# Patient Record
Sex: Male | Born: 1935 | Race: White | Hispanic: No | Marital: Married | Smoking: Former smoker
Health system: Southern US, Community
[De-identification: ages and names within clinical notes are randomized; demographics above are authoritative.]

## PROBLEM LIST (undated history)

## (undated) DIAGNOSIS — I2510 Atherosclerotic heart disease of native coronary artery without angina pectoris: Secondary | ICD-10-CM

## (undated) DIAGNOSIS — M5136 Other intervertebral disc degeneration, lumbar region: Secondary | ICD-10-CM

## (undated) DIAGNOSIS — J189 Pneumonia, unspecified organism: Secondary | ICD-10-CM

## (undated) DIAGNOSIS — D751 Secondary polycythemia: Secondary | ICD-10-CM

## (undated) DIAGNOSIS — J90 Pleural effusion, not elsewhere classified: Secondary | ICD-10-CM

## (undated) DIAGNOSIS — E039 Hypothyroidism, unspecified: Secondary | ICD-10-CM

## (undated) DIAGNOSIS — G4730 Sleep apnea, unspecified: Secondary | ICD-10-CM

## (undated) DIAGNOSIS — K219 Gastro-esophageal reflux disease without esophagitis: Secondary | ICD-10-CM

## (undated) DIAGNOSIS — G20 Parkinson's disease: Secondary | ICD-10-CM

## (undated) DIAGNOSIS — J8410 Pulmonary fibrosis, unspecified: Secondary | ICD-10-CM

## (undated) DIAGNOSIS — I480 Paroxysmal atrial fibrillation: Secondary | ICD-10-CM

## (undated) DIAGNOSIS — I5032 Chronic diastolic (congestive) heart failure: Secondary | ICD-10-CM

## (undated) DIAGNOSIS — M51369 Other intervertebral disc degeneration, lumbar region without mention of lumbar back pain or lower extremity pain: Secondary | ICD-10-CM

## (undated) DIAGNOSIS — M47812 Spondylosis without myelopathy or radiculopathy, cervical region: Secondary | ICD-10-CM

## (undated) DIAGNOSIS — M5030 Other cervical disc degeneration, unspecified cervical region: Secondary | ICD-10-CM

## (undated) DIAGNOSIS — E785 Hyperlipidemia, unspecified: Secondary | ICD-10-CM

## (undated) DIAGNOSIS — G20A1 Parkinson's disease without dyskinesia, without mention of fluctuations: Secondary | ICD-10-CM

## (undated) DIAGNOSIS — I10 Essential (primary) hypertension: Secondary | ICD-10-CM

## (undated) DIAGNOSIS — I700 Atherosclerosis of aorta: Secondary | ICD-10-CM

## (undated) DIAGNOSIS — F329 Major depressive disorder, single episode, unspecified: Secondary | ICD-10-CM

## (undated) DIAGNOSIS — D696 Thrombocytopenia, unspecified: Secondary | ICD-10-CM

## (undated) DIAGNOSIS — F32A Depression, unspecified: Secondary | ICD-10-CM

## (undated) DIAGNOSIS — I2699 Other pulmonary embolism without acute cor pulmonale: Secondary | ICD-10-CM

## (undated) HISTORY — DX: Sleep apnea, unspecified: G47.30

## (undated) HISTORY — DX: Spondylosis without myelopathy or radiculopathy, cervical region: M47.812

## (undated) HISTORY — DX: Parkinson's disease: G20

## (undated) HISTORY — DX: Paroxysmal atrial fibrillation: I48.0

## (undated) HISTORY — DX: Pulmonary fibrosis, unspecified: J84.10

## (undated) HISTORY — PX: CARDIAC CATHETERIZATION: SHX172

## (undated) HISTORY — DX: Essential (primary) hypertension: I10

## (undated) HISTORY — DX: Chronic diastolic (congestive) heart failure: I50.32

## (undated) HISTORY — DX: Other pulmonary embolism without acute cor pulmonale: I26.99

## (undated) HISTORY — DX: Secondary polycythemia: D75.1

## (undated) HISTORY — DX: Hyperlipidemia, unspecified: E78.5

## (undated) HISTORY — PX: CATARACT EXTRACTION: SUR2

## (undated) HISTORY — DX: Pleural effusion, not elsewhere classified: J90

## (undated) HISTORY — DX: Parkinson's disease without dyskinesia, without mention of fluctuations: G20.A1

## (undated) HISTORY — DX: Pneumonia, unspecified organism: J18.9

---

## 1958-04-18 HISTORY — PX: BACK SURGERY: SHX140

## 1996-04-18 HISTORY — PX: OTHER SURGICAL HISTORY: SHX169

## 1997-01-07 HISTORY — PX: CORONARY ARTERY BYPASS GRAFT: SHX141

## 2000-09-04 ENCOUNTER — Encounter: Admission: RE | Admit: 2000-09-04 | Discharge: 2000-09-04 | Payer: Self-pay | Admitting: Family Medicine

## 2000-09-05 ENCOUNTER — Encounter: Payer: Self-pay | Admitting: Sports Medicine

## 2000-09-05 ENCOUNTER — Encounter: Admission: RE | Admit: 2000-09-05 | Discharge: 2000-09-05 | Payer: Self-pay | Admitting: Sports Medicine

## 2000-09-15 ENCOUNTER — Encounter: Admission: RE | Admit: 2000-09-15 | Discharge: 2000-09-15 | Payer: Self-pay | Admitting: Sports Medicine

## 2000-10-03 ENCOUNTER — Encounter: Admission: RE | Admit: 2000-10-03 | Discharge: 2000-10-03 | Payer: Self-pay | Admitting: Sports Medicine

## 2000-10-06 ENCOUNTER — Encounter: Admission: RE | Admit: 2000-10-06 | Discharge: 2000-10-06 | Payer: Self-pay | Admitting: Family Medicine

## 2003-08-18 ENCOUNTER — Other Ambulatory Visit: Payer: Self-pay

## 2005-10-03 ENCOUNTER — Ambulatory Visit: Payer: Self-pay | Admitting: Gastroenterology

## 2006-03-07 ENCOUNTER — Ambulatory Visit: Payer: Self-pay | Admitting: Gastroenterology

## 2006-04-24 ENCOUNTER — Ambulatory Visit: Payer: Self-pay | Admitting: Gastroenterology

## 2006-10-09 ENCOUNTER — Ambulatory Visit: Payer: Self-pay | Admitting: Gastroenterology

## 2007-08-13 ENCOUNTER — Ambulatory Visit: Payer: Self-pay | Admitting: Otolaryngology

## 2008-03-04 DIAGNOSIS — C4491 Basal cell carcinoma of skin, unspecified: Secondary | ICD-10-CM

## 2008-03-04 HISTORY — DX: Basal cell carcinoma of skin, unspecified: C44.91

## 2008-04-18 HISTORY — PX: CHOLECYSTECTOMY: SHX55

## 2008-05-29 ENCOUNTER — Ambulatory Visit: Payer: Self-pay | Admitting: Gastroenterology

## 2008-06-11 ENCOUNTER — Inpatient Hospital Stay: Payer: Self-pay | Admitting: Endocrinology

## 2008-06-26 ENCOUNTER — Ambulatory Visit: Payer: Self-pay | Admitting: Endocrinology

## 2009-04-18 ENCOUNTER — Ambulatory Visit: Payer: Self-pay | Admitting: Internal Medicine

## 2009-04-18 DIAGNOSIS — I2699 Other pulmonary embolism without acute cor pulmonale: Secondary | ICD-10-CM

## 2009-04-18 HISTORY — DX: Other pulmonary embolism without acute cor pulmonale: I26.99

## 2009-05-11 ENCOUNTER — Ambulatory Visit: Payer: Self-pay | Admitting: Internal Medicine

## 2009-05-19 ENCOUNTER — Ambulatory Visit: Payer: Self-pay | Admitting: Internal Medicine

## 2009-06-16 ENCOUNTER — Ambulatory Visit: Payer: Self-pay | Admitting: Internal Medicine

## 2009-06-19 ENCOUNTER — Ambulatory Visit: Payer: Self-pay | Admitting: Internal Medicine

## 2009-07-17 ENCOUNTER — Ambulatory Visit: Payer: Self-pay | Admitting: Internal Medicine

## 2009-08-16 ENCOUNTER — Ambulatory Visit: Payer: Self-pay | Admitting: Internal Medicine

## 2009-09-16 ENCOUNTER — Ambulatory Visit: Payer: Self-pay | Admitting: Internal Medicine

## 2009-10-27 ENCOUNTER — Inpatient Hospital Stay: Payer: Self-pay | Admitting: Internal Medicine

## 2009-10-29 ENCOUNTER — Ambulatory Visit: Payer: Self-pay | Admitting: Internal Medicine

## 2009-11-16 ENCOUNTER — Ambulatory Visit: Payer: Self-pay | Admitting: Internal Medicine

## 2009-12-17 ENCOUNTER — Ambulatory Visit: Payer: Self-pay | Admitting: Internal Medicine

## 2010-01-12 ENCOUNTER — Ambulatory Visit: Payer: Self-pay | Admitting: Gastroenterology

## 2010-01-13 ENCOUNTER — Ambulatory Visit: Payer: Self-pay | Admitting: Internal Medicine

## 2010-01-13 LAB — PATHOLOGY REPORT

## 2010-01-16 ENCOUNTER — Ambulatory Visit: Payer: Self-pay | Admitting: Internal Medicine

## 2010-02-16 ENCOUNTER — Ambulatory Visit: Payer: Self-pay | Admitting: Internal Medicine

## 2010-03-18 ENCOUNTER — Ambulatory Visit: Payer: Self-pay | Admitting: Internal Medicine

## 2010-04-18 ENCOUNTER — Ambulatory Visit: Payer: Self-pay | Admitting: Internal Medicine

## 2010-05-19 ENCOUNTER — Ambulatory Visit: Payer: Self-pay | Admitting: Internal Medicine

## 2010-06-17 ENCOUNTER — Ambulatory Visit: Payer: Self-pay | Admitting: Internal Medicine

## 2010-07-28 ENCOUNTER — Ambulatory Visit: Payer: Self-pay | Admitting: Internal Medicine

## 2010-08-17 ENCOUNTER — Ambulatory Visit: Payer: Self-pay | Admitting: Internal Medicine

## 2010-09-17 ENCOUNTER — Ambulatory Visit: Payer: Self-pay | Admitting: Internal Medicine

## 2010-10-27 ENCOUNTER — Ambulatory Visit: Payer: Self-pay | Admitting: Internal Medicine

## 2010-11-17 ENCOUNTER — Ambulatory Visit: Payer: Self-pay | Admitting: Internal Medicine

## 2011-01-19 ENCOUNTER — Ambulatory Visit: Payer: Self-pay | Admitting: Internal Medicine

## 2011-02-17 ENCOUNTER — Ambulatory Visit: Payer: Self-pay | Admitting: Internal Medicine

## 2011-04-06 ENCOUNTER — Ambulatory Visit: Payer: Self-pay | Admitting: Internal Medicine

## 2011-04-19 ENCOUNTER — Ambulatory Visit: Payer: Self-pay | Admitting: Internal Medicine

## 2011-06-29 ENCOUNTER — Ambulatory Visit: Payer: Self-pay | Admitting: Internal Medicine

## 2011-06-29 LAB — CANCER CENTER HEMATOCRIT: HCT: 45.8 % (ref 40.0–52.0)

## 2011-07-18 ENCOUNTER — Ambulatory Visit: Payer: Self-pay | Admitting: Internal Medicine

## 2011-09-20 ENCOUNTER — Ambulatory Visit: Payer: Self-pay | Admitting: Internal Medicine

## 2011-09-20 LAB — CBC CANCER CENTER
Basophil #: 0 x10 3/mm (ref 0.0–0.1)
Basophil %: 0.4 %
Eosinophil #: 0.1 x10 3/mm (ref 0.0–0.7)
Eosinophil %: 0.7 %
HCT: 47.1 % (ref 40.0–52.0)
HGB: 14.7 g/dL (ref 13.0–18.0)
Lymphocyte #: 2.3 x10 3/mm (ref 1.0–3.6)
Lymphocyte %: 22.5 %
MCH: 27.3 pg (ref 26.0–34.0)
MCHC: 31.2 g/dL — ABNORMAL LOW (ref 32.0–36.0)
MCV: 88 fL (ref 80–100)
Monocyte #: 0.9 x10 3/mm (ref 0.2–1.0)
Monocyte %: 8.9 %
Neutrophil #: 6.9 x10 3/mm — ABNORMAL HIGH (ref 1.4–6.5)
Neutrophil %: 67.5 %
Platelet: 175 x10 3/mm (ref 150–440)
RBC: 5.38 10*6/uL (ref 4.40–5.90)
RDW: 16.1 % — ABNORMAL HIGH (ref 11.5–14.5)
WBC: 10.2 x10 3/mm (ref 3.8–10.6)

## 2011-09-20 LAB — HEPATIC FUNCTION PANEL A (ARMC)
Albumin: 3.7 g/dL (ref 3.4–5.0)
Alkaline Phosphatase: 133 U/L (ref 50–136)
Bilirubin, Direct: 0.1 mg/dL (ref 0.00–0.20)
Bilirubin,Total: 0.5 mg/dL (ref 0.2–1.0)
SGOT(AST): 18 U/L (ref 15–37)
SGPT (ALT): 23 U/L
Total Protein: 7.6 g/dL (ref 6.4–8.2)

## 2011-09-20 LAB — CREATININE, SERUM
Creatinine: 1.02 mg/dL (ref 0.60–1.30)
EGFR (African American): >60
EGFR (Non-African Amer.): >60

## 2011-10-17 ENCOUNTER — Ambulatory Visit: Payer: Self-pay | Admitting: Internal Medicine

## 2011-12-06 ENCOUNTER — Ambulatory Visit: Payer: Self-pay | Admitting: Family Medicine

## 2011-12-06 LAB — CREATININE, SERUM
Creatinine: 0.95 mg/dL (ref 0.60–1.30)
EGFR (African American): >60
EGFR (Non-African Amer.): >60

## 2012-01-25 ENCOUNTER — Ambulatory Visit: Payer: Self-pay | Admitting: Internal Medicine

## 2012-01-25 LAB — CANCER CENTER HEMATOCRIT: HCT: 46.9 % (ref 40.0–52.0)

## 2012-02-17 ENCOUNTER — Ambulatory Visit: Payer: Self-pay | Admitting: Internal Medicine

## 2012-04-18 ENCOUNTER — Ambulatory Visit: Payer: Self-pay | Admitting: Internal Medicine

## 2012-05-01 LAB — CANCER CENTER HEMATOCRIT: HCT: 46.2 % (ref 40.0–52.0)

## 2012-05-19 ENCOUNTER — Ambulatory Visit: Payer: Self-pay | Admitting: Internal Medicine

## 2012-08-20 ENCOUNTER — Ambulatory Visit: Payer: Self-pay | Admitting: Internal Medicine

## 2012-08-21 LAB — CBC CANCER CENTER
Basophil #: 0.1 x10 3/mm (ref 0.0–0.1)
Basophil %: 0.9 %
Eosinophil #: 0.1 x10 3/mm (ref 0.0–0.7)
Eosinophil %: 0.7 %
HCT: 42.7 % (ref 40.0–52.0)
HGB: 13.8 g/dL (ref 13.0–18.0)
Lymphocyte #: 2.1 x10 3/mm (ref 1.0–3.6)
Lymphocyte %: 17.5 %
MCH: 27 pg (ref 26.0–34.0)
MCHC: 32.2 g/dL (ref 32.0–36.0)
MCV: 84 fL (ref 80–100)
Monocyte #: 1.1 x10 3/mm — ABNORMAL HIGH (ref 0.2–1.0)
Monocyte %: 9.5 %
Neutrophil #: 8.5 x10 3/mm — ABNORMAL HIGH (ref 1.4–6.5)
Neutrophil %: 71.4 %
Platelet: 154 x10 3/mm (ref 150–440)
RBC: 5.1 10*6/uL (ref 4.40–5.90)
RDW: 16.4 % — ABNORMAL HIGH (ref 11.5–14.5)
WBC: 11.9 x10 3/mm — ABNORMAL HIGH (ref 3.8–10.6)

## 2012-09-04 ENCOUNTER — Ambulatory Visit (INDEPENDENT_AMBULATORY_CARE_PROVIDER_SITE_OTHER): Payer: Medicare Other | Admitting: Emergency Medicine

## 2012-09-04 ENCOUNTER — Encounter: Payer: Self-pay | Admitting: Emergency Medicine

## 2012-09-04 ENCOUNTER — Telehealth: Payer: Self-pay | Admitting: Emergency Medicine

## 2012-09-04 VITALS — BP 122/60 | HR 68 | Temp 97.4°F | Ht 67.0 in | Wt 265.8 lb

## 2012-09-04 DIAGNOSIS — J309 Allergic rhinitis, unspecified: Secondary | ICD-10-CM

## 2012-09-04 DIAGNOSIS — R0609 Other forms of dyspnea: Secondary | ICD-10-CM

## 2012-09-04 DIAGNOSIS — R0989 Other specified symptoms and signs involving the circulatory and respiratory systems: Secondary | ICD-10-CM

## 2012-09-04 DIAGNOSIS — R059 Cough, unspecified: Secondary | ICD-10-CM

## 2012-09-04 DIAGNOSIS — G4733 Obstructive sleep apnea (adult) (pediatric): Secondary | ICD-10-CM

## 2012-09-04 DIAGNOSIS — R0600 Dyspnea, unspecified: Secondary | ICD-10-CM

## 2012-09-04 DIAGNOSIS — R05 Cough: Secondary | ICD-10-CM

## 2012-09-04 NOTE — Telephone Encounter (Signed)
Duplicate message see earlier phone note 

## 2012-09-04 NOTE — Patient Instructions (Addendum)
Please stop pulmicort Continue ventolin 2 puffs as needed Increase fluticasone (Flonase) to 2 sprays each nostril twice a day Increase your azelastine to 2 sprays 2 -3 times a day.  Continue zyrtec.  Walking oximetry today We will perform full pulmonary function testing at your next office visit Continue your CPAP  - your settings should be 16-17. We will [facility] an order to [facility] in [place].  Follow with Dr Delton Coombes next available with full PFT.

## 2012-09-04 NOTE — Telephone Encounter (Signed)
Pt currently has an auto BiPap s8 which will not show the Insp and Esp rate on the report. Just shows the maximum pressure used.  Andre [name] is currently set on an Auto BiPap with a range of 10-18. Lisa at [facility] needs to know what pressure Dr. Delton Coombes wants her to change the BiPap rate to? Patient came in and was upset at [facility] at [facility] that she couldn't change pressure based off of our order. Lisa at [facility] wanted to know if you wanted to leave his BiPap on Auto of 10-18. Need a new order to reflect BiPap pressure setting.  Also pt currently see Dr. Andee Poles (ENT physician in [place]) who had ordered an ONO on BiPap to make sure that the patient didn't require supplemental o2 with BiPap. Misty Stanley would like to know if you wanted this arranged as well? Please advise. Andre [name]

## 2012-09-04 NOTE — Telephone Encounter (Signed)
I spoke with spouse as they called back. They state Misty Stanley won;t change the order bc it states CPAP and pt has VPAP. I advised him we are working on this and will let him know when we get everything straightened out. Spouse and pt voiced there understanding.   I spoke with Bjorn Loser. She states the pt took her machine over to [facility] and since they were not open he just left it in the tree and he left. Pt then called [facility] once they opened to let them know. Since pt is already on auto setting 10-18 does Dr. Delton Coombes still want to put pt on another auto setting 16-17 since he is already getting this pressure with his current auto setting. Please advise RB thanks

## 2012-09-04 NOTE — Telephone Encounter (Signed)
Please advise Dr. Byrum thanks 

## 2012-09-04 NOTE — Progress Notes (Signed)
Subjective:    Patient ID: Andre [name], male    DOB: 1935-08-29, 77 y.o.   MRN: [id number]  HPI 77 yo former smoker (10-15 pk-yrs), hx CAD/CABG, HTN, allergies on immunotherapy, OSA on CPAP, prior PE. He is referred by Dr Jenne Campus w ENT for dyspnea. He reports a tickle in throat and cough about 6 mo ago. He ascribed it to his allergies, started on veramyst, astelin, zyrtec. He has also had progressive SOB, especially with exertion, talking, when he is nervous. On at least one occasion he was treated for an AE with prednisone, BD's. He felt that this helped him, but then right back to same place after the pred was finished. He was also started on pulmicort + albuterol. Was seen by Dr Mayo Ao and apparently had PFT. He has also been treated for edema with lasix. He also had a cardiac eval at [facility] that he reports was reassuring. He has been gaining wt, at least 35 lbs over the last year.    Review of Systems  Constitutional: Positive for activity change and appetite change. Negative for fever and unexpected weight change.  HENT: Positive for congestion and trouble swallowing. Negative for ear pain, nosebleeds, sore throat, rhinorrhea, sneezing, dental problem, postnasal drip and sinus pressure.   Eyes: Negative for redness and itching.  Respiratory: Positive for cough, chest tightness and shortness of breath. Negative for wheezing.   Cardiovascular: Positive for leg swelling. Negative for palpitations.  Gastrointestinal: Negative for nausea and vomiting.  Genitourinary: Negative for dysuria.  Musculoskeletal: Negative for joint swelling.  Skin: Negative for rash.  Neurological: Positive for dizziness and light-headedness. Negative for headaches.  Hematological: Does not bruise/bleed easily.  Psychiatric/Behavioral: Positive for dysphoric mood. The patient is nervous/anxious.    Past Medical History  Diagnosis Date  . Sleep apnea     wears CPAP  . High cholesterol   . Sinus trouble   .  Hypertension   . Parkinson's disease     tremors  . Pulmonary embolism 2011     No family history on file.   History   Social History  . Marital Status: Married    Spouse Name: N/A    Number of Children: 2  . Years of Education: N/A   Occupational History  . Retired     Economist   Social History Main Topics  . Smoking status: Former Smoker -- 1.00 packs/day for 10 years    Types: Cigarettes, Pipe, Cigars    Quit date: 04/18/1972  . Smokeless tobacco: Former Neurosurgeon    Types: Chew    Quit date: 04/18/1972  . Alcohol Use: 0.0 oz/week     Comment: 2 drinks per day  . Drug Use: No  . Sexually Active: Not on file   Other Topics Concern  . Not on file   Social History Narrative  . No narrative on file     No Known Allergies   No outpatient prescriptions prior to visit.   No facility-administered medications prior to visit.         Objective:   Physical Exam Filed Vitals:   09/04/12 1131  BP: 122/60  Pulse: 68  Temp: 97.4 F (36.3 C)  TempSrc: Oral  Height: 5\' 7"  (1.702 m)  Weight: 265 lb 12.8 oz (120.566 kg)  SpO2: 95%   Gen: Pleasant, well-nourished, in no distress,  normal affect  ENT: No lesions,  mouth clear,  oropharynx clear, no postnasal drip  Neck: No JVD, no TMG, no  carotid bruits  Lungs: No use of accessory muscles, no dullness to percussion, clear without rales or rhonchi  Cardiovascular: RRR, heart sounds normal, no murmur or gallops, no peripheral edema  Abdomen: obese, protuberant, non-tender.   Musculoskeletal: No deformities, no cyanosis or clubbing  Neuro: alert, non focal  Skin: Warm, no lesions or rashes      Assessment & Plan:  Dyspnea Etiology unclear - has had a reassuring cardiac eval per his report. Suspect multifactorial, including UA disease w his cough, wt gain. Consider also true AFL - full PFt - walking oximetry - Attempt to treat his allergies and factor influencing cough - stop pulmicort -  Continue ventolin 2 puffs as needed - Continue your CPAP  - your settings should be 16-17. We will send an order to [facility] in [place].     Cough Increase fluticasone (Flonase) to 2 sprays each nostril twice a day Increase your azelastine to 2 sprays 2 -3 times a day.  Continue zyrtec.

## 2012-09-05 NOTE — Telephone Encounter (Signed)
The patient didn't know what he was set on. Based on the down;load that I received I needed to insure that he was set correctly. It looks like we can just leave it as is.

## 2012-09-05 NOTE — Telephone Encounter (Signed)
I called and spoke Andre [name] and made her aware.  I called pt and LMTCB X1

## 2012-09-06 ENCOUNTER — Institutional Professional Consult (permissible substitution): Payer: Self-pay | Admitting: Critical Care Medicine

## 2012-09-06 NOTE — Telephone Encounter (Signed)
LMTCBx2 to advise the pt that we will keep settings on his VPAP the same. Carron Curie, CMA

## 2012-09-07 ENCOUNTER — Telehealth: Payer: Self-pay | Admitting: Emergency Medicine

## 2012-09-07 NOTE — Telephone Encounter (Signed)
Received 1 page from [facility], sent to Dr. Delton Coombes. 09/07/12/ss

## 2012-09-07 NOTE — Telephone Encounter (Signed)
LMTCBx3. To advise the pt that we will keep settings on his vpap the same. Carron Curie, CMA

## 2012-09-10 DIAGNOSIS — G4733 Obstructive sleep apnea (adult) (pediatric): Secondary | ICD-10-CM | POA: Insufficient documentation

## 2012-09-10 DIAGNOSIS — R059 Cough, unspecified: Secondary | ICD-10-CM | POA: Insufficient documentation

## 2012-09-10 DIAGNOSIS — J309 Allergic rhinitis, unspecified: Secondary | ICD-10-CM | POA: Insufficient documentation

## 2012-09-10 DIAGNOSIS — R0600 Dyspnea, unspecified: Secondary | ICD-10-CM | POA: Insufficient documentation

## 2012-09-10 DIAGNOSIS — R05 Cough: Secondary | ICD-10-CM | POA: Insufficient documentation

## 2012-09-10 NOTE — Assessment & Plan Note (Signed)
Increase fluticasone (Flonase) to 2 sprays each nostril twice a day Increase your azelastine to 2 sprays 2 -3 times a day.  Continue zyrtec.

## 2012-09-10 NOTE — Assessment & Plan Note (Addendum)
Etiology unclear - has had a reassuring cardiac eval per his report. Suspect multifactorial, including UA disease w his cough, wt gain. Consider also true AFL - full PFt - walking oximetry - Attempt to treat his allergies and factor influencing cough - stop pulmicort - Continue ventolin 2 puffs as needed - Continue your CPAP  - your settings should be 16-17. We will send an order to [facility] in [place].

## 2012-09-11 NOTE — Telephone Encounter (Signed)
LMTCBx4. Per protocol if pt does not return call will close the message. Carron Curie, CMA

## 2012-09-11 NOTE — Telephone Encounter (Signed)
Pt returned triage's call & asked to be reached at [phone].  Andre [name]

## 2012-09-11 NOTE — Telephone Encounter (Signed)
Pt advised. Jennifer Castillo, CMA  

## 2012-09-16 ENCOUNTER — Ambulatory Visit: Payer: Self-pay | Admitting: Internal Medicine

## 2012-09-25 ENCOUNTER — Ambulatory Visit (INDEPENDENT_AMBULATORY_CARE_PROVIDER_SITE_OTHER): Payer: Medicare Other | Admitting: Emergency Medicine

## 2012-09-25 DIAGNOSIS — R0989 Other specified symptoms and signs involving the circulatory and respiratory systems: Secondary | ICD-10-CM

## 2012-09-25 DIAGNOSIS — R0609 Other forms of dyspnea: Secondary | ICD-10-CM

## 2012-09-25 DIAGNOSIS — R0600 Dyspnea, unspecified: Secondary | ICD-10-CM

## 2012-09-25 LAB — PULMONARY FUNCTION TEST

## 2012-09-25 NOTE — Progress Notes (Signed)
PFT done today. 

## 2012-09-26 ENCOUNTER — Encounter: Payer: Self-pay | Admitting: Emergency Medicine

## 2012-09-27 ENCOUNTER — Ambulatory Visit: Payer: Self-pay | Admitting: Unknown Physician Specialty

## 2012-09-28 ENCOUNTER — Encounter: Payer: Self-pay | Admitting: Emergency Medicine

## 2012-09-28 ENCOUNTER — Ambulatory Visit (INDEPENDENT_AMBULATORY_CARE_PROVIDER_SITE_OTHER): Payer: Medicare Other | Admitting: Emergency Medicine

## 2012-09-28 VITALS — BP 116/70 | HR 65 | Temp 98.1°F | Ht 66.0 in | Wt 267.4 lb

## 2012-09-28 DIAGNOSIS — R0609 Other forms of dyspnea: Secondary | ICD-10-CM

## 2012-09-28 DIAGNOSIS — G4733 Obstructive sleep apnea (adult) (pediatric): Secondary | ICD-10-CM

## 2012-09-28 DIAGNOSIS — R0989 Other specified symptoms and signs involving the circulatory and respiratory systems: Secondary | ICD-10-CM

## 2012-09-28 DIAGNOSIS — R0600 Dyspnea, unspecified: Secondary | ICD-10-CM

## 2012-09-28 NOTE — Patient Instructions (Addendum)
We will continue your veramyst and zyrtec We will not start a new inhaler at this time Continue to use albuterol as needed.  We will review your cardiac stress testing We need to confirm that your BiPAP machine has the right settings based on your recent titration study.  Follow with Dr Delton Coombes in 3 months or sooner if you have any problems.

## 2012-09-28 NOTE — Progress Notes (Signed)
Subjective:    Patient ID: Andre [name], male    DOB: April 19, 1935, 77 y.o.   MRN: [id number]  HPI 77 yo former smoker (10-15 pk-yrs), hx CAD/CABG, HTN, allergies on immunotherapy, OSA on CPAP, prior PE. He is referred by Dr Jenne Campus w ENT for dyspnea. He reports a tickle in throat and cough about 6 mo ago. He ascribed it to his allergies, started on veramyst, astelin, zyrtec. He has also had progressive SOB, especially with exertion, talking, when he is nervous. On at least one occasion he was treated for an AE with prednisone, BD's. He felt that this helped him, but then right back to same place after the pred was finished. He was also started on pulmicort + albuterol. Was seen by Dr Mayo Ao and apparently had PFT. He has also been treated for edema with lasix. He also had a cardiac eval at [facility] that he reports was reassuring. He has been gaining wt, at least 35 lbs over the last year.   ROV 09/28/12 -- f/u for SOB, cough. He has undergone nuclear stress testing and full PFT >> show mixed restriction and obstruction, volumes confirm restriction, DLCO corrects for Va. He also had a repeat BiPAP titration last night, results pending. He remains fatigued, remains SOB. His nose is less congested than last time. He is unsure whether albuterol helps him in any way.     PULMONARY FUNCTON TEST 09/28/2012  FVC 2.37  FEV1 1.82  FEV1/FVC 76.8  FVC  % Predicted 63  FEV % Predicted 67  FeF 25-75 2.37  FeF 25-75 % Predicted 3.5    Review of Systems  Constitutional: Positive for activity change and appetite change. Negative for fever and unexpected weight change.  HENT: Positive for congestion and trouble swallowing. Negative for ear pain, nosebleeds, sore throat, rhinorrhea, sneezing, dental problem, postnasal drip and sinus pressure.   Eyes: Negative for redness and itching.  Respiratory: Positive for cough, chest tightness and shortness of breath. Negative for wheezing.   Cardiovascular: Positive for  leg swelling. Negative for palpitations.  Gastrointestinal: Negative for nausea and vomiting.  Genitourinary: Negative for dysuria.  Musculoskeletal: Negative for joint swelling.  Skin: Negative for rash.  Neurological: Positive for dizziness and light-headedness. Negative for headaches.  Hematological: Does not bruise/bleed easily.  Psychiatric/Behavioral: Positive for dysphoric mood. The patient is nervous/anxious.    Past Medical History  Diagnosis Date  . Sleep apnea     wears CPAP  . High cholesterol   . Sinus trouble   . Hypertension   . Parkinson's disease     tremors  . Pulmonary embolism 2011     No family history on file.   History   Social History  . Marital Status: Married    Spouse Name: N/A    Number of Children: 2  . Years of Education: N/A   Occupational History  . Retired     Economist   Social History Main Topics  . Smoking status: Former Smoker -- 1.00 packs/day for 10 years    Types: Cigarettes, Pipe, Cigars    Quit date: 04/18/1972  . Smokeless tobacco: Former Neurosurgeon    Types: Chew    Quit date: 04/18/1972  . Alcohol Use: 0.0 oz/week     Comment: 2 drinks per day  . Drug Use: No  . Sexually Active: Not on file   Other Topics Concern  . Not on file   Social History Narrative  . No narrative on file  No Known Allergies   Outpatient Prescriptions Prior to Visit  Medication Sig Dispense Refill  . albuterol (VENTOLIN HFA) 108 (90 BASE) MCG/ACT inhaler Inhale 2 puffs into the lungs every 4 (four) hours as needed for wheezing.      [name] amLODipine (NORVASC) 10 MG tablet Take 10 mg by mouth daily.      [name] aspirin 325 MG tablet Take 325 mg by mouth daily.      [name] azelastine (ASTELIN) 137 MCG/SPRAY nasal spray Place 2 sprays into the nose daily. Use in each nostril as directed      . b complex vitamins tablet Take 1 tablet by mouth daily.      . carbidopa-levodopa (PARCOPA) 25-250 MG per disintegrating tablet Take 2 tablets by mouth 3  (three) times daily.      . cetirizine (ZYRTEC) 10 MG tablet Take 10 mg by mouth daily.      [name] esomeprazole (NEXIUM) 40 MG capsule Take 40 mg by mouth daily before breakfast.      . fluticasone (VERAMYST) 27.5 MCG/SPRAY nasal spray Place 2 sprays into the nose daily.      . hydrochlorothiazide (HYDRODIURIL) 25 MG tablet Take 25 mg by mouth daily.      [name] lovastatin (MEVACOR) 40 MG tablet Take 40 mg by mouth daily.      . metFORMIN (GLUCOPHAGE) 500 MG tablet Take 500 mg by mouth daily.      . tadalafil (CIALIS) 5 MG tablet Take 5 mg by mouth daily.      . tamsulosin (FLOMAX) 0.4 MG CAPS Take 0.4 mg by mouth daily.      . budesonide (PULMICORT) 180 MCG/ACT inhaler Inhale 2 puffs into the lungs 2 (two) times daily.      [name] FLUoxetine (PROZAC) 40 MG capsule Take 40 mg by mouth daily.       No facility-administered medications prior to visit.       Objective:   Physical Exam Filed Vitals:   09/28/12 1605  BP: 116/70  Pulse: 65  Temp: 98.1 F (36.7 C)  TempSrc: Oral  Height: 5\' 6"  (1.676 m)  Weight: 267 lb 6.4 oz (121.292 kg)  SpO2: 92%   Gen: Pleasant, well-nourished, in no distress,  normal affect  ENT: No lesions,  mouth clear,  oropharynx clear, no postnasal drip  Neck: No JVD, no TMG, no carotid bruits  Lungs: No use of accessory muscles, no dullness to percussion, clear without rales or rhonchi  Cardiovascular: RRR, heart sounds normal, no murmur or gallops, no peripheral edema  Abdomen: obese, protuberant, non-tender.   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 He does not want to start a new BD right now, will stick with albuterol prn. Suspect that nasal obstruction was part of his dyspnea as well - will  continue the allergy regimen. He has a cardiac stress test pending at [facility]. We will get the results. ROV 3 months.

## 2012-09-28 NOTE — Assessment & Plan Note (Signed)
Need to obtain BiPAP titration study to reset his machine

## 2012-09-28 NOTE — Assessment & Plan Note (Signed)
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 He does not want to start a new BD right now, will stick with albuterol prn. Suspect that nasal obstruction was part of his dyspnea as well - will continue the allergy regimen. He has a cardiac stress test pending at [facility]. We will get the results. ROV 3 months.

## 2012-10-03 ENCOUNTER — Telehealth: Payer: Self-pay | Admitting: Emergency Medicine

## 2012-10-03 NOTE — Telephone Encounter (Signed)
rec'd from [facility] forward 5 pages to [facility]

## 2012-10-30 DIAGNOSIS — E669 Obesity, unspecified: Secondary | ICD-10-CM | POA: Insufficient documentation

## 2012-10-30 DIAGNOSIS — I10 Essential (primary) hypertension: Secondary | ICD-10-CM | POA: Insufficient documentation

## 2012-10-30 DIAGNOSIS — K219 Gastro-esophageal reflux disease without esophagitis: Secondary | ICD-10-CM | POA: Insufficient documentation

## 2012-10-30 DIAGNOSIS — R002 Palpitations: Secondary | ICD-10-CM | POA: Insufficient documentation

## 2012-10-30 DIAGNOSIS — I25119 Atherosclerotic heart disease of native coronary artery with unspecified angina pectoris: Secondary | ICD-10-CM | POA: Insufficient documentation

## 2012-10-30 DIAGNOSIS — E785 Hyperlipidemia, unspecified: Secondary | ICD-10-CM | POA: Insufficient documentation

## 2012-10-30 DIAGNOSIS — I2510 Atherosclerotic heart disease of native coronary artery without angina pectoris: Secondary | ICD-10-CM | POA: Insufficient documentation

## 2012-10-30 DIAGNOSIS — Z6835 Body mass index (BMI) 35.0-35.9, adult: Secondary | ICD-10-CM | POA: Insufficient documentation

## 2012-12-05 ENCOUNTER — Telehealth: Payer: Self-pay | Admitting: Emergency Medicine

## 2012-12-05 NOTE — Telephone Encounter (Signed)
pt states he does not need to see RB at this time, and will call when ready.

## 2012-12-10 ENCOUNTER — Ambulatory Visit: Payer: Self-pay | Admitting: Internal Medicine

## 2012-12-11 LAB — CANCER CENTER HEMATOCRIT: HCT: 44.7 % (ref 40.0–52.0)

## 2012-12-17 ENCOUNTER — Ambulatory Visit: Payer: Self-pay | Admitting: Internal Medicine

## 2013-03-07 ENCOUNTER — Ambulatory Visit: Payer: Self-pay

## 2013-03-18 ENCOUNTER — Ambulatory Visit: Payer: Self-pay

## 2013-04-02 ENCOUNTER — Ambulatory Visit: Payer: Self-pay | Admitting: Internal Medicine

## 2013-04-02 LAB — CANCER CENTER HEMATOCRIT: HCT: 49.8 % (ref 40.0–52.0)

## 2013-04-18 ENCOUNTER — Ambulatory Visit: Payer: Self-pay | Admitting: Internal Medicine

## 2013-07-26 ENCOUNTER — Ambulatory Visit: Payer: Self-pay | Admitting: Internal Medicine

## 2013-07-29 LAB — CBC CANCER CENTER
Basophil #: 0.1 x10 3/mm (ref 0.0–0.1)
Basophil %: 1 %
Eosinophil #: 0.1 x10 3/mm (ref 0.0–0.7)
Eosinophil %: 1.3 %
HCT: 49.8 % (ref 40.0–52.0)
HGB: 15.7 g/dL (ref 13.0–18.0)
Lymphocyte #: 2.2 x10 3/mm (ref 1.0–3.6)
Lymphocyte %: 26.5 %
MCH: 27.5 pg (ref 26.0–34.0)
MCHC: 31.6 g/dL — ABNORMAL LOW (ref 32.0–36.0)
MCV: 87 fL (ref 80–100)
Monocyte #: 0.7 x10 3/mm (ref 0.2–1.0)
Monocyte %: 8.7 %
Neutrophil #: 5.2 x10 3/mm (ref 1.4–6.5)
Neutrophil %: 62.5 %
Platelet: 117 x10 3/mm — ABNORMAL LOW (ref 150–440)
RBC: 5.73 10*6/uL (ref 4.40–5.90)
RDW: 17 % — ABNORMAL HIGH (ref 11.5–14.5)
WBC: 8.4 x10 3/mm (ref 3.8–10.6)

## 2013-08-16 ENCOUNTER — Ambulatory Visit: Payer: Self-pay | Admitting: Internal Medicine

## 2013-08-22 ENCOUNTER — Ambulatory Visit: Payer: Self-pay | Admitting: General Practice

## 2013-09-18 ENCOUNTER — Ambulatory Visit: Payer: Self-pay | Admitting: General Practice

## 2013-10-01 DIAGNOSIS — M5030 Other cervical disc degeneration, unspecified cervical region: Secondary | ICD-10-CM | POA: Insufficient documentation

## 2013-10-01 DIAGNOSIS — M5412 Radiculopathy, cervical region: Secondary | ICD-10-CM | POA: Insufficient documentation

## 2013-10-01 DIAGNOSIS — M62838 Other muscle spasm: Secondary | ICD-10-CM | POA: Insufficient documentation

## 2013-10-01 DIAGNOSIS — M47812 Spondylosis without myelopathy or radiculopathy, cervical region: Secondary | ICD-10-CM

## 2013-10-01 DIAGNOSIS — M5136 Other intervertebral disc degeneration, lumbar region: Secondary | ICD-10-CM | POA: Insufficient documentation

## 2013-10-01 HISTORY — DX: Spondylosis without myelopathy or radiculopathy, cervical region: M47.812

## 2013-11-18 ENCOUNTER — Ambulatory Visit: Payer: Self-pay | Admitting: Internal Medicine

## 2013-11-18 LAB — CANCER CENTER HEMATOCRIT: HCT: 53.8 % — ABNORMAL HIGH (ref 40.0–52.0)

## 2013-11-18 LAB — CANCER CTR PLATELET CT: Platelet: 127 x10 3/mm — ABNORMAL LOW (ref 150–440)

## 2013-12-17 ENCOUNTER — Ambulatory Visit: Payer: Self-pay | Admitting: Internal Medicine

## 2013-12-31 DIAGNOSIS — K2270 Barrett's esophagus without dysplasia: Secondary | ICD-10-CM | POA: Insufficient documentation

## 2014-01-28 DIAGNOSIS — M62830 Muscle spasm of back: Secondary | ICD-10-CM | POA: Insufficient documentation

## 2014-03-10 ENCOUNTER — Ambulatory Visit: Payer: Self-pay | Admitting: Internal Medicine

## 2014-03-10 LAB — CANCER CTR PLATELET CT: Platelet: 133 x10 3/mm — ABNORMAL LOW (ref 150–440)

## 2014-03-10 LAB — CANCER CENTER HEMATOCRIT: HCT: 52.9 % — ABNORMAL HIGH (ref 40.0–52.0)

## 2014-03-17 LAB — CANCER CENTER HEMATOCRIT: HCT: 51 % (ref 40.0–52.0)

## 2014-03-18 ENCOUNTER — Ambulatory Visit: Payer: Self-pay | Admitting: Internal Medicine

## 2014-03-31 LAB — CANCER CENTER HEMATOCRIT: HCT: 45.4 % (ref 40.0–52.0)

## 2014-04-18 ENCOUNTER — Ambulatory Visit: Payer: Self-pay | Admitting: Internal Medicine

## 2014-04-28 LAB — CANCER CENTER HEMATOCRIT: HCT: 50 % (ref 40.0–52.0)

## 2014-05-19 ENCOUNTER — Ambulatory Visit: Payer: Self-pay | Admitting: Internal Medicine

## 2014-05-26 DIAGNOSIS — R351 Nocturia: Secondary | ICD-10-CM | POA: Insufficient documentation

## 2014-05-26 DIAGNOSIS — R350 Frequency of micturition: Secondary | ICD-10-CM | POA: Insufficient documentation

## 2014-05-26 LAB — CANCER CENTER HEMATOCRIT: HCT: 47 % (ref 40.0–52.0)

## 2014-06-17 ENCOUNTER — Ambulatory Visit
Admit: 2014-06-17 | Disposition: A | Discharge status: Home / Self Care | Payer: Self-pay | Attending: Internal Medicine | Admitting: Internal Medicine

## 2014-07-18 ENCOUNTER — Ambulatory Visit
Admit: 2014-07-18 | Disposition: A | Discharge status: Home / Self Care | Payer: Self-pay | Attending: Internal Medicine | Admitting: Internal Medicine

## 2014-07-30 ENCOUNTER — Ambulatory Visit
Admit: 2014-07-30 | Disposition: A | Discharge status: Home / Self Care | Payer: Self-pay | Attending: Gastroenterology | Admitting: Gastroenterology

## 2014-08-08 LAB — CANCER CENTER HEMATOCRIT: HCT: 45 % (ref 40.0–52.0)

## 2014-08-12 ENCOUNTER — Ambulatory Visit
Admit: 2014-08-12 | Disposition: A | Discharge status: Home / Self Care | Payer: Self-pay | Attending: Gastroenterology | Admitting: Gastroenterology

## 2014-08-12 LAB — CBC WITH DIFFERENTIAL/PLATELET
Basophil #: 0.1 10*3/uL (ref 0.0–0.1)
Basophil %: 0.8 %
Eosinophil #: 0.1 10*3/uL (ref 0.0–0.7)
Eosinophil %: 1 %
HCT: 44.5 % (ref 40.0–52.0)
HGB: 14.3 g/dL (ref 13.0–18.0)
Lymphocyte #: 1.6 10*3/uL (ref 1.0–3.6)
Lymphocyte %: 21.1 %
MCH: 27.5 pg (ref 26.0–34.0)
MCHC: 32.2 g/dL (ref 32.0–36.0)
MCV: 86 fL (ref 80–100)
Monocyte #: 0.8 x10 3/mm (ref 0.2–1.0)
Monocyte %: 10.7 %
Neutrophil #: 5 10*3/uL (ref 1.4–6.5)
Neutrophil %: 66.4 %
Platelet: 155 10*3/uL (ref 150–440)
RBC: 5.2 10*6/uL (ref 4.40–5.90)
RDW: 14.8 % — ABNORMAL HIGH (ref 11.5–14.5)
WBC: 7.6 10*3/uL (ref 3.8–10.6)

## 2014-08-12 LAB — PROTIME-INR
INR: 1
Prothrombin Time: 13.1 secs

## 2014-08-13 LAB — SURGICAL PATHOLOGY

## 2014-09-22 ENCOUNTER — Other Ambulatory Visit: Payer: Self-pay

## 2014-09-22 ENCOUNTER — Other Ambulatory Visit: Payer: Medicare Other

## 2014-09-22 DIAGNOSIS — D751 Secondary polycythemia: Secondary | ICD-10-CM

## 2014-09-23 ENCOUNTER — Inpatient Hospital Stay: Payer: PPO

## 2014-09-23 ENCOUNTER — Encounter (INDEPENDENT_AMBULATORY_CARE_PROVIDER_SITE_OTHER): Payer: Self-pay

## 2014-09-23 ENCOUNTER — Inpatient Hospital Stay: Payer: PPO | Attending: Internal Medicine

## 2014-09-23 DIAGNOSIS — Z86711 Personal history of pulmonary embolism: Secondary | ICD-10-CM | POA: Diagnosis not present

## 2014-09-23 DIAGNOSIS — Z79899 Other long term (current) drug therapy: Secondary | ICD-10-CM | POA: Diagnosis not present

## 2014-09-23 DIAGNOSIS — D696 Thrombocytopenia, unspecified: Secondary | ICD-10-CM | POA: Diagnosis not present

## 2014-09-23 DIAGNOSIS — D751 Secondary polycythemia: Secondary | ICD-10-CM | POA: Diagnosis not present

## 2014-09-23 LAB — HEMATOCRIT: HCT: 46.2 % (ref 40.0–52.0)

## 2014-10-30 ENCOUNTER — Inpatient Hospital Stay: Payer: PPO

## 2014-10-30 ENCOUNTER — Inpatient Hospital Stay: Payer: PPO | Attending: Internal Medicine

## 2014-10-30 ENCOUNTER — Other Ambulatory Visit: Payer: Self-pay

## 2014-10-30 DIAGNOSIS — D696 Thrombocytopenia, unspecified: Secondary | ICD-10-CM | POA: Insufficient documentation

## 2014-10-30 DIAGNOSIS — Z86711 Personal history of pulmonary embolism: Secondary | ICD-10-CM | POA: Insufficient documentation

## 2014-10-30 DIAGNOSIS — Z79899 Other long term (current) drug therapy: Secondary | ICD-10-CM | POA: Diagnosis not present

## 2014-10-30 DIAGNOSIS — D751 Secondary polycythemia: Secondary | ICD-10-CM | POA: Insufficient documentation

## 2014-10-30 LAB — HEMATOCRIT: HCT: 43.9 % (ref 40.0–52.0)

## 2014-11-03 ENCOUNTER — Other Ambulatory Visit: Payer: PPO

## 2014-11-14 ENCOUNTER — Other Ambulatory Visit: Payer: Self-pay | Admitting: Internal Medicine

## 2014-11-14 ENCOUNTER — Ambulatory Visit
Admission: RE | Admit: 2014-11-14 | Discharge: 2014-11-14 | Disposition: A | Discharge status: Home / Self Care | Payer: PPO | Source: Ambulatory Visit | Attending: Internal Medicine | Admitting: Internal Medicine

## 2014-11-14 DIAGNOSIS — N281 Cyst of kidney, acquired: Secondary | ICD-10-CM | POA: Diagnosis not present

## 2014-11-14 DIAGNOSIS — K5790 Diverticulosis of intestine, part unspecified, without perforation or abscess without bleeding: Secondary | ICD-10-CM | POA: Insufficient documentation

## 2014-11-14 DIAGNOSIS — I2510 Atherosclerotic heart disease of native coronary artery without angina pectoris: Secondary | ICD-10-CM | POA: Insufficient documentation

## 2014-11-14 DIAGNOSIS — R1012 Left upper quadrant pain: Secondary | ICD-10-CM | POA: Diagnosis not present

## 2014-11-14 MED ORDER — IOHEXOL 300 MG/ML  SOLN
100.0000 mL | Freq: Once | INTRAMUSCULAR | Status: AC | PRN
  Administered 2014-11-14: 100 mL via INTRAVENOUS

## 2014-12-08 DIAGNOSIS — E119 Type 2 diabetes mellitus without complications: Secondary | ICD-10-CM | POA: Insufficient documentation

## 2014-12-08 DIAGNOSIS — I700 Atherosclerosis of aorta: Secondary | ICD-10-CM | POA: Insufficient documentation

## 2014-12-15 ENCOUNTER — Inpatient Hospital Stay: Payer: PPO | Attending: Internal Medicine

## 2014-12-15 ENCOUNTER — Inpatient Hospital Stay: Payer: PPO

## 2014-12-15 ENCOUNTER — Other Ambulatory Visit: Payer: Self-pay | Admitting: *Deleted

## 2014-12-15 DIAGNOSIS — Z79899 Other long term (current) drug therapy: Secondary | ICD-10-CM | POA: Insufficient documentation

## 2014-12-15 DIAGNOSIS — Z86711 Personal history of pulmonary embolism: Secondary | ICD-10-CM | POA: Insufficient documentation

## 2014-12-15 DIAGNOSIS — D696 Thrombocytopenia, unspecified: Secondary | ICD-10-CM | POA: Diagnosis not present

## 2014-12-15 DIAGNOSIS — D751 Secondary polycythemia: Secondary | ICD-10-CM | POA: Diagnosis not present

## 2014-12-15 LAB — HEMATOCRIT: HCT: 44.8 % (ref 40.0–52.0)

## 2014-12-30 ENCOUNTER — Other Ambulatory Visit: Payer: Self-pay | Admitting: Physical Medicine and Rehabilitation

## 2014-12-30 DIAGNOSIS — M5412 Radiculopathy, cervical region: Secondary | ICD-10-CM

## 2015-01-08 ENCOUNTER — Ambulatory Visit
Admission: RE | Admit: 2015-01-08 | Discharge: 2015-01-08 | Disposition: A | Discharge status: Home / Self Care | Payer: PPO | Source: Ambulatory Visit | Attending: Physical Medicine and Rehabilitation | Admitting: Physical Medicine and Rehabilitation

## 2015-01-08 DIAGNOSIS — M5412 Radiculopathy, cervical region: Secondary | ICD-10-CM | POA: Diagnosis not present

## 2015-01-08 DIAGNOSIS — M4802 Spinal stenosis, cervical region: Secondary | ICD-10-CM | POA: Diagnosis not present

## 2015-01-26 ENCOUNTER — Inpatient Hospital Stay: Payer: PPO | Attending: Family Medicine

## 2015-01-26 ENCOUNTER — Inpatient Hospital Stay (HOSPITAL_BASED_OUTPATIENT_CLINIC_OR_DEPARTMENT_OTHER): Payer: PPO | Admitting: Family Medicine

## 2015-01-26 ENCOUNTER — Inpatient Hospital Stay: Payer: PPO

## 2015-01-26 VITALS — BP 160/83 | HR 64 | Temp 95.2°F | Wt 258.1 lb

## 2015-01-26 DIAGNOSIS — D751 Secondary polycythemia: Secondary | ICD-10-CM

## 2015-01-26 DIAGNOSIS — R162 Hepatomegaly with splenomegaly, not elsewhere classified: Secondary | ICD-10-CM | POA: Diagnosis not present

## 2015-01-26 DIAGNOSIS — M50222 Other cervical disc displacement at C5-C6 level: Secondary | ICD-10-CM | POA: Diagnosis not present

## 2015-01-26 DIAGNOSIS — Z7984 Long term (current) use of oral hypoglycemic drugs: Secondary | ICD-10-CM | POA: Diagnosis not present

## 2015-01-26 DIAGNOSIS — E7800 Pure hypercholesterolemia, unspecified: Secondary | ICD-10-CM | POA: Insufficient documentation

## 2015-01-26 DIAGNOSIS — E119 Type 2 diabetes mellitus without complications: Secondary | ICD-10-CM | POA: Diagnosis not present

## 2015-01-26 DIAGNOSIS — Z79899 Other long term (current) drug therapy: Secondary | ICD-10-CM | POA: Diagnosis not present

## 2015-01-26 DIAGNOSIS — G4730 Sleep apnea, unspecified: Secondary | ICD-10-CM | POA: Insufficient documentation

## 2015-01-26 DIAGNOSIS — I10 Essential (primary) hypertension: Secondary | ICD-10-CM | POA: Diagnosis not present

## 2015-01-26 DIAGNOSIS — G20 Parkinson's disease: Secondary | ICD-10-CM | POA: Insufficient documentation

## 2015-01-26 DIAGNOSIS — Z7982 Long term (current) use of aspirin: Secondary | ICD-10-CM | POA: Diagnosis not present

## 2015-01-26 DIAGNOSIS — Z87891 Personal history of nicotine dependence: Secondary | ICD-10-CM | POA: Insufficient documentation

## 2015-01-26 DIAGNOSIS — D696 Thrombocytopenia, unspecified: Secondary | ICD-10-CM

## 2015-01-26 DIAGNOSIS — Z86711 Personal history of pulmonary embolism: Secondary | ICD-10-CM | POA: Diagnosis not present

## 2015-01-26 LAB — CBC WITH DIFFERENTIAL/PLATELET
Basophils Absolute: 0.1 10*3/uL (ref 0–0.1)
Basophils Relative: 1 %
Eosinophils Absolute: 0.1 10*3/uL (ref 0–0.7)
Eosinophils Relative: 1 %
HCT: 43.9 % (ref 40.0–52.0)
Hemoglobin: 14 g/dL (ref 13.0–18.0)
Lymphocytes Relative: 21 %
Lymphs Abs: 2.1 10*3/uL (ref 1.0–3.6)
MCH: 25 pg — ABNORMAL LOW (ref 26.0–34.0)
MCHC: 31.9 g/dL — ABNORMAL LOW (ref 32.0–36.0)
MCV: 78.1 fL — ABNORMAL LOW (ref 80.0–100.0)
Monocytes Absolute: 0.8 10*3/uL (ref 0.2–1.0)
Monocytes Relative: 9 %
Neutro Abs: 6.7 10*3/uL — ABNORMAL HIGH (ref 1.4–6.5)
Neutrophils Relative %: 68 %
Platelets: 136 10*3/uL — ABNORMAL LOW (ref 150–440)
RBC: 5.62 MIL/uL (ref 4.40–5.90)
RDW: 16.6 % — ABNORMAL HIGH (ref 11.5–14.5)
WBC: 9.8 10*3/uL (ref 3.8–10.6)

## 2015-01-26 NOTE — Progress Notes (Signed)
Patient here for follow up appointment and possible phlebotomy.  Patient states he is needing surgery on his neck but is concerned about his blood disorder.

## 2015-01-26 NOTE — Progress Notes (Signed)
[place]  [phone]  [phone]) [phone]     EASTYN DATTILO DOB: [birth year]  MR#: [id number]  KGM#:[id number]  Patient Care Team: Kirk Ruths, MD as PCP - General (Internal Medicine)  CHIEF COMPLAINT:  Chief Complaint  Patient presents with  . OTHER  1. 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 May 11, 2009 - Hematocrit 52.2, Hb 17.8, WBC 9700, platelets 165 take, EPO 5.5, COHb 1.6%, [id number] mutation negative. 2. Episode of pulmonary embolism February 2011 post surgery after getting cholecystectomy at [facility] in [place], [place]. Status post Coumadin therapy.   INTERVAL HISTORY:  Patient is here for continued follow-up regarding secondary erythrocytosis. Patient was last seen in March 2016 by Dr. Ma Hillock. Patient has a significant past medical history to include pulmonary embolisms in 2011 as well as some mild thrombocytopenia. He reports today feeling very well overall except for pain in his neck which has been recently evaluated. Patient has been advised that surgery may ease pain and discomfort. States that he has spinal stenosis at C5-C6 secondary to spondylosis with severe foraminal narrowing bilaterally, patient also reports having bulging disc. He overall feels very well other than neck pain.  REVIEW OF SYSTEMS:   Review of Systems  Constitutional: Negative for fever, chills, weight loss, malaise/fatigue and diaphoresis.  HENT: Negative for congestion, ear discharge, ear pain, hearing loss, nosebleeds, sore throat and tinnitus.   Eyes: Negative for blurred vision, double vision, photophobia, pain, discharge and redness.  Respiratory: Negative for cough, hemoptysis, sputum production, shortness of breath, wheezing and stridor.   Cardiovascular: Negative for chest pain, palpitations, orthopnea, claudication, leg swelling and PND.  Gastrointestinal: Negative for heartburn,  nausea, vomiting, abdominal pain, diarrhea, constipation, blood in stool and melena.  Genitourinary: Negative.   Musculoskeletal: Positive for neck pain.  Skin: Negative.   Neurological: Negative for dizziness, tingling, focal weakness, seizures, weakness and headaches.  Endo/Heme/Allergies: Does not bruise/bleed easily.  Psychiatric/Behavioral: Negative for depression. The patient is not nervous/anxious and does not have insomnia.     As per HPI. Otherwise, a complete review of systems is negatve.   PAST MEDICAL HISTORY: Past Medical History  Diagnosis Date  . Sleep apnea     wears CPAP  . High cholesterol   . Sinus trouble   . Hypertension   . Parkinson's disease     tremors  . Pulmonary embolism 2011  . Diabetes mellitus without complication     PAST SURGICAL HISTORY: Past Surgical History  Procedure Laterality Date  . Other surgical history  1998    Bypass    FAMILY HISTORY No family history on file.  GYNECOLOGIC HISTORY:  No LMP for male patient.     ADVANCED DIRECTIVES:    HEALTH MAINTENANCE: Social History  Substance Use Topics  . Smoking status: Former Smoker -- 1.00 packs/day for 10 years    Types: Cigarettes, Pipe, Cigars    Quit date: 04/18/1972  . Smokeless tobacco: Former Systems developer    Types: [place] date: 04/18/1972  . Alcohol Use: 0.0 oz/week     Comment: 2 drinks per day     Colonoscopy:  PAP:  Bone density:  Lipid panel:  Allergies  Allergen Reactions  . Pravastatin Other (See Comments)  . Prednisone Other (See Comments)    Pt states that med makes him hyper Pt states that med makes him hyper    Current Outpatient Prescriptions  Medication Sig Dispense  Refill  . albuterol (VENTOLIN HFA) 108 (90 BASE) MCG/ACT inhaler Inhale 2 puffs into the lungs every 4 (four) hours as needed for wheezing.    [name] amLODipine (NORVASC) 10 MG tablet Take 10 mg by mouth daily.    [name] aspirin 325 MG tablet Take 325 mg by mouth daily.    [name] azelastine  (ASTELIN) 137 MCG/SPRAY nasal spray Place 2 sprays into the nose daily. Use in each nostril as directed    . b complex vitamins tablet Take 1 tablet by mouth daily.    . budesonide (PULMICORT) 180 MCG/ACT inhaler Inhale 2 puffs into the lungs 2 (two) times daily.    [name] buPROPion (WELLBUTRIN XL) 150 MG 24 hr tablet TAKE ONE TABLET EVERY DAY    . carbidopa-levodopa (PARCOPA) 25-250 MG per disintegrating tablet Take 2 tablets by mouth 3 (three) times daily.    . carbidopa-levodopa (SINEMET IR) 25-250 MG tablet Take 1 tablet q3hrs, 6 times daily as directed    . cetirizine (ZYRTEC) 10 MG tablet Take 10 mg by mouth daily.    . cyclobenzaprine (FLEXERIL) 5 MG tablet 1 po tid prn    . DULoxetine (CYMBALTA) 30 MG capsule Take 30 mg by mouth daily.    [name] EPINEPHrine 0.3 mg/0.3 mL IJ SOAJ injection     . esomeprazole (NEXIUM) 40 MG capsule Take 40 mg by mouth daily before breakfast.    . esomeprazole (NEXIUM) 40 MG capsule Take 40 mg by mouth.    . fluticasone (VERAMYST) 27.5 MCG/SPRAY nasal spray Place 2 sprays into the nose daily.    . hydrochlorothiazide (HYDRODIURIL) 25 MG tablet Take 25 mg by mouth daily.    [name] lovastatin (MEVACOR) 40 MG tablet Take 40 mg by mouth daily.    . metFORMIN (GLUCOPHAGE) 500 MG tablet Take 500 mg by mouth daily.    . metoprolol succinate (TOPROL-XL) 25 MG 24 hr tablet Take 12.5 mg by mouth.    . mirabegron ER (MYRBETRIQ) 50 MG TB24 tablet Take 50 mg by mouth.    . oxyCODONE-acetaminophen (PERCOCET/ROXICET) 5-325 MG tablet 1 po bid prn Earliest Fill Date: 11/29/14    . pravastatin (PRAVACHOL) 20 MG tablet Take 20 mg by mouth.    . tadalafil (CIALIS) 5 MG tablet Take 5 mg by mouth daily.    . tadalafil (CIALIS) 5 MG tablet Take 5 mg by mouth.    . tamsulosin (FLOMAX) 0.4 MG CAPS Take 0.4 mg by mouth daily.    . Testosterone 10 MG/ACT (2%) GEL Apply topically.    . verapamil (CALAN-SR) 240 MG CR tablet Take 240 mg by mouth.     No current facility-administered medications  for this visit.    OBJECTIVE: BP 160/83 mmHg  Pulse 64  Temp(Src) 95.2 F (35.1 C) (Tympanic)  Wt 258 lb 2 oz (117.085 kg)   Body mass index is 40.42 kg/(m^2).    ECOG FS:1 - Symptomatic but completely ambulatory  General: Well-developed, well-nourished, no acute distress. Lungs: Clear to auscultation bilaterally. Heart: Regular rate and rhythm. No rubs, murmurs, or gallops. Abdomen: Soft, nontender, nondistended. No organomegaly noted, normoactive bowel sounds. Musculoskeletal: Moderate to severe neck pain. No edema, cyanosis, or clubbing. Neuro: Alert, answering all questions appropriately. Cranial nerves grossly intact. Skin: No rashes or petechiae noted. Psych: Normal affect.   LAB RESULTS:  Appointment on 01/26/2015  Component Date Value Ref Range Status  . WBC 01/26/2015 9.8  3.8 - 10.6 K/uL Final  . RBC 01/26/2015 5.62  4.40 -  5.90 MIL/uL Final  . Hemoglobin 01/26/2015 14.0  13.0 - 18.0 g/dL Final  . HCT 01/26/2015 43.9  40.0 - 52.0 % Final  . MCV 01/26/2015 78.1* 80.0 - 100.0 fL Final  . MCH 01/26/2015 25.0* 26.0 - 34.0 pg Final  . MCHC 01/26/2015 31.9* 32.0 - 36.0 g/dL Final  . RDW 01/26/2015 16.6* 11.5 - 14.5 % Final  . Platelets 01/26/2015 136* 150 - 440 K/uL Final  . Neutrophils Relative % 01/26/2015 68   Final  . Neutro Abs 01/26/2015 6.7* 1.4 - 6.5 K/uL Final  . Lymphocytes Relative 01/26/2015 21   Final  . Lymphs Abs 01/26/2015 2.1  1.0 - 3.6 K/uL Final  . Monocytes Relative 01/26/2015 9   Final  . Monocytes Absolute 01/26/2015 0.8  0.2 - 1.0 K/uL Final  . Eosinophils Relative 01/26/2015 1   Final  . Eosinophils Absolute 01/26/2015 0.1  0 - 0.7 K/uL Final  . Basophils Relative 01/26/2015 1   Final  . Basophils Absolute 01/26/2015 0.1  0 - 0.1 K/uL Final    STUDIES: No results found.  ASSESSMENT:  Secondary erythrocytosis. Mild thrombocytopenia. History of PE.  PLAN:   1. Secondary erythrocytosis. Patient's hematocrit today is 43.9. Previously,  goals sets for less than 46%. If hematocrit is higher than 46 and phlebotomy of 350 Mls to be performed. He does not require phlebotomy today. 2. Mild thrombocytopenia. Platelet count today 136K. Patient with history of routine alcohol intake. Ultrasound of the abdomen from 2015 reported hepatosplenomegaly. Patient states that functions are being monitored by primary physician. Advised patient to quit drinking alcohol. 3. History of PE. Patient with a postoperative PET in February 2011 following cholecystectomy. Status post Coumadin therapy. No evidence of any recurrent DVT or PE at this time.  We'll continue with routine follow-up every 6 weeks for evaluation of hematocrit. We'll continue with routine medical follow-up in 6 months  Patient expressed understanding and was in agreement with this plan. He also understands that He can call clinic at any time with any questions, concerns, or complaints.   Dr. Oliva Bustard was available for consultation and review of plan of care for this patient.  Evlyn Kanner, NP   01/26/2015 2:42 PM

## 2015-01-28 ENCOUNTER — Other Ambulatory Visit: Payer: Self-pay | Admitting: Family Medicine

## 2015-01-28 ENCOUNTER — Encounter: Payer: Self-pay | Admitting: Family Medicine

## 2015-01-28 DIAGNOSIS — D751 Secondary polycythemia: Secondary | ICD-10-CM | POA: Insufficient documentation

## 2015-01-28 HISTORY — DX: Secondary polycythemia: D75.1

## 2015-03-09 ENCOUNTER — Inpatient Hospital Stay: Payer: PPO

## 2015-03-09 ENCOUNTER — Inpatient Hospital Stay: Payer: PPO | Attending: Family Medicine

## 2015-03-09 DIAGNOSIS — D696 Thrombocytopenia, unspecified: Secondary | ICD-10-CM | POA: Diagnosis not present

## 2015-03-09 DIAGNOSIS — D751 Secondary polycythemia: Secondary | ICD-10-CM | POA: Diagnosis present

## 2015-03-09 LAB — HEMATOCRIT: HCT: 45 % (ref 40.0–52.0)

## 2015-03-19 DIAGNOSIS — C4492 Squamous cell carcinoma of skin, unspecified: Secondary | ICD-10-CM

## 2015-03-19 HISTORY — DX: Squamous cell carcinoma of skin, unspecified: C44.92

## 2015-04-20 ENCOUNTER — Inpatient Hospital Stay: Payer: PPO

## 2015-04-23 ENCOUNTER — Other Ambulatory Visit: Payer: Self-pay | Admitting: Family Medicine

## 2015-04-23 ENCOUNTER — Inpatient Hospital Stay: Payer: PPO | Attending: Internal Medicine

## 2015-04-23 ENCOUNTER — Inpatient Hospital Stay: Payer: PPO

## 2015-04-23 DIAGNOSIS — D751 Secondary polycythemia: Secondary | ICD-10-CM | POA: Diagnosis not present

## 2015-04-23 DIAGNOSIS — D696 Thrombocytopenia, unspecified: Secondary | ICD-10-CM | POA: Insufficient documentation

## 2015-04-23 LAB — HEMATOCRIT: HCT: 45.8 % (ref 40.0–52.0)

## 2015-04-29 DIAGNOSIS — E119 Type 2 diabetes mellitus without complications: Secondary | ICD-10-CM | POA: Diagnosis not present

## 2015-04-29 DIAGNOSIS — I2510 Atherosclerotic heart disease of native coronary artery without angina pectoris: Secondary | ICD-10-CM | POA: Diagnosis not present

## 2015-04-30 DIAGNOSIS — J301 Allergic rhinitis due to pollen: Secondary | ICD-10-CM | POA: Diagnosis not present

## 2015-05-06 DIAGNOSIS — E119 Type 2 diabetes mellitus without complications: Secondary | ICD-10-CM | POA: Diagnosis not present

## 2015-05-06 DIAGNOSIS — G20 Parkinson's disease: Secondary | ICD-10-CM | POA: Diagnosis not present

## 2015-05-06 DIAGNOSIS — Z6841 Body Mass Index (BMI) 40.0 and over, adult: Secondary | ICD-10-CM | POA: Diagnosis not present

## 2015-05-06 DIAGNOSIS — Z0000 Encounter for general adult medical examination without abnormal findings: Secondary | ICD-10-CM | POA: Diagnosis not present

## 2015-05-06 DIAGNOSIS — I2510 Atherosclerotic heart disease of native coronary artery without angina pectoris: Secondary | ICD-10-CM | POA: Diagnosis not present

## 2015-05-06 DIAGNOSIS — I10 Essential (primary) hypertension: Secondary | ICD-10-CM | POA: Diagnosis not present

## 2015-05-06 DIAGNOSIS — Z7189 Other specified counseling: Secondary | ICD-10-CM | POA: Insufficient documentation

## 2015-05-07 DIAGNOSIS — J301 Allergic rhinitis due to pollen: Secondary | ICD-10-CM | POA: Diagnosis not present

## 2015-05-21 DIAGNOSIS — H6123 Impacted cerumen, bilateral: Secondary | ICD-10-CM | POA: Diagnosis not present

## 2015-05-29 DIAGNOSIS — J301 Allergic rhinitis due to pollen: Secondary | ICD-10-CM | POA: Diagnosis not present

## 2015-06-01 ENCOUNTER — Inpatient Hospital Stay: Payer: PPO | Attending: Family Medicine

## 2015-06-01 ENCOUNTER — Inpatient Hospital Stay: Payer: PPO

## 2015-06-01 DIAGNOSIS — D751 Secondary polycythemia: Secondary | ICD-10-CM | POA: Diagnosis not present

## 2015-06-01 DIAGNOSIS — J301 Allergic rhinitis due to pollen: Secondary | ICD-10-CM | POA: Diagnosis not present

## 2015-06-01 LAB — HEMATOCRIT: HCT: 44.9 % (ref 40.0–52.0)

## 2015-06-08 DIAGNOSIS — J301 Allergic rhinitis due to pollen: Secondary | ICD-10-CM | POA: Diagnosis not present

## 2015-06-09 ENCOUNTER — Other Ambulatory Visit: Payer: Self-pay | Admitting: Physician Assistant

## 2015-06-09 ENCOUNTER — Ambulatory Visit
Admission: RE | Admit: 2015-06-09 | Discharge: 2015-06-09 | Disposition: A | Discharge status: Home / Self Care | Payer: PPO | Source: Ambulatory Visit | Attending: Physician Assistant | Admitting: Physician Assistant

## 2015-06-09 DIAGNOSIS — R918 Other nonspecific abnormal finding of lung field: Secondary | ICD-10-CM | POA: Insufficient documentation

## 2015-06-09 DIAGNOSIS — I2510 Atherosclerotic heart disease of native coronary artery without angina pectoris: Secondary | ICD-10-CM | POA: Diagnosis not present

## 2015-06-09 DIAGNOSIS — R0602 Shortness of breath: Secondary | ICD-10-CM | POA: Insufficient documentation

## 2015-06-09 DIAGNOSIS — G4733 Obstructive sleep apnea (adult) (pediatric): Secondary | ICD-10-CM | POA: Diagnosis not present

## 2015-06-09 DIAGNOSIS — Z9989 Dependence on other enabling machines and devices: Secondary | ICD-10-CM | POA: Diagnosis not present

## 2015-06-09 LAB — POCT I-STAT CREATININE: Creatinine, Ser: 1.1 mg/dL (ref 0.61–1.24)

## 2015-06-09 MED ORDER — IOHEXOL 350 MG/ML SOLN
75.0000 mL | Freq: Once | INTRAVENOUS | Status: AC | PRN
Start: 2015-06-09 — End: 2015-06-09
  Administered 2015-06-09: 75 mL via INTRAVENOUS

## 2015-06-10 DIAGNOSIS — E119 Type 2 diabetes mellitus without complications: Secondary | ICD-10-CM | POA: Diagnosis not present

## 2015-06-10 DIAGNOSIS — K219 Gastro-esophageal reflux disease without esophagitis: Secondary | ICD-10-CM | POA: Diagnosis not present

## 2015-06-10 DIAGNOSIS — I480 Paroxysmal atrial fibrillation: Secondary | ICD-10-CM | POA: Diagnosis not present

## 2015-06-10 DIAGNOSIS — R5383 Other fatigue: Secondary | ICD-10-CM | POA: Diagnosis not present

## 2015-06-10 DIAGNOSIS — E784 Other hyperlipidemia: Secondary | ICD-10-CM | POA: Diagnosis not present

## 2015-06-10 DIAGNOSIS — I10 Essential (primary) hypertension: Secondary | ICD-10-CM | POA: Diagnosis not present

## 2015-06-10 DIAGNOSIS — R011 Cardiac murmur, unspecified: Secondary | ICD-10-CM | POA: Diagnosis not present

## 2015-06-10 DIAGNOSIS — R0602 Shortness of breath: Secondary | ICD-10-CM | POA: Diagnosis not present

## 2015-06-10 DIAGNOSIS — I6529 Occlusion and stenosis of unspecified carotid artery: Secondary | ICD-10-CM | POA: Diagnosis not present

## 2015-06-10 DIAGNOSIS — G4733 Obstructive sleep apnea (adult) (pediatric): Secondary | ICD-10-CM | POA: Diagnosis not present

## 2015-06-10 DIAGNOSIS — G20 Parkinson's disease: Secondary | ICD-10-CM | POA: Diagnosis not present

## 2015-06-15 DIAGNOSIS — J301 Allergic rhinitis due to pollen: Secondary | ICD-10-CM | POA: Diagnosis not present

## 2015-06-16 DIAGNOSIS — M5030 Other cervical disc degeneration, unspecified cervical region: Secondary | ICD-10-CM | POA: Diagnosis not present

## 2015-06-16 DIAGNOSIS — R011 Cardiac murmur, unspecified: Secondary | ICD-10-CM | POA: Diagnosis not present

## 2015-06-16 DIAGNOSIS — M4802 Spinal stenosis, cervical region: Secondary | ICD-10-CM | POA: Insufficient documentation

## 2015-06-16 DIAGNOSIS — I480 Paroxysmal atrial fibrillation: Secondary | ICD-10-CM | POA: Diagnosis not present

## 2015-06-16 DIAGNOSIS — M5412 Radiculopathy, cervical region: Secondary | ICD-10-CM | POA: Diagnosis not present

## 2015-06-16 DIAGNOSIS — R0602 Shortness of breath: Secondary | ICD-10-CM | POA: Diagnosis not present

## 2015-07-01 DIAGNOSIS — I482 Chronic atrial fibrillation, unspecified: Secondary | ICD-10-CM | POA: Insufficient documentation

## 2015-07-02 DIAGNOSIS — J301 Allergic rhinitis due to pollen: Secondary | ICD-10-CM | POA: Diagnosis not present

## 2015-07-08 DIAGNOSIS — E669 Obesity, unspecified: Secondary | ICD-10-CM | POA: Diagnosis not present

## 2015-07-08 DIAGNOSIS — R5383 Other fatigue: Secondary | ICD-10-CM | POA: Diagnosis not present

## 2015-07-08 DIAGNOSIS — G4733 Obstructive sleep apnea (adult) (pediatric): Secondary | ICD-10-CM | POA: Diagnosis not present

## 2015-07-08 DIAGNOSIS — E784 Other hyperlipidemia: Secondary | ICD-10-CM | POA: Diagnosis not present

## 2015-07-08 DIAGNOSIS — G20 Parkinson's disease: Secondary | ICD-10-CM | POA: Diagnosis not present

## 2015-07-08 DIAGNOSIS — I10 Essential (primary) hypertension: Secondary | ICD-10-CM | POA: Diagnosis not present

## 2015-07-08 DIAGNOSIS — I480 Paroxysmal atrial fibrillation: Secondary | ICD-10-CM | POA: Diagnosis not present

## 2015-07-08 DIAGNOSIS — K219 Gastro-esophageal reflux disease without esophagitis: Secondary | ICD-10-CM | POA: Diagnosis not present

## 2015-07-08 DIAGNOSIS — E119 Type 2 diabetes mellitus without complications: Secondary | ICD-10-CM | POA: Diagnosis not present

## 2015-07-08 DIAGNOSIS — R0602 Shortness of breath: Secondary | ICD-10-CM | POA: Diagnosis not present

## 2015-07-08 DIAGNOSIS — I6529 Occlusion and stenosis of unspecified carotid artery: Secondary | ICD-10-CM | POA: Diagnosis not present

## 2015-07-13 ENCOUNTER — Inpatient Hospital Stay: Payer: PPO

## 2015-07-13 ENCOUNTER — Inpatient Hospital Stay: Payer: PPO | Attending: Family Medicine

## 2015-07-13 DIAGNOSIS — Z79899 Other long term (current) drug therapy: Secondary | ICD-10-CM | POA: Insufficient documentation

## 2015-07-13 DIAGNOSIS — D751 Secondary polycythemia: Secondary | ICD-10-CM

## 2015-07-13 LAB — HEMATOCRIT: HCT: 46.6 % (ref 40.0–52.0)

## 2015-07-13 NOTE — Progress Notes (Signed)
Patient in clinic today for phlebotomy  HCT is 46.6.  Patient stated that he is going in hospital tomorrow for cardioversion.  Informed Georgeanne Nim, NP of this.  She asked that we hold off on doing phlebotomy today and that patient reschedule 2 weeks after his cardioversion tx for phlebotomy. Patient informed and agreed to make appointment in 2 weeks.

## 2015-07-14 ENCOUNTER — Encounter: Payer: Self-pay | Admitting: Anesthesiology

## 2015-07-14 ENCOUNTER — Ambulatory Visit
Admission: RE | Admit: 2015-07-14 | Discharge: 2015-07-14 | Disposition: A | Discharge status: Home / Self Care | Payer: PPO | Source: Ambulatory Visit | Attending: Internal Medicine | Admitting: Internal Medicine

## 2015-07-14 ENCOUNTER — Other Ambulatory Visit: Payer: Self-pay

## 2015-07-14 ENCOUNTER — Encounter
Admission: RE | Disposition: A | Discharge status: Home / Self Care | Payer: Self-pay | Source: Ambulatory Visit | Attending: Internal Medicine

## 2015-07-14 ENCOUNTER — Ambulatory Visit: Payer: PPO | Admitting: Anesthesiology

## 2015-07-14 DIAGNOSIS — Z86010 Personal history of colonic polyps: Secondary | ICD-10-CM | POA: Insufficient documentation

## 2015-07-14 DIAGNOSIS — I6529 Occlusion and stenosis of unspecified carotid artery: Secondary | ICD-10-CM | POA: Diagnosis not present

## 2015-07-14 DIAGNOSIS — Z9049 Acquired absence of other specified parts of digestive tract: Secondary | ICD-10-CM | POA: Diagnosis not present

## 2015-07-14 DIAGNOSIS — Z79899 Other long term (current) drug therapy: Secondary | ICD-10-CM | POA: Insufficient documentation

## 2015-07-14 DIAGNOSIS — F419 Anxiety disorder, unspecified: Secondary | ICD-10-CM | POA: Insufficient documentation

## 2015-07-14 DIAGNOSIS — Z6841 Body Mass Index (BMI) 40.0 and over, adult: Secondary | ICD-10-CM | POA: Diagnosis not present

## 2015-07-14 DIAGNOSIS — Z7951 Long term (current) use of inhaled steroids: Secondary | ICD-10-CM | POA: Diagnosis not present

## 2015-07-14 DIAGNOSIS — G20 Parkinson's disease: Secondary | ICD-10-CM | POA: Insufficient documentation

## 2015-07-14 DIAGNOSIS — Z9889 Other specified postprocedural states: Secondary | ICD-10-CM | POA: Insufficient documentation

## 2015-07-14 DIAGNOSIS — K5790 Diverticulosis of intestine, part unspecified, without perforation or abscess without bleeding: Secondary | ICD-10-CM | POA: Diagnosis not present

## 2015-07-14 DIAGNOSIS — G4733 Obstructive sleep apnea (adult) (pediatric): Secondary | ICD-10-CM | POA: Diagnosis not present

## 2015-07-14 DIAGNOSIS — E7800 Pure hypercholesterolemia, unspecified: Secondary | ICD-10-CM | POA: Diagnosis not present

## 2015-07-14 DIAGNOSIS — Z888 Allergy status to other drugs, medicaments and biological substances status: Secondary | ICD-10-CM | POA: Diagnosis not present

## 2015-07-14 DIAGNOSIS — Z79891 Long term (current) use of opiate analgesic: Secondary | ICD-10-CM | POA: Diagnosis not present

## 2015-07-14 DIAGNOSIS — Z87891 Personal history of nicotine dependence: Secondary | ICD-10-CM | POA: Insufficient documentation

## 2015-07-14 DIAGNOSIS — Z811 Family history of alcohol abuse and dependence: Secondary | ICD-10-CM | POA: Diagnosis not present

## 2015-07-14 DIAGNOSIS — R9431 Abnormal electrocardiogram [ECG] [EKG]: Secondary | ICD-10-CM | POA: Diagnosis not present

## 2015-07-14 DIAGNOSIS — E119 Type 2 diabetes mellitus without complications: Secondary | ICD-10-CM | POA: Insufficient documentation

## 2015-07-14 DIAGNOSIS — F329 Major depressive disorder, single episode, unspecified: Secondary | ICD-10-CM | POA: Diagnosis not present

## 2015-07-14 DIAGNOSIS — I10 Essential (primary) hypertension: Secondary | ICD-10-CM | POA: Insufficient documentation

## 2015-07-14 DIAGNOSIS — Z7901 Long term (current) use of anticoagulants: Secondary | ICD-10-CM | POA: Insufficient documentation

## 2015-07-14 DIAGNOSIS — Z951 Presence of aortocoronary bypass graft: Secondary | ICD-10-CM | POA: Insufficient documentation

## 2015-07-14 DIAGNOSIS — Z86711 Personal history of pulmonary embolism: Secondary | ICD-10-CM | POA: Diagnosis not present

## 2015-07-14 DIAGNOSIS — I480 Paroxysmal atrial fibrillation: Secondary | ICD-10-CM | POA: Insufficient documentation

## 2015-07-14 DIAGNOSIS — I4891 Unspecified atrial fibrillation: Secondary | ICD-10-CM | POA: Diagnosis not present

## 2015-07-14 DIAGNOSIS — I25810 Atherosclerosis of coronary artery bypass graft(s) without angina pectoris: Secondary | ICD-10-CM | POA: Diagnosis not present

## 2015-07-14 HISTORY — DX: Depression, unspecified: F32.A

## 2015-07-14 HISTORY — DX: Major depressive disorder, single episode, unspecified: F32.9

## 2015-07-14 HISTORY — PX: ELECTROPHYSIOLOGIC STUDY: SHX172A

## 2015-07-14 HISTORY — DX: Gastro-esophageal reflux disease without esophagitis: K21.9

## 2015-07-14 HISTORY — DX: Atherosclerotic heart disease of native coronary artery without angina pectoris: I25.10

## 2015-07-14 SURGERY — CARDIOVERSION (CATH LAB)
Anesthesia: General

## 2015-07-14 MED ORDER — PROPOFOL 10 MG/ML IV BOLUS
INTRAVENOUS | Status: DC | PRN
  Administered 2015-07-14: 20 mg via INTRAVENOUS
  Administered 2015-07-14: 50 mg via INTRAVENOUS

## 2015-07-14 MED ORDER — SODIUM CHLORIDE 0.9 % IV SOLN
INTRAVENOUS | Status: DC
  Administered 2015-07-14: 07:00:00 via INTRAVENOUS

## 2015-07-14 NOTE — Discharge Instructions (Signed)
Electrical Cardioversion, Care After °Refer to this sheet in the next few weeks. These instructions provide you with information on caring for yourself after your procedure. Your health care provider may also give you more specific instructions. Your treatment has been planned according to current medical practices, but problems sometimes occur. Call your health care provider if you have any problems or questions after your procedure. °WHAT TO EXPECT AFTER THE PROCEDURE °After your procedure, it is typical to have the following sensations: °· Some redness on the skin where the shocks were delivered. If this is tender, a sunburn lotion or hydrocortisone cream may help. °· Possible return of an abnormal heart rhythm within hours or days after the procedure. °HOME CARE INSTRUCTIONS °· Take medicines only as directed by your health care provider. Be sure you understand how and when to take your medicine. °· Learn how to feel your pulse and check it often. °· Limit your activity for 48 hours after the procedure or as directed by your health care provider. °· Avoid or minimize caffeine and other stimulants as directed by your health care provider. °SEEK MEDICAL CARE IF: °· You feel like your heart is beating too fast or your pulse is not regular. °· You have any questions about your medicines. °· You have bleeding that will not stop. °SEEK IMMEDIATE MEDICAL CARE IF: °· You are dizzy or feel faint. °· It is hard to breathe or you feel short of breath. °· There is a change in discomfort in your chest. °· Your speech is slurred or you have trouble moving an arm or leg on one side of your body. °· You get a serious muscle cramp that does not go away. °· Your fingers or toes turn cold or blue. °  °This information is not intended to replace advice given to you by your health care provider. Make sure you discuss any questions you have with your health care provider. °  °Document Released: 01/23/2013 Document Revised: 04/25/2014  Document Reviewed: 01/23/2013 °Elsevier Interactive Patient Education ©2016 [employer] ° °

## 2015-07-14 NOTE — Transfer of Care (Signed)
Immediate Anesthesia Transfer of Care Note  Patient: Andre [name]  Procedure(s) Performed: Procedure(s): CARDIOVERSION (N/A)  Patient Location: Specials  Anesthesia Type:General  Level of Consciousness: awake, alert  Airway & Oxygen Therapy: Patient Spontanous Breathing and Patient connected to nasal cannula oxygen  Post-op Assessment: Report given to RN and Post -op Vital signs reviewed and stable  Post vital signs: Reviewed and stable  Last Vitals:  Filed Vitals:   07/14/15 0749 07/14/15 0750  BP: 119/81 119/81  Pulse: 69 69  Temp:    Resp: 20 22    Complications: No apparent anesthesia complications

## 2015-07-14 NOTE — Anesthesia Postprocedure Evaluation (Signed)
Anesthesia Post Note  Patient: Andre [name]  Procedure(s) Performed: Procedure(s) (LRB): CARDIOVERSION (N/A)  Patient location during evaluation: Cath Lab Anesthesia Type: General Level of consciousness: awake and alert Pain management: pain level controlled Vital Signs Assessment: post-procedure vital signs reviewed and stable Respiratory status: spontaneous breathing, nonlabored ventilation, respiratory function stable and patient connected to nasal cannula oxygen Cardiovascular status: blood pressure returned to baseline and stable Postop Assessment: no signs of nausea or vomiting Anesthetic complications: no    Last Vitals:  Filed Vitals:   07/14/15 0815 07/14/15 0822  BP: 121/74 121/67  Pulse: 65 66  Temp:    Resp: 18 22    Last Pain: There were no vitals filed for this visit.               Precious Haws Piscitello

## 2015-07-14 NOTE — CV Procedure (Signed)
  Cardioversion Note A standard informed consent was obtained. Timeout was performed. The pads were placed in the anterior posterior fashion. The patient was given propofol by the anesthesia team.  Successful cardioversion was performed with a 120 J. Then 150 j sync The patient converted to sinus rhythm. Pre-and post EKGs were reviewed. The patient tolerated the procedure with no immediate complications.  Recommendations: Continue same medications and follow-up in 2-3 weeks.

## 2015-07-14 NOTE — Anesthesia Preprocedure Evaluation (Signed)
Anesthesia Evaluation  Patient identified by MRN, date of birth, ID band Patient awake    Reviewed: Allergy & Precautions, H&P , NPO status , Patient's Chart, lab work & pertinent test results  History of Anesthesia Complications Negative for: history of anesthetic complications  Airway Mallampati: III  TM Distance: >3 FB Neck ROM: limited    Dental  (+) Poor Dentition, Chipped, Missing, Upper Dentures   Pulmonary shortness of breath, sleep apnea , former smoker,    Pulmonary exam normal breath sounds clear to auscultation       Cardiovascular Exercise Tolerance: Poor hypertension, (-) angina+ CAD, + CABG and + DOE  (-) Past MI + dysrhythmias Atrial Fibrillation  Rhythm:irregular Rate:Normal     Neuro/Psych negative neurological ROS  negative psych ROS   GI/Hepatic negative GI ROS, Neg liver ROS,   Endo/Other  diabetes  Renal/GU negative Renal ROS  negative genitourinary   Musculoskeletal   Abdominal   Peds  Hematology negative hematology ROS (+)   Anesthesia Other Findings Past Medical History:   Sleep apnea                                                    Comment:wears CPAP   High cholesterol                                             Sinus trouble                                                Hypertension                                                 Parkinson's disease ([place])                                      Comment:tremors   Pulmonary embolism ([place])                        2011         Diabetes mellitus without complication ([place])                 Secondary erythrocytosis                        01/28/2015  Past Surgical History:   OTHER SURGICAL HISTORY                           1998           Comment:Bypass  BMI    Body Mass Index   40.09 kg/m 2      Reproductive/Obstetrics negative OB ROS  Anesthesia Physical Anesthesia Plan  ASA:  IV  Anesthesia Plan: General   Post-op Pain Management:    Induction:   Airway Management Planned:   Additional Equipment:   Intra-op Plan:   Post-operative Plan:   Informed Consent: I have reviewed the patients History and Physical, chart, labs and discussed the procedure including the risks, benefits and alternatives for the proposed anesthesia with the patient or authorized representative who has indicated his/her understanding and acceptance.   Dental Advisory Given  Plan Discussed with: Anesthesiologist, CRNA and Surgeon  Anesthesia Plan Comments:         Anesthesia Quick Evaluation

## 2015-07-15 DIAGNOSIS — Z85828 Personal history of other malignant neoplasm of skin: Secondary | ICD-10-CM | POA: Diagnosis not present

## 2015-07-15 DIAGNOSIS — Z1283 Encounter for screening for malignant neoplasm of skin: Secondary | ICD-10-CM | POA: Diagnosis not present

## 2015-07-15 DIAGNOSIS — L821 Other seborrheic keratosis: Secondary | ICD-10-CM | POA: Diagnosis not present

## 2015-07-15 DIAGNOSIS — G4733 Obstructive sleep apnea (adult) (pediatric): Secondary | ICD-10-CM | POA: Diagnosis not present

## 2015-07-15 DIAGNOSIS — L905 Scar conditions and fibrosis of skin: Secondary | ICD-10-CM | POA: Diagnosis not present

## 2015-07-15 DIAGNOSIS — L578 Other skin changes due to chronic exposure to nonionizing radiation: Secondary | ICD-10-CM | POA: Diagnosis not present

## 2015-07-15 DIAGNOSIS — L570 Actinic keratosis: Secondary | ICD-10-CM | POA: Diagnosis not present

## 2015-07-15 DIAGNOSIS — L812 Freckles: Secondary | ICD-10-CM | POA: Diagnosis not present

## 2015-07-16 DIAGNOSIS — J301 Allergic rhinitis due to pollen: Secondary | ICD-10-CM | POA: Diagnosis not present

## 2015-07-23 DIAGNOSIS — J301 Allergic rhinitis due to pollen: Secondary | ICD-10-CM | POA: Diagnosis not present

## 2015-07-27 ENCOUNTER — Ambulatory Visit: Payer: PPO | Admitting: Internal Medicine

## 2015-07-27 ENCOUNTER — Other Ambulatory Visit: Payer: PPO

## 2015-07-30 DIAGNOSIS — I10 Essential (primary) hypertension: Secondary | ICD-10-CM | POA: Diagnosis not present

## 2015-07-30 DIAGNOSIS — G4733 Obstructive sleep apnea (adult) (pediatric): Secondary | ICD-10-CM | POA: Diagnosis not present

## 2015-07-30 DIAGNOSIS — R0602 Shortness of breath: Secondary | ICD-10-CM | POA: Diagnosis not present

## 2015-07-30 DIAGNOSIS — R5383 Other fatigue: Secondary | ICD-10-CM | POA: Diagnosis not present

## 2015-07-30 DIAGNOSIS — G20 Parkinson's disease: Secondary | ICD-10-CM | POA: Diagnosis not present

## 2015-07-30 DIAGNOSIS — E119 Type 2 diabetes mellitus without complications: Secondary | ICD-10-CM | POA: Diagnosis not present

## 2015-07-30 DIAGNOSIS — I6529 Occlusion and stenosis of unspecified carotid artery: Secondary | ICD-10-CM | POA: Diagnosis not present

## 2015-07-30 DIAGNOSIS — I4891 Unspecified atrial fibrillation: Secondary | ICD-10-CM | POA: Diagnosis not present

## 2015-07-30 DIAGNOSIS — K219 Gastro-esophageal reflux disease without esophagitis: Secondary | ICD-10-CM | POA: Diagnosis not present

## 2015-07-30 DIAGNOSIS — E669 Obesity, unspecified: Secondary | ICD-10-CM | POA: Diagnosis not present

## 2015-07-30 DIAGNOSIS — E784 Other hyperlipidemia: Secondary | ICD-10-CM | POA: Diagnosis not present

## 2015-07-31 ENCOUNTER — Inpatient Hospital Stay: Payer: PPO

## 2015-07-31 ENCOUNTER — Inpatient Hospital Stay (HOSPITAL_BASED_OUTPATIENT_CLINIC_OR_DEPARTMENT_OTHER): Payer: PPO | Admitting: Internal Medicine

## 2015-07-31 ENCOUNTER — Encounter: Payer: Self-pay | Admitting: Internal Medicine

## 2015-07-31 ENCOUNTER — Inpatient Hospital Stay: Payer: PPO | Attending: Internal Medicine

## 2015-07-31 ENCOUNTER — Other Ambulatory Visit: Payer: Self-pay | Admitting: *Deleted

## 2015-07-31 VITALS — BP 146/76 | HR 73 | Temp 97.6°F | Resp 20 | Wt 262.8 lb

## 2015-07-31 DIAGNOSIS — Z86711 Personal history of pulmonary embolism: Secondary | ICD-10-CM | POA: Diagnosis not present

## 2015-07-31 DIAGNOSIS — Z7984 Long term (current) use of oral hypoglycemic drugs: Secondary | ICD-10-CM | POA: Diagnosis not present

## 2015-07-31 DIAGNOSIS — D696 Thrombocytopenia, unspecified: Secondary | ICD-10-CM | POA: Insufficient documentation

## 2015-07-31 DIAGNOSIS — Z951 Presence of aortocoronary bypass graft: Secondary | ICD-10-CM | POA: Diagnosis not present

## 2015-07-31 DIAGNOSIS — D751 Secondary polycythemia: Secondary | ICD-10-CM | POA: Insufficient documentation

## 2015-07-31 DIAGNOSIS — Z79899 Other long term (current) drug therapy: Secondary | ICD-10-CM | POA: Diagnosis not present

## 2015-07-31 DIAGNOSIS — F329 Major depressive disorder, single episode, unspecified: Secondary | ICD-10-CM | POA: Diagnosis not present

## 2015-07-31 DIAGNOSIS — I10 Essential (primary) hypertension: Secondary | ICD-10-CM | POA: Insufficient documentation

## 2015-07-31 DIAGNOSIS — G20 Parkinson's disease: Secondary | ICD-10-CM | POA: Insufficient documentation

## 2015-07-31 DIAGNOSIS — Z7982 Long term (current) use of aspirin: Secondary | ICD-10-CM | POA: Insufficient documentation

## 2015-07-31 DIAGNOSIS — M1990 Unspecified osteoarthritis, unspecified site: Secondary | ICD-10-CM | POA: Insufficient documentation

## 2015-07-31 DIAGNOSIS — G4730 Sleep apnea, unspecified: Secondary | ICD-10-CM | POA: Insufficient documentation

## 2015-07-31 DIAGNOSIS — K219 Gastro-esophageal reflux disease without esophagitis: Secondary | ICD-10-CM | POA: Diagnosis not present

## 2015-07-31 DIAGNOSIS — Z87891 Personal history of nicotine dependence: Secondary | ICD-10-CM | POA: Insufficient documentation

## 2015-07-31 DIAGNOSIS — E7800 Pure hypercholesterolemia, unspecified: Secondary | ICD-10-CM | POA: Insufficient documentation

## 2015-07-31 DIAGNOSIS — I2510 Atherosclerotic heart disease of native coronary artery without angina pectoris: Secondary | ICD-10-CM | POA: Diagnosis not present

## 2015-07-31 DIAGNOSIS — Z7901 Long term (current) use of anticoagulants: Secondary | ICD-10-CM | POA: Insufficient documentation

## 2015-07-31 LAB — CBC WITH DIFFERENTIAL/PLATELET
Basophils Absolute: 0.1 10*3/uL (ref 0–0.1)
Basophils Relative: 1 %
Eosinophils Absolute: 0.1 10*3/uL (ref 0–0.7)
Eosinophils Relative: 1 %
HCT: 47.5 % (ref 40.0–52.0)
Hemoglobin: 15.5 g/dL (ref 13.0–18.0)
Lymphocytes Relative: 23 %
Lymphs Abs: 2 10*3/uL (ref 1.0–3.6)
MCH: 27.3 pg (ref 26.0–34.0)
MCHC: 32.7 g/dL (ref 32.0–36.0)
MCV: 83.4 fL (ref 80.0–100.0)
Monocytes Absolute: 0.8 10*3/uL (ref 0.2–1.0)
Monocytes Relative: 9 %
Neutro Abs: 5.7 10*3/uL (ref 1.4–6.5)
Neutrophils Relative %: 66 %
Platelets: 123 10*3/uL — ABNORMAL LOW (ref 150–440)
RBC: 5.69 MIL/uL (ref 4.40–5.90)
RDW: 17.4 % — ABNORMAL HIGH (ref 11.5–14.5)
WBC: 8.6 10*3/uL (ref 3.8–10.6)

## 2015-07-31 NOTE — Progress Notes (Signed)
Patient ambulates without assistance, accompanied by wife.  Patient denies pain or discomfort, vitals documented, medication record updated, information provided by patient and wife.

## 2015-07-31 NOTE — Progress Notes (Signed)
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 ? Alcohol/fatty liver/? [April 2016]  # PE [Feb 2011 s/p chole; [facility] in [place], [place].] currently off Coumadin; April 2017- Afib- Start eliquis [Dr.Calwood]  INTERVAL HISTORY:  This is my first interaction with the patient since I joined the practice September 2016. I reviewed the patient's prior charts/pertinent labs/imaging in detail; findings are summarized above.   80 year old male patient with above history of secondary erythrocytosis from testosterone therapy/CPAP and history of A. fib on Elquis here for follow-up.  He has chronic mild fatigue which is not any worse. He feels no different after his phlebotomy. No new headaches no new thrombotic events.  Patient denies any blood in stools black stools. Appetite is fair. He admits to drinking alcohol occasionally weekly basis. He denies any chest pain or shortness of breath or cough.  Given his A. fib was started on Eliquis as per cardiology recently.  REVIEW OF SYSTEMS:  A complete 10 point review of system is done which is negative except mentioned above/history of present illness.   PAST MEDICAL HISTORY :  Past Medical History  Diagnosis Date  . Sleep apnea     wears CPAP  . High cholesterol   . Sinus trouble   . Hypertension   . Parkinson's disease ([place])     tremors  . Pulmonary embolism ([place]) 2011  . 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 Depression   . Coronary artery disease   . Dysrhythmia   . Shortness of breath dyspnea   . GERD (gastroesophageal reflux disease)   . Arthritis     PAST SURGICAL HISTORY :   Past Surgical History  Procedure Laterality Date  . Other surgical  history  1998    Bypass  . Coronary artery bypass graft    . Cholecystectomy    . Electrophysiologic study N/A 07/14/2015    Procedure: CARDIOVERSION;  Surgeon: Yolonda Kida, MD;  Location: [facility] ORS;  Service: Cardiovascular;  Laterality: N/A;    FAMILY HISTORY :  No family history on file.  SOCIAL HISTORY:   Social History  Substance Use Topics  . Smoking status: Former Smoker -- 1.00 packs/day for 10 years    Types: Cigarettes, Pipe, Cigars    Quit date: 04/18/1972  . Smokeless tobacco: Former Systems developer    Types: [place] date: 04/18/1972  . Alcohol Use: 0.0 oz/week     Comment: 2 drinks per day    ALLERGIES:  is allergic to pravastatin and prednisone.  MEDICATIONS:  Current Outpatient Prescriptions  Medication Sig Dispense Refill  . albuterol (VENTOLIN HFA) 108 (90 BASE) MCG/ACT inhaler Inhale 2 puffs into the lungs every 4 (four) hours as needed for wheezing.    [name] apixaban (ELIQUIS) 5 MG TABS tablet Take 5 mg by mouth 2 (two) times daily. Reported on 07/31/2015    . carbidopa-levodopa (PARCOPA) 25-250 MG per disintegrating tablet Take 2 tablets by mouth 3 (three) times daily. Reported on 07/14/2015    . carbidopa-levodopa (SINEMET IR) 25-250 MG tablet Take 1 tablet q3hrs, 6 times daily as directed    . cetirizine (ZYRTEC) 10 MG tablet Take 10 mg by mouth daily.    . cyclobenzaprine (FLEXERIL) 5 MG tablet 1 po tid prn    .  EPINEPHrine 0.3 mg/0.3 mL IJ SOAJ injection     . esomeprazole (NEXIUM) 40 MG capsule Take 40 mg by mouth daily before breakfast.    . esomeprazole (NEXIUM) 40 MG capsule Take 40 mg by mouth. Reported on 07/14/2015    . fluticasone (VERAMYST) 27.5 MCG/SPRAY nasal spray Place 2 sprays into the nose daily.    . metoprolol succinate (TOPROL-XL) 25 MG 24 hr tablet Take 12.5 mg by mouth.    . oxyCODONE-acetaminophen (PERCOCET/ROXICET) 5-325 MG tablet 1 po bid prn Earliest Fill Date: 11/29/14    . verapamil (CALAN-SR) 240 MG CR tablet Take 240 mg by mouth.    [name]  amiodarone (PACERONE) 200 MG tablet 200 mg 2 (two) times daily.    [name] amLODipine (NORVASC) 10 MG tablet Take 10 mg by mouth daily. Reported on 07/31/2015    . aspirin 325 MG tablet Take 325 mg by mouth daily. Reported on 07/31/2015    . aspirin 81 MG tablet Take 81 mg by mouth daily. Reported on 07/31/2015    . azelastine (ASTELIN) 137 MCG/SPRAY nasal spray Place 2 sprays into the nose daily. Reported on 07/31/2015    . b complex vitamins tablet Take 1 tablet by mouth daily. Reported on 07/31/2015    . budesonide (PULMICORT) 180 MCG/ACT inhaler Inhale 2 puffs into the lungs 2 (two) times daily. Reported on 07/31/2015    . buPROPion (WELLBUTRIN XL) 150 MG 24 hr tablet Reported on 07/31/2015    . DULoxetine (CYMBALTA) 30 MG capsule Take 30 mg by mouth daily. Reported on 07/31/2015    . hydrochlorothiazide (HYDRODIURIL) 25 MG tablet Take 25 mg by mouth daily. Reported on 07/31/2015    . lovastatin (MEVACOR) 40 MG tablet Take 40 mg by mouth daily. Reported on 07/31/2015    . metFORMIN (GLUCOPHAGE) 500 MG tablet Take 500 mg by mouth daily. Reported on 07/31/2015    . mirabegron ER (MYRBETRIQ) 50 MG TB24 tablet Take 50 mg by mouth. Reported on 07/31/2015    . pravastatin (PRAVACHOL) 20 MG tablet Take 20 mg by mouth. Reported on 07/31/2015    . tadalafil (CIALIS) 5 MG tablet Take 5 mg by mouth daily. Reported on 07/31/2015    . tadalafil (CIALIS) 5 MG tablet Take 5 mg by mouth. Reported on 07/31/2015    . tamsulosin (FLOMAX) 0.4 MG CAPS Take 0.4 mg by mouth daily. Reported on 07/31/2015    . Testosterone 10 MG/ACT (2%) GEL Apply topically. Reported on 07/31/2015     No current facility-administered medications for this visit.    PHYSICAL EXAMINATION:   BP 146/76 mmHg  Pulse 73  Temp(Src) 97.6 F (36.4 C) (Tympanic)  Wt 262 lb 12.6 oz (119.2 kg)  Filed Weights   07/31/15 1145  Weight: 262 lb 12.6 oz (119.2 kg)    GENERAL: Well-nourished well-developed; Alert, no distress and comfortable. Obese. Accompanied  by his wife. EYES: no pallor or icterus OROPHARYNX: no thrush or ulceration NECK: supple, no masses felt LYMPH:  no palpable lymphadenopathy in the cervical, axillary or inguinal regions LUNGS: clear to auscultation and  No wheeze or crackles HEART/CVS: regular rate & rhythm and no murmurs; No lower extremity edema ABDOMEN:abdomen soft, non-tender and normal bowel sounds Musculoskeletal:no cyanosis of digits and no clubbing  PSYCH: alert & oriented x 3 with fluent speech NEURO: no focal motor/sensory deficits SKIN:  no rashes or significant lesions  LABORATORY DATA:  I have reviewed the data as listed    Component Value Date/Time  CREATININE 1.10 06/09/2015 1731   CREATININE 0.95 12/06/2011 1554   PROT 7.6 09/20/2011 1529   ALBUMIN 3.7 09/20/2011 1529   AST 18 09/20/2011 1529   ALT 23 09/20/2011 1529   ALKPHOS 133 09/20/2011 1529   BILITOT 0.5 09/20/2011 1529   GFRNONAA >60 12/06/2011 1554   GFRAA >60 12/06/2011 1554    No results found for: SPEP, UPEP  Lab Results  Component Value Date   WBC 8.6 07/31/2015   NEUTROABS 5.7 07/31/2015   HGB 15.5 07/31/2015   HCT 47.5 07/31/2015   MCV 83.4 07/31/2015   PLT 123* 07/31/2015      Chemistry      Component Value Date/Time   CREATININE 1.10 06/09/2015 1731   CREATININE 0.95 12/06/2011 1554      Component Value Date/Time   ALKPHOS 133 09/20/2011 1529   AST 18 09/20/2011 1529   ALT 23 09/20/2011 1529   BILITOT 0.5 09/20/2011 1529        ASSESSMENT & PLAN:   # 2011- 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 Patient is currently off testosterone therapy. Today hemoglobin is 15.5 hematocrit 46. Patient in general did not feel any different after his phlebotomies. I would not recommend any further phlebotomies unless his hematocrit is greater than 52.  # Mild thrombocytopenia platelets 120 to 130- question secondary to alcohol versus liver disease. No cirrhosis or splenomegaly  is noted on the previous CAT scan or ultrasound. Monitor for now. It's okay from hematology standpoint to be on Eliquis- as his platelets are above 100,000.  # A. Fib- on Eliquis per cardiology/C plan above.   The above plan of care was discussed with the patient in detail. He agrees. He'll follow-up with me in 6 months.     Cammie Sickle, MD 07/31/2015 11:54 AM

## 2015-08-03 DIAGNOSIS — J301 Allergic rhinitis due to pollen: Secondary | ICD-10-CM | POA: Diagnosis not present

## 2015-08-05 ENCOUNTER — Ambulatory Visit: Payer: PPO | Admitting: Internal Medicine

## 2015-08-05 ENCOUNTER — Other Ambulatory Visit: Payer: PPO

## 2015-08-11 DIAGNOSIS — G4733 Obstructive sleep apnea (adult) (pediatric): Secondary | ICD-10-CM | POA: Diagnosis not present

## 2015-08-11 DIAGNOSIS — I10 Essential (primary) hypertension: Secondary | ICD-10-CM | POA: Diagnosis not present

## 2015-08-11 DIAGNOSIS — K219 Gastro-esophageal reflux disease without esophagitis: Secondary | ICD-10-CM | POA: Diagnosis not present

## 2015-08-11 DIAGNOSIS — R5383 Other fatigue: Secondary | ICD-10-CM | POA: Diagnosis not present

## 2015-08-11 DIAGNOSIS — E784 Other hyperlipidemia: Secondary | ICD-10-CM | POA: Diagnosis not present

## 2015-08-11 DIAGNOSIS — I6529 Occlusion and stenosis of unspecified carotid artery: Secondary | ICD-10-CM | POA: Diagnosis not present

## 2015-08-11 DIAGNOSIS — E119 Type 2 diabetes mellitus without complications: Secondary | ICD-10-CM | POA: Diagnosis not present

## 2015-08-11 DIAGNOSIS — I4891 Unspecified atrial fibrillation: Secondary | ICD-10-CM | POA: Diagnosis not present

## 2015-08-11 DIAGNOSIS — G20 Parkinson's disease: Secondary | ICD-10-CM | POA: Diagnosis not present

## 2015-08-11 DIAGNOSIS — I480 Paroxysmal atrial fibrillation: Secondary | ICD-10-CM | POA: Diagnosis not present

## 2015-08-11 DIAGNOSIS — R0602 Shortness of breath: Secondary | ICD-10-CM | POA: Diagnosis not present

## 2015-08-11 DIAGNOSIS — E669 Obesity, unspecified: Secondary | ICD-10-CM | POA: Diagnosis not present

## 2015-08-13 DIAGNOSIS — J301 Allergic rhinitis due to pollen: Secondary | ICD-10-CM | POA: Diagnosis not present

## 2015-08-14 ENCOUNTER — Other Ambulatory Visit (HOSPITAL_COMMUNITY): Payer: Self-pay | Admitting: Physician Assistant

## 2015-08-14 ENCOUNTER — Ambulatory Visit
Admission: RE | Admit: 2015-08-14 | Discharge: 2015-08-14 | Disposition: A | Discharge status: Home / Self Care | Payer: PPO | Source: Ambulatory Visit | Attending: Physician Assistant | Admitting: Physician Assistant

## 2015-08-14 DIAGNOSIS — I6782 Cerebral ischemia: Secondary | ICD-10-CM | POA: Insufficient documentation

## 2015-08-14 DIAGNOSIS — R519 Headache, unspecified: Secondary | ICD-10-CM

## 2015-08-14 DIAGNOSIS — R42 Dizziness and giddiness: Secondary | ICD-10-CM | POA: Diagnosis not present

## 2015-08-14 DIAGNOSIS — Z9989 Dependence on other enabling machines and devices: Secondary | ICD-10-CM | POA: Diagnosis not present

## 2015-08-14 DIAGNOSIS — E119 Type 2 diabetes mellitus without complications: Secondary | ICD-10-CM | POA: Diagnosis not present

## 2015-08-14 DIAGNOSIS — G4733 Obstructive sleep apnea (adult) (pediatric): Secondary | ICD-10-CM | POA: Diagnosis not present

## 2015-08-14 DIAGNOSIS — I10 Essential (primary) hypertension: Secondary | ICD-10-CM | POA: Diagnosis not present

## 2015-08-14 DIAGNOSIS — R51 Headache: Secondary | ICD-10-CM | POA: Diagnosis not present

## 2015-08-14 DIAGNOSIS — G319 Degenerative disease of nervous system, unspecified: Secondary | ICD-10-CM | POA: Insufficient documentation

## 2015-08-17 DIAGNOSIS — H6123 Impacted cerumen, bilateral: Secondary | ICD-10-CM | POA: Diagnosis not present

## 2015-08-17 DIAGNOSIS — J301 Allergic rhinitis due to pollen: Secondary | ICD-10-CM | POA: Diagnosis not present

## 2015-08-17 DIAGNOSIS — H903 Sensorineural hearing loss, bilateral: Secondary | ICD-10-CM | POA: Diagnosis not present

## 2015-08-17 DIAGNOSIS — R51 Headache: Secondary | ICD-10-CM | POA: Diagnosis not present

## 2015-08-17 DIAGNOSIS — E669 Obesity, unspecified: Secondary | ICD-10-CM | POA: Diagnosis not present

## 2015-08-17 DIAGNOSIS — G4733 Obstructive sleep apnea (adult) (pediatric): Secondary | ICD-10-CM | POA: Diagnosis not present

## 2015-08-21 DIAGNOSIS — J301 Allergic rhinitis due to pollen: Secondary | ICD-10-CM | POA: Diagnosis not present

## 2015-08-26 DIAGNOSIS — E119 Type 2 diabetes mellitus without complications: Secondary | ICD-10-CM | POA: Diagnosis not present

## 2015-08-26 DIAGNOSIS — I2510 Atherosclerotic heart disease of native coronary artery without angina pectoris: Secondary | ICD-10-CM | POA: Diagnosis not present

## 2015-08-31 DIAGNOSIS — J301 Allergic rhinitis due to pollen: Secondary | ICD-10-CM | POA: Diagnosis not present

## 2015-09-02 DIAGNOSIS — I482 Chronic atrial fibrillation: Secondary | ICD-10-CM | POA: Diagnosis not present

## 2015-09-02 DIAGNOSIS — I700 Atherosclerosis of aorta: Secondary | ICD-10-CM | POA: Diagnosis not present

## 2015-09-02 DIAGNOSIS — E784 Other hyperlipidemia: Secondary | ICD-10-CM | POA: Diagnosis not present

## 2015-09-02 DIAGNOSIS — I2510 Atherosclerotic heart disease of native coronary artery without angina pectoris: Secondary | ICD-10-CM | POA: Diagnosis not present

## 2015-09-02 DIAGNOSIS — E119 Type 2 diabetes mellitus without complications: Secondary | ICD-10-CM | POA: Diagnosis not present

## 2015-09-02 DIAGNOSIS — Z6841 Body Mass Index (BMI) 40.0 and over, adult: Secondary | ICD-10-CM | POA: Diagnosis not present

## 2015-09-02 DIAGNOSIS — D696 Thrombocytopenia, unspecified: Secondary | ICD-10-CM | POA: Diagnosis not present

## 2015-09-02 DIAGNOSIS — I10 Essential (primary) hypertension: Secondary | ICD-10-CM | POA: Diagnosis not present

## 2015-09-03 DIAGNOSIS — L570 Actinic keratosis: Secondary | ICD-10-CM | POA: Diagnosis not present

## 2015-09-03 DIAGNOSIS — L578 Other skin changes due to chronic exposure to nonionizing radiation: Secondary | ICD-10-CM | POA: Diagnosis not present

## 2015-09-16 ENCOUNTER — Encounter: Payer: Self-pay | Admitting: Internal Medicine

## 2015-09-21 ENCOUNTER — Ambulatory Visit (INDEPENDENT_AMBULATORY_CARE_PROVIDER_SITE_OTHER): Payer: PPO | Admitting: Internal Medicine

## 2015-09-21 ENCOUNTER — Encounter: Payer: Self-pay | Admitting: Internal Medicine

## 2015-09-21 VITALS — BP 130/82 | HR 79 | Ht 66.0 in | Wt 266.8 lb

## 2015-09-21 DIAGNOSIS — I482 Chronic atrial fibrillation, unspecified: Secondary | ICD-10-CM

## 2015-09-21 NOTE — Patient Instructions (Signed)
Medication Instructions: Your physician has recommended you make the following change in your medication: 1) Stop aspirin  Labwork: - none today  Procedures/Testing: - Your physician has recommended that you have a Cardioversion (DCCV). Electrical Cardioversion uses a jolt of electricity to your heart either through paddles or wired patches attached to your chest. This is a controlled, usually prescheduled, procedure. Defibrillation is done under light anesthesia in the hospital, and you usually go home the day of the procedure. This is done to get your heart back into a normal rhythm. You are not awake for the procedure. - Dr. Olin Pia nurse, Nira Conn, will call you to set this up at [facility] in 3 weeks (the week of 10/12/15)  Follow-Up: - Your physician recommends that you schedule a follow-up appointment in: 8 weeks in the [place] office with Dr. Caryl Comes.   Any Additional Special Instructions Will Be Listed Below (If Applicable).     If you need a refill on your cardiac medications before your next appointment, please call your pharmacy.

## 2015-09-21 NOTE — Progress Notes (Signed)
,sko      ELECTROPHYSIOLOGY CONSULT NOTE  Patient ID: WHITFIELD FOBBS, MRN: [id number], DOB/AGE: [birth year] 80 y.o. Admit date: (Not on file) Date of Consult: 09/21/2015  Primary Physician: Kirk Ruths., MD Primary Cardiologist: formerly D C  Consulting Physician  [facility])  Chief Complaint: atrial fibrillation   HPI Andre [name] is a 80 y.o. male  Seen for atrial fibrillation.this was identified a couple of months ago. He had no palpitations, but it had progressive fatigue and exercise intolerance.   He denies chest pain  He underwent cardioversion 3/17 and with rapid reversion was treated with amiodarone with anticipation of repeat cardioversion.  His a history of coronary artery disease with prior PCI and stenting.  He has a history of morbid obesity and is now 266 pounds having been about 140 pounds about 15 years ago. His weight has not changed appreciably in the last 3 years  He has diabetes and hypertension.  He has a history of sleep apnea but his machine has not been read in years    he is depressed. He lacks enthusiasm for life. He has Parkinson's and this gives rise to a tremor which impairs her quality of life   He has remote history of PVCs.  Notes from [facility] were reveiwed and verapamil (CCB) was started for this 2014  Echocardiogram 2/17 demonstrated normal left ventricular function mild left ventricular hypertrophy (1.3/1 cm) and left atrial enlargement  (5.0 cm)  Past Medical History  Diagnosis Date  . Sleep apnea     wears CPAP  . High cholesterol   . Sinus trouble   . Hypertension   . Parkinson's disease ([place])     tremors  . Pulmonary embolism ([place]) 2011  . 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 Depression   . Coronary artery disease   . Dysrhythmia   . Shortness of breath dyspnea   . GERD (gastroesophageal reflux disease)   . Arthritis   . A-fib [facility])       Surgical History:  Past Surgical History  Procedure Laterality Date  . Other  surgical history  1998    Bypass  . Coronary artery bypass graft    . Cholecystectomy    . Electrophysiologic study N/A 07/14/2015    Procedure: CARDIOVERSION;  Surgeon: Yolonda Kida, MD;  Location: [facility] ORS;  Service: Cardiovascular;  Laterality: N/A;     Home Meds: Prior to Admission medications   Medication Sig Start Date End Date Taking? Authorizing Provider  albuterol (VENTOLIN HFA) 108 (90 BASE) MCG/ACT inhaler Inhale 2 puffs into the lungs every 4 (four) hours as needed for wheezing.   Yes Historical Provider, MD  amiodarone (PACERONE) 200 MG tablet Take 200 mg by mouth daily.  07/30/15  Yes Historical Provider, MD  apixaban (ELIQUIS) 5 MG TABS tablet Take 5 mg by mouth 2 (two) times daily. Reported on 07/31/2015   Yes Historical Provider, MD  aspirin 81 MG tablet Take 81 mg by mouth daily. Reported on 07/31/2015   Yes Historical Provider, MD  azelastine (ASTELIN) 137 MCG/SPRAY nasal spray Place 2 sprays into the nose daily. Reported on 07/31/2015   Yes Historical Provider, MD  budesonide (PULMICORT) 180 MCG/ACT inhaler Inhale 2 puffs into the lungs 2 (two) times daily as needed (shortness of breath). Reported on 07/31/2015   Yes Historical Provider, MD  buPROPion (WELLBUTRIN XL) 150 MG 24 hr tablet 1 tablet by mouth daily 05/08/14  Yes Historical Provider, MD  carbidopa-levodopa (PARCOPA) 25-250 MG per disintegrating  tablet Take 2 tablets by mouth 3 (three) times daily. Reported on 07/14/2015   Yes Historical Provider, MD  cetirizine (ZYRTEC) 10 MG tablet Take 10 mg by mouth daily.   Yes Historical Provider, MD  cyclobenzaprine (FLEXERIL) 5 MG tablet 1 tablet by mouth 3 times a day as needed for muscle spasms 10/29/14  Yes Historical Provider, MD  EPINEPHrine 0.3 mg/0.3 mL IJ SOAJ injection Inject 0.3 mg as directed once as needed (allergic reaction).  07/01/13  Yes Historical Provider, MD  fluticasone (VERAMYST) 27.5 MCG/SPRAY nasal spray Place 2 sprays into the nose daily.   Yes Historical  Provider, MD  metoprolol succinate (TOPROL-XL) 25 MG 24 hr tablet Take 12.5 mg by mouth.   Yes Historical Provider, MD  oxyCODONE-acetaminophen (PERCOCET/ROXICET) 5-325 MG tablet 1 tablet by mouth evey eight hours as needed for pain 11/29/14  Yes Historical Provider, MD  verapamil (CALAN-SR) 240 MG CR tablet Take 240 mg by mouth daily.  10/30/13  Yes Historical Provider, MD    Allergies:  Allergies  Allergen Reactions  . Pravastatin Other (See Comments)  . Prednisone Other (See Comments)    Pt states that med makes him hyper Pt states that med makes him hyper    Social History   Social History  . Marital Status: Married    Spouse Name: N/A  . Number of Children: 2  . Years of Education: N/A   Occupational History  . Retired     Designer, television/film set   Social History Main Topics  . Smoking status: Former Smoker -- 1.00 packs/day for 10 years    Types: Cigarettes, Pipe, Cigars    Quit date: 04/18/1972  . Smokeless tobacco: Former Systems developer    Types: [place] date: 04/18/1972  . Alcohol Use: 0.0 oz/week     Comment: 2 drinks per day  . Drug Use: No  . Sexual Activity: Not on file   Other Topics Concern  . Not on file   Social History Narrative     No family history on file.   ROS:  Please see the history of present illness.     All other systems reviewed and negative.    Physical Exam   Blood pressure 130/82, pulse 79, height 5\' 6"  (1.676 m), weight 266 lb 12.8 oz (121.02 kg). General: Well developed, Morbidly obese   male in no acute distress. Head: Normocephalic, atraumatic, sclera non-icteric, no xanthomas, nares are without discharge. EENT: normal  Lymph Nodes:  none Neck: Negative for carotid bruits. JVD 8-10 Back:without scoliosis kyphosis  Lungs: Clear bilaterally to auscultation without wheezes, rales, or rhonchi. Breathing is unlabored. Heart: irr3egualr rate and rhythm without * murmur . No rubs, or gallops appreciated. Abdomen: Soft, non-tender,  non-distended with normoactive bowel sounds. No hepatomegaly. No rebound/guarding. No obvious abdominal masses. Msk:  Strength and tone appear normal for age. Extremities: No clubbing or cyanos 1+ edema.  Distal pedal pulses are 2+ and equal bilaterally. Skin: Warm and Dry Neuro: Alert and oriented X 3. CN III-XII intact Grossly normal sensory and motor function . Psych:  Responds to questions appropriately with a normal affect.      Labs: Cardiac Enzymes No results for input(s): CKTOTAL, CKMB, TROPONINI in the last 72 hours. CBC Lab Results  Component Value Date   WBC 8.6 07/31/2015   HGB 15.5 07/31/2015   HCT 47.5 07/31/2015   MCV 83.4 07/31/2015   PLT 123* 07/31/2015   PROTIME: No results for input(s): LABPROT, INR in  the last 72 hours. Chemistry No results for input(s): NA, K, CL, CO2, BUN, CREATININE, CALCIUM, PROT, BILITOT, ALKPHOS, ALT, AST, GLUCOSE in the last 168 hours.  Invalid input(s): LABALBU Lipids No results found for: CHOL, HDL, LDLCALC, TRIG BNP No results found for: PROBNP Thyroid Function Tests: No results for input(s): TSH, T4TOTAL, T3FREE, THYROIDAB in the last 72 hours.  Invalid input(s): FREET3 Miscellaneous No results found for: DDIMER  Radiology/Studies:  No results found.  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  We will plan to undertake cardioversion in about 3 weeks as his ELIQUIS has been on hold for a epidural injection. For now we will continue him on his amiodarone    He will follow-up with Dr. Ouida Sills to consider further evaluation of his sleep apnea as well as to consider therapy for depression  He will work on exercise       Virl Axe

## 2015-09-22 DIAGNOSIS — M5412 Radiculopathy, cervical region: Secondary | ICD-10-CM | POA: Diagnosis not present

## 2015-09-22 DIAGNOSIS — M5020 Other cervical disc displacement, unspecified cervical region: Secondary | ICD-10-CM | POA: Diagnosis not present

## 2015-09-28 ENCOUNTER — Telehealth: Payer: Self-pay | Admitting: Internal Medicine

## 2015-09-28 NOTE — Telephone Encounter (Signed)
New message  Pt wife called request a call back to determine if the request to a good primary care provider was received and completed. She reports that they haven't heard anything yes.  .   Pt c/o medication issue: 1. Name of Medication: ( pt wife doesn't remember the name)   4. What is your medication issue? Would like to get the anxiety medication changed today and called into the [facility] in [place]

## 2015-09-28 NOTE — Telephone Encounter (Signed)
Per Marsh Dolly, "I need to try to get this patient set up for a DCCV the week of 10/12/15. "  Scheduled 6/26, 7:30am [facility]. Messaged Heather and Dr. Rockey Situ who is aware. Order on whiteboard.

## 2015-09-28 NOTE — Telephone Encounter (Signed)
I called and spoke with the patient's wife. I advised her that Dr. Caryl Comes would not be the one to adjust his antianxiety medication, but that she would need to call Dr. Tonette Bihari office to follow up on this. I advised her that Dr. Olin Pia note should have gone over to Dr. Tonette Bihari office when he was seen. She will call Dr. Tonette Bihari office to follow up. I advised her I am still trying to get the patient's DCCV set up for [facility] the week of 10/12/15.  I will call her back when this is arranged. She would like me to call her on her cell # as they are going out of town tomorrow. Confirmed [phone].

## 2015-09-28 NOTE — Telephone Encounter (Signed)
Dr. Caryl Comes- What is the primary reason for the patient needing to change antidepressants?

## 2015-09-28 NOTE — Telephone Encounter (Signed)
Patient called pcp office to change meds and they would like to know why this was recommended by Dr. Caryl Comes.  Please call [facility] to discuss.  [phone].

## 2015-09-29 DIAGNOSIS — G20 Parkinson's disease: Secondary | ICD-10-CM | POA: Diagnosis not present

## 2015-09-29 NOTE — Telephone Encounter (Signed)
He flet like they are not working    Andre [name] should we change his antidepressants

## 2015-09-29 NOTE — Telephone Encounter (Signed)
I called and spoke with staff at Dr. Tonette Bihari office to relay reasoning to switch anti-depressant per Dr. Caryl Comes. Per Dr. Tonette Bihari staff, it looked as though this has been addressed for the patient already and that he has been switched over to a different med.

## 2015-09-30 ENCOUNTER — Telehealth: Payer: Self-pay | Admitting: Internal Medicine

## 2015-09-30 DIAGNOSIS — I480 Paroxysmal atrial fibrillation: Secondary | ICD-10-CM

## 2015-09-30 DIAGNOSIS — Z01812 Encounter for preprocedural laboratory examination: Secondary | ICD-10-CM

## 2015-09-30 NOTE — Telephone Encounter (Signed)
LMTCB

## 2015-09-30 NOTE — Telephone Encounter (Signed)
New Message:  Pt's wife  called in stating that the pt would like for his cardioversion to be done in [place] with Dr. Caryl Comes if possible. Please f/u with the pt.

## 2015-10-01 ENCOUNTER — Encounter: Payer: Self-pay | Admitting: *Deleted

## 2015-10-01 ENCOUNTER — Ambulatory Visit: Payer: PPO | Admitting: Internal Medicine

## 2015-10-01 NOTE — Telephone Encounter (Signed)
Message  Received: 3 days ago    Georgiana Shore, RN  Emily Filbert, RN           TG scheduled to do DCCV 6/26 at 7:30am. Pt arrival 6:30am.  Done!      I spoke with the patient's wife. She is aware the patient's DCCV scheduled for Monday 10/12/15 at 7:30 am. She is aware the patient should arrive at 6:30 am to the [place] to check in. NPO after midnight. The patient will come to the office on 6/20 in [place] for lab work to be done.  Letter of instructions placed at the front desk for pick up.

## 2015-10-06 ENCOUNTER — Other Ambulatory Visit (INDEPENDENT_AMBULATORY_CARE_PROVIDER_SITE_OTHER): Payer: PPO | Admitting: *Deleted

## 2015-10-06 DIAGNOSIS — I480 Paroxysmal atrial fibrillation: Secondary | ICD-10-CM | POA: Diagnosis not present

## 2015-10-06 DIAGNOSIS — Z01812 Encounter for preprocedural laboratory examination: Secondary | ICD-10-CM

## 2015-10-06 NOTE — Addendum Note (Signed)
Addended by: Othelia Pulling C on: 10/06/2015 09:00 AM   Modules accepted: Orders

## 2015-10-07 LAB — CBC WITH DIFFERENTIAL/PLATELET
Basophils Absolute: 0 10*3/uL (ref 0.0–0.2)
Basos: 0 %
EOS (ABSOLUTE): 0.1 10*3/uL (ref 0.0–0.4)
Eos: 1 %
Hematocrit: 46.4 % (ref 37.5–51.0)
Hemoglobin: 14.9 g/dL (ref 12.6–17.7)
Immature Grans (Abs): 0 10*3/uL (ref 0.0–0.1)
Immature Granulocytes: 0 %
Lymphocytes Absolute: 2.1 10*3/uL (ref 0.7–3.1)
Lymphs: 21 %
MCH: 28 pg (ref 26.6–33.0)
MCHC: 32.1 g/dL (ref 31.5–35.7)
MCV: 87 fL (ref 79–97)
Monocytes Absolute: 1.1 10*3/uL — ABNORMAL HIGH (ref 0.1–0.9)
Monocytes: 11 %
Neutrophils Absolute: 6.8 10*3/uL (ref 1.4–7.0)
Neutrophils: 67 %
Platelets: 142 10*3/uL — ABNORMAL LOW (ref 150–379)
RBC: 5.32 x10E6/uL (ref 4.14–5.80)
RDW: 16.9 % — ABNORMAL HIGH (ref 12.3–15.4)
WBC: 10.1 10*3/uL (ref 3.4–10.8)

## 2015-10-07 LAB — BASIC METABOLIC PANEL
BUN/Creatinine Ratio: 22 (ref 10–24)
BUN: 22 mg/dL (ref 8–27)
CO2: 18 mmol/L (ref 18–29)
Calcium: 9.1 mg/dL (ref 8.6–10.2)
Chloride: 103 mmol/L (ref 96–106)
Creatinine, Ser: 1.02 mg/dL (ref 0.76–1.27)
GFR calc Af Amer: 82 mL/min/{1.73_m2} (ref >59)
GFR calc non Af Amer: 71 mL/min/{1.73_m2} (ref >59)
Glucose: 171 mg/dL — ABNORMAL HIGH (ref 65–99)
Potassium: 4.9 mmol/L (ref 3.5–5.2)
Sodium: 144 mmol/L (ref 134–144)

## 2015-10-11 ENCOUNTER — Other Ambulatory Visit: Payer: Self-pay | Admitting: Internal Medicine

## 2015-10-12 ENCOUNTER — Other Ambulatory Visit: Payer: Self-pay

## 2015-10-12 ENCOUNTER — Encounter: Payer: Self-pay | Admitting: Internal Medicine

## 2015-10-12 ENCOUNTER — Telehealth: Payer: Self-pay

## 2015-10-12 ENCOUNTER — Ambulatory Visit
Admission: RE | Admit: 2015-10-12 | Discharge: 2015-10-12 | Disposition: A | Discharge status: Home / Self Care | Payer: PPO | Source: Ambulatory Visit | Attending: Cardiovascular Disease | Admitting: Cardiovascular Disease

## 2015-10-12 ENCOUNTER — Ambulatory Visit: Payer: PPO | Admitting: Certified Registered Nurse Anesthetist

## 2015-10-12 ENCOUNTER — Encounter
Admission: RE | Disposition: A | Discharge status: Home / Self Care | Payer: Self-pay | Source: Ambulatory Visit | Attending: Cardiovascular Disease

## 2015-10-12 ENCOUNTER — Encounter: Payer: Self-pay | Admitting: *Deleted

## 2015-10-12 DIAGNOSIS — Z951 Presence of aortocoronary bypass graft: Secondary | ICD-10-CM | POA: Insufficient documentation

## 2015-10-12 DIAGNOSIS — F329 Major depressive disorder, single episode, unspecified: Secondary | ICD-10-CM | POA: Insufficient documentation

## 2015-10-12 DIAGNOSIS — Z86711 Personal history of pulmonary embolism: Secondary | ICD-10-CM | POA: Diagnosis not present

## 2015-10-12 DIAGNOSIS — I2510 Atherosclerotic heart disease of native coronary artery without angina pectoris: Secondary | ICD-10-CM | POA: Insufficient documentation

## 2015-10-12 DIAGNOSIS — G20 Parkinson's disease: Secondary | ICD-10-CM | POA: Insufficient documentation

## 2015-10-12 DIAGNOSIS — I483 Typical atrial flutter: Secondary | ICD-10-CM

## 2015-10-12 DIAGNOSIS — G4730 Sleep apnea, unspecified: Secondary | ICD-10-CM | POA: Insufficient documentation

## 2015-10-12 DIAGNOSIS — R0602 Shortness of breath: Secondary | ICD-10-CM | POA: Diagnosis not present

## 2015-10-12 DIAGNOSIS — I480 Paroxysmal atrial fibrillation: Secondary | ICD-10-CM | POA: Insufficient documentation

## 2015-10-12 DIAGNOSIS — I481 Persistent atrial fibrillation: Secondary | ICD-10-CM | POA: Diagnosis not present

## 2015-10-12 DIAGNOSIS — M1990 Unspecified osteoarthritis, unspecified site: Secondary | ICD-10-CM | POA: Diagnosis not present

## 2015-10-12 DIAGNOSIS — I10 Essential (primary) hypertension: Secondary | ICD-10-CM | POA: Diagnosis not present

## 2015-10-12 DIAGNOSIS — I25810 Atherosclerosis of coronary artery bypass graft(s) without angina pectoris: Secondary | ICD-10-CM | POA: Diagnosis not present

## 2015-10-12 DIAGNOSIS — Z87891 Personal history of nicotine dependence: Secondary | ICD-10-CM | POA: Diagnosis not present

## 2015-10-12 DIAGNOSIS — K219 Gastro-esophageal reflux disease without esophagitis: Secondary | ICD-10-CM | POA: Diagnosis not present

## 2015-10-12 DIAGNOSIS — I4892 Unspecified atrial flutter: Secondary | ICD-10-CM | POA: Insufficient documentation

## 2015-10-12 DIAGNOSIS — E7800 Pure hypercholesterolemia, unspecified: Secondary | ICD-10-CM | POA: Insufficient documentation

## 2015-10-12 DIAGNOSIS — E119 Type 2 diabetes mellitus without complications: Secondary | ICD-10-CM | POA: Insufficient documentation

## 2015-10-12 DIAGNOSIS — I4891 Unspecified atrial fibrillation: Secondary | ICD-10-CM | POA: Diagnosis not present

## 2015-10-12 HISTORY — PX: ELECTROPHYSIOLOGIC STUDY: SHX172A

## 2015-10-12 SURGERY — CARDIOVERSION (CATH LAB)
Anesthesia: General

## 2015-10-12 MED ORDER — PROPOFOL 10 MG/ML IV BOLUS
INTRAVENOUS | Status: DC | PRN
  Administered 2015-10-12: 50 mg via INTRAVENOUS

## 2015-10-12 MED ORDER — LACTATED RINGERS IV SOLN
INTRAVENOUS | Status: DC | PRN
  Administered 2015-10-12: 08:00:00 via INTRAVENOUS

## 2015-10-12 NOTE — Telephone Encounter (Signed)
The earliest I can do is 10/27/15 @ 11:30am with Mungal other than that there is nothing sooner. thanks

## 2015-10-12 NOTE — Anesthesia Preprocedure Evaluation (Addendum)
Anesthesia Evaluation  Patient identified by MRN, date of birth, ID band Patient awake    Reviewed: Allergy & Precautions, H&P , NPO status , Patient's Chart, lab work & pertinent test results  History of Anesthesia Complications Negative for: history of anesthetic complications  Airway Mallampati: III  TM Distance: >3 FB Neck ROM: limited    Dental  (+) Poor Dentition, Chipped, Missing, Upper Dentures   Pulmonary shortness of breath and with exertion, sleep apnea and Continuous Positive Airway Pressure Ventilation , former smoker,    Pulmonary exam normal breath sounds clear to auscultation       Cardiovascular Exercise Tolerance: Poor hypertension, Pt. on medications (-) angina+ CAD, + CABG and + DOE  (-) Past MI + dysrhythmias Atrial Fibrillation  Rhythm:irregular Rate:Normal     Neuro/Psych Depression negative neurological ROS  negative psych ROS   GI/Hepatic negative GI ROS, Neg liver ROS, GERD  Medicated,  Endo/Other  diabetes, Well Controlled, Type 2, Oral Hypoglycemic Agents  Renal/GU negative Renal ROS  negative genitourinary   Musculoskeletal  (+) Arthritis , Osteoarthritis,    Abdominal   Peds negative pediatric ROS (+)  Hematology negative hematology ROS (+)   Anesthesia Other Findings Past Medical History:   Sleep apnea                                                    Comment:wears CPAP   High cholesterol                                             Sinus trouble                                                Hypertension                                                 Parkinson's disease ([place])                                      Comment:tremors   Pulmonary embolism ([place])                        2011         Diabetes mellitus without complication ([place])                 Secondary erythrocytosis                        01/28/2015  Past Surgical History:   OTHER SURGICAL HISTORY                            1998           Comment:Bypass  BMI    Body Mass Index   40.09 kg/m 2  Reproductive/Obstetrics negative OB ROS                            Anesthesia Physical  Anesthesia Plan  ASA: IV  Anesthesia Plan: General   Post-op Pain Management:    Induction:   Airway Management Planned: Nasal Cannula  Additional Equipment:   Intra-op Plan:   Post-operative Plan:   Informed Consent: I have reviewed the patients History and Physical, chart, labs and discussed the procedure including the risks, benefits and alternatives for the proposed anesthesia with the patient or authorized representative who has indicated his/her understanding and acceptance.   Dental Advisory Given  Plan Discussed with: Anesthesiologist, CRNA and Surgeon  Anesthesia Plan Comments:         Anesthesia Quick Evaluation

## 2015-10-12 NOTE — Transfer of Care (Signed)
Immediate Anesthesia Transfer of Care Note  Patient: Andre [name]  Procedure(s) Performed: Procedure(s): CARDIOVERSION (N/A)  Patient Location: PACU  Anesthesia Type:General  Level of Consciousness: awake and alert   Airway & Oxygen Therapy: Patient Spontanous Breathing and Patient connected to nasal cannula oxygen  Post-op Assessment: Report given to RN and Post -op Vital signs reviewed and stable  Post vital signs: Reviewed and stable  Last Vitals:  Filed Vitals:   10/12/15 0800 10/12/15 0811  BP: 205/179 132/62  Pulse: 65 65  Temp:    Resp: 23 18    Last Pain: There were no vitals filed for this visit.       Complications: No apparent anesthesia complications

## 2015-10-12 NOTE — Telephone Encounter (Signed)
Patient wants to be seen asap per wife.  Is there anywhere else to add them sooner?

## 2015-10-12 NOTE — CV Procedure (Signed)
Cardioversion procedure note For atrial flutter, typical  Procedure Details:  Consent: Risks of procedure as well as the alternatives and risks of each were explained to the (patient/caregiver). Consent for procedure obtained.  Time Out: Verified patient identification, verified procedure, site/side was marked, verified correct patient position, special equipment/implants available, medications/allergies/relevent history reviewed, required imaging and test results available. Performed  Patient placed on cardiac monitor, pulse oximetry, supplemental oxygen as necessary.  Sedation given: 50 mg propofol IV, Dr. Kayleen Memos Pacer pads placed anterior and posterior chest.   Cardioverted 2 time(s).  Cardioverted at  150J, repeat 200 J. Synchronized biphasic Converted to NSR   Evaluation: Findings: Post procedure EKG shows: NSR Complications: None Patient did tolerate procedure well.  Time Spent Directly with the Patient:  49 minutes   Esmond Plants, M.D., Ph.D.

## 2015-10-12 NOTE — Anesthesia Procedure Notes (Signed)
Date/Time: 10/12/2015 7:40 AM Performed by: Johnna Acosta Pre-anesthesia Checklist: Patient identified, Emergency Drugs available, Suction available, Patient being monitored and Timeout performed Patient Re-evaluated:Patient Re-evaluated prior to inductionOxygen Delivery Method: Nasal cannula Preoxygenation: Pre-oxygenation with 100% oxygen

## 2015-10-12 NOTE — Telephone Encounter (Signed)
Ok i will call and see if they are interested.  Thank you .

## 2015-10-13 NOTE — Anesthesia Postprocedure Evaluation (Signed)
Anesthesia Post Note  Patient: Andre [name]  Procedure(s) Performed: Procedure(s) (LRB): CARDIOVERSION (N/A)  Patient location during evaluation: PACU Anesthesia Type: General Level of consciousness: awake and alert and oriented Pain management: pain level controlled Vital Signs Assessment: post-procedure vital signs reviewed and stable Respiratory status: spontaneous breathing Cardiovascular status: blood pressure returned to baseline Anesthetic complications: no    Last Vitals:  Filed Vitals:   10/12/15 0811 10/12/15 0815  BP: 132/62 128/62  Pulse: 65 66  Temp:    Resp: 18 22    Last Pain: There were no vitals filed for this visit.               Macaiah Mangal

## 2015-10-14 NOTE — Telephone Encounter (Signed)
error 

## 2015-10-16 DIAGNOSIS — G4733 Obstructive sleep apnea (adult) (pediatric): Secondary | ICD-10-CM | POA: Diagnosis not present

## 2015-10-27 ENCOUNTER — Ambulatory Visit (INDEPENDENT_AMBULATORY_CARE_PROVIDER_SITE_OTHER): Payer: PPO | Admitting: Internal Medicine

## 2015-10-27 ENCOUNTER — Encounter: Payer: Self-pay | Admitting: Internal Medicine

## 2015-10-27 VITALS — BP 130/88 | HR 68 | Ht 67.0 in | Wt 268.0 lb

## 2015-10-27 DIAGNOSIS — R5383 Other fatigue: Secondary | ICD-10-CM

## 2015-10-27 DIAGNOSIS — G4733 Obstructive sleep apnea (adult) (pediatric): Secondary | ICD-10-CM

## 2015-10-27 DIAGNOSIS — R0600 Dyspnea, unspecified: Secondary | ICD-10-CM

## 2015-10-27 NOTE — Assessment & Plan Note (Signed)
Multifactorial: Obstructive sleep apnea, deconditioning, atrial fibrillation, medication usage  Plan: -Exercise as tolerated -Sleep titration study -Dietary modification restrictions

## 2015-10-27 NOTE — Assessment & Plan Note (Signed)
Multifactorial: Dr. sleep apnea, cardiac disease, deconditioning, overweight,medication  Review of his medications showed that he is on a beta blocker, Flexeril, Parcopa, Percocet- these medication in conjunction with his medical issues, cardiac disease/OSA/ could further be adding to his fatigue.  Plan: -Patient educated on dietary restrictions and exercise as tolerated -Weight loss -Sleep titration study

## 2015-10-27 NOTE — Patient Instructions (Signed)
Follow up with Dr. Stevenson Clinch in: 4weeks - we will setup a sleep titration study - cont with your current cpap machine - exercise as tolerated.

## 2015-10-27 NOTE — Progress Notes (Signed)
Subjective:    Patient ID: Andre [name], male    DOB: 1936-03-17, 80 y.o.   MRN: [id number]  HPI 80 yo former smoker (10-15 pk-yrs), hx CAD/CABG, HTN, allergies on immunotherapy, OSA on CPAP, prior PE. He is referred by Dr Tami Ribas w ENT for dyspnea. He reports a tickle in throat and cough about 6 mo ago. He ascribed it to his allergies, started on veramyst, astelin, zyrtec. He has also had progressive SOB, especially with exertion, talking, when he is nervous. On at least one occasion he was treated for an AE with prednisone, BD's. He felt that this helped him, but then right back to same place after the pred was finished. He was also started on pulmicort + albuterol. Was seen by Dr Vella Kohler and apparently had PFT. He has also been treated for edema with lasix. He also had a cardiac eval at [facility] that he reports was reassuring. He has been gaining wt, at least 35 lbs over the last year.   ROV 09/28/12 -- f/u for SOB, cough. He has undergone nuclear stress testing and full PFT >> show mixed restriction and obstruction, volumes confirm restriction, DLCO corrects for [place]. He also had a repeat BiPAP titration in 2014.   Clinic Visit 10/27/2015 Patient presents today for follow-up visit of his sleep apnea. History is somewhat difficult from the patient due to him not being able to recall much of what is going on with his CPAP machine. Review of chart shows that he's had a BiPAP titration study and has been on BiPAP, but patient is been telling me he is on CPAP, possibly AutoPap. He had a sleep titration study done in 2014, followed by Dr. Vella Kohler, however final results and I could not be located within the electronic medical records. [facility] Per the patient he was rereferred to pulmonary for OSA optimization and a possible sleep titration study evaluation, given that he is continuing to have recurrent episodes of atrial flutter even after 2 episodes of  electric cardioversion. Today he does endorse fatigue, mild shortness of breath, and malaise. He states that he will get sleepy just reading a short article or just being in the car for short period of time. He does not know his exact pressures that he is on for his Pap machine. Review of records showed has had cardioversion with temporary relief in his symptoms of shortness of breath, fatigue, malaise.    PULMONARY FUNCTON TEST 09/28/2012  FVC 2.37  FEV1 1.82  FEV1/FVC 76.8  FVC  % Predicted 63  FEV % Predicted 67  FeF 25-75 2.37  FeF 25-75 % Predicted 3.5    Review of Systems  Constitutional: Positive for fatigue. Negative for fever, chills and unexpected weight change.  HENT: Negative for congestion, dental problem, ear pain, nosebleeds, postnasal drip, rhinorrhea, sinus pressure, sneezing, sore throat and trouble swallowing.   Eyes: Negative for redness and itching.  Respiratory: Positive for shortness of breath. Negative for cough, chest tightness and wheezing.   Cardiovascular: Negative for palpitations and leg swelling.  Gastrointestinal: Negative for nausea and vomiting.  Genitourinary: Negative for dysuria.  Musculoskeletal: Negative for joint swelling.  Skin: Negative for rash.  Neurological: Negative for dizziness, light-headedness and headaches.  Hematological: Does not bruise/bleed easily.  Psychiatric/Behavioral: Negative for dysphoric mood. The patient is not nervous/anxious.    Past Medical History  Diagnosis Date  . Sleep apnea     wears CPAP  . High cholesterol   .  Sinus trouble   . Hypertension   . Parkinson's disease ([place])     tremors  . Pulmonary embolism ([place]) 2011  . 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 Depression   . Coronary artery disease   . Dysrhythmia   . Shortness of breath dyspnea   . GERD (gastroesophageal reflux disease)   . Arthritis   . A-fib ([place])      No family history on file.   Social History   Social History  . Marital  Status: Married    Spouse Name: N/A  . Number of Children: 2  . Years of Education: N/A   Occupational History  . Retired     Designer, television/film set   Social History Main Topics  . Smoking status: Former Smoker -- 1.00 packs/day for 10 years    Types: Cigarettes, Pipe, Cigars    Quit date: 04/18/1972  . Smokeless tobacco: Former Systems developer    Types: [place] date: 04/18/1972  . Alcohol Use: 0.0 oz/week     Comment: 2 drinks per day  . Drug Use: No  . Sexual Activity: Not on file   Other Topics Concern  . Not on file   Social History Narrative     Allergies  Allergen Reactions  . Pravastatin Other (See Comments)  . Prednisone Other (See Comments)    Pt states that med makes him hyper Pt states that med makes him hyper     Outpatient Prescriptions Prior to Visit  Medication Sig Dispense Refill  . albuterol (VENTOLIN HFA) 108 (90 BASE) MCG/ACT inhaler Inhale 2 puffs into the lungs every 4 (four) hours as needed for wheezing.    [name] amiodarone (PACERONE) 200 MG tablet Take 200 mg by mouth daily.     [name] apixaban (ELIQUIS) 5 MG TABS tablet Take 5 mg by mouth 2 (two) times daily. Reported on 07/31/2015    . azelastine (ASTELIN) 137 MCG/SPRAY nasal spray Place 2 sprays into the nose daily. Reported on 07/31/2015    . budesonide (PULMICORT) 180 MCG/ACT inhaler Inhale 2 puffs into the lungs 2 (two) times daily as needed (shortness of breath). Reported on 07/31/2015    . busPIRone (BUSPAR) 7.5 MG tablet Take 7.5 mg by mouth 2 (two) times daily.    . carbidopa-levodopa (PARCOPA) 25-250 MG per disintegrating tablet Take 1 tablet by mouth 6 (six) times daily. Reported on 07/14/2015    . cetirizine (ZYRTEC) 10 MG tablet Take 10 mg by mouth daily.    . cyclobenzaprine (FLEXERIL) 5 MG tablet 1 tablet by mouth 3 times a day as needed for muscle spasms    . EPINEPHrine 0.3 mg/0.3 mL IJ SOAJ injection Inject 0.3 mg as directed once as needed (allergic reaction). Reported on 10/27/2015    .  esomeprazole (NEXIUM) 20 MG capsule Take 40 mg by mouth daily at 12 noon.    . fluticasone (VERAMYST) 27.5 MCG/SPRAY nasal spray Place 2 sprays into the nose daily.    . metoprolol succinate (TOPROL-XL) 25 MG 24 hr tablet Take 12.5 mg by mouth.    . mirabegron ER (MYRBETRIQ) 50 MG TB24 tablet Take 50 mg by mouth daily.    [name] oxyCODONE-acetaminophen (PERCOCET/ROXICET) 5-325 MG tablet 1 tablet by mouth evey eight hours as needed for pain    . verapamil (CALAN-SR) 240 MG CR tablet Take 240 mg by mouth daily.     [name] buPROPion (WELLBUTRIN XL) 150 MG 24 hr tablet Reported on 10/27/2015     No facility-administered medications  prior to visit.       Objective:   Physical Exam  Constitutional: He is oriented to person, place, and time. He appears well-developed and well-nourished.  HENT:  Head: Normocephalic and atraumatic.  Eyes: Pupils are equal, round, and reactive to light.  Neck: Normal range of motion. Neck supple.  Cardiovascular: Normal rate.   Pulmonary/Chest: Effort normal. No respiratory distress. He has no wheezes. He has no rales. He exhibits no tenderness.  Abdominal: Soft. Bowel sounds are normal.  Musculoskeletal: Normal range of motion. He exhibits no edema.  Neurological: He is alert and oriented to person, place, and time.  Skin: Skin is dry.  Psychiatric: He has a normal mood and affect.   Filed Vitals:   10/27/15 1111  BP: 130/88  Pulse: 68  Height: 5\' 7"  (1.702 m)  Weight: 268 lb (121.564 kg)  SpO2: 94%       Assessment & Plan:  OSA (obstructive sleep apnea) Patient with prior sleep study with diagnosis of severe sleep apnea. At today's visit, there is difficulty in obtaining his exact sleep history. However I do believe that he is on AutoPap of some sort. Given his increased fatigue and recurrence of atrial fibrillation even after 2 electrical cardioversion, I believe that a sleep titration study is warranted at this time. Patient is advised to continue his  current CPAP/AutoPap, until further recommendations after his sleep titration study.   Plan: -Sleep titration study -Further recommendations on CPAP or noninvasive positive pressure use pending results of titration study.  Dyspnea Multifactorial: Obstructive sleep apnea, deconditioning, atrial fibrillation, medication usage  Plan: -Exercise as tolerated -Sleep titration study -Dietary modification restrictions  Fatigue Multifactorial: Dr. sleep apnea, cardiac disease, deconditioning, overweight,medication  Review of his medications showed that he is on a beta blocker, Flexeril, Parcopa, Percocet- these medication in conjunction with his medical issues, cardiac disease/OSA/ could further be adding to his fatigue.  Plan: -Patient educated on dietary restrictions and exercise as tolerated -Weight loss -Sleep titration study

## 2015-10-27 NOTE — Assessment & Plan Note (Signed)
Patient with prior sleep study with diagnosis of severe sleep apnea. At today's visit, there is difficulty in obtaining his exact sleep history. However I do believe that he is on AutoPap of some sort. Given his increased fatigue and recurrence of atrial fibrillation even after 2 electrical cardioversion, I believe that a sleep titration study is warranted at this time. Patient is advised to continue his current CPAP/AutoPap, until further recommendations after his sleep titration study.   Plan: -Sleep titration study -Further recommendations on CPAP or noninvasive positive pressure use pending results of titration study.

## 2015-11-04 ENCOUNTER — Institutional Professional Consult (permissible substitution): Payer: PPO | Admitting: Internal Medicine

## 2015-11-04 ENCOUNTER — Ambulatory Visit: Payer: PPO | Attending: Pulmonary Disease

## 2015-11-04 DIAGNOSIS — G4733 Obstructive sleep apnea (adult) (pediatric): Secondary | ICD-10-CM | POA: Insufficient documentation

## 2015-11-10 DIAGNOSIS — G4733 Obstructive sleep apnea (adult) (pediatric): Secondary | ICD-10-CM | POA: Diagnosis not present

## 2015-11-12 DIAGNOSIS — L821 Other seborrheic keratosis: Secondary | ICD-10-CM | POA: Diagnosis not present

## 2015-11-12 DIAGNOSIS — L820 Inflamed seborrheic keratosis: Secondary | ICD-10-CM | POA: Diagnosis not present

## 2015-11-12 DIAGNOSIS — L570 Actinic keratosis: Secondary | ICD-10-CM | POA: Diagnosis not present

## 2015-11-12 DIAGNOSIS — L578 Other skin changes due to chronic exposure to nonionizing radiation: Secondary | ICD-10-CM | POA: Diagnosis not present

## 2015-11-16 DIAGNOSIS — M5412 Radiculopathy, cervical region: Secondary | ICD-10-CM | POA: Diagnosis not present

## 2015-11-16 DIAGNOSIS — M5020 Other cervical disc displacement, unspecified cervical region: Secondary | ICD-10-CM | POA: Diagnosis not present

## 2015-11-19 ENCOUNTER — Ambulatory Visit (INDEPENDENT_AMBULATORY_CARE_PROVIDER_SITE_OTHER): Payer: PPO | Admitting: Internal Medicine

## 2015-11-19 ENCOUNTER — Encounter: Payer: Self-pay | Admitting: Internal Medicine

## 2015-11-19 VITALS — BP 142/74 | HR 69 | Wt 270.0 lb

## 2015-11-19 VITALS — BP 140/80 | HR 63 | Ht 66.0 in | Wt 270.5 lb

## 2015-11-19 DIAGNOSIS — G4733 Obstructive sleep apnea (adult) (pediatric): Secondary | ICD-10-CM | POA: Diagnosis not present

## 2015-11-19 DIAGNOSIS — I482 Chronic atrial fibrillation, unspecified: Secondary | ICD-10-CM

## 2015-11-19 DIAGNOSIS — R0600 Dyspnea, unspecified: Secondary | ICD-10-CM

## 2015-11-19 DIAGNOSIS — I5030 Unspecified diastolic (congestive) heart failure: Secondary | ICD-10-CM

## 2015-11-19 DIAGNOSIS — R5383 Other fatigue: Secondary | ICD-10-CM

## 2015-11-19 MED ORDER — FUROSEMIDE 40 MG PO TABS: ORAL_TABLET | ORAL | 4 refills | Status: DC

## 2015-11-19 NOTE — Assessment & Plan Note (Signed)
Patient with prior sleep study with diagnosis of severe sleep apnea. He had a sleep titration study done on July 2017, that showed that he requires a full facemask CPAP setting of 12 cm of water. Patient states his current machine is more in 78 years old and he is requesting a new machine. Orders for new Machine and supplies sent to his DME.   Plan: -CPAP 12cm H2O with full face mask

## 2015-11-19 NOTE — Patient Instructions (Signed)
Medication Instructions: - Your physician has recommended you make the following change in your medication:  1) Start lasix (furosemide) 40 mg one tablet by mouth once every other day  Labwork: - Your physician recommends that you return for lab work in: 10 days (CMET/ TSH)  Procedures/Testing: - none  Follow-Up: - Your physician wants you to follow-up in: 6 months with Dr. Caryl Comes. You will receive a reminder letter in the mail two months in advance. If you don't receive a letter, please call our office to schedule the follow-up appointment.  Any Additional Special Instructions Will Be Listed Below (If Applicable). 1) Pulse oximeter- to monitor heart rate 2) [place]     If you need a refill on your cardiac medications before your next appointment, please call your pharmacy.

## 2015-11-19 NOTE — Patient Instructions (Signed)
Follow up with Dr. Stevenson Clinch in:1 month - CPAP 12cm H2O with full face mask - CPAP compliance follow up in one month.  - cont with diet, exercise and weight loss.

## 2015-11-19 NOTE — Assessment & Plan Note (Signed)
Multifactorial: Obstructive sleep apnea, deconditioning, atrial fibrillation, medication usage  Plan: -Exercise as tolerated -CPAP 12cm H2O with full face mask -Dietary modification restrictions

## 2015-11-19 NOTE — Assessment & Plan Note (Signed)
Multifactorial: obstructive sleep apnea, cardiac disease, deconditioning, overweight,medication  Review of his medications showed that he is on a beta blocker, Flexeril, Percocet- these medication in conjunction with his medical issues, cardiac disease/OSA/ could further be adding to his fatigue.  Plan: -Patient educated on dietary restrictions and exercise as tolerated -Weight loss -CPAP 12cm H2O with full face mask - limit use of benzos, narcotics, and sedative type medications.

## 2015-11-19 NOTE — Progress Notes (Signed)
Patient Care Team: Kirk Ruths, MD as PCP - General (Internal Medicine)   HPI  Andre [name] is a 80 y.o. male Seen in follow-up for exercise intolerance in the context of atrial fibrillation. It had been unassociated with palpitations. He does undergone cardioversion 3/17 with rapid reversion and was subsequently treated with amiodarone. He has continued on this. Cardioversion was anticipated after he was seen in early June. This was done 6/17  Records and Results Reviewed  VEchocardiogram 2/17 demonstrated normal left ventricular function mild left ventricular hypertrophy (1.3/1 cm) and left atrial enlargement  (5.0 cm)  No TSH is available dating back many years  He is somewhat better following cardioversion tremendously. He thinks he is somewhat better since his CPAP adjustments have been started. Further adjustments are pending.  His diet is replete with sodium    Past Medical History:  Diagnosis Date  . A-fib ([place])   . Arthritis   . Coronary artery disease   . Depression   . Dysrhythmia   . GERD (gastroesophageal reflux disease)   . High cholesterol   . Hypertension   . Parkinson's disease ([place])    tremors  . Pulmonary embolism ([place]) 2011  . 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 Shortness of breath dyspnea   . Sinus trouble   . Sleep apnea    wears CPAP    Past Surgical History:  Procedure Laterality Date  . BACK SURGERY  1960  . CHOLECYSTECTOMY    . CORONARY ARTERY BYPASS GRAFT    . ELECTROPHYSIOLOGIC STUDY N/A 07/14/2015   Procedure: CARDIOVERSION;  Surgeon: Yolonda Kida, MD;  Location: [facility] ORS;  Service: Cardiovascular;  Laterality: N/A;  . ELECTROPHYSIOLOGIC STUDY N/A 10/12/2015   Procedure: CARDIOVERSION;  Surgeon: Minna Merritts, MD;  Location: [facility] ORS;  Service: Cardiovascular;  Laterality: N/A;  . OTHER SURGICAL HISTORY  1998   Bypass    Current Outpatient Prescriptions  Medication Sig Dispense Refill  . albuterol (VENTOLIN  HFA) 108 (90 BASE) MCG/ACT inhaler Inhale 2 puffs into the lungs every 4 (four) hours as needed for wheezing.    [name] amiodarone (PACERONE) 200 MG tablet Take 200 mg by mouth daily.     [name] apixaban (ELIQUIS) 5 MG TABS tablet Take 5 mg by mouth 2 (two) times daily. Reported on 07/31/2015    . azelastine (ASTELIN) 137 MCG/SPRAY nasal spray Place 2 sprays into the nose daily. Reported on 07/31/2015    . budesonide (PULMICORT) 180 MCG/ACT inhaler Inhale 2 puffs into the lungs 2 (two) times daily as needed (shortness of breath). Reported on 07/31/2015    . busPIRone (BUSPAR) 7.5 MG tablet Take 7.5 mg by mouth 2 (two) times daily.    . carbidopa-levodopa (PARCOPA) 25-250 MG per disintegrating tablet Take 1 tablet by mouth 6 (six) times daily. Reported on 07/14/2015    . cetirizine (ZYRTEC) 10 MG tablet Take 10 mg by mouth daily.    . cyclobenzaprine (FLEXERIL) 5 MG tablet 1 tablet by mouth 3 times a day as needed for muscle spasms    . EPINEPHrine 0.3 mg/0.3 mL IJ SOAJ injection Inject 0.3 mg as directed once as needed (allergic reaction). Reported on 10/27/2015    . esomeprazole (NEXIUM) 20 MG capsule Take 40 mg by mouth daily at 12 noon.    . metoprolol succinate (TOPROL-XL) 25 MG 24 hr tablet Take 12.5 mg by mouth.    . mirabegron ER (MYRBETRIQ) 50 MG TB24 tablet Take 50 mg by  mouth daily.    [name] oxyCODONE-acetaminophen (PERCOCET/ROXICET) 5-325 MG tablet 1 tablet by mouth evey eight hours as needed for pain    . verapamil (CALAN-SR) 240 MG CR tablet Take 240 mg by mouth daily.      No current facility-administered medications for this visit.     Allergies  Allergen Reactions  . Pravastatin Other (See Comments)  . Prednisone Other (See Comments)    Pt states that med makes him hyper Pt states that med makes him hyper      Review of Systems negative except from HPI and PMH  Physical Exam BP 140/80 (BP Location: Left Arm, Patient Position: Sitting, Cuff Size: Normal)   Pulse 63   Ht 5\' 6"  (1.676 m)    Wt 270 lb 8 oz (122.7 kg)   BMI 43.66 kg/m  Well developed and well nourished in no acute distress  Morbidly obese HENT normal E scleral and icterus clear Neck Supple JVP 8-10  carotids brisk and full Clear to ausculation  Regular rate and rhythm, no murmurs gallops or rub Soft with active bowel sounds No clubbing cyanosis  2+  Edema Alert and oriented, grossly normal motor and sensory function Skin Warm and Dry     Assessment and Plan:  Morbid obesity and sleep apnea  HFpEF  Atrial fibrillation-persistent  Depression  He is holding sinus rhythm .we will check amiodarone surveillance laboratories but we will defer them for about a week as we begin his diuretics  --see below .  Creatinine and potassium were normal 6/17  His CPAP resulted need to be adjusted; he is already feeling some better  He is holding sinus rhythm. He has bradycardia. I wonder whether his chronotropically incompetent. We will discontinue his metoprolol. Continue his verapamil for right now as needed for blood pressure although we might be able to switch her to amlodipine. We will use a pulse oximeter to try to identify heart rate excursion when he walks up the back of the heel as well as to help him track regularity as a surrogate of atrial fibrillation. In the event that he has reversion to atrial fibrillation I would recommend that we undertake cardioversion.    He has significant edema. We will add furosemide 40 mg every other day.   We have also discussed the importance of diet and weight loss. I have given him information as to the [employer]; we also discussed the importance of salt restriction    \

## 2015-11-19 NOTE — Progress Notes (Signed)
Andre [name] Consultation      MRN# [id number] Andre [name] 09-12-1935   CC: Chief Complaint  Patient presents with  . Follow-up    f/u sleep study; sleeping a little better; SOB no better but not any worse.      Brief History: 80 yo M former smoker, history of CAD/CABG/hypertension/allergies on immunotherapy/OSA on CPAP/prior PE, initially seen in consultation for dyspnea, PFT with mixed restriction and obstruction, currently following with pulmonary for OSA optimization.   Events since last clinic visit: Presents today for follow-up of his sleep study. He was diagnosed with OSA in 2010, AHI= 52. He had a sleep titration study done within the last 2 weeks, that showed patient required a CPAP of 12 cm of water with sleep. Patient states he has some fatigue, shortness of breath and dyspnea on exertion, especially with incline surfaces. Stated that his current CPAP machine is more in 78 years old and he will need a new one.     Review of Systems  Constitutional: Positive for malaise/fatigue.  Eyes: Negative for blurred vision.  Respiratory: Positive for shortness of breath. Negative for cough, hemoptysis, sputum production and wheezing.   Cardiovascular: Negative for chest pain.  Genitourinary: Negative for dysuria.  Musculoskeletal: Negative for myalgias.  Neurological: Positive for weakness. Negative for dizziness and headaches.  Endo/Heme/Allergies: Does not bruise/bleed easily.  Psychiatric/Behavioral: Negative for depression.      Allergies:  Pravastatin and Prednisone  Physical Examination:  VS: BP (!) 142/74 (BP Location: Left Arm, Cuff Size: Normal)   Pulse 69   Wt 270 lb (122.5 kg)   SpO2 94%   BMI 42.29 kg/m   General Appearance: No distress  HEENT: PERRLA, no ptosis, no other lesions noticed Pulmonary:normal breath sounds., diaphragmatic excursion normal.No wheezing, No rales   Cardiovascular:  Normal S1,S2.  No m/r/g.       Abdomen:Exam: Benign, Soft, non-tender, No masses  Skin:   warm, no rashes, no ecchymosis  Extremities: normal, no cyanosis, clubbing, warm with normal capillary refill.      Assessment and Plan: Dyspnea Multifactorial: Obstructive sleep apnea, deconditioning, atrial fibrillation, medication usage  Plan: -Exercise as tolerated -CPAP 12cm H2O with full face mask -Dietary modification restrictions  Fatigue Multifactorial: obstructive sleep apnea, cardiac disease, deconditioning, overweight,medication  Review of his medications showed that he is on a beta blocker, Flexeril, Percocet- these medication in conjunction with his medical issues, cardiac disease/OSA/ could further be adding to his fatigue.  Plan: -Patient educated on dietary restrictions and exercise as tolerated -Weight loss -CPAP 12cm H2O with full face mask - limit use of benzos, narcotics, and sedative type medications.   OSA (obstructive sleep apnea) Patient with prior sleep study with diagnosis of severe sleep apnea. He had a sleep titration study done on July 2017, that showed that he requires a full facemask CPAP setting of 12 cm of water. Patient states his current machine is more in 78 years old and he is requesting a new machine. Orders for new Machine and supplies sent to his DME.   Plan: -CPAP 12cm H2O with full face mask   Updated Medication List Outpatient Encounter Prescriptions as of 11/19/2015  Medication Sig  . albuterol (VENTOLIN HFA) 108 (90 BASE) MCG/ACT inhaler Inhale 2 puffs into the lungs every 4 (four) hours as needed for wheezing.  [name] amiodarone (PACERONE) 200 MG tablet Take 200 mg by mouth daily.   [name] apixaban (ELIQUIS) 5 MG TABS tablet Take 5 mg by mouth  2 (two) times daily. Reported on 07/31/2015  . azelastine (ASTELIN) 137 MCG/SPRAY nasal spray Place 2 sprays into the nose daily. Reported on 07/31/2015  . budesonide (PULMICORT) 180 MCG/ACT inhaler Inhale 2 puffs into the lungs 2 (two) times  daily as needed (shortness of breath). Reported on 07/31/2015  . busPIRone (BUSPAR) 7.5 MG tablet Take 7.5 mg by mouth 2 (two) times daily.  . carbidopa-levodopa (PARCOPA) 25-250 MG per disintegrating tablet Take 1 tablet by mouth 6 (six) times daily. Reported on 07/14/2015  . cetirizine (ZYRTEC) 10 MG tablet Take 10 mg by mouth daily.  . cyclobenzaprine (FLEXERIL) 5 MG tablet 1 tablet by mouth 3 times a day as needed for muscle spasms  . EPINEPHrine 0.3 mg/0.3 mL IJ SOAJ injection Inject 0.3 mg as directed once as needed (allergic reaction). Reported on 10/27/2015  . esomeprazole (NEXIUM) 20 MG capsule Take 40 mg by mouth daily at 12 noon.  . metoprolol succinate (TOPROL-XL) 25 MG 24 hr tablet Take 12.5 mg by mouth.  . mirabegron ER (MYRBETRIQ) 50 MG TB24 tablet Take 50 mg by mouth daily.  [name] oxyCODONE-acetaminophen (PERCOCET/ROXICET) 5-325 MG tablet 1 tablet by mouth evey eight hours as needed for pain  . verapamil (CALAN-SR) 240 MG CR tablet Take 240 mg by mouth daily.   . [DISCONTINUED] fluticasone (VERAMYST) 27.5 MCG/SPRAY nasal spray Place 2 sprays into the nose daily.   No facility-administered encounter medications on file as of 11/19/2015.     Orders for this visit: Orders Placed This Encounter  Procedures  . Ambulatory Referral for DME    Referral Priority:   Routine    Referral Type:   Durable Medical Equipment Purchase    Number of Visits Requested:   1    Thank  you for the visitation and for allowing  [facility] to assist in the care of your patient. Our recommendations are noted above.  Please contact us if we can be of further service.  Vilinda Boehringer, MD [facility] Office Number: [phone]  Note: This note was prepared with Dragon dictation along with smaller phrase technology. Any transcriptional errors that result from this process are unintentional.

## 2015-11-20 ENCOUNTER — Telehealth: Payer: Self-pay | Admitting: Internal Medicine

## 2015-11-20 NOTE — Telephone Encounter (Signed)
New Message  Pt c/o medication issue:  1. Name of Medication: Myrbetriq & Lasic  2. How are you currently taking this medication (dosage and times per day)? Lasic-Every other day 40 mg, Myrbetriq-Everyday 50 mg  3. Are you having a reaction (difficulty breathing--STAT)? No  4. What is your medication issue? Pts wife is calling with concerns if pt needs to continue to take Myrbetriq

## 2015-11-20 NOTE — Telephone Encounter (Signed)
PT'S WIFE  AWARE  NEED  TO  CONTACT  UROLOGISTS  TO DETERMINE  IF   PT  NEEDS  TO  CONTINUE  MIRABEGRON./CY

## 2015-12-02 ENCOUNTER — Other Ambulatory Visit: Payer: Self-pay | Admitting: Cardiovascular Disease

## 2015-12-02 ENCOUNTER — Telehealth: Payer: Self-pay | Admitting: Internal Medicine

## 2015-12-02 NOTE — Telephone Encounter (Signed)
Pt wife called, states insurance may pay for pulse ox, and is requesting a rx. Please call and advise if Dr. Caryl Comes is able to do this. Pt is coming tomorrow for labs and pick it up then.

## 2015-12-02 NOTE — Telephone Encounter (Signed)
Left voicemail message for her to call back.  

## 2015-12-03 ENCOUNTER — Other Ambulatory Visit (INDEPENDENT_AMBULATORY_CARE_PROVIDER_SITE_OTHER): Payer: PPO

## 2015-12-03 DIAGNOSIS — I482 Chronic atrial fibrillation, unspecified: Secondary | ICD-10-CM

## 2015-12-03 DIAGNOSIS — I5030 Unspecified diastolic (congestive) heart failure: Secondary | ICD-10-CM

## 2015-12-03 NOTE — Telephone Encounter (Signed)
RX mailed to patient for a pulse oximeter.

## 2015-12-03 NOTE — Telephone Encounter (Signed)
Patient came in today to have labs done and wants a prescription for pulse oximeter so that insurance will pay for it. Spoke with Nira Conn RN Dr. Olin Pia nurse and she stated that she would get a script for this and mail to them. Patient verbalized understanding and had no further questions at this time.

## 2015-12-04 DIAGNOSIS — G4733 Obstructive sleep apnea (adult) (pediatric): Secondary | ICD-10-CM | POA: Diagnosis not present

## 2015-12-04 LAB — COMPREHENSIVE METABOLIC PANEL
ALT: 16 IU/L (ref 0–44)
AST: 28 IU/L (ref 0–40)
Albumin/Globulin Ratio: 1.5 (ref 1.2–2.2)
Albumin: 4.2 g/dL (ref 3.5–4.8)
Alkaline Phosphatase: 108 IU/L (ref 39–117)
BUN/Creatinine Ratio: 18 (ref 10–24)
BUN: 22 mg/dL (ref 8–27)
Bilirubin Total: 0.8 mg/dL (ref 0.0–1.2)
CO2: 19 mmol/L (ref 18–29)
Calcium: 9.2 mg/dL (ref 8.6–10.2)
Chloride: 99 mmol/L (ref 96–106)
Creatinine, Ser: 1.21 mg/dL (ref 0.76–1.27)
GFR calc Af Amer: 66 mL/min/{1.73_m2} (ref >59)
GFR calc non Af Amer: 57 mL/min/{1.73_m2} — ABNORMAL LOW (ref >59)
Globulin, Total: 2.8 g/dL (ref 1.5–4.5)
Glucose: 122 mg/dL — ABNORMAL HIGH (ref 65–99)
Potassium: 4.4 mmol/L (ref 3.5–5.2)
Sodium: 140 mmol/L (ref 134–144)
Total Protein: 7 g/dL (ref 6.0–8.5)

## 2015-12-04 LAB — TSH: TSH: 6 u[IU]/mL — ABNORMAL HIGH (ref 0.450–4.500)

## 2015-12-07 DIAGNOSIS — H6123 Impacted cerumen, bilateral: Secondary | ICD-10-CM | POA: Diagnosis not present

## 2015-12-11 ENCOUNTER — Telehealth: Payer: Self-pay | Admitting: Internal Medicine

## 2015-12-11 NOTE — Telephone Encounter (Signed)
New message      Pt wife calling b/c pt has a cold and the pharmacist states that the med for the cold may interfere with his heart med (amiodarone). Please advise.

## 2015-12-11 NOTE — Telephone Encounter (Signed)
Dr. Caryl Comes spoke with the patient's wife regarding his antibiotic and that he should follow up with his PCP for consideration of an alternative that would not interfere with his amiodarone.  Mrs. Murra called back again and I spoke with her about the paperwork she dropped off regarding the [employer] monitor.  We discussed this monitor as an option for the patient if he so chooses.

## 2015-12-11 NOTE — Telephone Encounter (Signed)
I left a message for the patient's wife to call.  

## 2015-12-17 ENCOUNTER — Telehealth: Payer: Self-pay | Admitting: Internal Medicine

## 2015-12-17 NOTE — Telephone Encounter (Signed)
Pt is aware of stable lab results and expressed understanding. Pt's wife has concerns about the weight loss of the pt. Both pt and wife state that since 8/3 OV pt has lost 20 pounds. Pt also has questions as to the duration of the lasix therapy. Pt's wife states that pt has incontinence issues and the lasix often times make the pt urinate on himself. Is concerned as to how long Dr. Caryl Comes wants to keep pt on lasix therapy or if there is an alternative to prevent urination accidents. Pt states he weighed himself today (8/31) at 252 lbs. Per our last OV 8/3 pt was 270 lbs. Pt also states that Dr. Caryl Comes instructed him to start the [place] in which he has been sticking to very strictly and suspects that is the reason for the weight loss. Pt's wife states "there is still minimal swelling in pt's leg but she is not a doctor so she doesn't really know what she is looking for." I instructed them that I would forward all concerns to Dr. Caryl Comes today and that we would be in clinic this afternoon and I would try to get back to her later this afternoon but it may be around 4 or 5. She instructed me that they would be out and about today and to call him/them on her cell phone at [phone]. We were all agreeable to plan.

## 2015-12-17 NOTE — Telephone Encounter (Signed)
New message       Returning a call to get lab results.  Please call cell number

## 2015-12-18 ENCOUNTER — Telehealth: Payer: Self-pay | Admitting: Internal Medicine

## 2015-12-18 NOTE — Telephone Encounter (Signed)
New message        Pt is waiting to hear back from the nurse.  He talked to the nurse yesterday about some concerns----wt loss, etc. Please call

## 2015-12-18 NOTE — Telephone Encounter (Signed)
SPOKE  WITH   PT  . PER PT  IS  FEELING GOOD SWELLING IN LEGS   IS  GONE ,  IS  WANTING  TO KNOW  IF  CAN TRY AND  STOP  TAKING FUROSEMIDE  .PT  IS  UNABLE  TO LEAVE  HOUSE BECAUSE   OF  THE  FLUID  PILL  HAS HAD  SOME INCONTINENT  EPISODES WILL FORWARD TO  DR Caryl Comes FOR REVIEW .Adonis Housekeeper

## 2015-12-18 NOTE — Telephone Encounter (Signed)
Lets reduce to 20 qod Thanks

## 2015-12-23 NOTE — Telephone Encounter (Signed)
IS ALREADY  TAKING  1 QOD .Adonis Housekeeper

## 2016-01-04 DIAGNOSIS — G4733 Obstructive sleep apnea (adult) (pediatric): Secondary | ICD-10-CM | POA: Diagnosis not present

## 2016-01-06 DIAGNOSIS — Z9989 Dependence on other enabling machines and devices: Secondary | ICD-10-CM | POA: Diagnosis not present

## 2016-01-06 DIAGNOSIS — E119 Type 2 diabetes mellitus without complications: Secondary | ICD-10-CM | POA: Diagnosis not present

## 2016-01-06 DIAGNOSIS — I700 Atherosclerosis of aorta: Secondary | ICD-10-CM | POA: Diagnosis not present

## 2016-01-06 DIAGNOSIS — I10 Essential (primary) hypertension: Secondary | ICD-10-CM | POA: Diagnosis not present

## 2016-01-06 DIAGNOSIS — Z23 Encounter for immunization: Secondary | ICD-10-CM | POA: Diagnosis not present

## 2016-01-06 DIAGNOSIS — I2510 Atherosclerotic heart disease of native coronary artery without angina pectoris: Secondary | ICD-10-CM | POA: Diagnosis not present

## 2016-01-06 DIAGNOSIS — I482 Chronic atrial fibrillation: Secondary | ICD-10-CM | POA: Diagnosis not present

## 2016-01-06 DIAGNOSIS — G4733 Obstructive sleep apnea (adult) (pediatric): Secondary | ICD-10-CM | POA: Diagnosis not present

## 2016-01-06 DIAGNOSIS — G20 Parkinson's disease: Secondary | ICD-10-CM | POA: Diagnosis not present

## 2016-01-06 DIAGNOSIS — D696 Thrombocytopenia, unspecified: Secondary | ICD-10-CM | POA: Diagnosis not present

## 2016-01-06 DIAGNOSIS — Z6841 Body Mass Index (BMI) 40.0 and over, adult: Secondary | ICD-10-CM | POA: Diagnosis not present

## 2016-01-12 ENCOUNTER — Telehealth: Payer: Self-pay | Admitting: Internal Medicine

## 2016-01-12 MED ORDER — FUROSEMIDE 40 MG PO TABS: ORAL_TABLET | ORAL | Status: DC

## 2016-01-12 NOTE — Telephone Encounter (Signed)
I attempted to reach the patient/ his wife to follow up with his weights/ any issues with edema per comments on labs dated 12/03/15. I left a message at his home/ wife's cell # that I will call back later.

## 2016-01-12 NOTE — Telephone Encounter (Signed)
The patient called back today. He states that he is doing well. He has been dieting and his weight is down from 270 lbs on 8/3 to 252 lbs.  His breathing is good- he is on C-PAP and is due for follow up with pulmonary on 10/2. He denies any swelling currently. He is still taking lasix 40 mg one tablet every other day. He states "I wish I could get off of that." I advised him if he is doing well with his symptoms, then he could try to cut his lasix back to 20 mg once every other day. He is agreeable and will try this- he will continue to monitor symptoms.

## 2016-01-13 ENCOUNTER — Telehealth: Payer: Self-pay | Admitting: Internal Medicine

## 2016-01-13 DIAGNOSIS — I482 Chronic atrial fibrillation, unspecified: Secondary | ICD-10-CM

## 2016-01-13 DIAGNOSIS — H25813 Combined forms of age-related cataract, bilateral: Secondary | ICD-10-CM | POA: Diagnosis not present

## 2016-01-13 NOTE — Telephone Encounter (Signed)
New message      Calling to let the nurse know pt is not scheduled to have labs drawn at PCP office until jan 2018.  Since he is on fluid pills, will he need to have labs drawn at our office sooner?

## 2016-01-15 NOTE — Telephone Encounter (Signed)
I left a message on Mrs. Montanez' identified voice mail that we should go ahead and recheck his potassium/ kidney function again. Order has been placed. I have asked that she call back to schedule a lab appt in [place] for a day/ time that works best for the patient.

## 2016-01-18 ENCOUNTER — Ambulatory Visit (INDEPENDENT_AMBULATORY_CARE_PROVIDER_SITE_OTHER): Payer: PPO | Admitting: Internal Medicine

## 2016-01-18 ENCOUNTER — Encounter: Payer: Self-pay | Admitting: Internal Medicine

## 2016-01-18 VITALS — BP 138/72 | HR 65 | Ht 66.0 in | Wt 256.0 lb

## 2016-01-18 DIAGNOSIS — G4733 Obstructive sleep apnea (adult) (pediatric): Secondary | ICD-10-CM | POA: Diagnosis not present

## 2016-01-18 DIAGNOSIS — R05 Cough: Secondary | ICD-10-CM | POA: Diagnosis not present

## 2016-01-18 DIAGNOSIS — R059 Cough, unspecified: Secondary | ICD-10-CM

## 2016-01-18 NOTE — Progress Notes (Signed)
[facility] Consultation      MRN# [id number] AH DOOD 1935/07/22   CC: Chief Complaint  Patient presents with  . Follow-up    2 mo rov. pt states he wears cpap avg 8-9hr nightly, feels pressure & mask are okay. DME: [facility]. pt c/o non prod cough X1wk      Brief History: 80 yo M former smoker, history of CAD/CABG/hypertension/allergies on immunotherapy/OSA on CPAP/prior PE, initially seen in consultation for dyspnea, PFT with mixed restriction and obstruction, currently following with pulmonary for OSA optimization.   Events since last clinic visit: Presents today for follow-up of his sleep study. He was diagnosed with OSA in 2010, AHI= 52. He had a sleep titration study done within the last 2 weeks, that showed patient required a CPAP of 12 cm of water with sleep. At his last visit he got a new CPAP machine, currently wearing CPAP nightly with a 93% compliance rate. His average AHI per hour is about 1.5. Overall he states that he's lost about 14 pounds since his last visit, and his breathing is doing very well. He started back to walk as of yesterday. He is accompanied today by his wife. He also stated that he and his wife are going to be doing a modified [employer] to continued weight loss.     Review of Systems  Constitutional: Negative for malaise/fatigue.  Eyes: Negative for blurred vision.  Respiratory: Positive for cough. Negative for hemoptysis, sputum production, shortness of breath and wheezing.   Cardiovascular: Negative for chest pain.  Genitourinary: Negative for dysuria.  Musculoskeletal: Negative for myalgias.  Neurological: Negative for dizziness, weakness and headaches.  Endo/Heme/Allergies: Does not bruise/bleed easily.  Psychiatric/Behavioral: Negative for depression.      Allergies:  Pravastatin and Prednisone  Physical Examination:  VS: BP 138/72 (BP Location: Left Arm, Cuff Size: Normal)   Pulse 65   Ht 5\' 6"   (1.676 m)   Wt 256 lb (116.1 kg)   SpO2 95%   BMI 41.32 kg/m   General Appearance: No distress  HEENT: PERRLA, no ptosis, no other lesions noticed Pulmonary:normal breath sounds., diaphragmatic excursion normal.No wheezing, No rales   Cardiovascular:  Normal S1,S2.  No m/r/g.     Abdomen:Exam: Benign, Soft, non-tender, No masses  Skin:   warm, no rashes, no ecchymosis  Extremities: normal, no cyanosis, clubbing, warm with normal capillary refill.      Assessment and Plan: 80 year old male past medical history of obesity, obstructive sleep apnea, seen in follow-up visit for OSA optimization. Cough Mild cough  - non productive Most likely reactive after getting flu shot.   Plan - supportive care.   OSA (obstructive sleep apnea) Patient with prior sleep study with diagnosis of severe sleep apnea. He had a sleep titration study done on July 2017, that showed that he requires a full facemask CPAP setting of 12 cm of water. Patient states his current machine is more in 78 years old and he is requesting a new machine.  Doing well today with AHI=1.5 on 12cm H2O of CPAP, with 93% compliance.    Plan: -CPAP 12cm H2O with full face mask   Updated Medication List Outpatient Encounter Prescriptions as of 01/18/2016  Medication Sig  . albuterol (VENTOLIN HFA) 108 (90 BASE) MCG/ACT inhaler Inhale 2 puffs into the lungs every 4 (four) hours as needed for wheezing.  [name] amiodarone (PACERONE) 200 MG tablet Take 200 mg by mouth daily.   [name] apixaban (ELIQUIS) 5 MG  TABS tablet Take 5 mg by mouth 2 (two) times daily. Reported on 07/31/2015  . azelastine (ASTELIN) 137 MCG/SPRAY nasal spray Place 2 sprays into the nose daily. Reported on 07/31/2015  . budesonide (PULMICORT) 180 MCG/ACT inhaler Inhale 2 puffs into the lungs 2 (two) times daily as needed (shortness of breath). Reported on 07/31/2015  . busPIRone (BUSPAR) 7.5 MG tablet Take 7.5 mg by mouth 2 (two) times daily.  . carbidopa-levodopa  (PARCOPA) 25-250 MG per disintegrating tablet Take 1 tablet by mouth 6 (six) times daily. Reported on 07/14/2015  . cyclobenzaprine (FLEXERIL) 5 MG tablet 1 tablet by mouth 3 times a day as needed for muscle spasms  . EPINEPHrine 0.3 mg/0.3 mL IJ SOAJ injection Inject 0.3 mg as directed once as needed (allergic reaction). Reported on 10/27/2015  . esomeprazole (NEXIUM) 20 MG capsule Take 40 mg by mouth daily at 12 noon.  . furosemide (LASIX) 40 MG tablet Take 1/2 tablet (20 mg) by mouth once every other day  . mirabegron ER (MYRBETRIQ) 50 MG TB24 tablet Take 50 mg by mouth daily.  [name] oxyCODONE-acetaminophen (PERCOCET/ROXICET) 5-325 MG tablet 1 tablet by mouth evey eight hours as needed for pain  . verapamil (CALAN-SR) 240 MG CR tablet Take 240 mg by mouth daily.   . [DISCONTINUED] cetirizine (ZYRTEC) 10 MG tablet Take 10 mg by mouth daily.  . [DISCONTINUED] metoprolol succinate (TOPROL-XL) 25 MG 24 hr tablet Take 12.5 mg by mouth.   No facility-administered encounter medications on file as of 01/18/2016.     Orders for this visit: No orders of the defined types were placed in this encounter.   Thank  you for the visitation and for allowing  [facility] to assist in the care of your patient. Our recommendations are noted above.  Please contact us if we can be of further service.  Vilinda Boehringer, MD [facility] Office Number: [phone]  Note: This note was prepared with Dragon dictation along with smaller phrase technology. Any transcriptional errors that result from this process are unintentional.

## 2016-01-18 NOTE — Patient Instructions (Signed)
Follow with Dr. Mortimer Fries in 6 months - cont with CPAP nightly - cont with diet and exercise as tolerated

## 2016-01-18 NOTE — Assessment & Plan Note (Signed)
Patient with prior sleep study with diagnosis of severe sleep apnea. He had a sleep titration study done on July 2017, that showed that he requires a full facemask CPAP setting of 12 cm of water. Patient states his current machine is more in 78 years old and he is requesting a new machine.  Doing well today with AHI=1.5 on 12cm H2O of CPAP, with 93% compliance.    Plan: -CPAP 12cm H2O with full face mask

## 2016-01-18 NOTE — Assessment & Plan Note (Signed)
Mild cough  - non productive Most likely reactive after getting flu shot.   Plan - supportive care.

## 2016-01-19 ENCOUNTER — Other Ambulatory Visit (INDEPENDENT_AMBULATORY_CARE_PROVIDER_SITE_OTHER): Payer: PPO | Admitting: *Deleted

## 2016-01-19 DIAGNOSIS — I482 Chronic atrial fibrillation, unspecified: Secondary | ICD-10-CM

## 2016-01-20 LAB — BASIC METABOLIC PANEL
BUN/Creatinine Ratio: 21 (ref 10–24)
BUN: 20 mg/dL (ref 8–27)
CO2: 23 mmol/L (ref 18–29)
Calcium: 9.1 mg/dL (ref 8.6–10.2)
Chloride: 98 mmol/L (ref 96–106)
Creatinine, Ser: 0.94 mg/dL (ref 0.76–1.27)
GFR calc Af Amer: 89 mL/min/{1.73_m2} (ref >59)
GFR calc non Af Amer: 77 mL/min/{1.73_m2} (ref >59)
Glucose: 128 mg/dL — ABNORMAL HIGH (ref 65–99)
Potassium: 4.7 mmol/L (ref 3.5–5.2)
Sodium: 139 mmol/L (ref 134–144)

## 2016-01-29 ENCOUNTER — Inpatient Hospital Stay: Payer: PPO | Attending: Internal Medicine | Admitting: Internal Medicine

## 2016-01-29 ENCOUNTER — Inpatient Hospital Stay: Payer: PPO

## 2016-01-29 VITALS — BP 159/89 | HR 66 | Temp 97.0°F | Ht 67.0 in | Wt 257.6 lb

## 2016-01-29 DIAGNOSIS — D696 Thrombocytopenia, unspecified: Secondary | ICD-10-CM

## 2016-01-29 DIAGNOSIS — F329 Major depressive disorder, single episode, unspecified: Secondary | ICD-10-CM | POA: Diagnosis not present

## 2016-01-29 DIAGNOSIS — G20 Parkinson's disease: Secondary | ICD-10-CM | POA: Insufficient documentation

## 2016-01-29 DIAGNOSIS — I10 Essential (primary) hypertension: Secondary | ICD-10-CM | POA: Insufficient documentation

## 2016-01-29 DIAGNOSIS — K219 Gastro-esophageal reflux disease without esophagitis: Secondary | ICD-10-CM | POA: Diagnosis not present

## 2016-01-29 DIAGNOSIS — Z87891 Personal history of nicotine dependence: Secondary | ICD-10-CM | POA: Insufficient documentation

## 2016-01-29 DIAGNOSIS — G4733 Obstructive sleep apnea (adult) (pediatric): Secondary | ICD-10-CM

## 2016-01-29 DIAGNOSIS — E7800 Pure hypercholesterolemia, unspecified: Secondary | ICD-10-CM | POA: Diagnosis not present

## 2016-01-29 DIAGNOSIS — Z86711 Personal history of pulmonary embolism: Secondary | ICD-10-CM | POA: Insufficient documentation

## 2016-01-29 DIAGNOSIS — D751 Secondary polycythemia: Secondary | ICD-10-CM

## 2016-01-29 DIAGNOSIS — M1990 Unspecified osteoarthritis, unspecified site: Secondary | ICD-10-CM | POA: Insufficient documentation

## 2016-01-29 DIAGNOSIS — Z951 Presence of aortocoronary bypass graft: Secondary | ICD-10-CM | POA: Insufficient documentation

## 2016-01-29 DIAGNOSIS — Z7901 Long term (current) use of anticoagulants: Secondary | ICD-10-CM | POA: Insufficient documentation

## 2016-01-29 DIAGNOSIS — I4891 Unspecified atrial fibrillation: Secondary | ICD-10-CM | POA: Diagnosis not present

## 2016-01-29 DIAGNOSIS — Z79899 Other long term (current) drug therapy: Secondary | ICD-10-CM | POA: Insufficient documentation

## 2016-01-29 DIAGNOSIS — I2510 Atherosclerotic heart disease of native coronary artery without angina pectoris: Secondary | ICD-10-CM | POA: Insufficient documentation

## 2016-01-29 LAB — CBC WITH DIFFERENTIAL/PLATELET
Basophils Absolute: 0.1 10*3/uL (ref 0–0.1)
Basophils Relative: 1 %
Eosinophils Absolute: 0.1 10*3/uL (ref 0–0.7)
Eosinophils Relative: 1 %
HCT: 49.5 % (ref 40.0–52.0)
Hemoglobin: 16.6 g/dL (ref 13.0–18.0)
Lymphocytes Relative: 19 %
Lymphs Abs: 1.9 10*3/uL (ref 1.0–3.6)
MCH: 29.9 pg (ref 26.0–34.0)
MCHC: 33.6 g/dL (ref 32.0–36.0)
MCV: 88.9 fL (ref 80.0–100.0)
Monocytes Absolute: 0.9 10*3/uL (ref 0.2–1.0)
Monocytes Relative: 9 %
Neutro Abs: 7.2 10*3/uL — ABNORMAL HIGH (ref 1.4–6.5)
Neutrophils Relative %: 70 %
Platelets: 139 10*3/uL — ABNORMAL LOW (ref 150–440)
RBC: 5.56 MIL/uL (ref 4.40–5.90)
RDW: 14.7 % — ABNORMAL HIGH (ref 11.5–14.5)
WBC: 10.1 10*3/uL (ref 3.8–10.6)

## 2016-01-29 LAB — COMPREHENSIVE METABOLIC PANEL
ALT: 13 U/L — ABNORMAL LOW (ref 17–63)
AST: 23 U/L (ref 15–41)
Albumin: 3.9 g/dL (ref 3.5–5.0)
Alkaline Phosphatase: 102 U/L (ref 38–126)
Anion gap: 9 (ref 5–15)
BUN: 20 mg/dL (ref 6–20)
CO2: 25 mmol/L (ref 22–32)
Calcium: 9 mg/dL (ref 8.9–10.3)
Chloride: 102 mmol/L (ref 101–111)
Creatinine, Ser: 1.01 mg/dL (ref 0.61–1.24)
GFR calc Af Amer: >60 mL/min (ref >60)
GFR calc non Af Amer: >60 mL/min (ref >60)
Glucose, Bld: 136 mg/dL — ABNORMAL HIGH (ref 65–99)
Potassium: 3.8 mmol/L (ref 3.5–5.1)
Sodium: 136 mmol/L (ref 135–145)
Total Bilirubin: 1 mg/dL (ref 0.3–1.2)
Total Protein: 7.3 g/dL (ref 6.5–8.1)

## 2016-01-29 NOTE — Assessment & Plan Note (Signed)
Mild and chronic- stable, Observe, if less than 100, may increase risk for bleeding with Eliquis.

## 2016-01-29 NOTE — Assessment & Plan Note (Signed)
HCT today is 49.5. He is high risk for stroke given the history of afib, htn, Pe. 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  I was unable to access consultation and progress notes from prior visits dating back to 2016 and earlier to to review work up and to confirm JAK 2 negatviity. 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 Since he has been off testosterone for some time now without much improvement in Hct. I suspect sleep apnea is likely contributing to the rise in HCT, he reports having changed to a newer more comfortable CPAP mask 4 weeks ago.   We will perform 500 cc phlebotomy today and will have him return in 4 weeks to repeat cbc and possiible phelbotomy. I would attempt to keep his hct under 46.  I have not been able to find the JAK 2 report, testing for exon 12 mutattion could be considered and should be discussed at his subsequent visit with his primary oncologist.

## 2016-01-29 NOTE — Assessment & Plan Note (Signed)
Sent a message to PCP, requesting evaluation for oxygen supplemetation at night.

## 2016-01-29 NOTE — Progress Notes (Signed)
[place]  [phone] [phone]  ID: [name] OB: 1935/12/17  MR#: [id number]  [id number]  Patient Care Team: Kirk Ruths, MD as PCP - General (Internal Medicine)  CHIEF COMPLAINT: F/U for erythrocytosis felt to be secondary   INTERVAL HISTORY:  His last Phleboomty was about 6 months gao. Ago He is not currently on testosteorne No H/o stroke He has had Av nodal ablation for afib and is on Eliquis now, No reported bleeding No chest pain , Exercise tolerance is limited to walking slowly due to Sob He had been non compliant to old cpap machine but was  recenetly given a new one that fits more comfortably Not on oxygen   REVIEW OF SYSTEMS:   ROS  As per HPI. Otherwise, a complete review of systems is negative.  PAST MEDICAL HISTORY: Past Medical History:  Diagnosis Date  . A-fib ([place])   . Arthritis   . Coronary artery disease   . Depression   . Dysrhythmia   . GERD (gastroesophageal reflux disease)   . High cholesterol   . Hypertension   . Parkinson's disease ([place])    tremors  . Pulmonary embolism ([place]) 2011  . 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 Shortness of breath dyspnea   . Sinus trouble   . Sleep apnea    wears CPAP    PAST SURGICAL HISTORY: Past Surgical History:  Procedure Laterality Date  . BACK SURGERY  1960  . CHOLECYSTECTOMY    . CORONARY ARTERY BYPASS GRAFT    . ELECTROPHYSIOLOGIC STUDY N/A 07/14/2015   Procedure: CARDIOVERSION;  Surgeon: Yolonda Kida, MD;  Location: [facility] ORS;  Service: Cardiovascular;  Laterality: N/A;  . ELECTROPHYSIOLOGIC STUDY N/A 10/12/2015   Procedure: CARDIOVERSION;  Surgeon: Minna Merritts, MD;  Location: [facility] ORS;  Service: Cardiovascular;  Laterality: N/A;  . OTHER SURGICAL HISTORY  1998   Bypass    FAMILY HISTORY: No family history on file.  ADVANCED DIRECTIVES (Y/N):  N  HEALTH MAINTENANCE: Social History  Substance Use Topics  . Smoking  status: Former Smoker    Packs/day: 1.00    Years: 10.00    Types: Cigarettes, Pipe, Cigars    Quit date: 04/18/1972  . Smokeless tobacco: Former Systems developer    Types: Chew    Quit date: 04/18/1972  . Alcohol use 0.0 oz/week     Comment: 2 drinks per day     Colonoscopy:  PAP:  Bone density:  Lipid panel:  Allergies  Allergen Reactions  . Pravastatin Other (See Comments)  . Prednisone Other (See Comments)    Pt states that med makes him hyper Pt states that med makes him hyper    Current Outpatient Prescriptions  Medication Sig Dispense Refill  . albuterol (VENTOLIN HFA) 108 (90 BASE) MCG/ACT inhaler Inhale 2 puffs into the lungs every 4 (four) hours as needed for wheezing.    [name] amiodarone (PACERONE) 200 MG tablet Take 200 mg by mouth daily.     [name] apixaban (ELIQUIS) 5 MG TABS tablet Take 5 mg by mouth 2 (two) times daily. Reported on 07/31/2015    . azelastine (ASTELIN) 137 MCG/SPRAY nasal spray Place 2 sprays into the nose daily. Reported on 07/31/2015    . budesonide (PULMICORT) 180 MCG/ACT inhaler Inhale 2 puffs into the lungs 2 (two) times daily as needed (shortness of breath). Reported on 07/31/2015    . busPIRone (BUSPAR) 7.5 MG tablet Take 7.5 mg by mouth 2 (two) times daily.    [name]  carbidopa-levodopa (PARCOPA) 25-250 MG per disintegrating tablet Take 1 tablet by mouth 6 (six) times daily. Reported on 07/14/2015    . cyclobenzaprine (FLEXERIL) 5 MG tablet 1 tablet by mouth 3 times a day as needed for muscle spasms    . EPINEPHrine 0.3 mg/0.3 mL IJ SOAJ injection Inject 0.3 mg as directed once as needed (allergic reaction). Reported on 10/27/2015    . esomeprazole (NEXIUM) 20 MG capsule Take 40 mg by mouth daily at 12 noon.    . furosemide (LASIX) 40 MG tablet Take 1/2 tablet (20 mg) by mouth once every other day    . mirabegron ER (MYRBETRIQ) 50 MG TB24 tablet Take 50 mg by mouth daily.    [name] oxyCODONE-acetaminophen (PERCOCET/ROXICET) 5-325 MG tablet 1 tablet by mouth evey eight hours as  needed for pain    . verapamil (CALAN-SR) 240 MG CR tablet Take 240 mg by mouth daily.      No current facility-administered medications for this visit.     OBJECTIVE: Vitals:   01/29/16 1104 01/29/16 1227  BP: (!) 147/84 (!) 159/89  Pulse: 66 66  Temp: 97 F (36.1 C)      Body mass index is 40.35 kg/m.   2.35 meters squared  ECOG FS:2 - Symptomatic, <50% confined to bed  Physical Exam  Constitutional: He is oriented to person, place, and time. He appears well-developed and well-nourished.  obses  HENT:  Head: Normocephalic and atraumatic.  Mouth/Throat: Oropharynx is clear and moist.  facail plethora noted  Eyes: EOM are normal. Pupils are equal, round, and reactive to light.  Neck: Normal range of motion. Neck supple.  Cardiovascular: Normal rate and regular rhythm.   Bilateral leg edema 1+ up to the ankles  Pulmonary/Chest:  Prolonged expiration, hyperinfalted chest  Abdominal:  Obese non tender  Musculoskeletal: Normal range of motion.  Neurological: He is alert and oriented to person, place, and time.  Skin: Skin is warm.     LAB RESULTS:  Lab Results  Component Value Date   NA 136 01/29/2016   K 3.8 01/29/2016   CL 102 01/29/2016   CO2 25 01/29/2016   GLUCOSE 136 (H) 01/29/2016   BUN 20 01/29/2016   CREATININE 1.01 01/29/2016   CALCIUM 9.0 01/29/2016   PROT 7.3 01/29/2016   ALBUMIN 3.9 01/29/2016   AST 23 01/29/2016   ALT 13 (L) 01/29/2016   ALKPHOS 102 01/29/2016   BILITOT 1.0 01/29/2016   GFRNONAA >60 01/29/2016   GFRAA >60 01/29/2016    Lab Results  Component Value Date   WBC 10.1 01/29/2016   NEUTROABS 7.2 (H) 01/29/2016   HGB 16.6 01/29/2016   HCT 49.5 01/29/2016   MCV 88.9 01/29/2016   PLT 139 (L) 01/29/2016     STUDIES: No results found.  ASSESSMENT:    Erythrocytosis HCT today is 49.5. He is high risk for stroke given the history of afib, htn, Pe. He remains on Eliquis for Afibfb, but Aspirin was apparently discontinued by  cardiology.  I was unable to access consultation and progress notes from prior visits dating back to 2016 and earlier to to review work up and to confirm JAK 2 negatviity. 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 Since he has been off testosterone for some time now without much improvement in Hct. I suspect sleep apnea is likely contributing to the rise in HCT, he reports having changed to a newer more comfortable CPAP mask 4 weeks ago.   We  will perform 500 cc phlebotomy today and will have him return in 4 weeks to repeat cbc and possiible phelbotomy. I would attempt to keep his hct under 46.  I have not been able to find the JAK 2 report, testing for exon 12 mutattion could be considered and should be discussed at his subsequent visit with his primary oncologist.     Thrombocytopenia ([place]) Mild and chronic- stable, Observe, if less than 100, may increase risk for bleeding with Eliquis.  OSA (obstructive sleep apnea) Sent a message to PCP, requesting evaluation for oxygen supplemetation at night.    PLAN:    Patient expressed understanding and was in agreement with this plan. He also understands that He can call clinic at any time with any questions, concerns, or complaints.  Orders Placed This Encounter  Procedures  . Phlebotomy therapeutic    Standing Status:   Future    Number of Occurrences:   1    Standing Expiration Date:   01/28/2017    Order Specific Question:   Amount of Blood (ml)    Answer:   350    Comments:   if Hct >46  . CBC with Differential    Standing Status:   Future    Standing Expiration Date:   01/28/2017  . Schedule appointment    Return in about 4 weeks (around 02/26/2016). No matching staging information was found for the patient.  Creola Corn, MD   01/29/2016 1:22 PM

## 2016-01-29 NOTE — Progress Notes (Signed)
Patient here for follow up. Patient has no concerns today.

## 2016-02-03 DIAGNOSIS — G4733 Obstructive sleep apnea (adult) (pediatric): Secondary | ICD-10-CM | POA: Diagnosis not present

## 2016-02-25 ENCOUNTER — Inpatient Hospital Stay: Payer: PPO

## 2016-02-25 ENCOUNTER — Inpatient Hospital Stay: Payer: PPO | Attending: Hematology and Oncology | Admitting: Internal Medicine

## 2016-02-25 DIAGNOSIS — Z86711 Personal history of pulmonary embolism: Secondary | ICD-10-CM | POA: Insufficient documentation

## 2016-02-25 DIAGNOSIS — D696 Thrombocytopenia, unspecified: Secondary | ICD-10-CM | POA: Diagnosis not present

## 2016-02-25 DIAGNOSIS — Z79899 Other long term (current) drug therapy: Secondary | ICD-10-CM | POA: Insufficient documentation

## 2016-02-25 DIAGNOSIS — I2510 Atherosclerotic heart disease of native coronary artery without angina pectoris: Secondary | ICD-10-CM | POA: Diagnosis not present

## 2016-02-25 DIAGNOSIS — D751 Secondary polycythemia: Secondary | ICD-10-CM | POA: Diagnosis not present

## 2016-02-25 DIAGNOSIS — I10 Essential (primary) hypertension: Secondary | ICD-10-CM | POA: Diagnosis not present

## 2016-02-25 DIAGNOSIS — G20 Parkinson's disease: Secondary | ICD-10-CM | POA: Diagnosis not present

## 2016-02-25 DIAGNOSIS — E7800 Pure hypercholesterolemia, unspecified: Secondary | ICD-10-CM | POA: Diagnosis not present

## 2016-02-25 DIAGNOSIS — K219 Gastro-esophageal reflux disease without esophagitis: Secondary | ICD-10-CM | POA: Diagnosis not present

## 2016-02-25 DIAGNOSIS — Z7901 Long term (current) use of anticoagulants: Secondary | ICD-10-CM | POA: Insufficient documentation

## 2016-02-25 DIAGNOSIS — M1990 Unspecified osteoarthritis, unspecified site: Secondary | ICD-10-CM | POA: Diagnosis not present

## 2016-02-25 DIAGNOSIS — Z951 Presence of aortocoronary bypass graft: Secondary | ICD-10-CM | POA: Diagnosis not present

## 2016-02-25 DIAGNOSIS — G4730 Sleep apnea, unspecified: Secondary | ICD-10-CM | POA: Insufficient documentation

## 2016-02-25 DIAGNOSIS — Z87891 Personal history of nicotine dependence: Secondary | ICD-10-CM | POA: Insufficient documentation

## 2016-02-25 DIAGNOSIS — I4891 Unspecified atrial fibrillation: Secondary | ICD-10-CM | POA: Insufficient documentation

## 2016-02-25 DIAGNOSIS — F329 Major depressive disorder, single episode, unspecified: Secondary | ICD-10-CM | POA: Insufficient documentation

## 2016-02-25 LAB — CBC WITH DIFFERENTIAL/PLATELET
Basophils Absolute: 0.1 10*3/uL (ref 0–0.1)
Basophils Relative: 1 %
Eosinophils Absolute: 0.1 10*3/uL (ref 0–0.7)
Eosinophils Relative: 1 %
HCT: 48.4 % (ref 40.0–52.0)
Hemoglobin: 16.1 g/dL (ref 13.0–18.0)
Lymphocytes Relative: 19 %
Lymphs Abs: 2 10*3/uL (ref 1.0–3.6)
MCH: 29.5 pg (ref 26.0–34.0)
MCHC: 33.3 g/dL (ref 32.0–36.0)
MCV: 88.8 fL (ref 80.0–100.0)
Monocytes Absolute: 1 10*3/uL (ref 0.2–1.0)
Monocytes Relative: 9 %
Neutro Abs: 7.6 10*3/uL — ABNORMAL HIGH (ref 1.4–6.5)
Neutrophils Relative %: 70 %
Platelets: 143 10*3/uL — ABNORMAL LOW (ref 150–440)
RBC: 5.45 MIL/uL (ref 4.40–5.90)
RDW: 14.3 % (ref 11.5–14.5)
WBC: 10.8 10*3/uL — ABNORMAL HIGH (ref 3.8–10.6)

## 2016-02-25 NOTE — Progress Notes (Signed)
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 ? Alcohol/fatty liver/? [April 2016]  # PE [Feb 2011 s/p chole; [facility] in [place], [place] currently off Coumadin; April 2017- Afib- Start eliquis [Dr.Calwood]  INTERVAL HISTORY:  80 year old male patient with above history of secondary erythrocytosis from testosterone therapy/CPAP and history of A. fib on Elquis here for follow-up.  Patient had phlebotomy done when his hematocrit was at 49 few weeks ago; he does not notice any significant improvement in symptoms.  Patient denies any blood in stools black stools. Appetite is fair. He admits to drinking alcohol occasionally weekly basis. He denies any chest pain or shortness of breath or cough.   REVIEW OF SYSTEMS:  A complete 10 point review of system is done which is negative except mentioned above/history of present illness.   PAST MEDICAL HISTORY :  Past Medical History:  Diagnosis Date  . A-fib ([place])   . Arthritis   . Coronary artery disease   . Depression   . Dysrhythmia   . GERD (gastroesophageal reflux disease)   . High cholesterol   . Hypertension   . Parkinson's disease ([place])    tremors  . Pulmonary embolism ([place]) 2011  . 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 Shortness of breath dyspnea   . Sinus trouble   . Sleep apnea    wears CPAP    PAST SURGICAL HISTORY :   Past Surgical History:  Procedure Laterality Date  . BACK SURGERY  1960  . CHOLECYSTECTOMY    . CORONARY ARTERY BYPASS GRAFT    . ELECTROPHYSIOLOGIC STUDY N/A 07/14/2015   Procedure: CARDIOVERSION;  Surgeon: Yolonda Kida, MD;  Location: [facility] ORS;  Service: Cardiovascular;  Laterality: N/A;  . ELECTROPHYSIOLOGIC  STUDY N/A 10/12/2015   Procedure: CARDIOVERSION;  Surgeon: Minna Merritts, MD;  Location: [facility] ORS;  Service: Cardiovascular;  Laterality: N/A;  . OTHER SURGICAL HISTORY  1998   Bypass    FAMILY HISTORY :  No family history on file.  SOCIAL HISTORY:   Social History  Substance Use Topics  . Smoking status: Former Smoker    Packs/day: 1.00    Years: 10.00    Types: Cigarettes, Pipe, Cigars    Quit date: 04/18/1972  . Smokeless tobacco: Former Systems developer    Types: Chew    Quit date: 04/18/1972  . Alcohol use 0.0 oz/week     Comment: 2 drinks per day    ALLERGIES:  is allergic to pravastatin and prednisone.  MEDICATIONS:  Current Outpatient Prescriptions  Medication Sig Dispense Refill  . amiodarone (PACERONE) 200 MG tablet Take 200 mg by mouth daily.     [name] apixaban (ELIQUIS) 5 MG TABS tablet Take 5 mg by mouth 2 (two) times daily. Reported on 07/31/2015    . azelastine (ASTELIN) 137 MCG/SPRAY nasal spray Place 2 sprays into the nose daily. Reported on 07/31/2015    . busPIRone (BUSPAR) 7.5 MG tablet Take 7.5 mg by mouth 2 (two) times daily.    . carbidopa-levodopa (PARCOPA) 25-250 MG per disintegrating tablet Take 1 tablet by mouth 6 (six) times daily. Reported on 07/14/2015    . cyclobenzaprine (FLEXERIL) 5 MG tablet 2 tablet by mouth 3 times a day as needed for muscle spasms    . esomeprazole (  NEXIUM) 20 MG capsule Take 40 mg by mouth daily at 12 noon.    . furosemide (LASIX) 40 MG tablet Take 1/2 tablet (20 mg) by mouth once every other day    . mirabegron ER (MYRBETRIQ) 50 MG TB24 tablet Take 50 mg by mouth daily.    [name] oxyCODONE-acetaminophen (PERCOCET/ROXICET) 5-325 MG tablet 1 tablet by mouth evey eight hours as needed for pain    . verapamil (CALAN-SR) 240 MG CR tablet Take 240 mg by mouth daily.     [name] albuterol (VENTOLIN HFA) 108 (90 BASE) MCG/ACT inhaler Inhale 2 puffs into the lungs every 4 (four) hours as needed for wheezing.    . budesonide (PULMICORT) 180 MCG/ACT inhaler Inhale  2 puffs into the lungs 2 (two) times daily as needed (shortness of breath). Reported on 07/31/2015    . EPINEPHrine 0.3 mg/0.3 mL IJ SOAJ injection Inject 0.3 mg as directed once as needed (allergic reaction). Reported on 10/27/2015     No current facility-administered medications for this visit.     PHYSICAL EXAMINATION:   BP (!) 161/80 (BP Location: Left Arm, Patient Position: Sitting)   Pulse 60   Temp 97.6 F (36.4 C) (Tympanic)   Resp 20   Ht 5\' 7"  (1.702 m)   Wt 260 lb (117.9 kg)   BMI 40.72 kg/m   Filed Weights   02/25/16 1359  Weight: 260 lb (117.9 kg)    GENERAL: Well-nourished well-developed; Alert, no distress and comfortable. Obese. Accompanied by his wife. EYES: no pallor or icterus OROPHARYNX: no thrush or ulceration NECK: supple, no masses felt LYMPH:  no palpable lymphadenopathy in the cervical, axillary or inguinal regions LUNGS: clear to auscultation and  No wheeze or crackles HEART/CVS: regular rate & rhythm and no murmurs; No lower extremity edema ABDOMEN:abdomen soft, non-tender and normal bowel sounds Musculoskeletal:no cyanosis of digits and no clubbing  PSYCH: alert & oriented x 3 with fluent speech NEURO: no focal motor/sensory deficits SKIN:  no rashes or significant lesions  LABORATORY DATA:  I have reviewed the data as listed    Component Value Date/Time   NA 136 01/29/2016 1030   NA 139 01/19/2016 0928   K 3.8 01/29/2016 1030   CL 102 01/29/2016 1030   CO2 25 01/29/2016 1030   GLUCOSE 136 (H) 01/29/2016 1030   BUN 20 01/29/2016 1030   BUN 20 01/19/2016 0928   CREATININE 1.01 01/29/2016 1030   CREATININE 0.95 12/06/2011 1554   CALCIUM 9.0 01/29/2016 1030   PROT 7.3 01/29/2016 1030   PROT 7.0 12/03/2015 0842   PROT 7.6 09/20/2011 1529   ALBUMIN 3.9 01/29/2016 1030   ALBUMIN 4.2 12/03/2015 0842   ALBUMIN 3.7 09/20/2011 1529   AST 23 01/29/2016 1030   AST 18 09/20/2011 1529   ALT 13 (L) 01/29/2016 1030   ALT 23 09/20/2011 1529    ALKPHOS 102 01/29/2016 1030   ALKPHOS 133 09/20/2011 1529   BILITOT 1.0 01/29/2016 1030   BILITOT 0.8 12/03/2015 0842   BILITOT 0.5 09/20/2011 1529   GFRNONAA >60 01/29/2016 1030   GFRNONAA >60 12/06/2011 1554   GFRAA >60 01/29/2016 1030   GFRAA >60 12/06/2011 1554    No results found for: SPEP, UPEP  Lab Results  Component Value Date   WBC 10.8 (H) 02/25/2016   NEUTROABS 7.6 (H) 02/25/2016   HGB 16.1 02/25/2016   HCT 48.4 02/25/2016   MCV 88.8 02/25/2016   PLT 143 (L) 02/25/2016      Chemistry  Component Value Date/Time   NA 136 01/29/2016 1030   NA 139 01/19/2016 0928   K 3.8 01/29/2016 1030   CL 102 01/29/2016 1030   CO2 25 01/29/2016 1030   BUN 20 01/29/2016 1030   BUN 20 01/19/2016 0928   CREATININE 1.01 01/29/2016 1030   CREATININE 0.95 12/06/2011 1554      Component Value Date/Time   CALCIUM 9.0 01/29/2016 1030   ALKPHOS 102 01/29/2016 1030   ALKPHOS 133 09/20/2011 1529   AST 23 01/29/2016 1030   AST 18 09/20/2011 1529   ALT 13 (L) 01/29/2016 1030   ALT 23 09/20/2011 1529   BILITOT 1.0 01/29/2016 1030   BILITOT 0.8 12/03/2015 0842   BILITOT 0.5 09/20/2011 1529        ASSESSMENT & PLAN:   Erythrocytosis # 2011- 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 Patient is currently off testosterone therapy. Today hemoglobin is 15.5 hematocrit 46. Patient in general did not feel any different after his phlebotomies. I would not recommend any further phlebotomies unless his hematocrit is greater than 52.  # Mild thrombocytopenia platelets 120 to 140s-  question secondary to alcohol versus liver disease. No cirrhosis or splenomegaly is noted on the previous CAT scan or ultrasound. Monitor for now. It's okay from hematology standpoint to be on Eliquis- as his platelets are above 100,000.  # A. Fib- on Eliquis per cardiology/C plan above.   # cbcn 3 months/ phlebotomy; follow up with MD/labs in 12 months.         Cammie Sickle, MD 02/25/2016 5:00 PM

## 2016-02-25 NOTE — Assessment & Plan Note (Addendum)
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 Patient is currently off testosterone therapy. Today hemoglobin is 15.5 hematocrit 46. Patient in general did not feel any different after his phlebotomies. I would not recommend any further phlebotomies unless his hematocrit is greater than 52.  # Mild thrombocytopenia platelets 120 to 140s-  question secondary to alcohol versus liver disease. No cirrhosis or splenomegaly is noted on the previous CAT scan or ultrasound. Monitor for now. It's okay from hematology standpoint to be on Eliquis- as his platelets are above 100,000.  # A. Fib- on Eliquis per cardiology/C plan above.   # cbcn 3 months/ phlebotomy; follow up with MD/labs in 12 months.

## 2016-02-26 ENCOUNTER — Inpatient Hospital Stay: Payer: PPO | Admitting: Hematology and Oncology

## 2016-02-26 ENCOUNTER — Inpatient Hospital Stay: Payer: PPO

## 2016-02-26 ENCOUNTER — Ambulatory Visit: Payer: PPO | Admitting: Hematology and Oncology

## 2016-02-26 ENCOUNTER — Other Ambulatory Visit: Payer: PPO

## 2016-03-05 DIAGNOSIS — G4733 Obstructive sleep apnea (adult) (pediatric): Secondary | ICD-10-CM | POA: Diagnosis not present

## 2016-03-25 ENCOUNTER — Other Ambulatory Visit: Payer: Self-pay | Admitting: *Deleted

## 2016-03-25 DIAGNOSIS — D696 Thrombocytopenia, unspecified: Secondary | ICD-10-CM

## 2016-03-25 DIAGNOSIS — D751 Secondary polycythemia: Secondary | ICD-10-CM

## 2016-04-04 DIAGNOSIS — G4733 Obstructive sleep apnea (adult) (pediatric): Secondary | ICD-10-CM | POA: Diagnosis not present

## 2016-04-13 DIAGNOSIS — R350 Frequency of micturition: Secondary | ICD-10-CM | POA: Diagnosis not present

## 2016-04-13 DIAGNOSIS — R351 Nocturia: Secondary | ICD-10-CM | POA: Diagnosis not present

## 2016-04-29 DIAGNOSIS — G4733 Obstructive sleep apnea (adult) (pediatric): Secondary | ICD-10-CM | POA: Diagnosis not present

## 2016-05-03 DIAGNOSIS — I2510 Atherosclerotic heart disease of native coronary artery without angina pectoris: Secondary | ICD-10-CM | POA: Diagnosis not present

## 2016-05-03 DIAGNOSIS — E119 Type 2 diabetes mellitus without complications: Secondary | ICD-10-CM | POA: Diagnosis not present

## 2016-05-05 DIAGNOSIS — G4733 Obstructive sleep apnea (adult) (pediatric): Secondary | ICD-10-CM | POA: Diagnosis not present

## 2016-05-10 DIAGNOSIS — Z0000 Encounter for general adult medical examination without abnormal findings: Secondary | ICD-10-CM | POA: Diagnosis not present

## 2016-05-10 DIAGNOSIS — Z6841 Body Mass Index (BMI) 40.0 and over, adult: Secondary | ICD-10-CM | POA: Diagnosis not present

## 2016-05-10 DIAGNOSIS — D696 Thrombocytopenia, unspecified: Secondary | ICD-10-CM | POA: Diagnosis not present

## 2016-05-10 DIAGNOSIS — G20 Parkinson's disease: Secondary | ICD-10-CM | POA: Diagnosis not present

## 2016-05-10 DIAGNOSIS — E119 Type 2 diabetes mellitus without complications: Secondary | ICD-10-CM | POA: Diagnosis not present

## 2016-05-10 DIAGNOSIS — I482 Chronic atrial fibrillation: Secondary | ICD-10-CM | POA: Diagnosis not present

## 2016-05-10 DIAGNOSIS — I700 Atherosclerosis of aorta: Secondary | ICD-10-CM | POA: Diagnosis not present

## 2016-05-10 DIAGNOSIS — I2510 Atherosclerotic heart disease of native coronary artery without angina pectoris: Secondary | ICD-10-CM | POA: Diagnosis not present

## 2016-05-19 ENCOUNTER — Encounter: Payer: Self-pay | Admitting: Internal Medicine

## 2016-05-19 ENCOUNTER — Ambulatory Visit (INDEPENDENT_AMBULATORY_CARE_PROVIDER_SITE_OTHER): Payer: PPO | Admitting: Internal Medicine

## 2016-05-19 ENCOUNTER — Other Ambulatory Visit
Admission: RE | Admit: 2016-05-19 | Discharge: 2016-05-19 | Disposition: A | Discharge status: Home / Self Care | Payer: PPO | Source: Ambulatory Visit | Attending: Internal Medicine | Admitting: Internal Medicine

## 2016-05-19 VITALS — BP 134/64 | HR 64 | Ht 67.0 in | Wt 258.5 lb

## 2016-05-19 DIAGNOSIS — I482 Chronic atrial fibrillation, unspecified: Secondary | ICD-10-CM

## 2016-05-19 DIAGNOSIS — I5030 Unspecified diastolic (congestive) heart failure: Secondary | ICD-10-CM

## 2016-05-19 LAB — TSH: TSH: 5.368 u[IU]/mL — ABNORMAL HIGH (ref 0.350–4.500)

## 2016-05-19 NOTE — Progress Notes (Signed)
Patient Care Team: Kirk Ruths, MD as PCP - General (Internal Medicine)   HPI  Andre [name] is a 81 y.o. male Seen in follow-up for exercise intolerance in the context of atrial fibrillation. It had been unassociated with palpitations. He does undergone cardioversion 3/17 with rapid reversion and was subsequently treated with amiodarone. He has continued on this. Cardioversion was anticipated after he was seen in early June. This was done 6/17  Sinus bradycardia has prompted down titration of rate controlling medications    Records and Results Reviewed  VEchocardiogram 2/17 demonstrated normal left ventricular function mild left ventricular hypertrophy (1.3/1 cm) and left atrial enlargement  (5.0 cm)  Date TSH LFTs PFTs  8/17 6.0 16    /          His diet is replete with sodium  He is much better holding sinus;  Tolerating x for sun sensitivity   Past Medical History:  Diagnosis Date  . A-fib ([place])   . Arthritis   . Coronary artery disease   . Depression   . Dysrhythmia   . GERD (gastroesophageal reflux disease)   . High cholesterol   . Hypertension   . Parkinson's disease ([place])    tremors  . Pulmonary embolism ([place]) 2011  . 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 Shortness of breath dyspnea   . Sinus trouble   . Sleep apnea    wears CPAP    Past Surgical History:  Procedure Laterality Date  . BACK SURGERY  1960  . CHOLECYSTECTOMY    . CORONARY ARTERY BYPASS GRAFT    . ELECTROPHYSIOLOGIC STUDY N/A 07/14/2015   Procedure: CARDIOVERSION;  Surgeon: Yolonda Kida, MD;  Location: [facility] ORS;  Service: Cardiovascular;  Laterality: N/A;  . ELECTROPHYSIOLOGIC STUDY N/A 10/12/2015   Procedure: CARDIOVERSION;  Surgeon: Minna Merritts, MD;  Location: [facility] ORS;  Service: Cardiovascular;  Laterality: N/A;  . OTHER SURGICAL HISTORY  1998   Bypass    Current Outpatient Prescriptions  Medication Sig Dispense Refill  . albuterol (VENTOLIN HFA) 108 (90  BASE) MCG/ACT inhaler Inhale 2 puffs into the lungs every 4 (four) hours as needed for wheezing.    [name] amiodarone (PACERONE) 200 MG tablet Take 200 mg by mouth daily.     [name] apixaban (ELIQUIS) 5 MG TABS tablet Take 5 mg by mouth 2 (two) times daily. Reported on 07/31/2015    . azelastine (ASTELIN) 137 MCG/SPRAY nasal spray Place 2 sprays into the nose daily. Reported on 07/31/2015    . budesonide (PULMICORT) 180 MCG/ACT inhaler Inhale 2 puffs into the lungs 2 (two) times daily as needed (shortness of breath). Reported on 07/31/2015    . busPIRone (BUSPAR) 7.5 MG tablet Take 7.5 mg by mouth 2 (two) times daily.    . carbidopa-levodopa (PARCOPA) 25-250 MG per disintegrating tablet Take 1 tablet by mouth 6 (six) times daily. Reported on 07/14/2015    . EPINEPHrine 0.3 mg/0.3 mL IJ SOAJ injection Inject 0.3 mg as directed once as needed (allergic reaction). Reported on 10/27/2015    . esomeprazole (NEXIUM) 20 MG capsule Take 40 mg by mouth daily at 12 noon.    . furosemide (LASIX) 40 MG tablet Take 1/2 tablet (20 mg) by mouth once every other day    . mirabegron ER (MYRBETRIQ) 50 MG TB24 tablet Take 50 mg by mouth daily.    [name] oxyCODONE-acetaminophen (PERCOCET/ROXICET) 5-325 MG tablet 1 tablet by mouth evey eight hours as needed for pain    .  verapamil (CALAN-SR) 240 MG CR tablet Take 240 mg by mouth daily.      No current facility-administered medications for this visit.     Allergies  Allergen Reactions  . Pravastatin Other (See Comments)  . Prednisone Other (See Comments)    Pt states that med makes him hyper Pt states that med makes him hyper      Review of Systems negative except from HPI and PMH  Physical Exam BP 134/64 (BP Location: Left Arm, Patient Position: Sitting, Cuff Size: Large)   Pulse 64   Ht 5\' 7"  (1.702 m)   Wt 258 lb 8 oz (117.3 kg)   BMI 40.49 kg/m  Well developed and well nourished in no acute distress  Morbidly obese HENT normal E scleral and icterus clear Neck  Supple JVP 8--9  carotids brisk and full Clear to ausculation   Regular rate and rhythm, no murmurs gallops or rub Soft with active bowel sounds No clubbing cyanosis  1 Edema Alert and oriented, grossly normal motor and sensory function Skin Warm and Dry     Assessment and Plan:  Morbid obesity and sleep apnea  HFpEF  Atrial fibrillation-persistent  Depression   He is much better holding sinus rhythm  Will need to check amio labs]   Euvolemic continue current meds  He continues to work on weight loss, and despite our values, he says he has lost 15 lbs or so  \

## 2016-05-19 NOTE — Patient Instructions (Addendum)
Medication Instructions: - Your physician recommends that you continue on your current medications as directed. Please refer to the Current Medication list given to you today.  Labwork: - Your physician recommends that you have lab work today: TSH   Procedures/Testing: - none ordered  Follow-Up: - Your physician wants you to follow-up in: 6 months with Dr. Caryl Comes. You will receive a reminder letter in the mail two months in advance. If you don't receive a letter, please call our office to schedule the follow-up appointment.   Any Additional Special Instructions Will Be Listed Below (If Applicable).     If you need a refill on your cardiac medications before your next appointment, please call your pharmacy.

## 2016-05-23 ENCOUNTER — Telehealth: Payer: Self-pay | Admitting: Internal Medicine

## 2016-05-23 NOTE — Telephone Encounter (Signed)
Pt wife calling asking if patient still needs to take his eliquis  He was told he doesn't have AFIB anymore and so they would like to know if he needs to be on it anymore Please advise.

## 2016-05-23 NOTE — Telephone Encounter (Signed)
No answer. Left message to call back.   

## 2016-05-23 NOTE — Telephone Encounter (Signed)
Received incoming call from patient and wife. Advised that at last office visit with Dr Caryl Comes on 05/19/16, patient was advised to continue current medications and no changes were noted concerning his Eliquis. Patient stated he does not know when he goes in or out of A fib.  Patient/wife verbalized understanding.

## 2016-05-30 ENCOUNTER — Inpatient Hospital Stay: Payer: PPO | Attending: Hematology and Oncology

## 2016-05-30 ENCOUNTER — Inpatient Hospital Stay: Payer: PPO

## 2016-05-30 DIAGNOSIS — I2510 Atherosclerotic heart disease of native coronary artery without angina pectoris: Secondary | ICD-10-CM | POA: Insufficient documentation

## 2016-05-30 DIAGNOSIS — Z951 Presence of aortocoronary bypass graft: Secondary | ICD-10-CM | POA: Diagnosis not present

## 2016-05-30 DIAGNOSIS — F329 Major depressive disorder, single episode, unspecified: Secondary | ICD-10-CM | POA: Insufficient documentation

## 2016-05-30 DIAGNOSIS — E7800 Pure hypercholesterolemia, unspecified: Secondary | ICD-10-CM | POA: Insufficient documentation

## 2016-05-30 DIAGNOSIS — G20 Parkinson's disease: Secondary | ICD-10-CM | POA: Insufficient documentation

## 2016-05-30 DIAGNOSIS — Z87891 Personal history of nicotine dependence: Secondary | ICD-10-CM | POA: Diagnosis not present

## 2016-05-30 DIAGNOSIS — Z79899 Other long term (current) drug therapy: Secondary | ICD-10-CM | POA: Insufficient documentation

## 2016-05-30 DIAGNOSIS — G4730 Sleep apnea, unspecified: Secondary | ICD-10-CM | POA: Diagnosis not present

## 2016-05-30 DIAGNOSIS — Z86711 Personal history of pulmonary embolism: Secondary | ICD-10-CM | POA: Diagnosis not present

## 2016-05-30 DIAGNOSIS — Z7901 Long term (current) use of anticoagulants: Secondary | ICD-10-CM | POA: Diagnosis not present

## 2016-05-30 DIAGNOSIS — D696 Thrombocytopenia, unspecified: Secondary | ICD-10-CM | POA: Insufficient documentation

## 2016-05-30 DIAGNOSIS — D751 Secondary polycythemia: Secondary | ICD-10-CM | POA: Insufficient documentation

## 2016-05-30 DIAGNOSIS — I10 Essential (primary) hypertension: Secondary | ICD-10-CM | POA: Diagnosis not present

## 2016-05-30 DIAGNOSIS — I4891 Unspecified atrial fibrillation: Secondary | ICD-10-CM | POA: Insufficient documentation

## 2016-05-30 DIAGNOSIS — M1990 Unspecified osteoarthritis, unspecified site: Secondary | ICD-10-CM | POA: Diagnosis not present

## 2016-05-30 DIAGNOSIS — K219 Gastro-esophageal reflux disease without esophagitis: Secondary | ICD-10-CM | POA: Insufficient documentation

## 2016-05-30 LAB — CBC WITH DIFFERENTIAL/PLATELET
Basophils Absolute: 0.1 10*3/uL (ref 0–0.1)
Basophils Relative: 1 %
Eosinophils Absolute: 0.1 10*3/uL (ref 0–0.7)
Eosinophils Relative: 1 %
HCT: 48.2 % (ref 40.0–52.0)
Hemoglobin: 16.2 g/dL (ref 13.0–18.0)
Lymphocytes Relative: 21 %
Lymphs Abs: 1.8 10*3/uL (ref 1.0–3.6)
MCH: 29.4 pg (ref 26.0–34.0)
MCHC: 33.6 g/dL (ref 32.0–36.0)
MCV: 87.6 fL (ref 80.0–100.0)
Monocytes Absolute: 0.7 10*3/uL (ref 0.2–1.0)
Monocytes Relative: 8 %
Neutro Abs: 6 10*3/uL (ref 1.4–6.5)
Neutrophils Relative %: 69 %
Platelets: 145 10*3/uL — ABNORMAL LOW (ref 150–440)
RBC: 5.51 MIL/uL (ref 4.40–5.90)
RDW: 14.8 % — ABNORMAL HIGH (ref 11.5–14.5)
WBC: 8.5 10*3/uL (ref 3.8–10.6)

## 2016-06-05 DIAGNOSIS — G4733 Obstructive sleep apnea (adult) (pediatric): Secondary | ICD-10-CM | POA: Diagnosis not present

## 2016-06-06 ENCOUNTER — Telehealth: Payer: Self-pay | Admitting: Internal Medicine

## 2016-06-06 NOTE — Telephone Encounter (Signed)
Pt states he has been in touch with Dr Tonette Bihari office about follow up on TSH results done at time of last office visit with Dr Caryl Comes, was told by Dr Tonette Bihari office to repeat in 8 weeks.  Pt is concerned about mood swings and weight gain, pt advised to touch base with Dr Tonette Bihari office about concerns if he had not discussed with Dr Ouida Sills previously. Pt agreed.

## 2016-06-06 NOTE — Telephone Encounter (Signed)
New Message  Pts wife voiced wanting nurse to give her a call.  Please f/u

## 2016-06-15 DIAGNOSIS — L570 Actinic keratosis: Secondary | ICD-10-CM | POA: Diagnosis not present

## 2016-06-15 DIAGNOSIS — L578 Other skin changes due to chronic exposure to nonionizing radiation: Secondary | ICD-10-CM | POA: Diagnosis not present

## 2016-06-15 DIAGNOSIS — L219 Seborrheic dermatitis, unspecified: Secondary | ICD-10-CM | POA: Diagnosis not present

## 2016-06-24 ENCOUNTER — Telehealth: Payer: Self-pay | Admitting: Internal Medicine

## 2016-06-24 NOTE — Telephone Encounter (Signed)
I left a message on Mrs. Enis' identified voice mail that the patient may be prone to sun sensitivity with the amiodarone and blood thinner. I have advised that he wear sunscreen and a hat if outside for any length of time.   I have asked that she call back if he is having a more severe reaction.

## 2016-06-24 NOTE — Telephone Encounter (Signed)
New Message  Pt c/o medication issue:  1. Name of Medication: amiodarone pacerone 200 mg tablet once daily  2. How are you currently taking this medication (dosage and times per day)? See above  3. Are you having a reaction (difficulty breathing--STAT)? N/A  4. What is your medication issue? Pts wife voiced this medication causes the pt to be sensitive to the sun.  Please f/u

## 2016-06-30 ENCOUNTER — Encounter: Payer: Self-pay | Admitting: Neurology

## 2016-06-30 ENCOUNTER — Ambulatory Visit (INDEPENDENT_AMBULATORY_CARE_PROVIDER_SITE_OTHER): Payer: PPO | Admitting: Neurology

## 2016-06-30 DIAGNOSIS — G20 Parkinson's disease: Secondary | ICD-10-CM | POA: Diagnosis not present

## 2016-06-30 NOTE — Progress Notes (Signed)
Reason for visit: Parkinson's disease  Referring physician: Dr. Roxy Manns is a 81 y.o. male  History of present illness:  Andre [name] is a 81 year old right-handed white male with a history of Parkinson's disease that initially was diagnosed in 2010. The patient has tremors that involved the left arm only, he has had relatively good mobility, he denies any falls. He does note that he may drool at night with sleeping, but not during the day. He denies any problems with swallowing. He is right-handed, and he does not have problems with handwriting. He is relatively inactive, he has become overweight. The patient does not have a regular exercising regimen. He claims that taking Sinemet on an empty stomach results in severe nausea, he takes this medication with food. He has had some troubles at nighttime with sleeping, he can get to sleep fairly well, but he will wake up after 2-3 hours to use the bathroom and he cannot go back to sleep because his resting tremor. He has some mild forgetfulness, he denies significant memory issues. He denies any weakness or numbness of the extremities. He does not know much about his family history in regards to any other individuals with Parkinson's disease. The patient has sleep apnea, he is on CPAP at night. He is sent to this office for an evaluation. In the past, he has been on selegiline and Cogentin, but he is no longer on these medications.  Past Medical History:  Diagnosis Date  . A-fib ([place])   . Arthritis   . Coronary artery disease   . Depression   . Dysrhythmia   . GERD (gastroesophageal reflux disease)   . High cholesterol   . Hypertension   . Parkinson's disease ([place])    tremors  . Pulmonary embolism ([place]) 2011  . 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 Shortness of breath dyspnea   . Sinus trouble   . Sleep apnea    wears CPAP    Past Surgical History:  Procedure Laterality Date  . BACK SURGERY  1960  . CHOLECYSTECTOMY      . CORONARY ARTERY BYPASS GRAFT    . ELECTROPHYSIOLOGIC STUDY N/A 07/14/2015   Procedure: CARDIOVERSION;  Surgeon: Yolonda Kida, MD;  Location: [facility] ORS;  Service: Cardiovascular;  Laterality: N/A;  . ELECTROPHYSIOLOGIC STUDY N/A 10/12/2015   Procedure: CARDIOVERSION;  Surgeon: Minna Merritts, MD;  Location: [facility] ORS;  Service: Cardiovascular;  Laterality: N/A;  . OTHER SURGICAL HISTORY  1998   Bypass    History reviewed. No pertinent family history.  Social history:  reports that he quit smoking about 44 years ago. His smoking use included Cigarettes, Pipe, and Cigars. He has a 10.00 pack-year smoking history. He quit smokeless tobacco use about 44 years ago. His smokeless tobacco use included Chew. He reports that he drinks alcohol. He reports that he does not use drugs.  Medications:  Prior to Admission medications   Medication Sig Start Date End Date Taking? Authorizing Provider  albuterol (VENTOLIN HFA) 108 (90 BASE) MCG/ACT inhaler Inhale 2 puffs into the lungs every 4 (four) hours as needed for wheezing.   Yes Historical Provider, MD  amiodarone (PACERONE) 200 MG tablet Take 200 mg by mouth daily.  07/30/15  Yes Historical Provider, MD  apixaban (ELIQUIS) 5 MG TABS tablet Take 5 mg by mouth 2 (two) times daily. Reported on 07/31/2015   Yes Historical Provider, MD  budesonide (PULMICORT) 180 MCG/ACT inhaler Inhale 2 puffs into the lungs 2 (  two) times daily as needed (shortness of breath). Reported on 07/31/2015   Yes Historical Provider, MD  busPIRone (BUSPAR) 7.5 MG tablet Take 7.5 mg by mouth 2 (two) times daily.   Yes Historical Provider, MD  carbidopa-levodopa (PARCOPA) 25-250 MG per disintegrating tablet Take 1 tablet by mouth 6 (six) times daily. Reported on 07/14/2015   Yes Historical Provider, MD  cyclobenzaprine (FLEXERIL) 5 MG tablet Take 5 mg by mouth daily.   Yes Historical Provider, MD  EPINEPHrine 0.3 mg/0.3 mL IJ SOAJ injection Inject 0.3 mg as directed once as needed  (allergic reaction). Reported on 10/27/2015 07/01/13  Yes Historical Provider, MD  esomeprazole (NEXIUM) 20 MG capsule Take 40 mg by mouth daily at 12 noon.   Yes Historical Provider, MD  furosemide (LASIX) 40 MG tablet Take 1/2 tablet (20 mg) by mouth once every other day 01/12/16  Yes Deboraha Sprang, MD  mirabegron ER (MYRBETRIQ) 50 MG TB24 tablet Take 50 mg by mouth daily.   Yes Historical Provider, MD  oxyCODONE-acetaminophen (PERCOCET/ROXICET) 5-325 MG tablet 1 tablet by mouth evey eight hours as needed for pain 11/29/14  Yes Historical Provider, MD  verapamil (CALAN-SR) 240 MG CR tablet Take 240 mg by mouth daily.  10/30/13  Yes Historical Provider, MD      Allergies  Allergen Reactions  . Pravastatin Other (See Comments)  . Prednisone Other (See Comments)    Pt states that med makes him hyper Pt states that med makes him hyper    ROS:  Out of a complete 14 system review of symptoms, the patient complains only of the following symptoms, and all other reviewed systems are negative.  Weight gain Blurred vision Easy bruising  Blood pressure (!) 181/80, pulse 61, height 5\' 7"  (1.702 m), weight 258 lb 8 oz (117.3 kg).  Physical Exam  General: The patient is alert and cooperative at the time of the examination. The patient is markedly obese.  Eyes: Pupils are equal, round, and reactive to light. Discs are flat bilaterally.  Neck: The neck is supple, no carotid bruits are noted.  Respiratory: The respiratory examination is clear.  Cardiovascular: The cardiovascular examination reveals a regular rate and rhythm, no obvious murmurs or rubs are noted.  Skin: Extremities are without significant edema.  Neurologic Exam  Mental status: The patient is alert and oriented x 3 at the time of the examination. The patient has apparent normal recent and remote memory, with an apparently normal attention span and concentration ability.  Cranial nerves: Facial symmetry is present. There is  good sensation of the face to pinprick and soft touch bilaterally. The strength of the facial muscles and the muscles to head turning and shoulder shrug are normal bilaterally. Speech is well enunciated, no aphasia or dysarthria is noted. Extraocular movements are full. Visual fields are full. The tongue is midline, and the patient has symmetric elevation of the soft palate. No obvious hearing deficits are noted. Mild masking of the face is seen.  Motor: The motor testing reveals 5 over 5 strength of all 4 extremities. Good symmetric motor tone is noted throughout.  Sensory: Sensory testing is intact to pinprick, soft touch, vibration sensation, and position sense on all 4 extremities. No evidence of extinction is noted.  Coordination: Cerebellar testing reveals good finger-nose-finger and heel-to-shin bilaterally. Resting tremor is noted with the left upper extremity.  Gait and station: Gait is normal. The patient is able to arise from a seated position with arms crossed. The patient has  slightly depressed but symmetric arm swing. Tandem gait is unsteady. Romberg is negative. No drift is seen.  Reflexes: Deep tendon reflexes are symmetric and normal bilaterally. Toes are downgoing bilaterally.   Assessment/Plan:  1. Parkinson's disease  The patient has good mobility at this time, I have indicated that he should get into a regular exercise program, however. The patient has difficulty sleeping at night, I have recommended taking 50 mg of Benadryl in the evening to suppress the tremor and to allow him to sleep. He will follow up in 6 months, sooner if needed. He is on Sinemet currently taking it 25/250 mg tablet taking 2 tablets 3 times daily.  Andre Alexanders MD 06/30/2016 11:10 AM  [facility] [address] [place], [place] [address]  Phone [phone] Fax [phone]

## 2016-07-03 DIAGNOSIS — G4733 Obstructive sleep apnea (adult) (pediatric): Secondary | ICD-10-CM | POA: Diagnosis not present

## 2016-07-04 DIAGNOSIS — G4733 Obstructive sleep apnea (adult) (pediatric): Secondary | ICD-10-CM | POA: Diagnosis not present

## 2016-07-11 ENCOUNTER — Telehealth: Payer: Self-pay | Admitting: Internal Medicine

## 2016-07-11 NOTE — Telephone Encounter (Signed)
Pt wife calling stating yesterday pt had a mini episode of SOB  It was out of the blue  Pt may not have wanted her to mention it but she would just like to have [place] aware of that He is fine today Please call back

## 2016-07-11 NOTE — Telephone Encounter (Signed)
Spoke with pt and he states he has SOB w/exertion and that he is having some leg pain when he walks a lot. Ask pt if he felt like he needed to be seen sooner for the SOB and he denied a sooner visit. Informed pt to contact PCP for the leg pain he is having to make sure nothing too severe is going on. Informed pt to call back if he needs [place] any sooner. Pt verbalized understanding. Nothing further needed.

## 2016-07-12 DIAGNOSIS — E039 Hypothyroidism, unspecified: Secondary | ICD-10-CM | POA: Diagnosis not present

## 2016-07-12 DIAGNOSIS — R0602 Shortness of breath: Secondary | ICD-10-CM | POA: Diagnosis not present

## 2016-07-25 ENCOUNTER — Telehealth: Payer: Self-pay | Admitting: Neurology

## 2016-07-25 NOTE — Telephone Encounter (Signed)
[name] is calling. The patient takes carbidopa-levodopa (PARCOPA) 25-250 MG per disintegrating tablet for tremors. Tremors are getting worse and wants to know if there is an additional medication he can take. Please call and discuss.

## 2016-07-25 NOTE — Telephone Encounter (Signed)
I called and left a message. The patient has been placed on Benadryl 50 mg at night or the tremor and for sleep, he may try half of a 25 mg tablet once or twice during the day for the same reason. I do not wish to place him on Artane or Cogentin as this will significantly adversely affect memory and concentration. The use of Sinemet alone usually does not completely suppress the tremor.

## 2016-08-03 DIAGNOSIS — R0602 Shortness of breath: Secondary | ICD-10-CM | POA: Diagnosis not present

## 2016-08-03 DIAGNOSIS — G4733 Obstructive sleep apnea (adult) (pediatric): Secondary | ICD-10-CM | POA: Diagnosis not present

## 2016-08-08 ENCOUNTER — Telehealth: Payer: Self-pay | Admitting: Internal Medicine

## 2016-08-08 NOTE — Telephone Encounter (Signed)
New message    Pt wife is calling because pt had a stress test with his PCP last week and she is calling to find out if the results were faxed over to Dr. Caryl Comes.

## 2016-08-08 NOTE — Telephone Encounter (Signed)
I called and spoke with the patient and his wife. He states that he had a test last Wednesday in Dr. Tonette Bihari office and wanted to know if I had the results of this. I advised him I do not see anything in Care Everywhere to even document what he had done- per the patient, it sounds as though he had a stress echo. I advised him that they should contact him with the report prior to sending this to [place]. He states Dr. Tonette Bihari office told him they would have to send the report "upstairs to cardiology" to review and he may not hear anything until today. I advised him that they must send their reports to [facility] cardiology, but Dr. Tonette Bihari office will call him with the results.  He voices understanding.

## 2016-08-10 ENCOUNTER — Telehealth: Payer: Self-pay | Admitting: Internal Medicine

## 2016-08-10 NOTE — Telephone Encounter (Signed)
New message       Calling to see if we received the stress test from Dr Tonette Bihari office.  They want Dr Caryl Comes to look at it.  Please call and let them know

## 2016-08-10 NOTE — Telephone Encounter (Signed)
PT  AWARE   DO NOT  SEE GXT  RESULTS  IN CARE  EVERYWHERE.PT   MAY CALL DR ANDERSON'S OFFICE  AND  SEE IF  THEY WILL FAX  COPY  TO  DR Olin Pia ATTENTION  PT  VERBALIZED UNDERSTANDING   PER PT   WAS TOLD  GXT  WAS  FINE   PT  CONT TO  C/O  SOB  .Adonis Housekeeper

## 2016-08-11 ENCOUNTER — Telehealth: Payer: Self-pay | Admitting: Internal Medicine

## 2016-08-11 NOTE — Telephone Encounter (Signed)
Pt spouse called back stating pt is going to try and come and see in tomorrow

## 2016-08-11 NOTE — Telephone Encounter (Signed)
Received referral call from Dr Tonette Bihari office stating pt is needing to be seen  for he is having sob and weak legs after excising. Called patient wife spoke to her about trying to see patient in the morning with NP Ignacia Bayley She states they are out of town and states he is still having the SOB but can't make it for that appointment and they will be back Friday night. She states patient only wants to see Dr Caryl Comes but needs to be seen soon  Dr Caryl Comes schedule is full at the moment and they are aware  Please advise where we can see patient here in clinic

## 2016-08-11 NOTE — Telephone Encounter (Signed)
Please tell the patient that in order for him to be seen in a timely manner, he will have to see a PA/ NP per Dr. Caryl Comes. I have no work in spots available at this time.

## 2016-08-12 ENCOUNTER — Ambulatory Visit: Payer: PPO | Admitting: Nurse Practitioner

## 2016-08-12 ENCOUNTER — Encounter: Payer: Self-pay | Admitting: Nurse Practitioner

## 2016-08-12 ENCOUNTER — Ambulatory Visit (INDEPENDENT_AMBULATORY_CARE_PROVIDER_SITE_OTHER): Payer: PPO | Admitting: Nurse Practitioner

## 2016-08-12 VITALS — BP 148/72 | HR 65 | Ht 67.0 in | Wt 261.5 lb

## 2016-08-12 DIAGNOSIS — I25119 Atherosclerotic heart disease of native coronary artery with unspecified angina pectoris: Secondary | ICD-10-CM

## 2016-08-12 DIAGNOSIS — I5033 Acute on chronic diastolic (congestive) heart failure: Secondary | ICD-10-CM | POA: Diagnosis not present

## 2016-08-12 DIAGNOSIS — I739 Peripheral vascular disease, unspecified: Secondary | ICD-10-CM | POA: Diagnosis not present

## 2016-08-12 DIAGNOSIS — I110 Hypertensive heart disease with heart failure: Secondary | ICD-10-CM

## 2016-08-12 NOTE — Progress Notes (Signed)
Office Visit    Patient Name: Andre [name] Date of Encounter: 08/12/2016  Primary Care Provider:  Kirk Ruths., MD Primary Cardiologist:  Olin Pia, MD   Chief Complaint    81 year old male with prior history of coronary artery disease, hypertension, hyperlipidemia, HFpEF, paroxysmal atrial fibrillation, and morbid obesity who presents for follow-up related to progressive dyspnea.  Past Medical History    Past Medical History:  Diagnosis Date  . Arthritis   . Chronic diastolic CHF (congestive heart failure) ([place])    a. 07/2016 Echo: >55%.  . Coronary artery disease    a. 1998 s/p mini-cabg @ [facility] - pt reports one vessel bypass to LAD;  b. 07/2016 St Echo:  Inadequate HR (max 97) w/ hypertensive response (220/96). Ex time only 2:54 - stopped due to dyspnea and leg pain.  . Depression   . GERD (gastroesophageal reflux disease)   . High cholesterol   . Hypertension   . PAF (paroxysmal atrial fibrillation) ([facility])    a. s/p DCCV-->maintaining sinus on amiodarone;  b. CHA2DS2VASc = 5-->eliquis.  . Parkinson's disease ([place])    tremors  . Pulmonary embolism ([place]) 2011  . 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 Sinus trouble   . Sleep apnea    wears CPAP   Past Surgical History:  Procedure Laterality Date  . BACK SURGERY  1960  . CHOLECYSTECTOMY    . CORONARY ARTERY BYPASS GRAFT    . ELECTROPHYSIOLOGIC STUDY N/A 07/14/2015   Procedure: CARDIOVERSION;  Surgeon: Yolonda Kida, MD;  Location: [facility] ORS;  Service: Cardiovascular;  Laterality: N/A;  . ELECTROPHYSIOLOGIC STUDY N/A 10/12/2015   Procedure: CARDIOVERSION;  Surgeon: Minna Merritts, MD;  Location: [facility] ORS;  Service: Cardiovascular;  Laterality: N/A;  . OTHER SURGICAL HISTORY  1998   Bypass    Allergies  Allergies  Allergen Reactions  . Pravastatin Other (See Comments)  . Prednisone Other (See Comments)    Pt states that med makes him hyper Pt states that med makes him hyper    History of Present  Illness    81 year old male with the above complex past medical history including CAD status post CABG 1 in 1998 at [facility], hypertensive heart disease, hyperlipidemia, morbid obesity, paroxysmal atrial fibrillation status post cardioversion in maintaining sinus rhythm on amiodarone. He also has a history of pulmonary embolism and Parkinson's. He is chronically anticoagulated in the setting of paroxysmal atrial fibrillation. Over the past year, he has been seen on several occasions in the setting of poor exercise tolerance. There was some question chronotropic incompetence resulting in a downward titration of his rate controlling medications. He is currently on amiodarone 200 mg daily and also verapamil 240 mg daily. He says that at times he has felt better and with more exercise tolerance when his weight was lower. His weight is up 4 pounds since [facility]. He is very frustrated by his dyspnea on exertion and recently underwent stress echocardiography at [facility] clinic.  He only walked for 2 minutes and 54 seconds and achieved a maximum heart rate of 97 bpm. He did have a hypertensive response to exercise with a maximum blood pressure of 220/96 in recovery. The study was stopped secondary to dyspnea and leg pain. The study was ultimately inadequate in the setting of an inability to achieve a target heart rate. Notably, despite symptoms of dyspnea, he did not have any wall motion abnormalities, LV function was normal, and there were no significant valvular  abnormalities.   He has not had any chest pain or palpitations. He denies PND, orthopnea, dizziness, syncope, or early satiety. He does have mild lower extremity edema. He also notes that when he walks his lower anterior legs become tight and painful. This is going on for some time. This improves with rest. He does not have any pain in his calves, hips, thighs, or buttocks.  Home Medications    Prior to Admission medications     Medication Sig Start Date End Date Taking? Authorizing Provider  albuterol (VENTOLIN HFA) 108 (90 BASE) MCG/ACT inhaler Inhale 2 puffs into the lungs every 4 (four) hours as needed for wheezing.   Yes Historical Provider, MD  amiodarone (PACERONE) 200 MG tablet Take 200 mg by mouth daily.  07/30/15  Yes Historical Provider, MD  apixaban (ELIQUIS) 5 MG TABS tablet Take 5 mg by mouth 2 (two) times daily. Reported on 07/31/2015   Yes Historical Provider, MD  budesonide (PULMICORT) 180 MCG/ACT inhaler Inhale 2 puffs into the lungs 2 (two) times daily as needed (shortness of breath). Reported on 07/31/2015   Yes Historical Provider, MD  busPIRone (BUSPAR) 7.5 MG tablet Take 7.5 mg by mouth 2 (two) times daily.   Yes Historical Provider, MD  carbidopa-levodopa (PARCOPA) 25-250 MG per disintegrating tablet Take 1 tablet by mouth 6 (six) times daily. Reported on 07/14/2015   Yes Historical Provider, MD  cyclobenzaprine (FLEXERIL) 5 MG tablet Take 5 mg by mouth daily.   Yes Historical Provider, MD  EPINEPHrine 0.3 mg/0.3 mL IJ SOAJ injection Inject 0.3 mg as directed once as needed (allergic reaction). Reported on 10/27/2015 07/01/13  Yes Historical Provider, MD  esomeprazole (NEXIUM) 20 MG capsule Take 40 mg by mouth daily at 12 noon.   Yes Historical Provider, MD  furosemide (LASIX) 40 MG tablet Take 1/2 tablet (20 mg) by mouth once every other day 01/12/16  Yes Deboraha Sprang, MD  levothyroxine (SYNTHROID, LEVOTHROID) 25 MCG tablet Take 25 mcg by mouth daily. 08/09/16  Yes Historical Provider, MD  mirabegron ER (MYRBETRIQ) 50 MG TB24 tablet Take 50 mg by mouth daily.   Yes Historical Provider, MD  oxyCODONE-acetaminophen (PERCOCET/ROXICET) 5-325 MG tablet 1 tablet by mouth evey eight hours as needed for pain 11/29/14  Yes Historical Provider, MD  verapamil (CALAN-SR) 240 MG CR tablet Take 240 mg by mouth daily.  10/30/13  Yes Historical Provider, MD    Review of Systems    Notable for acute on chronic dyspnea  on exertion and poor exercise tolerance. He denies chest pain, palpitations, PND, orthopnea, dizziness, syncope, or early satiety. He does have intermittent lower extremity swelling. He notes that his weight does vary based on his salt intake and he is not careful with salt.  All other systems reviewed and are otherwise negative except as noted above.  Physical Exam    VS:  BP (!) 148/72 (BP Location: Left Arm, Patient Position: Sitting, Cuff Size: Large)   Pulse 65   Ht 5\' 7"  (1.702 m)   Wt 261 lb 8 oz (118.6 kg)   BMI 40.96 kg/m  , BMI Body mass index is 40.96 kg/m. GEN: Morbidly obese, in no acute distress.  HEENT: normal.  Neck: Supple, obese, difficult to gauge JVP. No carotid bruits, or masses. Cardiac: RRR, distant, no murmurs, rubs, or gallops. No clubbing, cyanosis, 1+ bilateral ankle edema.  Radials/DP/PT 2+ and equal bilaterally.  Respiratory:  Respirations regular and unlabored, bibasilar crackles GI: Protuberant, semi-firm, nontender, BS + x  4. MS: no deformity or atrophy. Skin: warm and dry, no rash. Neuro:  Strength and sensation are intact. Psych: Normal affect.  Accessory Clinical Findings    ECG - Sinus rhythm, 65, first-degree AV block, baseline artifact related to tremor/Parkinson's. Left axis deviation. Lateral T-wave flattening. No acute changes.  Assessment & Plan    1.  Acute on chronic HFpEF: Patient has had recent worsening of dyspnea on exertion in the setting of weight gain. He does have volume overload on exam today. He is prescribed Lasix 20 g every other day however he has not taken this for at least a month. His symptoms have worsened over that month. I suspect that chronic deconditioning is likely playing a role in his baseline symptoms, however acute volume overload is likely responsible for acute worsening in the past month. I have recommended that he take Lasix 20 mg daily since he has previously responded to this well. His hesitation to taking the past  was that it worked so well and he urinated frequently throughout the day. I will plan to follow up with a basic metabolic panel and see him next Friday.  We discussed the importance of daily weights, sodium restriction, medication compliance, and symptom reporting and he verbalizes understanding. He does admit to eating out frequently and also salting his food regularly.  2. Coronary artery disease: Status post CABG 1 in 1998 at [facility]. He has not been having any chest pain. He recently underwent stress echocardiography however this was an inadequate study due to poor exercise tolerance. With progression of dyspnea on exertion over the past year, I think we need to rule out ischemia as a possible contributor. I will arrange for a Lexiscan Myoview to better evaluate in the setting of poor exercise tolerance.  3. Hypertensive heart disease with CHF: Blood pressure is elevated today. Resuming Lasix at 20 mg daily.  4. Paroxysmal atrial fibrillation: He is in sinus rhythm today and remains on amiodarone and his anticoagulant with eliquis. Heart rate stable at 65. Heart rate rose to 97 during his stress test. He did take his verapamil the evening prior to his stress test and amiodarone the morning prior to his stress test.  5. Morbid obesity: Certainly playing a role in his chronic exercise intolerance. He is trying to lose weight but inactivity next is difficult. I encouraged him to continue to watch his caloric intake and increase activity as tolerated.  6. Bilateral lower extremity pain with activity: Not clear if this may represent claudication in this gentleman with prior history of CAD and multiple other risk factors for peripheral arterial disease. Symptoms certainly limit his walking and he describes this as pain and tightness in his anterior lower legs that improves with rest. He has not had any pain in his buttocks, hips, thighs, or calves. I will arrange for arterial brachial indices to rule out  peripheral arterial disease.  7. Disposition: Follow-up stress testing and ABIs. Follow-up basic metabolic panel in 1 week and I will see him in the office in one week as well.   Murray Hodgkins, NP 08/12/2016, 3:53 PM

## 2016-08-12 NOTE — Patient Instructions (Addendum)
Medication Instructions:  Your physician has recommended you make the following change in your medication:  1. RESUME Furosemide 1/2 tablet once daily   Labwork: Next Friday before appointments go to [facility] of the hospital and check in at the front desk to have labs done. No appointment is needed and take lab slip with you.   Testing/Procedures: Your physician has requested that you have an ankle brachial index (ABI). During this test an ultrasound and blood pressure cuff are used to evaluate the arteries that supply the arms and legs with blood. Allow thirty minutes for this exam. There are no restrictions or special instructions.  [place]  Your caregiver has ordered a Stress Test with nuclear imaging. The purpose of this test is to evaluate the blood supply to your heart muscle. This procedure is referred to as a "Non-Invasive Stress Test." This is because other than having an IV started in your vein, nothing is inserted or "invades" your body. Cardiac stress tests are done to find areas of poor blood flow to the heart by determining the extent of coronary artery disease (CAD). Some patients exercise on a treadmill, which naturally increases the blood flow to your heart, while others who are  unable to walk on a treadmill due to physical limitations have a pharmacologic/chemical stress agent called Lexiscan . This medicine will mimic walking on a treadmill by temporarily increasing your coronary blood flow.   Please note: these test may take anywhere between 2-4 hours to complete  PLEASE REPORT TO [place] AT THE FIRST DESK WILL DIRECT YOU WHERE TO GO  Date of Procedure:_Thursday May 3, 2018___  Arrival Time for Procedure:__Arrive at 09:15 AM_______    PLEASE NOTIFY THE OFFICE AT LEAST 24 HOURS IN ADVANCE IF YOU ARE UNABLE TO [place].  [phone] AND  PLEASE NOTIFY NUCLEAR MEDICINE AT [facility] AT LEAST 24 HOURS IN ADVANCE  IF YOU ARE UNABLE TO KEEP YOUR APPOINTMENT. [phone]  How to prepare for your Myoview test:  1. Do not eat or drink after midnight 2. No caffeine for 24 hours prior to test 3. No smoking 24 hours prior to test. 4. Your medication may be taken with water.  If your doctor stopped a medication because of this test, do not take that medication. 5. Ladies, please do not wear dresses.  Skirts or pants are appropriate. Please wear a short sleeve shirt. 6. No perfume, cologne or lotion. 7. Wear comfortable walking shoes. No heels!    Follow-Up: Your physician recommends that you schedule a follow-up appointment next Friday Aug 19, 2016 with Ignacia Bayley NP  It was a pleasure seeing you today here in the office. Please do not hesitate to give [place] a call back if you have any further questions. [place], BSN    Ankle-Brachial Index Test The ankle-brachial index (ABI) test is used to find peripheral vascular disease (PVD). PVD is also known as peripheral arterial disease (PAD). PVD is the blocking or hardening of the arteries anywhere within the circulatory system beyond the heart. PVD is caused by cholesterol deposits in your blood vessels (atherosclerosis). These deposits cause arteries to narrow. The delivery of oxygen to your tissues is impaired as a result. This can cause muscle pain and fatigue. This is called claudication. PVD means there may also be buildup of cholesterol in your:  Heart. This increases the risk of heart attacks.  Brain. This increases the risk of  strokes. The ankle-brachial index test measures the blood flow in your arms and legs. This test also determines if blood vessels in your leg are narrowed by cholesterol deposits. There are additional causes of a reduced ankle-brachial index, such as inflammation of vessels or a clot in the vessels. However, these are much less common than narrowing due to cholesterol deposits. What is being tested? The test is  done while you are lying down and resting. Measurements are taken of the systolic pressure:  In your arm (brachial).  In your ankle at several points along your leg. Systolic pressure is the pressure inside your arteries when your heart pumps. The measurements are taken several times on both sides. Then, the highest systolic pressure of the ankle is divided by the highest brachial systolic pressure. The result is the ankle-brachial pressure ratio, or ABI. Sometimes this test is repeated after you have exercised on a treadmill for five minutes. You may have leg pain during the exercise portion of the test if you suffer from PAD. If the index number drops after exercise, this may show that PAD is present. A normal ABI ratio is between 0.9 and 1.4. A value below 0.9 is considered abnormal. This information is not intended to replace advice given to you by your health care provider. Make sure you discuss any questions you have with your health care provider. Document Released: 04/08/2004 Document Revised: 09/10/2015 Document Reviewed: 11/08/2013 Elsevier Interactive Patient Education  2017 [place]. Pharmacologic Stress Electrocardiogram Introduction A pharmacologic stress electrocardiogram is a heart (cardiac) test that uses nuclear imaging to evaluate the blood supply to your heart. This test may also be called a pharmacologic stress electrocardiography. Pharmacologic means that a medicine is used to increase your heart rate and blood pressure. This stress test is done to find areas of poor blood flow to the heart by determining the extent of coronary artery disease (CAD). Some people exercise on a treadmill, which naturally increases the blood flow to the heart. For those people unable to exercise on a treadmill, a medicine is used. This medicine stimulates your heart and will cause your heart to beat harder and more quickly, as if you were exercising. Pharmacologic stress tests can help  determine:  The adequacy of blood flow to your heart during increased levels of activity in order to clear you for discharge home.  The extent of coronary artery blockage caused by CAD.  Your prognosis if you have suffered a heart attack.  The effectiveness of cardiac procedures done, such as an angioplasty, which can increase the circulation in your coronary arteries.  Causes of chest pain or pressure. LET [facility] CARE PROVIDER KNOW ABOUT:  Any allergies you have.  All medicines you are taking, including vitamins, herbs, eye drops, creams, and over-the-counter medicines.  Previous problems you or members of your family have had with the use of anesthetics.  Any blood disorders you have.  Previous surgeries you have had.  Medical conditions you have.  Possibility of pregnancy, if this applies.  If you are currently breastfeeding. RISKS AND COMPLICATIONS Generally, this is a safe procedure. However, as with any procedure, complications can occur. Possible complications include:  You develop pain or pressure in the following areas:  Chest.  Jaw or neck.  Between your shoulder blades.  Radiating down your left arm.  Headache.  Dizziness or light-headedness.  Shortness of breath.  Increased or irregular heartbeat.  Low blood pressure.  Nausea or vomiting.  Flushing.  Redness going  up the arm and slight pain during injection of medicine.  Heart attack (rare). BEFORE THE PROCEDURE  Avoid all forms of caffeine for 24 hours before your test or as directed by your health care provider. This includes coffee, tea (even decaffeinated tea), caffeinated sodas, chocolate, cocoa, and certain pain medicines.  Follow your health care provider's instructions regarding eating and drinking before the test.  Take your medicines as directed at regular times with water unless instructed otherwise. Exceptions may include:  If you have diabetes, ask how you are to take  your insulin or pills. It is common to adjust insulin dosing the morning of the test.  If you are taking beta-blocker medicines, it is important to talk to your health care provider about these medicines well before the date of your test. Taking beta-blocker medicines may interfere with the test. In some cases, these medicines need to be changed or stopped 24 hours or more before the test.  If you wear a nitroglycerin patch, it may need to be removed prior to the test. Ask your health care provider if the patch should be removed before the test.  If you use an inhaler for any breathing condition, bring it with you to the test.  If you are an outpatient, bring a snack so you can eat right after the stress phase of the test.  Do not smoke for 4 hours prior to the test or as directed by your health care provider.  Do not apply lotions, powders, creams, or oils on your chest prior to the test.  Wear comfortable shoes and clothing. Let your health care provider know if you were unable to complete or follow the preparations for your test. PROCEDURE  Multiple patches (electrodes) will be put on your chest. If needed, small areas of your chest may be shaved to get better contact with the electrodes. Once the electrodes are attached to your body, multiple wires will be attached to the electrodes, and your heart rate will be monitored.  An IV access will be started. A nuclear trace (isotope) is given. The isotope may be given intravenously, or it may be swallowed. Nuclear refers to several types of radioactive isotopes, and the nuclear isotope lights up the arteries so that the nuclear images are clear. The isotope is absorbed by your body. This results in low radiation exposure.  A resting nuclear image is taken to show how your heart functions at rest.  A medicine is given through the IV access.  A second scan is done about 1 hour after the medicine injection and determines how your heart functions  under stress.  During this stress phase, you will be connected to an electrocardiogram machine. Your blood pressure and oxygen levels will be monitored. What to expect after the procedure  Your heart rate and blood pressure will be monitored after the test.  You may return to your normal schedule, including diet,activities, and medicines, unless your health care provider tells you otherwise. This information is not intended to replace advice given to you by your health care provider. Make sure you discuss any questions you have with your health care provider. Document Released: 08/21/2008 Document Revised: 09/10/2015 Document Reviewed: 10/12/2015 Elsevier Interactive Patient Education  2017 [employer].

## 2016-08-18 ENCOUNTER — Encounter
Admission: RE | Admit: 2016-08-18 | Discharge: 2016-08-18 | Disposition: A | Discharge status: Home / Self Care | Payer: PPO | Source: Ambulatory Visit | Attending: Nurse Practitioner | Admitting: Nurse Practitioner

## 2016-08-18 DIAGNOSIS — I5033 Acute on chronic diastolic (congestive) heart failure: Secondary | ICD-10-CM

## 2016-08-18 DIAGNOSIS — G4733 Obstructive sleep apnea (adult) (pediatric): Secondary | ICD-10-CM | POA: Diagnosis not present

## 2016-08-18 LAB — NM MYOCAR MULTI W/SPECT W/WALL MOTION / EF
LV dias vol: 105 mL (ref 62–150)
LV sys vol: 41 mL
Peak HR: 70 {beats}/min
Percent HR: 49 %
Rest HR: 62 {beats}/min
SDS: 4
SRS: 1
SSS: 4
TID: 0.87

## 2016-08-18 MED ORDER — TECHNETIUM TC 99M TETROFOSMIN IV KIT
13.0000 | PACK | Freq: Once | INTRAVENOUS | Status: AC | PRN
  Administered 2016-08-18: 12.97 via INTRAVENOUS

## 2016-08-18 MED ORDER — REGADENOSON 0.4 MG/5ML IV SOLN
0.4000 mg | Freq: Once | INTRAVENOUS | Status: AC
  Administered 2016-08-18: 0.4 mg via INTRAVENOUS

## 2016-08-18 MED ORDER — TECHNETIUM TC 99M TETROFOSMIN IV KIT
30.0000 | PACK | Freq: Once | INTRAVENOUS | Status: AC | PRN
  Administered 2016-08-18: 29.088 via INTRAVENOUS

## 2016-08-19 ENCOUNTER — Ambulatory Visit (INDEPENDENT_AMBULATORY_CARE_PROVIDER_SITE_OTHER): Payer: PPO | Admitting: Nurse Practitioner

## 2016-08-19 ENCOUNTER — Other Ambulatory Visit
Admission: RE | Admit: 2016-08-19 | Discharge: 2016-08-19 | Disposition: A | Discharge status: Home / Self Care | Payer: PPO | Source: Ambulatory Visit | Attending: Nurse Practitioner | Admitting: Nurse Practitioner

## 2016-08-19 ENCOUNTER — Encounter: Payer: Self-pay | Admitting: Internal Medicine

## 2016-08-19 ENCOUNTER — Encounter: Payer: Self-pay | Admitting: Nurse Practitioner

## 2016-08-19 ENCOUNTER — Ambulatory Visit (INDEPENDENT_AMBULATORY_CARE_PROVIDER_SITE_OTHER): Payer: PPO | Admitting: Internal Medicine

## 2016-08-19 VITALS — BP 128/80 | HR 67 | Resp 16 | Ht 68.0 in | Wt 257.0 lb

## 2016-08-19 VITALS — BP 160/70 | HR 65 | Ht 67.0 in | Wt 258.0 lb

## 2016-08-19 DIAGNOSIS — I480 Paroxysmal atrial fibrillation: Secondary | ICD-10-CM

## 2016-08-19 DIAGNOSIS — I5030 Unspecified diastolic (congestive) heart failure: Secondary | ICD-10-CM

## 2016-08-19 DIAGNOSIS — I5033 Acute on chronic diastolic (congestive) heart failure: Secondary | ICD-10-CM | POA: Diagnosis not present

## 2016-08-19 DIAGNOSIS — I2510 Atherosclerotic heart disease of native coronary artery without angina pectoris: Secondary | ICD-10-CM

## 2016-08-19 DIAGNOSIS — I110 Hypertensive heart disease with heart failure: Secondary | ICD-10-CM | POA: Diagnosis not present

## 2016-08-19 DIAGNOSIS — R0602 Shortness of breath: Secondary | ICD-10-CM

## 2016-08-19 LAB — BASIC METABOLIC PANEL
Anion gap: 10 (ref 5–15)
BUN: 20 mg/dL (ref 6–20)
CO2: 25 mmol/L (ref 22–32)
Calcium: 9.3 mg/dL (ref 8.9–10.3)
Chloride: 101 mmol/L (ref 101–111)
Creatinine, Ser: 1.1 mg/dL (ref 0.61–1.24)
GFR calc Af Amer: >60 mL/min (ref >60)
GFR calc non Af Amer: >60 mL/min (ref >60)
Glucose, Bld: 123 mg/dL — ABNORMAL HIGH (ref 65–99)
Potassium: 4.1 mmol/L (ref 3.5–5.1)
Sodium: 136 mmol/L (ref 135–145)

## 2016-08-19 NOTE — Patient Instructions (Signed)
Medication Instructions:  Your physician has recommended you make the following change in your medication:  1. Take 1 whole tablet furosemide for 5 days then go back to previous dose of half tablet.  Follow-Up: Your physician recommends that you schedule a follow-up appointment in: 1 month with Ignacia Bayley NP  It was a pleasure seeing you today here in the office. Please do not hesitate to give [place] a call back if you have any further questions. [place], BSN

## 2016-08-19 NOTE — Progress Notes (Signed)
  [facility] Consultation      MRN# [id number] BENJI POYNTER 06/03/1935   CC: Chief Complaint  Patient presents with  . Follow-up  . Sleep Apnea      Brief History: 81 yo M former smoker, history of CAD/CABG/hypertension/allergies on immunotherapy/OSA on CPAP/prior PE, initially seen in consultation for dyspnea, PFT with mixed restriction and obstruction, currently following with pulmonary for OSA optimization.   Events since last clinic visit: Presents today for follow-up of his sleep study. He was diagnosed with OSA in 2010, AHI= 52. Compliance report shows AHI 0.7, 100% compliance CPAP 12 cm h20   Overall he states that he's lost about 14 pounds since his last visit, Has increased SOba dn lower ext swelling Sees cardiology stress test pending   He is accompanied today by his wife. He also stated that he and his wife are going to be doing a modified [employer] to continued weight loss.  No signs of infection at this time     Review of Systems  Constitutional: Negative for malaise/fatigue.  Eyes: Negative for blurred vision.  Respiratory: Positive for cough. Negative for hemoptysis, sputum production, shortness of breath and wheezing.   Cardiovascular: Negative for chest pain.  Genitourinary: Negative for dysuria.  Musculoskeletal: Negative for myalgias.  Neurological: Negative for dizziness, weakness and headaches.  Endo/Heme/Allergies: Does not bruise/bleed easily.  Psychiatric/Behavioral: Negative for depression.      Allergies:  Pravastatin and Prednisone  Physical Examination:  BP 128/80 (BP Location: Left Arm, Patient Position: Sitting, Cuff Size: Normal)   Pulse 67   Resp 16   Ht 5\' 8"  (1.727 m)   Wt 257 lb (116.6 kg)   SpO2 96%   BMI 39.08 kg/m   General Appearance: No distress  HEENT: PERRLA, no ptosis, no other lesions noticed Pulmonary:normal breath sounds., diaphragmatic excursion normal.No wheezing,  +rales/crackles Cardiovascular:  Normal S1,S2.  No m/r/g.     Abdomen:Exam: Benign, Soft, non-tender, No masses  Skin:   warm, no rashes, no ecchymosis  Extremities: +edema     Assessment and Plan: 81 year old male past medical history of obesity, obstructive sleep apnea, seen in follow-up visit for OSA optimization. Now with SOB likely from CHF  SOB Check PFT and 6MWT  -follow up cardiology  OSA (obstructive sleep apnea) Patient with prior sleep study with diagnosis of severe sleep apnea. Doing well today with AHI=0.7 on 12cm H2O of CPAP, with 100% compliance.    Plan: -CPAP 12cm H2O with full face mask  Follow up in 3 months Patient/Family are satisfied with Plan of action and management. All questions answered  Corrin Parker, M.D.  [facility]  Medical Director Parker Director [facility] Cardio-Pulmonary Department

## 2016-08-19 NOTE — Patient Instructions (Signed)
Check 6MWT and PFT Follow up in 3 months

## 2016-08-19 NOTE — Progress Notes (Signed)
Office Visit    Patient Name: Andre [name] Date of Encounter: 08/19/2016  Primary Care Provider:  Kirk Ruths., MD Primary Cardiologist:  Olin Pia, MD   Chief Complaint    81 year old male with prior history of coronary artery disease, hypertension, hyperlipidemia, HFpEF, paroxysmal atrial fibrillation, and morbid obesity who presents for follow-up related to dyspnea.  Past Medical History    Past Medical History:  Diagnosis Date  . Cervical spondylosis 10/01/2013  . Chronic diastolic CHF (congestive heart failure) ([place])    a. 07/2016 Echo: >55%.  . Coronary artery disease    a. 1998 s/p mini-cabg @ [facility] - pt reports one vessel bypass to LAD;  b. 07/2016 St Echo:  Inadequate HR (max 97) w/ hypertensive response (220/96). Ex time only 2:54 - stopped due to dyspnea and leg pain;  c.  08/2016 MV: EF 67%, no ischemia, low risk.  . Depression   . GERD (gastroesophageal reflux disease)   . High cholesterol   . Hypertension   . PAF (paroxysmal atrial fibrillation) ([facility])    a. s/p DCCV-->maintaining sinus on amiodarone;  b. CHA2DS2VASc = 5-->eliquis.  . Parkinson's disease ([place])    tremors  . Pulmonary embolism ([place]) 2011  . 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 Sinus trouble   . Sleep apnea    wears CPAP   Past Surgical History:  Procedure Laterality Date  . BACK SURGERY  1960  . CHOLECYSTECTOMY    . CORONARY ARTERY BYPASS GRAFT    . ELECTROPHYSIOLOGIC STUDY N/A 07/14/2015   Procedure: CARDIOVERSION;  Surgeon: Yolonda Kida, MD;  Location: [facility] ORS;  Service: Cardiovascular;  Laterality: N/A;  . ELECTROPHYSIOLOGIC STUDY N/A 10/12/2015   Procedure: CARDIOVERSION;  Surgeon: Minna Merritts, MD;  Location: [facility] ORS;  Service: Cardiovascular;  Laterality: N/A;  . OTHER SURGICAL HISTORY  1998   Bypass    Allergies  Allergies  Allergen Reactions  . Pravastatin Other (See Comments)  . Prednisone Other (See Comments)    Pt states that med makes him hyper Pt states  that med makes him hyper    History of Present Illness    81 year old male with the above complex past medical history including CAD status post CABG 1 in 1998 at [facility], hypertensive heart disease, hyperlipidemia, morbid obesity, paroxysmal atrial fibrillation status post cardioversion in maintaining sinus rhythm on amiodarone. He also has a history of pulmonary embolism and Parkinson's. He is chronically anticoagulated in the setting of paroxysmal atrial fibrillation.   I saw him in clinic a week ago in the setting of a one-year history of progressive dyspnea on exertion.   This occurred in the setting of noncompliance with oral Lasix. He also had recently undergone  Stress echocardiography through [facility], which was inconclusive in the setting of an inadequate heart rate.  He was volume up on the day that I saw him and I advised that he begin taking lasix 20 mg daily as prev Rx.  I also arranged for a lexi MV to r/o ischemia since st echo was inadequate.  St echo was performed yesterday and was low risk.  Since his last visit, he is down 4 lbs on our scale - he says more on his home scale.  He saw pulmonology this am and is sched for pft's and 6 min walk.  He says that his ex tol has improved some, as he has had to walk a fair bit this am around the [facility] and he feels that  he's done pretty well - something he would not have been able to do a week ago.  He and his wife have been watching his salt more closely and have been cooking @ home more often.  Home Medications    Prior to Admission medications   Medication Sig Start Date End Date Taking? Authorizing Provider  albuterol (VENTOLIN HFA) 108 (90 BASE) MCG/ACT inhaler Inhale 2 puffs into the lungs every 4 (four) hours as needed for wheezing.   Yes Historical Provider, MD  amiodarone (PACERONE) 200 MG tablet Take 200 mg by mouth daily.  07/30/15  Yes Historical Provider, MD  apixaban (ELIQUIS) 5 MG TABS tablet Take 5 mg by mouth 2 (two)  times daily. Reported on 07/31/2015   Yes Historical Provider, MD  budesonide (PULMICORT) 180 MCG/ACT inhaler Inhale 2 puffs into the lungs 2 (two) times daily as needed (shortness of breath). Reported on 07/31/2015   Yes Historical Provider, MD  busPIRone (BUSPAR) 7.5 MG tablet Take 7.5 mg by mouth 2 (two) times daily.   Yes Historical Provider, MD  carbidopa-levodopa (PARCOPA) 25-250 MG per disintegrating tablet Take 1 tablet by mouth 6 (six) times daily. Reported on 07/14/2015   Yes Historical Provider, MD  cyclobenzaprine (FLEXERIL) 5 MG tablet Take 5 mg by mouth daily.   Yes Historical Provider, MD  EPINEPHrine 0.3 mg/0.3 mL IJ SOAJ injection Inject 0.3 mg as directed once as needed (allergic reaction). Reported on 10/27/2015 07/01/13  Yes Historical Provider, MD  esomeprazole (NEXIUM) 20 MG capsule Take 40 mg by mouth daily at 12 noon.   Yes Historical Provider, MD  furosemide (LASIX) 40 MG tablet Take 1/2 tablet (20 mg) by mouth once every other day 01/12/16  Yes Deboraha Sprang, MD  levothyroxine (SYNTHROID, LEVOTHROID) 25 MCG tablet Take 25 mcg by mouth daily. 08/09/16  Yes Historical Provider, MD  mirabegron ER (MYRBETRIQ) 50 MG TB24 tablet Take 50 mg by mouth daily.   Yes Historical Provider, MD  oxyCODONE-acetaminophen (PERCOCET/ROXICET) 5-325 MG tablet 1 tablet by mouth evey eight hours as needed for pain 11/29/14  Yes Historical Provider, MD  verapamil (CALAN-SR) 240 MG CR tablet Take 240 mg by mouth daily.  10/30/13  Yes Historical Provider, MD    Review of Systems    Still with some DOE and lower ext edema, but overall slightly better.  He denies chest pain, palpitations, pnd, orthopnea, n, v, dizziness, syncope, weight gain, or early satiety. .  All other systems reviewed and are otherwise negative except as noted above.  Physical Exam    VS:  BP (!) 160/70 (BP Location: Left Arm, Patient Position: Sitting, Cuff Size: Normal)   Pulse 65   Ht 5\' 7"  (1.702 m)   Wt 258 lb (117 kg)   SpO2  97%   BMI 40.41 kg/m  , BMI Body mass index is 40.41 kg/m. GEN: obese, no acute distress.  HEENT: normal.  Neck: Supple, obese, difficult to gauge jvp, no carotid bruits, or masses. Cardiac: RRR, no murmurs, rubs, or gallops. No clubbing, cyanosis, trace bilat ankle edema.  Radials/DP/PT 2+ and equal bilaterally.  Respiratory:  Respirations regular and unlabored, clear to auscultation bilaterally. GI: Soft, nontender, nondistended, BS + x 4. MS: no deformity or atrophy. Skin: warm and dry, no rash. Neuro:  Strength and sensation are intact. Psych: Normal affect.  Accessory Clinical Findings    Lexiscan Cardiolite 5.3.2018  Pharmacological myocardial perfusion imaging study with no significant  ischemia Normal wall motion, EF estimated at 67%  No EKG changes concerning for ischemia at peak stress or in recovery. Low risk scan  Lab Results  Component Value Date   CREATININE 1.10 08/19/2016   BUN 20 08/19/2016   NA 136 08/19/2016   K 4.1 08/19/2016   CL 101 08/19/2016   CO2 25 08/19/2016     Assessment & Plan    1.  Acute on chronic HFpEF:  Pt has a one year h/o progressive dyspnea and poor ex tolerance.  When I saw him a week ago, he was volume overloaded and his wt was up.  He was not taking lasix as Rx.  Over the past week, he has been taking lasix 20 daily.  Wt is down 4 lbs. Renal fxn stable this am.  He is feeling better with improved activity tolerance.  He still have some evidence of volume overload.  I have asked him to increase lasix to 40 mg daily for the next 5 days and then drop back down to 20 daily.  We again discussed the importance of daily weights, sodium restriction, medication compliance, and symptom reporting and he verbalizes understanding and has been making more of an effort to limit salt in his food - cooking more @ home.  He will also undergo PFT's and 6 min walk per pulm.  I will plan to see him back in a month.  2.  CAD: s/p CABG x 1 @ [facility] in 1998.  He  just underwent Lexiscan Myoview stress testing yesterday, which was low risk and nonischemic. I am reassured by this and in that setting, I suspect that his dyspnea is most likely related to volume issues.  3. Hypertensive heart disease with CHF: Blood pressure is elevated today. He has not yet taken his Lasix this morning. He did take his verapamil last night. Blood pressure is 128/80 when he saw pulmonology just prior to our visit. Other than increasing Lasix for the next 5 days, I will not make any long-standing changes.  4. Paroxysmal atrial fibrillation: He remains in sinus rhythm on amiodarone and is anticoagulated with eliquis.  5. Morbid obesity: This is certainly playing a role in his chronic exercise intolerance and diastolic failure. He is watching more closely what he is taking in and his hopes to become more active so that he may begin losing weight.  6. Bilateral lower extremity pain with activity: He is scheduled for ABIs later this month. He says he walk today without any leg pain. Question role of lower extremity edema in his pain.  7. Disposition: I will see him back in one month.   Murray Hodgkins, NP 08/19/2016, 12:34 PM

## 2016-08-23 ENCOUNTER — Inpatient Hospital Stay: Payer: PPO | Attending: Hematology and Oncology

## 2016-08-23 ENCOUNTER — Inpatient Hospital Stay: Payer: PPO

## 2016-08-23 DIAGNOSIS — M1990 Unspecified osteoarthritis, unspecified site: Secondary | ICD-10-CM | POA: Diagnosis not present

## 2016-08-23 DIAGNOSIS — D696 Thrombocytopenia, unspecified: Secondary | ICD-10-CM | POA: Diagnosis not present

## 2016-08-23 DIAGNOSIS — D751 Secondary polycythemia: Secondary | ICD-10-CM | POA: Insufficient documentation

## 2016-08-23 DIAGNOSIS — Z87891 Personal history of nicotine dependence: Secondary | ICD-10-CM | POA: Insufficient documentation

## 2016-08-23 DIAGNOSIS — Z79899 Other long term (current) drug therapy: Secondary | ICD-10-CM | POA: Diagnosis not present

## 2016-08-23 DIAGNOSIS — G20 Parkinson's disease: Secondary | ICD-10-CM | POA: Insufficient documentation

## 2016-08-23 DIAGNOSIS — F329 Major depressive disorder, single episode, unspecified: Secondary | ICD-10-CM | POA: Diagnosis not present

## 2016-08-23 DIAGNOSIS — I10 Essential (primary) hypertension: Secondary | ICD-10-CM | POA: Diagnosis not present

## 2016-08-23 DIAGNOSIS — G4730 Sleep apnea, unspecified: Secondary | ICD-10-CM | POA: Diagnosis not present

## 2016-08-23 DIAGNOSIS — K219 Gastro-esophageal reflux disease without esophagitis: Secondary | ICD-10-CM | POA: Insufficient documentation

## 2016-08-23 DIAGNOSIS — Z951 Presence of aortocoronary bypass graft: Secondary | ICD-10-CM | POA: Insufficient documentation

## 2016-08-23 DIAGNOSIS — Z7901 Long term (current) use of anticoagulants: Secondary | ICD-10-CM | POA: Insufficient documentation

## 2016-08-23 DIAGNOSIS — I2510 Atherosclerotic heart disease of native coronary artery without angina pectoris: Secondary | ICD-10-CM | POA: Diagnosis not present

## 2016-08-23 DIAGNOSIS — I4891 Unspecified atrial fibrillation: Secondary | ICD-10-CM | POA: Diagnosis not present

## 2016-08-23 DIAGNOSIS — Z86711 Personal history of pulmonary embolism: Secondary | ICD-10-CM | POA: Insufficient documentation

## 2016-08-23 DIAGNOSIS — E7800 Pure hypercholesterolemia, unspecified: Secondary | ICD-10-CM | POA: Insufficient documentation

## 2016-08-23 LAB — CBC WITH DIFFERENTIAL/PLATELET
Basophils Absolute: 0.1 10*3/uL (ref 0.0–0.1)
Basophils Relative: 1 %
Eosinophils Absolute: 0.1 10*3/uL (ref 0.0–0.7)
Eosinophils Relative: 1 %
HCT: 48.5 % (ref 39.0–52.0)
Hemoglobin: 16.3 g/dL (ref 13.0–17.0)
Lymphocytes Relative: 23 %
Lymphs Abs: 2.2 10*3/uL (ref 0.7–4.0)
MCH: 29.9 pg (ref 26.0–34.0)
MCHC: 33.6 g/dL (ref 30.0–36.0)
MCV: 89 fL (ref 78.0–100.0)
Monocytes Absolute: 1 10*3/uL (ref 0.1–1.0)
Monocytes Relative: 10 %
Neutro Abs: 6.1 10*3/uL (ref 1.7–7.7)
Neutrophils Relative %: 65 %
Platelets: 165 10*3/uL (ref 150–400)
RBC: 5.45 MIL/uL (ref 4.22–5.81)
RDW: 15.3 % (ref 11.5–15.5)
WBC: 9.5 10*3/uL (ref 4.0–10.5)

## 2016-08-24 ENCOUNTER — Telehealth: Payer: Self-pay | Admitting: Internal Medicine

## 2016-08-24 NOTE — Telephone Encounter (Signed)
Pt unable to do PFT tomorrow. Please reschedule. Thanks.

## 2016-08-24 NOTE — Telephone Encounter (Signed)
Pt wife calling needing to reschedule patients PFT that is scheduled for tmrw  They are having their home pressure washed  Please call back

## 2016-08-24 NOTE — Telephone Encounter (Signed)
Pt's wife returned call and stated that patient would just keep appointment tomorrow at 2:00. Nothing else needed at this time. Rhonda J Cobb

## 2016-08-24 NOTE — Telephone Encounter (Signed)
Called to R/S SMW, I do not see where patient has a PFT scheduled.  No answer.  LMOAM for pt to return my call to R/S SMW. Rhonda J Cobb

## 2016-08-25 ENCOUNTER — Ambulatory Visit: Payer: PPO

## 2016-08-30 DIAGNOSIS — K2270 Barrett's esophagus without dysplasia: Secondary | ICD-10-CM | POA: Diagnosis not present

## 2016-08-30 DIAGNOSIS — K760 Fatty (change of) liver, not elsewhere classified: Secondary | ICD-10-CM | POA: Diagnosis not present

## 2016-08-30 DIAGNOSIS — Z86010 Personal history of colonic polyps: Secondary | ICD-10-CM | POA: Diagnosis not present

## 2016-08-31 ENCOUNTER — Ambulatory Visit (INDEPENDENT_AMBULATORY_CARE_PROVIDER_SITE_OTHER): Payer: PPO | Admitting: *Deleted

## 2016-08-31 DIAGNOSIS — E119 Type 2 diabetes mellitus without complications: Secondary | ICD-10-CM | POA: Diagnosis not present

## 2016-08-31 DIAGNOSIS — R0600 Dyspnea, unspecified: Secondary | ICD-10-CM

## 2016-08-31 DIAGNOSIS — I482 Chronic atrial fibrillation: Secondary | ICD-10-CM | POA: Diagnosis not present

## 2016-08-31 DIAGNOSIS — Z6841 Body Mass Index (BMI) 40.0 and over, adult: Secondary | ICD-10-CM | POA: Diagnosis not present

## 2016-08-31 DIAGNOSIS — I2510 Atherosclerotic heart disease of native coronary artery without angina pectoris: Secondary | ICD-10-CM | POA: Diagnosis not present

## 2016-08-31 DIAGNOSIS — R946 Abnormal results of thyroid function studies: Secondary | ICD-10-CM | POA: Diagnosis not present

## 2016-08-31 NOTE — Patient Instructions (Signed)
Results of SMW will be discussed next office visit.

## 2016-08-31 NOTE — Progress Notes (Signed)
SIX MIN WALK 08/31/2016 09/04/2012  Medications Carbidopa-levodopa x2 -  Supplimental Oxygen during Test? (L/min) No No  Laps 7 -  Partial Lap (in Meters) 0 -  Baseline BP (sitting) 170/80 -  Baseline Heartrate 77 -  Baseline Dyspnea (Borg Scale) 0 -  Baseline Fatigue (Borg Scale) 3 -  Baseline SPO2 98 -  BP (sitting) 180/80 -  Heartrate 79 -  Dyspnea (Borg Scale) 1 -  Fatigue (Borg Scale) 2 -  SPO2 93 -  BP (sitting) 180/79 -  Heartrate 72 -  SPO2 96 -  Stopped or Paused before Six Minutes No -  Distance Completed 336 -  Tech Comments: Patient walked at brisk pace with no complaints of sob,chest tightness or wheeze. Patient was able to hold conversation entire walk. -   SMW performed today.

## 2016-09-02 DIAGNOSIS — G4733 Obstructive sleep apnea (adult) (pediatric): Secondary | ICD-10-CM | POA: Diagnosis not present

## 2016-09-07 DIAGNOSIS — E032 Hypothyroidism due to medicaments and other exogenous substances: Secondary | ICD-10-CM | POA: Diagnosis not present

## 2016-09-07 DIAGNOSIS — E119 Type 2 diabetes mellitus without complications: Secondary | ICD-10-CM | POA: Diagnosis not present

## 2016-09-07 DIAGNOSIS — G4733 Obstructive sleep apnea (adult) (pediatric): Secondary | ICD-10-CM | POA: Diagnosis not present

## 2016-09-07 DIAGNOSIS — I482 Chronic atrial fibrillation: Secondary | ICD-10-CM | POA: Diagnosis not present

## 2016-09-07 DIAGNOSIS — Z9989 Dependence on other enabling machines and devices: Secondary | ICD-10-CM | POA: Diagnosis not present

## 2016-09-07 DIAGNOSIS — G20 Parkinson's disease: Secondary | ICD-10-CM | POA: Diagnosis not present

## 2016-09-07 DIAGNOSIS — I700 Atherosclerosis of aorta: Secondary | ICD-10-CM | POA: Diagnosis not present

## 2016-09-07 DIAGNOSIS — I10 Essential (primary) hypertension: Secondary | ICD-10-CM | POA: Diagnosis not present

## 2016-09-07 DIAGNOSIS — D696 Thrombocytopenia, unspecified: Secondary | ICD-10-CM | POA: Diagnosis not present

## 2016-09-07 DIAGNOSIS — Z6841 Body Mass Index (BMI) 40.0 and over, adult: Secondary | ICD-10-CM | POA: Diagnosis not present

## 2016-09-07 DIAGNOSIS — E784 Other hyperlipidemia: Secondary | ICD-10-CM | POA: Diagnosis not present

## 2016-09-08 ENCOUNTER — Other Ambulatory Visit: Payer: Self-pay | Admitting: Nurse Practitioner

## 2016-09-08 ENCOUNTER — Encounter: Payer: Self-pay | Admitting: Internal Medicine

## 2016-09-08 DIAGNOSIS — I739 Peripheral vascular disease, unspecified: Secondary | ICD-10-CM

## 2016-09-08 NOTE — Progress Notes (Signed)
Clearance for colonoscopy and EGD received from [facility]- clearance faxed with ok to hold eliquis for 48 hours prior to procedure.  Faxed to [phone]. Confirmation received.

## 2016-09-14 ENCOUNTER — Ambulatory Visit: Payer: PPO

## 2016-09-14 DIAGNOSIS — I739 Peripheral vascular disease, unspecified: Secondary | ICD-10-CM | POA: Diagnosis not present

## 2016-09-14 LAB — VAS US LOWER EXTREMITY ARTERIAL DUPLEX
LEFT PERO MID SYS: -51 cm/s
LEFT POPLITEAL DIST DYS VEL: -8 cm/s
LEFT SFA DIST DYS VEL: 0 cm/s
LEFT SFA MID VEL: 0 cm/s
LEFT SFA PROX DYS VEL: 0 cm/s
Left ant tibial mid sys: 45 cm/s
Left popliteal dist sys PSV: -90 cm/s
Left popliteal prox sys PSV: 190 cm/s
Left super femoral dist sys PSV: -127 cm/s
Left super femoral mid sys PSV: -134 cm/s
Left super femoral prox sys PSV: 173 cm/s
RIGHT ANT MID TIBIAL DIA PSV: -5 cm/s
RIGHT ANT MID TIBIAL SYS PSV: -39 cm/s
RIGHT PERO MID SYS: -48 cm/s
RIGHT POPLITEAL DIST EDV: -3 cm/s
RIGHT POST TIB MID DIA: 0 cm/s
RIGHT POST TIB MID SYS: 54 cm/s
RIGHT SUPER FEMORAL PROX EDV: 0 cm/s
Right popliteal dist sys PSV: -68 cm/s
Right popliteal prox sys PSV: 64 cm/s
Right super femoral dist sys PSV: -62 cm/s
Right super femoral mid sys PSV: -99 cm/s
Right super femoral prox sys PSV: 159 cm/s
left post tibial mid sys: 44 cm/s

## 2016-09-15 ENCOUNTER — Other Ambulatory Visit: Payer: Self-pay | Admitting: Nurse Practitioner

## 2016-09-15 DIAGNOSIS — I739 Peripheral vascular disease, unspecified: Secondary | ICD-10-CM

## 2016-09-15 DIAGNOSIS — L738 Other specified follicular disorders: Secondary | ICD-10-CM | POA: Diagnosis not present

## 2016-09-15 DIAGNOSIS — L812 Freckles: Secondary | ICD-10-CM | POA: Diagnosis not present

## 2016-09-15 DIAGNOSIS — L570 Actinic keratosis: Secondary | ICD-10-CM | POA: Diagnosis not present

## 2016-09-15 DIAGNOSIS — L821 Other seborrheic keratosis: Secondary | ICD-10-CM | POA: Diagnosis not present

## 2016-09-15 DIAGNOSIS — L820 Inflamed seborrheic keratosis: Secondary | ICD-10-CM | POA: Diagnosis not present

## 2016-09-15 DIAGNOSIS — L578 Other skin changes due to chronic exposure to nonionizing radiation: Secondary | ICD-10-CM | POA: Diagnosis not present

## 2016-09-27 ENCOUNTER — Ambulatory Visit (INDEPENDENT_AMBULATORY_CARE_PROVIDER_SITE_OTHER): Payer: PPO | Admitting: Family Medicine

## 2016-09-27 ENCOUNTER — Encounter: Payer: Self-pay | Admitting: Family Medicine

## 2016-09-27 DIAGNOSIS — I4891 Unspecified atrial fibrillation: Secondary | ICD-10-CM

## 2016-09-27 DIAGNOSIS — G20 Parkinson's disease: Secondary | ICD-10-CM | POA: Diagnosis not present

## 2016-09-27 DIAGNOSIS — G4733 Obstructive sleep apnea (adult) (pediatric): Secondary | ICD-10-CM

## 2016-09-27 DIAGNOSIS — I2510 Atherosclerotic heart disease of native coronary artery without angina pectoris: Secondary | ICD-10-CM

## 2016-09-27 DIAGNOSIS — I10 Essential (primary) hypertension: Secondary | ICD-10-CM | POA: Diagnosis not present

## 2016-09-27 DIAGNOSIS — E785 Hyperlipidemia, unspecified: Secondary | ICD-10-CM

## 2016-09-27 DIAGNOSIS — R0600 Dyspnea, unspecified: Secondary | ICD-10-CM

## 2016-09-27 DIAGNOSIS — I5030 Unspecified diastolic (congestive) heart failure: Secondary | ICD-10-CM | POA: Diagnosis not present

## 2016-09-27 NOTE — Assessment & Plan Note (Signed)
Multifactorial in nature. Patient is frustrated by this. He has underlying sleep apnea, morbid obesity, diastolic heart failure, coronary disease. All of these entities are contributing. I advised that he needs weight loss. Arranging for him to see nutrition.

## 2016-09-27 NOTE — Assessment & Plan Note (Signed)
Stable  Follows with Neurology.

## 2016-09-27 NOTE — Assessment & Plan Note (Signed)
I discussed his underlying heart failure today. Patient states that he was unaware of the diagnosis. He was very unhappy about this. He states that he has never been told he has heart failure. I informed him that this was in his medical record and that it seems that he has been informed. Continue Lasix.

## 2016-09-27 NOTE — Progress Notes (Signed)
Subjective:  Patient ID: Andre [name], male    DOB: 05-Jul-1935  Age: 81 y.o. MRN: [id number]  CC: Establish care  HPI Andre [name] is a 81 y.o. male presents to the clinic today to establish care. Issues/concerns are below.   Dyspnea  Ongoing issue.   Has seen cardiology and pulmonology.  Had a recent negative stress test.  His dyspnea is particularly worse with exertion.  He is quite frustrated by this. Wants to discuss today.  HTN  Stable. Fairly well controlled on Lasix, Verapamil.  HLD  Uncontrolled. Cannot tolerate statin. Candidate for PSCK9.  HFpEF  Struggling with SOB.  Reports weight is stable.  Needs weight loss.  Compliant with lasix.  CAD s/p CABG  Stable. No chest pain. Follows with cardiology.   Afib  Stable on Verapamil, amiodarone, and Eliquis.  Obstructive sleep apnea  Stable. Compliant with CPAP.  Parkinson's  Stable currently on carbidopa levodopa. Followed by neurology.  PMH, Surgical Hx, Family Hx, Social History reviewed and updated as below.  Past Medical History:  Diagnosis Date  . A-fib ([place])   . Cervical spondylosis 10/01/2013  . Chronic diastolic CHF (congestive heart failure) ([place])    a. 07/2016 Echo: >55%.  . Coronary artery disease    a. 1998 s/p mini-cabg @ [facility] - pt reports one vessel bypass to LAD;  b. 07/2016 St Echo:  Inadequate HR (max 97) w/ hypertensive response (220/96). Ex time only 2:54 - stopped due to dyspnea and leg pain;  c.  08/2016 MV: EF 67%, no ischemia, low risk.  . Depression   . GERD (gastroesophageal reflux disease)   . Hyperlipidemia   . Hypertension   . PAF (paroxysmal atrial fibrillation) ([facility])    a. s/p DCCV-->maintaining sinus on amiodarone;  b. CHA2DS2VASc = 5-->eliquis.  . Parkinson's disease ([place])    tremors  . Pulmonary embolism ([place]) 2011  . 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 Sleep apnea    wears CPAP   Past Surgical History:  Procedure Laterality Date  . BACK SURGERY   1960  . CHOLECYSTECTOMY  2010  . CORONARY ARTERY BYPASS GRAFT  01/07/1997  . ELECTROPHYSIOLOGIC STUDY N/A 07/14/2015   Procedure: CARDIOVERSION;  Surgeon: Yolonda Kida, MD;  Location: [facility] ORS;  Service: Cardiovascular;  Laterality: N/A;  . ELECTROPHYSIOLOGIC STUDY N/A 10/12/2015   Procedure: CARDIOVERSION;  Surgeon: Minna Merritts, MD;  Location: [facility] ORS;  Service: Cardiovascular;  Laterality: N/A;  . OTHER SURGICAL HISTORY  1998   Bypass   Family History  Problem Relation Age of Onset  . Family history unknown: Yes   Social History  Substance Use Topics  . Smoking status: Former Smoker    Packs/day: 1.00    Years: 40.00    Types: Cigarettes, Pipe, Cigars    Quit date: 04/18/1972  . Smokeless tobacco: Former Systems developer    Types: Chew    Quit date: 04/18/1972  . Alcohol use 2.4 oz/week    2 Cans of beer, 2 Shots of liquor per week     Comment: per 2 weeks    Review of Systems  HENT: Positive for hearing loss and tinnitus.   Respiratory: Positive for shortness of breath.   Cardiovascular: Positive for palpitations and leg swelling.  Genitourinary: Positive for frequency.       Incontinence, difficulty starting/keeping stream, sexual difficulty.   Neurological: Positive for dizziness.  Psychiatric/Behavioral:       Sadness, anxiety, memory difficulty.   All other systems reviewed and  are negative.   Objective:   Today's Vitals: BP (!) 142/72   Pulse 68   Temp 97.6 F (36.4 C) (Oral)   Resp 16   Ht 5\' 6"  (1.676 m)   Wt 262 lb (118.8 kg)   SpO2 97%   BMI 42.29 kg/m   Physical Exam  Constitutional: He is oriented to person, place, and time.  Morbidly obese male in no acute distress.  HENT:  Head: Normocephalic and atraumatic.  Oropharynx clear. Dry.  Neck: Neck supple.  Cardiovascular: Normal rate and regular rhythm.   2/6 systolic murmur.  Pulmonary/Chest: Effort normal. He has no wheezes. He has no rales.  Abdominal: Soft. He exhibits no distension. There is no  tenderness.  Lymphadenopathy:    He has no cervical adenopathy.  Neurological: He is alert and oriented to person, place, and time.  Skin: Skin is warm.  Lower extremity with evidence of venous stasis.  Psychiatric: He has a normal mood and affect.  Vitals reviewed.  Assessment & Plan:   Problem List Items Addressed This Visit      Cardiovascular and Mediastinum   Hypertension    Stable.  Continue verapamil and Lasix.      Coronary artery disease    Stable currently. No chest pain.      Atrial fibrillation ([place])    Stable currently. In sinus. Continue amiodarone, eliquis, verapamil.       (HFpEF) heart failure with preserved ejection fraction ([place])    I discussed his underlying heart failure today. Patient states that he was unaware of the diagnosis. He was very unhappy about this. He states that he has never been told he has heart failure. I informed him that this was in his medical record and that it seems that he has been informed. Continue Lasix.        Respiratory   OSA (obstructive sleep apnea)    Continue CPAP.        Nervous and Auditory   Parkinson's disease ([place])    Stable. Follows with Neurology.         Other   Hyperlipidemia    Uncontrolled. Cannot tolerate statin. Needs PCSK9. Will discuss with cardiology.       Dyspnea    Multifactorial in nature. Patient is frustrated by this. He has underlying sleep apnea, morbid obesity, diastolic heart failure, coronary disease. All of these entities are contributing. I advised that he needs weight loss. Arranging for him to see nutrition.         Meds ordered this encounter  Medications  . doxycycline (VIBRAMYCIN) 100 MG capsule  . levothyroxine (SYNTHROID, LEVOTHROID) 50 MCG tablet   Follow-up: Return in about 1 month (around 10/27/2016).  [place]

## 2016-09-27 NOTE — Patient Instructions (Signed)
We will call with the referral to nutrition.  Continue your meds.  Follow up in 1 month.  Take care  Dr. Lacinda Axon

## 2016-09-27 NOTE — Assessment & Plan Note (Signed)
Continue CPAP.  

## 2016-09-27 NOTE — Assessment & Plan Note (Signed)
Stable currently. In sinus. Continue amiodarone, eliquis, verapamil.

## 2016-09-27 NOTE — Assessment & Plan Note (Signed)
Stable.  Continue verapamil and Lasix.

## 2016-09-27 NOTE — Assessment & Plan Note (Signed)
Stable currently. No chest pain.

## 2016-09-27 NOTE — Assessment & Plan Note (Signed)
Uncontrolled. Cannot tolerate statin. Needs PCSK9. Will discuss with cardiology.

## 2016-09-29 ENCOUNTER — Ambulatory Visit: Payer: PPO | Admitting: Nurse Practitioner

## 2016-10-03 DIAGNOSIS — G4733 Obstructive sleep apnea (adult) (pediatric): Secondary | ICD-10-CM | POA: Diagnosis not present

## 2016-10-05 ENCOUNTER — Ambulatory Visit: Payer: PPO

## 2016-10-05 DIAGNOSIS — I739 Peripheral vascular disease, unspecified: Secondary | ICD-10-CM

## 2016-10-05 DIAGNOSIS — I700 Atherosclerosis of aorta: Secondary | ICD-10-CM | POA: Diagnosis not present

## 2016-10-17 ENCOUNTER — Telehealth: Payer: Self-pay

## 2016-10-17 ENCOUNTER — Other Ambulatory Visit: Payer: Self-pay | Admitting: Family Medicine

## 2016-10-17 MED ORDER — VERAPAMIL HCL ER 240 MG PO TBCR: 240.0000 mg | EXTENDED_RELEASE_TABLET | Freq: Every day | ORAL | 6 refills | Status: DC

## 2016-10-17 MED ORDER — ATORVASTATIN CALCIUM 40 MG PO TABS: 40.0000 mg | ORAL_TABLET | Freq: Every day | ORAL | 3 refills | Status: DC

## 2016-10-17 NOTE — Telephone Encounter (Signed)
Spoke w/ pt.  Advised her of Ryan's recommendation.  He is agreeable to trying Lipitor 40 mg daily and will call back if he is unable to tolerate.  He also requests a refill on his verapamil.

## 2016-10-17 NOTE — Telephone Encounter (Signed)
-----   Message from Rise Mu, PA-C sent at 10/17/2016 11:21 AM EDT ----- Please try patient on Lipitor 40 mg daily. (must fail high-intensity statin prior to PCSK9.  ----- Message ----- From: Stana Bunting, RN Sent: 10/17/2016   9:57 AM To: Rogelia Mire, NP    ----- Message ----- From: Leeroy Bock, [facility] Sent: 10/17/2016   9:54 AM To: Stana Bunting, RN  Hi Mandi,  Just wanted to follow up to see if MD was willing to start pt on a low dose of either Crestor or Lipitor. I am also more than happy to see pt in lipid clinic to help manage and pursue PCSK9i when able.  Thanks, Jinny Blossom  ----- Message ----- From: Stana Bunting, RN Sent: 09/29/2016   1:55 PM To: Leeroy Bock, RPH    ----- Message ----- From: Rogelia Mire, NP Sent: 09/29/2016   1:54 PM To: Stana Bunting, RN  His lipid profile is in care everywhere. ----- Message ----- From: Stana Bunting, RN Sent: 09/29/2016  11:58 AM To: Rogelia Mire, NP    ----- Message ----- From: Leeroy Bock, [facility] Sent: 09/29/2016  11:46 AM To: Stana Bunting, RN  I do not see any lipid panel in Epic which is needed to pursue PCSK9i. Pt will also need to try a high intensity statin first, either Crestor or Lipitor. All I can see is pravastatin and lovastatin in the past without any documented side effects.  Thanks, Jinny Blossom    ----- Message ----- From: Stana Bunting, RN Sent: 09/29/2016  10:31 AM To: Leeroy Bock, RPH    ----- Message ----- From: Rogelia Mire, NP Sent: 09/29/2016   8:06 AM To: Valora Corporal, RN  Would it be possible to looking into a pcsk9 inhibitor for Mr. Critzer.  His lipids are quite high and he is statin intolerant.  Whichever would be less expensive for him is fine with me. ----- Message ----- From: Coral Spikes, DO Sent: 09/27/2016  10:43 PM To: Rogelia Mire, NP  Can we see if we can get him approved for PCSK9?  [place]

## 2016-10-18 ENCOUNTER — Other Ambulatory Visit: Payer: Self-pay | Admitting: Family Medicine

## 2016-10-18 NOTE — Telephone Encounter (Signed)
Patient is requesting to have this refilled today

## 2016-10-27 ENCOUNTER — Ambulatory Visit (INDEPENDENT_AMBULATORY_CARE_PROVIDER_SITE_OTHER): Payer: PPO | Admitting: Family Medicine

## 2016-10-27 ENCOUNTER — Encounter: Payer: Self-pay | Admitting: Family Medicine

## 2016-10-27 VITALS — BP 130/68 | HR 62 | Temp 97.9°F | Wt 260.5 lb

## 2016-10-27 DIAGNOSIS — E039 Hypothyroidism, unspecified: Secondary | ICD-10-CM | POA: Insufficient documentation

## 2016-10-27 DIAGNOSIS — E785 Hyperlipidemia, unspecified: Secondary | ICD-10-CM | POA: Diagnosis not present

## 2016-10-27 DIAGNOSIS — R079 Chest pain, unspecified: Secondary | ICD-10-CM | POA: Diagnosis not present

## 2016-10-27 DIAGNOSIS — R7303 Prediabetes: Secondary | ICD-10-CM | POA: Insufficient documentation

## 2016-10-27 DIAGNOSIS — I10 Essential (primary) hypertension: Secondary | ICD-10-CM

## 2016-10-27 LAB — TROPONIN I: TNIDX: 0.21 ug/l — ABNORMAL HIGH (ref 0.00–0.06)

## 2016-10-27 LAB — LIPID PANEL
Cholesterol: 173 mg/dL (ref 0–200)
HDL: 50.9 mg/dL (ref >39.00)
LDL Cholesterol: 95 mg/dL (ref 0–99)
NonHDL: 122.59
Total CHOL/HDL Ratio: 3
Triglycerides: 137 mg/dL (ref 0.0–149.0)
VLDL: 27.4 mg/dL (ref 0.0–40.0)

## 2016-10-27 LAB — TSH: TSH: 3.31 u[IU]/mL (ref 0.35–4.50)

## 2016-10-27 NOTE — Assessment & Plan Note (Signed)
Most recent TSH elevated. Repeating today. Will change Synthroid dose based on TSH.

## 2016-10-27 NOTE — Progress Notes (Signed)
ekg 

## 2016-10-27 NOTE — Patient Instructions (Signed)
We will call with your lab results.  Continue your medications.  Follow up in 6 months.  Take care  Dr. Lacinda Axon

## 2016-10-27 NOTE — Progress Notes (Signed)
Subjective:  Patient ID: Andre [name], male    DOB: 1935/10/08  Age: 81 y.o. MRN: [id number]  CC: Follow up, recent chest pain  HPI:  81 year old male with CHF, atrial fibrillation, coronary artery disease status post CABG, OSA, morbid obesity, Parkinson's disease, hypertension, hyperlipidemia presents for follow-up.  Hypertension  At goal. Currently on Lasix, verapamil.  Hyperlipidemia  Tolerating Lipitor.  No side effects.  Needs lipid panel in the near future.  Hypothyroidism  Uncontrolled. Last TSH was elevated in May.  Needs repeat TSH today.  Left arm pain, chest pain  Patient states that he got up to go the restroom Tuesday night.  His tremor was quite severe.  He suddenly developed severe left upper arm pain with radiation to the left side of the chest. He states that he had associated gas. No diaphoresis. He did note shortness of breath, although he has chronic shortness of breath.  He states that the pain in his arm was severe. It lasted for 2 hours and then resolved. He states that all of his symptoms resolved after 2 hours following taking a gas pill and aspirin.  Given his cardiac history, patient is concerned that he had a "mild heart attack" when this occurred.  He has no pain at this time.  Social Hx   Social History   Social History  . Marital status: Married    Spouse name: [name]  . Number of children: 2  . Years of education: 12   Occupational History  . Retired     Designer, television/film set   Social History Main Topics  . Smoking status: Former Smoker    Packs/day: 1.00    Years: 40.00    Types: Cigarettes, Pipe, Cigars    Quit date: 04/18/1972  . Smokeless tobacco: Former Systems developer    Types: Chew    Quit date: 04/18/1972  . Alcohol use 2.4 oz/week    2 Cans of beer, 2 Shots of liquor per week     Comment: per 2 weeks   . Drug use: No  . Sexual activity: Not Currently   Other Topics Concern  . None   Social History Narrative   Lives  w/ wife   Caffeine use: none   Right-handed    Review of Systems  Respiratory: Positive for shortness of breath.   Cardiovascular: Positive for chest pain.  Musculoskeletal:       L arm pain.   Objective:  BP 130/68 (BP Location: Right Arm, Patient Position: Sitting, Cuff Size: Large)   Pulse 62   Temp 97.9 F (36.6 C) (Oral)   Wt 260 lb 8 oz (118.2 kg)   BMI 42.05 kg/m   BP/Weight 10/27/2016 05/14/5168 0/04/7492  Systolic BP 496 759 163  Diastolic BP 68 72 70  Wt. (Lbs) 260.5 262 258  BMI 42.05 42.29 40.41    Physical Exam  Constitutional: He is oriented to person, place, and time. No distress.  HENT:  Head: Normocephalic and atraumatic.  Eyes: Conjunctivae are normal. No scleral icterus.  Cardiovascular: Normal rate and regular rhythm.   2/6 systolic murmur.  Pulmonary/Chest: Effort normal. He has no wheezes. He has no rales.  Abdominal: Soft. He exhibits no distension. There is no tenderness. There is no rebound and no guarding.  Neurological: He is alert and oriented to person, place, and time.  Psychiatric: He has a normal mood and affect.  Vitals reviewed.   Lab Results  Component Value Date   WBC 9.5  08/23/2016   HGB 16.3 08/23/2016   HCT 48.5 08/23/2016   PLT 165 08/23/2016   GLUCOSE 123 (H) 08/19/2016   ALT 13 (L) 01/29/2016   AST 23 01/29/2016   NA 136 08/19/2016   K 4.1 08/19/2016   CL 101 08/19/2016   CREATININE 1.10 08/19/2016   BUN 20 08/19/2016   CO2 25 08/19/2016   TSH 5.368 (H) 05/19/2016   INR 1.0 08/12/2014    Assessment & Plan:   Problem List Items Addressed This Visit      Cardiovascular and Mediastinum   Hypertension - Primary    At goal. Continue current meds.        Endocrine   Hypothyroidism    Most recent TSH elevated. Repeating today. Will change Synthroid dose based on TSH.      Relevant Orders   TSH     Other   Chest pain    New problem. Recent left arm pain and accompanying chest pain. EKG obtained today.  Normal sinus rhythm at the rate of 60. Poor R-wave progression. No evidence of ischemia. I doubt this is cardiac in nature. However given his history, obtaining troponin.      Relevant Orders   EKG 12-Lead (Completed)   Troponin I (Completed)   Hyperlipidemia    Tolerating Lipitor. Continue. We'll plan on lipid panel at next visit.      Relevant Orders   Lipid panel     Follow-up: Return in about 6 months (around 04/29/2017).  [place]

## 2016-10-27 NOTE — Assessment & Plan Note (Signed)
Tolerating Lipitor. Continue. We'll plan on lipid panel at next visit.

## 2016-10-27 NOTE — Assessment & Plan Note (Signed)
New problem. Recent left arm pain and accompanying chest pain. EKG obtained today. Normal sinus rhythm at the rate of 60. Poor R-wave progression. No evidence of ischemia. I doubt this is cardiac in nature. However given his history, obtaining troponin.

## 2016-10-27 NOTE — Assessment & Plan Note (Signed)
At goal. Continue current meds.  

## 2016-10-28 ENCOUNTER — Encounter: Payer: Self-pay | Admitting: Emergency Medicine

## 2016-10-28 ENCOUNTER — Inpatient Hospital Stay
Admission: EM | Admit: 2016-10-28 | Discharge: 2016-11-02 | DRG: 247 | Disposition: A | Discharge status: Home / Self Care | Payer: PPO | Attending: Internal Medicine | Admitting: Internal Medicine

## 2016-10-28 DIAGNOSIS — Z79899 Other long term (current) drug therapy: Secondary | ICD-10-CM | POA: Diagnosis not present

## 2016-10-28 DIAGNOSIS — E785 Hyperlipidemia, unspecified: Secondary | ICD-10-CM | POA: Diagnosis not present

## 2016-10-28 DIAGNOSIS — G20 Parkinson's disease: Secondary | ICD-10-CM | POA: Diagnosis not present

## 2016-10-28 DIAGNOSIS — Z951 Presence of aortocoronary bypass graft: Secondary | ICD-10-CM | POA: Diagnosis not present

## 2016-10-28 DIAGNOSIS — I25119 Atherosclerotic heart disease of native coronary artery with unspecified angina pectoris: Secondary | ICD-10-CM | POA: Diagnosis not present

## 2016-10-28 DIAGNOSIS — R0789 Other chest pain: Secondary | ICD-10-CM | POA: Diagnosis not present

## 2016-10-28 DIAGNOSIS — Z86711 Personal history of pulmonary embolism: Secondary | ICD-10-CM | POA: Diagnosis not present

## 2016-10-28 DIAGNOSIS — I4891 Unspecified atrial fibrillation: Secondary | ICD-10-CM | POA: Diagnosis not present

## 2016-10-28 DIAGNOSIS — I214 Non-ST elevation (NSTEMI) myocardial infarction: Secondary | ICD-10-CM | POA: Diagnosis not present

## 2016-10-28 DIAGNOSIS — Z6841 Body Mass Index (BMI) 40.0 and over, adult: Secondary | ICD-10-CM

## 2016-10-28 DIAGNOSIS — I444 Left anterior fascicular block: Secondary | ICD-10-CM | POA: Diagnosis not present

## 2016-10-28 DIAGNOSIS — Z9114 Patient's other noncompliance with medication regimen: Secondary | ICD-10-CM

## 2016-10-28 DIAGNOSIS — D696 Thrombocytopenia, unspecified: Secondary | ICD-10-CM | POA: Diagnosis present

## 2016-10-28 DIAGNOSIS — I440 Atrioventricular block, first degree: Secondary | ICD-10-CM | POA: Diagnosis present

## 2016-10-28 DIAGNOSIS — Z7951 Long term (current) use of inhaled steroids: Secondary | ICD-10-CM

## 2016-10-28 DIAGNOSIS — R778 Other specified abnormalities of plasma proteins: Secondary | ICD-10-CM

## 2016-10-28 DIAGNOSIS — G20A1 Parkinson's disease without dyskinesia, without mention of fluctuations: Secondary | ICD-10-CM | POA: Diagnosis present

## 2016-10-28 DIAGNOSIS — I482 Chronic atrial fibrillation: Secondary | ICD-10-CM | POA: Diagnosis present

## 2016-10-28 DIAGNOSIS — I5032 Chronic diastolic (congestive) heart failure: Secondary | ICD-10-CM | POA: Diagnosis present

## 2016-10-28 DIAGNOSIS — I25110 Atherosclerotic heart disease of native coronary artery with unstable angina pectoris: Secondary | ICD-10-CM | POA: Diagnosis present

## 2016-10-28 DIAGNOSIS — Z7901 Long term (current) use of anticoagulants: Secondary | ICD-10-CM | POA: Diagnosis not present

## 2016-10-28 DIAGNOSIS — I110 Hypertensive heart disease with heart failure: Secondary | ICD-10-CM | POA: Diagnosis not present

## 2016-10-28 DIAGNOSIS — G4733 Obstructive sleep apnea (adult) (pediatric): Secondary | ICD-10-CM | POA: Diagnosis not present

## 2016-10-28 DIAGNOSIS — F410 Panic disorder [episodic paroxysmal anxiety] without agoraphobia: Secondary | ICD-10-CM | POA: Diagnosis present

## 2016-10-28 DIAGNOSIS — R748 Abnormal levels of other serum enzymes: Secondary | ICD-10-CM

## 2016-10-28 DIAGNOSIS — I10 Essential (primary) hypertension: Secondary | ICD-10-CM | POA: Diagnosis not present

## 2016-10-28 DIAGNOSIS — Z888 Allergy status to other drugs, medicaments and biological substances status: Secondary | ICD-10-CM

## 2016-10-28 DIAGNOSIS — Z87891 Personal history of nicotine dependence: Secondary | ICD-10-CM

## 2016-10-28 DIAGNOSIS — E782 Mixed hyperlipidemia: Secondary | ICD-10-CM | POA: Diagnosis not present

## 2016-10-28 DIAGNOSIS — R7989 Other specified abnormal findings of blood chemistry: Secondary | ICD-10-CM | POA: Diagnosis not present

## 2016-10-28 DIAGNOSIS — K219 Gastro-esophageal reflux disease without esophagitis: Secondary | ICD-10-CM | POA: Diagnosis present

## 2016-10-28 DIAGNOSIS — I5030 Unspecified diastolic (congestive) heart failure: Secondary | ICD-10-CM | POA: Diagnosis not present

## 2016-10-28 DIAGNOSIS — I480 Paroxysmal atrial fibrillation: Secondary | ICD-10-CM | POA: Diagnosis not present

## 2016-10-28 DIAGNOSIS — I2510 Atherosclerotic heart disease of native coronary artery without angina pectoris: Secondary | ICD-10-CM | POA: Diagnosis not present

## 2016-10-28 LAB — CBC WITH DIFFERENTIAL/PLATELET
Basophils Absolute: 0.1 10*3/uL (ref 0–0.1)
Basophils Relative: 1 %
Eosinophils Absolute: 0.1 10*3/uL (ref 0–0.7)
Eosinophils Relative: 1 %
HCT: 48.9 % (ref 40.0–52.0)
Hemoglobin: 16.4 g/dL (ref 13.0–18.0)
Lymphocytes Relative: 23 %
Lymphs Abs: 2.2 10*3/uL (ref 1.0–3.6)
MCH: 30.3 pg (ref 26.0–34.0)
MCHC: 33.5 g/dL (ref 32.0–36.0)
MCV: 90.5 fL (ref 80.0–100.0)
Monocytes Absolute: 0.8 10*3/uL (ref 0.2–1.0)
Monocytes Relative: 9 %
Neutro Abs: 6.5 10*3/uL (ref 1.4–6.5)
Neutrophils Relative %: 66 %
Platelets: 114 10*3/uL — ABNORMAL LOW (ref 150–440)
RBC: 5.4 MIL/uL (ref 4.40–5.90)
RDW: 15.1 % — ABNORMAL HIGH (ref 11.5–14.5)
WBC: 9.7 10*3/uL (ref 3.8–10.6)

## 2016-10-28 LAB — BASIC METABOLIC PANEL
Anion gap: 9 (ref 5–15)
BUN: 20 mg/dL (ref 6–20)
CO2: 26 mmol/L (ref 22–32)
Calcium: 9.3 mg/dL (ref 8.9–10.3)
Chloride: 103 mmol/L (ref 101–111)
Creatinine, Ser: 1.14 mg/dL (ref 0.61–1.24)
GFR calc Af Amer: >60 mL/min (ref >60)
GFR calc non Af Amer: 60 mL/min — ABNORMAL LOW (ref >60)
Glucose, Bld: 139 mg/dL — ABNORMAL HIGH (ref 65–99)
Potassium: 3.9 mmol/L (ref 3.5–5.1)
Sodium: 138 mmol/L (ref 135–145)

## 2016-10-28 LAB — TROPONIN I
Troponin I: 0.12 ng/mL (ref <0.03)
Troponin I: 0.13 ng/mL (ref <0.03)
Troponin I: 0.11 ng/mL (ref <0.03)

## 2016-10-28 LAB — APTT
aPTT: 128 seconds — ABNORMAL HIGH (ref 24–36)
aPTT: 72 seconds — ABNORMAL HIGH (ref 24–36)

## 2016-10-28 LAB — HEPARIN LEVEL (UNFRACTIONATED): Heparin Unfractionated: 3.26 IU/mL — ABNORMAL HIGH (ref 0.30–0.70)

## 2016-10-28 LAB — PROTIME-INR
INR: 1.22
Prothrombin Time: 15.5 seconds — ABNORMAL HIGH (ref 11.4–15.2)

## 2016-10-28 MED ORDER — HEPARIN (PORCINE) IN NACL 100-0.45 UNIT/ML-% IJ SOLN
1150.0000 [IU]/h | INTRAMUSCULAR | Status: DC
  Administered 2016-10-28 – 2016-10-30 (×3): 1150 [IU]/h via INTRAVENOUS
  Filled 2016-10-28 (×4): qty 250

## 2016-10-28 MED ORDER — CARVEDILOL 6.25 MG PO TABS
6.2500 mg | ORAL_TABLET | Freq: Two times a day (BID) | ORAL | Status: DC
  Administered 2016-10-28 – 2016-11-02 (×9): 6.25 mg via ORAL
  Filled 2016-10-28 (×11): qty 1

## 2016-10-28 MED ORDER — ATORVASTATIN CALCIUM 20 MG PO TABS
40.0000 mg | ORAL_TABLET | Freq: Every day | ORAL | Status: DC
  Administered 2016-10-28 – 2016-11-02 (×6): 40 mg via ORAL
  Filled 2016-10-28 (×6): qty 2

## 2016-10-28 MED ORDER — SODIUM CHLORIDE 0.9 % IV SOLN
250.0000 mL | INTRAVENOUS | Status: DC | PRN
Start: 2016-10-28 — End: 2016-10-31

## 2016-10-28 MED ORDER — LEVOTHYROXINE SODIUM 50 MCG PO TABS
50.0000 ug | ORAL_TABLET | Freq: Every day | ORAL | Status: DC
  Administered 2016-10-29 – 2016-11-02 (×4): 50 ug via ORAL
  Filled 2016-10-28 (×4): qty 1

## 2016-10-28 MED ORDER — ALBUTEROL SULFATE (2.5 MG/3ML) 0.083% IN NEBU: 2.5000 mg | INHALATION_SOLUTION | RESPIRATORY_TRACT | Status: DC | PRN

## 2016-10-28 MED ORDER — ONDANSETRON HCL 4 MG/2ML IJ SOLN: 4.0000 mg | Freq: Four times a day (QID) | INTRAMUSCULAR | Status: DC | PRN

## 2016-10-28 MED ORDER — CARBIDOPA-LEVODOPA 25-250 MG PO TABS
2.0000 | ORAL_TABLET | Freq: Three times a day (TID) | ORAL | Status: DC
Start: 2016-10-28 — End: 2016-10-31
  Administered 2016-10-28 – 2016-10-30 (×8): 2 via ORAL
  Filled 2016-10-28 (×10): qty 2

## 2016-10-28 MED ORDER — AMIODARONE HCL 200 MG PO TABS
200.0000 mg | ORAL_TABLET | Freq: Every day | ORAL | Status: DC
  Administered 2016-10-28 – 2016-11-02 (×6): 200 mg via ORAL
  Filled 2016-10-28 (×6): qty 1

## 2016-10-28 MED ORDER — ALBUTEROL SULFATE HFA 108 (90 BASE) MCG/ACT IN AERS: 2.0000 | INHALATION_SPRAY | RESPIRATORY_TRACT | Status: DC | PRN

## 2016-10-28 MED ORDER — ASPIRIN 81 MG PO CHEW
324.0000 mg | CHEWABLE_TABLET | Freq: Once | ORAL | Status: AC
  Administered 2016-10-28: 324 mg via ORAL
  Filled 2016-10-28: qty 4

## 2016-10-28 MED ORDER — CARBIDOPA-LEVODOPA 25-250 MG PO TABS
1.0000 | ORAL_TABLET | Freq: Every day | ORAL | Status: DC
  Filled 2016-10-28 (×4): qty 1

## 2016-10-28 MED ORDER — NITROGLYCERIN 0.4 MG SL SUBL
0.4000 mg | SUBLINGUAL_TABLET | SUBLINGUAL | Status: DC | PRN
  Administered 2016-10-30: 0.4 mg via SUBLINGUAL
  Filled 2016-10-28: qty 1

## 2016-10-28 MED ORDER — HEPARIN BOLUS VIA INFUSION
4000.0000 [IU] | Freq: Once | INTRAVENOUS | Status: AC
Start: 2016-10-28 — End: 2016-10-28
  Administered 2016-10-28: 4000 [IU] via INTRAVENOUS
  Filled 2016-10-28: qty 4000

## 2016-10-28 MED ORDER — ACETAMINOPHEN 325 MG PO TABS: 650.0000 mg | ORAL_TABLET | ORAL | Status: DC | PRN

## 2016-10-28 MED ORDER — ZOLPIDEM TARTRATE 5 MG PO TABS
5.0000 mg | ORAL_TABLET | Freq: Every evening | ORAL | Status: DC | PRN
  Administered 2016-10-29 – 2016-11-01 (×4): 5 mg via ORAL
  Filled 2016-10-28 (×4): qty 1

## 2016-10-28 MED ORDER — ASPIRIN 81 MG PO CHEW: 81.0000 mg | CHEWABLE_TABLET | ORAL | Status: DC

## 2016-10-28 MED ORDER — SODIUM CHLORIDE 0.9 % IV SOLN
INTRAVENOUS | Status: DC
  Administered 2016-10-29: 06:00:00 via INTRAVENOUS

## 2016-10-28 MED ORDER — MIRABEGRON ER 50 MG PO TB24
50.0000 mg | ORAL_TABLET | Freq: Every day | ORAL | Status: DC
  Administered 2016-10-28 – 2016-11-01 (×4): 50 mg via ORAL
  Filled 2016-10-28 (×6): qty 1

## 2016-10-28 MED ORDER — CARBIDOPA-LEVODOPA 25-250 MG PO TBDP: 1.0000 | ORAL_TABLET | Freq: Every day | ORAL | Status: DC

## 2016-10-28 MED ORDER — PANTOPRAZOLE SODIUM 40 MG PO TBEC
40.0000 mg | DELAYED_RELEASE_TABLET | Freq: Every day | ORAL | Status: DC
  Administered 2016-10-28 – 2016-11-02 (×6): 40 mg via ORAL
  Filled 2016-10-28 (×6): qty 1

## 2016-10-28 MED ORDER — SODIUM CHLORIDE 0.9 % IV SOLN
INTRAVENOUS | Status: DC
  Administered 2016-10-28: 14:00:00 via INTRAVENOUS

## 2016-10-28 MED ORDER — SODIUM CHLORIDE 0.9% FLUSH: 3.0000 mL | INTRAVENOUS | Status: DC | PRN

## 2016-10-28 MED ORDER — VERAPAMIL HCL ER 240 MG PO TBCR
240.0000 mg | EXTENDED_RELEASE_TABLET | Freq: Every day | ORAL | Status: DC
  Filled 2016-10-28: qty 1

## 2016-10-28 MED ORDER — ASPIRIN EC 81 MG PO TBEC
81.0000 mg | DELAYED_RELEASE_TABLET | Freq: Every day | ORAL | Status: DC
  Administered 2016-10-29 – 2016-11-02 (×5): 81 mg via ORAL
  Filled 2016-10-28 (×5): qty 1

## 2016-10-28 MED ORDER — SODIUM CHLORIDE 0.9% FLUSH
3.0000 mL | Freq: Two times a day (BID) | INTRAVENOUS | Status: DC
  Administered 2016-10-28 – 2016-10-30 (×2): 3 mL via INTRAVENOUS

## 2016-10-28 MED ORDER — FUROSEMIDE 20 MG PO TABS
20.0000 mg | ORAL_TABLET | Freq: Every day | ORAL | Status: DC
  Administered 2016-10-28 – 2016-11-02 (×6): 20 mg via ORAL
  Filled 2016-10-28 (×8): qty 1

## 2016-10-28 MED ORDER — BUSPIRONE HCL 5 MG PO TABS
7.5000 mg | ORAL_TABLET | Freq: Two times a day (BID) | ORAL | Status: DC
  Administered 2016-10-28 – 2016-11-02 (×10): 7.5 mg via ORAL
  Filled 2016-10-28 (×2): qty 2
  Filled 2016-10-28 (×2): qty 1.5
  Filled 2016-10-28 (×3): qty 2
  Filled 2016-10-28: qty 1
  Filled 2016-10-28 (×2): qty 2

## 2016-10-28 MED ORDER — ALPRAZOLAM 0.25 MG PO TABS
0.2500 mg | ORAL_TABLET | Freq: Two times a day (BID) | ORAL | Status: DC | PRN
  Administered 2016-10-30 (×2): 0.25 mg via ORAL
  Filled 2016-10-28 (×2): qty 1

## 2016-10-28 MED ORDER — FLUTICASONE PROPIONATE 50 MCG/ACT NA SUSP
1.0000 | Freq: Every day | NASAL | Status: DC
  Administered 2016-10-29 – 2016-10-30 (×2): 1 via NASAL
  Filled 2016-10-28: qty 16

## 2016-10-28 NOTE — ED Notes (Signed)
Blue top sent to lab. 

## 2016-10-28 NOTE — Progress Notes (Signed)
Pharmacy note:  Per RN patient states he takes carbidopa/levodopa 25/250mg - 2 tabs 3 times a day, instead of 1 tablet 6 times a day. Discussed with Dr. Estanislado Spire. Will change order to how patient takes medication (at 1000, 1600, 2200) per Dr Bridgett Larsson.  Chinita Greenland PharmD Clinical Pharmacist 10/28/2016

## 2016-10-28 NOTE — Progress Notes (Addendum)
ANTICOAGULATION CONSULT NOTE - Initial Consult  Pharmacy Consult for Heparin Drip Indication: chest pain/ACS  Allergies  Allergen Reactions  . Pravastatin Other (See Comments)  . Prednisone Other (See Comments)    Pt states that med makes him hyper Pt states that med makes him hyper    Patient Measurements: Height: 5\' 6"  (167.6 cm) Weight: 260 lb (117.9 kg) IBW/kg (Calculated) : 63.8 Heparin Dosing Weight: 91.2 kg  Vital Signs: Temp Source: Oral (07/13 0852) BP: 155/96 (07/13 1146) Pulse Rate: 74 (07/13 1146)  Labs:  Recent Labs  10/28/16 0912  HGB 16.4  HCT 48.9  PLT 114*  CREATININE 1.14  TROPONINI 0.12*    Estimated Creatinine Clearance: 64.5 mL/min (by C-G formula based on SCr of 1.14 mg/dL).   Medical History: Past Medical History:  Diagnosis Date  . Cervical spondylosis 10/01/2013  . Chronic diastolic CHF (congestive heart failure) ([place])    a. 07/2016 Echo: >55%.  . Coronary artery disease    a. 1998 s/p mini-cabg @ [facility] - pt reports one vessel bypass to LAD;  b. 07/2016 St Echo:  Inadequate HR (max 97) w/ hypertensive response (220/96). Ex time only 2:54 - stopped due to dyspnea and leg pain;  c.  08/2016 MV: EF 67%, no ischemia, low risk.  . Depression   . GERD (gastroesophageal reflux disease)   . Hyperlipidemia   . Hypertension   . PAF (paroxysmal atrial fibrillation) ([facility])    a. s/p DCCV-->maintaining sinus on amiodarone;  b. CHA2DS2VASc = 5-->eliquis.  . Parkinson's disease ([place])    tremors  . Pulmonary embolism ([place]) 2011  . 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 Sleep apnea    wears CPAP    Medications:  Scheduled:  . heparin  4,000 Units Intravenous Once   Infusions:  . heparin      Assessment: 81 yo Male with Elevated Troponin, chest discomfort. Patient w/ Hx of PE, Afib-on Apixaban 5 mg bid with last dose noted on Med Rec as being yesterday 10/27/16 at 1800.  Baseline: * Blood (Blue top) drawn in ED was hemolyzed- Lab to redraw.    APTT: 128 (drawn ~ 1 hour after Heparin bolus given and drip running- d/t blood drawn in ER hemolyzed and patient went to floor). Will recheck at 1900.  HL: 3.26 (Patient on Apixaban which can affect the level and was drawn ~1 hour after bolus/drip started) INR: 1.22 PLT: 114,  Hgb 16.4  Goal of Therapy:  Heparin level 0.3-0.7 units/ml aPTT 66-102 seconds Monitor platelets by anticoagulation protocol: Yes   Plan:  Give 4000 units bolus x 1 Start heparin infusion at 1150 units/hr Check anti-Xa level in 8 hours and daily while on heparin Continue to monitor H&H and platelets   Begin Heparin Drip at least 12 hours after last apixaban dose (which was yesterday 10/27/16 at 1800) so can start now.  Heparin bolus/drip started at 1247. APTT/Heparin level drawn at 1402. Will follow aPTT levels for dosing until HL correlates with aPTT. Plan to check HL once daily until correlates. Next aPTT at 1900.   Tianne Plott A 10/28/2016,11:47 AM

## 2016-10-28 NOTE — Progress Notes (Signed)
Patient arrived on unit from the ED via wheelchair. Patient AAOx4. Denies chest pain and SOB at this time. NAD noted. IV to right hand CDI Patient and family oriented to the room. Plan of care discussed with patient and family. Pt/family verbalized understanding. Bed in lowest position, call bell in reach. Will continue to monitor.

## 2016-10-28 NOTE — ED Provider Notes (Signed)
[facility] Emergency Department Provider Note   ____________________________________________   I have reviewed the triage vital signs and the nursing notes.   HISTORY  Chief Complaint Elevated Troponin   History limited by: Not Limited   HPI Andre [name] is a 81 y.o. male who presents to the emergency department today because of elevated troponin checked at [place] office yesterday. Patient was seen his doctor because of issues with left arm pain. States it's been going on for the past couple weeks. It appears to be primarily happen at night. States the pain starts above his elbow goes into his left chest. He does state however that it is not truly pain in his chest more of a discomfort. In addition the patient has been having issues with shortness of breath for the past few months. States he has been to a pulmonologist. Does have a history of bypass surgery roughly 20 years ago.   Past Medical History:  Diagnosis Date  . A-fib ([place])   . Cervical spondylosis 10/01/2013  . Chronic diastolic CHF (congestive heart failure) ([place])    a. 07/2016 Echo: >55%.  . Coronary artery disease    a. 1998 s/p mini-cabg @ [facility] - pt reports one vessel bypass to LAD;  b. 07/2016 St Echo:  Inadequate HR (max 97) w/ hypertensive response (220/96). Ex time only 2:54 - stopped due to dyspnea and leg pain;  c.  08/2016 MV: EF 67%, no ischemia, low risk.  . Depression   . GERD (gastroesophageal reflux disease)   . Hyperlipidemia   . Hypertension   . PAF (paroxysmal atrial fibrillation) (HCC)    a. s/p DCCV-->maintaining sinus on amiodarone;  b. CHA2DS2VASc = 5-->eliquis.  . Parkinson's disease ([place])    tremors  . Pulmonary embolism ([place]) 2011  . 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 Sleep apnea    wears CPAP    Patient Active Problem List   Diagnosis Date Noted  . Prediabetes 10/27/2016  . Hypothyroidism 10/27/2016  . Chest pain 10/27/2016  . (HFpEF) heart failure with  preserved ejection fraction ([place]) 09/27/2016  . Parkinson's disease ([place]) 06/30/2016  . Atrial fibrillation ([place])   . BMI 40.0-44.9, adult ([place]) 05/06/2015  . Erythrocytosis 01/28/2015  . Atherosclerosis of abdominal aorta ([place]) 12/08/2014  . Barrett's esophagus 12/31/2013  . DDD (degenerative disc disease), cervical 10/01/2013  . DDD (degenerative disc disease), lumbar 10/01/2013  . Coronary artery disease 10/30/2012  . GERD (gastroesophageal reflux disease) 10/30/2012  . Hyperlipidemia 10/30/2012  . Hypertension 10/30/2012  . Dyspnea 09/10/2012  . Allergic rhinitis 09/10/2012  . OSA (obstructive sleep apnea) 09/10/2012    Past Surgical History:  Procedure Laterality Date  . BACK SURGERY  1960  . CHOLECYSTECTOMY  2010  . CORONARY ARTERY BYPASS GRAFT  01/07/1997  . ELECTROPHYSIOLOGIC STUDY N/A 07/14/2015   Procedure: CARDIOVERSION;  Surgeon: Yolonda Kida, MD;  Location: [facility] ORS;  Service: Cardiovascular;  Laterality: N/A;  . ELECTROPHYSIOLOGIC STUDY N/A 10/12/2015   Procedure: CARDIOVERSION;  Surgeon: Minna Merritts, MD;  Location: [facility] ORS;  Service: Cardiovascular;  Laterality: N/A;  . OTHER SURGICAL HISTORY  1998   Bypass    Prior to Admission medications   Medication Sig Start Date End Date Taking? Authorizing Provider  albuterol (VENTOLIN HFA) 108 (90 BASE) MCG/ACT inhaler Inhale 2 puffs into the lungs every 4 (four) hours as needed for wheezing.    [provider]  amiodarone (PACERONE) 200 MG tablet Take 200 mg by mouth daily.  07/30/15  [provider]  apixaban (ELIQUIS) 5 MG TABS tablet Take 5 mg by mouth 2 (two) times daily. Reported on 07/31/2015    [provider]  atorvastatin (LIPITOR) 40 MG tablet Take 1 tablet (40 mg total) by mouth daily. 10/17/16 01/15/17  Rogelia Mire, NP  busPIRone (BUSPAR) 7.5 MG tablet Take 7.5 mg by mouth 2 (two) times daily.    [provider]  carbidopa-levodopa (PARCOPA) 25-250 MG per  disintegrating tablet Take 1 tablet by mouth 6 (six) times daily. Reported on 07/14/2015    [provider]  cyclobenzaprine (FLEXERIL) 5 MG tablet Take 5 mg by mouth daily.    [provider]  EPINEPHrine 0.3 mg/0.3 mL IJ SOAJ injection Inject 0.3 mg as directed once as needed (allergic reaction). Reported on 10/27/2015 07/01/13   [provider]  esomeprazole (NEXIUM) 20 MG capsule Take 40 mg by mouth daily at 12 noon.    [provider]  fluticasone (FLONASE) 50 MCG/ACT nasal spray TAKE 2 PUFFS IN EACH NOSTRIL EVERY DAY 10/18/16   Coral Spikes, DO  furosemide (LASIX) 40 MG tablet Take 1/2 tablet (20 mg) by mouth once every other day 01/12/16   Deboraha Sprang, MD  levothyroxine (SYNTHROID, LEVOTHROID) 50 MCG tablet  09/07/16   [provider]  mirabegron ER (MYRBETRIQ) 50 MG TB24 tablet Take 50 mg by mouth daily.    [provider]  verapamil (CALAN-SR) 240 MG CR tablet Take 1 tablet (240 mg total) by mouth at bedtime. 10/17/16   Rogelia Mire, NP    Allergies Pravastatin and Prednisone  Family History  Problem Relation Age of Onset  . Family history unknown: Yes    Social History Social History  Substance Use Topics  . Smoking status: Former Smoker    Packs/day: 1.00    Years: 40.00    Types: Cigarettes, Pipe, Cigars    Quit date: 04/18/1972  . Smokeless tobacco: Former Systems developer    Types: Chew    Quit date: 04/18/1972  . Alcohol use 2.4 oz/week    2 Cans of beer, 2 Shots of liquor per week     Comment: per 2 weeks     Review of Systems Constitutional: No fever/chills Eyes: No visual changes. ENT: No sore throat. Cardiovascular: Positive for chest discomfort. Respiratory: Positive for shortness of breath. Gastrointestinal: No abdominal pain.  No nausea, no vomiting.  No diarrhea.   Genitourinary: Negative for dysuria. Musculoskeletal: Positive for left arm pain. Skin: Negative for rash. Neurological: Negative for headaches,  focal weakness or numbness.  ____________________________________________   PHYSICAL EXAM:  VITAL SIGNS: ED Triage Vitals  Enc Vitals Group     BP 10/28/16 0852 (!) 177/105     Pulse Rate 10/28/16 0852 77     Resp 10/28/16 0852 20     Temp --      Temp Source 10/28/16 0852 Oral     SpO2 10/28/16 0852 95 %     Weight 10/28/16 0853 260 lb (117.9 kg)   Constitutional: Alert and oriented. Well appearing and in no distress. Eyes: Conjunctivae are normal.  ENT   Head: Normocephalic and atraumatic.   Nose: No congestion/rhinnorhea.   Mouth/Throat: Mucous membranes are moist.   Neck: No stridor. Hematological/Lymphatic/Immunilogical: No cervical lymphadenopathy. Cardiovascular: Normal rate, regular rhythm.  No murmurs, rubs, or gallops. Respiratory: Normal respiratory effort without tachypnea nor retractions. Breath sounds are clear and equal bilaterally. No wheezes/rales/rhonchi. Gastrointestinal: Soft and non tender. No rebound. No guarding.  Genitourinary: Deferred Musculoskeletal: Normal range of motion in all extremities. Trace lower extremity edema bilaterally.  Neurologic:  Normal speech and language. No gross focal neurologic deficits are appreciated.  Skin:  Skin is warm, dry and intact. No rash noted. Psychiatric: Mood and affect are normal. Speech and behavior are normal. Patient exhibits appropriate insight and judgment.  ____________________________________________    LABS (pertinent positives/negatives)  Labs Reviewed  CBC WITH DIFFERENTIAL/PLATELET - Abnormal; Notable for the following:       Result Value   RDW 15.1 (*)    Platelets 114 (*)    All other components within normal limits  BASIC METABOLIC PANEL - Abnormal; Notable for the following:    Glucose, Bld 139 (*)    GFR calc non Af Amer 60 (*)    All other components within normal limits  TROPONIN I - Abnormal; Notable for the following:    Troponin I 0.12 (*)    All other components within  normal limits     ____________________________________________   EKG  I, Nance Pear, attending physician, personally viewed and interpreted this EKG  EKG Time: 0850 Rate: 79 Rhythm: sinus rhythm with 1st degree av block with PVC Axis: unclear based on EKG Intervals: qtc 405 QRS: narrow ST changes: no st elevation Impression: abnormal ekg   ____________________________________________    RADIOLOGY  None   ____________________________________________   PROCEDURES  Procedures  CRITICAL CARE Performed by: Nance Pear   Total critical care time: 30 minutes  Critical care time was exclusive of separately billable procedures and treating other patients.  Critical care was necessary to treat or prevent imminent or life-threatening deterioration.  Critical care was time spent personally by me on the following activities: development of treatment plan with patient and/or surrogate as well as nursing, discussions with consultants, evaluation of patient's response to treatment, examination of patient, obtaining history from patient or surrogate, ordering and performing treatments and interventions, ordering and review of laboratory studies, ordering and review of radiographic studies, pulse oximetry and re-evaluation of patient's condition.  ____________________________________________   INITIAL IMPRESSION / ASSESSMENT AND PLAN / ED COURSE  Pertinent labs & imaging results that were available during my care of the patient were reviewed by me and considered in my medical decision making (see chart for details).  Patient presented to the emergency department today because of concerns for elevated troponin found at doctor's office yesterday. Patient has been having left arm pain and chest discomfort. Patient's EKG without any ST elevation. Troponin here was elevated. Patient was given aspirin. Will plan on  admission.  ____________________________________________   FINAL CLINICAL IMPRESSION(S) / ED DIAGNOSES  Final diagnoses:  Chest discomfort  Elevated troponin     Note: This dictation was prepared with Dragon dictation. Any transcriptional errors that result from this process are unintentional     Nance Pear, MD 10/28/16 1041

## 2016-10-28 NOTE — Consult Note (Signed)
Cardiology Consultation Note  Patient ID: Andre [name], MRN: [id number], DOB/AGE: 1935-07-04 81 y.o. Admit date: 10/28/2016   Date of Consult: 10/28/2016 Primary Physician: Coral Spikes, DO Primary Cardiologist: Dr. Caryl Comes, MD Requesting Physician: Dr. Bridgett Larsson, MD  Chief Complaint: Chest pain/elevated troponin as an outpatient Reason for Consult: NSTEMI  HPI: Andre [name] is a 81 y.o. male who is being seen today for the evaluation of NSTEMI at the request of Dr. Bridgett Larsson, MD. Patient has a h/o CAD s/p CABG x 1 in 1998 at [facility], HFpEF, PAF s/p DCCV on Eliquis, hypertensive heart disease, PE, Parkinson's disease, and morbid obesity who presented to the [facility] on 7/13 with elevated troponin that was drawn as an outpatient by PCP on 7/12.   He was recently seen in the clinic in late April and early May for a 6-12 month history of progressive DOE in the setting of noncompliance with Lasix. Prior to those visits he had undergone stress echocardiography with [facility], which was not conclusive in the setting of an inadequate heart rate. He was diuresed with Lasix and underwent Lexiscan Myoview to evaluate for high-risk ischemia on 08/18/16 that showed no significant ischemia, normal wall motion, EF 67%, no EKG changes concerning for ischemia. Overall, this was a low risk scan. He has also been seen by pulmonology given hsi DOE and been scheduled for PFTs/6 minute walk. When he was last seen on 5/4 his exercise tolerance had improved. He was still noted to have some degree of volume overload at that time and continued diuresis was advised.   He was seen by PCP on 10/27/16 with complaints of chest and left arm pain for the past 3-4 nights. There was some associated gas and SOB. Pain would last for ~ 2 hours and resolve following ASA and "gas pill." Never with exertional pain. Patient pressure-washed his drive way this week without any symptoms. EKG obtained in the PCP's office showed sinus rhythm, 60 bpm, left axis  deviation, LAFB, poor R wave progression, nonspecific inferolateral st/t changes. Outpatient troponin was drawn at that time. Patient was called this morning by PCP with elevated troponin of 0.21. He was advised to go to the ED.   Upon his arrival to [facility] he was noted to have a BP of 177/105, heart rate 77 bpm, respirations 20, oxygen saturation 95% on room air, weight 260 pounds. EKG showed NSR, 79 bpm, 1st degree AV block, LAFB, occasional PVCs, possible subendocardial injury. Labs showed repeat troponin of 0.12, WBC 9.7, HGB 16.4, PLT pending, SCr 1.14, K+ 3.9. In the ED he was given ASA 324 mg x 1 and started on heparin gtt. Currently, symptom-free.    Past Medical History:  Diagnosis Date  . Cervical spondylosis 10/01/2013  . Chronic diastolic CHF (congestive heart failure) ([place])    a. 07/2016 Echo: >55%.  . Coronary artery disease    a. 1998 s/p mini-cabg @ [facility] - pt reports one vessel bypass to LAD;  b. 07/2016 St Echo:  Inadequate HR (max 97) w/ hypertensive response (220/96). Ex time only 2:54 - stopped due to dyspnea and leg pain;  c.  08/2016 MV: EF 67%, no ischemia, low risk.  . Depression   . GERD (gastroesophageal reflux disease)   . Hyperlipidemia   . Hypertension   . PAF (paroxysmal atrial fibrillation) (HCC)    a. s/p DCCV-->maintaining sinus on amiodarone;  b. CHA2DS2VASc = 5-->eliquis.  . Parkinson's disease ([place])    tremors  . Pulmonary embolism (  [place]) 2011  . 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 Sleep apnea    wears CPAP      Most Recent Cardiac Studies: Lexiscan Myoview 08/18/2016: Study Result   Pharmacological myocardial perfusion imaging study with no significant  ischemia Normal wall motion, EF estimated at 67% No EKG changes concerning for ischemia at peak stress or in recovery. Low risk scan     Surgical History:  Past Surgical History:  Procedure Laterality Date  . BACK SURGERY  1960  . CHOLECYSTECTOMY  2010  . CORONARY ARTERY BYPASS GRAFT  01/07/1997    . ELECTROPHYSIOLOGIC STUDY N/A 07/14/2015   Procedure: CARDIOVERSION;  Surgeon: Yolonda Kida, MD;  Location: [facility] ORS;  Service: Cardiovascular;  Laterality: N/A;  . ELECTROPHYSIOLOGIC STUDY N/A 10/12/2015   Procedure: CARDIOVERSION;  Surgeon: Minna Merritts, MD;  Location: [facility] ORS;  Service: Cardiovascular;  Laterality: N/A;  . OTHER SURGICAL HISTORY  1998   Bypass     Home Meds: Prior to Admission medications   Medication Sig Start Date End Date Taking? Authorizing Provider  amiodarone (PACERONE) 200 MG tablet Take 200 mg by mouth daily.  07/30/15  Yes [provider]  apixaban (ELIQUIS) 5 MG TABS tablet Take 5 mg by mouth 2 (two) times daily. Reported on 07/31/2015   Yes [provider]  atorvastatin (LIPITOR) 40 MG tablet Take 1 tablet (40 mg total) by mouth daily. 10/17/16 01/15/17 Yes Rogelia Mire, NP  busPIRone (BUSPAR) 7.5 MG tablet Take 7.5 mg by mouth 2 (two) times daily.   Yes [provider]  carbidopa-levodopa (PARCOPA) 25-250 MG per disintegrating tablet Take 1 tablet by mouth 6 (six) times daily. Reported on 07/14/2015   Yes [provider]  esomeprazole (NEXIUM) 20 MG capsule Take 40 mg by mouth daily at 12 noon.   Yes [provider]  furosemide (LASIX) 40 MG tablet Take 1/2 tablet (20 mg) by mouth once every other day 01/12/16  Yes Deboraha Sprang, MD  levothyroxine (SYNTHROID, LEVOTHROID) 50 MCG tablet 50 mcg daily before breakfast.  09/07/16  Yes [provider]  mirabegron ER (MYRBETRIQ) 50 MG TB24 tablet Take 50 mg by mouth daily.   Yes [provider]  verapamil (CALAN-SR) 240 MG CR tablet Take 1 tablet (240 mg total) by mouth at bedtime. 10/17/16  Yes Rogelia Mire, NP  albuterol (VENTOLIN HFA) 108 (90 BASE) MCG/ACT inhaler Inhale 2 puffs into the lungs every 4 (four) hours as needed for wheezing.    [provider]  cyclobenzaprine (FLEXERIL) 5 MG tablet Take 5 mg by mouth daily.     [provider]  EPINEPHrine 0.3 mg/0.3 mL IJ SOAJ injection Inject 0.3 mg as directed once as needed (allergic reaction). Reported on 10/27/2015 07/01/13   [provider]  fluticasone Asencion Islam) 50 MCG/ACT nasal spray TAKE 2 PUFFS IN [facility] NOSTRIL EVERY DAY 10/18/16   Coral Spikes, DO    Inpatient Medications:     Allergies:  Allergies  Allergen Reactions  . Pravastatin Other (See Comments)  . Prednisone Other (See Comments)    Pt states that med makes him hyper Pt states that med makes him hyper    Social History   Social History  . Marital status: Married    Spouse name: [name]  . Number of children: 2  . Years of education: 12   Occupational History  . Retired     Designer, television/film set   Social History Main Topics  . Smoking status: Former  Smoker    Packs/day: 1.00    Years: 40.00    Types: Cigarettes, Pipe, Cigars    Quit date: 04/18/1972  . Smokeless tobacco: Former Systems developer    Types: Chew    Quit date: 04/18/1972  . Alcohol use 2.4 oz/week    2 Cans of beer, 2 Shots of liquor per week     Comment: per 2 weeks   . Drug use: No  . Sexual activity: Not Currently   Other Topics Concern  . Not on file   Social History Narrative   Lives w/ wife   Caffeine use: none   Right-handed     Family History  Problem Relation Age of Onset  . Family history unknown: Yes     Review of Systems: Review of Systems  Constitutional: Positive for diaphoresis and malaise/fatigue. Negative for chills, fever and weight loss.  HENT: Negative for congestion.   Eyes: Negative for discharge and redness.  Respiratory: Positive for shortness of breath. Negative for cough, hemoptysis, sputum production and wheezing.   Cardiovascular: Positive for chest pain. Negative for palpitations, orthopnea, claudication, leg swelling and PND.  Gastrointestinal: Positive for heartburn and nausea. Negative for abdominal pain, blood in stool, melena and vomiting.  Genitourinary:  Negative for hematuria.  Musculoskeletal: Negative for falls and myalgias.  Skin: Negative for rash.  Neurological: Positive for weakness. Negative for dizziness, tingling, tremors, sensory change, speech change, focal weakness and loss of consciousness.  Endo/Heme/Allergies: Does not bruise/bleed easily.  Psychiatric/Behavioral: Negative for substance abuse. The patient is not nervous/anxious.   All other systems reviewed and are negative.   Labs:  Recent Labs  10/28/16 0912  TROPONINI 0.12*   Lab Results  Component Value Date   WBC 9.7 10/28/2016   HGB 16.4 10/28/2016   HCT 48.9 10/28/2016   MCV 90.5 10/28/2016   PLT 114 (L) 10/28/2016     Recent Labs Lab 10/28/16 0912  NA 138  K 3.9  CL 103  CO2 26  BUN 20  CREATININE 1.14  CALCIUM 9.3  GLUCOSE 139*   Lab Results  Component Value Date   CHOL 173 10/27/2016   HDL 50.90 10/27/2016   LDLCALC 95 10/27/2016   TRIG 137.0 10/27/2016   No results found for: DDIMER  Radiology/Studies:  No results found.  EKG: Interpreted by me showed: NSR, 79 bpm, 1st degree AV block, LAFB, occasional PVCs, possible subendocardial injury Telemetry: Interpreted by me showed: NSR, 60s bpm  Weights: Filed Weights   10/28/16 0853  Weight: 260 lb (117.9 kg)     Physical Exam: Blood pressure (!) 155/96, pulse 74, resp. rate 18, height 5\' 6"  (1.676 m), weight 260 lb (117.9 kg), SpO2 95 %. Body mass index is 41.97 kg/m. General: Well developed, well nourished, in no acute distress. Head: Normocephalic, atraumatic, sclera non-icteric, no xanthomas, nares are without discharge.  Neck: Negative for carotid bruits. JVD difficult to assess 2/2 body habitus. Lungs: Clear bilaterally to auscultation without wheezes, rales, or rhonchi. Breathing is unlabored. Heart: RRR with S1 S2. No murmurs, rubs, or gallops appreciated. Abdomen: Obese, soft, non-tender, non-distended with normoactive bowel sounds. No hepatomegaly. No rebound/guarding.  No obvious abdominal masses. Msk:  Strength and tone appear normal for age. Extremities: No clubbing or cyanosis. No edema. Distal pedal pulses are 2+ and equal bilaterally. Neuro: Alert and oriented X 3. No facial asymmetry. No focal deficit. Moves all extremities spontaneously. Psych:  Responds to questions appropriately with a normal affect.    Assessment and  Plan:  Principal Problem:   NSTEMI (non-ST elevated myocardial infarction) ([place]) Active Problems:   PAF (paroxysmal atrial fibrillation) (HCC)   Coronary artery disease   (HFpEF) heart failure with preserved ejection fraction (HCC)   Parkinson's disease ([place])   Hyperlipidemia    1. NSTEMI/CAD s/p a-vessel CABG in 1998: -Has been noting progressive DOE for the past 6-12 months followed by onset of left-sided chest pain and left arm pain the past 3-4 days. Saw PCP on 7/12. Troponin noted to be elevated, EKG with nonspecific st/t changes. Advised this AM to come to the ED. -Chest pain free currently -Initial troponin on 7/12 noted to be 0.21. Follow up troponin in the ED this morning 0.12 -ASA 324 mg x 1 in the ED -Heparin gtt -Check TTE -Schedule LHC on 10/31/16 (will need 48 hours off Eliquis) -Check lipid and A1c -Continue Lipitor, ASA -Recommend adding Coreg in place of verapamil  -Risks and benefits of cardiac catheterization have been discussed with the patient including risks of bleeding, bruising, infection, kidney damage, stroke, heart attack, and death. The patient understands these risks and is willing to proceed with the procedure. All questions have been answered and concerns listened to  2. PAF: -Currently in NSR  -Eliquis on hold at time of admission -Heparin gtt -Will need 48 hours off Eliquis prior to [place] -Recommend adding Coreg as above in place of verapamil -Continue amiodarone  -CHADS2VASc at least 5 (CHF, HTN, age x 2, vascular disease)  3. HFpEF: -He continues to be mildly volume  overloaded -Continue Lasix -TTE pending  4. HTN: -Recommend Coreg as above, titrate as needed -Lasix  5. HLD: -Lipitor    Signed, Christell Faith, PA-C [facility] Pager: [phone] 10/28/2016, 11:53 AM

## 2016-10-28 NOTE — ED Notes (Signed)
Pt to ER because his doctor called and told him that he had abnormal labwork that was drawn yesterday. PT reports that he has been increasing with SOB upon exertion for last few weeks, recurrent episodes of chest pain that radiates down left upper arm. Pt denies pain at this time, last episode this AM. EDP at bedside. Pt face reddened, SOB visible upon exertion. Pt has tremor to left hand at baseline from Parkinsons.

## 2016-10-28 NOTE — ED Notes (Signed)
Verbal readback to Dr. Archie Balboa regarding pt troponin of 0.12, no further orders at this time.

## 2016-10-28 NOTE — Progress Notes (Addendum)
ANTICOAGULATION CONSULT NOTE - Initial Consult  Pharmacy Consult for Heparin Drip Indication: chest pain/ACS  Allergies  Allergen Reactions  . Pravastatin Other (See Comments)  . Prednisone Other (See Comments)    Pt states that med makes him hyper Pt states that med makes him hyper    Patient Measurements: Height: 5\' 6"  (167.6 cm) Weight: 260 lb (117.9 kg) IBW/kg (Calculated) : 63.8 Heparin Dosing Weight: 91.2 kg  Vital Signs: Temp: 97.9 F (36.6 C) (07/13 1500) Temp Source: Oral (07/13 1500) BP: 155/96 (07/13 1146) Pulse Rate: 74 (07/13 1146)  Labs:  Recent Labs  10/28/16 0912 10/28/16 1402 10/28/16 1901  HGB 16.4  --   --   HCT 48.9  --   --   PLT 114*  --   --   APTT  --  128* 72*  LABPROT  --  15.5*  --   INR  --  1.22  --   HEPARINUNFRC  --  3.26*  --   CREATININE 1.14  --   --   TROPONINI 0.12* 0.13*  --     Estimated Creatinine Clearance: 64.5 mL/min (by C-G formula based on SCr of 1.14 mg/dL).   Medical History: Past Medical History:  Diagnosis Date  . Cervical spondylosis 10/01/2013  . Chronic diastolic CHF (congestive heart failure) ([place])    a. 07/2016 Echo: >55%.  . Coronary artery disease    a. 1998 s/p mini-cabg @ [facility] - pt reports one vessel bypass to LAD;  b. 07/2016 St Echo:  Inadequate HR (max 97) w/ hypertensive response (220/96). Ex time only 2:54 - stopped due to dyspnea and leg pain;  c.  08/2016 MV: EF 67%, no ischemia, low risk.  . Depression   . GERD (gastroesophageal reflux disease)   . Hyperlipidemia   . Hypertension   . PAF (paroxysmal atrial fibrillation) (HCC)    a. s/p DCCV-->maintaining sinus on amiodarone;  b. CHA2DS2VASc = 5-->eliquis.  . Parkinson's disease ([place])    tremors  . Pulmonary embolism ([place]) 2011  . 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 Sleep apnea    wears CPAP    Medications:  Scheduled:  . amiodarone  200 mg Oral Daily  . [START ON 10/29/2016] aspirin EC  81 mg Oral Daily  . atorvastatin  40 mg Oral  Daily  . busPIRone  7.5 mg Oral BID  . carbidopa-levodopa  2 tablet Oral TID  . carvedilol  6.25 mg Oral BID WC  . fluticasone  1 spray Each Nare Daily  . furosemide  20 mg Oral Daily  . [START ON 10/29/2016] levothyroxine  50 mcg Oral QAC breakfast  . mirabegron ER  50 mg Oral Daily  . pantoprazole  40 mg Oral Daily  . sodium chloride flush  3 mL Intravenous Q12H   Infusions:  . sodium chloride    . [START ON 10/29/2016] sodium chloride    . heparin 1,150 Units/hr (10/28/16 1247)    Assessment: 81 yo Male with Elevated Troponin, chest discomfort. Patient w/ Hx of PE, Afib-on Apixaban 5 mg bid with last dose noted on Med Rec as being yesterday 10/27/16 at 1800.  Baseline: * Blood (Blue top) drawn in ED was hemolyzed- Lab to redraw.   APTT: 128 (drawn ~ 1 hour after Heparin bolus given and drip running- d/t blood drawn in ER hemolyzed and patient went to floor). Will recheck at 1900.  HL: 3.26 (Patient on Apixaban which can affect the level and was drawn ~1 hour after bolus/drip  started) INR: 1.22 PLT: 114,  Hgb 16.4  Goal of Therapy:  Heparin level 0.3-0.7 units/ml aPTT 66-102 seconds Monitor platelets by anticoagulation protocol: Yes   Plan:  7/13 1901  APTT therapeutic at 72. Will continue current infusion rate of heparin 1150u/hr and will recheck aPTT in 8 hours along with and Anti-Xa level.    Pernell Dupre, PharmD, BCPS Clinical Pharmacist 10/28/2016 7:34 PM    7/14 0330 aPTT 76, heparin level 1.70. Continue current regimen. Recheck aPTT, CBC, and heparin level with tomorrow AM labs.  Sim Boast, PharmD, BCPS  10/29/16 6:09 AM

## 2016-10-28 NOTE — ED Triage Notes (Signed)
Patient to ER per Dr. Lacinda Axon for elevated Troponin at office (drawn yesterday). Patient denies any current pain.

## 2016-10-28 NOTE — ED Notes (Signed)
Report to [place], Therapist, sports.

## 2016-10-28 NOTE — H&P (Signed)
[place] at [place] NAME: Andre [name]    MR#:  [id number]  DATE OF BIRTH:  [birth year]  DATE OF ADMISSION:  10/28/2016  PRIMARY CARE PHYSICIAN: Coral Spikes, DO   REQUESTING/REFERRING PHYSICIAN: Nance Pear, MD  CHIEF COMPLAINT:   Chief Complaint  Patient presents with  . Elevated Troponin   Chest pain with elevated troponin HISTORY OF PRESENT ILLNESS:  Andre [name]  is a 81 y.o. male with a known history of Hypertension, CAD, chronic diastolic CHF, A. fib, hyperlipidemia, pulmonary embolism and sleep apnea. The patient presently ED today due to elevated troponin and chest pain. The patient has had chest pain for the past the 3-4 days, which is in substernal area, discomfort, intermittent with radiation to left arm. He was found elevated troponin in doctor's office yesterday. He also complains of shortness breath for the past few months. The troponin today is 0.12.  PAST MEDICAL HISTORY:   Past Medical History:  Diagnosis Date  . Cervical spondylosis 10/01/2013  . Chronic diastolic CHF (congestive heart failure) ([place])    a. 07/2016 Echo: >55%.  . Coronary artery disease    a. 1998 s/p mini-cabg @ [facility] - pt reports one vessel bypass to LAD;  b. 07/2016 St Echo:  Inadequate HR (max 97) w/ hypertensive response (220/96). Ex time only 2:54 - stopped due to dyspnea and leg pain;  c.  08/2016 MV: EF 67%, no ischemia, low risk.  . Depression   . GERD (gastroesophageal reflux disease)   . Hyperlipidemia   . Hypertension   . PAF (paroxysmal atrial fibrillation) (HCC)    a. s/p DCCV-->maintaining sinus on amiodarone;  b. CHA2DS2VASc = 5-->eliquis.  . Parkinson's disease ([place])    tremors  . Pulmonary embolism ([place]) 2011  . 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 Sleep apnea    wears CPAP    PAST SURGICAL HISTORY:   Past Surgical History:  Procedure Laterality Date  . BACK SURGERY  1960  . CHOLECYSTECTOMY  2010  . CORONARY ARTERY  BYPASS GRAFT  01/07/1997  . ELECTROPHYSIOLOGIC STUDY N/A 07/14/2015   Procedure: CARDIOVERSION;  Surgeon: Yolonda Kida, MD;  Location: [facility] ORS;  Service: Cardiovascular;  Laterality: N/A;  . ELECTROPHYSIOLOGIC STUDY N/A 10/12/2015   Procedure: CARDIOVERSION;  Surgeon: Minna Merritts, MD;  Location: [facility] ORS;  Service: Cardiovascular;  Laterality: N/A;  . OTHER SURGICAL HISTORY  1998   Bypass    SOCIAL HISTORY:   Social History  Substance Use Topics  . Smoking status: Former Smoker    Packs/day: 1.00    Years: 40.00    Types: Cigarettes, Pipe, Cigars    Quit date: 04/18/1972  . Smokeless tobacco: Former Systems developer    Types: Chew    Quit date: 04/18/1972  . Alcohol use 2.4 oz/week    2 Cans of beer, 2 Shots of liquor per week     Comment: per 2 weeks     FAMILY HISTORY:   Family History  Problem Relation Age of Onset  . Family history unknown: Yes    DRUG ALLERGIES:   Allergies  Allergen Reactions  . Pravastatin Other (See Comments)  . Prednisone Other (See Comments)    Pt states that med makes him hyper Pt states that med makes him hyper    REVIEW OF SYSTEMS:   Review of Systems  Constitutional: Negative for chills, fever and malaise/fatigue.  HENT: Negative for sore throat.   Eyes: Negative for blurred vision  and double vision.  Respiratory: Negative for cough, shortness of breath, wheezing and stridor.   Cardiovascular: Positive for chest pain. Negative for palpitations and leg swelling.  Gastrointestinal: Negative for abdominal pain, blood in stool, diarrhea, melena, nausea and vomiting.  Genitourinary: Negative for dysuria and hematuria.  Musculoskeletal: Negative for back pain.  Skin: Negative for itching and rash.  Neurological: Negative for dizziness, focal weakness, loss of consciousness, weakness and headaches.  Psychiatric/Behavioral: Negative for depression. The patient is not nervous/anxious.     MEDICATIONS AT HOME:   Prior to Admission medications    Medication Sig Start Date End Date Taking? Authorizing Provider  amiodarone (PACERONE) 200 MG tablet Take 200 mg by mouth daily.  07/30/15  Yes [provider]  apixaban (ELIQUIS) 5 MG TABS tablet Take 5 mg by mouth 2 (two) times daily. Reported on 07/31/2015   Yes [provider]  atorvastatin (LIPITOR) 40 MG tablet Take 1 tablet (40 mg total) by mouth daily. 10/17/16 01/15/17 Yes Rogelia Mire, NP  busPIRone (BUSPAR) 7.5 MG tablet Take 7.5 mg by mouth 2 (two) times daily.   Yes [provider]  carbidopa-levodopa (PARCOPA) 25-250 MG per disintegrating tablet Take 1 tablet by mouth 6 (six) times daily. Reported on 07/14/2015   Yes [provider]  esomeprazole (NEXIUM) 20 MG capsule Take 40 mg by mouth daily at 12 noon.   Yes [provider]  furosemide (LASIX) 40 MG tablet Take 1/2 tablet (20 mg) by mouth once every other day 01/12/16  Yes Deboraha Sprang, MD  levothyroxine (SYNTHROID, LEVOTHROID) 50 MCG tablet 50 mcg daily before breakfast.  09/07/16  Yes [provider]  mirabegron ER (MYRBETRIQ) 50 MG TB24 tablet Take 50 mg by mouth daily.   Yes [provider]  verapamil (CALAN-SR) 240 MG CR tablet Take 1 tablet (240 mg total) by mouth at bedtime. 10/17/16  Yes Rogelia Mire, NP  albuterol (VENTOLIN HFA) 108 (90 BASE) MCG/ACT inhaler Inhale 2 puffs into the lungs every 4 (four) hours as needed for wheezing.    [provider]  cyclobenzaprine (FLEXERIL) 5 MG tablet Take 5 mg by mouth daily.    [provider]  EPINEPHrine 0.3 mg/0.3 mL IJ SOAJ injection Inject 0.3 mg as directed once as needed (allergic reaction). Reported on 10/27/2015 07/01/13   [provider]  fluticasone (FLONASE) 50 MCG/ACT nasal spray TAKE 2 PUFFS IN EACH NOSTRIL EVERY DAY 10/18/16   Coral Spikes, DO      VITAL SIGNS:  Blood pressure (!) 155/96, pulse 74, resp. rate 18, height 5\' 6"  (1.676 m), weight 260 lb (117.9 kg), SpO2  95 %.  PHYSICAL EXAMINATION:  Physical Exam  GENERAL:  81 y.o.-year-old patient lying in the bed with no acute distress. Morbidly obese. EYES: Pupils equal, round, reactive to light and accommodation. No scleral icterus. Extraocular muscles intact.  HEENT: Head atraumatic, normocephalic. Oropharynx and nasopharynx clear.  NECK:  Supple, no jugular venous distention. No thyroid enlargement, no tenderness.  LUNGS: Normal breath sounds bilaterally, no wheezing, rales,rhonchi or crepitation. No use of accessory muscles of respiration.  CARDIOVASCULAR: S1, S2 normal. No murmurs, rubs, or gallops.  ABDOMEN: Soft, nontender, nondistended. Bowel sounds present. No organomegaly or mass.  EXTREMITIES: No pedal edema, cyanosis, or clubbing.  NEUROLOGIC: Cranial nerves II through XII are intact. Muscle strength 5/5 in all extremities. Sensation intact. Gait not checked.  PSYCHIATRIC: The patient is alert and oriented x 3.  SKIN: No obvious rash, lesion,  or ulcer.   LABORATORY PANEL:   CBC  Recent Labs Lab 10/28/16 0912  WBC 9.7  HGB 16.4  HCT 48.9  PLT 114*   ------------------------------------------------------------------------------------------------------------------  Chemistries   Recent Labs Lab 10/28/16 0912  NA 138  K 3.9  CL 103  CO2 26  GLUCOSE 139*  BUN 20  CREATININE 1.14  CALCIUM 9.3   ------------------------------------------------------------------------------------------------------------------  Cardiac Enzymes  Recent Labs Lab 10/28/16 0912  TROPONINI 0.12*   ------------------------------------------------------------------------------------------------------------------  RADIOLOGY:  No results found.    IMPRESSION AND PLAN:   Non-STEMI. The patient will be admitted to telemetry floor. Follow-up troponin level and lipid panel, continue aspirin and heparin drip, start Coreg, Lipitor, nitroglycerin and morphine when necessary, cardiology consult  for possible cardiac catheter.  History of chronic A. fib. Hold Eliquis, continue heparin drip and amiodarone.  Hypertension. Continue home hypertension medication.  Chronic diastolic CHF. Stable. Continue Lasix  All the records are reviewed and case discussed with ED provider. Management plans discussed with the patient, his wife and they are in agreement.  CODE STATUS: Full code TOTAL TIME TAKING CARE OF THIS PATIENT: 58 minutes.    Demetrios Loll M.D on 10/28/2016 at 12:55 PM  Between 7am to 6pm - Pager - [phone]  After 6pm go to [email or web address] - Proofreader  [facility]  Office  [phone]  CC: Primary care physician; Coral Spikes, DO   Note: This dictation was prepared with Dragon dictation along with smaller phrase technology. Any transcriptional errors that result from this process are unintentional.

## 2016-10-29 ENCOUNTER — Inpatient Hospital Stay (HOSPITAL_COMMUNITY)
Admit: 2016-10-29 | Discharge: 2016-10-29 | Disposition: A | Discharge status: Home / Self Care | Payer: PPO | Attending: Physician Assistant | Admitting: Physician Assistant

## 2016-10-29 DIAGNOSIS — I10 Essential (primary) hypertension: Secondary | ICD-10-CM

## 2016-10-29 DIAGNOSIS — I480 Paroxysmal atrial fibrillation: Secondary | ICD-10-CM

## 2016-10-29 DIAGNOSIS — I214 Non-ST elevation (NSTEMI) myocardial infarction: Secondary | ICD-10-CM

## 2016-10-29 LAB — ECHOCARDIOGRAM COMPLETE
Height: 66 in
Weight: 4160 oz

## 2016-10-29 LAB — CBC
HCT: 49.6 % (ref 40.0–52.0)
Hemoglobin: 16.4 g/dL (ref 13.0–18.0)
MCH: 30.3 pg (ref 26.0–34.0)
MCHC: 33 g/dL (ref 32.0–36.0)
MCV: 91.9 fL (ref 80.0–100.0)
Platelets: 128 10*3/uL — ABNORMAL LOW (ref 150–440)
RBC: 5.4 MIL/uL (ref 4.40–5.90)
RDW: 15.2 % — ABNORMAL HIGH (ref 11.5–14.5)
WBC: 12.1 10*3/uL — ABNORMAL HIGH (ref 3.8–10.6)

## 2016-10-29 LAB — HEPARIN LEVEL (UNFRACTIONATED): Heparin Unfractionated: 1.7 IU/mL — ABNORMAL HIGH (ref 0.30–0.70)

## 2016-10-29 LAB — LIPID PANEL
Cholesterol: 183 mg/dL (ref 0–200)
HDL: 45 mg/dL (ref >40)
LDL Cholesterol: 92 mg/dL (ref 0–99)
Total CHOL/HDL Ratio: 4.1 RATIO
Triglycerides: 229 mg/dL — ABNORMAL HIGH (ref <150)
VLDL: 46 mg/dL — ABNORMAL HIGH (ref 0–40)

## 2016-10-29 LAB — APTT: aPTT: 76 seconds — ABNORMAL HIGH (ref 24–36)

## 2016-10-29 NOTE — Progress Notes (Signed)
Progress Note  Patient Name: Andre [name] Date of Encounter: 10/29/2016  Primary Cardiologist: Dr. Caryl Comes  Patient Profile     81 y.o. male with history of CAD-CABG 1 (LIMA-LAD in 1998 at [facility]),HFpEF, PAF s/p DCCV on Eliquis, hypertensive heart disease, PE, Parkinson's disease, and morbid obesity who was admitted to [facility] with ACS/mild non-STEMI (borderline troponin elevation) on July 13 based on labs drawn on July 12. Was seen by PCP on July 12 with complaints Of chest pain and left arm pain over the preceding 3 evenings. Symptoms were associated with some dyspnea. Lasted roughly 2 hours. WITH aspirin. Office EKG revealed sinus rhythm with a rate of 60 BPM. Left axis deviation/LAFB with poor R-wave progression. Troponin drawn in the office was 0.21.  Principal Problem:   NSTEMI (non-ST elevated myocardial infarction) ([place]) Active Problems:   PAF (paroxysmal atrial fibrillation) ([place])   Coronary artery disease involving native coronary artery of native heart with angina pectoris ([place])   Parkinson's disease ([place])   Hyperlipidemia with target LDL less than 70   Essential hypertension   (HFpEF) heart failure with preserved ejection fraction (HCC)   Subjective   No further chest pain. No sensation of palpitations.  Inpatient Medications    Scheduled Meds: . amiodarone  200 mg Oral Daily  . aspirin EC  81 mg Oral Daily  . atorvastatin  40 mg Oral Daily  . busPIRone  7.5 mg Oral BID  . carbidopa-levodopa  2 tablet Oral TID  . carvedilol  6.25 mg Oral BID WC  . fluticasone  1 spray Each Nare Daily  . furosemide  20 mg Oral Daily  . levothyroxine  50 mcg Oral QAC breakfast  . mirabegron ER  50 mg Oral Daily  . pantoprazole  40 mg Oral Daily  . sodium chloride flush  3 mL Intravenous Q12H   Continuous Infusions: . sodium chloride    . sodium chloride 10 mL/hr at 10/29/16 0600  . heparin 1,150 Units/hr (10/29/16 0544)   PRN Meds: sodium chloride, acetaminophen, albuterol,  ALPRAZolam, nitroGLYCERIN, ondansetron (ZOFRAN) IV, sodium chloride flush, zolpidem   Vital Signs    Vitals:   10/28/16 1500 10/28/16 2026 10/29/16 0602 10/29/16 0841  BP:  115/67 123/61 (!) 168/81  Pulse:  64 69 66  Resp:  16 16 18   Temp: 97.9 F (36.6 C) 97.7 F (36.5 C) 97.7 F (36.5 C) 98.1 F (36.7 C)  TempSrc: Oral Oral Oral Oral  SpO2:  96% 97% 96%  Weight:      Height: 5\' 6"  (1.676 m)       Intake/Output Summary (Last 24 hours) at 10/29/16 2025 Last data filed at 10/29/16 4270  Gross per 24 hour  Intake           516.99 ml  Output                0 ml  Net           516.99 ml   Filed Weights   10/28/16 0853  Weight: 260 lb (117.9 kg)    Telemetry    NSR with PVCs, 60s-70s - Personally Reviewed  ECG    No new EKG today  Physical Exam   General appearance: alert, cooperative, appears stated age, no distress, morbidly obese and Pleasant mood and affect. Neck: no adenopathy, no carotid bruit, no JVD, supple, symmetrical, trachea midline and No carotid bruit Lungs: clear to auscultation bilaterally, normal percussion bilaterally and Nonlabored, good air movement Heart:  regular rate and rhythm, S1, S2 normal, no murmur, click, rub or gallop and normal apical impulse Abdomen: soft, non-tender; bowel sounds normal; no masses,  no organomegaly and Obese and unable to assess HSM or HJR Extremities: extremities normal, atraumatic, no cyanosis or edema Pulses: 2+ and symmetric Neurologic: Grossly normal; A&O x 3  Labs    Chemistry  Recent Labs Lab 10/28/16 0912  NA 138  K 3.9  CL 103  CO2 26  GLUCOSE 139*  BUN 20  CREATININE 1.14  CALCIUM 9.3  GFRNONAA 60*  GFRAA >60  ANIONGAP 9     Hematology  Recent Labs Lab 10/28/16 0912 10/29/16 0336  WBC 9.7 12.1*  RBC 5.40 5.40  HGB 16.4 16.4  HCT 48.9 49.6  MCV 90.5 91.9  MCH 30.3 30.3  MCHC 33.5 33.0  RDW 15.1* 15.2*  PLT 114* 128*    Cardiac Enzymes  Recent Labs Lab 10/28/16 0912  10/28/16 1402 10/28/16 1901  TROPONINI 0.12* 0.13* 0.11*   No results for input(s): TROPIPOC in the last 168 hours.   BNPNo results for input(s): BNP, PROBNP in the last 168 hours.   DDimer No results for input(s): DDIMER in the last 168 hours.   Radiology    No results found.  Cardiac Studies   Echo ordered, still pending.  Most Recent Cardiac Studies: Lexiscan Myoview 08/18/2016: Pharmacological myocardial perfusion imaging study with no significant ischemia Normal wall motion, EF estimated at 67% No EKG changes concerning for ischemia at peak stress or in recovery. Low risk scan      Assessment & Plan    Principal Problem:   NSTEMI (non-ST elevated myocardial infarction) (HCC)/Coronary artery disease involving native coronary artery of native heart with angina pectoris ([place]) Symptoms concerning for progressive angina and now with mild troponin elevation consistent with possible ACS/non-STEMI. Recent Myoview with no evidence of ischemia, however now presenting with concerning symptoms - the only reasonable evaluation at this point would be cardiac catheterization.  Currently waiting ELIQUIS washout with planned cardiac catheterization on Monday, July 16 (Dr. Fletcher Anon)  On IV heparin infusion  When necessary nitroglycerin (not currently having chest pain)  Active Problems:   PAF (paroxysmal atrial fibrillation) (HCC) - Maintaining sinus rhythm on chronic amiodarone.  With non-STEMI presentation, converted from verapamil to carvedilol  Has been on ELIQUIS for anticoagulation, currently on hold for catheterization. This patients CHA2DS2-VASc Score and unadjusted Ischemic Stroke Rate (% per year) is equal to 7.2 % stroke rate/year from a score of 5 Above score calculated as 1 point each if present [CHF, HTN, DM, Vascular=MI/PAD/Aortic Plaque, Age if 65-74, or Male] Above score calculated as 2 points each if present [Age > 75, or Stroke/TIA/TE]    Parkinson's disease  ([place]) - per IM   Hyperlipidemia with target LDL less than 70 - atorvastatin 40 mg.    Essential hypertension - intermittent control. Currently elevated, but has been low borderline low.  Was just converted to carvedilol yesterday, would monitor for today.  May need to consider titrating up carvedilol versus adding afterload reduction with ARB/ACE inhibitor (however with impending cardiac apposition, would wait until post cath)    (HFpEF) heart failure with preserved ejection fraction ([place]) - relatively euvolemic  Continue oral Lasix  Converted to carvedilol    Signed, Glenetta Hew, MD  10/29/2016, 9:52 AM

## 2016-10-29 NOTE — Progress Notes (Signed)
[place] at [place] NAME: Andre [name]    MR#:  [id number]  DATE OF BIRTH:  10/31/1935  SUBJECTIVE:  CHIEF COMPLAINT:   Chief Complaint  Patient presents with  . Elevated Troponin   Still intermittent chest discomfort and shortness of breath. REVIEW OF SYSTEMS:  Review of Systems  Constitutional: Positive for malaise/fatigue. Negative for chills and fever.  HENT: Negative for sore throat.   Eyes: Negative for blurred vision and double vision.  Respiratory: Positive for shortness of breath. Negative for cough, hemoptysis, wheezing and stridor.   Cardiovascular: Positive for chest pain. Negative for palpitations and leg swelling.  Gastrointestinal: Negative for abdominal pain, blood in stool, diarrhea, melena, nausea and vomiting.  Genitourinary: Negative for dysuria and hematuria.  Musculoskeletal: Negative for back pain.  Neurological: Negative for dizziness, focal weakness, loss of consciousness and headaches.  Psychiatric/Behavioral: Negative for depression. The patient is not nervous/anxious.     DRUG ALLERGIES:   Allergies  Allergen Reactions  . Pravastatin Other (See Comments)  . Prednisone Other (See Comments)    Pt states that med makes him hyper Pt states that med makes him hyper   VITALS:  Blood pressure (!) 168/81, pulse 66, temperature 98.1 F (36.7 C), temperature source Oral, resp. rate 18, height 5\' 6"  (1.676 m), weight 260 lb (117.9 kg), SpO2 96 %. PHYSICAL EXAMINATION:  Physical Exam  Constitutional: He is oriented to person, place, and time. No distress.  Morbid Obese.  HENT:  Head: Normocephalic.  Mouth/Throat: Oropharynx is clear and moist.  Eyes: Conjunctivae and EOM are normal. No scleral icterus.  Neck: Normal range of motion. Neck supple. No JVD present. No tracheal deviation present.  Cardiovascular: Normal rate, regular rhythm and normal heart sounds.  Exam reveals no gallop.   No murmur  heard. Pulmonary/Chest: Effort normal and breath sounds normal. No respiratory distress. He has no wheezes. He has no rales.  Abdominal: Soft. Bowel sounds are normal. He exhibits no distension. There is no tenderness.  Musculoskeletal: Normal range of motion. He exhibits no edema or tenderness.  Neurological: He is alert and oriented to person, place, and time. No cranial nerve deficit.  Skin: No rash noted. No erythema.  Psychiatric: Affect normal.   LABORATORY PANEL:  Male CBC  Recent Labs Lab 10/29/16 0336  WBC 12.1*  HGB 16.4  HCT 49.6  PLT 128*   ------------------------------------------------------------------------------------------------------------------ Chemistries   Recent Labs Lab 10/28/16 0912  NA 138  K 3.9  CL 103  CO2 26  GLUCOSE 139*  BUN 20  CREATININE 1.14  CALCIUM 9.3   RADIOLOGY:  No results found. ASSESSMENT AND PLAN:   NSTEMI/CAD  Continue Heparin drip,  Aspirin, Coreg, Lipitor, nitroglycerin and morphine when necessary,  Hold Eliquis 48 hours for cardiac catheter.  History of PAF. Hold Eliquis, continue heparin drip and amiodarone.  Hypertension. Continue home hypertension medication.  Chronic diastolic CHF. Stable. Continue Lasix Obesity and OSA. CPAP at night. Thrombocytopenia. Follow-up CBC while on heparin drip  All the records are reviewed and case discussed with Care Management/Social Worker. Management plans discussed with the patient, family and they are in agreement.  CODE STATUS: Full Code  TOTAL TIME TAKING CARE OF THIS PATIENT: 37 minutes.   More than 50% of the time was spent in counseling/coordination of care: YES  POSSIBLE D/C IN 3 DAYS, DEPENDING ON CLINICAL CONDITION.   Demetrios Loll M.D on 10/29/2016 at 1:43 PM  Between 7am to 6pm -  Pager - [phone]  After 6pm go to [email or web address] - Proofreader  [facility] [place]  Office  [phone]  CC: Primary care physician; Coral Spikes, DO  Note: This dictation was prepared with Dragon dictation along with smaller phrase technology. Any transcriptional errors that result from this process are unintentional.

## 2016-10-29 NOTE — Progress Notes (Signed)
*  PRELIMINARY RESULTS* Echocardiogram 2D Echocardiogram has been performed.  Andre [name] [name] 10/29/2016, 3:39 PM 

## 2016-10-30 LAB — CBC
HCT: 46.8 % (ref 40.0–52.0)
Hemoglobin: 15.4 g/dL (ref 13.0–18.0)
MCH: 30.4 pg (ref 26.0–34.0)
MCHC: 32.8 g/dL (ref 32.0–36.0)
MCV: 92.5 fL (ref 80.0–100.0)
Platelets: 96 10*3/uL — ABNORMAL LOW (ref 150–440)
RBC: 5.06 MIL/uL (ref 4.40–5.90)
RDW: 15.1 % — ABNORMAL HIGH (ref 11.5–14.5)
WBC: 8.5 10*3/uL (ref 3.8–10.6)

## 2016-10-30 LAB — APTT: aPTT: 71 seconds — ABNORMAL HIGH (ref 24–36)

## 2016-10-30 LAB — HEPARIN LEVEL (UNFRACTIONATED): Heparin Unfractionated: 0.87 IU/mL — ABNORMAL HIGH (ref 0.30–0.70)

## 2016-10-30 MED ORDER — ISOSORBIDE MONONITRATE ER 30 MG PO TB24
30.0000 mg | ORAL_TABLET | Freq: Every day | ORAL | Status: DC
  Administered 2016-10-30 – 2016-11-02 (×4): 30 mg via ORAL
  Filled 2016-10-30 (×4): qty 1

## 2016-10-30 NOTE — Progress Notes (Signed)
Patient needs a second IV for the cath in the morning but refused. Patient stated to  the RN, "I'll  let you try the IV stick for one time, if you  miss it I will  not let you stick me again." Patient later refused altogether stated, "I'm trying to get some rest.". Patient is very anxious about the procedure tomorrow. Will try again in the morning.

## 2016-10-30 NOTE — Progress Notes (Signed)
Progress Note  Patient Name: Andre [name] Date of Encounter: 10/30/2016  Primary Cardiologist: Dr. Caryl Comes  Patient Profile     81 y.o. male with history of CAD-CABG 1 (LIMA-LAD in 1998 at [facility]),HFpEF, PAF s/p DCCV on Eliquis, hypertensive heart disease, PE, Parkinson's disease, and morbid obesity who was admitted to [facility] with ACS/mild non-STEMI (borderline troponin elevation) on July 13 based on labs drawn on July 12. Was seen by PCP on July 12 with complaints Of chest pain and left arm pain over the preceding 3 evenings. Symptoms were associated with some dyspnea. Lasted roughly 2 hours. WITH aspirin. Office EKG revealed sinus rhythm with a rate of 60 BPM. Left axis deviation/LAFB with poor R-wave progression. Troponin drawn in the office was 0.21.  Principal Problem:   NSTEMI (non-ST elevated myocardial infarction) ([place]) Active Problems:   PAF (paroxysmal atrial fibrillation) ([place])   Coronary artery disease involving native coronary artery of native heart with angina pectoris ([place])   Parkinson's disease ([place])   Hyperlipidemia with target LDL less than 70   Essential hypertension   (HFpEF) heart failure with preserved ejection fraction ([place])   Subjective   He woke up this morning very anxious and also panic attack. Follow-up his upcoming procedure. He did have some chest pain associated with that. Resolved with antiemetic and nitroglycerin. No longer having chest pain.  Inpatient Medications    Scheduled Meds: . amiodarone  200 mg Oral Daily  . aspirin EC  81 mg Oral Daily  . atorvastatin  40 mg Oral Daily  . busPIRone  7.5 mg Oral BID  . carbidopa-levodopa  2 tablet Oral TID  . carvedilol  6.25 mg Oral BID WC  . fluticasone  1 spray Each Nare Daily  . furosemide  20 mg Oral Daily  . isosorbide mononitrate  30 mg Oral Daily  . levothyroxine  50 mcg Oral QAC breakfast  . mirabegron ER  50 mg Oral Daily  . pantoprazole  40 mg Oral Daily  . sodium chloride flush  3 mL  Intravenous Q12H   Continuous Infusions: . sodium chloride    . sodium chloride 10 mL/hr at 10/29/16 0600  . heparin 1,150 Units/hr (10/30/16 0400)   PRN Meds: sodium chloride, acetaminophen, albuterol, ALPRAZolam, nitroGLYCERIN, ondansetron (ZOFRAN) IV, sodium chloride flush, zolpidem   Vital Signs    Vitals:   10/30/16 0414 10/30/16 0657 10/30/16 0703 10/30/16 0807  BP: 139/80 (!) 156/74 116/67 131/75  Pulse: (!) 58 68 (!) 57 66  Resp: 16   18  Temp: (!) 97.4 F (36.3 C)   98.1 F (36.7 C)  TempSrc: Oral   Oral  SpO2: 95%   94%  Weight:      Height:        Intake/Output Summary (Last 24 hours) at 10/30/16 1429 Last data filed at 10/30/16 0900  Gross per 24 hour  Intake              659 ml  Output                0 ml  Net              659 ml   Filed Weights   10/28/16 0853  Weight: 260 lb (117.9 kg)    Telemetry    Maintaining sinus rhythm. Occasional PVCs. Rates 60s-80s - Personally Reviewed  ECG    No new EKG today  Physical Exam   General appearance: alert, cooperative, appears stated age, no  distress, morbidly obese and Very pleasant mood and affect. Very talkative Neck: no carotid bruit and no JVD Lungs: Nonlabored, good air movement. Distant breath sounds due to body habitus. Otherwise CTAB Heart: RRR, normal S1 and S2. No obvious M/R/G. Nondisplaced PMI. (Difficult to palpate) Abdomen: soft, non-tender; bowel sounds normal; no masses,  no organomegaly and Unable to assess HSM or HJR due to obesity Extremities: extremities normal, atraumatic, no cyanosis or edema Pulses: 2+ and symmetric Neurologic: Mental status: Alert, oriented, thought content appropriate; nonfocal. Minimal tremor.  Labs    Chemistry  Recent Labs Lab 10/28/16 0912  NA 138  K 3.9  CL 103  CO2 26  GLUCOSE 139*  BUN 20  CREATININE 1.14  CALCIUM 9.3  GFRNONAA 60*  GFRAA >60  ANIONGAP 9     Hematology  Recent Labs Lab 10/28/16 0912 10/29/16 0336 10/30/16 0511    WBC 9.7 12.1* 8.5  RBC 5.40 5.40 5.06  HGB 16.4 16.4 15.4  HCT 48.9 49.6 46.8  MCV 90.5 91.9 92.5  MCH 30.3 30.3 30.4  MCHC 33.5 33.0 32.8  RDW 15.1* 15.2* 15.1*  PLT 114* 128* 96*    Cardiac Enzymes  Recent Labs Lab 10/28/16 0912 10/28/16 1402 10/28/16 1901  TROPONINI 0.12* 0.13* 0.11*   No results for input(s): TROPIPOC in the last 168 hours.   BNPNo results for input(s): BNP, PROBNP in the last 168 hours.   DDimer No results for input(s): DDIMER in the last 168 hours.   Radiology    No results found.  Cardiac Studies   Echo ordered, still pending.  Most Recent Cardiac Studies: Lexiscan Myoview 08/18/2016: Pharmacological myocardial perfusion imaging study with no significant ischemia Normal wall motion, EF estimated at 67% No EKG changes concerning for ischemia at peak stress or in recovery. Low risk scan      Assessment & Plan    Principal Problem:   NSTEMI (non-ST elevated myocardial infarction) (HCC)/Coronary artery disease involving native coronary artery of native heart with angina pectoris ([place]) Symptoms concerning for progressive angina and now with mild troponin elevation consistent with possible ACS/non-STEMI. Recent Myoview with no evidence of ischemia, however now presenting with concerning symptoms - the only reasonable evaluation at this point would be cardiac catheterization.  Currently waiting ELIQUIS washout with planned cardiac catheterization on Monday, July 16 (Dr. Fletcher Anon)  On IV heparin infusion And when necessary nitroglycerin  With the one episode of chest pain this morning, I'm adding Imdur Plan: Cardiac catheterization tomorrow, first case  Active Problems:   PAF (paroxysmal atrial fibrillation) (HCC) - maintaining sinus rhythm on chronic amiodarone.  Given diagnosis of non-STEMI, converted from verapamil to carvedilol. Tolerating well.  ELIQUIS on hold for upcoming cardiac cath. On IV heparin This patients CHA2DS2-VASc Score  and unadjusted Ischemic Stroke Rate (% per year) is equal to 7.2 % stroke rate/year from a score of 5 Above score calculated as 1 point each if present [CHF, HTN, DM, Vascular=MI/PAD/Aortic Plaque, Age if 65-74, or Male] Above score calculated as 2 points each if present [Age > 75, or Stroke/TIA/TE]  Restart ELIQUIS post cath    Parkinson's disease ([place]) - per IM   Hyperlipidemia with target LDL less than 70 - on statin    Essential hypertension - better controlled.   No change to current medications.    (HFpEF) heart failure with preserved ejection fraction (HCC) - continues to be euvolemic on oral Lasix.  Verapamil Converted to carvedilol  Monitor for blood pressure change or increase,  may want to consider ARB for afterload reduction.    Signed, Glenetta Hew, MD  10/30/2016, 2:29 PM

## 2016-10-30 NOTE — Progress Notes (Signed)
ANTICOAGULATION CONSULT NOTE - Initial Consult  Pharmacy Consult for Heparin Drip Indication: chest pain/ACS  Allergies  Allergen Reactions  . Pravastatin Other (See Comments)  . Prednisone Other (See Comments)    Pt states that med makes him hyper Pt states that med makes him hyper    Patient Measurements: Height: 5\' 6"  (167.6 cm) Weight: 260 lb (117.9 kg) IBW/kg (Calculated) : 63.8 Heparin Dosing Weight: 91.2 kg  Vital Signs: Temp: 97.4 F (36.3 C) (07/15 0414) Temp Source: Oral (07/15 0414) BP: 139/80 (07/15 0414) Pulse Rate: 58 (07/15 0414)  Labs:  Recent Labs  10/28/16 0912  10/28/16 1402 10/28/16 1901 10/29/16 0336 10/30/16 0511  HGB 16.4  --   --   --  16.4  --   HCT 48.9  --   --   --  49.6  --   PLT 114*  --   --   --  128*  --   APTT  --   < > 128* 72* 76* 71*  LABPROT  --   --  15.5*  --   --   --   INR  --   --  1.22  --   --   --   HEPARINUNFRC  --   --  3.26*  --  1.70* 0.87*  CREATININE 1.14  --   --   --   --   --   TROPONINI 0.12*  --  0.13* 0.11*  --   --   < > = values in this interval not displayed.  Estimated Creatinine Clearance: 64.5 mL/min (by C-G formula based on SCr of 1.14 mg/dL).   Medical History: Past Medical History:  Diagnosis Date  . Cervical spondylosis 10/01/2013  . Chronic diastolic CHF (congestive heart failure) ([place])    a. 07/2016 Echo: >55%.  . Coronary artery disease    a. 1998 s/p mini-cabg @ [facility] - pt reports one vessel bypass to LAD;  b. 07/2016 St Echo:  Inadequate HR (max 97) w/ hypertensive response (220/96). Ex time only 2:54 - stopped due to dyspnea and leg pain;  c.  08/2016 MV: EF 67%, no ischemia, low risk.  . Depression   . GERD (gastroesophageal reflux disease)   . Hyperlipidemia   . Hypertension   . PAF (paroxysmal atrial fibrillation) (HCC)    a. s/p DCCV-->maintaining sinus on amiodarone;  b. CHA2DS2VASc = 5-->eliquis.  . Parkinson's disease ([place])    tremors  . Pulmonary embolism ([place]) 2011  .  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 Sleep apnea    wears CPAP    Medications:  Scheduled:  . amiodarone  200 mg Oral Daily  . aspirin EC  81 mg Oral Daily  . atorvastatin  40 mg Oral Daily  . busPIRone  7.5 mg Oral BID  . carbidopa-levodopa  2 tablet Oral TID  . carvedilol  6.25 mg Oral BID WC  . fluticasone  1 spray Each Nare Daily  . furosemide  20 mg Oral Daily  . levothyroxine  50 mcg Oral QAC breakfast  . mirabegron ER  50 mg Oral Daily  . pantoprazole  40 mg Oral Daily  . sodium chloride flush  3 mL Intravenous Q12H   Infusions:  . sodium chloride    . sodium chloride 10 mL/hr at 10/29/16 0600  . heparin 1,150 Units/hr (10/30/16 0400)    Assessment: 81 yo Male with Elevated Troponin, chest discomfort. Patient w/ Hx of PE, Afib-on Apixaban 5 mg bid with last dose  noted on Med Rec as being yesterday 10/27/16 at 1800.  Baseline: * Blood (Blue top) drawn in ED was hemolyzed- Lab to redraw.   APTT: 128 (drawn ~ 1 hour after Heparin bolus given and drip running- d/t blood drawn in ER hemolyzed and patient went to floor). Will recheck at 1900.  HL: 3.26 (Patient on Apixaban which can affect the level and was drawn ~1 hour after bolus/drip started) INR: 1.22 PLT: 114,  Hgb 16.4  Goal of Therapy:  Heparin level 0.3-0.7 units/ml aPTT 66-102 seconds Monitor platelets by anticoagulation protocol: Yes   Plan:  7/13 1901  APTT therapeutic at 72. Will continue current infusion rate of heparin 1150u/hr and will recheck aPTT in 8 hours along with and Anti-Xa level.    Eloise Harman, PharmD, BCPS Clinical Pharmacist 10/30/2016 6:22 AM    7/14 0330 aPTT 76, heparin level 1.70. Continue current regimen. Recheck aPTT, CBC, and heparin level with tomorrow AM labs.  7/15 AM aPTT 71, heparin level 0.87. Continue current regimen. Recheck aPTT, heparin level, and CBC with tomorrow AM labs.  Sim Boast, PharmD, BCPS  10/30/16 6:22 AM

## 2016-10-30 NOTE — Progress Notes (Signed)
[place] [facility] [place] NAME: Andre [name]    MR#:  [id number]  DATE OF BIRTH:  1935/09/20  SUBJECTIVE:  CHIEF COMPLAINT:   Chief Complaint  Patient presents with  . Elevated Troponin   One Episode of chest pain his morning. Left forearm numbness. REVIEW OF SYSTEMS:  Review of Systems  Constitutional: Negative for chills, fever and malaise/fatigue.  HENT: Negative for sore throat.   Eyes: Negative for blurred vision and double vision.  Respiratory: Negative for cough, hemoptysis, shortness of breath, wheezing and stridor.   Cardiovascular: Positive for chest pain. Negative for palpitations and leg swelling.  Gastrointestinal: Negative for abdominal pain, blood in stool, diarrhea, melena, nausea and vomiting.  Genitourinary: Negative for dysuria and hematuria.  Musculoskeletal: Negative for back pain.  Neurological: Positive for sensory change. Negative for dizziness, focal weakness, loss of consciousness and headaches.  Psychiatric/Behavioral: Negative for depression. The patient is nervous/anxious.     DRUG ALLERGIES:   Allergies  Allergen Reactions  . Pravastatin Other (See Comments)  . Prednisone Other (See Comments)    Pt states that med makes him hyper Pt states that med makes him hyper   VITALS:  Blood pressure 131/75, pulse 66, temperature 98.1 F (36.7 C), temperature source Oral, resp. rate 18, height 5\' 6"  (1.676 m), weight 260 lb (117.9 kg), SpO2 94 %. PHYSICAL EXAMINATION:  Physical Exam  Constitutional: He is oriented to person, place, and time. No distress.  Morbid Obese.  HENT:  Head: Normocephalic.  Mouth/Throat: Oropharynx is clear and moist.  Eyes: Conjunctivae and EOM are normal. No scleral icterus.  Neck: Normal range of motion. Neck supple. No JVD present. No tracheal deviation present.  Cardiovascular: Normal rate, regular rhythm and normal heart sounds.  Exam reveals no gallop.   No murmur  heard. Pulmonary/Chest: Effort normal and breath sounds normal. No respiratory distress. He has no wheezes. He has no rales.  Abdominal: Soft. Bowel sounds are normal. He exhibits no distension. There is no tenderness.  Musculoskeletal: Normal range of motion. He exhibits no edema or tenderness.  Neurological: He is alert and oriented to person, place, and time. No cranial nerve deficit.  Skin: No rash noted. No erythema.  Psychiatric: Affect normal.   LABORATORY PANEL:  Male CBC  Recent Labs Lab 10/30/16 0511  WBC 8.5  HGB 15.4  HCT 46.8  PLT 96*   ------------------------------------------------------------------------------------------------------------------ Chemistries   Recent Labs Lab 10/28/16 0912  NA 138  K 3.9  CL 103  CO2 26  GLUCOSE 139*  BUN 20  CREATININE 1.14  CALCIUM 9.3   RADIOLOGY:  No results found. ASSESSMENT AND PLAN:   NSTEMI/CAD  Continue Heparin drip,  Aspirin, Coreg, Lipitor, nitroglycerin and morphine when necessary,  Hold Eliquis 48 hours, cardiac catheter tomorrow.  History of PAF. Hold Eliquis, continue heparin drip and amiodarone.  Hypertension. Continue home hypertension medication.  Chronic diastolic CHF. Stable. Continue Lasix Obesity and OSA. CPAP at night. Thrombocytopenia. Follow-up CBC while on heparin drip Anxiety. When necessary.  All the records are reviewed and case discussed with Care Management/Social Worker. Management plans discussed with the patient, his wife and they are in agreement.  CODE STATUS: Full Code  TOTAL TIME TAKING CARE OF THIS PATIENT: 35 minutes.   More than 50% of the time was spent in counseling/coordination of care: YES  POSSIBLE D/C IN 1-2 DAYS, DEPENDING ON CLINICAL CONDITION.   Demetrios Loll M.D on 10/30/2016 at 1:44 PM  Between  7am to 6pm - Pager - [phone]  After 6pm go to [email or web address] - Proofreader  [facility]  Office  [phone]  CC:  Primary care physician; Coral Spikes, DO  Note: This dictation was prepared with Dragon dictation along with smaller phrase technology. Any transcriptional errors that result from this process are unintentional.

## 2016-10-30 NOTE — Progress Notes (Signed)
Patient c/o chest pain at 0657. NTG sublingual administered x 1 dose with relief. Will continue to monitor.

## 2016-10-30 NOTE — Progress Notes (Signed)
No chest pain,seen by cardiologist,schedule for cardiac catheterization tomorrow,NPO past midnight,heparin drip infusing.

## 2016-10-31 ENCOUNTER — Telehealth: Payer: Self-pay | Admitting: Nurse Practitioner

## 2016-10-31 ENCOUNTER — Telehealth: Payer: Self-pay | Admitting: *Deleted

## 2016-10-31 ENCOUNTER — Encounter
Admission: EM | Disposition: A | Discharge status: Home / Self Care | Payer: Self-pay | Source: Home / Self Care | Attending: Internal Medicine

## 2016-10-31 DIAGNOSIS — I2510 Atherosclerotic heart disease of native coronary artery without angina pectoris: Secondary | ICD-10-CM

## 2016-10-31 HISTORY — PX: CORONARY STENT INTERVENTION: CATH118234

## 2016-10-31 HISTORY — PX: LEFT HEART CATH AND CORONARY ANGIOGRAPHY: CATH118249

## 2016-10-31 LAB — CBC
HCT: 50.1 % (ref 40.0–52.0)
Hemoglobin: 16.5 g/dL (ref 13.0–18.0)
MCH: 30.4 pg (ref 26.0–34.0)
MCHC: 32.9 g/dL (ref 32.0–36.0)
MCV: 92.4 fL (ref 80.0–100.0)
Platelets: 106 10*3/uL — ABNORMAL LOW (ref 150–440)
RBC: 5.42 MIL/uL (ref 4.40–5.90)
RDW: 15.6 % — ABNORMAL HIGH (ref 11.5–14.5)
WBC: 8.9 10*3/uL (ref 3.8–10.6)

## 2016-10-31 LAB — CREATININE, SERUM
Creatinine, Ser: 1.18 mg/dL (ref 0.61–1.24)
GFR calc Af Amer: >60 mL/min (ref >60)
GFR calc non Af Amer: 57 mL/min — ABNORMAL LOW (ref >60)

## 2016-10-31 LAB — GLUCOSE, CAPILLARY: Glucose-Capillary: 106 mg/dL — ABNORMAL HIGH (ref 65–99)

## 2016-10-31 LAB — HEPARIN LEVEL (UNFRACTIONATED): Heparin Unfractionated: 0.56 IU/mL (ref 0.30–0.70)

## 2016-10-31 LAB — APTT: aPTT: 66 seconds — ABNORMAL HIGH (ref 24–36)

## 2016-10-31 LAB — POCT ACTIVATED CLOTTING TIME: Activated Clotting Time: 279 seconds

## 2016-10-31 LAB — MRSA PCR SCREENING: MRSA by PCR: NEGATIVE

## 2016-10-31 SURGERY — LEFT HEART CATH AND CORONARY ANGIOGRAPHY
Anesthesia: Moderate Sedation

## 2016-10-31 MED ORDER — HEPARIN SODIUM (PORCINE) 1000 UNIT/ML IJ SOLN
INTRAMUSCULAR | Status: DC | PRN
  Administered 2016-10-31: 2000 [IU] via INTRAVENOUS
  Administered 2016-10-31 (×2): 5000 [IU] via INTRAVENOUS

## 2016-10-31 MED ORDER — ASPIRIN 81 MG PO CHEW
CHEWABLE_TABLET | ORAL | Status: DC | PRN
  Administered 2016-10-31: 324 mg via ORAL

## 2016-10-31 MED ORDER — NITROGLYCERIN IN D5W 200-5 MCG/ML-% IV SOLN
0.0000 ug/min | INTRAVENOUS | Status: DC
  Filled 2016-10-31: qty 250

## 2016-10-31 MED ORDER — CARBIDOPA-LEVODOPA 25-250 MG PO TABS
1.0000 | ORAL_TABLET | Freq: Every day | ORAL | Status: DC
  Filled 2016-10-31: qty 1

## 2016-10-31 MED ORDER — CARBIDOPA-LEVODOPA 25-250 MG PO TABS
1.0000 | ORAL_TABLET | Freq: Every day | ORAL | Status: DC
  Administered 2016-10-31: 1 via ORAL
  Filled 2016-10-31: qty 1

## 2016-10-31 MED ORDER — VERAPAMIL HCL 2.5 MG/ML IV SOLN
INTRAVENOUS | Status: AC
  Filled 2016-10-31: qty 2

## 2016-10-31 MED ORDER — CLOPIDOGREL BISULFATE 75 MG PO TABS
ORAL_TABLET | ORAL | Status: DC | PRN
  Administered 2016-10-31: 600 mg via ORAL

## 2016-10-31 MED ORDER — MIDAZOLAM HCL 2 MG/2ML IJ SOLN
INTRAMUSCULAR | Status: AC
  Filled 2016-10-31: qty 2

## 2016-10-31 MED ORDER — SODIUM CHLORIDE 0.9% FLUSH
3.0000 mL | Freq: Two times a day (BID) | INTRAVENOUS | Status: DC
  Administered 2016-10-31 – 2016-11-01 (×3): 3 mL via INTRAVENOUS

## 2016-10-31 MED ORDER — ENOXAPARIN SODIUM 40 MG/0.4ML ~~LOC~~ SOLN
40.0000 mg | SUBCUTANEOUS | Status: DC
  Administered 2016-11-01: 40 mg via SUBCUTANEOUS
  Filled 2016-10-31: qty 0.4

## 2016-10-31 MED ORDER — CLOPIDOGREL BISULFATE 75 MG PO TABS
75.0000 mg | ORAL_TABLET | Freq: Every day | ORAL | Status: DC
  Administered 2016-11-01 – 2016-11-02 (×2): 75 mg via ORAL
  Filled 2016-10-31 (×2): qty 1

## 2016-10-31 MED ORDER — NITROGLYCERIN IN D5W 200-5 MCG/ML-% IV SOLN
INTRAVENOUS | Status: AC | PRN
  Administered 2016-10-31: 10 ug/min via INTRAVENOUS

## 2016-10-31 MED ORDER — NITROGLYCERIN IN D5W 200-5 MCG/ML-% IV SOLN
INTRAVENOUS | Status: AC
  Filled 2016-10-31: qty 250

## 2016-10-31 MED ORDER — FENTANYL CITRATE (PF) 100 MCG/2ML IJ SOLN
INTRAMUSCULAR | Status: DC | PRN
  Administered 2016-10-31: 50 ug via INTRAVENOUS

## 2016-10-31 MED ORDER — SODIUM CHLORIDE 0.9 % IV SOLN
INTRAVENOUS | Status: AC
  Administered 2016-10-31: 13:00:00 via INTRAVENOUS

## 2016-10-31 MED ORDER — HEPARIN (PORCINE) IN NACL 2-0.9 UNIT/ML-% IJ SOLN
INTRAMUSCULAR | Status: AC
  Filled 2016-10-31: qty 500

## 2016-10-31 MED ORDER — HEPARIN SODIUM (PORCINE) 1000 UNIT/ML IJ SOLN
INTRAMUSCULAR | Status: AC
  Filled 2016-10-31: qty 1

## 2016-10-31 MED ORDER — FENTANYL CITRATE (PF) 100 MCG/2ML IJ SOLN
INTRAMUSCULAR | Status: AC
  Filled 2016-10-31: qty 2

## 2016-10-31 MED ORDER — CARBIDOPA-LEVODOPA 25-250 MG PO TABS
0.5000 | ORAL_TABLET | Freq: Every day | ORAL | Status: DC | PRN
  Filled 2016-10-31: qty 1

## 2016-10-31 MED ORDER — NITROGLYCERIN 5 MG/ML IV SOLN
INTRAVENOUS | Status: AC
  Filled 2016-10-31: qty 10

## 2016-10-31 MED ORDER — CARBIDOPA-LEVODOPA 25-250 MG PO TABS
2.0000 | ORAL_TABLET | Freq: Three times a day (TID) | ORAL | Status: DC
  Administered 2016-10-31 – 2016-11-01 (×4): 2 via ORAL
  Filled 2016-10-31 (×5): qty 2

## 2016-10-31 MED ORDER — CLOPIDOGREL BISULFATE 300 MG PO TABS
ORAL_TABLET | ORAL | Status: AC
  Filled 2016-10-31: qty 2

## 2016-10-31 MED ORDER — MORPHINE SULFATE (PF) 2 MG/ML IV SOLN
INTRAVENOUS | Status: AC
  Filled 2016-10-31: qty 1

## 2016-10-31 MED ORDER — NITROGLYCERIN 1 MG/10 ML FOR IR/CATH LAB
INTRA_ARTERIAL | Status: DC | PRN
  Administered 2016-10-31 (×2): 200 ug via INTRACORONARY

## 2016-10-31 MED ORDER — SODIUM CHLORIDE 0.9% FLUSH: 3.0000 mL | INTRAVENOUS | Status: DC | PRN

## 2016-10-31 MED ORDER — ASPIRIN 81 MG PO CHEW
CHEWABLE_TABLET | ORAL | Status: AC
  Filled 2016-10-31: qty 4

## 2016-10-31 MED ORDER — SODIUM CHLORIDE 0.9 % IV SOLN
250.0000 mL | INTRAVENOUS | Status: DC | PRN
Start: 2016-10-31 — End: 2016-11-02

## 2016-10-31 MED ORDER — CARBIDOPA-LEVODOPA 25-250 MG PO TABS
1.5000 | ORAL_TABLET | Freq: Every day | ORAL | Status: DC
  Filled 2016-10-31: qty 2

## 2016-10-31 MED ORDER — MORPHINE SULFATE (PF) 2 MG/ML IV SOLN
2.0000 mg | Freq: Once | INTRAVENOUS | Status: AC
  Administered 2016-10-31: 2 mg via INTRAVENOUS

## 2016-10-31 MED ORDER — ENOXAPARIN SODIUM 40 MG/0.4ML ~~LOC~~ SOLN: 40.0000 mg | SUBCUTANEOUS | Status: DC

## 2016-10-31 MED ORDER — IOPAMIDOL (ISOVUE-300) INJECTION 61%
INTRAVENOUS | Status: DC | PRN
  Administered 2016-10-31: 235 mL via INTRA_ARTERIAL

## 2016-10-31 SURGICAL SUPPLY — 17 items
BALLN MINITREK RX 2.0X12 (BALLOONS) ×2
BALLN TREK RX 2.75X20 (BALLOONS) ×2
BALLOON MINITREK RX 2.0X12 (BALLOONS) ×1 IMPLANT
BALLOON TREK RX 2.75X20 (BALLOONS) ×1 IMPLANT
CATH 5F 110X4 TIG (CATHETERS) ×2 IMPLANT
CATH INFINITI 5 FR JL3.5 (CATHETERS) ×2 IMPLANT
CATH VISTA GUIDE 6FR JR4 (CATHETERS) ×2 IMPLANT
DEVICE INFLAT 30 PLUS (MISCELLANEOUS) ×2 IMPLANT
DEVICE RAD COMP TR BAND LRG (VASCULAR PRODUCTS) ×2 IMPLANT
GLIDESHEATH SLEND SS 6F .021 (SHEATH) ×2 IMPLANT
KIT MANI 3VAL PERCEP (MISCELLANEOUS) ×2 IMPLANT
PACK CARDIAC CATH (CUSTOM PROCEDURE TRAY) ×2 IMPLANT
STENT RESOLUTE ONYX 4.0X26 (Permanent Stent) ×2 IMPLANT
WIRE HITORQ VERSACORE ST 145CM (WIRE) ×2 IMPLANT
WIRE INTUITION PROPEL ST 180CM (WIRE) ×2 IMPLANT
WIRE ROSEN-J .035X260CM (WIRE) ×2 IMPLANT
WIRE RUNTHROUGH .014X300CM (WIRE) ×2 IMPLANT

## 2016-10-31 NOTE — Interval H&P Note (Signed)
History and Physical Interval Note:  10/31/2016 8:02 AM  [name]  has presented today for surgery, with the diagnosis of non ST segment myocardial infarction  The various methods of treatment have been discussed with the patient and family. After consideration of risks, benefits and other options for treatment, the patient has consented to  Procedure(s): Left Heart Cath and Coronary Angiography (N/A) as a surgical intervention .  The patient's history has been reviewed, patient examined, no change in status, stable for surgery.  I have reviewed the patient's chart and labs.  Questions were answered to the patient's satisfaction.     [name]

## 2016-10-31 NOTE — Telephone Encounter (Signed)
S/w pt's wife. Will determine when pt needs f/u based on d/c. She will call back to confirm if pt will be able to come to 7/19 OV with Dr. Caryl Comes

## 2016-10-31 NOTE — Progress Notes (Signed)
Patient and patient's spouse report that patient takes 2 sinemet IR (carbidopa-levodopa 25 - 250 mg) tablets three times a day rather than the ordered 1 tablet six times a day. RN checked with pharmacist who reviewed and had no objections to changing dosage and frequency. RN left message with E Link RN for Margaree Mackintosh MD to adjust patient's dosages per patient request.

## 2016-10-31 NOTE — Telephone Encounter (Signed)
Pt wife called, states pt is in hospital due to a heart attack, and she asks if he is out by his appt on Thursday, should he keep his appointments with Dr. Caryl Comes and Murray Hodgkins, NP. Please call and advise.

## 2016-10-31 NOTE — Progress Notes (Signed)
Have attempted to call report twice. Was asked to hold patient both times due to activity on ICU. Have alerted RN to need to move this patient due to limited space in recovery area. RN aware. States may call report in 10 minutes.

## 2016-10-31 NOTE — H&P (View-Only) (Signed)
Progress Note  Patient Name: Andre [name] Date of Encounter: 10/30/2016  Primary Cardiologist: Dr. Caryl Comes  Patient Profile     81 y.o. male with history of CAD-CABG 1 (LIMA-LAD in 1998 at [facility]),HFpEF, PAF s/p DCCV on Eliquis, hypertensive heart disease, PE, Parkinson's disease, and morbid obesity who was admitted to [facility] with ACS/mild non-STEMI (borderline troponin elevation) on July 13 based on labs drawn on July 12. Was seen by PCP on July 12 with complaints Of chest pain and left arm pain over the preceding 3 evenings. Symptoms were associated with some dyspnea. Lasted roughly 2 hours. WITH aspirin. Office EKG revealed sinus rhythm with a rate of 60 BPM. Left axis deviation/LAFB with poor R-wave progression. Troponin drawn in the office was 0.21.  Principal Problem:   NSTEMI (non-ST elevated myocardial infarction) ([place]) Active Problems:   PAF (paroxysmal atrial fibrillation) ([place])   Coronary artery disease involving native coronary artery of native heart with angina pectoris ([place])   Parkinson's disease ([place])   Hyperlipidemia with target LDL less than 70   Essential hypertension   (HFpEF) heart failure with preserved ejection fraction ([place])   Subjective   He woke up this morning very anxious and also panic attack. Follow-up his upcoming procedure. He did have some chest pain associated with that. Resolved with antiemetic and nitroglycerin. No longer having chest pain.  Inpatient Medications    Scheduled Meds: . amiodarone  200 mg Oral Daily  . aspirin EC  81 mg Oral Daily  . atorvastatin  40 mg Oral Daily  . busPIRone  7.5 mg Oral BID  . carbidopa-levodopa  2 tablet Oral TID  . carvedilol  6.25 mg Oral BID WC  . fluticasone  1 spray Each Nare Daily  . furosemide  20 mg Oral Daily  . isosorbide mononitrate  30 mg Oral Daily  . levothyroxine  50 mcg Oral QAC breakfast  . mirabegron ER  50 mg Oral Daily  . pantoprazole  40 mg Oral Daily  . sodium chloride flush  3 mL  Intravenous Q12H   Continuous Infusions: . sodium chloride    . sodium chloride 10 mL/hr at 10/29/16 0600  . heparin 1,150 Units/hr (10/30/16 0400)   PRN Meds: sodium chloride, acetaminophen, albuterol, ALPRAZolam, nitroGLYCERIN, ondansetron (ZOFRAN) IV, sodium chloride flush, zolpidem   Vital Signs    Vitals:   10/30/16 0414 10/30/16 0657 10/30/16 0703 10/30/16 0807  BP: 139/80 (!) 156/74 116/67 131/75  Pulse: (!) 58 68 (!) 57 66  Resp: 16   18  Temp: (!) 97.4 F (36.3 C)   98.1 F (36.7 C)  TempSrc: Oral   Oral  SpO2: 95%   94%  Weight:      Height:        Intake/Output Summary (Last 24 hours) at 10/30/16 1429 Last data filed at 10/30/16 0900  Gross per 24 hour  Intake              659 ml  Output                0 ml  Net              659 ml   Filed Weights   10/28/16 0853  Weight: 260 lb (117.9 kg)    Telemetry    Maintaining sinus rhythm. Occasional PVCs. Rates 60s-80s - Personally Reviewed  ECG    No new EKG today  Physical Exam   General appearance: alert, cooperative, appears stated age, no  distress, morbidly obese and Very pleasant mood and affect. Very talkative Neck: no carotid bruit and no JVD Lungs: Nonlabored, good air movement. Distant breath sounds due to body habitus. Otherwise CTAB Heart: RRR, normal S1 and S2. No obvious M/R/G. Nondisplaced PMI. (Difficult to palpate) Abdomen: soft, non-tender; bowel sounds normal; no masses,  no organomegaly and Unable to assess HSM or HJR due to obesity Extremities: extremities normal, atraumatic, no cyanosis or edema Pulses: 2+ and symmetric Neurologic: Mental status: Alert, oriented, thought content appropriate; nonfocal. Minimal tremor.  Labs    Chemistry  Recent Labs Lab 10/28/16 0912  NA 138  K 3.9  CL 103  CO2 26  GLUCOSE 139*  BUN 20  CREATININE 1.14  CALCIUM 9.3  GFRNONAA 60*  GFRAA >60  ANIONGAP 9     Hematology  Recent Labs Lab 10/28/16 0912 10/29/16 0336 10/30/16 0511    WBC 9.7 12.1* 8.5  RBC 5.40 5.40 5.06  HGB 16.4 16.4 15.4  HCT 48.9 49.6 46.8  MCV 90.5 91.9 92.5  MCH 30.3 30.3 30.4  MCHC 33.5 33.0 32.8  RDW 15.1* 15.2* 15.1*  PLT 114* 128* 96*    Cardiac Enzymes  Recent Labs Lab 10/28/16 0912 10/28/16 1402 10/28/16 1901  TROPONINI 0.12* 0.13* 0.11*   No results for input(s): TROPIPOC in the last 168 hours.   BNPNo results for input(s): BNP, PROBNP in the last 168 hours.   DDimer No results for input(s): DDIMER in the last 168 hours.   Radiology    No results found.  Cardiac Studies   Echo ordered, still pending.  Most Recent Cardiac Studies: Lexiscan Myoview 08/18/2016: Pharmacological myocardial perfusion imaging study with no significant ischemia Normal wall motion, EF estimated at 67% No EKG changes concerning for ischemia at peak stress or in recovery. Low risk scan      Assessment & Plan    Principal Problem:   NSTEMI (non-ST elevated myocardial infarction) (HCC)/Coronary artery disease involving native coronary artery of native heart with angina pectoris ([place]) Symptoms concerning for progressive angina and now with mild troponin elevation consistent with possible ACS/non-STEMI. Recent Myoview with no evidence of ischemia, however now presenting with concerning symptoms - the only reasonable evaluation at this point would be cardiac catheterization.  Currently waiting ELIQUIS washout with planned cardiac catheterization on Monday, July 16 (Dr. Fletcher Anon)  On IV heparin infusion And when necessary nitroglycerin  With the one episode of chest pain this morning, I'm adding Imdur Plan: Cardiac catheterization tomorrow, first case  Active Problems:   PAF (paroxysmal atrial fibrillation) (HCC) - maintaining sinus rhythm on chronic amiodarone.  Given diagnosis of non-STEMI, converted from verapamil to carvedilol. Tolerating well.  ELIQUIS on hold for upcoming cardiac cath. On IV heparin This patients CHA2DS2-VASc Score  and unadjusted Ischemic Stroke Rate (% per year) is equal to 7.2 % stroke rate/year from a score of 5 Above score calculated as 1 point each if present [CHF, HTN, DM, Vascular=MI/PAD/Aortic Plaque, Age if 65-74, or Male] Above score calculated as 2 points each if present [Age > 75, or Stroke/TIA/TE]  Restart ELIQUIS post cath    Parkinson's disease ([place]) - per IM   Hyperlipidemia with target LDL less than 70 - on statin    Essential hypertension - better controlled.   No change to current medications.    (HFpEF) heart failure with preserved ejection fraction (HCC) - continues to be euvolemic on oral Lasix.  Verapamil Converted to carvedilol  Monitor for blood pressure change or increase,  may want to consider ARB for afterload reduction.    Signed, Glenetta Hew, MD  10/30/2016, 2:29 PM

## 2016-10-31 NOTE — Plan of Care (Signed)
Problem: Pain Managment: Goal: General experience of comfort will improve Outcome: Progressing Patient sleeping for most of afternoon, nitroglycerin infusion transitioned off.  Problem: Cardiovascular: Goal: Ability to achieve and maintain adequate cardiovascular perfusion will improve Outcome: Progressing Vital signs stable. Patient with no shortness of breath on room air. Goal: Vascular access site(s) Level 0-1 will be maintained Outcome: Progressing TR band taken off at 12:30 per orders. Maintaining at level 0 at this time.

## 2016-10-31 NOTE — Progress Notes (Signed)
ANTICOAGULATION CONSULT NOTE - Initial Consult  Pharmacy Consult for Heparin Drip Indication: chest pain/ACS  Allergies  Allergen Reactions  . Pravastatin Other (See Comments)  . Prednisone Other (See Comments)    Pt states that med makes him hyper Pt states that med makes him hyper    Patient Measurements: Height: 5\' 6"  (167.6 cm) Weight: 260 lb (117.9 kg) IBW/kg (Calculated) : 63.8 Heparin Dosing Weight: 91.2 kg  Vital Signs: Temp: 97.5 F (36.4 C) (07/16 0346) Temp Source: Oral (07/16 0346) BP: 128/58 (07/16 0346) Pulse Rate: 63 (07/16 0346)  Labs:  Recent Labs  10/28/16 0912  10/28/16 1402 10/28/16 1901 10/29/16 0336 10/30/16 0511 10/31/16 0523  HGB 16.4  --   --   --  16.4 15.4  --   HCT 48.9  --   --   --  49.6 46.8  --   PLT 114*  --   --   --  128* 96*  --   APTT  --   < > 128* 72* 76* 71* 66*  LABPROT  --   --  15.5*  --   --   --   --   INR  --   --  1.22  --   --   --   --   HEPARINUNFRC  --   < > 3.26*  --  1.70* 0.87* 0.56  CREATININE 1.14  --   --   --   --   --   --   TROPONINI 0.12*  --  0.13* 0.11*  --   --   --   < > = values in this interval not displayed.  Estimated Creatinine Clearance: 64.5 mL/min (by C-G formula based on SCr of 1.14 mg/dL).   Medical History: Past Medical History:  Diagnosis Date  . Cervical spondylosis 10/01/2013  . Chronic diastolic CHF (congestive heart failure) ([place])    a. 07/2016 Echo: >55%.  . Coronary artery disease    a. 1998 s/p mini-cabg @ [facility] - pt reports one vessel bypass to LAD;  b. 07/2016 St Echo:  Inadequate HR (max 97) w/ hypertensive response (220/96). Ex time only 2:54 - stopped due to dyspnea and leg pain;  c.  08/2016 MV: EF 67%, no ischemia, low risk.  . Depression   . GERD (gastroesophageal reflux disease)   . Hyperlipidemia   . Hypertension   . PAF (paroxysmal atrial fibrillation) ([facility])    a. s/p DCCV-->maintaining sinus on amiodarone;  b. CHA2DS2VASc = 5-->eliquis.  . Parkinson's disease  ([place])    tremors  . Pulmonary embolism ([place]) 2011  . 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 Sleep apnea    wears CPAP    Medications:  Scheduled:  . amiodarone  200 mg Oral Daily  . aspirin EC  81 mg Oral Daily  . atorvastatin  40 mg Oral Daily  . busPIRone  7.5 mg Oral BID  . carbidopa-levodopa  2 tablet Oral TID  . carvedilol  6.25 mg Oral BID WC  . fluticasone  1 spray Each Nare Daily  . furosemide  20 mg Oral Daily  . isosorbide mononitrate  30 mg Oral Daily  . levothyroxine  50 mcg Oral QAC breakfast  . mirabegron ER  50 mg Oral Daily  . pantoprazole  40 mg Oral Daily  . sodium chloride flush  3 mL Intravenous Q12H   Infusions:  . sodium chloride    . sodium chloride 10 mL/hr at 10/29/16 0600  . heparin 1,150  Units/hr (10/30/16 1800)    Assessment: 81 yo Male with Elevated Troponin, chest discomfort. Patient w/ Hx of PE, Afib-on Apixaban 5 mg bid with last dose noted on Med Rec as being yesterday 10/27/16 at 1800.  Baseline: * Blood (Blue top) drawn in ED was hemolyzed- Lab to redraw.   APTT: 128 (drawn ~ 1 hour after Heparin bolus given and drip running- d/t blood drawn in ER hemolyzed and patient went to floor). Will recheck at 1900.  HL: 3.26 (Patient on Apixaban which can affect the level and was drawn ~1 hour after bolus/drip started) INR: 1.22 PLT: 114,  Hgb 16.4  Goal of Therapy:  Heparin level 0.3-0.7 units/ml aPTT 66-102 seconds Monitor platelets by anticoagulation protocol: Yes   Plan:  7/13 1901  APTT therapeutic at 72. Will continue current infusion rate of heparin 1150u/hr and will recheck aPTT in 8 hours along with and Anti-Xa level.    Eloise Harman, PharmD, BCPS Clinical Pharmacist 10/31/2016 5:59 AM    7/14 0330 aPTT 76, heparin level 1.70. Continue current regimen. Recheck aPTT, CBC, and heparin level with tomorrow AM labs.  7/15 AM aPTT 71, heparin level 0.87. Continue current regimen. Recheck aPTT, heparin level, and CBC with tomorrow  AM labs.  7/16 AM aPTT 66, heparin level 0.56. Levels appear to be correlating in therapeutic range so will continue to follow by heparin levels only. Continue current regimen. Recheck heparin level and CBC with tomorrow AM labs.  Sim Boast, PharmD, BCPS  10/31/16 5:59 AM

## 2016-10-31 NOTE — Progress Notes (Signed)
eLink Physician-Brief Progress Note Patient Name: Andre [name] DOB: February 04, 1936 MRN: [id number]   Date of Service  10/31/2016  HPI/Events of Note  Adjusting sinemet order to 2 tid D/w pharamcy to adjusrt  eICU Interventions       Intervention Category Intermediate Interventions: Communication with other healthcare providers and/or family  Raylene Miyamoto. 10/31/2016, 6:28 PM

## 2016-10-31 NOTE — Progress Notes (Signed)
Patient has refused cpap therapy for the night.

## 2016-10-31 NOTE — Telephone Encounter (Signed)
Pt wife requested to have Dr. Lacinda Axon review Pt chart while he's admitted .

## 2016-10-31 NOTE — Progress Notes (Signed)
[facility] at [place] NAME: Harrel Ferrone    MR#:  [id number]  DATE OF BIRTH:  [birth year]  SUBJECTIVE:  CHIEF COMPLAINT:   Chief Complaint  Patient presents with  . Elevated Troponin   S/p PCI this am, still chest pain with Left Shoulder and arm pain. On nitro drip. REVIEW OF SYSTEMS:  Review of Systems  Constitutional: Negative for chills, fever and malaise/fatigue.  HENT: Negative for sore throat.   Eyes: Negative for blurred vision and double vision.  Respiratory: Negative for cough, hemoptysis, shortness of breath, wheezing and stridor.   Cardiovascular: Positive for chest pain. Negative for palpitations and leg swelling.  Gastrointestinal: Negative for abdominal pain, blood in stool, diarrhea, melena, nausea and vomiting.  Genitourinary: Negative for dysuria and hematuria.  Musculoskeletal: Negative for back pain.  Neurological: Negative for dizziness, sensory change, focal weakness, loss of consciousness and headaches.  Psychiatric/Behavioral: Negative for depression. The patient is nervous/anxious.     DRUG ALLERGIES:   Allergies  Allergen Reactions  . Pravastatin Other (See Comments)  . Prednisone Other (See Comments)    Pt states that med makes him hyper Pt states that med makes him hyper   VITALS:  Blood pressure 119/67, pulse 64, temperature 98.1 F (36.7 C), temperature source Oral, resp. rate 15, height 5\' 6"  (1.676 m), weight 260 lb (117.9 kg), SpO2 95 %. PHYSICAL EXAMINATION:  Physical Exam  Constitutional: He is oriented to person, place, and time. No distress.  Morbid Obese.  HENT:  Head: Normocephalic.  Mouth/Throat: Oropharynx is clear and moist.  Eyes: Conjunctivae and EOM are normal. No scleral icterus.  Neck: Normal range of motion. Neck supple. No JVD present. No tracheal deviation present.  Cardiovascular: Normal rate, regular rhythm and normal heart sounds.  Exam reveals no gallop.   No murmur  heard. Pulmonary/Chest: Effort normal and breath sounds normal. No respiratory distress. He has no wheezes. He has no rales.  Abdominal: Soft. Bowel sounds are normal. He exhibits no distension. There is no tenderness.  Musculoskeletal: Normal range of motion. He exhibits no edema or tenderness.  Neurological: He is alert and oriented to person, place, and time. No cranial nerve deficit.  Skin: No rash noted. No erythema.  Psychiatric: Affect normal.   LABORATORY PANEL:  Male CBC  Recent Labs Lab 10/31/16 0523  WBC 8.9  HGB 16.5  HCT 50.1  PLT 106*   ------------------------------------------------------------------------------------------------------------------ Chemistries   Recent Labs Lab 10/28/16 0912 10/31/16 0523  NA 138  --   K 3.9  --   CL 103  --   CO2 26  --   GLUCOSE 139*  --   BUN 20  --   CREATININE 1.14 1.18  CALCIUM 9.3  --    RADIOLOGY:  No results found. ASSESSMENT AND PLAN:   NSTEMI/CAD  He was on Heparin drip,  Aspirin, Coreg, Lipitor, nitroglycerin and morphine when necessary,  Hold Eliquis 48 hours, cardiac catheter: Significant underlying three-vessel coronary artery disease with occluded LAD and patent LIMA to LAD. Severe proximal RCA stenosis with heavy thrombus burden is the culprit for non-ST elevation myocardial infarction. There is also moderate distal left main stenosis and significant disease in the ramus intermedius branch. PCI. Per Dr. Fletcher Anon, started the patient on nitroglycerin drip for chest pain control. Monitor in the ICU overnight. The patient will likely have an infarct in the RV branch distribution.  Continue dual antiplatelet therapy for now. Eliquis can be resumed tomorrow.  History of PAF. Hold Eliquis, continue amiodarone. Start Eliquis tomorrow.  Hypertension. Continue home hypertension medication.  Chronic diastolic CHF. Stable. Continue Lasix Obesity and OSA. CPAP at night. Thrombocytopenia. Follow-up  CBC. Anxiety. Xanax When necessary.  I discussed with Dr. Fletcher Anon. All the records are reviewed and case discussed with Care Management/Social Worker. Management plans discussed with the patient, his wife and they are in agreement.  CODE STATUS: Full Code  TOTAL TIME TAKING CARE OF THIS PATIENT: 39 minutes.   More than 50% of the time was spent in counseling/coordination of care: YES  POSSIBLE D/C IN 1-2 DAYS, DEPENDING ON CLINICAL CONDITION.   Demetrios Loll M.D on 10/31/2016 at 12:05 PM  Between 7am to 6pm - Pager - [phone]  After 6pm go to [email or web address] - Proofreader  [facility]  Office  [phone]  CC: Primary care physician; Coral Spikes, DO  Note: This dictation was prepared with Dragon dictation along with smaller phrase technology. Any transcriptional errors that result from this process are unintentional.

## 2016-11-01 LAB — CBC
HCT: 44.8 % (ref 40.0–52.0)
Hemoglobin: 14.8 g/dL (ref 13.0–18.0)
MCH: 30 pg (ref 26.0–34.0)
MCHC: 33 g/dL (ref 32.0–36.0)
MCV: 91.1 fL (ref 80.0–100.0)
Platelets: 108 10*3/uL — ABNORMAL LOW (ref 150–440)
RBC: 4.92 MIL/uL (ref 4.40–5.90)
RDW: 15.4 % — ABNORMAL HIGH (ref 11.5–14.5)
WBC: 10 10*3/uL (ref 3.8–10.6)

## 2016-11-01 LAB — BASIC METABOLIC PANEL
Anion gap: 9 (ref 5–15)
BUN: 20 mg/dL (ref 6–20)
CO2: 26 mmol/L (ref 22–32)
Calcium: 8.7 mg/dL — ABNORMAL LOW (ref 8.9–10.3)
Chloride: 104 mmol/L (ref 101–111)
Creatinine, Ser: 1.24 mg/dL (ref 0.61–1.24)
GFR calc Af Amer: >60 mL/min (ref >60)
GFR calc non Af Amer: 54 mL/min — ABNORMAL LOW (ref >60)
Glucose, Bld: 111 mg/dL — ABNORMAL HIGH (ref 65–99)
Potassium: 3.6 mmol/L (ref 3.5–5.1)
Sodium: 139 mmol/L (ref 135–145)

## 2016-11-01 MED ORDER — LOSARTAN POTASSIUM 25 MG PO TABS
25.0000 mg | ORAL_TABLET | Freq: Every day | ORAL | Status: DC
  Administered 2016-11-01 – 2016-11-02 (×2): 25 mg via ORAL
  Filled 2016-11-01 (×2): qty 1

## 2016-11-01 MED ORDER — APIXABAN 5 MG PO TABS
5.0000 mg | ORAL_TABLET | Freq: Two times a day (BID) | ORAL | Status: DC
  Administered 2016-11-01 – 2016-11-02 (×2): 5 mg via ORAL
  Filled 2016-11-01 (×2): qty 1

## 2016-11-01 NOTE — Evaluation (Signed)
Physical Therapy Evaluation Patient Details Name: Andre [name] MRN: [id number] DOB: 10/27/1935 Today's Date: 11/01/2016   History of Present Illness  Pt is a 81 y/o M who presented with chest pain.  He was found to have elevated troponin and NSTEMI.  Pt underwent LHC and underwent successful PCI to proximal RCA on 10/31/16.  Pt's PMH includes PE, CHF, a-fib, cervical spondylosis, parkinson's, back surgery, CABG.    Clinical Impression  Pt admitted with above diagnosis. Andre [name] was independent with all aspects of mobility PTA and continues to be independent.  BP 96/47 but pt denies any dizziness or lightheadedness.  No signs of instability with higher level balance activities.  No skilled PT needs identified, will sign off.    Follow Up Recommendations No PT follow up    Equipment Recommendations  None recommended by PT    Recommendations for Other Services Other (comment) (Cardiac Rehab)     Precautions / Restrictions Precautions Precautions: Other (comment) Precaution Comments: Monitor BP  Restrictions Weight Bearing Restrictions: Yes RUE Weight Bearing: Non weight bearing Other Position/Activity Restrictions: NWB RUE for 48 hrs following cardiac cath.        Mobility  Bed Mobility               General bed mobility comments: Pt sitting in chair at start and end of therapy  Transfers Overall transfer level: Independent Equipment used: None             General transfer comment: No instability noted.  Pt performs independently.  Ambulation/Gait Ambulation/Gait assistance: Independent Ambulation Distance (Feet): 100 Feet Assistive device: None Gait Pattern/deviations: WFL(Within Functional Limits)   Gait velocity interpretation: at or above normal speed for age/gender General Gait Details: No instability noted.  Pt able to maintain balance with higher level balance activities when ambulating  Stairs            Wheelchair Mobility    Modified  Rankin (Stroke Patients Only)       Balance Overall balance assessment: Independent                               Standardized Balance Assessment Standardized Balance Assessment : Dynamic Gait Index   Dynamic Gait Index Level Surface: Normal Change in Gait Speed: Normal Gait with Horizontal Head Turns: Normal Gait with Vertical Head Turns: Normal Gait and Pivot Turn: Normal Step Over Obstacle: Normal Step Around Obstacles: Normal       Pertinent Vitals/Pain Pain Assessment: No/denies pain    Home Living Family/patient expects to be discharged to:: Private residence Living Arrangements: Spouse/significant other Available Help at Discharge: Family;Available 24 hours/day Type of Home: House Home Access: Stairs to enter   [employer] of Steps: 1 Home Layout: One level Home Equipment: None      Prior Function Level of Independence: Independent         Comments: Pt reports 1 fall in the past 6 months after tripping on a rug.  Pt ambulates without AD.  Independent with all aspects of mobility.     Hand Dominance        Extremity/Trunk Assessment   Upper Extremity Assessment Upper Extremity Assessment: Overall WFL for tasks assessed    Lower Extremity Assessment Lower Extremity Assessment: Overall WFL for tasks assessed       Communication   Communication: No difficulties  Cognition Arousal/Alertness: Awake/alert Behavior During Therapy: WFL for tasks assessed/performed Overall Cognitive Status: Within  Functional Limits for tasks assessed                                        General Comments General comments (skin integrity, edema, etc.): BP 96/47.  All other VSS throughout session.  Pt denies any dizziness or lightheadedness.    Exercises     Assessment/Plan    PT Assessment Patent does not need any further PT services  PT Problem List         PT Treatment Interventions      PT Goals (Current goals  can be found in the Care Plan section)  Acute Rehab PT Goals Patient Stated Goal: to go home PT Goal Formulation: All assessment and education complete, DC therapy    Frequency     Barriers to discharge        Co-evaluation               AM-PAC PT "6 Clicks" Daily Activity  Outcome Measure Difficulty turning over in bed (including adjusting bedclothes, sheets and blankets)?: None Difficulty moving from lying on back to sitting on the side of the bed? : None Difficulty sitting down on and standing up from a chair with arms (e.g., wheelchair, bedside commode, etc,.)?: None Help needed moving to and from a bed to chair (including a wheelchair)?: None Help needed walking in hospital room?: None Help needed climbing 3-5 steps with a railing? : None 6 Click Score: 24    End of Session Equipment Utilized During Treatment: Gait belt Activity Tolerance: Patient tolerated treatment well Patient left: in chair;with call bell/phone within reach;with family/visitor present Nurse Communication: Mobility status;Other (comment) (BP) PT Visit Diagnosis: Unsteadiness on feet (R26.81)    Time: 2119-4174 PT Time Calculation (min) (ACUTE ONLY): 17 min   Charges:   PT Evaluation $PT Eval Low Complexity: 1 Procedure     PT G Codes:        Collie Siad PT, DPT 11/01/2016, 2:31 PM

## 2016-11-01 NOTE — Progress Notes (Signed)
[facility] at [place] NAME: Nichols Corter    MR#:  [id number]  DATE OF BIRTH:  [birth year]  SUBJECTIVE:  CHIEF COMPLAINT:   Chief Complaint  Patient presents with  . Elevated Troponin   No chest pain, but has generalized weakness Off nitro drip. REVIEW OF SYSTEMS:  Review of Systems  Constitutional: Positive for malaise/fatigue. Negative for chills and fever.  HENT: Negative for sore throat.   Eyes: Negative for blurred vision and double vision.  Respiratory: Negative for cough, hemoptysis, shortness of breath, wheezing and stridor.   Cardiovascular: Negative for chest pain, palpitations and leg swelling.  Gastrointestinal: Negative for abdominal pain, blood in stool, diarrhea, melena, nausea and vomiting.  Genitourinary: Negative for dysuria and hematuria.  Musculoskeletal: Negative for back pain.  Neurological: Positive for weakness. Negative for dizziness, sensory change, focal weakness, loss of consciousness and headaches.  Psychiatric/Behavioral: Negative for depression. The patient is not nervous/anxious.     DRUG ALLERGIES:   Allergies  Allergen Reactions  . Pravastatin Other (See Comments)  . Prednisone Other (See Comments)    Pt states that med makes him hyper Pt states that med makes him hyper   VITALS:  Blood pressure 99/67, pulse (!) 58, temperature 97.6 F (36.4 C), temperature source Oral, resp. rate (!) 23, height 5\' 6"  (1.676 m), weight 260 lb (117.9 kg), SpO2 94 %. PHYSICAL EXAMINATION:  Physical Exam  Constitutional: He is oriented to person, place, and time. No distress.  Morbid Obese.  HENT:  Head: Normocephalic.  Mouth/Throat: Oropharynx is clear and moist.  Eyes: Conjunctivae and EOM are normal. No scleral icterus.  Neck: Normal range of motion. Neck supple. No JVD present. No tracheal deviation present.  Cardiovascular: Normal rate, regular rhythm and normal heart sounds.  Exam reveals no gallop.   No  murmur heard. Pulmonary/Chest: Effort normal and breath sounds normal. No respiratory distress. He has no wheezes. He has no rales.  Abdominal: Soft. Bowel sounds are normal. He exhibits no distension. There is no tenderness.  Musculoskeletal: Normal range of motion. He exhibits no edema or tenderness.  Neurological: He is alert and oriented to person, place, and time. No cranial nerve deficit.  Skin: No rash noted. No erythema.  Psychiatric: Affect normal.   LABORATORY PANEL:  Male CBC  Recent Labs Lab 11/01/16 0357  WBC 10.0  HGB 14.8  HCT 44.8  PLT 108*   ------------------------------------------------------------------------------------------------------------------ Chemistries   Recent Labs Lab 11/01/16 0357  NA 139  K 3.6  CL 104  CO2 26  GLUCOSE 111*  BUN 20  CREATININE 1.24  CALCIUM 8.7*   RADIOLOGY:  No results found. ASSESSMENT AND PLAN:   NSTEMI/CAD  He was on Heparin drip,  Aspirin, Coreg, Lipitor, nitroglycerin and morphine when necessary,  Hold Eliquis 48 hours, cardiac catheter: Significant underlying three-vessel coronary artery disease with occluded LAD and patent LIMA to LAD. Severe proximal RCA stenosis with heavy thrombus burden is the culprit for non-ST elevation myocardial infarction. There is also moderate distal left main stenosis and significant disease in the ramus intermedius branch. PCI. Per Dr. Fletcher Anon, started the patient on nitroglycerin drip for chest pain control. Monitor in the ICU overnight. The patient will likely have an infarct in the RV branch distribution.  On nitroglycerin drip. Continue dual antiplatelet therapy and resume Eliquis today. Cardiac rehab.  History of PAF. Hold Eliquis, continue amiodarone and Coreg. Resume Eliquis today.  Hypertension. Continue home hypertension medication.  Chronic diastolic  CHF. Stable. Continue Lasix Obesity and OSA. CPAP at night. Thrombocytopenia. Follow-up CBC. Anxiety. Xanax When  necessary.  I discussed with cardiology PA, Ryan. All the records are reviewed and case discussed with Care Management/Social Worker. Management plans discussed with the patient, his wife and they are in agreement.  CODE STATUS: Full Code  TOTAL TIME TAKING CARE OF THIS PATIENT: 33 minutes.   More than 50% of the time was spent in counseling/coordination of care: YES  POSSIBLE D/C IN 1-2 DAYS, DEPENDING ON CLINICAL CONDITION.   Demetrios Loll M.D on 11/01/2016 at 3:13 PM  Between 7am to 6pm - Pager - [phone]  After 6pm go to [email or web address] - Proofreader  [facility]  Office  [phone]  CC: Primary care physician; Coral Spikes, DO  Note: This dictation was prepared with Dragon dictation along with smaller phrase technology. Any transcriptional errors that result from this process are unintentional.

## 2016-11-01 NOTE — Progress Notes (Signed)
Ashely A., PT called and asked me to remind pt to not use his right hand/arm to push or pull himself/objects as this was where he was accessed for cardiac catheterization. Pt reminded, he understands and is agreeable to not using his right hand/arm to push or pull himself or objects.

## 2016-11-01 NOTE — Progress Notes (Signed)
Patient Name: Andre [name] Date of Encounter: 11/01/2016  Primary Cardiologist: [facility] Problem List     Principal Problem:   NSTEMI (non-ST elevated myocardial infarction) [facility]) Active Problems:   PAF (paroxysmal atrial fibrillation) ([place])   Coronary artery disease involving native coronary artery of native heart with angina pectoris (HCC)   (HFpEF) heart failure with preserved ejection fraction (HCC)   Parkinson's disease ([place])   Hyperlipidemia with target LDL less than 70   Essential hypertension     Subjective   No further chest pain. Wants to go home. Status post LHC on 10/31/16 that showed significant underlying 3-vessel CAD with occluded LAD and patent LIMA-LAD. Severe proximal RCA stenosis with heavy thrombus burden being the culprit for the NSTEMI. There was also moderate distal left main stenosis and signficant disease in the ramus branch. He underwent successful PCI/DES to the proximal RCA with complication by embolization into a large RV branch originating at the lesion. Balloon angioplasty was performed on the ostium of this branch via the stent struts which established TIMI1 flow. The procedure was overall difficult due to large thrombus burden. Post cath labs stable.   Inpatient Medications    Scheduled Meds: . amiodarone  200 mg Oral Daily  . aspirin EC  81 mg Oral Daily  . atorvastatin  40 mg Oral Daily  . busPIRone  7.5 mg Oral BID  . carbidopa-levodopa  2 tablet Oral TID WC  . carvedilol  6.25 mg Oral BID WC  . clopidogrel  75 mg Oral Q breakfast  . enoxaparin (LOVENOX) injection  40 mg Subcutaneous Q24H  . fluticasone  1 spray Each Nare Daily  . furosemide  20 mg Oral Daily  . isosorbide mononitrate  30 mg Oral Daily  . levothyroxine  50 mcg Oral QAC breakfast  . mirabegron ER  50 mg Oral Daily  . pantoprazole  40 mg Oral Daily  . sodium chloride flush  3 mL Intravenous Q12H   Continuous Infusions: . sodium chloride    . nitroGLYCERIN  Stopped (10/31/16 1500)   PRN Meds: sodium chloride, acetaminophen, albuterol, ALPRAZolam, carbidopa-levodopa, nitroGLYCERIN, ondansetron (ZOFRAN) IV, sodium chloride flush, zolpidem   Vital Signs    Vitals:   11/01/16 0600 11/01/16 0700 11/01/16 0723 11/01/16 0724  BP: 132/64   (!) 152/66  Pulse: 62 66  68  Resp: 14 17    Temp:   (!) 97.5 F (36.4 C)   TempSrc:   Oral   SpO2: 95% 96%    Weight:      Height:        Intake/Output Summary (Last 24 hours) at 11/01/16 0914 Last data filed at 11/01/16 0644  Gross per 24 hour  Intake           570.91 ml  Output             1150 ml  Net          -579.09 ml   Filed Weights   10/28/16 0853  Weight: 260 lb (117.9 kg)    Physical Exam    GEN: Well nourished, well developed, in no acute distress.  HEENT: Grossly normal.  Neck: Supple, no JVD, carotid bruits, or masses. Cardiac: RRR, no murmurs, rubs, or gallops. No clubbing, cyanosis, edema.  Radials/DP/PT 2+ and equal bilaterally. Right radial cath site without bleeding, bruising, swelling, erythema, or TTP. Radial pulse 2+. Respiratory:  Respirations regular and unlabored, clear to auscultation bilaterally. GI: Soft, nontender, nondistended, BS +  x 4. MS: no deformity or atrophy. Skin: warm and dry, no rash. Neuro:  Strength and sensation are intact. Psych: AAOx3.  Normal affect.  Labs    CBC  Recent Labs  10/31/16 0523 11/01/16 0357  WBC 8.9 10.0  HGB 16.5 14.8  HCT 50.1 44.8  MCV 92.4 91.1  PLT 106* 938*   Basic Metabolic Panel  Recent Labs  10/31/16 0523 11/01/16 0357  NA  --  139  K  --  3.6  CL  --  104  CO2  --  26  GLUCOSE  --  111*  BUN  --  20  CREATININE 1.18 1.24  CALCIUM  --  8.7*   Liver Function Tests No results for input(s): AST, ALT, ALKPHOS, BILITOT, PROT, ALBUMIN in the last 72 hours. No results for input(s): LIPASE, AMYLASE in the last 72 hours. Cardiac Enzymes No results for input(s): CKTOTAL, CKMB, CKMBINDEX, TROPONINI in the  last 72 hours. BNP Invalid input(s): POCBNP D-Dimer No results for input(s): DDIMER in the last 72 hours. Hemoglobin A1C No results for input(s): HGBA1C in the last 72 hours. Fasting Lipid Panel No results for input(s): CHOL, HDL, LDLCALC, TRIG, CHOLHDL, LDLDIRECT in the last 72 hours. Thyroid Function Tests No results for input(s): TSH, T4TOTAL, T3FREE, THYROIDAB in the last 72 hours.  Invalid input(s): FREET3  Telemetry    NSR, 70s bpm, frequent PVCs and PJCs - Personally Reviewed  ECG    NSR, 61 bpm, 1st degree AV block, left axis deviation, nonspecific lateral st/t changes - Personally Reviewed  Radiology    No results found.  Cardiac Studies   TTE 10/29/2016: Study Conclusions  - Procedure narrative: Transthoracic echocardiography. Image   quality was poor. The study was technically difficult, as a   result of poor sound wave transmission. - Left ventricle: The cavity size was normal. Wall thickness was   increased in a pattern of mild LVH. Systolic function was normal.   The estimated ejection fraction was in the range of 55% to 60%.   Doppler parameters are consistent with abnormal left ventricular   relaxation (grade 1 diastolic dysfunction). Doppler parameters   are consistent with high ventricular filling pressure. - Aortic valve: Trileaflet; moderately thickened, mildly calcified   leaflets. Noncoronary cusp mobility was restricted. Sclerosis   without stenosis. There was no significant regurgitation. - Left atrium: The atrium was severely dilated. - Right ventricle: The cavity size was normal. Wall thickness was   mildly increased. Systolic function was normal.  LHC 10/31/2016: Coronary Findings   Dominance: Right  Left Main  LM lesion, 40% stenosed.  Left Anterior Descending  Ost LAD lesion, 100% stenosed.  Ramus Intermedius  Ost Ramus to Ramus lesion, 80% stenosed.  Ramus lesion, 80% stenosed.  Left Circumflex  Second Obtuse Marginal Branch    Vessel is angiographically normal.  Third Obtuse Marginal Branch  Vessel is angiographically normal.  Right Coronary Artery  Prox RCA to Mid RCA lesion, 95% stenosed. The lesion is type C and located at the major branch.  Angioplasty: Lesion crossed with guidewire. Pre-stent angioplasty was performed. A drug eluting stent was successfully placed. The pre-interventional distal flow is normal (TIMI 3). The post-interventional distal flow is normal (TIMI 3). The intervention was successful .  There is no residual stenosis post intervention.  Mid RCA lesion, 40% stenosed.  Right Posterior Atrioventricular Branch  There is mild disease in the vessel.  First Right Posterolateral  The vessel exhibits minimal luminal irregularities.  Second  Right Posterolateral  The vessel exhibits minimal luminal irregularities.  Third Right Posterolateral  The vessel exhibits minimal luminal irregularities.  Graft Angiography  Free LIMA Graft to Dist LAD  LIMA graft was visualized by non-selective angiography and is normal in caliber and anatomically normal.  Wall Motion              Left Heart   Left Ventricle The left ventricular size is normal. The left ventricular systolic function is normal. LV end diastolic pressure is mildly elevated. The left ventricular ejection fraction is 50-55% by visual estimate. No regional wall motion abnormalities.    Coronary Diagrams   Diagnostic Diagram       Post-Intervention Diagram          Patient Profile     81 y.o. male with history of CAD-CABG 1 (LIMA-LAD in 1998 at [facility]),HFpEF, PAF s/p DCCV on Eliquis, hypertensive heart disease, PE, Parkinson's disease, and morbid obesity who was admitted to [facility] with ACS/mild non-STEMI (borderline troponin elevation) on July 13 based on labs drawn on July 12.  Assessment & Plan    1. NSTEMI/CAD s/p CABG: -No further chest pain -Will be on triple therapy for at least the next 30 days, after which he may be  able to stop ASA and continue Plavix with Eliquis. Will defer this change to interventional cardiology -Continue Coreg, Imdur, Lipitor -Ok to transfer from ICU -Ambulate to assess for chest pain -Cardiac rehab  2. PAF: -Remains in NSR -Restart Eliquis today -Continue amiodarone and Coreg -CHADS2VASc at least 5 (CHF, HTN, age x 2, vascular disease)  Parkinson's disease: -Per IM  4. HFpEF: -He does not appear to be volume overloaded -Continue Coreg -Add losartan for afterload reduction  Signed, Christell Faith, PA-C [place] Pager: [phone] 11/01/2016, 9:14 AM

## 2016-11-01 NOTE — Significant Event (Signed)
Discussed with cardiology. They have no objection to transfer out of ICU/SDU. I have placed orders for transfer to telemetry floor. No formal consultation has been performed.  Merton Border, MD PCCM service Mobile [phone] Pager [phone] 11/01/2016 2:34 PM

## 2016-11-01 NOTE — Progress Notes (Signed)
Attempted to place patient on CPAP as ordered.  The Patient refused and stated that the did not need it tonight.  CPAP was left at bedside.

## 2016-11-01 NOTE — Telephone Encounter (Signed)
FYI

## 2016-11-02 ENCOUNTER — Telehealth: Payer: Self-pay | Admitting: *Deleted

## 2016-11-02 DIAGNOSIS — E785 Hyperlipidemia, unspecified: Secondary | ICD-10-CM

## 2016-11-02 DIAGNOSIS — I25119 Atherosclerotic heart disease of native coronary artery with unspecified angina pectoris: Secondary | ICD-10-CM

## 2016-11-02 DIAGNOSIS — I214 Non-ST elevation (NSTEMI) myocardial infarction: Principal | ICD-10-CM

## 2016-11-02 MED ORDER — ASPIRIN 81 MG PO TBEC: 81.0000 mg | DELAYED_RELEASE_TABLET | Freq: Every day | ORAL | 0 refills | Status: DC

## 2016-11-02 MED ORDER — CARVEDILOL 6.25 MG PO TABS: 6.2500 mg | ORAL_TABLET | Freq: Two times a day (BID) | ORAL | 0 refills | Status: DC

## 2016-11-02 MED ORDER — LOSARTAN POTASSIUM 25 MG PO TABS: 25.0000 mg | ORAL_TABLET | Freq: Every day | ORAL | 0 refills | Status: DC

## 2016-11-02 MED ORDER — CLOPIDOGREL BISULFATE 75 MG PO TABS: 75.0000 mg | ORAL_TABLET | Freq: Every day | ORAL | 0 refills | Status: DC

## 2016-11-02 MED ORDER — ISOSORBIDE MONONITRATE ER 30 MG PO TB24: 30.0000 mg | ORAL_TABLET | Freq: Every day | ORAL | 0 refills | Status: DC

## 2016-11-02 NOTE — Progress Notes (Signed)
IV and tele removed from patient. Discharge instructions given to patient and wife. Verbalized understanding. No distress at this time. Wife is at bedside and will be transporting patient home.  

## 2016-11-02 NOTE — Progress Notes (Signed)
Patient Name: Andre [name] Date of Encounter: 11/02/2016  Primary Cardiologist: [facility] Problem List     Principal Problem:   NSTEMI (non-ST elevated myocardial infarction) [facility]) Active Problems:   PAF (paroxysmal atrial fibrillation) ([place])   Coronary artery disease involving native coronary artery of native heart with angina pectoris (HCC)   (HFpEF) heart failure with preserved ejection fraction (HCC)   Parkinson's disease ([place])   Hyperlipidemia with target LDL less than 70   Essential hypertension     Subjective   No chest pain or SOB. Ambulated without issues.   Inpatient Medications    Scheduled Meds: . amiodarone  200 mg Oral Daily  . apixaban  5 mg Oral BID  . aspirin EC  81 mg Oral Daily  . atorvastatin  40 mg Oral Daily  . busPIRone  7.5 mg Oral BID  . carbidopa-levodopa  2 tablet Oral TID WC  . carvedilol  6.25 mg Oral BID WC  . clopidogrel  75 mg Oral Q breakfast  . fluticasone  1 spray Each Nare Daily  . furosemide  20 mg Oral Daily  . isosorbide mononitrate  30 mg Oral Daily  . levothyroxine  50 mcg Oral QAC breakfast  . losartan  25 mg Oral Daily  . mirabegron ER  50 mg Oral Daily  . pantoprazole  40 mg Oral Daily  . sodium chloride flush  3 mL Intravenous Q12H   Continuous Infusions: . sodium chloride    . nitroGLYCERIN Stopped (10/31/16 1500)   PRN Meds: sodium chloride, acetaminophen, albuterol, ALPRAZolam, carbidopa-levodopa, nitroGLYCERIN, ondansetron (ZOFRAN) IV, sodium chloride flush, zolpidem   Vital Signs    Vitals:   11/01/16 1800 11/01/16 1945 11/01/16 2300 11/02/16 0552  BP: 98/61  (!) 172/72 119/70  Pulse: 60  70 64  Resp: 13  18 20   Temp:  98.6 F (37 C) 98 F (36.7 C) 97.8 F (36.6 C)  TempSrc:  Oral Oral Oral  SpO2: 93%  97% 95%  Weight:    255 lb 8 oz (115.9 kg)  Height:        Intake/Output Summary (Last 24 hours) at 11/02/16 0728 Last data filed at 11/02/16 0300  Gross per 24 hour  Intake               240 ml  Output              300 ml  Net              -60 ml   Filed Weights   10/28/16 0853 11/02/16 0552  Weight: 260 lb (117.9 kg) 255 lb 8 oz (115.9 kg)    Physical Exam    GEN: Well nourished, well developed, in no acute distress.  HEENT: Grossly normal.  Neck: Supple, no JVD, carotid bruits, or masses. Cardiac: RRR, no murmurs, rubs, or gallops. No clubbing, cyanosis, edema.  Radials/DP/PT 2+ and equal bilaterally. Right radial cath site without bleeding, bruising, swelling, erythema, or TTP. Radial pulse 2+. Respiratory:  Respirations regular and unlabored, clear to auscultation bilaterally. GI: Soft, nontender, nondistended, BS + x 4. MS: no deformity or atrophy. Skin: warm and dry, no rash. Neuro:  Strength and sensation are intact. Psych: AAOx3.  Normal affect.  Labs    CBC  Recent Labs  10/31/16 0523 11/01/16 0357  WBC 8.9 10.0  HGB 16.5 14.8  HCT 50.1 44.8  MCV 92.4 91.1  PLT 106* 737*   Basic Metabolic Panel  Recent  Labs  10/31/16 0523 11/01/16 0357  NA  --  139  K  --  3.6  CL  --  104  CO2  --  26  GLUCOSE  --  111*  BUN  --  20  CREATININE 1.18 1.24  CALCIUM  --  8.7*   Liver Function Tests No results for input(s): AST, ALT, ALKPHOS, BILITOT, PROT, ALBUMIN in the last 72 hours. No results for input(s): LIPASE, AMYLASE in the last 72 hours. Cardiac Enzymes No results for input(s): CKTOTAL, CKMB, CKMBINDEX, TROPONINI in the last 72 hours. BNP Invalid input(s): POCBNP D-Dimer No results for input(s): DDIMER in the last 72 hours. Hemoglobin A1C No results for input(s): HGBA1C in the last 72 hours. Fasting Lipid Panel No results for input(s): CHOL, HDL, LDLCALC, TRIG, CHOLHDL, LDLDIRECT in the last 72 hours. Thyroid Function Tests No results for input(s): TSH, T4TOTAL, T3FREE, THYROIDAB in the last 72 hours.  Invalid input(s): FREET3  Telemetry    NSR, frequent PACs, rare PVC - Personally Reviewed  ECG    n/a - Personally  Reviewed  Radiology    No results found.  Cardiac Studies   TTE 10/29/2016: Study Conclusions  - Procedure narrative: Transthoracic echocardiography. Image quality was poor. The study was technically difficult, as a result of poor sound wave transmission. - Left ventricle: The cavity size was normal. Wall thickness was increased in a pattern of mild LVH. Systolic function was normal. The estimated ejection fraction was in the range of 55% to 60%. Doppler parameters are consistent with abnormal left ventricular relaxation (grade 1 diastolic dysfunction). Doppler parameters are consistent with high ventricular filling pressure. - Aortic valve: Trileaflet; moderately thickened, mildly calcified leaflets. Noncoronary cusp mobility was restricted. Sclerosis without stenosis. There was no significant regurgitation. - Left atrium: The atrium was severely dilated. - Right ventricle: The cavity size was normal. Wall thickness was mildly increased. Systolic function was normal.  LHC 10/31/2016: Coronary Findings   Dominance: Right  Left Main  LM lesion, 40% stenosed.  Left Anterior Descending  Ost LAD lesion, 100% stenosed.  Ramus Intermedius  Ost Ramus to Ramus lesion, 80% stenosed.  Ramus lesion, 80% stenosed.  Left Circumflex  Second Obtuse Marginal Branch  Vessel is angiographically normal.  Third Obtuse Marginal Branch  Vessel is angiographically normal.  Right Coronary Artery  Prox RCA to Mid RCA lesion, 95% stenosed. The lesion is type C and located at the major branch.  Angioplasty: Lesion crossed with guidewire. Pre-stent angioplasty was performed. A drug eluting stent was successfully placed. The pre-interventional distal flow is normal (TIMI 3). The post-interventional distal flow is normal (TIMI 3). The intervention was successful .  There is no residual stenosis post intervention.  Mid RCA lesion, 40% stenosed.  Right Posterior Atrioventricular  Branch  There is mild disease in the vessel.  First Right Posterolateral  The vessel exhibits minimal luminal irregularities.  Second Right Posterolateral  The vessel exhibits minimal luminal irregularities.  Third Right Posterolateral  The vessel exhibits minimal luminal irregularities.  Graft Angiography  Free LIMA Graft to Dist LAD  LIMA graft was visualized by non-selective angiography and is normal in caliber and anatomically normal.  Wall Motion              Left Heart   Left Ventricle The left ventricular size is normal. The left ventricular systolic function is normal. LV end diastolic pressure is mildly elevated. The left ventricular ejection fraction is 50-55% by visual estimate. No regional wall motion  abnormalities.    Coronary Diagrams   Diagnostic Diagram       Post-Intervention Diagram          Patient Profile     81 y.o. male with history of CAD-CABG 1 (LIMA-LAD in 1998 at [facility]),HFpEF, PAF s/p DCCV on Eliquis, hypertensive heart disease, PE, Parkinson's disease, and morbid obesity who was admitted to [facility] with ACS/mild non-STEMI (borderline troponin elevation) on July 13 based on labs drawn on July 12.  Assessment & Plan    1. NSTEMI/CAD s/p CABG: -No further chest pain -Will be on triple therapy for at least the next 30 days, after which he will stop ASA and continue Plavix with Eliquis. Will defer this change to interventional cardiology -Continue Coreg, Imdur, Lipitor -Cardiac rehab  2. PAF: -Remains in NSR -Continue Eliquis 5 mg bid -Continue amiodarone and Coreg -CHADS2VASc at least 5 (CHF, HTN, age x 2, vascular disease)  Parkinson's disease: -Per IM  4. HFpEF: -He does not appear to be volume overloaded -Continue Coreg -Continue losartan for afterload reduction  Signed, Christell Faith, PA-C [place] Pager: [phone] 11/02/2016, 7:28 AM   Attending Note Patient seen and examined, agree with detailed note  above,  Patient presentation and plan discussed on rounds.   On rounds this morning, no significant chest pain or shortness of breath on exertion Cardiac catheterization results and stent placement details discussed with him Stent placed to his RCA, residual disease in the ramus Graft is patent to the LAD  He has ambulated without any significant symptoms, or no significant pain in the wrists/catheterization entry site right radial artery  On physical exam obese, no distress, lungs are clear to auscultation bilaterally, no JVP, heart sounds regular with normal S1-S2 no murmurs appreciated, abdomen distended, nontender, No significant lower extremity edema  Labs reviewed showing normal BMP, CBC  ----NSTEMI/CAD Discussion concerning his medications Triple therapy for one month then we will hold the aspirin Eliquis, plavix and asa for the first 30 days Continue beta blocker, statin He will follow-up in clinic  For atrial fibrillation will restart anticoagulation as above  Greater than 50% was spent in counseling and coordination of care with patient Total encounter time 25 minutes or more   Signed: Esmond Plants  M.D., Ph.D. [facility]

## 2016-11-02 NOTE — Telephone Encounter (Signed)
Patient contacted regarding discharge from [facility] on 11/02/16.  Patient understands to follow up with provider Ignacia Bayley NP & Dr. Caryl Comes on 11/03/16 at 09:30 AM at [facility]. Patient understands discharge instructions? Yes Patient understands medications and regiment? Yes Patient understands to bring all medications to this visit? Yes  Patients wife verbalized understanding of all instructions and confirmed appointments for tomorrow with no further questions at this time.

## 2016-11-02 NOTE — Telephone Encounter (Signed)
-----   Message from Blain Pais sent at 11/02/2016  9:23 AM EDT ----- Regarding: tcm/ph 11/03/2016 9:30 Murray Hodgkins, NP

## 2016-11-02 NOTE — Care Management (Signed)
Have requested phase II cardiac rehab order.  NO other dc needs identified by members of care team

## 2016-11-02 NOTE — Progress Notes (Signed)
Patient transferred from the CCU following a vascath through the right radial. On admission to the unit patient was ambulatory with 1 assist, accompanied by wife. Right wrist vascular site look clean without hematoma or bleeding. Patient denied pain or being in any discomfort, NSR with stable VS. Wife was at bed site overnight. Patient has no acute event , he rested for most of the night.

## 2016-11-03 ENCOUNTER — Encounter: Payer: Self-pay | Admitting: Internal Medicine

## 2016-11-03 ENCOUNTER — Encounter: Payer: PPO | Admitting: Nurse Practitioner

## 2016-11-03 ENCOUNTER — Ambulatory Visit (INDEPENDENT_AMBULATORY_CARE_PROVIDER_SITE_OTHER): Payer: PPO | Admitting: Internal Medicine

## 2016-11-03 ENCOUNTER — Encounter: Payer: Self-pay | Admitting: Nurse Practitioner

## 2016-11-03 VITALS — BP 150/80 | HR 64 | Ht 66.0 in | Wt 256.2 lb

## 2016-11-03 DIAGNOSIS — I5030 Unspecified diastolic (congestive) heart failure: Secondary | ICD-10-CM | POA: Diagnosis not present

## 2016-11-03 DIAGNOSIS — I482 Chronic atrial fibrillation, unspecified: Secondary | ICD-10-CM

## 2016-11-03 DIAGNOSIS — Z79899 Other long term (current) drug therapy: Secondary | ICD-10-CM

## 2016-11-03 NOTE — Progress Notes (Signed)
Patient Care Team: Coral Spikes, DO as PCP - General (Family Medicine)   HPI  Andre [name] is a 81 y.o. male Seen in follow-up for  atrial ibrillation. It had been unassociated with palpitations. He does undergone cardioversion 3/17 with rapid reversion and was subsequently treated with amiodarone. He has continued on this. Cardioversion done 6/17   He has hx of CAD and prior CABG  Sinus bradycardia has prompted down titration of rate controlling medications Echocardiogram 2/17 demonstrated normal left ventricular function mild left ventricular hypertrophy (1.3/1 cm) and left atrial enlargement  (5.0 cm  Echo 7/18 nl LV function with LAE (52/2.2/5)  7/18 nSTEMI >>LHC --LIMA patent    RCA 95>>0 DES    Ramus 80    LVEF 55%   )  Date TSH LFTs PFTs  8/17 6.0 16    7/18  3.31     Date Cr Hgb  7/18 1.24 14.8    Tolerating amiodarone. There is mild sun sensitivity but no cough  Dyspnea is much improved following.  Non-STEMI presented with left arm pain and worsening dyspnea on exertion. He has had no further.  Past Medical History:  Diagnosis Date  . Cervical spondylosis 10/01/2013  . Chronic diastolic CHF (congestive heart failure) ([place])    a. 07/2016 Echo: >55%.  . Coronary artery disease    a. 1998 s/p mini-cabg @ [facility] - pt reports one vessel bypass to LAD;  b. 07/2016 St Echo:  Inadequate HR (max 97) w/ hypertensive response (220/96). Ex time only 2:54 - stopped due to dyspnea and leg pain;  c.  08/2016 MV: EF 67%, no ischemia, low risk.  . Depression   . GERD (gastroesophageal reflux disease)   . Hyperlipidemia   . Hypertension   . PAF (paroxysmal atrial fibrillation) (HCC)    a. s/p DCCV-->maintaining sinus on amiodarone;  b. CHA2DS2VASc = 5-->eliquis.  . Parkinson's disease ([place])    tremors  . Pulmonary embolism ([place]) 2011  . 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 Sleep apnea    wears CPAP    Past Surgical History:  Procedure Laterality Date  . BACK  SURGERY  1960  . CARDIAC CATHETERIZATION    . CHOLECYSTECTOMY  2010  . CORONARY ARTERY BYPASS GRAFT  01/07/1997  . CORONARY STENT INTERVENTION N/A 10/31/2016   Procedure: Coronary Stent Intervention;  Surgeon: Wellington Hampshire, MD;  Location: [facility] [facility];  Service: Cardiovascular;  Laterality: N/A;  . ELECTROPHYSIOLOGIC STUDY N/A 07/14/2015   Procedure: CARDIOVERSION;  Surgeon: Yolonda Kida, MD;  Location: [facility];  Service: Cardiovascular;  Laterality: N/A;  . ELECTROPHYSIOLOGIC STUDY N/A 10/12/2015   Procedure: CARDIOVERSION;  Surgeon: Minna Merritts, MD;  Location: [facility];  Service: Cardiovascular;  Laterality: N/A;  . LEFT HEART CATH AND CORONARY ANGIOGRAPHY N/A 10/31/2016   Procedure: Left Heart Cath and Coronary Angiography;  Surgeon: Wellington Hampshire, MD;  Location: [facility];  Service: Cardiovascular;  Laterality: N/A;  . OTHER SURGICAL HISTORY  1998   Bypass    Current Outpatient Prescriptions  Medication Sig Dispense Refill  . albuterol (VENTOLIN HFA) 108 (90 BASE) MCG/ACT inhaler Inhale 2 puffs into the lungs every 4 (four) hours as needed for wheezing.    [name] amiodarone (PACERONE) 200 MG tablet Take 200 mg by mouth daily.     [name] apixaban (ELIQUIS) 5 MG TABS tablet Take 5 mg by mouth 2 (two) times daily. Reported on 07/31/2015    .  aspirin EC 81 MG EC tablet Take 1 tablet (81 mg total) by mouth daily. 30 tablet 0  . atorvastatin (LIPITOR) 40 MG tablet Take 1 tablet (40 mg total) by mouth daily. 90 tablet 3  . busPIRone (BUSPAR) 7.5 MG tablet Take 7.5 mg by mouth 2 (two) times daily.    . carbidopa-levodopa (PARCOPA) 25-250 MG per disintegrating tablet Take 2 tablets by mouth 3 (three) times daily. Reported on 07/14/2015    . carvedilol (COREG) 6.25 MG tablet Take 1 tablet (6.25 mg total) by mouth 2 (two) times daily with a meal. 30 tablet 0  . clopidogrel (PLAVIX) 75 MG tablet Take 1 tablet (75 mg total) by mouth daily with breakfast. 30 tablet 0  .  cyclobenzaprine (FLEXERIL) 5 MG tablet Take 5 mg by mouth daily.    [name] EPINEPHrine 0.3 mg/0.3 mL IJ SOAJ injection Inject 0.3 mg as directed once as needed (allergic reaction). Reported on 10/27/2015    . esomeprazole (NEXIUM) 20 MG capsule Take 40 mg by mouth daily at 12 noon.    . fluticasone (FLONASE) 50 MCG/ACT nasal spray TAKE 2 PUFFS IN EACH NOSTRIL EVERY DAY 16 g 3  . furosemide (LASIX) 40 MG tablet Take 1/2 tablet (20 mg) by mouth once every other day    . isosorbide mononitrate (IMDUR) 30 MG 24 hr tablet Take 1 tablet (30 mg total) by mouth daily. 30 tablet 0  . levothyroxine (SYNTHROID, LEVOTHROID) 50 MCG tablet 50 mcg daily before breakfast.     . losartan (COZAAR) 25 MG tablet Take 1 tablet (25 mg total) by mouth daily. 30 tablet 0  . mirabegron ER (MYRBETRIQ) 50 MG TB24 tablet Take 50 mg by mouth daily.     No current facility-administered medications for this visit.     Allergies  Allergen Reactions  . Pravastatin Other (See Comments)  . Prednisone Other (See Comments)    Pt states that med makes him hyper Pt states that med makes him hyper      Review of Systems negative except from HPI and PMH  Physical Exam BP (!) 150/80 (BP Location: Left Arm, Patient Position: Sitting, Cuff Size: Normal)   Pulse 64   Ht 5\' 6"  (1.676 m)   Wt 256 lb 4 oz (116.2 kg)   BMI 41.36 kg/m  Well developed and nourished in no acute distress HENT normal Neck supple   Carotids brisk and full without bruits Clear Regular rate and rhythm, no murmurs or gallops Abd-soft with active BS without hepatomegaly No Clubbing cyanosis edema Skin-warm and dry A & Oriented  Grossly normal sensory and motor function  ECG demonstrates sinus rhythm at 64 Intervals 21/10/43 Left axis deviation with probable IMI Poor R-wave progression   Assessment and Plan:  Morbid obesity and sleep apnea  HFpEF  Atrial fibrillation-persistent  Depression  Coronary artery disease with prior CABG and recent  stenting    He is currently on triple therapy. This will persist for one month. No bleeding at this point.  He is tolerating the amiodarone without interval atrial fibrillation.  Breathing status is improved.

## 2016-11-03 NOTE — Patient Instructions (Signed)
Medication Instructions: - Your physician recommends that you continue on your current medications as directed. Please refer to the Current Medication list given to you today.  Labwork: - none ordered  Procedures/Testing: - none ordered  Follow-Up: - Your physician recommends that you schedule a follow-up appointment in: 3 months with Ignacia Bayley, NP  - Your physician wants you to follow-up in: 6 months with Dr. Rockey Situ. You will receive a reminder letter in the mail two months in advance. If you don't receive a letter, please call our office to schedule the follow-up appointment.   Any Additional Special Instructions Will Be Listed Below (If Applicable).     If you need a refill on your cardiac medications before your next appointment, please call your pharmacy.

## 2016-11-06 NOTE — Discharge Summary (Addendum)
Andre [name], is a 81 y.o. male  DOB 1936/03/16  MRN [id number].  Admission date:  10/28/2016  Admitting Physician  Demetrios Loll, MD  Discharge Date:  11/02/2016   Primary MD  Coral Spikes, DO  Recommendations for primary care physician for things to follow:   Follow-up with PCP in 1 week.  Admission Diagnosis  Chest discomfort [R07.89] Elevated troponin [R74.8]   Discharge Diagnosis  Chest discomfort [R07.89] Elevated troponin [R74.8]    Principal Problem:   NSTEMI (non-ST elevated myocardial infarction) ([place]) Active Problems:   PAF (paroxysmal atrial fibrillation) (HCC)   Parkinson's disease ([place])   Coronary artery disease involving native coronary artery of native heart with angina pectoris ([place])   Hyperlipidemia with target LDL less than 70   Essential hypertension   (HFpEF) heart failure with preserved ejection fraction [facility])      Past Medical History:  Diagnosis Date  . Cervical spondylosis 10/01/2013  . Chronic diastolic CHF (congestive heart failure) ([place])    a. 07/2016 Echo: >55%.  . Coronary artery disease    a. 1998 s/p mini-cabg @ [facility] - pt reports one vessel bypass to LAD;  b. 07/2016 St Echo:  Inadequate HR (max 97) w/ hypertensive response (220/96). Ex time only 2:54 - stopped due to dyspnea and leg pain;  c.  08/2016 MV: EF 67%, no ischemia, low risk.  . Depression   . GERD (gastroesophageal reflux disease)   . Hyperlipidemia   . Hypertension   . PAF (paroxysmal atrial fibrillation) (HCC)    a. s/p DCCV-->maintaining sinus on amiodarone;  b. CHA2DS2VASc = 5-->eliquis.  . Parkinson's disease ([place])    tremors  . Pulmonary embolism ([place]) 2011  . 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 Sleep apnea    wears CPAP    Past Surgical History:  Procedure Laterality Date  . BACK SURGERY  1960  . CARDIAC  CATHETERIZATION    . CHOLECYSTECTOMY  2010  . CORONARY ARTERY BYPASS GRAFT  01/07/1997  . CORONARY STENT INTERVENTION N/A 10/31/2016   Procedure: Coronary Stent Intervention;  Surgeon: Wellington Hampshire, MD;  Location: [place] [facility];  Service: Cardiovascular;  Laterality: N/A;  . ELECTROPHYSIOLOGIC STUDY N/A 07/14/2015   Procedure: CARDIOVERSION;  Surgeon: Yolonda Kida, MD;  Location: [facility];  Service: Cardiovascular;  Laterality: N/A;  . ELECTROPHYSIOLOGIC STUDY N/A 10/12/2015   Procedure: CARDIOVERSION;  Surgeon: Minna Merritts, MD;  Location: [facility];  Service: Cardiovascular;  Laterality: N/A;  . LEFT HEART CATH AND CORONARY ANGIOGRAPHY N/A 10/31/2016   Procedure: Left Heart Cath and Coronary Angiography;  Surgeon: Wellington Hampshire, MD;  Location: [facility] [facility];  Service: Cardiovascular;  Laterality: N/A;  . OTHER SURGICAL HISTORY  1998   Bypass       History of present illness and  Hospital Course:     Kindly see H&P for history of present illness and admission details, please review complete Labs, Consult reports and Test reports for all details in brief  HPI  from the history and physical done on the day of admission  Came in because of chest pain,, found to have elevated troponins. Admitted to telemetry for chest pain, non-ST elevation MI. Hospital Course  Chest pain with non-ST elevation MI status post cardiac catheter showing three-vessel disease with occluded LAD, severe proximal RCA stenosis with heavy thrombus.patent graft to LAD>. Patient had successful drug-eluting stent in proximal RCA. After that patient had embolization into large RV branch, balloon angioplasty was  done. Patient received nitroglycerin drip for chest pain control, monitored in ICU. Patient remained chest pain-free after the procedure, seen by cardiology from [facility], discharged home with triple antiplatelet therapy for 1 month, he needs to be on aspirin, Plavix, and Eliquis. for 1  month after that aspirin can be stopped. Explained to the patient in detail about this. And patient can continue Coreg, Imdur, Lipitor, and referred to cardiac rehabilitation.  2.PAF;Remains in NSR -Continue Eliquis 5 mg bid -Continue amiodarone and Coreg -CHADS2VASc at least 5 (CHF, HTN, age x 2, vascular disease)  3.HFpEF: -He does not appear to be volume overloaded -Continue Coreg -Continue losartan for afterload reduction  Discharge Condition: stable   Follow UP  Follow-up Information    Coral Spikes, DO. Go on 11/08/2016.   Specialty:  Family Medicine Why:  Appointment Time: 11:15am Contact information: [place] [address] [place] [address] [phone]        Minna Merritts, MD. Go on 11/03/2016.   Specialty:  Cardiology Why:  please keep and go to appointment already scheduled for tomorrow Thurday November 03, 2016 at 9:30am with Ignacia Bayley, NP Contact information: [place] [address] [phone]             Discharge Instructions  and  Discharge Medications     Discharge Instructions    AMB Referral to Cardiac Rehabilitation - Phase II    Complete by:  As directed    Diagnosis:   Coronary Stents NSTEMI     Amb Referral to Cardiac Rehabilitation    Complete by:  As directed    Diagnosis:  NSTEMI     Allergies as of 11/02/2016      Reactions   Pravastatin Other (See Comments)   Prednisone Other (See Comments)   Pt states that med makes him hyper Pt states that med makes him hyper      Medication List    STOP taking these medications   verapamil 240 MG CR tablet Commonly known as:  CALAN-SR     TAKE these medications   amiodarone 200 MG tablet Commonly known as:  PACERONE Take 200 mg by mouth daily.   aspirin 81 MG EC tablet Take 1 tablet (81 mg total) by mouth daily.   atorvastatin 40 MG tablet Commonly known as:  LIPITOR Take 1 tablet (40 mg total) by mouth daily.   busPIRone 7.5 MG  tablet Commonly known as:  BUSPAR Take 7.5 mg by mouth 2 (two) times daily.   carbidopa-levodopa 25-250 MG disintegrating tablet Commonly known as:  PARCOPA Take 2 tablets by mouth 3 (three) times daily. Reported on 07/14/2015   carvedilol 6.25 MG tablet Commonly known as:  COREG Take 1 tablet (6.25 mg total) by mouth 2 (two) times daily with a meal.   clopidogrel 75 MG tablet Commonly known as:  PLAVIX Take 1 tablet (75 mg total) by mouth daily with breakfast.   cyclobenzaprine 5 MG tablet Commonly known as:  FLEXERIL Take 5 mg by mouth daily.   ELIQUIS 5 MG Tabs tablet Generic drug:  apixaban Take 5 mg by mouth 2 (two) times daily. Reported on 07/31/2015   EPINEPHrine 0.3 mg/0.3 mL Soaj injection Commonly known as:  EPI-PEN Inject 0.3 mg as directed once as needed (allergic reaction). Reported on 10/27/2015   esomeprazole 20 MG capsule Commonly known as:  NEXIUM Take 40 mg by mouth daily at 12 noon.   fluticasone 50 MCG/ACT nasal spray  Commonly known as:  FLONASE TAKE 2 PUFFS IN EACH NOSTRIL EVERY DAY   furosemide 40 MG tablet Commonly known as:  LASIX Take 1/2 tablet (20 mg) by mouth once every other day   isosorbide mononitrate 30 MG 24 hr tablet Commonly known as:  IMDUR Take 1 tablet (30 mg total) by mouth daily.   levothyroxine 50 MCG tablet Commonly known as:  SYNTHROID, LEVOTHROID 50 mcg daily before breakfast.   losartan 25 MG tablet Commonly known as:  COZAAR Take 1 tablet (25 mg total) by mouth daily.   MYRBETRIQ 50 MG Tb24 tablet Generic drug:  mirabegron ER Take 50 mg by mouth daily.   VENTOLIN HFA 108 (90 Base) MCG/ACT inhaler Generic drug:  albuterol Inhale 2 puffs into the lungs every 4 (four) hours as needed for wheezing.         Diet and Activity recommendation: See Discharge Instructions above   Consults obtained - cardiology   Major procedures and Radiology Reports - PLEASE review detailed and final reports for all details, in  brief -      No results found.  Micro Result   Recent Results (from the past 240 hour(s))  MRSA PCR Screening     Status: None   Collection Time: 10/31/16  1:11 PM  Result Value Ref Range Status   MRSA by PCR NEGATIVE NEGATIVE Final    Comment:        The GeneXpert MRSA Assay (FDA approved for NASAL specimens only), is one component of a comprehensive MRSA colonization surveillance program. It is not intended to diagnose MRSA infection nor to guide or monitor treatment for MRSA infections.        Today   Subjective:   [place] Haub today has no headache,no chest abdominal pain,no new weakness tingling or numbness, feels much better wants to go home today.   Objective:   Blood pressure (!) 155/78, pulse 65, temperature 98.3 F (36.8 C), temperature source Oral, resp. rate 17, height 5\' 6"  (1.676 m), weight 115.9 kg (255 lb 8 oz), SpO2 96 %.  No intake or output data in the 24 hours ending 11/06/16 1305  Exam Awake Alert, Oriented x 3, No new F.N deficits, Normal affect [place].AT,PERRAL Supple Neck,No JVD, No cervical lymphadenopathy appriciated.  Symmetrical Chest wall movement, Good air movement bilaterally, CTAB RRR,No Gallops,Rubs or new Murmurs, No Parasternal Heave +ve B.Sounds, Abd Soft, Non tender, No organomegaly appriciated, No rebound -guarding or rigidity. No Cyanosis, Clubbing or edema, No new Rash or bruise  Data Review   CBC w Diff:  Lab Results  Component Value Date   WBC 10.0 11/01/2016   HGB 14.8 11/01/2016   HGB 14.9 10/06/2015   HCT 44.8 11/01/2016   HCT 46.4 10/06/2015   PLT 108 (L) 11/01/2016   PLT 142 (L) 10/06/2015   LYMPHOPCT 23 10/28/2016   LYMPHOPCT 21.1 08/12/2014   MONOPCT 9 10/28/2016   MONOPCT 10.7 08/12/2014   EOSPCT 1 10/28/2016   EOSPCT 1.0 08/12/2014   BASOPCT 1 10/28/2016   BASOPCT 0.8 08/12/2014    CMP:  Lab Results  Component Value Date   NA 139 11/01/2016   NA 139 01/19/2016   K 3.6 11/01/2016   CL 104  11/01/2016   CO2 26 11/01/2016   BUN 20 11/01/2016   BUN 20 01/19/2016   CREATININE 1.24 11/01/2016   CREATININE 0.95 12/06/2011   PROT 7.3 01/29/2016   PROT 7.0 12/03/2015   PROT 7.6 09/20/2011   ALBUMIN 3.9 01/29/2016  ALBUMIN 4.2 12/03/2015   ALBUMIN 3.7 09/20/2011   BILITOT 1.0 01/29/2016   BILITOT 0.8 12/03/2015   BILITOT 0.5 09/20/2011   ALKPHOS 102 01/29/2016   ALKPHOS 133 09/20/2011   AST 23 01/29/2016   AST 18 09/20/2011   ALT 13 (L) 01/29/2016   ALT 23 09/20/2011  .   Total Time in preparing paper work, data evaluation and todays exam - 37 minutes  Shakena Callari M.D on 11/02/2016 at 1:05 PM    Note: This dictation was prepared with Dragon dictation along with smaller phrase technology. Any transcriptional errors that result from this process are unintentional.

## 2016-11-08 ENCOUNTER — Encounter: Payer: Self-pay | Admitting: Family Medicine

## 2016-11-08 ENCOUNTER — Ambulatory Visit (INDEPENDENT_AMBULATORY_CARE_PROVIDER_SITE_OTHER): Payer: PPO | Admitting: Family Medicine

## 2016-11-08 DIAGNOSIS — I25119 Atherosclerotic heart disease of native coronary artery with unspecified angina pectoris: Secondary | ICD-10-CM

## 2016-11-08 DIAGNOSIS — I5030 Unspecified diastolic (congestive) heart failure: Secondary | ICD-10-CM | POA: Diagnosis not present

## 2016-11-08 DIAGNOSIS — I480 Paroxysmal atrial fibrillation: Secondary | ICD-10-CM

## 2016-11-08 NOTE — Assessment & Plan Note (Signed)
Stable. Euvolemic today. Continue current medications.

## 2016-11-08 NOTE — Progress Notes (Signed)
Subjective:  Patient ID: Andre [name], male    DOB: [birth year]  Age: 81 y.o. MRN: [id number]  CC: Hospital follow up  HPI:  81 year old male with CAD status post CABG, hypertension, heart failure with preserved ejection fraction, A. fib, OSA, morbid obesity, hypothyroidism, Parkinson's, hyperlipidemia presents for hospital follow-up.  Patient was recently admitted on 7/13 after reporting chest pain to me. EKG was unremarkable and troponin came back elevated and patient was sent to the ER. Patient was admitted and catheterization revealed occluded LAD and patent LIMA to LAD. Severe proximal RCA stenosis with heavy thrombus burden. Had angioplasty and drug-eluting stent of the proximal right coronary artery. Patient did well and was discharged home with some medication changes: Addition of losartan, discontinuation of verapamil, addition of carvedilol, triple therapy (aspirin, Plavix, eliquis) to continue for 1 month.  Patient presents today for follow-up. He states that he is doing well. He states that his chronic dyspnea seems to be improved following catheterization and stent placement. He states that he feels much better than he has in the recent past. Endorses compliance with medications. Will review medications with patient today. He and his wife are concerned about the triple therapy outlined above. Will discuss today. No reports of chest pain. Dyspnea improved. No other complaints or concerns at this time.  Afib - Stable. Currently on Eliquis, Coreg, Amiodarone.  CHF - Stable. No chest pain or SOB.  Social Hx   Social History   Social History  . Marital status: Married    Spouse name: [name]  . Number of children: 2  . Years of education: 12   Occupational History  . Retired     Designer, television/film set   Social History Main Topics  . Smoking status: Former Smoker    Packs/day: 1.00    Years: 40.00    Types: Cigarettes, Pipe, Cigars    Quit date: 04/18/1972  . Smokeless  tobacco: Former Systems developer    Types: Chew    Quit date: 04/18/1972  . Alcohol use 2.4 oz/week    2 Cans of beer, 2 Shots of liquor per week     Comment: per 2 weeks   . Drug use: No  . Sexual activity: Not Currently   Other Topics Concern  . None   Social History Narrative   Lives w/ wife   Caffeine use: none   Right-handed    Review of Systems  Respiratory: Negative for shortness of breath.   Cardiovascular: Negative for chest pain.   Objective:  BP (!) 158/64 (BP Location: Left Arm, Patient Position: Sitting, Cuff Size: Large)   Pulse 64   Temp 98.2 F (36.8 C) (Oral)   Resp 16   Wt 259 lb 6 oz (117.7 kg)   SpO2 95%   BMI 41.86 kg/m   BP/Weight 11/08/2016 11/03/2016 5/78/4696  Systolic BP 295 284 132  Diastolic BP 64 80 78  Wt. (Lbs) 259.38 256.25 255.5  BMI 41.86 41.36 41.24   Physical Exam  Constitutional: He is oriented to person, place, and time. He appears well-developed. No distress.  HENT:  Head: Normocephalic and atraumatic.  Eyes: Conjunctivae are normal. No scleral icterus.  Cardiovascular: Normal rate.   Regularly irregular. Likely secondary to ectopy.  Pulmonary/Chest: Effort normal. He has no wheezes. He has no rales.  Neurological: He is alert and oriented to person, place, and time.  Psychiatric: He has a normal mood and affect.  Vitals reviewed.   Lab Results  Component Value Date  WBC 10.0 11/01/2016   HGB 14.8 11/01/2016   HCT 44.8 11/01/2016   PLT 108 (L) 11/01/2016   GLUCOSE 111 (H) 11/01/2016   CHOL 183 10/29/2016   TRIG 229 (H) 10/29/2016   HDL 45 10/29/2016   LDLCALC 92 10/29/2016   ALT 13 (L) 01/29/2016   AST 23 01/29/2016   NA 139 11/01/2016   K 3.6 11/01/2016   CL 104 11/01/2016   CREATININE 1.24 11/01/2016   BUN 20 11/01/2016   CO2 26 11/01/2016   TSH 3.31 10/27/2016   INR 1.22 10/28/2016    Assessment & Plan:   Problem List Items Addressed This Visit      Cardiovascular and Mediastinum   (HFpEF) heart failure with  preserved ejection fraction (HCC) (Chronic)    Stable. Euvolemic today. Continue current medications.      Coronary artery disease involving native coronary artery of native heart with angina pectoris (HCC) (Chronic)    Recent NSTEMI. Doing well. Medications reviewed and reconciled today. Patient to continue triple therapy with aspirin, Plavix, and Eliquis for one month. Then stop aspirin. Will arrange close cardiology follow-up.      PAF (paroxysmal atrial fibrillation) (HCC) (Chronic)    Stable on Eliquis, amiodarone, and Coreg. No complaints this time. Dyspnea improved.        Follow-up: 3 months  [place]

## 2016-11-08 NOTE — Assessment & Plan Note (Signed)
Recent NSTEMI. Doing well. Medications reviewed and reconciled today. Patient to continue triple therapy with aspirin, Plavix, and Eliquis for one month. Then stop aspirin. Will arrange close cardiology follow-up.

## 2016-11-08 NOTE — Patient Instructions (Addendum)
Stop the aspirin after 1 month.  Continue your other meds.  I will take care of having you seen sooner by cardiology.  Follow up in 3 months.   Take care  Dr. Lacinda Axon

## 2016-11-08 NOTE — Assessment & Plan Note (Signed)
Stable on Eliquis, amiodarone, and Coreg. No complaints this time. Dyspnea improved.

## 2016-11-11 ENCOUNTER — Encounter: Payer: Self-pay | Admitting: Cardiovascular Disease

## 2016-11-11 ENCOUNTER — Ambulatory Visit (INDEPENDENT_AMBULATORY_CARE_PROVIDER_SITE_OTHER): Payer: PPO | Admitting: Cardiovascular Disease

## 2016-11-11 VITALS — BP 146/72 | HR 65 | Ht 66.5 in | Wt 259.0 lb

## 2016-11-11 DIAGNOSIS — I480 Paroxysmal atrial fibrillation: Secondary | ICD-10-CM

## 2016-11-11 DIAGNOSIS — E785 Hyperlipidemia, unspecified: Secondary | ICD-10-CM | POA: Diagnosis not present

## 2016-11-11 DIAGNOSIS — I214 Non-ST elevation (NSTEMI) myocardial infarction: Secondary | ICD-10-CM

## 2016-11-11 DIAGNOSIS — I10 Essential (primary) hypertension: Secondary | ICD-10-CM

## 2016-11-11 NOTE — Progress Notes (Signed)
Cardiology Office Note   Date:  11/11/2016   ID:  Andre [name], DOB [birth year], MRN [id number]  PCP:  Coral Spikes, DO  Cardiologist:   [name], MD   Chief Complaint  Patient presents with  . other    Est. care s/p cardiac cath & stent placement. Meds reviewed by the pt. verbally. Pt. c/o shortness of breath.       History of Present Illness: Andre [name] is a 81 y.o. male who presents for A follow-up visit after recent hospitalization with non-ST elevation myocardial infarction. He has known history of coronary artery disease status post CABG with LIMA to LAD, hypertension, hyperlipidemia, chronic diastolic heart aure, paroxysmal atrial fibrillation and morbid obesity. He also has history of pulmonary embolism and Parkinson's He was hospitalized at [facility] 2 weeks ago with a small non-ST elevation myocardial infarction. Echocardiogram showed normal LV systolic function with severely dilated left atrium. I proceeded with cardiac catheterization via the right radial artery which showed occluded native LAD with patent LIMA, severe proximal RCA stenosis with heavy thrombus and moderate distal left main stenosis with significant ostial stenosis in the ramus branch. EF was 50-55% with mildly elevated left ventricular end-diastolic pressure. I performed successful angioplasty and drug-eluting stent placement to the right coronary artery which was complicated by embolization into a large RV branch at the lesion site. This was treated with balloon angioplasty which established TIMI 1 flow. He has been doing well since his discharge with no recurrent chest pain or shortness of breath. He feels significantly better. 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  Past Medical History:  Diagnosis Date  . Cervical spondylosis 10/01/2013  . Chronic diastolic CHF (congestive heart failure) ([place])    a. 07/2016 Echo: >55%.  . Coronary artery disease    a. 1998 s/p mini-cabg @ [facility] - pt  reports one vessel bypass to LAD;  b. 07/2016 St Echo:  Inadequate HR (max 97) w/ hypertensive response (220/96). Ex time only 2:54 - stopped due to dyspnea and leg pain;  c.  08/2016 MV: EF 67%, no ischemia, low risk.  . Depression   . GERD (gastroesophageal reflux disease)   . Hyperlipidemia   . Hypertension   . PAF (paroxysmal atrial fibrillation) ([facility])    a. s/p DCCV-->maintaining sinus on amiodarone;  b. CHA2DS2VASc = 5-->eliquis.  . Parkinson's disease ([place])    tremors  . Pulmonary embolism ([place]) 2011  . 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 Sleep apnea    wears CPAP    Past Surgical History:  Procedure Laterality Date  . BACK SURGERY  1960  . CARDIAC CATHETERIZATION    . CHOLECYSTECTOMY  2010  . CORONARY ARTERY BYPASS GRAFT  01/07/1997  . CORONARY STENT INTERVENTION N/A 10/31/2016   Procedure: Coronary Stent Intervention;  Surgeon: Wellington Hampshire, MD;  Location: [facility];  Service: Cardiovascular;  Laterality: N/A;  . ELECTROPHYSIOLOGIC STUDY N/A 07/14/2015   Procedure: CARDIOVERSION;  Surgeon: Yolonda Kida, MD;  Location: [facility];  Service: Cardiovascular;  Laterality: N/A;  . ELECTROPHYSIOLOGIC STUDY N/A 10/12/2015   Procedure: CARDIOVERSION;  Surgeon: Minna Merritts, MD;  Location: [facility];  Service: Cardiovascular;  Laterality: N/A;  . LEFT HEART CATH AND CORONARY ANGIOGRAPHY N/A 10/31/2016   Procedure: Left Heart Cath and Coronary Angiography;  Surgeon: Wellington Hampshire, MD;  Location: [facility];  Service: Cardiovascular;  Laterality: N/A;  . OTHER SURGICAL HISTORY  1998  Bypass     Current Outpatient Prescriptions  Medication Sig Dispense Refill  . albuterol (VENTOLIN HFA) 108 (90 BASE) MCG/ACT inhaler Inhale 2 puffs into the lungs every 4 (four) hours as needed for wheezing.    [name] amiodarone (PACERONE) 200 MG tablet Take 200 mg by mouth daily.     [name] apixaban (ELIQUIS) 5 MG TABS tablet Take 5 mg by mouth 2 (two) times daily. Reported  on 07/31/2015    . aspirin EC 81 MG EC tablet Take 1 tablet (81 mg total) by mouth daily. 30 tablet 0  . atorvastatin (LIPITOR) 40 MG tablet Take 1 tablet (40 mg total) by mouth daily. 90 tablet 3  . busPIRone (BUSPAR) 7.5 MG tablet Take 7.5 mg by mouth 2 (two) times daily.    . carbidopa-levodopa (PARCOPA) 25-250 MG per disintegrating tablet Take 2 tablets by mouth 3 (three) times daily. Reported on 07/14/2015    . carvedilol (COREG) 6.25 MG tablet Take 1 tablet (6.25 mg total) by mouth 2 (two) times daily with a meal. 30 tablet 0  . clopidogrel (PLAVIX) 75 MG tablet Take 1 tablet (75 mg total) by mouth daily with breakfast. 30 tablet 0  . cyclobenzaprine (FLEXERIL) 5 MG tablet Take 5 mg by mouth daily.    [name] EPINEPHrine 0.3 mg/0.3 mL IJ SOAJ injection Inject 0.3 mg as directed once as needed (allergic reaction). Reported on 10/27/2015    . esomeprazole (NEXIUM) 20 MG capsule Take 40 mg by mouth daily at 12 noon.    . fluticasone (FLONASE) 50 MCG/ACT nasal spray TAKE 2 PUFFS IN EACH NOSTRIL EVERY DAY 16 g 3  . furosemide (LASIX) 40 MG tablet Take 1/2 tablet (20 mg) by mouth once every other day    . isosorbide mononitrate (IMDUR) 30 MG 24 hr tablet Take 1 tablet (30 mg total) by mouth daily. 30 tablet 0  . levothyroxine (SYNTHROID, LEVOTHROID) 50 MCG tablet 50 mcg daily before breakfast.     . losartan (COZAAR) 25 MG tablet Take 1 tablet (25 mg total) by mouth daily. 30 tablet 0  . mirabegron ER (MYRBETRIQ) 50 MG TB24 tablet Take 50 mg by mouth daily.     No current facility-administered medications for this visit.     Allergies:   Pravastatin and Prednisone    Social History:  The patient  reports that he quit smoking about 44 years ago. His smoking use included Cigarettes, Pipe, and Cigars. He has a 40.00 pack-year smoking history. He quit smokeless tobacco use about 44 years ago. His smokeless tobacco use included Chew. He reports that he drinks about 2.4 oz of alcohol per week . He reports  that he does not use drugs.   Family History:  The patient's Family history is unknown by patient.    ROS:  Please see the history of present illness.   Otherwise, review of systems are positive for none.   All other systems are reviewed and negative.    PHYSICAL EXAM: VS:  BP (!) 146/72 (BP Location: Left Arm, Patient Position: Sitting, Cuff Size: Large)   Pulse 65   Ht 5' 6.5" (1.689 m)   Wt 259 lb (117.5 kg)   BMI 41.18 kg/m  , BMI Body mass index is 41.18 kg/m. GEN: Well nourished, well developed, in no acute distress  HEENT: normal  Neck: no JVD, carotid bruits, or masses Cardiac: RRR; no murmurs, rubs, or gallops,no edema  Respiratory:  clear to auscultation bilaterally, normal work of breathing GI: soft,  nontender, nondistended, + BS MS: no deformity or atrophy  Skin: warm and dry, no rash Neuro:  Strength and sensation are intact Psych: euthymic mood, full affect Right radial pulse is normal with no hematoma  EKG:  EKG is ordered today. The ekg ordered today demonstrates sinus rhythm with occasional PVCs. Nonspecific ST and T wave changes.   Recent Labs: 01/29/2016: ALT 13 10/27/2016: TSH 3.31 11/01/2016: BUN 20; Creatinine, Ser 1.24; Hemoglobin 14.8; Platelets 108; Potassium 3.6; Sodium 139    Lipid Panel    Component Value Date/Time   CHOL 183 10/29/2016 0336   TRIG 229 (H) 10/29/2016 0336   HDL 45 10/29/2016 0336   CHOLHDL 4.1 10/29/2016 0336   VLDL 46 (H) 10/29/2016 0336   LDLCALC 92 10/29/2016 0336      Wt Readings from Last 3 Encounters:  11/11/16 259 lb (117.5 kg)  11/08/16 259 lb 6 oz (117.7 kg)  11/03/16 256 lb 4 oz (116.2 kg)       No flowsheet data found.    ASSESSMENT AND PLAN:  1.Recent non-ST elevation myocardial infarction: He is doing well after recent myocardial infarction and stent placement to the right coronary artery. Continue triple therapy with aspirin, Plavix and Eliquis for now. He can discontinue aspirin on August 15. I  referred him to cardiac rehabilitation. Continue aggressive treatment of risk factors. He has residual disease affecting the ramus artery. PCI should be left as a last resort given the moderate left main stenosis and bifurcation with the left circumflex.  2. Atrial fibrillation: Maintaining in sinus rhythm with amiodarone. Continue anticoagulation with Eliquis.  3. Essential hypertension: Blood pressure is mildly elevated. Continue to monitor and consider increasing losartan if needed.  3. Hyperlipidemia: Continue atorvastatin with a target LDL of less than 70.    Disposition:   FU with me in 3 months  Signed,  [name], MD  11/11/2016 9:05 AM    [place]

## 2016-11-11 NOTE — Patient Instructions (Addendum)
Medication Instructions:  Your physician has recommended you make the following change in your medication:  STOP taking aspirin on November 30, 2016   Labwork: none  Testing/Procedures: none  Follow-Up: Your physician recommends that you schedule a follow-up appointment in: 3 months with Dr. Fletcher Anon.    Any Other Special Instructions Will Be Listed Below (If Applicable). Cardiac Rehab [phone]      If you need a refill on your cardiac medications before your next appointment, please call your pharmacy.

## 2016-11-14 DIAGNOSIS — G4733 Obstructive sleep apnea (adult) (pediatric): Secondary | ICD-10-CM | POA: Diagnosis not present

## 2016-11-15 ENCOUNTER — Ambulatory Visit: Payer: PPO

## 2016-11-18 ENCOUNTER — Ambulatory Visit: Payer: PPO | Admitting: Internal Medicine

## 2016-11-21 ENCOUNTER — Encounter: Payer: PPO | Attending: Cardiovascular Disease | Admitting: *Deleted

## 2016-11-21 VITALS — Ht 66.8 in | Wt 258.8 lb

## 2016-11-21 DIAGNOSIS — Z7901 Long term (current) use of anticoagulants: Secondary | ICD-10-CM | POA: Insufficient documentation

## 2016-11-21 DIAGNOSIS — E785 Hyperlipidemia, unspecified: Secondary | ICD-10-CM | POA: Diagnosis not present

## 2016-11-21 DIAGNOSIS — G20 Parkinson's disease: Secondary | ICD-10-CM | POA: Insufficient documentation

## 2016-11-21 DIAGNOSIS — I214 Non-ST elevation (NSTEMI) myocardial infarction: Secondary | ICD-10-CM | POA: Insufficient documentation

## 2016-11-21 DIAGNOSIS — I5032 Chronic diastolic (congestive) heart failure: Secondary | ICD-10-CM | POA: Diagnosis not present

## 2016-11-21 DIAGNOSIS — I110 Hypertensive heart disease with heart failure: Secondary | ICD-10-CM | POA: Diagnosis not present

## 2016-11-21 DIAGNOSIS — D751 Secondary polycythemia: Secondary | ICD-10-CM | POA: Insufficient documentation

## 2016-11-21 DIAGNOSIS — Z7951 Long term (current) use of inhaled steroids: Secondary | ICD-10-CM | POA: Insufficient documentation

## 2016-11-21 DIAGNOSIS — Z87891 Personal history of nicotine dependence: Secondary | ICD-10-CM | POA: Diagnosis not present

## 2016-11-21 DIAGNOSIS — K219 Gastro-esophageal reflux disease without esophagitis: Secondary | ICD-10-CM | POA: Insufficient documentation

## 2016-11-21 DIAGNOSIS — Z79899 Other long term (current) drug therapy: Secondary | ICD-10-CM | POA: Diagnosis not present

## 2016-11-21 DIAGNOSIS — G4730 Sleep apnea, unspecified: Secondary | ICD-10-CM | POA: Insufficient documentation

## 2016-11-21 DIAGNOSIS — F329 Major depressive disorder, single episode, unspecified: Secondary | ICD-10-CM | POA: Insufficient documentation

## 2016-11-21 DIAGNOSIS — Z86711 Personal history of pulmonary embolism: Secondary | ICD-10-CM | POA: Insufficient documentation

## 2016-11-21 DIAGNOSIS — Z951 Presence of aortocoronary bypass graft: Secondary | ICD-10-CM | POA: Insufficient documentation

## 2016-11-21 DIAGNOSIS — I480 Paroxysmal atrial fibrillation: Secondary | ICD-10-CM | POA: Diagnosis not present

## 2016-11-21 DIAGNOSIS — Z7902 Long term (current) use of antithrombotics/antiplatelets: Secondary | ICD-10-CM | POA: Insufficient documentation

## 2016-11-21 DIAGNOSIS — I2510 Atherosclerotic heart disease of native coronary artery without angina pectoris: Secondary | ICD-10-CM | POA: Insufficient documentation

## 2016-11-21 DIAGNOSIS — Z955 Presence of coronary angioplasty implant and graft: Secondary | ICD-10-CM | POA: Insufficient documentation

## 2016-11-21 NOTE — Progress Notes (Signed)
Daily Session Note  Patient Details  Name: Andre [name] MRN: [id number] Date of Birth: [birth year] Referring Provider:    Encounter Date: 11/21/2016  Check In:     Session Check In - 11/21/16 1155      Check-In   Location [facility]   Staff Present Gerlene Burdock, RN, Levie Heritage, MA, ACSM RCEP, Exercise Physiologist   Supervising physician immediately available to respond to emergencies See telemetry face sheet for immediately available ER MD   Medication changes reported     No   Fall or balance concerns reported    No   Tobacco Cessation No Change   Warm-up and Cool-down Performed as group-led instruction   Resistance Training Performed Yes   VAD Patient? No     Pain Assessment   Currently in Pain? No/denies         History  Smoking Status  . Former Smoker  . Packs/day: 1.00  . Years: 40.00  . Types: Cigarettes, Pipe, Cigars  . Quit date: 04/18/1972  Smokeless Tobacco  . Former Systems developer  . Types: Chew  . Quit date: 04/18/1972    Goals Met:  Proper associated with RPD/PD & O2 Sat Exercise tolerated well Personal goals reviewed No report of cardiac concerns or symptoms Strength training completed today  Goals Unmet:  Not Applicable  Comments:     Dr. Emily Filbert is Medical Director for [facility] and [facility].

## 2016-11-21 NOTE — Patient Instructions (Signed)
Patient Instructions  Patient Details  Name: Andre [name] MRN: [id number] Date of Birth: [birth year] Referring Provider:  Wellington Hampshire, MD  Below are the personal goals you chose as well as exercise and nutrition goals. Our goal is to help you keep on track towards obtaining and maintaining your goals. We will be discussing your progress on these goals with you throughout the program.  Initial Exercise Prescription:     Initial Exercise Prescription - 11/21/16 1200      Date of Initial Exercise RX and Referring Provider   Date 11/21/16   Referring Provider [name] MD     Treadmill   MPH 1.3   Grade 0   Minutes 15   METs 2     Recumbant Bike   Level 1   RPM 50   Watts 5   Minutes 15   METs 2.14     T5 Nustep   Level 1   SPM 80   Minutes 15   METs 2     Prescription Details   Frequency (times per week) 3   Duration Progress to 45 minutes of aerobic exercise without signs/symptoms of physical distress     Intensity   THRR 40-80% of Max Heartrate 91-124   Ratings of Perceived Exertion 11-13   Perceived Dyspnea 0-4     Progression   Progression Continue to progress workloads to maintain intensity without signs/symptoms of physical distress.     Resistance Training   Training Prescription Yes   Weight 3 lbs   Reps 10-15      Exercise Goals: Frequency: Be able to perform aerobic exercise three times per week working toward 3-5 days per week.  Intensity: Work with a perceived exertion of 11 (fairly light) - 15 (hard) as tolerated. Follow your new exercise prescription and watch for changes in prescription as you progress with the program. Changes will be reviewed with you when they are made.  Duration: You should be able to do 30 minutes of continuous aerobic exercise in addition to a 5 minute warm-up and a 5 minute cool-down routine.  Nutrition Goals: Your personal nutrition goals will be established when you do your nutrition analysis with the  dietician.  The following are nutrition guidelines to follow: Cholesterol < 200mg /day Sodium < 1500mg /day Fiber: Men over 50 yrs - 30 grams per day  Personal Goals:     Personal Goals and Risk Factors at Admission - 11/21/16 1146      Core Components/Risk Factors/Patient Goals on Admission    Weight Management Yes;Obesity;Weight Loss   Intervention Weight Management: Develop a combined nutrition and exercise program designed to reach desired caloric intake, while maintaining appropriate intake of nutrient and fiber, sodium and fats, and appropriate energy expenditure required for the weight goal.;Weight Management: Provide education and appropriate resources to help participant work on and attain dietary goals.;Weight Management/Obesity: Establish reasonable short term and long term weight goals.  Dravyn reports that he has lost about 15 lbs in the past 2 months and hopes to lose more. Ahnaf reported that he started running in his 45s and ran in [place] in 3 hours and 19minutes.   Admit Weight 258 lb 12.8 oz (117.4 kg)   Goal Weight: Short Term 250 lb (113.4 kg)   Goal Weight: Long Term 200 lb (90.7 kg)   Expected Outcomes Short Term: Continue to assess and modify interventions until short term weight is achieved;Long Term: Adherence to nutrition and  physical activity/exercise program aimed toward attainment of established weight goal;Weight Loss: Understanding of general recommendations for a balanced deficit meal plan, which promotes 1-2 lb weight loss per week and includes a negative energy balance of [phone] kcal/d;Understanding of distribution of calorie intake throughout the day with the consumption of 4-5 meals/snacks;Understanding recommendations for meals to include 15-35% energy as protein, 25-35% energy from fat, 35-60% energy from carbohydrates, less than 200mg  of dietary cholesterol, 20-35 gm of total fiber daily   Heart Failure Yes    Intervention Provide a combined exercise and nutrition program that is supplemented with education, support and counseling about heart failure. Directed toward relieving symptoms such as shortness of breath, decreased exercise tolerance, and extremity edema.   Expected Outcomes Improve functional capacity of life;Short term: Attendance in program 2-3 days a week with increased exercise capacity. Reported lower sodium intake. Reported increased fruit and vegetable intake. Reports medication compliance.;Short term: Daily weights obtained and reported for increase. Utilizing diuretic protocols set by physician.;Long term: Adoption of self-care skills and reduction of barriers for early signs and symptoms recognition and intervention leading to self-care maintenance.   Hypertension Yes   Intervention Provide education on lifestyle modifcations including regular physical activity/exercise, weight management, moderate sodium restriction and increased consumption of fresh fruit, vegetables, and low fat dairy, alcohol moderation, and smoking cessation.;Monitor prescription use compliance.   Expected Outcomes Long Term: Maintenance of blood pressure at goal levels.;Short Term: Continued assessment and intervention until BP is < 140/51mm HG in hypertensive participants. < 130/4mm HG in hypertensive participants with diabetes, heart failure or chronic kidney disease.   Lipids Yes   Intervention Provide education and support for participant on nutrition & aerobic/resistive exercise along with prescribed medications to achieve LDL 70mg , HDL >40mg .   Expected Outcomes Short Term: Participant states understanding of desired cholesterol values and is compliant with medications prescribed. Participant is following exercise prescription and nutrition guidelines.;Long Term: Cholesterol controlled with medications as prescribed, with individualized exercise RX and with personalized nutrition plan. Value goals: LDL < 70mg , HDL  > 40 mg.      Tobacco Use Initial Evaluation: History  Smoking Status  . Former Smoker  . Packs/day: 1.00  . Years: 40.00  . Types: Cigarettes, Pipe, Cigars  . Quit date: 04/18/1972  Smokeless Tobacco  . Former Systems developer  . Types: Chew  . Quit date: 04/18/1972    Copy of goals given to participant.

## 2016-11-21 NOTE — Progress Notes (Signed)
Cardiac Individual Treatment Plan  Patient Details  Name: Andre [name] MRN: [id number] Date of Birth: [birth year] Referring Provider:     Cardiac Rehab from 11/21/2016 in [facility] Cardiac and Pulmonary Rehab  Referring Provider  [name] MD      Initial Encounter Date:    Cardiac Rehab from 11/21/2016 in [facility] Cardiac and Pulmonary Rehab  Date  11/21/16  Referring Provider  [name] MD      Visit Diagnosis: NSTEMI (non-ST elevated myocardial infarction) [facility])  Status post coronary artery stent placement  Patient's Home Medications on Admission:  Current Outpatient Prescriptions:  .  albuterol (VENTOLIN HFA) 108 (90 BASE) MCG/ACT inhaler, Inhale 2 puffs into the lungs every 4 (four) hours as needed for wheezing., Disp: , Rfl:  .  amiodarone (PACERONE) 200 MG tablet, Take 200 mg by mouth daily. , Disp: , Rfl:  .  apixaban (ELIQUIS) 5 MG TABS tablet, Take 5 mg by mouth 2 (two) times daily. Reported on 07/31/2015, Disp: , Rfl:  .  atorvastatin (LIPITOR) 40 MG tablet, Take 1 tablet (40 mg total) by mouth daily., Disp: 90 tablet, Rfl: 3 .  busPIRone (BUSPAR) 7.5 MG tablet, Take 7.5 mg by mouth 2 (two) times daily., Disp: , Rfl:  .  carbidopa-levodopa (PARCOPA) 25-250 MG per disintegrating tablet, Take 2 tablets by mouth 3 (three) times daily. Reported on 07/14/2015, Disp: , Rfl:  .  carvedilol (COREG) 6.25 MG tablet, Take 1 tablet (6.25 mg total) by mouth 2 (two) times daily with a meal., Disp: 30 tablet, Rfl: 0 .  clopidogrel (PLAVIX) 75 MG tablet, Take 1 tablet (75 mg total) by mouth daily with breakfast., Disp: 30 tablet, Rfl: 0 .  cyclobenzaprine (FLEXERIL) 5 MG tablet, Take 5 mg by mouth daily., Disp: , Rfl:  .  EPINEPHrine 0.3 mg/0.3 mL IJ SOAJ injection, Inject 0.3 mg as directed once as needed (allergic reaction). Reported on 10/27/2015, Disp: , Rfl:  .  esomeprazole (NEXIUM) 20 MG capsule, Take 40 mg by mouth daily at 12 noon., Disp: , Rfl:  .  fluticasone (FLONASE) 50 MCG/ACT  nasal spray, TAKE 2 PUFFS IN EACH NOSTRIL EVERY DAY, Disp: 16 g, Rfl: 3 .  furosemide (LASIX) 40 MG tablet, Take 1/2 tablet (20 mg) by mouth once every other day, Disp: , Rfl:  .  isosorbide mononitrate (IMDUR) 30 MG 24 hr tablet, Take 1 tablet (30 mg total) by mouth daily., Disp: 30 tablet, Rfl: 0 .  levothyroxine (SYNTHROID, LEVOTHROID) 50 MCG tablet, 50 mcg daily before breakfast. , Disp: , Rfl:  .  losartan (COZAAR) 25 MG tablet, Take 1 tablet (25 mg total) by mouth daily., Disp: 30 tablet, Rfl: 0 .  mirabegron ER (MYRBETRIQ) 50 MG TB24 tablet, Take 50 mg by mouth daily., Disp: , Rfl:   Past Medical History: Past Medical History:  Diagnosis Date  . Cervical spondylosis 10/01/2013  . Chronic diastolic CHF (congestive heart failure) ([place])    a. 07/2016 Echo: >55%.  . Coronary artery disease    a. 1998 s/p mini-cabg @ [facility] - pt reports one vessel bypass to LAD;  b. 07/2016 St Echo:  Inadequate HR (max 97) w/ hypertensive response (220/96). Ex time only 2:54 - stopped due to dyspnea and leg pain;  c.  08/2016 MV: EF 67%, no ischemia, low risk.  . Depression   . GERD (gastroesophageal reflux disease)   . Hyperlipidemia   . Hypertension   . PAF (paroxysmal atrial fibrillation) ([place])    a. s/p  DCCV-->maintaining sinus on amiodarone;  b. CHA2DS2VASc = 5-->eliquis.  . Parkinson's disease ([place])    tremors  . Pulmonary embolism ([place]) 2011  . 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 Sleep apnea    wears CPAP    Tobacco Use: History  Smoking Status  . Former Smoker  . Packs/day: 1.00  . Years: 40.00  . Types: Cigarettes, Pipe, Cigars  . Quit date: 04/18/1972  Smokeless Tobacco  . Former Systems developer  . Types: Chew  . Quit date: 04/18/1972    Labs: Recent Review Flowsheet Data    Labs for ITP Cardiac and Pulmonary Rehab Latest Ref Rng & Units 10/27/2016 10/29/2016   Cholestrol 0 - 200 mg/dL 173 183   LDLCALC 0 - 99 mg/dL 95 92   HDL >40 mg/dL 50.90 45   Trlycerides <150 mg/dL 137.0 229(H)        Exercise Target Goals: Date: 11/21/16  Exercise Program Goal: Individual exercise prescription set with THRR, safety & activity barriers. Participant demonstrates ability to understand and report RPE using BORG scale, to self-measure pulse accurately, and to acknowledge the importance of the exercise prescription.  Exercise Prescription Goal: Starting with aerobic activity 30 plus minutes a day, 3 days per week for initial exercise prescription. Provide home exercise prescription and guidelines that participant acknowledges understanding prior to discharge.  Activity Barriers & Risk Stratification:     Activity Barriers & Cardiac Risk Stratification - 11/21/16 1158      Activity Barriers & Cardiac Risk Stratification   Activity Barriers Neck/Spine Problems;Shortness of Breath;Muscular Weakness;Deconditioning   Cardiac Risk Stratification High      6 Minute Walk:     6 Minute Walk    Row Name 11/21/16 1220         6 Minute Walk   Phase Initial     Distance 58 feet     Walk Time 6 minutes     # of Rest Breaks 0     MPH 2.58     METS 2.02     RPE 16     Perceived Dyspnea  2     VO2 Peak 7.05     Symptoms Yes (comment)     Comments SOB, leg fatigue, legs feel wobbly, dizzy on turns      Resting HR 58 bpm     Resting BP 124/64     Max Ex. HR 109 bpm     Max Ex. BP 154/64     2 Minute Post BP 146/74  recheck 128/70        Oxygen Initial Assessment:     Oxygen Initial Assessment - 11/21/16 1157      Initial 6 min Walk   Oxygen Used None      Oxygen Re-Evaluation:   Oxygen Discharge (Final Oxygen Re-Evaluation):   Initial Exercise Prescription:     Initial Exercise Prescription - 11/21/16 1200      Date of Initial Exercise RX and Referring Provider   Date 11/21/16   Referring Provider [name] MD     Treadmill   MPH 1.3   Grade 0   Minutes 15   METs 2     Recumbant Bike   Level 1   RPM 50   Watts 5   Minutes 15   METs 2.14      T5 Nustep   Level 1   SPM 80   Minutes 15   METs 2     Prescription Details   Frequency (times per  week) 3   Duration Progress to 45 minutes of aerobic exercise without signs/symptoms of physical distress     Intensity   THRR 40-80% of Max Heartrate 91-124   Ratings of Perceived Exertion 11-13   Perceived Dyspnea 0-4     Progression   Progression Continue to progress workloads to maintain intensity without signs/symptoms of physical distress.     Resistance Training   Training Prescription Yes   Weight 3 lbs   Reps 10-15      Perform Capillary Blood Glucose checks as needed.  Exercise Prescription Changes:      Exercise Prescription Changes    Row Name 11/21/16 1200             Response to Exercise   Blood Pressure (Admit) 124/64       Blood Pressure (Exercise) 154/64       Blood Pressure (Exit) 146/74  recheck 128/70       Heart Rate (Admit) 58 bpm       Heart Rate (Exercise) 109 bpm       Heart Rate (Exit) 76 bpm       Oxygen Saturation (Admit) 94 %       Oxygen Saturation (Exercise) 92 %       Rating of Perceived Exertion (Exercise) 16       Perceived Dyspnea (Exercise) 2       Symptoms SOB, leg fatigue, dizzy on turns       Comments walk test results          Exercise Comments:   Exercise Goals and Review:      Exercise Goals    Row Name 11/21/16 1229             Exercise Goals   Increase Physical Activity Yes       Intervention Provide advice, education, support and counseling about physical activity/exercise needs.;Develop an individualized exercise prescription for aerobic and resistive training based on initial evaluation findings, risk stratification, comorbidities and participant's personal goals.       Expected Outcomes Achievement of increased cardiorespiratory fitness and enhanced flexibility, muscular endurance and strength shown through measurements of functional capacity and personal statement of participant.       Increase  Strength and Stamina Yes       Intervention Provide advice, education, support and counseling about physical activity/exercise needs.;Develop an individualized exercise prescription for aerobic and resistive training based on initial evaluation findings, risk stratification, comorbidities and participant's personal goals.       Expected Outcomes Achievement of increased cardiorespiratory fitness and enhanced flexibility, muscular endurance and strength shown through measurements of functional capacity and personal statement of participant.          Exercise Goals Re-Evaluation :   Discharge Exercise Prescription (Final Exercise Prescription Changes):     Exercise Prescription Changes - 11/21/16 1200      Response to Exercise   Blood Pressure (Admit) 124/64   Blood Pressure (Exercise) 154/64   Blood Pressure (Exit) 146/74  recheck 128/70   Heart Rate (Admit) 58 bpm   Heart Rate (Exercise) 109 bpm   Heart Rate (Exit) 76 bpm   Oxygen Saturation (Admit) 94 %   Oxygen Saturation (Exercise) 92 %   Rating of Perceived Exertion (Exercise) 16   Perceived Dyspnea (Exercise) 2   Symptoms SOB, leg fatigue, dizzy on turns   Comments walk test results      Nutrition:  Target Goals: Understanding of nutrition guidelines, daily intake of sodium <  1500mg , cholesterol 200mg , calories 30% from fat and 7% or less from saturated fats, daily to have 5 or more servings of fruits and vegetables.  Biometrics:     Pre Biometrics - 11/21/16 1229      Pre Biometrics   Height 5' 6.8" (1.697 m)   Weight 258 lb 12.8 oz (117.4 kg)   Waist Circumference 46 inches   Hip Circumference 46 inches   Waist to Hip Ratio 1 %   BMI (Calculated) 40.9   Single Leg Stand 7.53 seconds       Nutrition Therapy Plan and Nutrition Goals:     Nutrition Therapy & Goals - 11/21/16 1158      Nutrition Therapy   Drug/Food Interactions Statins/Certain Fruits     Intervention Plan   Intervention Prescribe,  educate and counsel regarding individualized specific dietary modifications aiming towards targeted core components such as weight, hypertension, lipid management, diabetes, heart failure and other comorbidities.   Expected Outcomes Short Term Goal: Understand basic principles of dietary content, such as calories, fat, sodium, cholesterol and nutrients.      Nutrition Discharge: Rate Your Plate Scores:     Nutrition Assessments - 11/21/16 1158      MEDFICTS Scores   Pre Score 26      Nutrition Goals Re-Evaluation:   Nutrition Goals Discharge (Final Nutrition Goals Re-Evaluation):   Psychosocial: Target Goals: Acknowledge presence or absence of significant depression and/or stress, maximize coping skills, provide positive support system. Participant is able to verbalize types and ability to use techniques and skills needed for reducing stress and depression.   Initial Review & Psychosocial Screening:     Initial Psych Review & Screening - 11/21/16 1148      Family Dynamics   Comments Arion reports that he has lost about 15 lbs in the past 2 months and hopes to lose more. [place] reported that he started running in his 81s and ran in [place] in 3 hours and 75minutes. Aldo said he got frustrated since he had heart problems after he felt like he did all that exericisng including riding his bicycle 100 miles in a day.       Quality of Life Scores:      Quality of Life - 11/21/16 1145      Quality of Life Scores   Health/Function Pre 26.8 %   Socioeconomic Pre 28.29 %   Psych/Spiritual Pre 18.86 %   Family Pre 30 %   GLOBAL Pre 25.94 %      PHQ-9: Recent Review Flowsheet Data    Depression screen [facility] 2/9 11/21/2016 09/27/2016   Decreased Interest 3 3   Down, Depressed, Hopeless 2 3   PHQ - 2 Score 5 6   Altered sleeping 0 3   Tired, decreased energy 2 3   Change in appetite 1 3   Feeling bad or failure about yourself  3 2    Trouble concentrating 0 1   Moving slowly or fidgety/restless 0 0   Suicidal thoughts 0 1   PHQ-9 Score 11 19   Difficult doing work/chores Not difficult at all -     Interpretation of Total Score  Total Score Depression Severity:  1-4 = Minimal depression, 5-9 = Mild depression, 10-14 = Moderate depression, 15-19 = Moderately severe depression, 20-27 = Severe depression   Psychosocial Evaluation and Intervention:   Psychosocial Re-Evaluation:   Psychosocial Discharge (Final Psychosocial Re-Evaluation):   Vocational Rehabilitation: Provide vocational rehab  assistance to qualifying candidates.   Vocational Rehab Evaluation & Intervention:     Vocational Rehab - 11/21/16 1154      Initial Vocational Rehab Evaluation & Intervention   Assessment shows need for Vocational Rehabilitation No      Education: Education Goals: Education classes will be provided on a weekly basis, covering required topics. Participant will state understanding/return demonstration of topics presented.  Learning Barriers/Preferences:     Learning Barriers/Preferences - 11/21/16 1154      Learning Barriers/Preferences   Learning Barriers Hearing   Learning Preferences Individual Instruction      Education Topics: General Nutrition Guidelines/Fats and Fiber: -Group instruction provided by verbal, written material, models and posters to present the general guidelines for heart healthy nutrition. Gives an explanation and review of dietary fats and fiber.   Controlling Sodium/Reading Food Labels: -Group verbal and written material supporting the discussion of sodium use in heart healthy nutrition. Review and explanation with models, verbal and written materials for utilization of the food label.   Exercise Physiology & Risk Factors: - Group verbal and written instruction with models to review the exercise physiology of the cardiovascular system and associated critical values. Details  cardiovascular disease risk factors and the goals associated with each risk factor.   Aerobic Exercise & Resistance Training: - Gives group verbal and written discussion on the health impact of inactivity. On the components of aerobic and resistive training programs and the benefits of this training and how to safely progress through these programs.   Flexibility, Balance, General Exercise Guidelines: - Provides group verbal and written instruction on the benefits of flexibility and balance training programs. Provides general exercise guidelines with specific guidelines to those with heart or lung disease. Demonstration and skill practice provided.   Stress Management: - Provides group verbal and written instruction about the health risks of elevated stress, cause of high stress, and healthy ways to reduce stress.   Depression: - Provides group verbal and written instruction on the correlation between heart/lung disease and depressed mood, treatment options, and the stigmas associated with seeking treatment.   Anatomy & Physiology of the Heart: - Group verbal and written instruction and models provide basic cardiac anatomy and physiology, with the coronary electrical and arterial systems. Review of: AMI, Angina, Valve disease, Heart Failure, Cardiac Arrhythmia, Pacemakers, and the ICD.   Cardiac Procedures: - Group verbal and written instruction and models to describe the testing methods done to diagnose heart disease. Reviews the outcomes of the test results. Describes the treatment choices: Medical Management, Angioplasty, or Coronary Bypass Surgery.   Cardiac Medications: - Group verbal and written instruction to review commonly prescribed medications for heart disease. Reviews the medication, class of the drug, and side effects. Includes the steps to properly store meds and maintain the prescription regimen.   Go Sex-Intimacy & Heart Disease, Get SMART - Goal Setting: - Group  verbal and written instruction through game format to discuss heart disease and the return to sexual intimacy. Provides group verbal and written material to discuss and apply goal setting through the application of the S.M.A.R.T. Method.   Other Matters of the Heart: - Provides group verbal, written materials and models to describe Heart Failure, Angina, Valve Disease, and Diabetes in the realm of heart disease. Includes description of the disease process and treatment options available to the cardiac patient.   Exercise & Equipment Safety: - Individual verbal instruction and demonstration of equipment use and safety with use of the equipment.  Cardiac Rehab from 11/21/2016 in [facility] Cardiac and Pulmonary Rehab  Date  11/21/16  Educator  C.EnterkinRN  Instruction Review Code  1- partially meets, needs review/practice      Infection Prevention: - Provides verbal and written material to individual with discussion of infection control including proper hand washing and proper equipment cleaning during exercise session.   Cardiac Rehab from 11/21/2016 in [facility] Cardiac and Pulmonary Rehab  Date  11/21/16  Educator  C. Puryear  Instruction Review Code  1- partially meets, needs review/practice      Falls Prevention: - Provides verbal and written material to individual with discussion of falls prevention and safety.   Cardiac Rehab from 11/21/2016 in [facility] Cardiac and Pulmonary Rehab  Date  11/21/16  Educator  C. Ossian  Instruction Review Code  1- partially meets, needs review/practice      Diabetes: - Individual verbal and written instruction to review signs/symptoms of diabetes, desired ranges of glucose level fasting, after meals and with exercise. Advice that pre and post exercise glucose checks will be done for 3 sessions at entry of program.    Knowledge Questionnaire Score:     Knowledge Questionnaire Score - 11/21/16 1154      Knowledge Questionnaire Score   Pre Score  26/28      Core Components/Risk Factors/Patient Goals at Admission:     Personal Goals and Risk Factors at Admission - 11/21/16 1146      Core Components/Risk Factors/Patient Goals on Admission    Weight Management Yes;Obesity;Weight Loss   Intervention Weight Management: Develop a combined nutrition and exercise program designed to reach desired caloric intake, while maintaining appropriate intake of nutrient and fiber, sodium and fats, and appropriate energy expenditure required for the weight goal.;Weight Management: Provide education and appropriate resources to help participant work on and attain dietary goals.;Weight Management/Obesity: Establish reasonable short term and long term weight goals.  Arvine reports that he has lost about 15 lbs in the past 2 months and hopes to lose more. [place] reported that he started running in his 24s and ran in [place] in 3 hours and 50minutes.   Admit Weight 258 lb 12.8 oz (117.4 kg)   Goal Weight: Short Term 250 lb (113.4 kg)   Goal Weight: Long Term 200 lb (90.7 kg)   Expected Outcomes Short Term: Continue to assess and modify interventions until short term weight is achieved;Long Term: Adherence to nutrition and physical activity/exercise program aimed toward attainment of established weight goal;Weight Loss: Understanding of general recommendations for a balanced deficit meal plan, which promotes 1-2 lb weight loss per week and includes a negative energy balance of [phone] kcal/d;Understanding of distribution of calorie intake throughout the day with the consumption of 4-5 meals/snacks;Understanding recommendations for meals to include 15-35% energy as protein, 25-35% energy from fat, 35-60% energy from carbohydrates, less than 200mg  of dietary cholesterol, 20-35 gm of total fiber daily   Heart Failure Yes   Intervention Provide a combined exercise and nutrition program that is supplemented with education, support  and counseling about heart failure. Directed toward relieving symptoms such as shortness of breath, decreased exercise tolerance, and extremity edema.   Expected Outcomes Improve functional capacity of life;Short term: Attendance in program 2-3 days a week with increased exercise capacity. Reported lower sodium intake. Reported increased fruit and vegetable intake. Reports medication compliance.;Short term: Daily weights obtained and reported for increase. Utilizing diuretic protocols set by physician.;Long term: Adoption of self-care skills and reduction  of barriers for early signs and symptoms recognition and intervention leading to self-care maintenance.   Hypertension Yes   Intervention Provide education on lifestyle modifcations including regular physical activity/exercise, weight management, moderate sodium restriction and increased consumption of fresh fruit, vegetables, and low fat dairy, alcohol moderation, and smoking cessation.;Monitor prescription use compliance.   Expected Outcomes Long Term: Maintenance of blood pressure at goal levels.;Short Term: Continued assessment and intervention until BP is < 140/84mm HG in hypertensive participants. < 130/37mm HG in hypertensive participants with diabetes, heart failure or chronic kidney disease.   Lipids Yes   Intervention Provide education and support for participant on nutrition & aerobic/resistive exercise along with prescribed medications to achieve LDL 70mg , HDL >40mg .   Expected Outcomes Short Term: Participant states understanding of desired cholesterol values and is compliant with medications prescribed. Participant is following exercise prescription and nutrition guidelines.;Long Term: Cholesterol controlled with medications as prescribed, with individualized exercise RX and with personalized nutrition plan. Value goals: LDL < 70mg , HDL > 40 mg.      Core Components/Risk Factors/Patient Goals Review:    Core Components/Risk  Factors/Patient Goals at Discharge (Final Review):    ITP Comments:     ITP Comments    Row Name 11/21/16 1234           ITP Comments Medical Review completed.  Diagnosis documentation can be found in [facility] encounter 11/11/16          Comments: Initial ITP

## 2016-11-23 ENCOUNTER — Encounter: Payer: Self-pay | Admitting: Family Medicine

## 2016-11-23 ENCOUNTER — Encounter: Payer: PPO | Admitting: *Deleted

## 2016-11-23 DIAGNOSIS — I214 Non-ST elevation (NSTEMI) myocardial infarction: Secondary | ICD-10-CM | POA: Diagnosis not present

## 2016-11-23 DIAGNOSIS — Z955 Presence of coronary angioplasty implant and graft: Secondary | ICD-10-CM

## 2016-11-23 NOTE — Progress Notes (Signed)
Daily Session Note  Patient Details  Name: Andre [name] MRN: [id number] Date of Birth: November 16, 1935 Referring Provider:     Cardiac Rehab from 11/21/2016 in [facility] Cardiac and Pulmonary Rehab  Referring Provider  [name] MD      Encounter Date: 11/23/2016  Check In:     Session Check In - 11/23/16 1809      Check-In   Location [facility]-Cardiac & Pulmonary Rehab   Staff Present Nyoka Cowden, RN, BSN, MA;Susanne Bice, RN, BSN, CCRP;Meredith Sherryll Burger, RN BSN   Supervising physician immediately available to respond to emergencies See telemetry face sheet for immediately available ER MD   Medication changes reported     No   Fall or balance concerns reported    No   Warm-up and Cool-down Performed on first and last piece of equipment   Resistance Training Performed Yes   VAD Patient? No     Pain Assessment   Currently in Pain? No/denies         History  Smoking Status  . Former Smoker  . Packs/day: 1.00  . Years: 40.00  . Types: Cigarettes, Pipe, Cigars  . Quit date: 04/18/1972  Smokeless Tobacco  . Former Systems developer  . Types: Chew  . Quit date: 04/18/1972    Goals Met:  Proper associated with RPD/PD & O2 Sat Independence with exercise equipment Exercise tolerated well No report of cardiac concerns or symptoms Strength training completed today  Goals Unmet:  Not Applicable  Comments: First full day of exercise!  Patient was oriented to gym and equipment including functions, settings, policies, and procedures.  Patient's individual exercise prescription and treatment plan were reviewed.  All starting workloads were established based on the results of the 6 minute walk test done at initial orientation visit.  The plan for exercise progression was also introduced and progression will be customized based on patient's performance and goals.    Dr. Emily Filbert is Medical Director for [facility] and [facility].

## 2016-11-24 NOTE — Progress Notes (Signed)
This encounter was created in error - please disregard.

## 2016-11-25 ENCOUNTER — Other Ambulatory Visit: Payer: Self-pay | Admitting: Cardiovascular Disease

## 2016-11-25 NOTE — Telephone Encounter (Signed)
Can you please review for refill.  I do not see that Dr. Fletcher Anon prescribed this medication.

## 2016-11-25 NOTE — Telephone Encounter (Signed)
Can you please review for refills?  I'm not seeing where the medication was filled by Dr. Fletcher Anon.

## 2016-11-28 ENCOUNTER — Telehealth: Payer: Self-pay | Admitting: Cardiovascular Disease

## 2016-11-28 DIAGNOSIS — I214 Non-ST elevation (NSTEMI) myocardial infarction: Secondary | ICD-10-CM | POA: Diagnosis not present

## 2016-11-28 DIAGNOSIS — Z955 Presence of coronary angioplasty implant and graft: Secondary | ICD-10-CM

## 2016-11-28 NOTE — Telephone Encounter (Signed)
Pt wife calling asking how is patient to be on fluid pill He is doing fine and is not sure if it is because of the medication but he is having to use the restroom a lot more  Please advise

## 2016-11-28 NOTE — Progress Notes (Signed)
Daily Session Note  Patient Details  Name: Andre [name] MRN: [id number] Date of Birth: 28-Nov-1935 Referring Provider:     Cardiac Rehab from 11/21/2016 in [facility] Cardiac and Pulmonary Rehab  Referring Provider  [name] MD      Encounter Date: 11/28/2016  Check In:     Session Check In - 11/28/16 1731      Check-In   Location [facility]-Cardiac & Pulmonary Rehab   Staff Present Nada Maclachlan, BA, ACSM CEP, Exercise Physiologist;Kelly Amedeo Plenty, BS, ACSM CEP, Exercise Physiologist;Meredith Sherryll Burger, RN BSN   Supervising physician immediately available to respond to emergencies See telemetry face sheet for immediately available ER MD   Medication changes reported     No   Fall or balance concerns reported    No   Warm-up and Cool-down Performed on first and last piece of equipment   Resistance Training Performed Yes   VAD Patient? No     Pain Assessment   Currently in Pain? No/denies         History  Smoking Status  . Former Smoker  . Packs/day: 1.00  . Years: 40.00  . Types: Cigarettes, Pipe, Cigars  . Quit date: 04/18/1972  Smokeless Tobacco  . Former Systems developer  . Types: Chew  . Quit date: 04/18/1972    Goals Met:  Independence with exercise equipment Exercise tolerated well No report of cardiac concerns or symptoms Strength training completed today  Goals Unmet:  Not Applicable  Comments: Pt able to follow exercise prescription today without complaint.  Will continue to monitor for progression.    Dr. Emily Filbert is Medical Director for [place] and [facility].

## 2016-11-28 NOTE — Telephone Encounter (Signed)
S/w pt who reports he wants to know if he needs to continue lasix because he is not experiencing any edema. Reviewed reasons for taking lasix. He is agreeable to continue medications. He also reports he does not want to go back to Cardiac Rehab because he didn't know what to do at this second appointment. Said he felt like there was no one there to help him. I have asked him to let me call Cardiac Rehab and explain he needs more assistance. After much discussion, he is agreeable to go today and see if he feels more comfortable with the equipment/process. He will call me back tomorrow to let me know how things went. I s/w Arbie Cookey, RN,  at Cardiac Rehab to explain the situation. Arbie Cookey is appreciative of the information and will call him now.

## 2016-11-29 ENCOUNTER — Inpatient Hospital Stay: Payer: PPO | Attending: Internal Medicine

## 2016-11-29 ENCOUNTER — Inpatient Hospital Stay: Payer: PPO

## 2016-11-29 DIAGNOSIS — D696 Thrombocytopenia, unspecified: Secondary | ICD-10-CM | POA: Insufficient documentation

## 2016-11-29 DIAGNOSIS — D751 Secondary polycythemia: Secondary | ICD-10-CM | POA: Diagnosis not present

## 2016-11-29 DIAGNOSIS — Z87891 Personal history of nicotine dependence: Secondary | ICD-10-CM | POA: Insufficient documentation

## 2016-11-29 DIAGNOSIS — Z86711 Personal history of pulmonary embolism: Secondary | ICD-10-CM | POA: Diagnosis not present

## 2016-11-29 DIAGNOSIS — K219 Gastro-esophageal reflux disease without esophagitis: Secondary | ICD-10-CM | POA: Insufficient documentation

## 2016-11-29 DIAGNOSIS — G20 Parkinson's disease: Secondary | ICD-10-CM | POA: Diagnosis not present

## 2016-11-29 DIAGNOSIS — Z951 Presence of aortocoronary bypass graft: Secondary | ICD-10-CM | POA: Diagnosis not present

## 2016-11-29 DIAGNOSIS — Z79899 Other long term (current) drug therapy: Secondary | ICD-10-CM | POA: Diagnosis not present

## 2016-11-29 DIAGNOSIS — I4891 Unspecified atrial fibrillation: Secondary | ICD-10-CM | POA: Diagnosis not present

## 2016-11-29 DIAGNOSIS — Z7901 Long term (current) use of anticoagulants: Secondary | ICD-10-CM | POA: Insufficient documentation

## 2016-11-29 DIAGNOSIS — G4730 Sleep apnea, unspecified: Secondary | ICD-10-CM | POA: Insufficient documentation

## 2016-11-29 DIAGNOSIS — I10 Essential (primary) hypertension: Secondary | ICD-10-CM | POA: Diagnosis not present

## 2016-11-29 DIAGNOSIS — M1990 Unspecified osteoarthritis, unspecified site: Secondary | ICD-10-CM | POA: Diagnosis not present

## 2016-11-29 DIAGNOSIS — F329 Major depressive disorder, single episode, unspecified: Secondary | ICD-10-CM | POA: Diagnosis not present

## 2016-11-29 DIAGNOSIS — E7800 Pure hypercholesterolemia, unspecified: Secondary | ICD-10-CM | POA: Diagnosis not present

## 2016-11-29 DIAGNOSIS — I2510 Atherosclerotic heart disease of native coronary artery without angina pectoris: Secondary | ICD-10-CM | POA: Insufficient documentation

## 2016-11-29 LAB — CBC WITH DIFFERENTIAL/PLATELET
Basophils Absolute: 0.1 10*3/uL (ref 0–0.1)
Basophils Relative: 1 %
Eosinophils Absolute: 0.1 10*3/uL (ref 0–0.7)
Eosinophils Relative: 1 %
HCT: 44.6 % (ref 40.0–52.0)
Hemoglobin: 14.9 g/dL (ref 13.0–18.0)
Lymphocytes Relative: 19 %
Lymphs Abs: 1.8 10*3/uL (ref 1.0–3.6)
MCH: 30.5 pg (ref 26.0–34.0)
MCHC: 33.4 g/dL (ref 32.0–36.0)
MCV: 91.2 fL (ref 80.0–100.0)
Monocytes Absolute: 0.9 10*3/uL (ref 0.2–1.0)
Monocytes Relative: 9 %
Neutro Abs: 6.5 10*3/uL (ref 1.4–6.5)
Neutrophils Relative %: 70 %
Platelets: 120 10*3/uL — ABNORMAL LOW (ref 150–440)
RBC: 4.89 MIL/uL (ref 4.40–5.90)
RDW: 14.7 % — ABNORMAL HIGH (ref 11.5–14.5)
WBC: 9.3 10*3/uL (ref 3.8–10.6)

## 2016-11-29 NOTE — Progress Notes (Signed)
No phlebotomy today based on parameters, patient informed. LJ

## 2016-11-30 ENCOUNTER — Encounter: Payer: Self-pay | Admitting: *Deleted

## 2016-11-30 DIAGNOSIS — I214 Non-ST elevation (NSTEMI) myocardial infarction: Secondary | ICD-10-CM | POA: Diagnosis not present

## 2016-11-30 DIAGNOSIS — Z955 Presence of coronary angioplasty implant and graft: Secondary | ICD-10-CM

## 2016-11-30 NOTE — Progress Notes (Signed)
Daily Session Note  Patient Details  Name: Andre [name] MRN: [id number] Date of Birth: January 26, 1936 Referring Provider:     Cardiac Rehab from 11/21/2016 in [facility] Cardiac and Pulmonary Rehab  Referring Provider  [name] MD      Encounter Date: 11/30/2016  Check In:     Session Check In - 11/30/16 1616      Check-In   Location [facility]-Cardiac & Pulmonary Rehab   Staff Present Gerlene Burdock, RN, Levie Heritage, MA, ACSM RCEP, Exercise Physiologist;Amanda Oletta Darter, BA, ACSM CEP, Exercise Physiologist;Joseph Alcus Dad, RN BSN   Supervising physician immediately available to respond to emergencies See telemetry face sheet for immediately available ER MD   Medication changes reported     No   Fall or balance concerns reported    No   Warm-up and Cool-down Performed on first and last piece of equipment   Resistance Training Performed Yes   VAD Patient? No     Pain Assessment   Currently in Pain? No/denies   Multiple Pain Sites No         History  Smoking Status  . Former Smoker  . Packs/day: 1.00  . Years: 40.00  . Types: Cigarettes, Pipe, Cigars  . Quit date: 04/18/1972  Smokeless Tobacco  . Former Systems developer  . Types: Chew  . Quit date: 04/18/1972    Goals Met:  Proper associated with RPD/PD & O2 Sat Exercise tolerated well No report of cardiac concerns or symptoms Strength training completed today  Goals Unmet:  Not Applicable  Comments:     Dr. Emily Filbert is Medical Director for [place] [facility].

## 2016-11-30 NOTE — Progress Notes (Signed)
Cardiac Individual Treatment Plan  Patient Details  Name: Andre [name] MRN: [id number] Date of Birth: 07-30-1935 Referring Provider:     Cardiac Rehab from 11/21/2016 in [facility] Cardiac and Pulmonary Rehab  Referring Provider  [name] MD      Initial Encounter Date:    Cardiac Rehab from 11/21/2016 in [facility] Cardiac and Pulmonary Rehab  Date  11/21/16  Referring Provider  [name] MD      Visit Diagnosis: No diagnosis found.  Patient's Home Medications on Admission:  Current Outpatient Prescriptions:  .  albuterol (VENTOLIN HFA) 108 (90 BASE) MCG/ACT inhaler, Inhale 2 puffs into the lungs every 4 (four) hours as needed for wheezing., Disp: , Rfl:  .  amiodarone (PACERONE) 200 MG tablet, Take 200 mg by mouth daily. , Disp: , Rfl:  .  apixaban (ELIQUIS) 5 MG TABS tablet, Take 5 mg by mouth 2 (two) times daily. Reported on 07/31/2015, Disp: , Rfl:  .  atorvastatin (LIPITOR) 40 MG tablet, Take 1 tablet (40 mg total) by mouth daily., Disp: 90 tablet, Rfl: 3 .  busPIRone (BUSPAR) 7.5 MG tablet, Take 7.5 mg by mouth 2 (two) times daily., Disp: , Rfl:  .  carbidopa-levodopa (PARCOPA) 25-250 MG per disintegrating tablet, Take 2 tablets by mouth 3 (three) times daily. Reported on 07/14/2015, Disp: , Rfl:  .  carvedilol (COREG) 6.25 MG tablet, TAKE ONE TABLET BY MOUTH TWICE DAILY WITH A MEAL, Disp: 60 tablet, Rfl: 3 .  clopidogrel (PLAVIX) 75 MG tablet, TAKE ONE TABLET BY MOUTH EVERY DAY WITH BREAKFAST, Disp: 30 tablet, Rfl: 3 .  cyclobenzaprine (FLEXERIL) 5 MG tablet, Take 5 mg by mouth daily., Disp: , Rfl:  .  EPINEPHrine 0.3 mg/0.3 mL IJ SOAJ injection, Inject 0.3 mg as directed once as needed (allergic reaction). Reported on 10/27/2015, Disp: , Rfl:  .  esomeprazole (NEXIUM) 20 MG capsule, Take 40 mg by mouth daily at 12 noon., Disp: , Rfl:  .  fluticasone (FLONASE) 50 MCG/ACT nasal spray, TAKE 2 PUFFS IN EACH NOSTRIL EVERY DAY, Disp: 16 g, Rfl: 3 .  furosemide (LASIX) 40 MG tablet,  Take 1/2 tablet (20 mg) by mouth once every other day, Disp: , Rfl:  .  isosorbide mononitrate (IMDUR) 30 MG 24 hr tablet, TAKE ONE TABLET BY MOUTH EVERY DAY, Disp: 30 tablet, Rfl: 3 .  levothyroxine (SYNTHROID, LEVOTHROID) 50 MCG tablet, 50 mcg daily before breakfast. , Disp: , Rfl:  .  losartan (COZAAR) 25 MG tablet, TAKE ONE TABLET BY MOUTH EVERY DAY, Disp: 30 tablet, Rfl: 3 .  mirabegron ER (MYRBETRIQ) 50 MG TB24 tablet, Take 50 mg by mouth daily., Disp: , Rfl:   Past Medical History: Past Medical History:  Diagnosis Date  . Cervical spondylosis 10/01/2013  . Chronic diastolic CHF (congestive heart failure) ([place])    a. 07/2016 Echo: >55%.  . Coronary artery disease    a. 1998 s/p mini-cabg @ [facility] - pt reports one vessel bypass to LAD;  b. 07/2016 St Echo:  Inadequate HR (max 97) w/ hypertensive response (220/96). Ex time only 2:54 - stopped due to dyspnea and leg pain;  c.  08/2016 MV: EF 67%, no ischemia, low risk.  . Depression   . GERD (gastroesophageal reflux disease)   . Hyperlipidemia   . Hypertension   . PAF (paroxysmal atrial fibrillation) ([facility])    a. s/p DCCV-->maintaining sinus on amiodarone;  b. CHA2DS2VASc = 5-->eliquis.  . Parkinson's disease ([place])    tremors  . Pulmonary  embolism ([place]) 2011  . 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 Sleep apnea    wears CPAP    Tobacco Use: History  Smoking Status  . Former Smoker  . Packs/day: 1.00  . Years: 40.00  . Types: Cigarettes, Pipe, Cigars  . Quit date: 04/18/1972  Smokeless Tobacco  . Former Systems developer  . Types: Chew  . Quit date: 04/18/1972    Labs: Recent Review Flowsheet Data    Labs for ITP Cardiac and Pulmonary Rehab Latest Ref Rng & Units 10/27/2016 10/29/2016   Cholestrol 0 - 200 mg/dL 173 183   LDLCALC 0 - 99 mg/dL 95 92   HDL >40 mg/dL 50.90 45   Trlycerides <150 mg/dL 137.0 229(H)       Exercise Target Goals:    Exercise Program Goal: Individual exercise prescription set with THRR, safety & activity  barriers. Participant demonstrates ability to understand and report RPE using BORG scale, to self-measure pulse accurately, and to acknowledge the importance of the exercise prescription.  Exercise Prescription Goal: Starting with aerobic activity 30 plus minutes a day, 3 days per week for initial exercise prescription. Provide home exercise prescription and guidelines that participant acknowledges understanding prior to discharge.  Activity Barriers & Risk Stratification:     Activity Barriers & Cardiac Risk Stratification - 11/21/16 1158      Activity Barriers & Cardiac Risk Stratification   Activity Barriers Neck/Spine Problems;Shortness of Breath;Muscular Weakness;Deconditioning   Cardiac Risk Stratification High      6 Minute Walk:     6 Minute Walk    Row Name 11/21/16 1220         6 Minute Walk   Phase Initial     Distance 58 feet     Walk Time 6 minutes     # of Rest Breaks 0     MPH 2.58     METS 2.02     RPE 16     Perceived Dyspnea  2     VO2 Peak 7.05     Symptoms Yes (comment)     Comments SOB, leg fatigue, legs feel wobbly, dizzy on turns      Resting HR 58 bpm     Resting BP 124/64     Max Ex. HR 109 bpm     Max Ex. BP 154/64     2 Minute Post BP 146/74  recheck 128/70        Oxygen Initial Assessment:     Oxygen Initial Assessment - 11/21/16 1157      Initial 6 min Walk   Oxygen Used None      Oxygen Re-Evaluation:   Oxygen Discharge (Final Oxygen Re-Evaluation):   Initial Exercise Prescription:     Initial Exercise Prescription - 11/21/16 1200      Date of Initial Exercise RX and Referring Provider   Date 11/21/16   Referring Provider [name] MD     Treadmill   MPH 1.3   Grade 0   Minutes 15   METs 2     Recumbant Bike   Level 1   RPM 50   Watts 5   Minutes 15   METs 2.14     T5 Nustep   Level 1   SPM 80   Minutes 15   METs 2     Prescription Details   Frequency (times per week) 3   Duration Progress  to 45 minutes of aerobic exercise without signs/symptoms of physical distress  Intensity   THRR 40-80% of Max Heartrate 91-124   Ratings of Perceived Exertion 11-13   Perceived Dyspnea 0-4     Progression   Progression Continue to progress workloads to maintain intensity without signs/symptoms of physical distress.     Resistance Training   Training Prescription Yes   Weight 3 lbs   Reps 10-15      Perform Capillary Blood Glucose checks as needed.  Exercise Prescription Changes:     Exercise Prescription Changes    Row Name 11/21/16 1200             Response to Exercise   Blood Pressure (Admit) 124/64       Blood Pressure (Exercise) 154/64       Blood Pressure (Exit) 146/74  recheck 128/70       Heart Rate (Admit) 58 bpm       Heart Rate (Exercise) 109 bpm       Heart Rate (Exit) 76 bpm       Oxygen Saturation (Admit) 94 %       Oxygen Saturation (Exercise) 92 %       Rating of Perceived Exertion (Exercise) 16       Perceived Dyspnea (Exercise) 2       Symptoms SOB, leg fatigue, dizzy on turns       Comments walk test results          Exercise Comments:     Exercise Comments    Row Name 11/23/16 1811           Exercise Comments First full day of exercise!  Patient was oriented to gym and equipment including functions, settings, policies, and procedures.  Patient's individual exercise prescription and treatment plan were reviewed.  All starting workloads were established based on the results of the 6 minute walk test done at initial orientation visit.  The plan for exercise progression was also introduced and progression will be customized based on patient's performance and goals.          Exercise Goals and Review:     Exercise Goals    Row Name 11/21/16 1229             Exercise Goals   Increase Physical Activity Yes       Intervention Provide advice, education, support and counseling about physical activity/exercise needs.;Develop an  individualized exercise prescription for aerobic and resistive training based on initial evaluation findings, risk stratification, comorbidities and participant's personal goals.       Expected Outcomes Achievement of increased cardiorespiratory fitness and enhanced flexibility, muscular endurance and strength shown through measurements of functional capacity and personal statement of participant.       Increase Strength and Stamina Yes       Intervention Provide advice, education, support and counseling about physical activity/exercise needs.;Develop an individualized exercise prescription for aerobic and resistive training based on initial evaluation findings, risk stratification, comorbidities and participant's personal goals.       Expected Outcomes Achievement of increased cardiorespiratory fitness and enhanced flexibility, muscular endurance and strength shown through measurements of functional capacity and personal statement of participant.          Exercise Goals Re-Evaluation :   Discharge Exercise Prescription (Final Exercise Prescription Changes):     Exercise Prescription Changes - 11/21/16 1200      Response to Exercise   Blood Pressure (Admit) 124/64   Blood Pressure (Exercise) 154/64   Blood Pressure (Exit) 146/74  recheck 128/70  Heart Rate (Admit) 58 bpm   Heart Rate (Exercise) 109 bpm   Heart Rate (Exit) 76 bpm   Oxygen Saturation (Admit) 94 %   Oxygen Saturation (Exercise) 92 %   Rating of Perceived Exertion (Exercise) 16   Perceived Dyspnea (Exercise) 2   Symptoms SOB, leg fatigue, dizzy on turns   Comments walk test results      Nutrition:  Target Goals: Understanding of nutrition guidelines, daily intake of sodium 1500mg , cholesterol 200mg , calories 30% from fat and 7% or less from saturated fats, daily to have 5 or more servings of fruits and vegetables.  Biometrics:     Pre Biometrics - 11/21/16 1229      Pre Biometrics   Height 5' 6.8" (1.697 m)    Weight 258 lb 12.8 oz (117.4 kg)   Waist Circumference 46 inches   Hip Circumference 46 inches   Waist to Hip Ratio 1 %   BMI (Calculated) 40.9   Single Leg Stand 7.53 seconds       Nutrition Therapy Plan and Nutrition Goals:     Nutrition Therapy & Goals - 11/21/16 1158      Nutrition Therapy   Drug/Food Interactions Statins/Certain Fruits     Intervention Plan   Intervention Prescribe, educate and counsel regarding individualized specific dietary modifications aiming towards targeted core components such as weight, hypertension, lipid management, diabetes, heart failure and other comorbidities.   Expected Outcomes Short Term Goal: Understand basic principles of dietary content, such as calories, fat, sodium, cholesterol and nutrients.      Nutrition Discharge: Rate Your Plate Scores:     Nutrition Assessments - 11/21/16 1158      MEDFICTS Scores   Pre Score 26      Nutrition Goals Re-Evaluation:   Nutrition Goals Discharge (Final Nutrition Goals Re-Evaluation):   Psychosocial: Target Goals: Acknowledge presence or absence of significant depression and/or stress, maximize coping skills, provide positive support system. Participant is able to verbalize types and ability to use techniques and skills needed for reducing stress and depression.   Initial Review & Psychosocial Screening:     Initial Psych Review & Screening - 11/21/16 1148      Family Dynamics   Comments Matheo reports that he has lost about 15 lbs in the past 2 months and hopes to lose more. Zymeir reported that he started running in his 22s and ran in [place] in 3 hours and 65minutes. Lashun said he got frustrated since he had heart problems after he felt like he did all that exericisng including riding his bicycle 100 miles in a day.       Quality of Life Scores:      Quality of Life - 11/21/16 1145      Quality of Life Scores   Health/Function Pre 26.8  %   Socioeconomic Pre 28.29 %   Psych/Spiritual Pre 18.86 %   Family Pre 30 %   GLOBAL Pre 25.94 %      PHQ-9: Recent Review Flowsheet Data    Depression screen [facility] 2/9 11/21/2016 09/27/2016   Decreased Interest 3 3   Down, Depressed, Hopeless 2 3   PHQ - 2 Score 5 6   Altered sleeping 0 3   Tired, decreased energy 2 3   Change in appetite 1 3   Feeling bad or failure about yourself  3 2   Trouble concentrating 0 1   Moving slowly or fidgety/restless 0 0  Suicidal thoughts 0 1   PHQ-9 Score 11 19   Difficult doing work/chores Not difficult at all -     Interpretation of Total Score  Total Score Depression Severity:  1-4 = Minimal depression, 5-9 = Mild depression, 10-14 = Moderate depression, 15-19 = Moderately severe depression, 20-27 = Severe depression   Psychosocial Evaluation and Intervention:   Psychosocial Re-Evaluation:   Psychosocial Discharge (Final Psychosocial Re-Evaluation):   Vocational Rehabilitation: Provide vocational rehab assistance to qualifying candidates.   Vocational Rehab Evaluation & Intervention:     Vocational Rehab - 11/21/16 1154      Initial Vocational Rehab Evaluation & Intervention   Assessment shows need for Vocational Rehabilitation No      Education: Education Goals: Education classes will be provided on a weekly basis, covering required topics. Participant will state understanding/return demonstration of topics presented.  Learning Barriers/Preferences:     Learning Barriers/Preferences - 11/21/16 1154      Learning Barriers/Preferences   Learning Barriers Hearing   Learning Preferences Individual Instruction      Education Topics: General Nutrition Guidelines/Fats and Fiber: -Group instruction provided by verbal, written material, models and posters to present the general guidelines for heart healthy nutrition. Gives an explanation and review of dietary fats and fiber.   Controlling Sodium/Reading Food  Labels: -Group verbal and written material supporting the discussion of sodium use in heart healthy nutrition. Review and explanation with models, verbal and written materials for utilization of the food label.   Exercise Physiology & Risk Factors: - Group verbal and written instruction with models to review the exercise physiology of the cardiovascular system and associated critical values. Details cardiovascular disease risk factors and the goals associated with each risk factor.   Aerobic Exercise & Resistance Training: - Gives group verbal and written discussion on the health impact of inactivity. On the components of aerobic and resistive training programs and the benefits of this training and how to safely progress through these programs.   Flexibility, Balance, General Exercise Guidelines: - Provides group verbal and written instruction on the benefits of flexibility and balance training programs. Provides general exercise guidelines with specific guidelines to those with heart or lung disease. Demonstration and skill practice provided.   Stress Management: - Provides group verbal and written instruction about the health risks of elevated stress, cause of high stress, and healthy ways to reduce stress.   Depression: - Provides group verbal and written instruction on the correlation between heart/lung disease and depressed mood, treatment options, and the stigmas associated with seeking treatment.   Anatomy & Physiology of the Heart: - Group verbal and written instruction and models provide basic cardiac anatomy and physiology, with the coronary electrical and arterial systems. Review of: AMI, Angina, Valve disease, Heart Failure, Cardiac Arrhythmia, Pacemakers, and the ICD.   Cardiac Procedures: - Group verbal and written instruction and models to describe the testing methods done to diagnose heart disease. Reviews the outcomes of the test results. Describes the treatment choices:  Medical Management, Angioplasty, or Coronary Bypass Surgery.   Cardiac Rehab from 11/28/2016 in [facility] Cardiac and Pulmonary Rehab  Date  11/23/16 Marisue Humble 2]  Educator  Sharman Crate 2]  Instruction Review Code  2- meets goals/outcomes      Cardiac Medications: - Group verbal and written instruction to review commonly prescribed medications for heart disease. Reviews the medication, class of the drug, and side effects. Includes the steps to properly store meds and maintain the prescription regimen.   Go Sex-Intimacy &  Heart Disease, Get SMART - Goal Setting: - Group verbal and written instruction through game format to discuss heart disease and the return to sexual intimacy. Provides group verbal and written material to discuss and apply goal setting through the application of the S.M.A.R.T. Method.   Cardiac Rehab from 11/28/2016 in [facility] Cardiac and Pulmonary Rehab  Date  11/23/16 Marisue Humble 2]  Educator  SB [Part 2]  Instruction Review Code  2- meets goals/outcomes      Other Matters of the Heart: - Provides group verbal, written materials and models to describe Heart Failure, Angina, Valve Disease, and Diabetes in the realm of heart disease. Includes description of the disease process and treatment options available to the cardiac patient.   Exercise & Equipment Safety: - Individual verbal instruction and demonstration of equipment use and safety with use of the equipment.   Cardiac Rehab from 11/28/2016 in [facility] Cardiac and Pulmonary Rehab  Date  11/21/16  Educator  C.EnterkinRN  Instruction Review Code  1- partially meets, needs review/practice      Infection Prevention: - Provides verbal and written material to individual with discussion of infection control including proper hand washing and proper equipment cleaning during exercise session.   Cardiac Rehab from 11/28/2016 in [facility] Cardiac and Pulmonary Rehab  Date  11/21/16  Educator  C. Bransford  Instruction Review Code  1- partially  meets, needs review/practice      Falls Prevention: - Provides verbal and written material to individual with discussion of falls prevention and safety.   Cardiac Rehab from 11/28/2016 in [place] Cardiac and Pulmonary Rehab  Date  11/21/16  Educator  C. Hypoluxo  Instruction Review Code  1- partially meets, needs review/practice      Diabetes: - Individual verbal and written instruction to review signs/symptoms of diabetes, desired ranges of glucose level fasting, after meals and with exercise. Advice that pre and post exercise glucose checks will be done for 3 sessions at entry of program.    Knowledge Questionnaire Score:     Knowledge Questionnaire Score - 11/21/16 1154      Knowledge Questionnaire Score   Pre Score 26/28      Core Components/Risk Factors/Patient Goals at Admission:     Personal Goals and Risk Factors at Admission - 11/21/16 1146      Core Components/Risk Factors/Patient Goals on Admission    Weight Management Yes;Obesity;Weight Loss   Intervention Weight Management: Develop a combined nutrition and exercise program designed to reach desired caloric intake, while maintaining appropriate intake of nutrient and fiber, sodium and fats, and appropriate energy expenditure required for the weight goal.;Weight Management: Provide education and appropriate resources to help participant work on and attain dietary goals.;Weight Management/Obesity: Establish reasonable short term and long term weight goals.  Mitch reports that he has lost about 15 lbs in the past 2 months and hopes to lose more. Tomie reported that he started running in his 51s and ran in [place] in 3 hours and 75minutes.   Admit Weight 258 lb 12.8 oz (117.4 kg)   Goal Weight: Short Term 250 lb (113.4 kg)   Goal Weight: Long Term 200 lb (90.7 kg)   Expected Outcomes Short Term: Continue to assess and modify interventions until short term weight is achieved;Long  Term: Adherence to nutrition and physical activity/exercise program aimed toward attainment of established weight goal;Weight Loss: Understanding of general recommendations for a balanced deficit meal plan, which promotes 1-2 lb weight loss per week  and includes a negative energy balance of [phone] kcal/d;Understanding of distribution of calorie intake throughout the day with the consumption of 4-5 meals/snacks;Understanding recommendations for meals to include 15-35% energy as protein, 25-35% energy from fat, 35-60% energy from carbohydrates, less than 200mg  of dietary cholesterol, 20-35 gm of total fiber daily   Heart Failure Yes   Intervention Provide a combined exercise and nutrition program that is supplemented with education, support and counseling about heart failure. Directed toward relieving symptoms such as shortness of breath, decreased exercise tolerance, and extremity edema.   Expected Outcomes Improve functional capacity of life;Short term: Attendance in program 2-3 days a week with increased exercise capacity. Reported lower sodium intake. Reported increased fruit and vegetable intake. Reports medication compliance.;Short term: Daily weights obtained and reported for increase. Utilizing diuretic protocols set by physician.;Long term: Adoption of self-care skills and reduction of barriers for early signs and symptoms recognition and intervention leading to self-care maintenance.   Hypertension Yes   Intervention Provide education on lifestyle modifcations including regular physical activity/exercise, weight management, moderate sodium restriction and increased consumption of fresh fruit, vegetables, and low fat dairy, alcohol moderation, and smoking cessation.;Monitor prescription use compliance.   Expected Outcomes Long Term: Maintenance of blood pressure at goal levels.;Short Term: Continued assessment and intervention until BP is < 140/24mm HG in hypertensive participants. < 130/29mm HG in  hypertensive participants with diabetes, heart failure or chronic kidney disease.   Lipids Yes   Intervention Provide education and support for participant on nutrition & aerobic/resistive exercise along with prescribed medications to achieve LDL 70mg , HDL >40mg .   Expected Outcomes Short Term: Participant states understanding of desired cholesterol values and is compliant with medications prescribed. Participant is following exercise prescription and nutrition guidelines.;Long Term: Cholesterol controlled with medications as prescribed, with individualized exercise RX and with personalized nutrition plan. Value goals: LDL < 70mg , HDL > 40 mg.      Core Components/Risk Factors/Patient Goals Review:    Core Components/Risk Factors/Patient Goals at Discharge (Final Review):    ITP Comments:     ITP Comments    Row Name 11/21/16 1234 11/30/16 6606         ITP Comments Medical Review completed.  Diagnosis documentation can be found in [facility] encounter 11/11/16 30 day review. Continue with ITP unless directed changes per Medical Director review   New to program         Comments:

## 2016-11-30 NOTE — Progress Notes (Signed)
Daily Session Note  Patient Details  Name: Andre [name] MRN: [id number] Date of Birth: 09/28/1935 Referring Provider:     Cardiac Rehab from 11/21/2016 in [facility] Cardiac and Pulmonary Rehab  Referring Provider  [name] MD      Encounter Date: 11/30/2016  Check In:     Session Check In - 11/30/16 1616      Check-In   Location [facility]-Cardiac & Pulmonary Rehab   Staff Present Gerlene Burdock, RN, Levie Heritage, MA, ACSM RCEP, Exercise Physiologist;Amanda Oletta Darter, BA, ACSM CEP, Exercise Physiologist;Pat Elicker Alcus Dad, RN BSN   Supervising physician immediately available to respond to emergencies See telemetry face sheet for immediately available ER MD   Medication changes reported     No   Fall or balance concerns reported    No   Warm-up and Cool-down Performed on first and last piece of equipment   Resistance Training Performed Yes   VAD Patient? No     Pain Assessment   Currently in Pain? No/denies   Multiple Pain Sites No         History  Smoking Status  . Former Smoker  . Packs/day: 1.00  . Years: 40.00  . Types: Cigarettes, Pipe, Cigars  . Quit date: 04/18/1972  Smokeless Tobacco  . Former Systems developer  . Types: Chew  . Quit date: 04/18/1972    Goals Met:  Exercise tolerated well No report of cardiac concerns or symptoms Strength training completed today  Goals Unmet:  Not Applicable  Comments: Pt able to follow exercise prescription today without complaint.  Will continue to monitor for progression.   Dr. Emily Filbert is Medical Director for [facility].

## 2016-12-01 ENCOUNTER — Other Ambulatory Visit: Payer: Self-pay | Admitting: Family Medicine

## 2016-12-03 DIAGNOSIS — G4733 Obstructive sleep apnea (adult) (pediatric): Secondary | ICD-10-CM | POA: Diagnosis not present

## 2016-12-05 ENCOUNTER — Encounter: Payer: PPO | Admitting: *Deleted

## 2016-12-05 DIAGNOSIS — Z955 Presence of coronary angioplasty implant and graft: Secondary | ICD-10-CM

## 2016-12-05 DIAGNOSIS — I214 Non-ST elevation (NSTEMI) myocardial infarction: Secondary | ICD-10-CM | POA: Diagnosis not present

## 2016-12-05 NOTE — Progress Notes (Signed)
Daily Session Note  Patient Details  Name: Andre [name] MRN: [id number] Date of Birth: 09-10-1935 Referring Provider:     Cardiac Rehab from 11/21/2016 in [facility]  Referring Provider  [name] MD      Encounter Date: 12/05/2016  Check In:     Session Check In - 12/05/16 1624      Check-In   Location [facility]   Staff Present Nyoka Cowden, RN, BSN, MA;Meredith Sherryll Burger, RN Moises Blood, BS, ACSM CEP, Exercise Physiologist   Supervising physician immediately available to respond to emergencies See telemetry face sheet for immediately available ER MD   Medication changes reported     No   Fall or balance concerns reported    No   Warm-up and Cool-down Performed on first and last piece of equipment   Resistance Training Performed Yes   VAD Patient? No     Pain Assessment   Currently in Pain? No/denies   Multiple Pain Sites No         History  Smoking Status  . Former Smoker  . Packs/day: 1.00  . Years: 40.00  . Types: Cigarettes, Pipe, Cigars  . Quit date: 04/18/1972  Smokeless Tobacco  . Former Systems developer  . Types: Chew  . Quit date: 04/18/1972    Goals Met:  Proper associated with RPD/PD & O2 Sat [place] with exercise equipment Exercise tolerated well No report of cardiac concerns or symptoms Strength training completed today  Goals Unmet:  Not Applicable  Comments: Pt able to follow exercise prescription today without complaint.  Will continue to monitor for progression.    Dr. Emily Filbert is Medical Director for [place] [facility] and [facility].

## 2016-12-07 ENCOUNTER — Telehealth: Payer: Self-pay | Admitting: *Deleted

## 2016-12-07 DIAGNOSIS — L570 Actinic keratosis: Secondary | ICD-10-CM | POA: Diagnosis not present

## 2016-12-07 DIAGNOSIS — D692 Other nonthrombocytopenic purpura: Secondary | ICD-10-CM | POA: Diagnosis not present

## 2016-12-07 DIAGNOSIS — L304 Erythema intertrigo: Secondary | ICD-10-CM | POA: Diagnosis not present

## 2016-12-07 DIAGNOSIS — L820 Inflamed seborrheic keratosis: Secondary | ICD-10-CM | POA: Diagnosis not present

## 2016-12-07 DIAGNOSIS — Z85828 Personal history of other malignant neoplasm of skin: Secondary | ICD-10-CM | POA: Diagnosis not present

## 2016-12-08 ENCOUNTER — Encounter: Payer: PPO | Admitting: *Deleted

## 2016-12-16 ENCOUNTER — Telehealth: Payer: Self-pay | Admitting: Cardiovascular Disease

## 2016-12-16 NOTE — Telephone Encounter (Signed)
She states that the patient is breathing fine at this time. She reports that when he walks on the treadmill for about 5 minutes he has some shortness of breath with dizziness that goes away after sitting down for about 30 seconds. She reports that his oxygen levels have been normal and no other problems or symptoms at this time. She just wanted to see if someone could see him today and listen to his lungs to make sure everything sounds good. She reports that he does not have any wheezing just some shortness of breath after walking on treadmill. Advised her that we do not have appointment open today. Instructed her to have him seek evaluation in the ED if his breathing should worsen. Let her know that this could be some deconditioning and a result of being out of shape. She states that his weights, heart rate, and blood pressures have been stable and no other complaints at this time. She is going to try calling pulmonary for appointment and let her know to give [place] a call back if he should develop any further symptoms or concerns. She verbalized understanding with no further questions at this time.

## 2016-12-16 NOTE — Telephone Encounter (Signed)
Some shortness of breath is expected with exercise. Why doesn't he exercise at cardiac rehab? They can listen to his lungs there.

## 2016-12-16 NOTE — Telephone Encounter (Signed)
Pt c/o Shortness Of Breath: STAT if SOB developed within the last 24 hours or pt is noticeably SOB on the phone  1. Are you currently SOB (can you hear that pt is SOB on the phone)? No resting on couch   2. How long have you been experiencing SOB? 1 week   3. Are you SOB when sitting or when up moving around?  When up moving around - Trying to work out on treadmill (this is when it happens mostly)  4. Are you currently experiencing any other symptoms?   Dizzy at times

## 2016-12-20 ENCOUNTER — Telehealth: Payer: Self-pay | Admitting: Cardiovascular Disease

## 2016-12-20 NOTE — Telephone Encounter (Signed)
Pt c/o Shortness Of Breath: STAT if SOB developed within the last 24 hours or pt is noticeably SOB on the phone  1. Are you currently SOB (can you hear that pt is SOB on the phone)? Pt is currently sleeping  2. How long have you been experiencing SOB? Several day  3. Are you SOB when sitting or when up moving around? *moving aroung  4. Are you currently experiencing any other symptoms? No other symptoms,

## 2016-12-20 NOTE — Telephone Encounter (Signed)
Ok thanks 

## 2016-12-20 NOTE — Telephone Encounter (Signed)
Returned call to patient and wife. Patient is concerned because he gets short of breath with activities such as bending over to work on his lawnmower or mowing the yard. He will come in and it will take a few minutes to take a deep breath. His O2 starts out around 91%, HR 71 and after 15-20 seconds will increase to around 97%, HR 56.  He denies chest pain or dizziness. Let him know Dr Tyrell Antonio recommendations from telephone encounter on 12/16/16: "Some shortness of breath is expected with exercise. Why doesn't he exercise at cardiac rehab? They can listen to his lungs there." He verbalized understanding. Suggested that he exert himself more at rehab when necessary where he is being monitored and take it slowly when at home. He may need to do activities in shorter increments of time and take more frequent rests. He agreed. Pt verbalized understanding to call 911 or go to the emergency room, if he develops any new or worsening symptoms. Patient asked if he should schedule with Dr Mortimer Fries his pulmonologist. Patient last saw him in May and was to f/u in 3 months. Patient transferred to scheduler to make f/u appt with Dr Mortimer Fries on 12/29/16. Routing to Dr Fletcher Anon for review.

## 2016-12-21 ENCOUNTER — Encounter: Payer: PPO | Attending: Cardiovascular Disease

## 2016-12-21 DIAGNOSIS — F329 Major depressive disorder, single episode, unspecified: Secondary | ICD-10-CM | POA: Insufficient documentation

## 2016-12-21 DIAGNOSIS — Z955 Presence of coronary angioplasty implant and graft: Secondary | ICD-10-CM | POA: Insufficient documentation

## 2016-12-21 DIAGNOSIS — D751 Secondary polycythemia: Secondary | ICD-10-CM | POA: Insufficient documentation

## 2016-12-21 DIAGNOSIS — G4730 Sleep apnea, unspecified: Secondary | ICD-10-CM | POA: Insufficient documentation

## 2016-12-21 DIAGNOSIS — Z87891 Personal history of nicotine dependence: Secondary | ICD-10-CM | POA: Insufficient documentation

## 2016-12-21 DIAGNOSIS — Z86711 Personal history of pulmonary embolism: Secondary | ICD-10-CM | POA: Insufficient documentation

## 2016-12-21 DIAGNOSIS — Z7901 Long term (current) use of anticoagulants: Secondary | ICD-10-CM | POA: Insufficient documentation

## 2016-12-21 DIAGNOSIS — I2510 Atherosclerotic heart disease of native coronary artery without angina pectoris: Secondary | ICD-10-CM | POA: Insufficient documentation

## 2016-12-21 DIAGNOSIS — E785 Hyperlipidemia, unspecified: Secondary | ICD-10-CM | POA: Insufficient documentation

## 2016-12-21 DIAGNOSIS — Z7951 Long term (current) use of inhaled steroids: Secondary | ICD-10-CM | POA: Insufficient documentation

## 2016-12-21 DIAGNOSIS — I214 Non-ST elevation (NSTEMI) myocardial infarction: Secondary | ICD-10-CM | POA: Insufficient documentation

## 2016-12-21 DIAGNOSIS — Z79899 Other long term (current) drug therapy: Secondary | ICD-10-CM | POA: Insufficient documentation

## 2016-12-21 DIAGNOSIS — K219 Gastro-esophageal reflux disease without esophagitis: Secondary | ICD-10-CM | POA: Insufficient documentation

## 2016-12-21 DIAGNOSIS — I480 Paroxysmal atrial fibrillation: Secondary | ICD-10-CM | POA: Insufficient documentation

## 2016-12-21 DIAGNOSIS — Z951 Presence of aortocoronary bypass graft: Secondary | ICD-10-CM | POA: Insufficient documentation

## 2016-12-21 DIAGNOSIS — I110 Hypertensive heart disease with heart failure: Secondary | ICD-10-CM | POA: Insufficient documentation

## 2016-12-21 DIAGNOSIS — I5032 Chronic diastolic (congestive) heart failure: Secondary | ICD-10-CM | POA: Insufficient documentation

## 2016-12-21 DIAGNOSIS — Z7902 Long term (current) use of antithrombotics/antiplatelets: Secondary | ICD-10-CM | POA: Insufficient documentation

## 2016-12-21 DIAGNOSIS — G20 Parkinson's disease: Secondary | ICD-10-CM | POA: Insufficient documentation

## 2016-12-22 ENCOUNTER — Telehealth: Payer: Self-pay

## 2016-12-22 NOTE — Telephone Encounter (Signed)
LMOM

## 2016-12-23 ENCOUNTER — Encounter: Payer: Self-pay | Admitting: *Deleted

## 2016-12-23 ENCOUNTER — Telehealth: Payer: Self-pay | Admitting: *Deleted

## 2016-12-23 NOTE — Telephone Encounter (Signed)
[name] called to inform staff that he will not be able to attend class 8/22. He hopes to be back soon.

## 2016-12-23 NOTE — Telephone Encounter (Signed)
I left a vm on his cell phone that we hope to see him soon back in Cardiac Rehab. I said I saw the note from his doctors' office and I believe it will help his breathing.

## 2016-12-28 ENCOUNTER — Encounter: Payer: Self-pay | Admitting: *Deleted

## 2016-12-28 DIAGNOSIS — Z955 Presence of coronary angioplasty implant and graft: Secondary | ICD-10-CM

## 2016-12-28 NOTE — Progress Notes (Signed)
Cardiac Individual Treatment Plan  Patient Details  Name: Andre [name] MRN: [id number] Date of Birth: 07/04/1935 Referring Provider:     Cardiac Rehab from 11/21/2016 in [facility] Cardiac and Pulmonary Rehab  Referring Provider  [name] MD      Initial Encounter Date:    Cardiac Rehab from 11/21/2016 in [facility] Cardiac and Pulmonary Rehab  Date  11/21/16  Referring Provider  [name] MD      Visit Diagnosis: Status post coronary artery stent placement  Patient's Home Medications on Admission:  Current Outpatient Prescriptions:  .  amiodarone (PACERONE) 200 MG tablet, Take 200 mg by mouth daily. , Disp: , Rfl:  .  apixaban (ELIQUIS) 5 MG TABS tablet, Take 5 mg by mouth 2 (two) times daily. Reported on 07/31/2015, Disp: , Rfl:  .  atorvastatin (LIPITOR) 40 MG tablet, Take 1 tablet (40 mg total) by mouth daily., Disp: 90 tablet, Rfl: 3 .  busPIRone (BUSPAR) 7.5 MG tablet, Take 7.5 mg by mouth 2 (two) times daily., Disp: , Rfl:  .  carbidopa-levodopa (PARCOPA) 25-250 MG per disintegrating tablet, Take 2 tablets by mouth 3 (three) times daily. Reported on 07/14/2015, Disp: , Rfl:  .  carvedilol (COREG) 6.25 MG tablet, TAKE ONE TABLET BY MOUTH TWICE DAILY WITH A MEAL, Disp: 60 tablet, Rfl: 3 .  clopidogrel (PLAVIX) 75 MG tablet, TAKE ONE TABLET BY MOUTH EVERY DAY WITH BREAKFAST, Disp: 30 tablet, Rfl: 3 .  cyclobenzaprine (FLEXERIL) 5 MG tablet, Take 5 mg by mouth daily., Disp: , Rfl:  .  EPINEPHrine 0.3 mg/0.3 mL IJ SOAJ injection, Inject 0.3 mg as directed once as needed (allergic reaction). Reported on 10/27/2015, Disp: , Rfl:  .  esomeprazole (NEXIUM) 20 MG capsule, Take 40 mg by mouth daily at 12 noon., Disp: , Rfl:  .  fluticasone (FLONASE) 50 MCG/ACT nasal spray, TAKE 2 PUFFS IN EACH NOSTRIL EVERY DAY, Disp: 16 g, Rfl: 3 .  furosemide (LASIX) 40 MG tablet, Take 1/2 tablet (20 mg) by mouth once every other day, Disp: , Rfl:  .  isosorbide mononitrate (IMDUR) 30 MG 24 hr tablet, TAKE  ONE TABLET BY MOUTH EVERY DAY, Disp: 30 tablet, Rfl: 3 .  levothyroxine (SYNTHROID, LEVOTHROID) 50 MCG tablet, 50 mcg daily before breakfast. , Disp: , Rfl:  .  losartan (COZAAR) 25 MG tablet, TAKE ONE TABLET BY MOUTH EVERY DAY, Disp: 30 tablet, Rfl: 3 .  mirabegron ER (MYRBETRIQ) 50 MG TB24 tablet, Take 50 mg by mouth daily., Disp: , Rfl:  .  VENTOLIN HFA 108 (90 Base) MCG/ACT inhaler, TAKE 2 PUFFS EVERY 8 HOURS AS NEEDED FORWHEEZING, Disp: 18 g, Rfl: 3  Past Medical History: Past Medical History:  Diagnosis Date  . Cervical spondylosis 10/01/2013  . Chronic diastolic CHF (congestive heart failure) ([place])    a. 07/2016 Echo: >55%.  . Coronary artery disease    a. 1998 s/p mini-cabg @ [facility] - pt reports one vessel bypass to LAD;  b. 07/2016 St Echo:  Inadequate HR (max 97) w/ hypertensive response (220/96). Ex time only 2:54 - stopped due to dyspnea and leg pain;  c.  08/2016 MV: EF 67%, no ischemia, low risk.  . Depression   . GERD (gastroesophageal reflux disease)   . Hyperlipidemia   . Hypertension   . PAF (paroxysmal atrial fibrillation) ([facility])    a. s/p DCCV-->maintaining sinus on amiodarone;  b. CHA2DS2VASc = 5-->eliquis.  . Parkinson's disease ([place])    tremors  . Pulmonary embolism ([place])  2011  . 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 Sleep apnea    wears CPAP    Tobacco Use: History  Smoking Status  . Former Smoker  . Packs/day: 1.00  . Years: 40.00  . Types: Cigarettes, Pipe, Cigars  . Quit date: 04/18/1972  Smokeless Tobacco  . Former Systems developer  . Types: Chew  . Quit date: 04/18/1972    Labs: Recent Review Flowsheet Data    Labs for ITP Cardiac and Pulmonary Rehab Latest Ref Rng & Units 10/27/2016 10/29/2016   Cholestrol 0 - 200 mg/dL 173 183   LDLCALC 0 - 99 mg/dL 95 92   HDL >40 mg/dL 50.90 45   Trlycerides <150 mg/dL 137.0 229(H)       Exercise Target Goals:    Exercise Program Goal: Individual exercise prescription set with THRR, safety & activity barriers.  Participant demonstrates ability to understand and report RPE using BORG scale, to self-measure pulse accurately, and to acknowledge the importance of the exercise prescription.  Exercise Prescription Goal: Starting with aerobic activity 30 plus minutes a day, 3 days per week for initial exercise prescription. Provide home exercise prescription and guidelines that participant acknowledges understanding prior to discharge.  Activity Barriers & Risk Stratification:     Activity Barriers & Cardiac Risk Stratification - 11/21/16 1158      Activity Barriers & Cardiac Risk Stratification   Activity Barriers Neck/Spine Problems;Shortness of Breath;Muscular Weakness;Deconditioning   Cardiac Risk Stratification High      6 Minute Walk:     6 Minute Walk    Row Name 11/21/16 1220         6 Minute Walk   Phase Initial     Distance 58 feet     Walk Time 6 minutes     # of Rest Breaks 0     MPH 2.58     METS 2.02     RPE 16     Perceived Dyspnea  2     VO2 Peak 7.05     Symptoms Yes (comment)     Comments SOB, leg fatigue, legs feel wobbly, dizzy on turns      Resting HR 58 bpm     Resting BP 124/64     Max Ex. HR 109 bpm     Max Ex. BP 154/64     2 Minute Post BP 146/74  recheck 128/70        Oxygen Initial Assessment:     Oxygen Initial Assessment - 11/21/16 1157      Initial 6 min Walk   Oxygen Used None      Oxygen Re-Evaluation:   Oxygen Discharge (Final Oxygen Re-Evaluation):   Initial Exercise Prescription:     Initial Exercise Prescription - 11/21/16 1200      Date of Initial Exercise RX and Referring Provider   Date 11/21/16   Referring Provider [name] MD     Treadmill   MPH 1.3   Grade 0   Minutes 15   METs 2     Recumbant Bike   Level 1   RPM 50   Watts 5   Minutes 15   METs 2.14     T5 Nustep   Level 1   SPM 80   Minutes 15   METs 2     Prescription Details   Frequency (times per week) 3   Duration Progress to 45  minutes of aerobic exercise without signs/symptoms of physical distress     Intensity  THRR 40-80% of Max Heartrate 91-124   Ratings of Perceived Exertion 11-13   Perceived Dyspnea 0-4     Progression   Progression Continue to progress workloads to maintain intensity without signs/symptoms of physical distress.     Resistance Training   Training Prescription Yes   Weight 3 lbs   Reps 10-15      Perform Capillary Blood Glucose checks as needed.  Exercise Prescription Changes:     Exercise Prescription Changes    Row Name 11/21/16 1200 12/09/16 1300           Response to Exercise   Blood Pressure (Admit) 124/64 140/64      Blood Pressure (Exercise) 154/64 154/78      Blood Pressure (Exit) 146/74  recheck 128/70 134/82      Heart Rate (Admit) 58 bpm 61 bpm      Heart Rate (Exercise) 109 bpm 96 bpm      Heart Rate (Exit) 76 bpm 72 bpm      Oxygen Saturation (Admit) 94 %  -      Oxygen Saturation (Exercise) 92 %  -      Rating of Perceived Exertion (Exercise) 16 13      Perceived Dyspnea (Exercise) 2  -      Symptoms SOB, leg fatigue, dizzy on turns none      Comments walk test results  -      Duration  - Progress to 45 minutes of aerobic exercise without signs/symptoms of physical distress      Intensity  - THRR unchanged        Progression   Progression  - Continue to progress workloads to maintain intensity without signs/symptoms of physical distress.      Average METs  - 2.05        Resistance Training   Training Prescription  - Yes      Weight  - 3 lbs.      Reps  - 10-15        Interval Training   Interval Training  - No        Treadmill   MPH  - 1.3      Grade  - 0      Minutes  - 15      METs  - 2        Recumbant Bike   Level  - 1      RPM  - 50      Watts  - 36      Minutes  - 15      METs  - 2.92         Exercise Comments:     Exercise Comments    Row Name 11/23/16 1811           Exercise Comments First full day of exercise!   Patient was oriented to gym and equipment including functions, settings, policies, and procedures.  Patient's individual exercise prescription and treatment plan were reviewed.  All starting workloads were established based on the results of the 6 minute walk test done at initial orientation visit.  The plan for exercise progression was also introduced and progression will be customized based on patient's performance and goals.          Exercise Goals and Review:     Exercise Goals    Row Name 11/21/16 1229             Exercise Goals   Increase Physical Activity Yes  Intervention Provide advice, education, support and counseling about physical activity/exercise needs.;Develop an individualized exercise prescription for aerobic and resistive training based on initial evaluation findings, risk stratification, comorbidities and participant's personal goals.       Expected Outcomes Achievement of increased cardiorespiratory fitness and enhanced flexibility, muscular endurance and strength shown through measurements of functional capacity and personal statement of participant.       Increase Strength and Stamina Yes       Intervention Provide advice, education, support and counseling about physical activity/exercise needs.;Develop an individualized exercise prescription for aerobic and resistive training based on initial evaluation findings, risk stratification, comorbidities and participant's personal goals.       Expected Outcomes Achievement of increased cardiorespiratory fitness and enhanced flexibility, muscular endurance and strength shown through measurements of functional capacity and personal statement of participant.          Exercise Goals Re-Evaluation :     Exercise Goals Re-Evaluation    Row Name 12/09/16 1308 12/23/16 0953           Exercise Goal Re-Evaluation   Exercise Goals Review Increase Physical Activity;Increase Strenth and Stamina  -      Comments Thales is  tolerating exercise well. Out since last review.      Expected Outcomes Short - Kyrese will attend class  regularly.  Long - Letcher will increase overall fitness level.  -         Discharge Exercise Prescription (Final Exercise Prescription Changes):     Exercise Prescription Changes - 12/09/16 1300      Response to Exercise   Blood Pressure (Admit) 140/64   Blood Pressure (Exercise) 154/78   Blood Pressure (Exit) 134/82   Heart Rate (Admit) 61 bpm   Heart Rate (Exercise) 96 bpm   Heart Rate (Exit) 72 bpm   Rating of Perceived Exertion (Exercise) 13   Symptoms none   Duration Progress to 45 minutes of aerobic exercise without signs/symptoms of physical distress   Intensity THRR unchanged     Progression   Progression Continue to progress workloads to maintain intensity without signs/symptoms of physical distress.   Average METs 2.05     Resistance Training   Training Prescription Yes   Weight 3 lbs.   Reps 10-15     Interval Training   Interval Training No     Treadmill   MPH 1.3   Grade 0   Minutes 15   METs 2     Recumbant Bike   Level 1   RPM 50   Watts 36   Minutes 15   METs 2.92      Nutrition:  Target Goals: Understanding of nutrition guidelines, daily intake of sodium <1568m, cholesterol <208m calories 30% from fat and 7% or less from saturated fats, daily to have 5 or more servings of fruits and vegetables.  Biometrics:     Pre Biometrics - 11/21/16 1229      Pre Biometrics   Height 5' 6.8" (1.697 m)   Weight 258 lb 12.8 oz (117.4 kg)   Waist Circumference 46 inches   Hip Circumference 46 inches   Waist to Hip Ratio 1 %   BMI (Calculated) 40.9   Single Leg Stand 7.53 seconds       Nutrition Therapy Plan and Nutrition Goals:     Nutrition Therapy & Goals - 12/23/16 1224      Nutrition Therapy   RD appointment defered Yes      Nutrition Discharge: Rate Your  Plate Scores:     Nutrition Assessments - 11/21/16 1158      MEDFICTS  Scores   Pre Score 26      Nutrition Goals Re-Evaluation:   Nutrition Goals Discharge (Final Nutrition Goals Re-Evaluation):   Psychosocial: Target Goals: Acknowledge presence or absence of significant depression and/or stress, maximize coping skills, provide positive support system. Participant is able to verbalize types and ability to use techniques and skills needed for reducing stress and depression.   Initial Review & Psychosocial Screening:     Initial Psych Review & Screening - 11/21/16 1148      Family Dynamics   Comments Asad reports that he has lost about 15 lbs in the past 2 months and hopes to lose more. Aj reported that he started running in his 17s and ran in [place] in 3 hours and 85mnutes. FAmaresaid he got frustrated since he had heart problems after he felt like he did all that exericisng including riding his bicycle 100 miles in a day.       Quality of Life Scores:      Quality of Life - 11/21/16 1145      Quality of Life Scores   Health/Function Pre 26.8 %   Socioeconomic Pre 28.29 %   Psych/Spiritual Pre 18.86 %   Family Pre 30 %   GLOBAL Pre 25.94 %      PHQ-9: Recent Review Flowsheet Data    Depression screen [facility] 11/21/2016 09/27/2016   Decreased Interest 3 3   Down, Depressed, Hopeless 2 3   PHQ - 2 Score 5 6   Altered sleeping 0 3   Tired, decreased energy 2 3   Change in appetite 1 3   Feeling bad or failure about yourself  3 2   Trouble concentrating 0 1   Moving slowly or fidgety/restless 0 0   Suicidal thoughts 0 1   PHQ-9 Score 11 19   Difficult doing work/chores Not difficult at all -     Interpretation of Total Score  Total Score Depression Severity:  1-4 = Minimal depression, 5-9 = Mild depression, 10-14 = Moderate depression, 15-19 = Moderately severe depression, 20-27 = Severe depression   Psychosocial Evaluation and Intervention:     Psychosocial Evaluation - 11/30/16 1651       Psychosocial Evaluation & Interventions   Interventions Encouraged to exercise with the program and follow exercise prescription;Stress management education;Relaxation education   Comments Counselor met with Mr. DHackbarton 8/8 for initial psychosocial evaluation.  The EPIC system was down that day and counselor was unable to put this information in at that time.  FThayneis a 81year old that had a heart attack with an inserted stent on 7/13.  He has a strong support system with a spouse of 40 years and a step-daughter locally.  He has multiple health issues other than his heart; with Parkinson's diagnosed approximately 20 years ago; sleep apnea and being overweight.  FCordarostates he sleeps well with the use of his CPAP machine; and he has a good appetite most of the time.  FJahmeerdenies a history of depression or anxiety; but has noticed some symptoms of depression since the heart attack  that he reports are improving somewhat.  His mood is generally positive and he reports other than his health, he has minimal stress in his life.  FTerrinhas goals to increase his stamina and strength and improve his mood while  in this program.  Staff will follow with Dennies throughout his time in Cardiac Rehab.     Expected Outcomes Axcel will benefit from consistent exercise to achieve his stated goals.  Meeting with the dietician will help him develop a plan for addressing his weight loss goals.  Serafin will also benefit from the educational and psychoeducational components of this program to help him manage and cope with his health concerns.     Continue Psychosocial Services  Follow up required by staff      Psychosocial Re-Evaluation:   Psychosocial Discharge (Final Psychosocial Re-Evaluation):   Vocational Rehabilitation: Provide vocational rehab assistance to qualifying candidates.   Vocational Rehab Evaluation & Intervention:     Vocational Rehab - 11/21/16 1154      Initial Vocational Rehab Evaluation  & Intervention   Assessment shows need for Vocational Rehabilitation No      Education: Education Goals: Education classes will be provided on a variety of topics geared toward better understanding of heart health and risk factor modification. Participant will state understanding/return demonstration of topics presented as noted by education test scores.  Learning Barriers/Preferences:     Learning Barriers/Preferences - 11/21/16 1154      Learning Barriers/Preferences   Learning Barriers Hearing   Learning Preferences Individual Instruction      Education Topics: General Nutrition Guidelines/Fats and Fiber: -Group instruction provided by verbal, written material, models and posters to present the general guidelines for heart healthy nutrition. Gives an explanation and review of dietary fats and fiber.   Cardiac Rehab from 12/05/2016 in [facility] Cardiac and Pulmonary Rehab  Date  12/05/16  Educator  PI  Instruction Review Code (retired)  2- meets goals/outcomes      Controlling Sodium/Reading Food Labels: -Group verbal and written material supporting the discussion of sodium use in heart healthy nutrition. Review and explanation with models, verbal and written materials for utilization of the food label.   Exercise Physiology & Risk Factors: - Group verbal and written instruction with models to review the exercise physiology of the cardiovascular system and associated critical values. Details cardiovascular disease risk factors and the goals associated with each risk factor.   Aerobic Exercise & Resistance Training: - Gives group verbal and written discussion on the health impact of inactivity. On the components of aerobic and resistive training programs and the benefits of this training and how to safely progress through these programs.   Flexibility, Balance, General Exercise Guidelines: - Provides group verbal and written instruction on the benefits of flexibility and balance  training programs. Provides general exercise guidelines with specific guidelines to those with heart or lung disease. Demonstration and skill practice provided.   Stress Management: - Provides group verbal and written instruction about the health risks of elevated stress, cause of high stress, and healthy ways to reduce stress.   Depression: - Provides group verbal and written instruction on the correlation between heart/lung disease and depressed mood, treatment options, and the stigmas associated with seeking treatment.   Anatomy & Physiology of the Heart: - Group verbal and written instruction and models provide basic cardiac anatomy and physiology, with the coronary electrical and arterial systems. Review of: AMI, Angina, Valve disease, Heart Failure, Cardiac Arrhythmia, Pacemakers, and the ICD.   Cardiac Procedures: - Group verbal and written instruction to review commonly prescribed medications for heart disease. Reviews the medication, class of the drug, and side effects. Includes the steps to properly store meds and maintain the prescription regimen. (beta blockers and nitrates)  Cardiac Rehab from 12/05/2016 in [facility] Cardiac and Pulmonary Rehab  Date  11/23/16 Marisue Humble 2]  Educator  SB [Part 2]  Instruction Review Code (retired)  2- meets goals/outcomes      Cardiac Medications I: - Group verbal and written instruction to review commonly prescribed medications for heart disease. Reviews the medication, class of the drug, and side effects. Includes the steps to properly store meds and maintain the prescription regimen.   Cardiac Medications II: -Group verbal and written instruction to review commonly prescribed medications for heart disease. Reviews the medication, class of the drug, and side effects. (all other drug classes)    Go Sex-Intimacy & Heart Disease, Get SMART - Goal Setting: - Group verbal and written instruction through game format to discuss heart disease and the  return to sexual intimacy. Provides group verbal and written material to discuss and apply goal setting through the application of the S.M.A.R.T. Method.   Cardiac Rehab from 12/05/2016 in [facility] Cardiac and Pulmonary Rehab  Date  11/23/16 Marisue Humble 2]  Educator  SB [Part 2]  Instruction Review Code (retired)  2- meets goals/outcomes      Other Matters of the Heart: - Provides group verbal, written materials and models to describe Heart Failure, Angina, Valve Disease, Peripheral Artery Disease, and Diabetes in the realm of heart disease. Includes description of the disease process and treatment options available to the cardiac patient.   Exercise & Equipment Safety: - Individual verbal instruction and demonstration of equipment use and safety with use of the equipment.   Cardiac Rehab from 12/05/2016 in [facility] Cardiac and Pulmonary Rehab  Date  11/21/16  Educator  C.[place]erta  Instruction Review Code (retired)  1- partially meets, needs review/practice      Infection Prevention: - Provides verbal and written material to individual with discussion of infection control including proper hand washing and proper equipment cleaning during exercise session.   Cardiac Rehab from 12/05/2016 in [facility] Cardiac and Pulmonary Rehab  Date  11/21/16  Educator  C. Piney  Instruction Review Code (retired)  1- partially meets, needs review/practice      Falls Prevention: - Provides verbal and written material to individual with discussion of falls prevention and safety.   Cardiac Rehab from 12/05/2016 in [facility] Cardiac and Pulmonary Rehab  Date  11/21/16  Educator  C. Marvell  Instruction Review Code (retired)  1- partially meets, needs review/practice      Diabetes: - Individual verbal and written instruction to review signs/symptoms of diabetes, desired ranges of glucose level fasting, after meals and with exercise. Acknowledge that pre and post exercise glucose checks will be done for 3 sessions at  entry of program.   Other: -Provides group and verbal instruction on various topics (see comments)    Knowledge Questionnaire Score:     Knowledge Questionnaire Score - 11/21/16 1154      Knowledge Questionnaire Score   Pre Score 26/28      Core Components/Risk Factors/Patient Goals at Admission:     Personal Goals and Risk Factors at Admission - 11/21/16 1146      Core Components/Risk Factors/Patient Goals on Admission    Weight Management Yes;Obesity;Weight Loss   Intervention Weight Management: Develop a combined nutrition and exercise program designed to reach desired caloric intake, while maintaining appropriate intake of nutrient and fiber, sodium and fats, and appropriate energy expenditure required for the weight goal.;Weight Management: Provide education and appropriate resources to help participant work on and attain dietary goals.;Weight Management/Obesity: Establish reasonable  short term and long term weight goals.  Amen reports that he has lost about 15 lbs in the past 2 months and hopes to lose more. Damarrion reported that he started running in his 50s and ran in [place] in 3 hours and 69mnutes.   Admit Weight 258 lb 12.8 oz (117.4 kg)   Goal Weight: Short Term 250 lb (113.4 kg)   Goal Weight: Long Term 200 lb (90.7 kg)   Expected Outcomes Short Term: Continue to assess and modify interventions until short term weight is achieved;Long Term: Adherence to nutrition and physical activity/exercise program aimed toward attainment of established weight goal;Weight Loss: Understanding of general recommendations for a balanced deficit meal plan, which promotes 1-2 lb weight loss per week and includes a negative energy balance of [phone] kcal/d;Understanding of distribution of calorie intake throughout the day with the consumption of 4-5 meals/snacks;Understanding recommendations for meals to include 15-35% energy as protein, 25-35% energy  from fat, 35-60% energy from carbohydrates, less than 2057mof dietary cholesterol, 20-35 gm of total fiber daily   Heart Failure Yes   Intervention Provide a combined exercise and nutrition program that is supplemented with education, support and counseling about heart failure. Directed toward relieving symptoms such as shortness of breath, decreased exercise tolerance, and extremity edema.   Expected Outcomes Improve functional capacity of life;Short term: Attendance in program 2-3 days a week with increased exercise capacity. Reported lower sodium intake. Reported increased fruit and vegetable intake. Reports medication compliance.;Short term: Daily weights obtained and reported for increase. Utilizing diuretic protocols set by physician.;Long term: Adoption of self-care skills and reduction of barriers for early signs and symptoms recognition and intervention leading to self-care maintenance.   Hypertension Yes   Intervention Provide education on lifestyle modifcations including regular physical activity/exercise, weight management, moderate sodium restriction and increased consumption of fresh fruit, vegetables, and low fat dairy, alcohol moderation, and smoking cessation.;Monitor prescription use compliance.   Expected Outcomes Long Term: Maintenance of blood pressure at goal levels.;Short Term: Continued assessment and intervention until BP is < 140/9056mG in hypertensive participants. < 130/38m67m in hypertensive participants with diabetes, heart failure or chronic kidney disease.   Lipids Yes   Intervention Provide education and support for participant on nutrition & aerobic/resistive exercise along with prescribed medications to achieve LDL <70mg38mL >40mg.59mxpected Outcomes Short Term: Participant states understanding of desired cholesterol values and is compliant with medications prescribed. Participant is following exercise prescription and nutrition guidelines.;Long Term: Cholesterol  controlled with medications as prescribed, with individualized exercise RX and with personalized nutrition plan. Value goals: LDL < 70mg, 41m> 40 mg.      Core Components/Risk Factors/Patient Goals Review:      Goals and Risk Factor Review    Row Name 12/23/16 1223             Core Components/Risk Factors/Patient Goals Review   Personal Goals Review Weight Management/Obesity;Heart Failure;Hypertension;Lipids       Review I left a vm on his cell phone that we hope to see him soon back in Cardiac Rehab. I said I saw the note from his doctors' office and I believe it will help his breathing.  He has not lost much weight. Hopefully he will be more consistent in his exercising in Cardiac REhab       Expected Outcomes Completion of Cardiac Rehab to help with a heart healthy lifestyle.  Core Components/Risk Factors/Patient Goals at Discharge (Final Review):      Goals and Risk Factor Review - 12/23/16 1223      Core Components/Risk Factors/Patient Goals Review   Personal Goals Review Weight Management/Obesity;Heart Failure;Hypertension;Lipids   Review I left a vm on his cell phone that we hope to see him soon back in Cardiac Rehab. I said I saw the note from his doctors' office and I believe it will help his breathing.  He has not lost much weight. Hopefully he will be more consistent in his exercising in Cardiac REhab   Expected Outcomes Completion of Cardiac Rehab to help with a heart healthy lifestyle.      ITP Comments:     ITP Comments    Row Name 11/21/16 1234 11/30/16 3794 12/23/16 0952 12/23/16 1222 12/28/16 1132   ITP Comments Medical Review completed.  Diagnosis documentation can be found in [facility] encounter 11/11/16 30 day review. Continue with ITP unless directed changes per Medical Director review   New to program Mr Asfaw has not attended since 12/05/16.  Staff left a phone message yesterday to check on stauts of return.   I left a vm on his cell phone that we hope to  see him soon back in Cardiac Rehab. I said I saw the note from his doctors' office and I believe it will help his breathing. He had called his MD office to say he was getting short of breath cutting the grass or working outside.  30 day review. Continue with ITP unless directed changes per Medical Director review.        Comments:

## 2016-12-29 ENCOUNTER — Encounter: Payer: Self-pay | Admitting: Internal Medicine

## 2016-12-29 ENCOUNTER — Ambulatory Visit (INDEPENDENT_AMBULATORY_CARE_PROVIDER_SITE_OTHER): Payer: PPO | Admitting: Internal Medicine

## 2016-12-29 DIAGNOSIS — R0600 Dyspnea, unspecified: Secondary | ICD-10-CM | POA: Diagnosis not present

## 2016-12-29 MED ORDER — IPRATROPIUM-ALBUTEROL 0.5-2.5 (3) MG/3ML IN SOLN
3.0000 mL | Freq: Once | RESPIRATORY_TRACT | Status: AC
Start: 2016-12-29 — End: 2016-12-29
  Administered 2016-12-29: 3 mL via RESPIRATORY_TRACT

## 2016-12-29 MED ORDER — IPRATROPIUM-ALBUTEROL 0.5-2.5 (3) MG/3ML IN SOLN: 3.0000 mL | Freq: Four times a day (QID) | RESPIRATORY_TRACT | 5 refills | Status: DC | PRN

## 2016-12-29 NOTE — Patient Instructions (Signed)
Continue CPAP as prescribed dounebs as needed every 6 hrs

## 2016-12-29 NOTE — Progress Notes (Signed)
  [facility] Consultation      MRN# [id number] JOSEPHMICHAEL LISENBEE Jul 10, 1935   CC: Chief Complaint  Patient presents with  . Sleep Apnea    Patient here for follow up cpap. Pt wears machine 7-8 hrs nightly. He is not having trouble with with equipment.  . Shortness of Breath    with exhertion      Brief History: 81 yo M former smoker, history of CAD/CABG/hypertension/allergies on immunotherapy/OSA on CPAP/prior PE, initially seen in consultation for dyspnea, PFT with mixed restriction and obstruction, currently following with pulmonary for OSA optimization.   PREVIOUS OV He was diagnosed with OSA in 2010, AHI= 52. Compliance report shows AHI 0.7, 100% compliance CPAP 12 cm h20   Has increased SOB and lower ext swelling S/p cardiac stents placed Patient has increased WOB and SOB Intermittent wheezing Quit tobacco abuse 40 years ago 6MWT was WNL   He is accompanied today by his wife.  No signs of infection at this time     Review of Systems  Constitutional: Negative for malaise/fatigue.  Eyes: Negative for blurred vision.  Respiratory: Positive for shortness of breath and wheezing. Negative for cough, hemoptysis and sputum production.   Cardiovascular: Negative for chest pain.  Genitourinary: Negative for dysuria.  Musculoskeletal: Negative for myalgias.  Neurological: Negative for dizziness, weakness and headaches.  Endo/Heme/Allergies: Does not bruise/bleed easily.  Psychiatric/Behavioral: Negative for depression.      Allergies:  Pravastatin and Prednisone  Physical Examination:  BP 130/82 (BP Location: Left Arm, Cuff Size: Normal)   Pulse 60   Resp 16   Ht 5' 6.8" (1.697 m)   Wt 260 lb (117.9 kg)   SpO2 95%   BMI 40.97 kg/m   General Appearance: No distress  HEENT: PERRLA, no ptosis, no other lesions noticed Pulmonary:normal breath sounds., diaphragmatic excursion normal.No wheezing, +rales/crackles Cardiovascular:  Normal S1,S2.   No m/r/g.     Abdomen:Exam: Benign, Soft, non-tender, No masses  Skin:   warm, no rashes, no ecchymosis  Extremities: +edema     Assessment and Plan:  81 year old male past medical history of obesity, obstructive sleep apnea, seen in follow-up visit for OSA optimization.  Now with chronic SOB likely from CHF and his morbid obesity and deconditioned state  SOB Check PFT-assess for reactiv airways disease -follow up cardiology Will give douneb in office today and assess response-he states that he felt a little better Will prescribe DOUNEBs every 6 hrs as needed  OSA (obstructive sleep apnea) Patient with prior sleep study with diagnosis of severe sleep apnea. Doing well today with AHI=1.3 on 12cm H2O of CPAP, with 100% compliance.  Plan: -CPAP 12cm H2O with full face mask  Obesity -recommend significant weight loss -recommend changing diet  Deconditioned state -Recommend increased daily activity and exercise  Patient/Family are satisfied with Plan of action and management. All questions answered Follow up in 3-6 months  Marlo Goodrich Patricia Pesa, M.D.  [facility]  Medical Director [place] Director [facility] Cardio-Pulmonary Department

## 2016-12-30 ENCOUNTER — Telehealth: Payer: Self-pay | Admitting: Internal Medicine

## 2016-12-30 NOTE — Telephone Encounter (Signed)
See message below as an FYI for you.

## 2016-12-30 NOTE — Telephone Encounter (Signed)
Patient decided not fill rx for nebulizers and wants [place] to be aware.

## 2017-01-02 ENCOUNTER — Ambulatory Visit: Admit: 2017-01-02 | Payer: PPO | Admitting: Gastroenterology

## 2017-01-02 SURGERY — ESOPHAGOGASTRODUODENOSCOPY (EGD) WITH PROPOFOL
Anesthesia: General

## 2017-01-02 NOTE — Telephone Encounter (Signed)
ok 

## 2017-01-03 ENCOUNTER — Telehealth: Payer: Self-pay | Admitting: Cardiovascular Disease

## 2017-01-03 ENCOUNTER — Telehealth: Payer: Self-pay | Admitting: Neurology

## 2017-01-03 ENCOUNTER — Ambulatory Visit: Payer: PPO | Admitting: Neurology

## 2017-01-03 DIAGNOSIS — I214 Non-ST elevation (NSTEMI) myocardial infarction: Secondary | ICD-10-CM

## 2017-01-03 DIAGNOSIS — Z955 Presence of coronary angioplasty implant and graft: Secondary | ICD-10-CM

## 2017-01-03 NOTE — Telephone Encounter (Signed)
Pt has 9/19 OV w/Dr. Lacinda Axon, PCP, for depression.

## 2017-01-03 NOTE — Progress Notes (Unsigned)
Discharge Progress Report  Patient Details  Name: Andre [name] MRN: [id number] Date of Birth: 05/18/1935 Referring Provider:     Cardiac Rehab from 11/21/2016 in [facility]  Referring Provider  [name] MD       Number of Visits: 4  Reason for Discharge:  Early Exit:  Personal  Smoking History:  History  Smoking Status  . Former Smoker  . Packs/day: 1.00  . Years: 40.00  . Types: Cigarettes, Pipe, Cigars  . Quit date: 04/18/1972  Smokeless Tobacco  . Former Systems developer  . Types: Chew  . Quit date: 04/18/1972    Diagnosis:  No diagnosis found.  ADL UCSD:   Initial Exercise Prescription:     Initial Exercise Prescription - 11/21/16 1200      Date of Initial Exercise RX and Referring Provider   Date 11/21/16   Referring Provider [name] MD     Treadmill   MPH 1.3   Grade 0   Minutes 15   METs 2     Recumbant Bike   Level 1   RPM 50   Watts 5   Minutes 15   METs 2.14     T5 Nustep   Level 1   SPM 80   Minutes 15   METs 2     Prescription Details   Frequency (times per week) 3   Duration Progress to 45 minutes of aerobic exercise without signs/symptoms of physical distress     Intensity   THRR 40-80% of Max Heartrate 91-124   Ratings of Perceived Exertion 11-13   Perceived Dyspnea 0-4     Progression   Progression Continue to progress workloads to maintain intensity without signs/symptoms of physical distress.     Resistance Training   Training Prescription Yes   Weight 3 lbs   Reps 10-15      Discharge Exercise Prescription (Final Exercise Prescription Changes):     Exercise Prescription Changes - 12/09/16 1300      Response to Exercise   Blood Pressure (Admit) 140/64   Blood Pressure (Exercise) 154/78   Blood Pressure (Exit) 134/82   Heart Rate (Admit) 61 bpm   Heart Rate (Exercise) 96 bpm   Heart Rate (Exit) 72 bpm   Rating of Perceived Exertion (Exercise) 13   Symptoms none   Duration Progress to  45 minutes of aerobic exercise without signs/symptoms of physical distress   Intensity THRR unchanged     Progression   Progression Continue to progress workloads to maintain intensity without signs/symptoms of physical distress.   Average METs 2.05     Resistance Training   Training Prescription Yes   Weight 3 lbs.   Reps 10-15     Interval Training   Interval Training No     Treadmill   MPH 1.3   Grade 0   Minutes 15   METs 2     Recumbant Bike   Level 1   RPM 50   Watts 36   Minutes 15   METs 2.92      Functional Capacity:     6 Minute Walk    Row Name 11/21/16 1220         6 Minute Walk   Phase Initial     Distance 58 feet     Walk Time 6 minutes     # of Rest Breaks 0     MPH 2.58     METS 2.02     RPE  16     Perceived Dyspnea  2     VO2 Peak 7.05     Symptoms Yes (comment)     Comments SOB, leg fatigue, legs feel wobbly, dizzy on turns      Resting HR 58 bpm     Resting BP 124/64     Max Ex. HR 109 bpm     Max Ex. BP 154/64     2 Minute Post BP 146/74  recheck 128/70        Psychological, QOL, Others - Outcomes: PHQ 2/9: Depression screen [facility] 2/9 11/21/2016 09/27/2016  Decreased Interest 3 3  Down, Depressed, Hopeless 2 3  PHQ - 2 Score 5 6  Altered sleeping 0 3  Tired, decreased energy 2 3  Change in appetite 1 3  Feeling bad or failure about yourself  3 2  Trouble concentrating 0 1  Moving slowly or fidgety/restless 0 0  Suicidal thoughts 0 1  PHQ-9 Score 11 19  Difficult doing work/chores Not difficult at all -    Quality of Life:     Quality of Life - 11/21/16 1145      Quality of Life Scores   Health/Function Pre 26.8 %   Socioeconomic Pre 28.29 %   Psych/Spiritual Pre 18.86 %   Family Pre 30 %   GLOBAL Pre 25.94 %      Personal Goals: Goals established at orientation with interventions provided to work toward goal.     Personal Goals and Risk Factors at Admission - 11/21/16 1146      Core Components/Risk  Factors/Patient Goals on Admission    Weight Management Yes;Obesity;Weight Loss   Intervention Weight Management: Develop a combined nutrition and exercise program designed to reach desired caloric intake, while maintaining appropriate intake of nutrient and fiber, sodium and fats, and appropriate energy expenditure required for the weight goal.;Weight Management: Provide education and appropriate resources to help participant work on and attain dietary goals.;Weight Management/Obesity: Establish reasonable short term and long term weight goals.  Andre [name] reports that he has lost about 15 lbs in the past 2 months and hopes to lose more. Andre [name] reported that he started running in his 31s and ran in [place] in 3 hours and 54minutes.   Admit Weight 258 lb 12.8 oz (117.4 kg)   Goal Weight: Short Term 250 lb (113.4 kg)   Goal Weight: Long Term 200 lb (90.7 kg)   Expected Outcomes Short Term: Continue to assess and modify interventions until short term weight is achieved;Long Term: Adherence to nutrition and physical activity/exercise program aimed toward attainment of established weight goal;Weight Loss: Understanding of general recommendations for a balanced deficit meal plan, which promotes 1-2 lb weight loss per week and includes a negative energy balance of [phone] kcal/d;Understanding of distribution of calorie intake throughout the day with the consumption of 4-5 meals/snacks;Understanding recommendations for meals to include 15-35% energy as protein, 25-35% energy from fat, 35-60% energy from carbohydrates, less than 200mg  of dietary cholesterol, 20-35 gm of total fiber daily   Heart Failure Yes   Intervention Provide a combined exercise and nutrition program that is supplemented with education, support and counseling about heart failure. Directed toward relieving symptoms such as shortness of breath, decreased exercise tolerance, and extremity edema.   Expected  Outcomes Improve functional capacity of life;Short term: Attendance in program 2-3 days a week with increased exercise capacity. Reported lower sodium intake. Reported increased fruit and vegetable intake. Reports medication  compliance.;Short term: Daily weights obtained and reported for increase. Utilizing diuretic protocols set by physician.;Long term: Adoption of self-care skills and reduction of barriers for early signs and symptoms recognition and intervention leading to self-care maintenance.   Hypertension Yes   Intervention Provide education on lifestyle modifcations including regular physical activity/exercise, weight management, moderate sodium restriction and increased consumption of fresh fruit, vegetables, and low fat dairy, alcohol moderation, and smoking cessation.;Monitor prescription use compliance.   Expected Outcomes Long Term: Maintenance of blood pressure at goal levels.;Short Term: Continued assessment and intervention until BP is < 140/28mm HG in hypertensive participants. < 130/72mm HG in hypertensive participants with diabetes, heart failure or chronic kidney disease.   Lipids Yes   Intervention Provide education and support for participant on nutrition & aerobic/resistive exercise along with prescribed medications to achieve LDL 70mg , HDL >40mg .   Expected Outcomes Short Term: Participant states understanding of desired cholesterol values and is compliant with medications prescribed. Participant is following exercise prescription and nutrition guidelines.;Long Term: Cholesterol controlled with medications as prescribed, with individualized exercise RX and with personalized nutrition plan. Value goals: LDL < 70mg , HDL > 40 mg.       Personal Goals Discharge:     Goals and Risk Factor Review    Row Name 12/23/16 1223             Core Components/Risk Factors/Patient Goals Review   Personal Goals Review Weight Management/Obesity;Heart Failure;Hypertension;Lipids        Review I left a vm on his cell phone that we hope to see him soon back in Cardiac Rehab. I said I saw the note from his doctors' office and I believe it will help his breathing.  He has not lost much weight. Hopefully he will be more consistent in his exercising in Cardiac REhab       Expected Outcomes Completion of Cardiac Rehab to help with a heart healthy lifestyle.          Exercise Goals and Review:     Exercise Goals    Row Name 11/21/16 1229             Exercise Goals   Increase Physical Activity Yes       Intervention Provide advice, education, support and counseling about physical activity/exercise needs.;Develop an individualized exercise prescription for aerobic and resistive training based on initial evaluation findings, risk stratification, comorbidities and participant's personal goals.       Expected Outcomes Achievement of increased cardiorespiratory fitness and enhanced flexibility, muscular endurance and strength shown through measurements of functional capacity and personal statement of participant.       Increase Strength and Stamina Yes       Intervention Provide advice, education, support and counseling about physical activity/exercise needs.;Develop an individualized exercise prescription for aerobic and resistive training based on initial evaluation findings, risk stratification, comorbidities and participant's personal goals.       Expected Outcomes Achievement of increased cardiorespiratory fitness and enhanced flexibility, muscular endurance and strength shown through measurements of functional capacity and personal statement of participant.          Nutrition & Weight - Outcomes:     Pre Biometrics - 11/21/16 1229      Pre Biometrics   Height 5' 6.8" (1.697 m)   Weight 258 lb 12.8 oz (117.4 kg)   Waist Circumference 46 inches   Hip Circumference 46 inches   Waist to Hip Ratio 1 %   BMI (Calculated) 40.9   Single Leg  Stand 7.53 seconds        Nutrition:     Nutrition Therapy & Goals - 12/23/16 1224      Nutrition Therapy   RD appointment defered Yes      Nutrition Discharge:     Nutrition Assessments - 11/21/16 1158      MEDFICTS Scores   Pre Score 26      Education Questionnaire Score:     Knowledge Questionnaire Score - 11/21/16 1154      Knowledge Questionnaire Score   Pre Score 26/28      Goals reviewed with patient; copy given to patient.

## 2017-01-03 NOTE — Progress Notes (Signed)
Cardiac Individual Treatment Plan  Patient Details  Name: Andre [name] MRN: [id number] Date of Birth: 1935-11-25 Referring Provider:     Cardiac Rehab from 11/21/2016 in [facility] Cardiac and Pulmonary Rehab  Referring Provider  [name] MD      Initial Encounter Date:    Cardiac Rehab from 11/21/2016 in [facility] Cardiac and Pulmonary Rehab  Date  11/21/16  Referring Provider  [name] MD      Visit Diagnosis: Status post coronary artery stent placement  NSTEMI (non-ST elevated myocardial infarction) [facility])  Patient's Home Medications on Admission:  Current Outpatient Prescriptions:  .  acetaminophen (TYLENOL) 500 MG tablet, Take 500 mg by mouth every 6 (six) hours as needed., Disp: , Rfl:  .  amiodarone (PACERONE) 200 MG tablet, Take 200 mg by mouth daily. , Disp: , Rfl:  .  apixaban (ELIQUIS) 5 MG TABS tablet, Take 5 mg by mouth 2 (two) times daily. Reported on 07/31/2015, Disp: , Rfl:  .  aspirin EC 81 MG tablet, Take 81 mg by mouth daily., Disp: , Rfl:  .  atorvastatin (LIPITOR) 40 MG tablet, Take 1 tablet (40 mg total) by mouth daily., Disp: 90 tablet, Rfl: 3 .  busPIRone (BUSPAR) 7.5 MG tablet, Take 7.5 mg by mouth 2 (two) times daily., Disp: , Rfl:  .  carbidopa-levodopa (PARCOPA) 25-250 MG per disintegrating tablet, Take 2 tablets by mouth 3 (three) times daily. Reported on 07/14/2015, Disp: , Rfl:  .  carvedilol (COREG) 6.25 MG tablet, TAKE ONE TABLET BY MOUTH TWICE DAILY WITH A MEAL, Disp: 60 tablet, Rfl: 3 .  clopidogrel (PLAVIX) 75 MG tablet, TAKE ONE TABLET BY MOUTH EVERY DAY WITH BREAKFAST, Disp: 30 tablet, Rfl: 3 .  cyclobenzaprine (FLEXERIL) 5 MG tablet, Take 5 mg by mouth daily., Disp: , Rfl:  .  EPINEPHrine 0.3 mg/0.3 mL IJ SOAJ injection, Inject 0.3 mg as directed once as needed (allergic reaction). Reported on 10/27/2015, Disp: , Rfl:  .  esomeprazole (NEXIUM) 20 MG capsule, Take 40 mg by mouth daily at 12 noon., Disp: , Rfl:  .  fluticasone (FLONASE) 50 MCG/ACT  nasal spray, TAKE 2 PUFFS IN EACH NOSTRIL EVERY DAY, Disp: 16 g, Rfl: 3 .  furosemide (LASIX) 40 MG tablet, Take 1/2 tablet (20 mg) by mouth once every other day, Disp: , Rfl:  .  Iodoquinol-HC-Aloe Polysacch (ALCORTIN A) 1-2-1 % GEL, Apply 1 application topically daily., Disp: , Rfl: 3 .  ipratropium-albuterol (DUONEB) 0.5-2.5 (3) MG/3ML SOLN, Take 3 mLs by nebulization every 6 (six) hours as needed. Dx: J45.909, Disp: 360 mL, Rfl: 5 .  isosorbide mononitrate (IMDUR) 30 MG 24 hr tablet, TAKE ONE TABLET BY MOUTH EVERY DAY, Disp: 30 tablet, Rfl: 3 .  levothyroxine (SYNTHROID, LEVOTHROID) 50 MCG tablet, 50 mcg daily before breakfast. , Disp: , Rfl:  .  losartan (COZAAR) 25 MG tablet, TAKE ONE TABLET BY MOUTH EVERY DAY, Disp: 30 tablet, Rfl: 3 .  mirabegron ER (MYRBETRIQ) 50 MG TB24 tablet, Take 50 mg by mouth daily., Disp: , Rfl:  .  VENTOLIN HFA 108 (90 Base) MCG/ACT inhaler, TAKE 2 PUFFS EVERY 8 HOURS AS NEEDED FORWHEEZING, Disp: 18 g, Rfl: 3  Past Medical History: Past Medical History:  Diagnosis Date  . Cervical spondylosis 10/01/2013  . Chronic diastolic CHF (congestive heart failure) ([place])    a. 07/2016 Echo: >55%.  . Coronary artery disease    a. 1998 s/p mini-cabg @ [facility] - pt reports one vessel bypass to LAD;  b. 07/2016 St Echo:  Inadequate HR (max 97) w/ hypertensive response (220/96). Ex time only 2:54 - stopped due to dyspnea and leg pain;  c.  08/2016 MV: EF 67%, no ischemia, low risk.  . Depression   . GERD (gastroesophageal reflux disease)   . Hyperlipidemia   . Hypertension   . PAF (paroxysmal atrial fibrillation) ([facility])    a. s/p DCCV-->maintaining sinus on amiodarone;  b. CHA2DS2VASc = 5-->eliquis.  . Parkinson's disease ([place])    tremors  . Pulmonary embolism ([place]) 2011  . 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 Sleep apnea    wears CPAP    Tobacco Use: History  Smoking Status  . Former Smoker  . Packs/day: 1.00  . Years: 40.00  . Types: Cigarettes, Pipe, Cigars  .  Quit date: 04/18/1972  Smokeless Tobacco  . Former Systems developer  . Types: Chew  . Quit date: 04/18/1972    Labs: Recent Review Flowsheet Data    Labs for ITP Cardiac and Pulmonary Rehab Latest Ref Rng & Units 10/27/2016 10/29/2016   Cholestrol 0 - 200 mg/dL 173 183   LDLCALC 0 - 99 mg/dL 95 92   HDL >40 mg/dL 50.90 45   Trlycerides <150 mg/dL 137.0 229(H)       Exercise Target Goals:    Exercise Program Goal: Individual exercise prescription set with THRR, safety & activity barriers. Participant demonstrates ability to understand and report RPE using BORG scale, to self-measure pulse accurately, and to acknowledge the importance of the exercise prescription.  Exercise Prescription Goal: Starting with aerobic activity 30 plus minutes a day, 3 days per week for initial exercise prescription. Provide home exercise prescription and guidelines that participant acknowledges understanding prior to discharge.  Activity Barriers & Risk Stratification:     Activity Barriers & Cardiac Risk Stratification - 11/21/16 1158      Activity Barriers & Cardiac Risk Stratification   Activity Barriers Neck/Spine Problems;Shortness of Breath;Muscular Weakness;Deconditioning   Cardiac Risk Stratification High      6 Minute Walk:     6 Minute Walk    Row Name 11/21/16 1220         6 Minute Walk   Phase Initial     Distance 58 feet     Walk Time 6 minutes     # of Rest Breaks 0     MPH 2.58     METS 2.02     RPE 16     Perceived Dyspnea  2     VO2 Peak 7.05     Symptoms Yes (comment)     Comments SOB, leg fatigue, legs feel wobbly, dizzy on turns      Resting HR 58 bpm     Resting BP 124/64     Max Ex. HR 109 bpm     Max Ex. BP 154/64     2 Minute Post BP 146/74  recheck 128/70        Oxygen Initial Assessment:     Oxygen Initial Assessment - 11/21/16 1157      Initial 6 min Walk   Oxygen Used None      Oxygen Re-Evaluation:   Oxygen Discharge (Final Oxygen  Re-Evaluation):   Initial Exercise Prescription:     Initial Exercise Prescription - 11/21/16 1200      Date of Initial Exercise RX and Referring Provider   Date 11/21/16   Referring Provider [name] MD     Treadmill   MPH 1.3   Grade 0  Minutes 15   METs 2     Recumbant Bike   Level 1   RPM 50   Watts 5   Minutes 15   METs 2.14     T5 Nustep   Level 1   SPM 80   Minutes 15   METs 2     Prescription Details   Frequency (times per week) 3   Duration Progress to 45 minutes of aerobic exercise without signs/symptoms of physical distress     Intensity   THRR 40-80% of Max Heartrate 91-124   Ratings of Perceived Exertion 11-13   Perceived Dyspnea 0-4     Progression   Progression Continue to progress workloads to maintain intensity without signs/symptoms of physical distress.     Resistance Training   Training Prescription Yes   Weight 3 lbs   Reps 10-15      Perform Capillary Blood Glucose checks as needed.  Exercise Prescription Changes:     Exercise Prescription Changes    Row Name 11/21/16 1200 12/09/16 1300           Response to Exercise   Blood Pressure (Admit) 124/64 140/64      Blood Pressure (Exercise) 154/64 154/78      Blood Pressure (Exit) 146/74  recheck 128/70 134/82      Heart Rate (Admit) 58 bpm 61 bpm      Heart Rate (Exercise) 109 bpm 96 bpm      Heart Rate (Exit) 76 bpm 72 bpm      Oxygen Saturation (Admit) 94 %  -      Oxygen Saturation (Exercise) 92 %  -      Rating of Perceived Exertion (Exercise) 16 13      Perceived Dyspnea (Exercise) 2  -      Symptoms SOB, leg fatigue, dizzy on turns none      Comments walk test results  -      Duration  - Progress to 45 minutes of aerobic exercise without signs/symptoms of physical distress      Intensity  - THRR unchanged        Progression   Progression  - Continue to progress workloads to maintain intensity without signs/symptoms of physical distress.      Average METs   - 2.05        Resistance Training   Training Prescription  - Yes      Weight  - 3 lbs.      Reps  - 10-15        Interval Training   Interval Training  - No        Treadmill   MPH  - 1.3      Grade  - 0      Minutes  - 15      METs  - 2        Recumbant Bike   Level  - 1      RPM  - 50      Watts  - 36      Minutes  - 15      METs  - 2.92         Exercise Comments:     Exercise Comments    Row Name 11/23/16 1811           Exercise Comments First full day of exercise!  Patient was oriented to gym and equipment including functions, settings, policies, and procedures.  Patient's individual exercise prescription and treatment plan  were reviewed.  All starting workloads were established based on the results of the 6 minute walk test done at initial orientation visit.  The plan for exercise progression was also introduced and progression will be customized based on patient's performance and goals.          Exercise Goals and Review:     Exercise Goals    Row Name 11/21/16 1229             Exercise Goals   Increase Physical Activity Yes       Intervention Provide advice, education, support and counseling about physical activity/exercise needs.;Develop an individualized exercise prescription for aerobic and resistive training based on initial evaluation findings, risk stratification, comorbidities and participant's personal goals.       Expected Outcomes Achievement of increased cardiorespiratory fitness and enhanced flexibility, muscular endurance and strength shown through measurements of functional capacity and personal statement of participant.       Increase Strength and Stamina Yes       Intervention Provide advice, education, support and counseling about physical activity/exercise needs.;Develop an individualized exercise prescription for aerobic and resistive training based on initial evaluation findings, risk stratification, comorbidities and participant's  personal goals.       Expected Outcomes Achievement of increased cardiorespiratory fitness and enhanced flexibility, muscular endurance and strength shown through measurements of functional capacity and personal statement of participant.          Exercise Goals Re-Evaluation :     Exercise Goals Re-Evaluation    Row Name 12/09/16 1308 12/23/16 0953           Exercise Goal Re-Evaluation   Exercise Goals Review Increase Physical Activity;Increase Strenth and Stamina  -      Comments Peyten is tolerating exercise well. Out since last review.      Expected Outcomes Short - Katelyn will attend class  regularly.  Long - Amy will increase overall fitness level.  -         Discharge Exercise Prescription (Final Exercise Prescription Changes):     Exercise Prescription Changes - 12/09/16 1300      Response to Exercise   Blood Pressure (Admit) 140/64   Blood Pressure (Exercise) 154/78   Blood Pressure (Exit) 134/82   Heart Rate (Admit) 61 bpm   Heart Rate (Exercise) 96 bpm   Heart Rate (Exit) 72 bpm   Rating of Perceived Exertion (Exercise) 13   Symptoms none   Duration Progress to 45 minutes of aerobic exercise without signs/symptoms of physical distress   Intensity THRR unchanged     Progression   Progression Continue to progress workloads to maintain intensity without signs/symptoms of physical distress.   Average METs 2.05     Resistance Training   Training Prescription Yes   Weight 3 lbs.   Reps 10-15     Interval Training   Interval Training No     Treadmill   MPH 1.3   Grade 0   Minutes 15   METs 2     Recumbant Bike   Level 1   RPM 50   Watts 36   Minutes 15   METs 2.92      Nutrition:  Target Goals: Understanding of nutrition guidelines, daily intake of sodium <1527m, cholesterol <2066m calories 30% from fat and 7% or less from saturated fats, daily to have 5 or more servings of fruits and vegetables.  Biometrics:     Pre Biometrics - 11/21/16  1229  Pre Biometrics   Height 5' 6.8" (1.697 m)   Weight 258 lb 12.8 oz (117.4 kg)   Waist Circumference 46 inches   Hip Circumference 46 inches   Waist to Hip Ratio 1 %   BMI (Calculated) 40.9   Single Leg Stand 7.53 seconds       Nutrition Therapy Plan and Nutrition Goals:     Nutrition Therapy & Goals - 12/23/16 1224      Nutrition Therapy   RD appointment defered Yes      Nutrition Discharge: Rate Your Plate Scores:     Nutrition Assessments - 11/21/16 1158      MEDFICTS Scores   Pre Score 26      Nutrition Goals Re-Evaluation:   Nutrition Goals Discharge (Final Nutrition Goals Re-Evaluation):   Psychosocial: Target Goals: Acknowledge presence or absence of significant depression and/or stress, maximize coping skills, provide positive support system. Participant is able to verbalize types and ability to use techniques and skills needed for reducing stress and depression.   Initial Review & Psychosocial Screening:     Initial Psych Review & Screening - 11/21/16 1148      Family Dynamics   Comments Kidus reports that he has lost about 15 lbs in the past 2 months and hopes to lose more. Antaeus reported that he started running in his 35s and ran in [place] in 3 hours and 33mnutes. FJarquissaid he got frustrated since he had heart problems after he felt like he did all that exericisng including riding his bicycle 100 miles in a day.       Quality of Life Scores:      Quality of Life - 11/21/16 1145      Quality of Life Scores   Health/Function Pre 26.8 %   Socioeconomic Pre 28.29 %   Psych/Spiritual Pre 18.86 %   Family Pre 30 %   GLOBAL Pre 25.94 %      PHQ-9: Recent Review Flowsheet Data    Depression screen [facility] 11/21/2016 09/27/2016   Decreased Interest 3 3   Down, Depressed, Hopeless 2 3   PHQ - 2 Score 5 6   Altered sleeping 0 3   Tired, decreased energy 2 3   Change in appetite 1 3   Feeling  bad or failure about yourself  3 2   Trouble concentrating 0 1   Moving slowly or fidgety/restless 0 0   Suicidal thoughts 0 1   PHQ-9 Score 11 19   Difficult doing work/chores Not difficult at all -     Interpretation of Total Score  Total Score Depression Severity:  1-4 = Minimal depression, 5-9 = Mild depression, 10-14 = Moderate depression, 15-19 = Moderately severe depression, 20-27 = Severe depression   Psychosocial Evaluation and Intervention:     Psychosocial Evaluation - 11/30/16 1651      Psychosocial Evaluation & Interventions   Interventions Encouraged to exercise with the program and follow exercise prescription;Stress management education;Relaxation education   Comments Counselor met with Mr. DWolterson 8/8 for initial psychosocial evaluation.  The EPIC system was down that day and counselor was unable to put this information in at that time.  FAntawanis a 81year old that had a heart attack with an inserted stent on 7/13.  He has a strong support system with a spouse of 40 years and a step-daughter locally.  He has multiple health issues other than his heart; with Parkinson's diagnosed  approximately 20 years ago; sleep apnea and being overweight.  Wolfe states he sleeps well with the use of his CPAP machine; and he has a good appetite most of the time.  Amal denies a history of depression or anxiety; but has noticed some symptoms of depression since the heart attack  that he reports are improving somewhat.  His mood is generally positive and he reports other than his health, he has minimal stress in his life.  Saurav has goals to increase his stamina and strength and improve his mood while in this program.  Staff will follow with Embry throughout his time in Cardiac Rehab.     Expected Outcomes Ewin will benefit from consistent exercise to achieve his stated goals.  Meeting with the dietician will help him develop a plan for addressing his weight loss goals.  Ward will also benefit  from the educational and psychoeducational components of this program to help him manage and cope with his health concerns.     Continue Psychosocial Services  Follow up required by staff      Psychosocial Re-Evaluation:   Psychosocial Discharge (Final Psychosocial Re-Evaluation):   Vocational Rehabilitation: Provide vocational rehab assistance to qualifying candidates.   Vocational Rehab Evaluation & Intervention:     Vocational Rehab - 11/21/16 1154      Initial Vocational Rehab Evaluation & Intervention   Assessment shows need for Vocational Rehabilitation No      Education: Education Goals: Education classes will be provided on a variety of topics geared toward better understanding of heart health and risk factor modification. Participant will state understanding/return demonstration of topics presented as noted by education test scores.  Learning Barriers/Preferences:     Learning Barriers/Preferences - 11/21/16 1154      Learning Barriers/Preferences   Learning Barriers Hearing   Learning Preferences Individual Instruction      Education Topics: General Nutrition Guidelines/Fats and Fiber: -Group instruction provided by verbal, written material, models and posters to present the general guidelines for heart healthy nutrition. Gives an explanation and review of dietary fats and fiber.   Cardiac Rehab from 12/05/2016 in [facility] Cardiac and Pulmonary Rehab  Date  12/05/16  Educator  PI  Instruction Review Code (retired)  2- meets goals/outcomes      Controlling Sodium/Reading Food Labels: -Group verbal and written material supporting the discussion of sodium use in heart healthy nutrition. Review and explanation with models, verbal and written materials for utilization of the food label.   Exercise Physiology & Risk Factors: - Group verbal and written instruction with models to review the exercise physiology of the cardiovascular system and associated critical  values. Details cardiovascular disease risk factors and the goals associated with each risk factor.   Aerobic Exercise & Resistance Training: - Gives group verbal and written discussion on the health impact of inactivity. On the components of aerobic and resistive training programs and the benefits of this training and how to safely progress through these programs.   Flexibility, Balance, General Exercise Guidelines: - Provides group verbal and written instruction on the benefits of flexibility and balance training programs. Provides general exercise guidelines with specific guidelines to those with heart or lung disease. Demonstration and skill practice provided.   Stress Management: - Provides group verbal and written instruction about the health risks of elevated stress, cause of high stress, and healthy ways to reduce stress.   Depression: - Provides group verbal and written instruction on the correlation between heart/lung disease and depressed mood, treatment options,  and the stigmas associated with seeking treatment.   Anatomy & Physiology of the Heart: - Group verbal and written instruction and models provide basic cardiac anatomy and physiology, with the coronary electrical and arterial systems. Review of: AMI, Angina, Valve disease, Heart Failure, Cardiac Arrhythmia, Pacemakers, and the ICD.   Cardiac Procedures: - Group verbal and written instruction to review commonly prescribed medications for heart disease. Reviews the medication, class of the drug, and side effects. Includes the steps to properly store meds and maintain the prescription regimen. (beta blockers and nitrates)   Cardiac Rehab from 12/05/2016 in [facility] Cardiac and Pulmonary Rehab  Date  11/23/16 Marisue Humble 2]  Educator  SB [Part 2]  Instruction Review Code (retired)  2- meets goals/outcomes      Cardiac Medications I: - Group verbal and written instruction to review commonly prescribed medications for heart  disease. Reviews the medication, class of the drug, and side effects. Includes the steps to properly store meds and maintain the prescription regimen.   Cardiac Medications II: -Group verbal and written instruction to review commonly prescribed medications for heart disease. Reviews the medication, class of the drug, and side effects. (all other drug classes)    Go Sex-Intimacy & Heart Disease, Get SMART - Goal Setting: - Group verbal and written instruction through game format to discuss heart disease and the return to sexual intimacy. Provides group verbal and written material to discuss and apply goal setting through the application of the S.M.A.R.T. Method.   Cardiac Rehab from 12/05/2016 in [facility] Cardiac and Pulmonary Rehab  Date  11/23/16 Marisue Humble 2]  Educator  SB [Part 2]  Instruction Review Code (retired)  2- meets goals/outcomes      Other Matters of the Heart: - Provides group verbal, written materials and models to describe Heart Failure, Angina, Valve Disease, Peripheral Artery Disease, and Diabetes in the realm of heart disease. Includes description of the disease process and treatment options available to the cardiac patient.   Exercise & Equipment Safety: - Individual verbal instruction and demonstration of equipment use and safety with use of the equipment.   Cardiac Rehab from 12/05/2016 in [facility] Cardiac and Pulmonary Rehab  Date  11/21/16  Educator  C.Lester  Instruction Review Code (retired)  1- partially meets, needs review/practice      Infection Prevention: - Provides verbal and written material to individual with discussion of infection control including proper hand washing and proper equipment cleaning during exercise session.   Cardiac Rehab from 12/05/2016 in [facility] Cardiac and Pulmonary Rehab  Date  11/21/16  Educator  C. Grindstone  Instruction Review Code (retired)  1- partially meets, needs review/practice      Falls Prevention: - Provides verbal and  written material to individual with discussion of falls prevention and safety.   Cardiac Rehab from 12/05/2016 in [facility] Cardiac and Pulmonary Rehab  Date  11/21/16  Educator  C. Maynard  Instruction Review Code (retired)  1- partially meets, needs review/practice      Diabetes: - Individual verbal and written instruction to review signs/symptoms of diabetes, desired ranges of glucose level fasting, after meals and with exercise. Acknowledge that pre and post exercise glucose checks will be done for 3 sessions at entry of program.   Other: -Provides group and verbal instruction on various topics (see comments)    Knowledge Questionnaire Score:     Knowledge Questionnaire Score - 11/21/16 1154      Knowledge Questionnaire Score   Pre Score 26/28  Core Components/Risk Factors/Patient Goals at Admission:     Personal Goals and Risk Factors at Admission - 11/21/16 1146      Core Components/Risk Factors/Patient Goals on Admission    Weight Management Yes;Obesity;Weight Loss   Intervention Weight Management: Develop a combined nutrition and exercise program designed to reach desired caloric intake, while maintaining appropriate intake of nutrient and fiber, sodium and fats, and appropriate energy expenditure required for the weight goal.;Weight Management: Provide education and appropriate resources to help participant work on and attain dietary goals.;Weight Management/Obesity: Establish reasonable short term and long term weight goals.  Rodriguez reports that he has lost about 15 lbs in the past 2 months and hopes to lose more. Piercen reported that he started running in his 25s and ran in [place] in 3 hours and 56mnutes.   Admit Weight 258 lb 12.8 oz (117.4 kg)   Goal Weight: Short Term 250 lb (113.4 kg)   Goal Weight: Long Term 200 lb (90.7 kg)   Expected Outcomes Short Term: Continue to assess and modify interventions until short term  weight is achieved;Long Term: Adherence to nutrition and physical activity/exercise program aimed toward attainment of established weight goal;Weight Loss: Understanding of general recommendations for a balanced deficit meal plan, which promotes 1-2 lb weight loss per week and includes a negative energy balance of [phone] kcal/d;Understanding of distribution of calorie intake throughout the day with the consumption of 4-5 meals/snacks;Understanding recommendations for meals to include 15-35% energy as protein, 25-35% energy from fat, 35-60% energy from carbohydrates, less than 2058mof dietary cholesterol, 20-35 gm of total fiber daily   Heart Failure Yes   Intervention Provide a combined exercise and nutrition program that is supplemented with education, support and counseling about heart failure. Directed toward relieving symptoms such as shortness of breath, decreased exercise tolerance, and extremity edema.   Expected Outcomes Improve functional capacity of life;Short term: Attendance in program 2-3 days a week with increased exercise capacity. Reported lower sodium intake. Reported increased fruit and vegetable intake. Reports medication compliance.;Short term: Daily weights obtained and reported for increase. Utilizing diuretic protocols set by physician.;Long term: Adoption of self-care skills and reduction of barriers for early signs and symptoms recognition and intervention leading to self-care maintenance.   Hypertension Yes   Intervention Provide education on lifestyle modifcations including regular physical activity/exercise, weight management, moderate sodium restriction and increased consumption of fresh fruit, vegetables, and low fat dairy, alcohol moderation, and smoking cessation.;Monitor prescription use compliance.   Expected Outcomes Long Term: Maintenance of blood pressure at goal levels.;Short Term: Continued assessment and intervention until BP is < 140/9077mG in hypertensive  participants. < 130/1m52m in hypertensive participants with diabetes, heart failure or chronic kidney disease.   Lipids Yes   Intervention Provide education and support for participant on nutrition & aerobic/resistive exercise along with prescribed medications to achieve LDL <70mg16mL >40mg.75mxpected Outcomes Short Term: Participant states understanding of desired cholesterol values and is compliant with medications prescribed. Participant is following exercise prescription and nutrition guidelines.;Long Term: Cholesterol controlled with medications as prescribed, with individualized exercise RX and with personalized nutrition plan. Value goals: LDL < 70mg, 37m> 40 mg.      Core Components/Risk Factors/Patient Goals Review:      Goals and Risk Factor Review    Row Name 12/23/16 1223             Core Components/Risk Factors/Patient Goals Review   Personal Goals  Review Weight Management/Obesity;Heart Failure;Hypertension;Lipids       Review I left a vm on his cell phone that we hope to see him soon back in Cardiac Rehab. I said I saw the note from his doctors' office and I believe it will help his breathing.  He has not lost much weight. Hopefully he will be more consistent in his exercising in Cardiac REhab       Expected Outcomes Completion of Cardiac Rehab to help with a heart healthy lifestyle.          Core Components/Risk Factors/Patient Goals at Discharge (Final Review):      Goals and Risk Factor Review - 12/23/16 1223      Core Components/Risk Factors/Patient Goals Review   Personal Goals Review Weight Management/Obesity;Heart Failure;Hypertension;Lipids   Review I left a vm on his cell phone that we hope to see him soon back in Cardiac Rehab. I said I saw the note from his doctors' office and I believe it will help his breathing.  He has not lost much weight. Hopefully he will be more consistent in his exercising in Cardiac REhab   Expected Outcomes Completion of Cardiac  Rehab to help with a heart healthy lifestyle.      ITP Comments:     ITP Comments    Row Name 11/21/16 1234 11/30/16 3005 12/23/16 0952 12/23/16 1222 12/28/16 1132   ITP Comments Medical Review completed.  Diagnosis documentation can be found in [facility] encounter 11/11/16 30 day review. Continue with ITP unless directed changes per Medical Director review   New to program Mr Winer has not attended since 12/05/16.  Staff left a phone message yesterday to check on stauts of return.   I left a vm on his cell phone that we hope to see him soon back in Cardiac Rehab. I said I saw the note from his doctors' office and I believe it will help his breathing. He had called his MD office to say he was getting short of breath cutting the grass or working outside.  30 day review. Continue with ITP unless directed changes per Medical Director review.        Comments: Discharge ITP

## 2017-01-03 NOTE — Telephone Encounter (Signed)
This patient canceled same day appointment, the has been having chest pain, went to the cardiologist.

## 2017-01-03 NOTE — Progress Notes (Signed)
Discharge Progress Report  Patient Details  Name: Andre [name] MRN: [id number] Date of Birth: [birth year] Referring Provider:     Cardiac Rehab from 11/21/2016 in [facility]  Referring Provider  Andre Sacramento MD       Number of Visits: 4  Reason for Discharge:  Early Exit:  Personal  Smoking History:  History  Smoking Status  . Former Smoker  . Packs/day: 1.00  . Years: 40.00  . Types: Cigarettes, Pipe, Cigars  . Quit date: 04/18/1972  Smokeless Tobacco  . Former Systems developer  . Types: Chew  . Quit date: 04/18/1972    Diagnosis:  Status post coronary artery stent placement  NSTEMI (non-ST elevated myocardial infarction) ([place])  ADL [facility]:   Initial Exercise Prescription:     Initial Exercise Prescription - 11/21/16 1200      Date of Initial Exercise RX and Referring Provider   Date 11/21/16   Referring Provider Andre Sacramento MD     Treadmill   MPH 1.3   Grade 0   Minutes 15   METs 2     Recumbant Bike   Level 1   RPM 50   Watts 5   Minutes 15   METs 2.14     T5 Nustep   Level 1   SPM 80   Minutes 15   METs 2     Prescription Details   Frequency (times per week) 3   Duration Progress to 45 minutes of aerobic exercise without signs/symptoms of physical distress     Intensity   THRR 40-80% of Max Heartrate 91-124   Ratings of Perceived Exertion 11-13   Perceived Dyspnea 0-4     Progression   Progression Continue to progress workloads to maintain intensity without signs/symptoms of physical distress.     Resistance Training   Training Prescription Yes   Weight 3 lbs   Reps 10-15      Discharge Exercise Prescription (Final Exercise Prescription Changes):     Exercise Prescription Changes - 12/09/16 1300      Response to Exercise   Blood Pressure (Admit) 140/64   Blood Pressure (Exercise) 154/78   Blood Pressure (Exit) 134/82   Heart Rate (Admit) 61 bpm   Heart Rate (Exercise) 96 bpm   Heart Rate (Exit) 72 bpm    Rating of Perceived Exertion (Exercise) 13   Symptoms none   Duration Progress to 45 minutes of aerobic exercise without signs/symptoms of physical distress   Intensity THRR unchanged     Progression   Progression Continue to progress workloads to maintain intensity without signs/symptoms of physical distress.   Average METs 2.05     Resistance Training   Training Prescription Yes   Weight 3 lbs.   Reps 10-15     Interval Training   Interval Training No     Treadmill   MPH 1.3   Grade 0   Minutes 15   METs 2     Recumbant Bike   Level 1   RPM 50   Watts 36   Minutes 15   METs 2.92      Functional Capacity:     6 Minute Walk    Row Name 11/21/16 1220         6 Minute Walk   Phase Initial     Distance 58 feet     Walk Time 6 minutes     # of Rest Breaks 0     MPH 2.58  METS 2.02     RPE 16     Perceived Dyspnea  2     VO2 Peak 7.05     Symptoms Yes (comment)     Comments SOB, leg fatigue, legs feel wobbly, dizzy on turns      Resting HR 58 bpm     Resting BP 124/64     Max Ex. HR 109 bpm     Max Ex. BP 154/64     2 Minute Post BP 146/74  recheck 128/70        Psychological, QOL, Others - Outcomes: PHQ 2/9: Depression screen [facility] 2/9 11/21/2016 09/27/2016  Decreased Interest 3 3  Down, Depressed, Hopeless 2 3  PHQ - 2 Score 5 6  Altered sleeping 0 3  Tired, decreased energy 2 3  Change in appetite 1 3  Feeling bad or failure about yourself  3 2  Trouble concentrating 0 1  Moving slowly or fidgety/restless 0 0  Suicidal thoughts 0 1  PHQ-9 Score 11 19  Difficult doing work/chores Not difficult at all -    Quality of Life:     Quality of Life - 11/21/16 1145      Quality of Life Scores   Health/Function Pre 26.8 %   Socioeconomic Pre 28.29 %   Psych/Spiritual Pre 18.86 %   Family Pre 30 %   GLOBAL Pre 25.94 %      Personal Goals: Goals established at orientation with interventions provided to work toward goal.     Personal  Goals and Risk Factors at Admission - 11/21/16 1146      Core Components/Risk Factors/Patient Goals on Admission    Weight Management Yes;Obesity;Weight Loss   Intervention Weight Management: Develop a combined nutrition and exercise program designed to reach desired caloric intake, while maintaining appropriate intake of nutrient and fiber, sodium and fats, and appropriate energy expenditure required for the weight goal.;Weight Management: Provide education and appropriate resources to help participant work on and attain dietary goals.;Weight Management/Obesity: Establish reasonable short term and long term weight goals.  Andre reports that he has lost about 15 lbs in the past 2 months and hopes to lose more. [name] reported that he started running in his 22s and ran in [place] in 3 hours and 67minutes.   Admit Weight 258 lb 12.8 oz (117.4 kg)   Goal Weight: Short Term 250 lb (113.4 kg)   Goal Weight: Long Term 200 lb (90.7 kg)   Expected Outcomes Short Term: Continue to assess and modify interventions until short term weight is achieved;Long Term: Adherence to nutrition and physical activity/exercise program aimed toward attainment of established weight goal;Weight Loss: Understanding of general recommendations for a balanced deficit meal plan, which promotes 1-2 lb weight loss per week and includes a negative energy balance of [phone] kcal/d;Understanding of distribution of calorie intake throughout the day with the consumption of 4-5 meals/snacks;Understanding recommendations for meals to include 15-35% energy as protein, 25-35% energy from fat, 35-60% energy from carbohydrates, less than 200mg  of dietary cholesterol, 20-35 gm of total fiber daily   Heart Failure Yes   Intervention Provide a combined exercise and nutrition program that is supplemented with education, support and counseling about heart failure. Directed toward relieving symptoms such as shortness  of breath, decreased exercise tolerance, and extremity edema.   Expected Outcomes Improve functional capacity of life;Short term: Attendance in program 2-3 days a week with increased exercise capacity. Reported lower sodium intake. Reported  increased fruit and vegetable intake. Reports medication compliance.;Short term: Daily weights obtained and reported for increase. Utilizing diuretic protocols set by physician.;Long term: Adoption of self-care skills and reduction of barriers for early signs and symptoms recognition and intervention leading to self-care maintenance.   Hypertension Yes   Intervention Provide education on lifestyle modifcations including regular physical activity/exercise, weight management, moderate sodium restriction and increased consumption of fresh fruit, vegetables, and low fat dairy, alcohol moderation, and smoking cessation.;Monitor prescription use compliance.   Expected Outcomes Long Term: Maintenance of blood pressure at goal levels.;Short Term: Continued assessment and intervention until BP is < 140/47mm HG in hypertensive participants. < 130/2mm HG in hypertensive participants with diabetes, heart failure or chronic kidney disease.   Lipids Yes   Intervention Provide education and support for participant on nutrition & aerobic/resistive exercise along with prescribed medications to achieve LDL 70mg , HDL >40mg .   Expected Outcomes Short Term: Participant states understanding of desired cholesterol values and is compliant with medications prescribed. Participant is following exercise prescription and nutrition guidelines.;Long Term: Cholesterol controlled with medications as prescribed, with individualized exercise RX and with personalized nutrition plan. Value goals: LDL < 70mg , HDL > 40 mg.       Personal Goals Discharge:     Goals and Risk Factor Review    Row Name 12/23/16 1223             Core Components/Risk Factors/Patient Goals Review   Personal Goals  Review Weight Management/Obesity;Heart Failure;Hypertension;Lipids       Review I left a vm on his cell phone that we hope to see him soon back in Cardiac Rehab. I said I saw the note from his doctors' office and I believe it will help his breathing.  He has not lost much weight. Hopefully he will be more consistent in his exercising in Cardiac REhab       Expected Outcomes Completion of Cardiac Rehab to help with a heart healthy lifestyle.          Exercise Goals and Review:     Exercise Goals    Row Name 11/21/16 1229             Exercise Goals   Increase Physical Activity Yes       Intervention Provide advice, education, support and counseling about physical activity/exercise needs.;Develop an individualized exercise prescription for aerobic and resistive training based on initial evaluation findings, risk stratification, comorbidities and participant's personal goals.       Expected Outcomes Achievement of increased cardiorespiratory fitness and enhanced flexibility, muscular endurance and strength shown through measurements of functional capacity and personal statement of participant.       Increase Strength and Stamina Yes       Intervention Provide advice, education, support and counseling about physical activity/exercise needs.;Develop an individualized exercise prescription for aerobic and resistive training based on initial evaluation findings, risk stratification, comorbidities and participant's personal goals.       Expected Outcomes Achievement of increased cardiorespiratory fitness and enhanced flexibility, muscular endurance and strength shown through measurements of functional capacity and personal statement of participant.          Nutrition & Weight - Outcomes:     Pre Biometrics - 11/21/16 1229      Pre Biometrics   Height 5' 6.8" (1.697 m)   Weight 258 lb 12.8 oz (117.4 kg)   Waist Circumference 46 inches   Hip Circumference 46 inches   Waist to Hip Ratio 1 %  BMI (Calculated) 40.9   Single Leg Stand 7.53 seconds       Nutrition:     Nutrition Therapy & Goals - 12/23/16 1224      Nutrition Therapy   RD appointment defered Yes      Nutrition Discharge:     Nutrition Assessments - 11/21/16 1158      MEDFICTS Scores   Pre Score 26      Education Questionnaire Score:     Knowledge Questionnaire Score - 11/21/16 1154      Knowledge Questionnaire Score   Pre Score 26/28      Goals reviewed with patient; copy given to patient.

## 2017-01-03 NOTE — Telephone Encounter (Signed)
Patient wife calling to ask about meds for depression .  She was advised to also check with pcp office but we will let Dr. Fletcher Anon know.

## 2017-01-04 ENCOUNTER — Encounter: Payer: Self-pay | Admitting: Family Medicine

## 2017-01-04 ENCOUNTER — Encounter: Payer: Self-pay | Admitting: Neurology

## 2017-01-04 ENCOUNTER — Ambulatory Visit (INDEPENDENT_AMBULATORY_CARE_PROVIDER_SITE_OTHER): Payer: PPO | Admitting: Family Medicine

## 2017-01-04 DIAGNOSIS — F329 Major depressive disorder, single episode, unspecified: Secondary | ICD-10-CM

## 2017-01-04 MED ORDER — SERTRALINE HCL 50 MG PO TABS: 50.0000 mg | ORAL_TABLET | Freq: Every day | ORAL | 1 refills | Status: DC

## 2017-01-04 NOTE — Patient Instructions (Signed)
Zoloft as prescribed.  Follow up in 6 weeks with [facility]  Take care  Dr. Lacinda Axon

## 2017-01-05 DIAGNOSIS — F329 Major depressive disorder, single episode, unspecified: Secondary | ICD-10-CM | POA: Insufficient documentation

## 2017-01-05 DIAGNOSIS — F32A Depression, unspecified: Secondary | ICD-10-CM | POA: Insufficient documentation

## 2017-01-05 DIAGNOSIS — F419 Anxiety disorder, unspecified: Secondary | ICD-10-CM | POA: Insufficient documentation

## 2017-01-05 NOTE — Progress Notes (Signed)
Subjective:  Patient ID: Andre [name], male    DOB: 09/07/1935  Age: 81 y.o. MRN: [id number]  CC: Depression  HPI:  81 year old gentleman with an extensive history including a recent NSTEMI presents with the above concern.  Patient and his wife report that he has little interest/Drive in doing things. This is been going on since he had his recent MI. He has little interest in doing his hobbies. He states that he just simply wants to sleep and lie around the house. Feeling down. Moderate in severity. He states that his energy level is good he just doesn't have the interest. No reports of chest pain. No worsening shortness of breath. Endorses compliance with CPAP. Both he and his wife are concerned about depression and would like to discuss this in depth today.  Social Hx   Social History   Social History  . Marital status: Married    Spouse name: [name]  . Number of children: 2  . Years of education: 12   Occupational History  . Retired     Designer, television/film set   Social History Main Topics  . Smoking status: Former Smoker    Packs/day: 1.00    Years: 40.00    Types: Cigarettes, Pipe, Cigars    Quit date: 04/18/1972  . Smokeless tobacco: Former Systems developer    Types: Chew    Quit date: 04/18/1972  . Alcohol use 2.4 oz/week    2 Cans of beer, 2 Shots of liquor per week     Comment: per 2 weeks   . Drug use: No  . Sexual activity: Not Currently   Other Topics Concern  . None   Social History Narrative   Lives w/ wife   Caffeine use: none   Right-handed    Review of Systems  Constitutional: Negative for fatigue.  Respiratory: Positive for shortness of breath.   Cardiovascular: Negative for chest pain.  Psychiatric/Behavioral:       Feeling down/little interest in doing things.   Objective:  BP (!) 160/72 (BP Location: Left Arm, Patient Position: Sitting, Cuff Size: Large)   Pulse 62   Temp 97.8 F (36.6 C) (Oral)   Resp 18   Wt 263 lb (119.3 kg)   SpO2 94%   BMI  41.44 kg/m   BP/Weight 01/04/2017 7/00/1749 07/20/9673  Systolic BP 916 384 -  Diastolic BP 72 82 -  Wt. (Lbs) 263 260 258.8  BMI 41.44 40.97 40.78    Physical Exam  Constitutional: He is oriented to person, place, and time. He appears well-developed. No distress.  Cardiovascular: Normal rate and regular rhythm.   Pulmonary/Chest: Effort normal. He has no wheezes. He has no rales.  Neurological: He is alert and oriented to person, place, and time.  Psychiatric: He has a normal mood and affect.  Vitals reviewed.   Lab Results  Component Value Date   WBC 9.3 11/29/2016   HGB 14.9 11/29/2016   HCT 44.6 11/29/2016   PLT 120 (L) 11/29/2016   GLUCOSE 111 (H) 11/01/2016   CHOL 183 10/29/2016   TRIG 229 (H) 10/29/2016   HDL 45 10/29/2016   LDLCALC 92 10/29/2016   ALT 13 (L) 01/29/2016   AST 23 01/29/2016   NA 139 11/01/2016   K 3.6 11/01/2016   CL 104 11/01/2016   CREATININE 1.24 11/01/2016   BUN 20 11/01/2016   CO2 26 11/01/2016   TSH 3.31 10/27/2016   INR 1.22 10/28/2016    Assessment & Plan:  Problem List Items Addressed This Visit    Depression    New problem. Starting on Zoloft.      Relevant Medications   sertraline (ZOLOFT) 50 MG tablet      Meds ordered this encounter  Medications  . sertraline (ZOLOFT) 50 MG tablet    Sig: Take 1 tablet (50 mg total) by mouth daily.    Dispense:  90 tablet    Refill:  1     Follow-up: Return in about 6 weeks (around 02/15/2017).  [place]

## 2017-01-05 NOTE — Assessment & Plan Note (Signed)
New problem.  Starting on Zoloft. 

## 2017-01-26 ENCOUNTER — Other Ambulatory Visit: Payer: Self-pay | Admitting: Internal Medicine

## 2017-01-26 NOTE — Telephone Encounter (Signed)
Requested Prescriptions   Signed Prescriptions Disp Refills  . furosemide (LASIX) 40 MG tablet 30 tablet 3    Sig: Take 0.5 tablets (20 mg total) by mouth every other day.    Authorizing Provider: Deboraha Sprang    Ordering User: Janan Ridge

## 2017-02-01 ENCOUNTER — Ambulatory Visit (INDEPENDENT_AMBULATORY_CARE_PROVIDER_SITE_OTHER): Payer: PPO | Admitting: Neurology

## 2017-02-01 ENCOUNTER — Encounter: Payer: Self-pay | Admitting: Neurology

## 2017-02-01 VITALS — BP 192/94 | HR 64 | Ht 66.8 in | Wt 261.5 lb

## 2017-02-01 DIAGNOSIS — G20 Parkinson's disease: Secondary | ICD-10-CM | POA: Diagnosis not present

## 2017-02-01 MED ORDER — CARBIDOPA-LEVODOPA 25-250 MG PO TABS: 2.0000 | ORAL_TABLET | Freq: Three times a day (TID) | ORAL | 3 refills | Status: DC

## 2017-02-01 NOTE — Progress Notes (Signed)
Reason for visit: Parkinson's disease  Andre [name] is an 81 y.o. male  History of present illness:  Andre [name] is a 81 year old right-handed white male with a history of Parkinson's disease. Andre [name] has had very slowly progressive symptoms over time. Andre original diagnosis of Parkinson's disease was in 2010. Andre [name] is on relatively high dose of Sinemet taking Andre 25/250 mg tablets, taking 2 tablets 3 times daily. If he does not take Andre medication with food he may have nausea. Andre [name] has not had any falls since last seen, he did require a coronary artery stent following a myocardial infarction. He has had some shortness of breath since that time. He is trying to become more active, he is trying to walk on a regular basis and he is thinking about getting into water aerobics. He returns to this office for an evaluation. He believes that his left arm tremors have improved. He is not having any problems with chewing or swallowing.  Past Medical History:  Diagnosis Date  . Cervical spondylosis 10/01/2013  . Chronic diastolic CHF (congestive heart failure) ([place])    a. 07/2016 Echo: >55%.  . Coronary artery disease    a. 1998 s/p mini-cabg @ [facility] - pt reports one vessel bypass to LAD;  b. 07/2016 St Echo:  Inadequate HR (max 97) w/ hypertensive response (220/96). Ex time only 2:54 - stopped due to dyspnea and leg pain;  c.  08/2016 MV: EF 67%, no ischemia, low risk.  . Depression   . GERD (gastroesophageal reflux disease)   . Hyperlipidemia   . Hypertension   . PAF (paroxysmal atrial fibrillation) (HCC)    a. s/p DCCV-->maintaining sinus on amiodarone;  b. CHA2DS2VASc = 5-->eliquis.  . Parkinson's disease ([place])    tremors  . Pulmonary embolism ([place]) 2011  . 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 Sleep apnea    wears CPAP    Past Surgical History:  Procedure Laterality Date  . BACK SURGERY  1960  . CARDIAC CATHETERIZATION    . CHOLECYSTECTOMY  2010  . CORONARY ARTERY BYPASS  GRAFT  01/07/1997  . CORONARY STENT INTERVENTION N/A 10/31/2016   Procedure: Coronary Stent Intervention;  Surgeon: Wellington Hampshire, MD;  Location: [facility];  Service: Cardiovascular;  Laterality: N/A;  . ELECTROPHYSIOLOGIC STUDY N/A 07/14/2015   Procedure: CARDIOVERSION;  Surgeon: Yolonda Kida, MD;  Location: [facility];  Service: Cardiovascular;  Laterality: N/A;  . ELECTROPHYSIOLOGIC STUDY N/A 10/12/2015   Procedure: CARDIOVERSION;  Surgeon: Minna Merritts, MD;  Location: [facility];  Service: Cardiovascular;  Laterality: N/A;  . LEFT HEART CATH AND CORONARY ANGIOGRAPHY N/A 10/31/2016   Procedure: Left Heart Cath and Coronary Angiography;  Surgeon: Wellington Hampshire, MD;  Location: [facility];  Service: Cardiovascular;  Laterality: N/A;  . OTHER SURGICAL HISTORY  1998   Bypass    Family History  Problem Relation Age of Onset  . Family history unknown: Yes    Social history:  reports that he quit smoking about 44 years ago. His smoking use included Cigarettes, Pipe, and Cigars. He has a 40.00 pack-year smoking history. He quit smokeless tobacco use about 44 years ago. His smokeless tobacco use included Chew. He reports that he drinks about 2.4 oz of alcohol per week . He reports that he does not use drugs.    Allergies  Allergen Reactions  . Pravastatin Other (See Comments)  . Prednisone Other (See Comments)    Pt states  that med makes him hyper Pt states that med makes him hyper    Medications:  Prior to Admission medications   Medication Sig Start Date End Date Taking? Authorizing Provider  acetaminophen (TYLENOL) 500 MG tablet Take 500 mg by mouth every 6 (six) hours as needed.   Yes [provider]  amiodarone (PACERONE) 200 MG tablet Take 200 mg by mouth daily.  07/30/15  Yes [provider]  apixaban (ELIQUIS) 5 MG TABS tablet Take 5 mg by mouth 2 (two) times daily. Reported on 07/31/2015   Yes [provider]    carbidopa-levodopa (PARCOPA) 25-250 MG per disintegrating tablet Take 2 tablets by mouth 3 (three) times daily. Reported on 07/14/2015   Yes [provider]  carvedilol (COREG) 6.25 MG tablet TAKE ONE TABLET BY MOUTH TWICE DAILY WITH A MEAL 11/25/16  Yes Wellington Hampshire, MD  clopidogrel (PLAVIX) 75 MG tablet TAKE ONE TABLET BY MOUTH EVERY DAY WITH BREAKFAST 11/25/16  Yes Wellington Hampshire, MD  cyclobenzaprine (FLEXERIL) 5 MG tablet Take 5 mg by mouth daily.   Yes [provider]  EPINEPHrine 0.3 mg/0.3 mL IJ SOAJ injection Inject 0.3 mg as directed once as needed (allergic reaction). Reported on 10/27/2015 07/01/13  Yes [provider]  esomeprazole (NEXIUM) 20 MG capsule Take 40 mg by mouth daily at 12 noon.   Yes [provider]  fluticasone (FLONASE) 50 MCG/ACT nasal spray TAKE 2 PUFFS IN EACH NOSTRIL EVERY DAY 10/18/16  Yes Cook, Jayce G, DO  furosemide (LASIX) 40 MG tablet Take 0.5 tablets (20 mg total) by mouth every other day. 01/26/17  Yes Deboraha Sprang, MD  Iodoquinol-HC-Aloe Polysacch (ALCORTIN A) 1-2-1 % GEL Apply 1 application topically daily. 12/08/16  Yes [provider]  isosorbide mononitrate (IMDUR) 30 MG 24 hr tablet TAKE ONE TABLET BY MOUTH EVERY DAY 11/25/16  Yes Wellington Hampshire, MD  levothyroxine (SYNTHROID, LEVOTHROID) 50 MCG tablet 50 mcg daily before breakfast.  09/07/16  Yes [provider]  losartan (COZAAR) 25 MG tablet TAKE ONE TABLET BY MOUTH EVERY DAY 11/25/16  Yes Wellington Hampshire, MD  mirabegron ER (MYRBETRIQ) 50 MG TB24 tablet Take 50 mg by mouth daily.   Yes [provider]  sertraline (ZOLOFT) 50 MG tablet Take 1 tablet (50 mg total) by mouth daily. [name] taking differently: Take 0.5 mg by mouth daily.  01/04/17  Yes Cook, Jayce G, DO  VENTOLIN HFA 108 (90 Base) MCG/ACT inhaler TAKE 2 PUFFS EVERY 8 HOURS AS NEEDED FORWHEEZING 12/02/16  Yes Cook, Jayce G, DO    ROS:  Out of a complete 14 system review  of symptoms, Andre [name] complains only of Andre following symptoms, and all other reviewed systems are negative.  Tremors  Blood pressure (!) 192/94, pulse 64, height 5' 6.8" (1.697 m), weight 261 lb 8 oz (118.6 kg).  Physical Exam  General: Andre [name] is alert and cooperative at Andre time of Andre examination. Andre [name] is markedly obese.  Skin: 1+ edema at Andre ankles is noted.   Neurologic Exam  Mental status: Andre [name] is alert and oriented x 3 at Andre time of Andre examination. Andre [name] has apparent normal recent and remote memory, with an apparently normal attention span and concentration ability.   Cranial nerves: Facial symmetry is present. Speech is normal, no aphasia or dysarthria is noted. Extraocular movements are full. Visual fields are full. Minimal masking of Andre face is seen. A jaw tremor is noted on  occasion.  Motor: Andre [name] has good strength in all 4 extremities.  Sensory examination: Soft touch sensation is symmetric on Andre face, arms, and legs.  Coordination: Andre [name] has good finger-nose-finger and heel-to-shin bilaterally. A mild left hand resting tremor is noted.  Gait and station: Andre [name] is able to arise from a seated position with arms crossed. Andre [name] has a slightly wide-based gait, he has good arm swing, good turns. Tandem gait is unsteady. Romberg is negative.  Reflexes: Deep tendon reflexes are symmetric.   Assessment/Plan:  1. Parkinson's disease  Some of Andre features with this [name] are somewhat unusual for Parkinson's disease. He has a wide-based gait, he has had an extremely slow progression of symptoms over 8 years or more. Andre [name] is encouraged to stay active. He was given a prescription for his Sinemet. He will follow-up in 6 months.   Jill Alexanders MD 02/01/2017 11:43 AM  [facility] [address] [place], [place] [address]  Phone [phone] Fax [phone]

## 2017-02-06 ENCOUNTER — Other Ambulatory Visit: Payer: Self-pay | Admitting: Cardiovascular Disease

## 2017-02-06 DIAGNOSIS — H43393 Other vitreous opacities, bilateral: Secondary | ICD-10-CM | POA: Diagnosis not present

## 2017-02-06 DIAGNOSIS — R7309 Other abnormal glucose: Secondary | ICD-10-CM | POA: Diagnosis not present

## 2017-02-06 DIAGNOSIS — H2513 Age-related nuclear cataract, bilateral: Secondary | ICD-10-CM | POA: Diagnosis not present

## 2017-02-06 DIAGNOSIS — H04123 Dry eye syndrome of bilateral lacrimal glands: Secondary | ICD-10-CM | POA: Diagnosis not present

## 2017-02-06 DIAGNOSIS — H524 Presbyopia: Secondary | ICD-10-CM | POA: Diagnosis not present

## 2017-02-07 DIAGNOSIS — G4733 Obstructive sleep apnea (adult) (pediatric): Secondary | ICD-10-CM | POA: Diagnosis not present

## 2017-02-08 ENCOUNTER — Ambulatory Visit (INDEPENDENT_AMBULATORY_CARE_PROVIDER_SITE_OTHER): Payer: PPO | Admitting: Family Medicine

## 2017-02-08 ENCOUNTER — Ambulatory Visit: Payer: PPO | Admitting: Family Medicine

## 2017-02-08 ENCOUNTER — Encounter: Payer: Self-pay | Admitting: Family Medicine

## 2017-02-08 VITALS — BP 152/84 | HR 67 | Temp 97.9°F | Wt 262.8 lb

## 2017-02-08 DIAGNOSIS — G20 Parkinson's disease: Secondary | ICD-10-CM | POA: Diagnosis not present

## 2017-02-08 DIAGNOSIS — I25119 Atherosclerotic heart disease of native coronary artery with unspecified angina pectoris: Secondary | ICD-10-CM

## 2017-02-08 DIAGNOSIS — I10 Essential (primary) hypertension: Secondary | ICD-10-CM

## 2017-02-08 DIAGNOSIS — Z23 Encounter for immunization: Secondary | ICD-10-CM

## 2017-02-08 DIAGNOSIS — T148XXA Other injury of unspecified body region, initial encounter: Secondary | ICD-10-CM | POA: Insufficient documentation

## 2017-02-08 DIAGNOSIS — R7309 Other abnormal glucose: Secondary | ICD-10-CM | POA: Diagnosis not present

## 2017-02-08 DIAGNOSIS — F329 Major depressive disorder, single episode, unspecified: Secondary | ICD-10-CM

## 2017-02-08 DIAGNOSIS — G4733 Obstructive sleep apnea (adult) (pediatric): Secondary | ICD-10-CM

## 2017-02-08 DIAGNOSIS — Z6841 Body Mass Index (BMI) 40.0 and over, adult: Secondary | ICD-10-CM

## 2017-02-08 LAB — BASIC METABOLIC PANEL
BUN: 20 mg/dL (ref 6–23)
CO2: 29 mEq/L (ref 19–32)
Calcium: 9.4 mg/dL (ref 8.4–10.5)
Chloride: 103 mEq/L (ref 96–112)
Creatinine, Ser: 1.13 mg/dL (ref 0.40–1.50)
GFR: 66.49 mL/min (ref >60.00)
Glucose, Bld: 125 mg/dL — ABNORMAL HIGH (ref 70–99)
Potassium: 4 mEq/L (ref 3.5–5.1)
Sodium: 139 mEq/L (ref 135–145)

## 2017-02-08 LAB — HEMOGLOBIN A1C: Hgb A1c MFr Bld: 6.2 % (ref 4.6–6.5)

## 2017-02-08 MED ORDER — LOSARTAN POTASSIUM 50 MG PO TABS: 50.0000 mg | ORAL_TABLET | Freq: Every day | ORAL | 3 refills | Status: DC

## 2017-02-08 NOTE — Assessment & Plan Note (Signed)
Encourage dietary changes. Advised exercise though he will discuss with his cardiologist first.

## 2017-02-08 NOTE — Assessment & Plan Note (Signed)
Above goal. We will increase losartan. Check BMP today. He'll need a BMP in 1 week. Potentially could have this through cardiology as he sees them in 1 week and I will message his cardiologist regarding this.

## 2017-02-08 NOTE — Progress Notes (Signed)
  Andre Rumps, MD Phone: [phone]  Andre [name] is a 81 y.o. male who presents today for follow-up.  Depression: Notes he is doing fairly well on 25 mg of Zoloft. He tried increased to 50 mg at this made him drowsy. He notes his been on this for about 4 weeks. Notes he still not enjoying things as much as prior though has improved somewhat.  He is wearing his CPAP nightly for 7-8 hours. Some decreased energy when he wakes up.  Following with neurology for his Parkinson's. Notes stable left hand tremor over the last 10 years.  Patient underwent cardiac catheterization with stent placement earlier this year. Currently on Plavix and Eliquis. No chest pain. Notes his breathing is stable. Has had chronic slight dyspnea but does not take long to recover from. Does have a history of A. fib though currently in sinus rhythm.  Patient [place] when he was going to reattach something and part of the dock went out from under him and he fell into the sound. Has an abrasion on his right dorsal forearm. Slight abrasion to his posterior scalp though no head injury. No loss of consciousness. No other injuries. This occurred 5 days ago. No recent tetanus vaccination. There've been no signs of infection. No drainage. No spreading redness. They've been placing antibiotic ointment on his arm wound.  PMH: Former smoker   ROS see history of present illness  Objective  Physical Exam Vitals:   02/08/17 1040  BP: (!) 152/84  Pulse: 67  Temp: 97.9 F (36.6 C)  SpO2: 94%  Recheck of BP 178/80  BP Readings from Last 3 Encounters:  02/08/17 (!) 152/84  02/01/17 (!) 192/94  01/04/17 (!) 160/72   Wt Readings from Last 3 Encounters:  02/08/17 262 lb 12.8 oz (119.2 kg)  02/01/17 261 lb 8 oz (118.6 kg)  01/04/17 263 lb (119.3 kg)    Physical Exam  Constitutional: No distress.  Cardiovascular: Normal rate, regular rhythm and normal heart sounds.     Pulmonary/Chest: Effort normal and breath sounds normal.  Musculoskeletal: He exhibits no edema.  Abrasion right mid dorsal forearm appears to be well-healing with no signs of infection, nontender, no drainage, well-healing abrasion right posterior scalp with no underlying defects  Neurological: He is alert. Gait normal.  Skin: Skin is warm and dry. He is not diaphoretic.     Assessment/Plan: Please see individual problem list.  Coronary artery disease involving native coronary artery of native heart with angina pectoris [facility]) Follow-up with cardiology next week. Overall stable.  OSA (obstructive sleep apnea) Stable. Continue CPAP.  Parkinson's disease ([place]) Stable. Continue to follow with neurology.  Depression Slight improvement with Zoloft. He'll trial 50 mg again and if it makes him drowsy he'll try taking it at night. If still drowsy with that he'll go back to 25 mg and let us know.  BMI 40.0-44.9, adult (HCC) Encourage dietary changes. Advised exercise though he will discuss with his cardiologist first.  Abrasion Abrasion on right forearm. No signs of infection. He will monitor for any signs of infection and be reevaluated if they occur.  Essential hypertension Above goal. We will increase losartan. Check BMP today. He'll need a BMP in 1 week. Potentially could have this through cardiology as he sees them in 1 week and I will message his cardiologist regarding this.  Andre Rumps, MD [place]

## 2017-02-08 NOTE — Assessment & Plan Note (Signed)
Stable.  -Continue CPAP

## 2017-02-08 NOTE — Assessment & Plan Note (Signed)
Stable. Continue to follow with neurology.  °

## 2017-02-08 NOTE — Assessment & Plan Note (Signed)
Slight improvement with Zoloft. He'll trial 50 mg again and if it makes him drowsy he'll try taking it at night. If still drowsy with that he'll go back to 25 mg and let us know.

## 2017-02-08 NOTE — Assessment & Plan Note (Signed)
Follow-up with cardiology next week. Overall stable.

## 2017-02-08 NOTE — Assessment & Plan Note (Signed)
Abrasion on right forearm. No signs of infection. He will monitor for any signs of infection and be reevaluated if they occur.

## 2017-02-08 NOTE — Patient Instructions (Addendum)
Nice to see you. Please monitor your right forearm for signs of infection such as redness, warmth, drainage, or fever. Please keep your appointment with cardiologist. If you're unable to tolerate the whole tablet of Zoloft please go back to half a tablet.  Diet Recommendations  Starchy (carb) foods: Bread, rice, pasta, potatoes, corn, cereal, grits, crackers, bagels, muffins, all baked goods.  (Fruits, milk, and yogurt also have carbohydrate, but most of these foods will not spike your blood sugar as the starchy foods will.)  A few fruits do cause high blood sugars; use small portions of bananas (limit to 1/2 at a time), grapes, watermelon, oranges, and most tropical fruits.    Protein foods: Meat, fish, poultry, eggs, dairy foods, and beans such as pinto and kidney beans (beans also provide carbohydrate).   1. Eat at least 3 meals and 1-2 snacks per day. Never go more than 4-5 hours while awake without eating. Eat breakfast within the first hour of getting up.   2. Limit starchy foods to TWO per meal and ONE per snack. ONE portion of a starchy  food is equal to the following:   - ONE slice of bread (or its equivalent, such as half of a hamburger bun).   - 1/2 cup of a "scoopable" starchy food such as potatoes or rice.   - 15 grams of carbohydrate as shown on food label.  3. Include at every meal: a protein food, a carb food, and vegetables and/or fruit.   - Obtain twice the volume of veg's as protein or carbohydrate foods for both lunch and dinner.   - Fresh or frozen veg's are best.   - Keep frozen veg's on hand for a quick vegetable serving.

## 2017-02-14 ENCOUNTER — Ambulatory Visit (INDEPENDENT_AMBULATORY_CARE_PROVIDER_SITE_OTHER): Payer: PPO | Admitting: Cardiovascular Disease

## 2017-02-14 ENCOUNTER — Encounter: Payer: Self-pay | Admitting: Cardiovascular Disease

## 2017-02-14 VITALS — BP 160/80 | HR 60 | Ht 66.5 in | Wt 261.2 lb

## 2017-02-14 DIAGNOSIS — I10 Essential (primary) hypertension: Secondary | ICD-10-CM | POA: Diagnosis not present

## 2017-02-14 DIAGNOSIS — I480 Paroxysmal atrial fibrillation: Secondary | ICD-10-CM

## 2017-02-14 DIAGNOSIS — I5030 Unspecified diastolic (congestive) heart failure: Secondary | ICD-10-CM | POA: Diagnosis not present

## 2017-02-14 DIAGNOSIS — E785 Hyperlipidemia, unspecified: Secondary | ICD-10-CM

## 2017-02-14 DIAGNOSIS — I25118 Atherosclerotic heart disease of native coronary artery with other forms of angina pectoris: Secondary | ICD-10-CM | POA: Diagnosis not present

## 2017-02-14 MED ORDER — LOSARTAN POTASSIUM 100 MG PO TABS
100.0000 mg | ORAL_TABLET | Freq: Every day | ORAL | 3 refills | Status: DC
Start: 2017-02-14 — End: 2017-09-21

## 2017-02-14 MED ORDER — ISOSORBIDE MONONITRATE ER 60 MG PO TB24: 60.0000 mg | ORAL_TABLET | Freq: Every day | ORAL | 3 refills | Status: DC

## 2017-02-14 MED ORDER — ROSUVASTATIN CALCIUM 10 MG PO TABS: 10.0000 mg | ORAL_TABLET | Freq: Every day | ORAL | 3 refills | Status: DC

## 2017-02-14 NOTE — Patient Instructions (Addendum)
Medication Instructions:  Your physician has recommended you make the following change in your medication:  INCREASE losartan to 100mg  once daily INCREASE imdur to 60mg  once daily START taking rosuvastatin 10mg  once daily   Labwork: BMET in one week at the [facility] lab. No appt needed.  Testing/Procedures: none  Follow-Up: Your physician recommends that you schedule a follow-up appointment in: 3 months with Dr. Fletcher Anon.    Any Other Special Instructions Will Be Listed Below (If Applicable).     If you need a refill on your cardiac medications before your next appointment, please call your pharmacy.  Rosuvastatin Tablets What is this medicine? ROSUVASTATIN (roe SOO va sta tin) is known as a HMG-CoA reductase inhibitor or 'statin'. It lowers cholesterol and triglycerides in the blood. This drug may also reduce the risk of heart attack, stroke, or other health problems in patients with risk factors for heart disease. Diet and lifestyle changes are often used with this drug. This medicine may be used for other purposes; ask your health care provider or pharmacist if you have questions. COMMON BRAND NAME(S): Crestor What should I tell my health care provider before I take this medicine? They need to know if you have any of these conditions: -frequently drink alcoholic beverages -kidney disease -liver disease -muscle aches or weakness -other medical condition -an unusual or allergic reaction to rosuvastatin, other medicines, foods, dyes, or preservatives -pregnant or trying to get pregnant -breast-feeding How should I use this medicine? Take this medicine by mouth with a glass of water. Follow the directions on the prescription label. Do not cut, crush or chew this medicine. You can take this medicine with or without food. Take your doses at regular intervals. Do not take your medicine more often than directed. Talk to your pediatrician regarding the use of this medicine in  children. While this drug may be prescribed for children as young as 42 years old for selected conditions, precautions do apply. Overdosage: If you think you have taken too much of this medicine contact a poison control center or emergency room at once. NOTE: This medicine is only for you. Do not share this medicine with others. What if I miss a dose? If you miss a dose, take it as soon as you can. Do not take 2 doses within 12 hours of each other. If there are less than 12 hours until your next dose, take only that dose. Do not take double or extra doses. What may interact with this medicine? Do not take this medicine with any of the following medications: -herbal medicines like red yeast rice This medicine may also interact with the following medications: -alcohol -antacids containing aluminum hydroxide or magnesium hydroxide -cyclosporine -other medicines for high cholesterol -some medicines for HIV infection -warfarin This list may not describe all possible interactions. Give your health care provider a list of all the medicines, herbs, non-prescription drugs, or dietary supplements you use. Also tell them if you smoke, drink alcohol, or use illegal drugs. Some items may interact with your medicine. What should I watch for while using this medicine? Visit your doctor or health care professional for regular check-ups. You may need regular tests to make sure your liver is working properly. Tell your doctor or health care professional right away if you get any unexplained muscle pain, tenderness, or weakness, especially if you also have a fever and tiredness. Your doctor or health care professional may tell you to stop taking this medicine if you develop muscle problems.  If your muscle problems do not go away after stopping this medicine, contact your health care professional. This medicine may affect blood sugar levels. If you have diabetes, check with your doctor or health care professional  before you change your diet or the dose of your diabetic medicine. Avoid taking antacids containing aluminum, calcium or magnesium within 2 hours of taking this medicine. This drug is only part of a total heart-health program. Your doctor or a dietician can suggest a low-cholesterol and low-fat diet to help. Avoid alcohol and smoking, and keep a proper exercise schedule. Do not use this drug if you are pregnant or breast-feeding. Serious side effects to an unborn child or to an infant are possible. Talk to your doctor or pharmacist for more information. What side effects may I notice from receiving this medicine? Side effects that you should report to your doctor or health care professional as soon as possible: -allergic reactions like skin rash, itching or hives, swelling of the face, lips, or tongue -dark urine -fever -joint pain -muscle cramps, pain -redness, blistering, peeling or loosening of the skin, including inside the mouth -trouble passing urine or change in the amount of urine -unusually weak or tired -yellowing of the eyes or skin Side effects that usually do not require medical attention (report to your doctor or health care professional if they continue or are bothersome): -constipation -heartburn -nausea -stomach gas, pain, upset This list may not describe all possible side effects. Call your doctor for medical advice about side effects. You may report side effects to FDA at 1-800-FDA-1088. Where should I keep my medicine? Keep out of the reach of children. Store at room temperature between 20 and 25 degrees C (68 and 77 degrees F). Keep container tightly closed (protect from moisture). Throw away any unused medicine after the expiration date. NOTE: This sheet is a summary. It may not cover all possible information. If you have questions about this medicine, talk to your doctor, pharmacist, or health care provider.  2018 Elsevier/Gold Standard (2014-09-18 13:33:08)

## 2017-02-14 NOTE — Progress Notes (Signed)
Cardiology Office Note   Date:  02/14/2017   ID:  Andre [name], DOB [birth year], MRN [id number]  PCP:  [name], MD  Cardiologist:   [name], MD   Chief Complaint  Patient presents with  . other    3 month follow up. Meds reviewed by the pt. verbally. Pt. c/o shortness of breath.       History of Present Illness: Andre [name] is a 82 y.o. male who presents for a follow-up visit regarding coronary artery disease.   He has known history of coronary artery disease status post CABG with LIMA to LAD, hypertension, hyperlipidemia, chronic diastolic heart aure, paroxysmal atrial fibrillation and morbid obesity. He also has history of pulmonary embolism and Parkinson's He was hospitalized in July of this year with a small non-ST elevation myocardial infarction. Echocardiogram showed normal LV systolic function with severely dilated left atrium. Cardiac catheterization showed occluded native LAD with patent LIMA, severe proximal RCA stenosis with heavy thrombus and moderate distal left main stenosis with significant ostial stenosis in the ramus branch. EF was 50-55% with mildly elevated left ventricular end-diastolic pressure. I performed successful angioplasty and drug-eluting stent placement to the right coronary artery which was complicated by embolization into a large RV branch at the lesion site. This was treated with balloon angioplasty which established TIMI 1 flow. He has been doing reasonably well and reports gradual improvement in shortness of breath.  No left arm discomfort which was his prior angina.  He does not feel back to baseline and continues to have fatigue.  Blood pressure continues to be elevated.  Losartan was increased last week by Dr. [name] but blood pressure still elevated.  He stopped taking atorvastatin due to myalgia.   Past Medical History:  Diagnosis Date  . Cervical spondylosis 10/01/2013  . Chronic diastolic CHF (congestive heart failure)  ([place])    a. 07/2016 Echo: >55%.  . Coronary artery disease    a. 1998 s/p mini-cabg @ [facility] - pt reports one vessel bypass to LAD;  b. 07/2016 St Echo:  Inadequate HR (max 97) w/ hypertensive response (220/96). Ex time only 2:54 - stopped due to dyspnea and leg pain;  c.  08/2016 MV: EF 67%, no ischemia, low risk.  . Depression   . GERD (gastroesophageal reflux disease)   . Hyperlipidemia   . Hypertension   . PAF (paroxysmal atrial fibrillation) ([facility])    a. s/p DCCV-->maintaining sinus on amiodarone;  b. CHA2DS2VASc = 5-->eliquis.  . Parkinson's disease ([place])    tremors  . Pulmonary embolism ([place]) 2011  . 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 Sleep apnea    wears CPAP    Past Surgical History:  Procedure Laterality Date  . BACK SURGERY  1960  . CARDIAC CATHETERIZATION    . CHOLECYSTECTOMY  2010  . CORONARY ARTERY BYPASS GRAFT  01/07/1997  . CORONARY STENT INTERVENTION N/A 10/31/2016   Procedure: Coronary Stent Intervention;  Surgeon: Wellington Hampshire, MD;  Location: [facility];  Service: Cardiovascular;  Laterality: N/A;  . ELECTROPHYSIOLOGIC STUDY N/A 07/14/2015   Procedure: CARDIOVERSION;  Surgeon: Yolonda Kida, MD;  Location: [facility];  Service: Cardiovascular;  Laterality: N/A;  . ELECTROPHYSIOLOGIC STUDY N/A 10/12/2015   Procedure: CARDIOVERSION;  Surgeon: Minna Merritts, MD;  Location: [facility];  Service: Cardiovascular;  Laterality: N/A;  . LEFT HEART CATH AND CORONARY ANGIOGRAPHY N/A 10/31/2016   Procedure: Left Heart Cath and Coronary Angiography;  Surgeon: Wellington Hampshire,  MD;  Location: [facility];  Service: Cardiovascular;  Laterality: N/A;  . OTHER SURGICAL HISTORY  1998   Bypass     Current Outpatient Prescriptions  Medication Sig Dispense Refill  . acetaminophen (TYLENOL) 500 MG tablet Take 500 mg by mouth every 6 (six) hours as needed.    [name] amiodarone (PACERONE) 200 MG tablet Take 200 mg by mouth daily.     . carbidopa-levodopa (SINEMET)  25-250 MG tablet Take 2 tablets by mouth 3 (three) times daily. 540 tablet 3  . carvedilol (COREG) 6.25 MG tablet TAKE ONE TABLET BY MOUTH TWICE DAILY WITH A MEAL 60 tablet 3  . clopidogrel (PLAVIX) 75 MG tablet TAKE ONE TABLET BY MOUTH EVERY DAY WITH BREAKFAST 30 tablet 3  . cyclobenzaprine (FLEXERIL) 5 MG tablet Take 5 mg by mouth daily.    [name] ELIQUIS 5 MG TABS tablet TAKE ONE TABLET TWICE DAILY 60 tablet 6  . EPINEPHrine 0.3 mg/0.3 mL IJ SOAJ injection Inject 0.3 mg as directed once as needed (allergic reaction). Reported on 10/27/2015    . esomeprazole (NEXIUM) 20 MG capsule Take 40 mg by mouth daily at 12 noon.    . fluticasone (FLONASE) 50 MCG/ACT nasal spray TAKE 2 PUFFS IN EACH NOSTRIL EVERY DAY 16 g 3  . furosemide (LASIX) 40 MG tablet Take 0.5 tablets (20 mg total) by mouth every other day. 30 tablet 3  . Iodoquinol-HC-Aloe Polysacch (ALCORTIN A) 1-2-1 % GEL Apply 1 application topically daily.  3  . isosorbide mononitrate (IMDUR) 30 MG 24 hr tablet TAKE ONE TABLET BY MOUTH EVERY DAY 30 tablet 3  . levothyroxine (SYNTHROID, LEVOTHROID) 50 MCG tablet 50 mcg daily before breakfast.     . losartan (COZAAR) 50 MG tablet Take 1 tablet (50 mg total) by mouth daily. 30 tablet 3  . mirabegron ER (MYRBETRIQ) 50 MG TB24 tablet Take 50 mg by mouth daily.    . sertraline (ZOLOFT) 50 MG tablet Take 1 tablet (50 mg total) by mouth daily. (Patient taking differently: Take 0.5 mg by mouth daily. ) 90 tablet 1  . VENTOLIN HFA 108 (90 Base) MCG/ACT inhaler TAKE 2 PUFFS EVERY 8 HOURS AS NEEDED FORWHEEZING 18 g 3   No current facility-administered medications for this visit.     Allergies:   Pravastatin and Prednisone    Social History:  The patient  reports that he quit smoking about 44 years ago. His smoking use included Cigarettes, Pipe, and Cigars. He has a 40.00 pack-year smoking history. He quit smokeless tobacco use about 44 years ago. His smokeless tobacco use included Chew. He reports that he  drinks about 2.4 oz of alcohol per week . He reports that he does not use drugs.   Family History:  The patient's Family history is unknown by patient.    ROS:  Please see the history of present illness.   Otherwise, review of systems are positive for none.   All other systems are reviewed and negative.    PHYSICAL EXAM: VS:  BP (!) 160/80 (BP Location: Left Arm, Patient Position: Sitting, Cuff Size: Large)   Pulse 60   Ht 5' 6.5" (1.689 m)   Wt 261 lb 4 oz (118.5 kg)   BMI 41.54 kg/m  , BMI Body mass index is 41.54 kg/m. GEN: Well nourished, well developed, in no acute distress  HEENT: normal  Neck: no JVD, carotid bruits, or masses Cardiac: RRR; no murmurs, rubs, or gallops,no edema  Respiratory:  clear to  auscultation bilaterally, normal work of breathing GI: soft, nontender, nondistended, + BS MS: no deformity or atrophy  Skin: warm and dry, no rash Neuro:  Strength and sensation are intact Psych: euthymic mood, full affect  EKG:  EKG is ordered today. The ekg ordered today demonstrates normal sinus rhythm with left axis deviation, lateral T wave changes suggestive of ischemia.  Recent Labs: 10/27/2016: TSH 3.31 11/29/2016: Hemoglobin 14.9; Platelets 120 02/08/2017: BUN 20; Creatinine, Ser 1.13; Potassium 4.0; Sodium 139    Lipid Panel    Component Value Date/Time   CHOL 183 10/29/2016 0336   TRIG 229 (H) 10/29/2016 0336   HDL 45 10/29/2016 0336   CHOLHDL 4.1 10/29/2016 0336   VLDL 46 (H) 10/29/2016 0336   LDLCALC 92 10/29/2016 0336      Wt Readings from Last 3 Encounters:  02/14/17 261 lb 4 oz (118.5 kg)  02/08/17 262 lb 12.8 oz (119.2 kg)  02/01/17 261 lb 8 oz (118.6 kg)       No flowsheet data found.    ASSESSMENT AND PLAN:  1.coronary artery disease involving native coronary arteries with other forms of angina: Continues to have exertional dyspnea which is likely multifactorial due to residual coronary artery disease, chronic diastolic heart failure  physical deconditioning.  He has residual disease affecting the ramus artery. PCI should be left as a last resort given the moderate left main stenosis and bifurcation with the left circumflex. I increased Imdur to 60 mg once daily.  We need to control his blood pressure better. Continue treatment with Plavix without aspirin given that he is on long-term anticoagulation with Eliquis  2. Atrial fibrillation: Maintaining in sinus rhythm with amiodarone. Continue anticoagulation with Eliquis.  3. Essential hypertension: Blood pressure continues to be elevated.  I increased losartan to 100 mg once daily.  Imdur was also increased as outlined above.  Check basic metabolic profile in 1 week.  4. Hyperlipidemia: He stopped taking atorvastatin due to myalgia.  I elected to start him on rosuvastatin 10 mg daily.  He will need follow-up lipid and liver profile in 3 months with his follow-up.    Disposition:   FU with me in 3 months  Signed,  [name], MD  02/14/2017 2:08 PM    [facility]

## 2017-02-24 ENCOUNTER — Other Ambulatory Visit
Admission: RE | Admit: 2017-02-24 | Discharge: 2017-02-24 | Disposition: A | Discharge status: Home / Self Care | Payer: PPO | Source: Ambulatory Visit | Attending: Cardiovascular Disease | Admitting: Cardiovascular Disease

## 2017-02-24 DIAGNOSIS — I5030 Unspecified diastolic (congestive) heart failure: Secondary | ICD-10-CM | POA: Diagnosis not present

## 2017-02-24 LAB — BASIC METABOLIC PANEL
Anion gap: 10 (ref 5–15)
BUN: 18 mg/dL (ref 6–20)
CO2: 26 mmol/L (ref 22–32)
Calcium: 8.8 mg/dL — ABNORMAL LOW (ref 8.9–10.3)
Chloride: 101 mmol/L (ref 101–111)
Creatinine, Ser: 1.09 mg/dL (ref 0.61–1.24)
GFR calc Af Amer: >60 mL/min (ref >60)
GFR calc non Af Amer: >60 mL/min (ref >60)
Glucose, Bld: 135 mg/dL — ABNORMAL HIGH (ref 65–99)
Potassium: 3.9 mmol/L (ref 3.5–5.1)
Sodium: 137 mmol/L (ref 135–145)

## 2017-02-27 ENCOUNTER — Other Ambulatory Visit: Payer: Self-pay | Admitting: Internal Medicine

## 2017-02-27 DIAGNOSIS — D751 Secondary polycythemia: Secondary | ICD-10-CM

## 2017-03-01 ENCOUNTER — Inpatient Hospital Stay: Payer: PPO

## 2017-03-01 ENCOUNTER — Inpatient Hospital Stay (HOSPITAL_BASED_OUTPATIENT_CLINIC_OR_DEPARTMENT_OTHER): Payer: PPO | Admitting: Internal Medicine

## 2017-03-01 ENCOUNTER — Other Ambulatory Visit: Payer: Self-pay

## 2017-03-01 ENCOUNTER — Inpatient Hospital Stay: Payer: PPO | Attending: Internal Medicine

## 2017-03-01 VITALS — BP 149/72 | HR 58 | Temp 97.8°F | Resp 20 | Ht 66.5 in | Wt 266.6 lb

## 2017-03-01 DIAGNOSIS — D696 Thrombocytopenia, unspecified: Secondary | ICD-10-CM | POA: Diagnosis not present

## 2017-03-01 DIAGNOSIS — G20 Parkinson's disease: Secondary | ICD-10-CM | POA: Diagnosis not present

## 2017-03-01 DIAGNOSIS — Z79899 Other long term (current) drug therapy: Secondary | ICD-10-CM | POA: Diagnosis not present

## 2017-03-01 DIAGNOSIS — K219 Gastro-esophageal reflux disease without esophagitis: Secondary | ICD-10-CM | POA: Insufficient documentation

## 2017-03-01 DIAGNOSIS — I5032 Chronic diastolic (congestive) heart failure: Secondary | ICD-10-CM | POA: Diagnosis not present

## 2017-03-01 DIAGNOSIS — E785 Hyperlipidemia, unspecified: Secondary | ICD-10-CM | POA: Insufficient documentation

## 2017-03-01 DIAGNOSIS — Z951 Presence of aortocoronary bypass graft: Secondary | ICD-10-CM | POA: Diagnosis not present

## 2017-03-01 DIAGNOSIS — I480 Paroxysmal atrial fibrillation: Secondary | ICD-10-CM | POA: Insufficient documentation

## 2017-03-01 DIAGNOSIS — I110 Hypertensive heart disease with heart failure: Secondary | ICD-10-CM | POA: Diagnosis not present

## 2017-03-01 DIAGNOSIS — F329 Major depressive disorder, single episode, unspecified: Secondary | ICD-10-CM | POA: Insufficient documentation

## 2017-03-01 DIAGNOSIS — Z955 Presence of coronary angioplasty implant and graft: Secondary | ICD-10-CM | POA: Diagnosis not present

## 2017-03-01 DIAGNOSIS — I2510 Atherosclerotic heart disease of native coronary artery without angina pectoris: Secondary | ICD-10-CM | POA: Diagnosis not present

## 2017-03-01 DIAGNOSIS — D751 Secondary polycythemia: Secondary | ICD-10-CM

## 2017-03-01 DIAGNOSIS — G4730 Sleep apnea, unspecified: Secondary | ICD-10-CM | POA: Insufficient documentation

## 2017-03-01 DIAGNOSIS — Z86711 Personal history of pulmonary embolism: Secondary | ICD-10-CM | POA: Diagnosis not present

## 2017-03-01 DIAGNOSIS — Z7901 Long term (current) use of anticoagulants: Secondary | ICD-10-CM | POA: Diagnosis not present

## 2017-03-01 LAB — COMPREHENSIVE METABOLIC PANEL
ALT: 6 U/L — ABNORMAL LOW (ref 17–63)
AST: 18 U/L (ref 15–41)
Albumin: 3.7 g/dL (ref 3.5–5.0)
Alkaline Phosphatase: 100 U/L (ref 38–126)
Anion gap: 6 (ref 5–15)
BUN: 18 mg/dL (ref 6–20)
CO2: 26 mmol/L (ref 22–32)
Calcium: 8.6 mg/dL — ABNORMAL LOW (ref 8.9–10.3)
Chloride: 102 mmol/L (ref 101–111)
Creatinine, Ser: 1.21 mg/dL (ref 0.61–1.24)
GFR calc Af Amer: >60 mL/min (ref >60)
GFR calc non Af Amer: 55 mL/min — ABNORMAL LOW (ref >60)
Glucose, Bld: 111 mg/dL — ABNORMAL HIGH (ref 65–99)
Potassium: 4.2 mmol/L (ref 3.5–5.1)
Sodium: 134 mmol/L — ABNORMAL LOW (ref 135–145)
Total Bilirubin: 0.9 mg/dL (ref 0.3–1.2)
Total Protein: 6.8 g/dL (ref 6.5–8.1)

## 2017-03-01 LAB — CBC WITH DIFFERENTIAL/PLATELET
Basophils Absolute: 0.1 10*3/uL (ref 0–0.1)
Basophils Relative: 1 %
Eosinophils Absolute: 0.1 10*3/uL (ref 0–0.7)
Eosinophils Relative: 1 %
HCT: 44.8 % (ref 40.0–52.0)
Hemoglobin: 14.8 g/dL (ref 13.0–18.0)
Lymphocytes Relative: 18 %
Lymphs Abs: 1.8 10*3/uL (ref 1.0–3.6)
MCH: 30.8 pg (ref 26.0–34.0)
MCHC: 32.9 g/dL (ref 32.0–36.0)
MCV: 93.4 fL (ref 80.0–100.0)
Monocytes Absolute: 0.8 10*3/uL (ref 0.2–1.0)
Monocytes Relative: 8 %
Neutro Abs: 7.1 10*3/uL — ABNORMAL HIGH (ref 1.4–6.5)
Neutrophils Relative %: 72 %
Platelets: 114 10*3/uL — ABNORMAL LOW (ref 150–440)
RBC: 4.8 MIL/uL (ref 4.40–5.90)
RDW: 14.9 % — ABNORMAL HIGH (ref 11.5–14.5)
WBC: 9.9 10*3/uL (ref 3.8–10.6)

## 2017-03-01 NOTE — Progress Notes (Signed)
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 ? Alcohol/fatty liver/? [April 2016];2016- CT/US- NED  # PE [Feb 2011 s/p chole; [facility] in [place], [place] currently off Coumadin; April 2017- Afib- Start eliquis [Dr.Arida]  INTERVAL HISTORY:  81 -year-old male patient with above history of secondary erythrocytosis from testosterone therapy/CPAP and history of A. fib on Elquis here for follow-up.  In the interim patient had a cardiac stent placed. Patient denies any blood in stools black stools. Appetite is fair. He admits to drinking alcohol occasionally weekly basis. He denies any chest pain or shortness of breath or cough.  Denies any strokes.   REVIEW OF SYSTEMS:  A complete 10 point review of system is done which is negative except mentioned above/history of present illness.   PAST MEDICAL HISTORY :  Past Medical History:  Diagnosis Date  . Cervical spondylosis 10/01/2013  . Chronic diastolic CHF (congestive heart failure) ([place])    a. 07/2016 Echo: >55%.  . Coronary artery disease    a. 1998 s/p mini-cabg @ [facility] - pt reports one vessel bypass to LAD;  b. 07/2016 St Echo:  Inadequate HR (max 97) w/ hypertensive response (220/96). Ex time only 2:54 - stopped due to dyspnea and leg pain;  c.  08/2016 MV: EF 67%, no ischemia, low risk.  . Depression   . GERD (gastroesophageal reflux disease)   . Hyperlipidemia   . Hypertension   . PAF (paroxysmal atrial fibrillation) ([facility])    a. s/p DCCV-->maintaining sinus on amiodarone;  b. CHA2DS2VASc = 5-->eliquis.  . Parkinson's disease ([place])    tremors  . Pulmonary embolism ([place]) 2011  . 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 Sleep apnea    wears CPAP     PAST SURGICAL HISTORY :   Past Surgical History:  Procedure Laterality Date  . BACK SURGERY  1960  . CARDIAC CATHETERIZATION    . CHOLECYSTECTOMY  2010  . CORONARY ARTERY BYPASS GRAFT  01/07/1997  . OTHER SURGICAL HISTORY  1998   Bypass    FAMILY HISTORY :   Family History  Family history unknown: Yes    SOCIAL HISTORY:   Social History   Tobacco Use  . Smoking status: Former Smoker    Packs/day: 1.00    Years: 40.00    Pack years: 40.00    Types: Cigarettes, Pipe, Cigars    Last attempt to quit: 04/18/1972    Years since quitting: 44.8  . Smokeless tobacco: Former Systems developer    Types: Chew    Quit date: 04/18/1972  Substance Use Topics  . Alcohol use: Yes    Alcohol/week: 2.4 oz    Types: 2 Cans of beer, 2 Shots of liquor per week    Comment: per 2 weeks   . Drug use: No    ALLERGIES:  is allergic to pravastatin and prednisone.  MEDICATIONS:  Current Outpatient Medications  Medication Sig Dispense Refill  . acetaminophen (TYLENOL) 500 MG tablet Take 500 mg every 6 (six) hours as needed by mouth for mild pain or moderate pain.     [name] amiodarone (PACERONE) 200 MG tablet Take 200 mg by mouth daily.     . carbidopa-levodopa (SINEMET) 25-250 MG tablet Take 2 tablets by mouth 3 (three) times daily. 540 tablet  3  . carvedilol (COREG) 6.25 MG tablet TAKE ONE TABLET BY MOUTH TWICE DAILY WITH A MEAL 60 tablet 3  . clopidogrel (PLAVIX) 75 MG tablet TAKE ONE TABLET BY MOUTH EVERY DAY WITH BREAKFAST 30 tablet 3  . ELIQUIS 5 MG TABS tablet TAKE ONE TABLET TWICE DAILY 60 tablet 6  . esomeprazole (NEXIUM) 20 MG capsule Take 40 mg by mouth daily at 12 noon.    . furosemide (LASIX) 40 MG tablet Take 0.5 tablets (20 mg total) by mouth every other day. 30 tablet 3  . Iodoquinol-HC-Aloe Polysacch (ALCORTIN A) 1-2-1 % GEL Apply 1 application topically daily.  3  . isosorbide mononitrate (IMDUR) 60 MG 24 hr tablet Take 1 tablet (60 mg total) by mouth daily. 90 tablet 3  . levothyroxine  (SYNTHROID, LEVOTHROID) 50 MCG tablet 50 mcg daily before breakfast.     . losartan (COZAAR) 100 MG tablet Take 1 tablet (100 mg total) by mouth daily. 90 tablet 3  . mirabegron ER (MYRBETRIQ) 50 MG TB24 tablet Take 50 mg by mouth daily.    . rosuvastatin (CRESTOR) 10 MG tablet Take 1 tablet (10 mg total) by mouth daily. 90 tablet 3  . sertraline (ZOLOFT) 50 MG tablet Take 1 tablet (50 mg total) by mouth daily. (Patient taking differently: Take 0.5 mg by mouth daily. ) 90 tablet 1  . cyclobenzaprine (FLEXERIL) 5 MG tablet Take 5 mg by mouth daily.    [name] EPINEPHrine 0.3 mg/0.3 mL IJ SOAJ injection Inject 0.3 mg as directed once as needed (allergic reaction). Reported on 10/27/2015    . fluticasone (FLONASE) 50 MCG/ACT nasal spray TAKE 2 PUFFS IN EACH NOSTRIL EVERY DAY (Patient not taking: Reported on 03/01/2017) 16 g 3  . VENTOLIN HFA 108 (90 Base) MCG/ACT inhaler TAKE 2 PUFFS EVERY 8 HOURS AS NEEDED FORWHEEZING (Patient not taking: Reported on 03/01/2017) 18 g 3   No current facility-administered medications for this visit.     PHYSICAL EXAMINATION:   BP (!) 149/72 (BP Location: Left Arm, Patient Position: Sitting)   Pulse (!) 58   Temp 97.8 F (36.6 C) (Tympanic)   Resp 20   Ht 5' 6.5" (1.689 m)   Wt 266 lb 9.6 oz (120.9 kg)   BMI 42.39 kg/m   Filed Weights   03/01/17 1353  Weight: 266 lb 9.6 oz (120.9 kg)    GENERAL: Well-nourished well-developed; Alert, no distress and comfortable. Obese. Accompanied by his wife. EYES: no pallor or icterus OROPHARYNX: no thrush or ulceration NECK: supple, no masses felt LYMPH:  no palpable lymphadenopathy in the cervical, axillary or inguinal regions LUNGS: clear to auscultation and  No wheeze or crackles HEART/CVS: regular rate & rhythm and no murmurs; No lower extremity edema ABDOMEN:abdomen soft, non-tender and normal bowel sounds Musculoskeletal:no cyanosis of digits and no clubbing  PSYCH: alert & oriented x 3 with fluent speech NEURO:  no focal motor/sensory deficits SKIN:  no rashes or significant lesions  LABORATORY DATA:  I have reviewed the data as listed    Component Value Date/Time   NA 134 (L) 03/01/2017 1330   NA 139 01/19/2016 0928   K 4.2 03/01/2017 1330   CL 102 03/01/2017 1330   CO2 26 03/01/2017 1330   GLUCOSE 111 (H) 03/01/2017 1330   BUN 18 03/01/2017 1330   BUN 20 01/19/2016 0928   CREATININE 1.21 03/01/2017 1330   CREATININE 0.95 12/06/2011 1554   CALCIUM 8.6 (L) 03/01/2017 1330   PROT 6.8 03/01/2017 1330  PROT 7.0 12/03/2015 0842   PROT 7.6 09/20/2011 1529   ALBUMIN 3.7 03/01/2017 1330   ALBUMIN 4.2 12/03/2015 0842   ALBUMIN 3.7 09/20/2011 1529   AST 18 03/01/2017 1330   AST 18 09/20/2011 1529   ALT 6 (L) 03/01/2017 1330   ALT 23 09/20/2011 1529   ALKPHOS 100 03/01/2017 1330   ALKPHOS 133 09/20/2011 1529   BILITOT 0.9 03/01/2017 1330   BILITOT 0.8 12/03/2015 0842   BILITOT 0.5 09/20/2011 1529   GFRNONAA 55 (L) 03/01/2017 1330   GFRNONAA >60 12/06/2011 1554   GFRAA >60 03/01/2017 1330   GFRAA >60 12/06/2011 1554    No results found for: SPEP, UPEP  Lab Results  Component Value Date   WBC 9.9 03/01/2017   NEUTROABS 7.1 (H) 03/01/2017   HGB 14.8 03/01/2017   HCT 44.8 03/01/2017   MCV 93.4 03/01/2017   PLT 114 (L) 03/01/2017      Chemistry      Component Value Date/Time   NA 134 (L) 03/01/2017 1330   NA 139 01/19/2016 0928   K 4.2 03/01/2017 1330   CL 102 03/01/2017 1330   CO2 26 03/01/2017 1330   BUN 18 03/01/2017 1330   BUN 20 01/19/2016 0928   CREATININE 1.21 03/01/2017 1330   CREATININE 0.95 12/06/2011 1554      Component Value Date/Time   CALCIUM 8.6 (L) 03/01/2017 1330   ALKPHOS 100 03/01/2017 1330   ALKPHOS 133 09/20/2011 1529   AST 18 03/01/2017 1330   AST 18 09/20/2011 1529   ALT 6 (L) 03/01/2017 1330   ALT 23 09/20/2011 1529   BILITOT 0.9 03/01/2017 1330   BILITOT 0.8 12/03/2015 0842   BILITOT 0.5 09/20/2011 1529        ASSESSMENT & PLAN:    Erythrocytosis # 2011- 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   # Today hemoglobin is 15.5 hematocrit 44. Patient in general did not feel any different after his phlebotomies. I would not recommend any further phlebotomies unless his hematocrit is greater than 52.  # Mild thrombocytopenia platelets 113-   question secondary to alcohol versus liver disease.vs ITP. It's okay from hematology standpoint to be on Eliquis- as his platelets are above 100,000.  #CAD/-stenting [Dr.Arida] A. Fib- on Eliquis per cardiology/C plan above.   # cbcn 3 months/ phlebotomy; follow up with MD/labs in 12 months.      Cammie Sickle, MD 03/01/2017 2:39 PM

## 2017-03-01 NOTE — Assessment & Plan Note (Signed)
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   # Today hemoglobin is 15.5 hematocrit 44. Patient in general did not feel any different after his phlebotomies. I would not recommend any further phlebotomies unless his hematocrit is greater than 52.  # Mild thrombocytopenia platelets 113-   question secondary to alcohol versus liver disease.vs ITP. It's okay from hematology standpoint to be on Eliquis- as his platelets are above 100,000.  #CAD/-stenting [Dr.Arida] A. Fib- on Eliquis per cardiology/C plan above.   # cbcn 3 months/ phlebotomy; follow up with MD/labs in 12 months.

## 2017-03-06 ENCOUNTER — Other Ambulatory Visit: Payer: Self-pay | Admitting: Cardiovascular Disease

## 2017-03-06 NOTE — Progress Notes (Signed)
Exercise review

## 2017-03-27 ENCOUNTER — Telehealth: Payer: Self-pay | Admitting: Cardiovascular Disease

## 2017-03-27 NOTE — Telephone Encounter (Signed)
Pt c/o Shortness Of Breath: STAT if SOB developed within the last 24 hours or pt is noticeably SOB on the phone  1. Are you currently SOB (can you hear that pt is SOB on the phone)? no  2. How long have you been experiencing SOB? About 2-3 weeks after his stent, stent was placed in July.  3. Are you SOB when sitting or when up moving around? It comes and goes , has to breathe hard when moving around  4. Are you currently experiencing any other symptoms? States his legs were hurting around his shin bone that started about a month ago. Denies any swelling, or discoloration.  States sometimes he has some dizziness. States he cannot speak 10 words with out having to take a breath.

## 2017-03-29 ENCOUNTER — Telehealth: Payer: Self-pay | Admitting: Neurology

## 2017-03-29 MED ORDER — ENTACAPONE 200 MG PO TABS: 200.0000 mg | ORAL_TABLET | Freq: Three times a day (TID) | ORAL | 3 refills | Status: DC

## 2017-03-29 NOTE — Telephone Encounter (Signed)
Pt's wife called said he gets shortness of breath when carbidopa-levodopa (SINEMET) 25-250 MG tablet starts to wear off in between doses. He is wanting to know if Dr Jannifer Franklin would prescribe catechol-O-methyltransferase inhibitor, another name is entacaopone. Please call to advise. Pharmacy: [facility]/[place]

## 2017-03-29 NOTE — Telephone Encounter (Signed)
Pt reports continued SOB as it was since before his July hospital admission for NSTEMI and PCI. No new symptoms. He denies chest, arm, or jaw pain. Pt has an appt tomorrow with Dr. Fletcher Anon and states he will discuss further with him at that time. Reviewed Ss that would require immediate attention. Pt verbalized understanding.

## 2017-03-29 NOTE — Telephone Encounter (Signed)
The patient does complain of a tightness that developed in the chest as the Sinemet wears off, this goes away 30 minutes after he takes his medications.  This could be a sensation related to wearing off effect of the Sinemet, we will try the Comtan to see if this helps.

## 2017-03-30 ENCOUNTER — Encounter: Payer: Self-pay | Admitting: Cardiovascular Disease

## 2017-03-30 ENCOUNTER — Inpatient Hospital Stay: Payer: PPO

## 2017-03-30 ENCOUNTER — Ambulatory Visit: Payer: PPO | Admitting: Nurse Practitioner

## 2017-03-30 VITALS — BP 180/84 | HR 64 | Ht 66.0 in | Wt 267.0 lb

## 2017-03-30 DIAGNOSIS — I5033 Acute on chronic diastolic (congestive) heart failure: Secondary | ICD-10-CM | POA: Diagnosis not present

## 2017-03-30 DIAGNOSIS — I10 Essential (primary) hypertension: Secondary | ICD-10-CM | POA: Diagnosis not present

## 2017-03-30 DIAGNOSIS — E785 Hyperlipidemia, unspecified: Secondary | ICD-10-CM | POA: Diagnosis not present

## 2017-03-30 DIAGNOSIS — I2510 Atherosclerotic heart disease of native coronary artery without angina pectoris: Secondary | ICD-10-CM

## 2017-03-30 NOTE — Progress Notes (Signed)
Office Visit    Patient Name: Andre [name] Date of Encounter: 03/30/2017  Primary Care Provider:  [name], MD Primary Cardiologist:  [name], MD  Chief Complaint    81 year old male with a history of coronary artery disease status post CABG and more recent right coronary artery stenting, chronic diastolic congestive heart failure, obesity, hypertension, hyperlipidemia, paroxysmal atrial fibrillation, sleep apnea, and pulmonary embolism in 2011, who presents for follow-up related to dyspnea on exertion.  Past Medical History    Past Medical History:  Diagnosis Date  . Cervical spondylosis 10/01/2013  . Chronic diastolic CHF (congestive heart failure) ([place])    a. 07/2016 Echo: >55%; b. 10/2016 Echo: EF 55-60%, Gr1 DD, Ao sclerosis w/o stenosis, sev dil LA.  [name] Coronary artery disease    a. 1998 s/p mini-cabg @ [facility] - LIMA->LAD;  b. 07/2016 St Echo:  Inadequate HR (max 97) w/ hypertensive response (220/96). Ex time only 2:54 - stopped due to dyspnea and leg pain;  c.  08/2016 MV: EF 67%, no ischemia, low risk; d. 10/2016 NSTEMI/Cath: LM 40, LAD 100ost, RI 80, LCX nl, RCA 95p (4.0x26 Onyx DES), 13m, LIMA->LAD nl, EF 50-55%.  . Depression   . GERD (gastroesophageal reflux disease)   . Hyperlipidemia   . Hypertension   . PAF (paroxysmal atrial fibrillation) ([facility])    a. s/p DCCV-->maintaining sinus on amiodarone;  b. CHA2DS2VASc = 5-->eliquis.  . Parkinson's disease ([place])    tremors  . Pulmonary embolism ([place]) 2011  . 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 Sleep apnea    wears CPAP   Past Surgical History:  Procedure Laterality Date  . BACK SURGERY  1960  . CARDIAC CATHETERIZATION    . CHOLECYSTECTOMY  2010  . CORONARY ARTERY BYPASS GRAFT  01/07/1997  . CORONARY STENT INTERVENTION N/A 10/31/2016   Procedure: Coronary Stent Intervention;  Surgeon: Wellington Hampshire, MD;  Location: [place];  Service: Cardiovascular;  Laterality: N/A;  .  ELECTROPHYSIOLOGIC STUDY N/A 07/14/2015   Procedure: CARDIOVERSION;  Surgeon: Yolonda Kida, MD;  Location: [facility];  Service: Cardiovascular;  Laterality: N/A;  . ELECTROPHYSIOLOGIC STUDY N/A 10/12/2015   Procedure: CARDIOVERSION;  Surgeon: Minna Merritts, MD;  Location: [facility];  Service: Cardiovascular;  Laterality: N/A;  . LEFT HEART CATH AND CORONARY ANGIOGRAPHY N/A 10/31/2016   Procedure: Left Heart Cath and Coronary Angiography;  Surgeon: Wellington Hampshire, MD;  Location: [facility];  Service: Cardiovascular;  Laterality: N/A;  . OTHER SURGICAL HISTORY  1998   Bypass    Allergies  Allergies  Allergen Reactions  . Pravastatin Other (See Comments)  . Prednisone Other (See Comments)    Pt states that med makes him hyper Pt states that med makes him hyper    History of Present Illness    81 year old male with the above complex past medical history including coronary artery disease status post remote coronary artery bypass graft x1 in 1998, pulmonary embolism in 2011, paroxysmal atrial fibrillation on chronic amiodarone and Eliquis, hypertension, hyperlipidemia, depression, GERD, obesity, sleep apnea, and chronic dyspnea on exertion.  I saw her earlier this year, in April with dyspnea and mild volume overload in the setting of noncompliance with Lasix.  I had him resume his Lasix on a daily basis and upon follow-up in early May, he was feeling somewhat better and had some reduction in weight.  He also underwent echo and stress testing which showed normal LV function and no evidence  of ischemia.  Unfortunately, he suffered a non-ST segment elevation myocardial infarction in July requiring drug-eluting stenting of the right coronary artery.  This also resulted in embolization into a large RV branch which required PTCA with resultant TIMI I flow.  At the time of his non-STEMI, he had left arm pain.    He says that following his stent, he felt that his dyspnea improved some for  about 3-4 weeks but then returned to its previous level.  He has been seen multiple times in clinic with complaints of stable dyspnea on exertion, the last of which was in October.  At that time, his isosorbide was titrated.  Patient says he has not noticed much difference in symptoms with titration of isosorbide.  He says that overall, his dyspnea on exertion is stable dating back to sometime in August.  It is frustrating for him however.  He experiences dyspnea with walking and also with bending over.  He has not had any chest or arm pain.  He says he is trying to lose weight but does not do so they weigh himself on a regular basis.  He and his wife are careful with salt.  He has prescribed Lasix 20 mg every other day but typically only takes this once or twice a week as he does not like to take it if he has to be out of the house.  He uses CPAP at night and denies PND or orthopnea.  Further, no dizziness, syncope, or early satiety.  He does note occasional lower extremity swelling thinks this is been stable.  He does feel as though his abdomen is firmer than usual.  Today, his weight is up 6 pounds since October.  Home Medications    Prior to Admission medications   Medication Sig Start Date End Date Taking? Authorizing Provider  acetaminophen (TYLENOL) 500 MG tablet Take 500 mg every 6 (six) hours as needed by mouth for mild pain or moderate pain.    Yes [provider]  amiodarone (PACERONE) 200 MG tablet Take 200 mg by mouth daily.  07/30/15  Yes [provider]  carbidopa-levodopa (SINEMET) 25-250 MG tablet Take 2 tablets by mouth 3 (three) times daily. 02/01/17  Yes Kathrynn Ducking, MD  carvedilol (COREG) 6.25 MG tablet TAKE ONE TABLET BY MOUTH TWICE DAILY WITH A MEAL 03/06/17  Yes Wellington Hampshire, MD  clopidogrel (PLAVIX) 75 MG tablet TAKE ONE TABLET BY MOUTH EVERY DAY WITH BREAKFAST 03/06/17  Yes Wellington Hampshire, MD  ELIQUIS 5 MG TABS tablet TAKE ONE TABLET TWICE DAILY  02/06/17  Yes Wellington Hampshire, MD  EPINEPHrine 0.3 mg/0.3 mL IJ SOAJ injection Inject 0.3 mg as directed once as needed (allergic reaction). Reported on 10/27/2015 07/01/13  Yes [provider]  esomeprazole (NEXIUM) 20 MG capsule Take 40 mg by mouth daily at 12 noon.   Yes [provider]  fluticasone (FLONASE) 50 MCG/ACT nasal spray TAKE 2 PUFFS IN EACH NOSTRIL EVERY DAY 10/18/16  Yes Cook, Jayce G, DO  furosemide (LASIX) 40 MG tablet Take 0.5 tablets (20 mg total) by mouth every other day. 01/26/17  Yes Deboraha Sprang, MD  Iodoquinol-HC-Aloe Polysacch (ALCORTIN A) 1-2-1 % GEL Apply 1 application topically daily. 12/08/16  Yes [provider]  isosorbide mononitrate (IMDUR) 60 MG 24 hr tablet Take 1 tablet (60 mg total) by mouth daily. 02/14/17  Yes Wellington Hampshire, MD  levothyroxine (SYNTHROID, LEVOTHROID) 50 MCG tablet 50 mcg daily before breakfast.  09/07/16  Yes [provider]  losartan (COZAAR) 100 MG tablet Take 1 tablet (100 mg total) by mouth daily. 02/14/17  Yes Wellington Hampshire, MD  rosuvastatin (CRESTOR) 10 MG tablet Take 1 tablet (10 mg total) by mouth daily. 02/14/17 05/15/17 Yes Wellington Hampshire, MD  sertraline (ZOLOFT) 50 MG tablet Take 1 tablet (50 mg total) by mouth daily. Patient taking differently: Take 0.5 mg by mouth daily.  01/04/17  Yes Cook, Jayce G, DO  VENTOLIN HFA 108 (90 Base) MCG/ACT inhaler TAKE 2 PUFFS EVERY 8 HOURS AS NEEDED FORWHEEZING 12/02/16  Yes Cook, Jayce G, DO  entacapone (COMTAN) 200 MG tablet Take 1 tablet (200 mg total) by mouth 3 (three) times daily. Patient not taking: Reported on 03/30/2017 03/29/17   Kathrynn Ducking, MD    Review of Systems    Chronic, stable dyspnea on exertion, intermittent lower extremity edema, increasing abdominal girth.  No chest or arm pain.  No palpitations, PND, orthopnea, dizziness, syncope, or early satiety.  All other systems reviewed and are otherwise negative except as noted  above.  Physical Exam    VS:  BP (!) 180/84 (BP Location: Left Arm, Patient Position: Sitting, Cuff Size: Normal)   Pulse 64   Ht 5\' 6"  (1.676 m)   Wt 267 lb (121.1 kg)   BMI 43.09 kg/m  , BMI Body mass index is 43.09 kg/m.  Blood pressure repeated and confirmed at 180/90. GEN: Obese, in no acute distress.  HEENT: normal.  Neck: Supple, JVP approximately 12 cm.  No carotid bruits, or masses. Cardiac: RRR, 2/6 systolic ejection murmur at the upper sternal border, no rubs, or gallops. No clubbing, cyanosis.  1+ bilateral lower extremity edema.  Radials/DP/PT 2+ and equal bilaterally.  Respiratory:  Respirations regular and unlabored, bibasilar crackles, otherwise clear to auscultation.[name] GI: Obese, protuberant, semi-firm, BS + x 4. MS: no deformity or atrophy. Skin: warm and dry, no rash. Neuro:  Strength and sensation are intact. Psych: Normal affect.  Accessory Clinical Findings    ECG -regular sinus rhythm, 64, first-degree AV block, left axis deviation, nonspecific ST and T changes  Assessment & Plan    1.  Acute on chronic diastolic congestive heart failure: Patient with a history of normal LV function and grade 1 diastolic dysfunction.  He is prescribed Lasix at home but uses it sparingly to avoid the burden of frequent urination.  He has some degree of chronic dyspnea which previously responded to increased diuretic dosing/compliance in the spring.  He has not been having any chest or arm (anginal equivalent) pain.  Though his dyspnea has been stable, and has been quite bothersome.  He is up 6 pounds since his last visit with evidence of volume overload including crackles, JVD, increased abdominal girth, and lower extremity edema.  He is also hypertensive.  He has not yet taken his medications which include carvedilol, losartan, and isosorbide.  I did encourage him to go home and take his medicines and have also asked him to take Lasix 20 mg daily over the next week with a plan to  see him back in 1 week and reevaluate.  He agreed to this.  2.  Coronary artery disease: Status post remote CABG x1 with subsequent non-STEMI and stenting of the right coronary artery.  He is known to have residual ramus intermedius disease with PCI of the ramus would be complex in the setting of moderate left main stenosis and bifurcation with the left circumflex.  Of  note, he has not had any left arm pain, which was his primary symptom when presenting with his non-STEMI.  If symptoms do not improve despite volume optimization, we will need to reconsider the diagnostic catheterization.  Continue beta-blocker, Plavix, ARB, nitrate, and statin therapy.  No aspirin in the setting of Eliquis therapy.  3.  Essential hypertension: Blood pressure markedly elevated today.  He has not yet taken his morning medicines.  He says his blood pressure typically runs in the 120s-130s at home.  I have encouraged him to go home and take his medicines.  Also, as above, he will be taking Lasix 20 mg daily for the next week until I see him back.  4.  Hyperlipidemia: LDL was 92 in July.  He was placed on Crestor more recently in the setting of intolerance of Lipitor.  I will plan to follow-up labs when I see him next week.  5.  Paroxysmal atrial fibrillation: In sinus rhythm and on amiodarone and beta-blocker.  Following volume optimization, given chronic dyspnea, we should look to repeat the pulmonary function tests in the setting of chronic amiodarone therapy.  These were last checked in 2014.  6.  Disposition: Follow-up in 1 week.   Murray Hodgkins, NP 03/30/2017, 4:06 PM

## 2017-03-30 NOTE — Patient Instructions (Signed)
Medication Instructions: - Your physician has recommended you make the following change in your medication:  1) Take lasix (furosemide) 40 mg- 1/2 tablet (20 mg) once daily until you are seen in the office next week.  Labwork: - none ordered  Procedures/Testing: - none ordered  Follow-Up: - Your physician recommends that you schedule a follow-up appointment in: 1 week with Ignacia Bayley, NP  ** Thursday 04/06/17 at 1:30 pm **    Any Additional Special Instructions Will Be Listed Below (If Applicable).     If you need a refill on your cardiac medications before your next appointment, please call your pharmacy.

## 2017-04-03 ENCOUNTER — Ambulatory Visit: Payer: PPO | Admitting: Physician Assistant

## 2017-04-06 ENCOUNTER — Ambulatory Visit: Payer: PPO | Admitting: Nurse Practitioner

## 2017-04-06 ENCOUNTER — Telehealth: Payer: Self-pay | Admitting: Nurse Practitioner

## 2017-04-06 ENCOUNTER — Encounter: Payer: Self-pay | Admitting: Nurse Practitioner

## 2017-04-06 VITALS — BP 140/60 | HR 88 | Ht 66.0 in | Wt 265.0 lb

## 2017-04-06 DIAGNOSIS — I480 Paroxysmal atrial fibrillation: Secondary | ICD-10-CM

## 2017-04-06 DIAGNOSIS — E782 Mixed hyperlipidemia: Secondary | ICD-10-CM

## 2017-04-06 DIAGNOSIS — I10 Essential (primary) hypertension: Secondary | ICD-10-CM

## 2017-04-06 DIAGNOSIS — I5030 Unspecified diastolic (congestive) heart failure: Secondary | ICD-10-CM | POA: Diagnosis not present

## 2017-04-06 DIAGNOSIS — I5033 Acute on chronic diastolic (congestive) heart failure: Secondary | ICD-10-CM | POA: Diagnosis not present

## 2017-04-06 DIAGNOSIS — I2510 Atherosclerotic heart disease of native coronary artery without angina pectoris: Secondary | ICD-10-CM | POA: Diagnosis not present

## 2017-04-06 MED ORDER — FUROSEMIDE 20 MG PO TABS: 20.0000 mg | ORAL_TABLET | Freq: Every day | ORAL | 3 refills | Status: DC

## 2017-04-06 NOTE — Progress Notes (Signed)
Office Visit    Patient Name: Andre [name] Date of Encounter: 04/06/2017  Primary Care Provider:  [name], MD Primary Cardiologist:  [name], MD / EP: Olin Pia, MD   Chief Complaint    81 year old male with a history of CAD status post CABG and more recent right coronary artery stenting, chronic diastolic congestive heart failure, obesity, hypertension, hyperlipidemia, paroxysmal atrial fibrillation on Eliquis, sleep apnea, and history of pulmonary embolism in 2011, who presents for follow-up related to volume overload and dyspnea.  Past Medical History    Past Medical History:  Diagnosis Date  . Cervical spondylosis 10/01/2013  . Chronic diastolic CHF (congestive heart failure) ([place])    a. 07/2016 Echo: >55%; b. 10/2016 Echo: EF 55-60%, Gr1 DD, Ao sclerosis w/o stenosis, sev dil LA.  [name] Coronary artery disease    a. 1998 s/p mini-cabg @ [facility] - LIMA->LAD;  b. 07/2016 St Echo:  Inadequate HR (max 97) w/ hypertensive response (220/96). Ex time only 2:54 - stopped due to dyspnea and leg pain;  c.  08/2016 MV: EF 67%, no ischemia, low risk; d. 10/2016 NSTEMI/Cath: LM 40, LAD 100ost, RI 80, LCX nl, RCA 95p (4.0x26 Onyx DES), 33m, LIMA->LAD nl, EF 50-55%.  . Depression   . GERD (gastroesophageal reflux disease)   . Hyperlipidemia   . Hypertension   . PAF (paroxysmal atrial fibrillation) ([facility])    a. s/p DCCV-->maintaining sinus on amiodarone;  b. CHA2DS2VASc = 5-->eliquis.  . Parkinson's disease ([place])    tremors  . Pulmonary embolism ([place]) 2011  . 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 Sleep apnea    wears CPAP   Past Surgical History:  Procedure Laterality Date  . BACK SURGERY  1960  . CARDIAC CATHETERIZATION    . CHOLECYSTECTOMY  2010  . CORONARY ARTERY BYPASS GRAFT  01/07/1997  . CORONARY STENT INTERVENTION N/A 10/31/2016   Procedure: Coronary Stent Intervention;  Surgeon: Wellington Hampshire, MD;  Location: [facility];  Service: Cardiovascular;   Laterality: N/A;  . ELECTROPHYSIOLOGIC STUDY N/A 07/14/2015   Procedure: CARDIOVERSION;  Surgeon: Yolonda Kida, MD;  Location: [facility];  Service: Cardiovascular;  Laterality: N/A;  . ELECTROPHYSIOLOGIC STUDY N/A 10/12/2015   Procedure: CARDIOVERSION;  Surgeon: Minna Merritts, MD;  Location: [facility];  Service: Cardiovascular;  Laterality: N/A;  . LEFT HEART CATH AND CORONARY ANGIOGRAPHY N/A 10/31/2016   Procedure: Left Heart Cath and Coronary Angiography;  Surgeon: Wellington Hampshire, MD;  Location: [facility];  Service: Cardiovascular;  Laterality: N/A;  . OTHER SURGICAL HISTORY  1998   Bypass    Allergies  Allergies  Allergen Reactions  . Pravastatin Other (See Comments)  . Prednisone Other (See Comments)    Pt states that med makes him hyper Pt states that med makes him hyper    History of Present Illness    81 year old male with the above complex past medical history including CAD status post remote CABG x1 in 1998, PE in 2011, PAF on chronic amiodarone and Eliquis, hypertension, hyperlipidemia, depression, GERD, obesity, sleep apnea, and chronic dyspnea on exertion.  In July of this year he was hospitalized with left arm pain and ruled in for a non-STEMI.  Catheterization was performed and he required drug-eluting stent placement to the right coronary artery.  This resulted in embolization into a large RV branch which required PTCA with resultant TIMI I flow.  I saw him in clinic last week and he reported progressive dyspnea  on exertion, lower extremity swelling, and increasing abdominal girth in the setting of Lasix noncompliance.  He was volume overloaded on exam and I asked him to take Lasix 20 mg daily.  Since then, he has had a 2 pound weight loss on our scale, though he notes a significant improvement in dyspnea, edema, and abdominal bloating.  He still has dyspnea on exertion but overall this is improved.  He denies chest pain, palpitations, PND, orthopnea, dizziness,  syncope, or early satiety.  Home Medications    Prior to Admission medications   Medication Sig Start Date End Date Taking? Authorizing Provider  acetaminophen (TYLENOL) 500 MG tablet Take 500 mg every 6 (six) hours as needed by mouth for mild pain or moderate pain.    Yes [provider]  amiodarone (PACERONE) 200 MG tablet Take 200 mg by mouth daily.  07/30/15  Yes [provider]  carbidopa-levodopa (SINEMET) 25-250 MG tablet Take 2 tablets by mouth 3 (three) times daily. 02/01/17  Yes Kathrynn Ducking, MD  carvedilol (COREG) 6.25 MG tablet TAKE ONE TABLET BY MOUTH TWICE DAILY WITH A MEAL 03/06/17  Yes Wellington Hampshire, MD  clopidogrel (PLAVIX) 75 MG tablet TAKE ONE TABLET BY MOUTH EVERY DAY WITH BREAKFAST 03/06/17  Yes Wellington Hampshire, MD  ELIQUIS 5 MG TABS tablet TAKE ONE TABLET TWICE DAILY 02/06/17  Yes Wellington Hampshire, MD  EPINEPHrine 0.3 mg/0.3 mL IJ SOAJ injection Inject 0.3 mg as directed once as needed (allergic reaction). Reported on 10/27/2015 07/01/13  Yes [provider]  esomeprazole (NEXIUM) 20 MG capsule Take 40 mg by mouth daily at 12 noon.   Yes [provider]  fluticasone (FLONASE) 50 MCG/ACT nasal spray TAKE 2 PUFFS IN EACH NOSTRIL EVERY DAY 10/18/16  Yes Cook, Jayce G, DO  Iodoquinol-HC-Aloe Polysacch (ALCORTIN A) 1-2-1 % GEL Apply 1 application topically daily. 12/08/16  Yes [provider]  isosorbide mononitrate (IMDUR) 60 MG 24 hr tablet Take 1 tablet (60 mg total) by mouth daily. 02/14/17  Yes Wellington Hampshire, MD  levothyroxine (SYNTHROID, LEVOTHROID) 50 MCG tablet 50 mcg daily before breakfast.  09/07/16  Yes [provider]  losartan (COZAAR) 100 MG tablet Take 1 tablet (100 mg total) by mouth daily. 02/14/17  Yes Wellington Hampshire, MD  rosuvastatin (CRESTOR) 10 MG tablet Take 1 tablet (10 mg total) by mouth daily. 02/14/17 05/15/17 Yes Wellington Hampshire, MD  sertraline (ZOLOFT) 50 MG tablet Take 1 tablet (50 mg  total) by mouth daily. Patient taking differently: Take 0.5 mg by mouth daily.  01/04/17  Yes Cook, Jayce G, DO  VENTOLIN HFA 108 (90 Base) MCG/ACT inhaler TAKE 2 PUFFS EVERY 8 HOURS AS NEEDED FORWHEEZING 12/02/16  Yes Cook, Jayce G, DO  entacapone (COMTAN) 200 MG tablet Take 1 tablet (200 mg total) by mouth 3 (three) times daily. Patient not taking: Reported on 04/06/2017 03/29/17   Kathrynn Ducking, MD  furosemide (LASIX) 20 MG tablet Take 1 tablet (20 mg total) by mouth daily. 04/06/17 07/05/17  Theora Gianotti, NP    Review of Systems    Improved dyspnea.  Less swelling and abd bloating.  All other systems reviewed and are otherwise negative except as noted above.  Physical Exam    VS:  BP 140/60 (BP Location: Left Arm, Patient Position: Sitting, Cuff Size: Normal)   Pulse 88   Ht 5\' 6"  (1.676 m)   Wt 265 lb (120.2 kg)   SpO2 92%  BMI 42.77 kg/m  , BMI Body mass index is 42.77 kg/m. GEN: Well nourished, well developed, in no acute distress.  HEENT: normal.  Neck: Supple, no JVD, carotid bruits, or masses. Cardiac: RRR, 2/6 systolic ejection murmur at the upper sternal border, no rubs, or gallops. No clubbing, cyanosis, 1+ bilateral lower extremity edema.  Radials/DP/PT 2+ and equal bilaterally.  Respiratory:  Respirations regular and unlabored, clear to auscultation bilaterally. GI: Protuberant, semi-firm, nontender, BS + x 4. MS: no deformity or atrophy. Skin: warm and dry, no rash. Neuro:  Strength and sensation are intact. Psych: Normal affect.  Accessory Clinical Findings    Basic metabolic panel pending  Assessment & Plan    1.  Acute on chronic diastolic congestive heart failure: Patient was seen 1 week ago with volume overload and dyspnea.  This occurred in the setting of not taking Lasix as prescribed.  I recommended that he take Lasix 20 mg daily and over the past week, he is down 2 pounds with significant improvement in volume status and symptoms.  He  still has mild lower extremity swelling and some increase in abdominal girth.  I have asked him to take 40 mg of Lasix over the next 3 days and then drop back down to a half a tablet daily.  I will follow-up a basic metabolic panel today.  Blood pressure is improved following diuresis.  He remains on beta-blocker, ARB, and nitrate.  2.  CAD: Status post remote CABG x1 with subsequent non-STEMI and stenting the right coronary artery over this past summer.  He has not been having any chest or arm pain.  Arm pain was his most recent anginal equivalent.  He remains on beta-blocker, Plavix, ARB, nitrate, and statin therapy.  He is not on aspirin in the setting of chronic Eliquis.  3.  Essential hypertension: Slightly elevated today.  He says it typically runs in the 120s 130s.  Continue diuresis.  4.  Hyperlipidemia: LDL was 92 in July.  He remains on Lipitor.  5.  Paroxysmal atrial fibrillation: Maintaining sinus rhythm on amiodarone and beta-blocker.  Following volume optimization, it would be wise to follow-up pulmonary function testing in the setting of chronic amiodarone therapy as these were last checked in 2014.  6.  Disposition: Follow-up basic metabolic panel today.  Follow-up in clinic as scheduled in January.   Murray Hodgkins, NP 04/06/2017, 2:03 PM

## 2017-04-06 NOTE — Patient Instructions (Addendum)
Medication Instructions:  Your physician has recommended you make the following change in your medication:  INCREASE lasix to 40mg  once daily FOR THREE DAYS then  DECREASE lasix back to 20mg  once daily.    Labwork: BMET today  Testing/Procedures: none  Follow-Up: Your physician recommends that you keep follow-up appointment with Dr. Fletcher Anon in January.   Any Other Special Instructions Will Be Listed Below (If Applicable).  Samples of Eliquis were given to the patient, quantity 1, Lot Number [id number] exp: 06/2019    If you need a refill on your cardiac medications before your next appointment, please call your pharmacy.

## 2017-04-06 NOTE — Telephone Encounter (Signed)
Pt wife states pt needs 1 more bottle of Eliquis to get pt to January 2. Please call and advise.

## 2017-04-07 LAB — BASIC METABOLIC PANEL
BUN/Creatinine Ratio: 18 (ref 10–24)
BUN: 19 mg/dL (ref 8–27)
CO2: 24 mmol/L (ref 20–29)
Calcium: 9.1 mg/dL (ref 8.6–10.2)
Chloride: 100 mmol/L (ref 96–106)
Creatinine, Ser: 1.03 mg/dL (ref 0.76–1.27)
GFR calc Af Amer: 79 mL/min/{1.73_m2} (ref >59)
GFR calc non Af Amer: 69 mL/min/{1.73_m2} (ref >59)
Glucose: 108 mg/dL — ABNORMAL HIGH (ref 65–99)
Potassium: 4.4 mmol/L (ref 3.5–5.2)
Sodium: 141 mmol/L (ref 134–144)

## 2017-04-07 NOTE — Telephone Encounter (Signed)
Notified wife that one box of Eliquis is at the front desk for pick up. Reviewed medication policy and she is aware that we will be unable to provide samples next year.   Samples of this drug were given to the patient, quantity 1 box, Lot Number [id number]   Exp: 3/21

## 2017-04-10 ENCOUNTER — Other Ambulatory Visit: Payer: Self-pay | Admitting: Family Medicine

## 2017-04-10 NOTE — Telephone Encounter (Signed)
Okay to refill? 

## 2017-04-19 ENCOUNTER — Telehealth: Payer: Self-pay

## 2017-04-19 ENCOUNTER — Telehealth: Payer: Self-pay | Admitting: Neurology

## 2017-04-19 MED ORDER — BUPROPION HCL ER (XL) 150 MG PO TB24: 150.0000 mg | ORAL_TABLET | Freq: Every day | ORAL | 5 refills | Status: DC

## 2017-04-19 NOTE — Telephone Encounter (Signed)
Pt's wife called said he is depressed. She said PCP has started him on sertraline (ZOLOFT) 50 MG tablet but this does not seem to be working. She is wanting to know if there is a depression medication that would maybe work better with parkinson's. She said he just sits on the couch and doesn't care about anything. Please call to advise. She is aware Dr Jannifer Franklin is out of the office this week but she is wanting this taken care of this week

## 2017-04-19 NOTE — Addendum Note (Signed)
Addended by: Hope Pigeon on: 04/19/2017 04:01 PM   Modules accepted: Orders

## 2017-04-19 NOTE — Telephone Encounter (Signed)
Called and spoke with patient/wife. Relayed recommendation per Dr. Felecia Shelling. Patient agreeable to switch. Went over common SE with wife. Verified pharmacy on file. Advised them to call if he experiences any SE.Advised he should try and take for 2-3 weeks for medication to reach max benefit. E-scribed rx to pharmacy.   Called and spoke with at [facility]. Asked them to cancel rx zoloft 50mg  tab on file. Switching pt to Wellbutrin instead. She verbalized understanding.

## 2017-04-19 NOTE — Telephone Encounter (Signed)
Opened in error

## 2017-04-19 NOTE — Telephone Encounter (Signed)
Let's switch zoloft to Wellbutrin XL 150 mg daily   #30 with 5 refills

## 2017-04-25 DIAGNOSIS — N3281 Overactive bladder: Secondary | ICD-10-CM | POA: Diagnosis not present

## 2017-04-25 DIAGNOSIS — N138 Other obstructive and reflux uropathy: Secondary | ICD-10-CM | POA: Diagnosis not present

## 2017-04-25 DIAGNOSIS — N401 Enlarged prostate with lower urinary tract symptoms: Secondary | ICD-10-CM | POA: Diagnosis not present

## 2017-04-25 DIAGNOSIS — R350 Frequency of micturition: Secondary | ICD-10-CM | POA: Diagnosis not present

## 2017-04-25 DIAGNOSIS — R339 Retention of urine, unspecified: Secondary | ICD-10-CM | POA: Diagnosis not present

## 2017-04-25 DIAGNOSIS — N3941 Urge incontinence: Secondary | ICD-10-CM | POA: Diagnosis not present

## 2017-04-27 ENCOUNTER — Other Ambulatory Visit: Payer: PPO

## 2017-04-27 DIAGNOSIS — Z85828 Personal history of other malignant neoplasm of skin: Secondary | ICD-10-CM | POA: Diagnosis not present

## 2017-04-27 DIAGNOSIS — L821 Other seborrheic keratosis: Secondary | ICD-10-CM | POA: Diagnosis not present

## 2017-04-27 DIAGNOSIS — L578 Other skin changes due to chronic exposure to nonionizing radiation: Secondary | ICD-10-CM | POA: Diagnosis not present

## 2017-04-27 DIAGNOSIS — D692 Other nonthrombocytopenic purpura: Secondary | ICD-10-CM | POA: Diagnosis not present

## 2017-04-27 DIAGNOSIS — L820 Inflamed seborrheic keratosis: Secondary | ICD-10-CM | POA: Diagnosis not present

## 2017-04-27 DIAGNOSIS — L570 Actinic keratosis: Secondary | ICD-10-CM | POA: Diagnosis not present

## 2017-05-01 ENCOUNTER — Ambulatory Visit: Payer: PPO | Admitting: Family Medicine

## 2017-05-11 DIAGNOSIS — Z7982 Long term (current) use of aspirin: Secondary | ICD-10-CM | POA: Diagnosis not present

## 2017-05-11 DIAGNOSIS — S0083XA Contusion of other part of head, initial encounter: Secondary | ICD-10-CM | POA: Diagnosis not present

## 2017-05-11 DIAGNOSIS — S199XXA Unspecified injury of neck, initial encounter: Secondary | ICD-10-CM | POA: Diagnosis not present

## 2017-05-11 DIAGNOSIS — Z951 Presence of aortocoronary bypass graft: Secondary | ICD-10-CM | POA: Diagnosis not present

## 2017-05-11 DIAGNOSIS — Z23 Encounter for immunization: Secondary | ICD-10-CM | POA: Diagnosis not present

## 2017-05-11 DIAGNOSIS — R7303 Prediabetes: Secondary | ICD-10-CM | POA: Diagnosis not present

## 2017-05-11 DIAGNOSIS — S51801A Unspecified open wound of right forearm, initial encounter: Secondary | ICD-10-CM | POA: Diagnosis not present

## 2017-05-11 DIAGNOSIS — S0003XA Contusion of scalp, initial encounter: Secondary | ICD-10-CM | POA: Diagnosis not present

## 2017-05-11 DIAGNOSIS — Z87891 Personal history of nicotine dependence: Secondary | ICD-10-CM | POA: Diagnosis not present

## 2017-05-11 DIAGNOSIS — Z86711 Personal history of pulmonary embolism: Secondary | ICD-10-CM | POA: Diagnosis not present

## 2017-05-11 DIAGNOSIS — I10 Essential (primary) hypertension: Secondary | ICD-10-CM | POA: Diagnosis not present

## 2017-05-11 DIAGNOSIS — G20 Parkinson's disease: Secondary | ICD-10-CM | POA: Diagnosis not present

## 2017-05-11 DIAGNOSIS — M47812 Spondylosis without myelopathy or radiculopathy, cervical region: Secondary | ICD-10-CM | POA: Diagnosis not present

## 2017-05-11 DIAGNOSIS — S0191XA Laceration without foreign body of unspecified part of head, initial encounter: Secondary | ICD-10-CM | POA: Diagnosis not present

## 2017-05-11 DIAGNOSIS — S0990XA Unspecified injury of head, initial encounter: Secondary | ICD-10-CM | POA: Diagnosis not present

## 2017-05-11 DIAGNOSIS — Z955 Presence of coronary angioplasty implant and graft: Secondary | ICD-10-CM | POA: Diagnosis not present

## 2017-05-11 DIAGNOSIS — Z7901 Long term (current) use of anticoagulants: Secondary | ICD-10-CM | POA: Diagnosis not present

## 2017-05-11 DIAGNOSIS — I252 Old myocardial infarction: Secondary | ICD-10-CM | POA: Diagnosis not present

## 2017-05-12 DIAGNOSIS — S0191XA Laceration without foreign body of unspecified part of head, initial encounter: Secondary | ICD-10-CM | POA: Diagnosis not present

## 2017-05-12 DIAGNOSIS — S199XXA Unspecified injury of neck, initial encounter: Secondary | ICD-10-CM | POA: Diagnosis not present

## 2017-05-15 ENCOUNTER — Other Ambulatory Visit: Payer: Self-pay

## 2017-05-15 ENCOUNTER — Ambulatory Visit (INDEPENDENT_AMBULATORY_CARE_PROVIDER_SITE_OTHER): Payer: PPO

## 2017-05-15 ENCOUNTER — Ambulatory Visit (INDEPENDENT_AMBULATORY_CARE_PROVIDER_SITE_OTHER): Payer: PPO | Admitting: Family Medicine

## 2017-05-15 ENCOUNTER — Encounter: Payer: Self-pay | Admitting: Family Medicine

## 2017-05-15 VITALS — BP 132/62 | HR 56 | Temp 99.1°F | Resp 18 | Ht 67.0 in | Wt 266.6 lb

## 2017-05-15 DIAGNOSIS — S51011A Laceration without foreign body of right elbow, initial encounter: Secondary | ICD-10-CM

## 2017-05-15 DIAGNOSIS — M25521 Pain in right elbow: Secondary | ICD-10-CM

## 2017-05-15 DIAGNOSIS — S5001XA Contusion of right elbow, initial encounter: Secondary | ICD-10-CM | POA: Diagnosis not present

## 2017-05-15 DIAGNOSIS — M79601 Pain in right arm: Secondary | ICD-10-CM

## 2017-05-15 DIAGNOSIS — S0093XA Contusion of unspecified part of head, initial encounter: Secondary | ICD-10-CM

## 2017-05-15 NOTE — Progress Notes (Signed)
Subjective:  By signing my name below, I, Essence Howell, attest that this documentation has been prepared under the direction and in the presence of Wendie Agreste, MD Electronically Signed: Ladene Artist, ED Scribe 05/15/2017 at 12:43 PM.   Patient ID: Andre [name], male    DOB: [birth year], 82 y.o.   MRN: [id number]  Chief Complaint  Patient presents with  . Fall    fell thursday went to ED follow up on wound care on right arm and head    . Depression    screening was a 6    HPI Andre [name] is a 82 y.o. male who presents to [facility]. New pt to me. H/o multiple medical problems and on blood thinners. Here for f/u of wound. The initial fall was on 1/24. Pt obtained injuries to his head and R arm. He was seen at the ED where he had an overall normal CT head and c-spine. Diagnosed with hematoma and skin tear. Tetanus was updated. Denies worsening HA, blurry vision, nausea, vomiting.  Depression  Positive depression screen. PCP: [name], MD. Is on medication.   Depression screen [place] 2/9 05/15/2017 11/21/2016 09/27/2016  Decreased Interest 1 3 3   Down, Depressed, Hopeless 1 2 3   PHQ - 2 Score 2 5 6   Altered sleeping 0 0 3  Tired, decreased energy 3 2 3   Change in appetite 0 1 3  Feeling bad or failure about yourself  1 3 2   Trouble concentrating 0 0 1  Moving slowly or fidgety/restless 0 0 0  Suicidal thoughts 0 0 1  PHQ-9 Score 6 11 19   Difficult doing work/chores - Not difficult at all -   Patient Active Problem List   Diagnosis Date Noted  . Abrasion 02/08/2017  . Depression 01/05/2017  . Prediabetes 10/27/2016  . Hypothyroidism 10/27/2016  . (HFpEF) heart failure with preserved ejection fraction ([place]) 09/27/2016  . Parkinson's disease ([place]) 06/30/2016  . PAF (paroxysmal atrial fibrillation) ([place])   . BMI 40.0-44.9, adult ([place]) 05/06/2015  . Erythrocytosis 01/28/2015  . Atherosclerosis of abdominal aorta ([place]) 12/08/2014  . Barrett's esophagus  12/31/2013  . DDD (degenerative disc disease), cervical 10/01/2013  . DDD (degenerative disc disease), lumbar 10/01/2013  . Coronary artery disease involving native coronary artery of native heart with angina pectoris ([place]) 10/30/2012  . GERD (gastroesophageal reflux disease) 10/30/2012  . Hyperlipidemia with target LDL less than 70 10/30/2012  . Essential hypertension 10/30/2012  . Allergic rhinitis 09/10/2012  . OSA (obstructive sleep apnea) 09/10/2012   Past Medical History:  Diagnosis Date  . Cervical spondylosis 10/01/2013  . Chronic diastolic CHF (congestive heart failure) ([place])    a. 07/2016 Echo: >55%; b. 10/2016 Echo: EF 55-60%, Gr1 DD, Ao sclerosis w/o stenosis, sev dil LA.  [name] Coronary artery disease    a. 1998 s/p mini-cabg @ [facility] - LIMA->LAD;  b. 07/2016 St Echo:  Inadequate HR (max 97) w/ hypertensive response (220/96). Ex time only 2:54 - stopped due to dyspnea and leg pain;  c.  08/2016 MV: EF 67%, no ischemia, low risk; d. 10/2016 NSTEMI/Cath: LM 40, LAD 100ost, RI 80, LCX nl, RCA 95p (4.0x26 Onyx DES), 41m, LIMA->LAD nl, EF 50-55%.  . Depression   . GERD (gastroesophageal reflux disease)   . Hyperlipidemia   . Hypertension   . PAF (paroxysmal atrial fibrillation) ([facility])    a. s/p DCCV-->maintaining sinus on amiodarone;  b. CHA2DS2VASc = 5-->eliquis.  . Parkinson's disease ([place])  tremors  . Pulmonary embolism ([place]) 2011  . 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 Sleep apnea    wears CPAP   Past Surgical History:  Procedure Laterality Date  . BACK SURGERY  1960  . CARDIAC CATHETERIZATION    . CHOLECYSTECTOMY  2010  . CORONARY ARTERY BYPASS GRAFT  01/07/1997  . CORONARY STENT INTERVENTION N/A 10/31/2016   Procedure: Coronary Stent Intervention;  Surgeon: Wellington Hampshire, MD;  Location: [place] [facility];  Service: Cardiovascular;  Laterality: N/A;  . ELECTROPHYSIOLOGIC STUDY N/A 07/14/2015   Procedure: CARDIOVERSION;  Surgeon: Yolonda Kida, MD;  Location: [facility]  ORS;  Service: Cardiovascular;  Laterality: N/A;  . ELECTROPHYSIOLOGIC STUDY N/A 10/12/2015   Procedure: CARDIOVERSION;  Surgeon: Minna Merritts, MD;  Location: [facility] ORS;  Service: Cardiovascular;  Laterality: N/A;  . LEFT HEART CATH AND CORONARY ANGIOGRAPHY N/A 10/31/2016   Procedure: Left Heart Cath and Coronary Angiography;  Surgeon: Wellington Hampshire, MD;  Location: [place] [facility];  Service: Cardiovascular;  Laterality: N/A;  . OTHER SURGICAL HISTORY  1998   Bypass   Allergies  Allergen Reactions  . Pravastatin Other (See Comments)  . Prednisone Other (See Comments)    Pt states that med makes him hyper Pt states that med makes him hyper   Prior to Admission medications   Medication Sig Start Date End Date Taking? Authorizing Provider  acetaminophen (TYLENOL) 500 MG tablet Take 500 mg every 6 (six) hours as needed by mouth for mild pain or moderate pain.    Yes [provider]  amiodarone (PACERONE) 200 MG tablet Take 200 mg by mouth daily.  07/30/15  Yes [provider]  buPROPion (WELLBUTRIN XL) 150 MG 24 hr tablet Take 1 tablet (150 mg total) by mouth daily. 04/19/17  Yes Sater, Nanine Means, MD  carbidopa-levodopa (SINEMET) 25-250 MG tablet Take 2 tablets by mouth 3 (three) times daily. 02/01/17  Yes Kathrynn Ducking, MD  carvedilol (COREG) 6.25 MG tablet TAKE ONE TABLET BY MOUTH TWICE DAILY WITH A MEAL 03/06/17  Yes Wellington Hampshire, MD  clopidogrel (PLAVIX) 75 MG tablet TAKE ONE TABLET BY MOUTH EVERY DAY WITH BREAKFAST 03/06/17  Yes Wellington Hampshire, MD  cyclobenzaprine (FLEXERIL) 5 MG tablet Take 5 mg by mouth 3 (three) times daily as needed for muscle spasms.   Yes [provider]  ELIQUIS 5 MG TABS tablet TAKE ONE TABLET TWICE DAILY 02/06/17  Yes Wellington Hampshire, MD  entacapone (COMTAN) 200 MG tablet Take 1 tablet (200 mg total) by mouth 3 (three) times daily. 03/29/17  Yes Kathrynn Ducking, MD  EPINEPHrine 0.3 mg/0.3 mL IJ SOAJ injection Inject  0.3 mg as directed once as needed (allergic reaction). Reported on 10/27/2015 07/01/13  Yes [provider]  esomeprazole (NEXIUM) 20 MG capsule Take 40 mg by mouth daily at 12 noon.   Yes [provider]  fluticasone (FLONASE) 50 MCG/ACT nasal spray TAKE 2 PUFFS IN EACH NOSTRIL EVERY DAY 10/18/16  Yes Cook, Jayce G, DO  furosemide (LASIX) 20 MG tablet Take 1 tablet (20 mg total) by mouth daily. 04/06/17 07/05/17 Yes Theora Gianotti, NP  Iodoquinol-HC-Aloe Polysacch (ALCORTIN A) 1-2-1 % GEL Apply 1 application topically daily. 12/08/16  Yes [provider]  isosorbide mononitrate (IMDUR) 60 MG 24 hr tablet Take 1 tablet (60 mg total) by mouth daily. 02/14/17  Yes Wellington Hampshire, MD  levothyroxine (SYNTHROID, LEVOTHROID) 50 MCG tablet 50 mcg daily before breakfast.  09/07/16  Yes [provider]  losartan (COZAAR) 100 MG tablet Take 1 tablet (100 mg total) by mouth daily. 02/14/17  Yes Wellington Hampshire, MD  rosuvastatin (CRESTOR) 10 MG tablet Take 1 tablet (10 mg total) by mouth daily. 02/14/17 05/15/17 Yes Wellington Hampshire, MD  VENTOLIN HFA 108 (90 Base) MCG/ACT inhaler TAKE 2 PUFFS EVERY 8 HOURS AS NEEDED FORWHEEZING 12/02/16  Yes Coral Spikes, DO   Social History   Socioeconomic History  . Marital status: Married    Spouse name: [name]  . Number of children: 2  . Years of education: 76  . Highest education level: Not on file  Social Needs  . Financial resource strain: Not on file  . Food insecurity - worry: Not on file  . Food insecurity - inability: Not on file  . Transportation needs - medical: Not on file  . Transportation needs - non-medical: Not on file  Occupational History  . Occupation: Retired    Comment: Designer, television/film set  Tobacco Use  . Smoking status: Former Smoker    Packs/day: 1.00    Years: 40.00    Pack years: 40.00    Types: Cigarettes, Pipe, Cigars    Last attempt to quit: 04/18/1972    Years since quitting: 45.1  .  Smokeless tobacco: Former Systems developer    Types: [place] date: 04/18/1972  Substance and Sexual Activity  . Alcohol use: Yes    Alcohol/week: 2.4 oz    Types: 2 Cans of beer, 2 Shots of liquor per week    Comment: per 2 weeks   . Drug use: No  . Sexual activity: Not Currently  Other Topics Concern  . Not on file  Social History Narrative   Lives w/ wife   Caffeine use: none   Right-handed   Review of Systems  Eyes: Negative for visual disturbance.  Gastrointestinal: Negative for nausea and vomiting.  Skin: Positive for wound.  Neurological: Negative for headaches.      Objective:   Physical Exam  Constitutional: He is oriented to person, place, and time. He appears well-developed and well-nourished. No distress.  HENT:  Head: Normocephalic and atraumatic. Head is without raccoon's eyes and without Battle's sign.  Right Ear: No hemotympanum.  Left Ear: No hemotympanum.  R scalp: Small abrasion over the temporal area without surrounding erythema. No bony tenderness. No racoon eyes. No hemotympanum. Neg battle sign.  Eyes: Conjunctivae and EOM are normal.  Neck: Neck supple. No tracheal deviation present.  Cardiovascular: Normal rate.  Pulmonary/Chest: Effort normal. No respiratory distress.  Musculoskeletal: Normal range of motion.  Abrasions over R hand. Abrasion with adherent scab over dorsal of 3rd MCP. Few small abrasions over dorsal of hand. No surrounding erythema. Some soft tissue swelling to R hand but is able to make a fist. Able to resist extension and flexion at fingers. Sensation intact. ~4 x 8 cm skin tear on R lateral forearm near the elbow without signs of surrounding erythema or discharge. Mild oozing of blood. Slight tenderness along lateral elbow, near radial head but only slight. Pain free ROM of elbow and wrist. Bruising proximal to elbow, medial aspect, distal upper arm. No focal bony tenderness.  Neurological: He is alert and oriented to person, place, and time.    Skin: Skin is warm and dry.  Psychiatric: He has a normal mood and affect. His behavior is normal.  Nursing note and vitals reviewed.  Vitals:   05/15/17 1233  BP: 132/62  Pulse: (!) 56  Resp: 18  Temp: 99.1 F (37.3 C)  TempSrc: Oral  SpO2: 92%  Weight: 266 lb 9.6 oz (120.9 kg)  Height: 5\' 7"  (1.702 m)   Dg Elbow Complete Right (3+view)  Result Date: 05/15/2017 CLINICAL DATA:  Pain and bruising secondary to trauma on 05/11/2017. EXAM: RIGHT ELBOW - COMPLETE 3+ VIEW COMPARISON:  None. FINDINGS: There is no evidence of fracture, dislocation, or joint effusion. Slight arthritis of the elbow joint. Prominent subcutaneous edema or hemorrhage consistent with the patient's history of bruising. IMPRESSION: No acute bone abnormality.  Soft tissue contusion. Electronically Signed   By: Lorriane Shire M.D.   On: 05/15/2017 13:55   Bactroban with Telfa and wrap applied to skin tear area after removal of adherent gauze and cleansing in office.     Assessment & Plan:   Andre [name] is a 82 y.o. male Right arm pain - Plan: DG ELBOW COMPLETE RIGHT (3+VIEW) Right elbow pain - Plan: DG ELBOW COMPLETE RIGHT (3+VIEW) Skin tear of right elbow without complication, initial encounter  - skin tear without sign of infection. Slight tenderness without concerning findings on x-ray, overall reassuring range of motion.  -Continue symptomatic care, cleansing and wound care discussed.   -recheck in 4 days.   Contusion of head, unspecified part of head, initial encounter  - Abrasion, no signs of concussion at this time, no signs of infection.   - RTC/ER precautions if worse.    No orders of the defined types were placed in this encounter.  Patient Instructions   No signs of broken bones on your x-ray. Skin tear does not appear to be infected at this time. Clean with soap and water, rinsing over area as listed below. Change dressing once per day, more often if needed if it becomes soaked. Recheck in 4  days, sooner if any worsening symptoms.  Scrape on head appears to be a contusion or abrasion. If any worsening headache, new or worsening dizziness, nausea or vomiting, return for recheck here or emergency room.   Facial or Scalp Contusion A facial or scalp contusion is a deep bruise (contusion) on the face or head. Injuries to the face and head generally cause a lot of swelling, especially around the eyes. Contusions are the result of an injury that caused bleeding under the skin. The contusion may turn blue, purple, or yellow. Minor injuries will give you a painless contusion, but more severe contusions may stay painful and swollen for a few weeks. What are the causes? A facial or scalp contusion is caused by a blunt injury, fall, or trauma to the face or head area. What are the signs or symptoms? Symptoms of this condition include:  Swelling of the injured area.  Discoloration of the injured area.  Tenderness, soreness, or pain in the injured area.  How is this diagnosed? This condition is diagnosed based on your medical history and a physical exam. An X-ray exam, CT scan, or MRI may be needed to check for any additional injuries, such as broken bones (fractures). How is this treated? Often, the best treatment for a facial or scalp contusion is applying cold compresses to the injured area. Over-the-counter medicines may also be recommended for pain control. Follow these instructions at home:  Take over-the-counter and prescription medicines only as told by your health care provider.  If directed, apply ice to the injured area. ? Put ice in a plastic bag. ? Place a towel between your skin and the  bag. ? Leave the ice on for 20 minutes, 2-3 times a day.  Keep all follow-up visits as told by your health care provider. This is important. Contact a health care provider if:  You have trouble biting or chewing.  Your pain or swelling gets worse.  You have pain when you move your  eyes. Get help right away if:  You have severe pain or a headache that is not relieved by medicine.  You have unusual sleepiness, confusion, or personality changes.  You vomit.  You have a nosebleed that does not stop.  You have double vision or blurred vision.  You have a continuous clear fluid draining from your nose or ear.  You have trouble walking or using your arms or legs.  You have severe dizziness. Summary  A facial or scalp contusion is a deep bruise (contusion) on the face or head.  Contusions are the result of an injury that caused bleeding under the skin.  Minor injuries will give you a painless contusion, but more severe contusions may stay painful and swollen for a few weeks.  Often, the best treatment for a facial or scalp contusion is applying cold compresses to the injured area. This information is not intended to replace advice given to you by your health care provider. Make sure you discuss any questions you have with your health care provider. Document Released: 05/12/2004 Document Revised: 02/23/2016 Document Reviewed: 02/23/2016 Elsevier Interactive Patient Education  2017 [employer].   Skin Tear Care A skin tear is a wound in which the top layers of skin have peeled off the deeper skin or tissues underneath them. This is a common problem as people get older because the skin becomes thinner and more fragile. In addition, some medicines, such as oral corticosteroids, can lead to skin thinning if they are taken for long periods of time. A skin tear is often repaired with tape or skin adhesive strips. Depending on the location of the wound, a bandage (dressing) may be applied over the tape or skin adhesive strips. Follow these instructions at home: Wound care  Clean the wound as told by your health care provider. You may be instructed to keep the wound dry for the first few days. If you are told to clean the wound: ? Wash the wound with mild soap and  water or a salt-water (saline) solution. ? Rinse the wound with water to remove all soap. ? Do not rub the wound dry. Let the wound air dry.  Change any dressings as told by your health care provider. This includes changing the dressing if it gets wet, gets dirty, or starts to smell bad.  Do not scratch or pick at the wound.  Protect the injured area until it has healed.  Check your wound every day for signs of infection. Check for: ? More redness, swelling, or pain. ? More fluid or blood. ? Warmth. ? Pus or a bad smell. Medicines   Take over-the-counter and prescription medicines only as told by your health care provider.  If you were prescribed an antibiotic medicine, take or apply it as told by your health care provider. Do not stop using the antibiotic even if your condition improves. General instructions  Keep the dressing dry as told by your health care provider.  Do not take baths, swim, or do anything that puts your wound underwater until your health care provider approves.  Keep all follow-up visits as told by your health care provider. This is important.  Contact a health care provider if:  You have more redness, swelling, or pain around your wound.  You have more fluid or blood coming from your wound.  Your wound feels warm to the touch.  You have pus or a bad smell coming from your wound. Get help right away if:  You have a red streak that goes away from the skin tear.  You have a fever and chills and your symptoms suddenly get worse. This information is not intended to replace advice given to you by your health care provider. Make sure you discuss any questions you have with your health care provider. Document Released: 12/28/2000 Document Revised: 11/29/2015 Document Reviewed: 02/23/2015 Elsevier Interactive Patient Education  2018 [employer].    IF you received an x-ray today, you will receive an invoice from [facility]. Please contact  [facility] at [phone] with questions or concerns regarding your invoice.   IF you received labwork today, you will receive an invoice from [facility]. Please contact [facility] at [phone] with questions or concerns regarding your invoice.   Our billing staff will not be able to assist you with questions regarding bills from these companies.  You will be contacted with the lab results as soon as they are available. The fastest way to get your results is to activate your My Chart account. Instructions are located on the last page of this paperwork. If you have not heard from [place] regarding the results in 2 weeks, please contact this office.      I personally performed the services described in this documentation, which was scribed in my presence. The recorded information has been reviewed and considered for accuracy and completeness, addended by me as needed, and agree with information above.  Signed,   [name], MD [facility].  05/17/17 9:17 PM

## 2017-05-15 NOTE — Patient Instructions (Addendum)
No signs of broken bones on your x-ray. Skin tear does not appear to be infected at this time. Clean with soap and water, rinsing over area as listed below. Change dressing once per day, more often if needed if it becomes soaked. Recheck in 4 days, sooner if any worsening symptoms.  Scrape on head appears to be a contusion or abrasion. If any worsening headache, new or worsening dizziness, nausea or vomiting, return for recheck here or emergency room.   Facial or Scalp Contusion A facial or scalp contusion is a deep bruise (contusion) on the face or head. Injuries to the face and head generally cause a lot of swelling, especially around the eyes. Contusions are the result of an injury that caused bleeding under the skin. The contusion may turn blue, purple, or yellow. Minor injuries will give you a painless contusion, but more severe contusions may stay painful and swollen for a few weeks. What are the causes? A facial or scalp contusion is caused by a blunt injury, fall, or trauma to the face or head area. What are the signs or symptoms? Symptoms of this condition include:  Swelling of the injured area.  Discoloration of the injured area.  Tenderness, soreness, or pain in the injured area.  How is this diagnosed? This condition is diagnosed based on your medical history and a physical exam. An X-ray exam, CT scan, or MRI may be needed to check for any additional injuries, such as broken bones (fractures). How is this treated? Often, the best treatment for a facial or scalp contusion is applying cold compresses to the injured area. Over-the-counter medicines may also be recommended for pain control. Follow these instructions at home:  Take over-the-counter and prescription medicines only as told by your health care provider.  If directed, apply ice to the injured area. ? Put ice in a plastic bag. ? Place a towel between your skin and the bag. ? Leave the ice on for 20 minutes, 2-3 times  a day.  Keep all follow-up visits as told by your health care provider. This is important. Contact a health care provider if:  You have trouble biting or chewing.  Your pain or swelling gets worse.  You have pain when you move your eyes. Get help right away if:  You have severe pain or a headache that is not relieved by medicine.  You have unusual sleepiness, confusion, or personality changes.  You vomit.  You have a nosebleed that does not stop.  You have double vision or blurred vision.  You have a continuous clear fluid draining from your nose or ear.  You have trouble walking or using your arms or legs.  You have severe dizziness. Summary  A facial or scalp contusion is a deep bruise (contusion) on the face or head.  Contusions are the result of an injury that caused bleeding under the skin.  Minor injuries will give you a painless contusion, but more severe contusions may stay painful and swollen for a few weeks.  Often, the best treatment for a facial or scalp contusion is applying cold compresses to the injured area. This information is not intended to replace advice given to you by your health care provider. Make sure you discuss any questions you have with your health care provider. Document Released: 05/12/2004 Document Revised: 02/23/2016 Document Reviewed: 02/23/2016 Elsevier Interactive Patient Education  2017 [place].   Skin Tear Care A skin tear is a wound in which the top layers of  skin have peeled off the deeper skin or tissues underneath them. This is a common problem as people get older because the skin becomes thinner and more fragile. In addition, some medicines, such as oral corticosteroids, can lead to skin thinning if they are taken for long periods of time. A skin tear is often repaired with tape or skin adhesive strips. Depending on the location of the wound, a bandage (dressing) may be applied over the tape or skin adhesive strips. Follow  these instructions at home: Wound care  Clean the wound as told by your health care provider. You may be instructed to keep the wound dry for the first few days. If you are told to clean the wound: ? Wash the wound with mild soap and water or a salt-water (saline) solution. ? Rinse the wound with water to remove all soap. ? Do not rub the wound dry. Let the wound air dry.  Change any dressings as told by your health care provider. This includes changing the dressing if it gets wet, gets dirty, or starts to smell bad.  Do not scratch or pick at the wound.  Protect the injured area until it has healed.  Check your wound every day for signs of infection. Check for: ? More redness, swelling, or pain. ? More fluid or blood. ? Warmth. ? Pus or a bad smell. Medicines   Take over-the-counter and prescription medicines only as told by your health care provider.  If you were prescribed an antibiotic medicine, take or apply it as told by your health care provider. Do not stop using the antibiotic even if your condition improves. General instructions  Keep the dressing dry as told by your health care provider.  Do not take baths, swim, or do anything that puts your wound underwater until your health care provider approves.  Keep all follow-up visits as told by your health care provider. This is important. Contact a health care provider if:  You have more redness, swelling, or pain around your wound.  You have more fluid or blood coming from your wound.  Your wound feels warm to the touch.  You have pus or a bad smell coming from your wound. Get help right away if:  You have a red streak that goes away from the skin tear.  You have a fever and chills and your symptoms suddenly get worse. This information is not intended to replace advice given to you by your health care provider. Make sure you discuss any questions you have with your health care provider. Document Released:  12/28/2000 Document Revised: 11/29/2015 Document Reviewed: 02/23/2015 Elsevier Interactive Patient Education  2018 [employer].    IF you received an x-ray today, you will receive an invoice from [facility]. Please contact [facility] at [phone] with questions or concerns regarding your invoice.   IF you received labwork today, you will receive an invoice from [place] [facility]. Please contact [facility] at [phone] with questions or concerns regarding your invoice.   Our billing staff will not be able to assist you with questions regarding bills from these companies.  You will be contacted with the lab results as soon as they are available. The fastest way to get your results is to activate your My Chart account. Instructions are located on the last page of this paperwork. If you have not heard from [place] regarding the results in 2 weeks, please contact this office.

## 2017-05-16 NOTE — Telephone Encounter (Signed)
Pt wife (on DPR)has called BW:GYKZLDJTT (WELLBUTRIN XL) 150 MG 24 hr tablet.  Wife states that since pt has been on this there has been complaining of ringing in the ears, dizziness and wobbling, wife feels it may be because of medication since pt did not experience this on previous medication.  Please give pt wife a call

## 2017-05-16 NOTE — Telephone Encounter (Signed)
I called the patient.  The patient has been placed on Wellbutrin, he was taken off of Zoloft.  The Wellbutrin was started on 19 April 2017.  The patient complains of some dizziness, being off balance, ringing in the ears.  He can stop the Wellbutrin, he will let me know over the next several weeks if he is feeling better.  The patient did have a fall recently, he required a medical evaluation, head scan.  He is on blood thinners.

## 2017-05-17 ENCOUNTER — Encounter: Payer: Self-pay | Admitting: Family Medicine

## 2017-05-18 ENCOUNTER — Encounter: Payer: Self-pay | Admitting: Cardiovascular Disease

## 2017-05-18 ENCOUNTER — Ambulatory Visit: Payer: PPO | Admitting: Cardiovascular Disease

## 2017-05-18 VITALS — BP 160/70 | HR 57 | Ht 67.0 in | Wt 269.0 lb

## 2017-05-18 DIAGNOSIS — Z79899 Other long term (current) drug therapy: Secondary | ICD-10-CM

## 2017-05-18 DIAGNOSIS — I480 Paroxysmal atrial fibrillation: Secondary | ICD-10-CM | POA: Diagnosis not present

## 2017-05-18 DIAGNOSIS — I10 Essential (primary) hypertension: Secondary | ICD-10-CM

## 2017-05-18 DIAGNOSIS — E785 Hyperlipidemia, unspecified: Secondary | ICD-10-CM

## 2017-05-18 DIAGNOSIS — I25118 Atherosclerotic heart disease of native coronary artery with other forms of angina pectoris: Secondary | ICD-10-CM | POA: Diagnosis not present

## 2017-05-18 MED ORDER — AMIODARONE HCL 100 MG PO TABS: 100.0000 mg | ORAL_TABLET | Freq: Every day | ORAL | 5 refills | Status: DC

## 2017-05-18 NOTE — Progress Notes (Signed)
Cardiology Office Note   Date:  05/18/2017   ID:  [name], DOB 10/18/1935, MRN [id number]  PCP:  [name], MD  Cardiologist:   [name], MD   Chief Complaint  Patient presents with  . Other    3 month follow up. patient c/o SOB. Meds reviewed verbally with patient.       History of Present Illness: Andre [name] is a 82 y.o. male who presents for a follow-up visit regarding coronary artery disease.   He has known history of coronary artery disease status post 1 vessel CABG with LIMA to LAD in 1998, hypertension, hyperlipidemia, chronic diastolic heart aure, paroxysmal atrial fibrillation and morbid obesity. He also has history of pulmonary embolism and Parkinson's He was hospitalized in July of 2018 with a small non-ST elevation myocardial infarction. Echocardiogram showed normal LV systolic function with severely dilated left atrium. Cardiac catheterization showed occluded native LAD with patent LIMA, severe proximal RCA stenosis with heavy thrombus and moderate distal left main stenosis with significant ostial stenosis in the ramus branch. EF was 50-55% with mildly elevated left ventricular end-diastolic pressure. I performed successful angioplasty and drug-eluting stent placement to the right coronary artery which was complicated by embolization into a large RV branch at the lesion site. This was treated with balloon angioplasty which established TIMI 1 flow.  He was seen by Ignacia Bayley in the last few months for volume overload.  He was not taking furosemide on a regular basis.  Symptoms improved with furosemide 20 mg once daily.  The dose was increased to 40 mg daily for 3 days. The patient was in [place] recently and fell in [employer] parking lot due to loss of balance.  He had right arm contusion and small head laceration.  He went to the local emergency room there and was told there was no fractures.  CT head showed no bleed.  He reports stable  dyspnea with no chest pain.  No significant leg edema.  Past Medical History:  Diagnosis Date  . Cervical spondylosis 10/01/2013  . Chronic diastolic CHF (congestive heart failure) ([place])    a. 07/2016 Echo: >55%; b. 10/2016 Echo: EF 55-60%, Gr1 DD, Ao sclerosis w/o stenosis, sev dil LA.  [name] Coronary artery disease    a. 1998 s/p mini-cabg @ [facility] - LIMA->LAD;  b. 07/2016 St Echo:  Inadequate HR (max 97) w/ hypertensive response (220/96). Ex time only 2:54 - stopped due to dyspnea and leg pain;  c.  08/2016 MV: EF 67%, no ischemia, low risk; d. 10/2016 NSTEMI/Cath: LM 40, LAD 100ost, RI 80, LCX nl, RCA 95p (4.0x26 Onyx DES), 26m, LIMA->LAD nl, EF 50-55%.  . Depression   . GERD (gastroesophageal reflux disease)   . Hyperlipidemia   . Hypertension   . PAF (paroxysmal atrial fibrillation) ([facility])    a. s/p DCCV-->maintaining sinus on amiodarone;  b. CHA2DS2VASc = 5-->eliquis.  . Parkinson's disease ([place])    tremors  . Pulmonary embolism ([place]) 2011  . 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 Sleep apnea    wears CPAP    Past Surgical History:  Procedure Laterality Date  . BACK SURGERY  1960  . CARDIAC CATHETERIZATION    . CHOLECYSTECTOMY  2010  . CORONARY ARTERY BYPASS GRAFT  01/07/1997  . CORONARY STENT INTERVENTION N/A 10/31/2016   Procedure: Coronary Stent Intervention;  Surgeon: Wellington Hampshire, MD;  Location: [place] [facility];  Service: Cardiovascular;  Laterality: N/A;  . ELECTROPHYSIOLOGIC STUDY  N/A 07/14/2015   Procedure: CARDIOVERSION;  Surgeon: Yolonda Kida, MD;  Location: [facility] ORS;  Service: Cardiovascular;  Laterality: N/A;  . ELECTROPHYSIOLOGIC STUDY N/A 10/12/2015   Procedure: CARDIOVERSION;  Surgeon: Minna Merritts, MD;  Location: [facility] ORS;  Service: Cardiovascular;  Laterality: N/A;  . LEFT HEART CATH AND CORONARY ANGIOGRAPHY N/A 10/31/2016   Procedure: Left Heart Cath and Coronary Angiography;  Surgeon: Wellington Hampshire, MD;  Location: [place] [facility];  Service:  Cardiovascular;  Laterality: N/A;  . OTHER SURGICAL HISTORY  1998   Bypass     Current Outpatient Medications  Medication Sig Dispense Refill  . acetaminophen (TYLENOL) 500 MG tablet Take 500 mg every 6 (six) hours as needed by mouth for mild pain or moderate pain.     [name] amiodarone (PACERONE) 200 MG tablet Take 200 mg by mouth daily.     . carbidopa-levodopa (SINEMET) 25-250 MG tablet Take 2 tablets by mouth 3 (three) times daily. 540 tablet 3  . carvedilol (COREG) 6.25 MG tablet TAKE ONE TABLET BY MOUTH TWICE DAILY WITH A MEAL 60 tablet 3  . clopidogrel (PLAVIX) 75 MG tablet TAKE ONE TABLET BY MOUTH EVERY DAY WITH BREAKFAST 30 tablet 3  . cyclobenzaprine (FLEXERIL) 5 MG tablet Take 5 mg by mouth 3 (three) times daily as needed for muscle spasms.    [name] ELIQUIS 5 MG TABS tablet TAKE ONE TABLET TWICE DAILY 60 tablet 6  . entacapone (COMTAN) 200 MG tablet Take 1 tablet (200 mg total) by mouth 3 (three) times daily. 90 tablet 3  . EPINEPHrine 0.3 mg/0.3 mL IJ SOAJ injection Inject 0.3 mg as directed once as needed (allergic reaction). Reported on 10/27/2015    . esomeprazole (NEXIUM) 20 MG capsule Take 40 mg by mouth daily at 12 noon.    . fluticasone (FLONASE) 50 MCG/ACT nasal spray TAKE 2 PUFFS IN EACH NOSTRIL EVERY DAY 16 g 3  . furosemide (LASIX) 20 MG tablet Take 1 tablet (20 mg total) by mouth daily. 90 tablet 3  . Iodoquinol-HC-Aloe Polysacch (ALCORTIN A) 1-2-1 % GEL Apply 1 application topically daily.  3  . isosorbide mononitrate (IMDUR) 60 MG 24 hr tablet Take 1 tablet (60 mg total) by mouth daily. 90 tablet 3  . levothyroxine (SYNTHROID, LEVOTHROID) 50 MCG tablet 50 mcg daily before breakfast.     . losartan (COZAAR) 100 MG tablet Take 1 tablet (100 mg total) by mouth daily. 90 tablet 3  . VENTOLIN HFA 108 (90 Base) MCG/ACT inhaler TAKE 2 PUFFS EVERY 8 HOURS AS NEEDED FORWHEEZING 18 g 3  . rosuvastatin (CRESTOR) 10 MG tablet Take 1 tablet (10 mg total) by mouth daily. 90 tablet 3   No  current facility-administered medications for this visit.     Allergies:   Pravastatin and Prednisone    Social History:  The patient  reports that he quit smoking about 45 years ago. His smoking use included cigarettes, pipe, and cigars. He has a 40.00 pack-year smoking history. He quit smokeless tobacco use about 45 years ago. His smokeless tobacco use included chew. He reports that he drinks about 2.4 oz of alcohol per week. He reports that he does not use drugs.   Family History:  The patient's Family history is unknown by patient.    ROS:  Please see the history of present illness.   Otherwise, review of systems are positive for none.   All other systems are reviewed and negative.    PHYSICAL EXAM: VS:  BP (!) 160/70 (BP Location: Left Arm, Patient Position: Sitting, Cuff Size: Normal)   Pulse (!) 57   Ht 5\' 7"  (1.702 m)   Wt 269 lb (122 kg)   BMI 42.13 kg/m  , BMI Body mass index is 42.13 kg/m. GEN: Well nourished, well developed, in no acute distress  HEENT: normal  Neck: no JVD, carotid bruits, or masses Cardiac: RRR; no murmurs, rubs, or gallops,no edema  Respiratory:  clear to auscultation bilaterally, normal work of breathing GI: soft, nontender, nondistended, + BS MS: no deformity or atrophy  Skin: warm and dry, no rash Neuro:  Strength and sensation are intact Psych: euthymic mood, full affect  EKG:  EKG is ordered today. The ekg ordered today demonstrates sinus bradycardia with first-degree AV block.  Left axis deviation.  Lateral T wave changes suggestive of ischemia.  Recent Labs: 10/27/2016: TSH 3.31 03/01/2017: ALT 6; Hemoglobin 14.8; Platelets 114 04/06/2017: BUN 19; Creatinine, Ser 1.03; Potassium 4.4; Sodium 141    Lipid Panel    Component Value Date/Time   CHOL 183 10/29/2016 0336   TRIG 229 (H) 10/29/2016 0336   HDL 45 10/29/2016 0336   CHOLHDL 4.1 10/29/2016 0336   VLDL 46 (H) 10/29/2016 0336   LDLCALC 92 10/29/2016 0336      Wt Readings  from Last 3 Encounters:  05/18/17 269 lb (122 kg)  05/15/17 266 lb 9.6 oz (120.9 kg)  04/06/17 265 lb (120.2 kg)       No flowsheet data found.    ASSESSMENT AND PLAN:  1.coronary artery disease involving native coronary arteries with other forms of angina: Overall, he is doing reasonably well with stable symptoms.  Continue medical therapy.  He has residual disease affecting the ramus artery. PCI should be left as a last resort given the moderate left main stenosis and bifurcation with the left circumflex. I am planning to use Plavix until July 2019 and then stop the medication given that he is on Eliquis.  Low-dose aspirin can be added at that time.  2. Atrial fibrillation: Maintaining in sinus rhythm with amiodarone. Continue anticoagulation with Eliquis.  I elected to decrease the dose of amiodarone to 100 mg once daily.  Given the patient's continued dyspnea on long-term amiodarone use, I want him to have full pulmonary function testing.  He was supposed to have these done last year and they were ordered by Dr. Mortimer Fries but it appears that they were not done.  3. Essential hypertension: Blood pressure continues to be mildly elevated.  He is on multiple medications.  Made no changes.  4. Hyperlipidemia: He had myalgia with atorvastatin to be tolerating rosuvastatin very well.   Disposition:   FU with me in 4 months  Signed,  [name], MD  05/18/2017 11:21 AM    Andre [name]

## 2017-05-18 NOTE — Patient Instructions (Addendum)
Medication Instructions:  Your physician has recommended you make the following change in your medication:  DECREASE amiodarone to 100mg  once daily    Labwork: none  Testing/Procedures: Pulmonary function test at [facility]. Please refer to the patient instructions given to you. February 5, 12:45pm arrival   Follow-Up: Your physician recommends that you schedule a follow-up appointment in: 4 months with Dr. Fletcher Anon.    Any Other Special Instructions Will Be Listed Below (If Applicable).     If you need a refill on your cardiac medications before your next appointment, please call your pharmacy.

## 2017-05-19 ENCOUNTER — Ambulatory Visit (INDEPENDENT_AMBULATORY_CARE_PROVIDER_SITE_OTHER): Payer: PPO | Admitting: Family Medicine

## 2017-05-19 ENCOUNTER — Encounter: Payer: Self-pay | Admitting: Family Medicine

## 2017-05-19 ENCOUNTER — Other Ambulatory Visit: Payer: Self-pay

## 2017-05-19 VITALS — BP 141/66 | HR 60 | Temp 97.5°F | Resp 20 | Ht 67.13 in | Wt 266.5 lb

## 2017-05-19 DIAGNOSIS — S51811D Laceration without foreign body of right forearm, subsequent encounter: Secondary | ICD-10-CM | POA: Diagnosis not present

## 2017-05-19 MED ORDER — MUPIROCIN 2 % EX OINT: 1.0000 "application " | TOPICAL_OINTMENT | Freq: Every day | CUTANEOUS | 0 refills | Status: DC

## 2017-05-19 NOTE — Patient Instructions (Addendum)
   Skin tear appears to be healing okay. I did write for Bactroban ointment to apply once per day after dressing changes. His wound heals next week, you may not need that further. Please follow-up with myself or your primary care provider in one week. Sooner if worse.  Bruising on arm is likely due to your blood thinner and initial injury. X-ray last time was reassuring. If you have any new areas of pain, have those seen and evaluated.  Return to the clinic or go to the nearest emergency room if any of your symptoms worsen or new symptoms occur.   IF you received an x-ray today, you will receive an invoice from [facility]. Please contact [facility] at [phone] with questions or concerns regarding your invoice.   IF you received labwork today, you will receive an invoice from [place]. Please contact [facility] at [phone] with questions or concerns regarding your invoice.   Our billing staff will not be able to assist you with questions regarding bills from these companies.  You will be contacted with the lab results as soon as they are available. The fastest way to get your results is to activate your My Chart account. Instructions are located on the last page of this paperwork. If you have not heard from [place] regarding the results in 2 weeks, please contact this office.

## 2017-05-19 NOTE — Progress Notes (Signed)
Subjective:    Patient ID: Andre [name], male    DOB: 1935-07-02, 82 y.o.   MRN: [id number]  HPI Andre [name] is a 82 y.o. male Presents today for: Chief Complaint  Patient presents with  . Wound Check    f/u on right elbow   Seen for follow-up on skin care of right forearm at elbow. See last office visit from 4 days ago. Area was cleaned and discussed wound care at that time. X-ray was reassuring. Also noted to have a scalp contusion at that time without other symptoms of head injury.  Bruising is more notable on elbow, but able to bend/use elbow ok. Has been changing bandage after washing every other day. No fever, no new discharge, no odor.   Patient Active Problem List   Diagnosis Date Noted  . Abrasion 02/08/2017  . Depression 01/05/2017  . Prediabetes 10/27/2016  . Hypothyroidism 10/27/2016  . (HFpEF) heart failure with preserved ejection fraction ([place]) 09/27/2016  . Parkinson's disease ([place]) 06/30/2016  . PAF (paroxysmal atrial fibrillation) ([place])   . BMI 40.0-44.9, adult ([place]) 05/06/2015  . Erythrocytosis 01/28/2015  . Atherosclerosis of abdominal aorta ([place]) 12/08/2014  . Barrett's esophagus 12/31/2013  . DDD (degenerative disc disease), cervical 10/01/2013  . DDD (degenerative disc disease), lumbar 10/01/2013  . Coronary artery disease involving native coronary artery of native heart with angina pectoris ([place]) 10/30/2012  . GERD (gastroesophageal reflux disease) 10/30/2012  . Hyperlipidemia with target LDL less than 70 10/30/2012  . Essential hypertension 10/30/2012  . Allergic rhinitis 09/10/2012  . OSA (obstructive sleep apnea) 09/10/2012   Past Medical History:  Diagnosis Date  . Cervical spondylosis 10/01/2013  . Chronic diastolic CHF (congestive heart failure) ([place])    a. 07/2016 Echo: >55%; b. 10/2016 Echo: EF 55-60%, Gr1 DD, Ao sclerosis w/o stenosis, sev dil LA.  [name] Coronary artery disease    a. 1998 s/p mini-cabg @ [facility] - LIMA->LAD;  b. 07/2016 St Echo:   Inadequate HR (max 97) w/ hypertensive response (220/96). Ex time only 2:54 - stopped due to dyspnea and leg pain;  c.  08/2016 MV: EF 67%, no ischemia, low risk; d. 10/2016 NSTEMI/Cath: LM 40, LAD 100ost, RI 80, LCX nl, RCA 95p (4.0x26 Onyx DES), 49m, LIMA->LAD nl, EF 50-55%.  . Depression   . GERD (gastroesophageal reflux disease)   . Hyperlipidemia   . Hypertension   . PAF (paroxysmal atrial fibrillation) ([facility])    a. s/p DCCV-->maintaining sinus on amiodarone;  b. CHA2DS2VASc = 5-->eliquis.  . Parkinson's disease ([place])    tremors  . Pulmonary embolism ([place]) 2011  . 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 Sleep apnea    wears CPAP   Past Surgical History:  Procedure Laterality Date  . BACK SURGERY  1960  . CARDIAC CATHETERIZATION    . CHOLECYSTECTOMY  2010  . CORONARY ARTERY BYPASS GRAFT  01/07/1997  . CORONARY STENT INTERVENTION N/A 10/31/2016   Procedure: Coronary Stent Intervention;  Surgeon: Wellington Hampshire, MD;  Location: [place] [facility];  Service: Cardiovascular;  Laterality: N/A;  . ELECTROPHYSIOLOGIC STUDY N/A 07/14/2015   Procedure: CARDIOVERSION;  Surgeon: Yolonda Kida, MD;  Location: [facility];  Service: Cardiovascular;  Laterality: N/A;  . ELECTROPHYSIOLOGIC STUDY N/A 10/12/2015   Procedure: CARDIOVERSION;  Surgeon: Minna Merritts, MD;  Location: [facility] OR[facility];  Service: Cardiovascular;  Laterality: N/A;  . LEFT HEART CATH AND CORONARY ANGIOGRAPHY N/A 10/31/2016   Procedure: Left Heart Cath and Coronary Angiography;  Surgeon: Fletcher Anon,  Mertie Clause, MD;  Location: [place] [facility];  Service: Cardiovascular;  Laterality: N/A;  . OTHER SURGICAL HISTORY  1998   Bypass   Allergies  Allergen Reactions  . Pravastatin Other (See Comments)  . Prednisone Other (See Comments)    Pt states that med makes him hyper Pt states that med makes him hyper   Prior to Admission medications   Medication Sig Start Date End Date Taking? Authorizing Provider  acetaminophen (TYLENOL) 500  MG tablet Take 500 mg every 6 (six) hours as needed by mouth for mild pain or moderate pain.    Yes [provider]  amiodarone (PACERONE) 100 MG tablet Take 1 tablet (100 mg total) by mouth daily. 05/18/17  Yes Wellington Hampshire, MD  carbidopa-levodopa (SINEMET) 25-250 MG tablet Take 2 tablets by mouth 3 (three) times daily. 02/01/17  Yes Kathrynn Ducking, MD  carvedilol (COREG) 6.25 MG tablet TAKE ONE TABLET BY MOUTH TWICE DAILY WITH A MEAL 03/06/17  Yes Wellington Hampshire, MD  clopidogrel (PLAVIX) 75 MG tablet TAKE ONE TABLET BY MOUTH EVERY DAY WITH BREAKFAST 03/06/17  Yes Wellington Hampshire, MD  cyclobenzaprine (FLEXERIL) 5 MG tablet Take 5 mg by mouth 3 (three) times daily as needed for muscle spasms.   Yes [provider]  ELIQUIS 5 MG TABS tablet TAKE ONE TABLET TWICE DAILY 02/06/17  Yes Wellington Hampshire, MD  entacapone (COMTAN) 200 MG tablet Take 1 tablet (200 mg total) by mouth 3 (three) times daily. 03/29/17  Yes Kathrynn Ducking, MD  EPINEPHrine 0.3 mg/0.3 mL IJ SOAJ injection Inject 0.3 mg as directed once as needed (allergic reaction). Reported on 10/27/2015 07/01/13  Yes [provider]  esomeprazole (NEXIUM) 20 MG capsule Take 40 mg by mouth daily at 12 noon.   Yes [provider]  fluticasone (FLONASE) 50 MCG/ACT nasal spray TAKE 2 PUFFS IN EACH NOSTRIL EVERY DAY 10/18/16  Yes Cook, Jayce G, DO  furosemide (LASIX) 20 MG tablet Take 1 tablet (20 mg total) by mouth daily. 04/06/17 07/05/17 Yes Theora Gianotti, NP  Iodoquinol-HC-Aloe Polysacch (ALCORTIN A) 1-2-1 % GEL Apply 1 application topically daily. 12/08/16  Yes [provider]  isosorbide mononitrate (IMDUR) 60 MG 24 hr tablet Take 1 tablet (60 mg total) by mouth daily. 02/14/17  Yes Wellington Hampshire, MD  levothyroxine (SYNTHROID, LEVOTHROID) 50 MCG tablet 50 mcg daily before breakfast.  09/07/16  Yes [provider]  losartan (COZAAR) 100 MG tablet Take 1 tablet (100 mg  total) by mouth daily. 02/14/17  Yes Wellington Hampshire, MD  VENTOLIN HFA 108 (90 Base) MCG/ACT inhaler TAKE 2 PUFFS EVERY 8 HOURS AS NEEDED FORWHEEZING 12/02/16  Yes Cook, Jayce G, DO  rosuvastatin (CRESTOR) 10 MG tablet Take 1 tablet (10 mg total) by mouth daily. 02/14/17 05/15/17  Wellington Hampshire, MD   Social History   Socioeconomic History  . Marital status: Married    Spouse name: [name]  . Number of children: 1  . Years of education: 76  . Highest education level: Not on file  Social Needs  . Financial resource strain: Not on file  . Food insecurity - worry: Not on file  . Food insecurity - inability: Not on file  . Transportation needs - medical: Not on file  . Transportation needs - non-medical: Not on file  Occupational History  . Occupation: Retired    Comment: Designer, television/film set  Tobacco Use  . Smoking status: Former Smoker  Packs/day: 1.00    Years: 40.00    Pack years: 40.00    Types: Cigarettes, Pipe, Cigars    Last attempt to quit: 04/18/1972    Years since quitting: 45.1  . Smokeless tobacco: Former Systems developer    Types: [place] date: 04/18/1972  Substance and Sexual Activity  . Alcohol use: Yes    Alcohol/week: 2.4 oz    Types: 2 Cans of beer, 2 Shots of liquor per week    Comment: per 2 weeks   . Drug use: No  . Sexual activity: Yes  Other Topics Concern  . Not on file  Social History Narrative   Lives w/ wife   Caffeine use: none   Right-handed     Review of Systems  Constitutional: Negative for fever.  Musculoskeletal: Negative for arthralgias.  Skin: Positive for color change and wound.  Neurological: Negative for weakness.       Objective:   Physical Exam  Constitutional: He appears well-developed and well-nourished. No distress.  Pulmonary/Chest: Effort normal.  Musculoskeletal:       Right elbow: He exhibits normal range of motion.       Arms:  No bony tenderness of elbow, forearm or humerus on right Vitals:   05/19/17 1332  05/19/17 1336  BP: (!) 141/66   Pulse: 60   Resp: 20   Temp: (!) 97.5 F (36.4 C) (!) 97.5 F (36.4 C)  TempSrc: Oral Oral  SpO2: 96%   Weight: 266 lb 8 oz (120.9 kg)   Height: 5' 7.13" (1.705 m)        Assessment & Plan:   Andre [name] is a 82 y.o. male Skin tear of right forearm without complication, subsequent encounter - Plan: mupirocin ointment (BACTROBAN) 2 % Skin tear appears to be overall healing, stable. Start Bactroban once per day after wound cleansing as possible minimal adherent exudate on initial exam. Continue wound cleansing, bandaging QD and follow-up with myself or primary care provider in the next 1 week. Sooner if worse.  Advise that hematoma/bruising on posterior arm is likely from initial injury. No bony tenderness, no further workup at this time. Follow-up if any new areas of discomfort or worsening symptoms.   Meds ordered this encounter  Medications  . mupirocin ointment (BACTROBAN) 2 %    Sig: Apply 1 application topically daily. After cleaning wound.    Dispense:  22 g    Refill:  0   Patient Instructions     Skin tear appears to be healing okay. I did write for Bactroban ointment to apply once per day after dressing changes. His wound heals next week, you may not need that further. Please follow-up with myself or your primary care provider in one week. Sooner if worse.  Bruising on arm is likely due to your blood thinner and initial injury. X-ray last time was reassuring. If you have any new areas of pain, have those seen and evaluated.  Return to the clinic or go to the nearest emergency room if any of your symptoms worsen or new symptoms occur.   IF you received an x-ray today, you will receive an invoice from [facility] Radiology. Please contact [facility] Radiology at [phone] with questions or concerns regarding your invoice.   IF you received labwork today, you will receive an invoice from [place]. Please contact [facility] at  [phone] with questions or concerns regarding your invoice.   Our billing staff will not be able to assist you with  questions regarding bills from these companies.  You will be contacted with the lab results as soon as they are available. The fastest way to get your results is to activate your My Chart account. Instructions are located on the last page of this paperwork. If you have not heard from [place] regarding the results in 2 weeks, please contact this office.      I personally performed the services described in this documentation, which was scribed in my presence. The recorded information has been reviewed and considered for accuracy and completeness, addended by me as needed, and agree with information above.  Signed,   [name], MD Primary Care at [place].  05/19/17 2:12 PM

## 2017-05-23 ENCOUNTER — Ambulatory Visit: Payer: PPO | Attending: Cardiovascular Disease

## 2017-05-23 DIAGNOSIS — Z7901 Long term (current) use of anticoagulants: Secondary | ICD-10-CM | POA: Diagnosis not present

## 2017-05-23 DIAGNOSIS — I25118 Atherosclerotic heart disease of native coronary artery with other forms of angina pectoris: Secondary | ICD-10-CM | POA: Diagnosis not present

## 2017-05-23 DIAGNOSIS — E785 Hyperlipidemia, unspecified: Secondary | ICD-10-CM | POA: Diagnosis not present

## 2017-05-23 DIAGNOSIS — I4891 Unspecified atrial fibrillation: Secondary | ICD-10-CM | POA: Diagnosis not present

## 2017-05-23 DIAGNOSIS — I10 Essential (primary) hypertension: Secondary | ICD-10-CM | POA: Diagnosis not present

## 2017-05-23 DIAGNOSIS — Z79899 Other long term (current) drug therapy: Secondary | ICD-10-CM | POA: Diagnosis not present

## 2017-05-24 ENCOUNTER — Inpatient Hospital Stay: Payer: PPO

## 2017-05-24 ENCOUNTER — Inpatient Hospital Stay: Payer: PPO | Attending: Internal Medicine | Admitting: *Deleted

## 2017-05-24 DIAGNOSIS — E785 Hyperlipidemia, unspecified: Secondary | ICD-10-CM | POA: Diagnosis not present

## 2017-05-24 DIAGNOSIS — I480 Paroxysmal atrial fibrillation: Secondary | ICD-10-CM | POA: Insufficient documentation

## 2017-05-24 DIAGNOSIS — Z951 Presence of aortocoronary bypass graft: Secondary | ICD-10-CM | POA: Insufficient documentation

## 2017-05-24 DIAGNOSIS — Z7901 Long term (current) use of anticoagulants: Secondary | ICD-10-CM | POA: Diagnosis not present

## 2017-05-24 DIAGNOSIS — I5032 Chronic diastolic (congestive) heart failure: Secondary | ICD-10-CM | POA: Insufficient documentation

## 2017-05-24 DIAGNOSIS — Z955 Presence of coronary angioplasty implant and graft: Secondary | ICD-10-CM | POA: Diagnosis not present

## 2017-05-24 DIAGNOSIS — Z86711 Personal history of pulmonary embolism: Secondary | ICD-10-CM | POA: Diagnosis not present

## 2017-05-24 DIAGNOSIS — G20 Parkinson's disease: Secondary | ICD-10-CM | POA: Diagnosis not present

## 2017-05-24 DIAGNOSIS — I2510 Atherosclerotic heart disease of native coronary artery without angina pectoris: Secondary | ICD-10-CM | POA: Diagnosis not present

## 2017-05-24 DIAGNOSIS — D696 Thrombocytopenia, unspecified: Secondary | ICD-10-CM | POA: Diagnosis not present

## 2017-05-24 DIAGNOSIS — F329 Major depressive disorder, single episode, unspecified: Secondary | ICD-10-CM | POA: Diagnosis not present

## 2017-05-24 DIAGNOSIS — D751 Secondary polycythemia: Secondary | ICD-10-CM | POA: Insufficient documentation

## 2017-05-24 DIAGNOSIS — K219 Gastro-esophageal reflux disease without esophagitis: Secondary | ICD-10-CM | POA: Diagnosis not present

## 2017-05-24 DIAGNOSIS — Z79899 Other long term (current) drug therapy: Secondary | ICD-10-CM | POA: Insufficient documentation

## 2017-05-24 DIAGNOSIS — I110 Hypertensive heart disease with heart failure: Secondary | ICD-10-CM | POA: Insufficient documentation

## 2017-05-24 DIAGNOSIS — G4730 Sleep apnea, unspecified: Secondary | ICD-10-CM | POA: Insufficient documentation

## 2017-05-24 LAB — CBC WITH DIFFERENTIAL/PLATELET
Basophils Absolute: 0.1 10*3/uL (ref 0–0.1)
Basophils Relative: 1 %
Eosinophils Absolute: 0.1 10*3/uL (ref 0–0.7)
Eosinophils Relative: 1 %
HCT: 44.1 % (ref 40.0–52.0)
Hemoglobin: 14.3 g/dL (ref 13.0–18.0)
Lymphocytes Relative: 18 %
Lymphs Abs: 1.4 10*3/uL (ref 1.0–3.6)
MCH: 30.7 pg (ref 26.0–34.0)
MCHC: 32.4 g/dL (ref 32.0–36.0)
MCV: 94.7 fL (ref 80.0–100.0)
Monocytes Absolute: 0.7 10*3/uL (ref 0.2–1.0)
Monocytes Relative: 9 %
Neutro Abs: 5.6 10*3/uL (ref 1.4–6.5)
Neutrophils Relative %: 71 %
Platelets: 92 10*3/uL — ABNORMAL LOW (ref 150–400)
RBC: 4.66 MIL/uL (ref 4.40–5.90)
RDW: 14.7 % — ABNORMAL HIGH (ref 11.5–14.5)
WBC: 7.8 10*3/uL (ref 3.8–10.6)

## 2017-05-24 LAB — COMPREHENSIVE METABOLIC PANEL
ALT: 10 U/L — ABNORMAL LOW (ref 17–63)
AST: 26 U/L (ref 15–41)
Albumin: 3.5 g/dL (ref 3.5–5.0)
Alkaline Phosphatase: 85 U/L (ref 38–126)
Anion gap: 12 (ref 5–15)
BUN: 23 mg/dL — ABNORMAL HIGH (ref 6–20)
CO2: 22 mmol/L (ref 22–32)
Calcium: 8.8 mg/dL — ABNORMAL LOW (ref 8.9–10.3)
Chloride: 104 mmol/L (ref 101–111)
Creatinine, Ser: 1.05 mg/dL (ref 0.61–1.24)
GFR calc Af Amer: >60 mL/min (ref >60)
GFR calc non Af Amer: >60 mL/min (ref >60)
Glucose, Bld: 136 mg/dL — ABNORMAL HIGH (ref 65–99)
Potassium: 3.8 mmol/L (ref 3.5–5.1)
Sodium: 138 mmol/L (ref 135–145)
Total Bilirubin: 1.1 mg/dL (ref 0.3–1.2)
Total Protein: 6.4 g/dL — ABNORMAL LOW (ref 6.5–8.1)

## 2017-05-25 ENCOUNTER — Ambulatory Visit (INDEPENDENT_AMBULATORY_CARE_PROVIDER_SITE_OTHER): Payer: PPO | Admitting: Family Medicine

## 2017-05-25 ENCOUNTER — Ambulatory Visit (INDEPENDENT_AMBULATORY_CARE_PROVIDER_SITE_OTHER): Payer: PPO

## 2017-05-25 ENCOUNTER — Other Ambulatory Visit: Payer: Self-pay

## 2017-05-25 ENCOUNTER — Encounter: Payer: Self-pay | Admitting: Family Medicine

## 2017-05-25 VITALS — BP 130/63 | HR 60 | Resp 18 | Ht 67.13 in | Wt 268.8 lb

## 2017-05-25 DIAGNOSIS — S76011A Strain of muscle, fascia and tendon of right hip, initial encounter: Secondary | ICD-10-CM

## 2017-05-25 DIAGNOSIS — S79911A Unspecified injury of right hip, initial encounter: Secondary | ICD-10-CM | POA: Diagnosis not present

## 2017-05-25 DIAGNOSIS — M25551 Pain in right hip: Secondary | ICD-10-CM

## 2017-05-25 DIAGNOSIS — S5001XD Contusion of right elbow, subsequent encounter: Secondary | ICD-10-CM | POA: Diagnosis not present

## 2017-05-25 DIAGNOSIS — S51811D Laceration without foreign body of right forearm, subsequent encounter: Secondary | ICD-10-CM

## 2017-05-25 NOTE — Progress Notes (Signed)
Subjective:  By signing my name below, I, Andre [name], attest that this documentation has been prepared under the direction and in the presence of Andre [name].  Electronically Signed: Joslyn [name], ED Scribe 05/25/2017 at 2:50 PM.     Patient ID: Andre [name], male    DOB: 1935/12/11, 82 y.o.   MRN: [id number]   Chief Complaint  Patient presents with  . Wound Check    patient presents to have laceration on R forearm checked    HPI Andre [name] is a 82 y.o. male who presents to Primary Care at [facility] complaining of a wound. He is accompanied by his wife today.   1. He is here for skin tear and hematoma of right arm. See 2 prior visits. He had a fall on 05/11/17. He had contusion of right forearm, skin tear and hematoma at elbow. Treated initially at ED where his wound was cleansed and ultimately treated with Bactroban with dressing changes. Xray of elbow 1/281/9 no fracture, soft tissue contusion. He is on anticoagulants.   Patient notes his medication is working. He is changing his bandages everyday for his wound. He uses new medication at 7pm and will sleep with wound unwrapped and will put new bandage on it the next morning. He denies right arm pain but notes to pain in his neck. He notes the pain in his neck is unrelated to this current injury.   2. Notes new pain in upper right leg. He has trouble lifting right leg.         Patient Active Problem List   Diagnosis Date Noted  . Abrasion 02/08/2017  . Depression 01/05/2017  . Prediabetes 10/27/2016  . Hypothyroidism 10/27/2016  . (HFpEF) heart failure with preserved ejection fraction ([place]) 09/27/2016  . Parkinson's disease ([place]) 06/30/2016  . PAF (paroxysmal atrial fibrillation) ([place])   . BMI 40.0-44.9, adult ([place]) 05/06/2015  . Erythrocytosis 01/28/2015  . Atherosclerosis of abdominal aorta ([place]) 12/08/2014  . Barrett's esophagus 12/31/2013  . DDD (degenerative disc disease), cervical 10/01/2013  . DDD  (degenerative disc disease), lumbar 10/01/2013  . Coronary artery disease involving native coronary artery of native heart with angina pectoris ([place]) 10/30/2012  . GERD (gastroesophageal reflux disease) 10/30/2012  . Hyperlipidemia with target LDL less than 70 10/30/2012  . Essential hypertension 10/30/2012  . Allergic rhinitis 09/10/2012  . OSA (obstructive sleep apnea) 09/10/2012   Past Medical History:  Diagnosis Date  . Cervical spondylosis 10/01/2013  . Chronic diastolic CHF (congestive heart failure) ([place])    a. 07/2016 Echo: >55%; b. 10/2016 Echo: EF 55-60%, Gr1 DD, Ao sclerosis w/o stenosis, sev dil LA.  [name] Coronary artery disease    a. 1998 s/p mini-cabg @ [facility] - LIMA->LAD;  b. 07/2016 St Echo:  Inadequate HR (max 97) w/ hypertensive response (220/96). Ex time only 2:54 - stopped due to dyspnea and leg pain;  c.  08/2016 MV: EF 67%, no ischemia, low risk; d. 10/2016 NSTEMI/Cath: LM 40, LAD 100ost, RI 80, LCX nl, RCA 95p (4.0x26 Onyx DES), 12m, LIMA->LAD nl, EF 50-55%.  . Depression   . GERD (gastroesophageal reflux disease)   . Hyperlipidemia   . Hypertension   . PAF (paroxysmal atrial fibrillation) ([facility])    a. s/p DCCV-->maintaining sinus on amiodarone;  b. CHA2DS2VASc = 5-->eliquis.  . Parkinson's disease ([place])    tremors  . Pulmonary embolism ([place]) 2011  . 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 Sleep apnea    wears CPAP   Past Surgical  History:  Procedure Laterality Date  . BACK SURGERY  1960  . CARDIAC CATHETERIZATION    . CHOLECYSTECTOMY  2010  . CORONARY ARTERY BYPASS GRAFT  01/07/1997  . CORONARY STENT INTERVENTION N/A 10/31/2016   Procedure: Coronary Stent Intervention;  Surgeon: Wellington Hampshire, MD;  Location: [place] [facility];  Service: Cardiovascular;  Laterality: N/A;  . ELECTROPHYSIOLOGIC STUDY N/A 07/14/2015   Procedure: CARDIOVERSION;  Surgeon: Yolonda Kida, MD;  Location: [facility] ORS;  Service: Cardiovascular;  Laterality: N/A;  . ELECTROPHYSIOLOGIC STUDY  N/A 10/12/2015   Procedure: CARDIOVERSION;  Surgeon: Minna Merritts, MD;  Location: [facility] ORS;  Service: Cardiovascular;  Laterality: N/A;  . LEFT HEART CATH AND CORONARY ANGIOGRAPHY N/A 10/31/2016   Procedure: Left Heart Cath and Coronary Angiography;  Surgeon: Wellington Hampshire, MD;  Location: [place] [facility];  Service: Cardiovascular;  Laterality: N/A;  . OTHER SURGICAL HISTORY  1998   Bypass   Allergies  Allergen Reactions  . Pravastatin Other (See Comments)  . Prednisone Other (See Comments)    Pt states that med makes him hyper Pt states that med makes him hyper   Prior to Admission medications   Medication Sig Start Date End Date Taking? Authorizing Provider  acetaminophen (TYLENOL) 500 MG tablet Take 500 mg every 6 (six) hours as needed by mouth for mild pain or moderate pain.    Yes [provider]  amiodarone (PACERONE) 100 MG tablet Take 1 tablet (100 mg total) by mouth daily. 05/18/17  Yes Wellington Hampshire, MD  carbidopa-levodopa (SINEMET) 25-250 MG tablet Take 2 tablets by mouth 3 (three) times daily. 02/01/17  Yes Kathrynn Ducking, MD  carvedilol (COREG) 6.25 MG tablet TAKE ONE TABLET BY MOUTH TWICE DAILY WITH A MEAL 03/06/17  Yes Wellington Hampshire, MD  clopidogrel (PLAVIX) 75 MG tablet TAKE ONE TABLET BY MOUTH EVERY DAY WITH BREAKFAST 03/06/17  Yes Wellington Hampshire, MD  cyclobenzaprine (FLEXERIL) 5 MG tablet Take 5 mg by mouth 3 (three) times daily as needed for muscle spasms.   Yes [provider]  ELIQUIS 5 MG TABS tablet TAKE ONE TABLET TWICE DAILY 02/06/17  Yes Wellington Hampshire, MD  entacapone (COMTAN) 200 MG tablet Take 1 tablet (200 mg total) by mouth 3 (three) times daily. 03/29/17  Yes Kathrynn Ducking, MD  EPINEPHrine 0.3 mg/0.3 mL IJ SOAJ injection Inject 0.3 mg as directed once as needed (allergic reaction). Reported on 10/27/2015 07/01/13  Yes [provider]  esomeprazole (NEXIUM) 20 MG capsule Take 40 mg by mouth daily at 12 noon.    Yes [provider]  fluticasone (FLONASE) 50 MCG/ACT nasal spray TAKE 2 PUFFS IN EACH NOSTRIL EVERY DAY 10/18/16  Yes Cook, Jayce G, DO  furosemide (LASIX) 20 MG tablet Take 1 tablet (20 mg total) by mouth daily. 04/06/17 07/05/17 Yes Theora Gianotti, NP  Iodoquinol-HC-Aloe Polysacch (ALCORTIN A) 1-2-1 % GEL Apply 1 application topically daily. 12/08/16  Yes [provider]  isosorbide mononitrate (IMDUR) 60 MG 24 hr tablet Take 1 tablet (60 mg total) by mouth daily. 02/14/17  Yes Wellington Hampshire, MD  levothyroxine (SYNTHROID, LEVOTHROID) 50 MCG tablet 50 mcg daily before breakfast.  09/07/16  Yes [provider]  losartan (COZAAR) 100 MG tablet Take 1 tablet (100 mg total) by mouth daily. 02/14/17  Yes Wellington Hampshire, MD  mupirocin ointment (BACTROBAN) 2 % Apply 1 application topically daily. After cleaning wound. 05/19/17  Yes Wendie Agreste, MD  VENTOLIN HFA 108 (90 Base) MCG/ACT inhaler TAKE 2 PUFFS EVERY 8 HOURS AS NEEDED FORWHEEZING 12/02/16  Yes Cook, Jayce G, DO  rosuvastatin (CRESTOR) 10 MG tablet Take 1 tablet (10 mg total) by mouth daily. 02/14/17 05/15/17  Wellington Hampshire, MD   Social History   Socioeconomic History  . Marital status: Married    Spouse name: [name]  . Number of children: 1  . Years of education: 51  . Highest education level: Not on file  Social Needs  . Financial resource strain: Not on file  . Food insecurity - worry: Not on file  . Food insecurity - inability: Not on file  . Transportation needs - medical: Not on file  . Transportation needs - non-medical: Not on file  Occupational History  . Occupation: Retired    Comment: Designer, television/film set  Tobacco Use  . Smoking status: Former Smoker    Packs/day: 1.00    Years: 40.00    Pack years: 40.00    Types: Cigarettes, Pipe, Cigars    Last attempt to quit: 04/18/1972    Years since quitting: 45.1  . Smokeless tobacco: Former Systems developer    Types: [place] date:  04/18/1972  Substance and Sexual Activity  . Alcohol use: Yes    Alcohol/week: 2.4 oz    Types: 2 Cans of beer, 2 Shots of liquor per week    Comment: per 2 weeks   . Drug use: No  . Sexual activity: Yes  Other Topics Concern  . Not on file  Social History Narrative   Lives w/ wife   Caffeine use: none   Right-handed     Review of Systems  Musculoskeletal: Positive for neck pain.       Trouble lifting upper right leg.   Skin:       Healing wound and ecchymosis on right arm.  All other systems reviewed and are negative.      Objective:   Physical Exam  Constitutional: He is oriented to person, place, and time. He appears well-developed and well-nourished.  Cardiovascular: Normal rate.  Pulmonary/Chest: Effort normal.  Musculoskeletal:  Pain free complete ROM of right elbow, no bony tenderness of right arm.  Some discomfort with internal rotation and pain with some guarding with active hip flexion.  Pain into upper medial thigh. External rotation pain free. His pain with resistant flexion of hip, pain free adduction.  Pain free ROM of right knee  Neurological: He is alert and oriented to person, place, and time.  Skin:  Ecchymosis of medial elbow into the lower aspect of his right upper arm.  Abraded area of right dorsal forearm that appears to be healing well with some overlying scar tissue at area of skin tear, no surrounding erythema no adherent exudate and no bony tenderness.   Psychiatric: He has a normal mood and affect. His behavior is normal.     Vitals:   05/25/17 1420  BP: 130/63  Pulse: 60  Resp: 18  SpO2: 94%  Weight: 268 lb 12.8 oz (121.9 kg)  Height: 5' 7.13" (1.705 m)    Dg Hip Unilat W Or W/o Pelvis 2-3 Views Right  Result Date: 05/25/2017 CLINICAL DATA:  Right hip pain after fall 2 weeks ago. EXAM: DG HIP (WITH OR WITHOUT PELVIS) 2-3V RIGHT COMPARISON:  CT abdomen pelvis dated November 14, 2014. FINDINGS: No acute fracture or malalignment. Mild  bilateral superior hip joint space narrowing. Degenerative changes of the lower lumbar spine. Bone  mineralization is normal. Soft tissues are unremarkable. IMPRESSION: Mild bilateral hip osteoarthritis.  No acute osseous abnormality. Electronically Signed   By: Titus Dubin M.D.   On: 05/25/2017 15:32        Assessment & Plan:   AUDRY PECINA is a 82 y.o. male Skin tear of right forearm without complication, subsequent encounter  - Healing well without signs of infection. Continue symptomatic care  Contusion of right elbow, subsequent encounter  - Full range of motion, no bony tenderness. Hematoma with use of blood thinners. Should continue to improve, but RTC precautions discussed  Right hip pain - Plan: DG HIP UNILAT W OR W/O PELVIS 2-3 VIEWS RIGHT Strain of flexor muscle of right hip, initial encounter - Plan: DG HIP UNILAT W OR W/O PELVIS 2-3 VIEWS RIGHT  -No apparent avulsion or hip fracture on imaging. Based on exam likely has hip flexor strain versus component of adductor strain. Has planned follow-up with primary care provider next week. Can decide on physical therapy versus orthopedic eval if needed. Range of motion as tolerated for now   No orders of the defined types were placed in this encounter.  Patient Instructions    Skin tear appears to be healing well. Okay to continue the antibiotic ointment for the next 4-5 days, then just soap and water should be sufficient. Follow up with primary care provider as planned.   Bruising of arm should also improve in the next 2 weeks.  Hip pain is likely due to a strain or pull of the hip flexor muscle. Gentle range of motion as tolerated, and follow-up with primary care provider to decide if physical therapy or other evaluation is needed.  Return to the clinic or go to the nearest emergency room if any of your symptoms worsen or new symptoms occur.    IF you received an x-[name] today, you will receive an invoice from [facility]  Radiology. Please contact [facility] Radiology at [phone] with questions or concerns regarding your invoice.   IF you received labwork today, you will receive an invoice from [place] [facility]. Please contact [facility] at [phone] with questions or concerns regarding your invoice.   Our billing staff will not be able to assist you with questions regarding bills from these companies.  You will be contacted with the lab results as soon as they are available. The fastest way to get your results is to activate your My Chart account. Instructions are located on the last page of this paperwork. If you have not heard from [place] regarding the results in 2 weeks, please contact this office.      I personally performed the services described in this documentation, which was scribed in my presence. The recorded information has been reviewed and considered for accuracy and completeness, addended by me as needed, and agree with information above.  Signed,   Andre Ray, MD Primary Care at [place].  05/26/17 12:25 PM

## 2017-05-25 NOTE — Patient Instructions (Addendum)
  Skin tear appears to be healing well. Okay to continue the antibiotic ointment for the next 4-5 days, then just soap and water should be sufficient. Follow up with primary care provider as planned.   Bruising of arm should also improve in the next 2 weeks.  Hip pain is likely due to a strain or pull of the hip flexor muscle. Gentle range of motion as tolerated, and follow-up with primary care provider to decide if physical therapy or other evaluation is needed.  Return to the clinic or go to the nearest emergency room if any of your symptoms worsen or new symptoms occur.    IF you received an x-ray today, you will receive an invoice from [facility]. Please contact [facility] at [phone] with questions or concerns regarding your invoice.   IF you received labwork today, you will receive an invoice from [facility]. Please contact [facility] at [phone] with questions or concerns regarding your invoice.   Our billing staff will not be able to assist you with questions regarding bills from these companies.  You will be contacted with the lab results as soon as they are available. The fastest way to get your results is to activate your My Chart account. Instructions are located on the last page of this paperwork. If you have not heard from [place] regarding the results in 2 weeks, please contact this office.

## 2017-05-26 ENCOUNTER — Other Ambulatory Visit: Payer: PPO

## 2017-05-26 ENCOUNTER — Encounter: Payer: Self-pay | Admitting: Internal Medicine

## 2017-05-26 ENCOUNTER — Ambulatory Visit: Payer: PPO | Admitting: Internal Medicine

## 2017-05-26 VITALS — BP 128/66 | HR 71 | Resp 16 | Ht 67.8 in | Wt 268.0 lb

## 2017-05-26 DIAGNOSIS — J8410 Pulmonary fibrosis, unspecified: Secondary | ICD-10-CM

## 2017-05-26 DIAGNOSIS — G4733 Obstructive sleep apnea (adult) (pediatric): Secondary | ICD-10-CM | POA: Diagnosis not present

## 2017-05-26 NOTE — Patient Instructions (Signed)
Check AMBULATING PULSE OXIMETRY CT chest to assess for ILD

## 2017-05-26 NOTE — Progress Notes (Signed)
  [facility] Consultation      MRN# [id number] Andre [name] 06/10/1935   CC: Chief Complaint  Patient presents with  . Follow-up    abnormal PFT  follow up SOB    Brief History: 82 yo M former smoker, history of CAD/CABG/hypertension/allergies on immunotherapy/OSA on CPAP/prior PE, initially seen in consultation for dyspnea, PFT with mixed restriction and obstruction, currently following with pulmonary for OSA optimization.   PREVIOUS OV He was diagnosed with OSA in 2010, AHI= 52. Compliance report shows AHI 0.7, 100% compliance CPAP 12 cm h20  S/p cardiac stents placed   Quit tobacco abuse 40 years ago 6MWT was WNL at last visit   He is accompanied today by his wife.  No signs of infection at this time  Dizziness with ambulation Patient has increased WOB and SOB Intermittent wheezing    Review of Systems  Constitutional: Negative for malaise/fatigue.  Eyes: Negative for blurred vision.  Respiratory: Positive for shortness of breath and wheezing. Negative for cough, hemoptysis and sputum production.   Cardiovascular: Negative for chest pain.  Genitourinary: Negative for dysuria.  Musculoskeletal: Negative for myalgias.  Neurological: Negative for dizziness, weakness and headaches.  Endo/Heme/Allergies: Does not bruise/bleed easily.  Psychiatric/Behavioral: Negative for depression.      Allergies:  Pravastatin and Prednisone  Physical Examination:  Ht 5' 7.8" (1.722 m)   BMI 41.11 kg/m  BP 128/66 (BP Location: Left Arm, Cuff Size: Large)   Pulse 71   Resp 16   Ht 5' 7.8" (1.722 m)   Wt 268 lb (121.6 kg)   SpO2 95%   BMI 40.99 kg/m   General Appearance: +resp  distress  HEENT: PERRLA, no ptosis, no other lesions noticed Pulmonary:normal breath sounds., diaphragmatic excursion normal.No wheezing, +rales/crackles Cardiovascular:  Normal S1,S2.  No m/r/g.     Abdomen:Exam: Benign, Soft, non-tender, No masses  Skin:   warm, no  rashes, no ecchymosis  Extremities: +edema    Assessment and Plan:  82 year old male past medical history of obesity, obstructive sleep apnea, seen in follow-up visit for OSA optimization.  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 AHI=1.3 on 12cm H2O of CPAP, with 100% compliance at last OV  Plan: -CPAP 12cm H2O with full face mask  Obesity -recommend significant weight loss -recommend changing diet  Deconditioned state -Recommend increased daily activity and exercise as tolerated  Patient/Family are satisfied with Plan of action and management. All questions answered Follow up in 3 months  Andre [name], M.D.  [facility]  Medical Director [place] Director [facility] Cardio-Pulmonary Department

## 2017-05-29 ENCOUNTER — Encounter: Payer: Self-pay | Admitting: Family Medicine

## 2017-05-29 ENCOUNTER — Ambulatory Visit (INDEPENDENT_AMBULATORY_CARE_PROVIDER_SITE_OTHER): Payer: PPO | Admitting: Family Medicine

## 2017-05-29 DIAGNOSIS — R42 Dizziness and giddiness: Secondary | ICD-10-CM | POA: Insufficient documentation

## 2017-05-29 DIAGNOSIS — R0600 Dyspnea, unspecified: Secondary | ICD-10-CM

## 2017-05-29 DIAGNOSIS — W19XXXA Unspecified fall, initial encounter: Secondary | ICD-10-CM

## 2017-05-29 NOTE — Patient Instructions (Signed)
Nice to see you. Please keep your appointment for the CT scan. Please monitor your right leg and if it does not continue to improve please let us know. If your breathing gets worse or you become increasingly be evaluated.

## 2017-05-29 NOTE — Assessment & Plan Note (Signed)
Seems to be orthostatic in nature though orthostatics today were negative.  Could be medication related as he notes it did improve with decreasing his amiodarone dose.  Could be related to his Parkinson's.  He is having a CT scan of his chest for his dyspnea later this week and notes they may go down on the amiodarone even further after the scan.  Discussed seeing what plan comes after the CT scan.  Once that is completed we may need to have him see his neurologist in follow-up to see if his Parkinson's is contributing.  Advised to stay adequately hydrated though not overly hydrated given his heart failure history.  Also advised to stand slowly.

## 2017-05-29 NOTE — Progress Notes (Signed)
[name], MD Phone: [phone]  Andre [name] is a 82 y.o. male who presents today for follow-up.  Patient reports he was at [place] in late January when he went across the street and stepped off the curb too quickly and fell forward.  He reports striking his right elbow with an abrasion and his right knee with an abrasion.  Subsequently developed some right anterior thigh pain which has been evaluated with a hip x-ray at [facility] urgent care.  When he fell he was evaluated by EMTs and went to the emergency department and reports he had a CT scan of his head that was negative.  He notes the abrasion on his right arm has been healing.  He notes the discomfort in his right anterior thigh has been improving as well.  Hurts with flexion of his hip.  Overall he is improving.  Patient has been followed by cardiology and pulmonology for dyspnea that has been stable since he had a non-ST elevation MI last summer.  He had a stent placed at that time.  He had slight breathing abnormalities prior to that.  His amiodarone has been decreased.  They are planning on a CT scan of his chest to evaluate for interstitial lung disease later this week.  He notes no chest pain.  He has remained on Eliquis and Plavix.  He reports he has been intermittently getting lightheaded for some time now though worse over the last month.  This occurs when he goes to stand up.  Notes he tries to drink plenty of fluids.  He has not passed out.  No vertigo symptoms.  Recent labs with adequate renal function.  He has been taking his fluid pill and has had very minimal edema.  Social History   Tobacco Use  Smoking Status Former Smoker  . Packs/day: 1.00  . Years: 40.00  . Pack years: 40.00  . Types: Cigarettes, Pipe, Cigars  . Last attempt to quit: 04/18/1972  . Years since quitting: 45.1  Smokeless Tobacco Former Systems developer  . Types: Chew  . Quit date: 04/18/1972     ROS see history of present  illness  Objective  Physical Exam Vitals:   05/29/17 1418  BP: 138/62  Pulse: 64  Temp: (!) 97.5 F (36.4 C)  SpO2: 95%   Laying blood pressure 142/70 pulse 63 Sitting blood pressure 130/62 pulse 65 Standing blood pressure 130/62 pulse 66  BP Readings from Last 3 Encounters:  05/29/17 138/62  05/26/17 128/66  05/25/17 130/63   Wt Readings from Last 3 Encounters:  05/29/17 266 lb 9.6 oz (120.9 kg)  05/26/17 268 lb (121.6 kg)  05/25/17 268 lb 12.8 oz (121.9 kg)    Physical Exam  Constitutional: No distress.  Cardiovascular: Normal rate, regular rhythm and normal heart sounds.  Pulmonary/Chest: Effort normal and breath sounds normal.  Musculoskeletal: He exhibits no edema.  Neurological: He is alert. Gait normal.  Skin: Skin is warm and dry. He is not diaphoretic.  Healing abrasion with scab noted over right lateral elbow, scabs noted over right knee, no tenderness of the right anterior thigh, there is discomfort on flexion in the hip flexors on resisted flexion, 5/5 strength bilateral hip flexors, quads, hamstrings, plantar flexion, and dorsiflexion  No signs of infection at any of his abrasions   Assessment/Plan: Please see individual problem list.  Fall Patient status post fall after moving too quickly off of a curb.  Suspect patient's Parkinson's and lightheadedness possibly contributed.  Reports negative  CT head at an outside hospital.  He has had an x-ray there as well.  Also an x-ray of his right hip at [facility].  He appears to be healing well.  Notes the right anterior hip flexor discomfort has been improving.  Offered physical therapy or monitoring.  Patient opted to monitor.  If not improving we will have him do physical therapy.  Discussed being careful when moving around.  He will need to see neuro in follow-up.  Lightheadedness Seems to be orthostatic in nature though orthostatics today were negative.  Could be medication related as he notes it did improve with  decreasing his amiodarone dose.  Could be related to his Parkinson's.  He is having a CT scan of his chest for his dyspnea later this week and notes they may go down on the amiodarone even further after the scan.  Discussed seeing what plan comes after the CT scan.  Once that is completed we may need to have him see his neurologist in follow-up to see if his Parkinson's is contributing.  Advised to stay adequately hydrated though not overly hydrated given his heart failure history.  Also advised to stand slowly.  Dyspnea Chronic issue.  No acute changes.  Likely multifactorial.  Suspect obesity, heart failure, coronary artery disease, and pulmonary disease is contributing.  We will proceed with a CT scan later this week.  He will follow-up with pulmonology and cardiology.   No orders of the defined types were placed in this encounter.   No orders of the defined types were placed in this encounter.    [name], MD [place]

## 2017-05-29 NOTE — Assessment & Plan Note (Addendum)
Patient status post fall after moving too quickly off of a curb.  Suspect patient's Parkinson's and lightheadedness possibly contributed.  Reports negative CT head at an outside hospital.  He has had an x-ray there as well.  Also an x-ray of his right hip at [facility].  He appears to be healing well.  Notes the right anterior hip flexor discomfort has been improving.  Offered physical therapy or monitoring.  Patient opted to monitor.  If not improving we will have him do physical therapy.  Discussed being careful when moving around.  He will need to see neuro in follow-up.

## 2017-05-29 NOTE — Assessment & Plan Note (Signed)
Chronic issue.  No acute changes.  Likely multifactorial.  Suspect obesity, heart failure, coronary artery disease, and pulmonary disease is contributing.  We will proceed with a CT scan later this week.  He will follow-up with pulmonology and cardiology.

## 2017-05-30 ENCOUNTER — Ambulatory Visit
Admission: RE | Admit: 2017-05-30 | Discharge: 2017-05-30 | Disposition: A | Discharge status: Home / Self Care | Payer: PPO | Source: Ambulatory Visit | Attending: Internal Medicine | Admitting: Internal Medicine

## 2017-05-30 DIAGNOSIS — J8410 Pulmonary fibrosis, unspecified: Secondary | ICD-10-CM | POA: Insufficient documentation

## 2017-05-30 DIAGNOSIS — I2510 Atherosclerotic heart disease of native coronary artery without angina pectoris: Secondary | ICD-10-CM | POA: Diagnosis not present

## 2017-05-30 DIAGNOSIS — I700 Atherosclerosis of aorta: Secondary | ICD-10-CM | POA: Diagnosis not present

## 2017-05-30 DIAGNOSIS — J432 Centrilobular emphysema: Secondary | ICD-10-CM | POA: Insufficient documentation

## 2017-05-31 ENCOUNTER — Telehealth: Payer: Self-pay | Admitting: Internal Medicine

## 2017-05-31 DIAGNOSIS — L570 Actinic keratosis: Secondary | ICD-10-CM | POA: Diagnosis not present

## 2017-05-31 NOTE — Telephone Encounter (Signed)
Patient wife calling to see if CT results are ready

## 2017-06-01 NOTE — Telephone Encounter (Signed)
Patient calling to check on status of CT results Please call when ready

## 2017-06-01 NOTE — Telephone Encounter (Signed)
Pt and wife calling about CT results.

## 2017-06-02 ENCOUNTER — Ambulatory Visit: Payer: PPO

## 2017-06-02 ENCOUNTER — Telehealth: Payer: Self-pay | Admitting: Cardiovascular Disease

## 2017-06-02 NOTE — Telephone Encounter (Signed)
Spoke with patients wife per release form. She reports that they called with CT results and that Dr. Fletcher Anon was going to review those and determine if he should continue amiodarone or not. They report that patient still has breathing problems and he states that after his stent placement he was great for a short period of time but then he noticed breathing changes. Patient and wife reports that the amiodarone could be causing these problems and would like for him to review. Advised that I would route message over to Dr. Fletcher Anon for review and we would call back with any recommendations.

## 2017-06-02 NOTE — Telephone Encounter (Signed)
Pt wife calling asking if we may please give them a call back  they just received call from Dr Zoila Shutter office about CT results  Would like a call back

## 2017-06-02 NOTE — Telephone Encounter (Signed)
I have informed patient of his CT scan results he is to make an appointment with Dr. Fletcher Anon with cardiology for reassessing amiodarone usage

## 2017-06-04 ENCOUNTER — Telehealth: Payer: Self-pay | Admitting: Internal Medicine

## 2017-06-04 NOTE — Telephone Encounter (Signed)
Please inform pt that his platelets are slightly low at 92; which needs to be monitored closely with repeat cbc in 4 months as he is on blood thinners. Please order CBC in 36months.

## 2017-06-05 ENCOUNTER — Other Ambulatory Visit: Payer: Self-pay | Admitting: *Deleted

## 2017-06-05 ENCOUNTER — Telehealth: Payer: Self-pay | Admitting: *Deleted

## 2017-06-05 DIAGNOSIS — D696 Thrombocytopenia, unspecified: Secondary | ICD-10-CM

## 2017-06-05 NOTE — Telephone Encounter (Signed)
Let us stop amiodarone and monitor symptoms.  If he develops recurrent atrial fibrillation, we might have to use a different medication.

## 2017-06-05 NOTE — Telephone Encounter (Signed)
Patient and pt's wife informed that plt count has dropped slightly to 92. Will continue to monitor his labs. Pt given an apt for June 17'th at 1130 am for lab only.

## 2017-06-05 NOTE — Telephone Encounter (Signed)
-----   Message from Cammie Sickle, MD sent at 06/04/2017  3:27 PM EST ----- Please inform pt that his platelets are slightly low at 92; which needs to be monitored closely with repeat cbc in 4 months as he is on blood thinners. Please order CBC in 6months.

## 2017-06-06 NOTE — Telephone Encounter (Signed)
Spoke with patient and his wife by speaker phone and reviewed recommendations to discontinue amiodarone and to continue monitoring symptoms. They verbalized understanding with no further questions at this time. Confirmed upcoming appointments with Dr. Caryl Comes and Dr. Fletcher Anon. No other concerns voiced at this time.

## 2017-06-13 ENCOUNTER — Telehealth: Payer: Self-pay | Admitting: Neurology

## 2017-06-13 MED ORDER — SERTRALINE HCL 50 MG PO TABS: ORAL_TABLET | ORAL | 2 refills | Status: DC

## 2017-06-13 NOTE — Telephone Encounter (Signed)
I called the patient.  The patient has had problems with depression in the past, now he is having significant problems with anxiety and panic attacks.  He reports shortness of breath with these events.  He feels like he does not have any enjoyment in life, does not want to go out and be with people.  The patient will be placed back on Zoloft, 50 mg daily for 2 weeks and then go to 100 mg daily, he will call for any dose adjustments.

## 2017-06-15 ENCOUNTER — Ambulatory Visit: Payer: PPO | Admitting: Internal Medicine

## 2017-06-15 ENCOUNTER — Encounter: Payer: Self-pay | Admitting: Internal Medicine

## 2017-06-15 VITALS — BP 158/72 | HR 63 | Ht 67.0 in | Wt 268.8 lb

## 2017-06-15 DIAGNOSIS — I5030 Unspecified diastolic (congestive) heart failure: Secondary | ICD-10-CM

## 2017-06-15 DIAGNOSIS — I481 Persistent atrial fibrillation: Secondary | ICD-10-CM

## 2017-06-15 DIAGNOSIS — I4819 Other persistent atrial fibrillation: Secondary | ICD-10-CM

## 2017-06-15 NOTE — Progress Notes (Signed)
Patient Care Team: [name], MD as PCP - General (Family Medicine) Wellington Hampshire, MD as PCP - Cardiology (Cardiology)   HPI  Andre [name] is a 82 y.o. male Seen in follow-up for  atrial ibrillation. It had been unassociated with palpitations. He does undergone cardioversion 3/17 with rapid reversion and was subsequently treated with amiodarone. He has continued on this. Cardioversion done 6/17   He has hx of CAD and prior CABG    Sinus bradycardia has prompted down titration of rate controlling medications Echocardiogram 2/17 demonstrated normal left ventricular function mild left ventricular hypertrophy (1.3/1 cm) and left atrial enlargement  (5.0 cm  Echo 7/18 nl LV function with LAE (52/2.2/5)  7/18 nSTEMI >>LHC --LIMA patent    RCA 95>>0 DES    Ramus 80    LVEF 55%      Date Cr Hgb  7/18 1.24 14.8  2/19 1.05 14.3    He has had complaints of shortness of breath.  His amiodarone has been discontinued currently.  He has seen pulmonary who his nose were reviewed.  Restrictive disease was identified.  DLCO was not performed.  An O2 sat probe was used.  Unfortunately he did not know how to use it so he comes in today wanting something else.  Weight continues to be a problem.  He has some edema.  No chest pain.    Past Medical History:  Diagnosis Date  . Cervical spondylosis 10/01/2013  . Chronic diastolic CHF (congestive heart failure) ([place])    a. 07/2016 Echo: >55%; b. 10/2016 Echo: EF 55-60%, Gr1 DD, Ao sclerosis w/o stenosis, sev dil LA.  [name] Coronary artery disease    a. 1998 s/p mini-cabg @ [facility] - LIMA->LAD;  b. 07/2016 St Echo:  Inadequate HR (max 97) w/ hypertensive response (220/96). Ex time only 2:54 - stopped due to dyspnea and leg pain;  c.  08/2016 MV: EF 67%, no ischemia, low risk; d. 10/2016 NSTEMI/Cath: LM 40, LAD 100ost, RI 80, LCX nl, RCA 95p (4.0x26 Onyx DES), 26m, LIMA->LAD nl, EF 50-55%.  . Depression   . GERD (gastroesophageal reflux  disease)   . Hyperlipidemia   . Hypertension   . PAF (paroxysmal atrial fibrillation) ([facility])    a. s/p DCCV-->maintaining sinus on amiodarone;  b. CHA2DS2VASc = 5-->eliquis.  . Parkinson's disease ([place])    tremors  . Pulmonary embolism ([place]) 2011  . 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 Sleep apnea    wears CPAP    Past Surgical History:  Procedure Laterality Date  . BACK SURGERY  1960  . CARDIAC CATHETERIZATION    . CHOLECYSTECTOMY  2010  . CORONARY ARTERY BYPASS GRAFT  01/07/1997  . CORONARY STENT INTERVENTION N/A 10/31/2016   Procedure: Coronary Stent Intervention;  Surgeon: Wellington Hampshire, MD;  Location: [facility];  Service: Cardiovascular;  Laterality: N/A;  . ELECTROPHYSIOLOGIC STUDY N/A 07/14/2015   Procedure: CARDIOVERSION;  Surgeon: Yolonda Kida, MD;  Location: [facility] ORS;  Service: Cardiovascular;  Laterality: N/A;  . ELECTROPHYSIOLOGIC STUDY N/A 10/12/2015   Procedure: CARDIOVERSION;  Surgeon: Minna Merritts, MD;  Location: [facility] ORS;  Service: Cardiovascular;  Laterality: N/A;  . LEFT HEART CATH AND CORONARY ANGIOGRAPHY N/A 10/31/2016   Procedure: Left Heart Cath and Coronary Angiography;  Surgeon: Wellington Hampshire, MD;  Location: [place];  Service: Cardiovascular;  Laterality: N/A;  . OTHER SURGICAL HISTORY  1998   Bypass    Current Outpatient  Medications  Medication Sig Dispense Refill  . acetaminophen (TYLENOL) 500 MG tablet Take 500 mg every 6 (six) hours as needed by mouth for mild pain or moderate pain.     . carbidopa-levodopa (SINEMET) 25-250 MG tablet Take 2 tablets by mouth 3 (three) times daily. 540 tablet 3  . carvedilol (COREG) 6.25 MG tablet TAKE ONE TABLET BY MOUTH TWICE DAILY WITH A MEAL 60 tablet 3  . clopidogrel (PLAVIX) 75 MG tablet TAKE ONE TABLET BY MOUTH EVERY DAY WITH BREAKFAST 30 tablet 3  . cyclobenzaprine (FLEXERIL) 5 MG tablet Take 5 mg by mouth 3 (three) times daily as needed for muscle spasms.    [name] ELIQUIS 5 MG  TABS tablet TAKE ONE TABLET TWICE DAILY 60 tablet 6  . entacapone (COMTAN) 200 MG tablet Take 1 tablet (200 mg total) by mouth 3 (three) times daily. 90 tablet 3  . EPINEPHrine 0.3 mg/0.3 mL IJ SOAJ injection Inject 0.3 mg as directed once as needed (allergic reaction). Reported on 10/27/2015    . esomeprazole (NEXIUM) 20 MG capsule Take 40 mg by mouth daily at 12 noon.    . fluticasone (FLONASE) 50 MCG/ACT nasal spray TAKE 2 PUFFS IN EACH NOSTRIL EVERY DAY 16 g 3  . furosemide (LASIX) 20 MG tablet Take 1 tablet (20 mg total) by mouth daily. 90 tablet 3  . isosorbide mononitrate (IMDUR) 60 MG 24 hr tablet Take 1 tablet (60 mg total) by mouth daily. 90 tablet 3  . levothyroxine (SYNTHROID, LEVOTHROID) 50 MCG tablet 50 mcg daily before breakfast.     . losartan (COZAAR) 100 MG tablet Take 1 tablet (100 mg total) by mouth daily. 90 tablet 3  . rosuvastatin (CRESTOR) 10 MG tablet Take 1 tablet (10 mg total) by mouth daily. 90 tablet 3  . sertraline (ZOLOFT) 50 MG tablet 1 tablet daily for 2 weeks, then take 2 tablets daily 60 tablet 2  . VENTOLIN HFA 108 (90 Base) MCG/ACT inhaler TAKE 2 PUFFS EVERY 8 HOURS AS NEEDED FORWHEEZING 18 g 3   No current facility-administered medications for this visit.     Allergies  Allergen Reactions  . Pravastatin Other (See Comments)  . Prednisone Other (See Comments)    Pt states that med makes him hyper Pt states that med makes him hyper      Review of Systems negative except from HPI and PMH  Physical Exam BP (!) 158/72 (BP Location: Left Arm, Patient Position: Sitting, Cuff Size: Large)   Pulse 63   Ht 5\' 7"  (1.702 m)   Wt 268 lb 12 oz (121.9 kg)   BMI 42.09 kg/m  Well developed and Morbidly obese  in no acute distress HENT normal Neck supple with JVP-flat Carotids brisk and full without bruits Clear Regular rate and rhythm, no murmurs or gallops Abd-soft with active BS without hepatomegaly No Clubbing cyanosis edema Skin-warm and dry A &  Oriented  Grossly normal sensory and motor function   ECG demonstrates sinus at 63  21/11/38 axis left -60 Poor R wave progression Assessment and Plan:  Morbid obesity and sleep apnea  HFpEF  Atrial fibrillation-persistent  Depression  Dyspnea on exertion  Sinus bradycardia  Coronary artery disease with prior CABG and recent stenting   Without symptoms of ischemia   On Anticoagulation;  No bleeding issues   His dyspnea is certainly multifactorial.  Possibly contributing  Are obesity, poor conditioning, possibly some ischemia, HFpEF, amiodarone lung toxicity, restrictive lung disease as noted on PFTs  We reviewed the use of his O2 sat probe.  The hope would be that he could clarify for [place] heart rate excursion as well as oxygen saturation when he becomes dyspneic which is at less than about 30 or 40 feet.  For both him and his wife this also contributes to difficulty exercising.  I have suggested that he start walking around the house.  There is a hallway about 40 feet long; he can walk it 5 times a day and then every week add 10 feet.  He will call us next week with his oxygen saturation heart rate information.  No evidence of atrial fibrillation but rapid rates and probably will indicate its recurrence

## 2017-06-15 NOTE — Patient Instructions (Signed)
Medication Instructions: - Your physician recommends that you continue on your current medications as directed. Please refer to the Current Medication list given to you today.  Labwork: - none ordered  Procedures/Testing: - none ordered  Follow-Up: - Dr. Caryl Comes will see you back on an as needed basis.  Any Additional Special Instructions Will Be Listed Below (If Applicable). - when you feel short of breath monitor your heart rate/ blood pressure when walking around.     If you need a refill on your cardiac medications before your next appointment, please call your pharmacy.

## 2017-06-16 DIAGNOSIS — M4802 Spinal stenosis, cervical region: Secondary | ICD-10-CM | POA: Diagnosis not present

## 2017-06-16 DIAGNOSIS — M5030 Other cervical disc degeneration, unspecified cervical region: Secondary | ICD-10-CM | POA: Diagnosis not present

## 2017-06-16 DIAGNOSIS — M5412 Radiculopathy, cervical region: Secondary | ICD-10-CM | POA: Diagnosis not present

## 2017-06-19 ENCOUNTER — Ambulatory Visit (INDEPENDENT_AMBULATORY_CARE_PROVIDER_SITE_OTHER): Payer: PPO | Admitting: Family Medicine

## 2017-06-19 ENCOUNTER — Encounter: Payer: Self-pay | Admitting: Family Medicine

## 2017-06-19 DIAGNOSIS — R42 Dizziness and giddiness: Secondary | ICD-10-CM | POA: Diagnosis not present

## 2017-06-19 DIAGNOSIS — F329 Major depressive disorder, single episode, unspecified: Secondary | ICD-10-CM | POA: Diagnosis not present

## 2017-06-19 DIAGNOSIS — F32A Depression, unspecified: Secondary | ICD-10-CM

## 2017-06-19 DIAGNOSIS — N289 Disorder of kidney and ureter, unspecified: Secondary | ICD-10-CM

## 2017-06-19 DIAGNOSIS — R0600 Dyspnea, unspecified: Secondary | ICD-10-CM

## 2017-06-19 DIAGNOSIS — F419 Anxiety disorder, unspecified: Secondary | ICD-10-CM | POA: Diagnosis not present

## 2017-06-19 NOTE — Patient Instructions (Signed)
Nice to see you. I would continue with the Zoloft 50 mg for now.  I would hold off on increasing this until you are seen again by myself or your neurologist. We will try to get you plugged in with Dr. Fletcher Anon for recheck sooner than your scheduled appointment. If your breathing worsens or you develop chest pain or persistent lightheadedness please be evaluated immediately.

## 2017-06-19 NOTE — Progress Notes (Signed)
[name], MD Phone: [phone]  Andre [name] is a 82 y.o. male who presents today for f/u.  Patient continues to have issues with intermittent lightheadedness.  This has been going on for months and has been evaluated on several occasions for this with no obvious cause.  He continues to have chronic shortness of breath that is unchanged.  No acute changes.  He recovers very quickly.  He recently underwent CT scan with pulmonology that revealed pulmonary fibrosis and findings suggestive of asbestosis.  He had an apparent low-attenuation mass off of his left kidney.  His cardiologist appears to have advised him to stop his amiodarone though the patient and his wife both state they heard that he was supposed to decrease it to 100 mg.  They want me to clarify with his cardiologist.  He notes his oxygen has been maintaining fairly good at home though on one occasion did drop to 88% after ambulating.  He has seen his neurologist and they placed him on Zoloft for anxiety and depression.  His anxiety has been worse recently relating to not knowing what is going on with him.  He does note some thoughts that he might be better off dead though he has no plan or intent to harm himself.  He has been on Zoloft for about a week and is hesitant to go up on it as he was advised by his neurologist.  Social History   Tobacco Use  Smoking Status Former Smoker  . Packs/day: 1.00  . Years: 40.00  . Pack years: 40.00  . Types: Cigarettes, Pipe, Cigars  . Last attempt to quit: 04/18/1972  . Years since quitting: 45.2  Smokeless Tobacco Former Systems developer  . Types: Chew  . Quit date: 04/18/1972     ROS see history of present illness  Objective  Physical Exam Vitals:   06/19/17 1446  BP: (!) 146/83  Pulse: (!) 55  Temp: (!) 97.4 F (36.3 C)  SpO2: 94%    BP Readings from Last 3 Encounters:  06/19/17 (!) 146/83  06/15/17 (!) 158/72  05/29/17 138/62   Wt Readings from Last 3 Encounters:  06/19/17  266 lb (120.7 kg)  06/15/17 268 lb 12 oz (121.9 kg)  05/29/17 266 lb 9.6 oz (120.9 kg)   Laying blood pressure 146/81 pulse 57 Sitting blood pressure 146/83 pulse 56 Standing blood pressure 135/82 pulse 57  Ambulatory oxygen saturation 92%   Physical Exam  Constitutional: No distress.  Cardiovascular: Normal rate, regular rhythm and normal heart sounds.  Pulmonary/Chest: Effort normal and breath sounds normal.  Musculoskeletal: He exhibits no edema.  Neurological: He is alert. Gait normal.  Skin: Skin is warm and dry. He is not diaphoretic.     Assessment/Plan: Please see individual problem list.  Dyspnea Chronic issue.  He has not had any acute changes.  Likely multifactorial with obesity, heart failure, coronary artery disease, and pulmonary fibrosis playing a role.  I will check with his cardiologist to ensure that his amiodarone should be stopped which I do believe it should be based on prior notes though the patient wanted me to check.  He is given return precautions.  Anxiety and depression Somewhat worsened recently.  He is back on 50 mg of Zoloft.  Discussed Maintaining on this dose for now and then following up to consider increase of dose.  He is given return precautions.  Lightheadedness Remains stable.  Possibly multifactorial with cardiac cause versus medication cause versus Parkinson's contributing.  Discussed continuing  to monitor and following with cardiology as well as neurology.   No orders of the defined types were placed in this encounter.   No orders of the defined types were placed in this encounter.    [name], MD [place]

## 2017-06-21 ENCOUNTER — Telehealth: Payer: Self-pay | Admitting: Cardiovascular Disease

## 2017-06-21 NOTE — Telephone Encounter (Signed)
Pt c/o medication issue:  1. Name of Medication: Amiodarone   2. How are you currently taking this medication (dosage and times per day)? Not taking  3. Are you having a reaction (difficulty breathing--STAT)? No   4. What is your medication issue? Patient wife was told to stop med and doesn't remember how when or why please call to clarify

## 2017-06-21 NOTE — Telephone Encounter (Signed)
I called and spoke with the patient and his wife regarding amiodarone. I advised them that Dr. Fletcher Anon had recommended that he stop amiodarone on 06/05/17 due to his SOB. Our office spoke with them on 06/06/17 to advise them of these recommendations. The patient's wife said she just couldn't recall who/ when they were told to do this.   The patient's SOB is unchanged at this time. He has been on amiodarone almost 2 years. I advised the patient and his wife that it can take about 3 months for amiodarone to completely clear your system. He was seen by Dr. Caryl Comes on 06/15/17 and was in NSR w/ a HR of 63. Dr. Caryl Comes had instructed him to follow his HR/ O2 sats with his pulse oximeter while walking. Per the patient and his wife, they have not been recording these, but will start to do so. He is currently sitting and his HR is 59 bpm/ O2 sat- 94%.   Per the patient and his wife, they saw Dr. [name] on 06/19/17 and he was concerned about the patient's SOB.  I have moved his appointment up to see Dr. Fletcher Anon on 07/13/17. They are agreeable with this appointment.  I have advised that they call the office back prior to that if SOB worsens or he notices that his HR's are mid to upper 100's with walking. The patients voices understanding.

## 2017-06-22 NOTE — Assessment & Plan Note (Signed)
Remains stable.  Possibly multifactorial with cardiac cause versus medication cause versus Parkinson's contributing.  Discussed continuing to monitor and following with cardiology as well as neurology.

## 2017-06-22 NOTE — Assessment & Plan Note (Signed)
Chronic issue.  He has not had any acute changes.  Likely multifactorial with obesity, heart failure, coronary artery disease, and pulmonary fibrosis playing a role.  I will check with his cardiologist to ensure that his amiodarone should be stopped which I do believe it should be based on prior notes though the patient wanted me to check.  He is given return precautions.

## 2017-06-22 NOTE — Assessment & Plan Note (Signed)
Somewhat worsened recently.  He is back on 50 mg of Zoloft.  Discussed Maintaining on this dose for now and then following up to consider increase of dose.  He is given return precautions.

## 2017-06-25 ENCOUNTER — Telehealth: Payer: Self-pay | Admitting: Family Medicine

## 2017-06-25 NOTE — Telephone Encounter (Signed)
Spoke with patient's wife regarding the amiodarone.  She notes she had already discontinued it just did not remember that.  She notes they got his appointment with Dr. Velva Harman moved up as well.

## 2017-06-25 NOTE — Telephone Encounter (Signed)
-----   Message from Wellington Hampshire, MD sent at 06/23/2017  7:35 AM EST ----- Hi Aaronjames Kelsay, Yes, we told him to stop Amiodarone due to concerns about possible lung toxicity related to Amiodarone.  Thanks.   MA  ----- Message ----- From: [name], MD Sent: 06/22/2017   2:07 PM To: Wellington Hampshire, MD  Hi Dr Fletcher Anon,   I saw Mr Leeds earlier this week and he noted that he was still taking the amiodarone 100 mg daily. It looks like you advised him to discontinue this and I relayed this to him, though he wanted me to confirm this with you prior to him stopping this. I wanted to make sure this was correct and I will then relay to him that he should discontinue the amiodarone. Thanks.  [name]

## 2017-07-05 ENCOUNTER — Other Ambulatory Visit: Payer: Self-pay | Admitting: Cardiovascular Disease

## 2017-07-08 DIAGNOSIS — N289 Disorder of kidney and ureter, unspecified: Secondary | ICD-10-CM | POA: Insufficient documentation

## 2017-07-08 NOTE — Assessment & Plan Note (Signed)
Noted on CT chest. Prior CT abdomen and pelvis with large renal cyst. Noted after review of chart after patients visit that we did not discuss follow-up on this. I will have Janett Billow call the patient to discuss further imaging to determine if this is a lesion that needs to see urology.

## 2017-07-10 ENCOUNTER — Telehealth: Payer: Self-pay | Admitting: Neurology

## 2017-07-10 NOTE — Telephone Encounter (Signed)
[name] calling to discuss Neutron patches for Parkinson's since he has gotten worse. Patient uses [facility] in [place].

## 2017-07-10 NOTE — Telephone Encounter (Signed)
Pt wife inquiring about neupro patches

## 2017-07-10 NOTE — Telephone Encounter (Signed)
I called the patient.  The patient is having some nausea with the Sinemet dosing.  He is also having a wearing off effect before the next dose.  He was asking about the Neupro patch, I do not think this will be as effective as the Sinemet itself.  The patient will need to take the Sinemet about 45 minutes after he eats to help prevent nausea.  We may need to add Comtan to the Sinemet if he is having a wearing off effect.  I do not see a revisit on schedule, we will need to get something set up in the next 3 weeks or so.

## 2017-07-11 ENCOUNTER — Telehealth: Payer: Self-pay

## 2017-07-11 DIAGNOSIS — N281 Cyst of kidney, acquired: Secondary | ICD-10-CM

## 2017-07-11 NOTE — Progress Notes (Signed)
Patient states he would like this scheduled

## 2017-07-11 NOTE — Telephone Encounter (Signed)
Noted, thank you

## 2017-07-11 NOTE — Telephone Encounter (Signed)
Spoke with patient regarding his concerns.  Discussed that there was previously a renal cyst seen on the left side and that the finding on his most recent CT scan of his chest is likely related to that as well though given that they did not fully characterize it I would like to order an ultrasound to evaluate further.  This reassured him.  We will get him set up for renal ultrasound.

## 2017-07-11 NOTE — Telephone Encounter (Signed)
Scheduled patient 07-24-17 at 12pm, check in at 11:30am with Dr. Jannifer Franklin for a follow up appointment.

## 2017-07-11 NOTE — Telephone Encounter (Signed)
Called, LVM for pt to call and make f/u appt per CW,MD request  If he calls, can offer 07/24/17 at 12pm, check in 1130am

## 2017-07-11 NOTE — Telephone Encounter (Signed)
Patients wife came in today and states her husband is worried because we called this morning informing him that he had a possible renal cyst from a CT abdomen and pelvis scan. Patient would like for you to call him.

## 2017-07-12 NOTE — Telephone Encounter (Addendum)
Wife calling to request pt get the CT scan next week, week of April 1  They will be out of town after the 8th for several weeks.  Wife states ok to leave message on her phone. Pt will not remember if you call him.

## 2017-07-12 NOTE — Telephone Encounter (Signed)
Ultrasound ordered yesterday.  I will forward to Melissa to get this set up for next week.

## 2017-07-12 NOTE — Telephone Encounter (Signed)
fyi

## 2017-07-13 ENCOUNTER — Encounter: Payer: Self-pay | Admitting: Cardiovascular Disease

## 2017-07-13 ENCOUNTER — Ambulatory Visit: Payer: PPO | Admitting: Cardiovascular Disease

## 2017-07-13 VITALS — BP 140/70 | HR 58 | Ht 66.0 in | Wt 268.0 lb

## 2017-07-13 DIAGNOSIS — R0602 Shortness of breath: Secondary | ICD-10-CM

## 2017-07-13 DIAGNOSIS — I25118 Atherosclerotic heart disease of native coronary artery with other forms of angina pectoris: Secondary | ICD-10-CM | POA: Diagnosis not present

## 2017-07-13 DIAGNOSIS — I10 Essential (primary) hypertension: Secondary | ICD-10-CM

## 2017-07-13 DIAGNOSIS — I481 Persistent atrial fibrillation: Secondary | ICD-10-CM | POA: Diagnosis not present

## 2017-07-13 DIAGNOSIS — E785 Hyperlipidemia, unspecified: Secondary | ICD-10-CM | POA: Diagnosis not present

## 2017-07-13 DIAGNOSIS — I4819 Other persistent atrial fibrillation: Secondary | ICD-10-CM

## 2017-07-13 NOTE — Progress Notes (Signed)
Cardiology Office Note   Date:  07/13/2017   ID:  Andre [name], DOB 09-03-1935, MRN [id number]  PCP:  [name], MD  Cardiologist:   [name], MD   Chief Complaint  Patient presents with  . Other    4 month follow up. Patient c/o SOB.   Meds reviewed verbally with patient.       History of Present Illness: Andre [name] is a 82 y.o. male who presents for a follow-up visit regarding coronary artery disease.   He has known history of coronary artery disease status post 1 vessel CABG with LIMA to LAD in 1998, hypertension, hyperlipidemia, chronic diastolic heart aure, paroxysmal atrial fibrillation and morbid obesity. He also has history of pulmonary embolism and Parkinson's He was hospitalized in July of 2018 with a small non-ST elevation myocardial infarction. Echocardiogram showed normal LV systolic function with severely dilated left atrium. Cardiac catheterization showed occluded native LAD with patent LIMA, severe proximal RCA stenosis with heavy thrombus and moderate distal left main stenosis with significant ostial stenosis in the ramus branch. EF was 50-55% with mildly elevated left ventricular end-diastolic pressure. I performed successful angioplasty and drug-eluting stent placement to the right coronary artery which was complicated by embolization into a large RV branch at the lesion site. This was treated with balloon angioplasty which established TIMI 1 flow.  He had worsening shortness of breath recently thought to be multifactorial due to chronic diastolic heart failure, lung disease, physical deconditioning and possible allergies.  He was seen by Dr. Mortimer Fries .  Pulmonary function testing was suggestive of restrictive physiology and CT scan showed early pulmonary fibrosis and COPD.  Due to these changes, we elected to discontinue amiodarone.  He has not had recurrent atrial fibrillation since then but he only stopped the medication about 2 weeks ago.  He continues  to complain of shortness of breath with minimal activities without chest pain.  Past Medical History:  Diagnosis Date  . Cervical spondylosis 10/01/2013  . Chronic diastolic CHF (congestive heart failure) ([place])    a. 07/2016 Echo: >55%; b. 10/2016 Echo: EF 55-60%, Gr1 DD, Ao sclerosis w/o stenosis, sev dil LA.  [name] Coronary artery disease    a. 1998 s/p mini-cabg @ [facility] - LIMA->LAD;  b. 07/2016 St Echo:  Inadequate HR (max 97) w/ hypertensive response (220/96). Ex time only 2:54 - stopped due to dyspnea and leg pain;  c.  08/2016 MV: EF 67%, no ischemia, low risk; d. 10/2016 NSTEMI/Cath: LM 40, LAD 100ost, RI 80, LCX nl, RCA 95p (4.0x26 Onyx DES), 81m, LIMA->LAD nl, EF 50-55%.  . Depression   . GERD (gastroesophageal reflux disease)   . Hyperlipidemia   . Hypertension   . PAF (paroxysmal atrial fibrillation) ([facility])    a. s/p DCCV-->maintaining sinus on amiodarone;  b. CHA2DS2VASc = 5-->eliquis.  . Parkinson's disease ([place])    tremors  . Pulmonary embolism ([place]) 2011  . 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 Sleep apnea    wears CPAP    Past Surgical History:  Procedure Laterality Date  . BACK SURGERY  1960  . CARDIAC CATHETERIZATION    . CHOLECYSTECTOMY  2010  . CORONARY ARTERY BYPASS GRAFT  01/07/1997  . CORONARY STENT INTERVENTION N/A 10/31/2016   Procedure: Coronary Stent Intervention;  Surgeon: Wellington Hampshire, MD;  Location: [place] [facility];  Service: Cardiovascular;  Laterality: N/A;  . ELECTROPHYSIOLOGIC STUDY N/A 07/14/2015   Procedure: CARDIOVERSION;  Surgeon: Yolonda Kida, MD;  Location: [facility];  Service: Cardiovascular;  Laterality: N/A;  . ELECTROPHYSIOLOGIC STUDY N/A 10/12/2015   Procedure: CARDIOVERSION;  Surgeon: Minna Merritts, MD;  Location: [facility];  Service: Cardiovascular;  Laterality: N/A;  . LEFT HEART CATH AND CORONARY ANGIOGRAPHY N/A 10/31/2016   Procedure: Left Heart Cath and Coronary Angiography;  Surgeon: Wellington Hampshire, MD;  Location: [place] [facility];  Service: Cardiovascular;  Laterality: N/A;  . OTHER SURGICAL HISTORY  1998   Bypass     Current Outpatient Medications  Medication Sig Dispense Refill  . acetaminophen (TYLENOL) 500 MG tablet Take 500 mg every 6 (six) hours as needed by mouth for mild pain or moderate pain.     . carbidopa-levodopa (SINEMET) 25-250 MG tablet Take 2 tablets by mouth 3 (three) times daily. 540 tablet 3  . carvedilol (COREG) 6.25 MG tablet TAKE ONE TABLET BY MOUTH TWICE DAILY WITH A MEAL 60 tablet 3  . clopidogrel (PLAVIX) 75 MG tablet TAKE ONE TABLET BY MOUTH EVERY DAY WITH BREAKFAST 30 tablet 3  . cyclobenzaprine (FLEXERIL) 5 MG tablet Take 5 mg by mouth 3 (three) times daily as needed for muscle spasms.    [name] ELIQUIS 5 MG TABS tablet TAKE ONE TABLET TWICE DAILY 60 tablet 6  . entacapone (COMTAN) 200 MG tablet Take 1 tablet (200 mg total) by mouth 3 (three) times daily. 90 tablet 3  . EPINEPHrine 0.3 mg/0.3 mL IJ SOAJ injection Inject 0.3 mg as directed once as needed (allergic reaction). Reported on 10/27/2015    . esomeprazole (NEXIUM) 20 MG capsule Take 40 mg by mouth daily at 12 noon.    . fluticasone (FLONASE) 50 MCG/ACT nasal spray TAKE 2 PUFFS IN EACH NOSTRIL EVERY DAY 16 g 3  . HYDROcodone-acetaminophen (NORCO/VICODIN) 5-325 MG tablet     . isosorbide mononitrate (IMDUR) 60 MG 24 hr tablet Take 1 tablet (60 mg total) by mouth daily. 90 tablet 3  . levothyroxine (SYNTHROID, LEVOTHROID) 50 MCG tablet 50 mcg daily before breakfast.     . losartan (COZAAR) 100 MG tablet Take 1 tablet (100 mg total) by mouth daily. 90 tablet 3  . mirabegron ER (MYRBETRIQ) 50 MG TB24 tablet Take 50 mg by mouth daily.    . sertraline (ZOLOFT) 50 MG tablet 1 tablet daily for 2 weeks, then take 2 tablets daily 60 tablet 2  . VENTOLIN HFA 108 (90 Base) MCG/ACT inhaler TAKE 2 PUFFS EVERY 8 HOURS AS NEEDED FORWHEEZING 18 g 3  . furosemide (LASIX) 20 MG tablet Take 1 tablet (20 mg total) by mouth daily. 90 tablet 3   . rosuvastatin (CRESTOR) 10 MG tablet Take 1 tablet (10 mg total) by mouth daily. 90 tablet 3   No current facility-administered medications for this visit.     Allergies:   Pravastatin and Prednisone    Social History:  The patient  reports that he quit smoking about 45 years ago. His smoking use included cigarettes, pipe, and cigars. He has a 40.00 pack-year smoking history. He quit smokeless tobacco use about 45 years ago. His smokeless tobacco use included chew. He reports that he drinks about 2.4 oz of alcohol per week. He reports that he does not use drugs.   Family History:  The patient's Family history is unknown by patient.    ROS:  Please see the history of present illness.   Otherwise, review of systems are positive for none.   All other systems are reviewed and negative.  PHYSICAL EXAM: VS:  BP 140/70 (BP Location: Left Arm, Patient Position: Sitting, Cuff Size: Normal)   Pulse (!) 58   Ht 5\' 6"  (1.676 m)   Wt 268 lb (121.6 kg)   BMI 43.26 kg/m  , BMI Body mass index is 43.26 kg/m. GEN: Well nourished, well developed, in no acute distress  HEENT: normal  Neck: no JVD, carotid bruits, or masses Cardiac: RRR; no murmurs, rubs, or gallops, trace bilateral leg edema Respiratory:  clear to auscultation bilaterally with mildly diminished breath sounds, normal work of breathing GI: soft, nontender, nondistended, + BS MS: no deformity or atrophy  Skin: warm and dry, no rash Neuro:  Strength and sensation are intact Psych: euthymic mood, full affect  EKG:  EKG is ordered today. The ekg ordered today demonstrates normal sinus rhythm with nonspecific ST and T wave changes.   Recent Labs: 10/27/2016: TSH 3.31 05/24/2017: ALT 10; BUN 23; Creatinine, Ser 1.05; Hemoglobin 14.3; Platelets 92; Potassium 3.8; Sodium 138    Lipid Panel    Component Value Date/Time   CHOL 183 10/29/2016 0336   TRIG 229 (H) 10/29/2016 0336   HDL 45 10/29/2016 0336   CHOLHDL 4.1 10/29/2016  0336   VLDL 46 (H) 10/29/2016 0336   LDLCALC 92 10/29/2016 0336      Wt Readings from Last 3 Encounters:  07/13/17 268 lb (121.6 kg)  06/19/17 266 lb (120.7 kg)  06/15/17 268 lb 12 oz (121.9 kg)       No flowsheet data found.    ASSESSMENT AND PLAN:  1.coronary artery disease involving native coronary arteries with other forms of angina: Overall, he is doing reasonably well with stable symptoms.  Continue medical therapy.  He has residual disease affecting the ramus artery.  However, I am not convinced that this is the culprit for his shortness of breath which seems to be multifactorial.  Given his age and comorbidities, I am going to continue with medical therapy for now.  I am planning to use Plavix until July 2019 and then stop the medication given that he is on Eliquis.  Low-dose aspirin can be added at that time.  2. Atrial fibrillation: Maintaining in sinus rhythm.  Amiodarone was discontinued recently due to concerns about possible lung toxicity as noted on pulmonary function testing and CT scan which was suggestive of early fibrosis.  He is at high risk for recurrent atrial fibrillation.  If he develops atrial fibrillation, options include rate control or using a different antiarrhythmic medication. Continue anticoagulation with Eliquis.  3. Essential hypertension: Blood pressure is reasonably controlled.  4. Hyperlipidemia: He had myalgia with atorvastatin but he is tolerating rosuvastatin.  5.  Shortness of breath: I suspect this is likely multifactorial due to chronic diastolic heart failure, lung disease, physical deconditioning and possible amiodarone induced lung toxicity.  I am going to repeat his echocardiogram to evaluate his pulmonary pressure.   Disposition:   FU with me in 4 months  Signed,  [name], MD  07/13/2017 3:52 PM    [place]

## 2017-07-13 NOTE — Patient Instructions (Addendum)
Medication Instructions:  Your physician recommends that you continue on your current medications as directed. Please refer to the Current Medication list given to you today.   Labwork: none  Testing/Procedures: Your physician has requested that you have an echocardiogram. Echocardiography is a painless test that uses sound waves to create images of your heart. It provides your doctor with information about the size and shape of your heart and how well your heart's chambers and valves are working. This procedure takes approximately one hour. There are no restrictions for this procedure.    Follow-Up: Your physician wants you to follow-up in: 4 months with Dr. Fletcher Anon.  You will receive a reminder letter in the mail two months in advance. If you don't receive a letter, please call our office to schedule the follow-up appointment.   Any Other Special Instructions Will Be Listed Below (If Applicable).     If you need a refill on your cardiac medications before your next appointment, please call your pharmacy.  Echocardiogram An echocardiogram, or echocardiography, uses sound waves (ultrasound) to produce an image of your heart. The echocardiogram is simple, painless, obtained within a short period of time, and offers valuable information to your health care provider. The images from an echocardiogram can provide information such as:  Evidence of coronary artery disease (CAD).  Heart size.  Heart muscle function.  Heart valve function.  Aneurysm detection.  Evidence of a past heart attack.  Fluid buildup around the heart.  Heart muscle thickening.  Assess heart valve function.  Tell a health care provider about:  Any allergies you have.  All medicines you are taking, including vitamins, herbs, eye drops, creams, and over-the-counter medicines.  Any problems you or family members have had with anesthetic medicines.  Any blood disorders you have.  Any surgeries you have  had.  Any medical conditions you have.  Whether you are pregnant or may be pregnant. What happens before the procedure? No special preparation is needed. Eat and drink normally. What happens during the procedure?  In order to produce an image of your heart, gel will be applied to your chest and a wand-like tool (transducer) will be moved over your chest. The gel will help transmit the sound waves from the transducer. The sound waves will harmlessly bounce off your heart to allow the heart images to be captured in real-time motion. These images will then be recorded.  You may need an IV to receive a medicine that improves the quality of the pictures. What happens after the procedure? You may return to your normal schedule including diet, activities, and medicines, unless your health care provider tells you otherwise. This information is not intended to replace advice given to you by your health care provider. Make sure you discuss any questions you have with your health care provider. Document Released: 04/01/2000 Document Revised: 11/21/2015 Document Reviewed: 12/10/2012 Elsevier Interactive Patient Education  2017 [employer].

## 2017-07-20 ENCOUNTER — Ambulatory Visit
Admission: RE | Admit: 2017-07-20 | Discharge: 2017-07-20 | Disposition: A | Discharge status: Home / Self Care | Payer: PPO | Source: Ambulatory Visit | Attending: Family Medicine | Admitting: Family Medicine

## 2017-07-20 DIAGNOSIS — N281 Cyst of kidney, acquired: Secondary | ICD-10-CM | POA: Insufficient documentation

## 2017-07-22 ENCOUNTER — Other Ambulatory Visit: Payer: Self-pay | Admitting: Cardiovascular Disease

## 2017-07-24 ENCOUNTER — Encounter: Payer: Self-pay | Admitting: Neurology

## 2017-07-24 ENCOUNTER — Ambulatory Visit (INDEPENDENT_AMBULATORY_CARE_PROVIDER_SITE_OTHER): Payer: PPO | Admitting: Neurology

## 2017-07-24 VITALS — BP 189/88 | HR 64 | Ht 66.0 in | Wt 271.5 lb

## 2017-07-24 DIAGNOSIS — G20 Parkinson's disease: Secondary | ICD-10-CM | POA: Diagnosis not present

## 2017-07-24 NOTE — Patient Instructions (Signed)
Reduce the sinemet 25/250 tablets 1.5 tablets three times a day.

## 2017-07-24 NOTE — Progress Notes (Addendum)
Reason for visit: Parkinson's disease  Andre [name] is an 82 y.o. male  History of present illness:  Andre [name] is a 82 year old right-handed white male with a history of obesity, sleep apnea on CPAP, and Parkinson's disease.  Andre [name] is on fairly high-dose Sinemet taken to 25/250 tablets, 2 tablets 3 times daily, he is also on Comtan taking one 200 mg tablet 3 times daily.  Andre [name] is having worsening problems with fatigue, he is drowsy, he no longer drives a car because he keeps falling asleep.  Andre [name] has had increasing problems with shortness of breath and dyspnea on exertion.  He will be having a 2D echocardiogram in Andre near future.  Andre [name] has dizziness when he stands up.  He has not had any blackouts but he did have a fall while at [facility] in January 2019.  Andre [name] does not exercise on a regular basis.  He returns to this office for an evaluation.  Past Medical History:  Diagnosis Date  . Cervical spondylosis 10/01/2013  . Chronic diastolic CHF (congestive heart failure) ([place])    a. 07/2016 Echo: >55%; b. 10/2016 Echo: EF 55-60%, Gr1 DD, Ao sclerosis w/o stenosis, sev dil LA.  [name] Coronary artery disease    a. 1998 s/p mini-cabg @ [facility] - LIMA->LAD;  b. 07/2016 St Echo:  Inadequate HR (max 97) w/ hypertensive response (220/96). Ex time only 2:54 - stopped due to dyspnea and leg pain;  c.  08/2016 MV: EF 67%, no ischemia, low risk; d. 10/2016 NSTEMI/Cath: LM 40, LAD 100ost, RI 80, LCX nl, RCA 95p (4.0x26 Onyx DES), 33m, LIMA->LAD nl, EF 50-55%.  . Depression   . GERD (gastroesophageal reflux disease)   . Hyperlipidemia   . Hypertension   . PAF (paroxysmal atrial fibrillation) ([facility])    a. s/p DCCV-->maintaining sinus on amiodarone;  b. CHA2DS2VASc = 5-->eliquis.  . Parkinson's disease ([place])    tremors  . Pulmonary embolism ([place]) 2011  . 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 Sleep apnea    wears CPAP    Past Surgical History:  Procedure Laterality Date  .  BACK SURGERY  1960  . CARDIAC CATHETERIZATION    . CHOLECYSTECTOMY  2010  . CORONARY ARTERY BYPASS GRAFT  01/07/1997  . CORONARY STENT INTERVENTION N/A 10/31/2016   Procedure: Coronary Stent Intervention;  Surgeon: Wellington Hampshire, MD;  Location: [place] [facility];  Service: Cardiovascular;  Laterality: N/A;  . ELECTROPHYSIOLOGIC STUDY N/A 07/14/2015   Procedure: CARDIOVERSION;  Surgeon: Yolonda Kida, MD;  Location: [facility];  Service: Cardiovascular;  Laterality: N/A;  . ELECTROPHYSIOLOGIC STUDY N/A 10/12/2015   Procedure: CARDIOVERSION;  Surgeon: Minna Merritts, MD;  Location: [facility];  Service: Cardiovascular;  Laterality: N/A;  . LEFT HEART CATH AND CORONARY ANGIOGRAPHY N/A 10/31/2016   Procedure: Left Heart Cath and Coronary Angiography;  Surgeon: Wellington Hampshire, MD;  Location: [facility];  Service: Cardiovascular;  Laterality: N/A;  . OTHER SURGICAL HISTORY  1998   Bypass    Family History  Family history unknown: Yes    Social history:  reports that he quit smoking about 45 years ago. His smoking use included cigarettes, pipe, and cigars. He has a 40.00 pack-year smoking history. He quit smokeless tobacco use about 45 years ago. His smokeless tobacco use included chew. He reports that he drinks about 2.4 oz of alcohol per week. He reports that he does not use drugs.    Allergies  Allergen Reactions  . Pravastatin Other (See Comments)  . Prednisone Other (See Comments)    Pt states that med makes him hyper Pt states that med makes him hyper    Medications:  Prior to Admission medications   Medication Sig Start Date End Date Taking? Authorizing Provider  acetaminophen (TYLENOL) 500 MG tablet Take 500 mg every 6 (six) hours as needed by mouth for mild pain or moderate pain.    Yes [provider]  carbidopa-levodopa (SINEMET) 25-250 MG tablet Take 2 tablets by mouth 3 (three) times daily. 02/01/17  Yes Kathrynn Ducking, MD  carvedilol (COREG)  6.25 MG tablet TAKE ONE TABLET BY MOUTH TWICE DAILY WITH A MEAL 07/24/17  Yes Wellington Hampshire, MD  clopidogrel (PLAVIX) 75 MG tablet TAKE ONE TABLET BY MOUTH EVERY DAY WITH BREAKFAST 07/06/17  Yes Wellington Hampshire, MD  cyclobenzaprine (FLEXERIL) 5 MG tablet Take 5 mg by mouth 3 (three) times daily as needed for muscle spasms.   Yes [provider]  ELIQUIS 5 MG TABS tablet TAKE ONE TABLET TWICE DAILY 02/06/17  Yes Wellington Hampshire, MD  entacapone (COMTAN) 200 MG tablet Take 1 tablet (200 mg total) by mouth 3 (three) times daily. 03/29/17  Yes Kathrynn Ducking, MD  EPINEPHrine 0.3 mg/0.3 mL IJ SOAJ injection Inject 0.3 mg as directed once as needed (allergic reaction). Reported on 10/27/2015 07/01/13  Yes [provider]  esomeprazole (NEXIUM) 20 MG capsule Take 40 mg by mouth daily at 12 noon.   Yes [provider]  fluticasone (FLONASE) 50 MCG/ACT nasal spray TAKE 2 PUFFS IN EACH NOSTRIL EVERY DAY 10/18/16  Yes Cook, Jayce G, DO  HYDROcodone-acetaminophen (NORCO/VICODIN) 5-325 MG tablet Take 1 tablet by mouth as needed.  06/16/17  Yes [provider]  isosorbide mononitrate (IMDUR) 60 MG 24 hr tablet Take 1 tablet (60 mg total) by mouth daily. 02/14/17  Yes Wellington Hampshire, MD  levothyroxine (SYNTHROID, LEVOTHROID) 50 MCG tablet 50 mcg daily before breakfast.  09/07/16  Yes [provider]  losartan (COZAAR) 100 MG tablet Take 1 tablet (100 mg total) by mouth daily. 02/14/17  Yes Wellington Hampshire, MD  mirabegron ER (MYRBETRIQ) 50 MG TB24 tablet Take 50 mg by mouth daily.   Yes [provider]  sertraline (ZOLOFT) 50 MG tablet 1 tablet daily for 2 weeks, then take 2 tablets daily [name] taking differently: Take 50 mg by mouth daily. 1 tablet daily for 2 weeks, then take 2 tablets daily 06/13/17  Yes Kathrynn Ducking, MD  VENTOLIN HFA 108 [phone] Base) MCG/ACT inhaler TAKE 2 PUFFS EVERY 8 HOURS AS NEEDED FORWHEEZING 12/02/16  Yes Cook, Jayce G, DO    furosemide (LASIX) 20 MG tablet Take 1 tablet (20 mg total) by mouth daily. 04/06/17 07/05/17  Theora Gianotti, NP  rosuvastatin (CRESTOR) 10 MG tablet Take 1 tablet (10 mg total) by mouth daily. 02/14/17 06/19/17  Wellington Hampshire, MD    ROS:  Out of a complete 14 system review of symptoms, Andre [name] complains only of Andre following symptoms, and all other reviewed systems are negative.  Fatigue Hearing loss, ringing in Andre ears, runny nose, drooling Eye discharge, eye itching, eye redness, blurred vision Shortness of breath Incontinence of bladder, frequency of urination Sleep apnea, daytime sleepiness Dizziness, numbness, tremors Agitation, decreased concentration, depression, anxiety  Blood pressure (!) 189/88, pulse 64, height 5\' 6"  (1.676 m), weight 271 lb 8 oz (123.2 kg).   Systolic blood  pressure, right arm, sitting is 130.  Systolic blood pressure, right arm, standing is 140.  Physical Exam  General: Andre [name] is alert and cooperative at Andre time of Andre examination.  Andre [name] is markedly obese.  Skin: No significant peripheral edema is noted.   Neurologic Exam  Mental status: Andre [name] is alert and oriented x 3 at Andre time of Andre examination. Andre [name] has apparent normal recent and remote memory, with an apparently normal attention span and concentration ability.   Cranial nerves: Facial symmetry is present. Speech is normal, no aphasia or dysarthria is noted. Extraocular movements are full. Visual fields are full.  Motor: Andre [name] has good strength in all 4 extremities.  Sensory examination: Soft touch sensation is symmetric on Andre face, arms, and legs.  Coordination: Andre [name] has good finger-nose-finger and heel-to-shin bilaterally.  Gait and station: Andre [name] has Andre ability to stand from a seated position with arms crossed.  Once up, Andre [name] has a slightly wide-based gait, he has good stride, good turns, good arm swing on Andre  right, slightly decreased arm swing on Andre left.  No tremor is seen.  Romberg is negative. No drift is seen.  Reflexes: Deep tendon reflexes are symmetric.   Assessment/Plan:  1.  History of Parkinson's disease  2.  Obesity  3.  Chronic fatigue  Andre [name] clearly is overweight, he has gained 10 pounds since last seen 6 months ago.  Andre [name] has dyspnea on exertion.  He desperately needs to diet, and start a graded exercise program.  Andre [name] will be reduced on Andre Sinemet taking 1.5 tablets 3 times daily with Comtan.  Andre [name] really has very little features of Parkinson's disease.  Andre [name] will follow-up in 6 months.  Jill Alexanders MD 07/24/2017 12:14 PM  [facility] [address] [place], [place] [address]  Phone [phone] Fax [phone]

## 2017-07-26 ENCOUNTER — Telehealth: Payer: Self-pay | Admitting: *Deleted

## 2017-07-26 NOTE — Telephone Encounter (Signed)
Copied from [place] [id number]. Topic: Inquiry >> Jul 26, 2017  9:13 AM Moton, Claiborne [place], [place] wrote: Reason for [id number] wife is calling because he husband was referred to get a CT-Scan and they found a Cyst on his Kidneys. Stated that he has an appointment with a Dr.Cope [phone] but it isn't until May 30th and she is worried that the appointment is to far away. She would like to know if Dr.Sonnenberg could possible could get the appointment moved up to an early date. She would like to speak with him or his nurse about this. She can be reached at [phone]

## 2017-07-26 NOTE — Telephone Encounter (Signed)
Please advise 

## 2017-07-27 NOTE — Telephone Encounter (Signed)
Left message for patient to return call to [facility] nurse may advise patient of PCP advice.

## 2017-07-27 NOTE — Telephone Encounter (Signed)
Please let the patient and his wife know that the cyst is likely benign meaning that it is noncancerous and that it appears to have been present since at least 2016.  We can attempt to get him set up with Dr. Jacqlyn Larsen sooner though if they are unable to get him in at an earlier date I think it would be fine to wait until May.

## 2017-07-28 NOTE — Telephone Encounter (Signed)
Pt. And wife given Dr. Ellen Henri message. States he would like a sooner appointment with Dr. Jacqlyn Larsen if possible.

## 2017-07-29 NOTE — Telephone Encounter (Signed)
I will forward to Rasheedah to see if she can get the patient a sooner appointment with Dr Jacqlyn Larsen for a large Renal cyst. If they do not hear back from [place] in the next week regarding this they should contact us. Thanks.

## 2017-08-01 NOTE — Telephone Encounter (Signed)
Patient aware.

## 2017-08-02 ENCOUNTER — Ambulatory Visit: Payer: PPO | Admitting: Neurology

## 2017-08-04 ENCOUNTER — Other Ambulatory Visit: Payer: PPO

## 2017-08-09 ENCOUNTER — Telehealth: Payer: Self-pay

## 2017-08-09 NOTE — Telephone Encounter (Signed)
Please advise 

## 2017-08-09 NOTE — Telephone Encounter (Signed)
Copied from [place] [id number]. Topic: Inquiry >> Aug 09, 2017  3:55 PM Pricilla Handler wrote: Reason for CRM: Patient wants to know if he can resume receiving an injection for his allergies. Please call the patient at [phone]. [name]

## 2017-08-09 NOTE — Telephone Encounter (Signed)
We do not do allergy injections here.  We would need to refer to an allergist.

## 2017-08-10 ENCOUNTER — Encounter: Payer: Self-pay | Admitting: Emergency Medicine

## 2017-08-10 ENCOUNTER — Telehealth: Payer: Self-pay

## 2017-08-10 ENCOUNTER — Other Ambulatory Visit: Payer: Self-pay

## 2017-08-10 ENCOUNTER — Emergency Department: Payer: PPO

## 2017-08-10 ENCOUNTER — Inpatient Hospital Stay
Admission: EM | Admit: 2017-08-10 | Discharge: 2017-08-12 | DRG: 193 | Disposition: A | Discharge status: Home / Self Care | Payer: PPO | Attending: Family Medicine | Admitting: Family Medicine

## 2017-08-10 DIAGNOSIS — K219 Gastro-esophageal reflux disease without esophagitis: Secondary | ICD-10-CM | POA: Diagnosis present

## 2017-08-10 DIAGNOSIS — J441 Chronic obstructive pulmonary disease with (acute) exacerbation: Secondary | ICD-10-CM

## 2017-08-10 DIAGNOSIS — R0602 Shortness of breath: Secondary | ICD-10-CM | POA: Diagnosis not present

## 2017-08-10 DIAGNOSIS — Z79899 Other long term (current) drug therapy: Secondary | ICD-10-CM | POA: Diagnosis not present

## 2017-08-10 DIAGNOSIS — G20 Parkinson's disease: Secondary | ICD-10-CM | POA: Diagnosis present

## 2017-08-10 DIAGNOSIS — Z7901 Long term (current) use of anticoagulants: Secondary | ICD-10-CM | POA: Diagnosis not present

## 2017-08-10 DIAGNOSIS — I5032 Chronic diastolic (congestive) heart failure: Secondary | ICD-10-CM | POA: Diagnosis present

## 2017-08-10 DIAGNOSIS — J8410 Pulmonary fibrosis, unspecified: Secondary | ICD-10-CM | POA: Diagnosis present

## 2017-08-10 DIAGNOSIS — Z7902 Long term (current) use of antithrombotics/antiplatelets: Secondary | ICD-10-CM

## 2017-08-10 DIAGNOSIS — Z7951 Long term (current) use of inhaled steroids: Secondary | ICD-10-CM | POA: Diagnosis not present

## 2017-08-10 DIAGNOSIS — Z955 Presence of coronary angioplasty implant and graft: Secondary | ICD-10-CM | POA: Diagnosis not present

## 2017-08-10 DIAGNOSIS — I480 Paroxysmal atrial fibrillation: Secondary | ICD-10-CM | POA: Diagnosis present

## 2017-08-10 DIAGNOSIS — J9600 Acute respiratory failure, unspecified whether with hypoxia or hypercapnia: Secondary | ICD-10-CM | POA: Diagnosis present

## 2017-08-10 DIAGNOSIS — I110 Hypertensive heart disease with heart failure: Secondary | ICD-10-CM | POA: Diagnosis present

## 2017-08-10 DIAGNOSIS — Z87891 Personal history of nicotine dependence: Secondary | ICD-10-CM | POA: Diagnosis not present

## 2017-08-10 DIAGNOSIS — G4733 Obstructive sleep apnea (adult) (pediatric): Secondary | ICD-10-CM | POA: Diagnosis present

## 2017-08-10 DIAGNOSIS — I10 Essential (primary) hypertension: Secondary | ICD-10-CM | POA: Diagnosis not present

## 2017-08-10 DIAGNOSIS — D649 Anemia, unspecified: Secondary | ICD-10-CM

## 2017-08-10 DIAGNOSIS — J45909 Unspecified asthma, uncomplicated: Secondary | ICD-10-CM | POA: Diagnosis not present

## 2017-08-10 DIAGNOSIS — Z86711 Personal history of pulmonary embolism: Secondary | ICD-10-CM | POA: Diagnosis not present

## 2017-08-10 DIAGNOSIS — J440 Chronic obstructive pulmonary disease with acute lower respiratory infection: Secondary | ICD-10-CM | POA: Diagnosis present

## 2017-08-10 DIAGNOSIS — I2510 Atherosclerotic heart disease of native coronary artery without angina pectoris: Secondary | ICD-10-CM | POA: Diagnosis present

## 2017-08-10 DIAGNOSIS — E785 Hyperlipidemia, unspecified: Secondary | ICD-10-CM | POA: Diagnosis present

## 2017-08-10 DIAGNOSIS — J189 Pneumonia, unspecified organism: Secondary | ICD-10-CM | POA: Diagnosis present

## 2017-08-10 DIAGNOSIS — J9601 Acute respiratory failure with hypoxia: Secondary | ICD-10-CM

## 2017-08-10 DIAGNOSIS — Z951 Presence of aortocoronary bypass graft: Secondary | ICD-10-CM

## 2017-08-10 LAB — CBC WITH DIFFERENTIAL/PLATELET
Basophils Absolute: 0 10*3/uL (ref 0–0.1)
Basophils Relative: 0 %
Eosinophils Absolute: 0.1 10*3/uL (ref 0–0.7)
Eosinophils Relative: 1 %
HCT: 26.7 % — ABNORMAL LOW (ref 40.0–52.0)
Hemoglobin: 8.7 g/dL — ABNORMAL LOW (ref 13.0–18.0)
Lymphocytes Relative: 17 %
Lymphs Abs: 1.3 10*3/uL (ref 1.0–3.6)
MCH: 30.5 pg (ref 26.0–34.0)
MCHC: 32.5 g/dL (ref 32.0–36.0)
MCV: 94.1 fL (ref 80.0–100.0)
Monocytes Absolute: 0.8 10*3/uL (ref 0.2–1.0)
Monocytes Relative: 11 %
Neutro Abs: 5.4 10*3/uL (ref 1.4–6.5)
Neutrophils Relative %: 71 %
Platelets: 96 10*3/uL — ABNORMAL LOW (ref 150–440)
RBC: 2.83 MIL/uL — ABNORMAL LOW (ref 4.40–5.90)
RDW: 13.8 % (ref 11.5–14.5)
WBC: 7.7 10*3/uL (ref 3.8–10.6)

## 2017-08-10 LAB — BASIC METABOLIC PANEL
Anion gap: 9 (ref 5–15)
BUN: 17 mg/dL (ref 6–20)
CO2: 27 mmol/L (ref 22–32)
Calcium: 8.4 mg/dL — ABNORMAL LOW (ref 8.9–10.3)
Chloride: 102 mmol/L (ref 101–111)
Creatinine, Ser: 1.02 mg/dL (ref 0.61–1.24)
GFR calc Af Amer: >60 mL/min (ref >60)
GFR calc non Af Amer: >60 mL/min (ref >60)
Glucose, Bld: 139 mg/dL — ABNORMAL HIGH (ref 65–99)
Potassium: 3.5 mmol/L (ref 3.5–5.1)
Sodium: 138 mmol/L (ref 135–145)

## 2017-08-10 LAB — LACTIC ACID, PLASMA: Lactic Acid, Venous: 0.8 mmol/L (ref 0.5–1.9)

## 2017-08-10 LAB — TROPONIN I: Troponin I: <0.03 ng/mL (ref <0.03)

## 2017-08-10 LAB — BRAIN NATRIURETIC PEPTIDE: B Natriuretic Peptide: 161 pg/mL — ABNORMAL HIGH (ref 0.0–100.0)

## 2017-08-10 MED ORDER — SODIUM CHLORIDE 0.9 % IV SOLN
1.0000 g | Freq: Once | INTRAVENOUS | Status: AC
  Administered 2017-08-10: 1 g via INTRAVENOUS
  Filled 2017-08-10: qty 10

## 2017-08-10 MED ORDER — IPRATROPIUM-ALBUTEROL 0.5-2.5 (3) MG/3ML IN SOLN
3.0000 mL | Freq: Once | RESPIRATORY_TRACT | Status: AC
  Administered 2017-08-10: 3 mL via RESPIRATORY_TRACT
  Filled 2017-08-10: qty 3

## 2017-08-10 MED ORDER — DOXYCYCLINE HYCLATE 100 MG PO TABS
100.0000 mg | ORAL_TABLET | Freq: Once | ORAL | Status: AC
  Administered 2017-08-10: 100 mg via ORAL
  Filled 2017-08-10: qty 1

## 2017-08-10 MED ORDER — METHYLPREDNISOLONE SODIUM SUCC 125 MG IJ SOLR: 125.0000 mg | Freq: Once | INTRAMUSCULAR | Status: DC

## 2017-08-10 NOTE — Telephone Encounter (Signed)
Returned patients call to inform him that Dr. [name] did not have any opening this afternoon and nor did the providers. Patient states that he has been coughing up phlegm and tired but denies fever. I informed the patient that if he is not feeling well that he can also go to a walkin clinic and states that is what he will do.   Copied from [place] #[id number]. Topic: Appointment Scheduling - Scheduling Inquiry for Clinic >> Aug 10, 2017 12:26 PM Bea Graff, NT wrote: Reason for CRM: Pts wife wants to see if her husband can be worked in cough, congestion and phlegm.

## 2017-08-10 NOTE — ED Triage Notes (Signed)
Pt to ED via EMS from home c/o respiratory distress over the last week worsening last 3 days, unable to come in earlier d/t caring for wife at home.  EMS states patient had sats of 80% when fire department arrived was given 2 albuterol treatments and lungs became clear, EMS arrived and patient began wheezing again and was given duoneb treatment and 125mg  solumedrol.  Presents with labored breathing, speaking in broken sentences, denies pain, wears CPAP at night, hx MI in 2018 and HTN.

## 2017-08-10 NOTE — ED Provider Notes (Signed)
[facility] Emergency Department Provider Note  ____________________________________________  Time seen: Approximately 8:49 PM  I have reviewed the triage vital signs and the nursing notes.   HISTORY  Chief Complaint Respiratory Distress   HPI Andre [name] is a 82 y.o. male history of CAD status post CABG on Plavix, COPD, pulmonary fibrosis, CHF with preserved EF, paroxysmal A. fib, hypertension, hyperlipidemia, OSA on CPAP, and pulmonary embolism on Eliquis who presents for evaluation of shortness of breath.  Patient reports progressively worsening shortness of breath for a week.  He reports that his symptoms have become worse over the last 3 days.  He was unable to come in earlier because he was caring for his wife.  He called 911 today and was found to be hypoxic to 80% on room air.  He does not use oxygen at baseline.  Patient endorses a cough productive of green sputum.  No chest pain, no nausea, no vomiting, no fever or chills, no leg pain or swelling, no hemoptysis, no abdominal pain, no diarrhea.  Patient has been using his inhalers at home with temporary relief.  He denies leg pain or swelling, he denies weight gain.  Endorses compliance with his medications.   Past Medical History:  Diagnosis Date  . Cervical spondylosis 10/01/2013  . Chronic diastolic CHF (congestive heart failure) ([place])    a. 07/2016 Echo: >55%; b. 10/2016 Echo: EF 55-60%, Gr1 DD, Ao sclerosis w/o stenosis, sev dil LA.  [name] Coronary artery disease    a. 1998 s/p mini-cabg @ [facility] - LIMA->LAD;  b. 07/2016 St Echo:  Inadequate HR (max 97) w/ hypertensive response (220/96). Ex time only 2:54 - stopped due to dyspnea and leg pain;  c.  08/2016 MV: EF 67%, no ischemia, low risk; d. 10/2016 NSTEMI/Cath: LM 40, LAD 100ost, RI 80, LCX nl, RCA 95p (4.0x26 Onyx DES), 19m, LIMA->LAD nl, EF 50-55%.  . Depression   . GERD (gastroesophageal reflux disease)   . Hyperlipidemia   . Hypertension   . PAF  (paroxysmal atrial fibrillation) (HCC)    a. s/p DCCV-->maintaining sinus on amiodarone;  b. CHA2DS2VASc = 5-->eliquis.  . Parkinson's disease ([place])    tremors  . Pulmonary embolism ([place]) 2011  . 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 Sleep apnea    wears CPAP    Patient Active Problem List   Diagnosis Date Noted  . Renal lesion 07/08/2017  . Fall 05/29/2017  . Lightheadedness 05/29/2017  . Abrasion 02/08/2017  . Anxiety and depression 01/05/2017  . Prediabetes 10/27/2016  . Hypothyroidism 10/27/2016  . (HFpEF) heart failure with preserved ejection fraction ([place]) 09/27/2016  . Parkinson's disease ([place]) 06/30/2016  . PAF (paroxysmal atrial fibrillation) ([place])   . BMI 40.0-44.9, adult ([place]) 05/06/2015  . Erythrocytosis 01/28/2015  . Atherosclerosis of abdominal aorta ([place]) 12/08/2014  . Barrett's esophagus 12/31/2013  . DDD (degenerative disc disease), cervical 10/01/2013  . DDD (degenerative disc disease), lumbar 10/01/2013  . Coronary artery disease involving native coronary artery of native heart with angina pectoris ([place]) 10/30/2012  . GERD (gastroesophageal reflux disease) 10/30/2012  . Hyperlipidemia with target LDL less than 70 10/30/2012  . Essential hypertension 10/30/2012  . Dyspnea 09/10/2012  . Allergic rhinitis 09/10/2012  . OSA (obstructive sleep apnea) 09/10/2012    Past Surgical History:  Procedure Laterality Date  . BACK SURGERY  1960  . CARDIAC CATHETERIZATION    . CHOLECYSTECTOMY  2010  . CORONARY ARTERY BYPASS GRAFT  01/07/1997  . CORONARY STENT INTERVENTION  N/A 10/31/2016   Procedure: Coronary Stent Intervention;  Surgeon: Wellington Hampshire, MD;  Location: [place] [facility];  Service: Cardiovascular;  Laterality: N/A;  . ELECTROPHYSIOLOGIC STUDY N/A 07/14/2015   Procedure: CARDIOVERSION;  Surgeon: Yolonda Kida, MD;  Location: [facility] ORS;  Service: Cardiovascular;  Laterality: N/A;  . ELECTROPHYSIOLOGIC STUDY N/A 10/12/2015   Procedure:  CARDIOVERSION;  Surgeon: Minna Merritts, MD;  Location: [facility] ORS;  Service: Cardiovascular;  Laterality: N/A;  . LEFT HEART CATH AND CORONARY ANGIOGRAPHY N/A 10/31/2016   Procedure: Left Heart Cath and Coronary Angiography;  Surgeon: Wellington Hampshire, MD;  Location: [place] [facility];  Service: Cardiovascular;  Laterality: N/A;  . OTHER SURGICAL HISTORY  1998   Bypass    Prior to Admission medications   Medication Sig Start Date End Date Taking? Authorizing Provider  acetaminophen (TYLENOL) 500 MG tablet Take 500 mg every 6 (six) hours as needed by mouth for mild pain or moderate pain.    Yes [provider]  carbidopa-levodopa (SINEMET) 25-250 MG tablet Take 2 tablets by mouth 3 (three) times daily. Patient taking differently: Take 1.5 tablets by mouth 3 (three) times daily.  02/01/17  Yes Kathrynn Ducking, MD  carvedilol (COREG) 6.25 MG tablet TAKE ONE TABLET BY MOUTH TWICE DAILY WITH A MEAL 07/24/17  Yes Wellington Hampshire, MD  clopidogrel (PLAVIX) 75 MG tablet TAKE ONE TABLET BY MOUTH EVERY DAY WITH BREAKFAST 07/06/17  Yes Wellington Hampshire, MD  cyclobenzaprine (FLEXERIL) 5 MG tablet Take 5 mg by mouth 3 (three) times daily as needed for muscle spasms.   Yes [provider]  ELIQUIS 5 MG TABS tablet TAKE ONE TABLET TWICE DAILY 02/06/17  Yes Wellington Hampshire, MD  entacapone (COMTAN) 200 MG tablet Take 1 tablet (200 mg total) by mouth 3 (three) times daily. 03/29/17  Yes Kathrynn Ducking, MD  EPINEPHrine 0.3 mg/0.3 mL IJ SOAJ injection Inject 0.3 mg as directed once as needed (allergic reaction). Reported on 10/27/2015 07/01/13  Yes [provider]  esomeprazole (NEXIUM) 20 MG capsule Take 40 mg by mouth daily at 12 noon.   Yes [provider]  fluticasone (FLONASE) 50 MCG/ACT nasal spray TAKE 2 PUFFS IN EACH NOSTRIL EVERY DAY 10/18/16  Yes Cook, Jayce G, DO  furosemide (LASIX) 20 MG tablet Take 20 mg by mouth daily.   Yes [provider]    HYDROcodone-acetaminophen (NORCO/VICODIN) 5-325 MG tablet Take 1 tablet by mouth as needed.  06/16/17  Yes [provider]  isosorbide mononitrate (IMDUR) 60 MG 24 hr tablet Take 1 tablet (60 mg total) by mouth daily. 02/14/17  Yes Wellington Hampshire, MD  levothyroxine (SYNTHROID, LEVOTHROID) 50 MCG tablet Take 50 mcg by mouth daily before lunch.  09/07/16  Yes [provider]  losartan (COZAAR) 100 MG tablet Take 1 tablet (100 mg total) by mouth daily. 02/14/17  Yes Wellington Hampshire, MD  mirabegron ER (MYRBETRIQ) 50 MG TB24 tablet Take 50 mg by mouth daily.   Yes [provider]  rosuvastatin (CRESTOR) 10 MG tablet Take 10 mg by mouth at bedtime.   Yes [provider]  sertraline (ZOLOFT) 50 MG tablet 1 tablet daily for 2 weeks, then take 2 tablets daily Patient taking differently: Take 50 mg by mouth daily.  06/13/17  Yes Kathrynn Ducking, MD  VENTOLIN HFA 108 [phone] Base) MCG/ACT inhaler TAKE 2 PUFFS EVERY 8 HOURS AS NEEDED FORWHEEZING 12/02/16  Yes Coral Spikes, DO  furosemide (LASIX) 20 MG tablet Take 1 tablet (20 mg total) by mouth daily. 04/06/17 07/05/17  Theora Gianotti, NP  rosuvastatin (CRESTOR) 10 MG tablet Take 1 tablet (10 mg total) by mouth daily. 02/14/17 06/19/17  Wellington Hampshire, MD    Allergies Pravastatin and Prednisone  Family History  Family history unknown: Yes    Social History Social History   Tobacco Use  . Smoking status: Former Smoker    Packs/day: 1.00    Years: 40.00    Pack years: 40.00    Types: Cigarettes, Pipe, Cigars    Last attempt to quit: 04/18/1972    Years since quitting: 45.3  . Smokeless tobacco: Former Systems developer    Types: Chew    Quit date: 04/18/1972  Substance Use Topics  . Alcohol use: Yes    Alcohol/week: 2.4 oz    Types: 2 Cans of beer, 2 Shots of liquor per week    Comment: per 2 weeks   . Drug use: No    Review of Systems  Constitutional: Negative for fever. Eyes: Negative for visual  changes. ENT: Negative for sore throat. Neck: No neck pain  Cardiovascular: Negative for chest pain. Respiratory: + shortness of breath, cough Gastrointestinal: Negative for abdominal pain, vomiting or diarrhea. Genitourinary: Negative for dysuria. Musculoskeletal: Negative for back pain. Skin: Negative for rash. Neurological: Negative for headaches, weakness or numbness. Psych: No SI or HI  ____________________________________________   PHYSICAL EXAM:  VITAL SIGNS: ED Triage Vitals  Enc Vitals Group     BP --      Pulse Rate 08/10/17 2041 61     Resp 08/10/17 2041 (!) 22     Temp 08/10/17 2041 (!) 97.5 F (36.4 C)     Temp Source 08/10/17 2041 Oral     SpO2 08/10/17 2041 95 %     Weight 08/10/17 2043 270 lb (122.5 kg)     Height 08/10/17 2043 5\' 6"  (1.676 m)     Head Circumference --      Peak Flow --      Pain Score 08/10/17 2043 0     Pain Loc --      Pain Edu? --      Excl. in [place]? --     Constitutional: Alert and oriented. Respiratory distress. HEENT:      Head: Normocephalic and atraumatic.         Eyes: Conjunctivae are normal. Sclera is non-icteric.       Mouth/Throat: Mucous membranes are moist.       Neck: Supple with no signs of meningismus. Cardiovascular: Regular rate and rhythm. No murmurs, gallops, or rubs. 2+ symmetrical distal pulses are present in all extremities. Elevated JVD to ear lobe Respiratory: Moderate increased work of breathing, hypoxic on room air, satting 95% on 2 L, tachypnea, diffuse expiratory wheezes throughout Gastrointestinal: Soft, non tender, and non distended with positive bowel sounds. No rebound or guarding. Musculoskeletal: 1+ pitting edema bilaterally Neurologic: Normal speech and language. Face is symmetric. Moving all extremities. No gross focal neurologic deficits are appreciated. Skin: Skin is warm, dry and intact. No rash noted. Psychiatric: Mood and affect are normal. Speech and behavior are  normal.  ____________________________________________   LABS (all labs ordered are listed, but only abnormal results are displayed)  Labs Reviewed  CBC WITH DIFFERENTIAL/PLATELET - Abnormal; Notable for the following components:      Result Value   RBC 2.83 (*)    Hemoglobin 8.7 (*)    HCT  26.7 (*)    Platelets 96 (*)    All other components within normal limits  BASIC METABOLIC PANEL - Abnormal; Notable for the following components:   Glucose, Bld 139 (*)    Calcium 8.4 (*)    All other components within normal limits  BRAIN NATRIURETIC PEPTIDE - Abnormal; Notable for the following components:   B Natriuretic Peptide 161.0 (*)    All other components within normal limits  CULTURE, BLOOD (ROUTINE X 2)  CULTURE, BLOOD (ROUTINE X 2)  TROPONIN I  LACTIC ACID, PLASMA   ____________________________________________  EKG  ED ECG REPORT I, Rudene Re, the attending physician, personally viewed and interpreted this ECG.  Normal sinus rhythm, rate of 61, normal intervals, left axis deviation, diffuse T wave flattening, no ST elevations or depressions. Unchanged from prior. ____________________________________________  RADIOLOGY  I have personally reviewed the images performed during this visit and I agree with the Radiologist's read.   Interpretation by Radiologist:  Dg Chest 2 View  Result Date: 08/10/2017 CLINICAL DATA:  Respiratory distress over the last week but worse over the last 3 days. Shortness of breath. EXAM: CHEST - 2 VIEW COMPARISON:  CT chest 05/30/2017.  Chest 10/28/2009 FINDINGS: Cardiac enlargement. No pulmonary vascular congestion. Coarse interstitial infiltrates with bronchial wall thickening demonstrating progression since previous study. This is likely to represent progressing fibrosis and bronchitic changes. Can't exclude a component of interstitial edema superimposed on chronic fibrosis. Small right pleural effusion with infiltration or atelectasis in  the right base. This could represent focal superimposed pneumonia. No pneumothorax. Calcified pleural plaques. Calcification of the aorta. Postoperative changes in the mediastinum. IMPRESSION: 1. Chronic appearing coarse interstitial infiltrates with bronchitic changes demonstrating progression since previous study. 2. Small right pleural effusion with infiltration or atelectasis in the right lung base possibly representing superimposed focal pneumonia. 3. Calcified pleural plaques. Combined with interstitial changes in the lungs, this may indicate evidence of asbestosis. 4. Aortic atherosclerosis. Electronically Signed   By: Lucienne Capers M.D.   On: 08/10/2017 21:17     ____________________________________________   PROCEDURES  Procedure(s) performed: None Procedures Critical Care performed: yes  CRITICAL CARE Performed by: Rudene Re  ?  Total critical care time: 40 min  Critical care time was exclusive of separately billable procedures and treating other patients.  Critical care was necessary to treat or prevent imminent or life-threatening deterioration.  Critical care was time spent personally by me on the following activities: development of treatment plan with patient and/or surrogate as well as nursing, discussions with consultants, evaluation of patient's response to treatment, examination of patient, obtaining history from patient or surrogate, ordering and performing treatments and interventions, ordering and review of laboratory studies, ordering and review of radiographic studies, pulse oximetry and re-evaluation of patient's condition.  ____________________________________________   INITIAL IMPRESSION / ASSESSMENT AND PLAN / ED COURSE   82 y.o. male history of CAD status post CABG on Plavix, COPD, pulmonary fibrosis, CHF with preserved EF, paroxysmal A. fib, hypertension, hyperlipidemia, OSA on CPAP, and pulmonary embolism on Eliquis who presents for evaluation  of shortness of breath x 1 week in the setting of a cough productive of green sputum.  Patient arrives in moderate respiratory distress, new oxygen requirement, diffuse wheezes throughout.  Will start BIPAP. Patient is afebrile with no tachycardia.  EKG shows no evidence of ischemia or arrhythmias.  Differential diagnoses including CHF exacerbation, pneumonia, COPD exacerbation, ACS.  Patient is on Eliquis therefore PE is less likely.  Will treat with  duo nebs.  Patient received Solu-Medrol per EMS.  Chest x-ray labs are pending.    _________________________ 11:35 PM on 08/10/2017 ----------------------------------------- CXR concerning for possible pneumonia superimposed on chronic lung disease. Labs showing normal lactic, normal WBC. Patient given ceftriaxone and doxy.  Patient with new hemoglobin of 8.7, baseline is 14 back in February 2019.  Rectal exam was done since patient is on 2 blood thinners and is negative.  Patient has not noticed any melena at home. Will send type and screen. Troponin is negative.  Patient remains on BiPAP and will be admitted to the hospitalist service.    As part of my medical decision making, I reviewed the following data within the [place] notes reviewed and incorporated, Labs reviewed , EKG interpreted , Old EKG reviewed, Old chart reviewed, Radiograph reviewed , Discussed with admitting physician , Notes from prior ED visits and [place] Controlled Substance Database    Pertinent labs & imaging results that were available during my care of the patient were reviewed by me and considered in my medical decision making (see chart for details).    ____________________________________________   FINAL CLINICAL IMPRESSION(S) / ED DIAGNOSES  Final diagnoses:  Acute respiratory failure with hypoxia ([place])  Community acquired pneumonia, unspecified laterality  Anemia, unspecified type  COPD exacerbation ([place])      NEW MEDICATIONS STARTED  DURING THIS VISIT:  ED Discharge Orders    None       Note:  This document was prepared using Dragon voice recognition software and may include unintentional dictation errors.    Alfred Levins, [place], MD 08/10/17 [phone]

## 2017-08-10 NOTE — ED Notes (Signed)
Patient transported to X-ray 

## 2017-08-10 NOTE — Telephone Encounter (Signed)
Left message to return call, ok for pec to inform patient of message below

## 2017-08-11 ENCOUNTER — Encounter: Payer: Self-pay | Admitting: Internal Medicine

## 2017-08-11 DIAGNOSIS — Z86711 Personal history of pulmonary embolism: Secondary | ICD-10-CM | POA: Diagnosis not present

## 2017-08-11 DIAGNOSIS — Z7901 Long term (current) use of anticoagulants: Secondary | ICD-10-CM | POA: Diagnosis not present

## 2017-08-11 DIAGNOSIS — I5032 Chronic diastolic (congestive) heart failure: Secondary | ICD-10-CM | POA: Diagnosis present

## 2017-08-11 DIAGNOSIS — D649 Anemia, unspecified: Secondary | ICD-10-CM | POA: Diagnosis present

## 2017-08-11 DIAGNOSIS — Z7902 Long term (current) use of antithrombotics/antiplatelets: Secondary | ICD-10-CM | POA: Diagnosis not present

## 2017-08-11 DIAGNOSIS — J189 Pneumonia, unspecified organism: Secondary | ICD-10-CM | POA: Diagnosis present

## 2017-08-11 DIAGNOSIS — J9600 Acute respiratory failure, unspecified whether with hypoxia or hypercapnia: Secondary | ICD-10-CM | POA: Diagnosis present

## 2017-08-11 DIAGNOSIS — I110 Hypertensive heart disease with heart failure: Secondary | ICD-10-CM | POA: Diagnosis present

## 2017-08-11 DIAGNOSIS — Z7951 Long term (current) use of inhaled steroids: Secondary | ICD-10-CM | POA: Diagnosis not present

## 2017-08-11 DIAGNOSIS — E785 Hyperlipidemia, unspecified: Secondary | ICD-10-CM | POA: Diagnosis present

## 2017-08-11 DIAGNOSIS — J8410 Pulmonary fibrosis, unspecified: Secondary | ICD-10-CM | POA: Diagnosis present

## 2017-08-11 DIAGNOSIS — J440 Chronic obstructive pulmonary disease with acute lower respiratory infection: Secondary | ICD-10-CM | POA: Diagnosis present

## 2017-08-11 DIAGNOSIS — K219 Gastro-esophageal reflux disease without esophagitis: Secondary | ICD-10-CM | POA: Diagnosis present

## 2017-08-11 DIAGNOSIS — Z79899 Other long term (current) drug therapy: Secondary | ICD-10-CM | POA: Diagnosis not present

## 2017-08-11 DIAGNOSIS — G4733 Obstructive sleep apnea (adult) (pediatric): Secondary | ICD-10-CM | POA: Diagnosis present

## 2017-08-11 DIAGNOSIS — J9601 Acute respiratory failure with hypoxia: Secondary | ICD-10-CM | POA: Diagnosis present

## 2017-08-11 DIAGNOSIS — Z951 Presence of aortocoronary bypass graft: Secondary | ICD-10-CM | POA: Diagnosis not present

## 2017-08-11 DIAGNOSIS — Z955 Presence of coronary angioplasty implant and graft: Secondary | ICD-10-CM | POA: Diagnosis not present

## 2017-08-11 DIAGNOSIS — I480 Paroxysmal atrial fibrillation: Secondary | ICD-10-CM | POA: Diagnosis present

## 2017-08-11 DIAGNOSIS — J441 Chronic obstructive pulmonary disease with (acute) exacerbation: Secondary | ICD-10-CM | POA: Diagnosis present

## 2017-08-11 DIAGNOSIS — Z87891 Personal history of nicotine dependence: Secondary | ICD-10-CM | POA: Diagnosis not present

## 2017-08-11 DIAGNOSIS — G20 Parkinson's disease: Secondary | ICD-10-CM | POA: Diagnosis present

## 2017-08-11 DIAGNOSIS — I2510 Atherosclerotic heart disease of native coronary artery without angina pectoris: Secondary | ICD-10-CM | POA: Diagnosis present

## 2017-08-11 LAB — BASIC METABOLIC PANEL
Anion gap: 9 (ref 5–15)
BUN: 17 mg/dL (ref 6–20)
CO2: 28 mmol/L (ref 22–32)
Calcium: 8.6 mg/dL — ABNORMAL LOW (ref 8.9–10.3)
Chloride: 104 mmol/L (ref 101–111)
Creatinine, Ser: 0.83 mg/dL (ref 0.61–1.24)
GFR calc Af Amer: >60 mL/min (ref >60)
GFR calc non Af Amer: >60 mL/min (ref >60)
Glucose, Bld: 178 mg/dL — ABNORMAL HIGH (ref 65–99)
Potassium: 3.9 mmol/L (ref 3.5–5.1)
Sodium: 141 mmol/L (ref 135–145)

## 2017-08-11 LAB — CBC
HCT: 41 % (ref 40.0–52.0)
Hemoglobin: 13.6 g/dL (ref 13.0–18.0)
MCH: 30.9 pg (ref 26.0–34.0)
MCHC: 33.2 g/dL (ref 32.0–36.0)
MCV: 93.3 fL (ref 80.0–100.0)
Platelets: 148 10*3/uL — ABNORMAL LOW (ref 150–440)
RBC: 4.4 MIL/uL (ref 4.40–5.90)
RDW: 13.5 % (ref 11.5–14.5)
WBC: 9.7 10*3/uL (ref 3.8–10.6)

## 2017-08-11 LAB — MAGNESIUM: Magnesium: 2 mg/dL (ref 1.7–2.4)

## 2017-08-11 LAB — GLUCOSE, CAPILLARY: Glucose-Capillary: 182 mg/dL — ABNORMAL HIGH (ref 65–99)

## 2017-08-11 MED ORDER — ACETAMINOPHEN 325 MG PO TABS: 650.0000 mg | ORAL_TABLET | Freq: Four times a day (QID) | ORAL | Status: DC | PRN

## 2017-08-11 MED ORDER — CARBIDOPA-LEVODOPA 25-250 MG PO TABS
1.5000 | ORAL_TABLET | Freq: Three times a day (TID) | ORAL | Status: DC
  Administered 2017-08-11 – 2017-08-12 (×6): 1.5 via ORAL
  Filled 2017-08-11 (×6): qty 2

## 2017-08-11 MED ORDER — ENTACAPONE 200 MG PO TABS
200.0000 mg | ORAL_TABLET | Freq: Three times a day (TID) | ORAL | Status: DC
  Administered 2017-08-11 – 2017-08-12 (×6): 200 mg via ORAL
  Filled 2017-08-11 (×7): qty 1

## 2017-08-11 MED ORDER — ONDANSETRON HCL 4 MG PO TABS: 4.0000 mg | ORAL_TABLET | Freq: Four times a day (QID) | ORAL | Status: DC | PRN

## 2017-08-11 MED ORDER — DOXYCYCLINE HYCLATE 100 MG PO TABS
100.0000 mg | ORAL_TABLET | Freq: Two times a day (BID) | ORAL | Status: DC
  Administered 2017-08-11 – 2017-08-12 (×4): 100 mg via ORAL
  Filled 2017-08-11 (×4): qty 1

## 2017-08-11 MED ORDER — CARVEDILOL 6.25 MG PO TABS
6.2500 mg | ORAL_TABLET | Freq: Two times a day (BID) | ORAL | Status: DC
  Administered 2017-08-11 – 2017-08-12 (×4): 6.25 mg via ORAL
  Filled 2017-08-11 (×4): qty 1

## 2017-08-11 MED ORDER — HEPARIN SODIUM (PORCINE) 5000 UNIT/ML IJ SOLN
5000.0000 [IU] | Freq: Three times a day (TID) | INTRAMUSCULAR | Status: DC
  Administered 2017-08-11 – 2017-08-12 (×5): 5000 [IU] via SUBCUTANEOUS
  Filled 2017-08-11 (×5): qty 1

## 2017-08-11 MED ORDER — HYDROCODONE-ACETAMINOPHEN 5-325 MG PO TABS: 1.0000 | ORAL_TABLET | ORAL | Status: DC | PRN

## 2017-08-11 MED ORDER — ISOSORBIDE MONONITRATE ER 60 MG PO TB24
60.0000 mg | ORAL_TABLET | Freq: Every day | ORAL | Status: DC
  Administered 2017-08-11 – 2017-08-12 (×2): 60 mg via ORAL
  Filled 2017-08-11 (×2): qty 1

## 2017-08-11 MED ORDER — ENSURE ENLIVE PO LIQD
237.0000 mL | Freq: Two times a day (BID) | ORAL | Status: DC
  Administered 2017-08-11 – 2017-08-12 (×3): 237 mL via ORAL

## 2017-08-11 MED ORDER — METHYLPREDNISOLONE SODIUM SUCC 125 MG IJ SOLR
60.0000 mg | Freq: Two times a day (BID) | INTRAMUSCULAR | Status: DC
  Administered 2017-08-11 – 2017-08-12 (×2): 60 mg via INTRAVENOUS
  Filled 2017-08-11 (×2): qty 2

## 2017-08-11 MED ORDER — PANTOPRAZOLE SODIUM 40 MG PO TBEC
40.0000 mg | DELAYED_RELEASE_TABLET | Freq: Every day | ORAL | Status: DC
  Administered 2017-08-11 – 2017-08-12 (×2): 40 mg via ORAL
  Filled 2017-08-11 (×2): qty 1

## 2017-08-11 MED ORDER — ROSUVASTATIN CALCIUM 10 MG PO TABS
10.0000 mg | ORAL_TABLET | Freq: Every day | ORAL | Status: DC
  Administered 2017-08-11 – 2017-08-12 (×2): 10 mg via ORAL
  Filled 2017-08-11 (×2): qty 1

## 2017-08-11 MED ORDER — LOSARTAN POTASSIUM 50 MG PO TABS
100.0000 mg | ORAL_TABLET | Freq: Every day | ORAL | Status: DC
  Administered 2017-08-11 – 2017-08-12 (×2): 100 mg via ORAL
  Filled 2017-08-11 (×2): qty 2

## 2017-08-11 MED ORDER — ACETAMINOPHEN 650 MG RE SUPP: 650.0000 mg | Freq: Four times a day (QID) | RECTAL | Status: DC | PRN

## 2017-08-11 MED ORDER — CLOPIDOGREL BISULFATE 75 MG PO TABS
75.0000 mg | ORAL_TABLET | Freq: Every day | ORAL | Status: DC
  Administered 2017-08-11 – 2017-08-12 (×2): 75 mg via ORAL
  Filled 2017-08-11 (×2): qty 1

## 2017-08-11 MED ORDER — BISACODYL 5 MG PO TBEC: 5.0000 mg | DELAYED_RELEASE_TABLET | Freq: Every day | ORAL | Status: DC | PRN

## 2017-08-11 MED ORDER — DOCUSATE SODIUM 100 MG PO CAPS
100.0000 mg | ORAL_CAPSULE | Freq: Two times a day (BID) | ORAL | Status: DC
  Administered 2017-08-11 – 2017-08-12 (×4): 100 mg via ORAL
  Filled 2017-08-11 (×4): qty 1

## 2017-08-11 MED ORDER — SERTRALINE HCL 50 MG PO TABS
50.0000 mg | ORAL_TABLET | Freq: Every day | ORAL | Status: DC
  Administered 2017-08-11 – 2017-08-12 (×2): 50 mg via ORAL
  Filled 2017-08-11: qty 1

## 2017-08-11 MED ORDER — FLUTICASONE PROPIONATE 50 MCG/ACT NA SUSP
1.0000 | Freq: Every day | NASAL | Status: DC
  Administered 2017-08-12: 1 via NASAL
  Filled 2017-08-11 (×3): qty 16

## 2017-08-11 MED ORDER — ADULT MULTIVITAMIN W/MINERALS CH
1.0000 | ORAL_TABLET | Freq: Every day | ORAL | Status: DC
  Administered 2017-08-11 – 2017-08-12 (×2): 1 via ORAL
  Filled 2017-08-11: qty 1

## 2017-08-11 MED ORDER — TRAZODONE HCL 50 MG PO TABS
25.0000 mg | ORAL_TABLET | Freq: Every evening | ORAL | Status: DC | PRN
  Administered 2017-08-11: 25 mg via ORAL
  Filled 2017-08-11: qty 1

## 2017-08-11 MED ORDER — FUROSEMIDE 20 MG PO TABS
20.0000 mg | ORAL_TABLET | Freq: Every day | ORAL | Status: DC
  Administered 2017-08-11 – 2017-08-12 (×2): 20 mg via ORAL
  Filled 2017-08-11 (×2): qty 1

## 2017-08-11 MED ORDER — ONDANSETRON HCL 4 MG/2ML IJ SOLN: 4.0000 mg | Freq: Four times a day (QID) | INTRAMUSCULAR | Status: DC | PRN

## 2017-08-11 MED ORDER — METHYLPREDNISOLONE SODIUM SUCC 125 MG IJ SOLR
80.0000 mg | Freq: Two times a day (BID) | INTRAMUSCULAR | Status: DC
  Administered 2017-08-11: 80 mg via INTRAVENOUS
  Filled 2017-08-11: qty 2

## 2017-08-11 MED ORDER — SODIUM CHLORIDE 0.9 % IV SOLN
1.0000 g | INTRAVENOUS | Status: DC
  Administered 2017-08-11: 1 g via INTRAVENOUS
  Filled 2017-08-11 (×2): qty 10

## 2017-08-11 MED ORDER — IPRATROPIUM-ALBUTEROL 0.5-2.5 (3) MG/3ML IN SOLN
3.0000 mL | Freq: Four times a day (QID) | RESPIRATORY_TRACT | Status: DC
  Administered 2017-08-11 – 2017-08-12 (×7): 3 mL via RESPIRATORY_TRACT
  Filled 2017-08-11 (×7): qty 3

## 2017-08-11 MED ORDER — LEVOTHYROXINE SODIUM 50 MCG PO TABS
50.0000 ug | ORAL_TABLET | Freq: Every day | ORAL | Status: DC
  Administered 2017-08-11 – 2017-08-12 (×2): 50 ug via ORAL
  Filled 2017-08-11 (×2): qty 1

## 2017-08-11 NOTE — Progress Notes (Signed)
The patient feels better, but is still shortness of breath and wheezing, on oxygen 3 L. Vital signs reviewed.  Physical examination is unremarkable except bilateral expiratory wheezing. Acute respiratory failure with hypoxia due to CAP and the COPD exacerbation.  Continue current treatment.  Try to wean off oxygen.  Time spent about 26 minutes.

## 2017-08-11 NOTE — H&P (Signed)
[facility] NAME: Andre [name]    MR#:  [id number]  DATE OF BIRTH:  [birth year]  DATE OF ADMISSION:  08/10/2017  PRIMARY CARE PHYSICIAN: [name], MD   REQUESTING/REFERRING PHYSICIAN:   CHIEF COMPLAINT:   Chief Complaint  Patient presents with  . Respiratory Distress    HISTORY OF PRESENT ILLNESS: Andre [name]  is a 82 y.o. male with a known history of Parkinson's disease, diastolic CHF, hypertension, paroxysmal atrial fibrillation on Eliquis, coronary artery disease status post stent placement last year in July, obstructive sleep apnea, COPD, pulmonary fibrosis. Patient presented to emergency room for acute onset of shortness of breath and productive cough, going on for the past week, gradually getting worse.  Patient has tried nebulizer treatment at home without any improvement.  He denies any chest pain, nausea, vomiting, diarrhea.  No fever or chills at home Patient does not use oxygen at baseline, but his oxygen saturation was 80% on room air at the arrival to emergency room.  He improved with BiPAP treatment, nebulizers and steroids IV received in the emergency room. Blood test done emergency room showed low hemoglobin level at 8.7, but otherwise mostly unremarkable.  Patient denies any active bleeding. Chest x-ray, reviewed by myself, shows right lung base infiltrate. Patient is admitted for further evaluation and treatment.  PAST MEDICAL HISTORY:   Past Medical History:  Diagnosis Date  . Cervical spondylosis 10/01/2013  . Chronic diastolic CHF (congestive heart failure) ([place])    a. 07/2016 Echo: >55%; b. 10/2016 Echo: EF 55-60%, Gr1 DD, Ao sclerosis w/o stenosis, sev dil LA.  [name] Coronary artery disease    a. 1998 s/p mini-cabg @ [facility] - LIMA->LAD;  b. 07/2016 St Echo:  Inadequate HR (max 97) w/ hypertensive response (220/96). Ex time only 2:54 - stopped due to dyspnea and leg pain;  c.  08/2016 MV: EF 67%, no  ischemia, low risk; d. 10/2016 NSTEMI/Cath: LM 40, LAD 100ost, RI 80, LCX nl, RCA 95p (4.0x26 Onyx DES), 16m, LIMA->LAD nl, EF 50-55%.  . Depression   . GERD (gastroesophageal reflux disease)   . Hyperlipidemia   . Hypertension   . PAF (paroxysmal atrial fibrillation) ([facility])    a. s/p DCCV-->maintaining sinus on amiodarone;  b. CHA2DS2VASc = 5-->eliquis.  . Parkinson's disease ([place])    tremors  . Pulmonary embolism ([place]) 2011  . 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 Sleep apnea    wears CPAP    PAST SURGICAL HISTORY:  Past Surgical History:  Procedure Laterality Date  . BACK SURGERY  1960  . CARDIAC CATHETERIZATION    . CHOLECYSTECTOMY  2010  . CORONARY ARTERY BYPASS GRAFT  01/07/1997  . CORONARY STENT INTERVENTION N/A 10/31/2016   Procedure: Coronary Stent Intervention;  Surgeon: Wellington Hampshire, MD;  Location: [place] [facility];  Service: Cardiovascular;  Laterality: N/A;  . ELECTROPHYSIOLOGIC STUDY N/A 07/14/2015   Procedure: CARDIOVERSION;  Surgeon: Yolonda Kida, MD;  Location: [facility];  Service: Cardiovascular;  Laterality: N/A;  . ELECTROPHYSIOLOGIC STUDY N/A 10/12/2015   Procedure: CARDIOVERSION;  Surgeon: Minna Merritts, MD;  Location: [facility];  Service: Cardiovascular;  Laterality: N/A;  . LEFT HEART CATH AND CORONARY ANGIOGRAPHY N/A 10/31/2016   Procedure: Left Heart Cath and Coronary Angiography;  Surgeon: Wellington Hampshire, MD;  Location: [place] [facility];  Service: Cardiovascular;  Laterality: N/A;  . OTHER SURGICAL HISTORY  1998   Bypass    SOCIAL HISTORY:  Social History   Tobacco Use  . Smoking status: Former Smoker    Packs/day: 1.00    Years: 40.00    Pack years: 40.00    Types: Cigarettes, Pipe, Cigars    Last attempt to quit: 04/18/1972    Years since quitting: 45.3  . Smokeless tobacco: Former Systems developer    Types: Chew    Quit date: 04/18/1972  Substance Use Topics  . Alcohol use: Yes    Alcohol/week: 2.4 oz    Types: 2 Cans of beer, 2 Shots  of liquor per week    Comment: per 2 weeks     FAMILY HISTORY:  Family History  Family history unknown: Yes    DRUG ALLERGIES:  Allergies  Allergen Reactions  . Pravastatin Other (See Comments)  . Prednisone Other (See Comments)    Pt states that med makes him hyper Pt states that med makes him hyper    REVIEW OF SYSTEMS:   CONSTITUTIONAL: No fever, but complains of fatigue and generalized weakness.  EYES: No blurred or double vision.  EARS, NOSE, AND THROAT: No tinnitus or ear pain.  RESPIRATORY: Positive for productive cough, shortness of breath, wheezing; no hemoptysis.  CARDIOVASCULAR: No chest pain, orthopnea, edema.  GASTROINTESTINAL: No nausea, vomiting, diarrhea or abdominal pain.  GENITOURINARY: No dysuria, hematuria.  ENDOCRINE: No polyuria, nocturia,  HEMATOLOGY: No bleeding SKIN: No rash or lesion. MUSCULOSKELETAL: No joint pain or arthritis.   NEUROLOGIC: No focal weakness.  PSYCHIATRY: No anxiety or depression.   MEDICATIONS AT HOME:  Prior to Admission medications   Medication Sig Start Date End Date Taking? Authorizing Provider  acetaminophen (TYLENOL) 500 MG tablet Take 500 mg every 6 (six) hours as needed by mouth for mild pain or moderate pain.    Yes [provider]  carbidopa-levodopa (SINEMET) 25-250 MG tablet Take 2 tablets by mouth 3 (three) times daily. Patient taking differently: Take 1.5 tablets by mouth 3 (three) times daily.  02/01/17  Yes Kathrynn Ducking, MD  carvedilol (COREG) 6.25 MG tablet TAKE ONE TABLET BY MOUTH TWICE DAILY WITH A MEAL 07/24/17  Yes Wellington Hampshire, MD  clopidogrel (PLAVIX) 75 MG tablet TAKE ONE TABLET BY MOUTH EVERY DAY WITH BREAKFAST 07/06/17  Yes Wellington Hampshire, MD  cyclobenzaprine (FLEXERIL) 5 MG tablet Take 5 mg by mouth 3 (three) times daily as needed for muscle spasms.   Yes [provider]  ELIQUIS 5 MG TABS tablet TAKE ONE TABLET TWICE DAILY 02/06/17  Yes Wellington Hampshire, MD  entacapone  (COMTAN) 200 MG tablet Take 1 tablet (200 mg total) by mouth 3 (three) times daily. 03/29/17  Yes Kathrynn Ducking, MD  EPINEPHrine 0.3 mg/0.3 mL IJ SOAJ injection Inject 0.3 mg as directed once as needed (allergic reaction). Reported on 10/27/2015 07/01/13  Yes [provider]  esomeprazole (NEXIUM) 20 MG capsule Take 40 mg by mouth daily at 12 noon.   Yes [provider]  fluticasone (FLONASE) 50 MCG/ACT nasal spray TAKE 2 PUFFS IN EACH NOSTRIL EVERY DAY 10/18/16  Yes Cook, Jayce G, DO  furosemide (LASIX) 20 MG tablet Take 20 mg by mouth daily.   Yes [provider]  HYDROcodone-acetaminophen (NORCO/VICODIN) 5-325 MG tablet Take 1 tablet by mouth as needed.  06/16/17  Yes [provider]  isosorbide mononitrate (IMDUR) 60 MG 24 hr tablet Take 1 tablet (60 mg total) by mouth daily. 02/14/17  Yes Wellington Hampshire, MD  levothyroxine (SYNTHROID, LEVOTHROID) 50 MCG tablet Take 50  mcg by mouth daily before lunch.  09/07/16  Yes [provider]  losartan (COZAAR) 100 MG tablet Take 1 tablet (100 mg total) by mouth daily. 02/14/17  Yes Wellington Hampshire, MD  mirabegron ER (MYRBETRIQ) 50 MG TB24 tablet Take 50 mg by mouth daily.   Yes [provider]  rosuvastatin (CRESTOR) 10 MG tablet Take 10 mg by mouth at bedtime.   Yes [provider]  sertraline (ZOLOFT) 50 MG tablet 1 tablet daily for 2 weeks, then take 2 tablets daily Patient taking differently: Take 50 mg by mouth daily.  06/13/17  Yes Kathrynn Ducking, MD  VENTOLIN HFA 108 [id number] Base) MCG/ACT inhaler TAKE 2 PUFFS EVERY 8 HOURS AS NEEDED FORWHEEZING 12/02/16  Yes Cook, Jayce G, DO  furosemide (LASIX) 20 MG tablet Take 1 tablet (20 mg total) by mouth daily. 04/06/17 07/05/17  Theora Gianotti, NP  rosuvastatin (CRESTOR) 10 MG tablet Take 1 tablet (10 mg total) by mouth daily. 02/14/17 06/19/17  Wellington Hampshire, MD      PHYSICAL EXAMINATION:   VITAL SIGNS: Blood pressure (!) 166/81,  pulse 65, temperature 97.7 F (36.5 C), temperature source Oral, resp. rate (!) 21, height 5\' 8"  (1.727 m), weight 118.1 kg (260 lb 6.4 oz), SpO2 94 %.  GENERAL:  82 y.o.-year-old patient lying in the bed with mild respiratory distress.  EYES: Pupils equal, round, reactive to light and accommodation. No scleral icterus.   HEENT: Head atraumatic, normocephalic. Oropharynx and nasopharynx clear.  NECK:  Supple, no jugular venous distention. No thyroid enlargement, no tenderness.  LUNGS: Reduced breath sounds and scattered wheezing noted bilaterally. No use of accessory muscles of respiration, at this time.  CARDIOVASCULAR: S1, S2 normal. No S3/S4.  ABDOMEN: Soft, nontender, nondistended. Bowel sounds present. No organomegaly or mass.  EXTREMITIES: No pedal edema, cyanosis, or clubbing.  NEUROLOGIC: No focal weakness.  PSYCHIATRIC: The patient is alert and oriented x 3.  SKIN: No obvious rash, lesion, or ulcer.   LABORATORY PANEL:   CBC Recent Labs  Lab 08/10/17 2035  WBC 7.7  HGB 8.7*  HCT 26.7*  PLT 96*  MCV 94.1  MCH 30.5  MCHC 32.5  RDW 13.8  LYMPHSABS 1.3  MONOABS 0.8  EOSABS 0.1  BASOSABS 0.0   ------------------------------------------------------------------------------------------------------------------  Chemistries  Recent Labs  Lab 08/10/17 2035  NA 138  K 3.5  CL 102  CO2 27  GLUCOSE 139*  BUN 17  CREATININE 1.02  CALCIUM 8.4*   ------------------------------------------------------------------------------------------------------------------ estimated creatinine clearance is 73.3 mL/min (by C-G formula based on SCr of 1.02 mg/dL). ------------------------------------------------------------------------------------------------------------------ No results for input(s): TSH, T4TOTAL, T3FREE, THYROIDAB in the last 72 hours.  Invalid input(s): FREET3   Coagulation profile No results for input(s): INR, PROTIME in the last 168  hours. ------------------------------------------------------------------------------------------------------------------- No results for input(s): DDIMER in the last 72 hours. -------------------------------------------------------------------------------------------------------------------  Cardiac Enzymes Recent Labs  Lab 08/10/17 2035  TROPONINI <0.03   ------------------------------------------------------------------------------------------------------------------ Invalid input(s): POCBNP  ---------------------------------------------------------------------------------------------------------------  Urinalysis No results found for: COLORURINE, APPEARANCEUR, LABSPEC, PHURINE, GLUCOSEU, HGBUR, BILIRUBINUR, KETONESUR, PROTEINUR, UROBILINOGEN, NITRITE, LEUKOCYTESUR   RADIOLOGY: Dg Chest 2 View  Result Date: 08/10/2017 CLINICAL DATA:  Respiratory distress over the last week but worse over the last 3 days. Shortness of breath. EXAM: CHEST - 2 VIEW COMPARISON:  CT chest 05/30/2017.  Chest 10/28/2009 FINDINGS: Cardiac enlargement. No pulmonary vascular congestion. Coarse interstitial infiltrates with bronchial wall thickening demonstrating progression since previous study. This is likely to represent progressing fibrosis and bronchitic changes.  Can't exclude a component of interstitial edema superimposed on chronic fibrosis. Small right pleural effusion with infiltration or atelectasis in the right base. This could represent focal superimposed pneumonia. No pneumothorax. Calcified pleural plaques. Calcification of the aorta. Postoperative changes in the mediastinum. IMPRESSION: 1. Chronic appearing coarse interstitial infiltrates with bronchitic changes demonstrating progression since previous study. 2. Small right pleural effusion with infiltration or atelectasis in the right lung base possibly representing superimposed focal pneumonia. 3. Calcified pleural plaques. Combined with interstitial  changes in the lungs, this may indicate evidence of asbestosis. 4. Aortic atherosclerosis. Electronically Signed   By: Lucienne Capers M.D.   On: 08/10/2017 21:17    EKG: Orders placed or performed during the hospital encounter of 08/10/17  . ED EKG  . ED EKG  . EKG 12-Lead  . EKG 12-Lead    IMPRESSION AND PLAN:  1.  Acute hypoxic respiratory failure, likely related to pneumonia.  We will start IV antibiotics, nebulizer treatment, oxygen therapy and steroids IV.  Okay to discontinue BiPAP at this time, as patient has improved.  We will continue to monitor clinically closely.  2. CAP, see treatment as above under #1. 3.  Acute COPD exacerbation, see treatment as above under #1. 4.  Anemia, hemoglobin level is 8.7, from 14 few months ago.  No active bleeding.  Will consult gastroenterology for further evaluation for possible occult GI bleed.  We will continue Plavix, until July 2019, per cardiology instructions.  Will hold Eliquis for now.  Continue to monitor hemoglobin level closely. 5.  Parkinson's disease, stable, continue home medications.   All the records are reviewed and case discussed with ED provider. Management plans discussed with the patient and his in agreement.  CODE STATUS:    Code Status Orders  (From admission, onward)        Start     Ordered   08/11/17 0410  Full code  Continuous     08/11/17 0409    Code Status History    Date Active Date Inactive Code Status Order ID Comments User Context   10/28/2016 1219 11/02/2016 1323 Full Code [id number]  Demetrios Loll, MD Inpatient    Advance Directive Documentation     Most Recent Value  Type of Advance Directive  Living will, Healthcare Power of Attorney  Pre-existing out of facility DNR order (yellow form or pink MOST form)  -  "MOST" Form in Place?  -       TOTAL TIME TAKING CARE OF THIS PATIENT: 45 minutes.    Amelia Jo M.D on 08/11/2017 at 4:35 AM  Between 7am to 6pm - Pager - [phone]  After 6pm  go to [email or web address] - password [email or web address]  [facility]  Office  [phone]  CC: Primary care physician; [name], MD

## 2017-08-11 NOTE — Telephone Encounter (Signed)
This encounter was created in error - please disregard.

## 2017-08-11 NOTE — Plan of Care (Signed)
  Problem: Education: Goal: Knowledge of General Education information will improve Outcome: Progressing   Problem: Clinical Measurements: Goal: Will remain free from infection Outcome: Progressing   Problem: Pain Managment: Goal: General experience of comfort will improve Outcome: Progressing   Problem: Safety: Goal: Ability to remain free from injury will improve Outcome: Progressing   

## 2017-08-11 NOTE — Care Management (Signed)
Lives at home with his wife and independent in his adls.  Supplemental 02 requirements acute. Has home nebulizer. Would benefit from home health nurse follow up.

## 2017-08-11 NOTE — Consult Note (Signed)
Andre [name] , MD [address] [place], [place], [place], [address] [address], [place], [place], [place], [address] Phone: [phone]  Fax: [phone]  Consultation  Referring Provider:     No ref. provider found Primary Care Physician:  [name], MD Primary Gastroenterologist:  Dr. Gustavo Lah    Reason for Consultation:     Anemia   Date of Admission:  08/10/2017 Date of Consultation:  08/11/2017         HPI:   Andre [name] is a 82 y.o. male  Last colonoscopy and EGD in 07/2014 where barrettes esophagus with no dysplasia and adenomatous polyps were seen . On eloquis for A fibb. Last GI note mentioned some concerns of NAFLD. CAD with stent placement in July last year. Other medical issues he has are OSA, COPD,pulmonary fibrosis.He was admitted early this morning for shortness of breath , cough of 1 week duration. He was hypoxic on presentation, being treated for a pneumonia  , found to have a low Hb of 8.7 on admission but when rechecked is 13.6 grams this morning .    He denies any blood loss, normal brown stool this morning . He is short of breath at this time .   Past Medical History:  Diagnosis Date  . Cervical spondylosis 10/01/2013  . Chronic diastolic CHF (congestive heart failure) ([facility])    a. 07/2016 Echo: >55%; b. 10/2016 Echo: EF 55-60%, Gr1 DD, Ao sclerosis w/o stenosis, sev dil LA.  [name] Coronary artery disease    a. 1998 s/p mini-cabg @ [facility] - LIMA->LAD;  b. 07/2016 St Echo:  Inadequate HR (max 97) w/ hypertensive response (220/96). Ex time only 2:54 - stopped due to dyspnea and leg pain;  c.  08/2016 MV: EF 67%, no ischemia, low risk; d. 10/2016 NSTEMI/Cath: LM 40, LAD 100ost, RI 80, LCX nl, RCA 95p (4.0x26 Onyx DES), 67m, LIMA->LAD nl, EF 50-55%.  . Depression   . GERD (gastroesophageal reflux disease)   . Hyperlipidemia   . Hypertension   . PAF (paroxysmal atrial fibrillation) ([facility])    a. s/p DCCV-->maintaining sinus on amiodarone;  b. CHA2DS2VASc =  5-->eliquis.  . Parkinson's disease ([facility])    tremors  . Pulmonary embolism ([facility]) 2011  . 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 Sleep apnea    wears CPAP    Past Surgical History:  Procedure Laterality Date  . BACK SURGERY  1960  . CARDIAC CATHETERIZATION    . CHOLECYSTECTOMY  2010  . CORONARY ARTERY BYPASS GRAFT  01/07/1997  . CORONARY STENT INTERVENTION N/A 10/31/2016   Procedure: Coronary Stent Intervention;  Surgeon: Wellington Hampshire, MD;  Location: [facility];  Service: Cardiovascular;  Laterality: N/A;  . ELECTROPHYSIOLOGIC STUDY N/A 07/14/2015   Procedure: CARDIOVERSION;  Surgeon: Yolonda Kida, MD;  Location: [facility];  Service: Cardiovascular;  Laterality: N/A;  . ELECTROPHYSIOLOGIC STUDY N/A 10/12/2015   Procedure: CARDIOVERSION;  Surgeon: Minna Merritts, MD;  Location: [facility];  Service: Cardiovascular;  Laterality: N/A;  . LEFT HEART CATH AND CORONARY ANGIOGRAPHY N/A 10/31/2016   Procedure: Left Heart Cath and Coronary Angiography;  Surgeon: Wellington Hampshire, MD;  Location: [facility];  Service: Cardiovascular;  Laterality: N/A;  . OTHER SURGICAL HISTORY  1998   Bypass    Prior to Admission medications   Medication Sig Start Date End Date Taking? Authorizing Provider  acetaminophen (TYLENOL) 500 MG tablet Take 500 mg every 6 (six) hours as needed by mouth  for mild pain or moderate pain.    Yes [provider]  carbidopa-levodopa (SINEMET) 25-250 MG tablet Take 2 tablets by mouth 3 (three) times daily. Patient taking differently: Take 1.5 tablets by mouth 3 (three) times daily.  02/01/17  Yes Kathrynn Ducking, MD  carvedilol (COREG) 6.25 MG tablet TAKE ONE TABLET BY MOUTH TWICE DAILY WITH A MEAL 07/24/17  Yes Wellington Hampshire, MD  clopidogrel (PLAVIX) 75 MG tablet TAKE ONE TABLET BY MOUTH EVERY DAY WITH BREAKFAST 07/06/17  Yes Wellington Hampshire, MD  cyclobenzaprine (FLEXERIL) 5 MG tablet Take 5 mg by mouth 3 (three) times daily as needed  for muscle spasms.   Yes [provider]  ELIQUIS 5 MG TABS tablet TAKE ONE TABLET TWICE DAILY 02/06/17  Yes Wellington Hampshire, MD  entacapone (COMTAN) 200 MG tablet Take 1 tablet (200 mg total) by mouth 3 (three) times daily. 03/29/17  Yes Kathrynn Ducking, MD  EPINEPHrine 0.3 mg/0.3 mL IJ SOAJ injection Inject 0.3 mg as directed once as needed (allergic reaction). Reported on 10/27/2015 07/01/13  Yes [provider]  esomeprazole (NEXIUM) 20 MG capsule Take 40 mg by mouth daily at 12 noon.   Yes [provider]  fluticasone (FLONASE) 50 MCG/ACT nasal spray TAKE 2 PUFFS IN EACH NOSTRIL EVERY DAY 10/18/16  Yes Cook, Jayce G, DO  furosemide (LASIX) 20 MG tablet Take 20 mg by mouth daily.   Yes [provider]  HYDROcodone-acetaminophen (NORCO/VICODIN) 5-325 MG tablet Take 1 tablet by mouth as needed.  06/16/17  Yes [provider]  isosorbide mononitrate (IMDUR) 60 MG 24 hr tablet Take 1 tablet (60 mg total) by mouth daily. 02/14/17  Yes Wellington Hampshire, MD  levothyroxine (SYNTHROID, LEVOTHROID) 50 MCG tablet Take 50 mcg by mouth daily before lunch.  09/07/16  Yes [provider]  losartan (COZAAR) 100 MG tablet Take 1 tablet (100 mg total) by mouth daily. 02/14/17  Yes Wellington Hampshire, MD  mirabegron ER (MYRBETRIQ) 50 MG TB24 tablet Take 50 mg by mouth daily.   Yes [provider]  rosuvastatin (CRESTOR) 10 MG tablet Take 10 mg by mouth at bedtime.   Yes [provider]  sertraline (ZOLOFT) 50 MG tablet 1 tablet daily for 2 weeks, then take 2 tablets daily Patient taking differently: Take 50 mg by mouth daily.  06/13/17  Yes Kathrynn Ducking, MD  VENTOLIN HFA 108 [phone] Base) MCG/ACT inhaler TAKE 2 PUFFS EVERY 8 HOURS AS NEEDED FORWHEEZING 12/02/16  Yes Cook, Jayce G, DO  furosemide (LASIX) 20 MG tablet Take 1 tablet (20 mg total) by mouth daily. 04/06/17 07/05/17  Theora Gianotti, NP  rosuvastatin (CRESTOR) 10 MG tablet Take  1 tablet (10 mg total) by mouth daily. 02/14/17 06/19/17  Wellington Hampshire, MD    Family History  Family history unknown: Yes     Social History   Tobacco Use  . Smoking status: Former Smoker    Packs/day: 1.00    Years: 40.00    Pack years: 40.00    Types: Cigarettes, Pipe, Cigars    Last attempt to quit: 04/18/1972    Years since quitting: 45.3  . Smokeless tobacco: Former Systems developer    Types: Chew    Quit date: 04/18/1972  Substance Use Topics  . Alcohol use: Yes    Alcohol/week: 2.4 oz    Types: 2 Cans of beer, 2 Shots of liquor per week    Comment: per 2 weeks   .  Drug use: No    Allergies as of 08/10/2017 - Review Complete 08/10/2017  Allergen Reaction Noted  . Pravastatin Other (See Comments) 01/26/2015  . Prednisone Other (See Comments) 01/26/2015    Review of Systems:    All systems reviewed and negative except where noted in HPI.   Physical Exam:  Vital signs in last 24 hours: Temp:  [97.5 F (36.4 C)-97.7 F (36.5 C)] 97.6 F (36.4 C) (04/26 0810) Pulse Rate:  [56-65] 62 (04/26 0810) Resp:  [12-23] 20 (04/26 0810) BP: (119-166)/(58-81) 163/81 (04/26 0810) SpO2:  [94 %-98 %] 96 % (04/26 0810) Weight:  [260 lb 6.4 oz (118.1 kg)-270 lb (122.5 kg)] 260 lb 6.4 oz (118.1 kg) (04/26 0200)   General:   Pleasant, cooperative in NAD, sitting comfortably in his chair with oxygen .  Head:  Normocephalic and atraumatic. Eyes:   No icterus.   Conjunctiva pink. PERRLA. Ears:  Normal auditory acuity. Neck:  Supple; no masses or thyroidomegaly Lungs: Respirations even and decreased b/l  and unlabored.   No wheezes, crackles, or rhonchi.  Heart:  Regular rate and rhythm;  Without murmur, clicks, rubs or gallops Abdomen:  Soft, nondistended, nontender. Normal bowel sounds. No appreciable masses or hepatomegaly.  No rebound or guarding.  Neurologic:  Alert and oriented x3;  grossly normal neurologically. Skin:  Intact without significant lesions or rashes. Cervical Nodes:  No  significant cervical adenopathy. Psych:  Alert and cooperative. Normal affect.  LAB RESULTS: Recent Labs    08/10/17 2035 08/11/17 0601  WBC 7.7 9.7  HGB 8.7* 13.6  HCT 26.7* 41.0  PLT 96* PENDING   BMET Recent Labs    08/10/17 2035 08/11/17 0601  NA 138 141  K 3.5 3.9  CL 102 104  CO2 27 28  GLUCOSE 139* 178*  BUN 17 17  CREATININE 1.02 0.83  CALCIUM 8.4* 8.6*   LFT No results for input(s): PROT, ALBUMIN, AST, ALT, ALKPHOS, BILITOT, BILIDIR, IBILI in the last 72 hours. PT/INR No results for input(s): LABPROT, INR in the last 72 hours.  STUDIES: Dg Chest 2 View  Result Date: 08/10/2017 CLINICAL DATA:  Respiratory distress over the last week but worse over the last 3 days. Shortness of breath. EXAM: CHEST - 2 VIEW COMPARISON:  CT chest 05/30/2017.  Chest 10/28/2009 FINDINGS: Cardiac enlargement. No pulmonary vascular congestion. Coarse interstitial infiltrates with bronchial wall thickening demonstrating progression since previous study. This is likely to represent progressing fibrosis and bronchitic changes. Can't exclude a component of interstitial edema superimposed on chronic fibrosis. Small right pleural effusion with infiltration or atelectasis in the right base. This could represent focal superimposed pneumonia. No pneumothorax. Calcified pleural plaques. Calcification of the aorta. Postoperative changes in the mediastinum. IMPRESSION: 1. Chronic appearing coarse interstitial infiltrates with bronchitic changes demonstrating progression since previous study. 2. Small right pleural effusion with infiltration or atelectasis in the right lung base possibly representing superimposed focal pneumonia. 3. Calcified pleural plaques. Combined with interstitial changes in the lungs, this may indicate evidence of asbestosis. 4. Aortic atherosclerosis. Electronically Signed   By: Lucienne Capers M.D.   On: 08/10/2017 21:17      Impression / Plan:   DECOREY WAHLERT is a 82 y.o. y/o  male with H/o barrettes esophagus and colon polyps admitted with hypoxic respiratory failure with pnemonia being treated with antibiotics. I have been consulted for anemia. On admission the Hb was at 8.7 grams and subsequently when rechecked this morning is at 13.7 grams suggesting an  error in the sample on admission. He is on eloquis. If has no overt bleeding suggest resume Eloquis , monitor CBC and he should have an outpatient EGD and colonoscopy with Dr Gustavo Lah as an outpatient once his pneumonia has cleared.   I will sign off.  Please call me if any further GI concerns or questions.  We would like to thank you for the opportunity to participate in the care of [name].    Thank you for involving me in the care of this patient.      LOS: 0 days   Andre Bellows, MD  08/11/2017, 8:40 AM

## 2017-08-11 NOTE — ED Notes (Signed)
Patient taken off of Bi-PAP and placed on 2L [place] per Dr. Duane Boston. Continuous pulse ox in place. Will continue to monitor oxygenation level.

## 2017-08-11 NOTE — Progress Notes (Signed)
Initial Nutrition Assessment  DOCUMENTATION CODES:   Obesity unspecified  INTERVENTION:   Ensure Enlive po BID, each supplement provides 350 kcal and 20 grams of protein  MVI daily  Liberalize diet   NUTRITION DIAGNOSIS:   Increased nutrient needs related to chronic illness(COPD, pulmonary fibrosis, CHF) as evidenced by increased estimated needs from protein.  GOAL:   Patient will meet greater than or equal to 90% of their needs  MONITOR:   PO intake, Supplement acceptance, Labs, Weight trends, I & O's  REASON FOR ASSESSMENT:   Malnutrition Screening Tool    ASSESSMENT:   82 y.o. male history of CAD status post CABG on Plavix, COPD, pulmonary fibrosis, CHF with preserved EF, paroxysmal A. fib, hypertension, hyperlipidemia, OSA on CPAP, and pulmonary embolism on Eliquis who presents for evaluation of shortness of breath.     Met with pt in room today. Pt reports poor appetite and oral intake for 1 week r/t increased work from breathing. Pt reports that his appetite has been poor today. Pt drank an Ensure for breakfast today and was declining lunch. Per chart, pt is weight stable. RD will add vanilla and strawberry supplements and liberalize diet to increase oral intake.   Medications reviewed and include: colace, doxycycline, lasix, synthroid, solu-medrol, protonix, ceftriaxone   Labs reviewed: cbgs- 139, 178 x 24 hrs  NUTRITION - FOCUSED PHYSICAL EXAM:    Most Recent Value  Orbital Region  No depletion  Upper Arm Region  No depletion  Thoracic and Lumbar Region  No depletion  Buccal Region  No depletion  Temple Region  No depletion  Clavicle Bone Region  No depletion  Clavicle and Acromion Bone Region  No depletion  Scapular Bone Region  No depletion  Dorsal Hand  No depletion  Patellar Region  No depletion  Anterior Thigh Region  No depletion  Posterior Calf Region  No depletion  Edema (RD Assessment)  None  Hair  Reviewed  Eyes  Reviewed  Mouth  Reviewed   Skin  Reviewed  Nails  Reviewed     Diet Order:  Diet regular Room service appropriate? Yes; Fluid consistency: Thin  EDUCATION NEEDS:   Education needs have been addressed  Skin:  Skin Assessment: Reviewed RN Assessment  Last BM:  pta  Height:   Ht Readings from Last 1 Encounters:  08/11/17 5' 8"  (1.727 m)    Weight:   Wt Readings from Last 1 Encounters:  08/11/17 260 lb 6.4 oz (118.1 kg)    Ideal Body Weight:  70 kg  BMI:  Body mass index is 39.59 kg/m.  Estimated Nutritional Needs:   Kcal:  2000-2300kcal/day   Protein:  118-130g/day   Fluid:  >1.8L/day   Koleen Distance MS, RD, LDN Pager #[phone] After Hours Pager: [phone]

## 2017-08-12 LAB — HEMOGLOBIN: Hemoglobin: 12.8 g/dL — ABNORMAL LOW (ref 13.0–18.0)

## 2017-08-12 LAB — GLUCOSE, CAPILLARY: Glucose-Capillary: 175 mg/dL — ABNORMAL HIGH (ref 65–99)

## 2017-08-12 MED ORDER — DOXYCYCLINE HYCLATE 100 MG PO TABS: 100.0000 mg | ORAL_TABLET | Freq: Two times a day (BID) | ORAL | 0 refills | Status: DC

## 2017-08-12 MED ORDER — DEXAMETHASONE 4 MG PO TABS
4.0000 mg | ORAL_TABLET | Freq: Three times a day (TID) | ORAL | Status: DC
  Administered 2017-08-12 (×2): 4 mg via ORAL
  Filled 2017-08-12 (×3): qty 1

## 2017-08-12 MED ORDER — DEXAMETHASONE 4 MG PO TABS: 4.0000 mg | ORAL_TABLET | Freq: Three times a day (TID) | ORAL | 0 refills | Status: DC

## 2017-08-12 MED ORDER — AMOXICILLIN-POT CLAVULANATE 875-125 MG PO TABS: 1.0000 | ORAL_TABLET | Freq: Two times a day (BID) | ORAL | 0 refills | Status: DC

## 2017-08-12 MED ORDER — AMOXICILLIN-POT CLAVULANATE 875-125 MG PO TABS
1.0000 | ORAL_TABLET | Freq: Two times a day (BID) | ORAL | Status: DC
  Administered 2017-08-12 (×2): 1 via ORAL
  Filled 2017-08-12 (×2): qty 1

## 2017-08-12 NOTE — Progress Notes (Signed)
Information was faxed to [facility] for oxygen need.

## 2017-08-12 NOTE — Progress Notes (Signed)
Portable 02 has just been delivered by [facility].  Patient denied any pain. No SOB noted. Patient left the hospital accompanied by empolyee ( NT) and her daughter.  IVs removed.

## 2017-08-12 NOTE — Evaluation (Signed)
Physical Therapy Evaluation Patient Details Name: MOUSA PROUT MRN: [id number] DOB: 08-22-1935 Today's Date: 08/12/2017   History of Present Illness  Patient is a 82 year old male admitted for respiratory distress following c/o productive cough and SOB. PMH includes PD, PE, PAF, Htn, HLD, GERD, depressoin, CAD, CHF, Cx spondylosis and a fall in January 2019.  Clinical Impression  Pt is a 82 year old male who lives in a one story home with his wife.  He is indepdendent and able to drive at baseline.  Pt in chair upon PT arrival and reports no pain.  Pt is not wearing O2 cannula and PT consulted RN, replaced cannula on pt and checked O2, which was above 90%.  Pt able to perform STS indepdendently and ambulate in room without AD. He demonstrated ability to perform static balance without use of UE for support. Pt has a fall hx and states that he sometimes uses furniture for support and PT educated pt concerning use of SPC and fall prevention.  Pt strength UE and LE is WNL bilaterally. Pt is not appropriate for PT at this time but may benefit from [facility] pulmonary rehab program.    Follow Up Recommendations No PT follow up    Equipment Recommendations  None recommended by PT    Recommendations for Other Services       Precautions / Restrictions Precautions Precautions: Fall Restrictions Weight Bearing Restrictions: No      Mobility  Bed Mobility Overal bed mobility: (In chair upon PT arrival)                Transfers Overall transfer level: Independent                  Ambulation/Gait Ambulation/Gait assistance: Supervision Ambulation Distance (Feet): 75 Feet       Gait velocity interpretation: 1.31 - 2.62 ft/sec, indicative of limited community ambulator General Gait Details: Pt just completed walking around nurses' station without AD prior to PT arrival. Pt able to walk in room without AD, moderate foot clearance and good knee and hip flexion during  swing phase.  PT discussed use of SPC for longer distance walking due to fall hx and pt stating that he sometimes uses furniture for support.  Stairs            Wheelchair Mobility    Modified Rankin (Stroke Patients Only)       Balance Overall balance assessment: Independent(Able to bed to lift object from floor holding counter wtih unilateral UE. Able to reach overhead as though getting something out of a cabinet bilateral UE with no LOB.  Static stance: Feet together: EO: 10 sec, EC: 10 sec with slighlty increased sway.)                                           Pertinent Vitals/Pain Pain Assessment: No/denies pain    Home Living Family/patient expects to be discharged to:: Private residence Living Arrangements: Spouse/significant other Available Help at Discharge: Family;Available 24 hours/day Type of Home: House Home Access: Stairs to enter Entrance Stairs-Rails: Can reach both Entrance Stairs-Number of Steps: 2 Home Layout: One level Home Equipment: Walker - 2 wheels Additional Comments: Able to community ambulate without RW.    Prior Function Level of Independence: Independent               Hand Dominance  Extremity/Trunk Assessment   Upper Extremity Assessment Upper Extremity Assessment: Overall WFL for tasks assessed    Lower Extremity Assessment Lower Extremity Assessment: Overall WFL for tasks assessed    Cervical / Trunk Assessment Cervical / Trunk Assessment: Normal  Communication   Communication: No difficulties  Cognition Arousal/Alertness: Awake/alert Behavior During Therapy: WFL for tasks assessed/performed Overall Cognitive Status: Within Functional Limits for tasks assessed                                        General Comments      Exercises Other Exercises Other Exercises: Educated pt concerning use of SPC and fall prevention. Pt states that he has a SPC available. Other Exercises:  Educated pt concerning nature of pulmonary rehab vs physical therapy and pt expressed understanding.   Assessment/Plan    PT Assessment Patent does not need any further PT services  PT Problem List Decreased balance       PT Treatment Interventions      PT Goals (Current goals can be found in the Care Plan section)  Acute Rehab PT Goals PT Goal Formulation: All assessment and education complete, DC therapy    Frequency Min 2X/week   Barriers to discharge        Co-evaluation               AM-PAC PT "6 Clicks" Daily Activity  Outcome Measure Difficulty turning over in bed (including adjusting bedclothes, sheets and blankets)?: None Difficulty moving from lying on back to sitting on the side of the bed? : None Difficulty sitting down on and standing up from a chair with arms (e.g., wheelchair, bedside commode, etc,.)?: None Help needed moving to and from a bed to chair (including a wheelchair)?: None Help needed walking in hospital room?: None Help needed climbing 3-5 steps with a railing? : None 6 Click Score: 24    End of Session Equipment Utilized During Treatment: Oxygen Activity Tolerance: Patient tolerated treatment well Patient left: in chair;with call bell/phone within reach;with chair alarm set Nurse Communication: Mobility status PT Visit Diagnosis: Muscle weakness (generalized) (M62.81);History of falling (Z91.81)    Time: 1130-1150 PT Time Calculation (min) (ACUTE ONLY): 20 min   Charges:   PT Evaluation $PT Eval Low Complexity: 1 Low     PT G Codes:   PT G-Codes **NOT FOR INPATIENT CLASS** Functional Assessment Tool Used: AM-PAC 6 Clicks Basic Mobility    Roxanne Gates, PT, DPT   Roxanne Gates 08/12/2017, 12:02 PM

## 2017-08-12 NOTE — Discharge Summary (Addendum)
[facility] [place] NAME: Andre [name]    MR#:  [id number]  DATE OF BIRTH:  [birth year]  DATE OF ADMISSION:  08/11/78 ADMITTING PHYSICIAN: Amelia Jo, MD  DATE OF DISCHARGE: No discharge date for patient encounter.  PRIMARY CARE PHYSICIAN: [name], MD    ADMISSION DIAGNOSIS:  COPD exacerbation ([place]) [J44.1] Acute respiratory failure with hypoxia ([place]) [J96.01] Anemia, unspecified type [D64.9] Community acquired pneumonia, unspecified laterality [J18.9]  DISCHARGE DIAGNOSIS:  Active Problems:   Acute respiratory failure ([place])   SECONDARY DIAGNOSIS:   Past Medical History:  Diagnosis Date  . Cervical spondylosis 10/01/2013  . Chronic diastolic CHF (congestive heart failure) ([place])    a. 07/2016 Echo: >55%; b. 10/2016 Echo: EF 55-60%, Gr1 DD, Ao sclerosis w/o stenosis, sev dil LA.  [name] Coronary artery disease    a. 1998 s/p mini-cabg @ [facility] - LIMA->LAD;  b. 07/2016 St Echo:  Inadequate HR (max 97) w/ hypertensive response (220/96). Ex time only 2:54 - stopped due to dyspnea and leg pain;  c.  08/2016 MV: EF 67%, no ischemia, low risk; d. 10/2016 NSTEMI/Cath: LM 40, LAD 100ost, RI 80, LCX nl, RCA 95p (4.0x26 Onyx DES), 27m, LIMA->LAD nl, EF 50-55%.  . Depression   . GERD (gastroesophageal reflux disease)   . Hyperlipidemia   . Hypertension   . PAF (paroxysmal atrial fibrillation) (H[facility])    a. s/p DCCV-->maintaining sinus on amiodarone;  b. CHA2DS2VASc = 5-->eliquis.  . Parkinson's disease ([place])    tremors  . Pulmonary embolism ([place]) 2011  . 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 Sleep apnea    wears CPAP    HOSPITAL COURSE:  1.  Acute hypoxic respiratory failure Stable likely related to pneumonia, COPD, pulmonary fibrosis Patient did qualify for home oxygen-for 2 L via nasal cannula continuous    2. CAP Resolving Treated on our pneumonia protocol To complete Augmentin and doxycycline course status post  discharge  3.  Acute COPD exacerbation Resolved Exacerbated by pneumonia Treated with supplemental oxygen weaning as tolerated per above, pneumonia treatment per above, breathing treatments as needed, and patient did well  4.  Anemia Stable-hemoglobin 12.8 at discharge Gastroenterology to see patient while in-house-recommended outpatient follow-up status post discharge for possible EGD/colonoscopy on outpatient basis  5.  Parkinson's disease Stable continue home medications.  DISCHARGE CONDITIONS:  On the day of discharge patient is afebrile, stable, tolerating diet, ready for discharge home, follow-up with pulmonology and primary care provider as directed, for specific details please see chart   CONSULTS OBTAINED:    DRUG ALLERGIES:   Allergies  Allergen Reactions  . Pravastatin Other (See Comments)  . Prednisone Other (See Comments)    Pt states that med makes him hyper Pt states that med makes him hyper    DISCHARGE MEDICATIONS:   Allergies as of 08/12/2017      Reactions   Pravastatin Other (See Comments)   Prednisone Other (See Comments)   Pt states that med makes him hyper Pt states that med makes him hyper      Medication List    TAKE these medications   amoxicillin-clavulanate 875-125 MG tablet Commonly known as:  AUGMENTIN Take 1 tablet by mouth every 12 (twelve) hours.   carbidopa-levodopa 25-250 MG tablet Commonly known as:  SINEMET Take 2 tablets by mouth 3 (three) times daily. What changed:  how much to take   carvedilol 6.25 MG tablet Commonly known as:  COREG TAKE ONE TABLET  BY MOUTH TWICE DAILY WITH A MEAL   clopidogrel 75 MG tablet Commonly known as:  PLAVIX TAKE ONE TABLET BY MOUTH EVERY DAY WITH BREAKFAST   cyclobenzaprine 5 MG tablet Commonly known as:  FLEXERIL Take 5 mg by mouth 3 (three) times daily as needed for muscle spasms.   dexamethasone 4 MG tablet Commonly known as:  DECADRON Take 1 tablet (4 mg total) by mouth every 8  (eight) hours.   doxycycline 100 MG tablet Commonly known as:  VIBRA-TABS Take 1 tablet (100 mg total) by mouth every 12 (twelve) hours.   ELIQUIS 5 MG Tabs tablet Generic drug:  apixaban TAKE ONE TABLET TWICE DAILY   entacapone 200 MG tablet Commonly known as:  COMTAN Take 1 tablet (200 mg total) by mouth 3 (three) times daily.   EPINEPHrine 0.3 mg/0.3 mL Soaj injection Commonly known as:  EPI-PEN Inject 0.3 mg as directed once as needed (allergic reaction). Reported on 10/27/2015   esomeprazole 20 MG capsule Commonly known as:  NEXIUM Take 40 mg by mouth daily at 12 noon.   fluticasone 50 MCG/ACT nasal spray Commonly known as:  FLONASE TAKE 2 PUFFS IN EACH NOSTRIL EVERY DAY   furosemide 20 MG tablet Commonly known as:  LASIX Take 20 mg by mouth daily. What changed:  Another medication with the same name was removed. Continue taking this medication, and follow the directions you see here.   HYDROcodone-acetaminophen 5-325 MG tablet Commonly known as:  NORCO/VICODIN Take 1 tablet by mouth as needed.   isosorbide mononitrate 60 MG 24 hr tablet Commonly known as:  IMDUR Take 1 tablet (60 mg total) by mouth daily.   levothyroxine 50 MCG tablet Commonly known as:  SYNTHROID, LEVOTHROID Take 50 mcg by mouth daily before lunch.   losartan 100 MG tablet Commonly known as:  COZAAR Take 1 tablet (100 mg total) by mouth daily.   MYRBETRIQ 50 MG Tb24 tablet Generic drug:  mirabegron ER Take 50 mg by mouth daily.   rosuvastatin 10 MG tablet Commonly known as:  CRESTOR Take 10 mg by mouth at bedtime. What changed:  Another medication with the same name was removed. Continue taking this medication, and follow the directions you see here.   sertraline 50 MG tablet Commonly known as:  ZOLOFT 1 tablet daily for 2 weeks, then take 2 tablets daily What changed:    how much to take  how to take this  when to take this  additional instructions   TYLENOL 500 MG  tablet Generic drug:  acetaminophen Take 500 mg every 6 (six) hours as needed by mouth for mild pain or moderate pain.   VENTOLIN HFA 108 (90 Base) MCG/ACT inhaler Generic drug:  albuterol TAKE 2 PUFFS EVERY 8 HOURS AS NEEDED FORWHEEZING        DISCHARGE INSTRUCTIONS:  If you experience worsening of your admission symptoms, develop shortness of breath, life threatening emergency, suicidal or homicidal thoughts you must seek medical attention immediately by calling 911 or calling your MD immediately  if symptoms less severe.  You Must read complete instructions/literature along with all the possible adverse reactions/side effects for all the Medicines you take and that have been prescribed to you. Take any new Medicines after you have completely understood and accept all the possible adverse reactions/side effects.   Please note  You were cared for by a hospitalist during your hospital stay. If you have any questions about your discharge medications or the care you received while you  were in the hospital after you are discharged, you can call the unit and asked to speak with the hospitalist on call if the hospitalist that took care of you is not available. Once you are discharged, your primary care physician will handle any further medical issues. Please note that NO REFILLS for any discharge medications will be authorized once you are discharged, as it is imperative that you return to your primary care physician (or establish a relationship with a primary care physician if you do not have one) for your aftercare needs so that they can reassess your need for medications and monitor your lab values.    Today   CHIEF COMPLAINT:   Chief Complaint  Patient presents with  . Respiratory Distress    HISTORY OF PRESENT ILLNESS:  82 y.o. male with a known history of Parkinson's disease, diastolic CHF, hypertension, paroxysmal atrial fibrillation on Eliquis, coronary artery disease status post  stent placement last year in July, obstructive sleep apnea, COPD, pulmonary fibrosis. Patient presented to emergency room for acute onset of shortness of breath and productive cough, going on for the past week, gradually getting worse.  Patient has tried nebulizer treatment at home without any improvement.  He denies any chest pain, nausea, vomiting, diarrhea.  No fever or chills at home Patient does not use oxygen at baseline, but his oxygen saturation was 80% on room air at the arrival to emergency room.  He improved with BiPAP treatment, nebulizers and steroids IV received in the emergency room. Blood test done emergency room showed low hemoglobin level at 8.7, but otherwise mostly unremarkable.  Patient denies any active bleeding. Chest x-ray, reviewed by myself, shows right lung base infiltrate. Patient is admitted for further evaluation and treatment.  VITAL SIGNS:  Blood pressure (!) 153/77, pulse 66, temperature 98.6 F (37 C), temperature source Oral, resp. rate 20, height 5\' 8"  (1.727 m), weight 117.3 kg (258 lb 8 oz), SpO2 95 %.  I/O:    Intake/Output Summary (Last 24 hours) at 08/12/2017 1243 Last data filed at 08/12/2017 1008 Gross per 24 hour  Intake 720 ml  Output -  Net 720 ml    PHYSICAL EXAMINATION:  GENERAL:  82 y.o.-year-old patient lying in the bed with no acute distress.  EYES: Pupils equal, round, reactive to light and accommodation. No scleral icterus. Extraocular muscles intact.  HEENT: Head atraumatic, normocephalic. Oropharynx and nasopharynx clear.  NECK:  Supple, no jugular venous distention. No thyroid enlargement, no tenderness.  LUNGS: Normal breath sounds bilaterally, no wheezing, rales,rhonchi or crepitation. No use of accessory muscles of respiration.  CARDIOVASCULAR: S1, S2 normal. No murmurs, rubs, or gallops.  ABDOMEN: Soft, non-tender, non-distended. Bowel sounds present. No organomegaly or mass.  EXTREMITIES: No pedal edema, cyanosis, or clubbing.   NEUROLOGIC: Cranial nerves II through XII are intact. Muscle strength 5/5 in all extremities. Sensation intact. Gait not checked.  PSYCHIATRIC: The patient is alert and oriented x 3.  SKIN: No obvious rash, lesion, or ulcer.   DATA REVIEW:   CBC Recent Labs  Lab 08/11/17 0601 08/12/17 0453  WBC 9.7  --   HGB 13.6 12.8*  HCT 41.0  --   PLT 148*  --     Chemistries  Recent Labs  Lab 08/11/17 0601 08/11/17 0814  NA 141  --   K 3.9  --   CL 104  --   CO2 28  --   GLUCOSE 178*  --   BUN 17  --  CREATININE 0.83  --   CALCIUM 8.6*  --   MG  --  2.0    Cardiac Enzymes Recent Labs  Lab 08/10/17 2035  TROPONINI <0.03    Microbiology Results  Results for orders placed or performed during the hospital encounter of 08/10/17  Blood culture (routine x 2)     Status: None (Preliminary result)   Collection Time: 08/10/17 10:43 PM  Result Value Ref Range Status   Specimen Description BLOOD LEFT HAND  Final   Special Requests   Final    BOTTLES DRAWN AEROBIC AND ANAEROBIC Blood Culture results may not be optimal due to an excessive volume of blood received in culture bottles   Culture   Final    NO GROWTH 2 DAYS Performed at [facility], [address] [place], [place] [address]    Report Status PENDING  Incomplete  Blood culture (routine x 2)     Status: None (Preliminary result)   Collection Time: 08/10/17 10:43 PM  Result Value Ref Range Status   Specimen Description BLOOD RIGHT HAND  Final   Special Requests   Final    BOTTLES DRAWN AEROBIC AND ANAEROBIC Blood Culture results may not be optimal due to an excessive volume of blood received in culture bottles   Culture   Final    NO GROWTH 2 DAYS Performed at [facility], [address] [place], [place] [address]    Report Status PENDING  Incomplete    RADIOLOGY:  Dg Chest 2 View  Result Date: 08/10/2017 CLINICAL DATA:  Respiratory distress over the last week but worse over the last 3  days. Shortness of breath. EXAM: CHEST - 2 VIEW COMPARISON:  CT chest 05/30/2017.  Chest 10/28/2009 FINDINGS: Cardiac enlargement. No pulmonary vascular congestion. Coarse interstitial infiltrates with bronchial wall thickening demonstrating progression since previous study. This is likely to represent progressing fibrosis and bronchitic changes. Can't exclude a component of interstitial edema superimposed on chronic fibrosis. Small right pleural effusion with infiltration or atelectasis in the right base. This could represent focal superimposed pneumonia. No pneumothorax. Calcified pleural plaques. Calcification of the aorta. Postoperative changes in the mediastinum. IMPRESSION: 1. Chronic appearing coarse interstitial infiltrates with bronchitic changes demonstrating progression since previous study. 2. Small right pleural effusion with infiltration or atelectasis in the right lung base possibly representing superimposed focal pneumonia. 3. Calcified pleural plaques. Combined with interstitial changes in the lungs, this may indicate evidence of asbestosis. 4. Aortic atherosclerosis. Electronically Signed   By: Lucienne Capers M.D.   On: 08/10/2017 21:17    EKG:   Orders placed or performed during the hospital encounter of 08/10/17  . ED EKG  . ED EKG  . EKG 12-Lead  . EKG 12-Lead      Management plans discussed with the patient, family and they are in agreement.  CODE STATUS:     Code Status Orders  (From admission, onward)        Start     Ordered   08/11/17 0410  Full code  Continuous     08/11/17 0409    Code Status History    Date Active Date Inactive Code Status Order ID Comments User Context   10/28/2016 1219 11/02/2016 1323 Full Code [id number]  Demetrios Loll, MD Inpatient    Advance Directive Documentation     Most Recent Value  Type of Advance Directive  Living will, Healthcare Power of Attorney  Pre-existing out of facility DNR order (yellow form or pink  MOST form)  -   "MOST" Form in Place?  -      TOTAL TIME TAKING CARE OF THIS PATIENT: 45 minutes.    Avel Peace Salary M.D on 08/12/2017 at 12:43 PM  Between 7am to 6pm - Pager - [phone]  After 6pm go to [email or web address] - password [email or web address] [place] [facility]  Office  [phone]  CC: Primary care physician; [name], MD   Note: This dictation was prepared with Dragon dictation along with smaller phrase technology. Any transcriptional errors that result from this process are unintentional.

## 2017-08-12 NOTE — Progress Notes (Signed)
SATURATION QUALIFICATIONS: (This note is used to comply with regulatory documentation for home oxygen)  Patient Saturations on Room Air at Rest = 86%  Patient Saturations on Room Air while Ambulating = na%  Patient Saturations on 2 Liters of oxygen at rest = 94%  Please briefly explain why patient needs home oxygen:

## 2017-08-12 NOTE — Progress Notes (Signed)
Andre [name] daughter was updated on delivery time between 4-8 pm. Pt was made aware of estimated delivery time.

## 2017-08-14 ENCOUNTER — Telehealth: Payer: Self-pay | Admitting: Family Medicine

## 2017-08-14 ENCOUNTER — Telehealth: Payer: Self-pay | Admitting: Cardiovascular Disease

## 2017-08-14 NOTE — Telephone Encounter (Signed)
Schedule a follow-up visit with Andre [name] or Andre [name].  He is also scheduled for an echocardiogram in about 2 weeks from now.

## 2017-08-14 NOTE — Telephone Encounter (Signed)
Copied from [place] [phone]. Topic: Appointment Scheduling - Scheduling Inquiry for Clinic >> Aug 14, 2017 11:28 AM Margot Ables wrote: Reason for CRM: pt wife calling stating pt was in the hospital from 08/10/17/-08/12/17. Pt was advised to follow up with Dr. [name] within 5 days. Pt was taken to hospital via ambulance. Pt has pneumonia. Patient requesting call to discuss medication changes at discharge. Please advise.

## 2017-08-14 NOTE — Telephone Encounter (Signed)
Patient wife calling to let us know patient was admitted to hospital for breathing issues and pneumonia  Now they are not sure if an echo is needed please call to discuss.

## 2017-08-14 NOTE — Telephone Encounter (Signed)
Transition Care Management Follow-up Telephone Call  How have you been since you were released from the hospital? Patient stated he is feeling a whole alot better.   Do you understand why you were in the hospital? yes   Do you understand the discharge instrcutions? yes  Items Reviewed:  Medications reviewed: yes, several medications changes made during admission, discussed with caregiver .  Allergies reviewed: yes  Dietary changes reviewed: yes  Referrals reviewed: yes   Functional Questionnaire:   Activities of Daily Living (ADLs):   He states they are independent in the following: ambulation, bathing and hygiene, feeding, continence, grooming, toileting and dressing States they require assistance with the following: said he needs no help with adls.   Any transportation issues/concerns?: no   Any patient concerns? Yes, how long he needs to continue to wear 02 advised patient until see PCP at least in office.   Confirmed importance and date/time of follow-up visits scheduled: yes   Confirmed with patient if condition begins to worsen call PCP or go to the ER.  Patient was given the Call-a-Nurse line [phone]: yes

## 2017-08-14 NOTE — Telephone Encounter (Signed)
Patient was admitted to the hospital from ER.

## 2017-08-14 NOTE — Telephone Encounter (Signed)
Spoke with patient and his wife via speaker phone. They both expressed concerns regarding patient breathing and what they should do from here. Reviewed that admission was listed as pneumonia. They want to know if they need to do something else. Advised that I would send this message over to his provider for any recommendations. She was appreciative for the call with no further questions.

## 2017-08-14 NOTE — Telephone Encounter (Signed)
The only available appointment was Monday 08/21/17 at 8:30 am this ok hospital recommendation ws one week from discharge.

## 2017-08-14 NOTE — Telephone Encounter (Signed)
That is fine 

## 2017-08-15 ENCOUNTER — Telehealth: Payer: Self-pay | Admitting: Internal Medicine

## 2017-08-15 LAB — CULTURE, BLOOD (ROUTINE X 2)
Culture: NO GROWTH
Culture: NO GROWTH

## 2017-08-15 NOTE — Telephone Encounter (Signed)
Patient wife calling Patient has been in hospital recently In the hospital he was put on oxygen He was sent home from the hospital with oxygen as well Patient would like to know why he is on oxygen Please call to discuss

## 2017-08-15 NOTE — Telephone Encounter (Signed)
Will notify patient at his appointment, unable to reach per phone

## 2017-08-16 DIAGNOSIS — G4733 Obstructive sleep apnea (adult) (pediatric): Secondary | ICD-10-CM | POA: Diagnosis not present

## 2017-08-16 NOTE — Telephone Encounter (Signed)
Pt scheduled for 09/05/17 with Ignacia Bayley

## 2017-08-16 NOTE — Telephone Encounter (Signed)
LMTCB

## 2017-08-16 NOTE — Telephone Encounter (Signed)
HFU scheduled for 5/9

## 2017-08-21 ENCOUNTER — Encounter: Payer: Self-pay | Admitting: Family Medicine

## 2017-08-21 ENCOUNTER — Ambulatory Visit (INDEPENDENT_AMBULATORY_CARE_PROVIDER_SITE_OTHER): Payer: PPO | Admitting: Family Medicine

## 2017-08-21 ENCOUNTER — Telehealth: Payer: Self-pay | Admitting: *Deleted

## 2017-08-21 VITALS — BP 130/70 | HR 57 | Temp 97.5°F | Ht 66.0 in | Wt 253.6 lb

## 2017-08-21 DIAGNOSIS — J189 Pneumonia, unspecified organism: Secondary | ICD-10-CM | POA: Diagnosis not present

## 2017-08-21 DIAGNOSIS — R197 Diarrhea, unspecified: Secondary | ICD-10-CM | POA: Diagnosis not present

## 2017-08-21 DIAGNOSIS — D72829 Elevated white blood cell count, unspecified: Secondary | ICD-10-CM

## 2017-08-21 DIAGNOSIS — J309 Allergic rhinitis, unspecified: Secondary | ICD-10-CM | POA: Diagnosis not present

## 2017-08-21 DIAGNOSIS — D649 Anemia, unspecified: Secondary | ICD-10-CM | POA: Insufficient documentation

## 2017-08-21 DIAGNOSIS — J9601 Acute respiratory failure with hypoxia: Secondary | ICD-10-CM | POA: Diagnosis not present

## 2017-08-21 DIAGNOSIS — K2270 Barrett's esophagus without dysplasia: Secondary | ICD-10-CM | POA: Diagnosis not present

## 2017-08-21 LAB — CBC
HCT: 46.8 % (ref 39.0–52.0)
Hemoglobin: 15.5 g/dL (ref 13.0–17.0)
MCHC: 33.1 g/dL (ref 30.0–36.0)
MCV: 92.5 fl (ref 78.0–100.0)
Platelets: 211 10*3/uL (ref 150.0–400.0)
RBC: 5.06 Mil/uL (ref 4.22–5.81)
RDW: 14 % (ref 11.5–15.5)
WBC: 18.4 10*3/uL (ref 4.0–10.5)

## 2017-08-21 NOTE — Assessment & Plan Note (Signed)
I suspect this was lab error.  We will recheck today.  He will need to follow-up with GI as planned in July.

## 2017-08-21 NOTE — Assessment & Plan Note (Addendum)
Patient seen in follow-up from hospitalization for acute respiratory failure attributed to COPD exacerbation and pneumonia.  He appears to be doing quite well now.  He is asymptomatic.  He has completed his therapy.  He will keep his follow-up appointment with pulmonology later this week.  He will return in 3 weeks for follow-up chest x-ray.  Given return precautions.

## 2017-08-21 NOTE — Patient Instructions (Addendum)
Nice to see you. I am glad you are doing well. Please keep your appointment with pulmonology. Please make sure you see GI in July. We will have you return in 3 weeks for repeat chest x-ray. We will check labs today and contact you with the results.

## 2017-08-21 NOTE — Progress Notes (Signed)
Andre Rumps, MD Phone: [phone]  Andre [name] is a 82 y.o. male who presents today for hospital follow-up.  Patient was hospitalized from 08/11/2017-08/12/2017 for shortness of breath.  He was diagnosed with a COPD exacerbation and pneumonia.  He was treated with antibiotics and discharged on Augmentin and doxycycline.  He feels better than he has in some time.  He notes no dyspnea.  No cough.  No fevers.  He did complete the antibiotic therapy as well as prednisone.  He does note occasional postnasal drip and rhinorrhea which responds to Flonase.  He reports some watery stools with the antibiotics though those have resolved.  No blood in his stool.  He was discharged on home oxygen though feels as though he does not need this and has not been wearing it.  He does wear his CPAP at night.  He was found to be anemic with a hemoglobin of 8.7 though this would seem to have been lab error given that it rebounded to the normal range at 13.6 the next day.  GI saw him and recommended that he follow-up outpatient with his GI physician.  He cannot have repeat endoscopies until July after he has been on Plavix for a year.  Discharge summary reviewed.  Medications reviewed.  Social History   Tobacco Use  Smoking Status Former Smoker  . Packs/day: 1.00  . Years: 40.00  . Pack years: 40.00  . Types: Cigarettes, Pipe, Cigars  . Last attempt to quit: 04/18/1972  . Years since quitting: 45.3  Smokeless Tobacco Former Systems developer  . Types: Chew  . Quit date: 04/18/1972     ROS see history of present illness  Objective  Physical Exam Vitals:   08/21/17 0824  BP: 130/70  Pulse: (!) 57  Temp: (!) 97.5 F (36.4 C)  SpO2: 97%    BP Readings from Last 3 Encounters:  08/21/17 130/70  08/12/17 (!) 155/83  07/24/17 (!) 189/88   Wt Readings from Last 3 Encounters:  08/21/17 253 lb 9.6 oz (115 kg)  08/12/17 258 lb 8 oz (117.3 kg)  07/24/17 271 lb 8 oz (123.2 kg)    Physical Exam  Constitutional: No  distress.  HENT:  Head: Normocephalic and atraumatic.  Mouth/Throat: Oropharynx is clear and moist. No oropharyngeal exudate.  Eyes: Pupils are equal, round, and reactive to light. Conjunctivae are normal.  Neck: Neck supple.  Cardiovascular: Normal rate, regular rhythm and normal heart sounds.  Pulmonary/Chest: Effort normal and breath sounds normal.  Abdominal: Soft. Bowel sounds are normal. He exhibits no distension. There is no tenderness.  Musculoskeletal: He exhibits no edema.  Lymphadenopathy:    He has no cervical adenopathy.  Neurological: He is alert.  Skin: Skin is warm and dry. He is not diaphoretic.     Assessment/Plan: Please see individual problem list.  Acute respiratory failure ([place]) Patient seen in follow-up from hospitalization for acute respiratory failure attributed to COPD exacerbation and pneumonia.  He appears to be doing quite well now.  He is asymptomatic.  He has completed his therapy.  He will keep his follow-up appointment with pulmonology later this week.  He will return in 3 weeks for follow-up chest x-ray.  Given return precautions.  Allergic rhinitis Patient with some allergy symptoms.  Encourage Flonase use.  Diarrhea This is resolved.  Benign abdominal exam.  Suspect related to his antibiotic use.  If it recurs he will let us know.  Anemia I suspect this was lab error.  We will recheck  today.  He will need to follow-up with GI as planned in July.  Barrett's esophagus He will need to have follow-up in July with GI.  He is aware of this.  Orders Placed This Encounter  Procedures  . DG Chest 2 View    Standing Status:   Future    Standing Expiration Date:   10/22/2018    Order Specific Question:   Reason for Exam (SYMPTOM  OR DIAGNOSIS REQUIRED)    Answer:   PNA follow-up    Order Specific Question:   Preferred imaging location?    Answer:   [facility] Specific Question:   Radiology Contrast Protocol - do NOT remove  file path    Answer:   [email or web address].[email or web address]  . CBC    No orders of the defined types were placed in this encounter.    Andre Rumps, MD [place]

## 2017-08-21 NOTE — Assessment & Plan Note (Signed)
Patient with some allergy symptoms.  Encourage Flonase use.

## 2017-08-21 NOTE — Assessment & Plan Note (Signed)
He will need to have follow-up in July with GI.  He is aware of this.

## 2017-08-21 NOTE — Assessment & Plan Note (Signed)
This is resolved.  Benign abdominal exam.  Suspect related to his antibiotic use.  If it recurs he will let us know.

## 2017-08-21 NOTE — Telephone Encounter (Signed)
See result note. Sent to [facility].

## 2017-08-21 NOTE — Telephone Encounter (Signed)
CRITICAL VALUE STICKER  CRITICAL VALUE: WBC-18.4  RECEIVER (on-site recipient of call): Jari Favre, CMA  DATE & TIME NOTIFIED: 08/21/17 @ 11:54am  MESSENGER (representative from lab): Manuela Schwartz  MD NOTIFIED: Dr. [name]  TIME OF NOTIFICATION: 11:56 am  RESPONSE: see phone or lab note

## 2017-08-22 ENCOUNTER — Telehealth: Payer: Self-pay | Admitting: Radiology

## 2017-08-22 ENCOUNTER — Other Ambulatory Visit (INDEPENDENT_AMBULATORY_CARE_PROVIDER_SITE_OTHER): Payer: PPO

## 2017-08-22 ENCOUNTER — Encounter: Payer: Self-pay | Admitting: Family Medicine

## 2017-08-22 ENCOUNTER — Other Ambulatory Visit: Payer: Self-pay

## 2017-08-22 ENCOUNTER — Ambulatory Visit (INDEPENDENT_AMBULATORY_CARE_PROVIDER_SITE_OTHER): Payer: PPO | Admitting: Family Medicine

## 2017-08-22 DIAGNOSIS — R42 Dizziness and giddiness: Secondary | ICD-10-CM | POA: Diagnosis not present

## 2017-08-22 DIAGNOSIS — D72829 Elevated white blood cell count, unspecified: Secondary | ICD-10-CM

## 2017-08-22 LAB — CBC WITH DIFFERENTIAL/PLATELET
Basophils Absolute: 0 10*3/uL (ref 0.0–0.1)
Basophils Relative: 0.2 % (ref 0.0–3.0)
Eosinophils Absolute: 0.1 10*3/uL (ref 0.0–0.7)
Eosinophils Relative: 0.4 % (ref 0.0–5.0)
HCT: 46.8 % (ref 39.0–52.0)
Hemoglobin: 15.4 g/dL (ref 13.0–17.0)
Lymphocytes Relative: 12.9 % (ref 12.0–46.0)
Lymphs Abs: 2.5 10*3/uL (ref 0.7–4.0)
MCHC: 32.8 g/dL (ref 30.0–36.0)
MCV: 93 fl (ref 78.0–100.0)
Monocytes Absolute: 2.2 10*3/uL — ABNORMAL HIGH (ref 0.1–1.0)
Monocytes Relative: 11.5 % (ref 3.0–12.0)
Neutro Abs: 14.4 10*3/uL — ABNORMAL HIGH (ref 1.4–7.7)
Neutrophils Relative %: 75 % (ref 43.0–77.0)
Platelets: 189 10*3/uL (ref 150.0–400.0)
RBC: 5.04 Mil/uL (ref 4.22–5.81)
RDW: 14.1 % (ref 11.5–15.5)
WBC: 19.3 10*3/uL (ref 4.0–10.5)

## 2017-08-22 NOTE — Patient Instructions (Signed)
Nice to see you. We will touch base with your neurologist to get their thoughts on your symptoms. Please monitor and if your symptoms worsen please be evaluated.

## 2017-08-22 NOTE — Assessment & Plan Note (Signed)
Chronic issue.  Potentially worsened recently with his upper respiratory symptoms following his pneumonia.  Potentially could be related to his Zoloft given that that may have been started around the time his symptoms worsen previously.  We will have him discontinue his Zoloft as he does not feel depressed.  If his symptoms do not improve with this we will have him follow-up with neurology to see if his Parkinson's could be playing a role.  He is given return precautions.

## 2017-08-22 NOTE — Telephone Encounter (Signed)
Please let the patient know that his white blood cell count is again elevated to a similar level.  This may be related to his recent steroid use.  I do not believe this is related to infection after evaluating him today and yesterday.  If he develops fevers or starts to feel ill he should be reevaluated.  I would suggest we recheck this early next week.  Orders placed.  Thanks.

## 2017-08-22 NOTE — Addendum Note (Signed)
Addended by: [name] on: 08/22/2017 08:33 PM   Modules accepted: Orders

## 2017-08-22 NOTE — Progress Notes (Signed)
  [name], MD Phone: [phone]  Andre [name] is a 82 y.o. male who presents today for same-day visit.  Patient presents for evaluation of lightheadedness.  He notes he would not be here except that his wife wanted him seen.  He felt he got a little worse after being stuck for blood draw today. He notes chronic lightheadedness when he goes from seated to standing that has been going on for some time now.  Notes his current symptoms are similar to that.  He has had no syncope or recent falls.  He did fall previously and had evaluation at and outside hospital with imaging of his head.  He notes recent head congestion with his pneumonia though that has improved significantly and he is hearing much better.  He feels a little off balance when he turns too quickly.  He does follow with neurology for his Parkinson's.  They cut back on his Sinemet though he has gone back up on the dose to some degree.  He has also been on Zoloft and his wife thinks the symptoms may have worsened around the time he started that.  Social History   Tobacco Use  Smoking Status Former Smoker  . Packs/day: 1.00  . Years: 40.00  . Pack years: 40.00  . Types: Cigarettes, Pipe, Cigars  . Last attempt to quit: 04/18/1972  . Years since quitting: 45.3  Smokeless Tobacco Former Systems developer  . Types: Chew  . Quit date: 04/18/1972     ROS see history of present illness  Objective  Physical Exam Vitals:   08/22/17 1442 08/22/17 1443  SpO2: 96% 96%   Laying blood pressure 124/78 pulse 59 Sitting blood pressure 124/74 pulse 58 Standing blood pressure 122/78 pulse 60  BP Readings from Last 3 Encounters:  08/21/17 130/70  08/12/17 (!) 155/83  07/24/17 (!) 189/88   Wt Readings from Last 3 Encounters:  08/21/17 253 lb 9.6 oz (115 kg)  08/12/17 258 lb 8 oz (117.3 kg)  07/24/17 271 lb 8 oz (123.2 kg)    Physical Exam  Constitutional: No distress.  Cardiovascular: Normal rate, regular rhythm and normal heart sounds.   Pulmonary/Chest: Effort normal and breath sounds normal.  Musculoskeletal: He exhibits no edema.  Neurological: He is alert.  CN 2-12 intact, 5/5 strength in bilateral biceps, triceps, grip, quads, hamstrings, plantar and dorsiflexion, sensation to light touch intact in bilateral UE and LE, normal gait though slightly off balance when he turns while walking  Skin: Skin is warm and dry. He is not diaphoretic.     Assessment/Plan: Please see individual problem list.  Lightheadedness Chronic issue.  Potentially worsened recently with his upper respiratory symptoms following his pneumonia.  Potentially could be related to his Zoloft given that that may have been started around the time his symptoms worsen previously.  We will have him discontinue his Zoloft as he does not feel depressed.  If his symptoms do not improve with this we will have him follow-up with neurology to see if his Parkinson's could be playing a role.  He is given return precautions. Orthostatics negative.   No orders of the defined types were placed in this encounter.   No orders of the defined types were placed in this encounter.    [name], MD [place]

## 2017-08-22 NOTE — Telephone Encounter (Signed)
Andre [name] called from [place] with critical lab results of WBC of 19.3

## 2017-08-23 ENCOUNTER — Other Ambulatory Visit: Payer: PPO

## 2017-08-23 NOTE — Telephone Encounter (Signed)
Patient notified and scheduled 

## 2017-08-24 ENCOUNTER — Encounter: Payer: Self-pay | Admitting: Internal Medicine

## 2017-08-24 ENCOUNTER — Ambulatory Visit: Payer: PPO | Admitting: Cardiovascular Disease

## 2017-08-24 ENCOUNTER — Ambulatory Visit: Payer: PPO | Admitting: Internal Medicine

## 2017-08-24 VITALS — BP 126/80 | HR 63 | Ht 66.0 in | Wt 255.0 lb

## 2017-08-24 DIAGNOSIS — J449 Chronic obstructive pulmonary disease, unspecified: Secondary | ICD-10-CM | POA: Diagnosis not present

## 2017-08-24 DIAGNOSIS — J849 Interstitial pulmonary disease, unspecified: Secondary | ICD-10-CM | POA: Diagnosis not present

## 2017-08-24 DIAGNOSIS — J441 Chronic obstructive pulmonary disease with (acute) exacerbation: Secondary | ICD-10-CM | POA: Diagnosis not present

## 2017-08-24 DIAGNOSIS — G4733 Obstructive sleep apnea (adult) (pediatric): Secondary | ICD-10-CM | POA: Diagnosis not present

## 2017-08-24 DIAGNOSIS — J45909 Unspecified asthma, uncomplicated: Secondary | ICD-10-CM | POA: Diagnosis not present

## 2017-08-24 MED ORDER — ALBUTEROL SULFATE (2.5 MG/3ML) 0.083% IN NEBU: 2.5000 mg | INHALATION_SOLUTION | RESPIRATORY_TRACT | 12 refills | Status: DC | PRN

## 2017-08-24 NOTE — Patient Instructions (Addendum)
Continue CPAP as tolerated Continue DOUNEBS as needed Patient needs NEB machine

## 2017-08-24 NOTE — Progress Notes (Signed)
  Andre [name]      MRN# [id number] [name] 06/17/1935   CC: Chief Complaint  Patient presents with  . Hospitalization Follow-up    denies SOB: cough due to drainage:   follow up SOB    Brief History: 82 yo M former smoker, history of CAD/CABG/hypertension/allergies on immunotherapy/OSA on CPAP/prior PE, initially seen in [name] for dyspnea, PFT with mixed restriction and obstruction, currently following with pulmonary for OSA optimization.   PREVIOUS OV He was diagnosed with OSA in 2010, AHI= 52. Compliance report shows AHI 0.7, 100% compliance CPAP 12 cm h20  S/p cardiac stents placed   Quit tobacco abuse 40 years ago 6MWT was WNL at last visit   He is accompanied today by his wife.  No signs of infection at this time   Patient with recent admission for pneumonia Was given IV abx and steroids and feels much better since admission Chronic SOB AND DOE STABLE at this time Using and benefiting from CPAP therapy as prescibed    Review of Systems  Constitutional: Negative for malaise/fatigue.  Eyes: Negative for blurred vision.  Respiratory: Positive for shortness of breath. Negative for cough, hemoptysis, sputum production and wheezing.   Cardiovascular: Negative for chest pain.  Genitourinary: Negative for dysuria.  Musculoskeletal: Negative for myalgias.  Neurological: Negative for dizziness, weakness and headaches.  Endo/Heme/Allergies: Does not bruise/bleed easily.  Psychiatric/Behavioral: Negative for depression.      Allergies:  Pravastatin and Prednisone  Physical Examination:  BP 126/80 (BP Location: Left Arm, Cuff Size: Normal)   Pulse 63   Ht 5\' 6"  (1.676 m)   Wt 255 lb (115.7 kg)   SpO2 92%   BMI 41.16 kg/m  BP 126/80 (BP Location: Left Arm, Cuff Size: Normal)   Pulse 63   Ht 5\' 6"  (1.676 m)   Wt 255 lb (115.7 kg)   SpO2 92%   BMI 41.16 kg/m   General Appearance: no resp distress HEENT:  PERRLA, no ptosis, no other lesions noticed Pulmonary:normal breath sounds., diaphragmatic excursion normal.No wheezing, +rales/crackles Cardiovascular:  Normal S1,S2.  No m/r/g.     Abdomen:Exam: Benign, Soft, non-tender, No masses  Skin:   warm, no rashes, no ecchymosis  Extremities: +edema    Assessment and Plan:  82 year old male past medical history of obesity, obstructive sleep apnea, seen in follow-up visit for OSA optimization.  Now with chronic SOB likely from CHF and his morbid obesity and deconditioned state  SOB-ILD on CT chest  PFT shows moderate restrictive lung disease -follow up cardiology Will prescribe DOUNEBs every 4 hrs as needed CT chest 2017 shows early ILD-need to assess with repeat CT chest Patient also on amiodarone over last 2 years-case dicussed with Dr Fletcher Anon about re-assessing amiodarone use   OSA (obstructive sleep apnea) Patient with prior sleep study with diagnosis of severe sleep apnea. AHI=1.2 on 12cm H2O of CPAP, with 83% compliance was in hospital for several days Plan: -CPAP 12cm H2O with full face mask  Obesity -recommend significant weight loss -recommend changing diet  Deconditioned state -Recommend increased daily activity and exercise as tolerated  Patient/Family are satisfied with Plan of action and management. All questions answered Follow up in 3 months  Andre [name] Andre [name], M.D.  [name]  Medical Director [place] Director [facility] Cardio-Pulmonary Department

## 2017-08-24 NOTE — Addendum Note (Signed)
Addended by: Oscar La R on: 08/24/2017 10:22 AM   Modules accepted: Orders

## 2017-08-25 ENCOUNTER — Other Ambulatory Visit: Payer: Self-pay | Admitting: Neurology

## 2017-08-26 ENCOUNTER — Inpatient Hospital Stay
Admission: EM | Admit: 2017-08-26 | Discharge: 2017-08-28 | DRG: 291 | Disposition: A | Discharge status: Home / Self Care | Payer: PPO | Attending: Internal Medicine | Admitting: Internal Medicine

## 2017-08-26 ENCOUNTER — Other Ambulatory Visit: Payer: Self-pay

## 2017-08-26 ENCOUNTER — Encounter: Payer: Self-pay | Admitting: Emergency Medicine

## 2017-08-26 ENCOUNTER — Emergency Department: Payer: PPO

## 2017-08-26 DIAGNOSIS — Z86711 Personal history of pulmonary embolism: Secondary | ICD-10-CM

## 2017-08-26 DIAGNOSIS — E785 Hyperlipidemia, unspecified: Secondary | ICD-10-CM | POA: Diagnosis present

## 2017-08-26 DIAGNOSIS — Z87891 Personal history of nicotine dependence: Secondary | ICD-10-CM

## 2017-08-26 DIAGNOSIS — Z7901 Long term (current) use of anticoagulants: Secondary | ICD-10-CM

## 2017-08-26 DIAGNOSIS — I480 Paroxysmal atrial fibrillation: Secondary | ICD-10-CM | POA: Diagnosis present

## 2017-08-26 DIAGNOSIS — I2510 Atherosclerotic heart disease of native coronary artery without angina pectoris: Secondary | ICD-10-CM | POA: Diagnosis not present

## 2017-08-26 DIAGNOSIS — Z79890 Hormone replacement therapy (postmenopausal): Secondary | ICD-10-CM

## 2017-08-26 DIAGNOSIS — I252 Old myocardial infarction: Secondary | ICD-10-CM | POA: Diagnosis not present

## 2017-08-26 DIAGNOSIS — G4733 Obstructive sleep apnea (adult) (pediatric): Secondary | ICD-10-CM | POA: Diagnosis not present

## 2017-08-26 DIAGNOSIS — Z9049 Acquired absence of other specified parts of digestive tract: Secondary | ICD-10-CM

## 2017-08-26 DIAGNOSIS — Z6841 Body Mass Index (BMI) 40.0 and over, adult: Secondary | ICD-10-CM | POA: Diagnosis not present

## 2017-08-26 DIAGNOSIS — I5033 Acute on chronic diastolic (congestive) heart failure: Secondary | ICD-10-CM | POA: Diagnosis not present

## 2017-08-26 DIAGNOSIS — E039 Hypothyroidism, unspecified: Secondary | ICD-10-CM | POA: Diagnosis not present

## 2017-08-26 DIAGNOSIS — J9811 Atelectasis: Secondary | ICD-10-CM | POA: Diagnosis not present

## 2017-08-26 DIAGNOSIS — J9601 Acute respiratory failure with hypoxia: Secondary | ICD-10-CM | POA: Diagnosis present

## 2017-08-26 DIAGNOSIS — J189 Pneumonia, unspecified organism: Secondary | ICD-10-CM | POA: Diagnosis not present

## 2017-08-26 DIAGNOSIS — R0600 Dyspnea, unspecified: Secondary | ICD-10-CM | POA: Diagnosis not present

## 2017-08-26 DIAGNOSIS — J440 Chronic obstructive pulmonary disease with acute lower respiratory infection: Secondary | ICD-10-CM | POA: Diagnosis not present

## 2017-08-26 DIAGNOSIS — J8410 Pulmonary fibrosis, unspecified: Secondary | ICD-10-CM | POA: Diagnosis not present

## 2017-08-26 DIAGNOSIS — Z7902 Long term (current) use of antithrombotics/antiplatelets: Secondary | ICD-10-CM | POA: Diagnosis not present

## 2017-08-26 DIAGNOSIS — R0602 Shortness of breath: Secondary | ICD-10-CM

## 2017-08-26 DIAGNOSIS — J849 Interstitial pulmonary disease, unspecified: Secondary | ICD-10-CM | POA: Diagnosis not present

## 2017-08-26 DIAGNOSIS — I110 Hypertensive heart disease with heart failure: Secondary | ICD-10-CM | POA: Diagnosis not present

## 2017-08-26 DIAGNOSIS — Z7951 Long term (current) use of inhaled steroids: Secondary | ICD-10-CM

## 2017-08-26 DIAGNOSIS — E876 Hypokalemia: Secondary | ICD-10-CM | POA: Diagnosis not present

## 2017-08-26 DIAGNOSIS — G20 Parkinson's disease: Secondary | ICD-10-CM | POA: Diagnosis present

## 2017-08-26 DIAGNOSIS — Z951 Presence of aortocoronary bypass graft: Secondary | ICD-10-CM

## 2017-08-26 DIAGNOSIS — Z888 Allergy status to other drugs, medicaments and biological substances status: Secondary | ICD-10-CM

## 2017-08-26 DIAGNOSIS — R0789 Other chest pain: Secondary | ICD-10-CM | POA: Diagnosis not present

## 2017-08-26 DIAGNOSIS — J181 Lobar pneumonia, unspecified organism: Secondary | ICD-10-CM | POA: Diagnosis not present

## 2017-08-26 HISTORY — DX: Pneumonia, unspecified organism: J18.9

## 2017-08-26 LAB — COMPREHENSIVE METABOLIC PANEL
ALT: 13 U/L — ABNORMAL LOW (ref 17–63)
AST: 14 U/L — ABNORMAL LOW (ref 15–41)
Albumin: 2.8 g/dL — ABNORMAL LOW (ref 3.5–5.0)
Alkaline Phosphatase: 78 U/L (ref 38–126)
Anion gap: 7 (ref 5–15)
BUN: 11 mg/dL (ref 6–20)
CO2: 26 mmol/L (ref 22–32)
Calcium: 7.9 mg/dL — ABNORMAL LOW (ref 8.9–10.3)
Chloride: 102 mmol/L (ref 101–111)
Creatinine, Ser: 0.89 mg/dL (ref 0.61–1.24)
GFR calc Af Amer: >60 mL/min (ref >60)
GFR calc non Af Amer: >60 mL/min (ref >60)
Glucose, Bld: 188 mg/dL — ABNORMAL HIGH (ref 65–99)
Potassium: 3.7 mmol/L (ref 3.5–5.1)
Sodium: 135 mmol/L (ref 135–145)
Total Bilirubin: 0.8 mg/dL (ref 0.3–1.2)
Total Protein: 6.3 g/dL — ABNORMAL LOW (ref 6.5–8.1)

## 2017-08-26 LAB — CBC
HCT: 43.1 % (ref 40.0–52.0)
Hemoglobin: 14.2 g/dL (ref 13.0–18.0)
MCH: 30.4 pg (ref 26.0–34.0)
MCHC: 32.9 g/dL (ref 32.0–36.0)
MCV: 92.5 fL (ref 80.0–100.0)
Platelets: 166 10*3/uL (ref 150–440)
RBC: 4.66 MIL/uL (ref 4.40–5.90)
RDW: 14.5 % (ref 11.5–14.5)
WBC: 14.8 10*3/uL — ABNORMAL HIGH (ref 3.8–10.6)

## 2017-08-26 LAB — TROPONIN I: Troponin I: <0.03 ng/mL (ref <0.03)

## 2017-08-26 LAB — LACTIC ACID, PLASMA: Lactic Acid, Venous: 1.2 mmol/L (ref 0.5–1.9)

## 2017-08-26 LAB — MRSA PCR SCREENING: MRSA by PCR: NEGATIVE

## 2017-08-26 LAB — BRAIN NATRIURETIC PEPTIDE: B Natriuretic Peptide: 293 pg/mL — ABNORMAL HIGH (ref 0.0–100.0)

## 2017-08-26 LAB — PROCALCITONIN: Procalcitonin: <0.1 ng/mL

## 2017-08-26 MED ORDER — CYCLOBENZAPRINE HCL 10 MG PO TABS
5.0000 mg | ORAL_TABLET | Freq: Three times a day (TID) | ORAL | Status: DC | PRN
  Administered 2017-08-28: 5 mg via ORAL
  Filled 2017-08-26: qty 1

## 2017-08-26 MED ORDER — FUROSEMIDE 20 MG PO TABS
20.0000 mg | ORAL_TABLET | Freq: Every day | ORAL | Status: DC
  Administered 2017-08-26: 20 mg via ORAL
  Filled 2017-08-26: qty 1

## 2017-08-26 MED ORDER — LOSARTAN POTASSIUM 50 MG PO TABS
100.0000 mg | ORAL_TABLET | Freq: Every day | ORAL | Status: DC
  Administered 2017-08-27 – 2017-08-28 (×2): 100 mg via ORAL
  Filled 2017-08-26 (×2): qty 2

## 2017-08-26 MED ORDER — MIRABEGRON ER 50 MG PO TB24
50.0000 mg | ORAL_TABLET | Freq: Every day | ORAL | Status: DC
  Administered 2017-08-26 – 2017-08-28 (×3): 50 mg via ORAL
  Filled 2017-08-26 (×3): qty 1

## 2017-08-26 MED ORDER — POLYETHYLENE GLYCOL 3350 17 G PO PACK: 17.0000 g | PACK | Freq: Every day | ORAL | Status: DC | PRN

## 2017-08-26 MED ORDER — VANCOMYCIN HCL 10 G IV SOLR
1250.0000 mg | Freq: Two times a day (BID) | INTRAVENOUS | Status: DC
  Administered 2017-08-26 – 2017-08-27 (×2): 1250 mg via INTRAVENOUS
  Filled 2017-08-26 (×3): qty 1250

## 2017-08-26 MED ORDER — SODIUM CHLORIDE 0.9 % IV SOLN
INTRAVENOUS | Status: DC
  Administered 2017-08-26: 12:00:00 via INTRAVENOUS

## 2017-08-26 MED ORDER — SODIUM CHLORIDE 0.9 % IV SOLN
2.0000 g | Freq: Three times a day (TID) | INTRAVENOUS | Status: DC
  Administered 2017-08-26 – 2017-08-27 (×3): 2 g via INTRAVENOUS
  Filled 2017-08-26 (×5): qty 2

## 2017-08-26 MED ORDER — ACETAMINOPHEN 325 MG PO TABS: 650.0000 mg | ORAL_TABLET | Freq: Four times a day (QID) | ORAL | Status: DC | PRN

## 2017-08-26 MED ORDER — APIXABAN 5 MG PO TABS
5.0000 mg | ORAL_TABLET | Freq: Two times a day (BID) | ORAL | Status: DC
  Administered 2017-08-26 – 2017-08-28 (×5): 5 mg via ORAL
  Filled 2017-08-26 (×5): qty 1

## 2017-08-26 MED ORDER — ONDANSETRON HCL 4 MG/2ML IJ SOLN
4.0000 mg | Freq: Four times a day (QID) | INTRAMUSCULAR | Status: DC | PRN
Start: 2017-08-26 — End: 2017-08-28

## 2017-08-26 MED ORDER — CLOPIDOGREL BISULFATE 75 MG PO TABS
75.0000 mg | ORAL_TABLET | Freq: Every day | ORAL | Status: DC
  Administered 2017-08-26 – 2017-08-28 (×3): 75 mg via ORAL
  Filled 2017-08-26 (×3): qty 1

## 2017-08-26 MED ORDER — ISOSORBIDE MONONITRATE ER 30 MG PO TB24
60.0000 mg | ORAL_TABLET | Freq: Every day | ORAL | Status: DC
  Administered 2017-08-27 – 2017-08-28 (×2): 60 mg via ORAL
  Filled 2017-08-26 (×2): qty 2

## 2017-08-26 MED ORDER — SODIUM CHLORIDE 0.9 % IV SOLN
2.0000 g | Freq: Once | INTRAVENOUS | Status: AC
  Administered 2017-08-26: 2 g via INTRAVENOUS
  Filled 2017-08-26: qty 2

## 2017-08-26 MED ORDER — IPRATROPIUM-ALBUTEROL 0.5-2.5 (3) MG/3ML IN SOLN
3.0000 mL | Freq: Four times a day (QID) | RESPIRATORY_TRACT | Status: DC
  Administered 2017-08-26 (×2): 3 mL via RESPIRATORY_TRACT
  Filled 2017-08-26: qty 3

## 2017-08-26 MED ORDER — ONDANSETRON HCL 4 MG PO TABS: 4.0000 mg | ORAL_TABLET | Freq: Four times a day (QID) | ORAL | Status: DC | PRN

## 2017-08-26 MED ORDER — ROSUVASTATIN CALCIUM 10 MG PO TABS
10.0000 mg | ORAL_TABLET | Freq: Every day | ORAL | Status: DC
  Administered 2017-08-26 – 2017-08-27 (×2): 10 mg via ORAL
  Filled 2017-08-26 (×2): qty 1

## 2017-08-26 MED ORDER — LEVOTHYROXINE SODIUM 50 MCG PO TABS
50.0000 ug | ORAL_TABLET | Freq: Every day | ORAL | Status: DC
  Administered 2017-08-26 – 2017-08-28 (×2): 50 ug via ORAL
  Filled 2017-08-26 (×3): qty 1

## 2017-08-26 MED ORDER — CARVEDILOL 3.125 MG PO TABS
6.2500 mg | ORAL_TABLET | Freq: Two times a day (BID) | ORAL | Status: DC
  Administered 2017-08-26 – 2017-08-28 (×5): 6.25 mg via ORAL
  Filled 2017-08-26 (×5): qty 2

## 2017-08-26 MED ORDER — ACETAMINOPHEN 650 MG RE SUPP: 650.0000 mg | Freq: Four times a day (QID) | RECTAL | Status: DC | PRN

## 2017-08-26 MED ORDER — HYDROCODONE-ACETAMINOPHEN 5-325 MG PO TABS
1.0000 | ORAL_TABLET | Freq: Every day | ORAL | Status: DC | PRN
  Administered 2017-08-28: 1 via ORAL
  Filled 2017-08-26: qty 1

## 2017-08-26 MED ORDER — VANCOMYCIN HCL IN DEXTROSE 1-5 GM/200ML-% IV SOLN
1000.0000 mg | Freq: Once | INTRAVENOUS | Status: AC
  Administered 2017-08-26: 1000 mg via INTRAVENOUS
  Filled 2017-08-26: qty 200

## 2017-08-26 MED ORDER — CARBIDOPA-LEVODOPA 25-250 MG PO TABS
2.0000 | ORAL_TABLET | Freq: Three times a day (TID) | ORAL | Status: DC
  Administered 2017-08-26 – 2017-08-28 (×7): 2 via ORAL
  Filled 2017-08-26 (×9): qty 2

## 2017-08-26 NOTE — Progress Notes (Addendum)
Pharmacy Antibiotic Note  Andre [name] is a 82 y.o. male admitted on 08/26/2017 with pneumonia(HCAP).  Pharmacy has been consulted for vancomycin and cefepime dosing.  Plan: Cefepime 2g IV q8h  Vancomycin 1250mg  IV q12h with a 6 hour stack dose.  Estimated Cmin 15.6 Goal vanc trough 15-20 Will check first vanc trough before fourth dose  T1/2 9.8, Vd 59.2, Ke 0.071   Height: 5\' 6"  (167.6 cm) Weight: 255 lb (115.7 kg) IBW/kg (Calculated) : 63.8  Temp (24hrs), Avg:98.4 F (36.9 C), Min:98.4 F (36.9 C), Max:98.4 F (36.9 C)  Recent Labs  Lab 08/21/17 0853 08/22/17 1401 08/26/17 0721 08/26/17 0908  WBC 18.4 Repeated and verified X2.* 19.3 Repeated and verified X2.* 14.8*  --   CREATININE  --   --  0.89  --   LATICACIDVEN  --   --   --  1.2    Estimated Creatinine Clearance: 80.5 mL/min (by C-G formula based on SCr of 0.89 mg/dL).    Allergies  Allergen Reactions  . Pravastatin Other (See Comments)  . Prednisone Other (See Comments)    Pt states that med makes him hyper Pt states that med makes him hyper    Antimicrobials this admission: Cefepime 5/11 >>  Vancomycin 5/11 >>   Dose adjustments this admission:   Microbiology results: 5/11 Procalcitonin >> ordered 5/11 MRSA PCR >> ordered  Thank you for allowing pharmacy to be a part of this patient's care.  Candelaria Stagers, PharmD Pharmacy Resident  08/26/2017 10:45 AM

## 2017-08-26 NOTE — H&P (Signed)
[place] [facility] [place] NAME: Andre [name]    MR#:  [id number]  DATE OF BIRTH:  04-08-1936  DATE OF ADMISSION:  08/26/2017  PRIMARY CARE PHYSICIAN: [name], MD   REQUESTING/REFERRING PHYSICIAN: Dr. Corky Downs  CHIEF COMPLAINT:  Shortness of breath HISTORY OF PRESENT ILLNESS:  Andre [name]  is a 82 y.o. male with a known history of interstitial lung disease chronic diastolic heart failure, OSA on CPAP and CAD presents to the emergency room complaining of shortness of breath.  Patient reports over the past 2 days he has had increasing shortness of breath and dyspnea exertion.  He is also had pain on the right side of his rib.  He denies any trauma.  He denies lower extremity edema, chest pain, PND orthopnea.  Patient reports that he was recently treated for pneumonia.  He was in our hospital and discharged on the 27th with acute hypoxic respiratory failure due to community acquired pneumonia.  He was discharged on Augmentin and doxycycline.  PAST MEDICAL HISTORY:   Past Medical History:  Diagnosis Date  . Cervical spondylosis 10/01/2013  . Chronic diastolic CHF (congestive heart failure) ([place])    a. 07/2016 Echo: >55%; b. 10/2016 Echo: EF 55-60%, Gr1 DD, Ao sclerosis w/o stenosis, sev dil LA.  [name] Coronary artery disease    a. 1998 s/p mini-cabg @ [facility] - LIMA->LAD;  b. 07/2016 St Echo:  Inadequate HR (max 97) w/ hypertensive response (220/96). Ex time only 2:54 - stopped due to dyspnea and leg pain;  c.  08/2016 MV: EF 67%, no ischemia, low risk; d. 10/2016 NSTEMI/Cath: LM 40, LAD 100ost, RI 80, LCX nl, RCA 95p (4.0x26 Onyx DES), 1m, LIMA->LAD nl, EF 50-55%.  . Depression   . GERD (gastroesophageal reflux disease)   . Hyperlipidemia   . Hypertension   . PAF (paroxysmal atrial fibrillation) ([facility])    a. s/p DCCV-->maintaining sinus on amiodarone;  b. CHA2DS2VASc = 5-->eliquis.  . Parkinson's disease ([place])    tremors  . Pulmonary embolism ([place]) 2011   . 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 Sleep apnea    wears CPAP    PAST SURGICAL HISTORY:   Past Surgical History:  Procedure Laterality Date  . BACK SURGERY  1960  . CARDIAC CATHETERIZATION    . CHOLECYSTECTOMY  2010  . CORONARY ARTERY BYPASS GRAFT  01/07/1997  . CORONARY STENT INTERVENTION N/A 10/31/2016   Procedure: Coronary Stent Intervention;  Surgeon: Wellington Hampshire, MD;  Location: [facility];  Service: Cardiovascular;  Laterality: N/A;  . ELECTROPHYSIOLOGIC STUDY N/A 07/14/2015   Procedure: CARDIOVERSION;  Surgeon: Yolonda Kida, MD;  Location: [facility] OR[facility];  Service: Cardiovascular;  Laterality: N/A;  . ELECTROPHYSIOLOGIC STUDY N/A 10/12/2015   Procedure: CARDIOVERSION;  Surgeon: Minna Merritts, MD;  Location: [facility] ORS;  Service: Cardiovascular;  Laterality: N/A;  . LEFT HEART CATH AND CORONARY ANGIOGRAPHY N/A 10/31/2016   Procedure: Left Heart Cath and Coronary Angiography;  Surgeon: Wellington Hampshire, MD;  Location: [facility];  Service: Cardiovascular;  Laterality: N/A;  . OTHER SURGICAL HISTORY  1998   Bypass    SOCIAL HISTORY:   Social History   Tobacco Use  . Smoking status: Former Smoker    Packs/day: 1.00    Years: 40.00    Pack years: 40.00    Types: Cigarettes, Pipe, Cigars    Last attempt to quit: 04/18/1972    Years since quitting: 45.3  . Smokeless tobacco:  Former User    Types: Chew    Quit date: 04/18/1972  Substance Use Topics  . Alcohol use: Yes    Alcohol/week: 2.4 oz    Types: 2 Cans of beer, 2 Shots of liquor per week    Comment: per 2 weeks     FAMILY HISTORY:   EtOH abuse  DRUG ALLERGIES:   Allergies  Allergen Reactions  . Pravastatin Other (See Comments)  . Prednisone Other (See Comments)    Pt states that med makes him hyper Pt states that med makes him hyper    REVIEW OF SYSTEMS:   Review of Systems  Constitutional: Positive for diaphoresis. Negative for chills, fever, malaise/fatigue and weight  loss.  HENT: Negative.  Negative for ear discharge, ear pain, hearing loss, nosebleeds and sore throat.   Eyes: Negative.  Negative for blurred vision and pain.  Respiratory: Positive for cough and shortness of breath. Negative for hemoptysis, sputum production and wheezing.   Cardiovascular: Negative.  Negative for chest pain, palpitations and leg swelling.  Gastrointestinal: Negative.  Negative for abdominal pain, blood in stool, diarrhea, nausea and vomiting.  Genitourinary: Negative.  Negative for dysuria.  Musculoskeletal: Negative.  Negative for back pain.  Skin: Negative.   Neurological: Negative for dizziness, tremors, speech change, focal weakness, seizures and headaches.  Endo/Heme/Allergies: Negative.  Does not bruise/bleed easily.  Psychiatric/Behavioral: Negative.  Negative for depression, hallucinations and suicidal ideas.    MEDICATIONS AT HOME:   Prior to Admission medications   Medication Sig Start Date End Date Taking? Authorizing Provider  acetaminophen (TYLENOL) 500 MG tablet Take 500 mg every 6 (six) hours as needed by mouth for mild pain or moderate pain.    Yes [provider]  carbidopa-levodopa (SINEMET) 25-250 MG tablet Take 2 tablets by mouth 3 (three) times daily. 02/01/17  Yes Kathrynn Ducking, MD  carvedilol (COREG) 6.25 MG tablet TAKE ONE TABLET BY MOUTH TWICE DAILY WITH A MEAL 07/24/17  Yes Wellington Hampshire, MD  clopidogrel (PLAVIX) 75 MG tablet TAKE ONE TABLET BY MOUTH EVERY DAY WITH BREAKFAST 07/06/17  Yes Wellington Hampshire, MD  cyclobenzaprine (FLEXERIL) 5 MG tablet Take 5 mg by mouth 3 (three) times daily as needed for muscle spasms.   Yes [provider]  ELIQUIS 5 MG TABS tablet TAKE ONE TABLET TWICE DAILY 02/06/17  Yes [name] A, MD  EPINEPHrine 0.3 mg/0.3 mL IJ SOAJ injection Inject 0.3 mg as directed once as needed (allergic reaction). Reported on 10/27/2015 07/01/13  Yes [provider]  esomeprazole (NEXIUM) 20 MG  capsule Take 40 mg by mouth daily at 12 noon.   Yes [provider]  fluticasone (FLONASE) 50 MCG/ACT nasal spray TAKE 2 PUFFS IN EACH NOSTRIL EVERY DAY 10/18/16  Yes Cook, Jayce G, DO  furosemide (LASIX) 20 MG tablet Take 20 mg by mouth daily.   Yes [provider]  HYDROcodone-acetaminophen (NORCO/VICODIN) 5-325 MG tablet Take 1 tablet by mouth daily as needed (neck pain).  06/16/17  Yes [provider]  isosorbide mononitrate (IMDUR) 60 MG 24 hr tablet Take 1 tablet (60 mg total) by mouth daily. 02/14/17  Yes Wellington Hampshire, MD  levothyroxine (SYNTHROID, LEVOTHROID) 50 MCG tablet Take 50 mcg by mouth daily before lunch.  09/07/16  Yes [provider]  losartan (COZAAR) 100 MG tablet Take 1 tablet (100 mg total) by mouth daily. 02/14/17  Yes Wellington Hampshire, MD  mirabegron ER (MYRBETRIQ) 50 MG TB24 tablet Take 50 mg  by mouth daily.   Yes [provider]  rosuvastatin (CRESTOR) 10 MG tablet Take 10 mg by mouth at bedtime.   Yes [provider]  VENTOLIN HFA 108 (90 Base) MCG/ACT inhaler TAKE 2 PUFFS EVERY 8 HOURS AS NEEDED FORWHEEZING 12/02/16  Yes Cook, Jayce G, DO  albuterol (PROVENTIL) (2.5 MG/3ML) 0.083% nebulizer solution Take 3 mLs (2.5 mg total) by nebulization every 4 (four) hours as needed for wheezing or shortness of breath. 08/24/17   Flora Lipps, MD  entacapone (COMTAN) 200 MG tablet TAKE ONE TABLET BY MOUTH 3 TIMES DAILY Patient not taking: Reported on 08/26/2017 08/25/17   Kathrynn Ducking, MD  sertraline (ZOLOFT) 50 MG tablet 1 tablet daily for 2 weeks, then take 2 tablets daily Patient not taking: Reported on 08/26/2017 06/13/17   Kathrynn Ducking, MD      VITAL SIGNS:  Blood pressure 126/67, pulse 69, temperature 98.4 F (36.9 C), temperature source Oral, resp. rate 17, height 5\' 6"  (1.676 m), weight 115.7 kg (255 lb), SpO2 96 %.  PHYSICAL EXAMINATION:   Physical Exam  Constitutional: He is oriented to person, place, and  time. No distress.  HENT:  Head: Normocephalic.  Eyes: No scleral icterus.  Neck: Normal range of motion. Neck supple. No JVD present. No tracheal deviation present.  Cardiovascular: Normal rate, regular rhythm and normal heart sounds. Exam reveals no gallop and no friction rub.  No murmur heard. Pulmonary/Chest: Effort normal. No respiratory distress. He has decreased breath sounds in the right lower field. He has no wheezes. He has rales in the right lower field. He exhibits no tenderness.  Abdominal: Soft. Bowel sounds are normal. He exhibits no distension and no mass. There is no tenderness. There is no rebound and no guarding.  Musculoskeletal: Normal range of motion. He exhibits no edema.  Neurological: He is alert and oriented to person, place, and time.  Skin: Skin is warm. No rash noted. No erythema.  Psychiatric: Judgment normal.      LABORATORY PANEL:   CBC Recent Labs  Lab 08/26/17 0721  WBC 14.8*  HGB 14.2  HCT 43.1  PLT 166   ------------------------------------------------------------------------------------------------------------------  Chemistries  Recent Labs  Lab 08/26/17 0721  NA 135  K 3.7  CL 102  CO2 26  GLUCOSE 188*  BUN 11  CREATININE 0.89  CALCIUM 7.9*  AST 14*  ALT 13*  ALKPHOS 78  BILITOT 0.8   ------------------------------------------------------------------------------------------------------------------  Cardiac Enzymes Recent Labs  Lab 08/26/17 0721  TROPONINI <0.03   ------------------------------------------------------------------------------------------------------------------  RADIOLOGY:  Dg Chest 2 View  Result Date: 08/26/2017 CLINICAL DATA:  Shortness of breath for 2 days EXAM: CHEST - 2 VIEW COMPARISON:  August 10, 2017 FINDINGS: The heart size and mediastinal contours are stable. Heart size is enlarged. Patchy consolidation of the right lung base with right pleural effusion is unchanged. There is probable mild  interstitial pulmonary edema bilaterally. The visualized skeletal structures are stable. IMPRESSION: Cardiomegaly.  Mild interstitial edema. Patchy consolidation of right lung base with right pleural effusion. Electronically Signed   By: Abelardo Diesel M.D.   On: 08/26/2017 08:09    EKG:  Sinus rhythm no ST elevation or depression  IMPRESSION AND PLAN:   82 year old male with a history of COPD, Parkinson's disease, OSA on CPAP, interstitial lung disease who presents with increasing shortness of breath.  1.  Acute hypoxic respiratory failure in the setting of recurrent pneumonia Patient will be treated for HCAP  2.HCAP: Continue cefepime and vancomycin  If MRSA PCR is negative then may discontinue vancomycin  3.  Interstitial lung disease: Pulmonary consultation  4.  Chronic diastolic heart failure without signs of exacerbation Continue Lasix 5.  COPD without signs of exacerbation  6.  OSA: Patient's family will bring in CPAP  7.  Morbid obesity: Recommend significant weight loss and changing diet  8.  Parkinson's disease: Continue Sinemet  9.  CAD: Continue Coreg, Plavix 10.  Hypothyroid is not: Continue Synthroid  11.  Essential hypertension: Continue Cozaar and Coreg  12.  PAF: Continue Eliquis   All the records are reviewed and case discussed with ED provider. Management plans discussed with the patient and he is in agreement  CODE STATUS: FULL  TOTAL TIME TAKING CARE OF THIS PATIENT: 44 minutes.    Emiliano Welshans M.D on 08/26/2017 at 9:43 AM  Between 7am to 6pm - Pager - [phone]  After 6pm go to [email or web address] - password [email or web address] [facility] [facility]  Office  [phone]  CC: Primary care physician; [name], MD

## 2017-08-26 NOTE — ED Notes (Signed)
AAOx3.  Skin warm and dry.  NAD 

## 2017-08-26 NOTE — ED Triage Notes (Signed)
C/O worsening SOB x 2 days.  States last night the SOB was worse.  Patient wears CPAP at night and states he felt worse with the CPAP on.  C/O back pain, right rib pain and chest pain when laying on side or prone.    Patient is Dyspneic. RA sat 94%

## 2017-08-26 NOTE — ED Notes (Signed)
Attempted to call report to floor 

## 2017-08-26 NOTE — Progress Notes (Signed)
Family Meeting Note  Advance Directive:no  Today a meeting took place with the Patient.    The following clinical team members were present during this meeting:MD  The following were discussed:Patient's diagnosis acute hypoxic respiratory failure with HCAP: , Patient's progosis: > 12 months and Goals for treatment: Full Code  Additional follow-up to be provided: chaplain consulted to start advance directives  Time spent during discussion: 16 minutes  Karel Mowers, MD

## 2017-08-26 NOTE — Consult Note (Addendum)
Reason for Consult:Pleuritic Shortness of Breath Referring Physician: Dr. Louisa [name] is an 82 y.o. male.  HPI: [name] is a very pleasant 82 year old gentleman who is known to our service being followed by Dr. Mortimer [name] in the outpatient setting for pulmonary fibrosis and obstructive sleep apnea presented to the emergency department with complaints of shortness of breath.  He states that over the past 2 days he has had increasing shortness of breath and dyspnea on exertion.  He also complains of right-sided pleuritic type chest pain and points to his right ribs.  He denies any trauma, lower extremity edema, PND or orthopnea.  His past medical history includes hypertension, hyperlipidemia, Parkinson's, paroxysmal atrial fibrillation, hypothyroidism, gastroesophageal reflux disease coronary artery disease, heart failure with preserved ejection fraction, degenerative spine disease, pulmonary emboli, obstructive sleep apnea and interstitial lung disease.Per Dr. Mortimer [name]  His pulmonary function test revealed mixed restriction and obstruction although I do not have those to evaluate.  He was recently treated for pneumonia, and was discharged on Augmentin and doxycycline, he denied any fever, chills, sweats or significant sputum production since discharge.His chest x-ray revealed patient has prior history of pulmonary emboli in takes Eliquis and is also on Plavix.  Past Medical History:  Diagnosis Date  . Cervical spondylosis 10/01/2013  . Chronic diastolic CHF (congestive heart failure) ([place])    a. 07/2016 Echo: >55%; b. 10/2016 Echo: EF 55-60%, Gr1 DD, Ao sclerosis w/o stenosis, sev dil LA.  [name] Coronary artery disease    a. 1998 s/p mini-cabg @ [facility] - LIMA->LAD;  b. 07/2016 St Echo:  Inadequate HR (max 97) w/ hypertensive response (220/96). Ex time only 2:54 - stopped due to dyspnea and leg pain;  c.  08/2016 MV: EF 67%, no ischemia, low risk; d. 10/2016 NSTEMI/Cath: LM 40, LAD 100ost, RI 80, LCX nl, RCA 95p  (4.0x26 Onyx DES), 48m LIMA->LAD nl, EF 50-55%.  . Depression   . GERD (gastroesophageal reflux disease)   . Hyperlipidemia   . Hypertension   . PAF (paroxysmal atrial fibrillation) (HCC)    a. s/p DCCV-->maintaining sinus on amiodarone;  b. CHA2DS2VASc = 5-->eliquis.  . Parkinson's disease ([facility]    tremors  . Pulmonary embolism ([facility] 2011  . 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 Sleep apnea    wears CPAP    Past Surgical History:  Procedure Laterality Date  . BACK SURGERY  1960  . CARDIAC CATHETERIZATION    . CHOLECYSTECTOMY  2010  . CORONARY ARTERY BYPASS GRAFT  01/07/1997  . CORONARY STENT INTERVENTION N/A 10/31/2016   Procedure: Coronary Stent Intervention;  Surgeon: AWellington Hampshire MD;  [facility];  Service: Cardiovascular;  Laterality: N/A;  . ELECTROPHYSIOLOGIC STUDY N/A 07/14/2015   Procedure: CARDIOVERSION;  Surgeon: DYolonda Kida MD;  Location: [facility];  Service: Cardiovascular;  Laterality: N/A;  . ELECTROPHYSIOLOGIC STUDY N/A 10/12/2015   Procedure: CARDIOVERSION;  Surgeon: TMinna Merritts MD;  Location: [facility] OR[facility];  Service: Cardiovascular;  Laterality: N/A;  . LEFT HEART CATH AND CORONARY ANGIOGRAPHY N/A 10/31/2016   Procedure: Left Heart Cath and Coronary Angiography;  Surgeon: AWellington Hampshire MD;  [facility];  Service: Cardiovascular;  Laterality: N/A;  . OTHER SURGICAL HISTORY  1998   Bypass    Family History  Family history unknown: Yes    Social History:  reports that he quit smoking about 45 years ago. His smoking use included cigarettes, pipe, and cigars. He has a 40.00 pack-year  smoking history. He quit smokeless tobacco use about 45 years ago. His smokeless tobacco use included chew. He reports that he drinks about 2.4 oz of alcohol per week. He reports that he does not use drugs.  Allergies:  Allergies  Allergen Reactions  . Pravastatin Other (See Comments)  . Prednisone Other (See Comments)    Pt  states that med makes him hyper Pt states that med makes him hyper    Medications: I have reviewed the patient's current medications.  Results for orders placed or performed during the hospital encounter of 08/26/17 (from the past 48 hour(s))  CBC     Status: Abnormal   Collection Time: 08/26/17  7:21 AM  Result Value Ref Range   WBC 14.8 (H) 3.8 - 10.6 K/uL   RBC 4.66 4.40 - 5.90 MIL/uL   Hemoglobin 14.2 13.0 - 18.0 g/dL   HCT 43.1 40.0 - 52.0 %   MCV 92.5 80.0 - 100.0 fL   MCH 30.4 26.0 - 34.0 pg   MCHC 32.9 32.0 - 36.0 g/dL   RDW 14.5 11.5 - 14.5 %   Platelets 166 150 - 440 K/uL    Comment: Performed at [facility], [address] [place], [place] [address]  Troponin I     Status: None   Collection Time: 08/26/17  7:21 AM  Result Value Ref Range   Troponin I <0.03 <0.03 ng/mL    Comment: Performed at [facility], [address] [place], [place] [address]  Comprehensive metabolic panel     Status: Abnormal   Collection Time: 08/26/17  7:21 AM  Result Value Ref Range   Sodium 135 135 - 145 mmol/L   Potassium 3.7 3.5 - 5.1 mmol/L   Chloride 102 101 - 111 mmol/L   CO2 26 22 - 32 mmol/L   Glucose, Bld 188 (H) 65 - 99 mg/dL   BUN 11 6 - 20 mg/dL   Creatinine, Ser 0.89 0.61 - 1.24 mg/dL   Calcium 7.9 (L) 8.9 - 10.3 mg/dL   Total Protein 6.3 (L) 6.5 - 8.1 g/dL   Albumin 2.8 (L) 3.5 - 5.0 g/dL   AST 14 (L) 15 - 41 U/L   ALT 13 (L) 17 - 63 U/L   Alkaline Phosphatase 78 38 - 126 U/L   Total Bilirubin 0.8 0.3 - 1.2 mg/dL   GFR calc non Af Amer >60 >60 mL/min   GFR calc Af Amer >60 >60 mL/min    Comment: (NOTE) The eGFR has been calculated using the CKD EPI equation. This calculation has not been validated in all clinical situations. eGFR's persistently <60 mL/min signify possible Chronic Kidney Disease.    Anion gap 7 5 - 15    Comment: Performed at [facility], [address], [place] [address]  Lactic acid, plasma     Status:  None   Collection Time: 08/26/17  9:08 AM  Result Value Ref Range   Lactic Acid, Venous 1.2 0.5 - 1.9 mmol/L    Comment: Performed at [facility], [address] [place], [place] [address]    Dg Chest 2 View  Result Date: 08/26/2017 CLINICAL DATA:  Shortness of breath for 2 days EXAM: CHEST - 2 VIEW COMPARISON:  August 10, 2017 FINDINGS: The heart size and mediastinal contours are stable. Heart size is enlarged. Patchy consolidation of the right lung base with right pleural effusion is unchanged. There is probable mild interstitial pulmonary edema bilaterally. The visualized skeletal structures  are stable. IMPRESSION: Cardiomegaly.  Mild interstitial edema. Patchy consolidation of right lung base with right pleural effusion. Electronically Signed   By: Abelardo Diesel M.D.   On: 08/26/2017 08:09    ROS Blood pressure (!) 166/75, pulse 72, temperature (!) 97.5 F (36.4 C), temperature source Oral, resp. rate 18, height 5' 6" (1.676 m), weight 255 lb (115.7 kg), SpO2 96 %. Physical Exam  Patient is awake, alert, oriented in no acute distress, he is presently resting comfortable Vital signs: Please see the above listed vital signs HEENT: No oral lesions appreciated, trachea is midline, no accessory muscle utilization noted, no thyromegaly appreciated Cardiovascular: Regular rate and rhythm Pulmonary: Scant mild basilar crackles on the right posterior lung zone Abdominal: Positive bowel sounds Extremities: No clubbing cyanosis or edema noted Neurologic: No focal deficits appreciated Cutaneous: No rashes or lesions noted  Assessment/Plan:  Patient with underlying pulmonary pathology to include both restrictive and obstructive lung disease, pulmonary embolism presently on anticoagulation, recent discharge from the hospital for pneumonia re-presents with increasing shortness of breath and pleuritic type chest pain.  Chest x-ray reveals a  small right pleural effusion along with some  lower lobe consolidation.  Agree with empiric coverage with vancomycin and cefepime.   Patient has been on Eliquis making thromboembolic disease recurrent less likely.  If symptoms progress and patient requires CT scan of the chest would do PA CT to assess pulmonary vasculature.  As of right now the treatment would not change he is on anticoagulation and empiric antibiotic therapy.  Would also recommend adequate pain control to prevent splinting and worsening atelectasis along with albuterol and Atrovent bronchodilator therapy.  Incentive spirometry  , 08/26/2017, 11:44 AM

## 2017-08-26 NOTE — Progress Notes (Signed)
Patient resting in bed. IV fluids infusing. No complaints of pain. Patient refusing bed alarm, educated on calling out for assistance when getting up and patient verbalized understanding. Continue to monitor.

## 2017-08-26 NOTE — ED Provider Notes (Signed)
[facility] Emergency Department Provider Note   ____________________________________________    I have reviewed the triage vital signs and the nursing notes.   HISTORY  Chief Complaint Shortness of Breath     HPI Andre [name] is a 82 y.o. male with a history as detailed below, recent admission for pneumonia presents with complaints of right-sided back pain as well as some radiation to the right upper quadrant when he moves primarily.  This started overnight he reports it only really bothers him when he moves.  He does report that his breathing seems worse as well however.  No fevers reported.  No chills reported.  No calf pain or swelling.  History of pulmonary fibrosis   Past Medical History:  Diagnosis Date  . Cervical spondylosis 10/01/2013  . Chronic diastolic CHF (congestive heart failure) ([place])    a. 07/2016 Echo: >55%; b. 10/2016 Echo: EF 55-60%, Gr1 DD, Ao sclerosis w/o stenosis, sev dil LA.  [name] Coronary artery disease    a. 1998 s/p mini-cabg @ [facility] - LIMA->LAD;  b. 07/2016 St Echo:  Inadequate HR (max 97) w/ hypertensive response (220/96). Ex time only 2:54 - stopped due to dyspnea and leg pain;  c.  08/2016 MV: EF 67%, no ischemia, low risk; d. 10/2016 NSTEMI/Cath: LM 40, LAD 100ost, RI 80, LCX nl, RCA 95p (4.0x26 Onyx DES), 51m, LIMA->LAD nl, EF 50-55%.  . Depression   . GERD (gastroesophageal reflux disease)   . Hyperlipidemia   . Hypertension   . PAF (paroxysmal atrial fibrillation) (HCC)    a. s/p DCCV-->maintaining sinus on amiodarone;  b. CHA2DS2VASc = 5-->eliquis.  . Parkinson's disease ([place])    tremors  . Pulmonary embolism ([place]) 2011  . 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 Sleep apnea    wears CPAP    Patient Active Problem List   Diagnosis Date Noted  . Diarrhea 08/21/2017  . Anemia 08/21/2017  . Acute respiratory failure ([place]) 08/11/2017  . Renal lesion 07/08/2017  . Fall 05/29/2017  . Lightheadedness 05/29/2017  .  Abrasion 02/08/2017  . Anxiety and depression 01/05/2017  . Prediabetes 10/27/2016  . Hypothyroidism 10/27/2016  . (HFpEF) heart failure with preserved ejection fraction ([place]) 09/27/2016  . Parkinson's disease ([place]) 06/30/2016  . PAF (paroxysmal atrial fibrillation) ([place])   . BMI 40.0-44.9, adult ([place]) 05/06/2015  . Erythrocytosis 01/28/2015  . Atherosclerosis of abdominal aorta ([place]) 12/08/2014  . Barrett's esophagus 12/31/2013  . DDD (degenerative disc disease), cervical 10/01/2013  . DDD (degenerative disc disease), lumbar 10/01/2013  . Coronary artery disease involving native coronary artery of native heart with angina pectoris ([place]) 10/30/2012  . GERD (gastroesophageal reflux disease) 10/30/2012  . Hyperlipidemia with target LDL less than 70 10/30/2012  . Essential hypertension 10/30/2012  . Dyspnea 09/10/2012  . Allergic rhinitis 09/10/2012  . OSA (obstructive sleep apnea) 09/10/2012    Past Surgical History:  Procedure Laterality Date  . BACK SURGERY  1960  . CARDIAC CATHETERIZATION    . CHOLECYSTECTOMY  2010  . CORONARY ARTERY BYPASS GRAFT  01/07/1997  . CORONARY STENT INTERVENTION N/A 10/31/2016   Procedure: Coronary Stent Intervention;  Surgeon: Wellington Hampshire, MD;  Location: [facility];  Service: Cardiovascular;  Laterality: N/A;  . ELECTROPHYSIOLOGIC STUDY N/A 07/14/2015   Procedure: CARDIOVERSION;  Surgeon: Yolonda Kida, MD;  Location: [facility];  Service: Cardiovascular;  Laterality: N/A;  . ELECTROPHYSIOLOGIC STUDY N/A 10/12/2015   Procedure: CARDIOVERSION;  Surgeon: Minna Merritts, MD;  Location:  [facility];  Service: Cardiovascular;  Laterality: N/A;  . LEFT HEART CATH AND CORONARY ANGIOGRAPHY N/A 10/31/2016   Procedure: Left Heart Cath and Coronary Angiography;  Surgeon: Wellington Hampshire, MD;  Location: [facility];  Service: Cardiovascular;  Laterality: N/A;  . OTHER SURGICAL HISTORY  1998   Bypass    Prior to Admission medications     Medication Sig Start Date End Date Taking? Authorizing Provider  acetaminophen (TYLENOL) 500 MG tablet Take 500 mg every 6 (six) hours as needed by mouth for mild pain or moderate pain.     [provider]  albuterol (PROVENTIL) (2.5 MG/3ML) 0.083% nebulizer solution Take 3 mLs (2.5 mg total) by nebulization every 4 (four) hours as needed for wheezing or shortness of breath. 08/24/17   Flora Lipps, MD  carbidopa-levodopa (SINEMET) 25-250 MG tablet Take 2 tablets by mouth 3 (three) times daily. Patient taking differently: Take 1.5 tablets by mouth 3 (three) times daily.  02/01/17   Kathrynn Ducking, MD  carvedilol (COREG) 6.25 MG tablet TAKE ONE TABLET BY MOUTH TWICE DAILY WITH A MEAL 07/24/17   Wellington Hampshire, MD  clopidogrel (PLAVIX) 75 MG tablet TAKE ONE TABLET BY MOUTH EVERY DAY WITH BREAKFAST 07/06/17   Wellington Hampshire, MD  cyclobenzaprine (FLEXERIL) 5 MG tablet Take 5 mg by mouth 3 (three) times daily as needed for muscle spasms.    [provider]  ELIQUIS 5 MG TABS tablet TAKE ONE TABLET TWICE DAILY 02/06/17   Wellington Hampshire, MD  entacapone (COMTAN) 200 MG tablet TAKE ONE TABLET BY MOUTH 3 TIMES DAILY 08/25/17   Kathrynn Ducking, MD  EPINEPHrine 0.3 mg/0.3 mL IJ SOAJ injection Inject 0.3 mg as directed once as needed (allergic reaction). Reported on 10/27/2015 07/01/13   [provider]  esomeprazole (NEXIUM) 20 MG capsule Take 40 mg by mouth daily at 12 noon.    [provider]  fluticasone (FLONASE) 50 MCG/ACT nasal spray TAKE 2 PUFFS IN EACH NOSTRIL EVERY DAY 10/18/16   Thersa Salt G, DO  furosemide (LASIX) 20 MG tablet Take 20 mg by mouth daily.    [provider]  HYDROcodone-acetaminophen (NORCO/VICODIN) 5-325 MG tablet Take 1 tablet by mouth as needed.  06/16/17   [provider]  isosorbide mononitrate (IMDUR) 60 MG 24 hr tablet Take 1 tablet (60 mg total) by mouth daily. 02/14/17   Wellington Hampshire, MD  levothyroxine (SYNTHROID,  LEVOTHROID) 50 MCG tablet Take 50 mcg by mouth daily before lunch.  09/07/16   [provider]  losartan (COZAAR) 100 MG tablet Take 1 tablet (100 mg total) by mouth daily. 02/14/17   Wellington Hampshire, MD  mirabegron ER (MYRBETRIQ) 50 MG TB24 tablet Take 50 mg by mouth daily.    [provider]  rosuvastatin (CRESTOR) 10 MG tablet Take 10 mg by mouth at bedtime.    [provider]  sertraline (ZOLOFT) 50 MG tablet 1 tablet daily for 2 weeks, then take 2 tablets daily Patient taking differently: Take 50 mg by mouth daily.  06/13/17   Kathrynn Ducking, MD  VENTOLIN HFA 108 [phone] Base) MCG/ACT inhaler TAKE 2 PUFFS EVERY 8 HOURS AS NEEDED FORWHEEZING 12/02/16   Coral Spikes, DO     Allergies Pravastatin and Prednisone  Family History  Family history unknown: Yes    Social History Social History   Tobacco Use  . Smoking status: Former Smoker    Packs/day: 1.00  Years: 40.00    Pack years: 40.00    Types: Cigarettes, Pipe, Cigars    Last attempt to quit: 04/18/1972    Years since quitting: 45.3  . Smokeless tobacco: Former Systems developer    Types: Chew    Quit date: 04/18/1972  Substance Use Topics  . Alcohol use: Yes    Alcohol/week: 2.4 oz    Types: 2 Cans of beer, 2 Shots of liquor per week    Comment: per 2 weeks   . Drug use: No    Review of Systems  Constitutional: No fever or chills Eyes: No visual changes.  ENT: No throat swelling Cardiovascular: Denies chest pain. Respiratory: As above Gastrointestinal: No nausea or vomiting Genitourinary: Negative for dysuria. Musculoskeletal: Cramping right-sided back pain, mid back Skin: Negative for rash. Neurological: Negative for headaches    ____________________________________________   PHYSICAL EXAM:  VITAL SIGNS: ED Triage Vitals  Enc Vitals Group     BP 08/26/17 0718 (!) 142/59     Pulse Rate 08/26/17 0718 85     Resp 08/26/17 0718 (!) 24     Temp 08/26/17 0718 98.4 F (36.9 C)     Temp  Source 08/26/17 0718 Oral     SpO2 08/26/17 0718 96 %     Weight 08/26/17 0719 115.7 kg (255 lb)     Height 08/26/17 0719 1.676 m (5\' 6" )     Head Circumference --      Peak Flow --      Pain Score 08/26/17 0719 0     Pain Loc --      Pain Edu? --      Excl. in [place]? --     Constitutional: Alert and oriented. Pleasant and interactive Eyes: Conjunctivae are normal.   Nose: No congestion/rhinnorhea. Mouth/Throat: Mucous membranes are moist.    Cardiovascular: Normal rate, regular rhythm. Grossly normal heart sounds.  Good peripheral circulation. Respiratory: Increased respiratory effort with tachypnea, lungs bibasilar mild rales Gastrointestinal: Soft and nontender. No distention.  No CVA tenderness.  Negative Murphy's  Musculoskeletal: No lower extremity tenderness nor edema.  Warm and well perfused Neurologic:  Normal speech and language. No gross focal neurologic deficits are appreciated.  Skin:  Skin is warm, dry and intact. No rash noted. Psychiatric: Mood and affect are normal. Speech and behavior are normal.  ____________________________________________   LABS (all labs ordered are listed, but only abnormal results are displayed)  Labs Reviewed  CBC - Abnormal; Notable for the following components:      Result Value   WBC 14.8 (*)    All other components within normal limits  COMPREHENSIVE METABOLIC PANEL - Abnormal; Notable for the following components:   Glucose, Bld 188 (*)    Calcium 7.9 (*)    Total Protein 6.3 (*)    Albumin 2.8 (*)    AST 14 (*)    ALT 13 (*)    All other components within normal limits  TROPONIN I  LACTIC ACID, PLASMA  LACTIC ACID, PLASMA   ____________________________________________  EKG  ED ECG REPORT I, Lavonia Drafts, the attending physician, personally viewed and interpreted this ECG.  Date: 08/26/2017  Rhythm: normal sinus rhythm QRS Axis: normal Intervals: normal ST/T Wave abnormalities: None specific changes Narrative  Interpretation: Initial EKG severely limited by motion, once patient's breathing has improved we will retry  ____________________________________________  RADIOLOGY  Chest x-ray consistent with right lower lobe pneumonia ____________________________________________   PROCEDURES  Procedure(s) performed: No  Procedures   Critical  Care performed: No ____________________________________________   INITIAL IMPRESSION / ASSESSMENT AND PLAN / ED COURSE  Pertinent labs & imaging results that were available during my care of the patient were reviewed by me and considered in my medical decision making (see chart for details).  Patient with a history of pulmonary fibrosis, paroxysmal A. fib, CAD, CHF presents with right-sided back pain, worsening shortness of breath.  Cardiac monitor, oxygen started.  Will obtain x-ray, labs and consider CT scan   X-ray demonstrates right lower lobe consolidation consistent with pneumonia.  White blood cell count is elevated.  Given worsening shortness of breath and pain in that area we will treat as H CAP and admit to the hospital    ____________________________________________   FINAL CLINICAL IMPRESSION(S) / ED DIAGNOSES  Final diagnoses:  HCAP (healthcare-associated pneumonia)  Shortness of breath        Note:  This document was prepared using Dragon voice recognition software and may include unintentional dictation errors.    Lavonia Drafts, MD 08/26/17 [phone]

## 2017-08-27 LAB — BASIC METABOLIC PANEL
Anion gap: 6 (ref 5–15)
BUN: 13 mg/dL (ref 6–20)
CO2: 27 mmol/L (ref 22–32)
Calcium: 7.7 mg/dL — ABNORMAL LOW (ref 8.9–10.3)
Chloride: 104 mmol/L (ref 101–111)
Creatinine, Ser: 0.8 mg/dL (ref 0.61–1.24)
GFR calc Af Amer: >60 mL/min (ref >60)
GFR calc non Af Amer: >60 mL/min (ref >60)
Glucose, Bld: 156 mg/dL — ABNORMAL HIGH (ref 65–99)
Potassium: 3.4 mmol/L — ABNORMAL LOW (ref 3.5–5.1)
Sodium: 137 mmol/L (ref 135–145)

## 2017-08-27 LAB — CBC
HCT: 43.7 % (ref 40.0–52.0)
Hemoglobin: 14.4 g/dL (ref 13.0–18.0)
MCH: 31.1 pg (ref 26.0–34.0)
MCHC: 33 g/dL (ref 32.0–36.0)
MCV: 94.1 fL (ref 80.0–100.0)
Platelets: 130 10*3/uL — ABNORMAL LOW (ref 150–440)
RBC: 4.65 MIL/uL (ref 4.40–5.90)
RDW: 14.6 % — ABNORMAL HIGH (ref 11.5–14.5)
WBC: 9.5 10*3/uL (ref 3.8–10.6)

## 2017-08-27 MED ORDER — PREMIER PROTEIN SHAKE
11.0000 [oz_av] | ORAL | Status: DC
  Administered 2017-08-27 – 2017-08-28 (×2): 11 [oz_av] via ORAL

## 2017-08-27 MED ORDER — IPRATROPIUM-ALBUTEROL 0.5-2.5 (3) MG/3ML IN SOLN
3.0000 mL | Freq: Four times a day (QID) | RESPIRATORY_TRACT | Status: DC | PRN
  Administered 2017-08-27 – 2017-08-28 (×2): 3 mL via RESPIRATORY_TRACT
  Filled 2017-08-27 (×2): qty 3

## 2017-08-27 MED ORDER — CEFDINIR 300 MG PO CAPS
300.0000 mg | ORAL_CAPSULE | Freq: Two times a day (BID) | ORAL | Status: DC
  Administered 2017-08-27 – 2017-08-28 (×3): 300 mg via ORAL
  Filled 2017-08-27 (×3): qty 1

## 2017-08-27 MED ORDER — FUROSEMIDE 10 MG/ML IJ SOLN
40.0000 mg | Freq: Once | INTRAMUSCULAR | Status: AC
  Administered 2017-08-27: 40 mg via INTRAVENOUS
  Filled 2017-08-27: qty 4

## 2017-08-27 MED ORDER — METHYLPREDNISOLONE SODIUM SUCC 40 MG IJ SOLR
40.0000 mg | Freq: Once | INTRAMUSCULAR | Status: AC
  Administered 2017-08-27: 40 mg via INTRAVENOUS
  Filled 2017-08-27: qty 1

## 2017-08-27 MED ORDER — IPRATROPIUM-ALBUTEROL 0.5-2.5 (3) MG/3ML IN SOLN
3.0000 mL | Freq: Three times a day (TID) | RESPIRATORY_TRACT | Status: DC
  Administered 2017-08-27 – 2017-08-28 (×5): 3 mL via RESPIRATORY_TRACT
  Filled 2017-08-27 (×12): qty 3

## 2017-08-27 MED ORDER — ADULT MULTIVITAMIN W/MINERALS CH
1.0000 | ORAL_TABLET | Freq: Every day | ORAL | Status: DC
  Administered 2017-08-27 – 2017-08-28 (×2): 1 via ORAL
  Filled 2017-08-27 (×2): qty 1

## 2017-08-27 NOTE — Progress Notes (Signed)
Patient complaining of SOB difficulty breathing. Breathing treatment given by the nurse. MD notified. Incentive spirometry and solumedrol ordered.

## 2017-08-27 NOTE — Progress Notes (Signed)
Initial Nutrition Assessment  DOCUMENTATION CODES:   Morbid obesity  INTERVENTION:  Provide Premier Protein po once daily, each supplement provides 160 kcal and 30 grams of protein.  Provide daily MVI.  Encouraged adequate intake of calories and protein at meals to prevent any further unintentional weight loss/loss of lean body mass.  NUTRITION DIAGNOSIS:   Increased nutrient needs related to catabolic illness(COPD, pulmonary fibrosis, CHF) as evidenced by estimated needs.  GOAL:   Patient will meet greater than or equal to 90% of their needs  MONITOR:   PO intake, Supplement acceptance, Labs, Weight trends, I & O's  REASON FOR ASSESSMENT:   Malnutrition Screening Tool    ASSESSMENT:   82 year old male with PMHx of HTN, Parkinson's disease, depression, HLD, pulmonary fibrosis, OSA, GERD, paroxysmal A-fib, chronic diastolic CHF, CAD s/p CABG 1998 now admitted with PNA.   Met with patient and his wife at bedside. Patient reports he has had a decreased appetite for about one month now in setting of difficulty breathing. He has been trying to eat 2-3 meals per day but reports he was only finishing <25% of meals. His appetite is starting to improve now and he ate 50% of his meals yesterday and feels like he will be able to eat even more of his meals today.  Patient reports UBW was 272 lbs. Per chart he was 271.5 lbs on 07/24/2017 and 253.6 lbs on 08/21/2017. That is a weight loss of 17.9 lbs (6.6% body weight) over one month, which is significant for time frame. However, unsure how much of that weight loss may have been related to fluid.  Meal Completion: 50% of lunch and dinner yesterday  Medications reviewed and include: Eliquis, carvedilol, levothyroxine.  Labs reviewed: Potassium 3.4.  Patient does not meet criteria for malnutrition at this time, but is at risk for malnutrition with significant unintentional weight loss.  Discussed with RN.  NUTRITION - FOCUSED PHYSICAL  EXAM:    Most Recent Value  Orbital Region  No depletion  Upper Arm Region  No depletion  Thoracic and Lumbar Region  No depletion  Buccal Region  No depletion  Temple Region  No depletion  Clavicle Bone Region  No depletion  Clavicle and Acromion Bone Region  No depletion  Scapular Bone Region  No depletion  Dorsal Hand  No depletion  Patellar Region  No depletion  Anterior Thigh Region  No depletion  Posterior Calf Region  No depletion  Edema (RD Assessment)  Mild  Hair  Reviewed  Eyes  Reviewed  Mouth  Reviewed  Skin  Reviewed  Nails  Reviewed     Diet Order:   Diet Order           Diet regular Room service appropriate? Yes; Fluid consistency: Thin  Diet effective now          EDUCATION NEEDS:   Education needs have been addressed  Skin:  Skin Assessment: Reviewed RN Assessment  Last BM:  PTA (08/25/2017 per chart)  Height:   Ht Readings from Last 1 Encounters:  08/26/17 _0  (1.676 m)    Weight:   Wt Readings from Last 1 Encounters:  08/26/17 255 lb (115.7 kg)    Ideal Body Weight:  64.5 kg  BMI:  Body mass index is 41.16 kg/m.  Estimated Nutritional Needs:   Kcal:  2000-2360 (MSJ x 1.1-1.3)  Protein:  115-130 grams (1-1.1 grams/kg)  Fluid:  1.6-1.9 L/day (25-30 mL/kg IBW)  Willey Blade, MS, RD, LDN  Office: [phone] Pager: [phone] After Hours/Weekend Pager: [phone]

## 2017-08-27 NOTE — Progress Notes (Signed)
[facility] [place] NAME: Andre [name]    MR#:  [id number]  DATE OF BIRTH:  [birth year]  SUBJECTIVE:  patient feels a little better. No productive cough. Some shortness of breath.  REVIEW OF SYSTEMS:   Review of Systems  Constitutional: Negative for chills, fever and weight loss.  HENT: Negative for ear discharge, ear pain and nosebleeds.   Eyes: Negative for blurred vision, pain and discharge.  Respiratory: Positive for shortness of breath. Negative for sputum production, wheezing and stridor.   Cardiovascular: Positive for leg swelling. Negative for chest pain, palpitations, orthopnea and PND.  Gastrointestinal: Negative for abdominal pain, diarrhea, nausea and vomiting.  Genitourinary: Negative for frequency and urgency.  Musculoskeletal: Negative for back pain and joint pain.  Neurological: Negative for sensory change, speech change, focal weakness and weakness.  Psychiatric/Behavioral: Negative for depression and hallucinations. The patient is not nervous/anxious.    Tolerating Diet:yes Tolerating PT: patient is  self ambulatory  DRUG ALLERGIES:   Allergies  Allergen Reactions  . Pravastatin Other (See Comments)  . Prednisone Other (See Comments)    Pt states that med makes him hyper Pt states that med makes him hyper    VITALS:  Blood pressure (!) 160/80, pulse 65, temperature 98.6 F (37 C), temperature source Oral, resp. rate 18, height 5\' 6"  (1.676 m), weight 115.7 kg (255 lb), SpO2 97 %.  PHYSICAL EXAMINATION:   Physical Exam  GENERAL:  82 y.o.-year-old patient lying in the bed with no acute distress. Obese EYES: Pupils equal, round, reactive to light and accommodation. No scleral icterus. Extraocular muscles intact.  HEENT: Head atraumatic, normocephalic. Oropharynx and nasopharynx clear.  NECK:  Supple, no jugular venous distention. No thyroid enlargement, no tenderness.  LUNGS: Normal breath sounds  bilaterally, no wheezing, rales, rhonchi. No use of accessory muscles of respiration.  CARDIOVASCULAR: S1, S2 normal. No murmurs, rubs, or gallops.  ABDOMEN: Soft, nontender, nondistended. Bowel sounds present. No organomegaly or mass.  EXTREMITIES: No cyanosis, clubbing or edema b/l.    NEUROLOGIC: Cranial nerves II through XII are intact. No focal Motor or sensory deficits b/l.   PSYCHIATRIC:  patient is alert and oriented x 3.  SKIN: No obvious rash, lesion, or ulcer.   LABORATORY PANEL:  CBC Recent Labs  Lab 08/27/17 0419  WBC 9.5  HGB 14.4  HCT 43.7  PLT 130*    Chemistries  Recent Labs  Lab 08/26/17 0721 08/27/17 0419  NA 135 137  K 3.7 3.4*  CL 102 104  CO2 26 27  GLUCOSE 188* 156*  BUN 11 13  CREATININE 0.89 0.80  CALCIUM 7.9* 7.7*  AST 14*  --   ALT 13*  --   ALKPHOS 78  --   BILITOT 0.8  --    Cardiac Enzymes Recent Labs  Lab 08/26/17 0721  TROPONINI <0.03   RADIOLOGY:  Dg Chest 2 View  Result Date: 08/26/2017 CLINICAL DATA:  Shortness of breath for 2 days EXAM: CHEST - 2 VIEW COMPARISON:  August 10, 2017 FINDINGS: The heart size and mediastinal contours are stable. Heart size is enlarged. Patchy consolidation of the right lung base with right pleural effusion is unchanged. There is probable mild interstitial pulmonary edema bilaterally. The visualized skeletal structures are stable. IMPRESSION: Cardiomegaly.  Mild interstitial edema. Patchy consolidation of right lung base with right pleural effusion. Electronically Signed   By: Abelardo Diesel M.D.   On: 08/26/2017 08:09   ASSESSMENT AND PLAN:  82 year old male with a history of COPD, Parkinson's disease, OSA on CPAP, interstitial lung disease who presents with increasing shortness of breath.  1.  Acute hypoxic respiratory failure in the setting of CHF acute on chronic diastolic and right lower lobe infiltrate which appears same as previous x-ray likely resolving pneumonia -no fever. White count normal to  9.5, lactic acid 1.2 -MRSA PCR negative -discontinue IV antibiotic change to oral Ceftin   2. acute on chronic diastolic congestive heart failure. Elevated BNP to 296 -Iv lasix x 1 today -previous echo showed EF of 55%. Will not repeat.  3.  Interstitial lung disease: appears chronic. Patient can follow up with pulmonary as outpatient if needed. Will defer this to primary care physician  4. Acute on  Chronic diastolic heart failure  Continue Lasix -has leg edema and elevated BNP  5.  COPD without signs of exacerbation -no wheezing. Continue nebs and inhalers. No indication for steroid  6.  OSA: Patient's family will bring in CPAP  7.  Morbid obesity: Recommend significant weight loss and changing diet  8.  Parkinson's disease: Continue Sinemet  9.  CAD: Continue Coreg, Plavix  10.  Hypothyroid is not: Continue Synthroid  11.  Essential hypertension: Continue Cozaar and Coreg  12.  PAF: Continue Eliquis  overall improving slowly. Will continue diuresis today. If continues to show improvement discharged home tomorrow. Patient agreeable.   Case discussed with Care Management/Social Worker. Management plans discussed with the patient, family and they are in agreement.  CODE STATUS: full  DVT Prophylaxis: eliquis  TOTAL TIME TAKING CARE OF THIS PATIENT: *30* minutes.  >50% time spent on counselling and coordination of care  POSSIBLE D/C IN *1 to 2* DAYS, DEPENDING ON CLINICAL CONDITION.  Note: This dictation was prepared with Dragon dictation along with smaller phrase technology. Any transcriptional errors that result from this process are unintentional.  Fritzi Mandes M.D on 08/27/2017 at 9:08 AM  Between 7am to 6pm - Pager - [phone]  After 6pm go to [email or web address] - password [email or web address] [facility]  Office  [phone]  CC: Primary care physician; [name], MDPatient ID: Andre [name], male   DOB: 1935/11/04, 82 y.o.   MRN:  [id number]

## 2017-08-27 NOTE — Progress Notes (Signed)
PT Cancellation Note  Patient Details Name: ALVIN DIFFEE MRN: [id number] DOB: [birth year]   Cancelled Treatment:    Reason Eval/Treat Not Completed: PT screened, no needs identified, will sign off.  Order received.  Chart reviewed.  Pt presents with no deficits requiring PT at this time.  Will complete order.  Please consult if need arises in the future.   Roxanne Gates, PT, DPT 08/27/2017, 11:46 AM

## 2017-08-28 ENCOUNTER — Telehealth: Payer: Self-pay | Admitting: Cardiovascular Disease

## 2017-08-28 ENCOUNTER — Encounter: Payer: Self-pay | Admitting: Physician Assistant

## 2017-08-28 ENCOUNTER — Other Ambulatory Visit: Payer: PPO

## 2017-08-28 ENCOUNTER — Inpatient Hospital Stay (HOSPITAL_COMMUNITY)
Admit: 2017-08-28 | Discharge: 2017-08-28 | Disposition: A | Discharge status: Home / Self Care | Payer: PPO | Attending: Physician Assistant | Admitting: Physician Assistant

## 2017-08-28 DIAGNOSIS — R0600 Dyspnea, unspecified: Secondary | ICD-10-CM

## 2017-08-28 DIAGNOSIS — J181 Lobar pneumonia, unspecified organism: Secondary | ICD-10-CM

## 2017-08-28 DIAGNOSIS — R0789 Other chest pain: Secondary | ICD-10-CM

## 2017-08-28 LAB — LIPID PANEL
Cholesterol: 146 mg/dL (ref 0–200)
HDL: 43 mg/dL (ref >40)
LDL Cholesterol: 76 mg/dL (ref 0–99)
Total CHOL/HDL Ratio: 3.4 RATIO
Triglycerides: 134 mg/dL (ref <150)
VLDL: 27 mg/dL (ref 0–40)

## 2017-08-28 LAB — TROPONIN I: Troponin I: 0.13 ng/mL (ref <0.03)

## 2017-08-28 LAB — ECHOCARDIOGRAM COMPLETE
Height: 66 in
Weight: 4116.43 oz

## 2017-08-28 MED ORDER — ISOSORBIDE MONONITRATE ER 30 MG PO TB24: 90.0000 mg | ORAL_TABLET | Freq: Every day | ORAL | Status: DC

## 2017-08-28 MED ORDER — CEFDINIR 300 MG PO CAPS: 300.0000 mg | ORAL_CAPSULE | Freq: Two times a day (BID) | ORAL | 0 refills | Status: DC

## 2017-08-28 MED ORDER — ISOSORBIDE MONONITRATE ER 60 MG PO TB24: 90.0000 mg | ORAL_TABLET | Freq: Every day | ORAL | 3 refills | Status: DC

## 2017-08-28 MED ORDER — FUROSEMIDE 20 MG PO TABS
20.0000 mg | ORAL_TABLET | Freq: Every day | ORAL | Status: DC
  Administered 2017-08-28: 20 mg via ORAL
  Filled 2017-08-28: qty 1

## 2017-08-28 MED ORDER — POTASSIUM CHLORIDE CRYS ER 20 MEQ PO TBCR
40.0000 meq | EXTENDED_RELEASE_TABLET | Freq: Once | ORAL | Status: AC
  Administered 2017-08-28: 40 meq via ORAL
  Filled 2017-08-28: qty 2

## 2017-08-28 NOTE — Telephone Encounter (Signed)
Patient is in hospital, RM 140 Patient wife calling to state that he is still experiencing chest pain and would like for Dr. Fletcher Anon to come by room Please advise

## 2017-08-28 NOTE — Progress Notes (Signed)
*  PRELIMINARY RESULTS* Echocardiogram 2D Echocardiogram has been performed.  Sherrie Sport 08/28/2017, 2:23 PM

## 2017-08-28 NOTE — Progress Notes (Signed)
08/28/2017 6:08 PM  [name] to be D/C'd Home per MD order.  Discussed prescriptions and follow up appointments with the patient. Prescriptions given to patient, medication list explained in detail. Pt verbalized understanding.  Allergies as of 08/28/2017      Reactions   Pravastatin Other (See Comments)   Prednisone Other (See Comments)   Pt states that med makes him hyper Pt states that med makes him hyper      Medication List    STOP taking these medications   entacapone 200 MG tablet Commonly known as:  COMTAN   sertraline 50 MG tablet Commonly known as:  ZOLOFT     TAKE these medications   carbidopa-levodopa 25-250 MG tablet Commonly known as:  SINEMET Take 2 tablets by mouth 3 (three) times daily.   carvedilol 6.25 MG tablet Commonly known as:  COREG TAKE ONE TABLET BY MOUTH TWICE DAILY WITH A MEAL   cefdinir 300 MG capsule Commonly known as:  OMNICEF Take 1 capsule (300 mg total) by mouth every 12 (twelve) hours.   clopidogrel 75 MG tablet Commonly known as:  PLAVIX TAKE ONE TABLET BY MOUTH EVERY DAY WITH BREAKFAST   cyclobenzaprine 5 MG tablet Commonly known as:  FLEXERIL Take 5 mg by mouth 3 (three) times daily as needed for muscle spasms.   ELIQUIS 5 MG Tabs tablet Generic drug:  apixaban TAKE ONE TABLET TWICE DAILY   EPINEPHrine 0.3 mg/0.3 mL Soaj injection Commonly known as:  EPI-PEN Inject 0.3 mg as directed once as needed (allergic reaction). Reported on 10/27/2015   esomeprazole 20 MG capsule Commonly known as:  NEXIUM Take 40 mg by mouth daily at 12 noon.   fluticasone 50 MCG/ACT nasal spray Commonly known as:  FLONASE TAKE 2 PUFFS IN EACH NOSTRIL EVERY DAY   furosemide 20 MG tablet Commonly known as:  LASIX Take 20 mg by mouth daily.   HYDROcodone-acetaminophen 5-325 MG tablet Commonly known as:  NORCO/VICODIN Take 1 tablet by mouth daily as needed (neck pain).   isosorbide mononitrate 60 MG 24 hr tablet Commonly known as:   IMDUR Take 1.5 tablets (90 mg total) by mouth daily. What changed:  how much to take   levothyroxine 50 MCG tablet Commonly known as:  SYNTHROID, LEVOTHROID Take 50 mcg by mouth daily before lunch.   losartan 100 MG tablet Commonly known as:  COZAAR Take 1 tablet (100 mg total) by mouth daily.   MYRBETRIQ 50 MG Tb24 tablet Generic drug:  mirabegron ER Take 50 mg by mouth daily.   rosuvastatin 10 MG tablet Commonly known as:  CRESTOR Take 10 mg by mouth at bedtime.   TYLENOL 500 MG tablet Generic drug:  acetaminophen Take 500 mg every 6 (six) hours as needed by mouth for mild pain or moderate pain.   VENTOLIN HFA 108 (90 Base) MCG/ACT inhaler Generic drug:  albuterol TAKE 2 PUFFS EVERY 8 HOURS AS NEEDED FORWHEEZING   albuterol (2.5 MG/3ML) 0.083% nebulizer solution Commonly known as:  PROVENTIL Take 3 mLs (2.5 mg total) by nebulization every 4 (four) hours as needed for wheezing or shortness of breath.       Vitals:   08/28/17 0725 08/28/17 1643  BP: (!) 170/84 133/60  Pulse: 66 61  Resp: 20 20  Temp: 98.3 F (36.8 C) (!) 97.4 F (36.3 C)  SpO2: 96% 93%    Skin clean, dry and intact without evidence of skin break down, no evidence of skin tears noted. IV catheter discontinued  intact. Site without signs and symptoms of complications. Dressing and pressure applied. Pt denies pain at this time. No complaints noted.  An After Visit Summary was printed and given to the patient. Patient escorted via [place], and D/C home via private auto.  Dola Argyle

## 2017-08-28 NOTE — Discharge Summary (Signed)
[facility] at [place] NAME: Andre [name]    MR#:  [id number]  DATE OF BIRTH:  [birth year]  DATE OF ADMISSION:  08/26/2017 ADMITTING PHYSICIAN: Bettey Costa, MD  DATE OF DISCHARGE: 08/28/2017  PRIMARY CARE PHYSICIAN: [name], MD    ADMISSION DIAGNOSIS:  Shortness of breath [R06.02] HCAP (healthcare-associated pneumonia) [J18.9]  DISCHARGE DIAGNOSIS:  acute hypoxic respiratory failure secondary to acute on chronic CHF diastolic ongoing treatment for pneumonia atypical chest pain COPD exacerbation morbid obesity/sleep apnea  SECONDARY DIAGNOSIS:   Past Medical History:  Diagnosis Date  . Cervical spondylosis 10/01/2013  . Chronic diastolic CHF (congestive heart failure) ([facility])    a. 07/2016 Echo: >55%; b. 10/2016 Echo: EF 55-60%, Gr1 DD, Ao sclerosis w/o stenosis, sev dil LA.  [name] Coronary artery disease    a. 1998 s/p mini-cabg @ [facility] - LIMA->LAD;  b. 07/2016 St Echo:  Inadequate HR (max 97) w/ hypertensive response (220/96). Ex time only 2:54 - stopped due to dyspnea and leg pain;  c.  08/2016 MV: EF 67%, no ischemia, low risk; d. 10/2016 NSTEMI/Cath: LM 40, LAD 100ost, RI 80, LCX nl, RCA 95p (4.0x26 Onyx DES), 25m, LIMA->LAD nl, EF 50-55%.  . Depression   . GERD (gastroesophageal reflux disease)   . Hyperlipidemia   . Hypertension   . PAF (paroxysmal atrial fibrillation) ([facility])    a. s/p DCCV-->maintaining sinus on amiodarone;  b. CHA2DS2VASc = 5-->eliquis.  . Parkinson's disease ([facility])    tremors  . Pulmonary embolism ([facility] [facility]) 2011  . 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 Sleep apnea    wears CPAP    HOSPITAL COURSE:   82 year old male with a history of COPD, Parkinson's disease, OSA on CPAP,interstitial lung disease who presents with increasing shortness of breath.  1. Acute hypoxic respiratory failure in the setting of CHF acute on chronic diastolic and right lower lobe infiltrate which appears same as previous x-ray  likely resolving pneumonia -no fever. White count normal to 9.5, lactic acid 1.2 -MRSA PCR negative -discontinue IV antibiotic change to oral Ceftin  2. acute on chronic diastolic congestive heart failure. Elevated BNP to 296 -Iv lasix x 1 today-- diuresis well. Saturation stable on room air. -previous echo showed EF of 55%. Will not repeat.  3. Interstitial lung disease: appears chronic. Patient can follow up with pulmonary as outpatient if needed. Will defer this to primary care physician  4. Acute on Chronic diastolic heart failure  Continue Lasix -has leg edema and elevated BNP  5. COPD without signs of exacerbation -no wheezing. Continue nebs and inhalers. No indication for steroid  6. OSA: Patient's family will bring in CPAP  7. Morbid obesity: Recommend significant weight loss and changing diet  8. Parkinson's disease: Continue Sinemet  9. CAD: Continue Coreg, Plavix -patient had some atypical chest this morning. Was seen by cardiology. Increased is important 90 mg.  -Is chest pain free.  -EKG no acute changes  -troponin .13   10. Hypothyroid is not: Continue Synthroid  11. Essential hypertension: Continue Cozaar and Coreg  12. PAF: Continue Eliquis  overall improving slowly.  Patient waiting to be seen by cardiology Dr. Fletcher Anon. He is been seen by  Christell Faith.  CONSULTS OBTAINED:  Treatment Team:  Hermelinda Dellen, DO Wellington Hampshire, MD  DRUG ALLERGIES:   Allergies  Allergen Reactions  . Pravastatin Other (See Comments)  . Prednisone Other (See Comments)    Pt states that med makes him hyper  Pt states that med makes him hyper    DISCHARGE MEDICATIONS:   Allergies as of 08/28/2017      Reactions   Pravastatin Other (See Comments)   Prednisone Other (See Comments)   Pt states that med makes him hyper Pt states that med makes him hyper      Medication List    STOP taking these medications   entacapone 200 MG tablet Commonly  known as:  COMTAN   sertraline 50 MG tablet Commonly known as:  ZOLOFT     TAKE these medications   carbidopa-levodopa 25-250 MG tablet Commonly known as:  SINEMET Take 2 tablets by mouth 3 (three) times daily.   carvedilol 6.25 MG tablet Commonly known as:  COREG TAKE ONE TABLET BY MOUTH TWICE DAILY WITH A MEAL   cefdinir 300 MG capsule Commonly known as:  OMNICEF Take 1 capsule (300 mg total) by mouth every 12 (twelve) hours.   clopidogrel 75 MG tablet Commonly known as:  PLAVIX TAKE ONE TABLET BY MOUTH EVERY DAY WITH BREAKFAST   cyclobenzaprine 5 MG tablet Commonly known as:  FLEXERIL Take 5 mg by mouth 3 (three) times daily as needed for muscle spasms.   ELIQUIS 5 MG Tabs tablet Generic drug:  apixaban TAKE ONE TABLET TWICE DAILY   EPINEPHrine 0.3 mg/0.3 mL Soaj injection Commonly known as:  EPI-PEN Inject 0.3 mg as directed once as needed (allergic reaction). Reported on 10/27/2015   esomeprazole 20 MG capsule Commonly known as:  NEXIUM Take 40 mg by mouth daily at 12 noon.   fluticasone 50 MCG/ACT nasal spray Commonly known as:  FLONASE TAKE 2 PUFFS IN EACH NOSTRIL EVERY DAY   furosemide 20 MG tablet Commonly known as:  LASIX Take 20 mg by mouth daily.   HYDROcodone-acetaminophen 5-325 MG tablet Commonly known as:  NORCO/VICODIN Take 1 tablet by mouth daily as needed (neck pain).   isosorbide mononitrate 60 MG 24 hr tablet Commonly known as:  IMDUR Take 1.5 tablets (90 mg total) by mouth daily. What changed:  how much to take   levothyroxine 50 MCG tablet Commonly known as:  SYNTHROID, LEVOTHROID Take 50 mcg by mouth daily before lunch.   losartan 100 MG tablet Commonly known as:  COZAAR Take 1 tablet (100 mg total) by mouth daily.   MYRBETRIQ 50 MG Tb24 tablet Generic drug:  mirabegron ER Take 50 mg by mouth daily.   rosuvastatin 10 MG tablet Commonly known as:  CRESTOR Take 10 mg by mouth at bedtime.   TYLENOL 500 MG tablet Generic drug:   acetaminophen Take 500 mg every 6 (six) hours as needed by mouth for mild pain or moderate pain.   VENTOLIN HFA 108 (90 Base) MCG/ACT inhaler Generic drug:  albuterol TAKE 2 PUFFS EVERY 8 HOURS AS NEEDED FORWHEEZING   albuterol (2.5 MG/3ML) 0.083% nebulizer solution Commonly known as:  PROVENTIL Take 3 mLs (2.5 mg total) by nebulization every 4 (four) hours as needed for wheezing or shortness of breath.       If you experience worsening of your admission symptoms, develop shortness of breath, life threatening emergency, suicidal or homicidal thoughts you must seek medical attention immediately by calling 911 or calling your MD immediately  if symptoms less severe.  You Must read complete instructions/literature along with all the possible adverse reactions/side effects for all the Medicines you take and that have been prescribed to you. Take any new Medicines after you have completely understood and accept all the possible adverse  reactions/side effects.   Please note  You were cared for by a hospitalist during your hospital stay. If you have any questions about your discharge medications or the care you received while you were in the hospital after you are discharged, you can call the unit and asked to speak with the hospitalist on call if the hospitalist that took care of you is not available. Once you are discharged, your primary care physician will handle any further medical issues. Please note that NO REFILLS for any discharge medications will be authorized once you are discharged, as it is imperative that you return to your primary care physician (or establish a relationship with a primary care physician if you do not have one) for your aftercare needs so that they can reassess your need for medications and monitor your lab values. Today   SUBJECTIVE    Patient had mild chest pain on the right side earlier. Now resolved. Wife in the room. VITAL SIGNS:  Blood pressure (!) 170/84,  pulse 66, temperature 98.3 F (36.8 C), temperature source Oral, resp. rate 20, height 5\' 6"  (1.676 m), weight 116.7 kg (257 lb 4.4 oz), SpO2 96 %.  I/O:    Intake/Output Summary (Last 24 hours) at 08/28/2017 1611 Last data filed at 08/28/2017 1402 Gross per 24 hour  Intake 1120 ml  Output 600 ml  Net 520 ml    PHYSICAL EXAMINATION:  GENERAL:  82 y.o.-year-old patient lying in the bed with no acute distress. morbid obesity EYES: Pupils equal, round, reactive to light and accommodation. No scleral icterus. Extraocular muscles intact.  HEENT: Head atraumatic, normocephalic. Oropharynx and nasopharynx clear.  NECK:  Supple, no jugular venous distention. No thyroid enlargement, no tenderness.  LUNGS: Normal breath sounds bilaterally, no wheezing, rales,rhonchi or crepitation. No use of accessory muscles of respiration.  CARDIOVASCULAR: S1, S2 normal. No murmurs, rubs, or gallops.  ABDOMEN: Soft, non-tender, non-distended. Bowel sounds present. No organomegaly or mass.  EXTREMITIES: No pedal edema, cyanosis, or clubbing.  NEUROLOGIC: Cranial nerves II through XII are intact. Muscle strength 5/5 in all extremities. Sensation intact. Gait not checked.  PSYCHIATRIC: The patient is alert and oriented x 3.  SKIN: No obvious rash, lesion, or ulcer.   DATA REVIEW:   CBC  Recent Labs  Lab 08/27/17 0419  WBC 9.5  HGB 14.4  HCT 43.7  PLT 130*    Chemistries  Recent Labs  Lab 08/26/17 0721 08/27/17 0419  NA 135 137  K 3.7 3.4*  CL 102 104  CO2 26 27  GLUCOSE 188* 156*  BUN 11 13  CREATININE 0.89 0.80  CALCIUM 7.9* 7.7*  AST 14*  --   ALT 13*  --   ALKPHOS 78  --   BILITOT 0.8  --     Microbiology Results   Recent Results (from the past 240 hour(s))  MRSA PCR Screening     Status: None   Collection Time: 08/26/17 11:54 AM  Result Value Ref Range Status   MRSA by PCR NEGATIVE NEGATIVE Final    Comment:        The GeneXpert MRSA Assay (FDA approved for NASAL  specimens only), is one component of a comprehensive MRSA colonization surveillance program. It is not intended to diagnose MRSA infection nor to guide or monitor treatment for MRSA infections. Performed at [facility], [address], [place], [place] [address]     RADIOLOGY:  No results found.   Management plans discussed with the patient, family and  they are in agreement.  CODE STATUS:     Code Status Orders  (From admission, onward)        Start     Ordered   08/26/17 1123  Full code  Continuous     08/26/17 1123    Code Status History    Date Active Date Inactive Code Status Order ID Comments User Context   08/11/2017 0409 08/13/2017 0204 Full Code [id number]  Amelia Jo, MD Inpatient   10/28/2016 1219 11/02/2016 1323 Full Code [id number]  Demetrios Loll, MD Inpatient    Advance Directive Documentation     Most Recent Value  Type of Advance Directive  Healthcare Power of [place], Living will  Pre-existing out of facility DNR order (yellow form or pink MOST form)  -  "MOST" Form in Place?  -      TOTAL TIME TAKING CARE OF THIS PATIENT: *40* minutes.    Fritzi Mandes M.D on 08/28/2017 at 4:11 PM  Between 7am to 6pm - Pager - [phone] After 6pm go to [email or web address] - password [email or web address] [facility]  Office  [phone]  CC: Primary care physician; [name], MD

## 2017-08-28 NOTE — Plan of Care (Signed)

## 2017-08-28 NOTE — Progress Notes (Signed)
* [facility]     Assessment and Plan:  Acute on chronic hypoxic respiratory failure with acute pneumonia. Pulmonary fibrosis Effective sleep apnea. History of pulmonary embolism, currently on anticoagulation. Obesity, deconditioning.  Continue antibiotics, duo nebs CPAP at night Will monitor progress. Advance activity as tolerated.  Date: 08/28/2017  MRN# [id number] Andre [name] 12-31-1935   Andre [name] is a 82 y.o. old male seen in follow up for chief complaint of  Chief Complaint  Patient presents with  . Shortness of Breath     HPI:  The patient is a 82 year old male, followed in the outpatient setting in our office Dr. Mortimer Fries strictly artery disease, OSA on CPAP, history of PE Eliquis, Plavix.  Is also noted to have pulmonary fibrosis patient presented to the hospital on 08/26/2017 with findings consistent with pneumonia.  Patient has been started on Omnicef. Currently the patient is feeling better than when he presented.  He has no new complaints.  Images personally reviewed chest x-ray 08/26/2017; right basilar atelectasis   Medication:    Current Facility-Administered Medications:  .  acetaminophen (TYLENOL) tablet 650 mg, 650 mg, Oral, Q6H PRN **OR** acetaminophen (TYLENOL) suppository 650 mg, 650 mg, Rectal, Q6H PRN, Mody, Sital, MD .  apixaban (ELIQUIS) tablet 5 mg, 5 mg, Oral, BID, Mody, Sital, MD, 5 mg at 08/28/17 0959 .  carbidopa-levodopa (SINEMET IR) 25-250 MG per tablet immediate release 2 tablet, 2 tablet, Oral, TID, Bettey Costa, MD, 2 tablet at 08/28/17 0959 .  carvedilol (COREG) tablet 6.25 mg, 6.25 mg, Oral, BID WC, Mody, Sital, MD, 6.25 mg at 08/28/17 0813 .  cefdinir (OMNICEF) capsule 300 mg, 300 mg, Oral, Q12H, Fritzi Mandes, MD, 300 mg at 08/28/17 1153 .  clopidogrel (PLAVIX) tablet 75 mg, 75 mg, Oral, Daily, Mody, Sital, MD, 75 mg at 08/28/17 0959 .  cyclobenzaprine (FLEXERIL) tablet 5 mg, 5 mg, Oral, TID PRN, Benjie Karvonen, Sital, MD, 5 mg  at 08/28/17 0446 .  furosemide (LASIX) tablet 20 mg, 20 mg, Oral, Daily, Fritzi Mandes, MD, 20 mg at 08/28/17 0959 .  HYDROcodone-acetaminophen (NORCO/VICODIN) 5-325 MG per tablet 1 tablet, 1 tablet, Oral, Daily PRN, Bettey Costa, MD, 1 tablet at 08/28/17 0401 .  ipratropium-albuterol (DUONEB) 0.5-2.5 (3) MG/3ML nebulizer solution 3 mL, 3 mL, Nebulization, TID, Conforti, John, DO, 3 mL at 08/28/17 0731 .  ipratropium-albuterol (DUONEB) 0.5-2.5 (3) MG/3ML nebulizer solution 3 mL, 3 mL, Nebulization, Q6H PRN, Conforti, John, DO, 3 mL at 08/28/17 0445 .  [START ON 08/29/2017] isosorbide mononitrate (IMDUR) 24 hr tablet 90 mg, 90 mg, Oral, Daily, Dunn, Ryan M, PA-C .  levothyroxine (SYNTHROID, LEVOTHROID) tablet 50 mcg, 50 mcg, Oral, QPC breakfast, Mody, Sital, MD, 50 mcg at 08/28/17 0813 .  losartan (COZAAR) tablet 100 mg, 100 mg, Oral, Daily, Mody, Sital, MD, 100 mg at 08/28/17 0959 .  mirabegron ER (MYRBETRIQ) tablet 50 mg, 50 mg, Oral, Daily, Mody, Sital, MD, 50 mg at 08/28/17 0959 .  multivitamin with minerals tablet 1 tablet, 1 tablet, Oral, Daily, Fritzi Mandes, MD, 1 tablet at 08/27/17 1614 .  ondansetron (ZOFRAN) tablet 4 mg, 4 mg, Oral, Q6H PRN **OR** ondansetron (ZOFRAN) injection 4 mg, 4 mg, Intravenous, Q6H PRN, Mody, Sital, MD .  polyethylene glycol (MIRALAX / GLYCOLAX) packet 17 g, 17 g, Oral, Daily PRN, Mody, Sital, MD .  protein supplement (PREMIER PROTEIN) liquid, 11 oz, Oral, Q24H, Fritzi Mandes, MD, 11 oz at 08/27/17 1337 .  rosuvastatin (CRESTOR) tablet 10 mg, 10 mg, Oral,  Mayford Knife, MD, 10 mg at 08/27/17 2150   Allergies:  Pravastatin and Prednisone  Review of Systems: Gen:  Denies  fever, sweats. HEENT: Denies blurred vision. Cvc:  No dizziness, chest pain or heaviness Resp:   Denies cough or sputum porduction. Gi: Denies swallowing difficulty, stomach pain. constipation, bowel incontinence Gu:  Denies bladder incontinence, burning urine Ext:   No Joint pain,  stiffness. Skin: No skin rash, easy bruising. Endoc:  No polyuria, polydipsia. Psych: No depression, insomnia. Other:  All other systems were reviewed and found to be negative other than what is mentioned in the HPI.   Physical Examination:   VS: BP (!) 170/84 (BP Location: Left Arm)   Pulse 66   Temp 98.3 F (36.8 C) (Oral)   Resp 20   Ht 5\' 6"  (1.676 m)   Wt 257 lb 4.4 oz (116.7 kg)   SpO2 96%   BMI 41.53 kg/m    General Appearance: No distress  Neuro:without focal findings,  speech normal,  HEENT: PERRLA, EOM intact. Pulmonary: normal breath sounds, No wheezing.   CardiovascularNormal S1,S2.  No m/r/g.   Abdomen: Benign, Soft, non-tender. Renal:  No costovertebral tenderness  GU:  Not performed at this time. Endoc: No evident thyromegaly, no signs of acromegaly. Skin:   warm, no rash. Extremities: normal, no cyanosis, clubbing.   LABORATORY PANEL:   CBC Recent Labs  Lab 08/27/17 0419  WBC 9.5  HGB 14.4  HCT 43.7  PLT 130*   ------------------------------------------------------------------------------------------------------------------  Chemistries  Recent Labs  Lab 08/26/17 0721 08/27/17 0419  NA 135 137  K 3.7 3.4*  CL 102 104  CO2 26 27  GLUCOSE 188* 156*  BUN 11 13  CREATININE 0.89 0.80  CALCIUM 7.9* 7.7*  AST 14*  --   ALT 13*  --   ALKPHOS 78  --   BILITOT 0.8  --    ------------------------------------------------------------------------------------------------------------------  Cardiac Enzymes Recent Labs  Lab 08/28/17 1041  TROPONINI 0.13*   ------------------------------------------------------------  RADIOLOGY:   No results found for this or any previous visit. Results for orders placed during the hospital encounter of 08/26/17  DG Chest 2 View   Narrative CLINICAL DATA:  Shortness of breath for 2 days  EXAM: CHEST - 2 VIEW  COMPARISON:  August 10, 2017  FINDINGS: The heart size and mediastinal contours are stable.  Heart size is enlarged. Patchy consolidation of the right lung base with right pleural effusion is unchanged. There is probable mild interstitial pulmonary edema bilaterally. The visualized skeletal structures are stable.  IMPRESSION: Cardiomegaly.  Mild interstitial edema.  Patchy consolidation of right lung base with right pleural effusion.   Electronically Signed   By: Abelardo Diesel M.D.   On: 08/26/2017 08:09    ------------------------------------------------------------------------------------------------------------------  Thank  you for allowing [facility] to assist in the care of your patient. Our recommendations are noted above.  Please contact us if we can be of further service.   Marda Stalker, MD.  [facility] Office Number: [phone]  Patricia Pesa, M.D.  Merton Border, M.D  08/28/2017

## 2017-08-28 NOTE — Consult Note (Signed)
Cardiology Consultation:   Patient ID: TENOCH MCCLURE; [id number]; 1936/02/25   Admit date: 08/26/2017 Date of Consult: 08/28/2017  Primary Care Provider: [name], MD Primary Cardiologist: Fletcher Anon   Patient Profile:   Andre [name] is a 82 y.o. male with a hx of CAD s/p 1-vessel CABG in 1998 with a LIMA to LAD, chronic diastolic CHF, PAF on Eliquis, PE, ILD, Parkinson's disease, COPD secondary to prior tobacco abuse, HTN, HLD, and morbid obesity with OSA on CPAP who is being seen today for the evaluation of chest pain at the request of Dr. Posey Pronto.  History of Present Illness:   Mr. Stepter was admitted in 10/2016 with a NSTEMI. Echo showed normal LV systolic function with a severely dilated left atrium. He underwent cardiac cath that showed an occluded native LAD with patent LIMA-LAD, severe proximal RCA stenosis with heavy thrombosis and moderate distal left main stenosis with significant stenosis in the ramus branch, EF 50-55% with mildly elevated LVEDP. He underwent successful PCI/DES to the RCA which was complicated by embolization into a large RV branch at the lesion site which was treated with PTCA and established TIMI 1 flow. Following this, he was noted to have worsening SOB which was felt to be multifactorial including chronic diastolic CHF, ILD, physical deconditioning, and allergies. He underwent PFTs which was suggestive of restrictive disease. CT chest showed early pulmonary fibrosis and COPD. Due to those changes, amiodarone was stopped in early 06/2017.   Patient was recently admitted to [facility] in late 07/2017 with acute respiratory failure with hypoxia felt to be secondary to AECOPD/CAP/pulmonary fibrosis. He was treated with ABX, nebs, and steroids per IM. He was noted to have a mild anemia with GI recommending outpatient follow up. He was discharged on 2 L supplemental oxygen.  Patient [facility] on 5/11 with worsening SOB and DOE as well as right-sided pleuritic  chest pain. CXR showed mild interstitial edema with patchy consolidation of the right lung base with right pleural effusion. He was placed on IV ABX per pulmonology.  There was less of a concern for PE given he has been on anticoagulation for his PAF with Eliquis. On 5/12, he was felt to have more of a CHF exacerbation in combination with improving PNA. He was changed to PO ABX per IM and given IV Lasix on 5/12 with a documented UOP of 500 mL and a net + 1.7 L for the admission. Weight this morning of 257 pounds, down one pound from discharge in late 07/2017.  Labs showed troponin < 0.03, x 1, not cycled, BNP 293, PCT < 0.10, SCr 0.89-->0.80, K+ 3.5-->3.4, glucose 188, albumin 2.8, WBC 14.8-->9.5, HGB 14.2-->14.4. BP has remained suboptimally controlled from 010 to 932 systolic.  Patient has noted brief episodes of chest pain occurring at 3 AM on the past 2 days that has improved with nebulizer each time, prompting cardiology consult. Currently, symptom free. Pain does not feel like his prior MI. Pain is not reproducible to palpation, cough, or deep inspiration. States, "I feel great now."  Past Medical History:  Diagnosis Date  . Cervical spondylosis 10/01/2013  . Chronic diastolic CHF (congestive heart failure) ([place])    a. 07/2016 Echo: >55%; b. 10/2016 Echo: EF 55-60%, Gr1 DD, Ao sclerosis w/o stenosis, sev dil LA.  [name] Coronary artery disease    a. 1998 s/p mini-cabg @ [facility] - LIMA->LAD;  b. 07/2016 St Echo:  Inadequate HR (max 97) w/ hypertensive response (220/96). Ex  time only 2:54 - stopped due to dyspnea and leg pain;  c.  08/2016 MV: EF 67%, no ischemia, low risk; d. 10/2016 NSTEMI/Cath: LM 40, LAD 100ost, RI 80, LCX nl, RCA 95p (4.0x26 Onyx DES), 26m, LIMA->LAD nl, EF 50-55%.  . Depression   . GERD (gastroesophageal reflux disease)   . Hyperlipidemia   . Hypertension   . PAF (paroxysmal atrial fibrillation) (HCC)    a. s/p DCCV-->maintaining sinus on amiodarone;  b. CHA2DS2VASc = 5-->eliquis.  .  Parkinson's disease ([place])    tremors  . Pulmonary embolism ([place]) 2011  . 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 Sleep apnea    wears CPAP    Past Surgical History:  Procedure Laterality Date  . BACK SURGERY  1960  . CARDIAC CATHETERIZATION    . CHOLECYSTECTOMY  2010  . CORONARY ARTERY BYPASS GRAFT  01/07/1997  . CORONARY STENT INTERVENTION N/A 10/31/2016   Procedure: Coronary Stent Intervention;  Surgeon: Wellington Hampshire, MD;  Location: [place] CV LAB;  Service: Cardiovascular;  Laterality: N/A;  . ELECTROPHYSIOLOGIC STUDY N/A 07/14/2015   Procedure: CARDIOVERSION;  Surgeon: Yolonda Kida, MD;  Location: [facility] ORS;  Service: Cardiovascular;  Laterality: N/A;  . ELECTROPHYSIOLOGIC STUDY N/A 10/12/2015   Procedure: CARDIOVERSION;  Surgeon: Minna Merritts, MD;  Location: [facility] ORS;  Service: Cardiovascular;  Laterality: N/A;  . LEFT HEART CATH AND CORONARY ANGIOGRAPHY N/A 10/31/2016   Procedure: Left Heart Cath and Coronary Angiography;  Surgeon: Wellington Hampshire, MD;  Location: [place] CV LAB;  Service: Cardiovascular;  Laterality: N/A;  . OTHER SURGICAL HISTORY  1998   Bypass     Home Meds: Prior to Admission medications   Medication Sig Start Date End Date Taking? Authorizing Provider  acetaminophen (TYLENOL) 500 MG tablet Take 500 mg every 6 (six) hours as needed by mouth for mild pain or moderate pain.    Yes [provider]  carbidopa-levodopa (SINEMET) 25-250 MG tablet Take 2 tablets by mouth 3 (three) times daily. 02/01/17  Yes Kathrynn Ducking, MD  carvedilol (COREG) 6.25 MG tablet TAKE ONE TABLET BY MOUTH TWICE DAILY WITH A MEAL 07/24/17  Yes Wellington Hampshire, MD  clopidogrel (PLAVIX) 75 MG tablet TAKE ONE TABLET BY MOUTH EVERY DAY WITH BREAKFAST 07/06/17  Yes Wellington Hampshire, MD  cyclobenzaprine (FLEXERIL) 5 MG tablet Take 5 mg by mouth 3 (three) times daily as needed for muscle spasms.   Yes [provider]  ELIQUIS 5 MG TABS tablet TAKE  ONE TABLET TWICE DAILY 02/06/17  Yes [name] A, MD  EPINEPHrine 0.3 mg/0.3 mL IJ SOAJ injection Inject 0.3 mg as directed once as needed (allergic reaction). Reported on 10/27/2015 07/01/13  Yes [provider]  esomeprazole (NEXIUM) 20 MG capsule Take 40 mg by mouth daily at 12 noon.   Yes [provider]  fluticasone (FLONASE) 50 MCG/ACT nasal spray TAKE 2 PUFFS IN EACH NOSTRIL EVERY DAY 10/18/16  Yes Cook, Jayce G, DO  furosemide (LASIX) 20 MG tablet Take 20 mg by mouth daily.   Yes [provider]  HYDROcodone-acetaminophen (NORCO/VICODIN) 5-325 MG tablet Take 1 tablet by mouth daily as needed (neck pain).  06/16/17  Yes [provider]  isosorbide mononitrate (IMDUR) 60 MG 24 hr tablet Take 1 tablet (60 mg total) by mouth daily. 02/14/17  Yes Wellington Hampshire, MD  levothyroxine (SYNTHROID, LEVOTHROID) 50 MCG tablet Take 50 mcg by mouth daily before lunch.  09/07/16  Yes [provider]  losartan (COZAAR) 100 MG tablet Take 1 tablet (100 mg total) by mouth daily. 02/14/17  Yes Wellington Hampshire, MD  mirabegron ER (MYRBETRIQ) 50 MG TB24 tablet Take 50 mg by mouth daily.   Yes [provider]  rosuvastatin (CRESTOR) 10 MG tablet Take 10 mg by mouth at bedtime.   Yes [provider]  VENTOLIN HFA 108 (90 Base) MCG/ACT inhaler TAKE 2 PUFFS EVERY 8 HOURS AS NEEDED FORWHEEZING 12/02/16  Yes Cook, Jayce G, DO  albuterol (PROVENTIL) (2.5 MG/3ML) 0.083% nebulizer solution Take 3 mLs (2.5 mg total) by nebulization every 4 (four) hours as needed for wheezing or shortness of breath. 08/24/17   Flora Lipps, MD  entacapone (COMTAN) 200 MG tablet TAKE ONE TABLET BY MOUTH 3 TIMES DAILY Patient not taking: Reported on 08/26/2017 08/25/17   Kathrynn Ducking, MD  sertraline (ZOLOFT) 50 MG tablet 1 tablet daily for 2 weeks, then take 2 tablets daily Patient not taking: Reported on 08/26/2017 06/13/17   Kathrynn Ducking, MD    Inpatient  Medications: Scheduled Meds: . apixaban  5 mg Oral BID  . carbidopa-levodopa  2 tablet Oral TID  . carvedilol  6.25 mg Oral BID WC  . cefdinir  300 mg Oral Q12H  . clopidogrel  75 mg Oral Daily  . furosemide  20 mg Oral Daily  . ipratropium-albuterol  3 mL Nebulization TID  . isosorbide mononitrate  60 mg Oral Daily  . levothyroxine  50 mcg Oral QPC breakfast  . losartan  100 mg Oral Daily  . mirabegron ER  50 mg Oral Daily  . multivitamin with minerals  1 tablet Oral Daily  . protein supplement shake  11 oz Oral Q24H  . rosuvastatin  10 mg Oral QHS   Continuous Infusions:  PRN Meds: acetaminophen **OR** acetaminophen, cyclobenzaprine, HYDROcodone-acetaminophen, ipratropium-albuterol, ondansetron **OR** ondansetron (ZOFRAN) IV, polyethylene glycol  Allergies:   Allergies  Allergen Reactions  . Pravastatin Other (See Comments)  . Prednisone Other (See Comments)    Pt states that med makes him hyper Pt states that med makes him hyper    Social History:   Social History   Socioeconomic History  . Marital status: Married    Spouse name: [name]  . Number of children: 1  . Years of education: 84  . Highest education level: Not on file  Occupational History  . Occupation: Retired    Comment: Designer, television/film set  Social Needs  . Financial resource strain: Not on file  . Food insecurity:    Worry: Not on file    Inability: Not on file  . Transportation needs:    Medical: Not on file    Non-medical: Not on file  Tobacco Use  . Smoking status: Former Smoker    Packs/day: 1.00    Years: 40.00    Pack years: 40.00    Types: Cigarettes, Pipe, Cigars    Last attempt to quit: 04/18/1972    Years since quitting: 45.3  . Smokeless tobacco: Former Systems developer    Types: [place] date: 04/18/1972  Substance and Sexual Activity  . Alcohol use: Yes    Alcohol/week: 2.4 oz    Types: 2 Cans of beer, 2 Shots of liquor per week    Comment: per 2 weeks   . Drug use: No  . Sexual  activity: Yes  Lifestyle  . Physical activity:    Days per week: Not on file    Minutes per session: Not on file  .  Stress: Not on file  Relationships  . Social connections:    Talks on phone: Not on file    Gets together: Not on file    Attends religious service: Not on file    Active member of club or organization: Not on file    Attends meetings of clubs or organizations: Not on file    Relationship status: Not on file  . Intimate partner violence:    Fear of current or ex partner: Not on file    Emotionally abused: Not on file    Physically abused: Not on file    Forced sexual activity: Not on file  Other Topics Concern  . Not on file  Social History Narrative   Lives w/ wife   Caffeine use: none   Right-handed     Family History:   Family History  Problem Relation Age of Onset  . Alcohol abuse Father     ROS:  Review of Systems  Constitutional: Positive for malaise/fatigue. Negative for chills, diaphoresis, fever and weight loss.  HENT: Negative for congestion.   Eyes: Negative for discharge and redness.  Respiratory: Positive for shortness of breath. Negative for cough, hemoptysis, sputum production and wheezing.   Cardiovascular: Positive for chest pain. Negative for palpitations, orthopnea, claudication, leg swelling and PND.  Gastrointestinal: Negative for abdominal pain, blood in stool, heartburn, melena, nausea and vomiting.  Genitourinary: Negative for hematuria.  Musculoskeletal: Negative for falls and myalgias.  Skin: Negative for rash.  Neurological: Positive for weakness. Negative for dizziness, tingling, tremors, sensory change, speech change, focal weakness and loss of consciousness.  Endo/Heme/Allergies: Does not bruise/bleed easily.  Psychiatric/Behavioral: Negative for substance abuse. The patient is not nervous/anxious.   All other systems reviewed and are negative.     Physical Exam/Data:   Vitals:   08/27/17 1918 08/27/17 2348 08/28/17 0500  08/28/17 0725  BP:  139/63  (!) 170/84  Pulse: 69 (!) 59  66  Resp: 18 20  20   Temp:  98.1 F (36.7 C)  98.3 F (36.8 C)  TempSrc:  Axillary  Oral  SpO2: 95% 95%  96%  Weight:   257 lb 4.4 oz (116.7 kg)   Height:        Intake/Output Summary (Last 24 hours) at 08/28/2017 0902 Last data filed at 08/28/2017 0433 Gross per 24 hour  Intake 760 ml  Output 500 ml  Net 260 ml   Filed Weights   08/26/17 0719 08/28/17 0500  Weight: 255 lb (115.7 kg) 257 lb 4.4 oz (116.7 kg)   Body mass index is 41.53 kg/m.   Physical Exam: General: Well developed, well nourished, in no acute distress. Head: Normocephalic, atraumatic, sclera non-icteric, no xanthomas, nares without discharge.  Neck: Negative for carotid bruits. JVD not elevated. Lungs: Clear bilaterally to auscultation without wheezes, rales, or rhonchi. Breathing is unlabored. Heart: RRR with S1 S2. No murmurs, rubs, or gallops appreciated. Abdomen: Soft, non-tender, non-distended with normoactive bowel sounds. No hepatomegaly. No rebound/guarding. No obvious abdominal masses. Msk:  Strength and tone appear normal for age. Extremities: No clubbing or cyanosis. No edema. Distal pedal pulses are 2+ and equal bilaterally. Neuro: Alert and oriented X 3. No facial asymmetry. No focal deficit. Moves all extremities spontaneously. Psych:  Responds to questions appropriately with a normal affect.   EKG:  The EKG was personally reviewed and demonstrates: NSR, 73 bpm, left anterior fascicular block, poor R wave progression, baseline wandering, no acute st/t changes  Telemetry:  Telemetry was personally  reviewed and demonstrates: not on telemetry   Weights: Filed Weights   08/26/17 0719 08/28/17 0500  Weight: 255 lb (115.7 kg) 257 lb 4.4 oz (116.7 kg)    Relevant CV Studies: LHC 10/2016: Conclusion     Mid RCA lesion, 40 %stenosed.  The left ventricular systolic function is normal.  LV end diastolic pressure is mildly  elevated.  The left ventricular ejection fraction is 50-55% by visual estimate.  LM lesion, 40 %stenosed.  Ost LAD lesion, 100 %stenosed.  Ost Ramus to Ramus lesion, 80 %stenosed.  Ramus lesion, 80 %stenosed.  LIMA graft was visualized by non-selective angiography and is normal in caliber and anatomically normal.  A drug eluting stent was successfully placed.  Prox RCA to Mid RCA lesion, 95 %stenosed.  Post intervention, there is a 0% residual stenosis.   1. Significant underlying three-vessel coronary artery disease with occluded LAD and patent LIMA to LAD. Severe proximal RCA stenosis with heavy thrombus burden is the culprit for non-ST elevation myocardial infarction. There is also moderate distal left main stenosis and significant disease in the ramus intermedius branch. 2. Low normal LV systolic function with an EF of 50-55%. Mildly elevated left ventricular end-diastolic pressure. 3. Successful angioplasty and drug-eluting stent placement to the proximal right coronary artery. This was complicated by embolization into a large RV branch originating at the lesion. Balloon angioplasty was performed on the ostium of this branch via the stent struts which established TIMI 1 flow. The procedure was overall difficult due to large thrombus burden.  Recommendations: I started the patient on nitroglycerin drip for chest pain control. Monitor in the ICU overnight. The patient will likely have an infarct in the RV branch distribution.  Continue dual antiplatelet therapy for now. Eliquis can be resumed tomorrow.    TTE 10/2016: Study Conclusions  - Procedure narrative: Transthoracic echocardiography. Image   quality was poor. The study was technically difficult, as a   result of poor sound wave transmission. - Left ventricle: The cavity size was normal. Wall thickness was   increased in a pattern of mild LVH. Systolic function was normal.   The estimated ejection fraction was in the  range of 55% to 60%.   Doppler parameters are consistent with abnormal left ventricular   relaxation (grade 1 diastolic dysfunction). Doppler parameters   are consistent with high ventricular filling pressure. - Aortic valve: Trileaflet; moderately thickened, mildly calcified   leaflets. Noncoronary cusp mobility was restricted. Sclerosis   without stenosis. There was no significant regurgitation. - Left atrium: The atrium was severely dilated. - Right ventricle: The cavity size was normal. Wall thickness was   mildly increased. Systolic function was normal.  Laboratory Data:  Chemistry Recent Labs  Lab 08/26/17 0721 08/27/17 0419  NA 135 137  K 3.7 3.4*  CL 102 104  CO2 26 27  GLUCOSE 188* 156*  BUN 11 13  CREATININE 0.89 0.80  CALCIUM 7.9* 7.7*  GFRNONAA >60 >60  GFRAA >60 >60  ANIONGAP 7 6    Recent Labs  Lab 08/26/17 0721  PROT 6.3*  ALBUMIN 2.8*  AST 14*  ALT 13*  ALKPHOS 78  BILITOT 0.8   Hematology Recent Labs  Lab 08/22/17 1401 08/26/17 0721 08/27/17 0419  WBC 19.3 Repeated and verified X2.* 14.8* 9.5  RBC 5.04 4.66 4.65  HGB 15.4 14.2 14.4  HCT 46.8 43.1 43.7  MCV 93.0 92.5 94.1  MCH  --  30.4 31.1  MCHC 32.8 32.9 33.0  RDW 14.1  14.5 14.6*  PLT 189.0 166 130*   Cardiac Enzymes Recent Labs  Lab 08/26/17 0721  TROPONINI <0.03   No results for input(s): TROPIPOC in the last 168 hours.  BNP Recent Labs  Lab 08/26/17 0721  BNP 293.0*    DDimer No results for input(s): DDIMER in the last 168 hours.  Radiology/Studies:  Dg Chest 2 View  Result Date: 08/26/2017 IMPRESSION: Cardiomegaly.  Mild interstitial edema. Patchy consolidation of right lung base with right pleural effusion. Electronically Signed   By: Abelardo Diesel M.D.   On: 08/26/2017 08:09    Assessment and Plan:   1. Atypical chest pain/SOB: -Currently symptom-free -Not reproducible to palpation, with cough or deep inspiration  -Does not feel like the pain he had in 10/2016 at  the time of his NSTEMI -Cycle troponin to rule out -Has had 2 episodes around 3 AM the past 2 morning that have both resolved with a nebulizer -Check echo as below -His dyspnea is likely multifactorial including ILD, chronic diastolic CHF, morbid obesity and physical deconditioning   2. CAD of native coronary arteries as above: -As above -Continue current medical therapy including Eliquis, Coreg, Imdur, Lasix, losartan, and Crestor   3. PAF: -Not on telemetry  -Appears to be regular rhythm on exam -Eliquis -Coreg  4. Chronic diastolic CHF/possible pulmonary HTN: -He does not appear grossly volume up, though assessing his volume status is difficult given his body habitus  -Check echo as this was scheduled for 08/31/17 to help evaluate his pulmonary pressure given his SOB -Cannot rule out pulmonary hypertension playing a role in his dyspnea given his ILD -Coreg, Lasix  5. ILD: -As above -Per IM/pulmonology -Repeat echo as above  6. Recent PNA: -ABX per IM  7. HTN: -Remains suboptimally controlled -Increase Imdur to 90 mg daily -Continue Coreg, losartan, and Lasix  8. HLD: -Crestor -LDL of 92 in 10/2016 -Goal LDL < 70 -Check lipid panel -If not at goal, consider referral to [place] for possible PCSK-9 inhibitor   9. Hypokalemia: -Replete to goal > 4.0   For questions or updates, please contact [place] Please consult [email or web address] for contact info under Cardiology/STEMI.   Signed, Christell Faith, PA-C [place] Pager: [phone] 08/28/2017, 9:02 AM

## 2017-08-28 NOTE — Telephone Encounter (Signed)
S/w wife, ok per DPR. Patient currently in the hospital.  She said Andre [name] was in there earlier and said Dr Fletcher Anon would come by today. Advised that if he's having pain right at this moment of any issues while in the hospital, they need to let his nurse know first. Advised to let the nurse know if something is needed sooner. She said patient was not having any pain at this moment. Wife verbalized understanding.

## 2017-08-29 ENCOUNTER — Telehealth: Payer: Self-pay | Admitting: Family Medicine

## 2017-08-29 NOTE — Telephone Encounter (Signed)
Transition Care Management Follow-up Telephone Call  How have you been since you were released from the hospital? Patient breathing better just still feels weak.   Do you understand why you were in the hospital? yes   Do you understand the discharge instrcutions? yes  Items Reviewed:  Medications reviewed: yes  Allergies reviewed: yes  Dietary changes reviewed: yes  Referrals reviewed: yes   Functional Questionnaire:   Activities of Daily Living (ADLs):   He states they are independent in the following: ambulation, bathing and hygiene, feeding, continence, grooming, toileting and dressing States they require assistance with the following: No assistance needed at this time    Any transportation issues/concerns?: no   Any patient concerns? no   Confirmed importance and date/time of follow-up visits scheduled: yes   Confirmed with patient if condition begins to worsen call PCP or go to the ER.  Patient was given the Call-a-Nurse line [phone]: yes

## 2017-08-29 NOTE — Telephone Encounter (Signed)
Transition Care Management Follow-up Telephone Call  How have you been since you were released from the hospital? Patient feeling better and breathing easier.   Do you understand why you were in the hospital? yes   Do you understand the discharge instrcutions? yes  Items Reviewed:  Medications reviewed: yes  Allergies reviewed: yes  Dietary changes reviewed: yes  Referrals reviewed: yes   Functional Questionnaire:   Activities of Daily Living (ADLs):   He states they are independent in the following: ambulation, bathing and hygiene, feeding, continence, grooming, toileting and dressing States they require assistance with the following: Patient needs no assistance at this time.   Any transportation issues/concerns?: no   Any patient concerns? no   Confirmed importance and date/time of follow-up visits scheduled: yes   Confirmed with patient if condition begins to worsen call PCP or go to the ER.  Patient was given the Call-a-Nurse line [phone]: yes

## 2017-08-30 ENCOUNTER — Ambulatory Visit: Payer: PPO | Admitting: Family Medicine

## 2017-08-30 DIAGNOSIS — L578 Other skin changes due to chronic exposure to nonionizing radiation: Secondary | ICD-10-CM | POA: Diagnosis not present

## 2017-08-30 DIAGNOSIS — L570 Actinic keratosis: Secondary | ICD-10-CM | POA: Diagnosis not present

## 2017-08-30 DIAGNOSIS — D692 Other nonthrombocytopenic purpura: Secondary | ICD-10-CM | POA: Diagnosis not present

## 2017-08-30 DIAGNOSIS — Z85828 Personal history of other malignant neoplasm of skin: Secondary | ICD-10-CM | POA: Diagnosis not present

## 2017-08-31 ENCOUNTER — Ambulatory Visit: Payer: Self-pay | Admitting: *Deleted

## 2017-08-31 ENCOUNTER — Other Ambulatory Visit: Payer: PPO

## 2017-08-31 NOTE — Telephone Encounter (Signed)
Left message to return call, ok for pec to speak to patient about message below 

## 2017-08-31 NOTE — Telephone Encounter (Signed)
Please advise 

## 2017-08-31 NOTE — Telephone Encounter (Signed)
Given that his dizziness has not changed with discontinuing the Zoloft I do not think the Zoloft was contributing.  He could start back on the Zoloft for his depression.  If they would like to try an alternative medication I could also send one in.  Please ensure he is not having any thoughts of harming himself.  If he is he should go to the emergency room.  Thanks.

## 2017-08-31 NOTE — Telephone Encounter (Signed)
Wife called in for husband.  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   It has not affected that.  He is still experiencing the dizziness. The main reason for calling is he has become depressed and anxious.   He don't want to do anything but sit around.  He is also having anxiety. Can Dr. [name] call in an antidepressant for him?   He has an appt coming up with Dr. [name] but she did not want to wait that long.  I have routed a note to Dr. [name]. I let her know someone would be in contact with her.  Reason for Disposition . [1] Started on anti-depressant medications < 2 weeks ago AND [2] not feeling any better    Zoloft discontinued by Dr. [name] due to dizziness being triggered by it.   Dizziness unchanged.  He is now depressed and anxious.   Wife asking if Dr. [name] can call in an antidepressant for him.  Answer Assessment - Initial Assessment Questions 1. DESCRIPTION: "Describe your dizziness."     Dr. [name] discontinued his Zoloft.   Been in and out of the hospital with pneumonia recently.     He has an appt with the ENT coming up for the dizziness.   When came off the Zoloft thought it would help the dizziness but it did not.     He is now depressed and having anxiety now.   Can he take something else.   He just wants to sit around and do nothing now.  He is anxious and depressed.   To see Dr. [name] on 5/23.   I wanted to know if he can call in an antidepressant.    2. LIGHTHEADED: "Do you feel lightheaded?" (e.g., somewhat faint, woozy, weak upon standing)     *No Answer* 3. VERTIGO: "Do you feel like either you or the room is spinning or tilting?" (i.e. vertigo)     *No Answer* 4. SEVERITY: "How bad is it?"  "Do you feel like you are going to faint?" "Can you stand and walk?"   - MILD - walking normally   - MODERATE - interferes with normal activities (e.g., work, school)    - SEVERE - unable to stand, requires  support to walk, feels like passing out now.      *No Answer* 5. ONSET:  "When did the dizziness begin?"     He has been having the dizziness for a while per wife.  He is to see an ENT.   Dr. [name] stopped the Zoloft thinking it might be causing the dizziness.    He is still dizzy. 6. AGGRAVATING FACTORS: "Does anything make it worse?" (e.g., standing, change in head position)     *No Answer* 7. HEART RATE: "Can you tell me your heart rate?" "How many beats in 15 seconds?"  (Note: not all patients can do this)       *No Answer* 8. CAUSE: "What do you think is causing the dizziness?"     *No Answer* 9. RECURRENT SYMPTOM: "Have you had dizziness before?" If so, ask: "When was the last time?" "What happened that time?"     *No Answer* 10. OTHER SYMPTOMS: "Do you have any other symptoms?" (e.g., fever, chest pain, vomiting, diarrhea, bleeding)       He is anxious and depressed since stopping the Zoloft. 11. PREGNANCY: "Is there any chance you are pregnant?" "When was your last menstrual period?"  N/A  Answer Assessment - Initial Assessment Questions 1. CONCERN: "What happened that made you call today?"     Wife is calling in for pt.   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  It has not helped the dizziness but he has become depressed and anxious again since being off the Zoloft.   He just wants to sit around and do nothing. 2. DEPRESSION SYMPTOM SCREENING: "How are you feeling overall?" (e.g., decreased energy, increased sleeping or difficulty sleeping, difficulty concentrating, feelings of sadness, guilt, hopelessness, or worthlessness)     Sitting around doing nothing. 3. RISK OF HARM - SUICIDAL IDEATION:  "Do you ever have thoughts of hurting or killing yourself?"  (e.g., yes, no, no but preoccupation with thoughts about death)   - INTENT:  "Do you have thoughts of hurting or killing yourself right NOW?" (e.g., yes, no, N/A)    - PLAN: "Do you have a specific plan for how you would do this?" (e.g., gun, knife, overdose, no plan, N/A)     Wife did not mention. 4. RISK OF HARM - HOMICIDAL IDEATION:  "Do you ever have thoughts of hurting or killing someone else?"  (e.g., yes, no, no but preoccupation with thoughts about death)   - INTENT:  "Do you have thoughts of hurting or killing someone right NOW?" (e.g., yes, no, N/A)   - PLAN: "Do you have a specific plan for how you would do this?" (e.g., gun, knife, no plan, N/A)      Wife did not mention 5. FUNCTIONAL IMPAIRMENT: "How have things been going for you overall in your life? Have you had any more difficulties than usual doing your normal daily activities?"  (e.g., better, same, worse; self-care, school, work, interactions)     *No Answer* 6. SUPPORT: "Who is with you now?" "Who do you live with?" "Do you have family or friends nearby who you can talk to?"      Wife is calling for husband. 7. THERAPIST: "Do you have a counselor or therapist? Name?"     *No Answer* 8. STRESSORS: "Has there been any new stress or recent changes in your life?"     Zoloft discontinued to see if it helped with the dizziness. 9. DRUG ABUSE/ALCOHOL: "Do you drink alcohol or use any illegal drugs?"      *No Answer* 10. OTHER: "Do you have any other health or medical symptoms right now?" (e.g., fever)       Dizziness that Dr. [name] is a ware of.   He has an ENT consult coming up. 11. PREGNANCY: "Is there any chance you are pregnant?" "When was your last menstrual period?"       N/A  Protocols used: DEPRESSION-A-AH, DIZZINESS Heidi Dach

## 2017-09-01 ENCOUNTER — Telehealth: Payer: Self-pay | Admitting: Internal Medicine

## 2017-09-01 NOTE — Telephone Encounter (Signed)
Pt wife states pt oxygen needs to be checked on his CPAP machine. Please call to discuss.

## 2017-09-03 NOTE — Telephone Encounter (Signed)
Please call and evaluate patients needs and clarification

## 2017-09-04 ENCOUNTER — Other Ambulatory Visit: Payer: Self-pay | Admitting: *Deleted

## 2017-09-04 DIAGNOSIS — G4733 Obstructive sleep apnea (adult) (pediatric): Secondary | ICD-10-CM

## 2017-09-04 NOTE — Telephone Encounter (Signed)
Patient and spouse says that ONO on cpap was going to be ordered. Then they stated patient is not using oxygen. Therefore they want an order placed to discontinue oxygen therapy.

## 2017-09-04 NOTE — Telephone Encounter (Signed)
ONO will be ordered.

## 2017-09-04 NOTE — Telephone Encounter (Signed)
1.I will need ONO assessment on CPAP 2.then will decide on need of oxygen therapy

## 2017-09-04 NOTE — Progress Notes (Signed)
error 

## 2017-09-05 ENCOUNTER — Other Ambulatory Visit
Admission: RE | Admit: 2017-09-05 | Discharge: 2017-09-05 | Disposition: A | Discharge status: Home / Self Care | Payer: PPO | Source: Ambulatory Visit | Attending: Nurse Practitioner | Admitting: Nurse Practitioner

## 2017-09-05 ENCOUNTER — Encounter: Payer: Self-pay | Admitting: Nurse Practitioner

## 2017-09-05 ENCOUNTER — Ambulatory Visit
Admission: RE | Admit: 2017-09-05 | Discharge: 2017-09-05 | Disposition: A | Discharge status: Home / Self Care | Payer: PPO | Source: Ambulatory Visit | Attending: Nurse Practitioner | Admitting: Nurse Practitioner

## 2017-09-05 ENCOUNTER — Ambulatory Visit: Payer: PPO | Admitting: Nurse Practitioner

## 2017-09-05 VITALS — BP 160/60 | HR 66 | Ht 68.0 in | Wt 259.5 lb

## 2017-09-05 DIAGNOSIS — I25119 Atherosclerotic heart disease of native coronary artery with unspecified angina pectoris: Secondary | ICD-10-CM | POA: Diagnosis not present

## 2017-09-05 DIAGNOSIS — E785 Hyperlipidemia, unspecified: Secondary | ICD-10-CM | POA: Diagnosis not present

## 2017-09-05 DIAGNOSIS — R0600 Dyspnea, unspecified: Secondary | ICD-10-CM

## 2017-09-05 DIAGNOSIS — I10 Essential (primary) hypertension: Secondary | ICD-10-CM

## 2017-09-05 DIAGNOSIS — R5383 Other fatigue: Secondary | ICD-10-CM

## 2017-09-05 DIAGNOSIS — I509 Heart failure, unspecified: Secondary | ICD-10-CM | POA: Insufficient documentation

## 2017-09-05 DIAGNOSIS — I5032 Chronic diastolic (congestive) heart failure: Secondary | ICD-10-CM | POA: Diagnosis not present

## 2017-09-05 DIAGNOSIS — J90 Pleural effusion, not elsewhere classified: Secondary | ICD-10-CM | POA: Insufficient documentation

## 2017-09-05 DIAGNOSIS — I480 Paroxysmal atrial fibrillation: Secondary | ICD-10-CM

## 2017-09-05 LAB — CBC WITH DIFFERENTIAL/PLATELET
Basophils Absolute: 0 10*3/uL (ref 0–0.1)
Basophils Relative: 1 %
Eosinophils Absolute: 0.1 10*3/uL (ref 0–0.7)
Eosinophils Relative: 3 %
HCT: 39 % — ABNORMAL LOW (ref 40.0–52.0)
Hemoglobin: 13.3 g/dL (ref 13.0–18.0)
Lymphocytes Relative: 17 %
Lymphs Abs: 1 10*3/uL (ref 1.0–3.6)
MCH: 30.7 pg (ref 26.0–34.0)
MCHC: 34 g/dL (ref 32.0–36.0)
MCV: 90.3 fL (ref 80.0–100.0)
Monocytes Absolute: 0.4 10*3/uL (ref 0.2–1.0)
Monocytes Relative: 8 %
Neutro Abs: 4.3 10*3/uL (ref 1.4–6.5)
Neutrophils Relative %: 71 %
Platelets: 90 10*3/uL — ABNORMAL LOW (ref 150–440)
RBC: 4.32 MIL/uL — ABNORMAL LOW (ref 4.40–5.90)
RDW: 14.1 % (ref 11.5–14.5)
WBC: 5.9 10*3/uL (ref 3.8–10.6)

## 2017-09-05 NOTE — Patient Instructions (Signed)
Medication Instructions:  Your physician recommends that you continue on your current medications as directed. Please refer to the Current Medication list given to you today.   Labwork: Your physician recommends that you return for lab work in: TODAY (CBC). - Please go to the [facility]. You will check in at the front desk to the right as you walk into the atrium. Valet Parking is offered if needed.    Testing/Procedures: A chest x-ray takes a picture of the organs and structures inside the chest, including the heart, lungs, and blood vessels. This test can show several things, including, whether the heart is enlarges; whether fluid is building up in the lungs; and whether pacemaker / defibrillator leads are still in place.   Follow-Up: You have been referred to PHYSICAL THERAPY. Someone should call you to schedule.    Your physician recommends that you schedule a follow-up appointment in: [place].  If you need a refill on your cardiac medications before your next appointment, please call your pharmacy.    Chest X-Ray A chest X-ray is a painless test that uses radiation to create images of the structures inside of your chest. Chest X-rays are used to look for many health conditions, including heart failure, pneumonia, tuberculosis, rib fractures, breathing disorders, and cancer. They may be used to diagnose chest pain, constant coughing, or trouble breathing. Tell a health care provider about:  Any allergies you have.  All medicines you are taking, including vitamins, herbs, eye drops, creams, and over-the-counter medicines.  Any surgeries you have had.  Any medical conditions you have.  Whether you are pregnant or may be pregnant. What are the risks? Getting a chest X-ray is a safe procedure. However, you will be exposed to a small amount of radiation. Being exposed to too much radiation over a lifetime can increase the risk of cancer. This risk  is small, but it may occur if you have many X-rays throughout your life. What happens before the procedure?  You may be asked to remove glasses, jewelry, and any other metal objects.  You will be asked to undress from the waist up. You may be given a hospital gown to wear.  You may be asked to wear a protective lead apron to protect parts of your body from radiation. What happens during the procedure?  You will be asked to stand still as each picture is taken to get the best possible images.  You will be asked to take a deep breath and hold your breath for a few seconds.  The X-ray machine will create a picture of your chest using a tiny burst of radiation. This is painless.  More pictures may be taken from other angles. Typically, one picture will be taken while you face the X-ray camera, and another picture will be taken from the side while you stand. If you cannot stand, you may be asked to lie down. The procedure may vary among health care providers and hospitals. What happens after the procedure?  The X-ray(s) will be reviewed by your health care provider or an X-ray (radiology) specialist.  It is up to you to get your test results. Ask your health care provider, or the department that is doing the test, when your results will be ready.  Your health care provider will tell you if you need more tests or a follow-up exam. Keep all follow-up visits as told by your health care provider. This is important. Summary  A chest X-ray is a safe, painless test that is used to examine the inside of the chest, heart, and lungs.  You will need to undress from the waist up and remove jewelry and metal objects before the procedure.  You will be exposed to a small amount of radiation during the procedure.  The X-ray machine will take one or more pictures of your chest while you remain as still as possible.  Later, a health care provider or specialist will review the test results with you. This  information is not intended to replace advice given to you by your health care provider. Make sure you discuss any questions you have with your health care provider. Document Released: 05/31/2016 Document Revised: 05/31/2016 Document Reviewed: 05/31/2016 Elsevier Interactive Patient Education  [employer].

## 2017-09-05 NOTE — Progress Notes (Signed)
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  Past Medical History    Past Medical History:  Diagnosis Date  . Cervical spondylosis 10/01/2013  . Chronic diastolic CHF (congestive heart failure) ([place])    a. 07/2016 Echo: >55%; b. 10/2016 Echo: EF 55-60%, Gr1 DD, Ao sclerosis w/o stenosis, sev dil LA; c. 08/2017 Echo: EF 60-65%, no rwma, Gr2 DD, mild AS, sev dil LA/RA.  [name] Coronary artery disease    a. 1998 s/p mini-cabg @ [facility] - LIMA->LAD;  b. 07/2016 St Echo:  Inadequate HR (max 97) w/ hypertensive response (220/96). Ex time only 2:54 - stopped due to dyspnea and leg pain;  c.  08/2016 MV: EF 67%, no ischemia, low risk; d. 10/2016 NSTEMI/Cath: LM 40, LAD 100ost, RI 80, LCX nl, RCA 95p (4.0x26 Onyx DES), 1m, LIMA->LAD nl, EF 50-55%.  . Depression   . GERD (gastroesophageal reflux disease)   . Hyperlipidemia   . Hypertension   . PAF (paroxysmal atrial fibrillation) ([facility])    a. s/p DCCV-->maintaining sinus on amiodarone;  b. CHA2DS2VASc = 5-->eliquis.  . Parkinson's disease ([place])    tremors  . Pulmonary embolism ([place]) 2011  . 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 Sleep apnea    wears CPAP   Past Surgical History:  Procedure Laterality Date  . BACK SURGERY  1960  . CARDIAC CATHETERIZATION    . CHOLECYSTECTOMY  2010  . CORONARY ARTERY BYPASS GRAFT  01/07/1997  . CORONARY STENT  INTERVENTION N/A 10/31/2016   Procedure: Coronary Stent Intervention;  Surgeon: Wellington Hampshire, MD;  Location: [facility];  Service: Cardiovascular;  Laterality: N/A;  . ELECTROPHYSIOLOGIC STUDY N/A 07/14/2015   Procedure: CARDIOVERSION;  Surgeon: Yolonda Kida, MD;  Location: [facility];  Service: Cardiovascular;  Laterality: N/A;  . ELECTROPHYSIOLOGIC STUDY N/A 10/12/2015   Procedure: CARDIOVERSION;  Surgeon: Minna Merritts, MD;  Location: [facility];  Service: Cardiovascular;  Laterality: N/A;  . LEFT HEART CATH AND CORONARY ANGIOGRAPHY N/A 10/31/2016   Procedure: Left Heart Cath and Coronary Angiography;  Surgeon: Wellington Hampshire, MD;  Location: [place] [facility];  Service: Cardiovascular;  Laterality: N/A;  . OTHER SURGICAL HISTORY  1998   Bypass    Allergies  Allergies  Allergen Reactions  . Pravastatin Other (See Comments)  . Prednisone Other (See Comments)    Pt states that med makes him hyper Pt states that med makes him hyper    History of Present Illness    82 year old male with the above complex past medical history including CAD status post one-vessel bypass in 1998, HFpEF, paroxysmal A. fib on Eliquis, prior pulmonary embolism, interstitial lung disease, Parkinson's disease, COPD, prior tobacco abuse, hypertension, hyperlipidemia, morbid obesity, and sleep apnea.  Last catheterization was performed in July 2018, at which time he was shown to have an occluded native LAD, patent LIMA to the LAD, and severe proximal RCA stenosis with heavy thrombosis and moderate distal left main stenosis with significant stenosis in the  ramus branch.  EF was 50 to 55%.  He underwent successful PCI drug-eluting stent placement to the right coronary artery which is completed by embolization into a large RV branch at the lesion site, which was treated with PTCA and established TIMI I flow.  He says that ever since then, he has had dyspnea on exertion and over the past 10 months, has  required titration of diuretics.  CT of the chest earlier this year showed early pulmonary fibrosis and COPD and as result, amiodarone was discontinued.  He was admitted in April 2019 with acute respiratory failure and hypoxia felt to be secondary to COPD, pneumonia, and pulmonary fibrosis.  He was treated and subsequently discharged on oxygen.  Unfortunately, symptoms never really fully improved and subsequently worsened, prompting readmission on May 11.  He reported right-sided pleuritic chest pain at that time.  Chest x-ray showed mild interstitial edema with patchy consolidation of the right lung base and right pleural effusion.  He was treated with antibiotics and diuresed.  Discharge weight was 257 pounds.  Patient says that since his discharge, he has had daytime somnolence and fatigue.  He just has no energy to do anything.  He has not been sleeping particularly well at night and has been napping during the day.  He is compliant with his CPAP.  He does have some dyspnea on exertion though his biggest complaint is that of just fatigue.  He will occasionally have an episode of chest pressure which is retrosternal in nature and occurring almost exclusively at night.  He has not been having exertional symptoms.  He denies PND, orthopnea, dizziness, syncope, edema, or early satiety.  Home Medications    Prior to Admission medications   Medication Sig Start Date End Date Taking? Authorizing Provider  acetaminophen (TYLENOL) 500 MG tablet Take 500 mg every 6 (six) hours as needed by mouth for mild pain or moderate pain.    Yes [provider]  albuterol (PROVENTIL) (2.5 MG/3ML) 0.083% nebulizer solution Take 3 mLs (2.5 mg total) by nebulization every 4 (four) hours as needed for wheezing or shortness of breath. 08/24/17  Yes Kasa, Maretta Bees, MD  carbidopa-levodopa (SINEMET) 25-250 MG tablet Take 2 tablets by mouth 3 (three) times daily. 02/01/17  Yes Kathrynn Ducking, MD  carvedilol (COREG) 6.25 MG  tablet TAKE ONE TABLET BY MOUTH TWICE DAILY WITH A MEAL 07/24/17  Yes Wellington Hampshire, MD  clopidogrel (PLAVIX) 75 MG tablet TAKE ONE TABLET BY MOUTH EVERY DAY WITH BREAKFAST 07/06/17  Yes Wellington Hampshire, MD  cyclobenzaprine (FLEXERIL) 5 MG tablet Take 5 mg by mouth 3 (three) times daily as needed for muscle spasms.   Yes [provider]  ELIQUIS 5 MG TABS tablet TAKE ONE TABLET TWICE DAILY 02/06/17  Yes [name] A, MD  EPINEPHrine 0.3 mg/0.3 mL IJ SOAJ injection Inject 0.3 mg as directed once as needed (allergic reaction). Reported on 10/27/2015 07/01/13  Yes [provider]  esomeprazole (NEXIUM) 20 MG capsule Take 40 mg by mouth daily at 12 noon.   Yes [provider]  fluticasone (FLONASE) 50 MCG/ACT nasal spray TAKE 2 PUFFS IN EACH NOSTRIL EVERY DAY 10/18/16  Yes Cook, Jayce G, DO  furosemide (LASIX) 20 MG tablet Take 20 mg by mouth daily.   Yes [provider]  HYDROcodone-acetaminophen (NORCO/VICODIN) 5-325 MG tablet Take 1 tablet by mouth daily as needed (neck pain).  06/16/17  Yes [provider]  isosorbide mononitrate (IMDUR) 60 MG 24 hr  tablet Take 1.5 tablets (90 mg total) by mouth daily. 08/28/17  Yes Fritzi Mandes, MD  levothyroxine (SYNTHROID, LEVOTHROID) 50 MCG tablet Take 50 mcg by mouth daily before lunch.  09/07/16  Yes [provider]  losartan (COZAAR) 100 MG tablet Take 1 tablet (100 mg total) by mouth daily. 02/14/17  Yes Wellington Hampshire, MD  mirabegron ER (MYRBETRIQ) 50 MG TB24 tablet Take 50 mg by mouth daily.   Yes [provider]  rosuvastatin (CRESTOR) 10 MG tablet Take 10 mg by mouth at bedtime.   Yes [provider]  VENTOLIN HFA 108 (90 Base) MCG/ACT inhaler TAKE 2 PUFFS EVERY 8 HOURS AS NEEDED FORWHEEZING 12/02/16  Yes Coral Spikes, DO    Review of Systems    Chronic dyspnea and fatigue as outlined above.  He has also been having intermittent chest discomfort, exclusively at night.  He denies  PND, orthopnea, dizziness, syncope, edema, or early satiety.  All other systems reviewed and are otherwise negative except as noted above.  Physical Exam    VS:  BP (!) 160/60 (BP Location: Right Arm, Patient Position: Sitting, Cuff Size: Large)   Pulse 66   Ht 5\' 8"  (1.727 m)   Wt 259 lb 8 oz (117.7 kg)   SpO2 97%   BMI 39.46 kg/m  , BMI Body mass index is 39.46 kg/m. GEN: Obese, in no acute distress.  HEENT: normal.  Neck: Supple, obese, difficult to gauge JVP, no carotid bruits, or masses. Cardiac: RRR, no murmurs, rubs, or gallops. No clubbing, cyanosis, trace ankle  edema.  Radials/PT 2+ and equal bilaterally.  Respiratory:  Respirations regular and unlabored, markedly diminished breath sounds at the right base, otherwise clear to auscultation. GI: Obese, soft, nontender, nondistended, BS + x 4. MS: no deformity or atrophy. Skin: warm and dry, no rash. Neuro:  Strength and sensation are intact. Psych: Normal affect.  Accessory Clinical Findings    ECG -regular sinus rhythm, 66, left axis deviation, inferior infarct, anterolateral ST depression.  No significant acute changes.  Assessment & Plan    1.  HFpEF/acute on chronic respiratory failure with recent pneumonia: Patient has chronic dyspnea in the setting of lung disease and diastolic heart failure.  Recently hospitalized twice in the setting of respiratory failure and pneumonia.  Just discharged last week after hospitalization.  He continues to have some degree of dyspnea on exertion though his biggest complaint is that of fatigue and low energy.  He has not been sleeping well at night.  He is compliant with CPAP.  His volume status appears to be reasonably stable and his weight is only up 2 pounds since his discharge, though he is fully clothed today.  He does have markedly diminished breath sounds on the right.  Given recent infection and evidence of pleural effusion on last chest x-ray, I will follow-up a PA and lateral chest  x-ray and CBC to reevaluate white count which was markedly elevated prior to last admission.  From a heart failure standpoint, I will continue his current dose of Lasix as well as beta-blocker and ARB therapy.  His blood pressure is elevated today though both he and his wife say it has been variable at home and with him feeling so poorly, he would prefer not to change any medications today.  2.  Fatigue: Patient is recovering from 2 separate hospitalizations related to respiratory failure.  He has not had much energy.  Follow-up CBC is secondary to hospitalizations and deconditioning.  He says he has not been walking much because he is worried about falling.  In that setting, I have made a referral for physical therapy to evaluate what he might need at home to get him more active.  3.  Coronary artery disease: He has has had some atypical chest discomfort occurring at night.  He has not had any daytime chest pain or exertional symptoms.  It was previously discussed with him that he may require right and left heart cardiac catheterization if symptoms of dyspnea and/or chest discomfort persist despite ruling out other causes.  We have agreed that we will have him evaluated by physical therapy and an effort to increase his conditioning prior to pursuing any additional ischemic evaluations.  4.  Paroxysmal atrial fibrillation: He is in sinus rhythm and remains on beta-blocker and Eliquis therapy.  5.  COPD/interstitial lung disease: Previous required oxygen following his April admission.  He was not discharged on this last week.  Saturation is 97% today.  6.  Morbid obesity: Surely playing a role in his conditioning.  He is not currently at a point to seriously consider an increase in activity.  7.  Disposition: Follow-up chest x-ray and CBC.  Refer to physical therapy.  Follow-up in clinic in 1 month or sooner if necessary.   Murray Hodgkins, NP 09/05/2017, 5:40 PM

## 2017-09-05 NOTE — H&P (View-Only) (Signed)
Office Visit    Patient Name: Andre [name] Date of Encounter: 09/05/2017  Primary Care Provider:  [name], MD Primary Cardiologist:  [name], MD  Chief Complaint    82 year old male with an extensive past medical history including CAD status post one-vessel CABG in 3154, chronic diastolic congestive heart failure, paroxysmal atrial fibrillation on Eliquis, prior pulmonary embolism, interstitial lung disease, Parkinson's disease, COPD secondary to prior tobacco abuse, hypertension, hyperlipidemia, morbid obesity, and sleep apnea, who presents for follow-up after recent hospitalizations for pneumonia and respiratory failure.  Past Medical History    Past Medical History:  Diagnosis Date  . Cervical spondylosis 10/01/2013  . Chronic diastolic CHF (congestive heart failure) ([place])    a. 07/2016 Echo: >55%; b. 10/2016 Echo: EF 55-60%, Gr1 DD, Ao sclerosis w/o stenosis, sev dil LA; c. 08/2017 Echo: EF 60-65%, no rwma, Gr2 DD, mild AS, sev dil LA/RA.  [name] Coronary artery disease    a. 1998 s/p mini-cabg @ [facility] - LIMA->LAD;  b. 07/2016 St Echo:  Inadequate HR (max 97) w/ hypertensive response (220/96). Ex time only 2:54 - stopped due to dyspnea and leg pain;  c.  08/2016 MV: EF 67%, no ischemia, low risk; d. 10/2016 NSTEMI/Cath: LM 40, LAD 100ost, RI 80, LCX nl, RCA 95p (4.0x26 Onyx DES), 67m, LIMA->LAD nl, EF 50-55%.  . Depression   . GERD (gastroesophageal reflux disease)   . Hyperlipidemia   . Hypertension   . PAF (paroxysmal atrial fibrillation) (HCC)    a. s/p DCCV-->maintaining sinus on amiodarone;  b. CHA2DS2VASc = 5-->eliquis.  . Parkinson's disease ([place])    tremors  . Pulmonary embolism ([place]) 2011  . 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 Sleep apnea    wears CPAP   Past Surgical History:  Procedure Laterality Date  . BACK SURGERY  1960  . CARDIAC CATHETERIZATION    . CHOLECYSTECTOMY  2010  . CORONARY ARTERY BYPASS GRAFT  01/07/1997  . CORONARY STENT  INTERVENTION N/A 10/31/2016   Procedure: Coronary Stent Intervention;  Surgeon: Wellington Hampshire, MD;  Location: [facility];  Service: Cardiovascular;  Laterality: N/A;  . ELECTROPHYSIOLOGIC STUDY N/A 07/14/2015   Procedure: CARDIOVERSION;  Surgeon: Yolonda Kida, MD;  Location: [facility];  Service: Cardiovascular;  Laterality: N/A;  . ELECTROPHYSIOLOGIC STUDY N/A 10/12/2015   Procedure: CARDIOVERSION;  Surgeon: Minna Merritts, MD;  Location: [facility];  Service: Cardiovascular;  Laterality: N/A;  . LEFT HEART CATH AND CORONARY ANGIOGRAPHY N/A 10/31/2016   Procedure: Left Heart Cath and Coronary Angiography;  Surgeon: Wellington Hampshire, MD;  Location: [facility];  Service: Cardiovascular;  Laterality: N/A;  . OTHER SURGICAL HISTORY  1998   Bypass    Allergies  Allergies  Allergen Reactions  . Pravastatin Other (See Comments)  . Prednisone Other (See Comments)    Pt states that med makes him hyper Pt states that med makes him hyper    History of Present Illness    82 year old male with the above complex past medical history including CAD status post one-vessel bypass in 1998, HFpEF, paroxysmal A. fib on Eliquis, prior pulmonary embolism, interstitial lung disease, Parkinson's disease, COPD, prior tobacco abuse, hypertension, hyperlipidemia, morbid obesity, and sleep apnea.  Last catheterization was performed in July 2018, at which time he was shown to have an occluded native LAD, patent LIMA to the LAD, and severe proximal RCA stenosis with heavy thrombosis and moderate distal left main stenosis with significant stenosis in the  ramus branch.  EF was 50 to 55%.  He underwent successful PCI drug-eluting stent placement to the right coronary artery which is completed by embolization into a large RV branch at the lesion site, which was treated with PTCA and established TIMI I flow.  He says that ever since then, he has had dyspnea on exertion and over the past 10 months, has  required titration of diuretics.  CT of the chest earlier this year showed early pulmonary fibrosis and COPD and as result, amiodarone was discontinued.  He was admitted in April 2019 with acute respiratory failure and hypoxia felt to be secondary to COPD, pneumonia, and pulmonary fibrosis.  He was treated and subsequently discharged on oxygen.  Unfortunately, symptoms never really fully improved and subsequently worsened, prompting readmission on May 11.  He reported right-sided pleuritic chest pain at that time.  Chest x-ray showed mild interstitial edema with patchy consolidation of the right lung base and right pleural effusion.  He was treated with antibiotics and diuresed.  Discharge weight was 257 pounds.  Patient says that since his discharge, he has had daytime somnolence and fatigue.  He just has no energy to do anything.  He has not been sleeping particularly well at night and has been napping during the day.  He is compliant with his CPAP.  He does have some dyspnea on exertion though his biggest complaint is that of just fatigue.  He will occasionally have an episode of chest pressure which is retrosternal in nature and occurring almost exclusively at night.  He has not been having exertional symptoms.  He denies PND, orthopnea, dizziness, syncope, edema, or early satiety.  Home Medications    Prior to Admission medications   Medication Sig Start Date End Date Taking? Authorizing Provider  acetaminophen (TYLENOL) 500 MG tablet Take 500 mg every 6 (six) hours as needed by mouth for mild pain or moderate pain.    Yes [provider]  albuterol (PROVENTIL) (2.5 MG/3ML) 0.083% nebulizer solution Take 3 mLs (2.5 mg total) by nebulization every 4 (four) hours as needed for wheezing or shortness of breath. 08/24/17  Yes Kasa, Maretta Bees, MD  carbidopa-levodopa (SINEMET) 25-250 MG tablet Take 2 tablets by mouth 3 (three) times daily. 02/01/17  Yes Kathrynn Ducking, MD  carvedilol (COREG) 6.25 MG  tablet TAKE ONE TABLET BY MOUTH TWICE DAILY WITH A MEAL 07/24/17  Yes Wellington Hampshire, MD  clopidogrel (PLAVIX) 75 MG tablet TAKE ONE TABLET BY MOUTH EVERY DAY WITH BREAKFAST 07/06/17  Yes Wellington Hampshire, MD  cyclobenzaprine (FLEXERIL) 5 MG tablet Take 5 mg by mouth 3 (three) times daily as needed for muscle spasms.   Yes [provider]  ELIQUIS 5 MG TABS tablet TAKE ONE TABLET TWICE DAILY 02/06/17  Yes [name] A, MD  EPINEPHrine 0.3 mg/0.3 mL IJ SOAJ injection Inject 0.3 mg as directed once as needed (allergic reaction). Reported on 10/27/2015 07/01/13  Yes [provider]  esomeprazole (NEXIUM) 20 MG capsule Take 40 mg by mouth daily at 12 noon.   Yes [provider]  fluticasone (FLONASE) 50 MCG/ACT nasal spray TAKE 2 PUFFS IN EACH NOSTRIL EVERY DAY 10/18/16  Yes Cook, Jayce G, DO  furosemide (LASIX) 20 MG tablet Take 20 mg by mouth daily.   Yes [provider]  HYDROcodone-acetaminophen (NORCO/VICODIN) 5-325 MG tablet Take 1 tablet by mouth daily as needed (neck pain).  06/16/17  Yes [provider]  isosorbide mononitrate (IMDUR) 60 MG 24 hr  tablet Take 1.5 tablets (90 mg total) by mouth daily. 08/28/17  Yes Fritzi Mandes, MD  levothyroxine (SYNTHROID, LEVOTHROID) 50 MCG tablet Take 50 mcg by mouth daily before lunch.  09/07/16  Yes [provider]  losartan (COZAAR) 100 MG tablet Take 1 tablet (100 mg total) by mouth daily. 02/14/17  Yes Wellington Hampshire, MD  mirabegron ER (MYRBETRIQ) 50 MG TB24 tablet Take 50 mg by mouth daily.   Yes [provider]  rosuvastatin (CRESTOR) 10 MG tablet Take 10 mg by mouth at bedtime.   Yes [provider]  VENTOLIN HFA 108 (90 Base) MCG/ACT inhaler TAKE 2 PUFFS EVERY 8 HOURS AS NEEDED FORWHEEZING 12/02/16  Yes Coral Spikes, DO    Review of Systems    Chronic dyspnea and fatigue as outlined above.  He has also been having intermittent chest discomfort, exclusively at night.  He denies  PND, orthopnea, dizziness, syncope, edema, or early satiety.  All other systems reviewed and are otherwise negative except as noted above.  Physical Exam    VS:  BP (!) 160/60 (BP Location: Right Arm, Patient Position: Sitting, Cuff Size: Large)   Pulse 66   Ht 5\' 8"  (1.727 m)   Wt 259 lb 8 oz (117.7 kg)   SpO2 97%   BMI 39.46 kg/m  , BMI Body mass index is 39.46 kg/m. GEN: Obese, in no acute distress.  HEENT: normal.  Neck: Supple, obese, difficult to gauge JVP, no carotid bruits, or masses. Cardiac: RRR, no murmurs, rubs, or gallops. No clubbing, cyanosis, trace ankle  edema.  Radials/PT 2+ and equal bilaterally.  Respiratory:  Respirations regular and unlabored, markedly diminished breath sounds at the right base, otherwise clear to auscultation. GI: Obese, soft, nontender, nondistended, BS + x 4. MS: no deformity or atrophy. Skin: warm and dry, no rash. Neuro:  Strength and sensation are intact. Psych: Normal affect.  Accessory Clinical Findings    ECG -regular sinus rhythm, 66, left axis deviation, inferior infarct, anterolateral ST depression.  No significant acute changes.  Assessment & Plan    1.  HFpEF/acute on chronic respiratory failure with recent pneumonia: Patient has chronic dyspnea in the setting of lung disease and diastolic heart failure.  Recently hospitalized twice in the setting of respiratory failure and pneumonia.  Just discharged last week after hospitalization.  He continues to have some degree of dyspnea on exertion though his biggest complaint is that of fatigue and low energy.  He has not been sleeping well at night.  He is compliant with CPAP.  His volume status appears to be reasonably stable and his weight is only up 2 pounds since his discharge, though he is fully clothed today.  He does have markedly diminished breath sounds on the right.  Given recent infection and evidence of pleural effusion on last chest x-ray, I will follow-up a PA and lateral chest  x-ray and CBC to reevaluate white count which was markedly elevated prior to last admission.  From a heart failure standpoint, I will continue his current dose of Lasix as well as beta-blocker and ARB therapy.  His blood pressure is elevated today though both he and his wife say it has been variable at home and with him feeling so poorly, he would prefer not to change any medications today.  2.  Fatigue: Patient is recovering from 2 separate hospitalizations related to respiratory failure.  He has not had much energy.  Follow-up CBC is secondary to hospitalizations and deconditioning.  He says he has not been walking much because he is worried about falling.  In that setting, I have made a referral for physical therapy to evaluate what he might need at home to get him more active.  3.  Coronary artery disease: He has has had some atypical chest discomfort occurring at night.  He has not had any daytime chest pain or exertional symptoms.  It was previously discussed with him that he may require right and left heart cardiac catheterization if symptoms of dyspnea and/or chest discomfort persist despite ruling out other causes.  We have agreed that we will have him evaluated by physical therapy and an effort to increase his conditioning prior to pursuing any additional ischemic evaluations.  4.  Paroxysmal atrial fibrillation: He is in sinus rhythm and remains on beta-blocker and Eliquis therapy.  5.  COPD/interstitial lung disease: Previous required oxygen following his April admission.  He was not discharged on this last week.  Saturation is 97% today.  6.  Morbid obesity: Surely playing a role in his conditioning.  He is not currently at a point to seriously consider an increase in activity.  7.  Disposition: Follow-up chest x-ray and CBC.  Refer to physical therapy.  Follow-up in clinic in 1 month or sooner if necessary.   Murray Hodgkins, NP 09/05/2017, 5:40 PM

## 2017-09-06 ENCOUNTER — Telehealth: Payer: Self-pay | Admitting: Cardiovascular Disease

## 2017-09-06 DIAGNOSIS — I25118 Atherosclerotic heart disease of native coronary artery with other forms of angina pectoris: Secondary | ICD-10-CM

## 2017-09-06 DIAGNOSIS — I480 Paroxysmal atrial fibrillation: Secondary | ICD-10-CM

## 2017-09-06 MED ORDER — FUROSEMIDE 20 MG PO TABS: 40.0000 mg | ORAL_TABLET | Freq: Every day | ORAL | 3 refills | Status: DC

## 2017-09-06 MED ORDER — POTASSIUM CHLORIDE CRYS ER 20 MEQ PO TBCR: 20.0000 meq | EXTENDED_RELEASE_TABLET | Freq: Every day | ORAL | 3 refills | Status: DC

## 2017-09-06 NOTE — Telephone Encounter (Signed)
Patient was in office yesterday for an appointment  Patient is still having trouble breathing and just does not feel well Would like to know what the doctor thinks on whether or not he should go back to the hospital  Please advise

## 2017-09-06 NOTE — Telephone Encounter (Signed)
Pt wife is returning your call.  

## 2017-09-06 NOTE — Telephone Encounter (Signed)
Spoke with patients wife and patient over speaker phone. Reviewed Dr. Tyrell Antonio recommendations and she read back all instructions. Sent in prescriptions and both verbalized understanding of all instructions with no further questions at this time.

## 2017-09-06 NOTE — Telephone Encounter (Signed)
His chest x-ray from yesterday showed increased volume overload.  I recommend increasing furosemide to 40 mg twice daily for 2 days then down to 40 mg once daily.  Add potassium chloride 20 mEq once daily.  Check basic metabolic profile in 1 week. If symptoms do not improve, he will require a right and left cardiac cath.

## 2017-09-06 NOTE — Telephone Encounter (Signed)
Spoke with patients wife per release form and she reports that patient is still having chest pain and a hard time breathing. She wanted to know what they should do. Recommended further evaluation in the ED and she declined that stating that someone should be able to help them. Advised that we do not currently have a provider here in our office. She would like for me to check with provider and let her know that I would route message over to provider but again that she should seek ED evaluation if symptoms persist or worsen. She verbalized understanding with no further questions at this time.

## 2017-09-06 NOTE — Telephone Encounter (Signed)
Patient coming in for office visit tomorrow

## 2017-09-07 ENCOUNTER — Other Ambulatory Visit: Payer: Self-pay | Admitting: *Deleted

## 2017-09-07 ENCOUNTER — Ambulatory Visit (INDEPENDENT_AMBULATORY_CARE_PROVIDER_SITE_OTHER): Payer: PPO | Admitting: Family Medicine

## 2017-09-07 ENCOUNTER — Encounter: Payer: Self-pay | Admitting: Family Medicine

## 2017-09-07 VITALS — BP 130/72 | HR 66 | Temp 98.3°F | Wt 259.4 lb

## 2017-09-07 DIAGNOSIS — J9601 Acute respiratory failure with hypoxia: Secondary | ICD-10-CM | POA: Diagnosis not present

## 2017-09-07 DIAGNOSIS — I5032 Chronic diastolic (congestive) heart failure: Secondary | ICD-10-CM

## 2017-09-07 DIAGNOSIS — F329 Major depressive disorder, single episode, unspecified: Secondary | ICD-10-CM

## 2017-09-07 DIAGNOSIS — I5033 Acute on chronic diastolic (congestive) heart failure: Secondary | ICD-10-CM

## 2017-09-07 DIAGNOSIS — F419 Anxiety disorder, unspecified: Secondary | ICD-10-CM

## 2017-09-07 DIAGNOSIS — D696 Thrombocytopenia, unspecified: Secondary | ICD-10-CM | POA: Diagnosis not present

## 2017-09-07 DIAGNOSIS — F32A Depression, unspecified: Secondary | ICD-10-CM

## 2017-09-07 DIAGNOSIS — R0902 Hypoxemia: Secondary | ICD-10-CM | POA: Diagnosis not present

## 2017-09-07 DIAGNOSIS — J449 Chronic obstructive pulmonary disease, unspecified: Secondary | ICD-10-CM | POA: Diagnosis not present

## 2017-09-07 NOTE — Progress Notes (Signed)
[name], MD Phone: [phone]  Andre [name] is a 82 y.o. male who presents today for hospital follow-up.  Patient presents for hospital follow-up.  He was hospitalized from 08/26/17-08/28/17 for acute hypoxic respiratory failure in the setting of CHF with acute on chronic diastolic heart failure as well as possible resolving pneumonia in the right lower lobe.  He was placed on antibiotics and treated with IV Lasix.  Symptoms improved.  Diuresed well.  Found to have some atypical chest pain in the hospital as well and was seen by cardiology.  It appears they increased his Imdur.  He had follow-up with cardiology 2 days ago and had a chest x-ray that did still revealed some pulmonary edema and a pleural effusion on the right.  He had slight worsening of shortness of breath and chest pain yesterday and was advised to go to the ED though they deferred this and cardiology recommended increasing his Lasix which he has done and his symptoms have improved.  He has had no additional chest pain.  His breathing has improved with increased diuresis from the increased Lasix.  He has had no fevers.  He has had no wheezing.  Overall feels better than he has in some time.  Noted to have low platelets on labs through cardiology.  Patient did have repeat chest x-ray through cardiology which revealed slight improvement in pulmonary edema.  He had a small right pleural effusion.  He also had bibasilar atelectasis.  Discharge summary and medications reviewed.  Patient has felt somewhat depressed for some time.  He was placed on Zoloft previously though he felt this was contributing to his dizziness and he discontinued this.  His dizziness is better since coming off of this.  He is not sure that the Zoloft helped.  No SI.  He is interested in going back on alternative medication.  Social History   Tobacco Use  Smoking Status Former Smoker  . Packs/day: 1.00  . Years: 40.00  . Pack years: 40.00  . Types:  Cigarettes, Pipe, Cigars  . Last attempt to quit: 04/18/1972  . Years since quitting: 45.4  Smokeless Tobacco Former Systems developer  . Types: Chew  . Quit date: 04/18/1972     ROS see history of present illness  Objective  Physical Exam Vitals:   09/07/17 1126  BP: 130/72  Pulse: 66  Temp: 98.3 F (36.8 C)  SpO2: 95%    BP Readings from Last 3 Encounters:  09/07/17 130/72  09/05/17 (!) 160/60  08/28/17 133/60   Wt Readings from Last 3 Encounters:  09/07/17 259 lb 6.4 oz (117.7 kg)  09/05/17 259 lb 8 oz (117.7 kg)  08/28/17 257 lb 4.4 oz (116.7 kg)    Physical Exam  Constitutional: No distress.  Cardiovascular: Normal rate, regular rhythm and normal heart sounds.  Pulmonary/Chest: Effort normal.  Initially on exam slight crackles noted in left lower lung field slightly greater than right lower lung field though on reexam these had resolved, no wheezing  Musculoskeletal: He exhibits edema (Trace pitting edema bilateral lower extremities to lower shin).  Neurological: He is alert.  Skin: Skin is warm and dry. He is not diaphoretic.     Assessment/Plan: Please see individual problem list.  (HFpEF) heart failure with preserved ejection fraction [facility]) Patient with exacerbation of diastolic heart failure recently.  Appeared to have some re-accumulation of fluid yesterday that has been treated as an outpatient by cardiology with increased Lasix dosing.  He feels improved today.  Asymptomatic  at this time.  He will continue with Lasix dosing through cardiology.  I reinforce that he should have his labs done next week as planned through cardiology.  He is given return precautions.  Acute respiratory failure (HCC) Multifactorial though most likely related to diastolic heart failure.  Pneumonia could have been playing a role as well.  He was treated with antibiotics and Lasix.  He has been improving.  Repeat chest x-ray was done through cardiology.  I suspect the crackles are heard initially  were related to atelectasis as they cleared quickly.  He will monitor for recurrence of symptoms.  Given return precautions.  Anxiety and depression Continues to have some issues with this.  Did not tolerate Zoloft.  We will check with our clinical pharmacist to determine the safest option for him for treatment.  Thrombocytopenia ([place]) Has trended down slightly recently.  This could be related to his recent infection.  We will plan to recheck with his cardiology labs next week.   Orders Placed This Encounter  Procedures  . CBC w/Diff    Standing Status:   Future    Standing Expiration Date:   09/08/2018    No orders of the defined types were placed in this encounter.    [name], MD [place]

## 2017-09-07 NOTE — Assessment & Plan Note (Signed)
Has trended down slightly recently.  This could be related to his recent infection.  We will plan to recheck with his cardiology labs next week.

## 2017-09-07 NOTE — Assessment & Plan Note (Signed)
Multifactorial though most likely related to diastolic heart failure.  Pneumonia could have been playing a role as well.  He was treated with antibiotics and Lasix.  He has been improving.  Repeat chest x-ray was done through cardiology.  I suspect the crackles are heard initially were related to atelectasis as they cleared quickly.  He will monitor for recurrence of symptoms.  Given return precautions.

## 2017-09-07 NOTE — Patient Instructions (Signed)
Nice to see you. Glad you are feeling better. Please follow the directions for your Lasix from cardiology. Please monitor your swelling and breathing. If you develop worsening shortness of breath or chest pain please go to the emergency room. I will check with our clinical pharmacist regarding medications for depression and anxiety for you.  We will contact you when we hear back from her. If you develop thoughts of harming yourself please go to the emergency room.

## 2017-09-07 NOTE — Assessment & Plan Note (Signed)
Patient with exacerbation of diastolic heart failure recently.  Appeared to have some re-accumulation of fluid yesterday that has been treated as an outpatient by cardiology with increased Lasix dosing.  He feels improved today.  Asymptomatic at this time.  He will continue with Lasix dosing through cardiology.  I reinforce that he should have his labs done next week as planned through cardiology.  He is given return precautions.

## 2017-09-07 NOTE — Assessment & Plan Note (Signed)
Continues to have some issues with this.  Did not tolerate Zoloft.  We will check with our clinical pharmacist to determine the safest option for him for treatment.

## 2017-09-08 ENCOUNTER — Ambulatory Visit: Payer: PPO

## 2017-09-08 ENCOUNTER — Ambulatory Visit (INDEPENDENT_AMBULATORY_CARE_PROVIDER_SITE_OTHER): Payer: PPO | Admitting: *Deleted

## 2017-09-08 ENCOUNTER — Encounter: Payer: Self-pay | Admitting: Internal Medicine

## 2017-09-08 DIAGNOSIS — J849 Interstitial pulmonary disease, unspecified: Secondary | ICD-10-CM

## 2017-09-08 DIAGNOSIS — G4733 Obstructive sleep apnea (adult) (pediatric): Secondary | ICD-10-CM

## 2017-09-08 DIAGNOSIS — R0602 Shortness of breath: Secondary | ICD-10-CM

## 2017-09-08 NOTE — Progress Notes (Signed)
SIX MIN WALK 09/08/2017 09/08/2017 05/26/2017 08/31/2016 08/31/2016 09/04/2012  Medications - All AM meds - - Carbidopa-levodopa x2 -  Supplimental Oxygen during Test? (L/min) Yes No No - No No  O2 Flow Rate 2 - - - - -  Type Continuous - - - - -  Laps - 6 - - 7 -  Partial Lap (in Meters) - 12 - - 0 -  Baseline BP (sitting) - 122/80 - - 170/80 -  Baseline Heartrate - 64 - - 77 -  Baseline Dyspnea (Borg Scale) - 2 - - 0 -  Baseline Fatigue (Borg Scale) - 4 - - 3 -  Baseline SPO2 - 96 - - 98 -  BP (sitting) - 130/82 - - 180/80 -  Heartrate - 79 - - 79 -  Dyspnea (Borg Scale) - 4 - - 1 -  Fatigue (Borg Scale) - 5 - - 2 -  SPO2 - 85 - - 93 -  BP (sitting) 126/80 - - - 180/79 -  Heartrate 77 - - - 72 -  SPO2 96 - - - 96 -  Stopped or Paused before Six Minutes No Yes - - No -  Other Symptoms at end of Exercise - Pt stopped with 1:27minutes left due to low O2 sats. Pt placed on O2 2lpm continuous flow and sats of 96%- Pt sent home on [facility] O2 tank as he has O2 at home.  - - - -  Distance Completed - 300 - - 336 -  Tech Comments: - - - no desaturation Patient walked at brisk pace with no complaints of sob,chest tightness or wheeze. Patient was able to hold conversation entire walk. -

## 2017-09-11 DIAGNOSIS — J45909 Unspecified asthma, uncomplicated: Secondary | ICD-10-CM | POA: Diagnosis not present

## 2017-09-11 DIAGNOSIS — J441 Chronic obstructive pulmonary disease with (acute) exacerbation: Secondary | ICD-10-CM | POA: Diagnosis not present

## 2017-09-11 DIAGNOSIS — G4733 Obstructive sleep apnea (adult) (pediatric): Secondary | ICD-10-CM | POA: Diagnosis not present

## 2017-09-12 ENCOUNTER — Telehealth: Payer: Self-pay | Admitting: Family Medicine

## 2017-09-12 ENCOUNTER — Other Ambulatory Visit
Admission: RE | Admit: 2017-09-12 | Discharge: 2017-09-12 | Disposition: A | Discharge status: Home / Self Care | Payer: PPO | Source: Ambulatory Visit | Attending: Cardiovascular Disease | Admitting: Cardiovascular Disease

## 2017-09-12 ENCOUNTER — Other Ambulatory Visit: Payer: Self-pay | Admitting: *Deleted

## 2017-09-12 DIAGNOSIS — D696 Thrombocytopenia, unspecified: Secondary | ICD-10-CM | POA: Diagnosis not present

## 2017-09-12 DIAGNOSIS — I25118 Atherosclerotic heart disease of native coronary artery with other forms of angina pectoris: Secondary | ICD-10-CM | POA: Insufficient documentation

## 2017-09-12 DIAGNOSIS — I480 Paroxysmal atrial fibrillation: Secondary | ICD-10-CM | POA: Insufficient documentation

## 2017-09-12 LAB — CBC WITH DIFFERENTIAL/PLATELET
Basophils Absolute: 0 10*3/uL (ref 0–0.1)
Basophils Relative: 1 %
Eosinophils Absolute: 0.3 10*3/uL (ref 0–0.7)
Eosinophils Relative: 4 %
HCT: 41.2 % (ref 40.0–52.0)
Hemoglobin: 13.7 g/dL (ref 13.0–18.0)
Lymphocytes Relative: 22 %
Lymphs Abs: 1.4 10*3/uL (ref 1.0–3.6)
MCH: 30.3 pg (ref 26.0–34.0)
MCHC: 33.4 g/dL (ref 32.0–36.0)
MCV: 90.9 fL (ref 80.0–100.0)
Monocytes Absolute: 0.9 10*3/uL (ref 0.2–1.0)
Monocytes Relative: 13 %
Neutro Abs: 4 10*3/uL (ref 1.4–6.5)
Neutrophils Relative %: 60 %
Platelets: 128 10*3/uL — ABNORMAL LOW (ref 150–440)
RBC: 4.53 MIL/uL (ref 4.40–5.90)
RDW: 14.5 % (ref 11.5–14.5)
WBC: 6.6 10*3/uL (ref 3.8–10.6)

## 2017-09-12 LAB — BASIC METABOLIC PANEL
Anion gap: 10 (ref 5–15)
BUN: 15 mg/dL (ref 6–20)
CO2: 27 mmol/L (ref 22–32)
Calcium: 8.3 mg/dL — ABNORMAL LOW (ref 8.9–10.3)
Chloride: 99 mmol/L — ABNORMAL LOW (ref 101–111)
Creatinine, Ser: 0.8 mg/dL (ref 0.61–1.24)
GFR calc Af Amer: >60 mL/min (ref >60)
GFR calc non Af Amer: >60 mL/min (ref >60)
Glucose, Bld: 129 mg/dL — ABNORMAL HIGH (ref 65–99)
Potassium: 3.6 mmol/L (ref 3.5–5.1)
Sodium: 136 mmol/L (ref 135–145)

## 2017-09-12 MED ORDER — ESCITALOPRAM OXALATE 10 MG PO TABS: 10.0000 mg | ORAL_TABLET | Freq: Every day | ORAL | 1 refills | Status: DC

## 2017-09-12 NOTE — Telephone Encounter (Signed)
Returned call to spouse she states Chelsea contacted them on yesterday and was confused about recent orders. She wanted to know when they could get hose to bled 02 into cpap machine.

## 2017-09-12 NOTE — Telephone Encounter (Signed)
Patient continues to have sob .  Wife calling to make sure its ok to have labs drawn today.  Please call to discuss.

## 2017-09-12 NOTE — Telephone Encounter (Signed)
Spoke with patients wife per release form and she wanted to know if they could go get labs done today. Advised that should be fine and that we will watch for those results. She reports that he is still short of breath and just not feeling well. Reviewed signs and symptoms to monitor for that would require evaluation in the ED.  She verbalized understanding with no further questions at this time.

## 2017-09-12 NOTE — Telephone Encounter (Signed)
Please let the patient know that I heard back from the pharmacist and she felt lexapro would be ok for him to take for his anxiety. I sent this to his pharmacy. He should follow-up in 2-3 months if he is not already scheduled. Thanks.

## 2017-09-12 NOTE — Telephone Encounter (Signed)
-----   Message from Carlean Jews, [facility] sent at 09/12/2017  4:58 PM EDT ----- Marykay Lex, I think that's a reasonable option. I don't see any reasons to avoid SSRI/SNRI in him, last QT 405 in 2018. No significant DDIs.   Chrys Racer ----- Message ----- From: [name], MD Sent: 09/07/2017   1:21 PM To: Carlean Jews, [place],   This patient has anxiety and depression. He did not tolerate zoloft due to dizziness that may have been related to the zoloft. Can you look at his comorbidities and let me know if lexapro would be an adequate option for him? Thanks.   [name]

## 2017-09-12 NOTE — Telephone Encounter (Signed)
Patient wife calling to check the status of supplies needed to hook up machine at home    Patient still sob .  Please call to discuss need for another ov.

## 2017-09-13 NOTE — Telephone Encounter (Signed)
Left message to return call, ok for pec to speak to patient 

## 2017-09-13 NOTE — Telephone Encounter (Signed)
Patient wife calling to get an answer about sob

## 2017-09-13 NOTE — Telephone Encounter (Signed)
Left message to return call, ok for pec to speak to patient  Please also see result note

## 2017-09-14 ENCOUNTER — Telehealth: Payer: Self-pay | Admitting: *Deleted

## 2017-09-14 DIAGNOSIS — J849 Interstitial pulmonary disease, unspecified: Secondary | ICD-10-CM

## 2017-09-14 DIAGNOSIS — G4733 Obstructive sleep apnea (adult) (pediatric): Secondary | ICD-10-CM

## 2017-09-14 NOTE — Telephone Encounter (Signed)
Patient wife calling to check on status of what Dr. Fletcher Anon recommends Please contact on cell

## 2017-09-14 NOTE — Telephone Encounter (Signed)
Called patient and spouse and made aware of ONO results He needs 2 liters of 02 bled into cpap. Pt received converter on yesterday to bleed 02 into cpap. Nothing further needed.

## 2017-09-14 NOTE — Telephone Encounter (Signed)
Spoke with patient and wife over speaker and reviewed Dr. Tyrell Antonio information. Advised that I would check with him on when we can get him in for both and give them a call back tomorrow with all information. She was appreciative for the call back and had no further questions at this time.

## 2017-09-14 NOTE — Telephone Encounter (Signed)
His shortness of breath could be related to his lung disease, his heart or his obesity.  It is hard to know for sure but if he still feels very limited, then I recommend proceeding with a right and left cardiac cath to further evaluate this.

## 2017-09-14 NOTE — Telephone Encounter (Addendum)
I had spoken with the patient earlier today with Dr. Tyrell Antonio recommendations.  The patient and his wife are agreeable to a right and left heart cath with Dr. Fletcher Anon. I advised I will try to schedule him for Monday 09/18/17 with Dr. Fletcher Anon and call him back. The patient and his wife are agreeable and voice understanding.   Right/ Left heart cath scheduled for Monday 09/18/17 with Dr. Fletcher Anon at 9:30 am.

## 2017-09-14 NOTE — Telephone Encounter (Signed)
Dr. Fletcher Anon,  Do we just need to go ahead and set the patient up for a right and left heart cath with you?

## 2017-09-15 ENCOUNTER — Encounter: Payer: Self-pay | Admitting: *Deleted

## 2017-09-15 NOTE — Telephone Encounter (Signed)
I spoke with the patient and his wife regarding his heart cath scheduled for Monday 09/18/17. The following instructions were given:  [facility] Cardiac Cath Instructions   You are scheduled for a Cardiac Cath on: Monday 09/18/17  Please arrive at 8:30 am on the day of your procedure  Please expect a call from our [facility] to pre-register you  Do not eat any solid food after midnight, but you may have clear liquids up until 5 am the morning of your procedure  Someone will need to drive you home  It is recommended someone be with you for the first 24 hours after your procedure  Wear clothes that are easy to get on/off and wear slip on shoes if possible   Medications bring a current list of all medications with you  _x__ You may take all of your medications the morning of your procedure with enough water to swallow safely unless listed below:  - hold eliquis for 2 days prior to procedure - hold lasix (furosemide) the morning of your procedure    The patient's wife asks if the patient could be admitted until Monday due to his SOB.  I advised that we could not direct admit him, but if he felt that his breathing was deteriorating that he could proceed to the ER for further evaluation and treatment. Per the patient, if he has his oxygen, he feels ok. They are aware and agreeable with going to the ER if the patient's breathing worsens.

## 2017-09-15 NOTE — Telephone Encounter (Signed)
I left a message for the patient or his wife to call back at both the home & her cell # regarding the patient's heart cath scheduled for Monday 09/18/17.

## 2017-09-18 ENCOUNTER — Encounter
Admission: RE | Disposition: A | Discharge status: Home / Self Care | Payer: Self-pay | Source: Ambulatory Visit | Attending: Cardiovascular Disease

## 2017-09-18 ENCOUNTER — Ambulatory Visit (HOSPITAL_COMMUNITY)
Admission: RE | Admit: 2017-09-18 | Discharge: 2017-09-18 | Disposition: A | Discharge status: Home / Self Care | Payer: PPO | Source: Ambulatory Visit | Attending: Cardiovascular Disease | Admitting: Cardiovascular Disease

## 2017-09-18 ENCOUNTER — Encounter: Payer: Self-pay | Admitting: Emergency Medicine

## 2017-09-18 ENCOUNTER — Telehealth: Payer: Self-pay

## 2017-09-18 ENCOUNTER — Other Ambulatory Visit: Payer: Self-pay

## 2017-09-18 ENCOUNTER — Emergency Department: Payer: PPO

## 2017-09-18 ENCOUNTER — Inpatient Hospital Stay
Admission: EM | Admit: 2017-09-18 | Discharge: 2017-09-21 | DRG: 196 | Disposition: A | Discharge status: Home / Self Care | Payer: PPO | Attending: Specialist | Admitting: Specialist

## 2017-09-18 DIAGNOSIS — Z955 Presence of coronary angioplasty implant and graft: Secondary | ICD-10-CM

## 2017-09-18 DIAGNOSIS — J90 Pleural effusion, not elsewhere classified: Secondary | ICD-10-CM

## 2017-09-18 DIAGNOSIS — J8410 Pulmonary fibrosis, unspecified: Secondary | ICD-10-CM

## 2017-09-18 DIAGNOSIS — N3281 Overactive bladder: Secondary | ICD-10-CM | POA: Diagnosis not present

## 2017-09-18 DIAGNOSIS — G20 Parkinson's disease: Secondary | ICD-10-CM

## 2017-09-18 DIAGNOSIS — R5381 Other malaise: Secondary | ICD-10-CM | POA: Diagnosis not present

## 2017-09-18 DIAGNOSIS — Z951 Presence of aortocoronary bypass graft: Secondary | ICD-10-CM

## 2017-09-18 DIAGNOSIS — R0602 Shortness of breath: Secondary | ICD-10-CM

## 2017-09-18 DIAGNOSIS — I110 Hypertensive heart disease with heart failure: Secondary | ICD-10-CM

## 2017-09-18 DIAGNOSIS — Z6839 Body mass index (BMI) 39.0-39.9, adult: Secondary | ICD-10-CM

## 2017-09-18 DIAGNOSIS — E785 Hyperlipidemia, unspecified: Secondary | ICD-10-CM

## 2017-09-18 DIAGNOSIS — Z7951 Long term (current) use of inhaled steroids: Secondary | ICD-10-CM

## 2017-09-18 DIAGNOSIS — J9621 Acute and chronic respiratory failure with hypoxia: Secondary | ICD-10-CM | POA: Diagnosis present

## 2017-09-18 DIAGNOSIS — G4733 Obstructive sleep apnea (adult) (pediatric): Secondary | ICD-10-CM | POA: Diagnosis present

## 2017-09-18 DIAGNOSIS — I5032 Chronic diastolic (congestive) heart failure: Secondary | ICD-10-CM | POA: Insufficient documentation

## 2017-09-18 DIAGNOSIS — Z9889 Other specified postprocedural states: Secondary | ICD-10-CM | POA: Insufficient documentation

## 2017-09-18 DIAGNOSIS — J918 Pleural effusion in other conditions classified elsewhere: Secondary | ICD-10-CM | POA: Diagnosis not present

## 2017-09-18 DIAGNOSIS — I480 Paroxysmal atrial fibrillation: Secondary | ICD-10-CM | POA: Diagnosis not present

## 2017-09-18 DIAGNOSIS — F329 Major depressive disorder, single episode, unspecified: Secondary | ICD-10-CM | POA: Diagnosis present

## 2017-09-18 DIAGNOSIS — J9611 Chronic respiratory failure with hypoxia: Secondary | ICD-10-CM | POA: Diagnosis present

## 2017-09-18 DIAGNOSIS — J440 Chronic obstructive pulmonary disease with acute lower respiratory infection: Secondary | ICD-10-CM | POA: Insufficient documentation

## 2017-09-18 DIAGNOSIS — Z79899 Other long term (current) drug therapy: Secondary | ICD-10-CM | POA: Insufficient documentation

## 2017-09-18 DIAGNOSIS — I25118 Atherosclerotic heart disease of native coronary artery with other forms of angina pectoris: Secondary | ICD-10-CM | POA: Diagnosis not present

## 2017-09-18 DIAGNOSIS — J449 Chronic obstructive pulmonary disease, unspecified: Secondary | ICD-10-CM | POA: Diagnosis present

## 2017-09-18 DIAGNOSIS — I2510 Atherosclerotic heart disease of native coronary artery without angina pectoris: Secondary | ICD-10-CM | POA: Diagnosis present

## 2017-09-18 DIAGNOSIS — Z9989 Dependence on other enabling machines and devices: Secondary | ICD-10-CM

## 2017-09-18 DIAGNOSIS — Z7901 Long term (current) use of anticoagulants: Secondary | ICD-10-CM | POA: Insufficient documentation

## 2017-09-18 DIAGNOSIS — Z6838 Body mass index (BMI) 38.0-38.9, adult: Secondary | ICD-10-CM | POA: Diagnosis not present

## 2017-09-18 DIAGNOSIS — Z888 Allergy status to other drugs, medicaments and biological substances status: Secondary | ICD-10-CM

## 2017-09-18 DIAGNOSIS — R0603 Acute respiratory distress: Secondary | ICD-10-CM | POA: Diagnosis not present

## 2017-09-18 DIAGNOSIS — K219 Gastro-esophageal reflux disease without esophagitis: Secondary | ICD-10-CM | POA: Diagnosis present

## 2017-09-18 DIAGNOSIS — I272 Pulmonary hypertension, unspecified: Secondary | ICD-10-CM | POA: Diagnosis present

## 2017-09-18 DIAGNOSIS — J441 Chronic obstructive pulmonary disease with (acute) exacerbation: Secondary | ICD-10-CM | POA: Diagnosis not present

## 2017-09-18 DIAGNOSIS — Z87891 Personal history of nicotine dependence: Secondary | ICD-10-CM | POA: Diagnosis not present

## 2017-09-18 DIAGNOSIS — E876 Hypokalemia: Secondary | ICD-10-CM | POA: Diagnosis not present

## 2017-09-18 DIAGNOSIS — J61 Pneumoconiosis due to asbestos and other mineral fibers: Secondary | ICD-10-CM | POA: Diagnosis not present

## 2017-09-18 DIAGNOSIS — Z7902 Long term (current) use of antithrombotics/antiplatelets: Secondary | ICD-10-CM | POA: Diagnosis not present

## 2017-09-18 DIAGNOSIS — R0600 Dyspnea, unspecified: Secondary | ICD-10-CM

## 2017-09-18 DIAGNOSIS — I5033 Acute on chronic diastolic (congestive) heart failure: Secondary | ICD-10-CM | POA: Diagnosis present

## 2017-09-18 DIAGNOSIS — Z86711 Personal history of pulmonary embolism: Secondary | ICD-10-CM | POA: Insufficient documentation

## 2017-09-18 DIAGNOSIS — E039 Hypothyroidism, unspecified: Secondary | ICD-10-CM | POA: Diagnosis not present

## 2017-09-18 DIAGNOSIS — G4730 Sleep apnea, unspecified: Secondary | ICD-10-CM

## 2017-09-18 DIAGNOSIS — Z9049 Acquired absence of other specified parts of digestive tract: Secondary | ICD-10-CM | POA: Insufficient documentation

## 2017-09-18 DIAGNOSIS — J9601 Acute respiratory failure with hypoxia: Secondary | ICD-10-CM | POA: Diagnosis not present

## 2017-09-18 HISTORY — PX: RIGHT/LEFT HEART CATH AND CORONARY ANGIOGRAPHY: CATH118266

## 2017-09-18 LAB — CBC
HCT: 40.8 % (ref 40.0–52.0)
Hemoglobin: 13.5 g/dL (ref 13.0–18.0)
MCH: 30.2 pg (ref 26.0–34.0)
MCHC: 33 g/dL (ref 32.0–36.0)
MCV: 91.4 fL (ref 80.0–100.0)
Platelets: 183 10*3/uL (ref 150–440)
RBC: 4.47 MIL/uL (ref 4.40–5.90)
RDW: 15.1 % — ABNORMAL HIGH (ref 11.5–14.5)
WBC: 8.2 10*3/uL (ref 3.8–10.6)

## 2017-09-18 LAB — BASIC METABOLIC PANEL
Anion gap: 13 (ref 5–15)
BUN: 9 mg/dL (ref 6–20)
CO2: 25 mmol/L (ref 22–32)
Calcium: 8.7 mg/dL — ABNORMAL LOW (ref 8.9–10.3)
Chloride: 100 mmol/L — ABNORMAL LOW (ref 101–111)
Creatinine, Ser: 0.85 mg/dL (ref 0.61–1.24)
GFR calc Af Amer: >60 mL/min (ref >60)
GFR calc non Af Amer: >60 mL/min (ref >60)
Glucose, Bld: 125 mg/dL — ABNORMAL HIGH (ref 65–99)
Potassium: 3.6 mmol/L (ref 3.5–5.1)
Sodium: 138 mmol/L (ref 135–145)

## 2017-09-18 SURGERY — RIGHT/LEFT HEART CATH AND CORONARY ANGIOGRAPHY
Anesthesia: Moderate Sedation

## 2017-09-18 MED ORDER — ONDANSETRON HCL 4 MG/2ML IJ SOLN: 4.0000 mg | Freq: Four times a day (QID) | INTRAMUSCULAR | Status: DC | PRN

## 2017-09-18 MED ORDER — FUROSEMIDE 10 MG/ML IJ SOLN
INTRAMUSCULAR | Status: AC
  Filled 2017-09-18: qty 2

## 2017-09-18 MED ORDER — ACETAMINOPHEN 650 MG RE SUPP: 650.0000 mg | Freq: Four times a day (QID) | RECTAL | Status: DC | PRN

## 2017-09-18 MED ORDER — FENTANYL CITRATE (PF) 100 MCG/2ML IJ SOLN
INTRAMUSCULAR | Status: DC | PRN
  Administered 2017-09-18: 25 ug via INTRAVENOUS

## 2017-09-18 MED ORDER — HYDROCODONE-ACETAMINOPHEN 5-325 MG PO TABS: 1.0000 | ORAL_TABLET | Freq: Every day | ORAL | Status: DC | PRN

## 2017-09-18 MED ORDER — VERAPAMIL HCL 2.5 MG/ML IV SOLN
INTRAVENOUS | Status: AC
  Filled 2017-09-18: qty 2

## 2017-09-18 MED ORDER — HYDRALAZINE HCL 20 MG/ML IJ SOLN
INTRAMUSCULAR | Status: DC | PRN
  Administered 2017-09-18: 10 mg via INTRAVENOUS

## 2017-09-18 MED ORDER — SODIUM CHLORIDE 0.9% FLUSH
3.0000 mL | Freq: Two times a day (BID) | INTRAVENOUS | Status: DC
  Administered 2017-09-18 – 2017-09-20 (×3): 3 mL via INTRAVENOUS

## 2017-09-18 MED ORDER — IPRATROPIUM-ALBUTEROL 0.5-2.5 (3) MG/3ML IN SOLN
RESPIRATORY_TRACT | Status: AC
  Administered 2017-09-18: 3 mL
  Filled 2017-09-18: qty 3

## 2017-09-18 MED ORDER — FUROSEMIDE 10 MG/ML IJ SOLN
60.0000 mg | Freq: Two times a day (BID) | INTRAMUSCULAR | Status: DC
  Administered 2017-09-18 – 2017-09-20 (×4): 60 mg via INTRAVENOUS
  Filled 2017-09-18 (×4): qty 6

## 2017-09-18 MED ORDER — HYDRALAZINE HCL 20 MG/ML IJ SOLN
INTRAMUSCULAR | Status: AC
  Filled 2017-09-18: qty 1

## 2017-09-18 MED ORDER — MIRABEGRON ER 50 MG PO TB24
50.0000 mg | ORAL_TABLET | Freq: Every day | ORAL | Status: DC
  Administered 2017-09-18 – 2017-09-21 (×4): 50 mg via ORAL
  Filled 2017-09-18 (×4): qty 1

## 2017-09-18 MED ORDER — FUROSEMIDE 10 MG/ML IJ SOLN
20.0000 mg | Freq: Once | INTRAMUSCULAR | Status: AC
  Administered 2017-09-18: 20 mg via INTRAVENOUS

## 2017-09-18 MED ORDER — IPRATROPIUM-ALBUTEROL 0.5-2.5 (3) MG/3ML IN SOLN
RESPIRATORY_TRACT | Status: AC
  Filled 2017-09-18: qty 3

## 2017-09-18 MED ORDER — SODIUM CHLORIDE 0.9 % IV SOLN: INTRAVENOUS | Status: DC

## 2017-09-18 MED ORDER — SODIUM CHLORIDE 0.9% FLUSH: 3.0000 mL | INTRAVENOUS | Status: DC | PRN

## 2017-09-18 MED ORDER — LIDOCAINE HCL (PF) 1 % IJ SOLN
INTRAMUSCULAR | Status: DC | PRN
  Administered 2017-09-18: 18 mL via INTRADERMAL

## 2017-09-18 MED ORDER — SODIUM CHLORIDE 0.9% FLUSH
3.0000 mL | Freq: Two times a day (BID) | INTRAVENOUS | Status: DC
  Administered 2017-09-18 – 2017-09-20 (×5): 3 mL via INTRAVENOUS

## 2017-09-18 MED ORDER — ALBUTEROL SULFATE (2.5 MG/3ML) 0.083% IN NEBU: 2.5000 mg | INHALATION_SOLUTION | Freq: Once | RESPIRATORY_TRACT | Status: DC

## 2017-09-18 MED ORDER — APIXABAN 5 MG PO TABS: 5.0000 mg | ORAL_TABLET | Freq: Two times a day (BID) | ORAL | Status: DC

## 2017-09-18 MED ORDER — SENNOSIDES-DOCUSATE SODIUM 8.6-50 MG PO TABS: 1.0000 | ORAL_TABLET | Freq: Every evening | ORAL | Status: DC | PRN

## 2017-09-18 MED ORDER — CLOPIDOGREL BISULFATE 75 MG PO TABS
75.0000 mg | ORAL_TABLET | Freq: Every day | ORAL | Status: DC
  Administered 2017-09-18: 75 mg via ORAL
  Filled 2017-09-18: qty 1

## 2017-09-18 MED ORDER — POLYVINYL ALCOHOL 1.4 % OP SOLN
1.0000 [drp] | Freq: Two times a day (BID) | OPHTHALMIC | Status: DC | PRN
  Filled 2017-09-18: qty 15

## 2017-09-18 MED ORDER — ENTACAPONE 200 MG PO TABS
200.0000 mg | ORAL_TABLET | Freq: Every day | ORAL | Status: DC
  Administered 2017-09-18 – 2017-09-19 (×2): 200 mg via ORAL
  Filled 2017-09-18 (×2): qty 1

## 2017-09-18 MED ORDER — IOHEXOL 300 MG/ML  SOLN
INTRAMUSCULAR | Status: DC | PRN
  Administered 2017-09-18: 75 mL via INTRA_ARTERIAL

## 2017-09-18 MED ORDER — SODIUM CHLORIDE 0.9 % IV SOLN: 250.0000 mL | INTRAVENOUS | Status: DC | PRN

## 2017-09-18 MED ORDER — FENTANYL CITRATE (PF) 100 MCG/2ML IJ SOLN
INTRAMUSCULAR | Status: AC
  Filled 2017-09-18: qty 2

## 2017-09-18 MED ORDER — LOSARTAN POTASSIUM 50 MG PO TABS
100.0000 mg | ORAL_TABLET | Freq: Every day | ORAL | Status: DC
  Administered 2017-09-18 – 2017-09-21 (×4): 100 mg via ORAL
  Filled 2017-09-18 (×4): qty 2

## 2017-09-18 MED ORDER — ONDANSETRON HCL 4 MG PO TABS: 4.0000 mg | ORAL_TABLET | Freq: Four times a day (QID) | ORAL | Status: DC | PRN

## 2017-09-18 MED ORDER — SODIUM CHLORIDE 0.9% FLUSH
3.0000 mL | Freq: Two times a day (BID) | INTRAVENOUS | Status: DC
  Administered 2017-09-18 – 2017-09-19 (×2): 3 mL via INTRAVENOUS

## 2017-09-18 MED ORDER — LEVOTHYROXINE SODIUM 50 MCG PO TABS
50.0000 ug | ORAL_TABLET | Freq: Every day | ORAL | Status: DC
Start: 2017-09-19 — End: 2017-09-21
  Administered 2017-09-19 – 2017-09-20 (×2): 50 ug via ORAL
  Filled 2017-09-18 (×2): qty 1

## 2017-09-18 MED ORDER — ACETAMINOPHEN 325 MG PO TABS
650.0000 mg | ORAL_TABLET | Freq: Four times a day (QID) | ORAL | Status: DC | PRN
  Administered 2017-09-18: 650 mg via ORAL
  Filled 2017-09-18: qty 2

## 2017-09-18 MED ORDER — PANTOPRAZOLE SODIUM 40 MG PO TBEC
40.0000 mg | DELAYED_RELEASE_TABLET | Freq: Every day | ORAL | Status: DC
  Administered 2017-09-18 – 2017-09-21 (×4): 40 mg via ORAL
  Filled 2017-09-18 (×4): qty 1

## 2017-09-18 MED ORDER — LIDOCAINE HCL (PF) 1 % IJ SOLN
INTRAMUSCULAR | Status: AC
  Filled 2017-09-18: qty 30

## 2017-09-18 MED ORDER — CARBIDOPA-LEVODOPA 25-250 MG PO TABS
2.0000 | ORAL_TABLET | Freq: Three times a day (TID) | ORAL | Status: DC
  Administered 2017-09-18 – 2017-09-21 (×9): 2 via ORAL
  Filled 2017-09-18 (×13): qty 2

## 2017-09-18 MED ORDER — IPRATROPIUM-ALBUTEROL 0.5-2.5 (3) MG/3ML IN SOLN: 3.0000 mL | Freq: Four times a day (QID) | RESPIRATORY_TRACT | Status: DC

## 2017-09-18 MED ORDER — POTASSIUM CHLORIDE CRYS ER 20 MEQ PO TBCR
20.0000 meq | EXTENDED_RELEASE_TABLET | Freq: Every day | ORAL | Status: DC
  Administered 2017-09-18 – 2017-09-21 (×4): 20 meq via ORAL
  Filled 2017-09-18 (×3): qty 1

## 2017-09-18 MED ORDER — ORAL CARE MOUTH RINSE
15.0000 mL | Freq: Two times a day (BID) | OROMUCOSAL | Status: DC
  Administered 2017-09-19 – 2017-09-20 (×4): 15 mL via OROMUCOSAL

## 2017-09-18 MED ORDER — SODIUM CHLORIDE 0.9% FLUSH: 3.0000 mL | Freq: Two times a day (BID) | INTRAVENOUS | Status: DC

## 2017-09-18 MED ORDER — SODIUM CHLORIDE 0.9% FLUSH
3.0000 mL | INTRAVENOUS | Status: DC | PRN
  Administered 2017-09-20: 3 mL via INTRAVENOUS
  Filled 2017-09-18: qty 3

## 2017-09-18 MED ORDER — HEPARIN (PORCINE) IN NACL 1000-0.9 UT/500ML-% IV SOLN
INTRAVENOUS | Status: AC
  Filled 2017-09-18: qty 1000

## 2017-09-18 MED ORDER — MIDAZOLAM HCL 2 MG/2ML IJ SOLN
INTRAMUSCULAR | Status: AC
  Filled 2017-09-18: qty 2

## 2017-09-18 MED ORDER — CARVEDILOL 6.25 MG PO TABS
6.2500 mg | ORAL_TABLET | Freq: Two times a day (BID) | ORAL | Status: DC
  Administered 2017-09-18 – 2017-09-21 (×6): 6.25 mg via ORAL
  Filled 2017-09-18 (×6): qty 1

## 2017-09-18 MED ORDER — MIDAZOLAM HCL 2 MG/2ML IJ SOLN
INTRAMUSCULAR | Status: DC | PRN
  Administered 2017-09-18: 1 mg via INTRAVENOUS

## 2017-09-18 MED ORDER — FLUTICASONE PROPIONATE 50 MCG/ACT NA SUSP
1.0000 | Freq: Every day | NASAL | Status: DC
  Administered 2017-09-18 – 2017-09-21 (×4): 1 via NASAL
  Filled 2017-09-18: qty 16

## 2017-09-18 MED ORDER — ISOSORBIDE MONONITRATE ER 60 MG PO TB24
60.0000 mg | ORAL_TABLET | Freq: Every day | ORAL | Status: DC
  Administered 2017-09-18 – 2017-09-21 (×4): 60 mg via ORAL
  Filled 2017-09-18 (×4): qty 1

## 2017-09-18 MED ORDER — ASPIRIN 81 MG PO CHEW: 81.0000 mg | CHEWABLE_TABLET | ORAL | Status: DC

## 2017-09-18 MED ORDER — ESCITALOPRAM OXALATE 10 MG PO TABS
10.0000 mg | ORAL_TABLET | Freq: Every day | ORAL | Status: DC
  Administered 2017-09-18 – 2017-09-21 (×4): 10 mg via ORAL
  Filled 2017-09-18 (×4): qty 1

## 2017-09-18 MED ORDER — IPRATROPIUM-ALBUTEROL 0.5-2.5 (3) MG/3ML IN SOLN
3.0000 mL | Freq: Once | RESPIRATORY_TRACT | Status: AC
  Administered 2017-09-18: 3 mL via RESPIRATORY_TRACT

## 2017-09-18 MED ORDER — ALBUTEROL SULFATE (2.5 MG/3ML) 0.083% IN NEBU
2.5000 mg | INHALATION_SOLUTION | RESPIRATORY_TRACT | Status: DC | PRN
Start: 2017-09-18 — End: 2017-09-21
  Administered 2017-09-18 – 2017-09-19 (×2): 2.5 mg via RESPIRATORY_TRACT
  Filled 2017-09-18 (×3): qty 3

## 2017-09-18 MED ORDER — CYCLOBENZAPRINE HCL 10 MG PO TABS: 5.0000 mg | ORAL_TABLET | Freq: Every day | ORAL | Status: DC | PRN

## 2017-09-18 MED ORDER — SODIUM CHLORIDE 0.9 % IV SOLN
INTRAVENOUS | Status: DC
  Administered 2017-09-18: 09:00:00 via INTRAVENOUS

## 2017-09-18 MED ORDER — ROSUVASTATIN CALCIUM 10 MG PO TABS
10.0000 mg | ORAL_TABLET | Freq: Every day | ORAL | Status: DC
  Administered 2017-09-18 – 2017-09-20 (×3): 10 mg via ORAL
  Filled 2017-09-18 (×3): qty 1

## 2017-09-18 SURGICAL SUPPLY — 16 items
CATH INFINITI 5FR JL4 (CATHETERS) ×2 IMPLANT
CATH INFINITI JR4 5F (CATHETERS) ×2 IMPLANT
CATH SWANZ 7F THERMO (CATHETERS) ×2 IMPLANT
DEVICE CLOSURE MYNXGRIP 5F (Vascular Products) ×2 IMPLANT
GUIDEWIRE EMER 3M J .025X150CM (WIRE) ×2 IMPLANT
KIT MANI 3VAL PERCEP (MISCELLANEOUS) ×2 IMPLANT
KIT RIGHT HEART (MISCELLANEOUS) ×2 IMPLANT
NEEDLE PERC 18GX7CM (NEEDLE) ×2 IMPLANT
NEEDLE PERC 21GX4CM (NEEDLE) IMPLANT
PACK CARDIAC CATH (CUSTOM PROCEDURE TRAY) ×2 IMPLANT
SHEATH AVANTI 5FR X 11CM (SHEATH) ×2 IMPLANT
SHEATH AVANTI 7FRX11 (SHEATH) ×2 IMPLANT
SHEATH GLIDE SLENDER 4/5FR (SHEATH) IMPLANT
SHEATH RAIN RADIAL 21G 6FR (SHEATH) IMPLANT
WIRE GUIDERIGHT .035X150 (WIRE) ×2 IMPLANT
WIRE ROSEN-J .035X260CM (WIRE) IMPLANT

## 2017-09-18 NOTE — ED Provider Notes (Signed)
[facility] Emergency Department Provider Note   ____________________________________________    I have reviewed the triage vital signs and the nursing notes.   HISTORY  Chief Complaint Shortness of Breath     HPI Andre [name] is a 82 y.o. male who presents with complaints of shortness of breath.  Patient is status post cardiac catheterization today by Dr. Fletcher Anon.  Attempting to find out why he is feeling short of breath, per Dr. Fletcher Anon heart catheterization overall likely not the cause of his shortness of breath.  Patient felt like "he was smothering "decided to check into the emergency department.  Denies fevers or chills or cough.  No chest pain.  Admitted for pneumonia 3 weeks ago.  No calf pain or swelling.   Past Medical History:  Diagnosis Date  . Cervical spondylosis 10/01/2013  . Chronic diastolic CHF (congestive heart failure) ([place])    a. 07/2016 Echo: >55%; b. 10/2016 Echo: EF 55-60%, Gr1 DD, Ao sclerosis w/o stenosis, sev dil LA; c. 08/2017 Echo: EF 60-65%, no rwma, Gr2 DD, mild AS, sev dil LA/RA.  [name] Coronary artery disease    a. 1998 s/p mini-cabg @ [facility] - LIMA->LAD;  b. 07/2016 St Echo:  Inadequate HR (max 97) w/ hypertensive response (220/96). Ex time only 2:54 - stopped due to dyspnea and leg pain;  c.  08/2016 MV: EF 67%, no ischemia, low risk; d. 10/2016 NSTEMI/Cath: LM 40, LAD 100ost, RI 80, LCX nl, RCA 95p (4.0x26 Onyx DES), 64m, LIMA->LAD nl, EF 50-55%.  . Depression   . GERD (gastroesophageal reflux disease)   . Hyperlipidemia   . Hypertension   . PAF (paroxysmal atrial fibrillation) ([facility])    a. s/p DCCV-->maintaining sinus on amiodarone;  b. CHA2DS2VASc = 5-->eliquis.  . Parkinson's disease ([place])    tremors  . Pulmonary embolism ([place]) 2011  . 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 Sleep apnea    wears CPAP    Patient Active Problem List   Diagnosis Date Noted  . PNA (pneumonia) 08/26/2017  . Diarrhea 08/21/2017  . Anemia  08/21/2017  . Acute respiratory failure ([place]) 08/11/2017  . Renal lesion 07/08/2017  . Fall 05/29/2017  . Lightheadedness 05/29/2017  . Abrasion 02/08/2017  . Anxiety and depression 01/05/2017  . Prediabetes 10/27/2016  . Hypothyroidism 10/27/2016  . (HFpEF) heart failure with preserved ejection fraction ([place]) 09/27/2016  . Parkinson's disease ([place]) 06/30/2016  . Thrombocytopenia ([place]) 01/29/2016  . PAF (paroxysmal atrial fibrillation) ([place])   . BMI 40.0-44.9, adult ([place]) 05/06/2015  . Erythrocytosis 01/28/2015  . Atherosclerosis of abdominal aorta ([place]) 12/08/2014  . Barrett's esophagus 12/31/2013  . DDD (degenerative disc disease), cervical 10/01/2013  . DDD (degenerative disc disease), lumbar 10/01/2013  . Coronary artery disease involving native coronary artery of native heart with angina pectoris ([place]) 10/30/2012  . GERD (gastroesophageal reflux disease) 10/30/2012  . Hyperlipidemia with target LDL less than 70 10/30/2012  . Essential hypertension 10/30/2012  . Dyspnea 09/10/2012  . Allergic rhinitis 09/10/2012  . OSA (obstructive sleep apnea) 09/10/2012    Past Surgical History:  Procedure Laterality Date  . BACK SURGERY  1960  . CARDIAC CATHETERIZATION    . CHOLECYSTECTOMY  2010  . CORONARY ARTERY BYPASS GRAFT  01/07/1997  . CORONARY STENT INTERVENTION N/A 10/31/2016   Procedure: Coronary Stent Intervention;  Surgeon: Wellington Hampshire, MD;  Location: [place] [facility];  Service: Cardiovascular;  Laterality: N/A;  . ELECTROPHYSIOLOGIC STUDY N/A 07/14/2015   Procedure: CARDIOVERSION;  Surgeon:  Yolonda Kida, MD;  Location: [facility] ORS;  Service: Cardiovascular;  Laterality: N/A;  . ELECTROPHYSIOLOGIC STUDY N/A 10/12/2015   Procedure: CARDIOVERSION;  Surgeon: Minna Merritts, MD;  Location: [facility] ORS;  Service: Cardiovascular;  Laterality: N/A;  . LEFT HEART CATH AND CORONARY ANGIOGRAPHY N/A 10/31/2016   Procedure: Left Heart Cath and Coronary Angiography;  Surgeon:  Wellington Hampshire, MD;  Location: [facility];  Service: Cardiovascular;  Laterality: N/A;  . OTHER SURGICAL HISTORY  1998   Bypass  . RIGHT/LEFT HEART CATH AND CORONARY ANGIOGRAPHY N/A 09/18/2017   Procedure: RIGHT/LEFT HEART CATH AND CORONARY ANGIOGRAPHY;  Surgeon: Wellington Hampshire, MD;  Location: [facility];  Service: Cardiovascular;  Laterality: N/A;    Prior to Admission medications   Medication Sig Start Date End Date Taking? Authorizing Provider  acetaminophen (TYLENOL) 500 MG tablet Take 1,000 mg by mouth every 6 (six) hours as needed for mild pain or moderate pain.     [provider]  albuterol (PROVENTIL) (2.5 MG/3ML) 0.083% nebulizer solution Take 3 mLs (2.5 mg total) by nebulization every 4 (four) hours as needed for wheezing or shortness of breath. Patient taking differently: Take 2.5 mg by nebulization 2 (two) times daily.  08/24/17   Flora Lipps, MD  carbidopa-levodopa (SINEMET) 25-250 MG tablet Take 2 tablets by mouth 3 (three) times daily. 02/01/17   Kathrynn Ducking, MD  carvedilol (COREG) 6.25 MG tablet TAKE ONE TABLET BY MOUTH TWICE DAILY WITH A MEAL 07/24/17   Wellington Hampshire, MD  clopidogrel (PLAVIX) 75 MG tablet TAKE ONE TABLET BY MOUTH EVERY DAY WITH BREAKFAST 07/06/17   Wellington Hampshire, MD  cyclobenzaprine (FLEXERIL) 5 MG tablet Take 5 mg by mouth daily as needed for muscle spasms.     [provider]  ELIQUIS 5 MG TABS tablet TAKE ONE TABLET TWICE DAILY 02/06/17   Wellington Hampshire, MD  entacapone (COMTAN) 200 MG tablet Take 200 mg by mouth daily. 09/12/17   [provider]  EPINEPHrine 0.3 mg/0.3 mL IJ SOAJ injection Inject 0.3 mg as directed once as needed (allergic reaction). Reported on 10/27/2015 07/01/13   [provider]  escitalopram (LEXAPRO) 10 MG tablet Take 1 tablet (10 mg total) by mouth daily. 09/12/17   [name], MD  esomeprazole (NEXIUM) 20 MG capsule Take 40 mg by mouth daily at 12 noon.     [provider]  fluticasone (FLONASE) 50 MCG/ACT nasal spray TAKE 2 PUFFS IN EACH NOSTRIL EVERY DAY 10/18/16   Thersa Salt G, DO  furosemide (LASIX) 20 MG tablet Take 2 tablets (40 mg total) by mouth daily. 09/06/17   Wellington Hampshire, MD  HYDROcodone-acetaminophen (NORCO/VICODIN) 5-325 MG tablet Take 1 tablet by mouth daily as needed (neck pain).  06/16/17   [provider]  isosorbide mononitrate (IMDUR) 60 MG 24 hr tablet Take 1.5 tablets (90 mg total) by mouth daily. Patient taking differently: Take 60 mg by mouth daily.  08/28/17   Fritzi Mandes, MD  levothyroxine (SYNTHROID, LEVOTHROID) 50 MCG tablet Take 50 mcg by mouth daily before lunch.  09/07/16   [provider]  losartan (COZAAR) 100 MG tablet Take 1 tablet (100 mg total) by mouth daily. 02/14/17   Wellington Hampshire, MD  mirabegron ER (MYRBETRIQ) 50 MG TB24 tablet Take 50 mg by mouth daily.    [provider]  Polyethyl Glycol-Propyl Glycol (SYSTANE OP) Place 1 drop into both eyes 2 (two) times daily as  needed (dry eyes).    [provider]  potassium chloride SA (K-DUR,KLOR-CON) 20 MEQ tablet Take 1 tablet (20 mEq total) by mouth daily. 09/06/17   Wellington Hampshire, MD  rosuvastatin (CRESTOR) 10 MG tablet Take 10 mg by mouth at bedtime.    [provider]  VENTOLIN HFA 108 (90 Base) MCG/ACT inhaler TAKE 2 PUFFS EVERY 8 HOURS AS NEEDED FORWHEEZING 12/02/16   Coral Spikes, DO     Allergies Pravastatin and Prednisone  Family History  Problem Relation Age of Onset  . Alcohol abuse Father     Social History Social History   Tobacco Use  . Smoking status: Former Smoker    Packs/day: 1.00    Years: 40.00    Pack years: 40.00    Types: Cigarettes, Pipe, Cigars    Last attempt to quit: 04/18/1972    Years since quitting: 45.4  . Smokeless tobacco: Former Systems developer    Types: Chew    Quit date: 04/18/1972  Substance Use Topics  . Alcohol use: Yes    Alcohol/week: 2.4 oz    Types: 2 Cans  of beer, 2 Shots of liquor per week    Comment: per 2 weeks   . Drug use: No    Review of Systems  Constitutional: No fever/chills Eyes: No visual changes.  ENT: No sore throat. Cardiovascular: Denies chest pain. Respiratory: As above Gastrointestinal: No abdominal pain.    Genitourinary: Negative for dysuria. Musculoskeletal: Negative for back pain. Skin: Negative for rash. Neurological: Negative for headaches    ____________________________________________   PHYSICAL EXAM:  VITAL SIGNS: ED Triage Vitals  Enc Vitals Group     BP 09/18/17 1235 (!) 150/62     Pulse Rate 09/18/17 1235 68     Resp 09/18/17 1235 18     Temp 09/18/17 1235 98.2 F (36.8 C)     Temp Source 09/18/17 1235 Oral     SpO2 09/18/17 1235 93 %     Weight 09/18/17 1236 113.4 kg (250 lb)     Height 09/18/17 1236 1.727 m (5\' 8" )     Head Circumference --      Peak Flow --      Pain Score 09/18/17 1236 0     Pain Loc --      Pain Edu? --      Excl. in [place]? --     Constitutional: Alert and oriented.  Eyes: Conjunctivae are normal.   Nose: No congestion/rhinnorhea. Mouth/Throat: Mucous membranes are moist.    Cardiovascular: Normal rate, regular rhythm. Grossly normal heart sounds.  Good peripheral circulation. Respiratory: Tachypnea no retractions.  Decreased breath sounds on the right Gastrointestinal: Soft and nontender. No distention.    Musculoskeletal: No lower extremity tenderness nor edema.  Warm and well perfused Neurologic:  Normal speech and language. No gross focal neurologic deficits are appreciated.  Skin:  Skin is warm, dry and intact. No rash noted. Psychiatric: Mood and affect are normal. Speech and behavior are normal.  ____________________________________________   LABS (all labs ordered are listed, but only abnormal results are displayed)  Labs Reviewed  BASIC METABOLIC PANEL - Abnormal; Notable for the following components:      Result Value   Chloride 100 (*)     Glucose, Bld 125 (*)    Calcium 8.7 (*)    All other components within normal limits  CBC - Abnormal; Notable for the following components:   RDW 15.1 (*)    All other components  within normal limits   ____________________________________________  EKG  ED ECG REPORT I, Lavonia Drafts, the attending physician, personally viewed and interpreted this ECG.  Date: 09/18/2017  Rhythm: normal sinus rhythm QRS Axis: normal Intervals: normal ST/T Wave abnormalities: normal Narrative Interpretation: no evidence of acute ischemia  ____________________________________________  RADIOLOGY  Chest x-ray shows large right pleural effusion ____________________________________________   PROCEDURES  Procedure(s) performed: No  Procedures   Critical Care performed: No ____________________________________________   INITIAL IMPRESSION / ASSESSMENT AND PLAN / ED COURSE  Pertinent labs & imaging results that were available during my care of the patient were reviewed by me and considered in my medical decision making (see chart for details).  Patient with significant enlargement of right pleural effusion on chest x-ray today.  This is likely the cause of shortness of breath.  He is hypoxic with any ambulation.  No chest pain or fevers or white blood cell count.  Will admit to the hospitalist service for further evaluation, likely thoracentesis    ____________________________________________   FINAL CLINICAL IMPRESSION(S) / ED DIAGNOSES  Final diagnoses:  Pleural effusion  SOB (shortness of breath)        Note:  This document was prepared using Dragon voice recognition software and may include unintentional dictation errors.   Lavonia Drafts, MD 09/18/17 [phone]

## 2017-09-18 NOTE — Discharge Instructions (Signed)
Resume Eliquis tomorrow if no bleeding complications.

## 2017-09-18 NOTE — Telephone Encounter (Signed)
Copied from [place] [phone]. Topic: General - Other >> Sep 18, 2017 12:09 PM Carolyn Stare wrote:  Pt wife call to say the nurse at [facility] said the pt can ask his doctor if the following meds will help pt relax and breathe better so she is calling to ask Dr Biagio Quint if he will RX trazadone,Xanax  Ativan   Called and left voicemail for patient wife ( on [place] ) on home and cell phone to call office about medication request. I did mention on the voicemail that Dr. [name] did send in [place] for anxiety so maybe that would help but to give the office a call back.

## 2017-09-18 NOTE — ED Notes (Signed)
Paitent;s oxygen saturation 88% when transferring patient from wheelchair to stretcher. DOE noted, improves with rest and O2.  Oxygen placed on patient 2l/[place]

## 2017-09-18 NOTE — ED Notes (Signed)
ED Provider at bedside. 

## 2017-09-18 NOTE — Progress Notes (Signed)
Advanced care plan. Purpose of the Encounter: CODE STATUS Parties in Attendance: Patient Patient's Decision Capacity: Good Subjective/Patient's story: Presented to the emergency room for shortness of breath Objective/Medical story Has right lung pleural effusion Goals of care determination:  Advance care directives and goals of care discussed with the patient and family For now patient and family want everything done which includes cardiac resuscitation, intubation and ventilator if the need arises CODE STATUS: Full code Time spent discussing advanced care planning: 16 minutes

## 2017-09-18 NOTE — ED Notes (Signed)
Attempted to call report

## 2017-09-18 NOTE — Interval H&P Note (Signed)
History and Physical Interval Note:  09/18/2017 9:37 AM  Andre [name]  has presented today for surgery, with the diagnosis of LT and RT Heart Cath. The patient continued to have severe exertional dyspnea of unclear etiology in spite of multiple testing.  Given severity of his symptoms and known history of coronary artery disease, I recommended proceeding with a right and left cardiac catheterization.  I discussed the procedure in details as well as risks and benefits.  I decided not to proceed with stress testing given morbid obesity and decreased utility.    The various methods of treatment have been discussed with the patient and family. After consideration of risks, benefits and other options for treatment, the patient has consented to  Procedure(s): RIGHT/LEFT HEART CATH AND CORONARY ANGIOGRAPHY (N/A) as a surgical intervention .  The patient's history has been reviewed, patient examined, no change in status, stable for surgery.  I have reviewed the patient's chart and labs.  Questions were answered to the patient's satisfaction.     [name]

## 2017-09-18 NOTE — ED Triage Notes (Signed)
First Nurse Note:  Arrives from Outpatient s/p right and left cardiac catheterization today.  Patient has been recovered from procedure, but c/o sob and feeling "like he was being smothered".  Sent to ED for evaluation.  Patient has had 2 duo nebs and had a room air saturation test for 20 min, where sat remained 92%.  2 IV's in place.  20g RAC and 22 ga Left hand.  Patient is AAOx3.  Skin warm and dry. NAD.  No SOB/ DOE noted.

## 2017-09-18 NOTE — H&P (Signed)
[facility] at [place] NAME: Andre [name]    MR#:  [id number]  DATE OF BIRTH:  Jul 15, 1935  DATE OF ADMISSION:  09/18/2017  PRIMARY CARE PHYSICIAN: [name], MD   REQUESTING/REFERRING PHYSICIAN:   CHIEF COMPLAINT:   Chief Complaint  Patient presents with  . Shortness of Breath    HISTORY OF PRESENT ILLNESS: Andre [name]  is a 82 y.o. male with a known history of chronic diastolic heart failure, coronary artery disease, GERD, hyperlipidemia, hypertension, paroxysmal atrial fibrillation, Parkinson's disease, pulmonary embolism on Eliquis for anticoagulation presented to the emergency room for shortness of breath.  Patient had cardiac catheterization today by Dr. Fletcher Anon.  After the procedure patient felt short of breath.  He came to the emergency room for the further evaluation.  Imaging studies in the emergency room showed a large right pleural effusion.  No complaints of any chest pain.  Catheterization was unremarkable according to Dr. Fletcher Anon when he spoke to ER physician.  No fever and chills.  Patient was treated for pneumonia 3 weeks ago.  PAST MEDICAL HISTORY:   Past Medical History:  Diagnosis Date  . Cervical spondylosis 10/01/2013  . Chronic diastolic CHF (congestive heart failure) ([place])    a. 07/2016 Echo: >55%; b. 10/2016 Echo: EF 55-60%, Gr1 DD, Ao sclerosis w/o stenosis, sev dil LA; c. 08/2017 Echo: EF 60-65%, no rwma, Gr2 DD, mild AS, sev dil LA/RA.  [name] Coronary artery disease    a. 1998 s/p mini-cabg @ [facility] - LIMA->LAD;  b. 07/2016 St Echo:  Inadequate HR (max 97) w/ hypertensive response (220/96). Ex time only 2:54 - stopped due to dyspnea and leg pain;  c.  08/2016 MV: EF 67%, no ischemia, low risk; d. 10/2016 NSTEMI/Cath: LM 40, LAD 100ost, RI 80, LCX nl, RCA 95p (4.0x26 Onyx DES), 93m, LIMA->LAD nl, EF 50-55%.  . Depression   . GERD (gastroesophageal reflux disease)   . Hyperlipidemia   . Hypertension   . PAF (paroxysmal  atrial fibrillation) ([facility])    a. s/p DCCV-->maintaining sinus on amiodarone;  b. CHA2DS2VASc = 5-->eliquis.  . Parkinson's disease ([place])    tremors  . Pulmonary embolism ([place]) 2011  . 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 Sleep apnea    wears CPAP    PAST SURGICAL HISTORY:  Past Surgical History:  Procedure Laterality Date  . BACK SURGERY  1960  . CARDIAC CATHETERIZATION    . CHOLECYSTECTOMY  2010  . CORONARY ARTERY BYPASS GRAFT  01/07/1997  . CORONARY STENT INTERVENTION N/A 10/31/2016   Procedure: Coronary Stent Intervention;  Surgeon: Wellington Hampshire, MD;  Location: [place] [facility];  Service: Cardiovascular;  Laterality: N/A;  . ELECTROPHYSIOLOGIC STUDY N/A 07/14/2015   Procedure: CARDIOVERSION;  Surgeon: Yolonda Kida, MD;  Location: [facility];  Service: Cardiovascular;  Laterality: N/A;  . ELECTROPHYSIOLOGIC STUDY N/A 10/12/2015   Procedure: CARDIOVERSION;  Surgeon: Minna Merritts, MD;  Location: [facility];  Service: Cardiovascular;  Laterality: N/A;  . LEFT HEART CATH AND CORONARY ANGIOGRAPHY N/A 10/31/2016   Procedure: Left Heart Cath and Coronary Angiography;  Surgeon: Wellington Hampshire, MD;  Location: [place] [facility];  Service: Cardiovascular;  Laterality: N/A;  . OTHER SURGICAL HISTORY  1998   Bypass  . RIGHT/LEFT HEART CATH AND CORONARY ANGIOGRAPHY N/A 09/18/2017   Procedure: RIGHT/LEFT HEART CATH AND CORONARY ANGIOGRAPHY;  Surgeon: Wellington Hampshire, MD;  Location: [place] [facility];  Service: Cardiovascular;  Laterality: N/A;  SOCIAL HISTORY:  Social History   Tobacco Use  . Smoking status: Former Smoker    Packs/day: 1.00    Years: 40.00    Pack years: 40.00    Types: Cigarettes, Pipe, Cigars    Last attempt to quit: 04/18/1972    Years since quitting: 45.4  . Smokeless tobacco: Former Systems developer    Types: Chew    Quit date: 04/18/1972  Substance Use Topics  . Alcohol use: Yes    Alcohol/week: 2.4 oz    Types: 2 Cans of beer, 2 Shots of liquor per  week    Comment: per 2 weeks     FAMILY HISTORY:  Family History  Problem Relation Age of Onset  . Alcohol abuse Father     DRUG ALLERGIES:  Allergies  Allergen Reactions  . Pravastatin Other (See Comments)  . Prednisone Other (See Comments)    Pt states that med makes him hyper Pt states that med makes him hyper    REVIEW OF SYSTEMS:   CONSTITUTIONAL: No fever,has fatigue and weakness.  EYES: No blurred or double vision.  EARS, NOSE, AND THROAT: No tinnitus or ear pain.  RESPIRATORY: Has cough, shortness of breath,  No wheezing or hemoptysis.  CARDIOVASCULAR: No chest pain, orthopnea, edema.  GASTROINTESTINAL: No nausea, vomiting, diarrhea or abdominal pain.  GENITOURINARY: No dysuria, hematuria.  ENDOCRINE: No polyuria, nocturia,  HEMATOLOGY: No anemia, easy bruising or bleeding SKIN: No rash or lesion. MUSCULOSKELETAL: No joint pain or arthritis.   NEUROLOGIC: No tingling, numbness, weakness.  PSYCHIATRY: No anxiety or depression.   MEDICATIONS AT HOME:  Prior to Admission medications   Medication Sig Start Date End Date Taking? Authorizing Provider  acetaminophen (TYLENOL) 500 MG tablet Take 1,000 mg by mouth every 6 (six) hours as needed for mild pain or moderate pain.     [provider]  albuterol (PROVENTIL) (2.5 MG/3ML) 0.083% nebulizer solution Take 3 mLs (2.5 mg total) by nebulization every 4 (four) hours as needed for wheezing or shortness of breath. Patient taking differently: Take 2.5 mg by nebulization 2 (two) times daily.  08/24/17   Flora Lipps, MD  carbidopa-levodopa (SINEMET) 25-250 MG tablet Take 2 tablets by mouth 3 (three) times daily. 02/01/17   Kathrynn Ducking, MD  carvedilol (COREG) 6.25 MG tablet TAKE ONE TABLET BY MOUTH TWICE DAILY WITH A MEAL 07/24/17   Wellington Hampshire, MD  clopidogrel (PLAVIX) 75 MG tablet TAKE ONE TABLET BY MOUTH EVERY DAY WITH BREAKFAST 07/06/17   Wellington Hampshire, MD  cyclobenzaprine (FLEXERIL) 5 MG tablet Take 5  mg by mouth daily as needed for muscle spasms.     [provider]  ELIQUIS 5 MG TABS tablet TAKE ONE TABLET TWICE DAILY 02/06/17   Wellington Hampshire, MD  EPINEPHrine 0.3 mg/0.3 mL IJ SOAJ injection Inject 0.3 mg as directed once as needed (allergic reaction). Reported on 10/27/2015 07/01/13   [provider]  escitalopram (LEXAPRO) 10 MG tablet Take 1 tablet (10 mg total) by mouth daily. 09/12/17   [name], MD  esomeprazole (NEXIUM) 20 MG capsule Take 40 mg by mouth daily at 12 noon.    [provider]  fluticasone (FLONASE) 50 MCG/ACT nasal spray TAKE 2 PUFFS IN EACH NOSTRIL EVERY DAY 10/18/16   Thersa Salt G, DO  furosemide (LASIX) 20 MG tablet Take 2 tablets (40 mg total) by mouth daily. 09/06/17   Wellington Hampshire, MD  HYDROcodone-acetaminophen (NORCO/VICODIN) 5-325 MG tablet Take 1 tablet by  mouth daily as needed (neck pain).  06/16/17   [provider]  isosorbide mononitrate (IMDUR) 60 MG 24 hr tablet Take 1.5 tablets (90 mg total) by mouth daily. Patient taking differently: Take 60 mg by mouth daily.  08/28/17   Fritzi Mandes, MD  levothyroxine (SYNTHROID, LEVOTHROID) 50 MCG tablet Take 50 mcg by mouth daily before lunch.  09/07/16   [provider]  losartan (COZAAR) 100 MG tablet Take 1 tablet (100 mg total) by mouth daily. 02/14/17   Wellington Hampshire, MD  mirabegron ER (MYRBETRIQ) 50 MG TB24 tablet Take 50 mg by mouth daily.    [provider]  Polyethyl Glycol-Propyl Glycol (SYSTANE OP) Place 1 drop into both eyes 2 (two) times daily as needed (dry eyes).    [provider]  potassium chloride SA (K-DUR,KLOR-CON) 20 MEQ tablet Take 1 tablet (20 mEq total) by mouth daily. 09/06/17   Wellington Hampshire, MD  rosuvastatin (CRESTOR) 10 MG tablet Take 10 mg by mouth at bedtime.    [provider]  VENTOLIN HFA 108 (90 Base) MCG/ACT inhaler TAKE 2 PUFFS EVERY 8 HOURS AS NEEDED FORWHEEZING 12/02/16   Thersa Salt G, DO       PHYSICAL EXAMINATION:   VITAL SIGNS: Blood pressure (!) 155/78, pulse 66, temperature 98.2 F (36.8 C), temperature source Oral, resp. rate 20, height 5\' 8"  (1.727 m), weight 113.4 kg (250 lb), SpO2 98 %.  GENERAL:  82 y.o.-year-old patient lying in the bed with no acute distress.  EYES: Pupils equal, round, reactive to light and accommodation. No scleral icterus. Extraocular muscles intact.  HEENT: Head atraumatic, normocephalic. Oropharynx and nasopharynx clear.  NECK:  Supple, no jugular venous distention. No thyroid enlargement, no tenderness.  LUNGS: Decreased breath sounds bilaterally, dullness to percussion on the right lung, crepitations heard in the right lung. No use of accessory muscles of respiration.  CARDIOVASCULAR: S1, S2 normal. No murmurs, rubs, or gallops.  ABDOMEN: Soft, nontender, nondistended. Bowel sounds present. No organomegaly or mass.  EXTREMITIES: No pedal edema, cyanosis, or clubbing.  NEUROLOGIC: Cranial nerves II through XII are intact. Muscle strength 5/5 in all extremities. Sensation intact. Gait not checked.  PSYCHIATRIC: The patient is alert and oriented x 3.  SKIN: No obvious rash, lesion, or ulcer.   LABORATORY PANEL:   CBC Recent Labs  Lab 09/12/17 1332 09/18/17 1249  WBC 6.6 8.2  HGB 13.7 13.5  HCT 41.2 40.8  PLT 128* 183  MCV 90.9 91.4  MCH 30.3 30.2  MCHC 33.4 33.0  RDW 14.5 15.1*  LYMPHSABS 1.4  --   MONOABS 0.9  --   EOSABS 0.3  --   BASOSABS 0.0  --    ------------------------------------------------------------------------------------------------------------------  Chemistries  Recent Labs  Lab 09/12/17 1332 09/18/17 1249  NA 136 138  K 3.6 3.6  CL 99* 100*  CO2 27 25  GLUCOSE 129* 125*  BUN 15 9  CREATININE 0.80 0.85  CALCIUM 8.3* 8.7*   ------------------------------------------------------------------------------------------------------------------ estimated creatinine clearance is 86.1 mL/min (by C-G formula  based on SCr of 0.85 mg/dL). ------------------------------------------------------------------------------------------------------------------ No results for input(s): TSH, T4TOTAL, T3FREE, THYROIDAB in the last 72 hours.  Invalid input(s): FREET3   Coagulation profile No results for input(s): INR, PROTIME in the last 168 hours. ------------------------------------------------------------------------------------------------------------------- No results for input(s): DDIMER in the last 72 hours. -------------------------------------------------------------------------------------------------------------------  Cardiac Enzymes No results for input(s): CKMB, TROPONINI, MYOGLOBIN in the last 168 hours.  Invalid input(s): CK ------------------------------------------------------------------------------------------------------------------ Invalid input(s): POCBNP  ---------------------------------------------------------------------------------------------------------------  Urinalysis  No results found for: COLORURINE, APPEARANCEUR, LABSPEC, PHURINE, GLUCOSEU, HGBUR, BILIRUBINUR, KETONESUR, PROTEINUR, UROBILINOGEN, NITRITE, LEUKOCYTESUR   RADIOLOGY: Dg Chest 2 View  Result Date: 09/18/2017 CLINICAL DATA:  Shortness of breath, weakness EXAM: CHEST - 2 VIEW COMPARISON:  09/05/2017. FINDINGS: Large right pleural effusion is increased since prior study. Right lower lobe atelectasis. Cardiomegaly. Prior CABG. No confluent opacity on the left. No left effusion. No acute bony abnormality. IMPRESSION: Enlarging right pleural effusion, now large with increasing right lower lobe atelectasis. Cardiomegaly. Electronically Signed   By: Rolm Baptise M.D.   On: 09/18/2017 13:14    EKG: Orders placed or performed during the hospital encounter of 09/18/17  . ED EKG  . ED EKG    IMPRESSION AND PLAN:  82 year old male patient with history of coronary artery disease, diastolic heart failure,  paroxysmal atrial fibrillation, hypertension, hyperlipidemia, Parkinson's disease presented to the emergency room for shortness of breath.  -Right lung pleural effusion Diurese patient with IV Lasix Interventional radiology consultation for thoracentesis  -Acute respiratory distress with hypoxia Oxygen via nasal cannula  dyspnea secondary to pleural effusion  - coronary artery disease Had cardiac cath today Cardiology follow-up  -Diastolic heart failure Continue diuresis with IV Lasix  -Parkinson's disease resume carbidopa levodopa  -Paroxysmal atrial fibrillation We will hold Plavix for now for possible thoracentesis procedure Currently on oral Eliquis for anticoagulation Rate under control  All the records are reviewed and case discussed with ED provider. Management plans discussed with the patient, family and they are in agreement.  CODE STATUS: Full code Code Status History    Date Active Date Inactive Code Status Order ID Comments User Context   09/18/2017 1053 09/18/2017 1225 Full Code [id number]  Wellington Hampshire, MD Inpatient   08/26/2017 1123 08/28/2017 2114 Full Code [id number]  Bettey Costa, MD Inpatient   08/11/2017 0409 08/13/2017 0204 Full Code [id number]  Amelia Jo, MD Inpatient   10/28/2016 1219 11/02/2016 1323 Full Code [id number]  Demetrios Loll, MD Inpatient       TOTAL TIME TAKING CARE OF THIS PATIENT: 55 minutes.    Saundra Shelling M.D on 09/18/2017 at 3:17 PM  Between 7am to 6pm - Pager - [phone]  After 6pm go to [email or web address] - password [email or web address] [address] [facility]  [place] [facility]  Office  [phone]  CC: Primary care physician; [name], MD

## 2017-09-18 NOTE — Telephone Encounter (Signed)
Please advise 

## 2017-09-18 NOTE — Progress Notes (Signed)
Admitted from the ED with SOB.  He had a cardiac cath this morning, was discharged from the cath, had SOB walking to the car, phones his physician and was told to report to the ED.  Difficulty breathing, and catching his breath, improved with Albuterol neb.

## 2017-09-18 NOTE — Telephone Encounter (Signed)
Patient was notified by pec

## 2017-09-18 NOTE — Progress Notes (Signed)
The patient had a right and left cardiac catheterization today which showed patent LIMA to LAD and patent RCA stent.  Ostial ramus and diagonal disease was noted to be unchanged from before.  Right heart catheterization showed only mildly elevated filling pressures with minimal pulmonary hypertension and normal cardiac output.  His dyspnea was felt to be noncardiac.  The patient was discharged after catheterization but due to severe dyspnea at rest, he decided to go to the emergency room for evaluation where he was found to have an enlarging right pleural effusion which is the likely culprit for his increased dyspnea.  Recommendations: Recommend diagnostic and therapeutic right thoracentesis. Continue to hold Eliquis given that he had cardiac catheterization via the femoral artery and veins today.  If no bleeding issues, Eliquis can be resumed tomorrow after thoracentesis.  Plavix can be continued.  Full consultation can be performed tomorrow if desired.

## 2017-09-18 NOTE — Telephone Encounter (Addendum)
I recently sent in Lexapro for him to start on.  That could potentially help with his anxiety which may be driving some of his breathing issues.  He is currently hospitalized and any medication choices while he is in the hospital will be up to the hospitalist.  Thanks.

## 2017-09-18 NOTE — Progress Notes (Signed)
Patient insisting on being admitted to the hospital. States he is not comfortable going home. States he is scared that he is suffocating. Patient given trial of room air and sats stayed at 92%. Spoke with Dr. Fletcher Anon and he said patient could contact his pulmonology MD or go to ER for any worsening symptoms. Family states they want him to go to ER from here. I have called charge RN Nira Conn) and let her know that patient will be wheeled to lobby to await registration and possible further treatment. Family is in agreement.

## 2017-09-18 NOTE — Progress Notes (Signed)
Pt. Severely SOB , even at rest. MD called - new orders received. Dr. Fletcher Anon at bedside now to see pt. & assess pt. Resp. Treatment just completed. MD speaking with pt. & spouse. Pt. Remains SOB, even at rest. (HX: heart failure).

## 2017-09-18 NOTE — ED Triage Notes (Signed)
Had cardiac cath today.  Was sent here for shortness of breath. Per family he is normally short fo breath recently due to couple bouts of pneumonia.  They gave him a svn.  Then sent here.

## 2017-09-19 ENCOUNTER — Inpatient Hospital Stay: Payer: PPO

## 2017-09-19 LAB — BASIC METABOLIC PANEL
Anion gap: 11 (ref 5–15)
BUN: 12 mg/dL (ref 6–20)
CO2: 28 mmol/L (ref 22–32)
Calcium: 8.6 mg/dL — ABNORMAL LOW (ref 8.9–10.3)
Chloride: 101 mmol/L (ref 101–111)
Creatinine, Ser: 1.02 mg/dL (ref 0.61–1.24)
GFR calc Af Amer: >60 mL/min (ref >60)
GFR calc non Af Amer: >60 mL/min (ref >60)
Glucose, Bld: 125 mg/dL — ABNORMAL HIGH (ref 65–99)
Potassium: 3.8 mmol/L (ref 3.5–5.1)
Sodium: 140 mmol/L (ref 135–145)

## 2017-09-19 LAB — ALBUMIN, PLEURAL OR PERITONEAL FLUID: Albumin, Fluid: 2.3 g/dL

## 2017-09-19 LAB — PROTIME-INR
INR: 1.07
Prothrombin Time: 13.8 seconds (ref 11.4–15.2)

## 2017-09-19 LAB — PROTEIN, PLEURAL OR PERITONEAL FLUID: Total protein, fluid: 3.9 g/dL

## 2017-09-19 LAB — BODY FLUID CELL COUNT WITH DIFFERENTIAL
Eos, Fluid: 59 %
Lymphs, Fluid: 27 %
Monocyte-Macrophage-Serous Fluid: 4 %
Neutrophil Count, Fluid: 10 %
Total Nucleated Cell Count, Fluid: 3661 cu mm

## 2017-09-19 LAB — GLUCOSE, PLEURAL OR PERITONEAL FLUID: Glucose, Fluid: 106 mg/dL

## 2017-09-19 LAB — CBC
HCT: 38.8 % — ABNORMAL LOW (ref 40.0–52.0)
Hemoglobin: 12.7 g/dL — ABNORMAL LOW (ref 13.0–18.0)
MCH: 30 pg (ref 26.0–34.0)
MCHC: 32.6 g/dL (ref 32.0–36.0)
MCV: 91.8 fL (ref 80.0–100.0)
Platelets: 174 10*3/uL (ref 150–440)
RBC: 4.23 MIL/uL — ABNORMAL LOW (ref 4.40–5.90)
RDW: 15 % — ABNORMAL HIGH (ref 11.5–14.5)
WBC: 8.5 10*3/uL (ref 3.8–10.6)

## 2017-09-19 LAB — AMYLASE, PLEURAL OR PERITONEAL FLUID: Amylase, Fluid: <5 U/L

## 2017-09-19 LAB — LACTATE DEHYDROGENASE, PLEURAL OR PERITONEAL FLUID: LD, Fluid: 462 U/L — ABNORMAL HIGH (ref 3–23)

## 2017-09-19 MED ORDER — ADULT MULTIVITAMIN W/MINERALS CH
1.0000 | ORAL_TABLET | Freq: Every day | ORAL | Status: DC
  Administered 2017-09-19 – 2017-09-21 (×3): 1 via ORAL
  Filled 2017-09-19 (×3): qty 1

## 2017-09-19 MED ORDER — APIXABAN 5 MG PO TABS
5.0000 mg | ORAL_TABLET | Freq: Two times a day (BID) | ORAL | Status: DC
  Administered 2017-09-19 – 2017-09-21 (×4): 5 mg via ORAL
  Filled 2017-09-19 (×5): qty 1

## 2017-09-19 MED ORDER — PREMIER PROTEIN SHAKE
11.0000 [oz_av] | Freq: Two times a day (BID) | ORAL | Status: DC
  Administered 2017-09-19 – 2017-09-21 (×4): 11 [oz_av] via ORAL

## 2017-09-19 NOTE — Progress Notes (Signed)
Back from ultrasound, dressing dry and intact to rt back.  No distress on 4LO2 per 

## 2017-09-19 NOTE — Telephone Encounter (Signed)
Patients wife states to disregard previous message because they found out he has fluid on the outside of his lungs which was causing his breathing problems.

## 2017-09-19 NOTE — Telephone Encounter (Signed)
Noted  

## 2017-09-19 NOTE — Progress Notes (Signed)
[facility] at [place] NAME: Andre [name]    MR#:  [id number]  DATE OF BIRTH:  [birth year]  SUBJECTIVE: 82 year old male admitted because of persistent shortness of breath, right pleural effusion, patient had thoracocentesis this afternoon, 1 L of fluid removed from the right lung.  CHIEF COMPLAINT:   Chief Complaint  Patient presents with  . Shortness of Breath  Patient is feeling better, less shortness of breath,  REVIEW OF SYSTEMS:   ROS CONSTITUTIONAL: No fever, fatigue or weakness.  EYES: No blurred or double vision.  EARS, NOSE, AND THROAT: No tinnitus or ear pain.  RESPIRATORY:  less short of breath today.  CARDIOVASCULAR: No chest pain, orthopnea, edema.  GASTROINTESTINAL: No nausea, vomiting, diarrhea or abdominal pain.  GENITOURINARY: No dysuria, hematuria.  ENDOCRINE: No polyuria, nocturia,  HEMATOLOGY: No anemia, easy bruising or bleeding SKIN: No rash or lesion. MUSCULOSKELETAL: No joint pain or arthritis.   NEUROLOGIC: No tingling, numbness, weakness.  PSYCHIATRY: No anxiety or depression.   DRUG ALLERGIES:   Allergies  Allergen Reactions  . Pravastatin Other (See Comments)  . Prednisone Other (See Comments)    Pt states that med makes him hyper Pt states that med makes him hyper    VITALS:  Blood pressure (!) 119/56, pulse 63, temperature 97.7 F (36.5 C), temperature source Oral, resp. rate 16, height 5\' 7"  (1.702 m), weight 113.1 kg (249 lb 6.4 oz), SpO2 99 %.  PHYSICAL EXAMINATION:  GENERAL:  82 y.o.-year-old patient lying in the bed with no acute distress.  EYES: Pupils equal, round, reactive to light and accommodation. No scleral icterus. Extraocular muscles intact.  HEENT: Head atraumatic, normocephalic. Oropharynx and nasopharynx clear.  NECK:  Supple, no jugular venous distention. No thyroid enlargement, no tenderness.  LUNGS: Decreased breath sounds bilaterally, no wheezing. CARDIOVASCULAR: S1, S2  normal. No murmurs, rubs, or gallops.  ABDOMEN: Soft, nontender, nondistended. Bowel sounds present. No organomegaly or mass.  EXTREMITIES: No pedal edema, cyanosis, or clubbing.  NEUROLOGIC: Cranial nerves II through XII are intact. Muscle strength 5/5 in all extremities. Sensation intact. Gait not checked.  PSYCHIATRIC: The patient is alert and oriented x 3.  SKIN: No obvious rash, lesion, or ulcer.    LABORATORY PANEL:   CBC Recent Labs  Lab 09/19/17 0433  WBC 8.5  HGB 12.7*  HCT 38.8*  PLT 174   ------------------------------------------------------------------------------------------------------------------  Chemistries  Recent Labs  Lab 09/19/17 0433  NA 140  K 3.8  CL 101  CO2 28  GLUCOSE 125*  BUN 12  CREATININE 1.02  CALCIUM 8.6*   ------------------------------------------------------------------------------------------------------------------  Cardiac Enzymes No results for input(s): TROPONINI in the last 168 hours. ------------------------------------------------------------------------------------------------------------------  RADIOLOGY:  Dg Chest 1 View  Result Date: 09/19/2017 CLINICAL DATA:  Status post right thoracentesis today. EXAM: CHEST  1 VIEW COMPARISON:  PA and lateral chest 11/18/2017. FINDINGS: Right pleural effusion is decreased after thoracentesis. No pneumothorax. Bibasilar atelectasis is seen. There is cardiomegaly. IMPRESSION: Decreased right pleural effusion after thoracentesis. Negative for pneumothorax. No other change. Electronically Signed   By: Inge Rise M.D.   On: 09/19/2017 10:16   Dg Chest 2 View  Result Date: 09/18/2017 CLINICAL DATA:  Shortness of breath, weakness EXAM: CHEST - 2 VIEW COMPARISON:  09/05/2017. FINDINGS: Large right pleural effusion is increased since prior study. Right lower lobe atelectasis. Cardiomegaly. Prior CABG. No confluent opacity on the left. No left effusion. No acute bony abnormality. IMPRESSION:  Enlarging right pleural effusion, now large  with increasing right lower lobe atelectasis. Cardiomegaly. Electronically Signed   By: Rolm Baptise M.D.   On: 09/18/2017 13:14   US Thoracentesis Asp Pleural Space W/img Guide  Result Date: 09/19/2017 INDICATION: Right pleural effusion EXAM: ULTRASOUND GUIDED RIGHT THORACENTESIS MEDICATIONS: None. COMPLICATIONS: None immediate. PROCEDURE: An ultrasound guided thoracentesis was thoroughly discussed with the patient and questions answered. The benefits, risks, alternatives and complications were also discussed. The patient understands and wishes to proceed with the procedure. Written consent was obtained. Ultrasound was performed to localize and mark an adequate pocket of fluid in the right chest. The area was then prepped and draped in the normal sterile fashion. 1% Lidocaine was used for local anesthesia. Under ultrasound guidance a 6 Fr Safe-T-Centesis catheter was introduced. Thoracentesis was performed. The catheter was removed and a dressing applied. FINDINGS: A total of approximately 1 L of sanguinous fluid was removed. Samples were sent to the laboratory as requested by the clinical team. IMPRESSION: Successful ultrasound guided right thoracentesis yielding 1 L of pleural fluid. Electronically Signed   By: Inez Catalina M.D.   On: 09/19/2017 10:49    EKG:   Orders placed or performed during the hospital encounter of 09/18/17  . ED EKG  . ED EKG    ASSESSMENT AND PLAN:   #1 acute respiratory distress secondary to right pleural effusion: Status post thoracocentesis, 1 L of fluid removed from the right lung, follow cultures, cytology.  Patient most likely has worsening acute on chronic diastolic heart failure and needing high-dose Lasix. 2.  History of CAD, patient had right and left heart cath by Dr. Fletcher Anon yesterday, patient has three-vessel disease, patent stent, cardiac cath did not change from before.  No new changes.  Restarted on apixaban  today, 3.  Acute on chronic diastolic heart failure, patient is on IV Lasix, patient wife told me that he  was taking only 20 mg of Lasix every day.,  Patient will need higher dose of Lasix at discharge.  Continue Coreg,  Statins.  Patient now on Lasix 60 mg every 12 hours, 4.  Parkinson disease: Stable, continue his Sinemet. #5 overactive bladder, patient is on Myrbetriq, patient recently saw Dr. Altamese Challis. 6.  History of COPD, chronic respiratory failure,: Patient followed by Dr. Raul Del.  Continue his bronchodilators. All the records are reviewed and case discussed with Care Management/Social Workerr. Management plans discussed with the patient, family and they are in agreement. Plan: CODE STATUS: Full code  TOTAL TIME TAKING CARE OF THIS PATIENT ;25minutes.   POSSIBLE D/C IN 1-2 DAYS, DEPENDING ON CLINICAL CONDITION.  More than 50% of the time spent in counseling, coordination of care. D/w wife,reviewed his charts from care everywhere.  Epifanio Lesches M.D on 09/19/2017 at 5:15 PM  Between 7am to 6pm - Pager - [phone]  After 6pm go to [email or web address] - password [email or web address] [facility]  [place] [facility]  Office  [phone]  CC: Primary care physician; [name], MD   Note: This dictation was prepared with Dragon dictation along with smaller phrase technology. Any transcriptional errors that result from this process are unintentional.

## 2017-09-19 NOTE — Progress Notes (Signed)
Pt requesting that the bed/chair alarm be left off during day time hours.  Explained to pt risks of getting oob by self. Will monitor.

## 2017-09-19 NOTE — Progress Notes (Signed)
To ultrasound via bed 

## 2017-09-19 NOTE — Procedures (Signed)
Right thoracentesis without difficulty  Complications:  None  Blood Loss: none  See dictation in canopy pacs   

## 2017-09-19 NOTE — Plan of Care (Signed)
  Problem: Clinical Measurements: Goal: Will remain free from infection Outcome: Progressing Note:  Remains  afebrile Goal: Diagnostic test results will improve Outcome: Progressing Note:  Labs normal this am

## 2017-09-19 NOTE — Progress Notes (Signed)
Initial Nutrition Assessment  DOCUMENTATION CODES:   Obesity unspecified  INTERVENTION:   Premier Protein BID, each supplement provides 160 kcal and 30 grams of protein.   MVI daily  NUTRITION DIAGNOSIS:   Increased nutrient needs related to catabolic illness(COPD, pulmonary fibrosis, CHF) as evidenced by increased estimated needs.  GOAL:   Patient will meet greater than or equal to 90% of their needs  MONITOR:   PO intake, Supplement acceptance, Labs, Weight trends, I & O's  REASON FOR ASSESSMENT:   Malnutrition Screening Tool    ASSESSMENT:   82 y.o. male with a known history of chronic diastolic heart failure, coronary artery disease, GERD, hyperlipidemia, hypertension, paroxysmal atrial fibrillation, Parkinson's disease, pulmonary embolism on Eliquis for anticoagulation presented to the emergency room for shortness of breath.  Patient had cardiac catheterization 6/3 by Dr. Fletcher Anon.  After the procedure patient felt short of breath.  He came to the emergency room for the further evaluation.  Imaging studies in the emergency room showed a large right pleural effusion.   Unable to speak with pt as pt short of breath and with nursing staff. Pt familiar to nutrition service from previous admits. Patient reported on last admit in May that he had had a decreased appetite for about one month r/t difficulty breathing. He reported trying to eat 2-3 meals per day but said that he was only finishing 25% of meals. His appetite had started to improve prior to discharge and he was eating 50% of his meals. Patient reported a UBW of ~272 lbs. Per chart he was 271.5 lbs on 07/24/2017 and 253.6 lbs on 08/21/2017 and today 249lbs; this is a 10lb(4%) wt loss over the past month. Pt NPO this morning for thoracentesis; now s/p with 1L fluid removal. Pt initiated on heart healthy diet today. RD will obtain recent nutrition related history at follow up. RD will order supplements and MVI to help pt meet his  estimated needs.   Medications reviewed and include: lasix, synthroid, protonix, KCl  Labs reviewed:   NUTRITION - FOCUSED PHYSICAL EXAM:    Most Recent Value  Orbital Region  No depletion  Upper Arm Region  No depletion  Thoracic and Lumbar Region  No depletion  Buccal Region  No depletion  Temple Region  No depletion  Clavicle Bone Region  No depletion  Clavicle and Acromion Bone Region  No depletion  Scapular Bone Region  No depletion  Dorsal Hand  No depletion  Patellar Region  No depletion  Anterior Thigh Region  No depletion  Posterior Calf Region  No depletion  Edema (RD Assessment)  Mild  Hair  Reviewed  Eyes  Reviewed  Mouth  Reviewed  Skin  Reviewed  Nails  Reviewed     Diet Order:   Diet Order           Diet Heart Room service appropriate? Yes; Fluid consistency: Thin  Diet effective now         EDUCATION NEEDS:   No education needs have been identified at this time  Skin:  Skin Assessment: Reviewed RN Assessment  Last BM:  6/2  Height:   Ht Readings from Last 1 Encounters:  09/18/17 5\' 7"  (1.702 m)    Weight:   Wt Readings from Last 1 Encounters:  09/18/17 249 lb 6.4 oz (113.1 kg)    Ideal Body Weight:  67 kg  BMI:  Body mass index is 39.06 kg/m.  Estimated Nutritional Needs:   Kcal:  2000-2300kcal/day  Protein:  115-130g/day   Fluid:  per MD  Koleen Distance MS, RD, LDN Pager #- [phone] Office#- [phone] After Hours Pager: [phone]

## 2017-09-20 DIAGNOSIS — R0603 Acute respiratory distress: Secondary | ICD-10-CM

## 2017-09-20 DIAGNOSIS — J90 Pleural effusion, not elsewhere classified: Secondary | ICD-10-CM

## 2017-09-20 DIAGNOSIS — I25118 Atherosclerotic heart disease of native coronary artery with other forms of angina pectoris: Secondary | ICD-10-CM

## 2017-09-20 DIAGNOSIS — J8410 Pulmonary fibrosis, unspecified: Principal | ICD-10-CM

## 2017-09-20 LAB — BASIC METABOLIC PANEL
Anion gap: 12 (ref 5–15)
BUN: 20 mg/dL (ref 6–20)
CO2: 26 mmol/L (ref 22–32)
Calcium: 8.7 mg/dL — ABNORMAL LOW (ref 8.9–10.3)
Chloride: 97 mmol/L — ABNORMAL LOW (ref 101–111)
Creatinine, Ser: 1.02 mg/dL (ref 0.61–1.24)
GFR calc Af Amer: >60 mL/min (ref >60)
GFR calc non Af Amer: >60 mL/min (ref >60)
Glucose, Bld: 151 mg/dL — ABNORMAL HIGH (ref 65–99)
Potassium: 4 mmol/L (ref 3.5–5.1)
Sodium: 135 mmol/L (ref 135–145)

## 2017-09-20 LAB — CYTOLOGY - NON PAP

## 2017-09-20 LAB — LACTATE DEHYDROGENASE: LDH: 157 U/L (ref 98–192)

## 2017-09-20 MED ORDER — FUROSEMIDE 40 MG PO TABS
40.0000 mg | ORAL_TABLET | Freq: Two times a day (BID) | ORAL | Status: DC
  Administered 2017-09-20 – 2017-09-21 (×2): 40 mg via ORAL
  Filled 2017-09-20 (×2): qty 1

## 2017-09-20 NOTE — Progress Notes (Signed)
[facility] NAME: Andre [name]    MR#:  [id number]  DATE OF BIRTH:  10-13-1935  SUBJECTIVE:   Pt. Here due to shortness of breath and noted to be in mild CHF.  Patient is status post right-sided thoracentesis with 1 L of fluid removed yesterday.  Shortness of breath has improved.  [name] is at bedside.  Seen by cardiology and plan to continue oral diuretics for 1 more day prior to discharge.  Patient ambulated today and was not hypoxic.  REVIEW OF SYSTEMS:    Review of Systems  Constitutional: Negative for chills and fever.  HENT: Negative for congestion and tinnitus.   Eyes: Negative for blurred vision and double vision.  Respiratory: Positive for shortness of breath. Negative for cough and wheezing.   Cardiovascular: Negative for chest pain, orthopnea and PND.  Gastrointestinal: Negative for abdominal pain, diarrhea, nausea and vomiting.  Genitourinary: Negative for dysuria and hematuria.  Neurological: Negative for dizziness, sensory change and focal weakness.  All other systems reviewed and are negative.   Nutrition: Heart Healthy Tolerating Diet: Yes Tolerating PT: Ambulatory   DRUG ALLERGIES:   Allergies  Allergen Reactions  . Pravastatin Other (See Comments)  . Prednisone Other (See Comments)    Pt states that med makes him hyper Pt states that med makes him hyper    VITALS:  Blood pressure 121/67, pulse 69, temperature (!) 97.5 F (36.4 C), temperature source Oral, resp. rate 14, height 5\' 7"  (1.702 m), weight 111.4 kg (245 lb 11.2 oz), SpO2 92 %.  PHYSICAL EXAMINATION:   Physical Exam  GENERAL:  82 y.o.-year-old obese patient sitting up in chair in no acute distress.  EYES: Pupils equal, round, reactive to light and accommodation. No scleral icterus. Extraocular muscles intact.  HEENT: Head atraumatic, normocephalic. Oropharynx and nasopharynx clear.  NECK:  Supple, no jugular venous distention. No  thyroid enlargement, no tenderness.  LUNGS: Normal breath sounds bilaterally, no wheezing, rales, rhonchi. No use of accessory muscles of respiration.  CARDIOVASCULAR: S1, S2 normal. No murmurs, rubs, or gallops.  ABDOMEN: Soft, nontender, nondistended. Bowel sounds present. No organomegaly or mass.  EXTREMITIES: No cyanosis, clubbing, +1 edema b/l NEUROLOGIC: Cranial nerves II through XII are intact. No focal Motor or sensory deficits b/l.   PSYCHIATRIC: The patient is alert and oriented x 3.  SKIN: No obvious rash, lesion, or ulcer.    LABORATORY PANEL:   CBC Recent Labs  Lab 09/19/17 0433  WBC 8.5  HGB 12.7*  HCT 38.8*  PLT 174   ------------------------------------------------------------------------------------------------------------------  Chemistries  Recent Labs  Lab 09/20/17 1230  NA 135  K 4.0  CL 97*  CO2 26  GLUCOSE 151*  BUN 20  CREATININE 1.02  CALCIUM 8.7*   ------------------------------------------------------------------------------------------------------------------  Cardiac Enzymes No results for input(s): TROPONINI in the last 168 hours. ------------------------------------------------------------------------------------------------------------------  RADIOLOGY:  Dg Chest 1 View  Result Date: 09/19/2017 CLINICAL DATA:  Status post right thoracentesis today. EXAM: CHEST  1 VIEW COMPARISON:  PA and lateral chest 11/18/2017. FINDINGS: Right pleural effusion is decreased after thoracentesis. No pneumothorax. Bibasilar atelectasis is seen. There is cardiomegaly. IMPRESSION: Decreased right pleural effusion after thoracentesis. Negative for pneumothorax. No other change. Electronically Signed   By: Inge Rise M.D.   On: 09/19/2017 10:16   US Thoracentesis Asp Pleural Space W/img Guide  Result Date: 09/19/2017 INDICATION: Right pleural effusion EXAM: ULTRASOUND GUIDED RIGHT THORACENTESIS MEDICATIONS: None. COMPLICATIONS: None immediate. PROCEDURE:  An ultrasound  guided thoracentesis was thoroughly discussed with the patient and questions answered. The benefits, risks, alternatives and complications were also discussed. The patient understands and wishes to proceed with the procedure. Written consent was obtained. Ultrasound was performed to localize and mark an adequate pocket of fluid in the right chest. The area was then prepped and draped in the normal sterile fashion. 1% Lidocaine was used for local anesthesia. Under ultrasound guidance a 6 Fr Safe-T-Centesis catheter was introduced. Thoracentesis was performed. The catheter was removed and a dressing applied. FINDINGS: A total of approximately 1 L of sanguinous fluid was removed. Samples were sent to the laboratory as requested by the clinical team. IMPRESSION: Successful ultrasound guided right thoracentesis yielding 1 L of pleural fluid. Electronically Signed   By: Inez Catalina M.D.   On: 09/19/2017 10:49     ASSESSMENT AND PLAN:   82 year old male with past medical history of active sleep apnea, Parkinson's disease, paroxysmal atrial fibrillation, hypertension, hyperlipidemia, history of coronary artery disease, chronic diastolic CHF who presented to the hospital due to shortness of breath.  1.  Acute respiratory failure with hypoxia-multifactorial nature related to possible underlying interstitial lung disease, sleep apnea, mild CHF.  Patient diuresed with IV Lasix and has improved.  He was ambulated today and was not hypoxic. - Patient is status post ultrasound-guided thoracentesis with 1 L of fluid removed yesterday. -Wean O2 as tolerated. -Patient will need referral to pulmonary as an outpatient work-up possible underlying interstitial lung disease  2.  CHF-acute on chronic diastolic dysfunction. -Improved with IV diuresis.  Appreciate cardiology input.  Switch to oral Lasix today. -Continue carvedilol, losartan.  Status post ultrasound-guided thoracentesis with 1 L fluid  removed.  3.  History of chronic atrial fibrillation-rate controlled.  Continue carvedilol, continue Eliquis.  4.  History of Parkinson's disease-continue Sinemet.  5.  Hypothyroidism-continue Synthroid.  6.  Essential hypertension-continue carvedilol, Imdur, losartan.  7.  History of urinary incontinence-continue Myrbetriq.  8.  Hyperlipidemia-continue Crestor.  9. GERD - cont. Protonix.   All the records are reviewed and case discussed with Care Management/Social Worker. Management plans discussed with the patient, family and they are in agreement.  CODE STATUS: Full code  DVT Prophylaxis: Eliquis  TOTAL TIME TAKING CARE OF THIS PATIENT: 30 minutes.   POSSIBLE D/C IN 1-2 DAYS, DEPENDING ON CLINICAL CONDITION.   Henreitta Leber M.D on 09/20/2017 at 2:32 PM  Between 7am to 6pm - Pager - [phone]  After 6pm go to [email or web address] - [employer] [facility]  Office  [phone]  CC: Primary care physician; [name], MD

## 2017-09-20 NOTE — Consult Note (Signed)
Cardiology Consultation:   Patient ID: Andre [name]; [id number]; [birth year]   Admit date: 09/18/2017 Date of Consult: 09/20/2017  Primary Care Provider: [name], MD Primary Cardiologist: Fletcher Anon   Patient Profile:   Andre [name] is a 82 y.o. male with a hx of CAD s/p 1-vessel CABG in 9242, chronic diastolic CHF, PAF on Eliquis, prior PE, interstitial lung disease, Parkinson's disease, COPD secondary to prior tobacco abuse, HTN, HLD, morbid obesity, and sleep apnea who is being seen today for the evaluation of SOB at the request of Dr. Verdell Carmine.  History of Present Illness:   [name] underwent LHC in 10/2016 that showed an occluded native LAD, patent LIMA to the LAD, and severe proximal RCA stenosis with heavy thrombosis and moderate distal left main stenosis with significant stenosis in the ramus branch.  EF was 50 to 55%.  He underwent successful PCI drug-eluting stent placement to the right coronary artery which is completed by embolization into a large RV branch at the lesion site, which was treated with PTCA and established TIMI I flow.  He says that ever since then, he has had dyspnea on exertion and over the past 10 months, has required titration of diuretics.  CT of the chest earlier this year showed early pulmonary fibrosis and COPD and as result, amiodarone was discontinued.  He was admitted in April 2019 with acute respiratory failure and hypoxia felt to be secondary to COPD, pneumonia, and pulmonary fibrosis.  He was treated and subsequently discharged on oxygen.  Unfortunately, symptoms never really fully improved and subsequently worsened, prompting readmission on May 11.  He reported right-sided pleuritic chest pain at that time.  Chest x-ray showed mild interstitial edema with patchy consolidation of the right lung base and right pleural effusion.  He was treated with antibiotics and diuresed.  Discharge weight was 257 pounds. In follow up on 5/21 he continued to note  somnolence and fatigue. Initially, there were plans to set him up with PT and outpatient diuresis; however, he continued to note increased SOB prompting R/LHC on 09/18/2017 that showed significant underlying 3-vessel CAD with a patent LIMA-LAD and patent RCA stent. There was significant ostial disease in the ramus branch with diffuse disease proximally as well as ostial disease in the D1. These were unchanged from [facility] in 10/2016. His RHC showed mildly elevated filling pressures, minimal pulmonary hypertension, and normal cardiac output. These findings did not explain his severe exertional dyspnea. Medical management was advised of his ramus and D1 disease given they were unchanged from prior and any PCI on these lesions would require jailing the LCx making the risks outweigh any benefit. It was recommended pulmonology see the patient for further evaluation of his dyspnea. Patient was admitted following this R/LHC on 6/3 given persistent SOB. He was noted to have a right pleural effusion. He was placed on IV Lasix without much improvement in his dyspnea. He underwent right-sided thoracentesis on 6/4 with aspiration of 1 L. Cytology is pending. Patient reports since having his thoracentesis his breathing is "100% better." Now on room air with oxygen saturations in the low 90% with ambulation oxygen saturations have been 89%. Renal function from 6/4 shows a slight up trending of his BUN and SCr. Labs are pending this morning.   Past Medical History:  Diagnosis Date  . Cervical spondylosis 10/01/2013  . Chronic diastolic CHF (congestive heart failure) ([place])    a. 07/2016 Echo: >55%; b. 10/2016 Echo: EF 55-60%, Gr1 DD,  Ao sclerosis w/o stenosis, sev dil LA; c. 08/2017 Echo: EF 60-65%, no rwma, Gr2 DD, mild AS, sev dil LA/RA.  [name] Coronary artery disease    a. 1998 s/p mini-cabg @ [facility] - LIMA->LAD;  b. 07/2016 St Echo:  Inadequate HR (max 97) w/ hypertensive response (220/96). Ex time only 2:54 - stopped due to dyspnea and  leg pain;  c.  08/2016 MV: EF 67%, no ischemia, low risk; d. 10/2016 NSTEMI/Cath: LM 40, LAD 100ost, RI 80, LCX nl, RCA 95p (4.0x26 Onyx DES), 26m, LIMA->LAD nl, EF 50-55%.  . Depression   . GERD (gastroesophageal reflux disease)   . Hyperlipidemia   . Hypertension   . PAF (paroxysmal atrial fibrillation) ([facility])    a. s/p DCCV-->maintaining sinus on amiodarone;  b. CHA2DS2VASc = 5-->eliquis.  . Parkinson's disease ([place])    tremors  . Pulmonary embolism ([place]) 2011  . 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 Sleep apnea    wears CPAP    Past Surgical History:  Procedure Laterality Date  . BACK SURGERY  1960  . CARDIAC CATHETERIZATION    . CHOLECYSTECTOMY  2010  . CORONARY ARTERY BYPASS GRAFT  01/07/1997  . CORONARY STENT INTERVENTION N/A 10/31/2016   Procedure: Coronary Stent Intervention;  Surgeon: Wellington Hampshire, MD;  Location: [place] [facility];  Service: Cardiovascular;  Laterality: N/A;  . ELECTROPHYSIOLOGIC STUDY N/A 07/14/2015   Procedure: CARDIOVERSION;  Surgeon: Yolonda Kida, MD;  Location: [facility] ORS;  Service: Cardiovascular;  Laterality: N/A;  . ELECTROPHYSIOLOGIC STUDY N/A 10/12/2015   Procedure: CARDIOVERSION;  Surgeon: Minna Merritts, MD;  Location: [facility] ORS;  Service: Cardiovascular;  Laterality: N/A;  . LEFT HEART CATH AND CORONARY ANGIOGRAPHY N/A 10/31/2016   Procedure: Left Heart Cath and Coronary Angiography;  Surgeon: Wellington Hampshire, MD;  Location: [place] [facility];  Service: Cardiovascular;  Laterality: N/A;  . OTHER SURGICAL HISTORY  1998   Bypass  . RIGHT/LEFT HEART CATH AND CORONARY ANGIOGRAPHY N/A 09/18/2017   Procedure: RIGHT/LEFT HEART CATH AND CORONARY ANGIOGRAPHY;  Surgeon: Wellington Hampshire, MD;  Location: [place] [facility];  Service: Cardiovascular;  Laterality: N/A;     Home Meds: Prior to Admission medications   Medication Sig Start Date End Date Taking? Authorizing Provider  acetaminophen (TYLENOL) 500 MG tablet Take 1,000 mg by mouth  every 6 (six) hours as needed for mild pain or moderate pain.    Yes [provider]  albuterol (PROVENTIL) (2.5 MG/3ML) 0.083% nebulizer solution Take 3 mLs (2.5 mg total) by nebulization every 4 (four) hours as needed for wheezing or shortness of breath. Patient taking differently: Take 2.5 mg by nebulization 2 (two) times daily.  08/24/17  Yes Kasa, Maretta Bees, MD  carbidopa-levodopa (SINEMET) 25-250 MG tablet Take 2 tablets by mouth 3 (three) times daily. 02/01/17  Yes Kathrynn Ducking, MD  carvedilol (COREG) 6.25 MG tablet TAKE ONE TABLET BY MOUTH TWICE DAILY WITH A MEAL 07/24/17  Yes Wellington Hampshire, MD  clopidogrel (PLAVIX) 75 MG tablet TAKE ONE TABLET BY MOUTH EVERY DAY WITH BREAKFAST 07/06/17  Yes Wellington Hampshire, MD  cyclobenzaprine (FLEXERIL) 5 MG tablet Take 5 mg by mouth daily as needed for muscle spasms.    Yes [provider]  ELIQUIS 5 MG TABS tablet TAKE ONE TABLET TWICE DAILY 02/06/17  Yes [name] A, MD  EPINEPHrine 0.3 mg/0.3 mL IJ SOAJ injection Inject 0.3 mg as directed once as needed (allergic reaction). Reported on 10/27/2015 07/01/13  Yes [provider]  escitalopram (LEXAPRO) 10 MG tablet Take 1 tablet (10 mg total) by mouth daily. 09/12/17  Yes [name], MD  esomeprazole (NEXIUM) 20 MG capsule Take 40 mg by mouth daily at 12 noon.   Yes [provider]  fluticasone (FLONASE) 50 MCG/ACT nasal spray TAKE 2 PUFFS IN EACH NOSTRIL EVERY DAY 10/18/16  Yes Cook, Jayce G, DO  furosemide (LASIX) 20 MG tablet Take 2 tablets (40 mg total) by mouth daily. 09/06/17  Yes Wellington Hampshire, MD  isosorbide mononitrate (IMDUR) 60 MG 24 hr tablet Take 1.5 tablets (90 mg total) by mouth daily. Patient taking differently: Take 60 mg by mouth daily.  08/28/17  Yes Fritzi Mandes, MD  levothyroxine (SYNTHROID, LEVOTHROID) 50 MCG tablet Take 50 mcg by mouth daily before lunch.  09/07/16  Yes [provider]  losartan (COZAAR) 100 MG tablet Take 1  tablet (100 mg total) by mouth daily. 02/14/17  Yes Wellington Hampshire, MD  mirabegron ER (MYRBETRIQ) 50 MG TB24 tablet Take 50 mg by mouth daily.   Yes [provider]  Polyethyl Glycol-Propyl Glycol (SYSTANE OP) Place 1 drop into both eyes 2 (two) times daily as needed (dry eyes).   Yes [provider]  potassium chloride SA (K-DUR,KLOR-CON) 20 MEQ tablet Take 1 tablet (20 mEq total) by mouth daily. 09/06/17  Yes Wellington Hampshire, MD  rosuvastatin (CRESTOR) 10 MG tablet Take 10 mg by mouth at bedtime.   Yes [provider]  VENTOLIN HFA 108 (90 Base) MCG/ACT inhaler TAKE 2 PUFFS EVERY 8 HOURS AS NEEDED FORWHEEZING 12/02/16  Yes Coral Spikes, DO    Inpatient Medications: Scheduled Meds: . apixaban  5 mg Oral BID  . carbidopa-levodopa  2 tablet Oral TID  . carvedilol  6.25 mg Oral BID WC  . escitalopram  10 mg Oral Daily  . fluticasone  1 spray Each Nare Daily  . furosemide  60 mg Intravenous Q12H  . isosorbide mononitrate  60 mg Oral Daily  . levothyroxine  50 mcg Oral Q1200  . losartan  100 mg Oral Daily  . mouth rinse  15 mL Mouth Rinse BID  . mirabegron ER  50 mg Oral Daily  . multivitamin with minerals  1 tablet Oral Daily  . pantoprazole  40 mg Oral Daily  . potassium chloride SA  20 mEq Oral Daily  . protein supplement shake  11 oz Oral BID BM  . rosuvastatin  10 mg Oral QHS  . sodium chloride flush  3 mL Intravenous Q12H  . sodium chloride flush  3 mL Intravenous Q12H  . sodium chloride flush  3 mL Intravenous Q12H   Continuous Infusions: . sodium chloride     PRN Meds: sodium chloride, acetaminophen **OR** acetaminophen, albuterol, cyclobenzaprine, HYDROcodone-acetaminophen, ondansetron **OR** ondansetron (ZOFRAN) IV, polyvinyl alcohol, senna-docusate, sodium chloride flush  Allergies:   Allergies  Allergen Reactions  . Pravastatin Other (See Comments)  . Prednisone Other (See Comments)    Pt states that med makes him hyper Pt states that  med makes him hyper    Social History:   Social History   Socioeconomic History  . Marital status: Married    Spouse name: [name]  . Number of children: 1  . Years of education: 66  . Highest education level: Not on file  Occupational History  . Occupation: Retired    Comment: Designer, television/film set  Social Needs  . Financial resource strain: Not on file  . Food insecurity:  Worry: Not on file    Inability: Not on file  . Transportation needs:    Medical: Not on file    Non-medical: Not on file  Tobacco Use  . Smoking status: Former Smoker    Packs/day: 1.00    Years: 40.00    Pack years: 40.00    Types: Cigarettes, Pipe, Cigars    Last attempt to quit: 04/18/1972    Years since quitting: 45.4  . Smokeless tobacco: Former Systems developer    Types: [place] date: 04/18/1972  Substance and Sexual Activity  . Alcohol use: Yes    Alcohol/week: 2.4 oz    Types: 2 Cans of beer, 2 Shots of liquor per week    Comment: per 2 weeks   . Drug use: No  . Sexual activity: Yes  Lifestyle  . Physical activity:    Days per week: Not on file    Minutes per session: Not on file  . Stress: Not on file  Relationships  . Social connections:    Talks on phone: Not on file    Gets together: Not on file    Attends religious service: Not on file    Active member of club or organization: Not on file    Attends meetings of clubs or organizations: Not on file    Relationship status: Not on file  . Intimate partner violence:    Fear of current or ex partner: Not on file    Emotionally abused: Not on file    Physically abused: Not on file    Forced sexual activity: Not on file  Other Topics Concern  . Not on file  Social History Narrative   Lives w/ wife   Caffeine use: none   Right-handed     Family History:  Family History  Problem Relation Age of Onset  . Alcohol abuse Father     ROS:  Review of Systems  Constitutional: Positive for malaise/fatigue. Negative for chills,  diaphoresis, fever and weight loss.  HENT: Negative for congestion.   Eyes: Negative for discharge and redness.  Respiratory: Positive for cough, shortness of breath and wheezing. Negative for hemoptysis and sputum production.   Cardiovascular: Positive for orthopnea. Negative for chest pain, palpitations, claudication, leg swelling and PND.  Gastrointestinal: Negative for abdominal pain, blood in stool, heartburn, melena, nausea and vomiting.  Genitourinary: Negative for hematuria.  Musculoskeletal: Negative for falls and myalgias.  Skin: Negative for rash.  Neurological: Positive for weakness. Negative for dizziness, tingling, tremors, sensory change, speech change, focal weakness and loss of consciousness.  Endo/Heme/Allergies: Does not bruise/bleed easily.  Psychiatric/Behavioral: Negative for substance abuse. The patient is not nervous/anxious.   All other systems reviewed and are negative.     Physical Exam/Data:   Vitals:   09/19/17 1948 09/19/17 2317 09/20/17 0433 09/20/17 0927  BP: (!) 95/52 (!) 96/38 (!) 120/59 121/67  Pulse: 63 63 64 69  Resp:  17 16 14   Temp:   (!) 97.5 F (36.4 C)   TempSrc:   Oral   SpO2: 98% 99% 92% 92%  Weight:      Height:        Intake/Output Summary (Last 24 hours) at 09/20/2017 1200 Last data filed at 09/20/2017 1026 Gross per 24 hour  Intake 360 ml  Output 825 ml  Net -465 ml   Filed Weights   09/18/17 1236 09/18/17 1601  Weight: 250 lb (113.4 kg) 249 lb 6.4 oz (113.1 kg)   Body  mass index is 39.06 kg/m.   Physical Exam: General: Well developed, well nourished, in no acute distress. Head: Normocephalic, atraumatic, sclera non-icteric, no xanthomas, nares without discharge.  Neck: Negative for carotid bruits. JVD not elevated. Lungs: Diminished breath sounds bilaterally with coarse sounds diffusely. Breathing is unlabored. Heart: RRR with S1 S2. No murmurs, rubs, or gallops appreciated. Abdomen: Soft, non-tender, non-distended with  normoactive bowel sounds. No hepatomegaly. No rebound/guarding. No obvious abdominal masses. Msk:  Strength and tone appear normal for age. Extremities: No clubbing or cyanosis. No edema. Distal pedal pulses are 2+ and equal bilaterally. Neuro: Alert and oriented X 3. No facial asymmetry. No focal deficit. Moves all extremities spontaneously. Psych:  Responds to questions appropriately with a normal affect.   EKG:  The EKG was personally reviewed and demonstrates: not in Epic Telemetry:  Telemetry was personally reviewed and demonstrates: NSR  Weights: Filed Weights   09/18/17 1236 09/18/17 1601  Weight: 250 lb (113.4 kg) 249 lb 6.4 oz (113.1 kg)    Relevant CV Studies: [facility] 09/18/2017: Conclusion     LM lesion is 40% stenosed.  Ost LAD lesion is 100% stenosed.  Ost Ramus to Ramus lesion is 80% stenosed.  Ramus lesion is 80% stenosed.  Previously placed Prox RCA to Mid RCA drug eluting stent is widely patent.  Balloon angioplasty was performed.  Mid RCA lesion is 40% stenosed.  LIMA and is normal in caliber.  The graft exhibits no disease.  Dist LM lesion is 30% stenosed.   1.  Significant underlying three-vessel coronary artery disease with patent LIMA to LAD and patent RCA stent.  Significant ostial disease in the ramus branch with diffuse disease proximally as well as ostial disease and first diagonal.  These are unchanged from most recent cardiac catheterization. 2.  Right heart catheterization showed mildly elevated filling pressures, minimal pulmonary hypertension and normal cardiac output.  Recommendations: Current findings do not explain the patient's severe exertional dyspnea.  He does have disease in the ramus branch and first diagonal that is unchanged from before.  Any PCI on this will require jailing the left circumflex and thus risks outweigh the benefits.  Continue same cardiac medications. Resume Eliquis tomorrow if no bleeding  complications. Recommend further pulmonary evaluation of his dyspnea.     Laboratory Data:  Chemistry Recent Labs  Lab 09/18/17 1249 09/19/17 0433  NA 138 140  K 3.6 3.8  CL 100* 101  CO2 25 28  GLUCOSE 125* 125*  BUN 9 12  CREATININE 0.85 1.02  CALCIUM 8.7* 8.6*  GFRNONAA >60 >60  GFRAA >60 >60  ANIONGAP 13 11    No results for input(s): PROT, ALBUMIN, AST, ALT, ALKPHOS, BILITOT in the last 168 hours. Hematology Recent Labs  Lab 09/18/17 1249 09/19/17 0433  WBC 8.2 8.5  RBC 4.47 4.23*  HGB 13.5 12.7*  HCT 40.8 38.8*  MCV 91.4 91.8  MCH 30.2 30.0  MCHC 33.0 32.6  RDW 15.1* 15.0*  PLT 183 174   Cardiac EnzymesNo results for input(s): TROPONINI in the last 168 hours. No results for input(s): TROPIPOC in the last 168 hours.  BNPNo results for input(s): BNP, PROBNP in the last 168 hours.  DDimer No results for input(s): DDIMER in the last 168 hours.  Radiology/Studies:  Dg Chest 1 View  Result Date: 09/19/2017 IMPRESSION: Decreased right pleural effusion after thoracentesis. Negative for pneumothorax. No other change. Electronically Signed   By: Inge Rise M.D.   On: 09/19/2017 10:16   Dg  Chest 2 View  Result Date: 09/18/2017 IMPRESSION: Enlarging right pleural effusion, now large with increasing right lower lobe atelectasis. Cardiomegaly. Electronically Signed   By: Rolm Baptise M.D.   On: 09/18/2017 13:14   US Thoracentesis Asp Pleural Space W/img Guide  Result Date: 09/19/2017 IMPRESSION: Successful ultrasound guided right thoracentesis yielding 1 L of pleural fluid. Electronically Signed   By: Inez Catalina M.D.   On: 09/19/2017 10:49    Assessment and Plan:   1. Acute on chronic respiratory failure with hypoxia: -Likely multifactorial including underlying ILD, recent PNA, OSA, possible OHS, right pleural effusion, and acute on chronic diastolic CHF -Improved following right-sided thoracentesis  -Now on room air -His dyspnea is out of proportion to  his underlying cardiac disease  2. Right pleural effusion: -Status post thoracentesis  -Labs pending -Per IM/PCCM  3. Acute on chronic diastolic CHF: -Transition to PO Lasix 40 mg bid -Monitor UOP and respiratory status  -Continue Coreg and losartan  4. COPD/ILD: -Needs pulmonology evaluation  -Discussed with IM  5. CAD: -Recent LHC as above -Continue medical management  -Eliquis in place of ASA -Crestor -Coreg -Losartan  6. PAF: -Maintaining sinus rhythm -Eliquis -Coreg  7. Morbid obesity/OSA/possible OHS: -CPAP -Likely contributing to his dyspnea    For questions or updates, please contact [place] Please consult [email or web address] for contact info under Cardiology/STEMI.   Signed, Christell Faith, PA-C [place] Pager: [phone] 09/20/2017, 12:00 PM

## 2017-09-20 NOTE — Plan of Care (Signed)
  Problem: Education: Goal: Knowledge of General Education information will improve Outcome: Progressing   Problem: Health Behavior/Discharge Planning: Goal: Ability to manage health-related needs will improve Outcome: Progressing   Problem: Clinical Measurements: Goal: Ability to maintain clinical measurements within normal limits will improve Outcome: Progressing Goal: Will remain free from infection Outcome: Progressing Goal: Diagnostic test results will improve Outcome: Progressing Goal: Respiratory complications will improve Outcome: Progressing Goal: Cardiovascular complication will be avoided Outcome: Progressing   Problem: Safety: Goal: Ability to remain free from injury will improve Outcome: Progressing   Problem: Education: Goal: Ability to demonstrate management of disease process will improve Outcome: Progressing Goal: Ability to verbalize understanding of medication therapies will improve Outcome: Progressing   Problem: Activity: Goal: Capacity to carry out activities will improve Outcome: Progressing   Problem: Cardiac: Goal: Ability to achieve and maintain adequate cardiopulmonary perfusion will improve Outcome: Progressing

## 2017-09-20 NOTE — Progress Notes (Signed)
Date: 09/20/2017,   MRN# [id number] Andre [name] [birth year] Code Status:     Code Status Orders  (From admission, onward)        Start     Ordered   09/18/17 1623  Full code  Continuous     09/18/17 1622    Code Status History    Date Active Date Inactive Code Status Order ID Comments User Context   09/18/2017 1053 09/18/2017 1225 Full Code [id number]  Wellington Hampshire, MD Inpatient   08/26/2017 1123 08/28/2017 2114 Full Code [id number]  Bettey Costa, MD Inpatient   08/11/2017 0409 08/13/2017 0204 Full Code [id number]  Amelia Jo, MD Inpatient   10/28/2016 1219 11/02/2016 1323 Full Code [id number]  Demetrios Loll, MD Inpatient    Advance Directive Documentation     Most Recent Value  Type of Advance Directive  Healthcare Power of [place], Living will  Pre-existing out of facility DNR order (yellow form or pink MOST form)  -  "MOST" Form in Place?  -     Hosp day:@LENGTHOFSTAYDAYS @ Referring MD: @ATDPROV @      CC: sob, right pleural effsion  HPI: this is 82 yr old male. Over weight, awaken from comfortable sleep. Hx of copd, cad, diastalic dysfunction, sleep apnea, obesity, right pleural effusion s/p thoracentesis. Breathing/sob much improved. Pulmonary asked to see regarding copd/pleural effusion. No pleurisy, no hemoptysis. No Trauma. No angina, no fever.   PMHX:   Past Medical History:  Diagnosis Date  . Cervical spondylosis 10/01/2013  . Chronic diastolic CHF (congestive heart failure) ([place])    a. 07/2016 Echo: >55%; b. 10/2016 Echo: EF 55-60%, Gr1 DD, Ao sclerosis w/o stenosis, sev dil LA; c. 08/2017 Echo: EF 60-65%, no rwma, Gr2 DD, mild AS, sev dil LA/RA.  [name] Coronary artery disease    a. 1998 s/p mini-cabg @ [facility] - LIMA->LAD;  b. 07/2016 St Echo:  Inadequate HR (max 97) w/ hypertensive response (220/96). Ex time only 2:54 - stopped due to dyspnea and leg pain;  c.  08/2016 MV: EF 67%, no ischemia, low risk; d. 10/2016 NSTEMI/Cath: LM 40, LAD 100ost, RI 80, LCX nl, RCA 95p (4.0x26 Onyx DES),  21m, LIMA->LAD nl, EF 50-55%.  . Depression   . GERD (gastroesophageal reflux disease)   . Hyperlipidemia   . Hypertension   . PAF (paroxysmal atrial fibrillation) ([facility])    a. s/p DCCV-->maintaining sinus on amiodarone;  b. CHA2DS2VASc = 5-->eliquis.  . Parkinson's disease ([place])    tremors  . Pulmonary embolism ([place]) 2011  . 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 Sleep apnea    wears CPAP   Surgical Hx:  Past Surgical History:  Procedure Laterality Date  . BACK SURGERY  1960  . CARDIAC CATHETERIZATION    . CHOLECYSTECTOMY  2010  . CORONARY ARTERY BYPASS GRAFT  01/07/1997  . CORONARY STENT INTERVENTION N/A 10/31/2016   Procedure: Coronary Stent Intervention;  Surgeon: Wellington Hampshire, MD;  Location: [facility];  Service: Cardiovascular;  Laterality: N/A;  . ELECTROPHYSIOLOGIC STUDY N/A 07/14/2015   Procedure: CARDIOVERSION;  Surgeon: Yolonda Kida, MD;  Location: [facility];  Service: Cardiovascular;  Laterality: N/A;  . ELECTROPHYSIOLOGIC STUDY N/A 10/12/2015   Procedure: CARDIOVERSION;  Surgeon: Minna Merritts, MD;  Location: [facility];  Service: Cardiovascular;  Laterality: N/A;  . LEFT HEART CATH AND CORONARY ANGIOGRAPHY N/A 10/31/2016   Procedure: Left Heart Cath and Coronary Angiography;  Surgeon: Wellington Hampshire, MD;  Location: [facility];  Service: Cardiovascular;  Laterality: N/A;  . OTHER SURGICAL HISTORY  1998   Bypass  . RIGHT/LEFT HEART CATH AND CORONARY ANGIOGRAPHY N/A 09/18/2017   Procedure: RIGHT/LEFT HEART CATH AND CORONARY ANGIOGRAPHY;  Surgeon: Wellington Hampshire, MD;  Location: [facility];  Service: Cardiovascular;  Laterality: N/A;   Family Hx:  Family History  Problem Relation Age of Onset  . Alcohol abuse Father    Social Hx:   Social History   Tobacco Use  . Smoking status: Former Smoker    Packs/day: 1.00    Years: 40.00    Pack years: 40.00    Types: Cigarettes, Pipe, Cigars    Last attempt to quit: 04/18/1972     Years since quitting: 45.4  . Smokeless tobacco: Former Systems developer    Types: Chew    Quit date: 04/18/1972  Substance Use Topics  . Alcohol use: Yes    Alcohol/week: 2.4 oz    Types: 2 Cans of beer, 2 Shots of liquor per week    Comment: per 2 weeks   . Drug use: No   Medication:    Home Medication:    Current Medication: @CURMEDTAB @   Allergies:  Pravastatin and Prednisone  Review of Systems: Gen:  Denies  fever, sweats, chills HEENT: Denies blurred vision, double vision, ear pain, eye pain, hearing loss, nose bleeds, sore throat Cvc:  No dizziness, chest pain or heaviness Resp:  Much less sob post thoracentesis  Gi: Denies swallowing difficulty, stomach pain, nausea or vomiting, diarrhea, constipation, bowel incontinence Gu:  Denies bladder incontinence, burning urine Ext:   No Joint pain, stiffness or swelling Skin: No skin rash, easy bruising or bleeding or hives Endoc:  No polyuria, polydipsia , polyphagia or weight change Psych: No depression, insomnia or hallucinations  Other:  All other systems negative  Physical Examination:   VS: BP 121/67 (BP Location: Right Arm)   Pulse 69   Temp (!) 97.5 F (36.4 C) (Oral)   Resp 14   Ht 5\' 7"  (1.702 m)   Wt 111.4 kg (245 lb 11.2 oz)   SpO2 92%   BMI 38.48 kg/m   General Appearance: No distres, sleeping, waken from sleep.  Neuro: without focal findings, mental status, speech normal, alert and oriented, cranial nerves 2-12 intact, reflexes normal and symmetric, sensation grossly normal  HEENT: PERRLA, EOM intact, no ptosis, no other lesions NECK: Supple, short and thick Pulmonary:.No wheezing, No rales  distant Cardiovascular:  Normal S1,S2.  No m/r/g.      Abdomen:Benign, Soft, non-tender, No masses, hepatosplenomegaly, No lymphadenopathy Endoc: No evident thyromegaly, no signs of acromegaly or Cushing features Skin:   warm, no rashes, no ecchymosis  Extremities: normal, no cyanosis, clubbing, no edema, warm with normal  capillary refill.   Labs results:   Recent Labs    09/18/17 1249 09/19/17 0433 09/20/17 1230  HGB 13.5 12.7*  --   HCT 40.8 38.8*  --   MCV 91.4 91.8  --   WBC 8.2 8.5  --   BUN 9 12 20   CREATININE 0.85 1.02 1.02  GLUCOSE 125* 125* 151*  CALCIUM 8.7* 8.6* 8.7*  INR  --  1.07  --   ,  CLINICAL DATA:  Status post right thoracentesis today.  EXAM: CHEST  1 VIEW  COMPARISON:  PA and lateral chest 11/18/2017.  FINDINGS: Right pleural effusion is decreased after thoracentesis. No pneumothorax. Bibasilar atelectasis is seen. There is cardiomegaly.  IMPRESSION: Decreased right pleural effusion after thoracentesis. Negative for pneumothorax. No  other change.   Electronically Signed   By: Inge Rise M.D.   On: 09/19/2017 10:16   IMPRESSION: 1. Pattern of pulmonary fibrosis is unchanged. Together with bilateral calcified pleural plaques, findings are most suggestive of asbestosis. 2. Aortic atherosclerosis (ICD10-170.0). Coronary artery calcification. 3.  Emphysema (ICD10-J43.9).   Electronically Signed   By: Lorin Picket M.D.   On: 05/31/2017 07:22  Glucose, Fluid mg/dL 106    Total protein, fluid g/dL 3.9    LD, Fluid 3 - 23 U/L 462High     Amylase, Fluid U/L <5   Culture NO GROWTH < 24 HOURS ( pleural effusion) Performed at [facility] [address] [place],  [address]     Report Status PENDING       Assessment and Plan: Pulmonary wise  Shortness of breath, + copd, pulmonary fibrosis, diastolic heart  dysfunction. + right pleural effusion. S/p thoracentesis. Fluid appears exudative. Cytology and culture pending. Right heart cath showed mild pulmonary hypertension. + obstructive sleep apnea. The dyspnea is much better post tap. Ambulating  -duo neb q 6 hrs -awaiting cytology and c/s  results -out patient pfts -following cardiology recs (diresis etc) -rest and exercise 02 sats -continue his cpap at same  settings -following    I have personally obtained a history, examined the patient, evaluated laboratory and imaging results, formulated the assessment and plan and placed orders.  The Patient requires high complexity decision making for assessment and support, frequent evaluation and titration of therapies, application of advanced monitoring technologies and extensive interpretation of multiple databases.   Herbon Fleming,M.D. Board certified in [facility]

## 2017-09-21 ENCOUNTER — Telehealth: Payer: Self-pay | Admitting: Cardiovascular Disease

## 2017-09-21 DIAGNOSIS — I5033 Acute on chronic diastolic (congestive) heart failure: Secondary | ICD-10-CM

## 2017-09-21 DIAGNOSIS — E876 Hypokalemia: Secondary | ICD-10-CM

## 2017-09-21 DIAGNOSIS — I5032 Chronic diastolic (congestive) heart failure: Secondary | ICD-10-CM

## 2017-09-21 LAB — PH, BODY FLUID: pH, Body Fluid: 7.4

## 2017-09-21 LAB — BASIC METABOLIC PANEL
Anion gap: 11 (ref 5–15)
BUN: 24 mg/dL — ABNORMAL HIGH (ref 6–20)
CO2: 26 mmol/L (ref 22–32)
Calcium: 8.6 mg/dL — ABNORMAL LOW (ref 8.9–10.3)
Chloride: 99 mmol/L — ABNORMAL LOW (ref 101–111)
Creatinine, Ser: 0.9 mg/dL (ref 0.61–1.24)
GFR calc Af Amer: >60 mL/min (ref >60)
GFR calc non Af Amer: >60 mL/min (ref >60)
Glucose, Bld: 146 mg/dL — ABNORMAL HIGH (ref 65–99)
Potassium: 3.3 mmol/L — ABNORMAL LOW (ref 3.5–5.1)
Sodium: 136 mmol/L (ref 135–145)

## 2017-09-21 LAB — CBC
HCT: 39.8 % — ABNORMAL LOW (ref 40.0–52.0)
Hemoglobin: 13.1 g/dL (ref 13.0–18.0)
MCH: 29.9 pg (ref 26.0–34.0)
MCHC: 32.9 g/dL (ref 32.0–36.0)
MCV: 90.9 fL (ref 80.0–100.0)
Platelets: 167 10*3/uL (ref 150–440)
RBC: 4.38 MIL/uL — ABNORMAL LOW (ref 4.40–5.90)
RDW: 14.9 % — ABNORMAL HIGH (ref 11.5–14.5)
WBC: 9.6 10*3/uL (ref 3.8–10.6)

## 2017-09-21 MED ORDER — ISOSORBIDE MONONITRATE ER 60 MG PO TB24: 60.0000 mg | ORAL_TABLET | Freq: Every day | ORAL | Status: DC

## 2017-09-21 MED ORDER — LOSARTAN POTASSIUM 25 MG PO TABS: 25.0000 mg | ORAL_TABLET | Freq: Every day | ORAL | 1 refills | Status: DC

## 2017-09-21 MED ORDER — ISOSORBIDE MONONITRATE ER 30 MG PO TB24: 30.0000 mg | ORAL_TABLET | Freq: Every day | ORAL | 1 refills | Status: DC

## 2017-09-21 MED ORDER — CARVEDILOL 3.125 MG PO TABS: 6.2500 mg | ORAL_TABLET | Freq: Two times a day (BID) | ORAL | 1 refills | Status: DC

## 2017-09-21 MED ORDER — FUROSEMIDE 20 MG PO TABS: 40.0000 mg | ORAL_TABLET | Freq: Two times a day (BID) | ORAL | 1 refills | Status: DC

## 2017-09-21 NOTE — Telephone Encounter (Signed)
TCM....  Patient is being discharged   They saw R. Dunn   They are scheduled to see Angelica Ran on 6/24  They were seen for acute on chronic diastolic CHF  They need to be seen within 1 week   Pt added to wait list   Please call

## 2017-09-21 NOTE — Telephone Encounter (Signed)
Message from [place] as well to address when we call patient for TCM.  Dunn, Ryan M, PA-C  P Cv Div [place] Triage        Can we please get him schedule for a bmet on 6/10? Dx: hypokalemia and chronic diastolic CHF.

## 2017-09-21 NOTE — Telephone Encounter (Signed)
S/w patient's wife, ok per DPR. [name] contacted regarding discharge from [facility] on 09/21/17.   [name] understands to follow up with provider ? On 10/09/17 at 2 pm at [facility].  [name] understands discharge instructions? Yes  [name] understands medications and regiment? Yes  [name] understands to bring all medications to this visit? Yes

## 2017-09-21 NOTE — Plan of Care (Signed)
Up in room on room air without complaints.

## 2017-09-21 NOTE — Progress Notes (Signed)
Progress Note  Patient Name: Andre [name] Date of Encounter: 09/21/2017  Primary Cardiologist: Fletcher Anon  Subjective   SOB much improved. Slept mostly supine on room air. No chest pain. Up sitting in the chair. Transitioned to PO Lasix 40 mg bid on 6/5 with a documented UOP of 2 L for the admission. Weight 245 from 6/5, no weight this morning. Potassium 3.3 this morning.   Inpatient Medications    Scheduled Meds: . apixaban  5 mg Oral BID  . carbidopa-levodopa  2 tablet Oral TID  . carvedilol  6.25 mg Oral BID WC  . escitalopram  10 mg Oral Daily  . fluticasone  1 spray Each Nare Daily  . furosemide  40 mg Oral BID  . isosorbide mononitrate  60 mg Oral Daily  . levothyroxine  50 mcg Oral Q1200  . losartan  100 mg Oral Daily  . mouth rinse  15 mL Mouth Rinse BID  . mirabegron ER  50 mg Oral Daily  . multivitamin with minerals  1 tablet Oral Daily  . pantoprazole  40 mg Oral Daily  . potassium chloride SA  20 mEq Oral Daily  . protein supplement shake  11 oz Oral BID BM  . rosuvastatin  10 mg Oral QHS  . sodium chloride flush  3 mL Intravenous Q12H  . sodium chloride flush  3 mL Intravenous Q12H  . sodium chloride flush  3 mL Intravenous Q12H   Continuous Infusions: . sodium chloride     PRN Meds: sodium chloride, acetaminophen **OR** acetaminophen, albuterol, cyclobenzaprine, HYDROcodone-acetaminophen, ondansetron **OR** ondansetron (ZOFRAN) IV, polyvinyl alcohol, senna-docusate, sodium chloride flush   Vital Signs    Vitals:   09/20/17 1300 09/20/17 1641 09/20/17 1932 09/21/17 0459  BP:  113/66 (!) 114/51 139/62  Pulse:  69 69 70  Resp:  16 18 20   Temp:   98 F (36.7 C) 97.7 F (36.5 C)  TempSrc:   Oral Oral  SpO2:  90% 93% 90%  Weight: 245 lb 11.2 oz (111.4 kg)     Height:        Intake/Output Summary (Last 24 hours) at 09/21/2017 1004 Last data filed at 09/20/2017 2100 Gross per 24 hour  Intake 480 ml  Output 500 ml  Net -20 ml   Filed Weights   09/18/17  1236 09/18/17 1601 09/20/17 1300  Weight: 250 lb (113.4 kg) 249 lb 6.4 oz (113.1 kg) 245 lb 11.2 oz (111.4 kg)    Telemetry    NSR - Personally Reviewed  ECG    n/a - Personally Reviewed  Physical Exam   GEN: No acute distress.   Neck: No JVD. Cardiac: RRR, no murmurs, rubs, or gallops.  Respiratory: Faint bibasilar crackles.  GI: Soft, nontender, non-distended.   MS: No edema; No deformity. Neuro:  Alert and oriented x 3; Nonfocal.  Psych: Normal affect.  Labs    Chemistry Recent Labs  Lab 09/19/17 0433 09/20/17 1230 09/21/17 0810  NA 140 135 136  K 3.8 4.0 3.3*  CL 101 97* 99*  CO2 28 26 26   GLUCOSE 125* 151* 146*  BUN 12 20 24*  CREATININE 1.02 1.02 0.90  CALCIUM 8.6* 8.7* 8.6*  GFRNONAA >60 >60 >60  GFRAA >60 >60 >60  ANIONGAP 11 12 11      Hematology Recent Labs  Lab 09/18/17 1249 09/19/17 0433 09/21/17 0810  WBC 8.2 8.5 9.6  RBC 4.47 4.23* 4.38*  HGB 13.5 12.7* 13.1  HCT 40.8 38.8* 39.8*  MCV 91.4 91.8 90.9  MCH 30.2 30.0 29.9  MCHC 33.0 32.6 32.9  RDW 15.1* 15.0* 14.9*  PLT 183 174 167    Cardiac EnzymesNo results for input(s): TROPONINI in the last 168 hours. No results for input(s): TROPIPOC in the last 168 hours.   BNPNo results for input(s): BNP, PROBNP in the last 168 hours.   DDimer No results for input(s): DDIMER in the last 168 hours.   Radiology    Dg Chest 1 View  Result Date: 09/19/2017 IMPRESSION: Decreased right pleural effusion after thoracentesis. Negative for pneumothorax. No other change. Electronically Signed   By: Inge Rise M.D.   On: 09/19/2017 10:16    Cardiac Studies   R/LHC 09/18/2017: Conclusion     LM lesion is 40% stenosed.  Ost LAD lesion is 100% stenosed.  Ost Ramus to Ramus lesion is 80% stenosed.  Ramus lesion is 80% stenosed.  Previously placed Prox RCA to Mid RCA drug eluting stent is widely patent.  Balloon angioplasty was performed.  Mid RCA lesion is 40% stenosed.  LIMA and is  normal in caliber.  The graft exhibits no disease.  Dist LM lesion is 30% stenosed.  1. Significant underlying three-vessel coronary artery disease with patent LIMA to LAD and patent RCA stent. Significant ostial disease in the ramus branch with diffuse disease proximally as well as ostial disease and first diagonal. These are unchanged from most recent cardiac catheterization. 2. Right heart catheterization showed mildly elevated filling pressures, minimal pulmonary hypertension and normal cardiac output.  Recommendations: Current findings do not explain the patient's severe exertional dyspnea. He does have disease in the ramus branch and first diagonal that is unchanged from before. Any PCI on this will require jailing the left circumflex and thus risks outweigh the benefits.  Continue same cardiac medications. Resume Eliquis tomorrow if no bleeding complications. Recommend further pulmonary evaluation of his dyspnea.    08/28/2017: Study Conclusions  - Procedure narrative: Transthoracic echocardiography. The study   was technically difficult. - Left ventricle: The cavity size was normal. There was mild   concentric hypertrophy. Systolic function was normal. The   estimated ejection fraction was in the range of 60% to 65%. Wall   motion was normal; there were no regional wall motion   abnormalities. Features are consistent with a pseudonormal left   ventricular filling pattern, with concomitant abnormal relaxation   and increased filling pressure (grade 2 diastolic dysfunction). - Aortic valve: There was mild stenosis. Mean gradient (S): 5 mm   Hg. Valve area (VTI): 1.24 cm^2. - Left atrium: The atrium was severely dilated. - Right atrium: The atrium was severely dilated. - Pulmonary arteries: Systolic pressure could not be accurately   estimated. - Pericardium, extracardiac: A trivial pericardial effusion was   identified.   Patient Profile     82 y.o. male with  history of CAD s/p 1-vessel CABG in 8841, chronic diastolic CHF, PAF on Eliquis, prior PE, interstitial lung disease, Parkinson's disease, COPD secondary to prior tobacco abuse, HTN, HLD, morbid obesity, and sleep apnea who is being seen today for the evaluation of SOB.  Assessment & Plan    1. Acute on chronic respiratory failure with hypoxia: -Much improved -Likely multifactorial including underlying ILD, recent PNA, OSA, possible OHS, right pleural effusion, and acute on chronic diastolic CHF -Improved following right-sided thoracentesis  -Now on room air -His dyspnea is out of proportion to his underlying cardiac disease  2. Right pleural effusion: -Status post thoracentesis  -  Labs pending -Per IM/PCCM  3. Acute on chronic diastolic CHF: -Continue PO Lasix 40 mg bid, will likely need to run on the drier side -Monitor UOP and respiratory status  -Continue Coreg and losartan -Set up BMP on 6/10 to assess renal function and potassium on higher dose Lasix  4. COPD/ILD: -Needs pulmonology evaluation  -Discussed with IM  5. CAD: -Recent LHC as above -Continue medical management  -Eliquis in place of ASA -Crestor -Coreg -Losartan  6. PAF: -Maintaining sinus rhythm -Eliquis -Coreg  7. Morbid obesity/OSA/possible OHS: -CPAP -Likely contributing to his dyspnea   8. Hypokalemia: -Replete to goal > 4.0    For questions or updates, please contact [place] Please consult [email or web address] for contact info under Cardiology/STEMI.    Signed, Christell Faith, PA-C [place] Pager: [phone] 09/21/2017, 10:04 AM

## 2017-09-21 NOTE — Telephone Encounter (Signed)
Patient wife calling to check on status When returning call, please call wife's cell phone at [phone]

## 2017-09-21 NOTE — Telephone Encounter (Signed)
Patient currently admitted at this time. 

## 2017-09-21 NOTE — Progress Notes (Deleted)
SATURATION QUALIFICATIONS: (This note is used to comply with regulatory documentation for home oxygen)  Patient Saturations on Room Air at Rest = 96%  Patient Saturations on Room Air while Ambulating = 92%  Patient Saturations on  Liters of oxygen while Ambulating = %  Please briefly explain why patient needs home oxygen: 

## 2017-09-21 NOTE — Progress Notes (Signed)
SATURATION QUALIFICATIONS: (This note is used to comply with regulatory documentation for home oxygen)  Patient Saturations on Room Air at Rest = 92%  Patient Saturations on Room Air while Ambulating =90%  Patient Saturations on Liters of oxygen while Ambulating = %  Please briefly explain why patient needs home oxygen: 

## 2017-09-21 NOTE — Care Management Important Message (Signed)
Copy of signed IM left with patient in room.  

## 2017-09-21 NOTE — Progress Notes (Signed)
IV and tele removed. Discharge instructions given. PRescriptions given to pt. Wife to take home. Pt has no further concerns at this time.

## 2017-09-22 ENCOUNTER — Telehealth: Payer: Self-pay

## 2017-09-22 LAB — CHOLESTEROL, BODY FLUID: Cholesterol, Fluid: 84 mg/dL

## 2017-09-22 NOTE — Telephone Encounter (Signed)
Transition Care Management Follow-up Telephone Call   Date discharged? 09/21/17   How have you been since you were released from the hospital? Overall ok.  No cough, , no congestion, no SOB, no fever. Sleeping 8-10 hours. Still a little tired throughout the day.    Do you understand why you were in the hospital? Shortness of breath   Do you understand the discharge instructions? Increase activity slowly.   Where were you discharged to? Home.   Items Reviewed:  Medications reviewed: Yes, taking all scheduled medications without issues.  Wife agrees and verbalizes understanding.   Allergies reviewed: Yes, prednisone and pravastatin.  Dietary changes reviewed: Low sodium, cardiac diet.  Referrals reviewed: Yes, cardiology.    Functional Questionnaire:   Activities of Daily Living (ADLs):   He states they are independent in the following: Independent in all ADLs.  States they require assistance with the following: He does not require assistance at this time.    Any transportation issues/concerns?: No.    Any patient concerns? None.    Confirmed importance and date/time of follow-up visits scheduled Yes, appointment scheduled 09/25/17 at 1130.   Provider Appointment booked with Dr. [name] (PCP).  Confirmed with patient if condition begins to worsen call PCP or go to the ER.  Patient was given the office number and encouraged to call back with question or concerns.  : Yes, information provided. No further questions or concerns.

## 2017-09-22 NOTE — Telephone Encounter (Signed)
Patient wife returning call  °

## 2017-09-22 NOTE — Telephone Encounter (Signed)
I left a message for the patient's wife that Andre [name] is a recommendation, but use carefully as too much may cause an elevation in his potassium I also advised I am mailing her information on "Cooking with less salt," but to call back with any further questions/ concerns.

## 2017-09-22 NOTE — Telephone Encounter (Signed)
Patient wife calling States patient was placed on a low sodium diet  Would like to know if we have any suggestions for a sodium subsitute Please call to discuss

## 2017-09-22 NOTE — Discharge Summary (Signed)
[facility] NAME: Andre [name]    MR#:  [id number]  DATE OF BIRTH:  03-31-1936  DATE OF ADMISSION:  09/18/2017 ADMITTING PHYSICIAN: Saundra Shelling, MD  DATE OF DISCHARGE: 09/21/2017  2:16 PM  PRIMARY CARE PHYSICIAN: [name], MD    ADMISSION DIAGNOSIS:  SOB (shortness of breath) [R06.02] Pleural effusion [J90]  DISCHARGE DIAGNOSIS:  Active Problems:   Pleural effusion   SECONDARY DIAGNOSIS:   Past Medical History:  Diagnosis Date  . Cervical spondylosis 10/01/2013  . Chronic diastolic CHF (congestive heart failure) ([facility])    a. 07/2016 Echo: >55%; b. 10/2016 Echo: EF 55-60%, Gr1 DD, Ao sclerosis w/o stenosis, sev dil LA; c. 08/2017 Echo: EF 60-65%, no rwma, Gr2 DD, mild AS, sev dil LA/RA.  [name] Coronary artery disease    a. 1998 s/p mini-cabg @ [facility] - LIMA->LAD;  b. 07/2016 St Echo:  Inadequate HR (max 97) w/ hypertensive response (220/96). Ex time only 2:54 - stopped due to dyspnea and leg pain;  c.  08/2016 MV: EF 67%, no ischemia, low risk; d. 10/2016 NSTEMI/Cath: LM 40, LAD 100ost, RI 80, LCX nl, RCA 95p (4.0x26 Onyx DES), 89m, LIMA->LAD nl, EF 50-55%.  . Depression   . GERD (gastroesophageal reflux disease)   . Hyperlipidemia   . Hypertension   . PAF (paroxysmal atrial fibrillation) ([facility])    a. s/p DCCV-->maintaining sinus on amiodarone;  b. CHA2DS2VASc = 5-->eliquis.  . Parkinson's disease ([place])    tremors  . Pulmonary embolism ([place]) 2011  . 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 Sleep apnea    wears CPAP    HOSPITAL COURSE:   82 year old male with past medical history of active sleep apnea, Parkinson's disease, paroxysmal atrial fibrillation, hypertension, hyperlipidemia, history of coronary artery disease, chronic diastolic CHF who presented to the hospital due to shortness of breath.  1.  Acute respiratory failure with hypoxia-multifactorial nature related to possible underlying interstitial lung disease, sleep  apnea, mild CHF.   -Patient is status post ultrasound-guided thoracentesis on the right side with 1 L of fluid removed.  The fluid was consistent with a transudate of effusion.  Patient was also diuresed with IV Lasix and has improved.  He is now being discharged on a higher dose of oral Lasix. - Patient was also seen by pulmonary who think that he has some underlying chronic interstitial lung disease possibly related to asbestosis.  Patient would prefer to follow-up with [facility] pulmonology and he is to be referred as an outpatient.  2.  CHF-acute on chronic diastolic dysfunction. -Patient received IV diuresis and has improved.  He was discharged on a higher dose of oral Lasix.  He is also status post ultrasound-guided thoracentesis with 1 L of transudative fluid that was removed.  3.  History of chronic atrial fibrillation- he remained rate controlled. He will Continue carvedilol, continue Eliquis.  4.  History of Parkinson's disease- he will continue Sinemet.  5.  Hypothyroidism- he will continue Synthroid.  6.  Essential hypertension- he will continue carvedilol, Imdur, losartan.  7.  History of urinary incontinence- he will continue Myrbetriq.  8.  Hyperlipidemia- he will continue Crestor.  9. GERD - he will cont. His Nexium.     DISCHARGE CONDITIONS:   Stable.   CONSULTS OBTAINED:  Treatment Team:  Erby Pian, MD Minna Merritts, MD  DRUG ALLERGIES:   Allergies  Allergen Reactions  . Pravastatin Other (See Comments)  . Prednisone Other (See  Comments)    Pt states that med makes him hyper Pt states that med makes him hyper    DISCHARGE MEDICATIONS:   Allergies as of 09/21/2017      Reactions   Pravastatin Other (See Comments)   Prednisone Other (See Comments)   Pt states that med makes him hyper Pt states that med makes him hyper      Medication List    TAKE these medications   carbidopa-levodopa 25-250 MG tablet Commonly known as:   SINEMET Take 2 tablets by mouth 3 (three) times daily.   carvedilol 3.125 MG tablet Commonly known as:  COREG Take 2 tablets (6.25 mg total) by mouth 2 (two) times daily with a meal. What changed:  medication strength   clopidogrel 75 MG tablet Commonly known as:  PLAVIX TAKE ONE TABLET BY MOUTH EVERY DAY WITH BREAKFAST   cyclobenzaprine 5 MG tablet Commonly known as:  FLEXERIL Take 5 mg by mouth daily as needed for muscle spasms.   ELIQUIS 5 MG Tabs tablet Generic drug:  apixaban TAKE ONE TABLET TWICE DAILY   EPINEPHrine 0.3 mg/0.3 mL Soaj injection Commonly known as:  EPI-PEN Inject 0.3 mg as directed once as needed (allergic reaction). Reported on 10/27/2015   escitalopram 10 MG tablet Commonly known as:  LEXAPRO Take 1 tablet (10 mg total) by mouth daily.   esomeprazole 20 MG capsule Commonly known as:  NEXIUM Take 40 mg by mouth daily at 12 noon.   fluticasone 50 MCG/ACT nasal spray Commonly known as:  FLONASE TAKE 2 PUFFS IN EACH NOSTRIL EVERY DAY   furosemide 20 MG tablet Commonly known as:  LASIX Take 2 tablets (40 mg total) by mouth 2 (two) times daily. What changed:  when to take this   isosorbide mononitrate 30 MG 24 hr tablet Commonly known as:  IMDUR Take 1 tablet (30 mg total) by mouth daily. What changed:    medication strength  how much to take   levothyroxine 50 MCG tablet Commonly known as:  SYNTHROID, LEVOTHROID Take 50 mcg by mouth daily before lunch.   losartan 25 MG tablet Commonly known as:  COZAAR Take 1 tablet (25 mg total) by mouth daily. What changed:    medication strength  how much to take   MYRBETRIQ 50 MG Tb24 tablet Generic drug:  mirabegron ER Take 50 mg by mouth daily.   potassium chloride SA 20 MEQ tablet Commonly known as:  K-DUR,KLOR-CON Take 1 tablet (20 mEq total) by mouth daily.   rosuvastatin 10 MG tablet Commonly known as:  CRESTOR Take 10 mg by mouth at bedtime.   SYSTANE OP Place 1 drop into both  eyes 2 (two) times daily as needed (dry eyes).   TYLENOL 500 MG tablet Generic drug:  acetaminophen Take 1,000 mg by mouth every 6 (six) hours as needed for mild pain or moderate pain.   VENTOLIN HFA 108 (90 Base) MCG/ACT inhaler Generic drug:  albuterol TAKE 2 PUFFS EVERY 8 HOURS AS NEEDED FORWHEEZING What changed:  Another medication with the same name was changed. Make sure you understand how and when to take each.   albuterol (2.5 MG/3ML) 0.083% nebulizer solution Commonly known as:  PROVENTIL Take 3 mLs (2.5 mg total) by nebulization every 4 (four) hours as needed for wheezing or shortness of breath. What changed:  when to take this         DISCHARGE INSTRUCTIONS:   DIET:  Cardiac diet  DISCHARGE CONDITION:  Stable  ACTIVITY:  Activity as tolerated  OXYGEN:  Home Oxygen: No.   Oxygen Delivery: room air  DISCHARGE LOCATION:  home   If you experience worsening of your admission symptoms, develop shortness of breath, life threatening emergency, suicidal or homicidal thoughts you must seek medical attention immediately by calling 911 or calling your MD immediately  if symptoms less severe.  You Must read complete instructions/literature along with all the possible adverse reactions/side effects for all the Medicines you take and that have been prescribed to you. Take any new Medicines after you have completely understood and accpet all the possible adverse reactions/side effects.   Please note  You were cared for by a hospitalist during your hospital stay. If you have any questions about your discharge medications or the care you received while you were in the hospital after you are discharged, you can call the unit and asked to speak with the hospitalist on call if the hospitalist that took care of you is not available. Once you are discharged, your primary care physician will handle any further medical issues. Please note that NO REFILLS for any discharge medications  will be authorized once you are discharged, as it is imperative that you return to your primary care physician (or establish a relationship with a primary care physician if you do not have one) for your aftercare needs so that they can reassess your need for medications and monitor your lab values.     Today   Shortness of breath improved, no cough, fever or congestion.  Will discharge home today.  VITAL SIGNS:  Blood pressure 130/70, pulse 66, temperature 97.7 F (36.5 C), temperature source Oral, resp. rate 14, height 5\' 7"  (1.702 m), weight 111.4 kg (245 lb 11.2 oz), SpO2 95 %.  I/O:  No intake or output data in the 24 hours ending 09/22/17 1518  PHYSICAL EXAMINATION:   GENERAL:  82 y.o.-year-old obese patient sitting up in chair in no acute distress.  EYES: Pupils equal, round, reactive to light and accommodation. No scleral icterus. Extraocular muscles intact.  HEENT: Head atraumatic, normocephalic. Oropharynx and nasopharynx clear.  NECK:  Supple, no jugular venous distention. No thyroid enlargement, no tenderness.  LUNGS: Normal breath sounds bilaterally, no wheezing, rales, rhonchi. No use of accessory muscles of respiration.  CARDIOVASCULAR: S1, S2 normal. No murmurs, rubs, or gallops.  ABDOMEN: Soft, nontender, nondistended. Bowel sounds present. No organomegaly or mass.  EXTREMITIES: No cyanosis, clubbing, +1 edema b/l NEUROLOGIC: Cranial nerves II through XII are intact. No focal Motor or sensory deficits b/l.   PSYCHIATRIC: The patient is alert and oriented x 3.  SKIN: No obvious rash, lesion, or ulcer.    DATA REVIEW:   CBC Recent Labs  Lab 09/21/17 0810  WBC 9.6  HGB 13.1  HCT 39.8*  PLT 167    Chemistries  Recent Labs  Lab 09/21/17 0810  NA 136  K 3.3*  CL 99*  CO2 26  GLUCOSE 146*  BUN 24*  CREATININE 0.90  CALCIUM 8.6*    Cardiac Enzymes No results for input(s): TROPONINI in the last 168 hours.  Microbiology Results  Results for orders  placed or performed during the hospital encounter of 09/18/17  Body fluid culture     Status: None (Preliminary result)   Collection Time: 09/19/17  9:25 AM  Result Value Ref Range Status   Specimen Description   Final    PLEURAL Performed at [facility], [address], [place], [place] [address]    Special  Requests   Final    NONE Performed at [facility], [address] [place], [place] [address]    Gram Stain   Final    RARE WBC PRESENT, PREDOMINANTLY MONONUCLEAR NO ORGANISMS SEEN    Culture   Final    NO GROWTH 3 DAYS Performed at [facility], [place] [address], [place], [place] [address]    Report Status PENDING  Incomplete    RADIOLOGY:  No results found.    Management plans discussed with the patient, family and they are in agreement.  CODE STATUS:  Code Status History    Date Active Date Inactive Code Status Order ID Comments User Context   09/18/2017 1623 09/21/2017 1722 Full Code [id number]  Saundra Shelling, MD Inpatient    TOTAL TIME TAKING CARE OF THIS PATIENT: 40 minutes.    Henreitta Leber M.D on 09/22/2017 at 3:18 PM  Between 7am to 6pm - Pager - [phone]  After 6pm go to [email or web address] - Technical brewer [facility]  Office  [phone]  CC: Primary care physician; [name], MD

## 2017-09-22 NOTE — Telephone Encounter (Signed)
I called and spoke with the patient's wife- she did not receive my message from earlier today, but is aware that Mrs. Andre [name] is safe to use in moderation and that the information coming to her will list various herbs/ spices that she can use to replace the salt in the patient's diet.   She is agreeable and voices understanding.

## 2017-09-23 LAB — BODY FLUID CULTURE: Culture: NO GROWTH

## 2017-09-24 DIAGNOSIS — J449 Chronic obstructive pulmonary disease, unspecified: Secondary | ICD-10-CM | POA: Diagnosis not present

## 2017-09-24 DIAGNOSIS — J441 Chronic obstructive pulmonary disease with (acute) exacerbation: Secondary | ICD-10-CM | POA: Diagnosis not present

## 2017-09-24 DIAGNOSIS — J45909 Unspecified asthma, uncomplicated: Secondary | ICD-10-CM | POA: Diagnosis not present

## 2017-09-24 DIAGNOSIS — G4733 Obstructive sleep apnea (adult) (pediatric): Secondary | ICD-10-CM | POA: Diagnosis not present

## 2017-09-25 ENCOUNTER — Other Ambulatory Visit
Admission: RE | Admit: 2017-09-25 | Discharge: 2017-09-25 | Disposition: A | Discharge status: Home / Self Care | Payer: PPO | Source: Ambulatory Visit | Attending: Physician Assistant | Admitting: Physician Assistant

## 2017-09-25 ENCOUNTER — Ambulatory Visit (INDEPENDENT_AMBULATORY_CARE_PROVIDER_SITE_OTHER): Payer: PPO | Admitting: Family Medicine

## 2017-09-25 ENCOUNTER — Telehealth: Payer: Self-pay | Admitting: Physician Assistant

## 2017-09-25 ENCOUNTER — Encounter: Payer: Self-pay | Admitting: Family Medicine

## 2017-09-25 ENCOUNTER — Ambulatory Visit (INDEPENDENT_AMBULATORY_CARE_PROVIDER_SITE_OTHER): Payer: PPO

## 2017-09-25 DIAGNOSIS — I5032 Chronic diastolic (congestive) heart failure: Secondary | ICD-10-CM | POA: Diagnosis not present

## 2017-09-25 DIAGNOSIS — I5033 Acute on chronic diastolic (congestive) heart failure: Secondary | ICD-10-CM

## 2017-09-25 DIAGNOSIS — F329 Major depressive disorder, single episode, unspecified: Secondary | ICD-10-CM | POA: Diagnosis not present

## 2017-09-25 DIAGNOSIS — E876 Hypokalemia: Secondary | ICD-10-CM | POA: Insufficient documentation

## 2017-09-25 DIAGNOSIS — J90 Pleural effusion, not elsewhere classified: Secondary | ICD-10-CM | POA: Diagnosis not present

## 2017-09-25 DIAGNOSIS — J189 Pneumonia, unspecified organism: Secondary | ICD-10-CM | POA: Diagnosis not present

## 2017-09-25 DIAGNOSIS — F419 Anxiety disorder, unspecified: Secondary | ICD-10-CM

## 2017-09-25 DIAGNOSIS — F32A Depression, unspecified: Secondary | ICD-10-CM

## 2017-09-25 LAB — BASIC METABOLIC PANEL
Anion gap: 11 (ref 5–15)
BUN: 16 mg/dL (ref 6–20)
CO2: 26 mmol/L (ref 22–32)
Calcium: 8.8 mg/dL — ABNORMAL LOW (ref 8.9–10.3)
Chloride: 100 mmol/L — ABNORMAL LOW (ref 101–111)
Creatinine, Ser: 0.99 mg/dL (ref 0.61–1.24)
GFR calc Af Amer: >60 mL/min (ref >60)
GFR calc non Af Amer: >60 mL/min (ref >60)
Glucose, Bld: 126 mg/dL — ABNORMAL HIGH (ref 65–99)
Potassium: 3.4 mmol/L — ABNORMAL LOW (ref 3.5–5.1)
Sodium: 137 mmol/L (ref 135–145)

## 2017-09-25 NOTE — Assessment & Plan Note (Signed)
I suspect this contributed to his pleural effusion as well.  Breathing has improved with increased dose of Lasix.  He will have lab work through cardiology today.  He will follow-up with cardiology as planned.

## 2017-09-25 NOTE — Assessment & Plan Note (Addendum)
Appears consistent with exudate based on LDH lights criteria.  Improved with thoracentesis.  Patient's breathing has improved as well.  We will repeat a chest x-ray given slight decreased breath sounds at his right base.  He will be scheduled with pulmonology for evaluation.  He will follow-up with cardiology as well.  He is given return precautions.

## 2017-09-25 NOTE — Progress Notes (Signed)
[name], MD Phone: [phone]  Andre [name] is a 82 y.o. male who presents today for f/u.  CC: hospital follow-up pleural effusion  The patient was hospitalized from 09/18/2017-09/21/2017 for shortness of breath and found to have a pleural effusion right greater than left.  He underwent thoracentesis with removal of 1 L of fluid.  He was diuresed with Lasix and his dose was increased to 40 mg twice daily.  He notes since discharge he has been breathing quite a bit better.  He is back towards his baseline.  No orthopnea or PND.  No swelling.  He does note quite a bit of anxiety.  He possibly had a panic attack last night while he was wearing his CPAP mask.  He felt like it was not working and he started to panic and feel like he could not breathe.  He used a paper bag to breathe into it and then was breathing normally afterwards.  He put the CPAP back on his face and went back to sleep.  He has had no chest pain.  He also noted a cold sweat this morning though otherwise no fevers or cough.  He is supposed to follow-up with cardiology as well as pulmonology.  Discharge summary reviewed.  Medications reviewed.  Patient is due to have lab work through cardiology today.  Thoracentesis fluid with mixed inflammation and reactive mesothelial cells.  Fluid culture was negative.  Appears consistent with exudate.  Social History   Tobacco Use  Smoking Status Former Smoker  . Packs/day: 1.00  . Years: 40.00  . Pack years: 40.00  . Types: Cigarettes, Pipe, Cigars  . Last attempt to quit: 04/18/1972  . Years since quitting: 45.4  Smokeless Tobacco Former Systems developer  . Types: Chew  . Quit date: 04/18/1972     ROS see history of present illness  Objective  Physical Exam Vitals:   09/25/17 1127 09/25/17 1203  BP: (!) 150/80 134/70  Pulse: 70   Temp: 98.1 F (36.7 C)   SpO2: 92%     BP Readings from Last 3 Encounters:  09/25/17 134/70  09/21/17 130/70  09/18/17 (!) 160/73   Wt Readings  from Last 3 Encounters:  09/25/17 244 lb 12.8 oz (111 kg)  09/20/17 245 lb 11.2 oz (111.4 kg)  09/18/17 250 lb (113.4 kg)    Physical Exam  Constitutional: No distress.  Cardiovascular: Normal rate, regular rhythm and normal heart sounds.  Pulmonary/Chest: Effort normal. No respiratory distress. He has no wheezes. He has no rales.  Slight decreased breath sounds right lower base  Musculoskeletal: He exhibits no edema.  Neurological: He is alert.  Skin: Skin is warm and dry. He is not diaphoretic.     Assessment/Plan: Please see individual problem list.  Pleural effusion Appears consistent with exudate based on LDH lights criteria.  Improved with thoracentesis.  Patient's breathing has improved as well.  We will repeat a chest x-ray given slight decreased breath sounds at his right base.  He will be scheduled with pulmonology for evaluation.  He will follow-up with cardiology as well.  He is given return precautions.  (HFpEF) heart failure with preserved ejection fraction ([place]) I suspect this contributed to his pleural effusion as well.  Breathing has improved with increased dose of Lasix.  He will have lab work through cardiology today.  He will follow-up with cardiology as planned.  Anxiety and depression He notes his anxiety is a little bit better with the Lexapro.  He notes his  dizziness resolved with discontinuing the Zoloft.  He may have had a panic attack last night.  He will continue on Lexapro.  He will monitor his symptoms.   No orders of the defined types were placed in this encounter.   No orders of the defined types were placed in this encounter.    [name], MD [place]

## 2017-09-25 NOTE — Patient Instructions (Signed)
Nice to see you. We will get a chest x-ray today. We will get you to see a new pulmonologist. Please follow-up with cardiology as planned. Please monitor your anxiety.  Should improve the longer you are on the Lexapro. If your breathing worsens for any reason please be evaluated.

## 2017-09-25 NOTE — Telephone Encounter (Signed)
Notes recorded by Emily Filbert, RN on 09/25/2017 at 5:36 PM EDT I called and spoke with the patient and his wife. They are aware of results. They are also aware to have the patient take an extra 20 meq of potassium tonight- he has not taken any potassium yet today as he takes it at night.  He will take potassium 20 meq 2 tablets (40 meq) tonight and tomorrow night, then resume potassium 20 meq 1 tablet daily.  The patient's wife verbalizes understanding. ------  Notes recorded by Rise Mu, PA-C on 09/25/2017 at 4:38 PM EDT Renal function stable on Lasix 40 mg twice daily. Potassium remains slightly low. Please continue Lasix 40 mg twice daily. Please take an extra 20 mEq of KCl on 6/10 and 6/11. Following this, he will resume his regularly dosed with KCl at 20 mEq daily.

## 2017-09-25 NOTE — Assessment & Plan Note (Signed)
He notes his anxiety is a little bit better with the Lexapro.  He notes his dizziness resolved with discontinuing the Zoloft.  He may have had a panic attack last night.  He will continue on Lexapro.  He will monitor his symptoms.

## 2017-09-26 ENCOUNTER — Other Ambulatory Visit: Payer: Self-pay

## 2017-09-26 NOTE — Patient Outreach (Signed)
[facility]) Care Management  [place]   09/26/2017  Andre [name] 1935/04/20 [id number]  82 year old male outreached by [place] services for 30 day post discharge medication review.  PMHx includes, but not limited to, paroxysmal atrial fibrillation, hypertension, GERD, Barrett's esophagus, Parkinson's disease, hyperlipidemia, dyspnea and hypothyroidism  Successful outreach attempt to Andre [name]' wife, Andre [name].  HIPAA identifiers verified.   Subjective: Andre [name] states that her husband is feeling better.  She reports that he still feels tired and week, but that his breathing is much improved.  He states that he has neck pain and that he takes flexeril as needed.  She states that he was told to take 40 meq of KCL x 2 days (6/10 and 6/11) because his potassium was low.  He is to resume his normal dose of 20 meq daily thereafter.  She reports that he weighs himself daily and that he is consistently 240-242 lbs since discharge.   Objective: labs per 09/25/17 SCr 0.99 mg/dL K of 4.2 mmol/L  Current Medications: Current Outpatient Medications  Medication Sig Dispense Refill  . albuterol (PROVENTIL) (2.5 MG/3ML) 0.083% nebulizer solution Take 3 mLs (2.5 mg total) by nebulization every 4 (four) hours as needed for wheezing or shortness of breath. (Patient taking differently: Take 2.5 mg by nebulization as needed for wheezing or shortness of breath. ) 75 mL 12  . carbidopa-levodopa (SINEMET) 25-250 MG tablet Take 2 tablets by mouth 3 (three) times daily. 540 tablet 3  . carvedilol (COREG) 3.125 MG tablet Take 2 tablets (6.25 mg total) by mouth 2 (two) times daily with a meal. 120 tablet 1  . clopidogrel (PLAVIX) 75 MG tablet TAKE ONE TABLET BY MOUTH EVERY DAY WITH BREAKFAST 30 tablet 3  . cyclobenzaprine (FLEXERIL) 5 MG tablet Take 5 mg by mouth daily as needed for muscle spasms.     [name] ELIQUIS 5 MG TABS tablet TAKE ONE TABLET TWICE DAILY 60 tablet 6  .  EPINEPHrine 0.3 mg/0.3 mL IJ SOAJ injection Inject 0.3 mg as directed once as needed (allergic reaction). Reported on 10/27/2015    . escitalopram (LEXAPRO) 10 MG tablet Take 1 tablet (10 mg total) by mouth daily. 90 tablet 1  . esomeprazole (NEXIUM) 20 MG capsule Take 40 mg by mouth daily at 12 noon.    . fluticasone (FLONASE) 50 MCG/ACT nasal spray TAKE 2 PUFFS IN EACH NOSTRIL EVERY DAY (Patient taking differently: TAKE 2 PUFFS IN EACH NOSTRIL EVERY DAY, takes as prn) 16 g 3  . furosemide (LASIX) 20 MG tablet Take 2 tablets (40 mg total) by mouth 2 (two) times daily. 60 tablet 1  . isosorbide mononitrate (IMDUR) 30 MG 24 hr tablet Take 1 tablet (30 mg total) by mouth daily. 30 tablet 1  . levothyroxine (SYNTHROID, LEVOTHROID) 50 MCG tablet Take 50 mcg by mouth daily before lunch.     . losartan (COZAAR) 25 MG tablet Take 1 tablet (25 mg total) by mouth daily. 30 tablet 1  . mirabegron ER (MYRBETRIQ) 50 MG TB24 tablet Take 50 mg by mouth daily.    Andre [name] Glycol-Propyl Glycol (SYSTANE OP) Place 1 drop into both eyes 2 (two) times daily as needed (dry eyes).    . potassium chloride SA (K-DUR,KLOR-CON) 20 MEQ tablet Take 1 tablet (20 mEq total) by mouth daily. 90 tablet 3  . rosuvastatin (CRESTOR) 10 MG tablet Take 10 mg by mouth at bedtime.    . VENTOLIN HFA 108 (90  Base) MCG/ACT inhaler TAKE 2 PUFFS EVERY 8 HOURS AS NEEDED FORWHEEZING 18 g 3   No current facility-administered medications for this visit.     Functional Status: In your present state of health, do you have any difficulty performing the following activities: 09/18/2017 08/26/2017  Hearing? N N  Comment - -  Vision? N N  Difficulty concentrating or making decisions? N N  Walking or climbing stairs? N N  Comment - -  Dressing or bathing? N N  Doing errands, shopping? N N  Some recent data might be hidden    Fall/Depression Screening: Fall Risk  05/25/2017 05/19/2017 05/15/2017  Falls in the past year? Yes Yes Yes  Number falls  in past yr: 1 1 1   Injury with Fall? - Yes Yes  Follow up - - -   PHQ 2/9 Scores 05/25/2017 05/19/2017 05/15/2017 11/21/2016 09/27/2016  PHQ - 2 Score 0 0 2 5 6   PHQ- 9 Score 3 - 6 11 19    ASSESSMENT: Date Discharged from Hospital: 09/21/17 Date Medication Reconciliation Performed: 09/26/2017  No new medications were prescribed at discharge.  Patient was recently discharged from hospital and all medications have been reviewed  Drugs sorted by system:  Neurologic/Psychologic: carbidopa/levodopa, escitalopram,   Cardiovascular: carvedilol, clopidogrel, apixaban, furosemide, isosorbide mononitrate, losartan, potassium chloride, rosuvastatin,   Pulmonary/Allergy: albuterol neb, epinephrine pen, fluticasone, albuterol HFA  Gastrointestinal: esomeprazole,   Endocrine: levothyroxine,   Topical: systane eye drops,   Pain: cyclobenzaprine  Miscellaneous: mirabegron  Medications to avoid in the elderly:  Per the [place] List, cyclobenzaprine, a skeletal muscle relaxant may be poorly tolerated by older adults and cause sedation and have increased risk of fracture.  There is strong evidence to avoid use in the elderly.   Drug interactions:  Use of esomeprazole may lead to reduced ability of clopidogrel to inhibit platelet aggregation and increase the risk of subsequent cardiovascular events.   Plan: Route discharge medication review note to PCP, Dr. [name].   Joetta Manners, PharmD Clinical Pharmacist [place] [phone]

## 2017-10-02 ENCOUNTER — Ambulatory Visit: Payer: PPO | Admitting: Urology

## 2017-10-02 ENCOUNTER — Encounter: Payer: Self-pay | Admitting: Urology

## 2017-10-02 ENCOUNTER — Encounter: Payer: Self-pay | Admitting: Internal Medicine

## 2017-10-02 ENCOUNTER — Telehealth: Payer: Self-pay | Admitting: Internal Medicine

## 2017-10-02 ENCOUNTER — Inpatient Hospital Stay: Payer: PPO | Attending: Internal Medicine

## 2017-10-02 VITALS — BP 146/83 | HR 66 | Ht 67.0 in | Wt 244.8 lb

## 2017-10-02 DIAGNOSIS — G4730 Sleep apnea, unspecified: Secondary | ICD-10-CM | POA: Diagnosis not present

## 2017-10-02 DIAGNOSIS — D696 Thrombocytopenia, unspecified: Secondary | ICD-10-CM | POA: Diagnosis not present

## 2017-10-02 DIAGNOSIS — I5032 Chronic diastolic (congestive) heart failure: Secondary | ICD-10-CM | POA: Diagnosis not present

## 2017-10-02 DIAGNOSIS — Z955 Presence of coronary angioplasty implant and graft: Secondary | ICD-10-CM | POA: Insufficient documentation

## 2017-10-02 DIAGNOSIS — I110 Hypertensive heart disease with heart failure: Secondary | ICD-10-CM | POA: Insufficient documentation

## 2017-10-02 DIAGNOSIS — E785 Hyperlipidemia, unspecified: Secondary | ICD-10-CM | POA: Diagnosis not present

## 2017-10-02 DIAGNOSIS — D751 Secondary polycythemia: Secondary | ICD-10-CM | POA: Insufficient documentation

## 2017-10-02 DIAGNOSIS — N3941 Urge incontinence: Secondary | ICD-10-CM

## 2017-10-02 DIAGNOSIS — G20 Parkinson's disease: Secondary | ICD-10-CM | POA: Diagnosis not present

## 2017-10-02 DIAGNOSIS — N281 Cyst of kidney, acquired: Secondary | ICD-10-CM | POA: Insufficient documentation

## 2017-10-02 DIAGNOSIS — Z79899 Other long term (current) drug therapy: Secondary | ICD-10-CM | POA: Diagnosis not present

## 2017-10-02 DIAGNOSIS — R339 Retention of urine, unspecified: Secondary | ICD-10-CM

## 2017-10-02 DIAGNOSIS — K219 Gastro-esophageal reflux disease without esophagitis: Secondary | ICD-10-CM | POA: Diagnosis not present

## 2017-10-02 DIAGNOSIS — Z86711 Personal history of pulmonary embolism: Secondary | ICD-10-CM | POA: Insufficient documentation

## 2017-10-02 DIAGNOSIS — I480 Paroxysmal atrial fibrillation: Secondary | ICD-10-CM | POA: Diagnosis not present

## 2017-10-02 DIAGNOSIS — Z951 Presence of aortocoronary bypass graft: Secondary | ICD-10-CM | POA: Insufficient documentation

## 2017-10-02 DIAGNOSIS — I2510 Atherosclerotic heart disease of native coronary artery without angina pectoris: Secondary | ICD-10-CM | POA: Diagnosis not present

## 2017-10-02 DIAGNOSIS — F329 Major depressive disorder, single episode, unspecified: Secondary | ICD-10-CM | POA: Insufficient documentation

## 2017-10-02 DIAGNOSIS — Z7901 Long term (current) use of anticoagulants: Secondary | ICD-10-CM | POA: Insufficient documentation

## 2017-10-02 LAB — CBC WITH DIFFERENTIAL/PLATELET
Basophils Absolute: 0.1 10*3/uL (ref 0–0.1)
Basophils Relative: 1 %
Eosinophils Absolute: 0.3 10*3/uL (ref 0–0.7)
Eosinophils Relative: 3 %
HCT: 40.6 % (ref 40.0–52.0)
Hemoglobin: 13.5 g/dL (ref 13.0–18.0)
Lymphocytes Relative: 18 %
Lymphs Abs: 2 10*3/uL (ref 1.0–3.6)
MCH: 30.1 pg (ref 26.0–34.0)
MCHC: 33.2 g/dL (ref 32.0–36.0)
MCV: 90.7 fL (ref 80.0–100.0)
Monocytes Absolute: 1 10*3/uL (ref 0.2–1.0)
Monocytes Relative: 9 %
Neutro Abs: 7.6 10*3/uL — ABNORMAL HIGH (ref 1.4–6.5)
Neutrophils Relative %: 69 %
Platelets: 192 10*3/uL (ref 150–440)
RBC: 4.48 MIL/uL (ref 4.40–5.90)
RDW: 14.8 % — ABNORMAL HIGH (ref 11.5–14.5)
WBC: 11 10*3/uL — ABNORMAL HIGH (ref 3.8–10.6)

## 2017-10-02 LAB — BLADDER SCAN AMB NON-IMAGING

## 2017-10-02 NOTE — Telephone Encounter (Signed)
L MOM for pt to schedule appt with Dr. Juanell Fairly for Fluid in R lung.

## 2017-10-02 NOTE — Addendum Note (Signed)
Addended by: Abbie Sons on: 10/02/2017 02:45 PM   Modules accepted: Level of Service

## 2017-10-02 NOTE — Progress Notes (Signed)
10/02/2017 2:38 PM   Andre [name] 07-12-1935 [id number]  Referring provider: [name], MD [address] [place], [place] [address]  Chief Complaint  Patient presents with  . Establish Care    HPI: 82 year old male previously seen at [facility] for urinary frequency and nocturia.  He saw Dr. Jacqlyn Larsen in January 2019 and has been stable on Myrbetriq.  He does have daytime frequency however is on Lasix 80 mg daily.  His voiding symptoms are presently not bothersome.  He had a recent chest CT which showed a large left renal cyst.  Follow-up renal ultrasound was performed which showed a 15 x 10 x 14 cm left lower pole renal cyst.  Review of his imaging studies remarkable for a large, benign renal cyst dating back to 2007 on a CT of the abdomen and pelvis.  He denies flank or abdominal pain.   PMH: Past Medical History:  Diagnosis Date  . Cervical spondylosis 10/01/2013  . Chronic diastolic CHF (congestive heart failure) ([place])    a. 07/2016 Echo: >55%; b. 10/2016 Echo: EF 55-60%, Gr1 DD, Ao sclerosis w/o stenosis, sev dil LA; c. 08/2017 Echo: EF 60-65%, no rwma, Gr2 DD, mild AS, sev dil LA/RA.  [name] Coronary artery disease    a. 1998 s/p mini-cabg @ [facility] - LIMA->LAD;  b. 07/2016 St Echo:  Inadequate HR (max 97) w/ hypertensive response (220/96). Ex time only 2:54 - stopped due to dyspnea and leg pain;  c.  08/2016 MV: EF 67%, no ischemia, low risk; d. 10/2016 NSTEMI/Cath: LM 40, LAD 100ost, RI 80, LCX nl, RCA 95p (4.0x26 Onyx DES), 33m, LIMA->LAD nl, EF 50-55%.  . Depression   . GERD (gastroesophageal reflux disease)   . Hyperlipidemia   . Hypertension   . PAF (paroxysmal atrial fibrillation) ([facility])    a. s/p DCCV-->maintaining sinus on amiodarone;  b. CHA2DS2VASc = 5-->eliquis.  . Parkinson's disease ([place])    tremors  . Pulmonary embolism ([place]) 2011  . 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 Sleep apnea    wears CPAP    Surgical History: Past Surgical History:  Procedure  Laterality Date  . BACK SURGERY  1960  . CARDIAC CATHETERIZATION    . CHOLECYSTECTOMY  2010  . CORONARY ARTERY BYPASS GRAFT  01/07/1997  . CORONARY STENT INTERVENTION N/A 10/31/2016   Procedure: Coronary Stent Intervention;  Surgeon: Wellington Hampshire, MD;  Location: [facility];  Service: Cardiovascular;  Laterality: N/A;  . ELECTROPHYSIOLOGIC STUDY N/A 07/14/2015   Procedure: CARDIOVERSION;  Surgeon: Yolonda Kida, MD;  Location: [facility];  Service: Cardiovascular;  Laterality: N/A;  . ELECTROPHYSIOLOGIC STUDY N/A 10/12/2015   Procedure: CARDIOVERSION;  Surgeon: Minna Merritts, MD;  Location: [facility];  Service: Cardiovascular;  Laterality: N/A;  . LEFT HEART CATH AND CORONARY ANGIOGRAPHY N/A 10/31/2016   Procedure: Left Heart Cath and Coronary Angiography;  Surgeon: Wellington Hampshire, MD;  Location: [place] [facility];  Service: Cardiovascular;  Laterality: N/A;  . OTHER SURGICAL HISTORY  1998   Bypass  . RIGHT/LEFT HEART CATH AND CORONARY ANGIOGRAPHY N/A 09/18/2017   Procedure: RIGHT/LEFT HEART CATH AND CORONARY ANGIOGRAPHY;  Surgeon: Wellington Hampshire, MD;  Location: [place] [facility];  Service: Cardiovascular;  Laterality: N/A;    Home Medications:  Allergies as of 10/02/2017      Reactions   Pravastatin Other (See Comments)   Prednisone Other (See Comments)   Pt states that med makes him hyper Pt states that med  makes him hyper      Medication List        Accurate as of 10/02/17  2:38 PM. Always use your most recent med list.          carbidopa-levodopa 25-250 MG tablet Commonly known as:  SINEMET Take 2 tablets by mouth 3 (three) times daily.   carvedilol 3.125 MG tablet Commonly known as:  COREG Take 2 tablets (6.25 mg total) by mouth 2 (two) times daily with a meal.   clopidogrel 75 MG tablet Commonly known as:  PLAVIX TAKE ONE TABLET BY MOUTH EVERY DAY WITH BREAKFAST   cyclobenzaprine 5 MG tablet Commonly known as:  FLEXERIL Take 5 mg by mouth  daily as needed for muscle spasms.   ELIQUIS 5 MG Tabs tablet Generic drug:  apixaban TAKE ONE TABLET TWICE DAILY   EPINEPHrine 0.3 mg/0.3 mL Soaj injection Commonly known as:  EPI-PEN Inject 0.3 mg as directed once as needed (allergic reaction). Reported on 10/27/2015   escitalopram 10 MG tablet Commonly known as:  LEXAPRO Take 1 tablet (10 mg total) by mouth daily.   esomeprazole 20 MG capsule Commonly known as:  NEXIUM Take 40 mg by mouth daily at 12 noon.   fluticasone 50 MCG/ACT nasal spray Commonly known as:  FLONASE TAKE 2 PUFFS IN EACH NOSTRIL EVERY DAY   furosemide 20 MG tablet Commonly known as:  LASIX Take 2 tablets (40 mg total) by mouth 2 (two) times daily.   isosorbide mononitrate 30 MG 24 hr tablet Commonly known as:  IMDUR Take 1 tablet (30 mg total) by mouth daily.   levothyroxine 50 MCG tablet Commonly known as:  SYNTHROID, LEVOTHROID Take 50 mcg by mouth daily before lunch.   losartan 25 MG tablet Commonly known as:  COZAAR Take 1 tablet (25 mg total) by mouth daily.   MYRBETRIQ 50 MG Tb24 tablet Generic drug:  mirabegron ER Take 50 mg by mouth daily.   potassium chloride SA 20 MEQ tablet Commonly known as:  K-DUR,KLOR-CON Take 1 tablet (20 mEq total) by mouth daily.   rosuvastatin 10 MG tablet Commonly known as:  CRESTOR Take 10 mg by mouth at bedtime.   SYSTANE OP Place 1 drop into both eyes 2 (two) times daily as needed (dry eyes).   VENTOLIN HFA 108 (90 Base) MCG/ACT inhaler Generic drug:  albuterol TAKE 2 PUFFS EVERY 8 HOURS AS NEEDED FORWHEEZING   albuterol (2.5 MG/3ML) 0.083% nebulizer solution Commonly known as:  PROVENTIL Take 3 mLs (2.5 mg total) by nebulization every 4 (four) hours as needed for wheezing or shortness of breath.       Allergies:  Allergies  Allergen Reactions  . Pravastatin Other (See Comments)  . Prednisone Other (See Comments)    Pt states that med makes him hyper Pt states that med makes him hyper     Family History: Family History  Problem Relation Age of Onset  . Alcohol abuse Father     Social History:  reports that he quit smoking about 45 years ago. His smoking use included cigarettes, pipe, and cigars. He has a 40.00 pack-year smoking history. He quit smokeless tobacco use about 45 years ago. His smokeless tobacco use included chew. He reports that he drinks about 2.4 oz of alcohol per week. He reports that he does not use drugs.  ROS: UROLOGY Frequent Urination?: No Hard to postpone urination?: No Burning/pain with urination?: No Get up at night to urinate?: Yes Leakage of urine?: No Urine stream  starts and stops?: No Trouble starting stream?: No Do you have to strain to urinate?: No Blood in urine?: No Urinary tract infection?: No Sexually transmitted disease?: No Injury to kidneys or bladder?: No Painful intercourse?: No Weak stream?: No Erection problems?: No Penile pain?: No  Gastrointestinal Nausea?: No Vomiting?: No Indigestion/heartburn?: No Diarrhea?: No Constipation?: Yes  Constitutional Fever: No Night sweats?: Yes Weight loss?: No Fatigue?: Yes  Skin Skin rash/lesions?: No Itching?: No  Eyes Blurred vision?: No Double vision?: No  Ears/Nose/Throat Sore throat?: No Sinus problems?: No  Hematologic/Lymphatic Swollen glands?: No Easy bruising?: Yes  Cardiovascular Leg swelling?: Yes Chest pain?: No  Respiratory Cough?: No Shortness of breath?: Yes  Endocrine Excessive thirst?: No  Musculoskeletal Back pain?: No Joint pain?: No  Neurological Headaches?: No Dizziness?: Yes  Psychologic Depression?: No Anxiety?: Yes  Physical Exam: BP (!) 146/83 (BP Location: Right Arm, Patient Position: Sitting, Cuff Size: Large)   Pulse 66   Ht 5\' 7"  (1.702 m)   Wt 244 lb 12.8 oz (111 kg)   BMI 38.34 kg/m    Constitutional:  Alert and oriented, No acute distress. HEENT: [place] AT, moist mucus membranes.  Trachea midline, no  masses. Cardiovascular: No clubbing, cyanosis, or edema. Respiratory: Normal respiratory effort, no increased work of breathing. GI: Abdomen is soft, nontender, nondistended, no abdominal masses GU: No CVA tenderness Lymph: No cervical or inguinal lymphadenopathy. Skin: No rashes, bruises or suspicious lesions. Neurologic: Grossly intact, no focal deficits, moving all 4 extremities. Psychiatric: Normal mood and affect.  Laboratory Data:   Urinalysis Dipstick trace leukocytes/microscopy negative   Pertinent Imaging: Images were personally reviewed  Results for orders placed during the hospital encounter of 07/20/17  US Renal   Narrative CLINICAL DATA:  History of renal cyst.  EXAM: RENAL / URINARY TRACT ULTRASOUND COMPLETE  COMPARISON:  CT abdomen pelvis 11/14/2014  FINDINGS: Right Kidney:  Length: 12.9 cm. Echogenicity within normal limits. No mass or hydronephrosis visualized.  Left Kidney:  Length: 13.0 cm. Within the lower pole of the left kidney there is a 15.3 x 10.3 x 14.6 cm cyst. Additionally within the lower pole of the left kidney there is a 2.5 x 2.6 x 2.5 cm simple cyst. No hydronephrosis.  Bladder:  Not well distended and poorly visualized.  IMPRESSION: Persistent large cyst inferior pole left kidney.  No hydronephrosis.   Electronically Signed   By: Lovey Newcomer M.D.   On: 07/21/2017 08:27     Assessment & Plan:   82 year old male with a large, simple left renal cyst which is asymptomatic. No further evaluation or treatment is needed.  Stable lower urinary tract symptoms on Myrbetriq.  PVR by bladder scan today was 77 mL.  Follow-up annually or as needed.   Abbie Sons, [place] [address], [place], [place] [address] [phone]

## 2017-10-03 ENCOUNTER — Encounter: Payer: Self-pay | Admitting: Internal Medicine

## 2017-10-03 ENCOUNTER — Ambulatory Visit: Payer: PPO | Admitting: Internal Medicine

## 2017-10-03 VITALS — BP 144/84 | HR 70 | Resp 16 | Ht 67.0 in | Wt 245.0 lb

## 2017-10-03 DIAGNOSIS — R0602 Shortness of breath: Secondary | ICD-10-CM

## 2017-10-03 DIAGNOSIS — G4733 Obstructive sleep apnea (adult) (pediatric): Secondary | ICD-10-CM

## 2017-10-03 DIAGNOSIS — J849 Interstitial pulmonary disease, unspecified: Secondary | ICD-10-CM

## 2017-10-03 LAB — MICROSCOPIC EXAMINATION
Epithelial Cells (non renal): NONE SEEN /hpf (ref 0–10)
RBC, UA: NONE SEEN /hpf (ref 0–2)

## 2017-10-03 LAB — URINALYSIS, COMPLETE
Bilirubin, UA: NEGATIVE
Glucose, UA: NEGATIVE
Ketones, UA: NEGATIVE
Nitrite, UA: NEGATIVE
Protein, UA: NEGATIVE
RBC, UA: NEGATIVE
Specific Gravity, UA: 1.02 (ref 1.005–1.030)
Urobilinogen, Ur: 0.2 mg/dL (ref 0.2–1.0)
pH, UA: 7 (ref 5.0–7.5)

## 2017-10-03 NOTE — Progress Notes (Signed)
[facility] Consultation      MRN# [id number] TECUMSEH YEAGLEY Jan 06, 1936   CC: Chief Complaint  Patient presents with  . Hospitalization Follow-up    09/18/17 Pleural effusion  . Shortness of Breath    Pt on 2 L 02 with exertion and bled into cpap.  follow up SOB    Brief History: 82 yo M former smoker, history of CAD/CABG/hypertension/allergies on immunotherapy/OSA on CPAP/prior PE, initially seen in consultation for dyspnea, PFT with mixed restriction and obstruction, currently following with pulmonary for OSA optimization.  He notes that his breathing is short when he gets excited, it gets better with nebs or breathing into a bag.He is using his CPAP but notes that sometimes it is hard to breathe with it.  He has been in the hospital monthly for the past 3 months, for pleural effusion, HCAP, COPD exacerbation.  He has oxygen he is no longer using it at night with his CPAP, and would like to know whether he really needs to use it with his CPAP since he is doing better than when he had the test done. He is currently taking lasix 80 mg daily. He has last smoked about 25 years ago.  He is noted to be have mild ILD.   He has a rescue inhaler and nebulizer which he does not use.   **CXR 09/25/17; Imaging personally reviewed; moderate right pleural effusion.   He notes that his breathing has been going downhill since last summer after he had a mild heart attack.   PREVIOUS OV He was diagnosed with OSA in 2010, AHI= 52. Compliance report shows AHI 0.7, 100% compliance CPAP 12 cm h20  S/p cardiac stents placed   Quit tobacco abuse 40 years ago 6MWT was WNL at last visit   He is accompanied today by his wife.  No signs of infection at this time   Patient with recent admission for pneumonia Was given IV abx and steroids and feels much better since admission Chronic SOB AND DOE STABLE at this time Using and benefiting from CPAP therapy as  prescibed    Review of Systems  Constitutional: Negative for malaise/fatigue.  Eyes: Negative for blurred vision.  Respiratory: Positive for shortness of breath. Negative for cough, hemoptysis, sputum production and wheezing.   Cardiovascular: Negative for chest pain.  Genitourinary: Negative for dysuria.  Musculoskeletal: Negative for myalgias.  Neurological: Negative for dizziness, weakness and headaches.  Endo/Heme/Allergies: Does not bruise/bleed easily.  Psychiatric/Behavioral: Negative for depression.      Allergies:  Pravastatin and Prednisone  Physical Examination:  There were no vitals taken for this visit. BP (!) 144/84 (BP Location: Left Arm, Cuff Size: Large)   Pulse 70   Resp 16   Ht 5\' 7"  (1.702 m)   Wt 245 lb (111.1 kg)   SpO2 96%   BMI 38.37 kg/m   General Appearance: no resp distress HEENT: PERRLA, no ptosis, no other lesions noticed Pulmonary:normal breath sounds., diaphragmatic excursion normal.No wheezing, +rales/crackles Cardiovascular:  Normal S1,S2.  No m/r/g.     Abdomen:Exam: Benign, Soft, non-tender, No masses  Skin:   warm, no rashes, no ecchymosis  Extremities: +edema    Assessment and Plan:  81 year old male past medical history of obesity, obstructive sleep apnea, seen in follow-up visit for OSA optimization.  Now with chronic SOB likely from CHF and his morbid obesity and deconditioned state  Chronic diastolic congestive heart failure with chronic right-sided pleural effusion. -Recent hospital admission with right pleural  effusion which was drained. - Explained that this is a chronic condition, we may need another thoracentesis if needed if his breathing becomes very bad, however we will try to avoid this as it is likely that the fluid will return even after drained. - Repeat chest x-ray in approximately 1 week.  ILD on CT chest  PFT shows moderate restrictive lung disease -follow up cardiology -Asked to use nebulizer/metered-dose  inhaler of albuterol a few times a day to see if his breathing improves, if does not, may stop.   OSA (obstructive sleep apnea) Patient with prior sleep study with diagnosis of severe sleep apnea. AHI=1.2 on 12cm H2O of CPAP, with 83% compliance was in hospital for several days Plan: -Continue CPAP 12cm H2O with full face mask  Obesity -recommend significant weight loss -recommend changing diet  Deconditioned state, patient is at high risk of pulmonary complications.  -Recommend increased daily activity and exercise as tolerated  Orders Placed This Encounter  Procedures  . DG Chest 2 View  . Pulse oximetry, overnight   Return in about 3 months (around 01/03/2018) for with Dr. Mortimer Fries.   Marda Stalker, MD.   Board Certified in Internal Medicine, Pulmonary Medicine, [place], and Sleep Medicine.   Pulmonary and Critical Care Office Number: [phone] Pager: [phone]  Patricia Pesa, M.D.  Merton Border, M.D

## 2017-10-03 NOTE — Telephone Encounter (Signed)
Patient scheduled 6/18 at 3:30p with Dr Juanell Fairly

## 2017-10-03 NOTE — Patient Instructions (Addendum)
Will check a chest x ray in 1 week.  Try using your inhaler a few times per day to see if it helps. If it does not help you can stop it.   Will check ONO test on CPAP (without oxygen)  to see if you need oxygen.

## 2017-10-04 ENCOUNTER — Telehealth: Payer: Self-pay | Admitting: Cardiovascular Disease

## 2017-10-04 NOTE — Telephone Encounter (Signed)
I called and spoke with the patient's wife. I advised her that the patient takes eliquis for his a-fib and plavix for his stent. She is aware that the patient is ok to be on both drugs.

## 2017-10-04 NOTE — Telephone Encounter (Signed)
[name] calling Patient is on Eliquis 5MG  and clopidogrel (PLAVIX) 75MG  and the pharmacist is concerned he is on too many blood thinners Please call to discuss

## 2017-10-06 ENCOUNTER — Telehealth: Payer: Self-pay | Admitting: Internal Medicine

## 2017-10-06 DIAGNOSIS — M4802 Spinal stenosis, cervical region: Secondary | ICD-10-CM | POA: Diagnosis not present

## 2017-10-06 DIAGNOSIS — M5030 Other cervical disc degeneration, unspecified cervical region: Secondary | ICD-10-CM | POA: Diagnosis not present

## 2017-10-06 DIAGNOSIS — M5412 Radiculopathy, cervical region: Secondary | ICD-10-CM | POA: Diagnosis not present

## 2017-10-06 DIAGNOSIS — M62838 Other muscle spasm: Secondary | ICD-10-CM | POA: Diagnosis not present

## 2017-10-06 NOTE — Telephone Encounter (Signed)
Returned call a made spouse aware once insurance has approved they will get a call to schedule. Nothing further needed.

## 2017-10-06 NOTE — Telephone Encounter (Signed)
ONO on cpap is what was ordered. [facility] will contact patient.

## 2017-10-06 NOTE — Telephone Encounter (Signed)
Pt wife called, states he is supposed to have a sleep test and has not heard from the company, please call advise

## 2017-10-09 ENCOUNTER — Encounter

## 2017-10-09 ENCOUNTER — Ambulatory Visit: Payer: PPO | Admitting: Nurse Practitioner

## 2017-10-09 ENCOUNTER — Encounter: Payer: Self-pay | Admitting: Internal Medicine

## 2017-10-09 ENCOUNTER — Telehealth: Payer: Self-pay | Admitting: Urology

## 2017-10-09 ENCOUNTER — Encounter: Payer: Self-pay | Admitting: Nurse Practitioner

## 2017-10-09 VITALS — BP 120/70 | HR 66 | Ht 67.5 in | Wt 243.5 lb

## 2017-10-09 DIAGNOSIS — I480 Paroxysmal atrial fibrillation: Secondary | ICD-10-CM | POA: Diagnosis not present

## 2017-10-09 DIAGNOSIS — I10 Essential (primary) hypertension: Secondary | ICD-10-CM | POA: Diagnosis not present

## 2017-10-09 DIAGNOSIS — J90 Pleural effusion, not elsewhere classified: Secondary | ICD-10-CM

## 2017-10-09 DIAGNOSIS — I2510 Atherosclerotic heart disease of native coronary artery without angina pectoris: Secondary | ICD-10-CM | POA: Diagnosis not present

## 2017-10-09 DIAGNOSIS — J449 Chronic obstructive pulmonary disease, unspecified: Secondary | ICD-10-CM | POA: Diagnosis not present

## 2017-10-09 DIAGNOSIS — I5032 Chronic diastolic (congestive) heart failure: Secondary | ICD-10-CM

## 2017-10-09 DIAGNOSIS — Z79899 Other long term (current) drug therapy: Secondary | ICD-10-CM | POA: Diagnosis not present

## 2017-10-09 DIAGNOSIS — I5033 Acute on chronic diastolic (congestive) heart failure: Secondary | ICD-10-CM | POA: Diagnosis not present

## 2017-10-09 DIAGNOSIS — R0902 Hypoxemia: Secondary | ICD-10-CM | POA: Diagnosis not present

## 2017-10-09 DIAGNOSIS — G4733 Obstructive sleep apnea (adult) (pediatric): Secondary | ICD-10-CM | POA: Diagnosis not present

## 2017-10-09 NOTE — Progress Notes (Signed)
Office Visit    Patient Name: Andre [name] Date of Encounter: 10/09/2017  Primary Care Provider:  [name], MD Primary Cardiologist:  [name], MD  Chief Complaint    82 year old male with extensive past medical history including CAD status post one-vessel CABG in 2992, chronic diastolic congestive heart failure, paroxysmal atrial fibrillation on Eliquis, prior pulmonary embolism, interstitial lung disease, Parkinson's disease, COPD secondary to prior tobacco abuse, hypertension, hyperlipidemia, morbid obesity, sleep apnea, and recent right pleural effusion requiring thoracentesis, who presents for follow-up.  Past Medical History    Past Medical History:  Diagnosis Date  . Cervical spondylosis 10/01/2013  . Chronic diastolic CHF (congestive heart failure) ([place])    a. 07/2016 Echo: >55%; b. 10/2016 Echo: EF 55-60%, Gr1 DD, Ao sclerosis w/o stenosis, sev dil LA; c. 08/2017 Echo: EF 60-65%, no rwma, Gr2 DD, mild AS, sev dil LA/RA.  [name] Coronary artery disease    a. 1998 s/p mini-cabg @ [facility] - LIMA->LAD;  b. 07/2016 St Echo:  Inadequate HR (max 97) w/ hypertensive response (220/96). Ex time only 2:54 - stopped due to dyspnea and leg pain;  c.  08/2016 MV: EF 67%, no ischemia, low risk; d. 10/2016 NSTEMI/Cath: LM 40, LAD 100ost, RI 80, LCX nl, RCA 95p (4.0x26 Onyx DES), 77m, LIMA->LAD nl, EF 50-55%.  . Depression   . GERD (gastroesophageal reflux disease)   . Hyperlipidemia   . Hypertension   . PAF (paroxysmal atrial fibrillation) ([facility])    a. s/p DCCV-->maintaining sinus on amiodarone;  b. CHA2DS2VASc = 5-->eliquis.  . Parkinson's disease ([place])    tremors  . Pleural effusion   . Pulmonary embolism ([place]) 2011  . 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 Sleep apnea    wears CPAP   Past Surgical History:  Procedure Laterality Date  . BACK SURGERY  1960  . CARDIAC CATHETERIZATION    . CHOLECYSTECTOMY  2010  . CORONARY ARTERY BYPASS GRAFT  01/07/1997  . CORONARY STENT  INTERVENTION N/A 10/31/2016   Procedure: Coronary Stent Intervention;  Surgeon: Wellington Hampshire, MD;  Location: [facility];  Service: Cardiovascular;  Laterality: N/A;  . ELECTROPHYSIOLOGIC STUDY N/A 07/14/2015   Procedure: CARDIOVERSION;  Surgeon: Yolonda Kida, MD;  Location: [facility];  Service: Cardiovascular;  Laterality: N/A;  . ELECTROPHYSIOLOGIC STUDY N/A 10/12/2015   Procedure: CARDIOVERSION;  Surgeon: Minna Merritts, MD;  Location: [facility];  Service: Cardiovascular;  Laterality: N/A;  . LEFT HEART CATH AND CORONARY ANGIOGRAPHY N/A 10/31/2016   Procedure: Left Heart Cath and Coronary Angiography;  Surgeon: Wellington Hampshire, MD;  Location: [facility];  Service: Cardiovascular;  Laterality: N/A;  . OTHER SURGICAL HISTORY  1998   Bypass  . RIGHT/LEFT HEART CATH AND CORONARY ANGIOGRAPHY N/A 09/18/2017   Procedure: RIGHT/LEFT HEART CATH AND CORONARY ANGIOGRAPHY;  Surgeon: Wellington Hampshire, MD;  Location: [facility];  Service: Cardiovascular;  Laterality: N/A;    Allergies  Allergies  Allergen Reactions  . Pravastatin Other (See Comments)  . Prednisone Other (See Comments)    Pt states that med makes him hyper Pt states that med makes him hyper    History of Present Illness    82 year old male with the above complex past medical history including CAD status post one-vessel bypass in 1998, HFpEF, paroxysmal atrial for ablation on Eliquis, prior pulmonary embolism, interstitial lung disease, Parkinson's disease, COPD, prior tobacco abuse, hypertension, hyperlipidemia, morbid obesity, and sleep apnea.  In July 2018,  he suffered a non-STEMI underwent catheterization revealing severe RCA disease which required drug-eluting stent placement.  He also had significant ramus disease and a patent LIMA to the LAD.  The ramus was medically managed.  He has been dealing with chronic dyspnea ever since then.  Earlier this year, CT of the chest showed early pulmonary  fibrosis and COPD.  As result, amiodarone, which she was taking for A. fib, was discontinued.  In April 2019, he was admitted with respiratory failure and hypoxia and diagnosed with COPD and pneumonia.  He never fully recovered and was readmitted in May for heart failure and pneumonia.  I saw him in clinic on May 21 at which time he continued to report dyspnea on exertion.  PA and lateral chest x-ray showed increasing though still small right-sided pleural effusion and we increased his Lasix.  Unfortunately, symptoms worsened and he was set up for right and left heart cardiac catheterization, which Took Place June third and revealed stable coronary anatomy with mildly elevated filling pressures.  He was felt to be safe for discharge but then developed worsening dyspnea afterward and presented to the emergency department.  Chest x-ray showed increased right pleural effusion.  He was admitted and seen by pulmonology and subsequent underwent thoracentesis with improvement in symptoms.  He was subsequently discharged.  Since discharge, his weight has been stable at home at 241 pounds on his home scale.  He still experiences dyspnea with activity and also during periods of anxiety, which he says is related to his Parkinson's.  He has not had any chest pain.  His wife is noted improved energy overall and he seems less depressed.  He has not had any palpitations, PND, orthopnea, dizziness, syncope, edema, or early satiety.  Home Medications    Prior to Admission medications   Medication Sig Start Date End Date Taking? Authorizing Provider  albuterol (PROVENTIL) (2.5 MG/3ML) 0.083% nebulizer solution Take 3 mLs (2.5 mg total) by nebulization every 4 (four) hours as needed for wheezing or shortness of breath. Patient taking differently: Take 2.5 mg by nebulization as needed for wheezing or shortness of breath.  08/24/17  Yes Kasa, Maretta Bees, MD  carbidopa-levodopa (SINEMET) 25-250 MG tablet Take 2 tablets by mouth 3  (three) times daily. 02/01/17  Yes Kathrynn Ducking, MD  carvedilol (COREG) 3.125 MG tablet Take 2 tablets (6.25 mg total) by mouth 2 (two) times daily with a meal. 09/21/17 11/20/17 Yes Sainani, Belia Heman, MD  clopidogrel (PLAVIX) 75 MG tablet TAKE ONE TABLET BY MOUTH EVERY DAY WITH BREAKFAST 07/06/17  Yes Wellington Hampshire, MD  cyclobenzaprine (FLEXERIL) 5 MG tablet Take 5 mg by mouth daily as needed for muscle spasms.    Yes [provider]  ELIQUIS 5 MG TABS tablet TAKE ONE TABLET TWICE DAILY 02/06/17  Yes [name] A, MD  EPINEPHrine 0.3 mg/0.3 mL IJ SOAJ injection Inject 0.3 mg as directed once as needed (allergic reaction). Reported on 10/27/2015 07/01/13  Yes [provider]  escitalopram (LEXAPRO) 10 MG tablet Take 1 tablet (10 mg total) by mouth daily. 09/12/17  Yes [name], MD  esomeprazole (NEXIUM) 20 MG capsule Take 40 mg by mouth daily at 12 noon.   Yes [provider]  fluticasone (FLONASE) 50 MCG/ACT nasal spray TAKE 2 PUFFS IN EACH NOSTRIL EVERY DAY Patient taking differently: TAKE 2 PUFFS IN EACH NOSTRIL EVERY DAY, takes as prn 10/18/16  Yes Cook, Jayce G, DO  furosemide (LASIX) 20 MG tablet Take  2 tablets (40 mg total) by mouth 2 (two) times daily. 09/21/17  Yes Henreitta Leber, MD  isosorbide mononitrate (IMDUR) 30 MG 24 hr tablet Take 1 tablet (30 mg total) by mouth daily. 09/21/17 11/20/17 Yes Sainani, Belia Heman, MD  levothyroxine (SYNTHROID, LEVOTHROID) 50 MCG tablet Take 50 mcg by mouth daily before lunch.  09/07/16  Yes [provider]  losartan (COZAAR) 25 MG tablet Take 1 tablet (25 mg total) by mouth daily. 09/21/17 11/20/17 Yes Sainani, Belia Heman, MD  mirabegron ER (MYRBETRIQ) 50 MG TB24 tablet Take 50 mg by mouth daily.   Yes [provider]  Polyethyl Glycol-Propyl Glycol (SYSTANE OP) Place 1 drop into both eyes 2 (two) times daily as needed (dry eyes).   Yes [provider]  potassium chloride SA (K-DUR,KLOR-CON) 20 MEQ  tablet Take 1 tablet (20 mEq total) by mouth daily. 09/06/17  Yes Wellington Hampshire, MD  rosuvastatin (CRESTOR) 10 MG tablet Take 10 mg by mouth at bedtime.   Yes [provider]  VENTOLIN HFA 108 (90 Base) MCG/ACT inhaler TAKE 2 PUFFS EVERY 8 HOURS AS NEEDED FORWHEEZING 12/02/16  Yes Coral Spikes, DO    Review of Systems    He still has some dyspnea on exertion.  He denies chest pain, palpitations, pnd, orthopnea, n, v, dizziness, syncope, edema, weight gain, or early satiety.  All other systems reviewed and are otherwise negative except as noted above.  Physical Exam    VS:  BP 120/70 (BP Location: Left Arm, Patient Position: Sitting, Cuff Size: Normal)   Pulse 66   Ht 5' 7.5" (1.715 m)   Wt 243 lb 8 oz (110.5 kg)   BMI 37.57 kg/m  , BMI Body mass index is 37.57 kg/m. GEN: Well nourished, well developed, in no acute distress.  HEENT: normal.  Neck: Supple, no JVD, carotid bruits, or masses. Cardiac: RRR, 2/6 systolic ejection murmur at the upper sternal borders, no rubs, or gallops. No clubbing, cyanosis, edema.  Radials/DP/PT 2+ and equal bilaterally.  Respiratory:  Respirations regular and unlabored, diminished breath sounds at the right base, otherwise clear to auscultation. GI: Soft, nontender, nondistended, BS + x 4. MS: no deformity or atrophy. Skin: warm and dry, no rash. Neuro:  Strength and sensation are intact. Psych: Normal affect.  Accessory Clinical Findings    ECG -regular sinus rhythm, 66, left axis deviation, nonspecific ST and T changes.  Assessment & Plan    1.  HFpEF: Volume has been stable since his discharge from the hospital with his weight remaining at 241 on his home scale.  He has been much more diligent about weighing himself and watching his salt intake.  His wife says they have really changed his diet.  He is euvolemic on exam today.  Heart rate and blood pressure stable on current regimen.  I will follow-up a basic metabolic panel today given  weight loss overall.  2.  Right pleural effusion: He has been seen by pulmonology and is scheduled for a follow-up chest x-ray tomorrow.  He still has some dyspnea on exertion though this is improved compared to prior to hospitalization.  3.  Coronary artery disease: No recurrent chest pain.  Recent catheterization showed stable coronary anatomy with moderate obstructive ramus intermedius disease which is medically managed.  The right coronary artery stent that was placed last July remains patent.  He remains on beta-blocker, Plavix, nitrate, and ARB therapy.  No aspirin as he is on chronic Eliquis.  4.  Paroxysmal atrial fibrillation: Maintaining sinus.  Continue beta-blocker and Eliquis.  5.  COPD/interstitial lung disease: He has pulmonology follow-up.  6.  Morbid obesity: Patient and wife are actively trying to change his caloric intake with a goal of weight loss.  I congratulated them on this.  7.  Disposition: Follow-up basic metabolic panel today.  Patient will have a follow-up chest x-ray with pulmonology tomorrow as well as follow-up with pulmonology appointment.  Murray Hodgkins, NP 10/09/2017, 2:11 PM

## 2017-10-09 NOTE — Telephone Encounter (Signed)
[name], [name], called the office today.  She has been giving the patient one (1)  50mg  Myrbetriq TWICE a day (once in the morning and once in the evening).  She felt that she should increase since the patient is taking 80 mg of Lasix every today.    Patient has run out of his Rx for Myrbetriq and pharmacy ([facility]) will not fill since Rx was written for 50mg  once daily.  Please advise.  She can be reached at [phone].

## 2017-10-09 NOTE — Patient Instructions (Signed)
Medication Instructions:  Your physician recommends that you continue on your current medications as directed. Please refer to the Current Medication list given to you today.   Labwork: Your physician recommends that you return for lab work in: [place].   Testing/Procedures: none  Follow-Up: Your physician recommends that you schedule a follow-up appointment in: [place].   If you need a refill on your cardiac medications before your next appointment, please call your pharmacy.

## 2017-10-10 ENCOUNTER — Ambulatory Visit
Admission: RE | Admit: 2017-10-10 | Discharge: 2017-10-10 | Disposition: A | Discharge status: Home / Self Care | Payer: PPO | Source: Ambulatory Visit | Attending: Internal Medicine | Admitting: Internal Medicine

## 2017-10-10 DIAGNOSIS — J984 Other disorders of lung: Secondary | ICD-10-CM | POA: Diagnosis not present

## 2017-10-10 DIAGNOSIS — J849 Interstitial pulmonary disease, unspecified: Secondary | ICD-10-CM | POA: Insufficient documentation

## 2017-10-10 DIAGNOSIS — G4733 Obstructive sleep apnea (adult) (pediatric): Secondary | ICD-10-CM | POA: Insufficient documentation

## 2017-10-10 DIAGNOSIS — J90 Pleural effusion, not elsewhere classified: Secondary | ICD-10-CM | POA: Diagnosis not present

## 2017-10-10 DIAGNOSIS — R0602 Shortness of breath: Secondary | ICD-10-CM | POA: Insufficient documentation

## 2017-10-10 LAB — BASIC METABOLIC PANEL
BUN/Creatinine Ratio: 28 — ABNORMAL HIGH (ref 10–24)
BUN: 23 mg/dL (ref 8–27)
CO2: 24 mmol/L (ref 20–29)
Calcium: 8.8 mg/dL (ref 8.6–10.2)
Chloride: 98 mmol/L (ref 96–106)
Creatinine, Ser: 0.82 mg/dL (ref 0.76–1.27)
GFR calc Af Amer: 97 mL/min/{1.73_m2} (ref >59)
GFR calc non Af Amer: 84 mL/min/{1.73_m2} (ref >59)
Glucose: 109 mg/dL — ABNORMAL HIGH (ref 65–99)
Potassium: 3.7 mmol/L (ref 3.5–5.2)
Sodium: 139 mmol/L (ref 134–144)

## 2017-10-10 NOTE — Telephone Encounter (Signed)
Spoke to patient's wife and informed her it is not recommended to give Myrbetriq 50mg  BID. He is only to take it 1 time daily. I offered her a nurse appointment to bring him in for PVR to ensure efficient emptying. If the residual is acceptable we can give a couple weeks of Mybetriq until they can get it refilled from pharmacy.

## 2017-10-11 ENCOUNTER — Ambulatory Visit (INDEPENDENT_AMBULATORY_CARE_PROVIDER_SITE_OTHER): Payer: PPO

## 2017-10-11 DIAGNOSIS — R339 Retention of urine, unspecified: Secondary | ICD-10-CM

## 2017-10-11 NOTE — Progress Notes (Signed)
Bladder Scan Patient can void: 0 ml Performed By: Fonnie Jarvis, CMA  Patient PVR was good today patient told ok to continue on Myrbetriq 50mg  once daily.

## 2017-10-12 DIAGNOSIS — G4733 Obstructive sleep apnea (adult) (pediatric): Secondary | ICD-10-CM | POA: Diagnosis not present

## 2017-10-12 DIAGNOSIS — J45909 Unspecified asthma, uncomplicated: Secondary | ICD-10-CM | POA: Diagnosis not present

## 2017-10-12 DIAGNOSIS — J441 Chronic obstructive pulmonary disease with (acute) exacerbation: Secondary | ICD-10-CM | POA: Diagnosis not present

## 2017-10-24 DIAGNOSIS — J441 Chronic obstructive pulmonary disease with (acute) exacerbation: Secondary | ICD-10-CM | POA: Diagnosis not present

## 2017-10-24 DIAGNOSIS — J449 Chronic obstructive pulmonary disease, unspecified: Secondary | ICD-10-CM | POA: Diagnosis not present

## 2017-10-24 DIAGNOSIS — J45909 Unspecified asthma, uncomplicated: Secondary | ICD-10-CM | POA: Diagnosis not present

## 2017-10-24 DIAGNOSIS — G4733 Obstructive sleep apnea (adult) (pediatric): Secondary | ICD-10-CM | POA: Diagnosis not present

## 2017-10-26 ENCOUNTER — Other Ambulatory Visit: Payer: Self-pay | Admitting: Family Medicine

## 2017-10-30 ENCOUNTER — Ambulatory Visit: Payer: Self-pay

## 2017-10-30 ENCOUNTER — Telehealth: Payer: Self-pay

## 2017-10-30 NOTE — Telephone Encounter (Signed)
Left message to return call to inform patient wife we ar able to see the patient on 11/02/17 at 10 am. Patients spouse states he has been sleeping more then usual and she is concerned it is due to depression

## 2017-10-30 NOTE — Telephone Encounter (Signed)
Patient is scheduled   

## 2017-10-30 NOTE — Telephone Encounter (Signed)
[name] called and advised of the appointment for Thursday, 11/02/17 at 1000, she says she will take that appointment for her husband.

## 2017-10-30 NOTE — Telephone Encounter (Signed)
[name] called and she says she wanted her husband to see Dr. [name] about his depression, I advised of an appointment per notes of Thurmond Butts, CMA in another encounter, patient's wife verbalized understanding.

## 2017-11-02 ENCOUNTER — Ambulatory Visit (INDEPENDENT_AMBULATORY_CARE_PROVIDER_SITE_OTHER): Payer: PPO | Admitting: Family Medicine

## 2017-11-02 ENCOUNTER — Encounter: Payer: Self-pay | Admitting: Family Medicine

## 2017-11-02 ENCOUNTER — Telehealth: Payer: Self-pay | Admitting: Internal Medicine

## 2017-11-02 VITALS — BP 144/60 | HR 67 | Temp 97.6°F | Wt 246.2 lb

## 2017-11-02 DIAGNOSIS — R252 Cramp and spasm: Secondary | ICD-10-CM | POA: Diagnosis not present

## 2017-11-02 DIAGNOSIS — R0600 Dyspnea, unspecified: Secondary | ICD-10-CM | POA: Diagnosis not present

## 2017-11-02 DIAGNOSIS — F32A Depression, unspecified: Secondary | ICD-10-CM

## 2017-11-02 DIAGNOSIS — F419 Anxiety disorder, unspecified: Secondary | ICD-10-CM | POA: Diagnosis not present

## 2017-11-02 DIAGNOSIS — K2270 Barrett's esophagus without dysplasia: Secondary | ICD-10-CM

## 2017-11-02 DIAGNOSIS — F329 Major depressive disorder, single episode, unspecified: Secondary | ICD-10-CM | POA: Diagnosis not present

## 2017-11-02 LAB — BASIC METABOLIC PANEL
BUN: 11 mg/dL (ref 6–23)
CO2: 31 mEq/L (ref 19–32)
Calcium: 9.1 mg/dL (ref 8.4–10.5)
Chloride: 101 mEq/L (ref 96–112)
Creatinine, Ser: 0.91 mg/dL (ref 0.40–1.50)
GFR: 85.21 mL/min (ref >60.00)
Glucose, Bld: 133 mg/dL — ABNORMAL HIGH (ref 70–99)
Potassium: 3.5 mEq/L (ref 3.5–5.1)
Sodium: 139 mEq/L (ref 135–145)

## 2017-11-02 NOTE — Patient Instructions (Signed)
Nice to see you. We will get you to see the psychologist.  I will need to check with our clinical pharmacist regarding the Lexapro given your age. Please make sure you follow-up with GI. Please follow-up with pulmonology as planned.   If you develop worsening breathing issues or chest pressure or thoughts of harming yourself please go to the emergency room.

## 2017-11-02 NOTE — Telephone Encounter (Signed)
Patient calling to discuss recent testing results  ° °Please call  ° °

## 2017-11-02 NOTE — Assessment & Plan Note (Signed)
He is waiting to be cleared by cardiology to proceed with repeat evaluation for this.

## 2017-11-02 NOTE — Telephone Encounter (Signed)
Returned call to patient re: ono results. We have not received results yet. Placed call to [facility] requesting report be sent.   Also patient requesting results of cxr on 10/10/17.

## 2017-11-02 NOTE — Assessment & Plan Note (Signed)
Possibly related to muscle spasms or muscle cramps.  We will check a BMP.

## 2017-11-02 NOTE — Assessment & Plan Note (Signed)
Patient with fairly significant depression and anxiety.  It appears he is on max dose Lexapro though we will discuss with our clinical pharmacist.  We will refer to therapy.  Given return precautions.

## 2017-11-02 NOTE — Assessment & Plan Note (Signed)
I suspect this is possibly anxiety driven as it tends to occur when he is anxious.  He does not seem to have any significant dyspnea on exertion.  We will treat his anxiety and depression.  He will contact pulmonology to follow-up on the overnight pulse oximetry test he did.

## 2017-11-02 NOTE — Progress Notes (Signed)
[name], MD Phone: [phone]  Andre [name] is a 82 y.o. male who presents today for f/u.  CC: Depression, anxiety, shortness of breath, muscle pain, Barrett's esophagus  Depression/anxiety: Improved when he saw cardiology at the end of June though has worsened since then.  Patient notes thinking about it not getting better makes it worse.  He does have some anxiety.  He does have thoughts that he might be better off not being here though he has no thoughts of killing himself.  He notes he would never do this given religious thoughts.  He has been trying to increase his activities to see if that would help.  He continues on Lexapro 10 mg.  Dyspnea: This is chronic and stable.  He feels better on his oxygen.  He has had no chest pressure.  No wheezing.  He notes he feels as though he has trouble breathing when he gets anxious.  Notes he typically does not notice dyspnea on exertion recently.  Patient notes scattered areas of a stabbing discomfort that last for a second or 2.  It has been going on over the last week.  Occurs in his muscles.  It will move around and sometimes be in his legs sometimes in his back one time in his left chest.  Goes away as quickly as it comes on.  He is trying to stay hydrated.  Social History   Tobacco Use  Smoking Status Former Smoker  . Packs/day: 1.00  . Years: 40.00  . Pack years: 40.00  . Types: Cigarettes, Pipe, Cigars  . Last attempt to quit: 04/18/1972  . Years since quitting: 45.5  Smokeless Tobacco Former Systems developer  . Types: Chew  . Quit date: 04/18/1972     ROS see history of present illness  Objective  Physical Exam Vitals:   11/02/17 0955  BP: (!) 144/60  Pulse: 67  Temp: 97.6 F (36.4 C)  SpO2: 94%    BP Readings from Last 3 Encounters:  11/02/17 (!) 144/60  10/09/17 120/70  10/03/17 (!) 144/84   Wt Readings from Last 3 Encounters:  11/02/17 246 lb 3.2 oz (111.7 kg)  10/09/17 243 lb 8 oz (110.5 kg)  10/03/17 245 lb  (111.1 kg)    Physical Exam  Constitutional: No distress.  Cardiovascular: Normal rate, regular rhythm and normal heart sounds.  Pulmonary/Chest: Effort normal and breath sounds normal. He exhibits no tenderness.  Musculoskeletal: He exhibits no edema.  Neurological: He is alert.  Skin: Skin is warm and dry. He is not diaphoretic.     Assessment/Plan: Please see individual problem list.  Barrett's esophagus He is waiting to be cleared by cardiology to proceed with repeat evaluation for this.  Dyspnea I suspect this is possibly anxiety driven as it tends to occur when he is anxious.  He does not seem to have any significant dyspnea on exertion.  We will treat his anxiety and depression.  He will contact pulmonology to follow-up on the overnight pulse oximetry test he did.  Anxiety and depression Patient with fairly significant depression and anxiety.  It appears he is on max dose Lexapro though we will discuss with our clinical pharmacist.  We will refer to therapy.  Given return precautions.  Muscle cramps Possibly related to muscle spasms or muscle cramps.  We will check a BMP.   Orders Placed This Encounter  Procedures  . Basic Metabolic Panel (BMET)  . Ambulatory referral to Psychology    Referral Priority:   Routine  Referral Type:   Psychiatric    Referral Reason:   Specialty Services Required    Requested Specialty:   Psychology    Number of Visits Requested:   1    No orders of the defined types were placed in this encounter.    [name], MD [place]

## 2017-11-03 DIAGNOSIS — J441 Chronic obstructive pulmonary disease with (acute) exacerbation: Secondary | ICD-10-CM | POA: Diagnosis not present

## 2017-11-03 DIAGNOSIS — J45909 Unspecified asthma, uncomplicated: Secondary | ICD-10-CM | POA: Diagnosis not present

## 2017-11-03 DIAGNOSIS — J849 Interstitial pulmonary disease, unspecified: Secondary | ICD-10-CM | POA: Diagnosis not present

## 2017-11-03 DIAGNOSIS — G4733 Obstructive sleep apnea (adult) (pediatric): Secondary | ICD-10-CM | POA: Diagnosis not present

## 2017-11-03 DIAGNOSIS — J449 Chronic obstructive pulmonary disease, unspecified: Secondary | ICD-10-CM | POA: Diagnosis not present

## 2017-11-06 ENCOUNTER — Other Ambulatory Visit: Payer: Self-pay | Admitting: Cardiovascular Disease

## 2017-11-06 ENCOUNTER — Telehealth: Payer: Self-pay | Admitting: Internal Medicine

## 2017-11-06 DIAGNOSIS — J9601 Acute respiratory failure with hypoxia: Secondary | ICD-10-CM

## 2017-11-06 NOTE — Telephone Encounter (Signed)
It looked significantly better than the previous chest x ray.

## 2017-11-06 NOTE — Telephone Encounter (Signed)
Phone call rec'd by [facility] office stating they were having trouble with getting order taken care of for patient's O2 needs and CPAP concerns. Spoke with Corene Cornea at [facility] and order has been taken care of. Corene Cornea will get this set up today.

## 2017-11-06 NOTE — Telephone Encounter (Signed)
Patient notified.ss

## 2017-11-07 ENCOUNTER — Telehealth: Payer: Self-pay | Admitting: Neurology

## 2017-11-07 NOTE — Telephone Encounter (Signed)
The patient is having a lot of daytime drowsiness.  This issue was discussed back in April 2019 on his last visit.  We had gone down on the Sinemet and added Comtan to see if the drowsiness could be improved but it has continued.  The patient has CPAP for sleep apnea, it is not clear that this has been reevaluated recently.  They may want to go to their sleep doctor to have this evaluated, if there are no adjustments to be made, it is possible that the use of Provigil or Nuvigil may help his drowsiness.

## 2017-11-07 NOTE — Telephone Encounter (Signed)
Pt wife-Privott,[phone] has called with concern about pt sleeping a lot example last night pt slept from 9;30 last night to 9:30 this a.m. And has slept throughout the day. Wife concerned please call

## 2017-11-08 ENCOUNTER — Telehealth: Payer: Self-pay | Admitting: Internal Medicine

## 2017-11-08 NOTE — Telephone Encounter (Signed)
Pt wife on DPR-Fludd,Patricia @ [phone] has called asking for Dr Jannifer Franklin to call her to address concerns about pt's Parkinson's worsening.  Wife is asking if there is something that can be given to help slow down the tremors. Please call

## 2017-11-08 NOTE — Telephone Encounter (Signed)
Spoke with patient and his spouse and advised per Dr. Mathis Fare response.

## 2017-11-08 NOTE — Telephone Encounter (Signed)
Reviewed download, sleep apnea is controlled on current level of CPAP. I suspect that he is tired lot because his underlying medical problems and medications.

## 2017-11-08 NOTE — Telephone Encounter (Signed)
Patient wife calling about pressure settings on CPAP machine .  Please call .  Wife is concerned about varied hours of sleep sometimes the patient sleeps 15 hrs

## 2017-11-08 NOTE — Telephone Encounter (Signed)
I called the patient and talked with the wife, The patient has had some worsening symptoms over the last 2 weeks, he does have tremors, but the wife indicates that he is jerking and twitching. I will need to evaluate the patient sometime soon. I will get a RV for him to come in.

## 2017-11-09 NOTE — Telephone Encounter (Signed)
Called wife. Scheduled appt with Dr. Jannifer Franklin on 11/13/17 at 4pm, check in 330pm. She verbalized understanding.

## 2017-11-13 ENCOUNTER — Other Ambulatory Visit: Payer: Self-pay

## 2017-11-13 ENCOUNTER — Encounter: Payer: Self-pay | Admitting: Neurology

## 2017-11-13 ENCOUNTER — Ambulatory Visit: Payer: PPO | Admitting: Neurology

## 2017-11-13 VITALS — BP 151/78 | HR 63 | Ht 67.5 in | Wt 243.5 lb

## 2017-11-13 DIAGNOSIS — G20 Parkinson's disease: Secondary | ICD-10-CM

## 2017-11-13 MED ORDER — CARBIDOPA-LEVODOPA 25-250 MG PO TABS: 1.5000 | ORAL_TABLET | Freq: Three times a day (TID) | ORAL | 3 refills | Status: DC

## 2017-11-13 MED ORDER — MODAFINIL 200 MG PO TABS
200.0000 mg | ORAL_TABLET | Freq: Every day | ORAL | 3 refills | Status: DC
Start: 2017-11-13 — End: 2018-02-05

## 2017-11-13 NOTE — Progress Notes (Signed)
Reason for visit: Parkinson's disease  Andre [name] is an 82 y.o. male  History of present illness:  Andre [name] is an 82 year old right-handed white male with a history of Parkinson's disease.  The patient has been on Sinemet taking the 25/250 mg tablets, 2 tablets 3 times daily.  The patient has been on Comtan as well with the Sinemet.  He has developed over the last 6 or 8 weeks problems with dyskinesias affecting the head and neck, tongue, and arms and legs.  The patient feels more unstable, because of the excessive neck movement he has developed some increased neck pain, he has known underlying cervical spondylosis.  The patient last had MRI of the cervical spine in 2016.  The patient has not had any recent falls.  He does have congestive heart failure, there is some question of depression.  The patient will be seeing a therapist in the near future.  The patient has excessive daytime drowsiness, he sleeps 14 hours a day.  He does have sleep apnea on CPAP.  His CPAP is been recently evaluated, it is felt to be adequate.  Past Medical History:  Diagnosis Date  . Cervical spondylosis 10/01/2013  . Chronic diastolic CHF (congestive heart failure) ([place])    a. 07/2016 Echo: >55%; b. 10/2016 Echo: EF 55-60%, Gr1 DD, Ao sclerosis w/o stenosis, sev dil LA; c. 08/2017 Echo: EF 60-65%, no rwma, Gr2 DD, mild AS, sev dil LA/RA.  [name] Coronary artery disease    a. 1998 s/p mini-cabg @ [facility] - LIMA->LAD;  b. 07/2016 St Echo: Inadequate HR w/ HTN response;  c.  08/2016 MV: EF 67%, no ischemia; d. 10/2016 NSTEMI/Cath: RCA 95p (4.0x26 Onyx DES), LIMA->LAD nl; e. 09/2017 Cath: LM 40/30, LAD 100ost, RI 80, LCX nl, OM2/3 nl, RCA patent stent, 87m->Med Rx.  . Depression   . GERD (gastroesophageal reflux disease)   . Hyperlipidemia   . Hypertension   . PAF (paroxysmal atrial fibrillation) (HCC)    a. s/p DCCV-->maintaining sinus on amiodarone;  b. CHA2DS2VASc = 5-->eliquis.  . Parkinson's disease ([place])    tremors  .  Pleural effusion   . Pleural effusion, right    a. 09/2017 s/p thoracentesis.  . Pulmonary embolism ([place]) 2011  . 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 Sleep apnea    wears CPAP    Past Surgical History:  Procedure Laterality Date  . BACK SURGERY  1960  . CARDIAC CATHETERIZATION    . CHOLECYSTECTOMY  2010  . CORONARY ARTERY BYPASS GRAFT  01/07/1997  . CORONARY STENT INTERVENTION N/A 10/31/2016   Procedure: Coronary Stent Intervention;  Surgeon: Wellington Hampshire, MD;  Location: [facility];  Service: Cardiovascular;  Laterality: N/A;  . ELECTROPHYSIOLOGIC STUDY N/A 07/14/2015   Procedure: CARDIOVERSION;  Surgeon: Yolonda Kida, MD;  Location: [facility];  Service: Cardiovascular;  Laterality: N/A;  . ELECTROPHYSIOLOGIC STUDY N/A 10/12/2015   Procedure: CARDIOVERSION;  Surgeon: Minna Merritts, MD;  Location: [facility];  Service: Cardiovascular;  Laterality: N/A;  . LEFT HEART CATH AND CORONARY ANGIOGRAPHY N/A 10/31/2016   Procedure: Left Heart Cath and Coronary Angiography;  Surgeon: Wellington Hampshire, MD;  Location: [facility];  Service: Cardiovascular;  Laterality: N/A;  . OTHER SURGICAL HISTORY  1998   Bypass  . RIGHT/LEFT HEART CATH AND CORONARY ANGIOGRAPHY N/A 09/18/2017   Procedure: RIGHT/LEFT HEART CATH AND CORONARY ANGIOGRAPHY;  Surgeon: Wellington Hampshire, MD;  Location: [facility];  Service: Cardiovascular;  Laterality: N/A;    Family History  Problem Relation Age of Onset  . Alcohol abuse Father     Social history:  reports that he quit smoking about 45 years ago. His smoking use included cigarettes, pipe, and cigars. He has a 40.00 pack-year smoking history. He quit smokeless tobacco use about 45 years ago. His smokeless tobacco use included chew. He reports that he drinks about 2.4 oz of alcohol per week. He reports that he does not use drugs.    Allergies  Allergen Reactions  . Pravastatin Other (See Comments)  . Prednisone Other (See  Comments)    Pt states that med makes him hyper Pt states that med makes him hyper    Medications:  Prior to Admission medications   Medication Sig Start Date End Date Taking? Authorizing Provider  albuterol (PROVENTIL) (2.5 MG/3ML) 0.083% nebulizer solution Take 3 mLs (2.5 mg total) by nebulization every 4 (four) hours as needed for wheezing or shortness of breath. Patient taking differently: Take 2.5 mg by nebulization as needed for wheezing or shortness of breath.  08/24/17   Flora Lipps, MD  carbidopa-levodopa (SINEMET) 25-250 MG tablet Take 1.5 tablets by mouth 3 (three) times daily. 11/13/17   Kathrynn Ducking, MD  carvedilol (COREG) 3.125 MG tablet Take 2 tablets (6.25 mg total) by mouth 2 (two) times daily with a meal. 09/21/17 11/20/17  Henreitta Leber, MD  clopidogrel (PLAVIX) 75 MG tablet TAKE ONE TABLET BY MOUTH EVERY DAY WITH BREAKFAST 07/06/17   Wellington Hampshire, MD  cyclobenzaprine (FLEXERIL) 5 MG tablet Take 5 mg by mouth daily as needed for muscle spasms.     [provider]  ELIQUIS 5 MG TABS tablet TAKE ONE TABLET TWICE DAILY 02/06/17   Wellington Hampshire, MD  entacapone (COMTAN) 200 MG tablet Take 200 mg by mouth 3 (three) times daily. 10/25/17   [provider]  EPINEPHrine 0.3 mg/0.3 mL IJ SOAJ injection Inject 0.3 mg as directed once as needed (allergic reaction). Reported on 10/27/2015 07/01/13   [provider]  escitalopram (LEXAPRO) 10 MG tablet Take 1 tablet (10 mg total) by mouth daily. 09/12/17   [name], MD  esomeprazole (NEXIUM) 20 MG capsule Take 40 mg by mouth daily at 12 noon.    [provider]  fluticasone (FLONASE) 50 MCG/ACT nasal spray TAKE 2 PUFFS IN EACH NOSTRIL EVERY DAY Patient taking differently: TAKE 2 PUFFS IN EACH NOSTRIL EVERY DAY, takes as prn 10/18/16   Cook, [place] G, DO  furosemide (LASIX) 20 MG tablet TAKE TWO TABLETS BY MOUTH TWICE DAILY 11/06/17   Wellington Hampshire, MD  isosorbide mononitrate (IMDUR) 30 MG  24 hr tablet Take 1 tablet (30 mg total) by mouth daily. 09/21/17 11/20/17  Henreitta Leber, MD  levothyroxine (SYNTHROID, LEVOTHROID) 50 MCG tablet TAKE 1 TABLET EVERY DAY ON EMPTY STOMACHWITH A GLASS OF WATER AT LEAST 30-60 MINBEFORE BREAKFAST 10/27/17   [name], MD  losartan (COZAAR) 25 MG tablet Take 1 tablet (25 mg total) by mouth daily. 09/21/17 11/20/17  Henreitta Leber, MD  mirabegron ER (MYRBETRIQ) 50 MG TB24 tablet Take 50 mg by mouth daily.    [provider]  modafinil (PROVIGIL) 200 MG tablet Take 1 tablet (200 mg total) by mouth daily. 11/13/17   Kathrynn Ducking, MD  Polyethyl Glycol-Propyl Glycol (SYSTANE OP) Place 1 drop into both eyes 2 (two) times daily as needed (dry eyes).    [provider]  potassium chloride SA (K-DUR,KLOR-CON) 20 MEQ tablet Take 1 tablet (20 mEq total) by mouth daily. 09/06/17   Wellington Hampshire, MD  rosuvastatin (CRESTOR) 10 MG tablet Take 10 mg by mouth at bedtime.    [provider]  VENTOLIN HFA 108 (90 Base) MCG/ACT inhaler TAKE 2 PUFFS EVERY 8 HOURS AS NEEDED FORWHEEZING 12/02/16   Coral Spikes, DO    ROS:  Out of a complete 14 system review of symptoms, the patient complains only of the following symptoms, and all other reviewed systems are negative.  Fatigue, excessive sweating Hearing loss, ringing in ears, difficulty swallowing, drooling Blurred vision Shortness of breath, choking Constipation Sleep apnea, acting out dreams Walking difficulty, neck pain, neck stiffness Agitation Depression, anxiety  Blood pressure (!) 151/78, pulse 63, height 5' 7.5" (1.715 m), weight 243 lb 8 oz (110.5 kg), SpO2 95 %.  Physical Exam  General: The patient is alert and cooperative at the time of the examination.  The patient is markedly obese.  Skin: No significant peripheral edema is noted.   Neurologic Exam  Mental status: The patient is alert and oriented x 3 at the time of the examination. The patient has  apparent normal recent and remote memory, with an apparently normal attention span and concentration ability.   Cranial nerves: Facial symmetry is present. Speech is normal, no aphasia or dysarthria is noted. Extraocular movements are full. Visual fields are full.  Motor: The patient has good strength in all 4 extremities.  Sensory examination: Soft touch sensation is symmetric on the face, arms, and legs.  Coordination: The patient has good finger-nose-finger and heel-to-shin bilaterally.  Dyskinesias as noted on the head and neck, arms and legs, left greater than right.  Gait and station: The patient has the ability to walk independently, the dyskinesias are present while walking, patient has mild gait instability.  Tandem gait was not tested. Romberg is negative. No drift is seen.  Reflexes: Deep tendon reflexes are symmetric.   Assessment/Plan:  1.  Parkinson's disease  2.  Dyskinesias, new onset  3.  Neck pain, chronic  4.  Sleep apnea on CPAP  The patient is having a lot of fatigue and drowsiness during the day, he is on CPAP at night.  We will start Provigil 200 mg daily for the fatigue.  The patient is having dyskinesias which is worsening his gait stability and increasing neck pain.  The Sinemet dose will be reduced to 1.5 of the 25/250 mg tablets 3 times daily.  The patient will follow-up for his next scheduled visit on 05 February 2018.  The patient will contact me in about 4 weeks if he is not getting better with the dyskinesias, we may add amantadine at that time.  Jill Alexanders MD 11/13/2017 4:23 PM  [facility] [address] [place], [place] [address]  Phone [phone] Fax [phone]

## 2017-11-13 NOTE — Patient Instructions (Signed)
Reduce the Sinemet (carbidopa) to 1.5 tablets three times a day.  We will start provigil 200 mg in the morning.

## 2017-11-14 ENCOUNTER — Telehealth: Payer: Self-pay | Admitting: *Deleted

## 2017-11-14 NOTE — Telephone Encounter (Signed)
Submitted PA on covermymeds. Key: [id number]. Waiting on determination.   "[employer] has received your information, and the request will be reviewed.  If you have any questions please contact [employer] at [phone]."

## 2017-11-15 ENCOUNTER — Telehealth: Payer: Self-pay | Admitting: Family Medicine

## 2017-11-15 NOTE — Telephone Encounter (Signed)
PA approved effective 11/15/17-04/17/18. Faxed notice of approval to [facility] at [phone]. Received fax confirmation.

## 2017-11-15 NOTE — Telephone Encounter (Unsigned)
Copied from [place] [id number]. Topic: Quick Communication - See Telephone Encounter >> Nov 15, 2017  4:38 PM Percell Belt A wrote: CRM for notification. See Telephone encounter for: 11/15/17.   Pt wife called and stated that the pt goes to [facility] on 11/22/2017 @12  and they are needing any note pertaining to the disk in his neck.  Pt may have possible surgery on his neck  They are need all notes faxed to: [phone] Dr Rudean Curt

## 2017-11-16 NOTE — Telephone Encounter (Signed)
I do not believe we have discussed this issue recently. He has seen Dr Sharlet Salina regarding his neck and they could request records from his office.

## 2017-11-16 NOTE — Telephone Encounter (Signed)
Please advise I looked at the last few notes and did not see any mention of neck pain

## 2017-11-16 NOTE — Telephone Encounter (Signed)
Please advise, I do not see notes mentioning this

## 2017-11-16 NOTE — Telephone Encounter (Signed)
Patients wife notified

## 2017-11-17 ENCOUNTER — Ambulatory Visit (INDEPENDENT_AMBULATORY_CARE_PROVIDER_SITE_OTHER): Payer: PPO | Admitting: Psychology

## 2017-11-17 DIAGNOSIS — F3289 Other specified depressive episodes: Secondary | ICD-10-CM | POA: Diagnosis not present

## 2017-11-21 ENCOUNTER — Other Ambulatory Visit: Payer: Self-pay | Admitting: Cardiovascular Disease

## 2017-11-22 ENCOUNTER — Other Ambulatory Visit: Payer: Self-pay | Admitting: Cardiovascular Disease

## 2017-11-22 DIAGNOSIS — M542 Cervicalgia: Secondary | ICD-10-CM | POA: Diagnosis not present

## 2017-11-22 DIAGNOSIS — M4312 Spondylolisthesis, cervical region: Secondary | ICD-10-CM | POA: Diagnosis not present

## 2017-11-22 DIAGNOSIS — M47812 Spondylosis without myelopathy or radiculopathy, cervical region: Secondary | ICD-10-CM | POA: Diagnosis not present

## 2017-11-22 DIAGNOSIS — M4802 Spinal stenosis, cervical region: Secondary | ICD-10-CM | POA: Diagnosis not present

## 2017-11-24 DIAGNOSIS — J449 Chronic obstructive pulmonary disease, unspecified: Secondary | ICD-10-CM | POA: Diagnosis not present

## 2017-11-24 DIAGNOSIS — G4733 Obstructive sleep apnea (adult) (pediatric): Secondary | ICD-10-CM | POA: Diagnosis not present

## 2017-11-24 DIAGNOSIS — J441 Chronic obstructive pulmonary disease with (acute) exacerbation: Secondary | ICD-10-CM | POA: Diagnosis not present

## 2017-11-24 DIAGNOSIS — J45909 Unspecified asthma, uncomplicated: Secondary | ICD-10-CM | POA: Diagnosis not present

## 2017-11-27 ENCOUNTER — Ambulatory Visit (INDEPENDENT_AMBULATORY_CARE_PROVIDER_SITE_OTHER): Payer: PPO | Admitting: Family Medicine

## 2017-11-27 ENCOUNTER — Encounter: Payer: Self-pay | Admitting: Family Medicine

## 2017-11-27 VITALS — BP 140/62 | HR 63 | Temp 97.8°F | Ht 68.0 in | Wt 247.0 lb

## 2017-11-27 DIAGNOSIS — R0600 Dyspnea, unspecified: Secondary | ICD-10-CM

## 2017-11-27 DIAGNOSIS — G20 Parkinson's disease: Secondary | ICD-10-CM | POA: Diagnosis not present

## 2017-11-27 DIAGNOSIS — F419 Anxiety disorder, unspecified: Secondary | ICD-10-CM

## 2017-11-27 DIAGNOSIS — F329 Major depressive disorder, single episode, unspecified: Secondary | ICD-10-CM

## 2017-11-27 DIAGNOSIS — F32A Depression, unspecified: Secondary | ICD-10-CM

## 2017-11-27 NOTE — Progress Notes (Signed)
  [name], MD Phone: [phone]  Andre [name] is a 82 y.o. male who presents today for f/u.  CC: andxiety/depression, parkinsons, dyspnea  Anxiety/depression: Patient notes anxiety is a bit better.  Right now he is just jumpy.  He notes he is not as depressed as he was previously though he does not feel like he is worth much of anything given that he cannot do anything with regards to his dyspnea.  He notes no SI.  He is on Lexapro.  He is seeing a therapist and is trying to figure out if he wants to see them a second time.  Parkinson's disease: He recently saw his neurologist.  It sounds as though they placed him on Provigil and decreased his Sinemet dose.  These things have helped with his jerking.  Dyspnea: He continues to have dyspnea on exertion.  Notes it is a little bit better.  He is wearing oxygen at night.  He has seen cardiology and reports they advised him there was no cardiac cause for his symptoms.  He has also seen pulmonology and reports he had no identified cause through them either.  It does appear he has restrictive lung disease.  Denies orthopnea.  No swelling.  No chest pain.  Social History   Tobacco Use  Smoking Status Former Smoker  . Packs/day: 1.00  . Years: 40.00  . Pack years: 40.00  . Types: Cigarettes, Pipe, Cigars  . Last attempt to quit: 04/18/1972  . Years since quitting: 45.6  Smokeless Tobacco Former Systems developer  . Types: Chew  . Quit date: 04/18/1972     ROS see history of present illness  Objective  Physical Exam Vitals:   11/27/17 0953  BP: 140/62  Pulse: 63  Temp: 97.8 F (36.6 C)  SpO2: 96%    BP Readings from Last 3 Encounters:  11/27/17 140/62  11/13/17 (!) 151/78  11/02/17 (!) 144/60   Wt Readings from Last 3 Encounters:  11/27/17 247 lb (112 kg)  11/13/17 243 lb 8 oz (110.5 kg)  11/02/17 246 lb 3.2 oz (111.7 kg)    Physical Exam  Constitutional: No distress.  Cardiovascular: Normal rate, regular rhythm and normal  heart sounds.  Pulmonary/Chest: Effort normal and breath sounds normal.  Musculoskeletal: He exhibits no edema.  Neurological: He is alert.  Skin: Skin is warm and dry. He is not diaphoretic.     Assessment/Plan: Please see individual problem list.  Anxiety and depression Mildly improved.  He will continue Lexapro.  I encouraged him to continue with therapy.  Dyspnea Chronic issue.  Possibly a little bit better.  No obvious cardiac cause and he continues to follow with cardiology.  Could be related to restrictive lung disease.  He is interested in a second opinion from pulmonology.  We will have him see pulmonology in [place] if possible.  Referral placed.  Parkinson's disease [facility]) Patient recently had medication changes through neurology.  He will continue to see them.   Orders Placed This Encounter  Procedures  . Ambulatory referral to Pulmonology    Referral Priority:   Routine    Referral Type:   Consultation    Referral Reason:   Specialty Services Required    Requested Specialty:   Pulmonary Disease    Number of Visits Requested:   1    No orders of the defined types were placed in this encounter.    [name], MD [place]

## 2017-11-27 NOTE — Patient Instructions (Signed)
Nice to see you. We will get you to see a different pulmonologist to get a second opinion. I would suggest she see the therapist again.

## 2017-11-27 NOTE — Assessment & Plan Note (Signed)
Mildly improved.  He will continue Lexapro.  I encouraged him to continue with therapy.

## 2017-11-27 NOTE — Assessment & Plan Note (Signed)
Chronic issue.  Possibly a little bit better.  No obvious cardiac cause and he continues to follow with cardiology.  Could be related to restrictive lung disease.  He is interested in a second opinion from pulmonology.  We will have him see pulmonology in [place] if possible.  Referral placed.

## 2017-11-27 NOTE — Assessment & Plan Note (Signed)
Patient recently had medication changes through neurology.  He will continue to see them.

## 2017-11-28 ENCOUNTER — Ambulatory Visit: Payer: PPO | Admitting: Psychology

## 2017-11-28 DIAGNOSIS — F3289 Other specified depressive episodes: Secondary | ICD-10-CM

## 2017-11-29 ENCOUNTER — Other Ambulatory Visit: Payer: Self-pay | Admitting: Physician Assistant

## 2017-11-29 ENCOUNTER — Other Ambulatory Visit: Payer: Self-pay | Admitting: *Deleted

## 2017-11-29 DIAGNOSIS — M542 Cervicalgia: Secondary | ICD-10-CM

## 2017-11-29 DIAGNOSIS — M4802 Spinal stenosis, cervical region: Secondary | ICD-10-CM

## 2017-12-01 ENCOUNTER — Encounter: Payer: Self-pay | Admitting: Nurse Practitioner

## 2017-12-01 ENCOUNTER — Telehealth: Payer: Self-pay

## 2017-12-01 DIAGNOSIS — H6122 Impacted cerumen, left ear: Secondary | ICD-10-CM | POA: Diagnosis not present

## 2017-12-01 DIAGNOSIS — H9319 Tinnitus, unspecified ear: Secondary | ICD-10-CM | POA: Diagnosis not present

## 2017-12-01 DIAGNOSIS — H90A32 Mixed conductive and sensorineural hearing loss, unilateral, left ear with restricted hearing on the contralateral side: Secondary | ICD-10-CM | POA: Diagnosis not present

## 2017-12-01 NOTE — Telephone Encounter (Signed)
Copied from [place] [phone]. Topic: Referral - Request >> Dec 01, 2017  1:31 PM Hewitt Shorts wrote: Pt  is requesting that a referral to [facility] be sent he does not want to see the provider in [facility]   Dr. Cresenciano Lick  [phone]   Best number [phone]

## 2017-12-01 NOTE — Telephone Encounter (Signed)
This encounter was created in error - please disregard.

## 2017-12-04 ENCOUNTER — Other Ambulatory Visit: Payer: Self-pay | Admitting: *Deleted

## 2017-12-04 ENCOUNTER — Encounter: Payer: Self-pay | Admitting: *Deleted

## 2017-12-04 DIAGNOSIS — J849 Interstitial pulmonary disease, unspecified: Secondary | ICD-10-CM | POA: Diagnosis not present

## 2017-12-04 DIAGNOSIS — J45909 Unspecified asthma, uncomplicated: Secondary | ICD-10-CM | POA: Diagnosis not present

## 2017-12-04 DIAGNOSIS — J449 Chronic obstructive pulmonary disease, unspecified: Secondary | ICD-10-CM | POA: Diagnosis not present

## 2017-12-04 DIAGNOSIS — G4733 Obstructive sleep apnea (adult) (pediatric): Secondary | ICD-10-CM | POA: Diagnosis not present

## 2017-12-04 DIAGNOSIS — J441 Chronic obstructive pulmonary disease with (acute) exacerbation: Secondary | ICD-10-CM | POA: Diagnosis not present

## 2017-12-04 NOTE — Patient Outreach (Signed)
[place] [facility]) Care Management [place] Telephone Outreach  12/04/2017  Andre [name] 16-Jun-1935 [id number]  Unsuccessful telephone outreach attempt to Andre [name], 82 y/o male referred to [place] by insurance provider; patient has history including, but not limited to, pAF; Parkinson's Disease; CAD with previous CABG; HTN/ HLD; d CHF; OSA; GERD; DDD; and anxiety/ depression.  Noted through review of EMR that patient had recent hospitalization June 3-6, 2019 for shortness of breath/ dyspnea secondary to pleural effusion, requiring thoracentesis.  HIPAA compliant voice mail message left for patient, requesting return call back.  Plan:  [facility] unsuccessful patient outreach letter in mail requesting call back in writing  Will re-attempt [place] telephone outreach later this week if I do not hear back from patient first.  Oneta Rack, RN, BSN, [place] Coordinator [facility] Care Management  [phone]

## 2017-12-04 NOTE — Telephone Encounter (Signed)
Faxed to [place] [phone] they will call pt to schedule

## 2017-12-04 NOTE — Patient Outreach (Signed)
[place] [facility]) Care Management  12/04/2017  Andre [name] Feb 13, 1936 [id number]  Entry Error-- please disregard/ see note from earlier today  Oneta Rack, RN, BSN, [place] Coordinator [facility] Care Management  [phone]

## 2017-12-06 ENCOUNTER — Encounter: Payer: Self-pay | Admitting: *Deleted

## 2017-12-06 ENCOUNTER — Other Ambulatory Visit: Payer: Self-pay | Admitting: *Deleted

## 2017-12-06 NOTE — Patient Outreach (Signed)
[place] [facility]) Care Management [facility] CM Telephone Outreach Unsuccessful Outreach attempt #2  12/06/2017  Andre [name] 04/12/1936 [id number]  Unsuccessful telephone outreach attempt to [name], 81 y/o male referred to [place] by insurance provider; patient has history including, but not limited to, pAF; Parkinson's Disease; CAD with previous CABG; HTN/ HLD; d CHF; OSA; GERD; DDD; and anxiety/ depression.  Noted through review of EMR that patient had recent hospitalization June 3-6, 2019 for shortness of breath/ dyspnea secondary to pleural effusion, requiring thoracentesis.  HIPAA compliant voice mail message left for patient, requesting return call back.  Plan:  Verified [place] unsuccessful patient outreach letter requesting call back in writing was mailed to patient earlier this week  Will re-attempt [place] telephone outreach again next week if I do not hear back from patient first.  Oneta Rack, RN, BSN, [employer] Coordinator [facility] Care Management  [phone]

## 2017-12-07 ENCOUNTER — Telehealth: Payer: Self-pay

## 2017-12-07 NOTE — Telephone Encounter (Signed)
Copied from [place] [id number]. Topic: Quick Communication - See Telephone Encounter >> Dec 07, 2017  2:42 PM Hewitt Shorts wrote: Andre [name] is calling from [facility] to let provider know that the pt is not answering call   Best number is [phone]

## 2017-12-07 NOTE — Telephone Encounter (Signed)
fyi

## 2017-12-08 NOTE — Telephone Encounter (Signed)
Informed patients wife and they will give them a call

## 2017-12-08 NOTE — Telephone Encounter (Signed)
Noted. Please contact the patient and let him know they are trying to get in touch with him. Thanks.

## 2017-12-12 ENCOUNTER — Other Ambulatory Visit: Payer: Self-pay

## 2017-12-12 ENCOUNTER — Encounter: Payer: Self-pay | Admitting: *Deleted

## 2017-12-12 ENCOUNTER — Telehealth: Payer: Self-pay | Admitting: Internal Medicine

## 2017-12-12 ENCOUNTER — Other Ambulatory Visit: Payer: Self-pay | Admitting: *Deleted

## 2017-12-12 MED ORDER — CARVEDILOL 3.125 MG PO TABS: 6.2500 mg | ORAL_TABLET | Freq: Two times a day (BID) | ORAL | 3 refills | Status: DC

## 2017-12-12 NOTE — Telephone Encounter (Signed)
Request for info to [facility] provider has been faxed to [place]. Nothing further needed.

## 2017-12-12 NOTE — Telephone Encounter (Signed)
Patient wife came by office  Dropped off envelope with information that will need to be faxed Placed in nurse box

## 2017-12-12 NOTE — Patient Outreach (Signed)
Andre [name] Andre [name] [facility]) Care Management [place] Telephone Outreach Unsuccessful Outreach attempt # 3 Successful subsequent outreach  12/12/2017  Andre [name] May 22, 1935 [id number]  Initially unsuccessful telephone outreach attempt x 2 to Andre [name], 82 y/o male referred to [place] by insurance provider; patient has history including, but not limited to, pAF; Parkinson's Disease; CAD with previous CABG; HTN/ HLD; d CHF; OSA; GERD; DDD; and anxiety/ depression. Noted through review of EMR that patient had recent hospitalization June 3-6, 2019 for shortness of breath/ dyspnea secondary to pleural effusion, requiring thoracentesis.  11:15 am: left HIPAA compliant voice message on patient's preferred listed number (mobile) requesting call back 11:20 am: left HIPAA compliant voice message on patient's home number requesting call back  2:10 pm:  Received voice mail message from patient; returned his call and successfully connected with patient; HIPAA/ identity verified during phone call today.  Discussed [facility] CM services with patient and his spouse Andre [name], while phone on speaker mode.  Patient provides verbal permission today for me to speak to his spouse "at any time" regarding his health care, and states that his wife handles "most" of all of his health care matters.  Reports wife is a independent agent for [employer], and that she is "very familiar" with [employer] insurance plan benefits.  Patient provides verbal consent for [place] RN CM involvement in his care today; pleasant 75 minute phone call.  Today, patient reports that he is doing "pretty good;" reports ongoing chronic neck pain, which he is "used to," and states he will attend upcoming MRI to evaluate neck pain this Thursday 12/14/17.  Reports his pain varies from day- to- day, but adds that his medications, along with heat "help the pain," although it "never completely goes away."  Patient sounds to  be in no apparent distress throughout phone call today, however, at times while talking, he is noticeably short of breath, which he reports as his baseline.  Patient/ spouse further report:  Medications: -- Has all medicationsand takes as prescribed;denies concerns about current medications; reports one question as to why he is on "2 blood thinners-- Eliquis and Plavix;" explained the basic difference between these two medications to patient, and that both act in different ways to thin blood, and for different reasons why.  Patient verbalized understanding after our conversation; however, I encouraged patient to specifically discuss any questions around his blood thinners with his cardiology provider at scheduled office visit next week, which patient and spouse both agree to do.  -- Verbalizes good general understanding of the purpose, dosing, and scheduling of medications.   -- wife manage medications using her "own system;" states that she puts patient's medications out every morning directly out of prescription bottle, and patient then independently takes without supervision -- patient denies issues with swallowing medications -- discussed with patient and spouse that at time of scheduled [facility] RN CCM home visit, medication review would be completed, as patient and spouse decline today, stating that they have a good understanding of current medications and that patient is "on too many to go over by phone."  Provider appointments: -- All recent and upcoming provider appointments were reviewed with patient/ spouse today; wife provides all transportation for patient to provider appointments and errands; patient/ spouse verbalize accurate reporting of upcoming provider appointments -- verbalize Dr. [name] as PCP; patient stated that he was "never real happy" with his pulmonary provider, and shared that he has scheduled a second opinion at [facility] with  another pulmonology provider- scheduled for January 22, 2018  Safety/ Mobility/ Falls: -- denies new/ recent falls; states has not fallen "in over a year;" however, he admits that at times his "balance is off" due to "mile Parkinson's Disease" -- assistive devices: none needed, per patient report today -- general fall risks/ prevention education discussed with patient/ spouse today  Social/ Community Resource needs: -- currently denies community resource needs -- wife provides transportation for patient to all provider appointments, errands, Counselling psychologist (AD) Planning:   -- reports currently has exisisting AD in place for HCPOA and living will; declines desire to make changes -- endorses that he is a "full code"  Self-health management of chronic disease state of CHF: -- patient and spouse verbalize a good general understanding of pathophysiology around chronic state of CHF; this was reiterated and reviewed using teach back method: patient states that his breathing "has been bad for about one year," when he had MI in July 2018; states that his doctors told him that the stents "would help" his breathing; however, patient reports, "it hasn't done anything but get worse." this is why patient has requested a second opinion for pulmonary provider care at [facility] -- patient currently monitors and records daily weights- positive reinforcement along with reiteration of rationale for daily weights discussed; weight gain guidelines in setting of CHF reviewed with patient and spouse- to report weight gain > 2 lbs overnight/ 5 lbs in one week to cardiologist -- patient reports general weight range over last month "between 241-244 lbs" with weight this morning of "244 lbs" -- concept of CHF zones initiated with patient: patient/ spouse are able to verbalize signs/ symptoms of yellow zone, although they are unfamiliar with zone concept-- explained that we would continue discussing and reviewed action plan for yellow CHF zone with patient and spouse  today. -- patient uses CPAP/ O2 at 2 L/min via oxygenator q HS; questions if he needs new CPAP, or "an adjustment," and I encouraged him to take his CPAP unit with him to upcoming pulmonary provider appointment, along with his medications, and to develop/ take a list of questions/ items he would like to discuss with new pulmonary provider-- patient/ spouse agree to do so.  Reports has been on CPAP 5-10 years, with current CPAP unit that is "28-56 years old" -- endorses following low sodium diet "for over a year now;" reports eats "no added salt,' and limits foods to all low sodium foods -- endorses sleeps only on one pillow; adherent to use of CPAP/ O2 -- verbalizes good general understanding of role of diuretics; endorses adherence to prescribed diuretic and verbalizes accurate report of dosing instructions -- reports uses nebulizer daily and rescue inhaler "a couple times every week."  Patient/ spouse deny further issues, concerns, or problems today.  I provided/ confirmed patient/ spouse with my direct phone number, the main [facility] CM office phone number, and the [facility] CM 24-hour nurse advice phone number should issues arise prior to next scheduled [place] outreach by phone next week after scheduled cardiology appointment.  Encouraged patient/ spouse to contact me directly if needs, questions, issues, or concerns arise prior to next scheduled outreach; they agreed to do so.  Plan:  Patient will take medications as prescribed and will attend all scheduled provider appointments  Patient will promptly notify care providers for any new concerns/ issues/ problems that arise  Patient will continue monitoring/ recording daily weights and will report weight gain > 2 lbs  overnight/ 5 lbs in one week to cardiology provider  Patient will attend upcoming new pulmonary referral appointment and will take his CPAP, his medications, and list of questions to cover with provider  I will make patien't PCP aware  of [place] RN CM involvement in patient's care-- will send barriers letter  [place] outreach to continue with scheduled phone call next week  [facility] CM Care Plan Problem One     Most Recent Value  Care Plan Problem One  Self-health management deficit of chronic disease state of CHF, as evidenced by patient and spouse reporting  Role Documenting the Problem One  Care Management Coordinator  Care Plan for Problem One  Active  [facility] Long Term Goal   Over the next 60 days, patient and spouse will be able to verbalize plan of care for ongoing management of chronic disease state of CHF, as evidenced by patient and spouse reporting during [facility] RN CCM outreach  [facility] Long Term Goal Start Date  12/12/17  Interventions for Problem One Long Term Goal  Discussed with patient and his spouse their current understanding of patient's overall plan of care,  current daily routines around self-health management of CHF,  discussed newly made referral to pulmonology provider for second opinion,  coached patient and spouse on items to discuss during new pulmonary provider office visit  [facility] CM Short Term Goal #1   Over the next 30 days, patient and spouse will meet with [facility] RN CCM for initial home visit, as evidenced by successful completion of [facility] RN CCM initial home visit  [facility] CM Short Term Goal #1 Start Date  12/12/17  Interventions for Short Term Goal #1  Discussed [facility] CCM services/ role of [facility] RN CCM,  scheduled initial home visit around patient's spouse work Museum/gallery exhibitions officer provider appointments  [facility] CM Short Term Goal #2   Over the next 30 days, patient will continue monitoring and recording daily weights at home, as evidenced by patient/ spouse reporting during [place] outreach  [facility] CM Short Term Goal #2 Start Date  12/12/17  Interventions for Short Term Goal #2  Discussed basic pathophysiology with patient and spouse and provided positive reinforcement that patient is already familiar with rationale  for completing daily weights in setting of CHF,  using teach back method, introduced/ reviewed weight gain guidelines/ action plan for weight gain in setting of CHF     I appreciate the opportunity to participate in [place]'s care,  Oneta Rack, RN, BSN, [employer] Coordinator [facility] Care Management  [phone]

## 2017-12-13 ENCOUNTER — Ambulatory Visit: Payer: Self-pay

## 2017-12-13 NOTE — Telephone Encounter (Signed)
Patient wife calling  Dropped off form yesterday to be sent to [facility] but patient had also attempted to fax information to our office  Would like to know if the fax came through  Please call to discuss

## 2017-12-13 NOTE — Telephone Encounter (Signed)
Called spouse and made aware several fax were received and this has been sent to [facility] for processing.

## 2017-12-14 ENCOUNTER — Ambulatory Visit
Admission: RE | Admit: 2017-12-14 | Discharge: 2017-12-14 | Disposition: A | Discharge status: Home / Self Care | Payer: PPO | Source: Ambulatory Visit | Attending: Physician Assistant | Admitting: Physician Assistant

## 2017-12-14 DIAGNOSIS — M542 Cervicalgia: Secondary | ICD-10-CM | POA: Diagnosis not present

## 2017-12-14 DIAGNOSIS — M4802 Spinal stenosis, cervical region: Secondary | ICD-10-CM

## 2017-12-14 DIAGNOSIS — M5030 Other cervical disc degeneration, unspecified cervical region: Secondary | ICD-10-CM | POA: Insufficient documentation

## 2017-12-17 ENCOUNTER — Other Ambulatory Visit: Payer: Self-pay

## 2017-12-17 ENCOUNTER — Encounter: Payer: Self-pay | Admitting: Emergency Medicine

## 2017-12-17 ENCOUNTER — Ambulatory Visit
Admission: EM | Admit: 2017-12-17 | Discharge: 2017-12-17 | Disposition: A | Discharge status: Home / Self Care | Payer: PPO | Attending: Family Medicine | Admitting: Family Medicine

## 2017-12-17 DIAGNOSIS — R310 Gross hematuria: Secondary | ICD-10-CM

## 2017-12-17 DIAGNOSIS — R82998 Other abnormal findings in urine: Secondary | ICD-10-CM

## 2017-12-17 LAB — URINALYSIS, COMPLETE (UACMP) WITH MICROSCOPIC
Bacteria, UA: NONE SEEN
Glucose, UA: NEGATIVE mg/dL
Ketones, ur: 15 mg/dL — AB
Leukocytes, UA: NEGATIVE
Nitrite: NEGATIVE
Protein, ur: >300 mg/dL — AB
RBC / HPF: >50 RBC/hpf (ref 0–5)
Specific Gravity, Urine: 1.015 (ref 1.005–1.030)
Squamous Epithelial / LPF: NONE SEEN (ref 0–5)
WBC, UA: NONE SEEN WBC/hpf (ref 0–5)
pH: 5 (ref 5.0–8.0)

## 2017-12-17 NOTE — ED Provider Notes (Signed)
[facility]    CSN: [id number] Arrival date & time: 12/17/17  1254     History   Chief Complaint Chief Complaint  Patient presents with  . Hematuria    HPI Andre [name] is a 82 y.o. male.   82 yo male with a c/o bloody urine for the past 3-4 days. Denies any pain, fevers, chills, vomiting. States he takes Eliquis and Plavix for his heart and also states he's on a fluid restriction for his heart as well. Denies any other bleeding.   The history is provided by the patient.  Hematuria     Past Medical History:  Diagnosis Date  . Cervical spondylosis 10/01/2013  . Chronic diastolic CHF (congestive heart failure) ([place])    a. 07/2016 Echo: >55%; b. 10/2016 Echo: EF 55-60%, Gr1 DD, Ao sclerosis w/o stenosis, sev dil LA; c. 08/2017 Echo: EF 60-65%, no rwma, Gr2 DD, mild AS, sev dil LA/RA.  [name] Coronary artery disease    a. 1998 s/p mini-cabg @ [facility] - LIMA->LAD;  b. 07/2016 St Echo: Inadequate HR w/ HTN response;  c.  08/2016 MV: EF 67%, no ischemia; d. 10/2016 NSTEMI/Cath: RCA 95p (4.0x26 Onyx DES), LIMA->LAD nl; e. 09/2017 Cath: LM 40/30, LAD 100ost, RI 80, LCX nl, OM2/3 nl, RCA patent stent, 77m->Med Rx.  . Depression   . GERD (gastroesophageal reflux disease)   . Hyperlipidemia   . Hypertension   . PAF (paroxysmal atrial fibrillation) (HCC)    a. s/p DCCV-->maintaining sinus on amiodarone;  b. CHA2DS2VASc = 5-->eliquis.  . Parkinson's disease ([place])    tremors  . Pleural effusion   . Pleural effusion, right    a. 09/2017 s/p thoracentesis.  . Pulmonary embolism ([place]) 2011  . 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 Sleep apnea    wears CPAP    Patient Active Problem List   Diagnosis Date Noted  . Muscle cramps 11/02/2017  . Renal cyst 10/02/2017  . Pleural effusion 09/18/2017  . PNA (pneumonia) 08/26/2017  . Diarrhea 08/21/2017  . Anemia 08/21/2017  . Acute respiratory failure ([place]) 08/11/2017  . Renal lesion 07/08/2017  . Fall 05/29/2017  . Lightheadedness  05/29/2017  . Abrasion 02/08/2017  . Anxiety and depression 01/05/2017  . Prediabetes 10/27/2016  . Hypothyroidism 10/27/2016  . (HFpEF) heart failure with preserved ejection fraction ([place]) 09/27/2016  . Parkinson's disease ([place]) 06/30/2016  . Thrombocytopenia ([place]) 01/29/2016  . PAF (paroxysmal atrial fibrillation) ([place])   . BMI 40.0-44.9, adult ([place]) 05/06/2015  . Erythrocytosis 01/28/2015  . Atherosclerosis of abdominal aorta ([place]) 12/08/2014  . Barrett's esophagus 12/31/2013  . DDD (degenerative disc disease), cervical 10/01/2013  . DDD (degenerative disc disease), lumbar 10/01/2013  . Coronary artery disease involving native coronary artery of native heart with angina pectoris ([place]) 10/30/2012  . GERD (gastroesophageal reflux disease) 10/30/2012  . Hyperlipidemia with target LDL less than 70 10/30/2012  . Essential hypertension 10/30/2012  . Dyspnea 09/10/2012  . Allergic rhinitis 09/10/2012  . OSA (obstructive sleep apnea) 09/10/2012    Past Surgical History:  Procedure Laterality Date  . BACK SURGERY  1960  . CARDIAC CATHETERIZATION    . CHOLECYSTECTOMY  2010  . CORONARY ARTERY BYPASS GRAFT  01/07/1997  . CORONARY STENT INTERVENTION N/A 10/31/2016   Procedure: Coronary Stent Intervention;  Surgeon: Wellington Hampshire, MD;  Location: [place] [facility];  Service: Cardiovascular;  Laterality: N/A;  . ELECTROPHYSIOLOGIC STUDY N/A 07/14/2015   Procedure: CARDIOVERSION;  Surgeon: Yolonda Kida, MD;  Location: [facility]  ORS;  Service: Cardiovascular;  Laterality: N/A;  . ELECTROPHYSIOLOGIC STUDY N/A 10/12/2015   Procedure: CARDIOVERSION;  Surgeon: Minna Merritts, MD;  Location: [facility] ORS;  Service: Cardiovascular;  Laterality: N/A;  . LEFT HEART CATH AND CORONARY ANGIOGRAPHY N/A 10/31/2016   Procedure: Left Heart Cath and Coronary Angiography;  Surgeon: Wellington Hampshire, MD;  Location: [place] [facility];  Service: Cardiovascular;  Laterality: N/A;  . OTHER SURGICAL HISTORY   1998   Bypass  . RIGHT/LEFT HEART CATH AND CORONARY ANGIOGRAPHY N/A 09/18/2017   Procedure: RIGHT/LEFT HEART CATH AND CORONARY ANGIOGRAPHY;  Surgeon: Wellington Hampshire, MD;  Location: [place] [facility];  Service: Cardiovascular;  Laterality: N/A;       Home Medications    Prior to Admission medications   Medication Sig Start Date End Date Taking? Authorizing Provider  albuterol (PROVENTIL) (2.5 MG/3ML) 0.083% nebulizer solution Take 3 mLs (2.5 mg total) by nebulization every 4 (four) hours as needed for wheezing or shortness of breath. Patient taking differently: Take 2.5 mg by nebulization as needed for wheezing or shortness of breath.  08/24/17  Yes Kasa, Maretta Bees, MD  carbidopa-levodopa (SINEMET) 25-250 MG tablet Take 1.5 tablets by mouth 3 (three) times daily. 11/13/17  Yes Kathrynn Ducking, MD  carvedilol (COREG) 3.125 MG tablet Take 2 tablets (6.25 mg total) by mouth 2 (two) times daily with a meal. 12/12/17 02/10/18 Yes Wellington Hampshire, MD  clopidogrel (PLAVIX) 75 MG tablet TAKE ONE TABLET EVERY DAY WITH BREAKFAST 11/21/17  Yes Wellington Hampshire, MD  cyclobenzaprine (FLEXERIL) 5 MG tablet Take 5 mg by mouth daily as needed for muscle spasms.    Yes [provider]  ELIQUIS 5 MG TABS tablet TAKE ONE TABLET TWICE DAILY 02/06/17  Yes Wellington Hampshire, MD  escitalopram (LEXAPRO) 10 MG tablet Take 1 tablet (10 mg total) by mouth daily. 09/12/17  Yes [name], MD  esomeprazole (NEXIUM) 20 MG capsule Take 40 mg by mouth daily at 12 noon.   Yes [provider]  fluticasone (FLONASE) 50 MCG/ACT nasal spray TAKE 2 PUFFS IN EACH NOSTRIL EVERY DAY Patient taking differently: TAKE 2 PUFFS IN EACH NOSTRIL EVERY DAY, takes as prn 10/18/16  Yes Cook, Jayce G, DO  furosemide (LASIX) 20 MG tablet TAKE TWO TABLETS TWICE DAILY 11/22/17  Yes Wellington Hampshire, MD  isosorbide mononitrate (IMDUR) 30 MG 24 hr tablet Take 1 tablet (30 mg total) by mouth daily. 09/21/17 12/17/17 Yes Sainani,  Belia Heman, MD  levothyroxine (SYNTHROID, LEVOTHROID) 50 MCG tablet TAKE 1 TABLET EVERY DAY ON EMPTY STOMACHWITH A GLASS OF WATER AT LEAST 30-60 MINBEFORE BREAKFAST 10/27/17  Yes [name], MD  mirabegron ER (MYRBETRIQ) 50 MG TB24 tablet Take 50 mg by mouth daily.   Yes [provider]  modafinil (PROVIGIL) 200 MG tablet Take 1 tablet (200 mg total) by mouth daily. 11/13/17  Yes Kathrynn Ducking, MD  Polyethyl Glycol-Propyl Glycol (SYSTANE OP) Place 1 drop into both eyes 2 (two) times daily as needed (dry eyes).   Yes [provider]  potassium chloride SA (K-DUR,KLOR-CON) 20 MEQ tablet Take 1 tablet (20 mEq total) by mouth daily. 09/06/17  Yes Wellington Hampshire, MD  rosuvastatin (CRESTOR) 10 MG tablet Take 10 mg by mouth at bedtime.   Yes [provider]  VENTOLIN HFA 108 (90 Base) MCG/ACT inhaler TAKE 2 PUFFS EVERY 8 HOURS AS NEEDED FORWHEEZING 12/02/16  Yes Cook, Jayce G, DO  entacapone (COMTAN) 200  MG tablet Take 200 mg by mouth 3 (three) times daily. 10/25/17   [provider]  EPINEPHrine 0.3 mg/0.3 mL IJ SOAJ injection Inject 0.3 mg as directed once as needed (allergic reaction). Reported on 10/27/2015 07/01/13   [provider]  losartan (COZAAR) 25 MG tablet Take 1 tablet (25 mg total) by mouth daily. 09/21/17 11/27/17  Henreitta Leber, MD    Family History Family History  Problem Relation Age of Onset  . Alcohol abuse Father     Social History Social History   Tobacco Use  . Smoking status: Former Smoker    Packs/day: 1.00    Years: 40.00    Pack years: 40.00    Types: Cigarettes, Pipe, Cigars    Last attempt to quit: 04/18/1972    Years since quitting: 45.6  . Smokeless tobacco: Former Systems developer    Types: Chew    Quit date: 04/18/1972  Substance Use Topics  . Alcohol use: Yes    Alcohol/week: 4.0 standard drinks    Types: 2 Cans of beer, 2 Shots of liquor per week    Comment: per 2 weeks   . Drug use: No     Allergies     Pravastatin and Prednisone   Review of Systems Review of Systems  Genitourinary: Positive for hematuria.     Physical Exam Triage Vital Signs ED Triage Vitals  Enc Vitals Group     BP 12/17/17 1311 (!) 117/92     Pulse Rate 12/17/17 1311 60     Resp 12/17/17 1311 20     Temp 12/17/17 1311 97.7 F (36.5 C)     Temp Source 12/17/17 1311 Oral     SpO2 12/17/17 1311 96 %     Weight 12/17/17 1312 241 lb (109.3 kg)     Height 12/17/17 1312 5\' 7"  (1.702 m)     Head Circumference --      Peak Flow --      Pain Score 12/17/17 1312 0     Pain Loc --      Pain Edu? --      Excl. in [place]? --    No data found.  Updated Vital Signs BP (!) 117/92 (BP Location: Left Arm)   Pulse 60   Temp 97.7 F (36.5 C) (Oral)   Resp 20   Ht 5\' 7"  (1.702 m)   Wt 109.3 kg   SpO2 96%   BMI 37.75 kg/m   Visual Acuity Right Eye Distance:   Left Eye Distance:   Bilateral Distance:    Right Eye Near:   Left Eye Near:    Bilateral Near:     Physical Exam  Constitutional: He is oriented to person, place, and time. He appears well-developed and well-nourished. No distress.  HENT:  Head: Normocephalic and atraumatic.  Cardiovascular: Normal rate.  Pulmonary/Chest: Effort normal. No respiratory distress.  Abdominal: Soft. Bowel sounds are normal. He exhibits no distension and no mass. There is no tenderness. There is no rebound and no guarding.  Neurological: He is alert and oriented to person, place, and time.  Skin: No rash noted. He is not diaphoretic.  Nursing note and vitals reviewed.    UC Treatments / Results  Labs (all labs ordered are listed, but only abnormal results are displayed) Labs Reviewed  URINALYSIS, COMPLETE (UACMP) WITH MICROSCOPIC - Abnormal; Notable for the following components:      Result Value   Color, Urine BROWN (*)    APPearance CLOUDY (*)  Hgb urine dipstick LARGE (*)    Bilirubin Urine MODERATE (*)    Ketones, ur 15 (*)    Protein, ur >300 (*)    All  other components within normal limits    EKG None  Radiology No results found.  Procedures Procedures (including critical care time)  Medications Ordered in UC Medications - No data to display  Initial Impression / Assessment and Plan / UC Course  I have reviewed the triage vital signs and the nursing notes.  Pertinent labs & imaging results that were available during my care of the patient were reviewed by me and considered in my medical decision making (see chart for details).      Final Clinical Impressions(s) / UC Diagnoses   Final diagnoses:  Gross hematuria  Crystalluria     Discharge Instructions     Increase water intake slightly    ED Prescriptions    None      1. Lab results and diagnosis reviewed with patient 2. Recommend supportive treatment with increase water intake slightly but continue monitoring due to heart 3. Follow up with urologist and cardiologist next week 4. Follow-up prn if symptoms worsen or don't improve    Controlled Substance Prescriptions  Controlled Substance Registry consulted? Not Applicable   Norval Gable, MD 12/17/17 1416

## 2017-12-17 NOTE — Discharge Instructions (Signed)
Increase water intake slightly

## 2017-12-17 NOTE — ED Triage Notes (Signed)
Patient c/o hematuria that started 4 days ago. Denies painful urination.

## 2017-12-19 ENCOUNTER — Ambulatory Visit: Payer: PPO | Admitting: Cardiovascular Disease

## 2017-12-21 ENCOUNTER — Encounter: Payer: Self-pay | Admitting: Nurse Practitioner

## 2017-12-21 ENCOUNTER — Ambulatory Visit: Payer: PPO | Admitting: Nurse Practitioner

## 2017-12-21 VITALS — BP 142/62 | HR 61 | Ht 67.0 in | Wt 246.5 lb

## 2017-12-21 DIAGNOSIS — E039 Hypothyroidism, unspecified: Secondary | ICD-10-CM | POA: Diagnosis not present

## 2017-12-21 DIAGNOSIS — I5032 Chronic diastolic (congestive) heart failure: Secondary | ICD-10-CM | POA: Diagnosis not present

## 2017-12-21 DIAGNOSIS — I480 Paroxysmal atrial fibrillation: Secondary | ICD-10-CM | POA: Diagnosis not present

## 2017-12-21 DIAGNOSIS — I2510 Atherosclerotic heart disease of native coronary artery without angina pectoris: Secondary | ICD-10-CM

## 2017-12-21 DIAGNOSIS — E785 Hyperlipidemia, unspecified: Secondary | ICD-10-CM | POA: Diagnosis not present

## 2017-12-21 DIAGNOSIS — I10 Essential (primary) hypertension: Secondary | ICD-10-CM | POA: Diagnosis not present

## 2017-12-21 NOTE — Progress Notes (Signed)
Office Visit    Patient Name: Andre [name] Date of Encounter: 12/21/2017  Primary Care Provider:  [name], MD Primary Cardiologist:  [name], MD  Chief Complaint    82 y/o ? with who presents for f/u related to his extensive hx including CAD s/p one vessel CABG in 1998, HFpEF, PAF on eliquis, PE, pulm fibrosis, Parkinson's dzs, COPD 2/2 prior tob abuse, HTN, HL, morbid obesity, sleep apnea, and R pleural s/p thoracentesis in 09/2017.  Past Medical History    Past Medical History:  Diagnosis Date  . Cervical spondylosis 10/01/2013  . Chronic diastolic CHF (congestive heart failure) ([place])    a. 07/2016 Echo: >55%; b. 10/2016 Echo: EF 55-60%, Gr1 DD, Ao sclerosis w/o stenosis, sev dil LA; c. 08/2017 Echo: EF 60-65%, no rwma, Gr2 DD, mild AS, sev dil LA/RA.  [name] Coronary artery disease    a. 1998 s/p mini-cabg @ [facility] - LIMA->LAD;  b. 07/2016 St Echo: Inadequate HR w/ HTN response;  c.  08/2016 MV: EF 67%, no ischemia; d. 10/2016 NSTEMI/Cath: RCA 95p (4.0x26 Onyx DES), LIMA->LAD nl; e. 09/2017 Cath: LM 40/30, LAD 100ost, RI 80, LCX nl, OM2/3 nl, RCA patent stent, 38m, LIMA->LAD nl-->Med Rx.  . Depression   . GERD (gastroesophageal reflux disease)   . Hyperlipidemia   . Hypertension   . PAF (paroxysmal atrial fibrillation) ([facility])    a. s/p DCCV-->maintaining sinus on amiodarone;  b. CHA2DS2VASc = 5-->eliquis.  . Parkinson's disease ([place])    tremors  . Pleural effusion, right    a. 09/2017 s/p thoracentesis.  . Pulmonary embolism ([place]) 2011  . Pulmonary fibrosis ([place])   . 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 Sleep apnea    wears CPAP   Past Surgical History:  Procedure Laterality Date  . BACK SURGERY  1960  . CARDIAC CATHETERIZATION    . CHOLECYSTECTOMY  2010  . CORONARY ARTERY BYPASS GRAFT  01/07/1997  . CORONARY STENT INTERVENTION N/A 10/31/2016   Procedure: Coronary Stent Intervention;  Surgeon: Wellington Hampshire, MD;  Location: [place] [facility];  Service:  Cardiovascular;  Laterality: N/A;  . ELECTROPHYSIOLOGIC STUDY N/A 07/14/2015   Procedure: CARDIOVERSION;  Surgeon: Yolonda Kida, MD;  Location: [facility];  Service: Cardiovascular;  Laterality: N/A;  . ELECTROPHYSIOLOGIC STUDY N/A 10/12/2015   Procedure: CARDIOVERSION;  Surgeon: Minna Merritts, MD;  Location: [facility];  Service: Cardiovascular;  Laterality: N/A;  . LEFT HEART CATH AND CORONARY ANGIOGRAPHY N/A 10/31/2016   Procedure: Left Heart Cath and Coronary Angiography;  Surgeon: Wellington Hampshire, MD;  Location: [place] [facility];  Service: Cardiovascular;  Laterality: N/A;  . OTHER SURGICAL HISTORY  1998   Bypass  . RIGHT/LEFT HEART CATH AND CORONARY ANGIOGRAPHY N/A 09/18/2017   Procedure: RIGHT/LEFT HEART CATH AND CORONARY ANGIOGRAPHY;  Surgeon: Wellington Hampshire, MD;  Location: [place] [facility];  Service: Cardiovascular;  Laterality: N/A;    Allergies  Allergies  Allergen Reactions  . Pravastatin Other (See Comments)  . Prednisone Other (See Comments)    Pt states that med makes him hyper Pt states that med makes him hyper    History of Present Illness    82 y/o ? with the above complex PMH including CAD s/p 1 vessel bypass in 1998, HFpEF, PAF on eliquis, prior PE, pulm fibrosis, Parkinson's dzs, COPD, prior tob abuse, HTN, HL, morbid obesity, and sleep apnea.  In 10/2016, he suffered a NSTEMI and underwent cath revealing severe RCA dzs,  which required DES. He also had significant Ramus dzs and a patent LIMA  LAD.  The ramus was medically managed.  He has been dealing with chronic dyspnea ever since.  Earlier this year, CT of the chest showed early pulmonary fibrosis and COPD.  Amiodarone was discontinued at that time.  In April 2019, he was admitted with respiratory failure hypoxia and diagnosed with COPD and pneumonia.  He never fully recovered and was readmitted in May for heart failure pneumonia.  A follow-up PA and lateral chest x-ray showed increasing though still small  right pleural effusion.  Unfortunately, symptoms worsen despite escalation of Lasix and he underwent right and left heart cardiac catheterization in June 2019, revealing stable coronary anatomy with mildly elevated filling pressures.  He was felt to be safe for discharge but then developed worsening dyspnea afterward and presented to the emergency department.  Chest x-ray showed increased right pleural effusion.  He was admitted and seen by pulmonology and subsequently underwent thoracentesis with improvement in symptoms.  He was last seen in clinic in June at which time, he continued to complain of dyspnea on exertion though overall it was stable.  Weight was stable at home at 241 pounds.  He was not having any chest pain.  Since his last visit, he has been relatively stable.  He is still bothered by dyspnea on exertion but says his biggest problem is "getting off his butt."  He has not been having any chest pain and further denies PND, orthopnea, dizziness, syncope, edema, or early satiety.  He and his wife are very careful with his salt intake and he is weighing daily.  His weight is up slightly over the summer and he is disappointed by this.  Of note, he was seen at an urgent care on September 1 secondary to hematuria.  UA was negative for infection.  He was advised to increase his fluid intake.  As he has done so, he says his urine has cleared and now looks like lemonade.  He does plan to follow-up with urology.  Home Medications    Prior to Admission medications   Medication Sig Start Date End Date Taking? Authorizing Provider  albuterol (PROVENTIL) (2.5 MG/3ML) 0.083% nebulizer solution Take 3 mLs (2.5 mg total) by nebulization every 4 (four) hours as needed for wheezing or shortness of breath. Patient taking differently: Take 2.5 mg by nebulization as needed for wheezing or shortness of breath.  08/24/17   Flora Lipps, MD  carbidopa-levodopa (SINEMET) 25-250 MG tablet Take 1.5 tablets by mouth 3  (three) times daily. 11/13/17   Kathrynn Ducking, MD  carvedilol (COREG) 3.125 MG tablet Take 2 tablets (6.25 mg total) by mouth 2 (two) times daily with a meal. 12/12/17 02/10/18  Wellington Hampshire, MD  clopidogrel (PLAVIX) 75 MG tablet TAKE ONE TABLET EVERY DAY WITH BREAKFAST 11/21/17   Wellington Hampshire, MD  cyclobenzaprine (FLEXERIL) 5 MG tablet Take 5 mg by mouth daily as needed for muscle spasms.     [provider]  ELIQUIS 5 MG TABS tablet TAKE ONE TABLET TWICE DAILY 02/06/17   Wellington Hampshire, MD  entacapone (COMTAN) 200 MG tablet Take 200 mg by mouth 3 (three) times daily. 10/25/17   [provider]  EPINEPHrine 0.3 mg/0.3 mL IJ SOAJ injection Inject 0.3 mg as directed once as needed (allergic reaction). Reported on 10/27/2015 07/01/13   [provider]  escitalopram (LEXAPRO) 10 MG tablet Take 1 tablet (10 mg total) by mouth  daily. 09/12/17   [name], MD  esomeprazole (NEXIUM) 20 MG capsule Take 40 mg by mouth daily at 12 noon.    [provider]  fluticasone (FLONASE) 50 MCG/ACT nasal spray TAKE 2 PUFFS IN EACH NOSTRIL EVERY DAY Patient taking differently: TAKE 2 PUFFS IN EACH NOSTRIL EVERY DAY, takes as prn 10/18/16   Cook, Macdoel G, DO  furosemide (LASIX) 20 MG tablet TAKE TWO TABLETS TWICE DAILY 11/22/17   Wellington Hampshire, MD  isosorbide mononitrate (IMDUR) 30 MG 24 hr tablet Take 1 tablet (30 mg total) by mouth daily. 09/21/17 12/17/17  Henreitta Leber, MD  levothyroxine (SYNTHROID, LEVOTHROID) 50 MCG tablet TAKE 1 TABLET EVERY DAY ON EMPTY STOMACHWITH A GLASS OF WATER AT LEAST 30-60 MINBEFORE BREAKFAST 10/27/17   [name], MD  losartan (COZAAR) 25 MG tablet Take 1 tablet (25 mg total) by mouth daily. 09/21/17 11/27/17  Henreitta Leber, MD  mirabegron ER (MYRBETRIQ) 50 MG TB24 tablet Take 50 mg by mouth daily.    [provider]  modafinil (PROVIGIL) 200 MG tablet Take 1 tablet (200 mg total) by mouth daily. 11/13/17   Kathrynn Ducking, MD  Polyethyl Glycol-Propyl Glycol (SYSTANE OP) Place 1 drop into both eyes 2 (two) times daily as needed (dry eyes).    [provider]  potassium chloride SA (K-DUR,KLOR-CON) 20 MEQ tablet Take 1 tablet (20 mEq total) by mouth daily. 09/06/17   Wellington Hampshire, MD  rosuvastatin (CRESTOR) 10 MG tablet Take 10 mg by mouth at bedtime.    [provider]  VENTOLIN HFA 108 (90 Base) MCG/ACT inhaler TAKE 2 PUFFS EVERY 8 HOURS AS NEEDED FORWHEEZING 12/02/16   Coral Spikes, DO    Review of Systems    Stable, chronic dyspnea on exertion.  He denies chest pain, palpitations, pnd, orthopnea, n, v, dizziness, syncope, edema, weight gain, or early satiety.  All other systems reviewed and are otherwise negative except as noted above.  Physical Exam    VS:  BP (!) 142/62 (BP Location: Left Arm, Patient Position: Sitting, Cuff Size: Large)   Pulse 61   Ht 5\' 7"  (1.702 m)   Wt 246 lb 8 oz (111.8 kg)   BMI 38.61 kg/m  , BMI Body mass index is 38.61 kg/m. GEN: obese, in no acute distress. HEENT: normal. Neck: Supple, obese, difficult to gauge JVP, no carotid bruits, or masses. Cardiac: RRR, no murmurs, rubs, or gallops. No clubbing, cyanosis, edema.  Radials/DP/PT 2+ and equal bilaterally.  Respiratory:  Respirations regular and unlabored, minimally diminished in the right base otherwise clear to auscultation bilaterally. GI: Obese, soft, nontender, nondistended, BS + x 4. MS: no deformity or atrophy. Skin: warm and dry, no rash. Neuro:  Strength and sensation are intact. Psych: Normal affect.  Accessory Clinical Findings    ECG personally reviewed by me today -regular sinus rhythm, 61, left axis deviation, nonspecific T changes- no acute changes.  Assessment & Plan    1.  HFpEF: Weight is up slightly but he remains euvolemic on exam.  He has chronic, stable dyspnea on exertion which at this point, I think is largely related to deconditioning and pulmonary fibrosis given  stable volume and good air movement status post thoracentesis in June for pleural effusion.  He remains on beta-blocker, Lasix, nitrate, and ARB therapy.  2.  Coronary artery disease: Stable without chest pain.  Catheterization in June showed stable anatomy with moderate obstructive ramus intermedius disease.  He remains on beta-blocker, Plavix, nitrate, and ARB therapy.  He is greater than 1 year since his PCI and as he is also on Eliquis, we can look to discontinue Plavix when he runs out of this current bottle.  3.  Paroxysmal atrial fibrillation: Maintaining sinus rhythm.  Previously on amiodarone but discontinued secondary to pulmonary fibrosis.  Continue beta-blocker and Eliquis.  4.  COPD/pulmonary fibrosis: He is seeking a second opinion at [facility]  5.  Hematuria: Recently noted on September 1 and appears to have resolved.  He is on Eliquis and Plavix as above.  He will discontinue Plavix once he runs out of this bottle.  He has follow-up with urology.  6.  Morbid obesity: I recommended today that he consider cardiopulmonary rehabilitation, especially in the setting of diagnosis of pulmonary fibrosis and generalized deconditioning.  He prefers to follow-up with pulmonology at [facility] first but will contact us if he wants to go forward with a referral.  7.  Hypothyroidism: Patient on levothyroxine but has not had a TSH since the spring 2018.  He has asked if we can check this today and we will draw.    8.  Disposition: Follow-up in 3 months or sooner if necessary.   Murray Hodgkins, NP 12/21/2017, 4:51 PM

## 2017-12-21 NOTE — Patient Instructions (Signed)
Medication Instructions: STOP the Plavix  If you need a refill on your cardiac medications before your next appointment, please call your pharmacy.   Labwork: Your provider would like for you to have the following labs today: TSH  Follow-Up: Your physician wants you to follow-up in 3 months with Dr. Fletcher Anon.   Thank you for choosing [facility] at [facility]!

## 2017-12-22 ENCOUNTER — Encounter: Payer: Self-pay | Admitting: *Deleted

## 2017-12-22 ENCOUNTER — Other Ambulatory Visit: Payer: Self-pay | Admitting: *Deleted

## 2017-12-22 LAB — TSH: TSH: 2.02 u[IU]/mL (ref 0.450–4.500)

## 2017-12-22 NOTE — Patient Outreach (Signed)
[name]) Care Management Andre [name] Telephone Outreach  12/22/2017  Andre [name] [birth year] [id number]  Successful telephone outreach to Andre [name], spouse of Andre [name], 82 y/o male referred to [facility] by insurance provider; patient has history including, but not limited to, pAF; Parkinson's Disease; CAD with previous CABG; HTN/ HLD; d CHF; OSA; GERD; DDD; and anxiety/ depression. Noted through review of EMR that patient had recent hospitalization June 3-6, 2019 for shortness of breath/ dyspnea secondary to pleural effusion, requiring thoracentesis.  Patient provided previous verbal permission for me to speak to his spouse "at any time" regarding his health care, stating that his wife handles "most" of all of his health care matters.  HIPAA/ identity verified with patient's wife today.  Today, patient's spouse reports that patient "is about the same as he always is;" denies that he is having pain outside of his "normal" and states that he "got a good report" from the cardiologist during scheduled office visit yesterday.  Spouse denies that patient is in distress, and denies that patient has experienced new/ recent falls; states patient is currently "taking a nap."  Reports that since his visit to the Urgent care center earlier this week for hematuria, "his urine has cleared up," and confirms that he is not having bloody urine.  Patient's spouse further reports:  -- no concerns around medications; states that patient has and is taking all medications as prescribed; accurately verbalizes post-cardiology office visit instructions to stop taking plavix once he runs out of this medication.  Reminded spouse that at time of scheduled [facility] RN CCM home visit in 10 days, medication review would be completed, and she verbalizes understanding and agreement  -- attended yesterday's cardiology appointment; wife continues providing transportation to appointments and attends  all office visits with patient; confirms that patient has scheduled appointment with urology provider "in about 2 weeks" to follow up on recent episode of hematuria; continues to await to hear about scheduling second opinion for pulmonology appointment at [facility]; states that she expects to hear from them soon, and she confirms that she has maintained contact with office staff to schedule promptly  Self-health management of chronic disease state of CHF: -- confirms that patient has continued monitoring/ recording daily weights- reviewed weights for this week; ranges have consistently been between 242-245 lbs, with a weight this morning of 243 lbs; reports that they took all recent weights to cardiology appointment yesterday and that provider 'was happy" with where patient has maintained weight.  Reiterated with spouse weight gain guidelines in setting of CHF reviewed with patient and spouse- to report weight gain > 2 lbs overnight/ 5 lbs in one week to cardiologist -- denies new/ concerning symptoms around shortness of breath, states, "he is where he always is" -- continues using CPAP as directed qHS; using nebulizer daily and rescue inhaler as needed -- wife reports that patient continues "needing so much rest," and verbalizes that patient was encouraged to attend outpatient cardiac rehabilitation classes once he has his neck pain evaluated and completes second opinion for pulmonology at [facility]; states that she "doubts" patient will agree to this, stating he "is just not very motivated to do anything."  Spouse deny further issues, concerns, or problems today. I provided/ confirmed patient/ spouse with my direct phone number, the main [facility] CM office phone number, and the [facility] CM 24-hour nurse advice phone number should issues arise prior to next scheduled [place] outreach with initial home visit in  10 days.  Encouraged patient/ spouse to contact me directly if needs, questions, issues, or concerns arise  prior to next scheduled outreach; they agreed to do so.  Plan:  Patient will take medications as prescribed and will attend all scheduled provider appointments  Patient will promptly notify care providers for any new concerns/ issues/ problems that arise  Patient will continue monitoring/ recording daily weights and will report weight gain > 2 lbs overnight/ 5 lbs in one week to cardiology provider  Patient will attend upcoming new pulmonary referral appointment and will take his CPAP, his medications, and list of questions to cover with provider  [place] CM outreach to continue with scheduled initial home visit in 10 days  [facility] CM Care Plan Problem One     Most Recent Value  Care Plan Problem One  Self-health management deficit of chronic disease state of CHF, as evidenced by patient and spouse reporting  Role Documenting the Problem One  Care Management Coordinator  Care Plan for Problem One  Active  [facility] Long Term Goal   Over the next 60 days, patient and spouse will be able to verbalize plan of care for ongoing management of chronic disease state of CHF, as evidenced by patient and spouse reporting during [facility] RN CCM outreach  [facility] Long Term Goal Start Date  12/12/17  Interventions for Problem One Long Term Goal  Reviewed yesterday's cardiology provider office visit instructions with patient's spouse and confirmed that spouse is able to accurately verbalize post-visit discharge instructions and overall plan of care  [facility] CM Short Term Goal #1   Over the next 30 days, patient and spouse will meet with [facility] RN CCM for initial home visit, as evidenced by successful completion of [facility] RN CCM initial home visit  [facility] CM Short Term Goal #1 Start Date  12/12/17  Interventions for Short Term Goal #1  Confirmed previously scheduled [facility] RN CM with patient's spouse  [facility] CM Short Term Goal #2   Over the next 30 days, patient will continue monitoring and recording daily weights at home,  as evidenced by patient/ spouse reporting during [place] outreach  [facility] CM Short Term Goal #2 Start Date  12/12/17  Interventions for Short Term Goal #2  Confirmed with patient's spouse that patient has continued to monitor and record daily weights and reviewed weight gain guidelines in setting of CHF with spouse,  reviewed all recent daily weights with patient's spouse     Oneta Rack, RN, BSN, [employer] Coordinator [facility] Care Management  [phone]

## 2017-12-22 NOTE — Addendum Note (Signed)
Addended by: Anselm Pancoast on: 12/22/2017 11:54 AM   Modules accepted: Orders

## 2017-12-25 DIAGNOSIS — J449 Chronic obstructive pulmonary disease, unspecified: Secondary | ICD-10-CM | POA: Diagnosis not present

## 2017-12-25 DIAGNOSIS — J441 Chronic obstructive pulmonary disease with (acute) exacerbation: Secondary | ICD-10-CM | POA: Diagnosis not present

## 2017-12-25 DIAGNOSIS — G4733 Obstructive sleep apnea (adult) (pediatric): Secondary | ICD-10-CM | POA: Diagnosis not present

## 2017-12-25 DIAGNOSIS — J45909 Unspecified asthma, uncomplicated: Secondary | ICD-10-CM | POA: Diagnosis not present

## 2017-12-26 ENCOUNTER — Other Ambulatory Visit: Payer: Self-pay | Admitting: Neurology

## 2017-12-26 MED ORDER — ENTACAPONE 200 MG PO TABS: 200.0000 mg | ORAL_TABLET | Freq: Three times a day (TID) | ORAL | 3 refills | Status: DC

## 2017-12-29 ENCOUNTER — Ambulatory Visit (INDEPENDENT_AMBULATORY_CARE_PROVIDER_SITE_OTHER): Payer: PPO

## 2017-12-29 VITALS — BP 140/64 | HR 63 | Temp 97.8°F | Resp 16 | Ht 66.75 in | Wt 248.8 lb

## 2017-12-29 DIAGNOSIS — Z0000 Encounter for general adult medical examination without abnormal findings: Secondary | ICD-10-CM | POA: Diagnosis not present

## 2017-12-29 DIAGNOSIS — Z23 Encounter for immunization: Secondary | ICD-10-CM

## 2017-12-29 NOTE — Progress Notes (Signed)
Subjective:   Andre [name] is a 82 y.o. male who presents for an Initial Medicare Annual Wellness Visit.  Review of Systems  No ROS.  Medicare Wellness Visit. Additional risk factors are reflected in the social history. Cardiac Risk Factors include: advanced age (>49men, >4 women);male gender;obesity (BMI >30kg/m2)    Objective:    Today's Vitals   12/29/17 1232  BP: 140/64  Pulse: 63  Resp: 16  Temp: 97.8 F (36.6 C)  TempSrc: Oral  SpO2: 97%  Weight: 248 lb 12.8 oz (112.9 kg)  Height: 5' 6.75" (1.695 m)   Body mass index is 39.26 kg/m.  Advanced Directives 12/29/2017 12/17/2017 12/12/2017 09/18/2017 09/18/2017 08/26/2017 08/11/2017  Does Patient Have a Medical Advance Directive? Yes No Yes Yes Yes Yes Yes  Type of Paramedic of [place];Living will [place];Living will [place];Living will [place];Living will [place];Living will [place];Living will Living will;Healthcare Power of Attorney  Does patient want to make changes to medical advance directive? No - Patient declined - No - Patient declined No - Patient declined No - Patient declined No - Patient declined No - Patient declined  Copy of [place] in Chart? No - copy requested - - No - copy requested No - copy requested No - copy requested No - copy requested  Would patient like information on creating a medical advance directive? - - - No - Patient declined No - Patient declined No - Patient declined No - Patient declined    Current Medications (verified) Outpatient Encounter Medications as of 12/29/2017  Medication Sig  . albuterol (PROVENTIL) (2.5 MG/3ML) 0.083% nebulizer solution Take 3 mLs (2.5 mg total) by nebulization every 4 (four) hours as needed for wheezing or shortness of breath. (Patient taking differently: Take 2.5 mg by nebulization as needed for wheezing or shortness  of breath. )  . carbidopa-levodopa (SINEMET) 25-250 MG tablet Take 1.5 tablets by mouth 3 (three) times daily.  . carvedilol (COREG) 3.125 MG tablet Take 2 tablets (6.25 mg total) by mouth 2 (two) times daily with a meal.  . cyclobenzaprine (FLEXERIL) 5 MG tablet Take 5 mg by mouth daily as needed for muscle spasms.   [name] ELIQUIS 5 MG TABS tablet TAKE ONE TABLET TWICE DAILY  . entacapone (COMTAN) 200 MG tablet Take 1 tablet (200 mg total) by mouth 3 (three) times daily.  [name] EPINEPHrine 0.3 mg/0.3 mL IJ SOAJ injection Inject 0.3 mg as directed once as needed (allergic reaction). Reported on 10/27/2015  . escitalopram (LEXAPRO) 10 MG tablet Take 1 tablet (10 mg total) by mouth daily.  [name] esomeprazole (NEXIUM) 20 MG capsule Take 40 mg by mouth daily at 12 noon.  . fluticasone (FLONASE) 50 MCG/ACT nasal spray TAKE 2 PUFFS IN EACH NOSTRIL EVERY DAY (Patient taking differently: TAKE 2 PUFFS IN EACH NOSTRIL EVERY DAY, takes as prn)  . furosemide (LASIX) 20 MG tablet TAKE TWO TABLETS TWICE DAILY  . levothyroxine (SYNTHROID, LEVOTHROID) 50 MCG tablet TAKE 1 TABLET EVERY DAY ON EMPTY STOMACHWITH A GLASS OF WATER AT LEAST 30-60 MINBEFORE BREAKFAST  . mirabegron ER (MYRBETRIQ) 50 MG TB24 tablet Take 50 mg by mouth daily.  . modafinil (PROVIGIL) 200 MG tablet Take 1 tablet (200 mg total) by mouth daily.  Vladimir Faster Glycol-Propyl Glycol (SYSTANE OP) Place 1 drop into both eyes 2 (two) times daily as needed (dry eyes).  . potassium chloride SA (K-DUR,KLOR-CON)  20 MEQ tablet Take 1 tablet (20 mEq total) by mouth daily.  . rosuvastatin (CRESTOR) 10 MG tablet Take 10 mg by mouth at bedtime.  . VENTOLIN HFA 108 (90 Base) MCG/ACT inhaler TAKE 2 PUFFS EVERY 8 HOURS AS NEEDED FORWHEEZING  . isosorbide mononitrate (IMDUR) 30 MG 24 hr tablet Take 1 tablet (30 mg total) by mouth daily.  [name] losartan (COZAAR) 25 MG tablet Take 1 tablet (25 mg total) by mouth daily.   No facility-administered encounter medications on file as of  12/29/2017.     Allergies (verified) Pravastatin and Prednisone   History: Past Medical History:  Diagnosis Date  . Cervical spondylosis 10/01/2013  . Chronic diastolic CHF (congestive heart failure) ([place])    a. 07/2016 Echo: >55%; b. 10/2016 Echo: EF 55-60%, Gr1 DD, Ao sclerosis w/o stenosis, sev dil LA; c. 08/2017 Echo: EF 60-65%, no rwma, Gr2 DD, mild AS, sev dil LA/RA.  [name] Coronary artery disease    a. 1998 s/p mini-cabg @ [facility] - LIMA->LAD;  b. 07/2016 St Echo: Inadequate HR w/ HTN response;  c.  08/2016 MV: EF 67%, no ischemia; d. 10/2016 NSTEMI/Cath: RCA 95p (4.0x26 Onyx DES), LIMA->LAD nl; e. 09/2017 Cath: LM 40/30, LAD 100ost, RI 80, LCX nl, OM2/3 nl, RCA patent stent, 77m, LIMA->LAD nl-->Med Rx.  . Depression   . GERD (gastroesophageal reflux disease)   . Hyperlipidemia   . Hypertension   . PAF (paroxysmal atrial fibrillation) ([facility])    a. s/p DCCV-->maintaining sinus on amiodarone;  b. CHA2DS2VASc = 5-->eliquis.  . Parkinson's disease ([place])    tremors  . Pleural effusion, right    a. 09/2017 s/p thoracentesis.  . Pulmonary embolism ([place]) 2011  . Pulmonary fibrosis ([place])   . 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 Sleep apnea    wears CPAP   Past Surgical History:  Procedure Laterality Date  . BACK SURGERY  1960  . CARDIAC CATHETERIZATION    . CHOLECYSTECTOMY  2010  . CORONARY ARTERY BYPASS GRAFT  01/07/1997  . CORONARY STENT INTERVENTION N/A 10/31/2016   Procedure: Coronary Stent Intervention;  Surgeon: Wellington Hampshire, MD;  Location: [facility];  Service: Cardiovascular;  Laterality: N/A;  . ELECTROPHYSIOLOGIC STUDY N/A 07/14/2015   Procedure: CARDIOVERSION;  Surgeon: Yolonda Kida, MD;  Location: [facility];  Service: Cardiovascular;  Laterality: N/A;  . ELECTROPHYSIOLOGIC STUDY N/A 10/12/2015   Procedure: CARDIOVERSION;  Surgeon: Minna Merritts, MD;  Location: [facility];  Service: Cardiovascular;  Laterality: N/A;  . LEFT HEART CATH AND CORONARY ANGIOGRAPHY N/A  10/31/2016   Procedure: Left Heart Cath and Coronary Angiography;  Surgeon: Wellington Hampshire, MD;  Location: [facility];  Service: Cardiovascular;  Laterality: N/A;  . OTHER SURGICAL HISTORY  1998   Bypass  . RIGHT/LEFT HEART CATH AND CORONARY ANGIOGRAPHY N/A 09/18/2017   Procedure: RIGHT/LEFT HEART CATH AND CORONARY ANGIOGRAPHY;  Surgeon: Wellington Hampshire, MD;  Location: [facility];  Service: Cardiovascular;  Laterality: N/A;   Family History  Problem Relation Age of Onset  . Alcohol abuse Father    Social History   Socioeconomic History  . Marital status: Married    Spouse name: [name]  . Number of children: 1  . Years of education: 79  . Highest education level: Not on file  Occupational History  . Occupation: Retired    Comment: Designer, television/film set  Social Needs  . Financial resource strain: Not hard at all  . Food insecurity:    Worry:  Never true    Inability: Never true  . Transportation needs:    Medical: No    Non-medical: No  Tobacco Use  . Smoking status: Former Smoker    Packs/day: 1.00    Years: 40.00    Pack years: 40.00    Types: Cigarettes, Pipe, Cigars    Last attempt to quit: 04/18/1972    Years since quitting: 45.7  . Smokeless tobacco: Former Systems developer    Types: [place] date: 04/18/1972  Substance and Sexual Activity  . Alcohol use: Yes    Alcohol/week: 4.0 standard drinks    Types: 2 Cans of beer, 2 Shots of liquor per week    Comment: per 2 weeks   . Drug use: No  . Sexual activity: Yes  Lifestyle  . Physical activity:    Days per week: 0 days    Minutes per session: Not on file  . Stress: Not on file  Relationships  . Social connections:    Talks on phone: Not on file    Gets together: Not on file    Attends religious service: Not on file    Active member of club or organization: Not on file    Attends meetings of clubs or organizations: Not on file    Relationship status: Not on file  Other Topics Concern  . Not on  file  Social History Narrative   Lives w/ wife   Caffeine use: none   Right-handed   Tobacco Counseling Counseling given: Not Answered   Clinical Intake:  Pre-visit preparation completed: Yes  Pain : No/denies pain     Nutritional Status: BMI > 30  Obese Diabetes: No  How often do you need to have someone help you when you read instructions, pamphlets, or other written materials from your doctor or pharmacy?: 4 - Often  Interpreter Needed?: No     Activities of Daily Living In your present state of health, do you have any difficulty performing the following activities: 12/29/2017 12/22/2017  Hearing? Y Y  Comment Declined hearing aids Per report of patient's wife, whom patient provided previous verbal permission to speak with   Vision? N -  Difficulty concentrating or making decisions? Y -  Walking or climbing stairs? Y -  Dressing or bathing? N -  Doing errands, shopping? Y -  Comment He does not drive 73% of the time.  -  Preparing Food and eating ? Y -  Comment Wife meal preps. Self feeds.  -  Using the Toilet? N -  In the past six months, have you accidently leaked urine? Y -  Comment Followed by Urology. Dr. Bernardo Heater -  Do you have problems with loss of bowel control? N -  Managing your Medications? Y -  Comment Wife assists -  Managing your Finances? Y -  Comment Wife assists -  Housekeeping or managing your Housekeeping? Y -  Comment Wife assists -  Some recent data might be hidden     Immunizations and Health Maintenance Immunization History  Administered Date(s) Administered  . Influenza Split 03/18/2012  . Influenza, High Dose Seasonal PF 01/13/2016, 01/26/2017, 12/29/2017  . Influenza-Unspecified 01/06/2016, 01/26/2017  . Pneumococcal Conjugate-13 05/10/2016  . Td 02/08/2017  . Zoster 04/19/2011  . Zoster Recombinat (Shingrix) 12/07/2017   Health Maintenance Due  Topic Date Due  . PNA vac Low Risk Adult (2 of 2 - PPSV23) 05/10/2017    Patient  Care Team: [name], MD as PCP -  General (Family Medicine) Wellington Hampshire, MD as PCP - Cardiology (Cardiology) Knox Royalty, RN as [place] any recent [place] you may have received from other than [facility] providers in the past year (date may be approximate).    Assessment:   This is a routine wellness examination for [place].  The goal of the wellness visit is to assist the patient how to close the gaps in care and create a preventative care plan for the patient.   The roster of all physicians providing medical care to patient is listed in the Snapshot section of the chart.  Osteoporosis risk reviewed.    Safety issues reviewed; Smoke and carbon monoxide detectors in the home. No firearms in the home. Wears seatbelts when driving or riding with others. No violence in the home.  They do not have excessive sun exposure.  Discussed the need for sun protection: hats, long sleeves and the use of sunscreen if there is significant sun exposure.  Depression- PHQ 2 &9 complete.  Currently in counseling. reports doing better.   Patient is alert, normal appearance, oriented to person/place/and time.  Correctly identified the president of the [place] and recalls of 3/3 words. Performs simple calculations and can read correct time from watch face. Displays appropriate judgement.  Wife assists with ADLs as needed.   BMI- discussed the importance of a healthy diet, water intake and the benefits of aerobic exercise. Educational material provided.  Low sodium diet encouraged. Low carb diet encouraged. Monitor fluid restriction.  CHF-followed by cardiologist. Fluid restriction. Reports stopped plavix as directed.   Dental- UTD.  Eye- Visual acuity not assessed per patient preference since they have regular follow up with the ophthalmologist.    Sleep patterns- Sleeps 10 hours at night.  Wakes feeling rested.  CPAP in use.  High dose  influenza administered L deltoid, tolerated well. Educational material provided. No distress or verbal complaints during or after injection.  Pneumovax 23 deferred.  Patient Concerns: None at this time. Follow up with PCP as needed.  Hearing/Vision screen Hearing Screening Comments: Declined hearing aids Difficulty hearing conversational tones in a crowd Vision Screening Comments: Followed by [facility] Annual visits Visual acuity not assessed per patient preference since they have regular follow up with the ophthalmologist  Dietary issues and exercise activities discussed: Current Exercise Habits: The patient does not participate in regular exercise at present  Goals    . DIET - INCREASE LEAN PROTEINS     Low sodium diet Low carb diet    . Increase physical activity     Water aerobics      Depression Screen PHQ 2/9 Scores 12/29/2017 11/02/2017 05/25/2017 05/19/2017  PHQ - 2 Score 4 6 0 0  PHQ- 9 Score 13 21 3  -    Fall Risk Fall Risk  12/29/2017 12/22/2017 12/12/2017 05/25/2017 05/19/2017  Falls in the past year? No (No Data) No Yes Yes  Comment - No new/ recent falls, Per report of patient's wife, whom patient provided previous verbal permission to speak with  - - -  Number falls in past yr: - - - 1 1  Injury with Fall? - - - - Yes  Risk for fall due to : - - Impaired balance/gait;Medication side effect - -  Risk for fall due to: Comment - - Parkinson's Disease - -  Follow up - - - - -   Cognitive Function:     6CIT Screen 12/29/2017  What Year?  0 points  What month? 0 points  What time? 0 points  Count back from 20 0 points  Months in reverse 0 points  Repeat phrase 0 points  Total Score 0    Screening Tests Health Maintenance  Topic Date Due  . PNA vac Low Risk Adult (2 of 2 - PPSV23) 05/10/2017  . TETANUS/TDAP  02/09/2027  . INFLUENZA VACCINE  Completed      Plan:    End of life planning; Advance aging; Advanced directives discussed. Copy of current  HCPOA/Living Will requested.    I have personally reviewed and noted the following in the patient's chart:   . Medical and social history . Use of alcohol, tobacco or illicit drugs  . Current medications and supplements . Functional ability and status . Nutritional status . Physical activity . Advanced directives . List of other physicians . Hospitalizations, surgeries, and ER visits in previous 12 months . Vitals . Screenings to include cognitive, depression, and falls . Referrals and appointments  In addition, I have reviewed and discussed with patient certain preventive protocols, quality metrics, and best practice recommendations. A written personalized care plan for preventive services as well as general preventive health recommendations were provided to patient.     Varney Biles, LPN   [id number]   Agree with plan. Mable Paris, NP

## 2017-12-29 NOTE — Patient Instructions (Addendum)
  Andre [name] , Thank you for taking time to come for your Medicare Wellness Visit. I appreciate your ongoing commitment to your health goals. Please review the following plan we discussed and let me know if I can assist you in the future.   Follow up as needed.    Bring a copy of your [place] and/or Living Will to be scanned into chart.  Have a great day!  These are the goals we discussed: Goals    . DIET - INCREASE LEAN PROTEINS     Low sodium diet Low carb diet    . Increase physical activity     Water aerobics       This is a list of the screening recommended for you and due dates:  Health Maintenance  Topic Date Due  . Pneumonia vaccines (2 of 2 - PPSV23) 05/10/2017  . Tetanus Vaccine  02/09/2027  . Flu Shot  Completed

## 2018-01-01 ENCOUNTER — Encounter: Payer: Self-pay | Admitting: *Deleted

## 2018-01-01 ENCOUNTER — Ambulatory Visit: Payer: Self-pay | Admitting: *Deleted

## 2018-01-01 ENCOUNTER — Other Ambulatory Visit: Payer: Self-pay | Admitting: *Deleted

## 2018-01-01 DIAGNOSIS — D692 Other nonthrombocytopenic purpura: Secondary | ICD-10-CM | POA: Diagnosis not present

## 2018-01-01 DIAGNOSIS — L570 Actinic keratosis: Secondary | ICD-10-CM | POA: Diagnosis not present

## 2018-01-01 DIAGNOSIS — L578 Other skin changes due to chronic exposure to nonionizing radiation: Secondary | ICD-10-CM | POA: Diagnosis not present

## 2018-01-01 DIAGNOSIS — L304 Erythema intertrigo: Secondary | ICD-10-CM | POA: Diagnosis not present

## 2018-01-01 DIAGNOSIS — L219 Seborrheic dermatitis, unspecified: Secondary | ICD-10-CM | POA: Diagnosis not present

## 2018-01-01 NOTE — Patient Outreach (Signed)
[facility]) Care Management [facility] CM Case Closure  01/01/2018  JOBANY MONTELLANO 08-16-1935 [id number]  Successful telephone outreach to Andre [name], spouse of Ector Laurel, 82 y/o male referred to [place] by insurance provider; patient has history including, but not limited to, pAF; Parkinson's Disease; CAD with previous CABG; HTN/ HLD; d CHF; OSA; GERD; DDD; and anxiety/ depression. Patient had recent hospitalization June 3-6, 2019 for shortness of breath/ dyspnea secondary to pleural effusion, requiring thoracentesis.  Patient provided previous verbal permission for me to speak to his spouse "at any time" regarding his health care, stating that his wife handles "most" of all of his health care matters.  HIPAA/ identity verified with patient's wife today.  Explained to patient's spouse that I had received a message from [place] that she had called [facility] CM office to cancel today's previously scheduled [place] CM initial home visit; spouse confirms today that she and her husband do wish to cancel today's scheduled appointment for home visit.  Wife stated that she and patient had time to discuss [place] program, and she stated, "we just don't feel it is necessary; we have enough doctors and nurses involved."  Spouse stated that she and patient attended medicare Wellness visit last week, and that she feels like the nurse that conducted this visit "is all we need."   I attempted to re-explain [facility] CM services/ re-engage patient's spouse during phone call today, and she stated that she understands [facility] CM program, as she is an "independent Medical illustrator."  Despite my attempts to re-engage spouse/ patient today, spouse continually declined further participation in [place] program, stating that she or patient would contact me should patient reconsider and wish to participate.  Spousedenyfurther issues, concerns, or problems today. Iconfirmed that patient/  spouse havemy direct phone number, the main [facility] CM office phone number, and the [facility] CM 24-hour nurse advice phone number should issues arise in the future and patient wish to participate in [facility] CM program.  Plan:  Will close [place] case as requested by patient/ spouse, and will make patient's PCP aware of same.  Oneta Rack, RN, BSN, [employer] [facility] Care Management  [phone]

## 2018-01-03 ENCOUNTER — Ambulatory Visit: Payer: PPO | Admitting: Urology

## 2018-01-03 ENCOUNTER — Encounter: Payer: Self-pay | Admitting: Urology

## 2018-01-03 VITALS — BP 167/82 | HR 66 | Ht 67.0 in | Wt 247.6 lb

## 2018-01-03 DIAGNOSIS — R310 Gross hematuria: Secondary | ICD-10-CM

## 2018-01-03 DIAGNOSIS — Z87442 Personal history of urinary calculi: Secondary | ICD-10-CM

## 2018-01-03 LAB — URINALYSIS, COMPLETE
Bilirubin, UA: NEGATIVE
Glucose, UA: NEGATIVE
Ketones, UA: NEGATIVE
Nitrite, UA: NEGATIVE
Protein, UA: NEGATIVE
RBC, UA: NEGATIVE
Specific Gravity, UA: 1.02 (ref 1.005–1.030)
Urobilinogen, Ur: 0.2 mg/dL (ref 0.2–1.0)
pH, UA: 7 (ref 5.0–7.5)

## 2018-01-03 LAB — MICROSCOPIC EXAMINATION: Epithelial Cells (non renal): NONE SEEN /hpf (ref 0–10)

## 2018-01-03 NOTE — Progress Notes (Signed)
01/03/2018 1:34 PM   Andre [name] [birth year] [id number]  Referring provider: [name], MD [address] [place], [place] [address]  Chief Complaint  Patient presents with  . Hematuria    HPI: 82 year old male was seen at [facility] on 12/17/2017 with a 3-4-day history of total gross painless hematuria.  He has previously been followed for urinary frequency and nocturia.  He has a history of a large left renal cyst.  His hematuria resolved shortly after that visit.  He denies flank/abdominal/pelvic pain.  He has stable lower urinary tract symptoms.  Severity was rated mild.  There were no identifiable precipitating, aggravating or alleviating factors.  He is on Eliquis and Plavix.   PMH: Past Medical History:  Diagnosis Date  . Cervical spondylosis 10/01/2013  . Chronic diastolic CHF (congestive heart failure) ([place])    a. 07/2016 Echo: >55%; b. 10/2016 Echo: EF 55-60%, Gr1 DD, Ao sclerosis w/o stenosis, sev dil LA; c. 08/2017 Echo: EF 60-65%, no rwma, Gr2 DD, mild AS, sev dil LA/RA.  [name] Coronary artery disease    a. 1998 s/p mini-cabg @ [facility] - LIMA->LAD;  b. 07/2016 St Echo: Inadequate HR w/ HTN response;  c.  08/2016 MV: EF 67%, no ischemia; d. 10/2016 NSTEMI/Cath: RCA 95p (4.0x26 Onyx DES), LIMA->LAD nl; e. 09/2017 Cath: LM 40/30, LAD 100ost, RI 80, LCX nl, OM2/3 nl, RCA patent stent, 66m, LIMA->LAD nl-->Med Rx.  . Depression   . GERD (gastroesophageal reflux disease)   . Hyperlipidemia   . Hypertension   . PAF (paroxysmal atrial fibrillation) ([facility])    a. s/p DCCV-->maintaining sinus on amiodarone;  b. CHA2DS2VASc = 5-->eliquis.  . Parkinson's disease ([place])    tremors  . Pleural effusion, right    a. 09/2017 s/p thoracentesis.  . Pulmonary embolism ([place]) 2011  . Pulmonary fibrosis ([place])   . 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 Sleep apnea    wears CPAP    Surgical History: Past Surgical History:  Procedure Laterality Date  . BACK SURGERY  1960  .  CARDIAC CATHETERIZATION    . CHOLECYSTECTOMY  2010  . CORONARY ARTERY BYPASS GRAFT  01/07/1997  . CORONARY STENT INTERVENTION N/A 10/31/2016   Procedure: Coronary Stent Intervention;  Surgeon: Wellington Hampshire, MD;  Location: [facility];  Service: Cardiovascular;  Laterality: N/A;  . ELECTROPHYSIOLOGIC STUDY N/A 07/14/2015   Procedure: CARDIOVERSION;  Surgeon: Yolonda Kida, MD;  Location: [facility];  Service: Cardiovascular;  Laterality: N/A;  . ELECTROPHYSIOLOGIC STUDY N/A 10/12/2015   Procedure: CARDIOVERSION;  Surgeon: Minna Merritts, MD;  Location: [facility];  Service: Cardiovascular;  Laterality: N/A;  . LEFT HEART CATH AND CORONARY ANGIOGRAPHY N/A 10/31/2016   Procedure: Left Heart Cath and Coronary Angiography;  Surgeon: Wellington Hampshire, MD;  Location: [facility];  Service: Cardiovascular;  Laterality: N/A;  . OTHER SURGICAL HISTORY  1998   Bypass  . RIGHT/LEFT HEART CATH AND CORONARY ANGIOGRAPHY N/A 09/18/2017   Procedure: RIGHT/LEFT HEART CATH AND CORONARY ANGIOGRAPHY;  Surgeon: Wellington Hampshire, MD;  Location: [facility];  Service: Cardiovascular;  Laterality: N/A;    Home Medications:  Allergies as of 01/03/2018      Reactions   Pravastatin Other (See Comments)   Prednisone Other (See Comments)   Pt states that med makes him hyper Pt states that med makes him hyper      Medication List        Accurate as of  01/03/18  1:34 PM. Always use your most recent med list.          carbidopa-levodopa 25-250 MG tablet Commonly known as:  SINEMET IR Take 1.5 tablets by mouth 3 (three) times daily.   carvedilol 3.125 MG tablet Commonly known as:  COREG Take 2 tablets (6.25 mg total) by mouth 2 (two) times daily with a meal.   cyclobenzaprine 5 MG tablet Commonly known as:  FLEXERIL Take 5 mg by mouth daily as needed for muscle spasms.   ELIQUIS 5 MG Tabs tablet Generic drug:  apixaban TAKE ONE TABLET TWICE DAILY   entacapone 200 MG  tablet Commonly known as:  COMTAN Take 1 tablet (200 mg total) by mouth 3 (three) times daily.   EPINEPHrine 0.3 mg/0.3 mL Soaj injection Commonly known as:  EPI-PEN Inject 0.3 mg as directed once as needed (allergic reaction). Reported on 10/27/2015   escitalopram 10 MG tablet Commonly known as:  LEXAPRO Take 1 tablet (10 mg total) by mouth daily.   esomeprazole 20 MG capsule Commonly known as:  NEXIUM Take 40 mg by mouth daily at 12 noon.   fluticasone 50 MCG/ACT nasal spray Commonly known as:  FLONASE TAKE 2 PUFFS IN EACH NOSTRIL EVERY DAY   furosemide 20 MG tablet Commonly known as:  LASIX TAKE TWO TABLETS TWICE DAILY   HYDROcodone-acetaminophen 5-325 MG tablet Commonly known as:  NORCO/VICODIN Take by mouth.   isosorbide mononitrate 30 MG 24 hr tablet Commonly known as:  IMDUR Take 1 tablet (30 mg total) by mouth daily.   ketoconazole 2 % cream Commonly known as:  NIZORAL   levothyroxine 50 MCG tablet Commonly known as:  SYNTHROID, LEVOTHROID TAKE 1 TABLET EVERY DAY ON EMPTY STOMACHWITH A GLASS OF WATER AT LEAST 30-60 MINBEFORE BREAKFAST   losartan 25 MG tablet Commonly known as:  COZAAR Take 1 tablet (25 mg total) by mouth daily.   modafinil 200 MG tablet Commonly known as:  PROVIGIL Take 1 tablet (200 mg total) by mouth daily.   mometasone 0.1 % cream Commonly known as:  ELOCON   MYRBETRIQ 50 MG Tb24 tablet Generic drug:  mirabegron ER Take 50 mg by mouth daily.   potassium chloride SA 20 MEQ tablet Commonly known as:  K-DUR,KLOR-CON Take 1 tablet (20 mEq total) by mouth daily.   rosuvastatin 10 MG tablet Commonly known as:  CRESTOR Take 10 mg by mouth at bedtime.   SYSTANE OP Place 1 drop into both eyes 2 (two) times daily as needed (dry eyes).   VENTOLIN HFA 108 (90 Base) MCG/ACT inhaler Generic drug:  albuterol TAKE 2 PUFFS EVERY 8 HOURS AS NEEDED FORWHEEZING   albuterol (2.5 MG/3ML) 0.083% nebulizer solution Commonly known as:   PROVENTIL Take 3 mLs (2.5 mg total) by nebulization every 4 (four) hours as needed for wheezing or shortness of breath.       Allergies:  Allergies  Allergen Reactions  . Pravastatin Other (See Comments)  . Prednisone Other (See Comments)    Pt states that med makes him hyper Pt states that med makes him hyper    Family History: Family History  Problem Relation Age of Onset  . Alcohol abuse Father     Social History:  reports that he quit smoking about 45 years ago. His smoking use included cigarettes, pipe, and cigars. He has a 40.00 pack-year smoking history. He quit smokeless tobacco use about 45 years ago.  His smokeless tobacco use included chew. He reports that he drinks  about 4.0 standard drinks of alcohol per week. He reports that he does not use drugs.  ROS: UROLOGY Frequent Urination?: Yes Hard to postpone urination?: Yes Burning/pain with urination?: No Get up at night to urinate?: No Leakage of urine?: No Urine stream starts and stops?: No Trouble starting stream?: No Do you have to strain to urinate?: No Blood in urine?: Yes Urinary tract infection?: No Sexually transmitted disease?: No Injury to kidneys or bladder?: No Painful intercourse?: No Weak stream?: No Erection problems?: No Penile pain?: No  Gastrointestinal Nausea?: No Vomiting?: No Indigestion/heartburn?: No Diarrhea?: No Constipation?: Yes  Constitutional Fever: No Night sweats?: Yes Weight loss?: No Fatigue?: Yes  Skin Skin rash/lesions?: Yes Itching?: No  Eyes Blurred vision?: Yes Double vision?: No  Ears/Nose/Throat Sore throat?: No Sinus problems?: No  Hematologic/Lymphatic Swollen glands?: No Easy bruising?: Yes  Cardiovascular Leg swelling?: No Chest pain?: No  Respiratory Cough?: No Shortness of breath?: No  Endocrine Excessive thirst?: No  Musculoskeletal Back pain?: No Joint pain?: No  Neurological Headaches?: No Dizziness?:  Yes  Psychologic Depression?: Yes Anxiety?: No  Physical Exam: BP (!) 167/82 (BP Location: Left Arm, Patient Position: Sitting, Cuff Size: Large)   Pulse 66   Ht 5\' 7"  (1.702 m)   Wt 247 lb 9.6 oz (112.3 kg)   BMI 38.78 kg/m   Constitutional:  Alert and oriented, No acute distress. HEENT: [place] AT, moist mucus membranes.  Trachea midline, no masses. Cardiovascular: No clubbing, cyanosis, or edema. Respiratory: Normal respiratory effort, no increased work of breathing. GI: Abdomen is soft, nontender, nondistended, no abdominal masses GU: No CVA tenderness Lymph: No cervical or inguinal lymphadenopathy. Skin: No rashes, bruises or suspicious lesions. Neurologic: Grossly intact, no focal deficits, moving all 4 extremities. Psychiatric: Normal mood and affect.  Laboratory Data:  Urinalysis Dipstick 1+ leukocytes, nitrite negative Microscopy 6-10 WBCs   Assessment & Plan:   82 year old male with a recent episode of total gross painless hematuria which has resolved.  I discussed the standard evaluation for gross hematuria and have recommended proceeding with a CT urogram and cystoscopy.  All questions were answered and he desires to proceed.  Urinalysis today shows pyuria and a urine culture was ordered.   Abbie Sons, [place] [address] [place], [place] [address] [phone]

## 2018-01-04 DIAGNOSIS — J441 Chronic obstructive pulmonary disease with (acute) exacerbation: Secondary | ICD-10-CM | POA: Diagnosis not present

## 2018-01-04 DIAGNOSIS — J45909 Unspecified asthma, uncomplicated: Secondary | ICD-10-CM | POA: Diagnosis not present

## 2018-01-04 DIAGNOSIS — G4733 Obstructive sleep apnea (adult) (pediatric): Secondary | ICD-10-CM | POA: Diagnosis not present

## 2018-01-04 DIAGNOSIS — J849 Interstitial pulmonary disease, unspecified: Secondary | ICD-10-CM | POA: Diagnosis not present

## 2018-01-04 DIAGNOSIS — J449 Chronic obstructive pulmonary disease, unspecified: Secondary | ICD-10-CM | POA: Diagnosis not present

## 2018-01-06 ENCOUNTER — Other Ambulatory Visit: Payer: Self-pay | Admitting: Cardiovascular Disease

## 2018-01-06 LAB — CULTURE, URINE COMPREHENSIVE

## 2018-01-08 ENCOUNTER — Telehealth: Payer: Self-pay

## 2018-01-08 NOTE — Telephone Encounter (Signed)
-----   Message from Abbie Sons, MD sent at 01/07/2018 11:45 AM EDT ----- Urine culture was negative for infection.  Proceed with hematuria evaluation as discussed at last week's office visit.

## 2018-01-08 NOTE — Telephone Encounter (Signed)
Patient notified on vmail 

## 2018-01-11 ENCOUNTER — Telehealth: Payer: Self-pay | Admitting: Urology

## 2018-01-11 ENCOUNTER — Ambulatory Visit
Admission: RE | Admit: 2018-01-11 | Discharge: 2018-01-11 | Disposition: A | Discharge status: Home / Self Care | Payer: PPO | Source: Ambulatory Visit | Attending: Urology | Admitting: Urology

## 2018-01-11 ENCOUNTER — Other Ambulatory Visit
Admission: RE | Admit: 2018-01-11 | Discharge: 2018-01-11 | Disposition: A | Discharge status: Home / Self Care | Payer: PPO | Source: Ambulatory Visit | Attending: Urology | Admitting: Urology

## 2018-01-11 DIAGNOSIS — N210 Calculus in bladder: Secondary | ICD-10-CM | POA: Insufficient documentation

## 2018-01-11 DIAGNOSIS — R310 Gross hematuria: Secondary | ICD-10-CM

## 2018-01-11 DIAGNOSIS — J90 Pleural effusion, not elsewhere classified: Secondary | ICD-10-CM | POA: Insufficient documentation

## 2018-01-11 DIAGNOSIS — N281 Cyst of kidney, acquired: Secondary | ICD-10-CM | POA: Insufficient documentation

## 2018-01-11 LAB — CREATININE, SERUM
Creatinine, Ser: 0.83 mg/dL (ref 0.61–1.24)
GFR calc Af Amer: >60 mL/min (ref >60)
GFR calc non Af Amer: >60 mL/min (ref >60)

## 2018-01-11 MED ORDER — IOHEXOL 300 MG/ML  SOLN
150.0000 mL | Freq: Once | INTRAMUSCULAR | Status: AC | PRN
  Administered 2018-01-11: 125 mL via INTRAVENOUS

## 2018-01-11 NOTE — Telephone Encounter (Signed)
Entered

## 2018-01-11 NOTE — Telephone Encounter (Signed)
Need a creatinine order for a CT scan being done today in [place] please and thank you   Sharyn Lull

## 2018-01-17 ENCOUNTER — Ambulatory Visit: Payer: PPO | Admitting: Urology

## 2018-01-17 DIAGNOSIS — R310 Gross hematuria: Secondary | ICD-10-CM

## 2018-01-17 LAB — URINALYSIS, COMPLETE
Bilirubin, UA: NEGATIVE
Glucose, UA: NEGATIVE
Ketones, UA: NEGATIVE
Leukocytes, UA: NEGATIVE
Nitrite, UA: NEGATIVE
Protein, UA: NEGATIVE
RBC, UA: NEGATIVE
Specific Gravity, UA: 1.015 (ref 1.005–1.030)
Urobilinogen, Ur: 0.2 mg/dL (ref 0.2–1.0)
pH, UA: 7 (ref 5.0–7.5)

## 2018-01-17 NOTE — Progress Notes (Signed)
   01/17/18  CC: No chief complaint on file.   HPI: Seen September 2019 after an episode of total gross painless hematuria.  He denies recurrent hematuria.  CTU performed 01/11/2018 showed a large left renal cysts and a punctate bladder calculus.  There were no vitals taken for this visit. NED. A&Ox3.   No respiratory distress   Abd soft, NT, ND Normal phallus with bilateral descended testicles  Cystoscopy Procedure Note  Patient identification was confirmed, informed consent was obtained, and patient was prepped using Betadine solution.  Lidocaine jelly was administered per urethral meatus.     Pre-Procedure: - Inspection reveals a normal caliber ureteral meatus.  Procedure: The flexible cystoscope was introduced without difficulty - No urethral strictures/lesions are present. - Moderate lateral lobe enlargement with hypervascularity prostate  - Mild elevation bladder neck - Bilateral ureteral orifices identified - Bladder mucosa  reveals no ulcers, tumors, or lesions -Punctate bladder calculus noted -Mild trabeculation  Retroflexion shows no intravesical median lobe   Post-Procedure: - Patient tolerated the procedure well  Assessment/ Plan: No significant findings on cystoscopy and CTU.  Follow-up 6 months.  He instructed to call earlier for recurrent gross hematuria.   Abbie Sons, MD

## 2018-01-22 DIAGNOSIS — J61 Pneumoconiosis due to asbestos and other mineral fibers: Secondary | ICD-10-CM | POA: Diagnosis not present

## 2018-01-22 DIAGNOSIS — I358 Other nonrheumatic aortic valve disorders: Secondary | ICD-10-CM | POA: Diagnosis not present

## 2018-01-22 DIAGNOSIS — M47812 Spondylosis without myelopathy or radiculopathy, cervical region: Secondary | ICD-10-CM | POA: Diagnosis not present

## 2018-01-22 DIAGNOSIS — Z87891 Personal history of nicotine dependence: Secondary | ICD-10-CM | POA: Diagnosis not present

## 2018-01-22 DIAGNOSIS — J90 Pleural effusion, not elsewhere classified: Secondary | ICD-10-CM | POA: Diagnosis not present

## 2018-01-22 DIAGNOSIS — Z77090 Contact with and (suspected) exposure to asbestos: Secondary | ICD-10-CM | POA: Diagnosis not present

## 2018-01-22 DIAGNOSIS — R918 Other nonspecific abnormal finding of lung field: Secondary | ICD-10-CM | POA: Diagnosis not present

## 2018-01-22 DIAGNOSIS — J849 Interstitial pulmonary disease, unspecified: Secondary | ICD-10-CM | POA: Diagnosis not present

## 2018-01-22 DIAGNOSIS — R0609 Other forms of dyspnea: Secondary | ICD-10-CM | POA: Diagnosis not present

## 2018-01-22 DIAGNOSIS — R0602 Shortness of breath: Secondary | ICD-10-CM | POA: Diagnosis not present

## 2018-01-22 DIAGNOSIS — C450 Mesothelioma of pleura: Secondary | ICD-10-CM | POA: Diagnosis not present

## 2018-01-22 DIAGNOSIS — J929 Pleural plaque without asbestos: Secondary | ICD-10-CM | POA: Diagnosis not present

## 2018-01-23 ENCOUNTER — Telehealth: Payer: Self-pay | Admitting: Cardiovascular Disease

## 2018-01-23 NOTE — Telephone Encounter (Signed)
Routing to Dr Arida.  

## 2018-01-23 NOTE — Telephone Encounter (Signed)
° °   [facility] Pre-operative Risk Assessment    Request for surgical clearance:  1. What type of surgery is being performed? c3- c6 steroid injection (3 injections)  2. When is this surgery scheduled?  10/14 at 11 am   3. What type of clearance is required (medical clearance vs. Pharmacy clearance to hold med vs. Both)? Pharmacy   4. Are there any medications that need to be held prior to surgery and how long? Eliquis 3 days prior   5. Practice name and name of physician performing surgery?  [facility] Dr. Retta Mac   6. What is your office phone number [phone]   7.   What is your office fax number [phone]  8.   Anesthesia type (None, local, MAC, general) ? Unknown    Clarisse Gouge 01/23/2018, 3:08 PM  _________________________________________________________________   (provider comments below)

## 2018-01-24 DIAGNOSIS — J45909 Unspecified asthma, uncomplicated: Secondary | ICD-10-CM | POA: Diagnosis not present

## 2018-01-24 DIAGNOSIS — J449 Chronic obstructive pulmonary disease, unspecified: Secondary | ICD-10-CM | POA: Diagnosis not present

## 2018-01-24 DIAGNOSIS — G4733 Obstructive sleep apnea (adult) (pediatric): Secondary | ICD-10-CM | POA: Diagnosis not present

## 2018-01-24 DIAGNOSIS — J441 Chronic obstructive pulmonary disease with (acute) exacerbation: Secondary | ICD-10-CM | POA: Diagnosis not present

## 2018-01-25 NOTE — Telephone Encounter (Signed)
Moderate risk.  Hold Eliquis 3 days before injections.

## 2018-01-25 NOTE — Telephone Encounter (Addendum)
Attempted a few times to call [facility]..Dr. Retta Mac... No answer and no voicemail.   Dr. Tyrell Antonio note faxed/ routed to the number provided and pt advised to hold Eliquis for the 3 days prior but to check with Dr. Retta Mac to be sure he is still scheduled for 01/29/18.

## 2018-01-26 ENCOUNTER — Other Ambulatory Visit: Payer: Self-pay | Admitting: Family Medicine

## 2018-01-26 NOTE — Telephone Encounter (Signed)
Please call pt wife has question regarding when pt can restart his Eliquis after his surgery.

## 2018-01-26 NOTE — Telephone Encounter (Signed)
Advised wife that the doctor doing the injection will advise patient on when to resume the blood thinner. She verbalized understanding.

## 2018-01-29 ENCOUNTER — Encounter: Payer: Self-pay | Admitting: Urology

## 2018-01-29 DIAGNOSIS — M47812 Spondylosis without myelopathy or radiculopathy, cervical region: Secondary | ICD-10-CM | POA: Diagnosis not present

## 2018-02-03 DIAGNOSIS — J449 Chronic obstructive pulmonary disease, unspecified: Secondary | ICD-10-CM | POA: Diagnosis not present

## 2018-02-03 DIAGNOSIS — J45909 Unspecified asthma, uncomplicated: Secondary | ICD-10-CM | POA: Diagnosis not present

## 2018-02-03 DIAGNOSIS — J849 Interstitial pulmonary disease, unspecified: Secondary | ICD-10-CM | POA: Diagnosis not present

## 2018-02-03 DIAGNOSIS — G4733 Obstructive sleep apnea (adult) (pediatric): Secondary | ICD-10-CM | POA: Diagnosis not present

## 2018-02-03 DIAGNOSIS — J441 Chronic obstructive pulmonary disease with (acute) exacerbation: Secondary | ICD-10-CM | POA: Diagnosis not present

## 2018-02-05 ENCOUNTER — Other Ambulatory Visit: Payer: Self-pay

## 2018-02-05 ENCOUNTER — Ambulatory Visit: Payer: PPO | Admitting: Neurology

## 2018-02-05 ENCOUNTER — Encounter: Payer: Self-pay | Admitting: Neurology

## 2018-02-05 VITALS — BP 165/81 | HR 72 | Resp 20 | Ht 67.0 in | Wt 250.0 lb

## 2018-02-05 DIAGNOSIS — G20 Parkinson's disease: Secondary | ICD-10-CM | POA: Diagnosis not present

## 2018-02-05 MED ORDER — MODAFINIL 200 MG PO TABS: 200.0000 mg | ORAL_TABLET | Freq: Two times a day (BID) | ORAL | 1 refills | Status: DC

## 2018-02-05 NOTE — Progress Notes (Signed)
Reason for visit: Parkinson's disease  Andre [name] is an 82 y.o. male  History of present illness:  [name] is an 82 year old right-handed white male with a history of Parkinson's disease.  The patient has cut back on his Sinemet dosing to take 1.5 of the 25/250 mg tablets 3 times a day.  The dyskinesias have improved.  The patient still has a lot of excessive daytime drowsiness, he has been placed on Provigil taking 200 milligrams in the morning, this has helped but he still is quite fatigued.  He has a lot of shortness of breath with minimal activity, he will be seen by a pulmonologist at [facility] in the near future.  The patient has not had any falls.  He denies issues with choking with swallowing.  The patient recently had an epidural steroid injection in the neck which has helped his neck pain.  The patient is on CPAP at night, he never feels rested even when using this treatment religiously.  He returns to this office for an evaluation.  Past Medical History:  Diagnosis Date  . Cervical spondylosis 10/01/2013  . Chronic diastolic CHF (congestive heart failure) ([place])    a. 07/2016 Echo: >55%; b. 10/2016 Echo: EF 55-60%, Gr1 DD, Ao sclerosis w/o stenosis, sev dil LA; c. 08/2017 Echo: EF 60-65%, no rwma, Gr2 DD, mild AS, sev dil LA/RA.  Andre [name] Coronary artery disease    a. 1998 s/p mini-cabg @ [facility] - LIMA->LAD;  b. 07/2016 St Echo: Inadequate HR w/ HTN response;  c.  08/2016 MV: EF 67%, no ischemia; d. 10/2016 NSTEMI/Cath: RCA 95p (4.0x26 Onyx DES), LIMA->LAD nl; e. 09/2017 Cath: LM 40/30, LAD 100ost, RI 80, LCX nl, OM2/3 nl, RCA patent stent, 30m, LIMA->LAD nl-->Med Rx.  . Depression   . GERD (gastroesophageal reflux disease)   . Hyperlipidemia   . Hypertension   . PAF (paroxysmal atrial fibrillation) ([facility])    a. s/p DCCV-->maintaining sinus on amiodarone;  b. CHA2DS2VASc = 5-->eliquis.  . Parkinson's disease ([place])    tremors  . Pleural effusion, right    a. 09/2017 s/p thoracentesis.    . Pulmonary embolism (Andre [name]) 2011  . Pulmonary fibrosis (Andre [name])   . 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 Sleep apnea    wears CPAP    Past Surgical History:  Procedure Laterality Date  . BACK SURGERY  1960  . CARDIAC CATHETERIZATION    . CHOLECYSTECTOMY  2010  . CORONARY ARTERY BYPASS GRAFT  01/07/1997  . CORONARY STENT INTERVENTION N/A 10/31/2016   Procedure: Coronary Stent Intervention;  Surgeon: Wellington Hampshire, MD;  Location: [facility];  Service: Cardiovascular;  Laterality: N/A;  . ELECTROPHYSIOLOGIC STUDY N/A 07/14/2015   Procedure: CARDIOVERSION;  Surgeon: Yolonda Kida, MD;  Location: [facility];  Service: Cardiovascular;  Laterality: N/A;  . ELECTROPHYSIOLOGIC STUDY N/A 10/12/2015   Procedure: CARDIOVERSION;  Surgeon: Minna Merritts, MD;  Location: [facility];  Service: Cardiovascular;  Laterality: N/A;  . LEFT HEART CATH AND CORONARY ANGIOGRAPHY N/A 10/31/2016   Procedure: Left Heart Cath and Coronary Angiography;  Surgeon: Wellington Hampshire, MD;  Location: [facility];  Service: Cardiovascular;  Laterality: N/A;  . OTHER SURGICAL HISTORY  1998   Bypass  . RIGHT/LEFT HEART CATH AND CORONARY ANGIOGRAPHY N/A 09/18/2017   Procedure: RIGHT/LEFT HEART CATH AND CORONARY ANGIOGRAPHY;  Surgeon: Wellington Hampshire, MD;  Location: [facility];  Service: Cardiovascular;  Laterality: N/A;    Family History  Problem Relation Age of Onset  . Alcohol abuse Father     Social history:  reports that he quit smoking about 45 years ago. His smoking use included cigarettes, pipe, and cigars. He has a 40.00 pack-year smoking history. He quit smokeless tobacco use about 45 years ago.  His smokeless tobacco use included chew. He reports that he drinks about 4.0 standard drinks of alcohol per week. He reports that he does not use drugs.    Allergies  Allergen Reactions  . Pravastatin Other (See Comments)  . Prednisone Other (See Comments)    Pt states that med makes  him hyper Pt states that med makes him hyper    Medications:  Prior to Admission medications   Medication Sig Start Date End Date Taking? Authorizing Provider  albuterol (PROVENTIL) (2.5 MG/3ML) 0.083% nebulizer solution Take 3 mLs (2.5 mg total) by nebulization every 4 (four) hours as needed for wheezing or shortness of breath. Patient taking differently: Take 2.5 mg by nebulization as needed for wheezing or shortness of breath.  08/24/17  Yes Kasa, Maretta Bees, MD  carbidopa-levodopa (SINEMET) 25-250 MG tablet Take 1.5 tablets by mouth 3 (three) times daily. 11/13/17  Yes Kathrynn Ducking, MD  carvedilol (COREG) 3.125 MG tablet Take 2 tablets (6.25 mg total) by mouth 2 (two) times daily with a meal. 12/12/17 02/10/18 Yes Wellington Hampshire, MD  cyclobenzaprine (FLEXERIL) 5 MG tablet Take 5 mg by mouth daily as needed for muscle spasms.    Yes [provider]  ELIQUIS 5 MG TABS tablet TAKE ONE TABLET TWICE DAILY 02/06/17  Yes Wellington Hampshire, MD  entacapone (COMTAN) 200 MG tablet Take 1 tablet (200 mg total) by mouth 3 (three) times daily. 12/26/17  Yes Kathrynn Ducking, MD  escitalopram (LEXAPRO) 10 MG tablet Take 1 tablet (10 mg total) by mouth daily. 09/12/17  Yes [name], MD  esomeprazole (NEXIUM) 20 MG capsule Take 40 mg by mouth daily at 12 noon.   Yes [provider]  fluticasone (FLONASE) 50 MCG/ACT nasal spray TAKE 2 PUFFS IN EACH NOSTRIL EVERY DAY Patient taking differently: TAKE 2 PUFFS IN EACH NOSTRIL EVERY DAY, takes as prn 10/18/16  Yes Cook, Jayce G, DO  furosemide (LASIX) 20 MG tablet TAKE TWO TABLETS BY MOUTH TWICE DAILY 01/08/18  Yes Wellington Hampshire, MD  HYDROcodone-acetaminophen (NORCO/VICODIN) 5-325 MG tablet Take by mouth. 01/03/18  Yes [provider]  ketoconazole (NIZORAL) 2 % cream  01/01/18  Yes [provider]  levothyroxine (SYNTHROID, LEVOTHROID) 50 MCG tablet TAKE 1 TABLET BY MOUTH ONCE DAILY. TAKE ON EMPTY STOMACH WITH A GLASSOF  WATER AT LEAST 30-60 MINUTES BEFORE BREAKFAST 01/26/18  Yes [name], MD  mirabegron ER (MYRBETRIQ) 50 MG TB24 tablet Take 50 mg by mouth daily.   Yes [provider]  modafinil (PROVIGIL) 200 MG tablet Take 1 tablet (200 mg total) by mouth 2 (two) times daily. 02/05/18  Yes Kathrynn Ducking, MD  mometasone (ELOCON) 0.1 % cream  01/01/18  Yes [provider]  Polyethyl Glycol-Propyl Glycol (SYSTANE OP) Place 1 drop into both eyes 2 (two) times daily as needed (dry eyes).   Yes [provider]  potassium chloride SA (K-DUR,KLOR-CON) 20 MEQ tablet Take 1 tablet (20 mEq total) by mouth daily. 09/06/17  Yes Wellington Hampshire, MD  rosuvastatin (CRESTOR) 10 MG tablet Take 10 mg by mouth at bedtime.   Yes [provider]  VENTOLIN HFA 108 (90 Base) MCG/ACT  inhaler TAKE 2 PUFFS EVERY 8 HOURS AS NEEDED FORWHEEZING 12/02/16  Yes Cook, Jayce G, DO  EPINEPHrine 0.3 mg/0.3 mL IJ SOAJ injection Inject 0.3 mg as directed once as needed (allergic reaction). Reported on 10/27/2015 07/01/13   [provider]  isosorbide mononitrate (IMDUR) 30 MG 24 hr tablet Take 1 tablet (30 mg total) by mouth daily. 09/21/17 12/21/17  Henreitta Leber, MD  losartan (COZAAR) 25 MG tablet Take 1 tablet (25 mg total) by mouth daily. 09/21/17 12/21/17  Henreitta Leber, MD    ROS:  Out of a complete 14 system review of symptoms, the patient complains only of the following symptoms, and all other reviewed systems are negative.  Fatigue, excessive daytime drowsiness  Blood pressure (!) 165/81, pulse 72, resp. rate 20, height 5\' 7"  (1.702 m), weight 250 lb (113.4 kg).  Physical Exam  General: The patient is alert and cooperative at the time of the examination.  The patient is markedly obese.  Skin: No significant peripheral edema is noted.   Neurologic Exam  Mental status: The patient is alert and oriented x 3 at the time of the examination. The patient has apparent normal recent and  remote memory, with an apparently normal attention span and concentration ability.   Cranial nerves: Facial symmetry is present. Speech is normal, no aphasia or dysarthria is noted. Extraocular movements are full. Visual fields are full.  Mild masking of the face is seen.  Motor: The patient has good strength in all 4 extremities.  Sensory examination: Soft touch sensation is symmetric on the face, arms, and legs.  Coordination: The patient has good finger-nose-finger and heel-to-shin bilaterally.  Gait and station: The patient has a normal gait.  The patient has good arm swing, good turns.  Tandem gait is unsteady. Romberg is negative. No drift is seen.  Reflexes: Deep tendon reflexes are symmetric.   Assessment/Plan:  1.  Parkinson's disease  2.  Sleep apnea on CPAP  3.  Excessive daytime drowsiness  The patient no longer operates a motor vehicle as he is afraid he will fall asleep while driving.  The patient will be increased on the Provigil taking 200 mg twice daily.  He follows up through pulmonology.  He has done much better with his ability to walk, the dyskinesias have improved with the lower dose of the Sinemet.  The patient will follow-up in 6 months.  I have encouraged him to remain active, he needs to have a regular exercise program.  Jill Alexanders MD 02/05/2018 12:18 PM  [facility] [address] [place], [place] [address]  Phone [phone] Fax [phone]

## 2018-02-08 ENCOUNTER — Other Ambulatory Visit: Payer: Self-pay | Admitting: Cardiovascular Disease

## 2018-02-08 NOTE — Telephone Encounter (Signed)
Refill Request.  

## 2018-02-19 ENCOUNTER — Other Ambulatory Visit: Payer: Self-pay | Admitting: Cardiovascular Disease

## 2018-02-21 ENCOUNTER — Telehealth: Payer: Self-pay | Admitting: Cardiovascular Disease

## 2018-02-21 NOTE — Telephone Encounter (Signed)
Patient wife calling stating patient has gained some weight  Pt c/o swelling: STAT is pt has developed SOB within 24 hours  1) How much weight have you gained and in what time span? Over a week patient gained 3 pounds   2) If swelling, where is the swelling located? No swelling   3) Are you currently taking a fluid pill? yes  4) Are you currently SOB? A little   5) Do you have a log of your daily weights (if so, list)?   6) Have you gained 3 pounds in a day or 5 pounds in a week?   Patient normal weight 247 and now he is 250   7) Have you traveled recently? No

## 2018-02-21 NOTE — Telephone Encounter (Signed)
Spoke with patients wife per release form and reviewed that his last weight that I see he was 250. She denies any swelling to his ankles and that he does not have any problems breathing at this time. Instructed her to have him continue monitoring daily weights and to please give [place] a call back if he should have any further increases in weight, swelling, or if he develops any shortness of breath. She did confirm his appointment information and had no further questions at this time.

## 2018-02-24 DIAGNOSIS — G4733 Obstructive sleep apnea (adult) (pediatric): Secondary | ICD-10-CM | POA: Diagnosis not present

## 2018-02-24 DIAGNOSIS — J45909 Unspecified asthma, uncomplicated: Secondary | ICD-10-CM | POA: Diagnosis not present

## 2018-02-24 DIAGNOSIS — J449 Chronic obstructive pulmonary disease, unspecified: Secondary | ICD-10-CM | POA: Diagnosis not present

## 2018-02-24 DIAGNOSIS — J441 Chronic obstructive pulmonary disease with (acute) exacerbation: Secondary | ICD-10-CM | POA: Diagnosis not present

## 2018-02-26 ENCOUNTER — Other Ambulatory Visit: Payer: Self-pay | Admitting: Internal Medicine

## 2018-02-26 ENCOUNTER — Telehealth: Payer: Self-pay | Admitting: Neurology

## 2018-02-26 DIAGNOSIS — D696 Thrombocytopenia, unspecified: Secondary | ICD-10-CM

## 2018-02-26 NOTE — Telephone Encounter (Signed)
Pts wife [name] on [place] requesting a call, stating the pt has recently been seeing things that aren't there or say things that doesn't make sense

## 2018-02-26 NOTE — Telephone Encounter (Signed)
The Provigil was recently increased to a 200 mg twice daily dosing.  Since that time, he has had some visual hallucinations and he will have episodes where he thinks someone has called and they have not.  This generally occurs in the late afternoon, the patient goes to bed around 9 PM.  The episodes are not upsetting to the patient, but he gets a little bit nervous about it.  We will go down on the Provigil taking 200 mg in the morning, if the episodes do not improve in the next 2 weeks, we may add low-dose Seroquel.  There have been no recent changes in his Sinemet dosing.

## 2018-02-26 NOTE — Telephone Encounter (Signed)
Spoke with [name]. She sts. over the last wk, pt. is having visual hallucinations. For ex., seeing people in his home that aren't there. She denies any signs of URI, UTI. Denies new med changes in the last wk. Sts. pt. has been more awake since Modafinil was increased on 02/05/18--sts. pt. now going to bed around 9pm and sleeping, with CPAP as rx'd, until 7 or 8 in the am, then is up and more awake during he day. Sts. he is drinking more ETOH--"4 or 5 shots of Tequilla a day."  We discussed that liver function tends to decline with age, and that medication byproducts and ETOH can further impair liver function, causing medications and ETOH to affect him more and for a longer period of time than they would a person whose liver was working perfectly. She verbalized understanding of same, will try to decrease pt's ETOH use. Will let Dr. Jannifer Franklin know of this phone call. I let [name] know someone will call her back to let her know if Dr. Jannifer Franklin wants to change his treatment plan, other than decreasing ETOH use.  She verbalized understanding of same/fim

## 2018-03-02 ENCOUNTER — Inpatient Hospital Stay: Payer: PPO

## 2018-03-02 ENCOUNTER — Other Ambulatory Visit: Payer: Self-pay

## 2018-03-02 ENCOUNTER — Inpatient Hospital Stay: Payer: PPO | Attending: Internal Medicine | Admitting: Internal Medicine

## 2018-03-02 VITALS — BP 129/65 | HR 80 | Temp 98.7°F | Wt 252.0 lb

## 2018-03-02 DIAGNOSIS — Z7901 Long term (current) use of anticoagulants: Secondary | ICD-10-CM | POA: Insufficient documentation

## 2018-03-02 DIAGNOSIS — I480 Paroxysmal atrial fibrillation: Secondary | ICD-10-CM | POA: Insufficient documentation

## 2018-03-02 DIAGNOSIS — D751 Secondary polycythemia: Secondary | ICD-10-CM

## 2018-03-02 DIAGNOSIS — D696 Thrombocytopenia, unspecified: Secondary | ICD-10-CM | POA: Insufficient documentation

## 2018-03-02 DIAGNOSIS — I2510 Atherosclerotic heart disease of native coronary artery without angina pectoris: Secondary | ICD-10-CM | POA: Diagnosis not present

## 2018-03-02 DIAGNOSIS — Z79899 Other long term (current) drug therapy: Secondary | ICD-10-CM | POA: Diagnosis not present

## 2018-03-02 LAB — COMPREHENSIVE METABOLIC PANEL
ALT: 5 U/L (ref 0–44)
AST: 16 U/L (ref 15–41)
Albumin: 3.8 g/dL (ref 3.5–5.0)
Alkaline Phosphatase: 92 U/L (ref 38–126)
Anion gap: 10 (ref 5–15)
BUN: 22 mg/dL (ref 8–23)
CO2: 27 mmol/L (ref 22–32)
Calcium: 9 mg/dL (ref 8.9–10.3)
Chloride: 101 mmol/L (ref 98–111)
Creatinine, Ser: 1.06 mg/dL (ref 0.61–1.24)
GFR calc Af Amer: >60 mL/min (ref >60)
GFR calc non Af Amer: >60 mL/min (ref >60)
Glucose, Bld: 116 mg/dL — ABNORMAL HIGH (ref 70–99)
Potassium: 4.1 mmol/L (ref 3.5–5.1)
Sodium: 138 mmol/L (ref 135–145)
Total Bilirubin: 0.8 mg/dL (ref 0.3–1.2)
Total Protein: 6.8 g/dL (ref 6.5–8.1)

## 2018-03-02 LAB — CBC WITH DIFFERENTIAL/PLATELET
Abs Immature Granulocytes: 0.03 10*3/uL (ref 0.00–0.07)
Basophils Absolute: 0 10*3/uL (ref 0.0–0.1)
Basophils Relative: 1 %
Eosinophils Absolute: 0.1 10*3/uL (ref 0.0–0.5)
Eosinophils Relative: 1 %
HCT: 44 % (ref 39.0–52.0)
Hemoglobin: 13.9 g/dL (ref 13.0–17.0)
Immature Granulocytes: 0 %
Lymphocytes Relative: 23 %
Lymphs Abs: 2 10*3/uL (ref 0.7–4.0)
MCH: 28.7 pg (ref 26.0–34.0)
MCHC: 31.6 g/dL (ref 30.0–36.0)
MCV: 90.9 fL (ref 80.0–100.0)
Monocytes Absolute: 0.8 10*3/uL (ref 0.1–1.0)
Monocytes Relative: 9 %
Neutro Abs: 5.9 10*3/uL (ref 1.7–7.7)
Neutrophils Relative %: 66 %
Platelets: 126 10*3/uL — ABNORMAL LOW (ref 150–400)
RBC: 4.84 MIL/uL (ref 4.22–5.81)
RDW: 13.5 % (ref 11.5–15.5)
WBC: 8.8 10*3/uL (ref 4.0–10.5)
nRBC: 0 % (ref 0.0–0.2)

## 2018-03-02 NOTE — Progress Notes (Signed)
Patient stated that he has some dyzziness when he gets up from laying or sitting for a while. Patient also stated that he has a ring noise on his left ear.

## 2018-03-02 NOTE — Progress Notes (Signed)
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 ? Alcohol/fatty liver/? [April 2016];2016- CT/US- NED  # PE [Feb 2011 s/p chole; [facility] in [place], [place].] currently off Coumadin; April 2017- Afib- Start eliquis [Dr.Arida]; Parkinsons [[id number]]; chronic CHF.  INTERVAL HISTORY:  82 year old male patient with above history of secondary erythrocytosis from testosterone therapy/CPAP and history of A. fib on Elquis here for follow-up.  Patient continues to complain of shoulder pain for which he has been drinking alcohol on a daily basis.  He states that he does not want to be on narcotic pain  Prescription.  Denies any chest pain or shortness of breath or cough.  Denies any strokes.  No falls.   Review of Systems  Constitutional: Negative for chills, diaphoresis, fever, malaise/fatigue and weight loss.  HENT: Negative for nosebleeds and sore throat.   Eyes: Negative for double vision.  Respiratory: Negative for cough, hemoptysis, sputum production, shortness of breath and wheezing.   Cardiovascular: Negative for chest pain, palpitations, orthopnea and leg swelling.  Gastrointestinal: Negative for abdominal pain, blood in stool, constipation, diarrhea, heartburn, melena, nausea and vomiting.  Genitourinary: Negative for dysuria, frequency and urgency.  Musculoskeletal: Negative for back pain and joint pain.  Skin: Negative.  Negative for itching and rash.  Neurological: Negative for dizziness, tingling, focal weakness, weakness and headaches.  Endo/Heme/Allergies: Does not bruise/bleed easily.  Psychiatric/Behavioral: Negative for depression. The patient  is not nervous/anxious and does not have insomnia.      PAST MEDICAL HISTORY :  Past Medical History:  Diagnosis Date  . Cervical spondylosis 10/01/2013  . Chronic diastolic CHF (congestive heart failure) ([place])    a. 07/2016 Echo: >55%; b. 10/2016 Echo: EF 55-60%, Gr1 DD, Ao sclerosis w/o stenosis, sev dil LA; c. 08/2017 Echo: EF 60-65%, no rwma, Gr2 DD, mild AS, sev dil LA/RA.  [name] Coronary artery disease    a. 1998 s/p mini-cabg @ [facility] - LIMA->LAD;  b. 07/2016 St Echo: Inadequate HR w/ HTN response;  c.  08/2016 MV: EF 67%, no ischemia; d. 10/2016 NSTEMI/Cath: RCA 95p (4.0x26 Onyx DES), LIMA->LAD nl; e. 09/2017 Cath: LM 40/30, LAD 100ost, RI 80, LCX nl, OM2/3 nl, RCA patent stent, 1m, LIMA->LAD nl-->Med Rx.  . Depression   . GERD (gastroesophageal reflux disease)   . Hyperlipidemia   . Hypertension   . PAF (paroxysmal atrial fibrillation) ([facility])    a. s/p DCCV-->maintaining sinus on amiodarone;  b. CHA2DS2VASc = 5-->eliquis.  . Parkinson's disease ([place])    tremors  . Pleural effusion, right    a. 09/2017 s/p thoracentesis.  . Pulmonary embolism ([place]) 2011  . Pulmonary fibrosis ([place])   . 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 Sleep apnea    wears CPAP    PAST SURGICAL HISTORY :   Past Surgical History:  Procedure Laterality Date  . BACK SURGERY  1960  . CARDIAC CATHETERIZATION    . CHOLECYSTECTOMY  2010  . CORONARY ARTERY BYPASS GRAFT  01/07/1997  . CORONARY STENT INTERVENTION N/A 10/31/2016   Procedure: Coronary Stent Intervention;  Surgeon: Wellington Hampshire, MD;  Location: [place] [facility] [facility];  Service: Cardiovascular;  Laterality: N/A;  . ELECTROPHYSIOLOGIC STUDY N/A 07/14/2015   Procedure: CARDIOVERSION;  Surgeon:  Yolonda Kida, MD;  Location: [facility] OR[facility];  Service: Cardiovascular;  Laterality: N/A;  . ELECTROPHYSIOLOGIC STUDY N/A 10/12/2015   Procedure: CARDIOVERSION;  Surgeon: Minna Merritts, MD;  Location: [facility] OR[facility];  Service: Cardiovascular;  Laterality: N/A;  . LEFT HEART  CATH AND CORONARY ANGIOGRAPHY N/A 10/31/2016   Procedure: Left Heart Cath and Coronary Angiography;  Surgeon: Wellington Hampshire, MD;  Location: [place] [facility];  Service: Cardiovascular;  Laterality: N/A;  . OTHER SURGICAL HISTORY  1998   Bypass  . RIGHT/LEFT HEART CATH AND CORONARY ANGIOGRAPHY N/A 09/18/2017   Procedure: RIGHT/LEFT HEART CATH AND CORONARY ANGIOGRAPHY;  Surgeon: Wellington Hampshire, MD;  Location: [place] [facility];  Service: Cardiovascular;  Laterality: N/A;    FAMILY HISTORY :   Family History  Problem Relation Age of Onset  . Alcohol abuse Father     SOCIAL HISTORY:   Social History   Tobacco Use  . Smoking status: Former Smoker    Packs/day: 1.00    Years: 40.00    Pack years: 40.00    Types: Cigarettes, Pipe, Cigars    Last attempt to quit: 04/18/1972    Years since quitting: 45.9  . Smokeless tobacco: Former Systems developer    Types: Chew    Quit date: 04/18/1972  Substance Use Topics  . Alcohol use: Yes    Alcohol/week: 4.0 standard drinks    Types: 2 Cans of beer, 2 Shots of liquor per week    Comment: per 2 weeks   . Drug use: No    ALLERGIES:  is allergic to pravastatin and prednisone.  MEDICATIONS:  Current Outpatient Medications  Medication Sig Dispense Refill  . albuterol (PROVENTIL) (2.5 MG/3ML) 0.083% nebulizer solution Take 3 mLs (2.5 mg total) by nebulization every 4 (four) hours as needed for wheezing or shortness of breath. (Patient taking differently: Take 2.5 mg by nebulization as needed for wheezing or shortness of breath. ) 75 mL 12  . carbidopa-levodopa (SINEMET) 25-250 MG tablet Take 1.5 tablets by mouth 3 (three) times daily. 540 tablet 3  . carvedilol (COREG) 3.125 MG tablet Take 2 tablets (6.25 mg total) by mouth 2 (two) times daily with a meal. 120 tablet 3  . cyclobenzaprine (FLEXERIL) 5 MG tablet Take 5 mg by mouth daily as needed for muscle spasms.     [name] ELIQUIS 5 MG TABS tablet TAKE ONE TABLET BY MOUTH TWICE DAILY 60 tablet 5  .  entacapone (COMTAN) 200 MG tablet Take 1 tablet (200 mg total) by mouth 3 (three) times daily. 270 tablet 3  . EPINEPHrine 0.3 mg/0.3 mL IJ SOAJ injection Inject 0.3 mg as directed once as needed (allergic reaction). Reported on 10/27/2015    . escitalopram (LEXAPRO) 10 MG tablet Take 1 tablet (10 mg total) by mouth daily. 90 tablet 1  . esomeprazole (NEXIUM) 20 MG capsule Take 40 mg by mouth daily at 12 noon.    . fluticasone (FLONASE) 50 MCG/ACT nasal spray TAKE 2 PUFFS IN EACH NOSTRIL EVERY DAY (Patient taking differently: TAKE 2 PUFFS IN EACH NOSTRIL EVERY DAY, takes as prn) 16 g 3  . furosemide (LASIX) 20 MG tablet TAKE TWO TABLETS BY MOUTH TWICE DAILY 120 tablet 3  . HYDROcodone-acetaminophen (NORCO/VICODIN) 5-325 MG tablet Take by mouth.    . isosorbide mononitrate (IMDUR) 30 MG 24 hr tablet Take 1 tablet (30 mg total) by mouth daily. 30 tablet 1  . ketoconazole (NIZORAL) 2 % cream     . levothyroxine (SYNTHROID, LEVOTHROID)  50 MCG tablet TAKE 1 TABLET BY MOUTH ONCE DAILY. TAKE ON EMPTY STOMACH WITH A GLASSOF WATER AT LEAST 30-60 MINUTES BEFORE BREAKFAST 90 tablet 1  . losartan (COZAAR) 25 MG tablet Take 1 tablet (25 mg total) by mouth daily. 30 tablet 1  . mirabegron ER (MYRBETRIQ) 50 MG TB24 tablet Take 50 mg by mouth daily.    . modafinil (PROVIGIL) 200 MG tablet Take 1 tablet (200 mg total) by mouth 2 (two) times daily. 180 tablet 1  . mometasone (ELOCON) 0.1 % cream     . Polyethyl Glycol-Propyl Glycol (SYSTANE OP) Place 1 drop into both eyes 2 (two) times daily as needed (dry eyes).    . potassium chloride SA (K-DUR,KLOR-CON) 20 MEQ tablet Take 1 tablet (20 mEq total) by mouth daily. 90 tablet 3  . rosuvastatin (CRESTOR) 10 MG tablet Take 10 mg by mouth at bedtime.    . VENTOLIN HFA 108 (90 Base) MCG/ACT inhaler TAKE 2 PUFFS EVERY 8 HOURS AS NEEDED FORWHEEZING 18 g 3   No current facility-administered medications for this visit.     PHYSICAL EXAMINATION:   BP 129/65 (BP Location:  Left Arm, Patient Position: Sitting)   Pulse 80   Temp 98.7 F (37.1 C) (Oral)   Wt 252 lb (114.3 kg)   BMI 39.47 kg/m   Filed Weights   03/02/18 1410  Weight: 252 lb (114.3 kg)    Physical Exam  Constitutional: He is oriented to person, place, and time and well-developed, well-nourished, and in no distress.  Accompanied by his wife.  HENT:  Head: Normocephalic and atraumatic.  Mouth/Throat: Oropharynx is clear and moist. No oropharyngeal exudate.  Eyes: Pupils are equal, round, and reactive to light.  Neck: Normal range of motion. Neck supple.  Cardiovascular: Normal rate and regular rhythm.  Pulmonary/Chest: No respiratory distress. He has no wheezes.  Abdominal: Soft. Bowel sounds are normal. He exhibits no distension and no mass. There is no tenderness. There is no rebound and no guarding.  Musculoskeletal: Normal range of motion. He exhibits no edema or tenderness.  Neurological: He is alert and oriented to person, place, and time.  Skin: Skin is warm.  Psychiatric: Affect normal.     LABORATORY DATA:  I have reviewed the data as listed    Component Value Date/Time   NA 138 03/02/2018 1335   NA 139 10/09/2017 1441   K 4.1 03/02/2018 1335   CL 101 03/02/2018 1335   CO2 27 03/02/2018 1335   GLUCOSE 116 (H) 03/02/2018 1335   BUN 22 03/02/2018 1335   BUN 23 10/09/2017 1441   CREATININE 1.06 03/02/2018 1335   CREATININE 0.95 12/06/2011 1554   CALCIUM 9.0 03/02/2018 1335   PROT 6.8 03/02/2018 1335   PROT 7.0 12/03/2015 0842   PROT 7.6 09/20/2011 1529   ALBUMIN 3.8 03/02/2018 1335   ALBUMIN 4.2 12/03/2015 0842   ALBUMIN 3.7 09/20/2011 1529   AST 16 03/02/2018 1335   AST 18 09/20/2011 1529   ALT 5 03/02/2018 1335   ALT 23 09/20/2011 1529   ALKPHOS 92 03/02/2018 1335   ALKPHOS 133 09/20/2011 1529   BILITOT 0.8 03/02/2018 1335   BILITOT 0.8 12/03/2015 0842   BILITOT 0.5 09/20/2011 1529   GFRNONAA >60 03/02/2018 1335   GFRNONAA >60 12/06/2011 1554   GFRAA >60  03/02/2018 1335   GFRAA >60 12/06/2011 1554    No results found for: SPEP, UPEP  Lab Results  Component Value Date   WBC 8.8 03/02/2018  NEUTROABS 5.9 03/02/2018   HGB 13.9 03/02/2018   HCT 44.0 03/02/2018   MCV 90.9 03/02/2018   PLT 126 (L) 03/02/2018      Chemistry      Component Value Date/Time   NA 138 03/02/2018 1335   NA 139 10/09/2017 1441   K 4.1 03/02/2018 1335   CL 101 03/02/2018 1335   CO2 27 03/02/2018 1335   BUN 22 03/02/2018 1335   BUN 23 10/09/2017 1441   CREATININE 1.06 03/02/2018 1335   CREATININE 0.95 12/06/2011 1554      Component Value Date/Time   CALCIUM 9.0 03/02/2018 1335   ALKPHOS 92 03/02/2018 1335   ALKPHOS 133 09/20/2011 1529   AST 16 03/02/2018 1335   AST 18 09/20/2011 1529   ALT 5 03/02/2018 1335   ALT 23 09/20/2011 1529   BILITOT 0.8 03/02/2018 1335   BILITOT 0.8 12/03/2015 0842   BILITOT 0.5 09/20/2011 1529        ASSESSMENT & PLAN:   Erythrocytosis # 2011- 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  # Today hemoglobin is 13; HCT -43; HOLD phlebotomy.   # Mild thrombocytopenia platelets 126;   question secondary to alcohol versus liver disease.vs ITP. Monitor for now.   # Chronic neck pain/ joint pain- on alcohol; STABLE; recommend moderation.   # CAD/-stenting [Dr.Arida] A. Fib- on Eliquis per cardiology/ see above.   # DISPOSITION: # NO Phlebotomy today # Follow up in  6 months-MD labs- cbc/cmp- Dr.B  Cc; Dr.Arida/[place]     Cammie Sickle, MD 03/05/2018 7:32 AM

## 2018-03-02 NOTE — Assessment & Plan Note (Addendum)
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  # Today hemoglobin is 13; HCT -43; HOLD phlebotomy.   # Mild thrombocytopenia platelets 126;   question secondary to alcohol versus liver disease.vs ITP. Monitor for now.   # Chronic neck pain/ joint pain- on alcohol; STABLE; recommend moderation.   # CAD/-stenting [Dr.Arida] A. Fib- on Eliquis per cardiology/ see above.   # DISPOSITION: # NO Phlebotomy today # Follow up in  6 months-MD labs- cbc/cmp- Dr.B  Cc; Dr.Arida/Sonnenberg

## 2018-03-06 DIAGNOSIS — J449 Chronic obstructive pulmonary disease, unspecified: Secondary | ICD-10-CM | POA: Diagnosis not present

## 2018-03-06 DIAGNOSIS — G4733 Obstructive sleep apnea (adult) (pediatric): Secondary | ICD-10-CM | POA: Diagnosis not present

## 2018-03-06 DIAGNOSIS — J849 Interstitial pulmonary disease, unspecified: Secondary | ICD-10-CM | POA: Diagnosis not present

## 2018-03-06 DIAGNOSIS — J441 Chronic obstructive pulmonary disease with (acute) exacerbation: Secondary | ICD-10-CM | POA: Diagnosis not present

## 2018-03-06 DIAGNOSIS — J45909 Unspecified asthma, uncomplicated: Secondary | ICD-10-CM | POA: Diagnosis not present

## 2018-03-07 ENCOUNTER — Other Ambulatory Visit: Payer: Self-pay | Admitting: Family Medicine

## 2018-03-07 DIAGNOSIS — M47812 Spondylosis without myelopathy or radiculopathy, cervical region: Secondary | ICD-10-CM | POA: Diagnosis not present

## 2018-03-09 ENCOUNTER — Encounter: Payer: Self-pay | Admitting: Family Medicine

## 2018-03-09 ENCOUNTER — Ambulatory Visit (INDEPENDENT_AMBULATORY_CARE_PROVIDER_SITE_OTHER): Payer: PPO | Admitting: Family Medicine

## 2018-03-09 VITALS — BP 112/66 | HR 67 | Temp 97.4°F | Ht 67.0 in | Wt 254.4 lb

## 2018-03-09 DIAGNOSIS — M542 Cervicalgia: Secondary | ICD-10-CM

## 2018-03-09 DIAGNOSIS — M791 Myalgia, unspecified site: Secondary | ICD-10-CM

## 2018-03-09 DIAGNOSIS — F329 Major depressive disorder, single episode, unspecified: Secondary | ICD-10-CM | POA: Diagnosis not present

## 2018-03-09 DIAGNOSIS — R310 Gross hematuria: Secondary | ICD-10-CM | POA: Diagnosis not present

## 2018-03-09 DIAGNOSIS — R0600 Dyspnea, unspecified: Secondary | ICD-10-CM

## 2018-03-09 DIAGNOSIS — H9193 Unspecified hearing loss, bilateral: Secondary | ICD-10-CM | POA: Diagnosis not present

## 2018-03-09 DIAGNOSIS — F32A Depression, unspecified: Secondary | ICD-10-CM

## 2018-03-09 DIAGNOSIS — R319 Hematuria, unspecified: Secondary | ICD-10-CM | POA: Insufficient documentation

## 2018-03-09 DIAGNOSIS — F419 Anxiety disorder, unspecified: Secondary | ICD-10-CM | POA: Diagnosis not present

## 2018-03-09 LAB — CK: Total CK: 54 U/L (ref 7–232)

## 2018-03-09 NOTE — Assessment & Plan Note (Signed)
Chronic issue.  Likely multifactorial.  Suspect deconditioning and weight is playing a role.  Discussed adding short duration exercise with walking to help build up stamina.  He will continue to see pulmonology as his pulmonary issues are playing a role as well.

## 2018-03-09 NOTE — Assessment & Plan Note (Signed)
Currently well controlled.  Will continue Lexapro.

## 2018-03-09 NOTE — Assessment & Plan Note (Signed)
Thigh discomfort.  Suspect possible IT band syndrome.  We will check a CK.  Given exercises to complete.

## 2018-03-09 NOTE — Patient Instructions (Addendum)
Nice to see you. Please do the exercises for your legs. Please continue to see pulmonology and your orthopedist. We will get lab work and contact her with the results.   Iliotibial Band Syndrome Rehab Ask your health care provider which exercises are safe for you. Do exercises exactly as told by your health care provider and adjust them as directed. It is normal to feel mild stretching, pulling, tightness, or discomfort as you do these exercises, but you should stop right away if you feel sudden pain or your pain gets worse.Do not begin these exercises until told by your health care provider. Stretching and range of motion exercises These exercises warm up your muscles and joints and improve the movement and flexibility of your hip and pelvis. Exercise A: Quadriceps, prone  1. Lie on your abdomen on a firm surface, such as a bed or padded floor. 2. Bend your left / right knee and hold your ankle. If you cannot reach your ankle or pant leg, loop a belt around your foot and grab the belt instead. 3. Gently pull your heel toward your buttocks. Your knee should not slide out to the side. You should feel a stretch in the front of your thigh and knee. 4. Hold this position for __________ seconds. Repeat __________ times. Complete this stretch __________ times a day. Exercise B: Iliotibial band  1. Lie on your side with your left / right leg in the top position. 2. Bend both of your knees and grab your left / right ankle. Stretch out your bottom arm to help you balance. 3. Slowly bring your top knee back so your thigh goes behind your trunk. 4. Slowly lower your top leg toward the floor until you feel a gentle stretch on the outside of your left / right hip and thigh. If you do not feel a stretch and your knee will not fall farther, place the heel of your other foot on top of your knee and pull your knee down toward the floor with your foot. 5. Hold this position for __________ seconds. Repeat  __________ times. Complete this stretch __________ times a day. Strengthening exercises These exercises build strength and endurance in your hip and pelvis. Endurance is the ability to use your muscles for a long time, even after they get tired. Exercise C: Straight leg raises ( hip abductors) 1. Lie on your side with your left / right leg in the top position. Lie so your head, shoulder, knee, and hip line up. You may bend your bottom knee to help you balance. 2. Roll your hips slightly forward so your hips are stacked directly over each other and your left / right knee is facing forward. 3. Tense the muscles in your outer thigh and lift your top leg 4-6 inches (10-15 cm). 4. Hold this position for __________ seconds. 5. Slowly return to the starting position. Let your muscles relax completely before doing another repetition. Repeat __________ times. Complete this exercise __________ times a day. Exercise D: Straight leg raises ( hip extensors) 1. Lie on your abdomen on your bed or a firm surface. You can put a pillow under your hips if that is more comfortable. 2. Bend your left / right knee so your foot is straight up in the air. 3. Squeeze your buttock muscles and lift your left / right thigh off the bed. Do not let your back arch. 4. Tense this muscle as hard as you can without increasing any knee pain. 5. Hold this  position for __________ seconds. 6. Slowly lower your leg to the starting position and allow it to relax completely. Repeat __________ times. Complete this exercise __________ times a day. Exercise E: Hip hike 1. Stand sideways on a bottom step. Stand on your left / right leg with your other foot unsupported next to the step. You can hold onto the railing or wall if needed for balance. 2. Keep your knees straight and your torso square. Then, lift your left / right hip up toward the ceiling. 3. Slowly let your left / right hip lower toward the floor, past the starting position.  Your foot should get closer to the floor. Do not lean or bend your knees. Repeat __________ times. Complete this exercise __________ times a day. This information is not intended to replace advice given to you by your health care provider. Make sure you discuss any questions you have with your health care provider. Document Released: 04/04/2005 Document Revised: 12/08/2015 Document Reviewed: 03/06/2015 Elsevier Interactive Patient Education  [employer].

## 2018-03-09 NOTE — Assessment & Plan Note (Signed)
Patient will continue to see orthopedics.

## 2018-03-09 NOTE — Assessment & Plan Note (Signed)
Resolved.  He underwent evaluation through urology.

## 2018-03-09 NOTE — Assessment & Plan Note (Signed)
Patient has been evaluated by ENT.  He is considering hearing aids.

## 2018-03-09 NOTE — Progress Notes (Signed)
[name], MD Phone: [phone]  Andre [name] is a 82 y.o. male who presents today for f/u.  CC: DOE, hematuria, decreased hearing, anxiety, thigh pain, neck pain  Dyspnea on exertion: Patient saw pulmonology at [facility].  They felt as though a lot of his symptoms were related to deconditioning and recommended lifestyle changes.  There is also likely mesothelioma for which they are going to repeat a CT scan in January.  He notes his breathing is a little bit better.  He has been able to walk a little further.  Hematuria: He saw urology after having gross hematuria.  He had a CT scan which did not reveal a cause.  He also reports he had a cystoscopy that did not reveal a cause.  Decreased hearing: He saw ENT for this.  They recommended that he have a hearing aid.  He notes he saw them about a month ago.  Anxiety: He notes this is well controlled.  Currently on Lexapro.  No depression.  Neck pain: He has been seeing orthopedics at [facility].  They have done injections in his neck and they are trying to figure out where the exact cause is so that they can do nerve ablation.  He notes the injections do help with the pain.  He notes no radiation.  Notes his neck hurts more to turn to the left into the right.  Thigh pain: Patient notes bilateral thigh discomfort that occurs laterally only when he sitting down at night.  This has been going on for about a month or so.  No injury.  No pain currently.  Social History   Tobacco Use  Smoking Status Former Smoker  . Packs/day: 1.00  . Years: 40.00  . Pack years: 40.00  . Types: Cigarettes, Pipe, Cigars  . Last attempt to quit: 04/18/1972  . Years since quitting: 45.9  Smokeless Tobacco Former Systems developer  . Types: Chew  . Quit date: 04/18/1972     ROS see history of present illness  Objective  Physical Exam Vitals:   03/09/18 1053  BP: 112/66  Pulse: 67  Temp: (!) 97.4 F (36.3 C)  SpO2: 95%    BP Readings from Last 3 Encounters:    03/09/18 112/66  03/02/18 129/65  02/05/18 (!) 165/81   Wt Readings from Last 3 Encounters:  03/09/18 254 lb 6.4 oz (115.4 kg)  03/02/18 252 lb (114.3 kg)  02/05/18 250 lb (113.4 kg)    Physical Exam  Constitutional: No distress.  Cardiovascular: Normal rate, regular rhythm and normal heart sounds.  Pulmonary/Chest: Effort normal and breath sounds normal.  Musculoskeletal: He exhibits no edema.  No midline neck tenderness, no midline neck step-off, no muscular neck tenderness, no thigh tenderness or palpable defects, no knee tenderness  Neurological: He is alert.  5/5 strength in bilateral biceps, triceps, grip, quads, hamstrings, plantar and dorsiflexion, sensation to light touch intact in bilateral UE and LE, normal gait  Skin: Skin is warm and dry. He is not diaphoretic.     Assessment/Plan: Please see individual problem list.  Dyspnea Chronic issue.  Likely multifactorial.  Suspect deconditioning and weight is playing a role.  Discussed adding short duration exercise with walking to help build up stamina.  He will continue to see pulmonology as his pulmonary issues are playing a role as well.  Anxiety and depression Currently well controlled.  Will continue Lexapro.  Hematuria Resolved.  He underwent evaluation through urology.  Neck pain Patient will continue to see orthopedics.  Myalgia Thigh discomfort.  Suspect possible IT band syndrome.  We will check a CK.  Given exercises to complete.  Decreased hearing of both ears Patient has been evaluated by ENT.  He is considering hearing aids.   Orders Placed This Encounter  Procedures  . CK (Creatine Kinase)    No orders of the defined types were placed in this encounter.    [name], MD [place]

## 2018-03-12 ENCOUNTER — Other Ambulatory Visit: Payer: Self-pay | Admitting: Neurology

## 2018-03-13 ENCOUNTER — Other Ambulatory Visit: Payer: Self-pay | Admitting: Neurology

## 2018-03-22 ENCOUNTER — Encounter: Payer: Self-pay | Admitting: Cardiovascular Disease

## 2018-03-22 ENCOUNTER — Ambulatory Visit: Payer: PPO | Admitting: Cardiovascular Disease

## 2018-03-22 VITALS — BP 146/80 | HR 60 | Resp 95 | Ht 67.0 in | Wt 257.0 lb

## 2018-03-22 DIAGNOSIS — I25118 Atherosclerotic heart disease of native coronary artery with other forms of angina pectoris: Secondary | ICD-10-CM | POA: Diagnosis not present

## 2018-03-22 DIAGNOSIS — E785 Hyperlipidemia, unspecified: Secondary | ICD-10-CM

## 2018-03-22 DIAGNOSIS — I10 Essential (primary) hypertension: Secondary | ICD-10-CM | POA: Diagnosis not present

## 2018-03-22 DIAGNOSIS — I4891 Unspecified atrial fibrillation: Secondary | ICD-10-CM

## 2018-03-22 DIAGNOSIS — I5032 Chronic diastolic (congestive) heart failure: Secondary | ICD-10-CM

## 2018-03-22 NOTE — Progress Notes (Signed)
Cardiology Office Note   Date:  03/22/2018   ID:  Andre [name], DOB Jun 20, 1935, MRN [id number]  PCP:  [name], MD  Cardiologist:   [name], MD   Chief Complaint  Patient presents with  . other    SOB, wt gain and 3 mo follow up. Medications reviewed verbally.       History of Present Illness: Andre [name] is a 82 y.o. male who presents for a follow-up visit regarding coronary artery disease.   He has known history of coronary artery disease status post 1 vessel CABG with LIMA to LAD in 1998, hypertension, hyperlipidemia, chronic diastolic heart aure, paroxysmal atrial fibrillation and morbid obesity. He also has history of pulmonary embolism and Parkinson's He was hospitalized in July of 2018 with a small non-ST elevation myocardial infarction. Echocardiogram showed normal LV systolic function with severely dilated left atrium. Cardiac catheterization showed occluded native LAD with patent LIMA, severe proximal RCA stenosis with heavy thrombus and moderate distal left main stenosis with significant ostial stenosis in the ramus branch. EF was 50-55% with mildly elevated left ventricular end-diastolic pressure. I performed successful angioplasty and drug-eluting stent placement to the right coronary artery which was complicated by embolization into a large RV branch at the lesion site. This was treated with balloon angioplasty which established TIMI 1 flow.  He had worsening shortness of breath recently thought to be multifactorial due to chronic diastolic heart failure, lung disease, physical deconditioning and possible allergies.    Pulmonary function testing was suggestive of restrictive physiology and CT scan showed early pulmonary fibrosis and COPD.  Due to these changes, we elected to discontinue amiodarone.    He continues to have significant exertional dyspnea and thus I proceeded with a right and left cardiac catheterization in June of this year which showed  significant underlying three-vessel coronary artery disease with patent LIMA to LAD and patent RCA stent.  There was significant ostial disease and ramus branch as well as first diagonal.  Both of these were unchanged from most recent cardiac catheterization.  Right heart catheterization showed only mildly elevated filling pressures, minimal pulmonary hypertension and normal cardiac output.  Based on that, I felt that his symptoms were mostly noncardiac.  The patient has followed with [facility] and he does have changes on his CT suggestive of ILD.  Some of the findings are suggestive of asbestos exposure.  The patient has been stable overall with no recent chest pain.   Past Medical History:  Diagnosis Date  . Cervical spondylosis 10/01/2013  . Chronic diastolic CHF (congestive heart failure) ([place])    a. 07/2016 Echo: >55%; b. 10/2016 Echo: EF 55-60%, Gr1 DD, Ao sclerosis w/o stenosis, sev dil LA; c. 08/2017 Echo: EF 60-65%, no rwma, Gr2 DD, mild AS, sev dil LA/RA.  [name] Coronary artery disease    a. 1998 s/p mini-cabg @ [facility] - LIMA->LAD;  b. 07/2016 St Echo: Inadequate HR w/ HTN response;  c.  08/2016 MV: EF 67%, no ischemia; d. 10/2016 NSTEMI/Cath: RCA 95p (4.0x26 Onyx DES), LIMA->LAD nl; e. 09/2017 Cath: LM 40/30, LAD 100ost, RI 80, LCX nl, OM2/3 nl, RCA patent stent, 18m, LIMA->LAD nl-->Med Rx.  . Depression   . GERD (gastroesophageal reflux disease)   . Hyperlipidemia   . Hypertension   . PAF (paroxysmal atrial fibrillation) ([facility])    a. s/p DCCV-->maintaining sinus on amiodarone;  b. CHA2DS2VASc = 5-->eliquis.  . Parkinson's disease ([place])    tremors  .  Pleural effusion, right    a. 09/2017 s/p thoracentesis.  [name] PNA (pneumonia) 08/26/2017  . Pulmonary embolism ([place]) 2011  . Pulmonary fibrosis ([place])   . 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 Sleep apnea    wears CPAP    Past Surgical History:  Procedure Laterality Date  . BACK SURGERY  1960  . CARDIAC CATHETERIZATION    .  CHOLECYSTECTOMY  2010  . CORONARY ARTERY BYPASS GRAFT  01/07/1997  . CORONARY STENT INTERVENTION N/A 10/31/2016   Procedure: Coronary Stent Intervention;  Surgeon: Wellington Hampshire, MD;  Location: [place] [facility];  Service: Cardiovascular;  Laterality: N/A;  . ELECTROPHYSIOLOGIC STUDY N/A 07/14/2015   Procedure: CARDIOVERSION;  Surgeon: Yolonda Kida, MD;  Location: [facility];  Service: Cardiovascular;  Laterality: N/A;  . ELECTROPHYSIOLOGIC STUDY N/A 10/12/2015   Procedure: CARDIOVERSION;  Surgeon: Minna Merritts, MD;  Location: [facility];  Service: Cardiovascular;  Laterality: N/A;  . LEFT HEART CATH AND CORONARY ANGIOGRAPHY N/A 10/31/2016   Procedure: Left Heart Cath and Coronary Angiography;  Surgeon: Wellington Hampshire, MD;  Location: [place] [facility];  Service: Cardiovascular;  Laterality: N/A;  . OTHER SURGICAL HISTORY  1998   Bypass  . RIGHT/LEFT HEART CATH AND CORONARY ANGIOGRAPHY N/A 09/18/2017   Procedure: RIGHT/LEFT HEART CATH AND CORONARY ANGIOGRAPHY;  Surgeon: Wellington Hampshire, MD;  Location: [place] [facility];  Service: Cardiovascular;  Laterality: N/A;     Current Outpatient Medications  Medication Sig Dispense Refill  . albuterol (PROVENTIL) (2.5 MG/3ML) 0.083% nebulizer solution Take 3 mLs (2.5 mg total) by nebulization every 4 (four) hours as needed for wheezing or shortness of breath. (Patient taking differently: Take 2.5 mg by nebulization as needed for wheezing or shortness of breath. ) 75 mL 12  . carbidopa-levodopa (SINEMET) 25-250 MG tablet Take 1.5 tablets by mouth 3 (three) times daily. 540 tablet 3  . carvedilol (COREG) 3.125 MG tablet Take 2 tablets (6.25 mg total) by mouth 2 (two) times daily with a meal. 120 tablet 3  . cyclobenzaprine (FLEXERIL) 5 MG tablet Take 5 mg by mouth daily as needed for muscle spasms.     [name] ELIQUIS 5 MG TABS tablet TAKE ONE TABLET BY MOUTH TWICE DAILY 60 tablet 5  . entacapone (COMTAN) 200 MG tablet Take 1 tablet (200 mg  total) by mouth 3 (three) times daily. 270 tablet 3  . escitalopram (LEXAPRO) 10 MG tablet TAKE ONE TABLET BY MOUTH EVERY DAY 90 tablet 1  . esomeprazole (NEXIUM) 20 MG capsule Take 40 mg by mouth daily at 12 noon.    . fluticasone (FLONASE) 50 MCG/ACT nasal spray TAKE 2 PUFFS IN EACH NOSTRIL EVERY DAY (Patient taking differently: TAKE 2 PUFFS IN EACH NOSTRIL EVERY DAY, takes as prn) 16 g 3  . furosemide (LASIX) 20 MG tablet TAKE TWO TABLETS BY MOUTH TWICE DAILY 120 tablet 3  . HYDROcodone-acetaminophen (NORCO/VICODIN) 5-325 MG tablet Take by mouth.    . isosorbide mononitrate (IMDUR) 30 MG 24 hr tablet Take 1 tablet (30 mg total) by mouth daily. 30 tablet 1  . ketoconazole (NIZORAL) 2 % cream     . levothyroxine (SYNTHROID, LEVOTHROID) 50 MCG tablet TAKE 1 TABLET BY MOUTH ONCE DAILY. TAKE ON EMPTY STOMACH WITH A GLASSOF WATER AT LEAST 30-60 MINUTES BEFORE BREAKFAST 90 tablet 1  . losartan (COZAAR) 25 MG tablet Take 1 tablet (25 mg total) by mouth daily. 30 tablet 1  . mirabegron ER (MYRBETRIQ) 50 MG TB24 tablet Take  50 mg by mouth daily.    . modafinil (PROVIGIL) 200 MG tablet Take 1 tablet (200 mg total) by mouth 2 (two) times daily. (Patient taking differently: Take 200 mg by mouth daily. ) 180 tablet 1  . mometasone (ELOCON) 0.1 % cream     . Polyethyl Glycol-Propyl Glycol (SYSTANE OP) Place 1 drop into both eyes 2 (two) times daily as needed (dry eyes).    . potassium chloride SA (K-DUR,KLOR-CON) 20 MEQ tablet Take 1 tablet (20 mEq total) by mouth daily. 90 tablet 3  . rosuvastatin (CRESTOR) 10 MG tablet Take 10 mg by mouth at bedtime.    . VENTOLIN HFA 108 (90 Base) MCG/ACT inhaler TAKE 2 PUFFS EVERY 8 HOURS AS NEEDED FORWHEEZING 18 g 3   No current facility-administered medications for this visit.     Allergies:   Pravastatin and Prednisone    Social History:  The patient  reports that he quit smoking about 45 years ago. His smoking use included cigarettes, pipe, and cigars. He has a  40.00 pack-year smoking history. He quit smokeless tobacco use about 45 years ago.  His smokeless tobacco use included chew. He reports that he drinks about 4.0 standard drinks of alcohol per week. He reports that he does not use drugs.   Family History:  The patient's family history includes Alcohol abuse in his father.    ROS:  Please see the history of present illness.   Otherwise, review of systems are positive for none.   All other systems are reviewed and negative.    PHYSICAL EXAM: VS:  BP (!) 146/80 (BP Location: Left Arm, Patient Position: Sitting, Cuff Size: Normal)   Pulse 60   Resp (!) 95   Ht 5\' 7"  (1.702 m)   Wt 257 lb (116.6 kg)   BMI 40.25 kg/m  , BMI Body mass index is 40.25 kg/m. GEN: Well nourished, well developed, in no acute distress  HEENT: normal  Neck: no JVD, carotid bruits, or masses Cardiac: RRR; no murmurs, rubs, or gallops, trace bilateral leg edema Respiratory:  clear to auscultation bilaterally with mildly diminished breath sounds, normal work of breathing GI: soft, nontender, nondistended, + BS MS: no deformity or atrophy  Skin: warm and dry, no rash Neuro:  Strength and sensation are intact Psych: euthymic mood, full affect  EKG:  EKG is ordered today. The ekg ordered today demonstrates normal sinus rhythm with nonspecific ST and T wave changes.  Incomplete right bundle branch block.  Recent Labs: 08/11/2017: Magnesium 2.0 08/26/2017: B Natriuretic Peptide 293.0 12/21/2017: TSH 2.020 03/02/2018: ALT 5; BUN 22; Creatinine, Ser 1.06; Hemoglobin 13.9; Platelets 126; Potassium 4.1; Sodium 138    Lipid Panel    Component Value Date/Time   CHOL 146 08/27/2017 0419   TRIG 134 08/27/2017 0419   HDL 43 08/27/2017 0419   CHOLHDL 3.4 08/27/2017 0419   VLDL 27 08/27/2017 0419   LDLCALC 76 08/27/2017 0419      Wt Readings from Last 3 Encounters:  03/22/18 257 lb (116.6 kg)  03/09/18 254 lb 6.4 oz (115.4 kg)  03/02/18 252 lb (114.3 kg)       No  flowsheet data found.    ASSESSMENT AND PLAN:  1.coronary artery disease involving native coronary arteries with other forms of angina: He seems to be stable overall.  Cardiac catheterization in June was reassuring.  Continue medical therapy.  2. Atrial fibrillation: Maintaining in sinus rhythm.  Amiodarone was discontinued due to concerns about possible lung  toxicity as noted on pulmonary function testing and CT scan which was suggestive of early fibrosis.  Continue treatment with small dose carvedilol and anticoagulation with Eliquis.   3. Essential hypertension: Blood pressure is reasonably controlled.  4. Hyperlipidemia: He had myalgia with atorvastatin but he is tolerating rosuvastatin.  5.  Chronic diastolic heart failure: He appears to be euvolemic on current dose of furosemide.    Disposition:   FU with me in 6 months  Signed,  [name], MD  03/22/2018 2:46 PM    [place]

## 2018-03-22 NOTE — Patient Instructions (Signed)

## 2018-03-23 ENCOUNTER — Other Ambulatory Visit: Payer: Self-pay | Admitting: Neurology

## 2018-03-26 DIAGNOSIS — J449 Chronic obstructive pulmonary disease, unspecified: Secondary | ICD-10-CM | POA: Diagnosis not present

## 2018-03-26 DIAGNOSIS — G4733 Obstructive sleep apnea (adult) (pediatric): Secondary | ICD-10-CM | POA: Diagnosis not present

## 2018-03-26 DIAGNOSIS — J441 Chronic obstructive pulmonary disease with (acute) exacerbation: Secondary | ICD-10-CM | POA: Diagnosis not present

## 2018-03-26 DIAGNOSIS — J45909 Unspecified asthma, uncomplicated: Secondary | ICD-10-CM | POA: Diagnosis not present

## 2018-03-29 DIAGNOSIS — M47812 Spondylosis without myelopathy or radiculopathy, cervical region: Secondary | ICD-10-CM | POA: Diagnosis not present

## 2018-04-02 DIAGNOSIS — H2511 Age-related nuclear cataract, right eye: Secondary | ICD-10-CM | POA: Diagnosis not present

## 2018-04-05 ENCOUNTER — Telehealth: Payer: Self-pay | Admitting: Cardiovascular Disease

## 2018-04-05 DIAGNOSIS — J849 Interstitial pulmonary disease, unspecified: Secondary | ICD-10-CM | POA: Diagnosis not present

## 2018-04-05 DIAGNOSIS — D696 Thrombocytopenia, unspecified: Secondary | ICD-10-CM | POA: Diagnosis not present

## 2018-04-05 DIAGNOSIS — Z86010 Personal history of colonic polyps: Secondary | ICD-10-CM | POA: Diagnosis not present

## 2018-04-05 DIAGNOSIS — J441 Chronic obstructive pulmonary disease with (acute) exacerbation: Secondary | ICD-10-CM | POA: Diagnosis not present

## 2018-04-05 DIAGNOSIS — J45909 Unspecified asthma, uncomplicated: Secondary | ICD-10-CM | POA: Diagnosis not present

## 2018-04-05 DIAGNOSIS — G4733 Obstructive sleep apnea (adult) (pediatric): Secondary | ICD-10-CM | POA: Diagnosis not present

## 2018-04-05 DIAGNOSIS — J449 Chronic obstructive pulmonary disease, unspecified: Secondary | ICD-10-CM | POA: Diagnosis not present

## 2018-04-05 DIAGNOSIS — K2270 Barrett's esophagus without dysplasia: Secondary | ICD-10-CM | POA: Diagnosis not present

## 2018-04-05 NOTE — Telephone Encounter (Signed)
Last seen by Dr Fletcher Anon on 03/22/18.  Patient on Eliquis.

## 2018-04-05 NOTE — Telephone Encounter (Signed)
   [facility] Pre-operative Risk Assessment    Request for surgical clearance:  1. What type of surgery is being performed? Colonoscopy and EGD  2. When is this surgery scheduled? 06/07/2018  3. What type of clearance is required (medical clearance vs. Pharmacy clearance to hold med vs. Both)?  not listed 4. Are there any medications that need to be held prior to surgery and how long? Eliquis 5. Practice name and name of physician performing surgery? [facility]  6. What is your office phone number [phone]   7.   What is your office fax [phone]  8.   Anesthesia type (None, local, MAC, general) not listed   Andre [name] 04/05/2018, 3:50 PM  _________________________________________________________________   (provider comments below)

## 2018-04-06 NOTE — Telephone Encounter (Signed)
The patient was called and made aware to hold Eliquis 2 days prior.  Clearance successfully faxed to [phone]

## 2018-04-06 NOTE — Telephone Encounter (Signed)
Moderate risk from a cardiac standpoint.  Eliquis can be held 2 days before procedures.

## 2018-04-16 DIAGNOSIS — G4733 Obstructive sleep apnea (adult) (pediatric): Secondary | ICD-10-CM | POA: Diagnosis not present

## 2018-04-17 ENCOUNTER — Other Ambulatory Visit: Payer: Self-pay

## 2018-04-17 MED ORDER — ISOSORBIDE MONONITRATE ER 30 MG PO TB24: 30.0000 mg | ORAL_TABLET | Freq: Every day | ORAL | 3 refills | Status: DC

## 2018-04-19 ENCOUNTER — Telehealth: Payer: Self-pay | Admitting: Cardiovascular Disease

## 2018-04-19 NOTE — Telephone Encounter (Signed)
   [facility] Pre-operative Risk Assessment    Request for surgical clearance:  1. What type of surgery is being performed? Medial Branch Block  2. When is this surgery scheduled? Not listed   3. What type of clearance is required (medical clearance vs. Pharmacy clearance to hold med vs. Both)? pharmacy  4. Are there any medications that need to be held prior to surgery and how long? Eliquis, 3 days   5. Practice name and name of physician performing surgery? [facility]   6. What is your office phone number [phone]   7.   What is your office fax number [phone]  8.   Anesthesia type (None, local, MAC, general) ? Not listed   [name] 04/19/2018, 9:52 AM  _________________________________________________________________   (provider comments below)

## 2018-04-20 NOTE — Telephone Encounter (Signed)
Patient with diagnosis of afib with a PMH of a single PE in 2011 on apixaban for anticoagulation.    Procedure: Medical Branch Block Date of procedure: TBD  CHADS2-VASc score of  7 (CHF, HTN, AGE,stroke/tia x 2, CAD, AGE)  Office policy would be to hold anticoagulation for 3 days prior to procedure, however due to Trion score of  7 and hx of PE (single event in 2011) patient is at increased risk off of anticoagulation. I will defer decision to primary cardiologist

## 2018-04-20 NOTE — Telephone Encounter (Signed)
Requesting MD is Dr. Sharlet Salina at [facility] of [place].  To pre op pharmacy pool to review regarding eliquis.

## 2018-04-23 NOTE — Telephone Encounter (Signed)
His risk of stroke is too high if we hold Eliquis for 3 days.  Probably best to hold only 3 doses before the procedure.  Bleeding risk should be low given that his renal function is relatively well-preserved.

## 2018-04-24 NOTE — Telephone Encounter (Signed)
Spoke with Chanel from [facility] who informed me that they have received pre-op clearance recommendations from our office. Chanel states that requesting surgeon will review the notes and call our office back if they have any questions. Chanel thanked me for the call.

## 2018-04-24 NOTE — Telephone Encounter (Signed)
   Primary Cardiologist:  [name], MD   Please see recommendations below from our PharmD and Dr. Fletcher Anon, his primary cardiologist, regarding holding his Apixaban (Eliquis).  Please call with questions.   Call back staff: This phone note was faxed to the requesting provider. Please call to make sure it was received. This note will be removed from the preop pool.   Richardson Dopp, PA-C    04/24/2018 2:17 PM

## 2018-04-24 NOTE — Telephone Encounter (Signed)
Spoke with Chanel from [facility] who informed me that they have received pre-op clearance and they will have the requesting s

## 2018-04-26 DIAGNOSIS — G4733 Obstructive sleep apnea (adult) (pediatric): Secondary | ICD-10-CM | POA: Diagnosis not present

## 2018-04-26 DIAGNOSIS — M47812 Spondylosis without myelopathy or radiculopathy, cervical region: Secondary | ICD-10-CM | POA: Diagnosis not present

## 2018-04-26 DIAGNOSIS — J449 Chronic obstructive pulmonary disease, unspecified: Secondary | ICD-10-CM | POA: Diagnosis not present

## 2018-04-26 DIAGNOSIS — J441 Chronic obstructive pulmonary disease with (acute) exacerbation: Secondary | ICD-10-CM | POA: Diagnosis not present

## 2018-04-26 DIAGNOSIS — J45909 Unspecified asthma, uncomplicated: Secondary | ICD-10-CM | POA: Diagnosis not present

## 2018-05-06 DIAGNOSIS — J45909 Unspecified asthma, uncomplicated: Secondary | ICD-10-CM | POA: Diagnosis not present

## 2018-05-06 DIAGNOSIS — J449 Chronic obstructive pulmonary disease, unspecified: Secondary | ICD-10-CM | POA: Diagnosis not present

## 2018-05-06 DIAGNOSIS — G4733 Obstructive sleep apnea (adult) (pediatric): Secondary | ICD-10-CM | POA: Diagnosis not present

## 2018-05-06 DIAGNOSIS — J849 Interstitial pulmonary disease, unspecified: Secondary | ICD-10-CM | POA: Diagnosis not present

## 2018-05-06 DIAGNOSIS — J441 Chronic obstructive pulmonary disease with (acute) exacerbation: Secondary | ICD-10-CM | POA: Diagnosis not present

## 2018-05-07 ENCOUNTER — Telehealth: Payer: Self-pay

## 2018-05-07 MED ORDER — CARVEDILOL 3.125 MG PO TABS: 6.2500 mg | ORAL_TABLET | Freq: Two times a day (BID) | ORAL | 3 refills | Status: DC

## 2018-05-07 NOTE — Telephone Encounter (Signed)
Refill sent for Carvedilol.

## 2018-05-15 ENCOUNTER — Ambulatory Visit (INDEPENDENT_AMBULATORY_CARE_PROVIDER_SITE_OTHER): Payer: PPO | Admitting: Pulmonary Disease

## 2018-05-15 ENCOUNTER — Encounter: Payer: Self-pay | Admitting: Pulmonary Disease

## 2018-05-15 VITALS — BP 126/70 | HR 68 | Ht 66.0 in | Wt 256.6 lb

## 2018-05-15 DIAGNOSIS — J849 Interstitial pulmonary disease, unspecified: Secondary | ICD-10-CM | POA: Diagnosis not present

## 2018-05-15 DIAGNOSIS — R0600 Dyspnea, unspecified: Secondary | ICD-10-CM | POA: Diagnosis not present

## 2018-05-15 DIAGNOSIS — G4733 Obstructive sleep apnea (adult) (pediatric): Secondary | ICD-10-CM

## 2018-05-15 NOTE — Progress Notes (Signed)
Andre [name]    [id number]    Apr 16, 1936  Primary Care Physician:Sonnenberg, Angela Adam, MD  Referring Physician: [name], MD [address] [place], [place] [address]  Chief complaint: Follow-up for pulmonary fibrosis, sleep apnea.  HPI: 83 year old with history of pulmonary fibrosis, recurrent respiratory failure, eosinophilic pleural effusion, chronic diastolic heart failure, sleep apnea, Parkinson's  Previously followed  pulmonary.  He was also evaluated at [facility] for pulmonary fibrosis thought to be secondary to prior asbestos exposure.  Recommendation was made to follow-up with a CT in January 2020.  However he cannot keep the appointment due to high co-pay and wishes to transfer care to [facility]  He has had at least 3 admissions over the past year for recurrent respiratory failure, HCAP, right pleural effusion status post thoracentesis on 09/19/2017 with cell count showing exudate with elevated eosinophils.   He used to remain active until age 89 when he had a CABG.  Has run 48 marathons in the past.  After his CABG he took up long distance cycling which he had to give up about 10 years ago.  Pets: No pets Occupation: Worked as a Actor ILD questionnaire 05/15/2018: Exposure to asbestos, no other significant exposure. Smoking history:20-pack-year smoker.  Quit smoking in 1985 Travel history: No significant recent travel Relevant family history: Family history of lung disease.  Outpatient Encounter Medications as of 05/15/2018  Medication Sig  . albuterol (PROVENTIL) (2.5 MG/3ML) 0.083% nebulizer solution Take 3 mLs (2.5 mg total) by nebulization every 4 (four) hours as needed for wheezing or shortness of breath. (Patient taking differently: Take 2.5 mg by nebulization as needed for wheezing or shortness of breath. )  . carbidopa-levodopa (SINEMET IR) 25-250 MG tablet TAKE TWO TABLETS 3 TIMES DAILY (Patient taking differently: 1.5  tablets 3 (three) times daily. )  . carvedilol (COREG) 3.125 MG tablet Take 2 tablets (6.25 mg total) by mouth 2 (two) times daily with a meal. (Patient taking differently: Take 3.125 mg by mouth 2 (two) times daily with a meal. 2 tablets BID)  . cyclobenzaprine (FLEXERIL) 5 MG tablet Take 5 mg by mouth daily as needed for muscle spasms.   [name] ELIQUIS 5 MG TABS tablet TAKE ONE TABLET BY MOUTH TWICE DAILY  . entacapone (COMTAN) 200 MG tablet Take 1 tablet (200 mg total) by mouth 3 (three) times daily.  [name] escitalopram (LEXAPRO) 10 MG tablet TAKE ONE TABLET BY MOUTH EVERY DAY  . esomeprazole (NEXIUM) 20 MG capsule Take 48 mg by mouth daily at 12 noon.   . fluticasone (FLONASE) 50 MCG/ACT nasal spray TAKE 2 PUFFS IN EACH NOSTRIL EVERY DAY (Patient taking differently: TAKE 2 PUFFS IN EACH NOSTRIL EVERY DAY, takes as prn)  . furosemide (LASIX) 20 MG tablet TAKE TWO TABLETS BY MOUTH TWICE DAILY  . HYDROcodone-acetaminophen (NORCO/VICODIN) 5-325 MG tablet Take by mouth.  . isosorbide mononitrate (IMDUR) 30 MG 24 hr tablet Take 1 tablet (30 mg total) by mouth daily.  [name] ketoconazole (NIZORAL) 2 % cream   . levothyroxine (SYNTHROID, LEVOTHROID) 50 MCG tablet TAKE 1 TABLET BY MOUTH ONCE DAILY. TAKE ON EMPTY STOMACH WITH A GLASSOF WATER AT LEAST 30-60 MINUTES BEFORE BREAKFAST  . mirabegron ER (MYRBETRIQ) 50 MG TB24 tablet Take 50 mg by mouth daily.  . modafinil (PROVIGIL) 200 MG tablet Take 1 tablet (200 mg total) by mouth 2 (two) times daily. (Patient taking differently: Take 200 mg by mouth daily. )  .  mometasone (ELOCON) 0.1 % cream   . Polyethyl Glycol-Propyl Glycol (SYSTANE OP) Place 1 drop into both eyes 2 (two) times daily as needed (dry eyes).  . potassium chloride SA (K-DUR,KLOR-CON) 20 MEQ tablet Take 1 tablet (20 mEq total) by mouth daily.  . rosuvastatin (CRESTOR) 10 MG tablet Take 10 mg by mouth at bedtime.  . VENTOLIN HFA 108 (90 Base) MCG/ACT inhaler TAKE 2 PUFFS EVERY 8 HOURS AS NEEDED  FORWHEEZING  . losartan (COZAAR) 25 MG tablet Take 1 tablet (25 mg total) by mouth daily.   No facility-administered encounter medications on file as of 05/15/2018.     Allergies as of 05/15/2018 - Review Complete 05/15/2018  Allergen Reaction Noted  . Pravastatin Other (See Comments) 01/26/2015  . Prednisone Other (See Comments) 01/26/2015    Past Medical History:  Diagnosis Date  . Cervical spondylosis 10/01/2013  . Chronic diastolic CHF (congestive heart failure) ([place])    a. 07/2016 Echo: >55%; b. 10/2016 Echo: EF 55-60%, Gr1 DD, Ao sclerosis w/o stenosis, sev dil LA; c. 08/2017 Echo: EF 60-65%, no rwma, Gr2 DD, mild AS, sev dil LA/RA.  [name] Coronary artery disease    a. 1998 s/p mini-cabg @ [facility] - LIMA->LAD;  b. 07/2016 St Echo: Inadequate HR w/ HTN response;  c.  08/2016 MV: EF 67%, no ischemia; d. 10/2016 NSTEMI/Cath: RCA 95p (4.0x26 Onyx DES), LIMA->LAD nl; e. 09/2017 Cath: LM 40/30, LAD 100ost, RI 80, LCX nl, OM2/3 nl, RCA patent stent, 59m, LIMA->LAD nl-->Med Rx.  . Depression   . GERD (gastroesophageal reflux disease)   . Hyperlipidemia   . Hypertension   . PAF (paroxysmal atrial fibrillation) (HCC)    a. s/p DCCV-->maintaining sinus on amiodarone;  b. CHA2DS2VASc = 5-->eliquis.  . Parkinson's disease ([place])    tremors  . Pleural effusion, right    a. 09/2017 s/p thoracentesis.  [name] PNA (pneumonia) 08/26/2017  . Pulmonary embolism ([place]) 2011  . Pulmonary fibrosis ([place])   . 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 Sleep apnea    wears CPAP    Past Surgical History:  Procedure Laterality Date  . BACK SURGERY  1960  . CARDIAC CATHETERIZATION    . CHOLECYSTECTOMY  2010  . CORONARY ARTERY BYPASS GRAFT  01/07/1997  . CORONARY STENT INTERVENTION N/A 10/31/2016   Procedure: Coronary Stent Intervention;  Surgeon: Wellington Hampshire, MD;  Location: [place] [facility];  Service: Cardiovascular;  Laterality: N/A;  . ELECTROPHYSIOLOGIC STUDY N/A 07/14/2015   Procedure: CARDIOVERSION;  Surgeon:  Yolonda Kida, MD;  Location: [facility] ORS;  Service: Cardiovascular;  Laterality: N/A;  . ELECTROPHYSIOLOGIC STUDY N/A 10/12/2015   Procedure: CARDIOVERSION;  Surgeon: Minna Merritts, MD;  Location: [facility] ORS;  Service: Cardiovascular;  Laterality: N/A;  . LEFT HEART CATH AND CORONARY ANGIOGRAPHY N/A 10/31/2016   Procedure: Left Heart Cath and Coronary Angiography;  Surgeon: Wellington Hampshire, MD;  Location: [place] [facility];  Service: Cardiovascular;  Laterality: N/A;  . OTHER SURGICAL HISTORY  1998   Bypass  . RIGHT/LEFT HEART CATH AND CORONARY ANGIOGRAPHY N/A 09/18/2017   Procedure: RIGHT/LEFT HEART CATH AND CORONARY ANGIOGRAPHY;  Surgeon: Wellington Hampshire, MD;  Location: [place] [facility];  Service: Cardiovascular;  Laterality: N/A;    Family History  Problem Relation Age of Onset  . Alcohol abuse Father     Social History   Socioeconomic History  . Marital status: Married    Spouse name: [name]  . Number of children: 1  . Years of  education: 76  . Highest education level: Not on file  Occupational History  . Occupation: Retired    Comment: Designer, television/film set  Social Needs  . Financial resource strain: Not hard at all  . Food insecurity:    Worry: Never true    Inability: Never true  . Transportation needs:    Medical: No    Non-medical: No  Tobacco Use  . Smoking status: Former Smoker    Packs/day: 1.00    Years: 10.00    Pack years: 10.00    Types: Cigarettes, Pipe, Cigars    Last attempt to quit: 04/18/1972    Years since quitting: 46.1  . Smokeless tobacco: Former Systems developer    Types: [place] date: 04/18/1972  Substance and Sexual Activity  . Alcohol use: Yes    Alcohol/week: 4.0 standard drinks    Types: 2 Cans of beer, 2 Shots of liquor per week    Comment: per 2 weeks   . Drug use: No  . Sexual activity: Yes  Lifestyle  . Physical activity:    Days per week: 0 days    Minutes per session: Not on file  . Stress: Not on file  Relationships  .  Social connections:    Talks on phone: Not on file    Gets together: Not on file    Attends religious service: Not on file    Active member of club or organization: Not on file    Attends meetings of clubs or organizations: Not on file    Relationship status: Not on file  . Intimate partner violence:    Fear of current or ex partner: No    Emotionally abused: No    Physically abused: No    Forced sexual activity: No  Other Topics Concern  . Not on file  Social History Narrative   Lives w/ wife   Caffeine use: none   Right-handed    Review of systems: Review of Systems  Constitutional: Negative for fever and chills.  HENT: Negative.   Eyes: Negative for blurred vision.  Respiratory: as per HPI  Cardiovascular: Negative for chest pain and palpitations.  Gastrointestinal: Negative for vomiting, diarrhea, blood per rectum. Genitourinary: Negative for dysuria, urgency, frequency and hematuria.  Musculoskeletal: Negative for myalgias, back pain and joint pain.  Skin: Negative for itching and rash.  Neurological: Negative for dizziness, tremors, focal weakness, seizures and loss of consciousness.  Endo/Heme/Allergies: Negative for environmental allergies.  Psychiatric/Behavioral: Negative for depression, suicidal ideas and hallucinations.  All other systems reviewed and are negative.  Physical Exam: Blood pressure 126/70, pulse 68, height 5\' 6"  (1.676 m), weight 256 lb 9.6 oz (116.4 kg), SpO2 95 %. Gen:      No acute distress HEENT:  EOMI, sclera anicteric Neck:     No masses; no thyromegaly Lungs:    Clear to auscultation bilaterally; normal respiratory effort CV:         Regular rate and rhythm; no murmurs Abd:      + bowel sounds; soft, non-tender; no palpable masses, no distension Ext:    No edema; adequate peripheral perfusion Skin:      Warm and dry; no rash Neuro: alert and oriented x 3 Psych: normal mood and affect  Data Reviewed: Imaging: CT high-resolution  05/30/2017- moderate centrilobular emphysema, mild basilar subpleural reticulation, groundglass and traction bronchiectasis.  No honeycombing.  Calcified granuloma in the right upper lobe, calcified pleural plaques. CT chest 06/09/2015- peripheral and basal fibrotic  changes with no honeycombing.  Calcified pleural plaques CT chest 12/06/2011- peripheral and basal fibrotic changes, calcified pleural plaques. I have reviewed the images personally.  CT chest high-resolution [[facility]] 01/22/2018 1. Primarily basilar peripheral interlobular septal thickening. Findings may represent changes of chronic edema vs early fibrosis. New small right and trace left pleural effusions with associated calcified pleural plaques. Correlate with history of asbestos exposure.  2. Apparent increased thickening of the right basilar pleura, incompletely assessed. Consider PET/CT or pleural fluid sampling for further evaluation if not already obtained. Differential considerations include fibrosis or malignancy such as mesothelioma. 3. Aortic valve calcifications.  4. Scattered less than 3 mm pulmonary nodules. If the patient is low risk for lung cancer, no further follow-up is recommended. If the patient is high risk for lung cancer, consider 12 month follow-up CT. (2017 Fleischner Guidelines)  PFTs: 09/25/2012 FVC 2.52 [69%], FEV1 1.95 [75%), F/F 77, TLC 72%, DLCO 70% Moderate obstructive airways, minimal restriction and diffusion defect.  Labs: ANA 01/22/2018- negative, CCP-negative  Pleural fluid 09/19/2017- LDH 462, total protein 3.9 WBC 3661, 59% eos, 27% lymphs, 10% neutrophils Cytology-mixed reactive and inflammatory cells.  No malignancy.  Cardiac Echocardiogram 08/28/2017- LVEF 74-08%, grade 2 diastolic dysfunction, mild aortic stenosis.  Severe dilatation of left atrium and right atrium, trivial pericardial effusion.  Cardiac catheterization 09/18/2017 Significant three-vessel coronary artery disease with  patent grafts.  Right heart catheterization shows mildly elevated filling pressures, minimal pulmonary hypertension Wedge 12, PA pressure 39/17 (24), cardiac output 5.08 L/min PVR 189, 2.4 Wood units  Sleep CPAP titration 11/04/2015- CPAP titrated to 12 cm of water.  Download 04/15/2018-05/14/2018 Set pressure 12 cm of water 100% usage greater than 4 hours Residual AHI 1.2  Assessment:  Pulmonary fibrosis CT scan reviewed with basilar fibrosis, pleural calcifications consistent with asbestos exposure The fibrotic changes have remained stable since 2013 We will continue monitoring with repeat scans and pulmonary function tests. No need for treatment with Ofev unless there is progression  Emphysema Noted on CT scan.  Patient has remote smoking exposure Review PFTs and determine if we need inhalers  Dyspnea Multifactorial from obesity, diastolic heart failure, coronary artery disease Suspect anxiety is a significant component to his episodes of dyspnea Advised to continue weight loss and exercise. Referral to pulmonary rehab for breathing techniques  Right pleural effusion exudative, eosinophilic S.p thoracentesis dring hospitalization in 2019.  The cell count is eosinophilic which could be from pneumonia versus asbestos-related effusion Follow-up CT shows thickening of the pleura in that area We will need to monitor this area for possible mesothelioma.  Review CT scan when available.  Obstructive sleep apnea Stable on CPAP Reviewed download-shows good compliance  Plan/Recommendations: - High-resolution CT, pulmonary function test - Weight loss with diet and exercise - Referral to pulmonary rehab - CPAP download for review.  Marshell Garfinkel MD [facility] 05/15/2018, 3:45 PM  CC: [name], MD

## 2018-05-15 NOTE — Patient Instructions (Signed)
We will schedule you for a high-resolution CT, follow-up pulmonary function test and 6-minute walk test  Refer you for pulmonary rehab at [facility] We will get a download of his CPAP for review Follow-up in 1 month.

## 2018-05-19 ENCOUNTER — Other Ambulatory Visit: Payer: Self-pay | Admitting: Cardiovascular Disease

## 2018-05-23 ENCOUNTER — Other Ambulatory Visit: Payer: Self-pay | Admitting: *Deleted

## 2018-05-23 MED ORDER — MIRABEGRON ER 50 MG PO TB24: 50.0000 mg | ORAL_TABLET | Freq: Every day | ORAL | 11 refills | Status: DC

## 2018-05-25 ENCOUNTER — Telehealth: Payer: Self-pay | Admitting: Cardiovascular Disease

## 2018-05-25 DIAGNOSIS — M4802 Spinal stenosis, cervical region: Secondary | ICD-10-CM | POA: Diagnosis not present

## 2018-05-25 DIAGNOSIS — M5412 Radiculopathy, cervical region: Secondary | ICD-10-CM | POA: Diagnosis not present

## 2018-05-25 DIAGNOSIS — M62838 Other muscle spasm: Secondary | ICD-10-CM | POA: Diagnosis not present

## 2018-05-25 DIAGNOSIS — M47812 Spondylosis without myelopathy or radiculopathy, cervical region: Secondary | ICD-10-CM | POA: Diagnosis not present

## 2018-05-25 DIAGNOSIS — M5030 Other cervical disc degeneration, unspecified cervical region: Secondary | ICD-10-CM | POA: Diagnosis not present

## 2018-05-25 NOTE — Telephone Encounter (Signed)
   [facility] Pre-operative Risk Assessment    Request for surgical clearance:  1. What type of surgery is being performed? CERVICAL ESI  2. When is this surgery scheduled? 06/14/18  3. What type of clearance is required (medical clearance vs. Pharmacy clearance to hold med vs. Both)? PHARMACY  4. Are there any medications that need to be held prior to surgery and how long?ELIQUIS   5. Practice name and name of physician performing surgery? [facility]  6. What is your office phone number [phone]   7.   What is your office fax number [phone]  8.   Anesthesia type (None, local, MAC, general) ? NOT LISTED   [name] 05/25/2018, 4:05 PM  _________________________________________________________________   (provider comments below)

## 2018-05-27 DIAGNOSIS — J45909 Unspecified asthma, uncomplicated: Secondary | ICD-10-CM | POA: Diagnosis not present

## 2018-05-27 DIAGNOSIS — G4733 Obstructive sleep apnea (adult) (pediatric): Secondary | ICD-10-CM | POA: Diagnosis not present

## 2018-05-27 DIAGNOSIS — J441 Chronic obstructive pulmonary disease with (acute) exacerbation: Secondary | ICD-10-CM | POA: Diagnosis not present

## 2018-05-27 DIAGNOSIS — J449 Chronic obstructive pulmonary disease, unspecified: Secondary | ICD-10-CM | POA: Diagnosis not present

## 2018-05-29 ENCOUNTER — Ambulatory Visit (INDEPENDENT_AMBULATORY_CARE_PROVIDER_SITE_OTHER): Payer: PPO | Admitting: *Deleted

## 2018-05-29 DIAGNOSIS — J849 Interstitial pulmonary disease, unspecified: Secondary | ICD-10-CM | POA: Diagnosis not present

## 2018-05-29 NOTE — Telephone Encounter (Signed)
Patient with diagnosis of atrial fibrillation on Eliquis for anticoagulation.    Procedure: cervical ESI Date of procedure: 06/14/2018  CHADS2-VASc score of  5 (CHF, HTN, AGE, , CAD, AGE, )  CrCl 91.5 Platelet count 126  Per office protocol, patient can hold Eliquis for 3 days prior to procedure.

## 2018-05-29 NOTE — Progress Notes (Signed)
SIX MIN WALK 05/29/2018 09/08/2017 09/08/2017 05/26/2017 08/31/2016 08/31/2016 09/04/2012  Medications Sinemet IR 25/250mg , Flexeril 5mg , Comtan 200mg , Lexapro 10mg , Lasix 40mg , Synthroid 50 mcg, Myrbetriq 50mg , Provigil 200mg - taken approx 9:30 am.  - All AM meds - - Carbidopa-levodopa x2 -  Supplimental Oxygen during Test? (L/min) No Yes No No - No No  O2 Flow Rate - 2 - - - - -  Type - Continuous - - - - -  Laps 8 - 6 - - 7 -  Partial Lap (in Meters) 0 - 12 - - 0 -  Baseline BP (sitting) 126/66 - 122/80 - - 170/80 -  Baseline Heartrate 64 - 64 - - 77 -  Baseline Dyspnea (Borg Scale) 2 - 2 - - 0 -  Baseline Fatigue (Borg Scale) 2 - 4 - - 3 -  Baseline SPO2 95 - 96 - - 98 -  BP (sitting) 138/74 - 130/82 - - 180/80 -  Heartrate 78 - 79 - - 79 -  Dyspnea (Borg Scale) 3 - 4 - - 1 -  Fatigue (Borg Scale) 3 - 5 - - 2 -  SPO2 91 - 85 - - 93 -  BP (sitting) 130/68 126/80 - - - 180/79 -  Heartrate 70 77 - - - 72 -  SPO2 98 96 - - - 96 -  Stopped or Paused before Six Minutes Yes No Yes - - No -  Other Symptoms at end of Exercise patient walked for a total of 4 minutes and 50 seconds.  Stopped test at patient request d/t calf pain, dyspnea, and dizziness.  - Pt stopped with 1:29minutes left due to low O2 sats. Pt placed on O2 2lpm continuous flow and sats of 96%- Pt sent home on [facility] O2 tank as he has O2 at home.  - - - -  Interpretation Dizziness;Leg pain;Calf pain - - - - - -  Distance Completed 272 - 300 - - 336 -  Tech Comments: - - - - no desaturation Patient walked at brisk pace with no complaints of sob,chest tightness or wheeze. Patient was able to hold conversation entire walk. -

## 2018-05-29 NOTE — Telephone Encounter (Signed)
Clinical pharmacist to review eliquis 

## 2018-05-30 NOTE — Telephone Encounter (Signed)
Left message for patient to call back and speak to on call preop app.

## 2018-05-30 NOTE — Telephone Encounter (Signed)
   Primary Cardiologist: [name], MD  Chart reviewed as part of pre-operative protocol coverage. Patient was contacted 05/30/2018 in reference to pre-operative risk assessment for pending surgery as outlined below.  DEBBIE BELLUCCI was last seen on 03/22/2018 by Dr. Fletcher Anon.  Since that day, JAHIR HALT has done well without significant chest pain or shortness of breath.  Therefore, based on ACC/AHA guidelines, the patient would be at acceptable risk for the planned procedure without further cardiovascular testing.   I will route this recommendation to the requesting party via Epic fax function and remove from pre-op pool.  Please call with questions. Per our clinical pharmacist, patient can hold Eliquis for 3 days prior to procedure and restart as soon as possible after the procedure at the discretion of performing provider.   [place], [place] 05/30/2018, 2:54 PM

## 2018-06-06 DIAGNOSIS — G4733 Obstructive sleep apnea (adult) (pediatric): Secondary | ICD-10-CM | POA: Diagnosis not present

## 2018-06-06 DIAGNOSIS — J45909 Unspecified asthma, uncomplicated: Secondary | ICD-10-CM | POA: Diagnosis not present

## 2018-06-06 DIAGNOSIS — J449 Chronic obstructive pulmonary disease, unspecified: Secondary | ICD-10-CM | POA: Diagnosis not present

## 2018-06-06 DIAGNOSIS — J849 Interstitial pulmonary disease, unspecified: Secondary | ICD-10-CM | POA: Diagnosis not present

## 2018-06-06 DIAGNOSIS — J441 Chronic obstructive pulmonary disease with (acute) exacerbation: Secondary | ICD-10-CM | POA: Diagnosis not present

## 2018-06-07 ENCOUNTER — Ambulatory Visit
Admission: RE | Admit: 2018-06-07 | Discharge: 2018-06-07 | Disposition: A | Discharge status: Home / Self Care | Payer: PPO | Attending: Gastroenterology | Admitting: Gastroenterology

## 2018-06-07 ENCOUNTER — Ambulatory Visit: Payer: PPO | Admitting: Anesthesiology

## 2018-06-07 ENCOUNTER — Encounter
Admission: RE | Disposition: A | Discharge status: Home / Self Care | Payer: Self-pay | Source: Home / Self Care | Attending: Gastroenterology

## 2018-06-07 DIAGNOSIS — J8410 Pulmonary fibrosis, unspecified: Secondary | ICD-10-CM | POA: Insufficient documentation

## 2018-06-07 DIAGNOSIS — I110 Hypertensive heart disease with heart failure: Secondary | ICD-10-CM | POA: Insufficient documentation

## 2018-06-07 DIAGNOSIS — G20 Parkinson's disease: Secondary | ICD-10-CM | POA: Diagnosis not present

## 2018-06-07 DIAGNOSIS — D123 Benign neoplasm of transverse colon: Secondary | ICD-10-CM | POA: Diagnosis not present

## 2018-06-07 DIAGNOSIS — G4733 Obstructive sleep apnea (adult) (pediatric): Secondary | ICD-10-CM | POA: Diagnosis not present

## 2018-06-07 DIAGNOSIS — D122 Benign neoplasm of ascending colon: Secondary | ICD-10-CM | POA: Insufficient documentation

## 2018-06-07 DIAGNOSIS — I700 Atherosclerosis of aorta: Secondary | ICD-10-CM | POA: Diagnosis not present

## 2018-06-07 DIAGNOSIS — D125 Benign neoplasm of sigmoid colon: Secondary | ICD-10-CM | POA: Diagnosis not present

## 2018-06-07 DIAGNOSIS — Z86711 Personal history of pulmonary embolism: Secondary | ICD-10-CM | POA: Insufficient documentation

## 2018-06-07 DIAGNOSIS — K5730 Diverticulosis of large intestine without perforation or abscess without bleeding: Secondary | ICD-10-CM | POA: Diagnosis not present

## 2018-06-07 DIAGNOSIS — I480 Paroxysmal atrial fibrillation: Secondary | ICD-10-CM | POA: Insufficient documentation

## 2018-06-07 DIAGNOSIS — Z86010 Personal history of colonic polyps: Secondary | ICD-10-CM | POA: Insufficient documentation

## 2018-06-07 DIAGNOSIS — Z79890 Hormone replacement therapy (postmenopausal): Secondary | ICD-10-CM | POA: Diagnosis not present

## 2018-06-07 DIAGNOSIS — G4730 Sleep apnea, unspecified: Secondary | ICD-10-CM | POA: Diagnosis not present

## 2018-06-07 DIAGNOSIS — D126 Benign neoplasm of colon, unspecified: Secondary | ICD-10-CM | POA: Diagnosis not present

## 2018-06-07 DIAGNOSIS — E785 Hyperlipidemia, unspecified: Secondary | ICD-10-CM | POA: Insufficient documentation

## 2018-06-07 DIAGNOSIS — Z79899 Other long term (current) drug therapy: Secondary | ICD-10-CM | POA: Insufficient documentation

## 2018-06-07 DIAGNOSIS — Z8719 Personal history of other diseases of the digestive system: Secondary | ICD-10-CM | POA: Diagnosis not present

## 2018-06-07 DIAGNOSIS — K635 Polyp of colon: Secondary | ICD-10-CM | POA: Diagnosis not present

## 2018-06-07 DIAGNOSIS — I5032 Chronic diastolic (congestive) heart failure: Secondary | ICD-10-CM | POA: Diagnosis not present

## 2018-06-07 DIAGNOSIS — Z09 Encounter for follow-up examination after completed treatment for conditions other than malignant neoplasm: Secondary | ICD-10-CM | POA: Diagnosis present

## 2018-06-07 DIAGNOSIS — E039 Hypothyroidism, unspecified: Secondary | ICD-10-CM | POA: Diagnosis not present

## 2018-06-07 DIAGNOSIS — Z7951 Long term (current) use of inhaled steroids: Secondary | ICD-10-CM | POA: Insufficient documentation

## 2018-06-07 DIAGNOSIS — K219 Gastro-esophageal reflux disease without esophagitis: Secondary | ICD-10-CM | POA: Insufficient documentation

## 2018-06-07 DIAGNOSIS — I2510 Atherosclerotic heart disease of native coronary artery without angina pectoris: Secondary | ICD-10-CM | POA: Insufficient documentation

## 2018-06-07 DIAGNOSIS — Z7901 Long term (current) use of anticoagulants: Secondary | ICD-10-CM | POA: Insufficient documentation

## 2018-06-07 DIAGNOSIS — K5790 Diverticulosis of intestine, part unspecified, without perforation or abscess without bleeding: Secondary | ICD-10-CM | POA: Diagnosis not present

## 2018-06-07 DIAGNOSIS — Z1211 Encounter for screening for malignant neoplasm of colon: Secondary | ICD-10-CM | POA: Diagnosis not present

## 2018-06-07 HISTORY — DX: Hypothyroidism, unspecified: E03.9

## 2018-06-07 HISTORY — DX: Other intervertebral disc degeneration, lumbar region: M51.36

## 2018-06-07 HISTORY — DX: Atherosclerotic heart disease of native coronary artery without angina pectoris: I25.10

## 2018-06-07 HISTORY — DX: Atherosclerosis of aorta: I70.0

## 2018-06-07 HISTORY — DX: Other cervical disc degeneration, unspecified cervical region: M50.30

## 2018-06-07 HISTORY — PX: COLONOSCOPY WITH PROPOFOL: SHX5780

## 2018-06-07 HISTORY — DX: Thrombocytopenia, unspecified: D69.6

## 2018-06-07 HISTORY — DX: Other intervertebral disc degeneration, lumbar region without mention of lumbar back pain or lower extremity pain: M51.369

## 2018-06-07 LAB — CBC WITH DIFFERENTIAL/PLATELET
Abs Immature Granulocytes: 0.03 10*3/uL (ref 0.00–0.07)
Basophils Absolute: 0.1 10*3/uL (ref 0.0–0.1)
Basophils Relative: 1 %
Eosinophils Absolute: 0.1 10*3/uL (ref 0.0–0.5)
Eosinophils Relative: 1 %
HCT: 43.8 % (ref 39.0–52.0)
Hemoglobin: 14.1 g/dL (ref 13.0–17.0)
Immature Granulocytes: 0 %
Lymphocytes Relative: 18 %
Lymphs Abs: 1.3 10*3/uL (ref 0.7–4.0)
MCH: 28.5 pg (ref 26.0–34.0)
MCHC: 32.2 g/dL (ref 30.0–36.0)
MCV: 88.5 fL (ref 80.0–100.0)
Monocytes Absolute: 0.7 10*3/uL (ref 0.1–1.0)
Monocytes Relative: 9 %
Neutro Abs: 5.4 10*3/uL (ref 1.7–7.7)
Neutrophils Relative %: 71 %
Platelets: 126 10*3/uL — ABNORMAL LOW (ref 150–400)
RBC: 4.95 MIL/uL (ref 4.22–5.81)
RDW: 14.4 % (ref 11.5–15.5)
WBC: 7.7 10*3/uL (ref 4.0–10.5)
nRBC: 0 % (ref 0.0–0.2)

## 2018-06-07 LAB — PROTIME-INR
INR: 1
Prothrombin Time: 13.1 seconds (ref 11.4–15.2)

## 2018-06-07 LAB — HM COLONOSCOPY

## 2018-06-07 SURGERY — COLONOSCOPY WITH PROPOFOL
Anesthesia: General

## 2018-06-07 MED ORDER — PROPOFOL 10 MG/ML IV BOLUS
INTRAVENOUS | Status: DC | PRN
  Administered 2018-06-07: 90 mg via INTRAVENOUS
  Administered 2018-06-07: 20 mg via INTRAVENOUS

## 2018-06-07 MED ORDER — OXYMETAZOLINE HCL 0.05 % NA SOLN
NASAL | Status: AC
  Filled 2018-06-07: qty 30

## 2018-06-07 MED ORDER — EPHEDRINE SULFATE 50 MG/ML IJ SOLN
INTRAMUSCULAR | Status: DC | PRN
  Administered 2018-06-07 (×2): 15 mg via INTRAVENOUS
  Administered 2018-06-07 (×2): 10 mg via INTRAVENOUS

## 2018-06-07 MED ORDER — PROPOFOL 500 MG/50ML IV EMUL
INTRAVENOUS | Status: AC
  Filled 2018-06-07: qty 50

## 2018-06-07 MED ORDER — SODIUM CHLORIDE 0.9 % IV SOLN
INTRAVENOUS | Status: DC
  Administered 2018-06-07: 10:00:00 via INTRAVENOUS

## 2018-06-07 MED ORDER — PHENYLEPHRINE HCL 10 MG/ML IJ SOLN
INTRAMUSCULAR | Status: DC | PRN
  Administered 2018-06-07: 100 ug via INTRAVENOUS

## 2018-06-07 MED ORDER — SODIUM CHLORIDE 0.9 % IV SOLN
INTRAVENOUS | Status: DC
  Administered 2018-06-07: 1000 mL via INTRAVENOUS

## 2018-06-07 MED ORDER — PROPOFOL 500 MG/50ML IV EMUL
INTRAVENOUS | Status: DC | PRN
  Administered 2018-06-07: 100 ug/kg/min via INTRAVENOUS

## 2018-06-07 MED ORDER — EPHEDRINE SULFATE 50 MG/ML IJ SOLN
INTRAMUSCULAR | Status: AC
  Filled 2018-06-07: qty 1

## 2018-06-07 NOTE — Anesthesia Preprocedure Evaluation (Signed)
Anesthesia Evaluation  Patient identified by MRN, date of birth, ID band Patient awake    Reviewed: Allergy & Precautions, H&P , NPO status , Patient's Chart, lab work & pertinent test results, reviewed documented beta blocker date and time   Airway Mallampati: II   Neck ROM: full    Dental  (+) Poor Dentition, Teeth Intact   Pulmonary neg pulmonary ROS, shortness of breath, with exertion and at rest, sleep apnea , pneumonia, former smoker,     + decreased breath sounds      Cardiovascular Exercise Tolerance: Poor hypertension, + angina with exertion + CAD and +CHF  negative cardio ROS Normal cardiovascular exam Rhythm:regular Rate:Normal     Neuro/Psych PSYCHIATRIC DISORDERS Anxiety Depression negative neurological ROS  negative psych ROS   GI/Hepatic negative GI ROS, Neg liver ROS, GERD  Medicated,  Endo/Other  negative endocrine ROSHypothyroidism Morbid obesity  Renal/GU Renal diseasenegative Renal ROS  negative genitourinary   Musculoskeletal   Abdominal   Peds  Hematology negative hematology ROS (+) Blood dyscrasia, anemia ,   Anesthesia Other Findings Past Medical History: No date: Atherosclerosis of abdominal aorta (HCC) No date: CAD (coronary artery disease) 10/01/2013: Cervical spondylosis No date: Chronic diastolic CHF (congestive heart failure) ([place])     Comment:  a. 07/2016 Echo: >55%; b. 10/2016 Echo: EF 55-60%, Gr1 DD,              Ao sclerosis w/o stenosis, sev dil LA; c. 08/2017 Echo: EF              60-65%, no rwma, Gr2 DD, mild AS, sev dil LA/RA. No date: Coronary artery disease     Comment:  a. 1998 s/p mini-cabg @ [facility] - LIMA->LAD;  b. 07/2016 St               Echo: Inadequate HR w/ HTN response;  c.  08/2016 MV: EF               67%, no ischemia; d. 10/2016 NSTEMI/Cath: RCA 95p (4.0x26               Onyx DES), LIMA->LAD nl; e. 09/2017 Cath: LM 40/30, LAD               100ost, RI 80, LCX nl, OM2/3 nl,  RCA patent stent, 11m,               LIMA->LAD nl-->Med Rx. No date: DDD (degenerative disc disease), cervical No date: DDD (degenerative disc disease), lumbar No date: Depression No date: GERD (gastroesophageal reflux disease) No date: Hyperlipidemia No date: Hypertension No date: Hypothyroidism No date: PAF (paroxysmal atrial fibrillation) (HCC)     Comment:  a. s/p DCCV-->maintaining sinus on amiodarone;  b.               CHA2DS2VASc = 5-->eliquis. No date: Parkinson's disease [facility])     Comment:  tremors No date: Pleural effusion, right     Comment:  a. 09/2017 s/p thoracentesis. 08/26/2017: PNA (pneumonia) 2011: Pulmonary embolism ([place]) No date: Pulmonary fibrosis ([place]) 01/28/2015: Secondary erythrocytosis No date: Sleep apnea     Comment:  wears CPAP No date: Thrombocytopenia ([place]) Past Surgical History: 1960: BACK SURGERY No date: CARDIAC CATHETERIZATION 2010: CHOLECYSTECTOMY 01/07/1997: CORONARY ARTERY BYPASS GRAFT 10/31/2016: CORONARY STENT INTERVENTION; N/A     Comment:  Procedure: Coronary Stent Intervention;  Surgeon: Wellington Hampshire, MD;  Location: [place] [facility]  LAB;                Service: Cardiovascular;  Laterality: N/A; 07/14/2015: ELECTROPHYSIOLOGIC STUDY; N/A     Comment:  Procedure: CARDIOVERSION;  Surgeon: Yolonda Kida,               MD;  Location: [facility] ORS;  Service: Cardiovascular;                Laterality: N/A; 10/12/2015: ELECTROPHYSIOLOGIC STUDY; N/A     Comment:  Procedure: CARDIOVERSION;  Surgeon: Minna Merritts,               MD;  Location: [facility] ORS;  Service: Cardiovascular;                Laterality: N/A; 10/31/2016: LEFT HEART CATH AND CORONARY ANGIOGRAPHY; N/A     Comment:  Procedure: Left Heart Cath and Coronary Angiography;                Surgeon: Wellington Hampshire, MD;  Location: [place]               CV LAB;  Service: Cardiovascular;  Laterality: N/A; 1998: OTHER SURGICAL HISTORY     Comment:  Bypass 09/18/2017:  RIGHT/LEFT HEART CATH AND CORONARY ANGIOGRAPHY; N/A     Comment:  Procedure: RIGHT/LEFT HEART CATH AND CORONARY               ANGIOGRAPHY;  Surgeon: Wellington Hampshire, MD;  Location:               [place] [facility] LAB;  Service: Cardiovascular;                Laterality: N/A; BMI    Body Mass Index:  40.72 kg/m     Reproductive/Obstetrics negative OB ROS                             Anesthesia Physical Anesthesia Plan  ASA: IV  Anesthesia Plan: General   Post-op Pain Management:    Induction:   PONV Risk Score and Plan:   Airway Management Planned:   Additional Equipment:   Intra-op Plan:   Post-operative Plan:   Informed Consent: I have reviewed the patients History and Physical, chart, labs and discussed the procedure including the risks, benefits and alternatives for the proposed anesthesia with the patient or authorized representative who has indicated his/her understanding and acceptance.     Dental Advisory Given  Plan Discussed with: CRNA  Anesthesia Plan Comments: (Ongoing pulmonary workup.  Pt still with baseline mild to moderate SOB but no recent worsening and this is currently under evaluation.  As per discussion with Dr. Gustavo Lah and pt., will plan on proceeding with the colonoscopy only.  JA)        Anesthesia Quick Evaluation

## 2018-06-07 NOTE — Op Note (Signed)
[facility] Gastroenterology Patient Name: Torri Langston Procedure Date: 06/07/2018 9:27 AM MRN: [id number] Account #: [id number] Date of Birth: 1936/03/24 Admit Type: Outpatient Age: 83 Room: [facility] ENDO ROOM 1 Gender: Male Note Status: Finalized Procedure:            Colonoscopy Indications:          Personal history of colonic polyps Providers:            Lollie Sails, MD Referring MD:         Angela Adam. [name] (Referring MD) Medicines:            Monitored Anesthesia Care Complications:        No immediate complications. Procedure:            Pre-Anesthesia Assessment:                       - ASA Grade Assessment: III - A patient with severe                        systemic disease.                       After obtaining informed consent, the colonoscope was                        passed under direct vision. Throughout the procedure,                        the patient's blood pressure, pulse, and oxygen                        saturations were monitored continuously. The was                        introduced through the anus and advanced to the the                        cecum, identified by appendiceal orifice and ileocecal                        valve. The colonoscopy was unusually difficult due to                        poor bowel prep and a redundant colon. Successful                        completion of the procedure was aided by lavage. The                        quality of the bowel preparation was fair. Findings:      A 11 mm polyp was found in the sigmoid colon proximal sigmoid colon. The       polyp was semi-pedunculated. The polyp was removed with a hot snare.       Resection and retrieval were complete.      Five sessile and semi-pedunculated polyps were found in the hepatic       flexure/proximal transverse. The polyps were 3 to 8 mm in size. These       polyps were removed with a cold snare. Resection and retrieval were  complete.  Five semi-pedunculated polyps were found in the hepatic flexure/proximal       transverse. The polyps were 7 to 13 mm in size. These polyps were       removed with a hot snare. Resection and retrieval were complete. To       prevent bleeding after the polypectomy, one hemostatic clip was       successfully placed (MR conditional). There was no bleeding at the end       of the maneuver.      Three semi-pedunculated polyps were found in the ascending colon. The       polyps were 4 to 7 mm in size. These polyps were removed with a cold       snare. Resection and retrieval were complete. To prevent bleeding after       the polypectomy, one hemostatic clip was successfully placed (MR       conditional). There was no bleeding at the end of the maneuver.      Multiple medium-mouthed diverticula were found in the sigmoid colon and       descending colon.      The digital rectal exam was normal. Impression:           - Preparation of the colon was fair.                       - One 11 mm polyp in the sigmoid colon in the proximal                        sigmoid colon, removed with a hot snare. Resected and                        retrieved.                       - Five 3 to 8 mm polyps at the hepatic flexure, removed                        with a cold snare. Resected and retrieved.                       - Five 7 to 13 mm polyps at the hepatic flexure,                        removed with a hot snare. Resected and retrieved. Clip                        (MR conditional) was placed.                       - Three 4 to 7 mm polyps in the ascending colon,                        removed with a cold snare. Resected and retrieved. Clip                        (MR conditional) was placed.                       - Diverticulosis in the sigmoid colon and in the  descending colon. Recommendation:       - Discharge patient to home.                       - No aspirin, ibuprofen, naproxen, or  other                        non-steroidal anti-inflammatory drugs for 10 days after                        polyp removal.                       - Resume Eliquis (apixaban) at prior dose in 2 days.                       - Await pathology results. Procedure Code(s):    --- Professional ---                       [phone], Colonoscopy, flexible; with removal of tumor(s),                        polyp(s), or other lesion(s) by snare technique Diagnosis Code(s):    --- Professional ---                       D12.5, Benign neoplasm of sigmoid colon                       D12.3, Benign neoplasm of transverse colon (hepatic                        flexure or splenic flexure)                       D12.2, Benign neoplasm of ascending colon                       Z86.010, Personal history of colonic polyps                       K57.30, Diverticulosis of large intestine without                        perforation or abscess without bleeding CPT copyright 2018 American Medical Association. All rights reserved. The codes documented in this report are preliminary and upon coder review may  be revised to meet current compliance requirements. Lollie Sails, MD 06/07/2018 11:37:06 AM This report has been signed electronically. Number of Addenda: 0 Note Initiated On: 06/07/2018 9:27 AM Scope Withdrawal Time: 0 hours 49 minutes 24 seconds  Total Procedure Duration: 1 hour 36 minutes 4 seconds       [facility]

## 2018-06-07 NOTE — Transfer of Care (Signed)
Immediate Anesthesia Transfer of Care Note  Patient: Andre [name]  Procedure(s) Performed: COLONOSCOPY WITH PROPOFOL (N/A )  Patient Location: Endoscopy Unit  Anesthesia Type:General  Level of Consciousness: drowsy and patient cooperative  Airway & Oxygen Therapy: Patient Spontanous Breathing and Patient connected to nasal cannula oxygen  Post-op Assessment: Report given to RN and Post -op Vital signs reviewed and stable  Post vital signs: Reviewed and stable  Last Vitals:  Vitals Value Taken Time  BP 112/64 06/07/2018 11:33 AM  Temp 35.8 C 06/07/2018 11:31 AM  Pulse 58 06/07/2018 11:33 AM  Resp 14 06/07/2018 11:33 AM  SpO2 95 % 06/07/2018 11:33 AM  Vitals shown include unvalidated device data.  Last Pain:  Vitals:   06/07/18 1131  TempSrc: Tympanic  PainSc:          Complications: No apparent anesthesia complications

## 2018-06-07 NOTE — H&P (Signed)
Outpatient short stay form Pre-procedure 06/07/2018 9:26 AM Lollie Sails MD  Primary Physician: Dr. [name]  Reason for visit: Colonoscopy   History of present illness: Patient is a 83 year old male presenting today for a colonoscopy in regards to his personal history of adenomatous colon polyps.  He also has a history of Barrett's esophagus however he is in the middle of a pulmonology work-up in regards to pulmonary fibrosis.  Seems to be doing well today from pulmonary standpoint although he does occasionally get short of breath.  This was discussed with anesthesia and we have decided hold on the upper scope, go forward with a colonoscopy and reassess for his EGD after his pulmonary evaluation.  Tolerated his prep well.  He takes Eliquis which has been held for over 48 hours.  He takes no other blood thinning medication or aspirin product.    Current Facility-Administered Medications:  .  0.9 %  sodium chloride infusion, , Intravenous, Continuous, Lollie Sails, MD .  0.9 %  sodium chloride infusion, , Intravenous, Continuous, Lollie Sails, MD, Last Rate: 20 mL/hr at 06/07/18 0915, 1,000 mL at 06/07/18 0915  Medications Prior to Admission  Medication Sig Dispense Refill Last Dose  . carbidopa-levodopa (SINEMET IR) 25-250 MG tablet TAKE TWO TABLETS 3 TIMES DAILY (Patient taking differently: 1.5 tablets 3 (three) times daily. ) 540 tablet 3 06/06/2018 at Unknown time  . carvedilol (COREG) 3.125 MG tablet Take 2 tablets (6.25 mg total) by mouth 2 (two) times daily with a meal. (Patient taking differently: Take 3.125 mg by mouth 2 (two) times daily with a meal. 2 tablets BID) 120 tablet 3 06/06/2018 at Unknown time  . cyclobenzaprine (FLEXERIL) 5 MG tablet Take 5 mg by mouth daily as needed for muscle spasms.    06/06/2018 at Unknown time  . ELIQUIS 5 MG TABS tablet TAKE ONE TABLET BY MOUTH TWICE DAILY 60 tablet 5 Past Week at Unknown time  . entacapone (COMTAN) 200 MG tablet  Take 1 tablet (200 mg total) by mouth 3 (three) times daily. 270 tablet 3 06/06/2018 at Unknown time  . escitalopram (LEXAPRO) 10 MG tablet TAKE ONE TABLET BY MOUTH EVERY DAY 90 tablet 1 06/06/2018 at Unknown time  . esomeprazole (NEXIUM) 20 MG capsule Take 48 mg by mouth daily at 12 noon.    06/06/2018 at Unknown time  . fluticasone (FLONASE) 50 MCG/ACT nasal spray TAKE 2 PUFFS IN EACH NOSTRIL EVERY DAY (Patient taking differently: TAKE 2 PUFFS IN EACH NOSTRIL EVERY DAY, takes as prn) 16 g 3 06/06/2018 at Unknown time  . furosemide (LASIX) 20 MG tablet TAKE TWO TABLETS BY MOUTH TWICE DAILY 120 tablet 3 06/06/2018 at Unknown time  . HYDROcodone-acetaminophen (NORCO/VICODIN) 5-325 MG tablet Take by mouth.   06/06/2018 at Unknown time  . isosorbide mononitrate (IMDUR) 30 MG 24 hr tablet Take 1 tablet (30 mg total) by mouth daily. 30 tablet 3 06/06/2018 at Unknown time  . ketoconazole (NIZORAL) 2 % cream    06/06/2018 at Unknown time  . levothyroxine (SYNTHROID, LEVOTHROID) 50 MCG tablet TAKE 1 TABLET BY MOUTH ONCE DAILY. TAKE ON EMPTY STOMACH WITH A GLASSOF WATER AT LEAST 30-60 MINUTES BEFORE BREAKFAST 90 tablet 1 06/06/2018 at Unknown time  . mirabegron ER (MYRBETRIQ) 50 MG TB24 tablet Take 1 tablet (50 mg total) by mouth daily. 30 tablet 11 06/06/2018 at Unknown time  . modafinil (PROVIGIL) 200 MG tablet Take 1 tablet (200 mg total) by mouth 2 (two) times daily. (Patient  taking differently: Take 200 mg by mouth daily. ) 180 tablet 1 06/06/2018 at Unknown time  . mometasone (ELOCON) 0.1 % cream    06/06/2018 at Unknown time  . Polyethyl Glycol-Propyl Glycol (SYSTANE OP) Place 1 drop into both eyes 2 (two) times daily as needed (dry eyes).   06/06/2018 at Unknown time  . potassium chloride SA (K-DUR,KLOR-CON) 20 MEQ tablet Take 1 tablet (20 mEq total) by mouth daily. 90 tablet 3 Past Week at Unknown time  . rosuvastatin (CRESTOR) 10 MG tablet TAKE ONE TABLET EVERY DAY 90 tablet 1 06/06/2018 at Unknown time  .  sertraline (ZOLOFT) 50 MG tablet Take 50 mg by mouth daily.   06/06/2018 at Unknown time  . VENTOLIN HFA 108 (90 Base) MCG/ACT inhaler TAKE 2 PUFFS EVERY 8 HOURS AS NEEDED FORWHEEZING 18 g 3 06/06/2018 at Unknown time  . albuterol (PROVENTIL) (2.5 MG/3ML) 0.083% nebulizer solution Take 3 mLs (2.5 mg total) by nebulization every 4 (four) hours as needed for wheezing or shortness of breath. (Patient taking differently: Take 2.5 mg by nebulization as needed for wheezing or shortness of breath. ) 75 mL 12 Taking  . losartan (COZAAR) 25 MG tablet Take 1 tablet (25 mg total) by mouth daily. 30 tablet 1 Taking     Allergies  Allergen Reactions  . Pravastatin Other (See Comments)  . Prednisone Other (See Comments)    Pt states that med makes him hyper Pt states that med makes him hyper     Past Medical History:  Diagnosis Date  . Atherosclerosis of abdominal aorta ([place])   . CAD (coronary artery disease)   . Cervical spondylosis 10/01/2013  . Chronic diastolic CHF (congestive heart failure) ([place])    a. 07/2016 Echo: >55%; b. 10/2016 Echo: EF 55-60%, Gr1 DD, Ao sclerosis w/o stenosis, sev dil LA; c. 08/2017 Echo: EF 60-65%, no rwma, Gr2 DD, mild AS, sev dil LA/RA.  [name] Coronary artery disease    a. 1998 s/p mini-cabg @ [facility] - LIMA->LAD;  b. 07/2016 St Echo: Inadequate HR w/ HTN response;  c.  08/2016 MV: EF 67%, no ischemia; d. 10/2016 NSTEMI/Cath: RCA 95p (4.0x26 Onyx DES), LIMA->LAD nl; e. 09/2017 Cath: LM 40/30, LAD 100ost, RI 80, LCX nl, OM2/3 nl, RCA patent stent, 4m, LIMA->LAD nl-->Med Rx.  . DDD (degenerative disc disease), cervical   . DDD (degenerative disc disease), lumbar   . Depression   . GERD (gastroesophageal reflux disease)   . Hyperlipidemia   . Hypertension   . Hypothyroidism   . PAF (paroxysmal atrial fibrillation) ([facility])    a. s/p DCCV-->maintaining sinus on amiodarone;  b. CHA2DS2VASc = 5-->eliquis.  . Parkinson's disease ([place])    tremors  . Pleural effusion, right    a. 09/2017  s/p thoracentesis.  [name] PNA (pneumonia) 08/26/2017  . Pulmonary embolism ([place]) 2011  . Pulmonary fibrosis ([place])   . 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 Sleep apnea    wears CPAP  . Thrombocytopenia ([place])     Review of systems:      Physical Exam    Heart and lungs: The rate and rhythm without rub or gallop, lungs are bilaterally clear.    HEENT: Normocephalic atraumatic eyes are anicteric    Other:    Pertinant exam for procedure: Protuberant, obese, bowel sounds positive normoactive.  Nontender nondistended, soft    Planned proceedures: Colonoscopy and indicated procedures. I have discussed the risks benefits and complications of procedures to include not limited to bleeding, infection, perforation and  the risk of sedation and the patient wishes to proceed.    Lollie Sails, MD Gastroenterology 06/07/2018  9:26 AM

## 2018-06-07 NOTE — Anesthesia Post-op Follow-up Note (Signed)
Anesthesia QCDR form completed.        

## 2018-06-08 ENCOUNTER — Ambulatory Visit
Admission: RE | Admit: 2018-06-08 | Discharge: 2018-06-08 | Disposition: A | Discharge status: Home / Self Care | Payer: PPO | Source: Ambulatory Visit | Attending: Pulmonary Disease | Admitting: Pulmonary Disease

## 2018-06-08 ENCOUNTER — Other Ambulatory Visit: Payer: Self-pay | Admitting: Family Medicine

## 2018-06-08 ENCOUNTER — Encounter: Payer: Self-pay | Admitting: Gastroenterology

## 2018-06-08 DIAGNOSIS — J849 Interstitial pulmonary disease, unspecified: Secondary | ICD-10-CM | POA: Insufficient documentation

## 2018-06-08 DIAGNOSIS — J439 Emphysema, unspecified: Secondary | ICD-10-CM | POA: Diagnosis not present

## 2018-06-08 LAB — SURGICAL PATHOLOGY

## 2018-06-08 NOTE — Anesthesia Postprocedure Evaluation (Signed)
Anesthesia Post Note  Patient: Andre [name]  Procedure(s) Performed: COLONOSCOPY WITH PROPOFOL (N/A )  Patient location during evaluation: PACU Anesthesia Type: General Level of consciousness: awake and alert Pain management: pain level controlled Vital Signs Assessment: post-procedure vital signs reviewed and stable Respiratory status: spontaneous breathing, nonlabored ventilation, respiratory function stable and patient connected to nasal cannula oxygen Cardiovascular status: blood pressure returned to baseline and stable Postop Assessment: no apparent nausea or vomiting Anesthetic complications: no     Last Vitals:  Vitals:   06/07/18 1141 06/07/18 1151  BP: 140/66 (!) 138/53  Pulse:    Resp:    Temp:    SpO2:      Last Pain:  Vitals:   06/07/18 1151  TempSrc:   PainSc: 0-No pain                 Molli Barrows

## 2018-06-11 ENCOUNTER — Other Ambulatory Visit: Payer: Self-pay | Admitting: Cardiovascular Disease

## 2018-06-12 ENCOUNTER — Telehealth: Payer: Self-pay

## 2018-06-12 ENCOUNTER — Ambulatory Visit: Payer: PPO | Admitting: Pulmonary Disease

## 2018-06-12 ENCOUNTER — Telehealth: Payer: Self-pay | Admitting: Pulmonary Disease

## 2018-06-12 ENCOUNTER — Ambulatory Visit (INDEPENDENT_AMBULATORY_CARE_PROVIDER_SITE_OTHER): Payer: PPO | Admitting: Pulmonary Disease

## 2018-06-12 ENCOUNTER — Encounter: Payer: Self-pay | Admitting: Pulmonary Disease

## 2018-06-12 VITALS — BP 136/80 | HR 64 | Ht 67.0 in | Wt 262.0 lb

## 2018-06-12 DIAGNOSIS — J849 Interstitial pulmonary disease, unspecified: Secondary | ICD-10-CM

## 2018-06-12 DIAGNOSIS — G4733 Obstructive sleep apnea (adult) (pediatric): Secondary | ICD-10-CM

## 2018-06-12 DIAGNOSIS — J449 Chronic obstructive pulmonary disease, unspecified: Secondary | ICD-10-CM

## 2018-06-12 LAB — PULMONARY FUNCTION TEST
DL/VA % pred: 109 %
DL/VA: 4.33 ml/min/mmHg/L
DLCO unc % pred: 73 %
DLCO unc: 16.34 ml/min/mmHg
FEF 25-75 Post: 1.1 L/sec
FEF 25-75 Pre: 0.92 L/sec
FEF2575-%Change-Post: 19 %
FEF2575-%Pred-Post: 64 %
FEF2575-%Pred-Pre: 54 %
FEV1-%Change-Post: 4 %
FEV1-%Pred-Post: 66 %
FEV1-%Pred-Pre: 63 %
FEV1-Post: 1.66 L
FEV1-Pre: 1.59 L
FEV1FVC-%Change-Post: 0 %
FEV1FVC-%Pred-Pre: 97 %
FEV6-%Change-Post: 4 %
FEV6-%Pred-Post: 71 %
FEV6-%Pred-Pre: 68 %
FEV6-Post: 2.34 L
FEV6-Pre: 2.23 L
FEV6FVC-%Change-Post: 0 %
FEV6FVC-%Pred-Post: 106 %
FEV6FVC-%Pred-Pre: 105 %
FVC-%Change-Post: 4 %
FVC-%Pred-Post: 67 %
FVC-%Pred-Pre: 64 %
FVC-Post: 2.38 L
FVC-Pre: 2.29 L
Post FEV1/FVC ratio: 70 %
Post FEV6/FVC ratio: 98 %
Pre FEV1/FVC ratio: 69 %
Pre FEV6/FVC Ratio: 98 %
RV % pred: 66 %
RV: 1.67 L
TLC % pred: 61 %
TLC: 3.95 L

## 2018-06-12 MED ORDER — UMECLIDINIUM-VILANTEROL 62.5-25 MCG/INH IN AEPB: 1.0000 | INHALATION_SPRAY | Freq: Every day | RESPIRATORY_TRACT | 0 refills | Status: AC

## 2018-06-12 MED ORDER — UMECLIDINIUM-VILANTEROL 62.5-25 MCG/INH IN AEPB: 1.0000 | INHALATION_SPRAY | Freq: Every day | RESPIRATORY_TRACT | 5 refills | Status: AC

## 2018-06-12 NOTE — Progress Notes (Signed)
Andre [name]    [id number]    Aug 27, 1935  Primary Care Physician:Sonnenberg, Angela Adam, MD  Referring Physician: [name], MD [address] [place], [place] [address]  Chief complaint: Follow-up for pulmonary fibrosis, sleep apnea.  HPI: 83 year old with history of pulmonary fibrosis, recurrent respiratory failure, eosinophilic pleural effusion, chronic diastolic heart failure, sleep apnea, Parkinson's  Previously followed [facility].  He was also evaluated at [facility] for pulmonary fibrosis thought to be secondary to prior asbestos exposure.  Recommendation was made to follow-up with a CT in January 2020.  However he cannot keep the appointment due to high co-pay and wishes to transfer care to [facility].  He has had at least 3 admissions over the past year for recurrent respiratory failure, HCAP, right pleural effusion status post thoracentesis on 09/19/2017 with cell count showing exudate with elevated eosinophils.   He used to remain active until age 38 when he had a CABG.  Has run 14 marathons in the past.  After his CABG he took up long distance cycling which he had to give up about 10 years ago.  Pets: No pets Occupation: Worked as a Actor ILD questionnaire 05/15/2018: Exposure to asbestos, no other significant exposure. Smoking history:20-pack-year smoker.  Quit smoking in 1985 Travel history: No significant recent travel Relevant family history: No family history of lung disease.  Interim History States that breathing is stable.  He has chronic dyspnea on exertion, chronic cough with white mucus No change since last visit He is here for review of CT and PFTs.  Outpatient Encounter Medications as of 06/12/2018  Medication Sig  . albuterol (PROVENTIL) (2.5 MG/3ML) 0.083% nebulizer solution Take 3 mLs (2.5 mg total) by nebulization every 4 (four) hours as needed for wheezing or shortness of breath. (Patient taking differently: Take  2.5 mg by nebulization as needed for wheezing or shortness of breath. )  . carbidopa-levodopa (SINEMET IR) 25-250 MG tablet TAKE TWO TABLETS 3 TIMES DAILY (Patient taking differently: 1.5 tablets 3 (three) times daily. )  . carvedilol (COREG) 3.125 MG tablet Take 2 tablets (6.25 mg total) by mouth 2 (two) times daily with a meal. (Patient taking differently: Take 3.125 mg by mouth 2 (two) times daily with a meal. 2 tablets BID)  . cyclobenzaprine (FLEXERIL) 5 MG tablet Take 5 mg by mouth daily as needed for muscle spasms.   [name] ELIQUIS 5 MG TABS tablet TAKE ONE TABLET BY MOUTH TWICE DAILY  . entacapone (COMTAN) 200 MG tablet Take 1 tablet (200 mg total) by mouth 3 (three) times daily.  [name] escitalopram (LEXAPRO) 10 MG tablet TAKE ONE TABLET EVERY DAY  . esomeprazole (NEXIUM) 20 MG capsule Take 48 mg by mouth daily at 12 noon.   . fluticasone (FLONASE) 50 MCG/ACT nasal spray TAKE 2 PUFFS IN EACH NOSTRIL EVERY DAY (Patient taking differently: TAKE 2 PUFFS IN EACH NOSTRIL EVERY DAY, takes as prn)  . furosemide (LASIX) 20 MG tablet TAKE TWO TABLETS BY MOUTH TWICE DAILY  . HYDROcodone-acetaminophen (NORCO/VICODIN) 5-325 MG tablet Take by mouth.  . isosorbide mononitrate (IMDUR) 30 MG 24 hr tablet Take 1 tablet (30 mg total) by mouth daily.  [name] ketoconazole (NIZORAL) 2 % cream   . levothyroxine (SYNTHROID, LEVOTHROID) 50 MCG tablet TAKE 1 TABLET BY MOUTH ONCE DAILY. TAKE ON EMPTY STOMACH WITH A GLASSOF WATER AT LEAST 30-60 MINUTES BEFORE BREAKFAST  . losartan (COZAAR) 25 MG tablet TAKE ONE TABLET BY MOUTH  EVERY DAY  . mirabegron ER (MYRBETRIQ) 50 MG TB24 tablet Take 1 tablet (50 mg total) by mouth daily.  . modafinil (PROVIGIL) 200 MG tablet Take 1 tablet (200 mg total) by mouth 2 (two) times daily. (Patient taking differently: Take 200 mg by mouth daily. )  . mometasone (ELOCON) 0.1 % cream   . Polyethyl Glycol-Propyl Glycol (SYSTANE OP) Place 1 drop into both eyes 2 (two) times daily as needed (dry eyes).    . potassium chloride SA (K-DUR,KLOR-CON) 20 MEQ tablet Take 1 tablet (20 mEq total) by mouth daily.  . rosuvastatin (CRESTOR) 10 MG tablet TAKE ONE TABLET EVERY DAY  . sertraline (ZOLOFT) 50 MG tablet Take 50 mg by mouth daily.  . VENTOLIN HFA 108 (90 Base) MCG/ACT inhaler TAKE 2 PUFFS EVERY 8 HOURS AS NEEDED FORWHEEZING  . [DISCONTINUED] losartan (COZAAR) 25 MG tablet Take 25 mg by mouth daily.   No facility-administered encounter medications on file as of 06/12/2018.    Physical Exam: Blood pressure 136/80, pulse 64, height 5\' 7"  (1.702 m), weight 262 lb (118.8 kg), SpO2 94 %. Gen:      No acute distress HEENT:  EOMI, sclera anicteric Neck:     No masses; no thyromegaly Lungs:    Clear to auscultation bilaterally; normal respiratory effort CV:         Regular rate and rhythm; no murmurs Abd:      + bowel sounds; soft, non-tender; no palpable masses, no distension Ext:    No edema; adequate peripheral perfusion Skin:      Warm and dry; no rash Neuro: alert and oriented x 3 Psych: normal mood and affect  Data Reviewed: Imaging: CT chest 12/06/2011- peripheral and basal fibrotic changes, calcified pleural plaques.  CT chest 06/09/2015- peripheral and basal fibrotic changes with no honeycombing.  Calcified pleural plaques  CT high-resolution 05/30/2017- moderate centrilobular emphysema, mild basilar subpleural reticulation, groundglass and traction bronchiectasis.  No honeycombing.  Calcified granuloma in the right upper lobe, calcified pleural plaques.  CT high-resolution 06/08/2018- moderate emphysema, mild basilar reticulation, traction bronchiectasis.  No honeycombing, calcified pleural plaques with right pleural thickening.  Stable calcified granuloma.  I have reviewed the images personally. I have reviewed the images personally.  CT chest high-resolution [[facility]] 01/22/2018 1. Primarily basilar peripheral interlobular septal thickening. Findings may represent changes of chronic edema  vs early fibrosis. New small right and trace left pleural effusions with associated calcified pleural plaques. Correlate with history of asbestos exposure.  2. Apparent increased thickening of the right basilar pleura, incompletely assessed. Consider PET/CT or pleural fluid sampling for further evaluation if not already obtained. Differential considerations include fibrosis or malignancy such as mesothelioma. 3. Aortic valve calcifications.  4. Scattered less than 3 mm pulmonary nodules. If the patient is low risk for lung cancer, no further follow-up is recommended. If the patient is high risk for lung cancer, consider 12 month follow-up CT. (2017 Fleischner Guidelines)  PFTs: 09/25/2012 FVC 2.52 [69%], FEV1 1.95 [75%), F/F 77, TLC 72%, DLCO 70% Moderate obstructive airways, minimal restriction and diffusion defect.  06/12/2018 FVC 2.38 [16%], FEV1 1.66 [66%], F/F 70, TLC 3.95 (61%), DLCO 16.34 [73%]  Labs: ANA 01/22/2018- negative, CCP-negative  Pleural fluid 09/19/2017- LDH 462, total protein 3.9 WBC 3661, 59% eos, 27% lymphs, 10% neutrophils Cytology-mixed reactive and inflammatory cells.  No malignancy.  Cardiac Echocardiogram 08/28/2017- LVEF 37-62%, grade 2 diastolic dysfunction, mild aortic stenosis.  Severe dilatation of left atrium and right atrium, trivial pericardial effusion.  Cardiac catheterization 09/18/2017 Significant three-vessel coronary artery disease with patent grafts.  Right heart catheterization shows mildly elevated filling pressures, minimal pulmonary hypertension Wedge 12, PA pressure 39/17 (24), cardiac output 5.08 L/min PVR 189, 2.4 Wood units  Sleep CPAP titration 11/04/2015- CPAP titrated to 12 cm of water.  Download 04/15/2018-05/14/2018 Set pressure 12 cm of water 100% usage greater than 4 hours Residual AHI 1.2  Assessment:  Pulmonary fibrosis, asbestosis CT scan reviewed with basilar fibrosis, pleural calcifications consistent with asbestos  exposure The fibrotic changes have remained stable since 2013 Continue annual monitoring.  No need for anti-fibrotic treatment at present.  Emphysema COPD PFTs reviewed with moderate obstruction.  Will trial Anoro inhaler.  Dyspnea Multifactorial from obesity, diastolic heart failure, coronary artery disease Suspect anxiety is a significant component to his episodes of dyspnea Advised to continue weight loss and exercise. He has been referred to pulmonary rehab.  Right pleural effusion exudative, eosinophilic S.p thoracentesis dring hospitalization in 2019.  The cell count is eosinophilic which could be from pneumonia versus asbestos-related effusion Follow-up CT shows thickening of the pleura in that area We will need to monitor this area for possible mesothelioma.   Obstructive sleep apnea Stable on CPAP Reviewed download-shows good compliance  Health maintenance 12/29/2017-influenza 05/10/2016-Prevnar  Plan/Recommendations: - Start Anoro - Weight loss with diet and exercise - Pulmonary rehab  Marshell Garfinkel MD [facility] 06/12/2018, 1:39 PM  CC: [name], MD

## 2018-06-12 NOTE — Telephone Encounter (Signed)
Spoke with pt's wife and gave message below. She understood and stated she would take pt to the pharmacy to receive injection. Nothing further is needed.

## 2018-06-12 NOTE — Telephone Encounter (Signed)
Pharmacy sent a fax, stating pt got the medication Losartan from the hospital and he needs a refill, if this is possible.  Can you write this.

## 2018-06-12 NOTE — Patient Instructions (Signed)
Reviewed his CT scan and PFTs.  It shows stable lung scarring from prior asbestos exposure There is also evidence of moderate COPD We will start an inhaler called Anoro Make sure you call the pulmonary rehab and schedule your appointment Continue using the CPAP Follow-up in 6 months.

## 2018-06-12 NOTE — Progress Notes (Signed)
PFT done today. 

## 2018-06-12 NOTE — Telephone Encounter (Signed)
Contacted pt's PCP and [facility] to obtain PNA 23 vaccine date.  It does not appear that pt has had PNA vaccine.  Would pt like to come in for PNA vaccine, go to PCP or pharmacy.  lmtcb x1 for pt.

## 2018-06-13 NOTE — Telephone Encounter (Signed)
It appears cardiology sent this into his pharmacy.  Please check with the patient to see if he was able to pick this up.

## 2018-06-14 NOTE — Telephone Encounter (Signed)
Called and lmtcb and informed the pt that the medication was ent to pharmacy.  Gae Bon, CMA

## 2018-06-25 DIAGNOSIS — G4733 Obstructive sleep apnea (adult) (pediatric): Secondary | ICD-10-CM | POA: Diagnosis not present

## 2018-06-25 DIAGNOSIS — J441 Chronic obstructive pulmonary disease with (acute) exacerbation: Secondary | ICD-10-CM | POA: Diagnosis not present

## 2018-06-25 DIAGNOSIS — J45909 Unspecified asthma, uncomplicated: Secondary | ICD-10-CM | POA: Diagnosis not present

## 2018-06-25 DIAGNOSIS — J449 Chronic obstructive pulmonary disease, unspecified: Secondary | ICD-10-CM | POA: Diagnosis not present

## 2018-06-28 ENCOUNTER — Other Ambulatory Visit: Payer: Self-pay | Admitting: Family Medicine

## 2018-07-02 DIAGNOSIS — L570 Actinic keratosis: Secondary | ICD-10-CM | POA: Diagnosis not present

## 2018-07-02 DIAGNOSIS — L820 Inflamed seborrheic keratosis: Secondary | ICD-10-CM | POA: Diagnosis not present

## 2018-07-02 DIAGNOSIS — L821 Other seborrheic keratosis: Secondary | ICD-10-CM | POA: Diagnosis not present

## 2018-07-02 DIAGNOSIS — Z1283 Encounter for screening for malignant neoplasm of skin: Secondary | ICD-10-CM | POA: Diagnosis not present

## 2018-07-02 DIAGNOSIS — D2230 Melanocytic nevi of unspecified part of face: Secondary | ICD-10-CM | POA: Diagnosis not present

## 2018-07-02 DIAGNOSIS — L812 Freckles: Secondary | ICD-10-CM | POA: Diagnosis not present

## 2018-07-02 DIAGNOSIS — Z85828 Personal history of other malignant neoplasm of skin: Secondary | ICD-10-CM | POA: Diagnosis not present

## 2018-07-02 DIAGNOSIS — L578 Other skin changes due to chronic exposure to nonionizing radiation: Secondary | ICD-10-CM | POA: Diagnosis not present

## 2018-07-02 DIAGNOSIS — D225 Melanocytic nevi of trunk: Secondary | ICD-10-CM | POA: Diagnosis not present

## 2018-07-02 DIAGNOSIS — L304 Erythema intertrigo: Secondary | ICD-10-CM | POA: Diagnosis not present

## 2018-07-02 DIAGNOSIS — D229 Melanocytic nevi, unspecified: Secondary | ICD-10-CM | POA: Diagnosis not present

## 2018-07-09 ENCOUNTER — Telehealth: Payer: Self-pay | Admitting: Family Medicine

## 2018-07-09 ENCOUNTER — Ambulatory Visit (INDEPENDENT_AMBULATORY_CARE_PROVIDER_SITE_OTHER): Payer: PPO | Admitting: Family Medicine

## 2018-07-09 ENCOUNTER — Encounter: Payer: Self-pay | Admitting: Family Medicine

## 2018-07-09 ENCOUNTER — Other Ambulatory Visit: Payer: Self-pay

## 2018-07-09 VITALS — BP 120/64 | HR 65 | Temp 97.4°F | Ht 67.0 in | Wt 262.8 lb

## 2018-07-09 DIAGNOSIS — F419 Anxiety disorder, unspecified: Secondary | ICD-10-CM | POA: Diagnosis not present

## 2018-07-09 DIAGNOSIS — F32A Depression, unspecified: Secondary | ICD-10-CM

## 2018-07-09 DIAGNOSIS — F329 Major depressive disorder, single episode, unspecified: Secondary | ICD-10-CM | POA: Diagnosis not present

## 2018-07-09 DIAGNOSIS — Z6841 Body Mass Index (BMI) 40.0 and over, adult: Secondary | ICD-10-CM | POA: Diagnosis not present

## 2018-07-09 DIAGNOSIS — I10 Essential (primary) hypertension: Secondary | ICD-10-CM

## 2018-07-09 DIAGNOSIS — J449 Chronic obstructive pulmonary disease, unspecified: Secondary | ICD-10-CM | POA: Diagnosis not present

## 2018-07-09 DIAGNOSIS — J069 Acute upper respiratory infection, unspecified: Secondary | ICD-10-CM | POA: Diagnosis not present

## 2018-07-09 LAB — BASIC METABOLIC PANEL
BUN: 20 mg/dL (ref 6–23)
CO2: 28 mEq/L (ref 19–32)
Calcium: 9.1 mg/dL (ref 8.4–10.5)
Chloride: 101 mEq/L (ref 96–112)
Creatinine, Ser: 0.87 mg/dL (ref 0.40–1.50)
GFR: 84.29 mL/min (ref >60.00)
Glucose, Bld: 142 mg/dL — ABNORMAL HIGH (ref 70–99)
Potassium: 3.8 mEq/L (ref 3.5–5.1)
Sodium: 137 mEq/L (ref 135–145)

## 2018-07-09 MED ORDER — ESCITALOPRAM OXALATE 20 MG PO TABS: 20.0000 mg | ORAL_TABLET | Freq: Every day | ORAL | 1 refills | Status: DC

## 2018-07-09 NOTE — Patient Instructions (Signed)
Nice to see you. We will increase your Lexapro. We will get lab work today and contact you with results. If your anxiety is not improving please let us know. Please continue with your treatment through pulmonology.  Please work on dietary changes as outlined below.  Diet Recommendations  Starchy (carb) foods: Bread, rice, pasta, potatoes, corn, cereal, grits, crackers, bagels, muffins, all baked goods.  (Fruits, milk, and yogurt also have carbohydrate, but most of these foods will not spike your blood sugar as the starchy foods will.)  A few fruits do cause high blood sugars; use small portions of bananas (limit to 1/2 at a time), grapes, watermelon, oranges, and most tropical fruits.    Protein foods: Meat, fish, poultry, eggs, dairy foods, and beans such as pinto and kidney beans (beans also provide carbohydrate).   1. Eat at least 3 meals and 1-2 snacks per day. Never go more than 4-5 hours while awake without eating. Eat breakfast within the first hour of getting up.   2. Limit starchy foods to TWO per meal and ONE per snack. ONE portion of a starchy  food is equal to the following:   - ONE slice of bread (or its equivalent, such as half of a hamburger bun).   - 1/2 cup of a "scoopable" starchy food such as potatoes or rice.   - 15 grams of carbohydrate as shown on food label.  3. Include at every meal: a protein food, a carb food, and vegetables and/or fruit.   - Obtain twice the volume of veg's as protein or carbohydrate foods for both lunch and dinner.   - Fresh or frozen veg's are best.   - Keep frozen veg's on hand for a quick vegetable serving.

## 2018-07-09 NOTE — Assessment & Plan Note (Signed)
Encouraged activity.  Given dietary instructions.

## 2018-07-09 NOTE — Progress Notes (Signed)
[name], MD Phone: [phone]  Andre [name] is a 83 y.o. male who presents today for f/u.  Obesity: Patient is not exercising much.  He notes he is eating a little bit of everything though nothing to excess.  Watching his salt intake.  Eating low-fat.  COPD: Patient is following with pulmonology.  He does have pulmonary fibrosis.  He was started on Anoro and notes this has done quite well for him.  His dyspnea on exertion has improved.  He notes rare cough.  No chest pain.  Hypertension: Not checking blood pressures at home.  He is taking losartan, carvedilol, and Imdur.  No chest pain.  Dyspnea has improved and is related to COPD and lung issues.  No edema.  Anxiety: Notes he always has this.  Notes he is generally anxious.  Notes anything can set it off.  Notes it is a little bit better.  He notes no depression.  He worries a lot about money.  Social History   Tobacco Use  Smoking Status Former Smoker  . Packs/day: 1.00  . Years: 10.00  . Pack years: 10.00  . Types: Cigarettes, Pipe, Cigars  . Last attempt to quit: 04/18/1972  . Years since quitting: 46.2  Smokeless Tobacco Former Systems developer  . Types: Chew  . Quit date: 04/18/1972     ROS see history of present illness  Objective  Physical Exam Vitals:   07/09/18 1107  BP: 120/64  Pulse: 65  Temp: (!) 97.4 F (36.3 C)  SpO2: 95%    BP Readings from Last 3 Encounters:  07/09/18 120/64  06/12/18 136/80  06/07/18 (!) 138/53   Wt Readings from Last 3 Encounters:  07/09/18 262 lb 12.8 oz (119.2 kg)  06/12/18 262 lb (118.8 kg)  06/07/18 260 lb (117.9 kg)    Physical Exam Constitutional:      General: He is not in acute distress.    Appearance: He is not diaphoretic.  Cardiovascular:     Rate and Rhythm: Normal rate and regular rhythm.     Heart sounds: Normal heart sounds.  Pulmonary:     Effort: Pulmonary effort is normal.     Breath sounds: Normal breath sounds.  Musculoskeletal:     Right lower leg:  No edema.     Left lower leg: No edema.  Skin:    General: Skin is warm and dry.  Neurological:     Mental Status: He is alert.      Assessment/Plan: Please see individual problem list.  Essential hypertension Well-controlled.  Continue current regimen.  Check BMP.  COPD (chronic obstructive pulmonary disease) (HCC) Improved on Anoro.  He will continue this.  He will continue to follow with pulmonology.  I discussed that he needs to try to stay away from his many people as possible given the current issue with coronavirus.  Discussed good hand hygiene.  BMI 40.0-44.9, adult [facility]) Encouraged activity.  Given dietary instructions.  Anxiety and depression Patient denies depression.  He does note some anxiety.  We will increase his Lexapro.   Health Maintenance: I will have the CMA contact the patient's pharmacy to check on the status of his Pneumovax.  Orders Placed This Encounter  Procedures  . Basic Metabolic Panel (BMET)    Meds ordered this encounter  Medications  . escitalopram (LEXAPRO) 20 MG tablet    Sig: Take 1 tablet (20 mg total) by mouth daily.    Dispense:  90 tablet    Refill:  1  [name], MD [place]

## 2018-07-09 NOTE — Assessment & Plan Note (Signed)
Patient denies depression.  He does note some anxiety.  We will increase his Lexapro.

## 2018-07-09 NOTE — Telephone Encounter (Signed)
Please contact the patients pharmacy to see if he has had the Pneumovax there. Thanks.

## 2018-07-09 NOTE — Assessment & Plan Note (Signed)
Improved on Anoro.  He will continue this.  He will continue to follow with pulmonology.  I discussed that he needs to try to stay away from his many people as possible given the current issue with coronavirus.  Discussed good hand hygiene.

## 2018-07-09 NOTE — Telephone Encounter (Signed)
Called pt and left a detailed VM to get Pneumococcal 23 vaccination at his local pharmacy.

## 2018-07-09 NOTE — Assessment & Plan Note (Signed)
Well-controlled.  Continue current regimen.  Check BMP. 

## 2018-07-09 NOTE — Telephone Encounter (Signed)
[facility] the only thing they have is the PREV-13 and NOT Pneumococcal 23. PREV-13 was done in 2018 which has been added already in pt's chart.   Sent to PCP as an [place]

## 2018-07-09 NOTE — Telephone Encounter (Signed)
Noted. Please let the patient know this. He should have the pneumovax at his pharmacy.

## 2018-07-18 ENCOUNTER — Other Ambulatory Visit: Payer: Self-pay

## 2018-07-18 ENCOUNTER — Other Ambulatory Visit: Payer: PPO

## 2018-07-18 DIAGNOSIS — R310 Gross hematuria: Secondary | ICD-10-CM

## 2018-07-20 ENCOUNTER — Other Ambulatory Visit: Payer: Self-pay | Admitting: Cardiovascular Disease

## 2018-07-20 ENCOUNTER — Other Ambulatory Visit: Payer: Self-pay | Admitting: Family Medicine

## 2018-07-20 NOTE — Telephone Encounter (Signed)
Pt is requesting a refill on levothyroxine, do you want him to have a lab appt before refilling this medication.  Please advise.  Leoma Folds,cma

## 2018-07-20 NOTE — Telephone Encounter (Signed)
Please review for refill. Thank!  

## 2018-07-23 ENCOUNTER — Ambulatory Visit: Payer: PPO | Admitting: Urology

## 2018-07-25 ENCOUNTER — Other Ambulatory Visit: Payer: Self-pay

## 2018-07-25 ENCOUNTER — Telehealth (INDEPENDENT_AMBULATORY_CARE_PROVIDER_SITE_OTHER): Payer: PPO | Admitting: Urology

## 2018-07-25 DIAGNOSIS — R310 Gross hematuria: Secondary | ICD-10-CM

## 2018-07-25 NOTE — Progress Notes (Signed)
Virtual Visit via Telephone Note  I connected with Andre [name] on 07/25/18 at 10:00 AM EDT by telephone and verified that I am speaking with the correct person using two identifiers.   I discussed the limitations, risks, security and privacy concerns of performing an evaluation and management service by telephone and the availability of in person appointments. We discussed the impact of the COVID-19 on the healthcare system, and the importance of social distancing and reducing patient and provider exposure. I also discussed with the patient that there may be a patient responsible charge related to this service. The patient expressed understanding and agreed to proceed.  Reason for visit: Annual follow-up  History of Present Illness: 83 year old male presents for a six-month follow-up.  He was last seen October 2019 for cystoscopy after an episode of total gross painless hematuria.  Upper tract imaging showed a large simple cyst of the left kidney.  Cystoscopy was remarkable for BPH with hypervascularity.  He denies recurrent hematuria.  He denies flank/abdominal/pelvic or scrotal pain.  He remains on Myrbetriq for storage related voiding symptoms which are stable.  Assessment and Plan: 83 year old male with a remote history of gross hematuria without recurrence.  Voiding pattern is stable on Myrbetriq.  Follow Up: Follow-up annually and he was instructed to call earlier for any significant change in his symptoms.   I discussed the assessment and treatment plan with the patient. The patient was provided an opportunity to ask questions and all were answered. The patient agreed with the plan and demonstrated an understanding of the instructions.   The patient was advised to call back or seek an in-person evaluation if the symptoms worsen or if the condition fails to improve as anticipated.  I provided 7 minutes of non-face-to-face time during this encounter.   Abbie Sons, MD

## 2018-07-26 DIAGNOSIS — G4733 Obstructive sleep apnea (adult) (pediatric): Secondary | ICD-10-CM | POA: Diagnosis not present

## 2018-07-26 DIAGNOSIS — J45909 Unspecified asthma, uncomplicated: Secondary | ICD-10-CM | POA: Diagnosis not present

## 2018-07-26 DIAGNOSIS — J441 Chronic obstructive pulmonary disease with (acute) exacerbation: Secondary | ICD-10-CM | POA: Diagnosis not present

## 2018-07-26 DIAGNOSIS — J449 Chronic obstructive pulmonary disease, unspecified: Secondary | ICD-10-CM | POA: Diagnosis not present

## 2018-08-05 DIAGNOSIS — G4733 Obstructive sleep apnea (adult) (pediatric): Secondary | ICD-10-CM | POA: Diagnosis not present

## 2018-08-05 DIAGNOSIS — J449 Chronic obstructive pulmonary disease, unspecified: Secondary | ICD-10-CM | POA: Diagnosis not present

## 2018-08-05 DIAGNOSIS — J45909 Unspecified asthma, uncomplicated: Secondary | ICD-10-CM | POA: Diagnosis not present

## 2018-08-05 DIAGNOSIS — J441 Chronic obstructive pulmonary disease with (acute) exacerbation: Secondary | ICD-10-CM | POA: Diagnosis not present

## 2018-08-16 ENCOUNTER — Ambulatory Visit: Admission: RE | Admit: 2018-08-16 | Payer: PPO | Source: Home / Self Care | Admitting: Gastroenterology

## 2018-08-16 ENCOUNTER — Encounter: Admission: RE | Payer: Self-pay | Source: Home / Self Care

## 2018-08-16 DIAGNOSIS — M47812 Spondylosis without myelopathy or radiculopathy, cervical region: Secondary | ICD-10-CM | POA: Diagnosis not present

## 2018-08-16 SURGERY — ESOPHAGOGASTRODUODENOSCOPY (EGD) WITH PROPOFOL
Anesthesia: General

## 2018-08-20 ENCOUNTER — Other Ambulatory Visit: Payer: Self-pay | Admitting: Neurology

## 2018-08-23 ENCOUNTER — Telehealth: Payer: Self-pay

## 2018-08-23 NOTE — Telephone Encounter (Signed)
Link for visit has been sent via e-mail.

## 2018-08-23 NOTE — Telephone Encounter (Signed)
°  08-23-2018 Pt wife has called, she gave verbal consent to file insurance for [email or web address] pt email address confirmed as [email or web address]  Pt understands that although there may be some limitations with this type of visit, we will take all precautions to reduce any security or privacy concerns.  Pt understands that this will be treated like an in office visit and we will file with pt's insurance, and there may be a patient responsible charge related to this service.

## 2018-08-23 NOTE — Telephone Encounter (Signed)
I contacted the pt's wife and updated the medication, allergies and PMH for 08/27/18 visit.

## 2018-08-23 NOTE — Telephone Encounter (Signed)
I contacted the pt's daughter ( pat ok per dpr) to discuss appt on 08/27/18. Trying to verify if we could do a virtual video visit.

## 2018-08-25 DIAGNOSIS — J449 Chronic obstructive pulmonary disease, unspecified: Secondary | ICD-10-CM | POA: Diagnosis not present

## 2018-08-25 DIAGNOSIS — G4733 Obstructive sleep apnea (adult) (pediatric): Secondary | ICD-10-CM | POA: Diagnosis not present

## 2018-08-25 DIAGNOSIS — J45909 Unspecified asthma, uncomplicated: Secondary | ICD-10-CM | POA: Diagnosis not present

## 2018-08-25 DIAGNOSIS — J441 Chronic obstructive pulmonary disease with (acute) exacerbation: Secondary | ICD-10-CM | POA: Diagnosis not present

## 2018-08-27 ENCOUNTER — Ambulatory Visit (INDEPENDENT_AMBULATORY_CARE_PROVIDER_SITE_OTHER): Payer: PPO | Admitting: Neurology

## 2018-08-27 ENCOUNTER — Encounter: Payer: Self-pay | Admitting: Neurology

## 2018-08-27 ENCOUNTER — Other Ambulatory Visit: Payer: Self-pay

## 2018-08-27 DIAGNOSIS — G20 Parkinson's disease: Secondary | ICD-10-CM

## 2018-08-27 NOTE — Progress Notes (Signed)
     Virtual Visit via Telephone Note  I connected with Andre [name] on 08/27/18 at 12:00 PM EDT by telephone and verified that I am speaking with the correct person using two identifiers.  Location: Patient: The patient is at home. Provider: Physician in office.   I discussed the limitations, risks, security and privacy concerns of performing an evaluation and management service by telephone and the availability of in person appointments. I also discussed with the patient that there may be a patient responsible charge related to this service. The patient expressed understanding and agreed to proceed.   History of Present Illness: Andre [name] is an 83 year old right-handed white male with a history of Parkinson's disease with left-sided tremors.  The patient remains on Sinemet 25/250 mg tablet taking 2 tablets 3 times daily.  After 3 to 4 hours following dosing, the tremors on the left arm may return.  The patient has relatively good mobility, however.  He has had 1 stumble, no falls.  The patient has to push off with his arms to stand up from a seated position.  He walks without a cane.  He does have some dyspnea on exertion, he is followed through pulmonology for this, this does limit his ability to perform physical activity.  He denies any hallucinations.  He is sleeping fairly well at night.  He still has fatigue during the day, the use of Provigil has not helped.  Initially, he felt that he was having some hallucinations on the dose of Provigil 200 mg twice daily.  He does note some neck pain.  This is chronic in nature.   Observations/Objective: Attempt at video conferencing was unsuccessful, the patient could not activate the video.  The visit was done by telephone.  The patient appears to be alert and cooperative, his speech is well enunciated, not aphasic.  He is answering questions appropriately.  Assessment and Plan: 1.  Parkinson's disease  2.  Chronic fatigue  The patient will  continue his current dose of Sinemet, he is also on Comtan.  He will follow-up in 4 months, we will reevaluate his physical activity at that time.  The patient is urged to remain physically active, he is limited by his shortness of breath, however.  Follow Up Instructions:    I discussed the assessment and treatment plan with the patient. The patient was provided an opportunity to ask questions and all were answered. The patient agreed with the plan and demonstrated an understanding of the instructions.   The patient was advised to call back or seek an in-person evaluation if the symptoms worsen or if the condition fails to improve as anticipated.  I provided 15 minutes of non-face-to-face time during this encounter.   Kathrynn Ducking, MD

## 2018-08-30 ENCOUNTER — Telehealth: Payer: Self-pay | Admitting: Neurology

## 2018-08-30 NOTE — Telephone Encounter (Signed)
LVM to schedule a 4 month follow-up from virtual visit with Dr. Jannifer Franklin.

## 2018-08-31 ENCOUNTER — Inpatient Hospital Stay: Payer: PPO | Admitting: Internal Medicine

## 2018-08-31 ENCOUNTER — Inpatient Hospital Stay: Payer: PPO

## 2018-09-03 ENCOUNTER — Other Ambulatory Visit: Payer: Self-pay

## 2018-09-03 ENCOUNTER — Other Ambulatory Visit: Payer: Self-pay | Admitting: Internal Medicine

## 2018-09-03 ENCOUNTER — Other Ambulatory Visit: Payer: PPO

## 2018-09-03 ENCOUNTER — Telehealth: Payer: Self-pay | Admitting: Pulmonary Disease

## 2018-09-03 DIAGNOSIS — R310 Gross hematuria: Secondary | ICD-10-CM

## 2018-09-03 LAB — URINALYSIS, COMPLETE
Bilirubin, UA: NEGATIVE
Glucose, UA: NEGATIVE
Ketones, UA: NEGATIVE
Leukocytes,UA: NEGATIVE
Nitrite, UA: NEGATIVE
Protein,UA: NEGATIVE
RBC, UA: NEGATIVE
Specific Gravity, UA: 1.025 (ref 1.005–1.030)
Urobilinogen, Ur: 0.2 mg/dL (ref 0.2–1.0)
pH, UA: 5.5 (ref 5.0–7.5)

## 2018-09-03 LAB — MICROSCOPIC EXAMINATION
Bacteria, UA: NONE SEEN
RBC, Urine: NONE SEEN /hpf (ref 0–2)

## 2018-09-03 NOTE — Telephone Encounter (Signed)
Pt returning call. Please call her back

## 2018-09-03 NOTE — Telephone Encounter (Signed)
Attempted to call pt's wife [name] but unable to reach. Left message for [name] to return call.

## 2018-09-03 NOTE — Telephone Encounter (Signed)
Returned call to patient and wife.  Patient states he 'doesn't feel any different and maybe even a little worse' since his visit in Feb 2020.  Is using Anoro daily and using albuterol neb daily most days.  States he feels almost exactly the same and thinks he needs to be re-evaluated.  Very mild cough continues but seldom any phlegm and is clear phlegm.  SOB with activity continues the same as before starting Anoro.  Patient offered in-office visit or mychart virtual visit.  He felt it would be better to have face-to-face exam.  No fever.  No recent travel. Rarely goes out of the home and no known covid exposure or identified risks.  Appt 09/04/18 at 1:30 pm.  Arrive by 1:20 pm with meds and advised of curbside check in and temperature. Patient acknowledged understanding.  Nothing further needed.

## 2018-09-04 ENCOUNTER — Ambulatory Visit (INDEPENDENT_AMBULATORY_CARE_PROVIDER_SITE_OTHER): Payer: PPO

## 2018-09-04 ENCOUNTER — Encounter: Payer: Self-pay | Admitting: Primary Care

## 2018-09-04 ENCOUNTER — Ambulatory Visit (INDEPENDENT_AMBULATORY_CARE_PROVIDER_SITE_OTHER): Payer: PPO | Admitting: Primary Care

## 2018-09-04 VITALS — BP 136/78 | HR 78 | Temp 97.6°F | Ht 66.5 in | Wt 267.8 lb

## 2018-09-04 DIAGNOSIS — J8410 Pulmonary fibrosis, unspecified: Secondary | ICD-10-CM | POA: Diagnosis not present

## 2018-09-04 DIAGNOSIS — Z6841 Body Mass Index (BMI) 40.0 and over, adult: Secondary | ICD-10-CM

## 2018-09-04 DIAGNOSIS — G4733 Obstructive sleep apnea (adult) (pediatric): Secondary | ICD-10-CM

## 2018-09-04 DIAGNOSIS — J449 Chronic obstructive pulmonary disease, unspecified: Secondary | ICD-10-CM | POA: Diagnosis not present

## 2018-09-04 DIAGNOSIS — R0602 Shortness of breath: Secondary | ICD-10-CM

## 2018-09-04 DIAGNOSIS — J441 Chronic obstructive pulmonary disease with (acute) exacerbation: Secondary | ICD-10-CM | POA: Diagnosis not present

## 2018-09-04 DIAGNOSIS — J90 Pleural effusion, not elsewhere classified: Secondary | ICD-10-CM | POA: Diagnosis not present

## 2018-09-04 DIAGNOSIS — J45909 Unspecified asthma, uncomplicated: Secondary | ICD-10-CM | POA: Diagnosis not present

## 2018-09-04 LAB — CBC WITH DIFFERENTIAL/PLATELET
Basophils Absolute: 0.1 10*3/uL (ref 0.0–0.1)
Basophils Relative: 0.8 % (ref 0.0–3.0)
Eosinophils Absolute: 0.1 10*3/uL (ref 0.0–0.7)
Eosinophils Relative: 1 % (ref 0.0–5.0)
HCT: 44.1 % (ref 39.0–52.0)
Hemoglobin: 14.5 g/dL (ref 13.0–17.0)
Lymphocytes Relative: 21.1 % (ref 12.0–46.0)
Lymphs Abs: 2 10*3/uL (ref 0.7–4.0)
MCHC: 33 g/dL (ref 30.0–36.0)
MCV: 87 fl (ref 78.0–100.0)
Monocytes Absolute: 0.8 10*3/uL (ref 0.1–1.0)
Monocytes Relative: 8.6 % (ref 3.0–12.0)
Neutro Abs: 6.6 10*3/uL (ref 1.4–7.7)
Neutrophils Relative %: 68.5 % (ref 43.0–77.0)
Platelets: 115 10*3/uL — ABNORMAL LOW (ref 150.0–400.0)
RBC: 5.07 Mil/uL (ref 4.22–5.81)
RDW: 15.4 % (ref 11.5–15.5)
WBC: 9.7 10*3/uL (ref 4.0–10.5)

## 2018-09-04 LAB — BASIC METABOLIC PANEL
BUN: 15 mg/dL (ref 6–23)
CO2: 28 mEq/L (ref 19–32)
Calcium: 9 mg/dL (ref 8.4–10.5)
Chloride: 101 mEq/L (ref 96–112)
Creatinine, Ser: 0.87 mg/dL (ref 0.40–1.50)
GFR: 84.26 mL/min (ref >60.00)
Glucose, Bld: 108 mg/dL — ABNORMAL HIGH (ref 70–99)
Potassium: 3.9 mEq/L (ref 3.5–5.1)
Sodium: 138 mEq/L (ref 135–145)

## 2018-09-04 LAB — BRAIN NATRIURETIC PEPTIDE: Pro B Natriuretic peptide (BNP): 168 pg/mL — ABNORMAL HIGH (ref 0.0–100.0)

## 2018-09-04 MED ORDER — DOXYCYCLINE HYCLATE 100 MG PO TABS: 100.0000 mg | ORAL_TABLET | Freq: Two times a day (BID) | ORAL | 0 refills | Status: DC

## 2018-09-04 MED ORDER — TIOTROPIUM BROMIDE-OLODATEROL 2.5-2.5 MCG/ACT IN AERS: 2.0000 | INHALATION_SPRAY | Freq: Every day | RESPIRATORY_TRACT | 0 refills | Status: DC

## 2018-09-04 NOTE — Assessment & Plan Note (Addendum)
-   Shortness of breath is likely multifactorial d/t COPD, ILD, heart failure and weight - I do not suspect acute PE, patient is maintained on Eliquis and VSS  - Will check CXR and labs (CBC, bmet, bnp) today to r/o pneumonia, pleural effusion, anemia, CHF exac as possible causes of dyspnea

## 2018-09-04 NOTE — Assessment & Plan Note (Signed)
-   100% compliance with CPAP - Pressure 12 cm H20; AHI 2.3

## 2018-09-04 NOTE — Progress Notes (Signed)
Please instruct patient to take an additional lasix tab in the morning x 5 days. CXR showed possible superimposed infection- will also send in abx. Set up for video/televisit in 1-2 weeks.

## 2018-09-04 NOTE — Patient Instructions (Addendum)
I suspect your shortness of breath is multifactorial d/t COPD, heart failure, ILD and weight We may need in increase your diuretic once your labs are back if needed Follow up with cardiology   Recommendations: - Stop Anoro - Trial Stiolto - take 2 puffs once daily - Continue Albuterol rescue inhaler every 4-6 hours for breakthrough shortness of breath  Orders: - Chest xray today - Labs today (CBC, BMET and BNP)  Referral: - [facility] - Re-refer to pulmonary rehab

## 2018-09-04 NOTE — Assessment & Plan Note (Signed)
-   Refer to healthy weight and wellness 

## 2018-09-04 NOTE — Progress Notes (Signed)
@Patient  ID: Andre [name], male    DOB: 05-May-1935, 83 y.o.   MRN: [id number]  Chief Complaint  Patient presents with  . Follow-up    having SHOB increased, feeels it is worse, feels his Anoro is not helping    Referring provider: [name], MD  HPI: 83 year old male, former smoker quit in 1974. PMH significant for ILD, moderate COPD/emphysema, OSA on CPAP, HF, CAD, HTN, AF, GERD, Barrett's esophagus. Patient of Dr. Vaughan Browner, last seen on 05/23/18. CT Chest showed basilar fibrosis, pleural calcifications consistent with asbestos exposure. Fibrotic changes have remained stable since 2013. Anti-fibrotic treatment not recommended at this time. He was started on Anoro during last visit. Advised weight loss and pulmonary rehab.   09/04/2018 Patient presents today for acute visit with complaints shortness of breath x2 months. No improvement with Anoro or prn Albuterol.  He gets short of breath with minimal activity. Associated leg swelling. He weighs himself daily and has gained 1lb a month over the last 5 months. He has an apt with cardiology in 2 weeks. On blood thinner for previous hx PE after surgery. Denies fever, flu-like illness, cough or wheezing.   Testing reviewed: 06/12/2018 PFTs- FVC 2.38 [16%], FEV1 1.66 [66%], F/F 70, TLC 3.95 (61%), DLCO 16.34 [73%]  08/28/2017 Echocardiogram - LVEF 22-02%, grade 2 diastolic dysfunction, mild aortic stenosis. Severe dilatation of left atrium and right atrium, trivial pericardial effusion.  ResMed Airview 09/04/2018 - Usage 30/30 days, 100% >4 hours. Pressure 12 cm h20. AHI 2.3   Allergies  Allergen Reactions  . Pravastatin Other (See Comments)  . Prednisone Other (See Comments)    Pt states that med makes him hyper Pt states that med makes him hyper    Immunization History  Administered Date(s) Administered  . Influenza Split 03/18/2012  . Influenza, High Dose Seasonal PF 01/13/2016, 01/26/2017, 12/29/2017  . Influenza-Unspecified  01/06/2016, 01/26/2017  . Pneumococcal Conjugate-13 05/10/2016  . Td 02/08/2017  . Zoster 04/19/2011  . Zoster Recombinat (Shingrix) 12/07/2017    Past Medical History:  Diagnosis Date  . Atherosclerosis of abdominal aorta ([place])   . CAD (coronary artery disease)   . Cervical spondylosis 10/01/2013  . Chronic diastolic CHF (congestive heart failure) ([place])    a. 07/2016 Echo: >55%; b. 10/2016 Echo: EF 55-60%, Gr1 DD, Ao sclerosis w/o stenosis, sev dil LA; c. 08/2017 Echo: EF 60-65%, no rwma, Gr2 DD, mild AS, sev dil LA/RA.  [name] Coronary artery disease    a. 1998 s/p mini-cabg @ [facility] - LIMA->LAD;  b. 07/2016 St Echo: Inadequate HR w/ HTN response;  c.  08/2016 MV: EF 67%, no ischemia; d. 10/2016 NSTEMI/Cath: RCA 95p (4.0x26 Onyx DES), LIMA->LAD nl; e. 09/2017 Cath: LM 40/30, LAD 100ost, RI 80, LCX nl, OM2/3 nl, RCA patent stent, 40m, LIMA->LAD nl-->Med Rx.  . DDD (degenerative disc disease), cervical   . DDD (degenerative disc disease), lumbar   . Depression   . GERD (gastroesophageal reflux disease)   . Hyperlipidemia   . Hypertension   . Hypothyroidism   . PAF (paroxysmal atrial fibrillation) ([facility])    a. s/p DCCV-->maintaining sinus on amiodarone;  b. CHA2DS2VASc = 5-->eliquis.  . Parkinson's disease ([place])    tremors  . Pleural effusion, right    a. 09/2017 s/p thoracentesis.  [name] PNA (pneumonia) 08/26/2017  . Pulmonary embolism ([place]) 2011  . Pulmonary fibrosis ([place])   . 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 Sleep apnea    wears CPAP  . Thrombocytopenia (  [place])     Tobacco History: Social History   Tobacco Use  Smoking Status Former Smoker  . Packs/day: 1.00  . Years: 10.00  . Pack years: 10.00  . Types: Cigarettes, Pipe, Cigars  . Last attempt to quit: 04/18/1972  . Years since quitting: 46.4  Smokeless Tobacco Former Systems developer  . Types: Chew  . Quit date: 04/18/1972   Counseling given: Not Answered   Outpatient Medications Prior to Visit  Medication Sig Dispense Refill  . albuterol  (PROVENTIL) (2.5 MG/3ML) 0.083% nebulizer solution TAKE 3MLS BY NEBULIZATION EVERY 4 HOURS AS NEEDED FOR WHEEZING OR SHORTNESS OF BREATH 75 mL 1  . carbidopa-levodopa (SINEMET IR) 25-250 MG tablet TAKE TWO TABLETS 3 TIMES DAILY (Patient taking differently: 2 tablets 3 (three) times daily. ) 540 tablet 3  . cyclobenzaprine (FLEXERIL) 5 MG tablet Take 5 mg by mouth daily as needed for muscle spasms.     [name] ELIQUIS 5 MG TABS tablet TAKE ONE TABLET BY MOUTH TWICE DAILY 60 tablet 5  . entacapone (COMTAN) 200 MG tablet Take 1 tablet (200 mg total) by mouth 3 (three) times daily. 270 tablet 3  . escitalopram (LEXAPRO) 20 MG tablet Take 1 tablet (20 mg total) by mouth daily. 90 tablet 1  . esomeprazole (NEXIUM) 20 MG capsule Take 48 mg by mouth daily at 12 noon.     . fluticasone (FLONASE) 50 MCG/ACT nasal spray TAKE 2 PUFFS IN EACH NOSTRIL DAILY 16 g 3  . furosemide (LASIX) 20 MG tablet TAKE TWO TABLETS BY MOUTH TWICE DAILY 120 tablet 2  . HYDROcodone-acetaminophen (NORCO/VICODIN) 5-325 MG tablet Take by mouth.    . isosorbide mononitrate (IMDUR) 30 MG 24 hr tablet TAKE 1 TABLET BY MOUTH DAILY 30 tablet 5  . ketoconazole (NIZORAL) 2 % cream     . levothyroxine (SYNTHROID, LEVOTHROID) 50 MCG tablet TAKE 1 TABLET EVERY DAY ON EMPTY STOMACHWITH A GLASS OF WATER AT LEAST 30-60 MINBEFORE BREAKFAST 90 tablet 1  . losartan (COZAAR) 25 MG tablet TAKE ONE TABLET BY MOUTH EVERY DAY 90 tablet 1  . mirabegron ER (MYRBETRIQ) 50 MG TB24 tablet Take 1 tablet (50 mg total) by mouth daily. 30 tablet 11  . modafinil (PROVIGIL) 200 MG tablet TAKE 1 TABLET BY MOUTH TWICE DAILY 180 tablet 1  . mometasone (ELOCON) 0.1 % cream     . Polyethyl Glycol-Propyl Glycol (SYSTANE OP) Place 1 drop into both eyes 2 (two) times daily as needed (dry eyes).    . potassium chloride SA (K-DUR,KLOR-CON) 20 MEQ tablet Take 1 tablet (20 mEq total) by mouth daily. 90 tablet 3  . rosuvastatin (CRESTOR) 10 MG tablet TAKE ONE TABLET EVERY DAY 90  tablet 1  . umeclidinium-vilanterol (ANORO ELLIPTA) 62.5-25 MCG/INH AEPB Inhale 1 puff into the lungs daily.    . carvedilol (COREG) 3.125 MG tablet Take 2 tablets (6.25 mg total) by mouth 2 (two) times daily with a meal. (Patient taking differently: Take 3.125 mg by mouth 2 (two) times daily with a meal. 2 tablets BID) 120 tablet 3   No facility-administered medications prior to visit.     Review of Systems  Review of Systems  Constitutional: Negative.   HENT: Negative.   Respiratory: Positive for shortness of breath. Negative for cough and wheezing.   Cardiovascular: Positive for leg swelling. Negative for chest pain.   Physical Exam  BP 136/78 (BP Location: Left Arm, Cuff Size: Normal)   Pulse 78   Temp 97.6 F (36.4 C) (Oral)  Ht 5' 6.5" (1.689 m)   Wt 267 lb 12.8 oz (121.5 kg)   SpO2 96%   BMI 42.58 kg/m  Physical Exam Constitutional:      General: He is not in acute distress.    Appearance: Normal appearance. He is obese.  HENT:     Mouth/Throat:     Mouth: Mucous membranes are moist.     Pharynx: Oropharynx is clear.  Cardiovascular:     Rate and Rhythm: Normal rate and regular rhythm.     Comments: +1 BLE edema Pulmonary:     Breath sounds: No wheezing.     Comments: Diminished +DOE Skin:    General: Skin is warm and dry.  Neurological:     General: No focal deficit present.     Mental Status: He is alert and oriented to person, place, and time. Mental status is at baseline.  Psychiatric:        Mood and Affect: Mood normal.        Behavior: Behavior normal.        Thought Content: Thought content normal.        Judgment: Judgment normal.      Lab Results:  CBC    Component Value Date/Time   WBC 7.7 06/07/2018 0849   RBC 4.95 06/07/2018 0849   HGB 14.1 06/07/2018 0849   HGB 14.9 10/06/2015 0900   HCT 43.8 06/07/2018 0849   HCT 46.4 10/06/2015 0900   PLT 126 (L) 06/07/2018 0849   PLT 142 (L) 10/06/2015 0900   MCV 88.5 06/07/2018 0849   MCV 87  10/06/2015 0900   MCV 86 08/12/2014 0816   MCH 28.5 06/07/2018 0849   MCHC 32.2 06/07/2018 0849   RDW 14.4 06/07/2018 0849   RDW 16.9 (H) 10/06/2015 0900   RDW 14.8 (H) 08/12/2014 0816   LYMPHSABS 1.3 06/07/2018 0849   LYMPHSABS 2.1 10/06/2015 0900   LYMPHSABS 1.6 08/12/2014 0816   MONOABS 0.7 06/07/2018 0849   MONOABS 0.8 08/12/2014 0816   EOSABS 0.1 06/07/2018 0849   EOSABS 0.1 10/06/2015 0900   EOSABS 0.1 08/12/2014 0816   BASOSABS 0.1 06/07/2018 0849   BASOSABS 0.0 10/06/2015 0900   BASOSABS 0.1 08/12/2014 0816    BMET    Component Value Date/Time   NA 137 07/09/2018 1133   NA 139 10/09/2017 1441   K 3.8 07/09/2018 1133   CL 101 07/09/2018 1133   CO2 28 07/09/2018 1133   GLUCOSE 142 (H) 07/09/2018 1133   BUN 20 07/09/2018 1133   BUN 23 10/09/2017 1441   CREATININE 0.87 07/09/2018 1133   CREATININE 0.95 12/06/2011 1554   CALCIUM 9.1 07/09/2018 1133   GFRNONAA >60 03/02/2018 1335   GFRNONAA >60 12/06/2011 1554   GFRAA >60 03/02/2018 1335   GFRAA >60 12/06/2011 1554    BNP    Component Value Date/Time   BNP 293.0 (H) 08/26/2017 0721    ProBNP No results found for: PROBNP  Imaging: Dg Chest 2 View  Result Date: 09/04/2018 CLINICAL DATA:  Dyspnea EXAM: CHEST - 2 VIEW COMPARISON:  Chest x-ray dated 10/10/2017. CT chest dated 06/08/2018. FINDINGS: The cardiac silhouette is enlarged. Chronic fibrotic changes are noted involving both lung fields. There is slightly increased density in the bilateral mid lung zones there is no pneumothorax or large pleural effusion. There is blunting of both costophrenic angles. There is no acute osseous abnormality. Mild degenerative changes are noted throughout the thoracic spine. Aortic calcifications are noted. Pleural based plaques are  again noted. IMPRESSION: 1. Stable cardiomegaly. 2. Again noted are findings of pulmonary fibrosis with slight interval increase in density in the mid lung zones bilaterally. A superimposed infection  is not excluded. 3. Stable right-sided pleural effusion Electronically Signed   By: Constance Holster M.D.   On: 09/04/2018 14:57     Assessment & Plan:   Dyspnea - Shortness of breath is likely multifactorial d/t COPD, ILD, heart failure and weight - I do not suspect acute PE, patient is maintained on Eliquis and VSS  - Will check CXR and labs (CBC, bmet, bnp) today to r/o pneumonia, pleural effusion, anemia, CHF exac as possible causes of dyspnea   COPD (chronic obstructive pulmonary disease) ([place]) - Exam not consistent with COPD exacerbation  - In-check dial set on Ellipta, 30-40 on peak flow - Change Anoro to Stiolto 2 puff daily (ease of administering inhaled medication) - Refer pulmonary rehab  OSA (obstructive sleep apnea) - 100% compliance with CPAP - Pressure 12 cm H20; AHI 2.3  BMI 40.0-44.9, adult ([place]) - Refer to healthy weight and wellness   Martyn Ehrich, NP 09/04/2018

## 2018-09-04 NOTE — Assessment & Plan Note (Addendum)
-   Exam not consistent with COPD exacerbation  - In-check dial set on Ellipta, 30-40 on peak flow - Change Anoro to Stiolto 2 puff daily (ease of administering inhaled medication) - Refer pulmonary rehab

## 2018-09-04 NOTE — Addendum Note (Signed)
Addended by: Martyn Ehrich on: 09/04/2018 04:07 PM   Modules accepted: Orders

## 2018-09-05 ENCOUNTER — Telehealth: Payer: Self-pay | Admitting: Cardiovascular Disease

## 2018-09-05 ENCOUNTER — Other Ambulatory Visit: Payer: Self-pay | Admitting: Cardiovascular Disease

## 2018-09-05 NOTE — Telephone Encounter (Signed)
Pt c/o medication issue:  1. Name of Medication: furosemide (LASIX)   2. How are you currently taking this medication (dosage and times per day)? 20 MG 2 tablets twice daily   3. Are you having a reaction (difficulty breathing--STAT)? Does not feel it is working  4. What is your medication issue? Patient wife calling, would like to know if patient can try a different fluid pill, possible Bumex - patient has been 80 MG Lasix for some time.  Please call to discuss.

## 2018-09-05 NOTE — Telephone Encounter (Signed)
The patient has been having increased shortness of breath lately but without a noticeable weight gain (one pound in a few months). The wife stated that she was talking to a nurse recently and the nurse stated that Bumex might work better for him. The wife was calling to see what Dr. Tyrell Antonio thoughts were on this. The patient stated that he was fine with the Furosemide but would do what Dr. Fletcher Anon thought was best.  The patient was seen by pulmonology yesterday had labs drawn. Instructions below:  Notes recorded by Martyn Ehrich, NP on 09/04/2018 at 4:07 PM EDT Please instruct patient to take an additional lasix tab in the morning x 5 days. CXR showed possible superimposed infection- will also send in abx. Set up for video/televisit in 1-2 weeks.   The patient and the wife have been advised to do what the pulmonologist has suggested with the antibiotics and increase in lasix for 5 days. The Bumex or alternative, if change is needed, can be discussed at the patient's follow up. The patient has a follow up on 09/21/2018 with Dr. Fletcher Anon. If he needs anything sooner, he has been advised to call back.

## 2018-09-06 NOTE — Telephone Encounter (Signed)
Okay; thanks.

## 2018-09-08 ENCOUNTER — Other Ambulatory Visit: Payer: Self-pay | Admitting: Cardiovascular Disease

## 2018-09-13 ENCOUNTER — Telehealth: Payer: Self-pay | Admitting: Primary Care

## 2018-09-13 MED ORDER — TIOTROPIUM BROMIDE-OLODATEROL 2.5-2.5 MCG/ACT IN AERS: 2.0000 | INHALATION_SPRAY | Freq: Every day | RESPIRATORY_TRACT | 5 refills | Status: DC

## 2018-09-13 NOTE — Telephone Encounter (Signed)
Left detailed message for [facility] stating that I have called in the RX for the patient.   Nothing further needed at time of call.

## 2018-09-14 ENCOUNTER — Telehealth: Payer: Self-pay

## 2018-09-14 NOTE — Telephone Encounter (Signed)
Virtual Visit Pre-Appointment Phone Call  "Andre [name], I am calling you today to discuss your upcoming appointment. We are currently trying to limit exposure to the virus that causes COVID-19 by seeing patients at home rather than in the office."  1. "What is the BEST phone number to call the day of the visit?" - include this in appointment notes  2. Do you have or have access to (through a family member/friend) a smartphone with video capability that we can use for your visit?" a. If yes - list this number in appt notes as cell (if different from BEST phone #) and list the appointment type as a VIDEO visit in appointment notes b. If no - list the appointment type as a PHONE visit in appointment notes  3. Confirm consent - "In the setting of the current Covid19 crisis, you are scheduled for a phone visit with your provider on September 21, 2018 at 4:20PM.  Just as we do with many in-office visits, in order for you to participate in this visit, we must obtain consent.  If you'd like, I can send this to your mychart (if signed up) or email for you to review.  Otherwise, I can obtain your verbal consent now.  All virtual visits are billed to your insurance company just like a normal visit would be.  By agreeing to a virtual visit, we'd like you to understand that the technology does not allow for your provider to perform an examination, and thus may limit your provider's ability to fully assess your condition. If your provider identifies any concerns that need to be evaluated in person, we will make arrangements to do so.  Finally, though the technology is pretty good, we cannot assure that it will always work on either your or our end, and in the setting of a video visit, we may have to convert it to a phone-only visit.  In either situation, we cannot ensure that we have a secure connection.  Are you willing to proceed?" STAFF: Did the patient verbally acknowledge consent to telehealth visit? Document YES/NO  here: YES  4. Advise patient to be prepared - "Two hours prior to your appointment, go ahead and check your blood pressure, pulse, oxygen saturation, and your weight (if you have the equipment to check those) and write them all down. When your visit starts, your provider will ask you for this information. If you have an Apple Watch or Kardia device, please plan to have heart rate information ready on the day of your appointment. Please have a pen and paper handy nearby the day of the visit as well."  5. Give patient instructions for MyChart download to smartphone OR [email or web address] as below if video visit (depending on what platform provider is using)  6. Inform patient they will receive a phone call 15 minutes prior to their appointment time (may be from unknown caller ID) so they should be prepared to answer    TELEPHONE CALL NOTE  Andre [name] has been deemed a candidate for a follow-up tele-health visit to limit community exposure during the Covid-19 pandemic. I spoke with the patient via phone to ensure availability of phone/video source, confirm preferred email & phone number, and discuss instructions and expectations.  I reminded Andre [name] to be prepared with any vital sign and/or heart rhythm information that could potentially be obtained via home monitoring, at the time of his visit. I reminded Andre [name] to expect a phone call prior to  his visit.  Rene Paci McClain 09/14/2018 1:32 PM    FULL LENGTH CONSENT FOR TELE-HEALTH VISIT   I hereby voluntarily request, consent and authorize [facility] and its employed or contracted physicians, physician assistants, nurse practitioners or other licensed health care professionals (the Practitioner), to provide me with telemedicine health care services (the Services") as deemed necessary by the treating Practitioner. I acknowledge and consent to receive the Services by the Practitioner via telemedicine. I understand that the  telemedicine visit will involve communicating with the Practitioner through live audiovisual communication technology and the disclosure of certain medical information by electronic transmission. I acknowledge that I have been given the opportunity to request an in-person assessment or other available alternative prior to the telemedicine visit and am voluntarily participating in the telemedicine visit.  I understand that I have the right to withhold or withdraw my consent to the use of telemedicine in the course of my care at any time, without affecting my right to future care or treatment, and that the Practitioner or I may terminate the telemedicine visit at any time. I understand that I have the right to inspect all information obtained and/or recorded in the course of the telemedicine visit and may receive copies of available information for a reasonable fee.  I understand that some of the potential risks of receiving the Services via telemedicine include:   Delay or interruption in medical evaluation due to technological equipment failure or disruption;  Information transmitted may not be sufficient (e.g. poor resolution of images) to allow for appropriate medical decision making by the Practitioner; and/or   In rare instances, security protocols could fail, causing a breach of personal health information.  Furthermore, I acknowledge that it is my responsibility to provide information about my medical history, conditions and care that is complete and accurate to the best of my ability. I acknowledge that Practitioner's advice, recommendations, and/or decision may be based on factors not within their control, such as incomplete or inaccurate data provided by me or distortions of diagnostic images or specimens that may result from electronic transmissions. I understand that the practice of medicine is not an exact science and that Practitioner makes no warranties or guarantees regarding treatment  outcomes. I acknowledge that I will receive a copy of this consent concurrently upon execution via email to the email address I last provided but may also request a printed copy by calling the office of [facility].    I understand that my insurance will be billed for this visit.   I have read or had this consent read to me.  I understand the contents of this consent, which adequately explains the benefits and risks of the Services being provided via telemedicine.   I have been provided ample opportunity to ask questions regarding this consent and the Services and have had my questions answered to my satisfaction.  I give my informed consent for the services to be provided through the use of telemedicine in my medical care  By participating in this telemedicine visit I agree to the above.

## 2018-09-18 ENCOUNTER — Ambulatory Visit (INDEPENDENT_AMBULATORY_CARE_PROVIDER_SITE_OTHER): Payer: PPO | Admitting: Primary Care

## 2018-09-18 ENCOUNTER — Encounter: Payer: Self-pay | Admitting: Primary Care

## 2018-09-18 ENCOUNTER — Ambulatory Visit: Payer: PPO | Admitting: Primary Care

## 2018-09-18 ENCOUNTER — Other Ambulatory Visit: Payer: Self-pay

## 2018-09-18 DIAGNOSIS — J449 Chronic obstructive pulmonary disease, unspecified: Secondary | ICD-10-CM

## 2018-09-18 NOTE — Patient Instructions (Addendum)
Glad you are feeling better!  Continue Stiolto - take 2 puffs in the morning (will send in prescription)  Albuterol nebulizer ever 6 hours as needed for breakthrough shortness of breath/wheezing  Follow up with Dr. Vaughan Browner in 3 months or if symptoms return/worsen

## 2018-09-18 NOTE — Progress Notes (Signed)
Virtual Visit via Telephone Note  I connected with Andre [name] on 09/18/18 at 11:30 AM EDT by telephone and verified that I am speaking with the correct person using two identifiers.  Location: Patient: Home Provider: Office   I discussed the limitations, risks, security and privacy concerns of performing an evaluation and management service by telephone and the availability of in person appointments. I also discussed with the patient that there may be a patient responsible charge related to this service. The patient expressed understanding and agreed to proceed.   History of Present Illness: 83 year old male, former smoker quit in 1974. PMH significant for ILD, moderate COPD/emphysema, OSA on CPAP, HF, CAD, HTN, AF, GERD, Barrett's esophagus. Patient of Dr. Vaughan [name], last seen on 05/23/18. CT Chest showed basilar fibrosis, pleural calcifications consistent with asbestos exposure. Fibrotic changes have remained stable since 2013. Anti-fibrotic treatment not recommended at this time. He was started on Anoro during last visit. Advised weight loss and pulmonary rehab.   09/04/2018 Patient presents today for acute visit with complaints shortness of breath x2 months. No improvement with Anoro or prn Albuterol.  He gets short of breath with minimal activity. Associated leg swelling. He weighs himself daily and has gained 1lb a month over the last 5 months. He has an apt with cardiology in 2 weeks. On blood thinner for previous hx PE after surgery. Denies fever, flu-like illness, cough or wheezing. CXR on 5/19 showed pulmonary fibrosis with possible superimposed infection mid-lung bilaterally. BNP was elevated at 168. Instructed to take additional lasix tablet x 5 days and sent in prescription for doxycycline. Anoro changed to [employer]. Patient re-referred to pulmonary rehab.   09/18/2018 Patient called today for 2 week follow-up. He is feeling much better. He started [employer] and states that almost  immediately he felt better. No cough. Weight varies 2-3 lbs a day and wife notices leg swelling. Taking lasix 40mg  twice daily. They plan on discussing diuretics with cardiology.    Observations/Objective:  - No shortness of breath, wheezing or cough observed during phone conversation  ___________________________________________________  CXR on 5/19 -stable cardiomegaly, pulmonary fibrosis with slight interval increase in density in the mid-lung zones bilaterally, superimposed infection is not excluded. Stable right sided pleurasl effusion.   Testing reviewed: 06/12/2018 PFTs- FVC 2.38 [16%], FEV1 1.66 [66%], F/F 70, TLC 3.95 (61%), DLCO 16.34 [73%]  08/28/2017 Echocardiogram - LVEF 22-02%, grade 2 diastolic dysfunction, mild aortic stenosis. Severe dilatation of left atrium and right atrium, trivial pericardial effusion.  ResMed Airview 09/04/2018 - Usage 30/30 days, 100% >4 hours. Pressure 12 cm h20. AHI 2.3   Assessment and Plan:  COPD exacerbation/dyspnea - Improved after completing doxycycline course and starting Stiolto  COPD - Continue Stiolto - take 2 puffs in the morning (will send in prescription) - Albuterol nebulizer ever 6 hours as needed for breakthrough shortness of breath/wheezing   Follow Up Instructions:  Follow up with Dr. Vaughan [name] in 3 months or if symptoms return/worsen   I discussed the assessment and treatment plan with the patient. The patient was provided an opportunity to ask questions and all were answered. The patient agreed with the plan and demonstrated an understanding of the instructions.   The patient was advised to call back or seek an in-person evaluation if the symptoms worsen or if the condition fails to improve as anticipated.  I provided 18 minutes of non-face-to-face time during this encounter.   Martyn Ehrich, NP

## 2018-09-19 ENCOUNTER — Inpatient Hospital Stay: Payer: PPO | Admitting: Internal Medicine

## 2018-09-19 ENCOUNTER — Inpatient Hospital Stay: Payer: PPO

## 2018-09-21 ENCOUNTER — Other Ambulatory Visit: Payer: Self-pay

## 2018-09-21 ENCOUNTER — Encounter: Payer: Self-pay | Admitting: Cardiovascular Disease

## 2018-09-21 ENCOUNTER — Telehealth (INDEPENDENT_AMBULATORY_CARE_PROVIDER_SITE_OTHER): Payer: PPO | Admitting: Cardiovascular Disease

## 2018-09-21 ENCOUNTER — Telehealth: Payer: Self-pay

## 2018-09-21 VITALS — BP 145/77 | HR 68 | Temp 97.4°F | Ht 67.0 in | Wt 266.0 lb

## 2018-09-21 DIAGNOSIS — I5032 Chronic diastolic (congestive) heart failure: Secondary | ICD-10-CM

## 2018-09-21 DIAGNOSIS — M5030 Other cervical disc degeneration, unspecified cervical region: Secondary | ICD-10-CM | POA: Diagnosis not present

## 2018-09-21 DIAGNOSIS — M47812 Spondylosis without myelopathy or radiculopathy, cervical region: Secondary | ICD-10-CM | POA: Diagnosis not present

## 2018-09-21 DIAGNOSIS — M4802 Spinal stenosis, cervical region: Secondary | ICD-10-CM | POA: Diagnosis not present

## 2018-09-21 DIAGNOSIS — I2510 Atherosclerotic heart disease of native coronary artery without angina pectoris: Secondary | ICD-10-CM | POA: Diagnosis not present

## 2018-09-21 DIAGNOSIS — M62838 Other muscle spasm: Secondary | ICD-10-CM | POA: Diagnosis not present

## 2018-09-21 DIAGNOSIS — M5412 Radiculopathy, cervical region: Secondary | ICD-10-CM | POA: Diagnosis not present

## 2018-09-21 NOTE — Patient Instructions (Signed)
Medication Instructions:  Continue same medications If you need a refill on your cardiac medications before your next appointment, please call your pharmacy.   Lab work: None If you have labs (blood work) drawn today and your tests are completely normal, you will receive your results only by: . MyChart Message (if you have MyChart) OR . A paper copy in the mail If you have any lab test that is abnormal or we need to change your treatment, we will call you to review the results.  Testing/Procedures: None  Follow-Up: At [facility], you and your health needs are our priority.  As part of our continuing mission to provide you with exceptional heart care, we have created designated Provider Care Teams.  These Care Teams include your primary Cardiologist (physician) and Advanced Practice Providers (APPs -  Physician Assistants and Nurse Practitioners) who all work together to provide you with the care you need, when you need it. You will need a follow up appointment in 4 months.  Please call our office 2 months in advance to schedule this appointment.  You may see Averyanna Sax, MD or one of the following Advanced Practice Providers on your designated Care Team:   Christopher Berge, NP Ryan Dunn, PA-C . Jacquelyn Visser, PA-C  

## 2018-09-21 NOTE — Telephone Encounter (Signed)

## 2018-09-21 NOTE — Progress Notes (Signed)
Virtual Visit via Telephone Note   This visit type was conducted due to national recommendations for restrictions regarding the COVID-19 Pandemic (e.g. social distancing) in an effort to limit this patient's exposure and mitigate transmission in our community.  Due to his co-morbid illnesses, this patient is at least at moderate risk for complications without adequate follow up.  This format is felt to be most appropriate for this patient at this time.  The patient did not have access to video technology/had technical difficulties with video requiring transitioning to audio format only (telephone).  All issues noted in this document were discussed and addressed.  No physical exam could be performed with this format.  Please refer to the patient's chart for his  consent to telehealth for [facility].   Date:  09/21/2018   ID:  Andre [name], DOB [birth year], MRN [id number]  Patient Location: Home Provider Location: Office  PCP:  [name], MD  Cardiologist:  [name], MD  Electrophysiologist:  None   Evaluation Performed:  Follow-Up Visit  Chief Complaint: Chronic shortness of breath  History of Present Illness:    Andre [name] is a 83 y.o. male was reached via phone today for follow-up regarding coronary artery disease.   He has known history of coronary artery disease status post 1 vessel CABG with LIMA to LAD in 1998, hypertension, hyperlipidemia, chronic diastolic heart aure, paroxysmal atrial fibrillation and morbid obesity. He also has history of pulmonary embolism and Parkinson's. He was hospitalized in July of 2018 with a small non-ST elevation myocardial infarction. Echocardiogram showed normal LV systolic function with severely dilated left atrium. Cardiac catheterization showed occluded native LAD with patent LIMA, severe proximal RCA stenosis with heavy thrombus and moderate distal left main stenosis with significant ostial stenosis in the ramus branch. EF was  50-55% with mildly elevated left ventricular end-diastolic pressure. I performed successful angioplasty and drug-eluting stent placement to the right coronary artery which was complicated by embolization into a large RV branch at the lesion site. This was treated with balloon angioplasty which established TIMI 1 flow. He continued to have significant exertional dyspnea and thus I proceeded with a right and left cardiac catheterization in June of 2019 which showed significant underlying three-vessel coronary artery disease with patent LIMA to LAD and patent RCA stent.  There was significant ostial disease in ramus branch as well as first diagonal.  Both of these were unchanged from most recent cardiac catheterization.  Right heart catheterization showed only mildly elevated filling pressures, minimal pulmonary hypertension and normal cardiac output.  Based on that, I felt that his symptoms were mostly noncardiac. Shortness of breath was felt to be multifactorial due to chronic diastolic heart failure, lung disease, physical deconditioning and possible allergies.    Pulmonary function testing was suggestive of restrictive physiology and CT scan showed early pulmonary fibrosis and COPD.  Due to these changes, we elected to discontinue amiodarone.    He reports improvement in shortness of breath after changing his inhalers.  He has not had any recent chest pain and overall he feels much better than before.   The patient does not have symptoms concerning for COVID-19 infection (fever, chills, cough, or new shortness of breath).    Past Medical History:  Diagnosis Date   Atherosclerosis of abdominal aorta (HCC)    CAD (coronary artery disease)    Cervical spondylosis 10/01/2013   Chronic diastolic CHF (congestive heart failure) ([place])    a. 07/2016 Echo: >55%;  b. 10/2016 Echo: EF 55-60%, Gr1 DD, Ao sclerosis w/o stenosis, sev dil LA; c. 08/2017 Echo: EF 60-65%, no rwma, Gr2 DD, mild AS, sev dil LA/RA.     Coronary artery disease    a. 1998 s/p mini-cabg @ [facility] - LIMA->LAD;  b. 07/2016 St Echo: Inadequate HR w/ HTN response;  c.  08/2016 MV: EF 67%, no ischemia; d. 10/2016 NSTEMI/Cath: RCA 95p (4.0x26 Onyx DES), LIMA->LAD nl; e. 09/2017 Cath: LM 40/30, LAD 100ost, RI 80, LCX nl, OM2/3 nl, RCA patent stent, 49m, LIMA->LAD nl-->Med Rx.   DDD (degenerative disc disease), cervical    DDD (degenerative disc disease), lumbar    Depression    GERD (gastroesophageal reflux disease)    Hyperlipidemia    Hypertension    Hypothyroidism    PAF (paroxysmal atrial fibrillation) ([place])    a. s/p DCCV-->maintaining sinus on amiodarone;  b. CHA2DS2VASc = 5-->eliquis.   Parkinson's disease ([place])    tremors   Pleural effusion, right    a. 09/2017 s/p thoracentesis.   PNA (pneumonia) 08/26/2017   Pulmonary embolism ([place]) 2011   Pulmonary fibrosis ([place])    Secondary erythrocytosis 01/28/2015   Sleep apnea    wears CPAP   Thrombocytopenia [facility])    Past Surgical History:  Procedure Laterality Date   BACK SURGERY  1960   CARDIAC CATHETERIZATION     CHOLECYSTECTOMY  2010   COLONOSCOPY WITH PROPOFOL N/A 06/07/2018   Procedure: COLONOSCOPY WITH PROPOFOL;  Surgeon: Lollie Sails, MD;  Location: [facility] ENDOSCOPY;  Service: Endoscopy;  Laterality: N/A;   CORONARY ARTERY BYPASS GRAFT  01/07/1997   CORONARY STENT INTERVENTION N/A 10/31/2016   Procedure: Coronary Stent Intervention;  Surgeon: Wellington Hampshire, MD;  Location: [place] [facility] LAB;  Service: Cardiovascular;  Laterality: N/A;   ELECTROPHYSIOLOGIC STUDY N/A 07/14/2015   Procedure: CARDIOVERSION;  Surgeon: Yolonda Kida, MD;  Location: [facility] ORS;  Service: Cardiovascular;  Laterality: N/A;   ELECTROPHYSIOLOGIC STUDY N/A 10/12/2015   Procedure: CARDIOVERSION;  Surgeon: Minna Merritts, MD;  Location: [facility] ORS;  Service: Cardiovascular;  Laterality: N/A;   LEFT HEART CATH AND CORONARY ANGIOGRAPHY N/A 10/31/2016   Procedure: Left  Heart Cath and Coronary Angiography;  Surgeon: Wellington Hampshire, MD;  Location: [place] CV LAB;  Service: Cardiovascular;  Laterality: N/A;   OTHER SURGICAL HISTORY  1998   Bypass   RIGHT/LEFT HEART CATH AND CORONARY ANGIOGRAPHY N/A 09/18/2017   Procedure: RIGHT/LEFT HEART CATH AND CORONARY ANGIOGRAPHY;  Surgeon: Wellington Hampshire, MD;  Location: [place] CV LAB;  Service: Cardiovascular;  Laterality: N/A;     Current Meds  Medication Sig   albuterol (PROVENTIL) (2.5 MG/3ML) 0.083% nebulizer solution TAKE 3MLS BY NEBULIZATION EVERY 4 HOURS AS NEEDED FOR WHEEZING OR SHORTNESS OF BREATH   carbidopa-levodopa (SINEMET IR) 25-250 MG tablet TAKE TWO TABLETS 3 TIMES DAILY (Patient taking differently: 2 tablets 3 (three) times daily. )   carvedilol (COREG) 3.125 MG tablet TAKE TWO TABLETS TWICE DAILY WITH A MEAL   cyclobenzaprine (FLEXERIL) 5 MG tablet Take 5 mg by mouth daily as needed for muscle spasms.    ELIQUIS 5 MG TABS tablet TAKE ONE TABLET BY MOUTH TWICE DAILY   entacapone (COMTAN) 200 MG tablet Take 1 tablet (200 mg total) by mouth 3 (three) times daily.   escitalopram (LEXAPRO) 20 MG tablet Take 1 tablet (20 mg total) by mouth daily. (Patient taking differently: Take 10 mg by mouth daily. )   esomeprazole (NEXIUM) 20 MG capsule  Take 48 mg by mouth daily at 12 noon.    fluticasone (FLONASE) 50 MCG/ACT nasal spray TAKE 2 PUFFS IN EACH NOSTRIL DAILY   furosemide (LASIX) 20 MG tablet TAKE TWO TABLETS TWICE A DAY   HYDROcodone-acetaminophen (NORCO/VICODIN) 5-325 MG tablet Take by mouth.   isosorbide mononitrate (IMDUR) 30 MG 24 hr tablet TAKE 1 TABLET BY MOUTH DAILY   ketoconazole (NIZORAL) 2 % cream    levothyroxine (SYNTHROID, LEVOTHROID) 50 MCG tablet TAKE 1 TABLET EVERY DAY ON EMPTY STOMACHWITH A GLASS OF WATER AT LEAST 30-60 MINBEFORE BREAKFAST   losartan (COZAAR) 25 MG tablet TAKE 1 TABLET BY MOUTH DAILY   mirabegron ER (MYRBETRIQ) 50 MG TB24 tablet Take 1  tablet (50 mg total) by mouth daily.   modafinil (PROVIGIL) 200 MG tablet TAKE 1 TABLET BY MOUTH TWICE DAILY   mometasone (ELOCON) 0.1 % cream    Polyethyl Glycol-Propyl Glycol (SYSTANE OP) Place 1 drop into both eyes 2 (two) times daily as needed (dry eyes).   potassium chloride SA (K-DUR,KLOR-CON) 20 MEQ tablet Take 1 tablet (20 mEq total) by mouth daily.   rosuvastatin (CRESTOR) 10 MG tablet TAKE ONE TABLET EVERY DAY   Tiotropium Bromide-Olodaterol (STIOLTO RESPIMAT) 2.5-2.5 MCG/ACT AERS Inhale 2 puffs into the lungs daily.     Allergies:   Pravastatin and Prednisone   Social History   Tobacco Use   Smoking status: Former Smoker    Packs/day: 1.00    Years: 10.00    Pack years: 10.00    Types: Cigarettes, Pipe, Cigars    Last attempt to quit: 04/18/1972    Years since quitting: 46.4   Smokeless tobacco: Former Systems developer    Types: Chew    Quit date: 04/18/1972  Substance Use Topics   Alcohol use: Yes    Alcohol/week: 4.0 standard drinks    Types: 2 Cans of beer, 2 Shots of liquor per week    Comment: per 2 weeks    Drug use: No     Family Hx: The patient's family history includes Alcohol abuse in his father.  ROS:   Please see the history of present illness.     All other systems reviewed and are negative.   Prior CV studies:   The following studies were reviewed today:    Labs/Other Tests and Data Reviewed:    EKG:  No ECG reviewed.  Recent Labs: 12/21/2017: TSH 2.020 03/02/2018: ALT 5 09/04/2018: BUN 15; Creatinine, Ser 0.87; Hemoglobin 14.5; Platelets 115.0; Potassium 3.9; Pro B Natriuretic peptide (BNP) 168.0; Sodium 138   Recent Lipid Panel Lab Results  Component Value Date/Time   CHOL 146 08/27/2017 04:19 AM   TRIG 134 08/27/2017 04:19 AM   HDL 43 08/27/2017 04:19 AM   CHOLHDL 3.4 08/27/2017 04:19 AM   LDLCALC 76 08/27/2017 04:19 AM    Wt Readings from Last 3 Encounters:  09/21/18 266 lb (120.7 kg)  09/04/18 267 lb 12.8 oz (121.5 kg)  07/09/18  262 lb 12.8 oz (119.2 kg)     Objective:    Vital Signs:  BP (!) 145/77    Pulse 68    Temp (!) 97.4 F (36.3 C)    Ht 5\' 7"  (1.702 m)    Wt 266 lb (120.7 kg)    BMI 41.66 kg/m    VITAL SIGNS:  reviewed  ASSESSMENT & PLAN:    1.coronary artery disease involving native coronary arteries without angina: He is doing well overall.  I recommend continuing medical therapy.  2. Atrial fibrillation: Maintaining in sinus rhythm.  Amiodarone was discontinued due to concerns about possible lung toxicity.  Continue treatment with small dose carvedilol and anticoagulation with Eliquis.  3. Essential hypertension: Blood pressure is reasonably controlled.  4. Hyperlipidemia: He had myalgia with atorvastatin but he is tolerating rosuvastatin.  5.  Chronic diastolic heart failure: He appears to be euvolemic on current dose of furosemide 40 mg twice daily.  His wife did discuss the possibility of switching him to Bumex but he seems to be stable on current treatment  COVID-19 Education: The signs and symptoms of COVID-19 were discussed with the patient and how to seek care for testing (follow up with PCP or arrange E-visit).  The importance of social distancing was discussed today.  Time:   Today, I have spent 13 minutes with the patient with telehealth technology discussing the above problems.     Medication Adjustments/Labs and Tests Ordered: Current medicines are reviewed at length with the patient today.  Concerns regarding medicines are outlined above.   Tests Ordered: No orders of the defined types were placed in this encounter.   Medication Changes: No orders of the defined types were placed in this encounter.   Disposition:  Follow up in 4 month(s)  Signed, [name], MD  09/21/2018 4:46 PM    [facility]

## 2018-09-26 ENCOUNTER — Telehealth: Payer: Self-pay | Admitting: Cardiovascular Disease

## 2018-09-26 NOTE — Telephone Encounter (Signed)
° °  [facility] Pre-operative Risk Assessment    Request for surgical clearance:  1. What type of surgery is being performed? Lt C3 C4 C5 RF Neurotomy    2. When is this surgery scheduled? Tentatively 10/10/18   3. What type of clearance is required (medical clearance vs. Pharmacy clearance to hold med vs. Both)? Pharmacy   4. Are there any medications that need to be held prior to surgery and how long? Eliquis x3 doses prior    5. Practice name and name of physician performing surgery? [facility]: [place], Dr Sharlet Salina  6. What is your office phone number [phone]   7.   What is your office fax number [phone]  8.   Anesthesia type (None, local, MAC, general) ? None listed    Andre [name] 09/26/2018, 4:45 PM  _________________________________________________________________   (provider comments below)

## 2018-09-28 NOTE — Telephone Encounter (Signed)
Pt takes Eliquis for afib with CHADS2VASc score of 5 (age x2, HF, HTN, CAD), also with hx of PE in 2011. Renal function is normal. Pt previously cleared by Dr Fletcher Anon to hold Eliquis for 3 days prior to spinal injections (01/23/18 note). Recommend holding Eliquis 3 days prior to this procedure as well.

## 2018-09-28 NOTE — Telephone Encounter (Addendum)
   Primary Cardiologist: [name], MD  Chart reviewed as part of pre-operative protocol coverage. Patient was contacted 09/28/2018 in reference to pre-operative risk assessment for pending surgery as outlined below.  Andre [name] was just recently seen on 09/21/18 by Dr. Fletcher Anon via telemedicine. Addendm: I tried to contact patient on multiple numbers listed and got VM each time. Will route to Dr. Fletcher Anon to find out if he feels comfortable clearing patient based on that interaction without EKG (last EKG 03/2018). Dr. Fletcher Anon - Please route response to P CV DIV PREOP (the pre-op pool). Thank you.  Anticoag addressed below.  Charlie Pitter, PA-C 09/28/2018, 9:19 AM

## 2018-10-01 NOTE — Telephone Encounter (Signed)
Moderate risk from a cardiac standpoint with no need for further cardiac testing.

## 2018-10-05 ENCOUNTER — Telehealth: Payer: Self-pay | Admitting: Pulmonary Disease

## 2018-10-05 DIAGNOSIS — G4733 Obstructive sleep apnea (adult) (pediatric): Secondary | ICD-10-CM | POA: Diagnosis not present

## 2018-10-05 DIAGNOSIS — J45909 Unspecified asthma, uncomplicated: Secondary | ICD-10-CM | POA: Diagnosis not present

## 2018-10-05 DIAGNOSIS — J449 Chronic obstructive pulmonary disease, unspecified: Secondary | ICD-10-CM

## 2018-10-05 DIAGNOSIS — J441 Chronic obstructive pulmonary disease with (acute) exacerbation: Secondary | ICD-10-CM | POA: Diagnosis not present

## 2018-10-05 NOTE — Telephone Encounter (Signed)
Called & spoke w/ pt's wife, [name] (on [place]). [name] states pt has had Profecto O2 from [employer] sitting in their home for over a year without use and would like an order to d/c it. Pt wife states pt breathing is stable. Reports pt using CPAP, nebulizer and Stiolto inhaler with no issues.  Dr. Vaughan Browner, please advise if you would be ok with [place] putting in an order to d/c this patient's O2. Thank you.

## 2018-10-05 NOTE — Telephone Encounter (Signed)
Pt had a missed call and would like a call back

## 2018-10-05 NOTE — Telephone Encounter (Signed)
Ok to DC O2 order if he is not using it.

## 2018-10-05 NOTE — Telephone Encounter (Signed)
ATC Pat, no answer. Left message for her to call back.

## 2018-10-05 NOTE — Telephone Encounter (Signed)
Called & spoke w/ pt wife regarding Dr. Matilde Bash recommendations. Pt wife verbalized understanding.   Order for d/c O2 has been placed to [employer]. Nothing further needed at this time.

## 2018-10-06 ENCOUNTER — Other Ambulatory Visit: Payer: Self-pay | Admitting: Cardiovascular Disease

## 2018-10-10 DIAGNOSIS — M47812 Spondylosis without myelopathy or radiculopathy, cervical region: Secondary | ICD-10-CM | POA: Diagnosis not present

## 2018-11-04 DIAGNOSIS — J449 Chronic obstructive pulmonary disease, unspecified: Secondary | ICD-10-CM | POA: Diagnosis not present

## 2018-11-04 DIAGNOSIS — J45909 Unspecified asthma, uncomplicated: Secondary | ICD-10-CM | POA: Diagnosis not present

## 2018-11-04 DIAGNOSIS — G4733 Obstructive sleep apnea (adult) (pediatric): Secondary | ICD-10-CM | POA: Diagnosis not present

## 2018-11-04 DIAGNOSIS — J441 Chronic obstructive pulmonary disease with (acute) exacerbation: Secondary | ICD-10-CM | POA: Diagnosis not present

## 2018-11-08 ENCOUNTER — Other Ambulatory Visit: Payer: Self-pay | Admitting: Cardiovascular Disease

## 2018-11-08 DIAGNOSIS — G4733 Obstructive sleep apnea (adult) (pediatric): Secondary | ICD-10-CM | POA: Diagnosis not present

## 2018-11-08 DIAGNOSIS — J449 Chronic obstructive pulmonary disease, unspecified: Secondary | ICD-10-CM | POA: Diagnosis not present

## 2018-11-08 DIAGNOSIS — J441 Chronic obstructive pulmonary disease with (acute) exacerbation: Secondary | ICD-10-CM | POA: Diagnosis not present

## 2018-11-08 DIAGNOSIS — J45909 Unspecified asthma, uncomplicated: Secondary | ICD-10-CM | POA: Diagnosis not present

## 2018-11-09 ENCOUNTER — Other Ambulatory Visit: Payer: Self-pay

## 2018-11-09 ENCOUNTER — Ambulatory Visit (INDEPENDENT_AMBULATORY_CARE_PROVIDER_SITE_OTHER): Payer: PPO | Admitting: Family Medicine

## 2018-11-09 ENCOUNTER — Other Ambulatory Visit: Payer: Self-pay | Admitting: Cardiovascular Disease

## 2018-11-09 ENCOUNTER — Encounter: Payer: Self-pay | Admitting: Family Medicine

## 2018-11-09 VITALS — Ht 67.0 in | Wt 263.0 lb

## 2018-11-09 DIAGNOSIS — F32A Depression, unspecified: Secondary | ICD-10-CM

## 2018-11-09 DIAGNOSIS — R7309 Other abnormal glucose: Secondary | ICD-10-CM | POA: Diagnosis not present

## 2018-11-09 DIAGNOSIS — G20 Parkinson's disease: Secondary | ICD-10-CM | POA: Diagnosis not present

## 2018-11-09 DIAGNOSIS — D696 Thrombocytopenia, unspecified: Secondary | ICD-10-CM | POA: Diagnosis not present

## 2018-11-09 DIAGNOSIS — F419 Anxiety disorder, unspecified: Secondary | ICD-10-CM | POA: Diagnosis not present

## 2018-11-09 DIAGNOSIS — J449 Chronic obstructive pulmonary disease, unspecified: Secondary | ICD-10-CM | POA: Diagnosis not present

## 2018-11-09 DIAGNOSIS — I10 Essential (primary) hypertension: Secondary | ICD-10-CM | POA: Diagnosis not present

## 2018-11-09 DIAGNOSIS — F329 Major depressive disorder, single episode, unspecified: Secondary | ICD-10-CM

## 2018-11-09 DIAGNOSIS — Z7189 Other specified counseling: Secondary | ICD-10-CM | POA: Diagnosis not present

## 2018-11-09 NOTE — Progress Notes (Signed)
Virtual Visit via telephone Note  This visit type was conducted due to national recommendations for restrictions regarding the COVID-19 pandemic (e.g. social distancing).  This format is felt to be most appropriate for this patient at this time.  All issues noted in this document were discussed and addressed.  No physical exam was performed (except for noted visual exam findings with Video Visits).   I connected with Andre [name] today at 11:00 AM EDT by telephone and verified that I am speaking with the correct person using two identifiers. Location patient: home Location provider: work Persons participating in the virtual visit: patient, provider, wife  I discussed the limitations, risks, security and privacy concerns of performing an evaluation and management service by telephone and the availability of in person appointments. I also discussed with the patient that there may be a patient responsible charge related to this service. The patient expressed understanding and agreed to proceed.  Interactive audio and video telecommunications were attempted between this provider and patient, however failed, due to patient having technical difficulties OR patient did not have access to video capability.  We continued and completed visit with audio only.  Reason for visit: follow-up  HPI: HYPERTENSION  Disease Monitoring  Home BP Monitoring not checking Chest pain- no    Dyspnea- stable Medications  Compliance-  Taking lasix, imdur, losartan.  Edema- no  COPD: Medication compliance- taking stioloto Dyspnea- much improved  Wheezing- no  Cough- no   Thrombocytopenia: no excessive bleeding. Some bruising on forearms though no bruising elsewhere.   Elevated glucose: noted on lab work.   Anxiety: under control. Taking lexapro. Notes everyday life gets to him at times though he is dealing with it well. Patient notes the lexapro 20 mg dose made him drowsy.   Parkinson's disease: doing well. Has  a mild tremor later in the day. Continues on his current medications and follows with neurology.  ROS: See pertinent positives and negatives per HPI.  Past Medical History:  Diagnosis Date  . Atherosclerosis of abdominal aorta ([place])   . CAD (coronary artery disease)   . Cervical spondylosis 10/01/2013  . Chronic diastolic CHF (congestive heart failure) ([place])    a. 07/2016 Echo: >55%; b. 10/2016 Echo: EF 55-60%, Gr1 DD, Ao sclerosis w/o stenosis, sev dil LA; c. 08/2017 Echo: EF 60-65%, no rwma, Gr2 DD, mild AS, sev dil LA/RA.  [name] Coronary artery disease    a. 1998 s/p mini-cabg @ [facility] - LIMA->LAD;  b. 07/2016 St Echo: Inadequate HR w/ HTN response;  c.  08/2016 MV: EF 67%, no ischemia; d. 10/2016 NSTEMI/Cath: RCA 95p (4.0x26 Onyx DES), LIMA->LAD nl; e. 09/2017 Cath: LM 40/30, LAD 100ost, RI 80, LCX nl, OM2/3 nl, RCA patent stent, 37m, LIMA->LAD nl-->Med Rx.  . DDD (degenerative disc disease), cervical   . DDD (degenerative disc disease), lumbar   . Depression   . GERD (gastroesophageal reflux disease)   . Hyperlipidemia   . Hypertension   . Hypothyroidism   . PAF (paroxysmal atrial fibrillation) ([facility])    a. s/p DCCV-->maintaining sinus on amiodarone;  b. CHA2DS2VASc = 5-->eliquis.  . Parkinson's disease ([place])    tremors  . Pleural effusion, right    a. 09/2017 s/p thoracentesis.  [name] PNA (pneumonia) 08/26/2017  . Pulmonary embolism ([place]) 2011  . Pulmonary fibrosis ([place])   . 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 Sleep apnea    wears CPAP  . Thrombocytopenia ([place])     Past Surgical History:  Procedure Laterality Date  .  BACK SURGERY  1960  . CARDIAC CATHETERIZATION    . CHOLECYSTECTOMY  2010  . COLONOSCOPY WITH PROPOFOL N/A 06/07/2018   Procedure: COLONOSCOPY WITH PROPOFOL;  Surgeon: Lollie Sails, MD;  Location: [facility] ENDOSCOPY;  Service: Endoscopy;  Laterality: N/A;  . CORONARY ARTERY BYPASS GRAFT  01/07/1997  . CORONARY STENT INTERVENTION N/A 10/31/2016   Procedure: Coronary Stent  Intervention;  Surgeon: Wellington Hampshire, MD;  Location: [facility] CV LAB;  Service: Cardiovascular;  Laterality: N/A;  . ELECTROPHYSIOLOGIC STUDY N/A 07/14/2015   Procedure: CARDIOVERSION;  Surgeon: Yolonda Kida, MD;  Location: [facility] ORS;  Service: Cardiovascular;  Laterality: N/A;  . ELECTROPHYSIOLOGIC STUDY N/A 10/12/2015   Procedure: CARDIOVERSION;  Surgeon: Minna Merritts, MD;  Location: [facility] ORS;  Service: Cardiovascular;  Laterality: N/A;  . LEFT HEART CATH AND CORONARY ANGIOGRAPHY N/A 10/31/2016   Procedure: Left Heart Cath and Coronary Angiography;  Surgeon: Wellington Hampshire, MD;  Location: [place];  Service: Cardiovascular;  Laterality: N/A;  . OTHER SURGICAL HISTORY  1998   Bypass  . RIGHT/LEFT HEART CATH AND CORONARY ANGIOGRAPHY N/A 09/18/2017   Procedure: RIGHT/LEFT HEART CATH AND CORONARY ANGIOGRAPHY;  Surgeon: Wellington Hampshire, MD;  Location: [place] [facility];  Service: Cardiovascular;  Laterality: N/A;    Family History  Problem Relation Age of Onset  . Alcohol abuse Father     SOCIAL HX: former smoker   Current Outpatient Medications:  .  albuterol (PROVENTIL) (2.5 MG/3ML) 0.083% nebulizer solution, TAKE 3MLS BY NEBULIZATION EVERY 4 HOURS AS NEEDED FOR WHEEZING OR SHORTNESS OF BREATH, Disp: 75 mL, Rfl: 1 .  carbidopa-levodopa (SINEMET IR) 25-250 MG tablet, TAKE TWO TABLETS 3 TIMES DAILY (Patient taking differently: 2 tablets 3 (three) times daily. ), Disp: 540 tablet, Rfl: 3 .  carvedilol (COREG) 3.125 MG tablet, TAKE TWO TABLETS TWICE DAILY WITH MEALS, Disp: 120 tablet, Rfl: 3 .  cyclobenzaprine (FLEXERIL) 5 MG tablet, Take 5 mg by mouth daily as needed for muscle spasms. , Disp: , Rfl:  .  ELIQUIS 5 MG TABS tablet, TAKE ONE TABLET BY MOUTH TWICE DAILY, Disp: 60 tablet, Rfl: 5 .  entacapone (COMTAN) 200 MG tablet, Take 1 tablet (200 mg total) by mouth 3 (three) times daily., Disp: 270 tablet, Rfl: 3 .  escitalopram (LEXAPRO) 20 MG tablet, Take 1  tablet (20 mg total) by mouth daily. (Patient taking differently: Take 10 mg by mouth daily. ), Disp: 90 tablet, Rfl: 1 .  esomeprazole (NEXIUM) 20 MG capsule, Take 48 mg by mouth daily at 12 noon. , Disp: , Rfl:  .  fluticasone (FLONASE) 50 MCG/ACT nasal spray, TAKE 2 PUFFS IN EACH NOSTRIL DAILY, Disp: 16 g, Rfl: 3 .  furosemide (LASIX) 20 MG tablet, TAKE TWO TABLETS TWICE A DAY, Disp: 120 tablet, Rfl: 2 .  HYDROcodone-acetaminophen (NORCO/VICODIN) 5-325 MG tablet, Take by mouth., Disp: , Rfl:  .  isosorbide mononitrate (IMDUR) 30 MG 24 hr tablet, TAKE 1 TABLET BY MOUTH DAILY, Disp: 30 tablet, Rfl: 5 .  ketoconazole (NIZORAL) 2 % cream, , Disp: , Rfl:  .  levothyroxine (SYNTHROID, LEVOTHROID) 50 MCG tablet, TAKE 1 TABLET EVERY DAY ON EMPTY STOMACHWITH A GLASS OF WATER AT LEAST 30-60 MINBEFORE BREAKFAST, Disp: 90 tablet, Rfl: 1 .  losartan (COZAAR) 25 MG tablet, TAKE 1 TABLET BY MOUTH DAILY, Disp: 90 tablet, Rfl: 0 .  mirabegron ER (MYRBETRIQ) 50 MG TB24 tablet, Take 1 tablet (50 mg total) by mouth daily., Disp: 30 tablet,  Rfl: 11 .  modafinil (PROVIGIL) 200 MG tablet, TAKE 1 TABLET BY MOUTH TWICE DAILY, Disp: 180 tablet, Rfl: 1 .  mometasone (ELOCON) 0.1 % cream, , Disp: , Rfl:  .  Polyethyl Glycol-Propyl Glycol (SYSTANE OP), Place 1 drop into both eyes 2 (two) times daily as needed (dry eyes)., Disp: , Rfl:  .  potassium chloride SA (K-DUR) 20 MEQ tablet, TAKE ONE TABLET EVERY DAY, Disp: 90 tablet, Rfl: 0 .  rosuvastatin (CRESTOR) 10 MG tablet, TAKE ONE TABLET EVERY DAY, Disp: 90 tablet, Rfl: 1 .  Tiotropium Bromide-Olodaterol (STIOLTO RESPIMAT) 2.5-2.5 MCG/ACT AERS, Inhale 2 puffs into the lungs daily., Disp: 1 Inhaler, Rfl: 5  EXAM: This was a telehealth telephone visit and thus no physical exam was completed.   ASSESSMENT AND PLAN:  Discussed the following assessment and plan:  Essential hypertension Has been relatively stable. He will continue his current medications.   COPD (chronic  obstructive pulmonary disease) ([place]) Much improved on new regimen.  He will continue with this regimen.   Parkinson's disease ([place]) Stable. He will continue to follow with neurology.   Anxiety and depression Stable. Continue lexapro.   Thrombocytopenia ([place]) Has trended down again. Plan to recheck in the next 1-2 weeks.   Elevated glucose Check A1c.   Educated About Covid-19 Virus Infection Patient reports he is considering going to the gun range with his son in law today. I discussed that there was risk to this and that I would recommend that he avoid any activities that place him in close proximity to anyone outside of his household. Discussed that there was risk of contracting COVID19 any time he left the house. Advised strict mask usage and social distancing. He needs to frequently wash his hands as well. If he gets sick he will let us know right away to coordinate testing and quarantine.     I discussed the assessment and treatment plan with the patient. The patient was provided an opportunity to ask questions and all were answered. The patient agreed with the plan and demonstrated an understanding of the instructions.   The patient was advised to call back or seek an in-person evaluation if the symptoms worsen or if the condition fails to improve as anticipated.  I provided 24 minutes of non-face-to-face time during this encounter.   [name], MD

## 2018-11-09 NOTE — Assessment & Plan Note (Signed)
Stable. He will continue to follow with neurology.

## 2018-11-09 NOTE — Assessment & Plan Note (Signed)
Has trended down again. Plan to recheck in the next 1-2 weeks.

## 2018-11-09 NOTE — Assessment & Plan Note (Signed)
Check A1c. 

## 2018-11-09 NOTE — Assessment & Plan Note (Signed)
Much improved on new regimen.  He will continue with this regimen.

## 2018-11-09 NOTE — Assessment & Plan Note (Signed)
Has been relatively stable. He will continue his current medications.

## 2018-11-09 NOTE — Assessment & Plan Note (Signed)
Patient reports he is considering going to the gun range with his son in law today. I discussed that there was risk to this and that I would recommend that he avoid any activities that place him in close proximity to anyone outside of his household. Discussed that there was risk of contracting COVID19 any time he left the house. Advised strict mask usage and social distancing. He needs to frequently wash his hands as well. If he gets sick he will let us know right away to coordinate testing and quarantine.

## 2018-11-09 NOTE — Assessment & Plan Note (Signed)
Stable.  Continue lexapro.  

## 2018-11-12 ENCOUNTER — Telehealth: Payer: Self-pay | Admitting: Family Medicine

## 2018-11-12 NOTE — Telephone Encounter (Signed)
I called pt and left vm to call ofc to sch 2 weeks (around 11/23/2018) for labs, follow-up pcp 4 months.

## 2018-11-15 ENCOUNTER — Other Ambulatory Visit: Payer: Self-pay | Admitting: Cardiovascular Disease

## 2018-11-16 ENCOUNTER — Other Ambulatory Visit: Payer: Self-pay | Admitting: Pulmonary Disease

## 2018-11-16 DIAGNOSIS — M47812 Spondylosis without myelopathy or radiculopathy, cervical region: Secondary | ICD-10-CM | POA: Diagnosis not present

## 2018-11-16 DIAGNOSIS — M5412 Radiculopathy, cervical region: Secondary | ICD-10-CM | POA: Diagnosis not present

## 2018-11-16 DIAGNOSIS — M5030 Other cervical disc degeneration, unspecified cervical region: Secondary | ICD-10-CM | POA: Diagnosis not present

## 2018-11-16 DIAGNOSIS — M62838 Other muscle spasm: Secondary | ICD-10-CM | POA: Diagnosis not present

## 2018-11-16 DIAGNOSIS — M4802 Spinal stenosis, cervical region: Secondary | ICD-10-CM | POA: Diagnosis not present

## 2018-11-23 ENCOUNTER — Telehealth: Payer: Self-pay | Admitting: Family Medicine

## 2018-11-23 NOTE — Telephone Encounter (Addendum)
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 He states he was never treated for TB and he has had multiple imaging studies recently have not revealed any findings of active TB. He wonders if he should be tested for this. He has no cough or fevers, though notes he has had night sweats for years.  Would we be able to bring the patient into the lab to do a QuantiFERON gold with his chronic night sweats?

## 2018-11-26 NOTE — Telephone Encounter (Signed)
IT is up to you either way, if we bring him invand he is screened and does not pass COVID due to chronic nature it would be your call as provider. Either way wold need to isolate and to usee PPE even for draw we are screening past positive patient for TB. We could draw in isolation room?

## 2018-11-27 ENCOUNTER — Other Ambulatory Visit (INDEPENDENT_AMBULATORY_CARE_PROVIDER_SITE_OTHER): Payer: PPO

## 2018-11-27 ENCOUNTER — Other Ambulatory Visit: Payer: Self-pay

## 2018-11-27 DIAGNOSIS — R7309 Other abnormal glucose: Secondary | ICD-10-CM | POA: Diagnosis not present

## 2018-11-27 DIAGNOSIS — D696 Thrombocytopenia, unspecified: Secondary | ICD-10-CM | POA: Diagnosis not present

## 2018-11-27 LAB — CBC
HCT: 44 % (ref 39.0–52.0)
Hemoglobin: 14.2 g/dL (ref 13.0–17.0)
MCHC: 32.4 g/dL (ref 30.0–36.0)
MCV: 89 fl (ref 78.0–100.0)
Platelets: 106 10*3/uL — ABNORMAL LOW (ref 150.0–400.0)
RBC: 4.94 Mil/uL (ref 4.22–5.81)
RDW: 15.9 % — ABNORMAL HIGH (ref 11.5–15.5)
WBC: 9.4 10*3/uL (ref 4.0–10.5)

## 2018-11-27 LAB — HEMOGLOBIN A1C: Hgb A1c MFr Bld: 6.4 % (ref 4.6–6.5)

## 2018-11-27 NOTE — Telephone Encounter (Signed)
Pt returned call and was given the phone numbers listed for [facility] department to schedule screening for TB testing. He verbalized understanding.

## 2018-11-27 NOTE — Telephone Encounter (Signed)
Can you let the patient know that we discussed the potential TB test and that it would be best if he contacted the [facility] to schedule an appointment to have a screening test to be completed for TB? He could try one of the following numbers to get that scheduled. [phone] or [phone].

## 2018-11-27 NOTE — Telephone Encounter (Signed)
Left detailed message for patient to return call to office. PEC nurse may advise.

## 2018-12-05 ENCOUNTER — Other Ambulatory Visit: Payer: Self-pay | Admitting: Cardiovascular Disease

## 2018-12-05 DIAGNOSIS — J441 Chronic obstructive pulmonary disease with (acute) exacerbation: Secondary | ICD-10-CM | POA: Diagnosis not present

## 2018-12-05 DIAGNOSIS — J449 Chronic obstructive pulmonary disease, unspecified: Secondary | ICD-10-CM | POA: Diagnosis not present

## 2018-12-05 DIAGNOSIS — J45909 Unspecified asthma, uncomplicated: Secondary | ICD-10-CM | POA: Diagnosis not present

## 2018-12-05 DIAGNOSIS — G4733 Obstructive sleep apnea (adult) (pediatric): Secondary | ICD-10-CM | POA: Diagnosis not present

## 2018-12-11 ENCOUNTER — Encounter: Payer: Self-pay | Admitting: Family Medicine

## 2018-12-11 ENCOUNTER — Ambulatory Visit (INDEPENDENT_AMBULATORY_CARE_PROVIDER_SITE_OTHER): Payer: PPO | Admitting: Family Medicine

## 2018-12-11 ENCOUNTER — Other Ambulatory Visit: Payer: Self-pay

## 2018-12-11 VITALS — BP 164/84 | HR 68 | Temp 98.4°F | Wt 271.0 lb

## 2018-12-11 DIAGNOSIS — Z23 Encounter for immunization: Secondary | ICD-10-CM | POA: Diagnosis not present

## 2018-12-11 DIAGNOSIS — M109 Gout, unspecified: Secondary | ICD-10-CM | POA: Diagnosis not present

## 2018-12-11 MED ORDER — COLCHICINE 0.6 MG PO TABS: ORAL_TABLET | ORAL | 0 refills | Status: DC

## 2018-12-11 NOTE — Progress Notes (Signed)
Subjective:    Patient ID: Andre [name], male    DOB: [birth year], 83 y.o.   MRN: [id number]  HPI   Patient presents to clinic due to pain, redness and swelling in the left great toe.  Patient states he noticed it a little bit yesterday, but it was worse this morning.  Tried elevating the leg and putting ice pack on it but that did not seem to help the pain too much.  Also notices pain in toe when he takes a step with left foot, or slippers today as there is most comfortable shoe.  Denies fever or chills.  Denies any known injury to the foot or ankle.  Denies redness spreading any other place but her leg, it is concentrated to just left great toe.    Patient Active Problem List   Diagnosis Date Noted  . Elevated glucose 11/09/2018  . Educated About Covid-19 Virus Infection 11/09/2018  . COPD (chronic obstructive pulmonary disease) ([place]) 07/09/2018  . Hematuria 03/09/2018  . Neck pain 03/09/2018  . Myalgia 03/09/2018  . Decreased hearing of both ears 03/09/2018  . Muscle cramps 11/02/2017  . Renal cyst 10/02/2017  . Pleural effusion 09/18/2017  . Diarrhea 08/21/2017  . Anemia 08/21/2017  . Acute respiratory failure ([place]) 08/11/2017  . Renal lesion 07/08/2017  . Fall 05/29/2017  . Lightheadedness 05/29/2017  . Abrasion 02/08/2017  . Anxiety and depression 01/05/2017  . Prediabetes 10/27/2016  . Hypothyroidism 10/27/2016  . (HFpEF) heart failure with preserved ejection fraction ([place]) 09/27/2016  . Parkinson's disease ([place]) 06/30/2016  . Thrombocytopenia ([place]) 01/29/2016  . PAF (paroxysmal atrial fibrillation) ([employer])   . BMI 40.0-44.9, adult ([place]) 05/06/2015  . Erythrocytosis 01/28/2015  . Atherosclerosis of abdominal aorta ([place]) 12/08/2014  . Barrett's esophagus 12/31/2013  . DDD (degenerative disc disease), cervical 10/01/2013  . DDD (degenerative disc disease), lumbar 10/01/2013  . Coronary artery disease involving native coronary artery of native heart with angina  pectoris ([place]) 10/30/2012  . GERD (gastroesophageal reflux disease) 10/30/2012  . Hyperlipidemia with target LDL less than 70 10/30/2012  . Essential hypertension 10/30/2012  . Dyspnea 09/10/2012  . Allergic rhinitis 09/10/2012  . OSA (obstructive sleep apnea) 09/10/2012   Social History   Tobacco Use  . Smoking status: Former Smoker    Packs/day: 1.00    Years: 10.00    Pack years: 10.00    Types: Cigarettes, Pipe, Cigars    Quit date: 04/18/1972    Years since quitting: 46.6  . Smokeless tobacco: Former Systems developer    Types: Chew    Quit date: 04/18/1972  Substance Use Topics  . Alcohol use: Yes    Alcohol/week: 4.0 standard drinks    Types: 2 Cans of beer, 2 Shots of liquor per week    Comment: per 2 weeks    Review of Systems   Constitutional: Negative for chills, fatigue and fever.  HENT: Negative for congestion, ear pain, sinus pain and sore throat.   Eyes: Negative.   Respiratory: Negative for cough, shortness of breath and wheezing.   Cardiovascular: Negative for chest pain, palpitations and leg swelling.  Gastrointestinal: Negative for abdominal pain, diarrhea, nausea and vomiting.  Genitourinary: Negative for dysuria, frequency and urgency.  Musculoskeletal: left great toe red/tender Skin: Negative for color change, pallor and rash.  Neurological: Negative for syncope, light-headedness and headaches.  Psychiatric/Behavioral: The patient is not nervous/anxious.       Objective:   Physical Exam Vitals signs and nursing note reviewed.  Constitutional:  General: He is not in acute distress.    Appearance: He is obese. He is not toxic-appearing.  HENT:     Head: Normocephalic and atraumatic.  Cardiovascular:     Rate and Rhythm: Normal rate and regular rhythm.  Pulmonary:     Effort: Pulmonary effort is normal. No respiratory distress.     Breath sounds: Normal breath sounds.  Musculoskeletal:     Right lower leg: No edema.     Left lower leg: No edema.        Feet:     Comments: +pain, warmth and redness location indicated by red circle on diagram. Able to wiggle all toes. Light touch sensation in toes/foot/LE intact.  No redness spreading up leg or foot.  Skin:    General: Skin is warm and dry.     Findings: Erythema (left great toe) present.  Neurological:     Mental Status: He is alert and oriented to person, place, and time.     Gait: Gait abnormal (walks with slight limp due to pain in left great toe).  Psychiatric:        Mood and Affect: Mood normal.        Behavior: Behavior normal.     Today's Vitals   12/11/18 1348  BP: (!) 164/84  Pulse: 68  Temp: 98.4 F (36.9 C)  TempSrc: Temporal  SpO2: 95%  Weight: 271 lb (122.9 kg)   Body mass index is 42.44 kg/m.     Assessment & Plan:    Acute gout of left great toe - patient's symptoms and physical exam are most consistent with acute gout flareup in the left great toe.  We will treat with Colcrys 2 tablets x 1, then 1 tablet 1 hour later.  Advised afterwards he can use Tylenol as needed for pain, also recommended trying a warm foot bath or a warm pack on the foot to help reduce pain and treat uric acid crystals that cause gout.  Elevate leg when sitting to help improve pain.  Advised patient that if he has gout flareups on occasion, we can treat them when they flareup, if he gets gout flareups more often we at that point can consider a daily preventative medication.    High-dose flu vaccine given in clinic today.  Patient will keep all regular scheduled follow-ups with PCP and specialists as scheduled.  He is aware he can call office anytime if questions or concerns.

## 2018-12-11 NOTE — Patient Instructions (Signed)
Try a warm bath or warm pack on left toe to soothe pain  Can elevate leg/foot up on pillow   Can use tylenol as needed for pain

## 2018-12-14 ENCOUNTER — Other Ambulatory Visit: Payer: Self-pay

## 2018-12-17 ENCOUNTER — Inpatient Hospital Stay (HOSPITAL_BASED_OUTPATIENT_CLINIC_OR_DEPARTMENT_OTHER): Payer: PPO | Admitting: Internal Medicine

## 2018-12-17 ENCOUNTER — Other Ambulatory Visit: Payer: Self-pay

## 2018-12-17 ENCOUNTER — Encounter: Payer: Self-pay | Admitting: Internal Medicine

## 2018-12-17 ENCOUNTER — Inpatient Hospital Stay: Payer: PPO | Attending: Internal Medicine

## 2018-12-17 DIAGNOSIS — I480 Paroxysmal atrial fibrillation: Secondary | ICD-10-CM | POA: Diagnosis not present

## 2018-12-17 DIAGNOSIS — D751 Secondary polycythemia: Secondary | ICD-10-CM | POA: Insufficient documentation

## 2018-12-17 DIAGNOSIS — Z7901 Long term (current) use of anticoagulants: Secondary | ICD-10-CM | POA: Insufficient documentation

## 2018-12-17 DIAGNOSIS — D696 Thrombocytopenia, unspecified: Secondary | ICD-10-CM | POA: Diagnosis not present

## 2018-12-17 DIAGNOSIS — Z79899 Other long term (current) drug therapy: Secondary | ICD-10-CM | POA: Insufficient documentation

## 2018-12-17 LAB — CBC WITH DIFFERENTIAL/PLATELET
Abs Immature Granulocytes: 0.02 10*3/uL (ref 0.00–0.07)
Basophils Absolute: 0.1 10*3/uL (ref 0.0–0.1)
Basophils Relative: 1 %
Eosinophils Absolute: 0.1 10*3/uL (ref 0.0–0.5)
Eosinophils Relative: 1 %
HCT: 46.8 % (ref 39.0–52.0)
Hemoglobin: 14.9 g/dL (ref 13.0–17.0)
Immature Granulocytes: 0 %
Lymphocytes Relative: 18 %
Lymphs Abs: 1.9 10*3/uL (ref 0.7–4.0)
MCH: 28.4 pg (ref 26.0–34.0)
MCHC: 31.8 g/dL (ref 30.0–36.0)
MCV: 89.1 fL (ref 80.0–100.0)
Monocytes Absolute: 1.1 10*3/uL — ABNORMAL HIGH (ref 0.1–1.0)
Monocytes Relative: 10 %
Neutro Abs: 7.7 10*3/uL (ref 1.7–7.7)
Neutrophils Relative %: 70 %
Platelets: 137 10*3/uL — ABNORMAL LOW (ref 150–400)
RBC: 5.25 MIL/uL (ref 4.22–5.81)
RDW: 14.6 % (ref 11.5–15.5)
WBC: 11 10*3/uL — ABNORMAL HIGH (ref 4.0–10.5)
nRBC: 0 % (ref 0.0–0.2)

## 2018-12-17 LAB — COMPREHENSIVE METABOLIC PANEL
ALT: 10 U/L (ref 0–44)
AST: 24 U/L (ref 15–41)
Albumin: 4 g/dL (ref 3.5–5.0)
Alkaline Phosphatase: 97 U/L (ref 38–126)
Anion gap: 9 (ref 5–15)
BUN: 19 mg/dL (ref 8–23)
CO2: 27 mmol/L (ref 22–32)
Calcium: 8.9 mg/dL (ref 8.9–10.3)
Chloride: 102 mmol/L (ref 98–111)
Creatinine, Ser: 0.97 mg/dL (ref 0.61–1.24)
GFR calc Af Amer: >60 mL/min (ref >60)
GFR calc non Af Amer: >60 mL/min (ref >60)
Glucose, Bld: 134 mg/dL — ABNORMAL HIGH (ref 70–99)
Potassium: 4.1 mmol/L (ref 3.5–5.1)
Sodium: 138 mmol/L (ref 135–145)
Total Bilirubin: 0.7 mg/dL (ref 0.3–1.2)
Total Protein: 7.3 g/dL (ref 6.5–8.1)

## 2018-12-17 NOTE — Assessment & Plan Note (Addendum)
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  Stable  # Today hemoglobin is 13; HCT -46;  HOLD phlebotomy.   # Mild thrombocytopenia platelets 137;   question secondary to alcohol versus liver disease.vs ITP. monitor stable  # Chronic neck pain/ joint pain- on alcohol; stable recommend moderation.   # CAD/-stenting [Dr.Arida] A. Fib- on Eliquis per cardiology/ see above.  Stable  # spoke to his wife; the above plan of care.  # DISPOSITION:  # NO Phlebotomy today # Follow up in  6 months-MD labs- cbc/cmp- Dr.B  Cc; Dr.Sonnenberg

## 2018-12-17 NOTE — Progress Notes (Signed)
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 ? Alcohol/fatty liver/? [April 2016];2016- CT/US- NED  # PE [Feb 2011 s/p chole; [facility] in [place], [place].] currently off Coumadin; April 2017- Afib- Start eliquis [Dr.Arida]; Parkinsons [id number]; chronic CHF.  INTERVAL HISTORY:  83 year old male patient with above history of secondary erythrocytosis from prior testosterone therapy/CPAP; intermittent thrombocytopenia and history of A. fib on Elquis here for follow-up.  Patient states that he has been doing fairly well.  He continues to drink alcohol on a daily basis.  Denies any chest pain or shortness of breath or cough.  Denies any strokes.  No falls.   Review of Systems  Constitutional: Negative for chills, diaphoresis, fever, malaise/fatigue and weight loss.  HENT: Negative for nosebleeds and sore throat.   Eyes: Negative for double vision.  Respiratory: Negative for cough, hemoptysis, sputum production, shortness of breath and wheezing.   Cardiovascular: Negative for chest pain, palpitations, orthopnea and leg swelling.  Gastrointestinal: Negative for abdominal pain, blood in stool, constipation, diarrhea, heartburn, melena, nausea and vomiting.  Genitourinary: Negative for dysuria, frequency and urgency.  Musculoskeletal: Positive for back pain and joint pain.  Skin: Negative.  Negative for itching and rash.  Neurological: Negative for dizziness, tingling, focal weakness, weakness and headaches.  Endo/Heme/Allergies: Does not bruise/bleed easily.  Psychiatric/Behavioral: Negative for depression. The patient is not nervous/anxious and does not  have insomnia.      PAST MEDICAL HISTORY :  Past Medical History:  Diagnosis Date  . Atherosclerosis of abdominal aorta ([place])   . CAD (coronary artery disease)   . Cervical spondylosis 10/01/2013  . Chronic diastolic CHF (congestive heart failure) ([place])    a. 07/2016 Echo: >55%; b. 10/2016 Echo: EF 55-60%, Gr1 DD, Ao sclerosis w/o stenosis, sev dil LA; c. 08/2017 Echo: EF 60-65%, no rwma, Gr2 DD, mild AS, sev dil LA/RA.  [name] Coronary artery disease    a. 1998 s/p mini-cabg @ [facility] - LIMA->LAD;  b. 07/2016 St Echo: Inadequate HR w/ HTN response;  c.  08/2016 MV: EF 67%, no ischemia; d. 10/2016 NSTEMI/Cath: RCA 95p (4.0x26 Onyx DES), LIMA->LAD nl; e. 09/2017 Cath: LM 40/30, LAD 100ost, RI 80, LCX nl, OM2/3 nl, RCA patent stent, 39m, LIMA->LAD nl-->Med Rx.  . DDD (degenerative disc disease), cervical   . DDD (degenerative disc disease), lumbar   . Depression   . GERD (gastroesophageal reflux disease)   . Hyperlipidemia   . Hypertension   . Hypothyroidism   . PAF (paroxysmal atrial fibrillation) ([facility])    a. s/p DCCV-->maintaining sinus on amiodarone;  b. CHA2DS2VASc = 5-->eliquis.  . Parkinson's disease ([place])    tremors  . Pleural effusion, right    a. 09/2017 s/p thoracentesis.  [name] PNA (pneumonia) 08/26/2017  . Pulmonary embolism ([place]) 2011  . Pulmonary fibrosis ([place])   . 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 Sleep apnea    wears CPAP  . Thrombocytopenia ([place])     PAST SURGICAL HISTORY :   Past Surgical History:  Procedure Laterality Date  . BACK SURGERY  1960  . CARDIAC CATHETERIZATION    . CHOLECYSTECTOMY  2010  . COLONOSCOPY WITH PROPOFOL N/A 06/07/2018   Procedure: COLONOSCOPY WITH PROPOFOL;  Surgeon: Loistine Simas  U, MD;  Location: [facility] END[facility]COPY;  Service: Endoscopy;  Laterality: N/A;  . CORONARY ARTERY BYPASS GRAFT  01/07/1997  . CORONARY STENT INTERVENTION N/A 10/31/2016   Procedure: Coronary Stent Intervention;  Surgeon: Wellington Hampshire, MD;  Location: [facility];   Service: Cardiovascular;  Laterality: N/A;  . ELECTROPHYSIOLOGIC STUDY N/A 07/14/2015   Procedure: CARDIOVERSION;  Surgeon: Yolonda Kida, MD;  Location: [facility] OR[facility];  Service: Cardiovascular;  Laterality: N/A;  . ELECTROPHYSIOLOGIC STUDY N/A 10/12/2015   Procedure: CARDIOVERSION;  Surgeon: Minna Merritts, MD;  Location: [facility] OR[facility];  Service: Cardiovascular;  Laterality: N/A;  . LEFT HEART CATH AND CORONARY ANGIOGRAPHY N/A 10/31/2016   Procedure: Left Heart Cath and Coronary Angiography;  Surgeon: Wellington Hampshire, MD;  Location: [facility];  Service: Cardiovascular;  Laterality: N/A;  . OTHER SURGICAL HISTORY  1998   Bypass  . RIGHT/LEFT HEART CATH AND CORONARY ANGIOGRAPHY N/A 09/18/2017   Procedure: RIGHT/LEFT HEART CATH AND CORONARY ANGIOGRAPHY;  Surgeon: Wellington Hampshire, MD;  Location: [facility];  Service: Cardiovascular;  Laterality: N/A;    FAMILY HISTORY :   Family History  Problem Relation Age of Onset  . Alcohol abuse Father     SOCIAL HISTORY:   Social History   Tobacco Use  . Smoking status: Former Smoker    Packs/day: 1.00    Years: 10.00    Pack years: 10.00    Types: Cigarettes, Pipe, Cigars    Quit date: 04/18/1972    Years since quitting: 46.6  . Smokeless tobacco: Former Systems developer    Types: Chew    Quit date: 04/18/1972  Substance Use Topics  . Alcohol use: Yes    Alcohol/week: 4.0 standard drinks    Types: 2 Cans of beer, 2 Shots of liquor per week    Comment: per 2 weeks   . Drug use: No    ALLERGIES:  is allergic to pravastatin and prednisone.  MEDICATIONS:  Current Outpatient Medications  Medication Sig Dispense Refill  . albuterol (PROVENTIL) (2.5 MG/3ML) 0.083% nebulizer solution USE ONE AMPULE (3ML) IN NEBULIZER EVERY FOUR HOURS AS NEEDED FOR WHEEZING OR SHORTNESS OF BREATH 360 mL 1  . carbidopa-levodopa (SINEMET IR) 25-250 MG tablet TAKE TWO TABLETS 3 TIMES DAILY (Patient taking differently: 2 tablets 3 (three) times daily. ) 540  tablet 3  . carvedilol (COREG) 3.125 MG tablet TAKE TWO TABLETS TWICE DAILY WITH MEALS 120 tablet 3  . cyclobenzaprine (FLEXERIL) 5 MG tablet Take 5 mg by mouth daily as needed for muscle spasms.     [name] ELIQUIS 5 MG TABS tablet TAKE ONE TABLET BY MOUTH TWICE DAILY 60 tablet 5  . entacapone (COMTAN) 200 MG tablet Take 1 tablet (200 mg total) by mouth 3 (three) times daily. 270 tablet 3  . escitalopram (LEXAPRO) 20 MG tablet Take 1 tablet (20 mg total) by mouth daily. (Patient taking differently: Take 10 mg by mouth daily. ) 90 tablet 1  . esomeprazole (NEXIUM) 20 MG capsule Take 48 mg by mouth daily at 12 noon.     . fluticasone (FLONASE) 50 MCG/ACT nasal spray TAKE 2 PUFFS IN EACH NOSTRIL DAILY 16 g 3  . furosemide (LASIX) 20 MG tablet TAKE TWO TABLETS BY MOUTH TWICE DAILY 120 tablet 2  . HYDROcodone-acetaminophen (NORCO/VICODIN) 5-325 MG tablet Take by mouth.    . isosorbide mononitrate (IMDUR) 30 MG 24 hr tablet TAKE 1 TABLET BY MOUTH DAILY 30 tablet 5  . ketoconazole (NIZORAL) 2 %  cream     . levothyroxine (SYNTHROID, LEVOTHROID) 50 MCG tablet TAKE 1 TABLET EVERY DAY ON EMPTY STOMACHWITH A GLASS OF WATER AT LEAST 30-60 MINBEFORE BREAKFAST 90 tablet 1  . losartan (COZAAR) 25 MG tablet TAKE ONE TABLET EVERY DAY 90 tablet 0  . mirabegron ER (MYRBETRIQ) 50 MG TB24 tablet Take 1 tablet (50 mg total) by mouth daily. 30 tablet 11  . modafinil (PROVIGIL) 200 MG tablet TAKE 1 TABLET BY MOUTH TWICE DAILY 180 tablet 1  . mometasone (ELOCON) 0.1 % cream     . Polyethyl Glycol-Propyl Glycol (SYSTANE OP) Place 1 drop into both eyes 2 (two) times daily as needed (dry eyes).    . potassium chloride SA (K-DUR) 20 MEQ tablet TAKE ONE TABLET EVERY DAY 90 tablet 0  . rosuvastatin (CRESTOR) 10 MG tablet TAKE ONE TABLET EVERY DAY 90 tablet 0  . Tiotropium Bromide-Olodaterol (STIOLTO RESPIMAT) 2.5-2.5 MCG/ACT AERS Inhale 2 puffs into the lungs daily. 1 Inhaler 5   No current facility-administered medications for  this visit.     PHYSICAL EXAMINATION:   BP (!) 162/75 (BP Location: Left Arm, Patient Position: Sitting, Cuff Size: Normal)   Pulse 62   Temp 97.6 F (36.4 C) (Tympanic)   Wt 269 lb (122 kg)   BMI 42.13 kg/m   Filed Weights   12/17/18 1353  Weight: 269 lb (122 kg)    Physical Exam  Constitutional: He is oriented to person, place, and time and well-developed, well-nourished, and in no distress.  Accompanied by his wife.  HENT:  Head: Normocephalic and atraumatic.  Mouth/Throat: Oropharynx is clear and moist. No oropharyngeal exudate.  Eyes: Pupils are equal, round, and reactive to light.  Neck: Normal range of motion. Neck supple.  Cardiovascular: Normal rate and regular rhythm.  Pulmonary/Chest: No respiratory distress. He has no wheezes.  Abdominal: Soft. Bowel sounds are normal. He exhibits no distension and no mass. There is no abdominal tenderness. There is no rebound and no guarding.  Musculoskeletal: Normal range of motion.        General: No tenderness or edema.  Neurological: He is alert and oriented to person, place, and time.  Skin: Skin is warm.  Psychiatric: Affect normal.     LABORATORY DATA:  I have reviewed the data as listed    Component Value Date/Time   NA 138 12/17/2018 1327   NA 139 10/09/2017 1441   K 4.1 12/17/2018 1327   CL 102 12/17/2018 1327   CO2 27 12/17/2018 1327   GLUCOSE 134 (H) 12/17/2018 1327   BUN 19 12/17/2018 1327   BUN 23 10/09/2017 1441   CREATININE 0.97 12/17/2018 1327   CREATININE 0.95 12/06/2011 1554   CALCIUM 8.9 12/17/2018 1327   PROT 7.3 12/17/2018 1327   PROT 7.0 12/03/2015 0842   PROT 7.6 09/20/2011 1529   ALBUMIN 4.0 12/17/2018 1327   ALBUMIN 4.2 12/03/2015 0842   ALBUMIN 3.7 09/20/2011 1529   AST 24 12/17/2018 1327   AST 18 09/20/2011 1529   ALT 10 12/17/2018 1327   ALT 23 09/20/2011 1529   ALKPHOS 97 12/17/2018 1327   ALKPHOS 133 09/20/2011 1529   BILITOT 0.7 12/17/2018 1327   BILITOT 0.8 12/03/2015 0842    BILITOT 0.5 09/20/2011 1529   GFRNONAA >60 12/17/2018 1327   GFRNONAA >60 12/06/2011 1554   GFRAA >60 12/17/2018 1327   GFRAA >60 12/06/2011 1554    No results found for: SPEP, UPEP  Lab Results  Component Value Date  WBC 11.0 (H) 12/17/2018   NEUTROABS 7.7 12/17/2018   HGB 14.9 12/17/2018   HCT 46.8 12/17/2018   MCV 89.1 12/17/2018   PLT 137 (L) 12/17/2018      Chemistry      Component Value Date/Time   NA 138 12/17/2018 1327   NA 139 10/09/2017 1441   K 4.1 12/17/2018 1327   CL 102 12/17/2018 1327   CO2 27 12/17/2018 1327   BUN 19 12/17/2018 1327   BUN 23 10/09/2017 1441   CREATININE 0.97 12/17/2018 1327   CREATININE 0.95 12/06/2011 1554      Component Value Date/Time   CALCIUM 8.9 12/17/2018 1327   ALKPHOS 97 12/17/2018 1327   ALKPHOS 133 09/20/2011 1529   AST 24 12/17/2018 1327   AST 18 09/20/2011 1529   ALT 10 12/17/2018 1327   ALT 23 09/20/2011 1529   BILITOT 0.7 12/17/2018 1327   BILITOT 0.8 12/03/2015 0842   BILITOT 0.5 09/20/2011 1529        ASSESSMENT & PLAN:   Erythrocytosis # 2011- 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  Stable  # Today hemoglobin is 13; HCT -46;  HOLD phlebotomy.   # Mild thrombocytopenia platelets 137;   question secondary to alcohol versus liver disease.vs ITP. monitor stable  # Chronic neck pain/ joint pain- on alcohol; stable recommend moderation.   # CAD/-stenting [Dr.Arida] A. Fib- on Eliquis per cardiology/ see above.  Stable  # spoke to his wife; the above plan of care.  # DISPOSITION:  # NO Phlebotomy today # Follow up in  6 months-MD labs- cbc/cmp- Dr.B  Cc; Dr.Sonnenberg     Cammie Sickle, MD 12/18/2018 8:14 AM

## 2018-12-31 ENCOUNTER — Other Ambulatory Visit: Payer: Self-pay

## 2018-12-31 ENCOUNTER — Ambulatory Visit (INDEPENDENT_AMBULATORY_CARE_PROVIDER_SITE_OTHER): Payer: PPO

## 2018-12-31 DIAGNOSIS — Z0000 Encounter for general adult medical examination without abnormal findings: Secondary | ICD-10-CM

## 2018-12-31 NOTE — Progress Notes (Signed)
Subjective:   Andre [name] is a 83 y.o. male who presents for Medicare Annual/Subsequent preventive examination.  Review of Systems:  No ROS.  Medicare Wellness Virtual Visit.  Visual/audio telehealth visit, UTA vital signs.   See social history for additional risk factors.   Cardiac Risk Factors include: advanced age (>39men, >16 women);male gender;hypertension     Objective:    Vitals: There were no vitals taken for this visit.  There is no height or weight on file to calculate BMI.  Advanced Directives 12/31/2018 06/07/2018 03/02/2018 12/29/2017 12/17/2017 12/12/2017 09/18/2017  Does Patient Have a Medical Advance Directive? Yes Yes Yes Yes No Yes Yes  Type of Academic librarian Living will [place];Living will [place];Living will [place];Living will [place];Living will [place];Living will  Does patient want to make changes to medical advance directive? No - Patient declined - No - Patient declined No - Patient declined - No - Patient declined No - Patient declined  Copy of [place] in Chart? No - copy requested - No - copy requested No - copy requested - - No - copy requested  Would patient like information on creating a medical advance directive? - - No - Patient declined - - - No - Patient declined    Tobacco Social History   Tobacco Use  Smoking Status Former Smoker  . Packs/day: 1.00  . Years: 10.00  . Pack years: 10.00  . Types: Cigarettes, Pipe, Cigars  . Quit date: 04/18/1972  . Years since quitting: 46.7  Smokeless Tobacco Former Systems developer  . Types: Chew  . Quit date: 04/18/1972     Counseling given: Not Answered   Clinical Intake:  Pre-visit preparation completed: Yes        Diabetes: No  How often do you need to have someone help you when you read instructions, pamphlets, or other written materials from your  doctor or pharmacy?: 1 - Never  Interpreter Needed?: No     Past Medical History:  Diagnosis Date  . Atherosclerosis of abdominal aorta ([place])   . CAD (coronary artery disease)   . Cervical spondylosis 10/01/2013  . Chronic diastolic CHF (congestive heart failure) ([place])    a. 07/2016 Echo: >55%; b. 10/2016 Echo: EF 55-60%, Gr1 DD, Ao sclerosis w/o stenosis, sev dil LA; c. 08/2017 Echo: EF 60-65%, no rwma, Gr2 DD, mild AS, sev dil LA/RA.  [name] Coronary artery disease    a. 1998 s/p mini-cabg @ [facility] - LIMA->LAD;  b. 07/2016 St Echo: Inadequate HR w/ HTN response;  c.  08/2016 MV: EF 67%, no ischemia; d. 10/2016 NSTEMI/Cath: RCA 95p (4.0x26 Onyx DES), LIMA->LAD nl; e. 09/2017 Cath: LM 40/30, LAD 100ost, RI 80, LCX nl, OM2/3 nl, RCA patent stent, 59m, LIMA->LAD nl-->Med Rx.  . DDD (degenerative disc disease), cervical   . DDD (degenerative disc disease), lumbar   . Depression   . GERD (gastroesophageal reflux disease)   . Hyperlipidemia   . Hypertension   . Hypothyroidism   . PAF (paroxysmal atrial fibrillation) (H[facility])    a. s/p DCCV-->maintaining sinus on amiodarone;  b. CHA2DS2VASc = 5-->eliquis.  . Parkinson's disease ([place])    tremors  . Pleural effusion, right    a. 09/2017 s/p thoracentesis.  [name] PNA (pneumonia) 08/26/2017  . Pulmonary embolism ([place]) 2011  . Pulmonary fibrosis ([place])   . 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 Sleep apnea  wears CPAP  . Thrombocytopenia ([place])    Past Surgical History:  Procedure Laterality Date  . BACK SURGERY  1960  . CARDIAC CATHETERIZATION    . CHOLECYSTECTOMY  2010  . COLONOSCOPY WITH PROPOFOL N/A 06/07/2018   Procedure: COLONOSCOPY WITH PROPOFOL;  Surgeon: Lollie Sails, MD;  Location: [facility] ENDOSCOPY;  Service: Endoscopy;  Laterality: N/A;  . CORONARY ARTERY BYPASS GRAFT  01/07/1997  . CORONARY STENT INTERVENTION N/A 10/31/2016   Procedure: Coronary Stent Intervention;  Surgeon: Wellington Hampshire, MD;  Location: [place] CV LAB;  Service:  Cardiovascular;  Laterality: N/A;  . ELECTROPHYSIOLOGIC STUDY N/A 07/14/2015   Procedure: CARDIOVERSION;  Surgeon: Yolonda Kida, MD;  Location: [facility] ORS;  Service: Cardiovascular;  Laterality: N/A;  . ELECTROPHYSIOLOGIC STUDY N/A 10/12/2015   Procedure: CARDIOVERSION;  Surgeon: Minna Merritts, MD;  Location: [facility] ORS;  Service: Cardiovascular;  Laterality: N/A;  . LEFT HEART CATH AND CORONARY ANGIOGRAPHY N/A 10/31/2016   Procedure: Left Heart Cath and Coronary Angiography;  Surgeon: Wellington Hampshire, MD;  Location: [place] CV LAB;  Service: Cardiovascular;  Laterality: N/A;  . OTHER SURGICAL HISTORY  1998   Bypass  . RIGHT/LEFT HEART CATH AND CORONARY ANGIOGRAPHY N/A 09/18/2017   Procedure: RIGHT/LEFT HEART CATH AND CORONARY ANGIOGRAPHY;  Surgeon: Wellington Hampshire, MD;  Location: [place] CV LAB;  Service: Cardiovascular;  Laterality: N/A;   Family History  Problem Relation Age of Onset  . Alcohol abuse Father    Social History   Socioeconomic History  . Marital status: Married    Spouse name: [name]  . Number of children: 1  . Years of education: 59  . Highest education level: Not on file  Occupational History  . Occupation: Retired    Comment: Designer, television/film set  Social Needs  . Financial resource strain: Not hard at all  . Food insecurity    Worry: Never true    Inability: Never true  . Transportation needs    Medical: No    Non-medical: No  Tobacco Use  . Smoking status: Former Smoker    Packs/day: 1.00    Years: 10.00    Pack years: 10.00    Types: Cigarettes, Pipe, Cigars    Quit date: 04/18/1972    Years since quitting: 46.7  . Smokeless tobacco: Former Systems developer    Types: [place] date: 04/18/1972  Substance and Sexual Activity  . Alcohol use: Yes    Alcohol/week: 4.0 standard drinks    Types: 2 Cans of beer, 2 Shots of liquor per week    Comment: per 2 weeks   . Drug use: No  . Sexual activity: Yes  Lifestyle  . Physical activity    Days  per week: 0 days    Minutes per session: Not on file  . Stress: Not on file  Relationships  . Social Herbalist on phone: Not on file    Gets together: Not on file    Attends religious service: Not on file    Active member of club or organization: Not on file    Attends meetings of clubs or organizations: Not on file    Relationship status: Not on file  Other Topics Concern  . Not on file  Social History Narrative   Lives w/ wife   Caffeine use: none   Right-handed    Outpatient Encounter Medications as of 12/31/2018  Medication Sig  . albuterol (PROVENTIL) (2.5 MG/3ML) 0.083% nebulizer solution  USE ONE AMPULE (3ML) IN NEBULIZER EVERY FOUR HOURS AS NEEDED FOR WHEEZING OR SHORTNESS OF BREATH  . carbidopa-levodopa (SINEMET IR) 25-250 MG tablet TAKE TWO TABLETS 3 TIMES DAILY (Patient taking differently: 2 tablets 3 (three) times daily. )  . carvedilol (COREG) 3.125 MG tablet TAKE TWO TABLETS TWICE DAILY WITH MEALS  . cyclobenzaprine (FLEXERIL) 5 MG tablet Take 5 mg by mouth daily as needed for muscle spasms.   [name] ELIQUIS 5 MG TABS tablet TAKE ONE TABLET BY MOUTH TWICE DAILY  . entacapone (COMTAN) 200 MG tablet Take 1 tablet (200 mg total) by mouth 3 (three) times daily.  [name] escitalopram (LEXAPRO) 20 MG tablet Take 1 tablet (20 mg total) by mouth daily. (Patient taking differently: Take 10 mg by mouth daily. )  . esomeprazole (NEXIUM) 20 MG capsule Take 48 mg by mouth daily at 12 noon.   . fluticasone (FLONASE) 50 MCG/ACT nasal spray TAKE 2 PUFFS IN EACH NOSTRIL DAILY  . furosemide (LASIX) 20 MG tablet TAKE TWO TABLETS BY MOUTH TWICE DAILY  . HYDROcodone-acetaminophen (NORCO/VICODIN) 5-325 MG tablet Take by mouth.  . isosorbide mononitrate (IMDUR) 30 MG 24 hr tablet TAKE 1 TABLET BY MOUTH DAILY  . ketoconazole (NIZORAL) 2 % cream   . levothyroxine (SYNTHROID, LEVOTHROID) 50 MCG tablet TAKE 1 TABLET EVERY DAY ON EMPTY STOMACHWITH A GLASS OF WATER AT LEAST 30-60 MINBEFORE BREAKFAST   . losartan (COZAAR) 25 MG tablet TAKE ONE TABLET EVERY DAY  . mirabegron ER (MYRBETRIQ) 50 MG TB24 tablet Take 1 tablet (50 mg total) by mouth daily.  . modafinil (PROVIGIL) 200 MG tablet TAKE 1 TABLET BY MOUTH TWICE DAILY  . mometasone (ELOCON) 0.1 % cream   . Polyethyl Glycol-Propyl Glycol (SYSTANE OP) Place 1 drop into both eyes 2 (two) times daily as needed (dry eyes).  . potassium chloride SA (K-DUR) 20 MEQ tablet TAKE ONE TABLET EVERY DAY  . rosuvastatin (CRESTOR) 10 MG tablet TAKE ONE TABLET EVERY DAY  . Tiotropium Bromide-Olodaterol (STIOLTO RESPIMAT) 2.5-2.5 MCG/ACT AERS Inhale 2 puffs into the lungs daily.   No facility-administered encounter medications on file as of 12/31/2018.     Activities of Daily Living In your present state of health, do you have any difficulty performing the following activities: 12/31/2018  Hearing? Y  Vision? N  Difficulty concentrating or making decisions? N  Walking or climbing stairs? Y  Comment Unsteady gait; he paces himself  Dressing or bathing? N  Doing errands, shopping? Y  Comment Social research officer, government and eating ? Y  Comment Wife prepares meals. Self feeds.  Using the Toilet? N  In the past six months, have you accidently leaked urine? N  Do you have problems with loss of bowel control? N  Managing your Medications? Y  Comment Wife manages  Managing your Finances? Y  Comment Wife manages  Housekeeping or managing your Housekeeping? Y  Comment Wife manages  Some recent data might be hidden    Patient Care Team: [name], MD as PCP - General (Family Medicine) Wellington Hampshire, MD as PCP - Cardiology (Cardiology)   Assessment:   This is a routine wellness examination for [place].  I connected with patient 12/31/18 at 12:00 PM EDT by an audio enabled telemedicine application and verified that I am speaking with the correct person using two identifiers. Patient stated full name and DOB. Patient gave permission to continue  with virtual visit. Patient's location was at home and Nurse's location was at [place] office.  Health Maintenance Due: -PNA 23 - discussed; to be completed with doctor in visit or local pharmacy.  -Shingrix- second dose due Update all pending maintenance due as appropriate.   See completed HM at the end of note.   Eye: Visual acuity not assessed. Virtual visit. Wears corrective lenses. Followed by their ophthalmologist every 12 months.   Dental: Visits every 6 months.    Hearing: Notes difficulty hearing. Followed by Dr. Richardson Landry, ENT. Hearing aids- he will consider.   Safety:  Patient feels safe at home- yes Patient does have smoke detectors at home- yes Patient does wear sunscreen or protective clothing when in direct sunlight - yes Patient does wear seat belt when in a moving vehicle - yes Patient drives- yes; limited.  Adequate lighting in walkways free from debris- yes Grab bars and handrails used as appropriate- yes Ambulates with no assistive device Cell phone on person when ambulating outside of the home- yes  Social: Alcohol intake - yes      Smoking history- former Smokers in home? none Illicit drug use? none  Depression: PHQ 2 &9 complete. See screening below. Denies irritability, anhedonia, sadness/tearfullness.  Stable.   Falls: See screening below.    Medication: Taking as directed and without issues.   Covid-19: Precautions and sickness symptoms discussed. Wears mask, social distancing, hand hygiene as appropriate.   Activities of Daily Living Patient denies needing assistance with: feeding themselves, getting from bed to chair, getting to the toilet, bathing/showering, dressing.   Assisted by wife with household chores, managing money, preparing meals and medication management.   Memory: Patient is alert. Patient denies difficulty focusing or concentrating. Correctly identified the president of the [place], season and recall. Patient likes to read,  play computer games, complete puzzles for brain stimulation.  BMI- discussed the importance of a healthy diet, water intake and the benefits of aerobic exercise.  Educational material provided.  Physical activity- active walking in and around the home.   Diet: regular; monitors sodium. Water: good intake  Advanced Directive: End of life planning; Advance aging; Advanced directives discussed.  Copy of current HCPOA/Living Will requested.    Other Providers Patient Care Team: [name], MD as PCP - General (Family Medicine) Wellington Hampshire, MD as PCP - Cardiology (Cardiology)  Exercise Activities and Dietary recommendations Current Exercise Habits: Home exercise routine, Type of exercise: walking, Intensity: Mild  Goals    . DIET - INCREASE LEAN PROTEINS     Low sodium diet Low carb diet       Fall Risk Fall Risk  12/31/2018 12/11/2018 11/09/2018 12/29/2017 12/22/2017  Falls in the past year? 0 0 0 No (No Data)  Comment - - - - No new/ recent falls, Per report of patient's wife, whom patient provided previous verbal permission to speak with   Number falls in past yr: - - - - -  Injury with Fall? - - - - -  Risk for fall due to : - - - - -  Risk for fall due to: Comment - - - - -  Follow up - Falls evaluation completed - - -   Timed Get Up and Go Performed: no, virtual visit.  Depression Screen PHQ 2/9 Scores 12/31/2018 11/09/2018 12/29/2017 11/02/2017  PHQ - 2 Score 0 0 4 6  PHQ- 9 Score - - 13 21    Cognitive Function     6CIT Screen 12/31/2018 12/29/2017  What Year? 0 points 0 points  What month? 0 points  0 points  What time? 0 points 0 points  Count back from 20 0 points 0 points  Months in reverse 0 points 0 points  Repeat phrase 0 points 0 points  Total Score 0 0    Immunization History  Administered Date(s) Administered  . Fluad Quad(high Dose 65+) 12/11/2018  . Influenza Split 03/18/2012  . Influenza, High Dose Seasonal PF 01/13/2016, 01/26/2017,  12/29/2017  . Influenza-Unspecified 01/06/2016, 01/26/2017  . Pneumococcal Conjugate-13 05/10/2016  . Td 02/08/2017  . Zoster 04/19/2011  . Zoster Recombinat (Shingrix) 12/07/2017   Screening Tests Health Maintenance  Topic Date Due  . PNA vac Low Risk Adult (2 of 2 - PPSV23) 05/10/2017  . TETANUS/TDAP  02/09/2027  . INFLUENZA VACCINE  Completed       Plan:    Keep all routine maintenance appointments.   Follow up  03/19/20 @ 1:45  Medicare Attestation I have personally reviewed: The patient's medical and social history Their use of alcohol, tobacco or illicit drugs Their current medications and supplements The patient's functional ability including ADLs,fall risks, home safety risks, cognitive, and hearing and visual impairment Diet and physical activities Evidence for depression   In addition, I have reviewed and discussed with patient certain preventive protocols, quality metrics, and best practice recommendations. A written personalized care plan for preventive services as well as general preventive health recommendations were provided to patient via mail.     Varney Biles, LPN  [id number]

## 2018-12-31 NOTE — Patient Instructions (Addendum)
  Andre [name] , Thank you for taking time to come for your Medicare Wellness Visit. I appreciate your ongoing commitment to your health goals. Please review the following plan we discussed and let me know if I can assist you in the future.   These are the goals we discussed: Goals    . DIET - INCREASE LEAN PROTEINS     Low sodium diet Low carb diet       This is a list of the screening recommended for you and due dates:  Health Maintenance  Topic Date Due  . Pneumonia vaccines (2 of 2 - PPSV23) 05/10/2017  . Tetanus Vaccine  02/09/2027  . Flu Shot  Completed

## 2019-01-01 ENCOUNTER — Other Ambulatory Visit: Payer: Self-pay

## 2019-01-01 MED ORDER — ENTACAPONE 200 MG PO TABS: 200.0000 mg | ORAL_TABLET | Freq: Three times a day (TID) | ORAL | 2 refills | Status: DC

## 2019-01-02 DIAGNOSIS — D229 Melanocytic nevi, unspecified: Secondary | ICD-10-CM | POA: Diagnosis not present

## 2019-01-02 DIAGNOSIS — Z1283 Encounter for screening for malignant neoplasm of skin: Secondary | ICD-10-CM | POA: Diagnosis not present

## 2019-01-02 DIAGNOSIS — L570 Actinic keratosis: Secondary | ICD-10-CM | POA: Diagnosis not present

## 2019-01-02 DIAGNOSIS — L820 Inflamed seborrheic keratosis: Secondary | ICD-10-CM | POA: Diagnosis not present

## 2019-01-02 DIAGNOSIS — D225 Melanocytic nevi of trunk: Secondary | ICD-10-CM | POA: Diagnosis not present

## 2019-01-02 DIAGNOSIS — D2230 Melanocytic nevi of unspecified part of face: Secondary | ICD-10-CM | POA: Diagnosis not present

## 2019-01-05 DIAGNOSIS — G4733 Obstructive sleep apnea (adult) (pediatric): Secondary | ICD-10-CM | POA: Diagnosis not present

## 2019-01-05 DIAGNOSIS — J45909 Unspecified asthma, uncomplicated: Secondary | ICD-10-CM | POA: Diagnosis not present

## 2019-01-05 DIAGNOSIS — J441 Chronic obstructive pulmonary disease with (acute) exacerbation: Secondary | ICD-10-CM | POA: Diagnosis not present

## 2019-01-05 DIAGNOSIS — J449 Chronic obstructive pulmonary disease, unspecified: Secondary | ICD-10-CM | POA: Diagnosis not present

## 2019-01-31 ENCOUNTER — Telehealth: Payer: Self-pay | Admitting: Pulmonary Disease

## 2019-01-31 NOTE — Telephone Encounter (Signed)
Called and spoke to patient. Relayed message to patient and wife from [place], NP.  Scheduled patient for a MyChart Video Visit for tomorrow.  Patient stated he wanted to see Beth since he had seen her recently.  Nothing further needed at this time.

## 2019-01-31 NOTE — Telephone Encounter (Signed)
ATC patient unable to reach LM to call back office (x1)  

## 2019-01-31 NOTE — Telephone Encounter (Signed)
[name] called back.  Primary Pulmonologist: Dr. Vaughan Browner Last office visit and with whom: 09/18/18 BW What do we see them for (pulmonary problems): COPD Last OV assessment/plan:  "Assessment and Plan:  COPD exacerbation/dyspnea - Improved after completing doxycycline course and starting Stiolto  COPD - Continue Stiolto - take 2 puffs in the morning (will send in prescription) - Albuterol nebulizer ever 6 hours as needed for breakthrough shortness of breath/wheezing"  Was appointment offered to patient (explain)?  televisit or in office?   Reason for call: patient states "his breathing is not good." He states he is wheezing a lot, taking nebulizer medication every 4 hours, denies fever, body aches, n/v/d . Patient also has CHF said weight has gone up 2lb in 2 days. Not a lot of leg swelling wife states only a little, when checking pulse ox readings are 97-98%, but patient states he feels like he is catching a cold.   TP please advise

## 2019-01-31 NOTE — Telephone Encounter (Signed)
Needs ov -can make video visit for tomorrow with APP or [place]   Use Mucinex DM Twice daily  As needed   Continue on Stiolto .  Use albuterol nebs As needed  Wheezing every 4 hr as needed.    Please contact office for sooner follow up if symptoms do not improve or worsen or seek emergency care

## 2019-02-01 ENCOUNTER — Ambulatory Visit (INDEPENDENT_AMBULATORY_CARE_PROVIDER_SITE_OTHER): Payer: PPO | Admitting: Primary Care

## 2019-02-01 ENCOUNTER — Encounter: Payer: Self-pay | Admitting: Primary Care

## 2019-02-01 DIAGNOSIS — J441 Chronic obstructive pulmonary disease with (acute) exacerbation: Secondary | ICD-10-CM | POA: Diagnosis not present

## 2019-02-01 MED ORDER — PREDNISONE 10 MG PO TABS: ORAL_TABLET | ORAL | 0 refills | Status: DC

## 2019-02-01 MED ORDER — DOXYCYCLINE HYCLATE 100 MG PO TABS: 100.0000 mg | ORAL_TABLET | Freq: Two times a day (BID) | ORAL | 0 refills | Status: DC

## 2019-02-01 NOTE — Progress Notes (Signed)
Virtual Visit via Telephone Note  I connected with Andre [name] on 02/01/19 at  1:30 PM EDT by telephone and verified that I am speaking with the correct person using two identifiers.  Location: Patient: Home  Provider: Office   I discussed the limitations, risks, security and privacy concerns of performing an evaluation and management service by telephone and the availability of in person appointments. I also discussed with the patient that there may be a patient responsible charge related to this service. The patient expressed understanding and agreed to proceed.   History of Present Illness: 83 year old male, former smoker quit in 1974. PMH significant for ILD, moderate COPD/emphysema, OSA on CPAP, HF, CAD, HTN, AF, GERD, Barrett's esophagus. Patient of Dr. Vaughan Browner, last seen on 05/23/18. CT Chest showed basilar fibrosis, pleural calcifications consistent with asbestos exposure. Fibrotic changes have remained stable since 2013. Anti-fibrotic treatment not recommended at this time. He was started on Anoro during last visit. Advised weight loss and pulmonary rehab.   Previous [facility] pulmonary encounters: 09/04/2018 Patient presents today for acute visit with complaints shortness of breath x2 months. No improvement with Anoro or prn Albuterol.  He gets short of breath with minimal activity. Associated leg swelling. He weighs himself daily and has gained 1lb a month over the last 5 months. He has an apt with cardiology in 2 weeks. On blood thinner for previous hx PE after surgery. Denies fever, flu-like illness, cough or wheezing. CXR on 5/19 showed pulmonary fibrosis with possible superimposed infection mid-lung bilaterally. BNP was elevated at 168. Instructed to take additional lasix tablet x 5 days and sent in prescription for doxycycline. Anoro changed to [employer]. Patient re-referred to pulmonary rehab.   09/18/2018 Patient called today for 2 week follow-up. He is feeling much better. He started [employer] and states that almost immediately he felt better. No cough. Weight varies 2-3 lbs a day and wife notices leg swelling. Taking lasix 40mg  twice daily. They plan on discussing diuretics with cardiology.    02/01/2019 Patient contacted today for televisit, reports wheezing and cough x 2 weeks. Accompanied by his wife on phone call. Cough is productive with pale yellow sputum. Using [employer] as prescribed daily, feels it continues to work well for him. Started using his Albuterol nebulizer 2 times a day for the last week. Still taking his diuretic twice daily. Weight is 270, up 1-3lb from last visit. No leg swelling. Denies fever, chills, chest pain, sob or chest tightness.   Observations/Objective:  - No significant shortness of breath, wheezing or cough noted  Assessment and Plan:  COPD exacerbation - Symptoms consistent with COPD exacerbation - RX doxycycline 1 tab twice daily x 7 days; 20mg  prednisone x 5 days - Encourage mucinex twice daily for congestion - Continue Stiolto 2 puffs once daily - Use albuterol nebulizer q6 hours for shortness of breath/wheezing   Follow Up Instructions:  - If symptoms do not improve in 1 week or worsen in any way    I discussed the assessment and treatment plan with the patient. The patient was provided an opportunity to ask questions and all were answered. The patient agreed with the plan and demonstrated an understanding of the instructions.   The patient was advised to call back or seek an in-person evaluation if the symptoms worsen or if the condition fails to improve as anticipated.  I provided 18 minutes of non-face-to-face time during this encounter.   Martyn Ehrich, NP

## 2019-02-01 NOTE — Patient Instructions (Signed)
  COPD exacerbation: - Symptoms consistent with COPD exacerbation - RX doxycycline 1 tab twice daily x 7 days - RX 20mg  prednisone x 5 days (low dose steriod) - Encourage mucinex twice daily for congestion - Continue Stiolto 2 puffs once daily - Use albuterol nebulizer q6 hours for shortness of breath/wheezing   Follow-up: If symptoms do not improve in 1 week or worsen in any way

## 2019-02-04 DIAGNOSIS — J449 Chronic obstructive pulmonary disease, unspecified: Secondary | ICD-10-CM | POA: Diagnosis not present

## 2019-02-04 DIAGNOSIS — G4733 Obstructive sleep apnea (adult) (pediatric): Secondary | ICD-10-CM | POA: Diagnosis not present

## 2019-02-04 DIAGNOSIS — J45909 Unspecified asthma, uncomplicated: Secondary | ICD-10-CM | POA: Diagnosis not present

## 2019-02-04 DIAGNOSIS — J441 Chronic obstructive pulmonary disease with (acute) exacerbation: Secondary | ICD-10-CM | POA: Diagnosis not present

## 2019-02-06 ENCOUNTER — Other Ambulatory Visit: Payer: Self-pay | Admitting: Cardiovascular Disease

## 2019-02-06 ENCOUNTER — Other Ambulatory Visit: Payer: Self-pay | Admitting: Family Medicine

## 2019-02-11 ENCOUNTER — Other Ambulatory Visit: Payer: Self-pay | Admitting: Cardiovascular Disease

## 2019-02-18 ENCOUNTER — Telehealth: Payer: Self-pay | Admitting: Neurology

## 2019-02-20 NOTE — Telephone Encounter (Addendum)
[place] drug registry has been verified. Last refill was 01/21/2019 # 60 for a 30 day supply. Pt has been compliant with f/u's. Dr. Jaynee Eagles, could you sign for Dr. Jannifer Franklin while he is out of the office?

## 2019-02-20 NOTE — Telephone Encounter (Signed)
Done. thanks

## 2019-02-22 ENCOUNTER — Other Ambulatory Visit: Payer: Self-pay | Admitting: Cardiovascular Disease

## 2019-02-22 NOTE — Telephone Encounter (Signed)
83 years old  122kg Last OV 09/21/2018 Scr 0.97 on 12/17/2018

## 2019-02-22 NOTE — Telephone Encounter (Signed)
Refill request for Eliquis

## 2019-03-07 ENCOUNTER — Other Ambulatory Visit: Payer: Self-pay

## 2019-03-07 ENCOUNTER — Telehealth: Payer: Self-pay | Admitting: Cardiovascular Disease

## 2019-03-07 ENCOUNTER — Telehealth (INDEPENDENT_AMBULATORY_CARE_PROVIDER_SITE_OTHER): Payer: PPO | Admitting: Cardiovascular Disease

## 2019-03-07 ENCOUNTER — Encounter: Payer: Self-pay | Admitting: Cardiovascular Disease

## 2019-03-07 VITALS — BP 149/65 | HR 61 | Ht 67.5 in | Wt 269.0 lb

## 2019-03-07 DIAGNOSIS — I480 Paroxysmal atrial fibrillation: Secondary | ICD-10-CM

## 2019-03-07 DIAGNOSIS — E785 Hyperlipidemia, unspecified: Secondary | ICD-10-CM

## 2019-03-07 DIAGNOSIS — I10 Essential (primary) hypertension: Secondary | ICD-10-CM

## 2019-03-07 DIAGNOSIS — I2510 Atherosclerotic heart disease of native coronary artery without angina pectoris: Secondary | ICD-10-CM

## 2019-03-07 MED ORDER — LOSARTAN POTASSIUM 50 MG PO TABS: 50.0000 mg | ORAL_TABLET | Freq: Every day | ORAL | 2 refills | Status: DC

## 2019-03-07 NOTE — Progress Notes (Signed)
Virtual Visit via Telephone Note   This visit type was conducted due to national recommendations for restrictions regarding the COVID-19 Pandemic (e.g. social distancing) in an effort to limit this patient's exposure and mitigate transmission in our community.  Due to his co-morbid illnesses, this patient is at least at moderate risk for complications without adequate follow up.  This format is felt to be most appropriate for this patient at this time.  The patient did not have access to video technology/had technical difficulties with video requiring transitioning to audio format only (telephone).  All issues noted in this document were discussed and addressed.  No physical exam could be performed with this format.  Please refer to the patient's chart for his  consent to telehealth for [facility].   Date:  03/07/2019   ID:  Andre [name], DOB Mar 17, 1936, MRN [id number]  Patient Location: Home Provider Location: Office  PCP:  [name], MD  Cardiologist:  [name], MD  Electrophysiologist:  None   Evaluation Performed:  Follow-Up Visit  Chief Complaint: Chronic shortness of breath  History of Present Illness:    Andre [name] is a 83 y.o. male was reached via phone today for follow-up regarding coronary artery disease.   He has known history of coronary artery disease status post 1 vessel CABG with LIMA to LAD in 1998, hypertension, hyperlipidemia, chronic diastolic heart aure, paroxysmal atrial fibrillation and morbid obesity. He also has history of pulmonary embolism and Parkinson's. He was hospitalized in July of 2018 with a small non-ST elevation myocardial infarction. Echocardiogram showed normal LV systolic function with severely dilated left atrium. Cardiac catheterization showed occluded native LAD with patent LIMA, severe proximal RCA stenosis with heavy thrombus and moderate distal left main stenosis with significant ostial stenosis in the ramus branch. EF was  50-55% with mildly elevated left ventricular end-diastolic pressure. I performed successful angioplasty and drug-eluting stent placement to the right coronary artery which was complicated by embolization into a large RV branch at the lesion site. This was treated with balloon angioplasty.  A right and left cardiac catheterization was done in June of 2019 due to exertional dyspnea which showed  patent LIMA to LAD and patent RCA stent.  There was significant ostial disease in ramus branch as well as first diagonal.  Both of these were unchanged from most recent cardiac catheterization.  Right heart catheterization showed only mildly elevated filling pressures, minimal pulmonary hypertension and normal cardiac output.  Based on that, I felt that his symptoms were mostly noncardiac. Shortness of breath was felt to be multifactorial due to chronic diastolic heart failure, lung disease, physical deconditioning and possible allergies.    Pulmonary function testing was suggestive of restrictive physiology and CT scan showed early pulmonary fibrosis and COPD.  Due to these changes, we elected to discontinue amiodarone.    He has been doing reasonably well with stable exertional dyspnea no chest pain.  No significant leg edema.  He continues to follow with pulmonary.   The patient does not have symptoms concerning for COVID-19 infection (fever, chills, cough, or new shortness of breath).    Past Medical History:  Diagnosis Date   Atherosclerosis of abdominal aorta (HCC)    CAD (coronary artery disease)    Cervical spondylosis 10/01/2013   Chronic diastolic CHF (congestive heart failure) ([place])    a. 07/2016 Echo: >55%; b. 10/2016 Echo: EF 55-60%, Gr1 DD, Ao sclerosis w/o stenosis, sev dil LA; c. 08/2017 Echo: EF 60-65%,  no rwma, Gr2 DD, mild AS, sev dil LA/RA.   Coronary artery disease    a. 1998 s/p mini-cabg @ [facility] - LIMA->LAD;  b. 07/2016 St Echo: Inadequate HR w/ HTN response;  c.  08/2016 MV: EF 67%,  no ischemia; d. 10/2016 NSTEMI/Cath: RCA 95p (4.0x26 Onyx DES), LIMA->LAD nl; e. 09/2017 Cath: LM 40/30, LAD 100ost, RI 80, LCX nl, OM2/3 nl, RCA patent stent, 15m, LIMA->LAD nl-->Med Rx.   DDD (degenerative disc disease), cervical    DDD (degenerative disc disease), lumbar    Depression    GERD (gastroesophageal reflux disease)    Hyperlipidemia    Hypertension    Hypothyroidism    PAF (paroxysmal atrial fibrillation) ([place])    a. s/p DCCV-->maintaining sinus on amiodarone;  b. CHA2DS2VASc = 5-->eliquis.   Parkinson's disease ([place])    tremors   Pleural effusion, right    a. 09/2017 s/p thoracentesis.   PNA (pneumonia) 08/26/2017   Pulmonary embolism ([place]) 2011   Pulmonary fibrosis ([place])    Secondary erythrocytosis 01/28/2015   Sleep apnea    wears CPAP   Thrombocytopenia [facility])    Past Surgical History:  Procedure Laterality Date   BACK SURGERY  1960   CARDIAC CATHETERIZATION     CHOLECYSTECTOMY  2010   COLONOSCOPY WITH PROPOFOL N/A 06/07/2018   Procedure: COLONOSCOPY WITH PROPOFOL;  Surgeon: Lollie Sails, MD;  Location: [facility] ENDOSCOPY;  Service: Endoscopy;  Laterality: N/A;   CORONARY ARTERY BYPASS GRAFT  01/07/1997   CORONARY STENT INTERVENTION N/A 10/31/2016   Procedure: Coronary Stent Intervention;  Surgeon: Wellington Hampshire, MD;  Location: [place] CV LAB;  Service: Cardiovascular;  Laterality: N/A;   ELECTROPHYSIOLOGIC STUDY N/A 07/14/2015   Procedure: CARDIOVERSION;  Surgeon: Yolonda Kida, MD;  Location: [facility] ORS;  Service: Cardiovascular;  Laterality: N/A;   ELECTROPHYSIOLOGIC STUDY N/A 10/12/2015   Procedure: CARDIOVERSION;  Surgeon: Minna Merritts, MD;  Location: [facility] ORS;  Service: Cardiovascular;  Laterality: N/A;   LEFT HEART CATH AND CORONARY ANGIOGRAPHY N/A 10/31/2016   Procedure: Left Heart Cath and Coronary Angiography;  Surgeon: Wellington Hampshire, MD;  Location: [place] CV LAB;  Service: Cardiovascular;  Laterality: N/A;     OTHER SURGICAL HISTORY  1998   Bypass   RIGHT/LEFT HEART CATH AND CORONARY ANGIOGRAPHY N/A 09/18/2017   Procedure: RIGHT/LEFT HEART CATH AND CORONARY ANGIOGRAPHY;  Surgeon: Wellington Hampshire, MD;  Location: [place] CV LAB;  Service: Cardiovascular;  Laterality: N/A;     Current Meds  Medication Sig   albuterol (PROVENTIL) (2.5 MG/3ML) 0.083% nebulizer solution USE ONE AMPULE (3ML) IN NEBULIZER EVERY FOUR HOURS AS NEEDED FOR WHEEZING OR SHORTNESS OF BREATH   carbidopa-levodopa (SINEMET IR) 25-250 MG tablet TAKE TWO TABLETS 3 TIMES DAILY (Patient taking differently: 2 tablets 3 (three) times daily. )   carvedilol (COREG) 3.125 MG tablet TAKE TWO TABLETS TWICE A DAY WITH MEALS   cholecalciferol (VITAMIN D3) 25 MCG (1000 UT) tablet Take 2,000 Units by mouth daily.   ELIQUIS 5 MG TABS tablet TAKE ONE TABLET BY MOUTH TWICE DAILY   entacapone (COMTAN) 200 MG tablet Take 1 tablet (200 mg total) by mouth 3 (three) times daily.   escitalopram (LEXAPRO) 20 MG tablet TAKE ONE TABLET EVERY DAY (Patient taking differently: Take 10 mg by mouth daily. )   esomeprazole (NEXIUM) 20 MG capsule Take 48 mg by mouth daily at 12 noon.    fluticasone (FLONASE) 50 MCG/ACT nasal spray TAKE 2 PUFFS IN EACH NOSTRIL  DAILY   furosemide (LASIX) 20 MG tablet TAKE TWO TABLETS TWICE DAILY   HYDROcodone-acetaminophen (NORCO/VICODIN) 5-325 MG tablet Take by mouth.   isosorbide mononitrate (IMDUR) 30 MG 24 hr tablet TAKE ONE TABLET BY MOUTH EVERY DAY   ketoconazole (NIZORAL) 2 % cream    levothyroxine (SYNTHROID, LEVOTHROID) 50 MCG tablet TAKE 1 TABLET EVERY DAY ON EMPTY STOMACHWITH A GLASS OF WATER AT LEAST 30-60 MINBEFORE BREAKFAST   losartan (COZAAR) 25 MG tablet TAKE ONE TABLET EVERY DAY   mirabegron ER (MYRBETRIQ) 50 MG TB24 tablet Take 1 tablet (50 mg total) by mouth daily.   modafinil (PROVIGIL) 200 MG tablet TAKE 1 TABLET BY MOUTH TWICE DAILY   mometasone (ELOCON) 0.1 % cream    Polyethyl  Glycol-Propyl Glycol (SYSTANE OP) Place 1 drop into both eyes 2 (two) times daily as needed (dry eyes).   potassium chloride SA (KLOR-CON) 20 MEQ tablet TAKE ONE TABLET EVERY DAY   rosuvastatin (CRESTOR) 10 MG tablet TAKE 1 TABLET BY MOUTH DAILY   Tiotropium Bromide-Olodaterol (STIOLTO RESPIMAT) 2.5-2.5 MCG/ACT AERS Inhale 2 puffs into the lungs daily.   Zinc 50 MG TABS Take 50 mg by mouth daily.     Allergies:   Pravastatin and Prednisone   Social History   Tobacco Use   Smoking status: Former Smoker    Packs/day: 1.00    Years: 10.00    Pack years: 10.00    Types: Cigarettes, Pipe, Cigars    Quit date: 04/18/1972    Years since quitting: 46.9   Smokeless tobacco: Former Systems developer    Types: [place] date: 04/18/1972  Substance Use Topics   Alcohol use: Yes    Alcohol/week: 4.0 standard drinks    Types: 2 Cans of beer, 2 Shots of liquor per week    Comment: per 2 weeks    Drug use: No     Family Hx: The patient's family history includes Alcohol abuse in his father.  ROS:   Please see the history of present illness.     All other systems reviewed and are negative.   Prior CV studies:   The following studies were reviewed today:    Labs/Other Tests and Data Reviewed:    EKG:  No ECG reviewed.  Recent Labs: 09/04/2018: Pro B Natriuretic peptide (BNP) 168.0 12/17/2018: ALT 10; BUN 19; Creatinine, Ser 0.97; Hemoglobin 14.9; Platelets 137; Potassium 4.1; Sodium 138   Recent Lipid Panel Lab Results  Component Value Date/Time   CHOL 146 08/27/2017 04:19 AM   TRIG 134 08/27/2017 04:19 AM   HDL 43 08/27/2017 04:19 AM   CHOLHDL 3.4 08/27/2017 04:19 AM   LDLCALC 76 08/27/2017 04:19 AM    Wt Readings from Last 3 Encounters:  03/07/19 269 lb (122 kg)  12/17/18 269 lb (122 kg)  12/11/18 271 lb (122.9 kg)     Objective:    Vital Signs:  BP (!) 149/65    Pulse 61    Ht 5' 7.5" (1.715 m)    Wt 269 lb (122 kg)    BMI 41.51 kg/m    VITAL SIGNS:   reviewed  ASSESSMENT & PLAN:    1.coronary artery disease involving native coronary arteries without angina: He is doing well overall.  I recommend continuing medical therapy.    2.  Paroxysmal atrial fibrillation: Maintaining in sinus rhythm.  Amiodarone was discontinued due to concerns about possible lung toxicity.  Continue treatment with small dose carvedilol and anticoagulation with Eliquis.  3.  Essential hypertension: Blood pressure has been elevated.  I increased losartan to 50 mg daily.  4. Hyperlipidemia: He had myalgia with atorvastatin but he is tolerating rosuvastatin.  Most recent LDL was 76.  5.  Chronic diastolic heart failure: He appears to be euvolemic on current dose of furosemide 40 mg twice daily.     COVID-19 Education: The signs and symptoms of COVID-19 were discussed with the patient and how to seek care for testing (follow up with PCP or arrange E-visit).  The importance of social distancing was discussed today.  Time:   Today, I have spent 12 minutes with the patient with telehealth technology discussing the above problems.     Medication Adjustments/Labs and Tests Ordered: Current medicines are reviewed at length with the patient today.  Concerns regarding medicines are outlined above.   Tests Ordered: No orders of the defined types were placed in this encounter.   Medication Changes: No orders of the defined types were placed in this encounter.   Disposition:  Follow up in 6 months  Signed, [name], MD  03/07/2019 9:22 AM    [place]

## 2019-03-07 NOTE — Telephone Encounter (Signed)
Patient wants to know if it is safe due to covid to go to an indoor shooting range for activity.

## 2019-03-07 NOTE — Telephone Encounter (Signed)
Spoke with the patient and his wife. Advised the patient that I could not advised if it would be "safe" for the patient to attend an indoor gun range. Advised the patient to adhere to the CDC guidelines. - Avoid large crowds/gatherings - Practice social distancing of atleast 6 feet - Wear a mask or face covering - Practice good hand hygiene (washing or using hand sanitizer frequently)   Advised the patient that there is a rise locally in [place] cases and it is always safer at home.   Also to f/u on today's telehealth visit with Dr. Fletcher Anon. Advised the patent that the Rx for the increase Losartan 50mg  qd has been sent to his pharmacy.   Patient and his wife verbalized understanding and voiced appreciation for the information.

## 2019-03-07 NOTE — Patient Instructions (Signed)
Medication Instructions:  Your physician has recommended you make the following change in your medication:   INCREASE Losartan to 50 mg daily. An Rx has been sent to your pharmacy.  *If you need a refill on your cardiac medications before your next appointment, please call your pharmacy*  Lab Work: None ordered If you have labs (blood work) drawn today and your tests are completely normal, you will receive your results only by: [name] MyChart Message (if you have MyChart) OR . A paper copy in the mail If you have any lab test that is abnormal or we need to change your treatment, we will call you to review the results.  Testing/Procedures: None ordered  Follow-Up: At [facility], you and your health needs are our priority.  As part of our continuing mission to provide you with exceptional heart care, we have created designated Provider Care Teams.  These Care Teams include your primary Cardiologist (physician) and Advanced Practice Providers (APPs -  Physician Assistants and Nurse Practitioners) who all work together to provide you with the care you need, when you need it.  Your next appointment:   6 months  The format for your next appointment:   In Person  Provider:    You may see [name], MD or one of the following Advanced Practice Providers on your designated Care Team:    Murray Hodgkins, NP  Christell Faith, PA-C  Marrianne Mood, PA-C   Other Instructions N/A

## 2019-03-11 ENCOUNTER — Other Ambulatory Visit: Payer: Self-pay | Admitting: Cardiovascular Disease

## 2019-03-12 ENCOUNTER — Other Ambulatory Visit: Payer: Self-pay | Admitting: Cardiovascular Disease

## 2019-03-15 ENCOUNTER — Telehealth: Payer: Self-pay | Admitting: Primary Care

## 2019-03-15 ENCOUNTER — Telehealth: Payer: Self-pay | Admitting: Physician Assistant

## 2019-03-15 ENCOUNTER — Ambulatory Visit: Payer: PPO | Admitting: Family Medicine

## 2019-03-15 MED ORDER — PREDNISONE 10 MG PO TABS: ORAL_TABLET | ORAL | 0 refills | Status: DC

## 2019-03-15 NOTE — Telephone Encounter (Signed)
Spoke with patient and his wife Andre [name]. He has been scheduled to see Dr. Vaughan Browner on 12/7 at 215. His wife will need to be with him during this appt and this was the best day due to her having surgery on 12/9.   Nothing further needed at time of call.

## 2019-03-15 NOTE — Telephone Encounter (Signed)
Reports that he's is short of breath and fatigue. Want to sleep a lot. Breathing is the same, this has been on going for awhile. Slight wheeze. Wearing CPAP at night. Denies cough or fever. Using stiolto and nebulizer twice a day. He taking 40mg  twice. Wife is concerned he may need more. Gained a little weight and a little leg swelling. O2 96% room air ; HR 56.  Plan lasix 20mg  daily x 3-5 days.  Prednisone 20mg  x 5 days.   Needs visit with Dr. Vaughan Browner in 1-4 weeks.

## 2019-03-15 NOTE — Telephone Encounter (Signed)
Wife called stating her husband has fatigue and sleeping constantly. He is SOB. They called pulmonology and they recommended increasing lasix to 100 mg total (he takes 40 mg BID at baseline). They also prescribed prednisone.   Weight today is 269.6 today from 272.2 yesterday without extra lasix. Sounds like his dry weight is around 269-270. I advised to take extra lasix for two days to see if this resolved his symptoms. She states that they told Dr. Fletcher Anon he was doing worse during their virtual visit on 03/07/19. She would like a callback from Dr. Fletcher Anon. I advised her to try the extra lasix for 2 days and then let us know how he is doing.

## 2019-03-18 NOTE — Telephone Encounter (Signed)
Please call and get an update from him about his condition.  If he still feeling poorly, he might need to be seen in the office.

## 2019-03-19 ENCOUNTER — Other Ambulatory Visit: Payer: Self-pay | Admitting: Pulmonary Disease

## 2019-03-19 NOTE — Telephone Encounter (Signed)
Spoke with the patient's wife. Pt wife sts tha she thinks the patient is doing a better. His weight has been stable and patient denies swelling. Pt wife sts that all the patient wants to do is sit aroung. Pt wife sts that she is out running errands, she is on her way home and rqst a call back while the patient is available to discuss his symptoms.   Advised the pt wife that I will call back in the next 10-15 minutes.

## 2019-03-19 NOTE — Telephone Encounter (Signed)
Spoke with the patient. Patient sts that he does feel better. Pulmonology started him on prednisone and increased his Lasix for a couple of days. Patient denies chest pain, palpitations, swelling.  Patient sts that his weight is stable and his BP and HR readings have been good. The patient's main complaint is fatigue and DOE. Patient sts that he thinks he is doing ok from a cardiac standpoint , he feels as though his symptoms are related to his lungs. He would rather not risk the exposure by coming to our office for an appt.  Patient is scheduled with his pcp on 03/20/19 and pulmonology on 03/25/19. Advised the patient to keep his scheduled appts.  Advised the patient to continue his daily weights and monitoring of his symptoms. Advised the patient to contact the office if symptoms worsen.  Advised the patient that I will fwd an update to Dr. Fletcher Anon and call back if he has additional recommendations. Patient is agreeable with the plan and voiced appreciation for the call.

## 2019-03-19 NOTE — Telephone Encounter (Signed)
Called the patient to get an update on his symptoms. [email or web address].

## 2019-03-20 ENCOUNTER — Telehealth: Payer: Self-pay

## 2019-03-20 ENCOUNTER — Encounter: Payer: Self-pay | Admitting: Family Medicine

## 2019-03-20 ENCOUNTER — Ambulatory Visit (INDEPENDENT_AMBULATORY_CARE_PROVIDER_SITE_OTHER): Payer: PPO | Admitting: Family Medicine

## 2019-03-20 ENCOUNTER — Telehealth: Payer: Self-pay | Admitting: Primary Care

## 2019-03-20 DIAGNOSIS — F32A Depression, unspecified: Secondary | ICD-10-CM

## 2019-03-20 DIAGNOSIS — G4733 Obstructive sleep apnea (adult) (pediatric): Secondary | ICD-10-CM

## 2019-03-20 DIAGNOSIS — F329 Major depressive disorder, single episode, unspecified: Secondary | ICD-10-CM

## 2019-03-20 DIAGNOSIS — F419 Anxiety disorder, unspecified: Secondary | ICD-10-CM

## 2019-03-20 DIAGNOSIS — I5032 Chronic diastolic (congestive) heart failure: Secondary | ICD-10-CM | POA: Diagnosis not present

## 2019-03-20 DIAGNOSIS — J441 Chronic obstructive pulmonary disease with (acute) exacerbation: Secondary | ICD-10-CM

## 2019-03-20 MED ORDER — STIOLTO RESPIMAT 2.5-2.5 MCG/ACT IN AERS: 2.0000 | INHALATION_SPRAY | Freq: Every day | RESPIRATORY_TRACT | 6 refills | Status: DC

## 2019-03-20 NOTE — Telephone Encounter (Signed)
Copied from [place] [id number]. Topic: General - Inquiry >> Mar 20, 2019  4:54 PM Percell Belt A wrote: Reason for CRM: pt wife called in stated that she thought Dr was going to call in a depression med for pt today?  Please advise  Best number  [phone]

## 2019-03-20 NOTE — Progress Notes (Signed)
Virtual Visit via telephone Note  This visit type was conducted due to national recommendations for restrictions regarding the COVID-19 pandemic (e.g. social distancing).  This format is felt to be most appropriate for this patient at this time.  All issues noted in this document were discussed and addressed.  No physical exam was performed (except for noted visual exam findings with Video Visits).   I connected with Andre [name] today at  1:45 PM EST by a video enabled telemedicine application or telephone and verified that I am speaking with the correct person using two identifiers. Location patient: home Location provider: work Persons participating in the virtual visit: patient, provider, Mrs Spindel (wife)  I discussed the limitations, risks, security and privacy concerns of performing an evaluation and management service by telephone and the availability of in person appointments. I also discussed with the patient that there may be a patient responsible charge related to this service. The patient expressed understanding and agreed to proceed.  Interactive audio and video telecommunications were attempted between this provider and patient, however failed, due to patient having technical difficulties OR patient did not have access to video capability.  We continued and completed visit with audio only.   Reason for visit: follow-up  HPI: Hypersomnia: Patient notes he has had this since his cardiac bypass.  He has been evaluated by cardiology and pulmonology for fatigue and tiredness.  He notes he is tired all the time.  He can take a nap very easily.  Does not wake up well rested.  He does use his CPAP nightly.  He wonders if this should be retested.  CHF: He notes slight worsening of his chronic shortness of breath.  He does follow with cardiology and pulmonology and has an upcoming pulmonology visit.  No edema.  No orthopnea.  No PND.  He weighs himself daily and has had no significant weight  changes.  Depression: He notes it is hard for him to say whether or not he is depressed.  He just does not have the desire to do much of anything.  His wife does think he is.  He has been on Lexapro though had to decrease the dose because the 20 mg dose made him drowsy.  No SI.   ROS: See pertinent positives and negatives per HPI.  Past Medical History:  Diagnosis Date  . Atherosclerosis of abdominal aorta ([place])   . CAD (coronary artery disease)   . Cervical spondylosis 10/01/2013  . Chronic diastolic CHF (congestive heart failure) ([place])    a. 07/2016 Echo: >55%; b. 10/2016 Echo: EF 55-60%, Gr1 DD, Ao sclerosis w/o stenosis, sev dil LA; c. 08/2017 Echo: EF 60-65%, no rwma, Gr2 DD, mild AS, sev dil LA/RA.  [name] Coronary artery disease    a. 1998 s/p mini-cabg @ [facility] - LIMA->LAD;  b. 07/2016 St Echo: Inadequate HR w/ HTN response;  c.  08/2016 MV: EF 67%, no ischemia; d. 10/2016 NSTEMI/Cath: RCA 95p (4.0x26 Onyx DES), LIMA->LAD nl; e. 09/2017 Cath: LM 40/30, LAD 100ost, RI 80, LCX nl, OM2/3 nl, RCA patent stent, 102m, LIMA->LAD nl-->Med Rx.  . DDD (degenerative disc disease), cervical   . DDD (degenerative disc disease), lumbar   . Depression   . GERD (gastroesophageal reflux disease)   . Hyperlipidemia   . Hypertension   . Hypothyroidism   . PAF (paroxysmal atrial fibrillation) (H[facility])    a. s/p DCCV-->maintaining sinus on amiodarone;  b. CHA2DS2VASc = 5-->eliquis.  . Parkinson's disease ([place])  tremors  . Pleural effusion, right    a. 09/2017 s/p thoracentesis.  [name] PNA (pneumonia) 08/26/2017  . Pulmonary embolism ([place]) 2011  . Pulmonary fibrosis ([place])   . 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 Sleep apnea    wears CPAP  . Thrombocytopenia ([place])     Past Surgical History:  Procedure Laterality Date  . BACK SURGERY  1960  . CARDIAC CATHETERIZATION    . CHOLECYSTECTOMY  2010  . COLONOSCOPY WITH PROPOFOL N/A 06/07/2018   Procedure: COLONOSCOPY WITH PROPOFOL;  Surgeon: Lollie Sails, MD;   Location: [facility] ENDOSCOPY;  Service: Endoscopy;  Laterality: N/A;  . CORONARY ARTERY BYPASS GRAFT  01/07/1997  . CORONARY STENT INTERVENTION N/A 10/31/2016   Procedure: Coronary Stent Intervention;  Surgeon: Wellington Hampshire, MD;  Location: [place] CV LAB;  Service: Cardiovascular;  Laterality: N/A;  . ELECTROPHYSIOLOGIC STUDY N/A 07/14/2015   Procedure: CARDIOVERSION;  Surgeon: Yolonda Kida, MD;  Location: [facility] ORS;  Service: Cardiovascular;  Laterality: N/A;  . ELECTROPHYSIOLOGIC STUDY N/A 10/12/2015   Procedure: CARDIOVERSION;  Surgeon: Minna Merritts, MD;  Location: [facility] ORS;  Service: Cardiovascular;  Laterality: N/A;  . LEFT HEART CATH AND CORONARY ANGIOGRAPHY N/A 10/31/2016   Procedure: Left Heart Cath and Coronary Angiography;  Surgeon: Wellington Hampshire, MD;  Location: [place] [facility];  Service: Cardiovascular;  Laterality: N/A;  . OTHER SURGICAL HISTORY  1998   Bypass  . RIGHT/LEFT HEART CATH AND CORONARY ANGIOGRAPHY N/A 09/18/2017   Procedure: RIGHT/LEFT HEART CATH AND CORONARY ANGIOGRAPHY;  Surgeon: Wellington Hampshire, MD;  Location: [place] [facility];  Service: Cardiovascular;  Laterality: N/A;    Family History  Problem Relation Age of Onset  . Alcohol abuse Father     SOCIAL HX: Former smoker.   Current Outpatient Medications:  .  albuterol (PROVENTIL) (2.5 MG/3ML) 0.083% nebulizer solution, USE ONE AMPULE (3ML) IN NEBULIZER EVERY FOUR HOURS AS NEEDED FOR WHEEZING OR SHORTNESS OF BREATH, Disp: 360 mL, Rfl: 1 .  carbidopa-levodopa (SINEMET IR) 25-250 MG tablet, TAKE TWO TABLETS 3 TIMES DAILY (Patient taking differently: 2 tablets 3 (three) times daily. ), Disp: 540 tablet, Rfl: 3 .  carvedilol (COREG) 3.125 MG tablet, TAKE TWO TABLETS TWICE A DAY WITH MEALS, Disp: 120 tablet, Rfl: 0 .  cholecalciferol (VITAMIN D3) 25 MCG (1000 UT) tablet, Take 2,000 Units by mouth daily., Disp: , Rfl:  .  ELIQUIS 5 MG TABS tablet, TAKE ONE TABLET BY MOUTH TWICE DAILY, Disp: 60  tablet, Rfl: 5 .  entacapone (COMTAN) 200 MG tablet, Take 1 tablet (200 mg total) by mouth 3 (three) times daily., Disp: 270 tablet, Rfl: 2 .  escitalopram (LEXAPRO) 20 MG tablet, TAKE ONE TABLET EVERY DAY (Patient taking differently: Take 10 mg by mouth daily. ), Disp: 90 tablet, Rfl: 1 .  esomeprazole (NEXIUM) 20 MG capsule, Take 48 mg by mouth daily at 12 noon. , Disp: , Rfl:  .  fluticasone (FLONASE) 50 MCG/ACT nasal spray, TAKE 2 PUFFS IN EACH NOSTRIL DAILY, Disp: 16 g, Rfl: 3 .  furosemide (LASIX) 20 MG tablet, TAKE TWO TABLETS TWICE DAILY, Disp: 120 tablet, Rfl: 0 .  HYDROcodone-acetaminophen (NORCO/VICODIN) 5-325 MG tablet, Take by mouth., Disp: , Rfl:  .  isosorbide mononitrate (IMDUR) 30 MG 24 hr tablet, TAKE ONE TABLET BY MOUTH EVERY DAY, Disp: 30 tablet, Rfl: 5 .  ketoconazole (NIZORAL) 2 % cream, , Disp: , Rfl:  .  levothyroxine (SYNTHROID, LEVOTHROID) 50 MCG tablet, TAKE 1 TABLET EVERY  DAY ON EMPTY STOMACHWITH A GLASS OF WATER AT LEAST 30-60 MINBEFORE BREAKFAST, Disp: 90 tablet, Rfl: 1 .  losartan (COZAAR) 50 MG tablet, Take 1 tablet (50 mg total) by mouth daily., Disp: 90 tablet, Rfl: 2 .  mirabegron ER (MYRBETRIQ) 50 MG TB24 tablet, Take 1 tablet (50 mg total) by mouth daily., Disp: 30 tablet, Rfl: 11 .  modafinil (PROVIGIL) 200 MG tablet, TAKE 1 TABLET BY MOUTH TWICE DAILY, Disp: 180 tablet, Rfl: 0 .  mometasone (ELOCON) 0.1 % cream, , Disp: , Rfl:  .  Polyethyl Glycol-Propyl Glycol (SYSTANE OP), Place 1 drop into both eyes 2 (two) times daily as needed (dry eyes)., Disp: , Rfl:  .  potassium chloride SA (KLOR-CON) 20 MEQ tablet, TAKE ONE TABLET EVERY DAY, Disp: 90 tablet, Rfl: 0 .  rosuvastatin (CRESTOR) 10 MG tablet, TAKE 1 TABLET BY MOUTH DAILY, Disp: 90 tablet, Rfl: 0 .  Zinc 50 MG TABS, Take 50 mg by mouth daily., Disp: , Rfl:  .  buPROPion (WELLBUTRIN XL) 150 MG 24 hr tablet, Take 1 tablet (150 mg total) by mouth daily., Disp: 90 tablet, Rfl: 1 .  Tiotropium  Bromide-Olodaterol (STIOLTO RESPIMAT) 2.5-2.5 MCG/ACT AERS, Inhale 2 puffs into the lungs daily., Disp: 1 g, Rfl: 6  EXAM: This is a telehealth telephone visit and thus no physical exam was completed.  ASSESSMENT AND PLAN:  Discussed the following assessment and plan:  OSA (obstructive sleep apnea) Patient does note hypersomnia and not waking up well rested.  I wonder if his CPAP is titrated appropriately.  He sees pulmonology early next week and we will forward our note to them.  I am going to order CPAP titration study for him.  (HFpEF) heart failure with preserved ejection fraction ([place]) Relatively stable though does have some dyspnea that is likely pulmonary related.  He will follow up with his pulmonologist as planned.  He will continue to monitor his weights.  He will continue to see cardiology.  Anxiety and depression Patient does report some depression.  We will add Wellbutrin to his Lexapro.  Follow-up in 2 months.    I discussed the assessment and treatment plan with the patient. The patient was provided an opportunity to ask questions and all were answered. The patient agreed with the plan and demonstrated an understanding of the instructions.   The patient was advised to call back or seek an in-person evaluation if the symptoms worsen or if the condition fails to improve as anticipated.  I spent 21 minutes of nonface-to-face time with this patient during this visit.  [name], MD

## 2019-03-20 NOTE — Telephone Encounter (Signed)
Based on last appointment note dated 02/01/19, the patient was to continue using Stiolto. Prescription sent to pharmacy.  Called the patient wife and advised prescription sent. Nothing further needed.

## 2019-03-21 MED ORDER — BUPROPION HCL ER (XL) 150 MG PO TB24: 150.0000 mg | ORAL_TABLET | Freq: Every day | ORAL | 1 refills | Status: DC

## 2019-03-21 NOTE — Assessment & Plan Note (Signed)
Relatively stable though does have some dyspnea that is likely pulmonary related.  He will follow up with his pulmonologist as planned.  He will continue to monitor his weights.  He will continue to see cardiology.

## 2019-03-21 NOTE — Assessment & Plan Note (Signed)
Patient does report some depression.  We will add Wellbutrin to his Lexapro.  Follow-up in 2 months.

## 2019-03-21 NOTE — Assessment & Plan Note (Signed)
Patient does note hypersomnia and not waking up well rested.  I wonder if his CPAP is titrated appropriately.  He sees pulmonology early next week and we will forward our note to them.  I am going to order CPAP titration study for him.

## 2019-03-21 NOTE — Telephone Encounter (Signed)
I just sent the wellbutrin to his pharmacy.

## 2019-03-22 NOTE — Telephone Encounter (Signed)
LM on wife's VM that prescription was sent in. I asked that she called back if further questions.

## 2019-03-25 ENCOUNTER — Ambulatory Visit: Payer: PPO | Admitting: Pulmonary Disease

## 2019-03-25 ENCOUNTER — Encounter: Payer: Self-pay | Admitting: Pulmonary Disease

## 2019-03-25 ENCOUNTER — Ambulatory Visit (INDEPENDENT_AMBULATORY_CARE_PROVIDER_SITE_OTHER): Payer: PPO

## 2019-03-25 ENCOUNTER — Other Ambulatory Visit: Payer: Self-pay

## 2019-03-25 DIAGNOSIS — R0602 Shortness of breath: Secondary | ICD-10-CM

## 2019-03-25 LAB — CBC WITH DIFFERENTIAL/PLATELET
Basophils Absolute: 0.1 10*3/uL (ref 0.0–0.1)
Basophils Relative: 0.6 % (ref 0.0–3.0)
Eosinophils Absolute: 0.2 10*3/uL (ref 0.0–0.7)
Eosinophils Relative: 1.3 % (ref 0.0–5.0)
HCT: 44 % (ref 39.0–52.0)
Hemoglobin: 14.3 g/dL (ref 13.0–17.0)
Lymphocytes Relative: 14.3 % (ref 12.0–46.0)
Lymphs Abs: 1.8 10*3/uL (ref 0.7–4.0)
MCHC: 32.5 g/dL (ref 30.0–36.0)
MCV: 88.6 fl (ref 78.0–100.0)
Monocytes Absolute: 1.1 10*3/uL — ABNORMAL HIGH (ref 0.1–1.0)
Monocytes Relative: 9 % (ref 3.0–12.0)
Neutro Abs: 9.2 10*3/uL — ABNORMAL HIGH (ref 1.4–7.7)
Neutrophils Relative %: 74.8 % (ref 43.0–77.0)
Platelets: 107 10*3/uL — ABNORMAL LOW (ref 150.0–400.0)
RBC: 4.97 Mil/uL (ref 4.22–5.81)
RDW: 15.1 % (ref 11.5–15.5)
WBC: 12.3 10*3/uL — ABNORMAL HIGH (ref 4.0–10.5)

## 2019-03-25 LAB — COMPREHENSIVE METABOLIC PANEL
ALT: 6 U/L (ref 0–53)
AST: 15 U/L (ref 0–37)
Albumin: 3.9 g/dL (ref 3.5–5.2)
Alkaline Phosphatase: 93 U/L (ref 39–117)
BUN: 15 mg/dL (ref 6–23)
CO2: 27 mEq/L (ref 19–32)
Calcium: 9.2 mg/dL (ref 8.4–10.5)
Chloride: 98 mEq/L (ref 96–112)
Creatinine, Ser: 0.9 mg/dL (ref 0.40–1.50)
GFR: 80.91 mL/min (ref >60.00)
Glucose, Bld: 100 mg/dL — ABNORMAL HIGH (ref 70–99)
Potassium: 4.1 mEq/L (ref 3.5–5.1)
Sodium: 138 mEq/L (ref 135–145)
Total Bilirubin: 0.6 mg/dL (ref 0.2–1.2)
Total Protein: 6.6 g/dL (ref 6.0–8.3)

## 2019-03-25 LAB — TSH: TSH: 3.46 u[IU]/mL (ref 0.35–4.50)

## 2019-03-25 NOTE — Progress Notes (Signed)
Andre BRISCO    MA:[id number]    [birth year]  Primary Care Physician:Sonnenberg, Angela Adam, MD  Referring Physician: [name], MD [address] [place],  [place] [address]  Chief complaint: Follow-up for COPD, pulmonary fibrosis, sleep apnea.  HPI: 83 year old with history of pulmonary fibrosis, recurrent respiratory failure, eosinophilic pleural effusion, chronic diastolic heart failure, sleep apnea, Parkinson's  Previously followed [facility].  He was also evaluated at [facility] for pulmonary fibrosis thought to be secondary to prior asbestos exposure.  Recommendation was made to follow-up with a CT in January 2020.  However he cannot keep the appointment due to high co-pay and wishes to transfer care to [facility].  He has had at least 3 admissions over the past year for recurrent respiratory failure, HCAP, right pleural effusion status post thoracentesis on 09/19/2017 with cell count showing exudate with elevated eosinophils.   He used to remain active until age 24 when he had a CABG.  Has run 31 marathons in the past.  After his CABG he took up long distance cycling which he had to give up about 10 years ago.  Initially on Anoro.  This was changed to [facility] in May 2020 as he did not achieve adequate inspiratory flow.  Pets: No pets Occupation: Worked as a Actor ILD questionnaire 05/15/2018: Exposure to asbestos, no other significant exposure. Smoking history:20-pack-year smoker.  Quit smoking in 1985 Travel history: No significant recent travel Relevant family history: No family history of lung disease.  Interim History: Reports increasing dyspnea with fatigue, daytime somnolence for the past month.  He was evaluated by nurse practitioner pulmonary office and given extra dose of Lasix for lower extremity edema, doxy and prednisone for 5 days with minimal improvement in symptoms Denies any fevers, chills  Outpatient Encounter Medications  as of 03/25/2019  Medication Sig  . albuterol (PROVENTIL) (2.5 MG/3ML) 0.083% nebulizer solution USE ONE AMPULE (3ML) IN NEBULIZER EVERY FOUR HOURS AS NEEDED FOR WHEEZING OR SHORTNESS OF BREATH  . buPROPion (WELLBUTRIN XL) 150 MG 24 hr tablet Take 1 tablet (150 mg total) by mouth daily.  . carbidopa-levodopa (SINEMET IR) 25-250 MG tablet TAKE TWO TABLETS 3 TIMES DAILY (Patient taking differently: 2 tablets 3 (three) times daily. )  . carvedilol (COREG) 3.125 MG tablet TAKE TWO TABLETS TWICE A DAY WITH MEALS  . cholecalciferol (VITAMIN D3) 25 MCG (1000 UT) tablet Take 2,000 Units by mouth daily.  [name] ELIQUIS 5 MG TABS tablet TAKE ONE TABLET BY MOUTH TWICE DAILY  . entacapone (COMTAN) 200 MG tablet Take 1 tablet (200 mg total) by mouth 3 (three) times daily.  [name] escitalopram (LEXAPRO) 20 MG tablet TAKE ONE TABLET EVERY DAY (Patient taking differently: Take 10 mg by mouth daily. )  . esomeprazole (NEXIUM) 20 MG capsule Take 48 mg by mouth daily at 12 noon.   . fluticasone (FLONASE) 50 MCG/ACT nasal spray TAKE 2 PUFFS IN EACH NOSTRIL DAILY  . furosemide (LASIX) 20 MG tablet TAKE TWO TABLETS TWICE DAILY (Patient taking differently: Take 40 mg by mouth 2 (two) times daily. )  . HYDROcodone-acetaminophen (NORCO/VICODIN) 5-325 MG tablet Take by mouth.  . isosorbide mononitrate (IMDUR) 30 MG 24 hr tablet TAKE ONE TABLET BY MOUTH EVERY DAY  . ketoconazole (NIZORAL) 2 % cream   . levothyroxine (SYNTHROID, LEVOTHROID) 50 MCG tablet TAKE 1 TABLET EVERY DAY ON EMPTY STOMACHWITH A GLASS OF WATER AT LEAST 30-60 MINBEFORE BREAKFAST  . losartan (COZAAR)  50 MG tablet Take 1 tablet (50 mg total) by mouth daily.  . mirabegron ER (MYRBETRIQ) 50 MG TB24 tablet Take 1 tablet (50 mg total) by mouth daily.  . modafinil (PROVIGIL) 200 MG tablet TAKE 1 TABLET BY MOUTH TWICE DAILY  . mometasone (ELOCON) 0.1 % cream   . Polyethyl Glycol-Propyl Glycol (SYSTANE OP) Place 1 drop into both eyes 2 (two) times daily as needed (dry  eyes).  . potassium chloride SA (KLOR-CON) 20 MEQ tablet TAKE ONE TABLET EVERY DAY  . rosuvastatin (CRESTOR) 10 MG tablet TAKE 1 TABLET BY MOUTH DAILY  . STIOLTO RESPIMAT 2.5-2.5 MCG/ACT AERS INHALE 2 PUFFS INTO THE LUNGS DAILY  . Tiotropium Bromide-Olodaterol (STIOLTO RESPIMAT) 2.5-2.5 MCG/ACT AERS Inhale 2 puffs into the lungs daily.  . Zinc 50 MG TABS Take 50 mg by mouth daily.   No facility-administered encounter medications on file as of 03/25/2019.    Physical Exam: Blood pressure 122/74, pulse 69, temperature (!) 97.4 F (36.3 C), temperature source Temporal, height 5\' 7"  (1.702 m), weight 273 lb 6.4 oz (124 kg), SpO2 94 %. Gen:      No acute distress HEENT:  EOMI, sclera anicteric Neck:     No masses; no thyromegaly Lungs:    Bilateral basal crackles. CV:         Regular rate and rhythm; no murmurs Abd:      + bowel sounds; soft, non-tender; no palpable masses, no distension Ext:    No edema; adequate peripheral perfusion Skin:      Warm and dry; no rash Neuro: alert and oriented x 3 Psych: normal mood and affect  Data Reviewed: Imaging: CT chest 12/06/2011- peripheral and basal fibrotic changes, calcified pleural plaques.  CT chest 06/09/2015- peripheral and basal fibrotic changes with no honeycombing.  Calcified pleural plaques  CT high-resolution 05/30/2017- moderate centrilobular emphysema, mild basilar subpleural reticulation, groundglass and traction bronchiectasis.  No honeycombing.  Calcified granuloma in the right upper lobe, calcified pleural plaques.  CT high-resolution 06/08/2018- moderate emphysema, mild basilar reticulation, traction bronchiectasis.  No honeycombing, calcified pleural plaques with right pleural thickening.  Stable calcified granuloma.  I have reviewed the images personally. I have reviewed the images personally.  CT chest high-resolution [[facility]] 01/22/2018 1. Primarily basilar peripheral interlobular septal thickening. Findings may represent  changes of chronic edema vs early fibrosis. New small right and trace left pleural effusions with associated calcified pleural plaques. Correlate with history of asbestos exposure.  2. Apparent increased thickening of the right basilar pleura, incompletely assessed. Consider PET/CT or pleural fluid sampling for further evaluation if not already obtained. Differential considerations include fibrosis or malignancy such as mesothelioma. 3. Aortic valve calcifications.  4. Scattered less than 3 mm pulmonary nodules. If the patient is low risk for lung cancer, no further follow-up is recommended. If the patient is high risk for lung cancer, consider 12 month follow-up CT. (2017 Fleischner Guidelines)  PFTs: 09/25/2012 FVC 2.52 [69%], FEV1 1.95 [75%), F/F 77, TLC 72%, DLCO 70% Moderate obstructive airways, minimal restriction and diffusion defect.  06/12/2018 FVC 2.38 [16%], FEV1 1.66 [66%], F/F 70, TLC 3.95 (61%), DLCO 16.34 [73%] Moderate obstruction with restriction and minimal diffusion defect  Labs: ANA 01/22/2018- negative, CCP-negative  Pleural fluid 09/19/2017- LDH 462, total protein 3.9 WBC 3661, 59% eos, 27% lymphs, 10% neutrophils Cytology-mixed reactive and inflammatory cells.  No malignancy.  Cardiac Echocardiogram 08/28/2017- LVEF [id number], grade 2 diastolic dysfunction, mild aortic stenosis.  Severe dilatation of left atrium and right atrium, trivial  pericardial effusion.  Cardiac catheterization 09/18/2017 Significant three-vessel coronary artery disease with patent grafts.  Right heart catheterization shows mildly elevated filling pressures, minimal pulmonary hypertension Wedge 12, PA pressure 39/17 (24), cardiac output 5.08 L/min PVR 189, 2.4 Wood units  Sleep CPAP titration 11/04/2015- CPAP titrated to 12 cm of water.  Download 05/14/2018 Set pressure 12 cm of water 100% usage greater than 4 hours Residual AHI 1.2  Download 03/24/2019 Set pressure of 12 cm 100%  usage greater than 4 hours, residual AHI 1.8  Assessment:  Dyspnea Multifactorial from obesity, diastolic heart failure, coronary artery disease Suspect anxiety is a significant component to his episodes of dyspnea  Evaluated today with worsening dyspnea of unclear etiology.  This is associated with fatigue We will reevaluate with x-ray today Check baseline labs including metabolic panel, CBC, IgE, BNP and TSH  Emphysema COPD PFTs reviewed with moderate obstruction.   Was initially on Anoro.  This was changed to [place] Has been referred to pulmonary rehab.  Pulmonary fibrosis, asbestosis CT scan reviewed with basilar fibrosis, pleural calcifications consistent with asbestos exposure The fibrotic changes have remained stable since 2013 We will repeat high-res CT to reevaluate.  No need for anti-fibrotic treatment at present.  Right pleural effusion exudative, eosinophilic S.p thoracentesis dring hospitalization in 2019.  The cell count is eosinophilic which could be from pneumonia versus asbestos-related effusion Follow-up CT shows thickening of the pleura in that area We will need to monitor this area for possible mesothelioma.   Obstructive sleep apnea Stable on CPAP Download reviewed today with good compliance and effectiveness.  I do not think he has any issues with CPAP  Health maintenance 12/11/2018 -influenza 05/10/2016-Prevnar  Plan/Recommendations: - Chest x-ray, labs including CBC, metabolic panel, IgE, BNP, TSH to evaluate worsening dyspnea, fatigue - Will need repeat high-res CT to monitor pulmonary fibrosis, asbestosis - CPAP is working effectively.  Continue current therapy.  Marshell Garfinkel MD [facility] 03/25/2019, 3:04 PM  CC: [name], MD

## 2019-03-25 NOTE — Patient Instructions (Addendum)
We will check some labs today including comprehensive metabolic panel, CBC with differential, IgE, pro BNP and TSH We will get a chest x-ray today We will schedule a high-res CT to be done at [facility] in the next 2 to 4 weeks Follow-up with televisit in 4 weeks.

## 2019-03-26 ENCOUNTER — Telehealth: Payer: Self-pay | Admitting: Pulmonary Disease

## 2019-03-26 ENCOUNTER — Other Ambulatory Visit: Payer: Self-pay | Admitting: Neurology

## 2019-03-26 DIAGNOSIS — J181 Lobar pneumonia, unspecified organism: Secondary | ICD-10-CM

## 2019-03-26 LAB — PRO B NATRIURETIC PEPTIDE: NT-Pro BNP: 526 pg/mL — ABNORMAL HIGH (ref 0–486)

## 2019-03-26 LAB — IGE: IgE (Immunoglobulin E), Serum: 155 kU/L — ABNORMAL HIGH (ref <=114)

## 2019-03-26 MED ORDER — LEVOFLOXACIN 750 MG PO TABS: 750.0000 mg | ORAL_TABLET | Freq: Every day | ORAL | 0 refills | Status: DC

## 2019-03-26 NOTE — Telephone Encounter (Signed)
Advised pt's wife of results. She  understood and nothing further is needed.   Order placed for Covid testing   Advised to pick up sterile cup for sputum culture.   Routed to [place] to let her know, I spoke with pt.

## 2019-03-26 NOTE — Telephone Encounter (Signed)
Noted! Thank you

## 2019-03-26 NOTE — Telephone Encounter (Signed)
I called pt's wife but there was no answer. LM for her to call back.  Notes recorded by Marshell Garfinkel, MD on 03/26/2019 at 9:40 AM EST  Please let patient know that he has elevated WBC count and infiltrate on chest x-ray which is concerning for pneumonia  Call in levofloxacin 750 mg a day for 7 days and sputum for culture if he can bring up  Also set him up for COVID-19 testing as soon as possible.

## 2019-03-26 NOTE — Telephone Encounter (Signed)
Pt was called in levaquin today by Dr. Vaughan Browner re: cxr results.  Pharmacy has called saying that there is a drug interaction between levaquin and pt's lexapro- QT prolongation.    Dr. Vaughan Browner please advise.  Thanks!

## 2019-03-26 NOTE — Telephone Encounter (Signed)
Andre [name] wife is returning phone call. Andre [name] phone number is [phone]. 

## 2019-03-26 NOTE — Telephone Encounter (Signed)
Called pharmacy to change rx as requested.   Spoke to pt's wife to make aware.  Nothing further needed at this time- will close encounter.

## 2019-03-26 NOTE — Telephone Encounter (Signed)
ATC pt, no answer. Left message for pt to call back.  

## 2019-03-26 NOTE — Telephone Encounter (Signed)
I left a detailed message letting her know that it was ok to do the sputum sample at [facility].

## 2019-03-26 NOTE — Telephone Encounter (Signed)
Changed prescription to Augmentin 875 mg twice daily for 7 days Doxycycline 100 mg twice daily for 7 days  Cancel levofloxacin

## 2019-03-27 ENCOUNTER — Other Ambulatory Visit: Payer: Self-pay

## 2019-03-27 ENCOUNTER — Other Ambulatory Visit
Admission: RE | Admit: 2019-03-27 | Discharge: 2019-03-27 | Disposition: A | Discharge status: Home / Self Care | Payer: PPO | Source: Ambulatory Visit | Attending: Pulmonary Disease | Admitting: Pulmonary Disease

## 2019-03-27 ENCOUNTER — Telehealth: Payer: Self-pay | Admitting: Pulmonary Disease

## 2019-03-27 DIAGNOSIS — Z20822 Contact with and (suspected) exposure to covid-19: Secondary | ICD-10-CM

## 2019-03-27 NOTE — Telephone Encounter (Signed)
LMTCB

## 2019-03-27 NOTE — Telephone Encounter (Signed)
Spoke with pt's wife and explained that the CT was ordered to re-evaluate his pulmonary fibrosis. The scan is ordered every year to follow up on the progression on the disease to see if there are any major changes. She understood and nothing further is needed.

## 2019-03-28 ENCOUNTER — Telehealth: Payer: Self-pay | Admitting: Pulmonary Disease

## 2019-03-28 ENCOUNTER — Other Ambulatory Visit
Admission: RE | Admit: 2019-03-28 | Discharge: 2019-03-28 | Disposition: A | Discharge status: Home / Self Care | Payer: PPO | Source: Ambulatory Visit | Attending: Pulmonary Disease | Admitting: Pulmonary Disease

## 2019-03-28 DIAGNOSIS — J181 Lobar pneumonia, unspecified organism: Secondary | ICD-10-CM | POA: Diagnosis not present

## 2019-03-28 LAB — NOVEL CORONAVIRUS, NAA: SARS-CoV-2, NAA: NOT DETECTED

## 2019-03-28 NOTE — Telephone Encounter (Signed)
Okay to get a sooner CT scan but I would like him to complete his antibiotics first and await COVID testing Schedule CT scan for early jan

## 2019-03-28 NOTE — Telephone Encounter (Signed)
Spoke with spouse and advised ok to take sputum to [facility] is already in [place]   She is concerned that his CT is not scheduled until Feb 2021 and wants to know if he can go ahead and get this done now. Dr Vaughan Browner, please advise if we need to do the scan sooner, thanks!

## 2019-03-28 NOTE — Telephone Encounter (Signed)
I called and spoke with patient and his wife after speaking with Mohammed at [facility] to advise them that the lab did not get enough of a specimen to send off and that he will need to collect another one. His wife states that they will try again tomorrow. Andre [name] states that he cant get much up. I advised him that if he couldn't to let us know and I would relay the message to Dr. Vaughan Browner.

## 2019-03-28 NOTE — Telephone Encounter (Signed)
Called and spoke with pt letting him know the info stated by Dr. Vaughan Browner and he verbalized understanding. Stated to pt that we will move his CT to beginning January 2021 and he verbalized understanding.  [facility], can you please help [place] out with getting pt's CT appt moved up from 05/31/2019 to beginning part of January 2021. [name] is the one who needs to be talked to in regards to having the CT date and time changed but we do need to move it up to beginning part of January 2021.

## 2019-03-28 NOTE — Telephone Encounter (Signed)
R/s appt to 1/6 @ 2:30, check in at 2:15.  Called & spoke to pt to give updated appt info.

## 2019-03-28 NOTE — Telephone Encounter (Signed)
I called pt back wife is not home he will have her call back she needs to call central scheduling to move the appt up [phone] or she can be transferred over to them

## 2019-03-29 ENCOUNTER — Telehealth: Payer: Self-pay | Admitting: Pulmonary Disease

## 2019-03-29 LAB — EXPECTORATED SPUTUM ASSESSMENT W GRAM STAIN, RFLX TO RESP C

## 2019-03-29 NOTE — Telephone Encounter (Signed)
Called and spoke with the patient. Advised him of the response received from Dr. Vaughan Browner. He voiced understanding. I told him if anything changes to give [place] a call. Nothing further needed at this time.

## 2019-03-29 NOTE — Telephone Encounter (Signed)
Spoke with [name]. She stated that the patient is not able to complete the sputum collection. She stated that he is starting to feel better and is simply not able to cough up enough for the sample. Advised her I would let Dr. Vaughan Browner know.   Dr. Vaughan Browner, please advise. Thanks!

## 2019-03-29 NOTE — Telephone Encounter (Signed)
I am glad he is starting to feel better.  If he cannot make sputum then we can hold off on repeating the culture

## 2019-03-29 NOTE — Telephone Encounter (Signed)
Manuela Schwartz from microbiology at [facility] called to document that call was received.  Let her know I s/w Marisa.  Was not documented for them in case provider wondering about results.

## 2019-04-05 ENCOUNTER — Ambulatory Visit: Payer: PPO

## 2019-04-06 ENCOUNTER — Other Ambulatory Visit: Payer: Self-pay | Admitting: Cardiovascular Disease

## 2019-04-08 ENCOUNTER — Telehealth: Payer: Self-pay

## 2019-04-08 NOTE — Telephone Encounter (Signed)
Pt notified of negative COVID-19 results. Understanding verbalized.  Andre [name]   

## 2019-04-22 ENCOUNTER — Other Ambulatory Visit: Payer: Self-pay | Admitting: Family Medicine

## 2019-04-24 ENCOUNTER — Ambulatory Visit
Admission: RE | Admit: 2019-04-24 | Discharge: 2019-04-24 | Disposition: A | Discharge status: Home / Self Care | Payer: PPO | Source: Ambulatory Visit | Attending: Pulmonary Disease | Admitting: Pulmonary Disease

## 2019-04-24 ENCOUNTER — Other Ambulatory Visit: Payer: Self-pay

## 2019-04-24 DIAGNOSIS — R0602 Shortness of breath: Secondary | ICD-10-CM | POA: Insufficient documentation

## 2019-04-29 ENCOUNTER — Telehealth: Payer: Self-pay | Admitting: Pulmonary Disease

## 2019-04-29 ENCOUNTER — Telehealth: Payer: Self-pay | Admitting: Cardiovascular Disease

## 2019-04-29 NOTE — Telephone Encounter (Signed)
Please let patient know that CT scan shows that scarring in the lung slightly worse.  Make follow-up appointment in clinic to discuss results and plan for next steps.

## 2019-04-29 NOTE — Telephone Encounter (Signed)
CT showed coronary calcifications which is expected and known.  Also showed atrial enlargement which we know from echo.  Nothing surprising.

## 2019-04-29 NOTE — Telephone Encounter (Signed)
Message fwd to Dr. Arida ?

## 2019-04-29 NOTE — Telephone Encounter (Signed)
Called and spoke with pt's wife [name] who is requesting to know the results of pt's ct. Stated to her that we would check with Dr. Vaughan Browner and then get back with them as soon as we could and she verbalized understanding.  Dr. Vaughan Browner, please advise on the results of pt's recent CT. Thanks!

## 2019-04-29 NOTE — Telephone Encounter (Signed)
Patient wife wants Dr. Fletcher Anon to review recent CT for Cardiac findings.  Please call.

## 2019-04-30 NOTE — Telephone Encounter (Signed)
DPR on file. lmom with Dr. Tyrell Antonio response. Patient or his wife is to contact the office if any questions.

## 2019-04-30 NOTE — Telephone Encounter (Signed)
Called and spoke with both pt and wife letting them know the info stated by Dr. Vaughan Browner in regards to ct results and stated to them that he wants [place] to schedule a f/u to discuss results and get plan together. appt was scheduled for pt Friday, 1/15 at 11:15.  Nothing further needed.

## 2019-04-30 NOTE — Telephone Encounter (Signed)
LMTCB x 1 

## 2019-04-30 NOTE — Telephone Encounter (Signed)
Pt's wife returning call.  [phone]

## 2019-05-03 ENCOUNTER — Ambulatory Visit: Payer: PPO | Admitting: Pulmonary Disease

## 2019-05-03 ENCOUNTER — Encounter: Payer: Self-pay | Admitting: Pulmonary Disease

## 2019-05-03 ENCOUNTER — Other Ambulatory Visit: Payer: Self-pay

## 2019-05-03 VITALS — BP 130/70 | HR 69 | Ht 68.0 in | Wt 266.2 lb

## 2019-05-03 DIAGNOSIS — J449 Chronic obstructive pulmonary disease, unspecified: Secondary | ICD-10-CM | POA: Diagnosis not present

## 2019-05-03 DIAGNOSIS — J849 Interstitial pulmonary disease, unspecified: Secondary | ICD-10-CM

## 2019-05-03 NOTE — Patient Instructions (Addendum)
I have reviewed your CT scan that shows scarring in the lung is slightly worse Start paperwork for medication called Esbriet Follow-up in 4 to 6 weeks.  Start [employer]

## 2019-05-03 NOTE — Progress Notes (Signed)
Andre BETKER    [id number]    09-11-1935  Primary Care Physician:Sonnenberg, Angela Adam, MD  Referring Physician: [name], MD [address] [place],  [place] [address]  Chief complaint: Follow-up for COPD, asbestos related pulmonary fibrosis, sleep apnea.  HPI: 84 year old with history of pulmonary fibrosis, recurrent respiratory failure, eosinophilic pleural effusion, chronic diastolic heart failure, sleep apnea, Parkinson's  Previously followed [facility].  He was also evaluated at [facility] for pulmonary fibrosis thought to be secondary to prior asbestos exposure.  Recommendation was made to follow-up with a CT in January 2020.  However he cannot keep the appointment due to high co-pay and wishes to transfer care to [facility].  He has had at least 3 admissions over the past year for recurrent respiratory failure, HCAP, right pleural effusion status post thoracentesis on 09/19/2017 with cell count showing exudate with elevated eosinophils.   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  Has run 35 marathons in the past.  After his CABG he took up long distance cycling which he had to give up about 10 years ago.  Initially on Anoro.  This was changed to [facility] in May 2020 as he did not achieve adequate inspiratory flow.  Pets: No pets Occupation: Worked as a Actor ILD questionnaire 05/15/2018: Exposure to asbestos, no other significant exposure. Smoking history:20-pack-year smoker.  Quit smoking in 1985 Travel history: No significant recent travel Relevant family history: No family history of lung disease.  Interim History: Last seen in pulmonary clinic in December with chest x-ray showing right lung pneumonia for which she was treated with antibiotics Follow-up CT scan shows resolution of pneumonia but shows progression of pulmonary fibrosis.  He is here to discuss results Continues to have dyspnea on exertion, cough with  white mucus.  Denies any fevers, chills  Outpatient Encounter Medications as of 05/03/2019  Medication Sig  . albuterol (PROVENTIL) (2.5 MG/3ML) 0.083% nebulizer solution USE ONE AMPULE (3ML) IN NEBULIZER EVERY FOUR HOURS AS NEEDED FOR WHEEZING OR SHORTNESS OF BREATH  . buPROPion (WELLBUTRIN XL) 150 MG 24 hr tablet Take 1 tablet (150 mg total) by mouth daily.  . carbidopa-levodopa (SINEMET IR) 25-250 MG tablet TAKE TWO TABLETS 3 TIMES DAILY  . carvedilol (COREG) 3.125 MG tablet TAKE TWO TABLETS TWICE A DAY WITH MEALS  . ELIQUIS 5 MG TABS tablet TAKE ONE TABLET BY MOUTH TWICE DAILY  . entacapone (COMTAN) 200 MG tablet Take 1 tablet (200 mg total) by mouth 3 (three) times daily.  [name] escitalopram (LEXAPRO) 20 MG tablet TAKE ONE TABLET EVERY DAY (Patient taking differently: Take 10 mg by mouth daily. )  . esomeprazole (NEXIUM) 20 MG capsule Take 48 mg by mouth daily at 12 noon.   . fluticasone (FLONASE) 50 MCG/ACT nasal spray TAKE 2 PUFFS IN EACH NOSTRIL DAILY  . furosemide (LASIX) 20 MG tablet TAKE TWO TABLETS TWICE DAILY  . HYDROcodone-acetaminophen (NORCO/VICODIN) 5-325 MG tablet Take by mouth.  . isosorbide mononitrate (IMDUR) 30 MG 24 hr tablet TAKE ONE TABLET BY MOUTH EVERY DAY  . ketoconazole (NIZORAL) 2 % cream   . levothyroxine (SYNTHROID) 50 MCG tablet TAKE ONE TABLET ON AN EMPTY STOMACH WITHA GLASS OF WATER AT LEAST 30 TO 60 MINUTES BEFORE BREAKFAST  . losartan (COZAAR) 50 MG tablet Take 1 tablet (50 mg total) by mouth daily.  . mirabegron ER (MYRBETRIQ) 50 MG TB24 tablet Take 1 tablet (50  mg total) by mouth daily.  . modafinil (PROVIGIL) 200 MG tablet TAKE 1 TABLET BY MOUTH TWICE DAILY  . mometasone (ELOCON) 0.1 % cream   . Polyethyl Glycol-Propyl Glycol (SYSTANE OP) Place 1 drop into both eyes 2 (two) times daily as needed (dry eyes).  . potassium chloride SA (KLOR-CON) 20 MEQ tablet TAKE ONE TABLET EVERY DAY  . rosuvastatin (CRESTOR) 10 MG tablet TAKE 1 TABLET BY MOUTH DAILY  .  Tiotropium Bromide-Olodaterol (STIOLTO RESPIMAT) 2.5-2.5 MCG/ACT AERS Inhale 2 puffs into the lungs daily.  . [DISCONTINUED] cholecalciferol (VITAMIN D3) 25 MCG (1000 UT) tablet Take 2,000 Units by mouth daily.  . [DISCONTINUED] levofloxacin (LEVAQUIN) 750 MG tablet Take 1 tablet (750 mg total) by mouth daily.  . [DISCONTINUED] STIOLTO RESPIMAT 2.5-2.5 MCG/ACT AERS INHALE 2 PUFFS INTO THE LUNGS DAILY  . [DISCONTINUED] Zinc 50 MG TABS Take 50 mg by mouth daily.   No facility-administered encounter medications on file as of 05/03/2019.   Physical Exam: Blood pressure 130/70, pulse 69, height 5\' 8"  (1.727 m), weight 266 lb 3.2 oz (120.7 kg), SpO2 96 %. Gen:      No acute distress HEENT:  EOMI, sclera anicteric Neck:     No masses; no thyromegaly Lungs:    Clear to auscultation bilaterally; normal respiratory effort CV:         Regular rate and rhythm; no murmurs Abd:      + bowel sounds; soft, non-tender; no palpable masses, no distension Ext:    No edema; adequate peripheral perfusion Skin:      Warm and dry; no rash Neuro: alert and oriented x 3 Psych: normal mood and affect  Data Reviewed: Imaging: CT chest 12/06/2011- peripheral and basal fibrotic changes, calcified pleural plaques.  CT chest 06/09/2015- peripheral and basal fibrotic changes with no honeycombing.  Calcified pleural plaques  CT high-resolution 05/30/2017- moderate centrilobular emphysema, mild basilar subpleural reticulation, groundglass and traction bronchiectasis.  No honeycombing.  Calcified granuloma in the right upper lobe, calcified pleural plaques.  CT high-resolution 06/08/2018- moderate emphysema, mild basilar reticulation, traction bronchiectasis.  No honeycombing, calcified pleural plaques with right pleural thickening.  Stable calcified granuloma.  I have reviewed the images personally. I have reviewed the images personally.  CT chest high-resolution [[facility]] 01/22/2018 1. Primarily basilar peripheral  interlobular septal thickening. Findings may represent changes of chronic edema vs early fibrosis. New small right and trace left pleural effusions with associated calcified pleural plaques. Correlate with history of asbestos exposure.  2. Apparent increased thickening of the right basilar pleura, incompletely assessed. Consider PET/CT or pleural fluid sampling for further evaluation if not already obtained. Differential considerations include fibrosis or malignancy such as mesothelioma. 3. Aortic valve calcifications.  4. Scattered less than 3 mm pulmonary nodules. If the patient is low risk for lung cancer, no further follow-up is recommended. If the patient is high risk for lung cancer, consider 12 month follow-up CT. (2017 Fleischner Guidelines)  PFTs: 09/25/2012 FVC 2.52 [69%], FEV1 1.95 [75%), F/F 77, TLC 72%, DLCO 70% Moderate obstructive airways, minimal restriction and diffusion defect.  06/12/2018 FVC 2.38 [16%], FEV1 1.66 [66%], F/F 70, TLC 3.95 (61%), DLCO 16.34 [73%] Moderate obstruction with restriction and minimal diffusion defect  Labs: ANA 01/22/2018- negative, CCP-negative  Pleural fluid 09/19/2017- LDH 462, total protein 3.9 WBC 3661, 59% eos, 27% lymphs, 10% neutrophils Cytology-mixed reactive and inflammatory cells.  No malignancy.  Cardiac Echocardiogram 08/28/2017- LVEF 123456, grade 2 diastolic dysfunction, mild aortic stenosis.  Severe dilatation of left  atrium and right atrium, trivial pericardial effusion.  Cardiac catheterization 09/18/2017 Significant three-vessel coronary artery disease with patent grafts.  Right heart catheterization shows mildly elevated filling pressures, minimal pulmonary hypertension Wedge 12, PA pressure 39/17 (24), cardiac output 5.08 L/min PVR 189, 2.4 Wood units  Sleep CPAP titration 11/04/2015- CPAP titrated to 12 cm of water.  Download 05/14/2018 Set pressure 12 cm of water 100% usage greater than 4 hours Residual AHI  1.2  Download 03/24/2019 Set pressure of 12 cm 100% usage greater than 4 hours, residual AHI 1.8  Assessment:  Pulmonary fibrosis, asbestosis CT scan reviewed with basilar fibrosis, pleural calcifications consistent with asbestos exposure Follow-up CT shows progression and fibrosis. He will benefit from initiation of antifibrotic Discussed risk/benefit in detail with patient and wife today and have decided to initiate this.  Dyspnea is multifactorial from ILD, obesity, diastolic heart failure, coronary artery disease Suspect anxiety is a significant component to his episodes of dyspnea Will benefit from pulmonary rehab but will like to hold off until the Covid pandemic has improved  Emphysema COPD PFTs reviewed with moderate obstruction.   Was initially on Anoro.  This was changed to Stiolto  Right pleural effusion exudative, eosinophilic S.p thoracentesis dring hospitalization in 2019.  The cell count is eosinophilic which could be from pneumonia versus asbestos-related effusion Follow-up CT shows thickening of the pleura in that area We will need to monitor this area for possible mesothelioma.   Obstructive sleep apnea Stable on CPAP Download reviewed with good compliance and effectiveness.  I do not think he has any issues with CPAP  Health maintenance 12/11/2018 -influenza 05/10/2016-Prevnar  This appointment required 45 minutes of patient care (this includes precharting, chart review, review of results, face-to-face care, etc.).  Plan/Recommendations: - Start Esbriet - CPAP is working effectively.  Continue current therapy.  Marshell Garfinkel MD [facility] 05/03/2019, 12:14 PM  CC: [name], MD

## 2019-05-06 ENCOUNTER — Telehealth: Payer: Self-pay | Admitting: Cardiovascular Disease

## 2019-05-06 ENCOUNTER — Telehealth: Payer: Self-pay | Admitting: Pharmacy Technician

## 2019-05-06 NOTE — Telephone Encounter (Signed)
*  STAT* If patient is at the pharmacy, call can be transferred to refill team.   1. Which medications need to be refilled? (please list name of each medication and dose if known) furosemide (LASIX) 20 MG - 2 tablets twice daily   2. Which pharmacy/location (including street and city if local pharmacy) is medication to be sent to? [facility]   3. Do they need a 30 day or 90 day supply? 90 day

## 2019-05-06 NOTE — Telephone Encounter (Signed)
Submitted a Prior Authorization request to [facility] for [place] via Cover My Meds. Will update once we receive a response. Plan prefers 267mg  capsules.

## 2019-05-06 NOTE — Telephone Encounter (Signed)
Received [employer]. Will update as we work through the benefits process.

## 2019-05-07 NOTE — Telephone Encounter (Signed)
Received notification through Covermymeds that Esbriet PA was denied. Did not give reason online. Will await denial letter.  Pharmacy team will work on appeal.

## 2019-05-08 ENCOUNTER — Telehealth: Payer: Self-pay | Admitting: Pulmonary Disease

## 2019-05-08 NOTE — Telephone Encounter (Signed)
Please advise 

## 2019-05-09 ENCOUNTER — Other Ambulatory Visit: Payer: Self-pay | Admitting: Pulmonary Disease

## 2019-05-09 NOTE — Telephone Encounter (Signed)
Pharmacy team has already started prior authorization process via Cover my meds. Now currently working on appeal for [employer].  Thanks!

## 2019-05-10 ENCOUNTER — Other Ambulatory Visit: Payer: Self-pay | Admitting: Cardiovascular Disease

## 2019-05-10 NOTE — Telephone Encounter (Signed)
Submitted a Prior Authorization request to [employer] for OFEV 150mg via Cover My Meds. Will update once we receive a response.    

## 2019-05-10 NOTE — Telephone Encounter (Signed)
He has a diagnosis of asbestosis and progressive fibrosing interstitial lung disease Can we change try that diagnosis or change the order to ofev 150 mg bid

## 2019-05-13 ENCOUNTER — Other Ambulatory Visit: Payer: Self-pay | Admitting: Urology

## 2019-05-13 ENCOUNTER — Other Ambulatory Visit: Payer: Self-pay | Admitting: Cardiovascular Disease

## 2019-05-13 NOTE — Telephone Encounter (Signed)
Received notification from [facility] regarding a prior authorization for OFEV 150mg . Authorization has been APPROVED from 05/10/19 to 05/09/20.   Authorization # [id number]  Ran test claim, patient's copay for 1 month of Ofev is $2,670.94.  8:54 AM Beatriz Chancellor, CPhT

## 2019-05-13 NOTE — Telephone Encounter (Signed)
[name] called and we spoke about Ofev approval and Patient Assistance. Wife provided information to apply for [place] copay assistance grant to cover Ofev. Patient's approval dates are 04/19/19- 04/17/20  PCN: AS BIN: [id number] Group: [id number] ID: [id number] Processing: 08 Phone: [phone] Fax: [phone]  11:38 AM Carly Applegate Delice Lesch, CPhT

## 2019-05-15 ENCOUNTER — Telehealth: Payer: Self-pay | Admitting: Pulmonary Disease

## 2019-05-15 NOTE — Telephone Encounter (Signed)
Called to assist patient with specialty pharmacy set up but no answer. Left voicemail requesting a return call.  Mariella Saa, PharmD, [place], [place] Clinical Specialty Pharmacist [phone]  05/15/2019 3:58 PM

## 2019-05-15 NOTE — Telephone Encounter (Signed)
Per patient's chart/1.18.21 phone note - [name] Yopp just called patient about 30 mins ago to set up specialty pharmacy.  Called spoke with spouse [name] - she is at the grocery store and didn't hear her phone ring.  Discussed with Museum/gallery conservator via [place] will call spouse to follow up tomorrow.  Spouse [name] is okay with this and voiced understanding.  Will sign off (as authorized by [employer]).  Documented in phone from 1/18 that spouse was calling.

## 2019-05-15 NOTE — Telephone Encounter (Signed)
Per 05/15/19 phone note, patient spouse Andre [name] calling about status of Ofev.  Advised Andre [name] that [employer] left her a voicemail about 30 mins ago to set up specialty pharmacy - she stated she is at the grocery store and did not hear her phone.  Andre [name] is okay with call back tomorrow and will look for [name]'s call.

## 2019-05-20 DIAGNOSIS — J45909 Unspecified asthma, uncomplicated: Secondary | ICD-10-CM | POA: Diagnosis not present

## 2019-05-20 DIAGNOSIS — J449 Chronic obstructive pulmonary disease, unspecified: Secondary | ICD-10-CM | POA: Diagnosis not present

## 2019-05-20 DIAGNOSIS — G4733 Obstructive sleep apnea (adult) (pediatric): Secondary | ICD-10-CM | POA: Diagnosis not present

## 2019-05-20 DIAGNOSIS — J441 Chronic obstructive pulmonary disease with (acute) exacerbation: Secondary | ICD-10-CM | POA: Diagnosis not present

## 2019-05-20 NOTE — Progress Notes (Signed)
Subjective:  Patient and wife connected via phone with the pharmacy team for [facility].  Pertinent past medical history includes progressive fibrosing interstitial lung disease, paroxysmal atrial fibrillation, CAD, HFpEF, HTN, OSA, COPD, GERD, Barrett's esophagus, hypothyroidism, parkinson's disease, obesity and history of thrombocytopenia. He is naive to anti-fibrotic therapy.   Objective: Allergies  Allergen Reactions  . Pravastatin Other (See Comments)  . Prednisone Other (See Comments)    Pt states that med makes him hyper Pt states that med makes him hyper    Outpatient Encounter Medications as of 05/22/2019  Medication Sig  . albuterol (PROVENTIL) (2.5 MG/3ML) 0.083% nebulizer solution USE ONE AMPULE(3ML) VIA NEBULIZER EVERY 4 HOURS AS NEEDED FOR WHEEZING OR SHORTNESS OF BREATH  . buPROPion (WELLBUTRIN XL) 150 MG 24 hr tablet Take 1 tablet (150 mg total) by mouth daily.  . carbidopa-levodopa (SINEMET IR) 25-250 MG tablet TAKE TWO TABLETS 3 TIMES DAILY  . carvedilol (COREG) 3.125 MG tablet TAKE TWO TABLETS TWICE A DAY WITH MEALS  . ELIQUIS 5 MG TABS tablet TAKE ONE TABLET BY MOUTH TWICE DAILY  . entacapone (COMTAN) 200 MG tablet Take 1 tablet (200 mg total) by mouth 3 (three) times daily.  [name] escitalopram (LEXAPRO) 20 MG tablet TAKE ONE TABLET EVERY DAY (Patient taking differently: Take 10 mg by mouth daily. )  . esomeprazole (NEXIUM) 20 MG capsule Take 48 mg by mouth daily at 12 noon.   . fluticasone (FLONASE) 50 MCG/ACT nasal spray TAKE 2 PUFFS IN EACH NOSTRIL DAILY  . furosemide (LASIX) 20 MG tablet TAKE TWO TABLETS TWICE A DAY  . HYDROcodone-acetaminophen (NORCO/VICODIN) 5-325 MG tablet Take by mouth.  . isosorbide mononitrate (IMDUR) 30 MG 24 hr tablet TAKE ONE TABLET BY MOUTH EVERY DAY  . ketoconazole (NIZORAL) 2 % cream   . levothyroxine (SYNTHROID) 50 MCG tablet TAKE ONE TABLET ON AN EMPTY STOMACH WITHA GLASS OF WATER AT LEAST 30 TO 60 MINUTES BEFORE BREAKFAST  .  losartan (COZAAR) 50 MG tablet Take 1 tablet (50 mg total) by mouth daily.  . modafinil (PROVIGIL) 200 MG tablet TAKE 1 TABLET BY MOUTH TWICE DAILY  . mometasone (ELOCON) 0.1 % cream   . MYRBETRIQ 50 MG TB24 tablet TAKE ONE TABLET EVERY DAY  . Polyethyl Glycol-Propyl Glycol (SYSTANE OP) Place 1 drop into both eyes 2 (two) times daily as needed (dry eyes).  . potassium chloride SA (KLOR-CON) 20 MEQ tablet TAKE ONE TABLET EVERY DAY  . rosuvastatin (CRESTOR) 10 MG tablet TAKE 1 TABLET BY MOUTH DAILY  . Tiotropium Bromide-Olodaterol (STIOLTO RESPIMAT) 2.5-2.5 MCG/ACT AERS Inhale 2 puffs into the lungs daily.   No facility-administered encounter medications on file as of 05/22/2019.     Immunization History  Administered Date(s) Administered  . Fluad Quad(high Dose 65+) 12/11/2018  . Influenza Split 03/18/2012  . Influenza, High Dose Seasonal PF 01/13/2016, 01/26/2017, 12/29/2017  . Influenza-Unspecified 01/06/2016, 01/26/2017  . Pneumococcal Conjugate-13 05/10/2016  . Td 02/08/2017  . Zoster 04/19/2011  . Zoster Recombinat (Shingrix) 12/07/2017, 04/02/2018     HRCT 06/08/2018- moderate emphysema, mild basilar reticulation, traction bronchiectasis.  No honeycombing, calcified pleural plaques with right pleural thickening.  Stable calcified granuloma. Shows progression and fibrosis.  PFT's 06/12/2018 FVC 2.38 [16%], FEV1 1.66 [66%], F/F 70, TLC 3.95 (61%), DLCO 16.34 [73%] Moderate obstruction with restriction and minimal diffusion defect  Chest X-ray  CMP     Component Value Date/Time   NA 138 03/25/2019 1516   NA 139 10/09/2017 1441  K 4.1 03/25/2019 1516   CL 98 03/25/2019 1516   CO2 27 03/25/2019 1516   GLUCOSE 100 (H) 03/25/2019 1516   BUN 15 03/25/2019 1516   BUN 23 10/09/2017 1441   CREATININE 0.90 03/25/2019 1516   CREATININE 0.95 12/06/2011 1554   CALCIUM 9.2 03/25/2019 1516   PROT 6.6 03/25/2019 1516   PROT 7.0 12/03/2015 0842   PROT 7.6 09/20/2011 1529   ALBUMIN  3.9 03/25/2019 1516   ALBUMIN 4.2 12/03/2015 0842   ALBUMIN 3.7 09/20/2011 1529   AST 15 03/25/2019 1516   AST 18 09/20/2011 1529   ALT 6 03/25/2019 1516   ALT 23 09/20/2011 1529   ALKPHOS 93 03/25/2019 1516   ALKPHOS 133 09/20/2011 1529   BILITOT 0.6 03/25/2019 1516   BILITOT 0.8 12/03/2015 0842   BILITOT 0.5 09/20/2011 1529   GFRNONAA >60 12/17/2018 1327   GFRNONAA >60 12/06/2011 1554   GFRAA >60 12/17/2018 1327   GFRAA >60 12/06/2011 1554     CBC    Component Value Date/Time   WBC 12.3 (H) 03/25/2019 1516   RBC 4.97 03/25/2019 1516   HGB 14.3 03/25/2019 1516   HGB 14.9 10/06/2015 0900   HCT 44.0 03/25/2019 1516   HCT 46.4 10/06/2015 0900   PLT 107.0 (L) 03/25/2019 1516   PLT 142 (L) 10/06/2015 0900   MCV 88.6 03/25/2019 1516   MCV 87 10/06/2015 0900   MCV 86 08/12/2014 0816   MCH 28.4 12/17/2018 1327   MCHC 32.5 03/25/2019 1516   RDW 15.1 03/25/2019 1516   RDW 16.9 (H) 10/06/2015 0900   RDW 14.8 (H) 08/12/2014 0816   LYMPHSABS 1.8 03/25/2019 1516   LYMPHSABS 2.1 10/06/2015 0900   LYMPHSABS 1.6 08/12/2014 0816   MONOABS 1.1 (H) 03/25/2019 1516   MONOABS 0.8 08/12/2014 0816   EOSABS 0.2 03/25/2019 1516   EOSABS 0.1 10/06/2015 0900   EOSABS 0.1 08/12/2014 0816   BASOSABS 0.1 03/25/2019 1516   BASOSABS 0.0 10/06/2015 0900   BASOSABS 0.1 08/12/2014 0816     LFT's Hepatic Function Latest Ref Rng & Units 03/25/2019 12/17/2018 03/02/2018  Total Protein 6.0 - 8.3 g/dL 6.6 7.3 6.8  Albumin 3.5 - 5.2 g/dL 3.9 4.0 3.8  AST 0 - 37 U/L 15 24 16   ALT 0 - 53 U/L 6 10 5   Alk Phosphatase 39 - 117 U/L 93 97 92  Total Bilirubin 0.2 - 1.2 mg/dL 0.6 0.7 0.8     Assessment and Plan  1. Ofev Medication Management  Patient counseled on purpose, proper use, and potential adverse effects including diarrhea, nausea, vomiting, abdominal pain, decreased appetite, weight loss, and increased blood pressure. Counseled on potential serious adverse events including increased risk of  bleeding and cardiovascular events.  He is currently on Eliquis and will monitor for signs and symptoms of bleeding.  Patient verbalized understanding.  Stressed the importance of routine lab monitoring. Will monitor LFT's every month for the first 6 months of treatment then every 3 months. Will monitor CBC every 3 months.  Patient verbalized understanding.  Ofev dose will be 150 mg capsule every 12 hours with food. Stressed importance of taking with food to minimize stomach upset.  Patient was approved for a grant through the assistance fund.  Prescription sent to [facility] along with grant information.  Prescription processing can take up to 24 to 48 hours.  Advised patient he should be getting a call from [facility] to schedule for shipment of Ofev.  Patient given phone  number to [facility] and instructed to call if he has not heard from the pharmacy by Friday afternoon.  Patient verbalized understanding.  He has a follow-up appointment with Dr. Vaughan Browner on 2/26.  2. Medication Reconciliation  A drug regimen assessment was performed, including review of allergies, interactions, disease-state management, dosing and immunization history. Medications were reviewed with the patient, including name, instructions, indication, goals of therapy, potential side effects, importance of adherence, and safe use.  3. Immunizations  Patient is up to date with annual influenza and Shingrix vaccine.  Recommend Pneumovax 23 and Covid-19 vaccine.  Patient states he received the first COVID-19 vaccine.  He is unsure if he has had Pneumovax 23 and will check with his PCP.  Advised patient that we have Pneumovax available in office and he will be able to get his next follow-up appointment.  Patient verbalized understanding.  All questions encouraged and answered.  Instructed patient to call with any other questions or concerns.  Thank you for allowing pharmacy to participate in this patient's care.  This appointment  required  45 minutes of patient care (this includes precharting, chart review, review of results, face-to-face care, etc.).   Mariella Saa, PharmD, [place], [place] [facility] Pharmacist [phone]  05/22/2019 11:39 AM    .

## 2019-05-21 ENCOUNTER — Other Ambulatory Visit: Payer: Self-pay | Admitting: Cardiovascular Disease

## 2019-05-21 ENCOUNTER — Encounter: Payer: Self-pay | Admitting: *Deleted

## 2019-05-21 ENCOUNTER — Other Ambulatory Visit: Payer: Self-pay | Admitting: Neurology

## 2019-05-21 NOTE — Telephone Encounter (Signed)
This is a patient of Dr. Jannifer Franklin. His last appt was May 2020. He is due for a follow-up. His last refill according to [facility] registry was on 04/18/2019 #60 for 30 day supply written by Dr. Jaynee Eagles in November 2020. Will send request to Dr. Jannifer Franklin to authorize x 1. Sent pt a mychart message asking for call back to schedule a follow-up.

## 2019-05-22 ENCOUNTER — Telehealth (INDEPENDENT_AMBULATORY_CARE_PROVIDER_SITE_OTHER): Payer: PPO | Admitting: Pharmacist

## 2019-05-22 DIAGNOSIS — J849 Interstitial pulmonary disease, unspecified: Secondary | ICD-10-CM | POA: Diagnosis not present

## 2019-05-22 DIAGNOSIS — H25043 Posterior subcapsular polar age-related cataract, bilateral: Secondary | ICD-10-CM | POA: Diagnosis not present

## 2019-05-22 DIAGNOSIS — Z5181 Encounter for therapeutic drug level monitoring: Secondary | ICD-10-CM | POA: Diagnosis not present

## 2019-05-22 DIAGNOSIS — H25013 Cortical age-related cataract, bilateral: Secondary | ICD-10-CM | POA: Diagnosis not present

## 2019-05-22 DIAGNOSIS — H2512 Age-related nuclear cataract, left eye: Secondary | ICD-10-CM | POA: Diagnosis not present

## 2019-05-22 DIAGNOSIS — Z79899 Other long term (current) drug therapy: Secondary | ICD-10-CM | POA: Diagnosis not present

## 2019-05-22 DIAGNOSIS — H04122 Dry eye syndrome of left lacrimal gland: Secondary | ICD-10-CM | POA: Diagnosis not present

## 2019-05-22 DIAGNOSIS — H2513 Age-related nuclear cataract, bilateral: Secondary | ICD-10-CM | POA: Diagnosis not present

## 2019-05-22 DIAGNOSIS — H04123 Dry eye syndrome of bilateral lacrimal glands: Secondary | ICD-10-CM | POA: Diagnosis not present

## 2019-05-22 DIAGNOSIS — H04121 Dry eye syndrome of right lacrimal gland: Secondary | ICD-10-CM | POA: Diagnosis not present

## 2019-05-22 MED ORDER — OFEV 150 MG PO CAPS: 150.0000 mg | ORAL_CAPSULE | Freq: Two times a day (BID) | ORAL | 2 refills | Status: DC

## 2019-05-23 ENCOUNTER — Telehealth: Payer: Self-pay | Admitting: Pulmonary Disease

## 2019-05-23 NOTE — Telephone Encounter (Signed)
Called pt's wife but she didn't answer. LM for her to call back.

## 2019-05-23 NOTE — Telephone Encounter (Signed)
Andre [name] called because Dr. Vaughan Browner prescribed the pt Ofev and he has a history of heart problems. Was wanting to make sure that the medicine wouldn't mess with heart.

## 2019-05-24 ENCOUNTER — Telehealth: Payer: Self-pay | Admitting: Cardiovascular Disease

## 2019-05-24 NOTE — Telephone Encounter (Signed)
Patient wife calling regarding patients newly diagnosed IPF. Pateints wife wants advise on medication "Ofev" that patients pulmonary doctor wants to prescribe.  Please advise

## 2019-05-24 NOTE — Telephone Encounter (Signed)
Called and spoke with pt's wife [name] letting her know the info stated by Dr. Ruben Gottron verbalized understanding and stated she was going to call her cardiologist about the med to see what they thought about pt beginning the med. I stated to [name] that we could either set up a visit with pharmacy staff or see if we could move pt's appt up sooner and she said if she had to, she might contact the pharmacy team that is with the [facility] as pt is not wanting to really go anywhere until after receiving second covid vaccine.   Stated to [name] if pt did not want to be seen sooner and wanted to keep currently scheduled OV on 2/26, pt could hold off on taking med until after having further discussion with Dr. Vaughan Browner on 2/26 and she verbalized understanding and stated she would contact us back if needed anything or if she wanted to move pt's appt up sooner. Stated to her if pt's cardiologist wanted to further discuss the med with Dr. Vaughan Browner, we could arrange for that to be done and she verbalized understanding. Nothing further needed.

## 2019-05-24 NOTE — Telephone Encounter (Addendum)
Message fwd to Dr. Fletcher Anon to give his recommendation regarding the patient starting OFEV ([email or web address]).

## 2019-05-24 NOTE — Telephone Encounter (Signed)
It is generally safe though the company states to use with caution in patients with heart issues. If patient is concerned then we can hold off on starting Ofev and discuss alternate therapies at upcoming clinic visit  Marshell Garfinkel MD [facility] 05/24/2019, 2:14 PM

## 2019-05-24 NOTE — Telephone Encounter (Signed)
Dr, Vaughan Browner, pt has artrial fib and coronary heart disease and the pt's wife wants to know will the OFEV be ok to take? Please advise.

## 2019-05-27 NOTE — Telephone Encounter (Signed)
This medication should be fine from a cardiac standpoint .

## 2019-05-27 NOTE — Telephone Encounter (Signed)
DPR on file with ok to leave a detailed message. lmom with Dr. Tyrell Antonio response. Pt is to call back if any questions.

## 2019-05-28 ENCOUNTER — Telehealth: Payer: Self-pay | Admitting: Pulmonary Disease

## 2019-05-28 NOTE — Telephone Encounter (Signed)
Spoke with the pt and spouse, [name]  She states that pt has been wheezing some over the past 2 days  He has also noticed coughing up some gray colored sputum  No fever- temp today was 96.2  No body aches, chills but has had slight increase in [employer] tomorrow with Beth at 2:30 pm was scheduled and advised to seek emergent care sooner if needed

## 2019-05-29 ENCOUNTER — Encounter: Payer: Self-pay | Admitting: Primary Care

## 2019-05-29 ENCOUNTER — Ambulatory Visit (INDEPENDENT_AMBULATORY_CARE_PROVIDER_SITE_OTHER): Payer: PPO | Admitting: Primary Care

## 2019-05-29 ENCOUNTER — Other Ambulatory Visit: Payer: Self-pay

## 2019-05-29 DIAGNOSIS — J849 Interstitial pulmonary disease, unspecified: Secondary | ICD-10-CM

## 2019-05-29 DIAGNOSIS — R0602 Shortness of breath: Secondary | ICD-10-CM

## 2019-05-29 MED ORDER — FLUTTER DEVI: 1.0000 | Freq: Every day | 0 refills | Status: DC

## 2019-05-29 MED ORDER — PREDNISONE 10 MG PO TABS: ORAL_TABLET | ORAL | 0 refills | Status: DC

## 2019-05-29 MED ORDER — ALBUTEROL SULFATE HFA 108 (90 BASE) MCG/ACT IN AERS: 2.0000 | INHALATION_SPRAY | Freq: Four times a day (QID) | RESPIRATORY_TRACT | 2 refills | Status: DC | PRN

## 2019-05-29 NOTE — Progress Notes (Signed)
Virtual Visit via Telephone Note  I connected with Andre [name] on 05/29/19 at  2:30 PM EST by telephone and verified that I am speaking with the correct person using two identifiers.  Location: Patient: Home Provider: Office   I discussed the limitations, risks, security and privacy concerns of performing an evaluation and management service by telephone and the availability of in person appointments. I also discussed with the patient that there may be a patient responsible charge related to this service. The patient expressed understanding and agreed to proceed.   History of Present Illness: 84 year old male, former smoker quit in 1974. PMH significant for ILD, moderate COPD/emphysema, OSA on CPAP, HF, CAD, HTN, AF, GERD, Barrett's esophagus. Patient of Dr. Vaughan Browner, last seen on 05/03/19. Most recent HRCT in January 2021 showed progression of fibrosis. PFTs showed moderate obstruction. Maintained on Stiolto. Started on Ofev 150mg  BID in February 2021.   Previous [facility] pulmonary encounters: 05/03/19- Follow-up, Dr. Vaughan Browner Last seen in pulmonary clinic in December 2020 with chest x-ray showing right lung pneumonia for which she was treated with antibiotics. Follow-up CT scan shows resolution of pneumonia but shows progression of pulmonary fibrosis.  He is here to discuss results. Continues to have dyspnea on exertion, cough with white mucus. He will benefit from initiation of anti fibrotic. Discussed risk/benefit in detail with patient and wife today and have decided to initiate this.  05/29/2019 Patient contacted today for acute televisit for wheezing. Accompanied by wife on speaker phone. Experiencing increased wheezing and shortness of breath on exertion x 1 week. This started after going to [place]. He has an occasional cough with very little production. Continues using stiolto daily as prescribed; asking for hand held Albuterol rescue inhaler to be able to use when not home. He started OFEV two days  ago, tolerating new medication so far. Denies fever, chills, sweats, chest pain, N/V/D.    Observations/Objective:  Wife mostly did the speak and [name] was in the background telling her what to say   Temp 96.1 BP 122/82 O2 95-96% RA  HR 59  Assessment and Plan:  Pulmonary fibrosis: - HRCT January 2021 showed progression of pulmonary fibrosis, discussed treatment options with Dr. Vaughan Browner - Discussed treatment options with Dr. Vaughan Browner who felt patient would benefit from anti fibrotics, patient/wife agreeing to start medication  - Esbriet denied; Started on OFEV 150mg  BID, took first dose 2/8 and tolerating     COPD exacerbation:  - Shortness of breath and wheezing x 1 week. Rare cough.  - Continue Stiolto two puffs once daily  - RX prednisone taper and albuterol hfa 2 puffs q4-6 hours for sob/wheezing  - Recommending mucinex twice daily for cough/chest congestion  - Adding flutter valve three times a day  - If no improvement recommend covid testing  OSA: - 100% Compliant with CPAP and reports benefit from  - Pressure 12 cm h20; AHI 1.6   Follow Up Instructions:  As needed if symptoms do not improve or worsen    I discussed the assessment and treatment plan with the patient. The patient was provided an opportunity to ask questions and all were answered. The patient agreed with the plan and demonstrated an understanding of the instructions.   The patient was advised to call back or seek an in-person evaluation if the symptoms worsen or if the condition fails to improve as anticipated.  I provided 25 minutes of non-face-to-face time during this encounter.   Martyn Ehrich, NP

## 2019-05-29 NOTE — Patient Instructions (Addendum)
Pulmonary fibrosis: - Continue Ofev, if you develop GI symptoms notify office  COPD exacerbation: - RX prednisone taper as prescribed - Sent in albuterol inhaler, take 2 puffs every 4-6 hours for sob/wheezing  - Recommending mucinex twice daily for cough/chest congestion  - Adding flutter valve, use three times a day for congested cough  OSA: - Continue CPAP every night   Follow Up Instructions: As needed if symptoms do not improve or worsen

## 2019-05-31 ENCOUNTER — Ambulatory Visit: Payer: PPO

## 2019-06-03 ENCOUNTER — Other Ambulatory Visit: Payer: Self-pay | Admitting: Cardiovascular Disease

## 2019-06-03 DIAGNOSIS — J449 Chronic obstructive pulmonary disease, unspecified: Secondary | ICD-10-CM | POA: Diagnosis not present

## 2019-06-03 DIAGNOSIS — G4733 Obstructive sleep apnea (adult) (pediatric): Secondary | ICD-10-CM | POA: Diagnosis not present

## 2019-06-03 DIAGNOSIS — J45909 Unspecified asthma, uncomplicated: Secondary | ICD-10-CM | POA: Diagnosis not present

## 2019-06-03 DIAGNOSIS — J441 Chronic obstructive pulmonary disease with (acute) exacerbation: Secondary | ICD-10-CM | POA: Diagnosis not present

## 2019-06-07 ENCOUNTER — Other Ambulatory Visit: Payer: Self-pay | Admitting: *Deleted

## 2019-06-07 DIAGNOSIS — D751 Secondary polycythemia: Secondary | ICD-10-CM

## 2019-06-10 ENCOUNTER — Inpatient Hospital Stay: Payer: PPO | Attending: Internal Medicine | Admitting: Internal Medicine

## 2019-06-10 ENCOUNTER — Inpatient Hospital Stay: Payer: PPO

## 2019-06-10 ENCOUNTER — Encounter: Payer: Self-pay | Admitting: Internal Medicine

## 2019-06-10 ENCOUNTER — Other Ambulatory Visit: Payer: Self-pay

## 2019-06-10 DIAGNOSIS — Z7901 Long term (current) use of anticoagulants: Secondary | ICD-10-CM | POA: Insufficient documentation

## 2019-06-10 DIAGNOSIS — I480 Paroxysmal atrial fibrillation: Secondary | ICD-10-CM | POA: Diagnosis not present

## 2019-06-10 DIAGNOSIS — D751 Secondary polycythemia: Secondary | ICD-10-CM | POA: Diagnosis not present

## 2019-06-10 DIAGNOSIS — M2550 Pain in unspecified joint: Secondary | ICD-10-CM | POA: Insufficient documentation

## 2019-06-10 DIAGNOSIS — G8929 Other chronic pain: Secondary | ICD-10-CM | POA: Diagnosis not present

## 2019-06-10 DIAGNOSIS — F1010 Alcohol abuse, uncomplicated: Secondary | ICD-10-CM | POA: Insufficient documentation

## 2019-06-10 DIAGNOSIS — Z79899 Other long term (current) drug therapy: Secondary | ICD-10-CM | POA: Insufficient documentation

## 2019-06-10 DIAGNOSIS — I2510 Atherosclerotic heart disease of native coronary artery without angina pectoris: Secondary | ICD-10-CM | POA: Insufficient documentation

## 2019-06-10 DIAGNOSIS — M542 Cervicalgia: Secondary | ICD-10-CM | POA: Diagnosis not present

## 2019-06-10 DIAGNOSIS — D696 Thrombocytopenia, unspecified: Secondary | ICD-10-CM | POA: Diagnosis not present

## 2019-06-10 LAB — COMPREHENSIVE METABOLIC PANEL
ALT: 6 U/L (ref 0–44)
AST: 17 U/L (ref 15–41)
Albumin: 3.4 g/dL — ABNORMAL LOW (ref 3.5–5.0)
Alkaline Phosphatase: 76 U/L (ref 38–126)
Anion gap: 10 (ref 5–15)
BUN: 17 mg/dL (ref 8–23)
CO2: 25 mmol/L (ref 22–32)
Calcium: 8.2 mg/dL — ABNORMAL LOW (ref 8.9–10.3)
Chloride: 102 mmol/L (ref 98–111)
Creatinine, Ser: 0.95 mg/dL (ref 0.61–1.24)
GFR calc Af Amer: >60 mL/min (ref >60)
GFR calc non Af Amer: >60 mL/min (ref >60)
Glucose, Bld: 120 mg/dL — ABNORMAL HIGH (ref 70–99)
Potassium: 4.1 mmol/L (ref 3.5–5.1)
Sodium: 137 mmol/L (ref 135–145)
Total Bilirubin: 1 mg/dL (ref 0.3–1.2)
Total Protein: 6.2 g/dL — ABNORMAL LOW (ref 6.5–8.1)

## 2019-06-10 LAB — CBC WITH DIFFERENTIAL/PLATELET
Abs Immature Granulocytes: 0.05 10*3/uL (ref 0.00–0.07)
Basophils Absolute: 0.1 10*3/uL (ref 0.0–0.1)
Basophils Relative: 1 %
Eosinophils Absolute: 0.1 10*3/uL (ref 0.0–0.5)
Eosinophils Relative: 1 %
HCT: 44.9 % (ref 39.0–52.0)
Hemoglobin: 14.2 g/dL (ref 13.0–17.0)
Immature Granulocytes: 1 %
Lymphocytes Relative: 18 %
Lymphs Abs: 1.9 10*3/uL (ref 0.7–4.0)
MCH: 29.3 pg (ref 26.0–34.0)
MCHC: 31.6 g/dL (ref 30.0–36.0)
MCV: 92.6 fL (ref 80.0–100.0)
Monocytes Absolute: 0.9 10*3/uL (ref 0.1–1.0)
Monocytes Relative: 8 %
Neutro Abs: 7.7 10*3/uL (ref 1.7–7.7)
Neutrophils Relative %: 71 %
Platelets: 119 10*3/uL — ABNORMAL LOW (ref 150–400)
RBC: 4.85 MIL/uL (ref 4.22–5.81)
RDW: 14.6 % (ref 11.5–15.5)
Smear Review: DECREASED
WBC: 10.7 10*3/uL — ABNORMAL HIGH (ref 4.0–10.5)
nRBC: 0 % (ref 0.0–0.2)

## 2019-06-10 NOTE — Assessment & Plan Note (Addendum)
#   Mild thrombocytopenia platelets-119;   question secondary to alcohol versus liver disease.vs ITP.  Stable.  Monitor for now.  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 STABLE; HCT -44;  HOLD phlebotomy.   # Chronic neck pain/ joint pain- on alcohol;  STABLE; Recommend moderation.   # CAD/-stenting [Dr.Arida] A. Fib- on Eliquis per cardiology/ see above. STABLE.   # spoke to his wife; the above plan of care.  # DISPOSITION:  # NO Phlebotomy today # Follow up in  6 months-MD labs- cbc/cmp- Dr.B  Cc; Dr.Sonnenberg

## 2019-06-10 NOTE — Progress Notes (Signed)
[facility] OFFICE PROGRESS NOTE  Patient Care Team: [name], MD as PCP - General (Family Medicine) Wellington Hampshire, MD as PCP - Cardiology (Cardiology)   SUMMARY OF ONCOLOGIC HISTORY: # Mild Thrombocytopenia- 120-130s. ? Alcohol/fatty liver/? 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  INTERVAL HISTORY:  84 year old male patient with above history of secondary erythrocytosis from prior testosterone therapy/CPAP; intermittent thrombocytopenia and history of A. fib on Elquis here for follow-up.  Patient interim has been diagnosed with interstitial lung disease/pulmonary fibrosis.  He seems to be doing fairly well from pulmonary standpoint.  Denies any worsening shortness of breath or cough.  Denies any strokes no falls.    Review of Systems  Constitutional: Negative for chills, diaphoresis, fever, malaise/fatigue and weight loss.  HENT: Negative for nosebleeds and sore throat.   Eyes: Negative for double vision.  Respiratory: Negative for cough, hemoptysis, sputum production, shortness of breath and wheezing.   Cardiovascular: Negative for chest pain, palpitations, orthopnea and leg swelling.  Gastrointestinal: Negative for abdominal pain, blood in stool, constipation, diarrhea, heartburn, melena, nausea and vomiting.  Genitourinary: Negative for dysuria, frequency and urgency.  Musculoskeletal: Positive for back pain and joint pain.  Skin: Negative.  Negative for itching and rash.  Neurological: Negative for dizziness, tingling, focal weakness, weakness and headaches.  Endo/Heme/Allergies: Does not bruise/bleed easily.  Psychiatric/Behavioral:  Negative for depression. The patient is not nervous/anxious and does not have insomnia.      PAST MEDICAL HISTORY :  Past Medical History:  Diagnosis Date  . Atherosclerosis of abdominal aorta ([place])   . CAD (coronary artery disease)   . Cervical spondylosis 10/01/2013  . Chronic diastolic CHF (congestive heart failure) ([place])    a. 07/2016 Echo: >55%; b. 10/2016 Echo: EF 55-60%, Gr1 DD, Ao sclerosis w/o stenosis, sev dil LA; c. 08/2017 Echo: EF 60-65%, no rwma, Gr2 DD, mild AS, sev dil LA/RA.  [name] Coronary artery disease    a. 1998 s/p mini-cabg @ [facility] - LIMA->LAD;  b. 07/2016 St Echo: Inadequate HR w/ HTN response;  c.  08/2016 MV: EF 67%, no ischemia; d. 10/2016 NSTEMI/Cath: RCA 95p (4.0x26 Onyx DES), LIMA->LAD nl; e. 09/2017 Cath: LM 40/30, LAD 100ost, RI 80, LCX nl, OM2/3 nl, RCA patent stent, 67m, LIMA->LAD nl-->Med Rx.  . DDD (degenerative disc disease), cervical   . DDD (degenerative disc disease), lumbar   . Depression   . GERD (gastroesophageal reflux disease)   . Hyperlipidemia   . Hypertension   . Hypothyroidism   . PAF (paroxysmal atrial fibrillation) ([facility])    a. s/p DCCV-->maintaining sinus on amiodarone;  b. CHA2DS2VASc = 5-->eliquis.  . Parkinson's disease ([place])    tremors  . Pleural effusion, right    a. 09/2017 s/p thoracentesis.  [name] PNA (pneumonia) 08/26/2017  . Pulmonary embolism ([place]) 2011  . Pulmonary fibrosis ([place])   . 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 Sleep apnea    wears CPAP  . Thrombocytopenia ([place])     PAST SURGICAL HISTORY :   Past Surgical History:  Procedure Laterality Date  . BACK SURGERY  1960  . CARDIAC CATHETERIZATION    . CHOLECYSTECTOMY  2010  . COLONOSCOPY WITH PROPOFOL N/A 06/07/2018   Procedure: COLONOSCOPY WITH PROPOFOL;  Surgeon: Lollie Sails, MD;  [facility] END[facility]COPY;  Service: Endoscopy;  Laterality: N/A;  . CORONARY ARTERY BYPASS GRAFT  01/07/1997  . CORONARY STENT INTERVENTION N/A 10/31/2016   Procedure: Coronary Stent  Intervention;  Surgeon: Wellington Hampshire, MD;  Location: [facility] CV LAB;  Service: Cardiovascular;  Laterality: N/A;  . ELECTROPHYSIOLOGIC STUDY N/A 07/14/2015   Procedure: CARDIOVERSION;  Surgeon: Yolonda Kida, MD;  Location: [facility] ORS;  Service: Cardiovascular;  Laterality: N/A;  . ELECTROPHYSIOLOGIC STUDY N/A 10/12/2015   Procedure: CARDIOVERSION;  Surgeon: Minna Merritts, MD;  Location: [facility] ORS;  Service: Cardiovascular;  Laterality: N/A;  . LEFT HEART CATH AND CORONARY ANGIOGRAPHY N/A 10/31/2016   Procedure: Left Heart Cath and Coronary Angiography;  Surgeon: Wellington Hampshire, MD;  Location: [facility] LAB;  Service: Cardiovascular;  Laterality: N/A;  . OTHER SURGICAL HISTORY  1998   Bypass  . RIGHT/LEFT HEART CATH AND CORONARY ANGIOGRAPHY N/A 09/18/2017   Procedure: RIGHT/LEFT HEART CATH AND CORONARY ANGIOGRAPHY;  Surgeon: Wellington Hampshire, MD;  Location: [facility];  Service: Cardiovascular;  Laterality: N/A;    FAMILY HISTORY :   Family History  Problem Relation Age of Onset  . Alcohol abuse Father     SOCIAL HISTORY:   Social History   Tobacco Use  . Smoking status: Former Smoker    Packs/day: 1.00    Years: 10.00    Pack years: 10.00    Types: Cigarettes, Pipe, Cigars    Quit date: 04/18/1972    Years since quitting: 47.1  . Smokeless tobacco: Former Systems developer    Types: Chew    Quit date: 04/18/1972  Substance Use Topics  . Alcohol use: Yes    Alcohol/week: 4.0 standard drinks    Types: 2 Cans of beer, 2 Shots of liquor per week    Comment: per 2 weeks   . Drug use: No    ALLERGIES:  is allergic to pravastatin and prednisone.  MEDICATIONS:  Current Outpatient Medications  Medication Sig Dispense Refill  . albuterol (PROVENTIL) (2.5 MG/3ML) 0.083% nebulizer solution USE ONE AMPULE(3ML) VIA NEBULIZER EVERY 4 HOURS AS NEEDED FOR WHEEZING OR SHORTNESS OF BREATH 375 mL 11  . albuterol (VENTOLIN HFA) 108 (90 Base) MCG/ACT inhaler Inhale 2 puffs  into the lungs every 6 (six) hours as needed for wheezing or shortness of breath. 8 g 2  . buPROPion (WELLBUTRIN XL) 150 MG 24 hr tablet Take 1 tablet (150 mg total) by mouth daily. 90 tablet 1  . carbidopa-levodopa (SINEMET IR) 25-250 MG tablet TAKE TWO TABLETS 3 TIMES DAILY 540 tablet 3  . carvedilol (COREG) 3.125 MG tablet TAKE TWO TABLETS TWICE A DAY WITH MEALS 120 tablet 3  . ELIQUIS 5 MG TABS tablet TAKE ONE TABLET BY MOUTH TWICE DAILY 60 tablet 5  . entacapone (COMTAN) 200 MG tablet Take 1 tablet (200 mg total) by mouth 3 (three) times daily. 270 tablet 2  . escitalopram (LEXAPRO) 20 MG tablet TAKE ONE TABLET EVERY DAY (Patient taking differently: Take 10 mg by mouth daily. ) 90 tablet 1  . esomeprazole (NEXIUM) 20 MG capsule Take 48 mg by mouth daily at 12 noon.     . fluticasone (FLONASE) 50 MCG/ACT nasal spray TAKE 2 PUFFS IN EACH NOSTRIL DAILY 16 g 3  . furosemide (LASIX) 20 MG tablet TAKE TWO TABLETS TWICE A DAY 120 tablet 0  . HYDROcodone-acetaminophen (NORCO/VICODIN) 5-325 MG tablet Take by mouth.    . isosorbide mononitrate (  IMDUR) 30 MG 24 hr tablet TAKE ONE TABLET BY MOUTH EVERY DAY 30 tablet 5  . ketoconazole (NIZORAL) 2 % cream     . levothyroxine (SYNTHROID) 50 MCG tablet TAKE ONE TABLET ON AN EMPTY STOMACH WITHA GLASS OF WATER AT LEAST 30 TO 60 MINUTES BEFORE BREAKFAST 90 tablet 1  . losartan (COZAAR) 50 MG tablet Take 1 tablet (50 mg total) by mouth daily. 90 tablet 2  . modafinil (PROVIGIL) 200 MG tablet TAKE 1 TABLET BY MOUTH TWICE DAILY 180 tablet 0  . mometasone (ELOCON) 0.1 % cream     . MYRBETRIQ 50 MG TB24 tablet TAKE ONE TABLET EVERY DAY 30 tablet 11  . Nintedanib (OFEV) 150 MG CAPS Take 1 capsule (150 mg total) by mouth 2 (two) times daily. 60 capsule 2  . Polyethyl Glycol-Propyl Glycol (SYSTANE OP) Place 1 drop into both eyes 2 (two) times daily as needed (dry eyes).    . potassium chloride SA (KLOR-CON) 20 MEQ tablet TAKE ONE TABLET EVERY DAY 90 tablet 0  .  predniSONE (DELTASONE) 10 MG tablet Take 4 tabs po daily x 2 days; then 3 tabs for 2 days; then 2 tabs for 2 days; then 1 tab for 2 days 20 tablet 0  . Respiratory Therapy Supplies (FLUTTER) DEVI 1 Device by Does not apply route daily. 1 each 0  . rosuvastatin (CRESTOR) 10 MG tablet TAKE 1 TABLET BY MOUTH DAILY 90 tablet 0  . Tiotropium Bromide-Olodaterol (STIOLTO RESPIMAT) 2.5-2.5 MCG/ACT AERS Inhale 2 puffs into the lungs daily. 1 g 6   No current facility-administered medications for this visit.    PHYSICAL EXAMINATION:   BP (!) 154/88 (BP Location: Left Arm, Patient Position: Sitting, Cuff Size: Normal)   Pulse 69   Temp (!) 95.3 F (35.2 C) (Tympanic)   Wt 266 lb (120.7 kg)   BMI 40.45 kg/m   Filed Weights   06/10/19 1418  Weight: 266 lb (120.7 kg)    Physical Exam  Constitutional: He is oriented to person, place, and time and well-developed, well-nourished, and in no distress.  Accompanied by his wife.  HENT:  Head: Normocephalic and atraumatic.  Mouth/Throat: Oropharynx is clear and moist. No oropharyngeal exudate.  Eyes: Pupils are equal, round, and reactive to light.  Cardiovascular: Normal rate and regular rhythm.  Pulmonary/Chest: No respiratory distress. He has no wheezes.  Abdominal: Soft. Bowel sounds are normal. He exhibits no distension and no mass. There is no abdominal tenderness. There is no rebound and no guarding.  Musculoskeletal:        General: No tenderness or edema. Normal range of motion.     Cervical back: Normal range of motion and neck supple.  Neurological: He is alert and oriented to person, place, and time.  Skin: Skin is warm.  Psychiatric: Affect normal.     LABORATORY DATA:  I have reviewed the data as listed    Component Value Date/Time   NA 137 06/10/2019 1350   NA 139 10/09/2017 1441   K 4.1 06/10/2019 1350   CL 102 06/10/2019 1350   CO2 25 06/10/2019 1350   GLUCOSE 120 (H) 06/10/2019 1350   BUN 17 06/10/2019 1350   BUN 23  10/09/2017 1441   CREATININE 0.95 06/10/2019 1350   CREATININE 0.95 12/06/2011 1554   CALCIUM 8.2 (L) 06/10/2019 1350   PROT 6.2 (L) 06/10/2019 1350   PROT 7.0 12/03/2015 0842   PROT 7.6 09/20/2011 1529   ALBUMIN 3.4 (L) 06/10/2019 1350  ALBUMIN 4.2 12/03/2015 0842   ALBUMIN 3.7 09/20/2011 1529   AST 17 06/10/2019 1350   AST 18 09/20/2011 1529   ALT 6 06/10/2019 1350   ALT 23 09/20/2011 1529   ALKPHOS 76 06/10/2019 1350   ALKPHOS 133 09/20/2011 1529   BILITOT 1.0 06/10/2019 1350   BILITOT 0.8 12/03/2015 0842   BILITOT 0.5 09/20/2011 1529   GFRNONAA >60 06/10/2019 1350   GFRNONAA >60 12/06/2011 1554   GFRAA >60 06/10/2019 1350   GFRAA >60 12/06/2011 1554    No results found for: SPEP, UPEP  Lab Results  Component Value Date   WBC 10.7 (H) 06/10/2019   NEUTROABS 7.7 06/10/2019   HGB 14.2 06/10/2019   HCT 44.9 06/10/2019   MCV 92.6 06/10/2019   PLT 119 (L) 06/10/2019      Chemistry      Component Value Date/Time   NA 137 06/10/2019 1350   NA 139 10/09/2017 1441   K 4.1 06/10/2019 1350   CL 102 06/10/2019 1350   CO2 25 06/10/2019 1350   BUN 17 06/10/2019 1350   BUN 23 10/09/2017 1441   CREATININE 0.95 06/10/2019 1350   CREATININE 0.95 12/06/2011 1554      Component Value Date/Time   CALCIUM 8.2 (L) 06/10/2019 1350   ALKPHOS 76 06/10/2019 1350   ALKPHOS 133 09/20/2011 1529   AST 17 06/10/2019 1350   AST 18 09/20/2011 1529   ALT 6 06/10/2019 1350   ALT 23 09/20/2011 1529   BILITOT 1.0 06/10/2019 1350   BILITOT 0.8 12/03/2015 0842   BILITOT 0.5 09/20/2011 1529        ASSESSMENT & PLAN:   Thrombocytopenia ([place]) # Mild thrombocytopenia platelets-119;   question secondary to alcohol versus liver disease.vs ITP.  Stable.  Monitor for now.  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 STABLE; HCT -44;  HOLD phlebotomy.   # Chronic neck pain/ joint pain- on alcohol;  STABLE;  Recommend moderation.   # CAD/-stenting [Dr.Arida] A. Fib- on Eliquis per cardiology/ see above. STABLE.   # spoke to his wife; the above plan of care.  # DISPOSITION:  # NO Phlebotomy today # Follow up in  6 months-MD labs- cbc/cmp- Dr.B  Cc; Dr.Sonnenberg     Cammie Sickle, MD 06/10/2019 3:08 PM

## 2019-06-10 NOTE — Assessment & Plan Note (Deleted)
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  Stable  # Today hemoglobin is 13; HCT -46;  HOLD phlebotomy.   # Mild thrombocytopenia platelets 137;   question secondary to alcohol versus liver disease.vs ITP. monitor stable  # Chronic neck pain/ joint pain- on alcohol; stable recommend moderation.   # CAD/-stenting [Dr.Arida] A. Fib- on Eliquis per cardiology/ see above.  Stable  # spoke to his wife; the above plan of care.  # DISPOSITION:  # NO Phlebotomy today # Follow up in  6 months-MD labs- cbc/cmp- Dr.B  Cc; Dr.Sonnenberg

## 2019-06-14 ENCOUNTER — Other Ambulatory Visit: Payer: Self-pay

## 2019-06-14 ENCOUNTER — Encounter: Payer: Self-pay | Admitting: Pulmonary Disease

## 2019-06-14 ENCOUNTER — Other Ambulatory Visit: Payer: Self-pay | Admitting: Family Medicine

## 2019-06-14 ENCOUNTER — Ambulatory Visit: Payer: PPO | Admitting: Pulmonary Disease

## 2019-06-14 VITALS — BP 118/72 | HR 65 | Temp 97.4°F | Ht 67.0 in | Wt 262.8 lb

## 2019-06-14 DIAGNOSIS — Z5181 Encounter for therapeutic drug level monitoring: Secondary | ICD-10-CM | POA: Diagnosis not present

## 2019-06-14 DIAGNOSIS — J849 Interstitial pulmonary disease, unspecified: Secondary | ICD-10-CM

## 2019-06-14 DIAGNOSIS — G4733 Obstructive sleep apnea (adult) (pediatric): Secondary | ICD-10-CM | POA: Diagnosis not present

## 2019-06-14 DIAGNOSIS — F32A Depression, unspecified: Secondary | ICD-10-CM

## 2019-06-14 DIAGNOSIS — J449 Chronic obstructive pulmonary disease, unspecified: Secondary | ICD-10-CM | POA: Diagnosis not present

## 2019-06-14 DIAGNOSIS — F329 Major depressive disorder, single episode, unspecified: Secondary | ICD-10-CM

## 2019-06-14 DIAGNOSIS — F419 Anxiety disorder, unspecified: Secondary | ICD-10-CM

## 2019-06-14 NOTE — Patient Instructions (Signed)
Am glad you are doing well and tolerating the Ofev Your recent lung function tests are normal Continue to work on weight loss with diet and exercise  Follow-up in 1 month with me or nurse practitioner

## 2019-06-14 NOTE — Progress Notes (Signed)
Andre LYMON    [id number]    10/28/1935  Primary Care Physician:Sonnenberg, Angela Adam, MD  Referring Physician: [name], MD [address] [place],  [place] [address]  Chief complaint: Follow-up for COPD, asbestos related pulmonary fibrosis, sleep apnea.  HPI: 84 year old with history of pulmonary fibrosis, recurrent respiratory failure, eosinophilic pleural effusion, chronic diastolic heart failure, sleep apnea, Parkinson's,  Previously followed [facility].  He was also evaluated at [facility] for pulmonary fibrosis thought to be secondary to prior asbestos exposure.  Recommendation was made to follow-up with a CT in January 2020.  However he cannot keep the appointment due to high co-pay and wishes to transfer care to [facility].  He has had at least 3 admissions over the past year for recurrent respiratory failure, HCAP, right pleural effusion status post thoracentesis on 09/19/2017 with cell count showing exudate with elevated eosinophils.   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  Has run 38 marathons in the past.  After his CABG he took up long distance cycling which he had to give up about 10 years ago.  Initially on Anoro.  This was changed to [facility] in May 2020 as he did not achieve adequate inspiratory flow.   Pets: No pets Occupation: Worked as a Actor ILD questionnaire 05/15/2018: Exposure to asbestos, no other significant exposure. Smoking history:20-pack-year smoker.  Quit smoking in 1985 Travel history: No significant recent travel Relevant family history: No family history of lung disease.  Interim History: Treated with antibiotics in December 2020 with chest x-ray showing right lung pneumonia for which she was treated with antibiotics Follow-up CT scan shows resolution of pneumonia but shows progression of pulmonary fibrosis.   Started on Ofev 05/26/18.  He has been taking this medication for 2  weeks Tolerating it well with no GI symptoms  Had a COPD exacerbation with nurse practitioner visit on 2/10 for which he got Pred taper.  Started on flutter valve Overall he feels improved compared to before increased exercise capacity.  Still has some cough with congestion  Outpatient Encounter Medications as of 06/14/2019  Medication Sig  . albuterol (PROVENTIL) (2.5 MG/3ML) 0.083% nebulizer solution USE ONE AMPULE(3ML) VIA NEBULIZER EVERY 4 HOURS AS NEEDED FOR WHEEZING OR SHORTNESS OF BREATH  . albuterol (VENTOLIN HFA) 108 (90 Base) MCG/ACT inhaler Inhale 2 puffs into the lungs every 6 (six) hours as needed for wheezing or shortness of breath.  [name] buPROPion (WELLBUTRIN XL) 150 MG 24 hr tablet TAKE 1 TABLET BY MOUTH DAILY  . carbidopa-levodopa (SINEMET IR) 25-250 MG tablet TAKE TWO TABLETS 3 TIMES DAILY  . carvedilol (COREG) 3.125 MG tablet TAKE TWO TABLETS TWICE A DAY WITH MEALS  . ELIQUIS 5 MG TABS tablet TAKE ONE TABLET BY MOUTH TWICE DAILY  . entacapone (COMTAN) 200 MG tablet Take 1 tablet (200 mg total) by mouth 3 (three) times daily.  [name] escitalopram (LEXAPRO) 20 MG tablet TAKE ONE TABLET EVERY DAY (Patient taking differently: Take 10 mg by mouth daily. )  . esomeprazole (NEXIUM) 20 MG capsule Take 48 mg by mouth daily at 12 noon.   . fluticasone (FLONASE) 50 MCG/ACT nasal spray TAKE 2 PUFFS IN EACH NOSTRIL DAILY  . furosemide (LASIX) 20 MG tablet TAKE TWO TABLETS TWICE A DAY  . HYDROcodone-acetaminophen (NORCO/VICODIN) 5-325 MG tablet Take by mouth.  . isosorbide mononitrate (IMDUR) 30 MG 24 hr tablet TAKE ONE TABLET BY MOUTH  EVERY DAY  . ketoconazole (NIZORAL) 2 % cream   . levothyroxine (SYNTHROID) 50 MCG tablet TAKE ONE TABLET ON AN EMPTY STOMACH WITHA GLASS OF WATER AT LEAST 30 TO 60 MINUTES BEFORE BREAKFAST  . losartan (COZAAR) 50 MG tablet Take 1 tablet (50 mg total) by mouth daily.  . modafinil (PROVIGIL) 200 MG tablet TAKE 1 TABLET BY MOUTH TWICE DAILY  . mometasone (ELOCON)  0.1 % cream   . MYRBETRIQ 50 MG TB24 tablet TAKE ONE TABLET EVERY DAY  . Nintedanib (OFEV) 150 MG CAPS Take 1 capsule (150 mg total) by mouth 2 (two) times daily.  Vladimir Faster Glycol-Propyl Glycol (SYSTANE OP) Place 1 drop into both eyes 2 (two) times daily as needed (dry eyes).  . potassium chloride SA (KLOR-CON) 20 MEQ tablet TAKE ONE TABLET EVERY DAY  . Respiratory Therapy Supplies (FLUTTER) DEVI 1 Device by Does not apply route daily.  . rosuvastatin (CRESTOR) 10 MG tablet TAKE 1 TABLET BY MOUTH DAILY  . Tiotropium Bromide-Olodaterol (STIOLTO RESPIMAT) 2.5-2.5 MCG/ACT AERS Inhale 2 puffs into the lungs daily.  . [DISCONTINUED] predniSONE (DELTASONE) 10 MG tablet Take 4 tabs po daily x 2 days; then 3 tabs for 2 days; then 2 tabs for 2 days; then 1 tab for 2 days   No facility-administered encounter medications on file as of 06/14/2019.   Physical Exam: Blood pressure 118/72, pulse 65, temperature (!) 97.4 F (36.3 C), temperature source Temporal, height 5\' 7"  (1.702 m), weight 262 lb 12.8 oz (119.2 kg), SpO2 98 %. Gen:      No acute distress HEENT:  EOMI, sclera anicteric Neck:     No masses; no thyromegaly Lungs:    Clear to auscultation bilaterally; normal respiratory effort CV:         Regular rate and rhythm; no murmurs Abd:      + bowel sounds; soft, non-tender; no palpable masses, no distension Ext:    No edema; adequate peripheral perfusion Skin:      Warm and dry; no rash Neuro: alert and oriented x 3 Psych: normal mood and affect  Data Reviewed: Imaging: CT chest 12/06/2011- peripheral and basal fibrotic changes, calcified pleural plaques.  CT chest 06/09/2015- peripheral and basal fibrotic changes with no honeycombing.  Calcified pleural plaques  CT high-resolution 05/30/2017- moderate centrilobular emphysema, mild basilar subpleural reticulation, groundglass and traction bronchiectasis.  No honeycombing.  Calcified granuloma in the right upper lobe, calcified pleural  plaques.  CT high-resolution 06/08/2018- moderate emphysema, mild basilar reticulation, traction bronchiectasis.  No honeycombing, calcified pleural plaques with right pleural thickening.  Stable calcified granuloma.  I have reviewed the images personally. I have reviewed the images personally.  CT chest high-resolution [[facility]] 01/22/2018 1. Primarily basilar peripheral interlobular septal thickening. Findings may represent changes of chronic edema vs early fibrosis. New small right and trace left pleural effusions with associated calcified pleural plaques. Correlate with history of asbestos exposure.  2. Apparent increased thickening of the right basilar pleura, incompletely assessed. Consider PET/CT or pleural fluid sampling for further evaluation if not already obtained. Differential considerations include fibrosis or malignancy such as mesothelioma. 3. Aortic valve calcifications.  4. Scattered less than 3 mm pulmonary nodules. If the patient is low risk for lung cancer, no further follow-up is recommended. If the patient is high risk for lung cancer, consider 12 month follow-up CT. (2017 Fleischner Guidelines)  PFTs: 09/25/2012 FVC 2.52 [69%], FEV1 1.95 [75%), F/F 77, TLC 72%, DLCO 70% Moderate obstructive airways, minimal restriction and diffusion  defect.  06/12/2018 FVC 2.38 [16%], FEV1 1.66 [66%], F/F 70, TLC 3.95 (61%), DLCO 16.34 [73%] Moderate obstruction with restriction and minimal diffusion defect  Labs: ANA 01/22/2018- negative, CCP-negative  Pleural fluid 09/19/2017- LDH 462, total protein 3.9 WBC 3661, 59% eos, 27% lymphs, 10% neutrophils Cytology-mixed reactive and inflammatory cells.  No malignancy.  Hepatic panel 2/22-stable  Cardiac Echocardiogram 08/28/2017- LVEF [id number], grade 2 diastolic dysfunction, mild aortic stenosis.  Severe dilatation of left atrium and right atrium, trivial pericardial effusion.  Cardiac catheterization 09/18/2017 Significant  three-vessel coronary artery disease with patent grafts.  Right heart catheterization shows mildly elevated filling pressures, minimal pulmonary hypertension Wedge 12, PA pressure 39/17 (24), cardiac output 5.08 L/min PVR 189, 2.4 Wood units  Sleep CPAP titration 11/04/2015- CPAP titrated to 12 cm of water.  Download 05/14/2018 Set pressure 12 cm of water 100% usage greater than 4 hours Residual AHI 1.2  Download 03/24/2019 Set pressure of 12 cm 100% usage greater than 4 hours, residual AHI 1.8  Assessment:  Pulmonary fibrosis, asbestosis CT scan reviewed with basilar fibrosis, pleural calcifications consistent with asbestos exposure Due to progression of pulmonary fibrosis he has been initiated on Ofev Recent LFTs are okay  Dyspnea is multifactorial from ILD, obesity, diastolic heart failure, coronary artery disease Suspect anxiety is a significant component to his episodes of dyspnea Will benefit from pulmonary rehab but he will like to hold off until the Covid pandemic has improved  Emphysema COPD PFTs reviewed with moderate obstruction.   Was initially on Anoro.  This was changed to Stiolto Stable after recent exacerbation Continue flutter valve for mucociliary clearance  Right pleural effusion exudative, eosinophilic S.p thoracentesis dring hospitalization in 2019.  The cell count is eosinophilic which could be from pneumonia versus asbestos-related effusion Follow-up CT shows thickening of the pleura in that area We will need to monitor this area for possible mesothelioma.   Obstructive sleep apnea Stable on CPAP Download reviewed with good compliance and effectiveness.    Health maintenance 12/11/2018 -influenza 05/10/2016-Prevnar  This appointment required 45 minutes of patient care (this includes precharting, chart review, review of results, face-to-face care, etc.).  Plan/Recommendations: Continue Ofev, CPAP Stiolto, flutter valve Follow-up in 1 month for  symptom check and LFTs.  Marshell Garfinkel MD [facility] 06/14/2019, 1:55 PM  CC: [name], MD

## 2019-06-18 ENCOUNTER — Other Ambulatory Visit: Payer: Self-pay | Admitting: Gastroenterology

## 2019-06-18 DIAGNOSIS — K2270 Barrett's esophagus without dysplasia: Secondary | ICD-10-CM

## 2019-06-19 ENCOUNTER — Ambulatory Visit: Payer: PPO | Admitting: Family Medicine

## 2019-06-19 DIAGNOSIS — H2513 Age-related nuclear cataract, bilateral: Secondary | ICD-10-CM | POA: Diagnosis not present

## 2019-06-21 ENCOUNTER — Telehealth: Payer: Self-pay | Admitting: Neurology

## 2019-06-21 NOTE — Telephone Encounter (Signed)
Pt's wife [name] on [place] called wanting to speak to the provider about the pt's Parkinson's and how it is getting worse. Please advise.

## 2019-06-24 NOTE — Telephone Encounter (Signed)
Pt wife called back and pt was schedule next avail with NP pt wife states she would prefer to speak to RN as there was things she wanted to touch base on

## 2019-06-24 NOTE — Telephone Encounter (Signed)
If pts wife please ask her to give details of how pt is getting worse. Pt was last seen 08/2018 and does not have a follow up apt. Please schedule pt at next available appt.   Left vm for pts wife.

## 2019-06-24 NOTE — Telephone Encounter (Addendum)
I called pts wife about her concerns for pts parkinson disease.Pt is having more tremors, and losing balance at times. Pts wife wanted to see a neurologist who may have sooner appt. I advise pts wife for Dr. Jannifer Franklin the soonest was APril 2021. The wife stated her husband did not want to see NP.I schedule pt with Dr. Jannifer Franklin in APril at 48.

## 2019-06-24 NOTE — Telephone Encounter (Signed)
Pt wife called to cancel apt with NP states I\ pt would like to see a MD and would like to know if he can see Dr.Dohemeier for future care or another neurologist that specialize in parkinson's.

## 2019-06-25 ENCOUNTER — Ambulatory Visit
Admission: RE | Admit: 2019-06-25 | Discharge: 2019-06-25 | Disposition: A | Discharge status: Home / Self Care | Payer: PPO | Source: Ambulatory Visit | Attending: Gastroenterology | Admitting: Gastroenterology

## 2019-06-25 ENCOUNTER — Other Ambulatory Visit: Payer: Self-pay

## 2019-06-25 DIAGNOSIS — K2270 Barrett's esophagus without dysplasia: Secondary | ICD-10-CM | POA: Insufficient documentation

## 2019-06-26 DIAGNOSIS — L578 Other skin changes due to chronic exposure to nonionizing radiation: Secondary | ICD-10-CM | POA: Diagnosis not present

## 2019-06-26 DIAGNOSIS — L570 Actinic keratosis: Secondary | ICD-10-CM | POA: Diagnosis not present

## 2019-06-27 ENCOUNTER — Other Ambulatory Visit: Payer: Self-pay

## 2019-06-27 MED ORDER — ENTACAPONE 200 MG PO TABS: 200.0000 mg | ORAL_TABLET | Freq: Three times a day (TID) | ORAL | 3 refills | Status: DC

## 2019-07-04 ENCOUNTER — Ambulatory Visit: Payer: PPO | Admitting: Neurology

## 2019-07-05 ENCOUNTER — Other Ambulatory Visit: Payer: Self-pay | Admitting: Cardiovascular Disease

## 2019-07-05 ENCOUNTER — Ambulatory Visit (INDEPENDENT_AMBULATORY_CARE_PROVIDER_SITE_OTHER): Payer: PPO | Admitting: Family Medicine

## 2019-07-05 ENCOUNTER — Other Ambulatory Visit: Payer: Self-pay

## 2019-07-05 ENCOUNTER — Encounter: Payer: Self-pay | Admitting: Family Medicine

## 2019-07-05 DIAGNOSIS — I10 Essential (primary) hypertension: Secondary | ICD-10-CM | POA: Diagnosis not present

## 2019-07-05 DIAGNOSIS — F419 Anxiety disorder, unspecified: Secondary | ICD-10-CM | POA: Diagnosis not present

## 2019-07-05 DIAGNOSIS — M791 Myalgia, unspecified site: Secondary | ICD-10-CM | POA: Diagnosis not present

## 2019-07-05 DIAGNOSIS — J8410 Pulmonary fibrosis, unspecified: Secondary | ICD-10-CM | POA: Insufficient documentation

## 2019-07-05 DIAGNOSIS — F329 Major depressive disorder, single episode, unspecified: Secondary | ICD-10-CM

## 2019-07-05 DIAGNOSIS — F32A Depression, unspecified: Secondary | ICD-10-CM

## 2019-07-05 DIAGNOSIS — J45909 Unspecified asthma, uncomplicated: Secondary | ICD-10-CM | POA: Diagnosis not present

## 2019-07-05 DIAGNOSIS — J441 Chronic obstructive pulmonary disease with (acute) exacerbation: Secondary | ICD-10-CM | POA: Diagnosis not present

## 2019-07-05 DIAGNOSIS — J449 Chronic obstructive pulmonary disease, unspecified: Secondary | ICD-10-CM | POA: Diagnosis not present

## 2019-07-05 DIAGNOSIS — G4733 Obstructive sleep apnea (adult) (pediatric): Secondary | ICD-10-CM | POA: Diagnosis not present

## 2019-07-05 MED ORDER — LOSARTAN POTASSIUM 100 MG PO TABS: 100.0000 mg | ORAL_TABLET | Freq: Every day | ORAL | 1 refills | Status: DC

## 2019-07-05 MED ORDER — ESCITALOPRAM OXALATE 20 MG PO TABS: 20.0000 mg | ORAL_TABLET | Freq: Every day | ORAL | 1 refills | Status: DC

## 2019-07-05 NOTE — Assessment & Plan Note (Signed)
Likely due to strain on muscles when he is moving around though we will check a CK and a CMP.  Could consider holding his statin if needed.

## 2019-07-05 NOTE — Progress Notes (Signed)
Virtual Visit via telephone Note  This visit type was conducted due to national recommendations for restrictions regarding the COVID-19 pandemic (e.g. social distancing).  This format is felt to be most appropriate for this patient at this time.  All issues noted in this document were discussed and addressed.  No physical exam was performed (except for noted visual exam findings with Video Visits).   I connected with Andre [name] today at  1:15 PM EDT by telephone and verified that I am speaking with the correct person using two identifiers. Location patient: home Location provider: home office Persons participating in the virtual visit: patient, provider, Sonia Stickels (wife)   I discussed the limitations, risks, security and privacy concerns of performing an evaluation and management service by telephone and the availability of in person appointments. I also discussed with the patient that there may be a patient responsible charge related to this service. The patient expressed understanding and agreed to proceed.  Interactive audio and video telecommunications were attempted between this provider and patient, however failed, due to patient having technical difficulties OR patient did not have access to video capability.  We continued and completed visit with audio only.   Reason for visit: follow-up  HPI: Depression: Patient notes its been so long since he has felt good overall and that is contributing to his depression.  He just wants to sit and do nothing.  His chronic breathing issues are playing a role as well.  He notes no SI.  He has been on Lexapro 10 mg once daily and Wellbutrin.  He thought the Lexapro at 20 mg was making him drowsy though he continues to have issues with sleepiness despite lowering the dose.  He would be willing to increase the Lexapro dose again.  Hypertension: Typically 150s over 70s though has been up into the 834H systolically.  Taking losartan and carvedilol.   No chest pain.  He has chronic dyspnea which is relatively stable.  Pulmonary fibrosis: Diagnosed through pulmonology based on CT imaging.  He is on Ofev and has noted some decreased appetite.  The patient's wife notes he coughed up some green mucus this morning though it was only on one occasion.  Myalgias/arthralgias: Patient notes he is just sore all over particularly with movement.   ROS: See pertinent positives and negatives per HPI.  Past Medical History:  Diagnosis Date  . Atherosclerosis of abdominal aorta ([place])   . CAD (coronary artery disease)   . Cervical spondylosis 10/01/2013  . Chronic diastolic CHF (congestive heart failure) ([place])    a. 07/2016 Echo: >55%; b. 10/2016 Echo: EF 55-60%, Gr1 DD, Ao sclerosis w/o stenosis, sev dil LA; c. 08/2017 Echo: EF 60-65%, no rwma, Gr2 DD, mild AS, sev dil LA/RA.  [name] Coronary artery disease    a. 1998 s/p mini-cabg @ [facility] - LIMA->LAD;  b. 07/2016 St Echo: Inadequate HR w/ HTN response;  c.  08/2016 MV: EF 67%, no ischemia; d. 10/2016 NSTEMI/Cath: RCA 95p (4.0x26 Onyx DES), LIMA->LAD nl; e. 09/2017 Cath: LM 40/30, LAD 100ost, RI 80, LCX nl, OM2/3 nl, RCA patent stent, 66m LIMA->LAD nl-->Med Rx.  . DDD (degenerative disc disease), cervical   . DDD (degenerative disc disease), lumbar   . Depression   . GERD (gastroesophageal reflux disease)   . Hyperlipidemia   . Hypertension   . Hypothyroidism   . PAF (paroxysmal atrial fibrillation) ([facility])    a. s/p DCCV-->maintaining sinus on amiodarone;  b. CHA2DS2VASc = 5-->eliquis.  . Parkinson's  disease ([place])    tremors  . Pleural effusion, right    a. 09/2017 s/p thoracentesis.  [name] PNA (pneumonia) 08/26/2017  . Pulmonary embolism ([place]) 2011  . Pulmonary fibrosis ([place])   . 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 Sleep apnea    wears CPAP  . Thrombocytopenia ([place])     Past Surgical History:  Procedure Laterality Date  . BACK SURGERY  1960  . CARDIAC CATHETERIZATION    . CHOLECYSTECTOMY  2010  .  COLONOSCOPY WITH PROPOFOL N/A 06/07/2018   Procedure: COLONOSCOPY WITH PROPOFOL;  Surgeon: Lollie Sails, MD;  Location: [facility] ENDOSCOPY;  Service: Endoscopy;  Laterality: N/A;  . CORONARY ARTERY BYPASS GRAFT  01/07/1997  . CORONARY STENT INTERVENTION N/A 10/31/2016   Procedure: Coronary Stent Intervention;  Surgeon: Wellington Hampshire, MD;  Location: [place] CV LAB;  Service: Cardiovascular;  Laterality: N/A;  . ELECTROPHYSIOLOGIC STUDY N/A 07/14/2015   Procedure: CARDIOVERSION;  Surgeon: Yolonda Kida, MD;  Location: [facility] ORS;  Service: Cardiovascular;  Laterality: N/A;  . ELECTROPHYSIOLOGIC STUDY N/A 10/12/2015   Procedure: CARDIOVERSION;  Surgeon: Minna Merritts, MD;  Location: [facility] ORS;  Service: Cardiovascular;  Laterality: N/A;  . LEFT HEART CATH AND CORONARY ANGIOGRAPHY N/A 10/31/2016   Procedure: Left Heart Cath and Coronary Angiography;  Surgeon: Wellington Hampshire, MD;  Location: [place] [facility] LAB;  Service: Cardiovascular;  Laterality: N/A;  . OTHER SURGICAL HISTORY  1998   Bypass  . RIGHT/LEFT HEART CATH AND CORONARY ANGIOGRAPHY N/A 09/18/2017   Procedure: RIGHT/LEFT HEART CATH AND CORONARY ANGIOGRAPHY;  Surgeon: Wellington Hampshire, MD;  Location: [place] CV LAB;  Service: Cardiovascular;  Laterality: N/A;    Family History  Problem Relation Age of Onset  . Alcohol abuse Father     SOCIAL HX: Former smoker   Current Outpatient Medications:  .  albuterol (PROVENTIL) (2.5 MG/3ML) 0.083% nebulizer solution, USE ONE AMPULE(3ML) VIA NEBULIZER EVERY 4 HOURS AS NEEDED FOR WHEEZING OR SHORTNESS OF BREATH, Disp: 375 mL, Rfl: 11 .  albuterol (VENTOLIN HFA) 108 (90 Base) MCG/ACT inhaler, Inhale 2 puffs into the lungs every 6 (six) hours as needed for wheezing or shortness of breath., Disp: 8 g, Rfl: 2 .  buPROPion (WELLBUTRIN XL) 150 MG 24 hr tablet, TAKE 1 TABLET BY MOUTH DAILY, Disp: 90 tablet, Rfl: 1 .  carbidopa-levodopa (SINEMET IR) 25-250 MG tablet, TAKE TWO  TABLETS 3 TIMES DAILY, Disp: 540 tablet, Rfl: 3 .  carvedilol (COREG) 3.125 MG tablet, TAKE TWO TABLETS TWICE A DAY WITH MEALS, Disp: 120 tablet, Rfl: 3 .  ELIQUIS 5 MG TABS tablet, TAKE ONE TABLET BY MOUTH TWICE DAILY, Disp: 60 tablet, Rfl: 5 .  entacapone (COMTAN) 200 MG tablet, Take 1 tablet (200 mg total) by mouth 3 (three) times daily., Disp: 270 tablet, Rfl: 3 .  escitalopram (LEXAPRO) 20 MG tablet, Take 1 tablet (20 mg total) by mouth daily., Disp: 90 tablet, Rfl: 1 .  esomeprazole (NEXIUM) 20 MG capsule, Take 48 mg by mouth daily at 12 noon. , Disp: , Rfl:  .  fluticasone (FLONASE) 50 MCG/ACT nasal spray, TAKE 2 PUFFS IN EACH NOSTRIL DAILY, Disp: 16 g, Rfl: 3 .  furosemide (LASIX) 20 MG tablet, TAKE TWO TABLETS TWICE A DAY, Disp: 120 tablet, Rfl: 0 .  HYDROcodone-acetaminophen (NORCO/VICODIN) 5-325 MG tablet, Take by mouth., Disp: , Rfl:  .  isosorbide mononitrate (IMDUR) 30 MG 24 hr tablet, TAKE ONE TABLET BY MOUTH EVERY DAY, Disp: 30 tablet, Rfl: 5 .  ketoconazole (NIZORAL) 2 % cream, , Disp: , Rfl:  .  levothyroxine (SYNTHROID) 50 MCG tablet, TAKE ONE TABLET ON AN EMPTY STOMACH WITHA GLASS OF WATER AT LEAST 30 TO 60 MINUTES BEFORE BREAKFAST, Disp: 90 tablet, Rfl: 1 .  losartan (COZAAR) 100 MG tablet, Take 1 tablet (100 mg total) by mouth daily., Disp: 90 tablet, Rfl: 1 .  modafinil (PROVIGIL) 200 MG tablet, TAKE 1 TABLET BY MOUTH TWICE DAILY, Disp: 180 tablet, Rfl: 0 .  mometasone (ELOCON) 0.1 % cream, , Disp: , Rfl:  .  MYRBETRIQ 50 MG TB24 tablet, TAKE ONE TABLET EVERY DAY, Disp: 30 tablet, Rfl: 11 .  Nintedanib (OFEV) 150 MG CAPS, Take 1 capsule (150 mg total) by mouth 2 (two) times daily., Disp: 60 capsule, Rfl: 2 .  Polyethyl Glycol-Propyl Glycol (SYSTANE OP), Place 1 drop into both eyes 2 (two) times daily as needed (dry eyes)., Disp: , Rfl:  .  potassium chloride SA (KLOR-CON) 20 MEQ tablet, TAKE ONE TABLET EVERY DAY, Disp: 90 tablet, Rfl: 0 .  Respiratory Therapy Supplies  (FLUTTER) DEVI, 1 Device by Does not apply route daily., Disp: 1 each, Rfl: 0 .  rosuvastatin (CRESTOR) 10 MG tablet, TAKE 1 TABLET BY MOUTH DAILY, Disp: 90 tablet, Rfl: 0 .  Tiotropium Bromide-Olodaterol (STIOLTO RESPIMAT) 2.5-2.5 MCG/ACT AERS, Inhale 2 puffs into the lungs daily., Disp: 1 g, Rfl: 6  EXAM:  VITALS per patient if applicable:  GENERAL: alert, oriented, appears well and in no acute distress  HEENT: atraumatic, conjunttiva clear, no obvious abnormalities on inspection of external nose and ears  NECK: normal movements of the head and neck  LUNGS: on inspection no signs of respiratory distress, breathing rate appears normal, no obvious gross SOB, gasping or wheezing  CV: no obvious cyanosis  MS: moves all visible extremities without noticeable abnormality  PSYCH/NEURO: pleasant and cooperative, no obvious depression or anxiety, speech and thought processing grossly intact  ASSESSMENT AND PLAN:  Discussed the following assessment and plan:  Essential hypertension Not at goal.  Will increase losartan to 100 mg once daily.  We will check a CMP in 1 week.  Follow-up in 1 month.  Pulmonary fibrosis ([place]) Diagnosed through pulmonology.  Currently on treatment through them.  He is going to monitor to see if he has any additional cough or production and if he does he will let us know so we can consider imaging to evaluate for underlying infection.  Myalgia Likely due to strain on muscles when he is moving around though we will check a CK and a CMP.  Could consider holding his statin if needed.  Anxiety and depression Patient does note some depression.  We will increase his Lexapro to 20 mg daily.  He will monitor for any drowsiness.  If he has any issues with that he will let us know.  He will continue Wellbutrin.  Follow-up in 1 month.   Orders Placed This Encounter  Procedures  . Comp Met (CMET)    Standing Status:   Future    Standing Expiration Date:   07/04/2020  .  CK (Creatine Kinase)    Standing Status:   Future    Standing Expiration Date:   07/04/2020  . Sedimentation rate    Standing Status:   Future    Standing Expiration Date:   07/04/2020    Meds ordered this encounter  Medications  . escitalopram (LEXAPRO) 20 MG tablet    Sig: Take 1 tablet (20 mg total) by mouth  daily.    Dispense:  90 tablet    Refill:  1    FUTURE FILLS  . losartan (COZAAR) 100 MG tablet    Sig: Take 1 tablet (100 mg total) by mouth daily.    Dispense:  90 tablet    Refill:  1    Dosage Increase     I discussed the assessment and treatment plan with the patient. The patient was provided an opportunity to ask questions and all were answered. The patient agreed with the plan and demonstrated an understanding of the instructions.   The patient was advised to call back or seek an in-person evaluation if the symptoms worsen or if the condition fails to improve as anticipated.  I provided 18 minutes of non-face-to-face time during this encounter.   [name], MD

## 2019-07-05 NOTE — Assessment & Plan Note (Signed)
Patient does note some depression.  We will increase his Lexapro to 20 mg daily.  He will monitor for any drowsiness.  If he has any issues with that he will let us know.  He will continue Wellbutrin.  Follow-up in 1 month.

## 2019-07-05 NOTE — Assessment & Plan Note (Signed)
Not at goal.  Will increase losartan to 100 mg once daily.  We will check a CMP in 1 week.  Follow-up in 1 month.

## 2019-07-05 NOTE — Assessment & Plan Note (Signed)
Diagnosed through pulmonology.  Currently on treatment through them.  He is going to monitor to see if he has any additional cough or production and if he does he will let us know so we can consider imaging to evaluate for underlying infection.

## 2019-07-11 ENCOUNTER — Other Ambulatory Visit (INDEPENDENT_AMBULATORY_CARE_PROVIDER_SITE_OTHER): Payer: PPO

## 2019-07-11 ENCOUNTER — Other Ambulatory Visit: Payer: Self-pay

## 2019-07-11 DIAGNOSIS — M791 Myalgia, unspecified site: Secondary | ICD-10-CM

## 2019-07-11 LAB — COMPREHENSIVE METABOLIC PANEL
ALT: 7 U/L (ref 0–53)
AST: 13 U/L (ref 0–37)
Albumin: 3.7 g/dL (ref 3.5–5.2)
Alkaline Phosphatase: 81 U/L (ref 39–117)
BUN: 19 mg/dL (ref 6–23)
CO2: 28 mEq/L (ref 19–32)
Calcium: 9.1 mg/dL (ref 8.4–10.5)
Chloride: 101 mEq/L (ref 96–112)
Creatinine, Ser: 0.98 mg/dL (ref 0.40–1.50)
GFR: 73.29 mL/min (ref >60.00)
Glucose, Bld: 167 mg/dL — ABNORMAL HIGH (ref 70–99)
Potassium: 3.4 mEq/L — ABNORMAL LOW (ref 3.5–5.1)
Sodium: 137 mEq/L (ref 135–145)
Total Bilirubin: 1.1 mg/dL (ref 0.2–1.2)
Total Protein: 6 g/dL (ref 6.0–8.3)

## 2019-07-11 LAB — CK: Total CK: 41 U/L (ref 7–232)

## 2019-07-11 LAB — SEDIMENTATION RATE: Sed Rate: 18 mm/hr (ref 0–20)

## 2019-07-12 ENCOUNTER — Ambulatory Visit: Payer: PPO | Admitting: Primary Care

## 2019-07-12 ENCOUNTER — Telehealth: Payer: Self-pay | Admitting: Family Medicine

## 2019-07-12 NOTE — Telephone Encounter (Signed)
Pt wife  called about lab results

## 2019-07-12 NOTE — Telephone Encounter (Signed)
I called and left a message informing the patient that the results are not resulted and I will call them back on Monday with results.  Vail Vuncannon,cma

## 2019-07-15 NOTE — Telephone Encounter (Signed)
I called and left a VM for the patient to call back for lab results.  Andre [name],cma

## 2019-07-24 ENCOUNTER — Telehealth: Payer: Self-pay | Admitting: Pharmacist

## 2019-07-24 NOTE — Telephone Encounter (Signed)
Received voicemail from [facility] stating that patient and his wife had questions on how to adjust his medication timing and schedule.  Called to discuss with patient.  No answer left voicemail.  Advised that Helene Kelp reached out stating they had some medication questions and would be happy to set up an appointment to discuss.     Mariella Saa, PharmD, [place], [place] [facility]ty Pharmacist [phone]  07/24/2019 4:11 PM

## 2019-07-25 ENCOUNTER — Ambulatory Visit: Payer: PPO | Admitting: Urology

## 2019-07-25 ENCOUNTER — Ambulatory Visit: Payer: PPO | Admitting: Pulmonary Disease

## 2019-07-25 ENCOUNTER — Encounter: Payer: Self-pay | Admitting: Pulmonary Disease

## 2019-07-25 ENCOUNTER — Other Ambulatory Visit: Payer: Self-pay

## 2019-07-25 VITALS — BP 124/70 | HR 63 | Temp 97.1°F | Ht 67.0 in | Wt 252.0 lb

## 2019-07-25 DIAGNOSIS — J849 Interstitial pulmonary disease, unspecified: Secondary | ICD-10-CM

## 2019-07-25 NOTE — Patient Instructions (Signed)
We will schedule you for pulmonary function test, will get comprehensive metabolic panel to be done at [place] in 1 month's time Follow-up in 1 to 2 months.

## 2019-07-25 NOTE — Progress Notes (Signed)
Andre [name]    [id number]    [birth year]  Primary Care Physician:Sonnenberg, Angela Adam, MD  Referring Physician: [name], MD [address] [place],  [place] [address]  Chief complaint: Follow-up for COPD, asbestos related pulmonary fibrosis, sleep apnea.  HPI: 84 year old with history of pulmonary fibrosis, recurrent respiratory failure, eosinophilic pleural effusion, chronic diastolic heart failure, sleep apnea, Parkinson's,  Previously followed [facility].  He was also evaluated at [facility] for pulmonary fibrosis thought to be secondary to prior asbestos exposure.  Recommendation was made to follow-up with a CT in January 2020.  However he cannot keep the appointment due to high co-pay and wishes to transfer care to [facility].  He has had at least 3 admissions over the past year for recurrent respiratory failure, HCAP, right pleural effusion status post thoracentesis on 09/19/2017 with cell count showing exudate with elevated eosinophils.   He used to remain active until age 54 when he had a CABG.  Has run 24 marathons in the past.  After his CABG he took up long distance cycling which he had to give up about 10 years ago.  Initially on Anoro.  This was changed to [facility] in May 2020 as he did not achieve adequate inspiratory flow. Treated with antibiotics in December 2020 with chest x-ray showing right lung pneumonia for which he was treated with antibiotics Follow-up CT scan shows resolution of pneumonia but shows progression of pulmonary fibrosis.   Started on Ofev 05/26/18.    Pets: No pets Occupation: Worked as a Actor ILD questionnaire 05/15/2018: Exposure to asbestos, no other significant exposure. Smoking history:20-pack-year smoker.  Quit smoking in 1985 Travel history: No significant recent travel Relevant family history: No family history of lung disease.  Interim History: Tolerating ofev with no GI symptoms  Overall he  feels improved compared to before increased exercise capacity.  Still has some cough with congestion  Outpatient Encounter Medications as of 07/25/2019  Medication Sig  . albuterol (PROVENTIL) (2.5 MG/3ML) 0.083% nebulizer solution USE ONE AMPULE(3ML) VIA NEBULIZER EVERY 4 HOURS AS NEEDED FOR WHEEZING OR SHORTNESS OF BREATH  . albuterol (VENTOLIN HFA) 108 (90 Base) MCG/ACT inhaler Inhale 2 puffs into the lungs every 6 (six) hours as needed for wheezing or shortness of breath.  [name] buPROPion (WELLBUTRIN XL) 150 MG 24 hr tablet TAKE 1 TABLET BY MOUTH DAILY  . carbidopa-levodopa (SINEMET IR) 25-250 MG tablet TAKE TWO TABLETS 3 TIMES DAILY  . carvedilol (COREG) 3.125 MG tablet TAKE TWO TABLETS TWICE A DAY WITH MEALS  . ELIQUIS 5 MG TABS tablet TAKE ONE TABLET BY MOUTH TWICE DAILY  . entacapone (COMTAN) 200 MG tablet Take 1 tablet (200 mg total) by mouth 3 (three) times daily.  [name] esomeprazole (NEXIUM) 20 MG capsule Take 48 mg by mouth daily at 12 noon.   . fluticasone (FLONASE) 50 MCG/ACT nasal spray TAKE 2 PUFFS IN EACH NOSTRIL DAILY  . furosemide (LASIX) 20 MG tablet TAKE TWO TABLETS TWICE A DAY  . HYDROcodone-acetaminophen (NORCO/VICODIN) 5-325 MG tablet Take by mouth.  . isosorbide mononitrate (IMDUR) 30 MG 24 hr tablet TAKE ONE TABLET BY MOUTH EVERY DAY  . ketoconazole (NIZORAL) 2 % cream   . levothyroxine (SYNTHROID) 50 MCG tablet TAKE ONE TABLET ON AN EMPTY STOMACH WITHA GLASS OF WATER AT LEAST 30 TO 60 MINUTES BEFORE BREAKFAST  . losartan (COZAAR) 100 MG tablet Take 1 tablet (100 mg total) by mouth daily.  [name]  mometasone (ELOCON) 0.1 % cream   . MYRBETRIQ 50 MG TB24 tablet TAKE ONE TABLET EVERY DAY  . Nintedanib (OFEV) 150 MG CAPS Take 1 capsule (150 mg total) by mouth 2 (two) times daily.  Vladimir Faster Glycol-Propyl Glycol (SYSTANE OP) Place 1 drop into both eyes 2 (two) times daily as needed (dry eyes).  . potassium chloride SA (KLOR-CON) 20 MEQ tablet TAKE ONE TABLET EVERY DAY  . Respiratory  Therapy Supplies (FLUTTER) DEVI 1 Device by Does not apply route daily.  . rosuvastatin (CRESTOR) 10 MG tablet TAKE 1 TABLET BY MOUTH DAILY  . Tiotropium Bromide-Olodaterol (STIOLTO RESPIMAT) 2.5-2.5 MCG/ACT AERS Inhale 2 puffs into the lungs daily.  . [DISCONTINUED] escitalopram (LEXAPRO) 20 MG tablet Take 1 tablet (20 mg total) by mouth daily.  . [DISCONTINUED] modafinil (PROVIGIL) 200 MG tablet TAKE 1 TABLET BY MOUTH TWICE DAILY   No facility-administered encounter medications on file as of 07/25/2019.   Physical Exam: Blood pressure 124/70, pulse 63, temperature (!) 97.1 F (36.2 C), temperature source Temporal, height 5\' 7"  (1.702 m), weight 252 lb (114.3 kg), SpO2 95 %. Gen:      No acute distress HEENT:  EOMI, sclera anicteric Neck:     No masses; no thyromegaly Lungs:    Clear to auscultation bilaterally; normal respiratory effort CV:         Regular rate and rhythm; no murmurs Abd:      + bowel sounds; soft, non-tender; no palpable masses, no distension Ext:    No edema; adequate peripheral perfusion Skin:      Warm and dry; no rash Neuro: alert and oriented x 3 Psych: normal mood and affect  Data Reviewed: Imaging: CT chest 12/06/2011- peripheral and basal fibrotic changes, calcified pleural plaques.  CT chest 06/09/2015- peripheral and basal fibrotic changes with no honeycombing.  Calcified pleural plaques  CT high-resolution 05/30/2017- moderate centrilobular emphysema, mild basilar subpleural reticulation, groundglass and traction bronchiectasis.  No honeycombing.  Calcified granuloma in the right upper lobe, calcified pleural plaques.  CT high-resolution 06/08/2018- moderate emphysema, mild basilar reticulation, traction bronchiectasis.  No honeycombing, calcified pleural plaques with right pleural thickening.  Stable calcified granuloma.  I have reviewed the images personally. I have reviewed the images personally.  CT chest high-resolution [[facility]] 01/22/2018 1. Primarily  basilar peripheral interlobular septal thickening. Findings may represent changes of chronic edema vs early fibrosis. New small right and trace left pleural effusions with associated calcified pleural plaques. Correlate with history of asbestos exposure.  2. Apparent increased thickening of the right basilar pleura, incompletely assessed. Consider PET/CT or pleural fluid sampling for further evaluation if not already obtained. Differential considerations include fibrosis or malignancy such as mesothelioma. 3. Aortic valve calcifications.  4. Scattered less than 3 mm pulmonary nodules. If the patient is low risk for lung cancer, no further follow-up is recommended. If the patient is high risk for lung cancer, consider 12 month follow-up CT. (2017 Fleischner Guidelines)  PFTs: 09/25/2012 FVC 2.52 [69%], FEV1 1.95 [75%), F/F 77, TLC 72%, DLCO 70% Moderate obstructive airways, minimal restriction and diffusion defect.  06/12/2018 FVC 2.38 [16%], FEV1 1.66 [66%], F/F 70, TLC 3.95 (61%), DLCO 16.34 [73%] Moderate obstruction with restriction and minimal diffusion defect  Labs: ANA 01/22/2018- negative, CCP-negative  Pleural fluid 09/19/2017- LDH 462, total protein 3.9 WBC 3661, 59% eos, 27% lymphs, 10% neutrophils Cytology-mixed reactive and inflammatory cells.  No malignancy.  Hepatic panel 2/22-stable  Cardiac Echocardiogram 08/28/2017- LVEF 123456, grade 2 diastolic dysfunction, mild aortic  stenosis.  Severe dilatation of left atrium and right atrium, trivial pericardial effusion.  Cardiac catheterization 09/18/2017 Significant three-vessel coronary artery disease with patent grafts.  Right heart catheterization shows mildly elevated filling pressures, minimal pulmonary hypertension Wedge 12, PA pressure 39/17 (24), cardiac output 5.08 L/min PVR 189, 2.4 Wood units  Sleep CPAP titration 11/04/2015- CPAP titrated to 12 cm of water.  Download 05/14/2018 Set pressure 12 cm of  water 100% usage greater than 4 hours Residual AHI 1.2  Download 03/24/2019 Set pressure of 12 cm 100% usage greater than 4 hours, residual AHI 1.8  Assessment:  Pulmonary fibrosis, asbestosis CT scan reviewed with basilar fibrosis, pleural calcifications consistent with asbestos exposure Due to progression of pulmonary fibrosis he has been initiated on Ofev Recent LFTs are okay  Dyspnea is multifactorial from ILD, obesity, diastolic heart failure, coronary artery disease Suspect anxiety is a significant component to his episodes of dyspnea Will benefit from pulmonary rehab but he will like to hold off until the Covid pandemic has improved  Emphysema COPD PFTs reviewed with moderate obstruction.   Was initially on Anoro.  This was changed to Stiolto Stable after recent exacerbation Continue flutter valve for mucociliary clearance  Right pleural effusion exudative, eosinophilic S.p thoracentesis dring hospitalization in 2019.  The cell count is eosinophilic which could be from pneumonia versus asbestos-related effusion Follow-up CT shows thickening of the pleura in that area We will need to monitor this area for possible mesothelioma.   Obstructive sleep apnea Stable on CPAP Download reviewed with good compliance and effectiveness.    Health maintenance 12/11/2018 -influenza 05/10/2016-Prevnar  This appointment required 45 minutes of patient care (this includes precharting, chart review, review of results, face-to-face care, etc.).  Plan/Recommendations: Continue Ofev, CPAP Stiolto, flutter valve Follow-up in 1 month for symptom check and LFTs.  Marshell Garfinkel MD [facility] 07/25/2019, 3:12 PM  CC: [name], MD

## 2019-07-30 ENCOUNTER — Telehealth: Payer: Self-pay | Admitting: Pulmonary Disease

## 2019-07-30 DIAGNOSIS — H903 Sensorineural hearing loss, bilateral: Secondary | ICD-10-CM | POA: Diagnosis not present

## 2019-07-30 NOTE — Telephone Encounter (Signed)
Spoke with the pt's spouse  I advised that Andre [name] with [employer] has been trying to reach pt regarding Ofev assistance  She states that they do not need assistance with paying for drug but will call her anyway- phone number provided  Nothing further needed

## 2019-07-30 NOTE — Telephone Encounter (Signed)
ATC pt, no answer. Left message for pt to call back.  

## 2019-07-30 NOTE — Telephone Encounter (Signed)
Duplicate msg.

## 2019-07-31 ENCOUNTER — Other Ambulatory Visit: Payer: Self-pay

## 2019-07-31 ENCOUNTER — Ambulatory Visit: Payer: PPO | Admitting: Neurology

## 2019-07-31 ENCOUNTER — Encounter: Payer: Self-pay | Admitting: Neurology

## 2019-07-31 ENCOUNTER — Telehealth: Payer: Self-pay | Admitting: Neurology

## 2019-07-31 VITALS — BP 137/65 | HR 62 | Temp 98.2°F | Ht 67.0 in | Wt 252.0 lb

## 2019-07-31 DIAGNOSIS — G20 Parkinson's disease: Secondary | ICD-10-CM

## 2019-07-31 DIAGNOSIS — G4489 Other headache syndrome: Secondary | ICD-10-CM

## 2019-07-31 DIAGNOSIS — R269 Unspecified abnormalities of gait and mobility: Secondary | ICD-10-CM | POA: Diagnosis not present

## 2019-07-31 HISTORY — DX: Unspecified abnormalities of gait and mobility: R26.9

## 2019-07-31 NOTE — Progress Notes (Signed)
Reason for visit: Parkinson's disease  Andre [name] is an 84 y.o. male  History of present illness:  Andre [name] is an 84 year old right-handed white male with a history of Parkinson's disease that was initially was diagnosed in 2010 through [facility].  The patient has been followed here since 2018, he has been on fairly high-dose Sinemet at 25/250 mg, taking 2 tablets 3 times daily with Comtan.  The patient has had some problems with dyskinesias, he also reports a mild memory disturbance with short-term memory problems.  Over the last 6 months or so, he has developed some troubles with dizziness primarily with standing, he feels staggery with walking.  Is also had ongoing issues with sleep apnea and chronic fatigue and daytime drowsiness, he did not get benefit from Provigil.  The patient is being followed for pulmonary fibrosis and has had gradually worsening shortness of breath with minimal activity.  He also has congestive heart failure.  The patient questions whether the diagnosis of Parkinson's disease is accurate.  The patient has not had any recent falls.  He denies any hallucinations at this point.  Past Medical History:  Diagnosis Date  . Atherosclerosis of abdominal aorta ([place])   . CAD (coronary artery disease)   . Cervical spondylosis 10/01/2013  . Chronic diastolic CHF (congestive heart failure) ([place])    a. 07/2016 Echo: >55%; b. 10/2016 Echo: EF 55-60%, Gr1 DD, Ao sclerosis w/o stenosis, sev dil LA; c. 08/2017 Echo: EF 60-65%, no rwma, Gr2 DD, mild AS, sev dil LA/RA.  [name] Coronary artery disease    a. 1998 s/p mini-cabg @ [facility] - LIMA->LAD;  b. 07/2016 St Echo: Inadequate HR w/ HTN response;  c.  08/2016 MV: EF 67%, no ischemia; d. 10/2016 NSTEMI/Cath: RCA 95p (4.0x26 Onyx DES), LIMA->LAD nl; e. 09/2017 Cath: LM 40/30, LAD 100ost, RI 80, LCX nl, OM2/3 nl, RCA patent stent, 53m, LIMA->LAD nl-->Med Rx.  . DDD (degenerative disc disease), cervical   . DDD (degenerative disc disease),  lumbar   . Depression   . Gait abnormality 07/31/2019  . GERD (gastroesophageal reflux disease)   . Hyperlipidemia   . Hypertension   . Hypothyroidism   . PAF (paroxysmal atrial fibrillation) ([facility])    a. s/p DCCV-->maintaining sinus on amiodarone;  b. CHA2DS2VASc = 5-->eliquis.  . Parkinson's disease ([place])    tremors  . Pleural effusion, right    a. 09/2017 s/p thoracentesis.  [name] PNA (pneumonia) 08/26/2017  . Pulmonary embolism ([place]) 2011  . Pulmonary fibrosis ([place])   . 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 Sleep apnea    wears CPAP  . Thrombocytopenia ([place])     Past Surgical History:  Procedure Laterality Date  . BACK SURGERY  1960  . CARDIAC CATHETERIZATION    . CHOLECYSTECTOMY  2010  . COLONOSCOPY WITH PROPOFOL N/A 06/07/2018   Procedure: COLONOSCOPY WITH PROPOFOL;  Surgeon: Lollie Sails, MD;  Location: [facility] ENDOSCOPY;  Service: Endoscopy;  Laterality: N/A;  . CORONARY ARTERY BYPASS GRAFT  01/07/1997  . CORONARY STENT INTERVENTION N/A 10/31/2016   Procedure: Coronary Stent Intervention;  Surgeon: Wellington Hampshire, MD;  Location: [place] CV LAB;  Service: Cardiovascular;  Laterality: N/A;  . ELECTROPHYSIOLOGIC STUDY N/A 07/14/2015   Procedure: CARDIOVERSION;  Surgeon: Yolonda Kida, MD;  Location: [facility] ORS;  Service: Cardiovascular;  Laterality: N/A;  . ELECTROPHYSIOLOGIC STUDY N/A 10/12/2015   Procedure: CARDIOVERSION;  Surgeon: Minna Merritts, MD;  Location: [facility] ORS;  Service: Cardiovascular;  Laterality: N/A;  .  LEFT HEART CATH AND CORONARY ANGIOGRAPHY N/A 10/31/2016   Procedure: Left Heart Cath and Coronary Angiography;  Surgeon: Wellington Hampshire, MD;  Location: [place] [facility];  Service: Cardiovascular;  Laterality: N/A;  . OTHER SURGICAL HISTORY  1998   Bypass  . RIGHT/LEFT HEART CATH AND CORONARY ANGIOGRAPHY N/A 09/18/2017   Procedure: RIGHT/LEFT HEART CATH AND CORONARY ANGIOGRAPHY;  Surgeon: Wellington Hampshire, MD;  Location: [place] [facility];   Service: Cardiovascular;  Laterality: N/A;    Family History  Problem Relation Age of Onset  . Alcohol abuse Father     Social history:  reports that he quit smoking about 47 years ago. His smoking use included cigarettes, pipe, and cigars. He has a 10.00 pack-year smoking history. He quit smokeless tobacco use about 47 years ago.  His smokeless tobacco use included chew. He reports current alcohol use of about 4.0 standard drinks of alcohol per week. He reports that he does not use drugs.    Allergies  Allergen Reactions  . Pravastatin Other (See Comments)  . Prednisone Other (See Comments)    Pt states that med makes him hyper Pt states that med makes him hyper    Medications:  Prior to Admission medications   Medication Sig Start Date End Date Taking? Authorizing Provider  albuterol (PROVENTIL) (2.5 MG/3ML) 0.083% nebulizer solution USE ONE AMPULE(3ML) VIA NEBULIZER EVERY 4 HOURS AS NEEDED FOR WHEEZING OR SHORTNESS OF BREATH 05/09/19   Mannam, Praveen, MD  albuterol (VENTOLIN HFA) 108 (90 Base) MCG/ACT inhaler Inhale 2 puffs into the lungs every 6 (six) hours as needed for wheezing or shortness of breath. 05/29/19   Martyn Ehrich, NP  buPROPion (WELLBUTRIN XL) 150 MG 24 hr tablet TAKE 1 TABLET BY MOUTH DAILY 06/14/19   [name], MD  carbidopa-levodopa (SINEMET IR) 25-250 MG tablet TAKE TWO TABLETS 3 TIMES DAILY 03/26/19   Kathrynn Ducking, MD  carvedilol (COREG) 3.125 MG tablet TAKE TWO TABLETS TWICE A DAY WITH MEALS 05/13/19   Wellington Hampshire, MD  ELIQUIS 5 MG TABS tablet TAKE ONE TABLET BY MOUTH TWICE DAILY 02/22/19   Wellington Hampshire, MD  entacapone (COMTAN) 200 MG tablet Take 1 tablet (200 mg total) by mouth 3 (three) times daily. 06/27/19   Kathrynn Ducking, MD  esomeprazole (NEXIUM) 20 MG capsule Take 48 mg by mouth daily at 12 noon.     [provider]  fluticasone (FLONASE) 50 MCG/ACT nasal spray TAKE 2 PUFFS IN EACH NOSTRIL DAILY 06/29/18   [name], MD  furosemide (LASIX) 20 MG tablet TAKE TWO TABLETS TWICE A DAY 07/08/19   Wellington Hampshire, MD  HYDROcodone-acetaminophen (NORCO/VICODIN) 5-325 MG tablet Take by mouth. 01/03/18   [provider]  isosorbide mononitrate (IMDUR) 30 MG 24 hr tablet TAKE ONE TABLET BY MOUTH EVERY DAY 03/13/19   Wellington Hampshire, MD  ketoconazole (NIZORAL) 2 % cream  01/01/18   [provider]  levothyroxine (SYNTHROID) 50 MCG tablet TAKE ONE TABLET ON AN EMPTY STOMACH WITHA GLASS OF WATER AT LEAST 30 TO 60 MINUTES BEFORE BREAKFAST 04/22/19   [name], MD  losartan (COZAAR) 100 MG tablet Take 1 tablet (100 mg total) by mouth daily. 07/05/19   [name], MD  mometasone (ELOCON) 0.1 % cream  01/01/18   [provider]  MYRBETRIQ 50 MG TB24 tablet TAKE ONE TABLET EVERY DAY 05/13/19   Stoioff, Ronda Fairly, MD  Nintedanib (OFEV) 150 MG  CAPS Take 1 capsule (150 mg total) by mouth 2 (two) times daily. 05/22/19   Mannam, Hart Robinsons, MD  Polyethyl Glycol-Propyl Glycol (SYSTANE OP) Place 1 drop into both eyes 2 (two) times daily as needed (dry eyes).    [provider]  potassium chloride SA (KLOR-CON) 20 MEQ tablet TAKE ONE TABLET EVERY DAY 05/21/19   Wellington Hampshire, MD  Respiratory Therapy Supplies (FLUTTER) DEVI 1 Device by Does not apply route daily. 05/29/19   Martyn Ehrich, NP  rosuvastatin (CRESTOR) 10 MG tablet TAKE 1 TABLET BY MOUTH DAILY 05/13/19   Wellington Hampshire, MD  Tiotropium Bromide-Olodaterol (STIOLTO RESPIMAT) 2.5-2.5 MCG/ACT AERS Inhale 2 puffs into the lungs daily. 03/20/19   Mannam, Hart Robinsons, MD    ROS:  Out of a complete 14 system review of symptoms, the patient complains only of the following symptoms, and all other reviewed systems are negative.  Dyskinesias Dizziness Walking problems Shortness of breath Memory problems  Blood pressure 137/65, pulse 62, temperature 98.2 F (36.8 C), height 5\' 7"  (1.702 m), weight 252 lb (114.3 kg).   Blood  pressure, right arm, sitting is [id number] systolic.  Blood pressure, right arm, standing is [id number] systolic.  Physical Exam  General: The patient is alert and cooperative at the time of the examination.  The patient is markedly obese.  Skin: No significant peripheral edema is noted.   Neurologic Exam  Mental status: The patient is alert and oriented x 3 at the time of the examination. The patient has apparent normal recent and remote memory, with an apparently normal attention span and concentration ability.   Cranial nerves: Facial symmetry is present. Speech is normal, no aphasia or dysarthria is noted. Extraocular movements are full. Visual fields are full.  Motor: The patient has good strength in all 4 extremities.  Sensory examination: Soft touch sensation is symmetric on the face, arms, and legs.  Coordination: The patient has good finger-nose-finger and heel-to-shin bilaterally.  Dyskinesias involving the legs and body are noted.  Gait and station: The patient has the ability to stand from a seated position with arms crossed.  Once up, the patient can walk without assistance, gait is somewhat wide-based.  Tandem gait was not attempted.  Romberg is negative.  Reflexes: Deep tendon reflexes are symmetric.   Assessment/Plan:  1.  Parkinson's disease  2.  Gait disturbance  3.  New onset headache  4.  Pulmonary fibrosis, chronic shortness of breath  5.  Sleep apnea, chronic excessive daytime drowsiness  6.  Mild memory disturbance  The patient reports onset of a new daily dull headache that began 3 to 4 months ago.  The headache is between the eyes, deep in the head.  The patient will undergo a sedimentation rate and C-reactive protein blood work and have a head scan.  He is suspicious of the diagnosis of Parkinson's disease, the patient was on high-dose Sinemet when he was seen here in 2018, the diagnosis was made at [facility] in 2010.  We will check a DaTscan.  The patient  does not wish to undergo physical therapy for gait training currently.  He has gotten his Covid virus vaccination.  He will follow-up here in 4 to 5 months.  Jill Alexanders MD 07/31/2019 7:46 AM  [facility] [address] [place], [place] [address]  Phone [phone] Fax [phone]

## 2019-07-31 NOTE — Telephone Encounter (Signed)
health team order sent to GI. No auth they will reach out to the patient to schedule.  

## 2019-08-01 ENCOUNTER — Telehealth: Payer: Self-pay | Admitting: Neurology

## 2019-08-01 LAB — C-REACTIVE PROTEIN: CRP: 3 mg/L (ref 0–10)

## 2019-08-01 LAB — SEDIMENTATION RATE: Sed Rate: 28 mm/hr (ref 0–30)

## 2019-08-01 NOTE — Telephone Encounter (Signed)
Noted wife has all information

## 2019-08-01 NOTE — Telephone Encounter (Signed)
Pt's wife called back. Please call back when available.  

## 2019-08-01 NOTE — Telephone Encounter (Signed)
Called and left message for patient's wife and asked her to call me back patient is scheduled 08/22/2019 at 9:00 at [facility] if patient can't make this apt they can call [phone] to reschedule .

## 2019-08-02 DIAGNOSIS — H903 Sensorineural hearing loss, bilateral: Secondary | ICD-10-CM | POA: Diagnosis not present

## 2019-08-05 ENCOUNTER — Other Ambulatory Visit: Payer: Self-pay | Admitting: *Deleted

## 2019-08-05 DIAGNOSIS — G4733 Obstructive sleep apnea (adult) (pediatric): Secondary | ICD-10-CM | POA: Diagnosis not present

## 2019-08-05 DIAGNOSIS — J449 Chronic obstructive pulmonary disease, unspecified: Secondary | ICD-10-CM | POA: Diagnosis not present

## 2019-08-05 DIAGNOSIS — J849 Interstitial pulmonary disease, unspecified: Secondary | ICD-10-CM

## 2019-08-05 DIAGNOSIS — J441 Chronic obstructive pulmonary disease with (acute) exacerbation: Secondary | ICD-10-CM | POA: Diagnosis not present

## 2019-08-05 DIAGNOSIS — J45909 Unspecified asthma, uncomplicated: Secondary | ICD-10-CM | POA: Diagnosis not present

## 2019-08-06 ENCOUNTER — Other Ambulatory Visit
Admission: RE | Admit: 2019-08-06 | Discharge: 2019-08-06 | Disposition: A | Discharge status: Home / Self Care | Payer: PPO | Source: Ambulatory Visit | Attending: Pulmonary Disease | Admitting: Pulmonary Disease

## 2019-08-06 ENCOUNTER — Other Ambulatory Visit: Payer: Self-pay

## 2019-08-06 DIAGNOSIS — Z20822 Contact with and (suspected) exposure to covid-19: Secondary | ICD-10-CM | POA: Diagnosis not present

## 2019-08-06 DIAGNOSIS — Z01812 Encounter for preprocedural laboratory examination: Secondary | ICD-10-CM | POA: Insufficient documentation

## 2019-08-07 LAB — SARS CORONAVIRUS 2 (TAT 6-24 HRS): SARS Coronavirus 2: NEGATIVE

## 2019-08-09 ENCOUNTER — Other Ambulatory Visit: Payer: Self-pay

## 2019-08-09 ENCOUNTER — Ambulatory Visit (INDEPENDENT_AMBULATORY_CARE_PROVIDER_SITE_OTHER): Payer: PPO | Admitting: Pulmonary Disease

## 2019-08-09 DIAGNOSIS — J849 Interstitial pulmonary disease, unspecified: Secondary | ICD-10-CM | POA: Diagnosis not present

## 2019-08-09 LAB — PULMONARY FUNCTION TEST
DL/VA % pred: 112 %
DL/VA: 4.42 ml/min/mmHg/L
DLCO cor % pred: 77 %
DLCO cor: 17.19 ml/min/mmHg
DLCO unc % pred: 77 %
DLCO unc: 17.19 ml/min/mmHg
FEF 25-75 Post: 1.31 L/sec
FEF 25-75 Pre: 1.2 L/sec
FEF2575-%Change-Post: 9 %
FEF2575-%Pred-Post: 79 %
FEF2575-%Pred-Pre: 72 %
FEV1-%Change-Post: 1 %
FEV1-%Pred-Post: 75 %
FEV1-%Pred-Pre: 74 %
FEV1-Post: 1.86 L
FEV1-Pre: 1.83 L
FEV1FVC-%Change-Post: 1 %
FEV1FVC-%Pred-Pre: 100 %
FEV6-%Change-Post: -1 %
FEV6-%Pred-Post: 77 %
FEV6-%Pred-Pre: 78 %
FEV6-Post: 2.51 L
FEV6-Pre: 2.55 L
FEV6FVC-%Pred-Post: 108 %
FEV6FVC-%Pred-Pre: 108 %
FVC-%Change-Post: 0 %
FVC-%Pred-Post: 73 %
FVC-%Pred-Pre: 73 %
FVC-Post: 2.56 L
FVC-Pre: 2.55 L
Post FEV1/FVC ratio: 73 %
Post FEV6/FVC ratio: 100 %
Pre FEV1/FVC ratio: 72 %
Pre FEV6/FVC Ratio: 100 %
RV % pred: 54 %
RV: 1.38 L
TLC % pred: 60 %
TLC: 3.91 L

## 2019-08-09 NOTE — Progress Notes (Signed)
Full PFT performed today. °

## 2019-08-16 ENCOUNTER — Ambulatory Visit
Admission: RE | Admit: 2019-08-16 | Discharge: 2019-08-16 | Disposition: A | Discharge status: Home / Self Care | Payer: PPO | Source: Ambulatory Visit | Attending: Neurology | Admitting: Neurology

## 2019-08-16 DIAGNOSIS — R519 Headache, unspecified: Secondary | ICD-10-CM | POA: Diagnosis not present

## 2019-08-16 DIAGNOSIS — G4489 Other headache syndrome: Secondary | ICD-10-CM

## 2019-08-17 ENCOUNTER — Other Ambulatory Visit: Payer: Self-pay | Admitting: Pulmonary Disease

## 2019-08-17 DIAGNOSIS — Z79899 Other long term (current) drug therapy: Secondary | ICD-10-CM

## 2019-08-19 ENCOUNTER — Telehealth: Payer: Self-pay | Admitting: Neurology

## 2019-08-19 NOTE — Telephone Encounter (Signed)
I called the patient.  MRI of the brain is similar to what was seen in 2017, no source of headaches are seen.  The DaTscan will be done on 6 May, he is to stop his Wellbutrin now.  He is not on Lexapro.   CT head 08/16/19:  IMPRESSION: This CT scan of the head without contrast shows the following: 1.   There are no acute findings. 2.   Mild generalized cortical atrophy similar in appearance to the 2017 CT scan and typical for age. 3.   Chronic microvascular ischemic changes, most notable in the periatrial white matter.  The appearance is similar to the 2017 CT scan.

## 2019-08-21 ENCOUNTER — Ambulatory Visit (INDEPENDENT_AMBULATORY_CARE_PROVIDER_SITE_OTHER): Payer: PPO | Admitting: Internal Medicine

## 2019-08-21 ENCOUNTER — Ambulatory Visit (INDEPENDENT_AMBULATORY_CARE_PROVIDER_SITE_OTHER): Payer: PPO

## 2019-08-21 ENCOUNTER — Telehealth: Payer: Self-pay

## 2019-08-21 ENCOUNTER — Other Ambulatory Visit: Payer: Self-pay

## 2019-08-21 ENCOUNTER — Encounter: Payer: Self-pay | Admitting: Internal Medicine

## 2019-08-21 VITALS — BP 124/70 | HR 58 | Temp 95.3°F | Ht 67.0 in | Wt 246.0 lb

## 2019-08-21 DIAGNOSIS — E559 Vitamin D deficiency, unspecified: Secondary | ICD-10-CM

## 2019-08-21 DIAGNOSIS — E876 Hypokalemia: Secondary | ICD-10-CM | POA: Diagnosis not present

## 2019-08-21 DIAGNOSIS — R7303 Prediabetes: Secondary | ICD-10-CM

## 2019-08-21 DIAGNOSIS — M79674 Pain in right toe(s): Secondary | ICD-10-CM | POA: Diagnosis not present

## 2019-08-21 DIAGNOSIS — J8410 Pulmonary fibrosis, unspecified: Secondary | ICD-10-CM

## 2019-08-21 DIAGNOSIS — M79671 Pain in right foot: Secondary | ICD-10-CM | POA: Diagnosis not present

## 2019-08-21 LAB — BASIC METABOLIC PANEL
BUN: 16 mg/dL (ref 6–23)
CO2: 28 mEq/L (ref 19–32)
Calcium: 9.4 mg/dL (ref 8.4–10.5)
Chloride: 101 mEq/L (ref 96–112)
Creatinine, Ser: 0.81 mg/dL (ref 0.40–1.50)
GFR: 91.28 mL/min (ref >60.00)
Glucose, Bld: 116 mg/dL — ABNORMAL HIGH (ref 70–99)
Potassium: 3.9 mEq/L (ref 3.5–5.1)
Sodium: 138 mEq/L (ref 135–145)

## 2019-08-21 LAB — CBC WITH DIFFERENTIAL/PLATELET
Basophils Absolute: 0.1 10*3/uL (ref 0.0–0.1)
Basophils Relative: 0.5 % (ref 0.0–3.0)
Eosinophils Absolute: 0.2 10*3/uL (ref 0.0–0.7)
Eosinophils Relative: 1.4 % (ref 0.0–5.0)
HCT: 45.6 % (ref 39.0–52.0)
Hemoglobin: 14.8 g/dL (ref 13.0–17.0)
Lymphocytes Relative: 16.6 % (ref 12.0–46.0)
Lymphs Abs: 1.9 10*3/uL (ref 0.7–4.0)
MCHC: 32.4 g/dL (ref 30.0–36.0)
MCV: 93.4 fl (ref 78.0–100.0)
Monocytes Absolute: 0.9 10*3/uL (ref 0.1–1.0)
Monocytes Relative: 7.7 % (ref 3.0–12.0)
Neutro Abs: 8.6 10*3/uL — ABNORMAL HIGH (ref 1.4–7.7)
Neutrophils Relative %: 73.8 % (ref 43.0–77.0)
Platelets: 119 10*3/uL — ABNORMAL LOW (ref 150.0–400.0)
RBC: 4.88 Mil/uL (ref 4.22–5.81)
RDW: 15.2 % (ref 11.5–15.5)
WBC: 11.6 10*3/uL — ABNORMAL HIGH (ref 4.0–10.5)

## 2019-08-21 LAB — HEMOGLOBIN A1C: Hgb A1c MFr Bld: 6 % (ref 4.6–6.5)

## 2019-08-21 LAB — MAGNESIUM: Magnesium: 1.8 mg/dL (ref 1.5–2.5)

## 2019-08-21 LAB — VITAMIN D 25 HYDROXY (VIT D DEFICIENCY, FRACTURES): VITD: 20.43 ng/mL — ABNORMAL LOW (ref 30.00–100.00)

## 2019-08-21 LAB — URIC ACID: Uric Acid, Serum: 5.8 mg/dL (ref 4.0–7.8)

## 2019-08-21 MED ORDER — PREDNISONE 20 MG PO TABS: 20.0000 mg | ORAL_TABLET | Freq: Every day | ORAL | 0 refills | Status: DC

## 2019-08-21 MED ORDER — ESOMEPRAZOLE MAGNESIUM 40 MG PO CPDR: 40.0000 mg | DELAYED_RELEASE_CAPSULE | Freq: Every day | ORAL | 2 refills | Status: DC

## 2019-08-21 MED ORDER — METHYLPREDNISOLONE ACETATE 40 MG/ML IJ SUSP
40.0000 mg | Freq: Once | INTRAMUSCULAR | Status: AC
  Administered 2019-08-21: 40 mg via INTRAMUSCULAR

## 2019-08-21 NOTE — Progress Notes (Signed)
Chief Complaint  Patient presents with  . Toe Pain    mainly in the right foot big toe and over into the toe beside this. Pain off and on at 10/10. Onset a week ago with no know injury.    F/u with wife toe pain  1. Right toe great and 2nd toe pain 10/10 x 1 week painful to walk no h/o gout/trauma nothing tried other than tylenol   Review of Systems  Constitutional: Positive for weight loss.       Down 30 lbs trying and Ofev causes reduced appetite    Cardiovascular: Negative for chest pain.  Musculoskeletal: Positive for joint pain. Negative for falls.   Past Medical History:  Diagnosis Date  . Atherosclerosis of abdominal aorta ([place])   . CAD (coronary artery disease)   . Cervical spondylosis 10/01/2013  . Chronic diastolic CHF (congestive heart failure) ([place])    a. 07/2016 Echo: >55%; b. 10/2016 Echo: EF 55-60%, Gr1 DD, Ao sclerosis w/o stenosis, sev dil LA; c. 08/2017 Echo: EF 60-65%, no rwma, Gr2 DD, mild AS, sev dil LA/RA.  [name] Coronary artery disease    a. 1998 s/p mini-cabg @ [facility] - LIMA->LAD;  b. 07/2016 St Echo: Inadequate HR w/ HTN response;  c.  08/2016 MV: EF 67%, no ischemia; d. 10/2016 NSTEMI/Cath: RCA 95p (4.0x26 Onyx DES), LIMA->LAD nl; e. 09/2017 Cath: LM 40/30, LAD 100ost, RI 80, LCX nl, OM2/3 nl, RCA patent stent, 67m LIMA->LAD nl-->Med Rx.  . DDD (degenerative disc disease), cervical   . DDD (degenerative disc disease), lumbar   . Depression   . Gait abnormality 07/31/2019  . GERD (gastroesophageal reflux disease)   . Hyperlipidemia   . Hypertension   . Hypothyroidism   . PAF (paroxysmal atrial fibrillation) (H[facility])    a. s/p DCCV-->maintaining sinus on amiodarone;  b. CHA2DS2VASc = 5-->eliquis.  . Parkinson's disease ([place]    tremors  . Pleural effusion, right    a. 09/2017 s/p thoracentesis.  .Marland KitchenPNA (pneumonia) 08/26/2017  . [place]monary [place]ism ([place]  . Pulmonary fibrosis ([place]   . 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 Sleep apnea    wears CPAP  .  Thrombocytopenia ([place]    Past Surgical History:  Procedure Laterality Date  . BACK SURGERY  1960  . CARDIAC CATHETERIZATION    . CHOLECYSTECTOMY  2010  . COLONOSCOPY WITH PROPOFOL N/A 06/07/2018   Procedure: COLONOSCOPY WITH PROPOFOL;  Surgeon: SLollie Sails MD;  [facility]  Service: Endoscopy;  Laterality: N/A;  . CORONARY ARTERY BYPASS GRAFT  01/07/1997  . CORONARY STENT INTERVENTION N/A 10/31/2016   Procedure: Coronary Stent Intervention;  Surgeon: AWellington Hampshire MD;  [facility];  Service: Cardiovascular;  Laterality: N/A;  . ELECTROPHYSIOLOGIC STUDY N/A 07/14/2015   Procedure: CARDIOVERSION;  Surgeon: DYolonda Kida MD;  Location: [facility] OR[facility];  Service: Cardiovascular;  Laterality: N/A;  . ELECTROPHYSIOLOGIC STUDY N/A 10/12/2015   Procedure: CARDIOVERSION;  Surgeon: TMinna Merritts MD;  Location: [facility] ORS;  Service: Cardiovascular;  Laterality: N/A;  . LEFT HEART CATH AND CORONARY ANGIOGRAPHY N/A 10/31/2016   Procedure: Left Heart Cath and Coronary Angiography;  Surgeon: AWellington Hampshire MD;  [facility];  Service: Cardiovascular;  Laterality: N/A;  . OTHER SURGICAL HISTORY  1998   Bypass  . RIGHT/LEFT HEART CATH AND CORONARY ANGIOGRAPHY N/A 09/18/2017   Procedure: RIGHT/LEFT HEART CATH AND CORONARY ANGIOGRAPHY;  Surgeon: AWellington Hampshire MD;  [facility];  Service: Cardiovascular;  Laterality: N/A;   Family History  Problem Relation Age of Onset  . Alcohol abuse Father    Social History   Socioeconomic History  . Marital status: Married    Spouse name: [name]  . Number of children: 1  . Years of education: 60  . Highest education level: Not on file  Occupational History  . Occupation: Retired    Comment: Designer, television/film set  Tobacco Use  . Smoking status: Former Smoker    Packs/day: 1.00    Years: 10.00    Pack years: 10.00    Types: Cigarettes, Pipe, Cigars    Quit date: 04/18/1972     Years since quitting: 47.3  . Smokeless tobacco: Former Systems developer    Types: [place] date: 04/18/1972  Substance and Sexual Activity  . Alcohol use: Yes    Alcohol/week: 4.0 standard drinks    Types: 2 Cans of beer, 2 Shots of liquor per week    Comment: per 2 weeks   . Drug use: No  . Sexual activity: Yes  Other Topics Concern  . Not on file  Social History Narrative   Lives w/ wife   Caffeine use: none   Right-handed   Social Determinants of Health   Financial Resource Strain:   . Difficulty of Paying Living Expenses:   Food Insecurity:   . Worried About Charity fundraiser in the Last Year:   . Arboriculturist in the Last Year:   Transportation Needs:   . Film/video editor (Medical):   [name] Lack of Transportation (Non-Medical):   Physical Activity:   . Days of Exercise per Week:   . Minutes of Exercise per Session:   Stress:   . Feeling of Stress :   Social Connections:   . Frequency of Communication with Friends and Family:   . Frequency of Social Gatherings with Friends and Family:   . Attends Religious Services:   . Active Member of Clubs or Organizations:   . Attends Archivist Meetings:   [name] Marital Status:   Intimate Partner Violence:   . Fear of Current or Ex-Partner:   . Emotionally Abused:   [name] Physically Abused:   . Sexually Abused:    Current Meds  Medication Sig  . albuterol (PROVENTIL) (2.5 MG/3ML) 0.083% nebulizer solution USE ONE AMPULE(3ML) VIA NEBULIZER EVERY 4 HOURS AS NEEDED FOR WHEEZING OR SHORTNESS OF BREATH  . buPROPion (WELLBUTRIN XL) 150 MG 24 hr tablet TAKE 1 TABLET BY MOUTH DAILY  . carbidopa-levodopa (SINEMET IR) 25-250 MG tablet TAKE TWO TABLETS 3 TIMES DAILY  . carvedilol (COREG) 3.125 MG tablet TAKE TWO TABLETS TWICE A DAY WITH MEALS  . ELIQUIS 5 MG TABS tablet TAKE ONE TABLET BY MOUTH TWICE DAILY  . entacapone (COMTAN) 200 MG tablet Take 1 tablet (200 mg total) by mouth 3 (three) times daily.  [name] esomeprazole (NEXIUM)  20 MG capsule Take 48 mg by mouth daily at 12 noon.   . fluticasone (FLONASE) 50 MCG/ACT nasal spray TAKE 2 PUFFS IN EACH NOSTRIL DAILY  . furosemide (LASIX) 20 MG tablet TAKE TWO TABLETS TWICE A DAY  . HYDROcodone-acetaminophen (NORCO/VICODIN) 5-325 MG tablet Take by mouth.  . isosorbide mononitrate (IMDUR) 30 MG 24 hr tablet TAKE ONE TABLET BY MOUTH EVERY DAY  . ketoconazole (NIZORAL) 2 % cream   . ketorolac (ACULAR) 0.5 % ophthalmic solution SMARTSIG:1 Drop(s) In Eye(s) Every 4-6 Hours PRN  . levothyroxine (SYNTHROID) 50  MCG tablet TAKE ONE TABLET ON AN EMPTY STOMACH WITHA GLASS OF WATER AT LEAST 30 TO 60 MINUTES BEFORE BREAKFAST  . losartan (COZAAR) 100 MG tablet Take 1 tablet (100 mg total) by mouth daily.  . mometasone (ELOCON) 0.1 % cream   . MYRBETRIQ 50 MG TB24 tablet TAKE ONE TABLET EVERY DAY  . Nintedanib Esylate (OFEV PO) Take by mouth.  . OFEV 150 MG CAPS TAKE 1 CAPSULE TWICE A DAY  . potassium chloride SA (KLOR-CON) 20 MEQ tablet TAKE ONE TABLET EVERY DAY  . Respiratory Therapy Supplies (FLUTTER) DEVI 1 Device by Does not apply route daily.  . rosuvastatin (CRESTOR) 10 MG tablet TAKE 1 TABLET BY MOUTH DAILY  . Tiotropium Bromide-Olodaterol (STIOLTO RESPIMAT) 2.5-2.5 MCG/ACT AERS Inhale 2 puffs into the lungs daily.   Allergies  Allergen Reactions  . Pravastatin Other (See Comments)  . Prednisone Other (See Comments)    Pt states that med makes him hyper Pt states that med makes him hyper   Recent Results (from the past 2160 hour(s))  CBC with Differential     Status: Abnormal   Collection Time: 06/10/19  1:50 PM  Result Value Ref Range   WBC 10.7 (H) 4.0 - 10.5 K/uL    Comment: WHITE COUNT CONFIRMED ON SMEAR   RBC 4.85 4.22 - 5.81 MIL/uL   Hemoglobin 14.2 13.0 - 17.0 g/dL   HCT 44.9 39.0 - 52.0 %   MCV 92.6 80.0 - 100.0 fL   MCH 29.3 26.0 - 34.0 pg   MCHC 31.6 30.0 - 36.0 g/dL   RDW 14.6 11.5 - 15.5 %   Platelets 119 (L) 150 - 400 K/uL   nRBC 0.0 0.0 - 0.2 %    Neutrophils Relative % 71 %   Neutro Abs 7.7 1.7 - 7.7 K/uL   Lymphocytes Relative 18 %   Lymphs Abs 1.9 0.7 - 4.0 K/uL   Monocytes Relative 8 %   Monocytes Absolute 0.9 0.1 - 1.0 K/uL   Eosinophils Relative 1 %   Eosinophils Absolute 0.1 0.0 - 0.5 K/uL   Basophils Relative 1 %   Basophils Absolute 0.1 0.0 - 0.1 K/uL   WBC Morphology DIFF CONFIRMED BY MANUAL    RBC Morphology UNREMARKABLE    Smear Review PLATELETS APPEAR DECREASED     Comment: PLATELETS VARY IN SIZE WITH FEW LARGE BODY PLATELETS NOTED   Immature Granulocytes 1 %   Abs Immature Granulocytes 0.05 0.00 - 0.07 K/uL    Comment: Performed at [facility], [address] [place],  [address]  Comprehensive metabolic panel     Status: Abnormal   Collection Time: 06/10/19  1:50 PM  Result Value Ref Range   Sodium 137 135 - 145 mmol/L   Potassium 4.1 3.5 - 5.1 mmol/L   Chloride 102 98 - 111 mmol/L   CO2 25 22 - 32 mmol/L   Glucose, Bld 120 (H) 70 - 99 mg/dL   BUN 17 8 - 23 mg/dL   Creatinine, Ser 0.95 0.61 - 1.24 mg/dL   Calcium 8.2 (L) 8.9 - 10.3 mg/dL   Total Protein 6.2 (L) 6.5 - 8.1 g/dL   Albumin 3.4 (L) 3.5 - 5.0 g/dL   AST 17 15 - 41 U/L   ALT 6 0 - 44 U/L   Alkaline Phosphatase 76 38 - 126 U/L   Total Bilirubin 1.0 0.3 - 1.2 mg/dL   GFR calc non Af Amer >60 >60 mL/min   GFR calc Af Amer >  60 >60 mL/min   Anion gap 10 5 - 15    Comment: Performed at [facility], [address] [place], [place] [address]  Sedimentation rate     Status: None   Collection Time: 07/11/19 10:38 AM  Result Value Ref Range   Sed Rate 18 0 - 20 mm/hr  CK (Creatine Kinase)     Status: None   Collection Time: 07/11/19 10:38 AM  Result Value Ref Range   Total CK 41 7 - 232 U/L  Comp Met (CMET)     Status: Abnormal   Collection Time: 07/11/19 10:38 AM  Result Value Ref Range   Sodium 137 135 - 145 mEq/L   Potassium 3.4 (L) 3.5 - 5.1 mEq/L   Chloride 101 96 - 112 mEq/L   CO2 28 19 - 32 mEq/L   Glucose, Bld  167 (H) 70 - 99 mg/dL   BUN 19 6 - 23 mg/dL   Creatinine, Ser 0.98 0.40 - 1.50 mg/dL   Total Bilirubin 1.1 0.2 - 1.2 mg/dL   Alkaline Phosphatase 81 39 - 117 U/L   AST 13 0 - 37 U/L   ALT 7 0 - 53 U/L   Total Protein 6.0 6.0 - 8.3 g/dL   Albumin 3.7 3.5 - 5.2 g/dL   GFR 73.29 >60.00 mL/min   Calcium 9.1 8.4 - 10.5 mg/dL  Sedimentation rate     Status: None   Collection Time: 07/31/19  8:23 AM  Result Value Ref Range   Sed Rate 28 0 - 30 mm/hr  C-reactive protein     Status: None   Collection Time: 07/31/19  8:23 AM  Result Value Ref Range   CRP 3 0 - 10 mg/L  SARS CORONAVIRUS 2 (TAT 6-24 HRS) Nasopharyngeal Nasopharyngeal Swab     Status: None   Collection Time: 08/06/19 11:14 AM   Specimen: Nasopharyngeal Swab  Result Value Ref Range   SARS Coronavirus 2 NEGATIVE NEGATIVE    Comment: (NOTE) SARS-CoV-2 target nucleic acids are NOT DETECTED. The SARS-CoV-2 RNA is generally detectable in upper and lower respiratory specimens during the acute phase of infection. Negative results do not preclude SARS-CoV-2 infection, do not rule out co-infections with other pathogens, and should not be used as the sole basis for treatment or other patient management decisions. Negative results must be combined with clinical observations, patient history, and epidemiological information. The expected result is Negative. Fact Sheet for Patients: https://www.fda.gov/media/138098/download Fact Sheet for Healthcare Providers: https://www.fda.gov/media/138095/download This test is not yet approved or cleared by the [place] FDA and  has been authorized for detection and/or diagnosis of SARS-CoV-2 by FDA under an Emergency Use Authorization (EUA). This EUA will remain  in effect (meaning this test can be used) for the duration of the COVID-19 declaration under Section 56 4(b)(1) of the Act, 21 U.S.C. section 360bbb-3(b)(1), unless the authorization is terminated or revoked sooner. Performed at  [facility], [address] [place], [place] [address]   Pulmonary function test     Status: None (Preliminary result)   Collection Time: 08/09/19 12:03 PM  Result Value Ref Range   FVC-Pre 2.55 L   FVC-%Pred-Pre 73 %   FVC-Post 2.56 L   FVC-%Pred-Post 73 %   FVC-%Change-Post 0 %   FEV1-Pre 1.83 L   FEV1-%Pred-Pre 74 %   FEV1-Post 1.86 L   FEV1-%Pred-Post 75 %   FEV1-%Change-Post 1 %   FEV6-Pre 2.55 L   FEV6-%Pred-Pre 78 %   FEV6-Post   2.51 L   FEV6-%Pred-Post 77 %   FEV6-%Change-Post -1 %   Pre FEV1/FVC ratio 72 %   FEV1FVC-%Pred-Pre 100 %   Post FEV1/FVC ratio 73 %   FEV1FVC-%Change-Post 1 %   Pre FEV6/FVC Ratio 100 %   FEV6FVC-%Pred-Pre 108 %   Post FEV6/FVC ratio 100 %   FEV6FVC-%Pred-Post 108 %   FEF 25-75 Pre 1.20 L/sec   FEF2575-%Pred-Pre 72 %   FEF 25-75 Post 1.31 L/sec   FEF2575-%Pred-Post 79 %   FEF2575-%Change-Post 9 %   RV 1.38 L   RV % pred 54 %   TLC 3.91 L   TLC % pred 60 %   DLCO unc 17.19 ml/min/mmHg   DLCO unc % pred 77 %   DLCO cor 17.19 ml/min/mmHg   DLCO cor % pred 77 %   DL/VA 4.42 ml/min/mmHg/L   DL/VA % pred 112 %   Objective  Body mass index is 38.53 kg/m. Wt Readings from Last 3 Encounters:  08/21/19 246 lb (111.6 kg)  07/31/19 252 lb (114.3 kg)  07/25/19 252 lb (114.3 kg)   Temp Readings from Last 3 Encounters:  08/21/19 (!) 95.3 F (35.2 C) (Temporal)  07/31/19 98.2 F (36.8 C)  07/25/19 (!) 97.1 F (36.2 C) (Temporal)   BP Readings from Last 3 Encounters:  08/21/19 124/70  07/31/19 137/65  07/25/19 124/70   Pulse Readings from Last 3 Encounters:  08/21/19 (!) 58  07/31/19 62  07/25/19 63    Physical Exam Vitals and nursing note reviewed.  Constitutional:      Appearance: Normal appearance. He is well-developed and well-groomed. He is obese.  HENT:     Head: Normocephalic and atraumatic.  Eyes:     Conjunctiva/sclera: Conjunctivae normal.     Pupils: Pupils are equal, round, and reactive to light.   Cardiovascular:     Rate and Rhythm: Normal rate and regular rhythm.     Heart sounds: Normal heart sounds. No murmur.  Pulmonary:     Effort: Pulmonary effort is normal.     Breath sounds: Normal breath sounds.  Musculoskeletal:     Comments: Right great toe and 2nd toe warm and ttp MTP and red  Esp sole right foot   Skin:    General: Skin is warm and dry.  Neurological:     General: No focal deficit present.     Mental Status: He is alert and oriented to person, place, and time. Mental status is at baseline.     Gait: Gait normal.  Psychiatric:        Attention and Perception: Attention and perception normal.        Mood and Affect: Mood and affect normal.        Speech: Speech normal.        Behavior: Behavior normal. Behavior is cooperative.        Thought Content: Thought content normal.        Cognition and Memory: Cognition and memory normal.        Judgment: Judgment normal.     Assessment  Plan  Pain of toe of right foot - Plan: CBC with Differential/Platelet, Uric acid, Basic Metabolic Panel (BMET), VITAMIN D 25 Hydroxy (Vit-D Deficiency, Fractures), Magnesium, DG Foot Complete Right, predniSONE (DELTASONE) 20 MG tablet Consider allopurinol, colchicine in the future  Declines narcotics  depomedrol 40 x 1  Prednisone 20 mg x 7-10 days   Hypokalemia - Plan: Basic Metabolic Panel (BMET), Magnesium  Prediabetes - Plan: Hemoglobin   A1c  Pulmonary fibrosis (HCC) - Plan: Nintedanib Esylate (OFEV PO) per pulm  Provider: Dr.  McLean-Scocuzza-Internal Medicine  

## 2019-08-21 NOTE — Addendum Note (Signed)
Addended by: Thressa Sheller on: 08/21/2019 02:53 PM   Modules accepted: Orders

## 2019-08-21 NOTE — Telephone Encounter (Signed)
Sent to pharmacy 

## 2019-08-21 NOTE — Telephone Encounter (Signed)
Patient requesting this medication be sent in as a RX as this will be cheaper than OTC. Please advise

## 2019-08-21 NOTE — Addendum Note (Signed)
Addended by: Orland Mustard on: 08/21/2019 03:06 PM   Modules accepted: Orders

## 2019-08-21 NOTE — Patient Instructions (Addendum)
Tylenol for pain arthritis  Voltaren gel 4x per day   Prediabetes Eating Plan Prediabetes is a condition that causes blood sugar (glucose) levels to be higher than normal. This increases the risk for developing diabetes. In order to prevent diabetes from developing, your health care provider may recommend a diet and other lifestyle changes to help you:  Control your blood glucose levels.  Improve your cholesterol levels.  Manage your blood pressure. Your health care provider may recommend working with a diet and nutrition specialist (dietitian) to make a meal plan that is best for you. What are tips for following this plan? Lifestyle  Set weight loss goals with the help of your health care team. It is recommended that most people with prediabetes lose 7% of their current body weight.  Exercise for at least 30 minutes at least 5 days a week.  Attend a support group or seek ongoing support from a mental health counselor.  Take over-the-counter and prescription medicines only as told by your health care provider. Reading food labels  Read food labels to check the amount of fat, salt (sodium), and sugar in prepackaged foods. Avoid foods that have: ? Saturated fats. ? Trans fats. ? Added sugars.  Avoid foods that have more than 300 milligrams (mg) of sodium per serving. Limit your daily sodium intake to less than 2,300 mg each day. Shopping  Avoid buying pre-made and processed foods. Cooking  Cook with olive oil. Do not use butter, lard, or ghee.  Bake, broil, grill, or boil foods. Avoid frying. Meal planning   Work with your dietitian to develop an eating plan that is right for you. This may include: ? Tracking how many calories you take in. Use a food diary, notebook, or mobile application to track what you eat at each meal. ? Using the glycemic index (GI) to plan your meals. The index tells you how quickly a food will raise your blood glucose. Choose low-GI foods. These foods  take a longer time to raise blood glucose.  Consider following a Mediterranean diet. This diet includes: ? Several servings each day of fresh fruits and vegetables. ? Eating fish at least twice a week. ? Several servings each day of whole grains, beans, nuts, and seeds. ? Using olive oil instead of other fats. ? Moderate alcohol consumption. ? Eating small amounts of red meat and whole-fat dairy.  If you have high blood pressure, you may need to limit your sodium intake or follow a diet such as the DASH eating plan. DASH is an eating plan that aims to lower high blood pressure. What foods are recommended? The items listed below may not be a complete list. Talk with your dietitian about what dietary choices are best for you. Grains Whole grains, such as whole-wheat or whole-grain breads, crackers, cereals, and pasta. Unsweetened oatmeal. Bulgur. Barley. Quinoa. Brown rice. Corn or whole-wheat flour tortillas or taco shells. Vegetables Lettuce. Spinach. Peas. Beets. Cauliflower. Cabbage. Broccoli. Carrots. Tomatoes. Squash. Eggplant. Herbs. Peppers. Onions. Cucumbers. Brussels sprouts. Fruits Berries. Bananas. Apples. Oranges. Grapes. Papaya. Mango. Pomegranate. Kiwi. Grapefruit. Cherries. Meats and other protein foods Seafood. Poultry without skin. Lean cuts of pork and beef. Tofu. Eggs. Nuts. Beans. Dairy Low-fat or fat-free dairy products, such as yogurt, cottage cheese, and cheese. Beverages Water. Tea. Coffee. Sugar-free or diet soda. Seltzer water. Lowfat or no-fat milk. Milk alternatives, such as soy or almond milk. Fats and oils Olive oil. Canola oil. Sunflower oil. Grapeseed oil. Avocado. Walnuts. Sweets and desserts Sugar-free  or low-fat pudding. Sugar-free or low-fat ice cream and other frozen treats. Seasoning and other foods Herbs. Sodium-free spices. Mustard. Relish. Low-fat, low-sugar ketchup. Low-fat, low-sugar barbecue sauce. Low-fat or fat-free mayonnaise. What foods  are not recommended? The items listed below may not be a complete list. Talk with your dietitian about what dietary choices are best for you. Grains Refined white flour and flour products, such as bread, pasta, snack foods, and cereals. Vegetables Canned vegetables. Frozen vegetables with butter or cream sauce. Fruits Fruits canned with syrup. Meats and other protein foods Fatty cuts of meat. Poultry with skin. Breaded or fried meat. Processed meats. Dairy Full-fat yogurt, cheese, or milk. Beverages Sweetened drinks, such as sweet iced tea and soda. Fats and oils Butter. Lard. Ghee. Sweets and desserts Baked goods, such as cake, cupcakes, pastries, cookies, and cheesecake. Seasoning and other foods Spice mixes with added salt. Ketchup. Barbecue sauce. Mayonnaise. Summary  To prevent diabetes from developing, you may need to make diet and other lifestyle changes to help control blood sugar, improve cholesterol levels, and manage your blood pressure.  Set weight loss goals with the help of your health care team. It is recommended that most people with prediabetes lose 7 percent of their current body weight.  Consider following a Mediterranean diet that includes plenty of fresh fruits and vegetables, whole grains, beans, nuts, seeds, fish, lean meat, low-fat dairy, and healthy oils. This information is not intended to replace advice given to you by your health care provider. Make sure you discuss any questions you have with your health care provider. Document Revised: 07/27/2018 Document Reviewed: 06/08/2016 Elsevier Patient Education  [place] A low-purine eating plan involves making food choices to limit your intake of purine. Purine is a kind of uric acid. Too much uric acid in your blood can cause certain conditions, such as gout and kidney stones. Eating a low-purine diet can help control these conditions. What are tips for following this  plan? Reading food labels   Avoid foods with saturated or Trans fat.  Check the ingredient list of grains-based foods, such as bread and cereal, to make sure that they contain whole grains.  Check the ingredient list of sauces or soups to make sure they do not contain meat or fish.  When choosing soft drinks, check the ingredient list to make sure they do not contain high-fructose corn syrup. Shopping  Buy plenty of fresh fruits and vegetables.  Avoid buying canned or fresh fish.  Buy dairy products labeled as low-fat or nonfat.  Avoid buying premade or processed foods. These foods are often high in fat, salt (sodium), and added sugar. Cooking  Use olive oil instead of butter when cooking. Oils like olive oil, canola oil, and sunflower oil contain healthy fats. Meal planning  Learn which foods do or do not affect you. If you find out that a food tends to cause your gout symptoms to flare up, avoid eating that food. You can enjoy foods that do not cause problems. If you have any questions about a food item, talk with your dietitian or health care provider.  Limit foods high in fat, especially saturated fat. Fat makes it harder for your body to get rid of uric acid.  Choose foods that are lower in fat and are lean sources of protein. General guidelines  Limit alcohol intake to no more than 1 drink a day for nonpregnant women and 2 drinks a day for men. One drink  equals 12 oz of beer, 5 oz of wine, or 1 oz of hard liquor. Alcohol can affect the way your body gets rid of uric acid.  Drink plenty of water to keep your urine clear or pale yellow. Fluids can help remove uric acid from your body.  If directed by your health care provider, take a vitamin C supplement.  Work with your health care provider and dietitian to develop a plan to achieve or maintain a healthy weight. Losing weight can help reduce uric acid in your blood. What foods are recommended? The items listed may not be  a complete list. Talk with your dietitian about what dietary choices are best for you. Foods low in purines Foods low in purines do not need to be limited. These include:  All fruits.  All low-purine vegetables, pickles, and olives.  Breads, pasta, rice, cornbread, and popcorn. Cake and other baked goods.  All dairy foods.  Eggs, nuts, and nut butters.  Spices and condiments, such as salt, herbs, and vinegar.  Plant oils, butter, and margarine.  Water, sugar-free soft drinks, tea, coffee, and cocoa.  Vegetable-based soups, broths, sauces, and gravies. Foods moderate in purines Foods moderate in purines should be limited to the amounts listed.   cup of asparagus, cauliflower, spinach, mushrooms, or green peas, each day.  2/3 cup uncooked oatmeal, each day.   cup dry wheat bran or wheat germ, each day.  2-3 ounces of meat or poultry, each day.  4-6 ounces of shellfish, such as crab, lobster, oysters, or shrimp, each day.  1 cup cooked beans, peas, or lentils, each day.  Soup, broths, or bouillon made from meat or fish. Limit these foods as much as possible. What foods are not recommended? The items listed may not be a complete list. Talk with your dietitian about what dietary choices are best for you. Limit your intake of foods high in purines, including:  Beer and other alcohol.  Meat-based gravy or sauce.  Canned or fresh fish, such as: ? Anchovies, sardines, herring, and tuna. ? Mussels and scallops. ? Codfish, trout, and haddock.  Bern[place] Salines.  Organ meats, such as: ? Liver or kidney. ? Tripe. ? Sweetbreads (thymus gland or pancreas).  Wild [employer] biochemist.  Yeast or yeast extract supplements.  Drinks sweetened with high-fructose corn syrup. Summary  Eating a low-purine diet can help control conditions caused by too much uric acid in the body, such as gout or kidney stones.  Choose low-purine foods, limit alcohol, and limit foods high in fat.  You will  learn over time which foods do or do not affect you. If you find out that a food tends to cause your gout symptoms to flare up, avoid eating that food. This information is not intended to replace advice given to you by your health care provider. Make sure you discuss any questions you have with your health care provider. Document Revised: 03/17/2017 Document Reviewed: 05/18/2016 Elsevier Patient Education  [place].  Gout  Gout is a condition that causes painful swelling of the joints. Gout is a type of inflammation of the joints (arthritis). This condition is caused by having too much uric acid in the body. Uric acid is a chemical that forms when the body breaks down substances called purines. Purines are important for building body proteins. When the body has too much uric acid, sharp crystals can form and build up inside the joints. This causes pain and swelling. Gout attacks can happen quickly and  may be very painful (acute gout). Over time, the attacks can affect more joints and become more frequent (chronic gout). Gout can also cause uric acid to build up under the skin and inside the kidneys. What are the causes? This condition is caused by too much uric acid in your blood. This can happen because:  Your kidneys do not remove enough uric acid from your blood. This is the most common cause.  Your body makes too much uric acid. This can happen with some cancers and cancer treatments. It can also occur if your body is breaking down too many red blood cells (hemolytic anemia).  You eat too many foods that are high in purines. These foods include organ meats and some seafood. Alcohol, especially beer, is also high in purines. A gout attack may be triggered by trauma or stress. What increases the risk? You are more likely to develop this condition if you:  Have a family history of gout.  Are male and middle-aged.  Are male and have gone through menopause.  Are  obese.  Frequently drink alcohol, especially beer.  Are dehydrated.  Lose weight too quickly.  Have an organ transplant.  Have lead poisoning.  Take certain medicines, including aspirin, cyclosporine, diuretics, levodopa, and niacin.  Have kidney disease.  Have a skin condition called psoriasis. What are the signs or symptoms? An attack of acute gout happens quickly. It usually occurs in just one joint. The most common place is the big toe. Attacks often start at night. Other joints that may be affected include joints of the feet, ankle, knee, fingers, wrist, or elbow. Symptoms of this condition may include:  Severe pain.  Warmth.  Swelling.  Stiffness.  Tenderness. The affected joint may be very painful to touch.  Shiny, red, or purple skin.  Chills and fever. Chronic gout may cause symptoms more frequently. More joints may be involved. You may also have white or yellow lumps (tophi) on your hands or feet or in other areas near your joints. How is this diagnosed? This condition is diagnosed based on your symptoms, medical history, and physical exam. You may have tests, such as:  Blood tests to measure uric acid levels.  Removal of joint fluid with a thin needle (aspiration) to look for uric acid crystals.  X-rays to look for joint damage. How is this treated? Treatment for this condition has two phases: treating an acute attack and preventing future attacks. Acute gout treatment may include medicines to reduce pain and swelling, including:  NSAIDs.  Steroids. These are strong anti-inflammatory medicines that can be taken by mouth (orally) or injected into a joint.  Colchicine. This medicine relieves pain and swelling when it is taken soon after an attack. It can be given by mouth or through an IV. Preventive treatment may include:  Daily use of smaller doses of NSAIDs or colchicine.  Use of a medicine that reduces uric acid levels in your blood.  Changes to  your diet. You may need to see a dietitian about what to eat and drink to prevent gout. Follow these instructions at home: During a gout attack   If directed, put ice on the affected area: ? Put ice in a plastic bag. ? Place a towel between your skin and the bag. ? Leave the ice on for 20 minutes, 2-3 times a day.  Raise (elevate) the affected joint above the level of your heart as often as possible.  Rest the joint as much as possible.  If the affected joint is in your leg, you may be given crutches to use.  Follow instructions from your health care provider about eating or drinking restrictions. Avoiding future gout attacks  Follow a low-purine diet as told by your dietitian or health care provider. Avoid foods and drinks that are high in purines, including liver, kidney, anchovies, asparagus, herring, mushrooms, mussels, and beer.  Maintain a healthy weight or lose weight if you are overweight. If you want to lose weight, talk with your health care provider. It is important that you do not lose weight too quickly.  Start or maintain an exercise program as told by your health care provider. Eating and drinking  Drink enough fluids to keep your urine pale yellow.  If you drink alcohol: ? Limit how much you use to:  0-1 drink a day for women.  0-2 drinks a day for men. ? Be aware of how much alcohol is in your drink. In the [place] one drink equals one 12 oz bottle of beer (355 mL) one 5 oz glass of wine (148 mL), or one 1 oz glass of hard liquor (44 mL). General instructions  Take over-the-counter and prescription medicines only as told by your health care provider.  Do not drive or use heavy machinery while taking prescription pain medicine.  Return to your normal activities as told by your health care provider. Ask your health care provider what activities are safe for you.  Keep all follow-up visits as told by your health care provider. This is important. Contact a health  care provider if you have:  Another gout attack.  Continuing symptoms of a gout attack after 10 days of treatment.  Side effects from your medicines.  Chills or a fever.  Burning pain when you urinate.  Pain in your lower back or belly. Get help right away if you:  Have severe or uncontrolled pain.  Cannot urinate. Summary  Gout is painful swelling of the joints caused by inflammation.  The most common site of pain is the big toe, but it can affect other joints in the body.  Medicines and dietary changes can help to prevent and treat gout attacks. This information is not intended to replace advice given to you by your health care provider. Make sure you discuss any questions you have with your health care provider. Document Revised: 10/25/2017 Document Reviewed: 10/25/2017 Elsevier Patient Education  [place].

## 2019-08-22 ENCOUNTER — Telehealth: Payer: Self-pay | Admitting: Internal Medicine

## 2019-08-22 ENCOUNTER — Encounter (HOSPITAL_COMMUNITY)
Admission: RE | Admit: 2019-08-22 | Discharge: 2019-08-22 | Disposition: A | Discharge status: Home / Self Care | Payer: PPO | Source: Ambulatory Visit | Attending: Neurology | Admitting: Neurology

## 2019-08-22 DIAGNOSIS — G20 Parkinson's disease: Secondary | ICD-10-CM | POA: Insufficient documentation

## 2019-08-22 DIAGNOSIS — R269 Unspecified abnormalities of gait and mobility: Secondary | ICD-10-CM | POA: Diagnosis not present

## 2019-08-22 MED ORDER — IODINE STRONG (LUGOLS) 5 % PO SOLN
0.8000 mL | Freq: Once | ORAL | Status: AC
  Administered 2019-08-22: 0.8 mL via ORAL

## 2019-08-22 MED ORDER — IOFLUPANE I 123 185 MBQ/2.5ML IV SOLN
4.6600 | Freq: Once | INTRAVENOUS | Status: AC
  Administered 2019-08-22: 4.66 via INTRAVENOUS

## 2019-08-22 NOTE — Telephone Encounter (Signed)
Patient informed and verbalized understanding.  Pt declines referral at this time as he is doing better. Patient will call back in if he changes his mind.   Dr Olivia Mackie informed.  For your information Dr [name]

## 2019-08-22 NOTE — Telephone Encounter (Addendum)
-----   Message from Delorise Jackson, MD sent at 08/22/2019  8:17 AM EDT ----- No arthritis seen but heel spur  For toe pain I would consider vascular referral to check circulation in lower leg and feet  Is he agreeable?   No arthritis seen but heel spur  For toe pain I would consider vascular referral to check circulation in lower leg and feet  Is he agreeable? I was concerned with gout but uric acid low but this could be in acute flare given depomedrol 40 and prednisone low dose he was agreeable to try this but advised of side effects

## 2019-08-22 NOTE — Telephone Encounter (Signed)
I called and LVM informing the patient that his nexium was sent to pharmacy.  Canna Nickelson,cma

## 2019-08-22 NOTE — Telephone Encounter (Signed)
-----   Message from Delorise Jackson, MD sent at 08/21/2019  5:23 PM EDT ----- Gout # is not that elevated uric acid but sometimes if you have a gout flare this # can be reduced  Will await Xray right foot Potassium improved  Magnesium normal   White blood ct slightly elevated and platelets low appears you see hematology for this  Vitamin D is low rec. Vitamin D3 4000 IU daily over the counter   Prediabetes A1C 6.0 continue healthy diet choices and weight loss down from 6.4

## 2019-08-22 NOTE — Telephone Encounter (Signed)
Left message to return call 

## 2019-08-23 ENCOUNTER — Telehealth: Payer: Self-pay | Admitting: Neurology

## 2019-08-23 NOTE — Telephone Encounter (Signed)
I called the patient. DaTscan shows findings consistent with Parkinson's disease, he will stay on his current medication regimen therefore.   DAT scan 08/22/19:  IMPRESSION: Absent activity in the LEFT and RIGHT posterior striatum. Findings indicate loss of dopamine transport populations in a pattern typical of Parkinson's syndrome pathology.

## 2019-08-26 ENCOUNTER — Telehealth: Payer: Self-pay | Admitting: Pulmonary Disease

## 2019-08-26 ENCOUNTER — Other Ambulatory Visit: Payer: Self-pay | Admitting: Cardiovascular Disease

## 2019-08-26 NOTE — Telephone Encounter (Signed)
Pt's age 84, wt 111.6 kg, SCr 0.81, CrCl 112.9, last ov w/ MA 03/06/19.

## 2019-08-26 NOTE — Telephone Encounter (Signed)
Request PFT results ordered by Dr. Vaughan Browner

## 2019-08-26 NOTE — Telephone Encounter (Signed)
Returned call and spoke with pt and spouse Relayed PFT results per PM's interpretation. Pt did not have any further questions.

## 2019-08-26 NOTE — Telephone Encounter (Signed)
PFTs show reduction in lung function secondary to COPD and interstitial lung disease Lung function is however overall improved compared to 2020. Continue current therapy

## 2019-08-26 NOTE — Telephone Encounter (Signed)
Refill Request.  

## 2019-09-02 IMAGING — CR DG CHEST 2V
3 series · 3 of 3 positions shown · non-contrast
Comparison: CT chest 05/30/2017.  Chest 10/28/2009

CLINICAL DATA: Respiratory distress over the last week but worse
over the last 3 days. Shortness of breath.

EXAM:
CHEST - 2 VIEW

[chest pa]
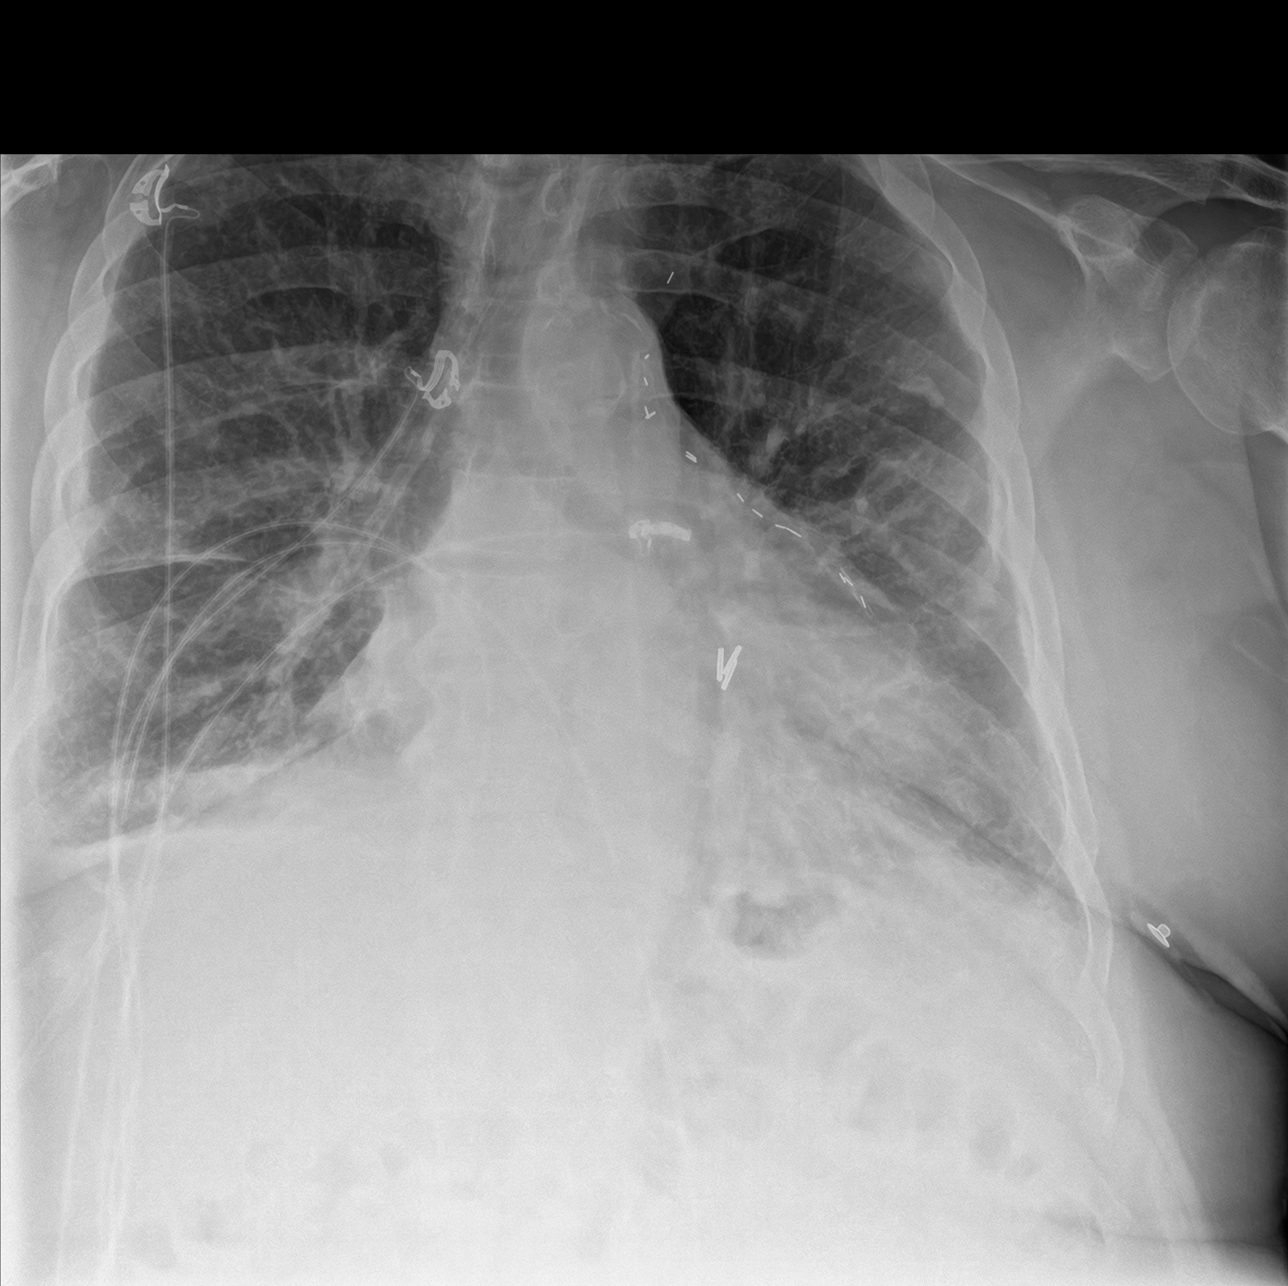

[chest lat]
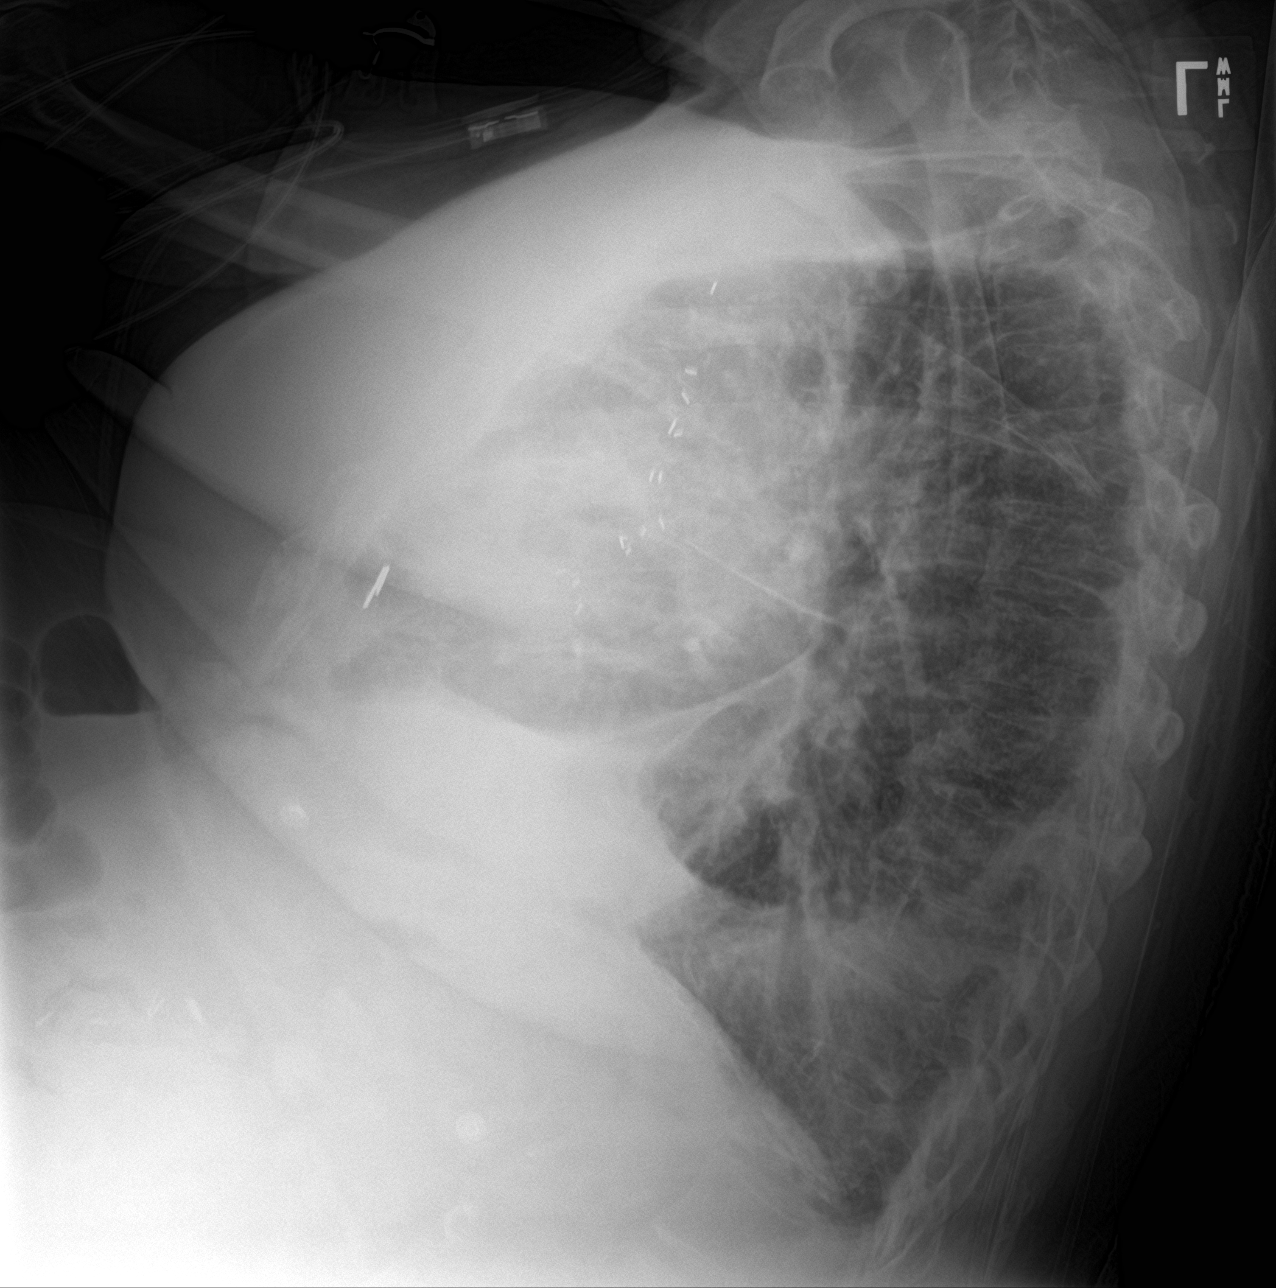

[chest ap]
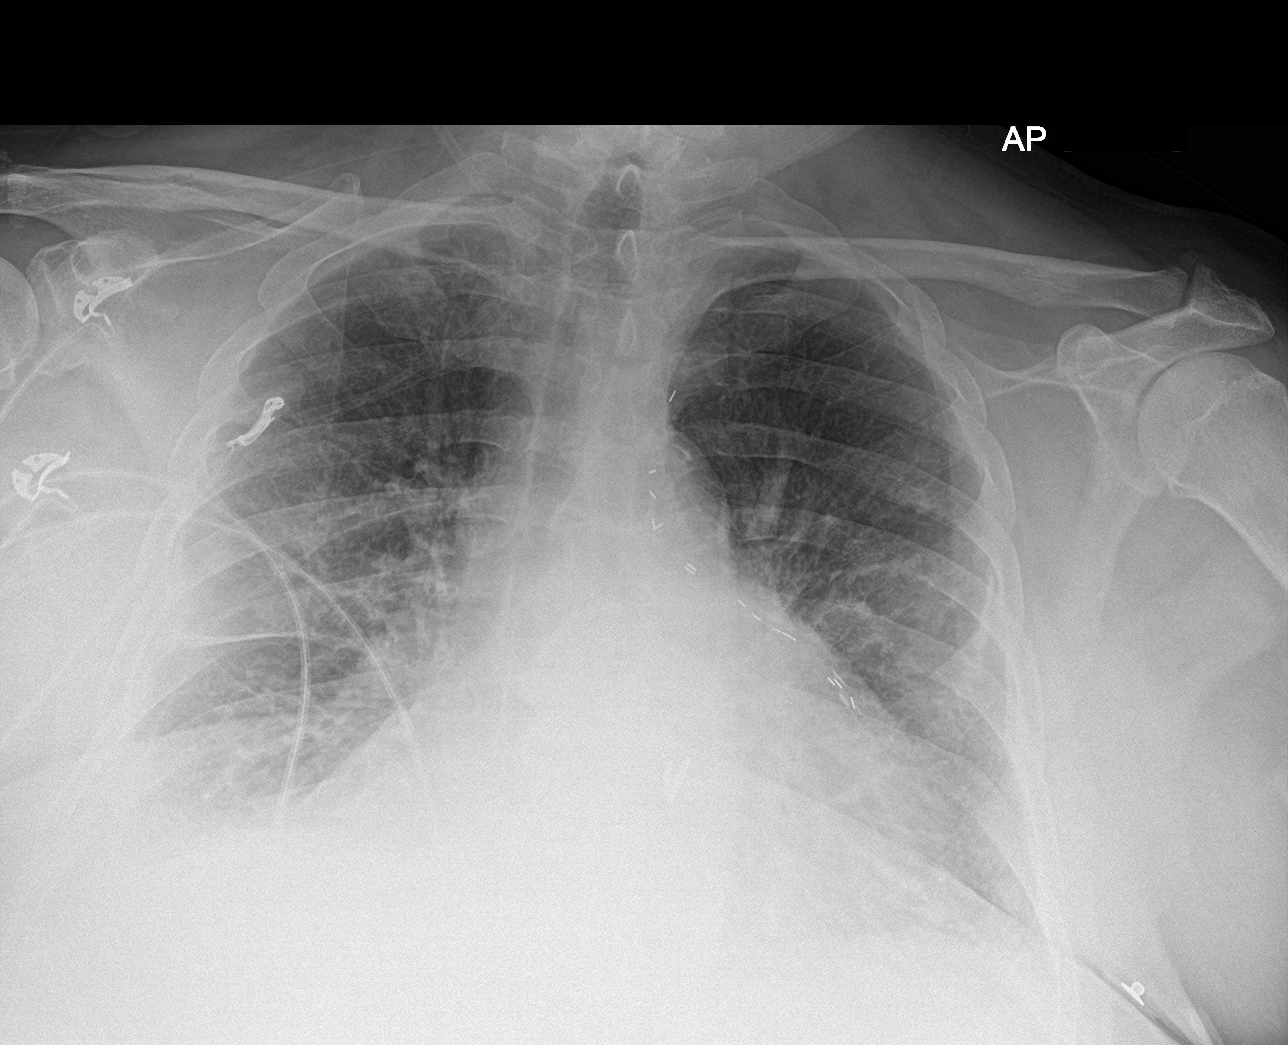

[3 of 3 positions shown; findings below may reference images not displayed]

FINDINGS: Cardiac enlargement. No pulmonary vascular congestion. Coarse
interstitial infiltrates with bronchial wall thickening
demonstrating progression since previous study. This is likely to
represent progressing fibrosis and bronchitic changes. Can't exclude
a component of interstitial edema superimposed on chronic fibrosis.
Small right pleural effusion with infiltration or atelectasis in the
right base. This could represent focal superimposed pneumonia. No
pneumothorax. Calcified pleural plaques. Calcification of the aorta.
Postoperative changes in the mediastinum.
IMPRESSION: 1. Chronic appearing coarse interstitial infiltrates with bronchitic
changes demonstrating progression since previous study.
2. Small right pleural effusion with infiltration or atelectasis in
the right lung base possibly representing superimposed focal
pneumonia.
3. Calcified pleural plaques. Combined with interstitial changes in
the lungs, this may indicate evidence of asbestosis.
4. Aortic atherosclerosis.

## 2019-09-05 ENCOUNTER — Other Ambulatory Visit: Payer: Self-pay | Admitting: Family Medicine

## 2019-09-05 DIAGNOSIS — J449 Chronic obstructive pulmonary disease, unspecified: Secondary | ICD-10-CM | POA: Diagnosis not present

## 2019-09-05 DIAGNOSIS — J441 Chronic obstructive pulmonary disease with (acute) exacerbation: Secondary | ICD-10-CM | POA: Diagnosis not present

## 2019-09-05 DIAGNOSIS — G4733 Obstructive sleep apnea (adult) (pediatric): Secondary | ICD-10-CM | POA: Diagnosis not present

## 2019-09-05 DIAGNOSIS — J45909 Unspecified asthma, uncomplicated: Secondary | ICD-10-CM | POA: Diagnosis not present

## 2019-09-10 ENCOUNTER — Ambulatory Visit (INDEPENDENT_AMBULATORY_CARE_PROVIDER_SITE_OTHER): Payer: PPO | Admitting: Cardiovascular Disease

## 2019-09-10 ENCOUNTER — Other Ambulatory Visit: Payer: Self-pay

## 2019-09-10 ENCOUNTER — Encounter: Payer: Self-pay | Admitting: Cardiovascular Disease

## 2019-09-10 VITALS — BP 126/76 | HR 81 | Ht 67.0 in | Wt 245.0 lb

## 2019-09-10 DIAGNOSIS — I480 Paroxysmal atrial fibrillation: Secondary | ICD-10-CM

## 2019-09-10 DIAGNOSIS — I5032 Chronic diastolic (congestive) heart failure: Secondary | ICD-10-CM

## 2019-09-10 DIAGNOSIS — I25119 Atherosclerotic heart disease of native coronary artery with unspecified angina pectoris: Secondary | ICD-10-CM

## 2019-09-10 DIAGNOSIS — I10 Essential (primary) hypertension: Secondary | ICD-10-CM

## 2019-09-10 NOTE — Patient Instructions (Signed)
Medication Instructions:  Your physician recommends that you continue on your current medications as directed. Please refer to the Current Medication list given to you today.  *If you need a refill on your cardiac medications before your next appointment, please call your pharmacy*   Lab Work: None ordered If you have labs (blood work) drawn today and your tests are completely normal, you will receive your results only by: [name] MyChart Message (if you have MyChart) OR . A paper copy in the mail If you have any lab test that is abnormal or we need to change your treatment, we will call you to review the results.   Testing/Procedures: None orderd   Follow-Up: At [facility], you and your health needs are our priority.  As part of our continuing mission to provide you with exceptional heart care, we have created designated Provider Care Teams.  These Care Teams include your primary Cardiologist (physician) and Advanced Practice Providers (APPs -  Physician Assistants and Nurse Practitioners) who all work together to provide you with the care you need, when you need it.  We recommend signing up for the patient portal called "MyChart".  Sign up information is provided on this After Visit Summary.  MyChart is used to connect with patients for Virtual Visits (Telemedicine).  Patients are able to view lab/test results, encounter notes, upcoming appointments, etc.  Non-urgent messages can be sent to your provider as well.   To learn more about what you can do with MyChart, go to [email or web address].    Your next appointment:   3 month(s)  The format for your next appointment:   In Person  Provider:    You may see [name], MD or one of the following Advanced Practice Providers on your designated Care Team:    Murray Hodgkins, NP  Christell Faith, PA-C  Marrianne Mood, PA-C    Other Instructions N/A

## 2019-09-10 NOTE — Progress Notes (Signed)
Cardiology Office Note   Date:  09/10/2019   ID:  CARDERO QUIZHPI, DOB 17-Aug-1935, MRN [id number]  PCP:  [name], MD  Cardiologist:   [name], MD   Chief Complaint  Patient presents with  . other    6 month follow up. patient c.o SOB. meds reviewed verbally with patient.       History of Present Illness: Andre [name] is a 84 y.o. male who presents for a follow-up visit regarding coronary artery disease.   He has known history of coronary artery disease status post 1 vessel CABG with LIMA to LAD in 1998, hypertension, hyperlipidemia, chronic diastolic heart aure, paroxysmal atrial fibrillation and morbid obesity. He also has history of pulmonary embolism and Parkinson's. He was hospitalized in July of 2018 with a small non-ST elevation myocardial infarction. Echocardiogram showed normal LV systolic function with severely dilated left atrium. Cardiac catheterization showed occluded native LAD with patent LIMA, severe proximal RCA stenosis with heavy thrombus and moderate distal left main stenosis with significant ostial stenosis in the ramus branch. EF was 50-55% with mildly elevated left ventricular end-diastolic pressure. I performed successful angioplasty and drug-eluting stent placement to the right coronary artery which was complicated by embolization into a large RV branch at the lesion site. This was treated with balloon angioplasty.  A right and left cardiac catheterization was done in June of 2019 due to exertional dyspnea which showed  patent LIMA to LAD and patent RCA stent.  There was significant ostial disease in ramus branch as well as first diagonal.  Both of these were unchanged from most recent cardiac catheterization.  Right heart catheterization showed only mildly elevated filling pressures, minimal pulmonary hypertension and normal cardiac output.  Based on that, I felt that his symptoms were mostly noncardiac. Shortness of breath was felt to be  multifactorial due to chronic diastolic heart failure, lung disease, physical deconditioning and possible allergies.    Pulmonary function testing was suggestive of restrictive physiology and CT scan showed early pulmonary fibrosis and COPD.  Due to these changes, we elected to discontinue amiodarone.    He is followed closely by pulmonary clinic for pulmonary fibrosis.  He has chronic exertional dyspnea related to this but denies chest pain.  He has been able to lose some weight over the last few months.  He takes his medications regularly. He is not to be in atrial fibrillation today but he denies palpitations.    Past Medical History:  Diagnosis Date  . Atherosclerosis of abdominal aorta ([place])   . CAD (coronary artery disease)   . Cervical spondylosis 10/01/2013  . Chronic diastolic CHF (congestive heart failure) ([place])    a. 07/2016 Echo: >55%; b. 10/2016 Echo: EF 55-60%, Gr1 DD, Ao sclerosis w/o stenosis, sev dil LA; c. 08/2017 Echo: EF 60-65%, no rwma, Gr2 DD, mild AS, sev dil LA/RA.  [name] Coronary artery disease    a. 1998 s/p mini-cabg @ [facility] - LIMA->LAD;  b. 07/2016 St Echo: Inadequate HR w/ HTN response;  c.  08/2016 MV: EF 67%, no ischemia; d. 10/2016 NSTEMI/Cath: RCA 95p (4.0x26 Onyx DES), LIMA->LAD nl; e. 09/2017 Cath: LM 40/30, LAD 100ost, RI 80, LCX nl, OM2/3 nl, RCA patent stent, 93m, LIMA->LAD nl-->Med Rx.  . DDD (degenerative disc disease), cervical   . DDD (degenerative disc disease), lumbar   . Depression   . Gait abnormality 07/31/2019  . GERD (gastroesophageal reflux disease)   . Hyperlipidemia   . Hypertension   .  Hypothyroidism   . PAF (paroxysmal atrial fibrillation) ([facility])    a. s/p DCCV-->maintaining sinus on amiodarone;  b. CHA2DS2VASc = 5-->eliquis.  . Parkinson's disease ([place])    tremors  . Pleural effusion, right    a. 09/2017 s/p thoracentesis.  [name] PNA (pneumonia) 08/26/2017  . Pulmonary embolism ([place]) 2011  . Pulmonary fibrosis ([place])   . Secondary erythrocytosis  01/28/2015  . Sleep apnea    wears CPAP  . Thrombocytopenia ([place])     Past Surgical History:  Procedure Laterality Date  . BACK SURGERY  1960  . CARDIAC CATHETERIZATION    . CHOLECYSTECTOMY  2010  . COLONOSCOPY WITH PROPOFOL N/A 06/07/2018   Procedure: COLONOSCOPY WITH PROPOFOL;  Surgeon: Lollie Sails, MD;  Location: [facility] ENDOSCOPY;  Service: Endoscopy;  Laterality: N/A;  . CORONARY ARTERY BYPASS GRAFT  01/07/1997  . CORONARY STENT INTERVENTION N/A 10/31/2016   Procedure: Coronary Stent Intervention;  Surgeon: Wellington Hampshire, MD;  Location: [place] CV LAB;  Service: Cardiovascular;  Laterality: N/A;  . ELECTROPHYSIOLOGIC STUDY N/A 07/14/2015   Procedure: CARDIOVERSION;  Surgeon: Yolonda Kida, MD;  Location: [facility] ORS;  Service: Cardiovascular;  Laterality: N/A;  . ELECTROPHYSIOLOGIC STUDY N/A 10/12/2015   Procedure: CARDIOVERSION;  Surgeon: Minna Merritts, MD;  Location: [facility] ORS;  Service: Cardiovascular;  Laterality: N/A;  . LEFT HEART CATH AND CORONARY ANGIOGRAPHY N/A 10/31/2016   Procedure: Left Heart Cath and Coronary Angiography;  Surgeon: Wellington Hampshire, MD;  Location: [place] [facility] LAB;  Service: Cardiovascular;  Laterality: N/A;  . OTHER SURGICAL HISTORY  1998   Bypass  . RIGHT/LEFT HEART CATH AND CORONARY ANGIOGRAPHY N/A 09/18/2017   Procedure: RIGHT/LEFT HEART CATH AND CORONARY ANGIOGRAPHY;  Surgeon: Wellington Hampshire, MD;  Location: [place] [facility] LAB;  Service: Cardiovascular;  Laterality: N/A;     Current Outpatient Medications  Medication Sig Dispense Refill  . albuterol (PROVENTIL) (2.5 MG/3ML) 0.083% nebulizer solution USE ONE AMPULE(3ML) VIA NEBULIZER EVERY 4 HOURS AS NEEDED FOR WHEEZING OR SHORTNESS OF BREATH 375 mL 11  . albuterol (VENTOLIN HFA) 108 (90 Base) MCG/ACT inhaler Inhale 2 puffs into the lungs every 6 (six) hours as needed for wheezing or shortness of breath. 8 g 2  . buPROPion (WELLBUTRIN XL) 150 MG 24 hr tablet TAKE 1 TABLET BY  MOUTH DAILY 90 tablet 1  . carbidopa-levodopa (SINEMET IR) 25-250 MG tablet TAKE TWO TABLETS 3 TIMES DAILY 540 tablet 3  . carvedilol (COREG) 3.125 MG tablet TAKE TWO TABLETS TWICE A DAY WITH MEALS 120 tablet 3  . ELIQUIS 5 MG TABS tablet TAKE ONE TABLET BY MOUTH TWICE DAILY 60 tablet 5  . entacapone (COMTAN) 200 MG tablet Take 1 tablet (200 mg total) by mouth 3 (three) times daily. 270 tablet 3  . esomeprazole (NEXIUM) 40 MG capsule Take 1 capsule (40 mg total) by mouth daily. 30 capsule 2  . fluticasone (FLONASE) 50 MCG/ACT nasal spray USE 2 PUFFS IN EACH NOSTRIL DAILY 16 g 3  . furosemide (LASIX) 20 MG tablet TAKE TWO TABLETS TWICE A DAY 360 tablet 0  . HYDROcodone-acetaminophen (NORCO/VICODIN) 5-325 MG tablet Take by mouth.    . isosorbide mononitrate (IMDUR) 30 MG 24 hr tablet TAKE ONE TABLET BY MOUTH EVERY DAY 30 tablet 5  . ketoconazole (NIZORAL) 2 % cream     . ketorolac (ACULAR) 0.5 % ophthalmic solution SMARTSIG:1 Drop(s) In Eye(s) Every 4-6 Hours PRN    . levothyroxine (SYNTHROID) 50 MCG tablet TAKE ONE TABLET  ON AN EMPTY STOMACH WITHA GLASS OF WATER AT LEAST 30 TO 60 MINUTES BEFORE BREAKFAST 90 tablet 1  . losartan (COZAAR) 100 MG tablet Take 1 tablet (100 mg total) by mouth daily. 90 tablet 1  . mometasone (ELOCON) 0.1 % cream     . MYRBETRIQ 50 MG TB24 tablet TAKE ONE TABLET EVERY DAY 30 tablet 11  . OFEV 150 MG CAPS TAKE 1 CAPSULE TWICE A DAY 60 capsule 2  . potassium chloride SA (KLOR-CON) 20 MEQ tablet TAKE ONE TABLET EVERY DAY 90 tablet 0  . Respiratory Therapy Supplies (FLUTTER) DEVI 1 Device by Does not apply route daily. 1 each 0  . rosuvastatin (CRESTOR) 10 MG tablet TAKE 1 TABLET BY MOUTH DAILY 90 tablet 0  . Tiotropium Bromide-Olodaterol (STIOLTO RESPIMAT) 2.5-2.5 MCG/ACT AERS Inhale 2 puffs into the lungs daily. 1 g 6   No current facility-administered medications for this visit.    Allergies:   Pravastatin and Prednisone    Social History:  The patient  reports  that he quit smoking about 47 years ago. His smoking use included cigarettes, pipe, and cigars. He has a 10.00 pack-year smoking history. He quit smokeless tobacco use about 47 years ago.  His smokeless tobacco use included chew. He reports current alcohol use of about 4.0 standard drinks of alcohol per week. He reports that he does not use drugs.   Family History:  The patient's family history includes Alcohol abuse in his father.    ROS:  Please see the history of present illness.   Otherwise, review of systems are positive for none.   All other systems are reviewed and negative.    PHYSICAL EXAM: VS:  BP 126/76 (BP Location: Left Arm, Patient Position: Sitting, Cuff Size: Normal)   Pulse 81   Ht 5\' 7"  (1.702 m)   Wt 245 lb (111.1 kg)   SpO2 97%   BMI 38.37 kg/m  , BMI Body mass index is 38.37 kg/m. GEN: Well nourished, well developed, in no acute distress  HEENT: normal  Neck: no JVD, carotid bruits, or masses Cardiac: Irregularly irregular; no murmurs, rubs, or gallops, trace bilateral leg edema Respiratory:  clear to auscultation bilaterally with mildly diminished breath sounds, normal work of breathing GI: soft, nontender, nondistended, + BS MS: no deformity or atrophy  Skin: warm and dry, no rash Neuro:  Strength and sensation are intact Psych: euthymic mood, full affect  EKG:  EKG is ordered today. The ekg ordered today demonstrates coarse atrial fibrillation with left axis deviation   Recent Labs: 03/25/2019: NT-Pro BNP 526; TSH 3.46 07/11/2019: ALT 7 08/21/2019: BUN 16; Creatinine, Ser 0.81; Hemoglobin 14.8; Magnesium 1.8; Platelets 119.0; Potassium 3.9; Sodium 138    Lipid Panel    Component Value Date/Time   CHOL 146 08/27/2017 0419   TRIG 134 08/27/2017 0419   HDL 43 08/27/2017 0419   CHOLHDL 3.4 08/27/2017 0419   VLDL 27 08/27/2017 0419   LDLCALC 76 08/27/2017 0419      Wt Readings from Last 3 Encounters:  09/10/19 245 lb (111.1 kg)  08/21/19 246 lb  (111.6 kg)  07/31/19 252 lb (114.3 kg)       No flowsheet data found.    ASSESSMENT AND PLAN:  1.Coronary artery disease involving native coronary arteries without angina: He is doing well overall.  I recommend continuing medical therapy.    2.  Paroxysmal atrial fibrillation: He is noted to be in atrial fibrillation today he appears to be minimally  symptomatic from this and ventricular rate is controlled with carvedilol.  Previously he was on amiodarone which was discontinued due to concerns about lung toxicity.  I discussed with him different management options and given that he does not appear to be very symptomatic from this, I recommend continuing rate control and anticoagulation with Eliquis.  They are going to check his heart rate at home to make sure he is not having breakthrough tachycardia.   3. Essential hypertension: Blood pressure has been elevated.  I increased losartan to 50 mg daily.  4. Hyperlipidemia: He had myalgia with atorvastatin but he is tolerating rosuvastatin.  Most recent LDL was 76.  5.  Chronic diastolic heart failure: He appears to be euvolemic on current dose of furosemide 40 mg twice daily.    Disposition:   FU with me in 6 months  Signed,  [name], MD  09/10/2019 4:08 PM    [facility]

## 2019-09-13 ENCOUNTER — Other Ambulatory Visit: Payer: Self-pay | Admitting: Cardiovascular Disease

## 2019-09-20 ENCOUNTER — Telehealth: Payer: Self-pay | Admitting: Neurology

## 2019-09-20 NOTE — Telephone Encounter (Signed)
Original CT was done on 15 Sep 2019.    CT head 09/19/19: Edited result.  IMPRESSION: Motion degraded study  Atrophy and chronic microvascular ischemic changes. No acute abnormality.

## 2019-09-30 ENCOUNTER — Ambulatory Visit (INDEPENDENT_AMBULATORY_CARE_PROVIDER_SITE_OTHER): Payer: PPO | Admitting: Family Medicine

## 2019-09-30 ENCOUNTER — Other Ambulatory Visit: Payer: Self-pay

## 2019-09-30 ENCOUNTER — Other Ambulatory Visit: Payer: Self-pay | Admitting: Cardiovascular Disease

## 2019-09-30 ENCOUNTER — Encounter: Payer: Self-pay | Admitting: Family Medicine

## 2019-09-30 DIAGNOSIS — R0781 Pleurodynia: Secondary | ICD-10-CM

## 2019-09-30 DIAGNOSIS — F329 Major depressive disorder, single episode, unspecified: Secondary | ICD-10-CM

## 2019-09-30 DIAGNOSIS — M25512 Pain in left shoulder: Secondary | ICD-10-CM

## 2019-09-30 DIAGNOSIS — I10 Essential (primary) hypertension: Secondary | ICD-10-CM

## 2019-09-30 DIAGNOSIS — J8410 Pulmonary fibrosis, unspecified: Secondary | ICD-10-CM

## 2019-09-30 DIAGNOSIS — F419 Anxiety disorder, unspecified: Secondary | ICD-10-CM

## 2019-09-30 DIAGNOSIS — F32A Depression, unspecified: Secondary | ICD-10-CM

## 2019-09-30 MED ORDER — BUPROPION HCL ER (XL) 300 MG PO TB24: 300.0000 mg | ORAL_TABLET | Freq: Every day | ORAL | 1 refills | Status: DC

## 2019-09-30 NOTE — Assessment & Plan Note (Signed)
He will continue to see pulmonology.

## 2019-09-30 NOTE — Assessment & Plan Note (Signed)
At goal. Continue current regimen. 

## 2019-09-30 NOTE — Assessment & Plan Note (Signed)
Improved.  Suspect muscle strain.  He will monitor for worsening or recurrence.

## 2019-09-30 NOTE — Assessment & Plan Note (Signed)
Improved.  Suspect muscle strain.  He will monitor.

## 2019-09-30 NOTE — Patient Instructions (Signed)
Nice to see you. We will increase your Wellbutrin. Please monitor your shoulder and ribs. You do not need to start on the balance of nature supplement.

## 2019-09-30 NOTE — Progress Notes (Signed)
[name], MD Phone: [phone]  Andre [name] is a 84 y.o. male who presents today for follow-up.  Depression: Continues to have issues with this.  Currently on Wellbutrin which has been beneficial.  Sometimes he is okay though does not usually have the desire to do much of anything.  He has been working on his boat and that has been helpful.  No SI.  Hypertension: Typically similar to today.  Taking losartan, carvedilol, and Lasix.  No chest pain.  He has chronic dyspnea on exertion that is stable.  Interstitial lung disease: Currently on Ofev.  They are unsure if this has made a difference.  It has decreased his appetite.  He is following with pulmonology.  Left shoulder pain/right lateral rib pain: Patient's been working on his boat and noted that he started to hurt about a week ago while he was working on the boat.  There is no pain with movement of the shoulder.  Currently no rib pain.  He notes the discomfort has been progressively improving.  Social History   Tobacco Use  Smoking Status Former Smoker  . Packs/day: 1.00  . Years: 10.00  . Pack years: 10.00  . Types: Cigarettes, Pipe, Cigars  . Quit date: 04/18/1972  . Years since quitting: 47.4  Smokeless Tobacco Former Systems developer  . Types: Chew  . Quit date: 04/18/1972     ROS see history of present illness  Objective  Physical Exam Vitals:   09/30/19 1339  BP: 120/80  Pulse: (!) 55  Temp: (!) 95.6 F (35.3 C)  SpO2: 96%    BP Readings from Last 3 Encounters:  09/30/19 120/80  09/10/19 126/76  08/21/19 124/70   Wt Readings from Last 3 Encounters:  09/30/19 239 lb 6.4 oz (108.6 kg)  09/10/19 245 lb (111.1 kg)  08/21/19 246 lb (111.6 kg)    Physical Exam Constitutional:      General: He is not in acute distress.    Appearance: He is not diaphoretic.  Cardiovascular:     Rate and Rhythm: Normal rate. Rhythm irregularly irregular.     Heart sounds: Normal heart sounds.  Pulmonary:     Effort:  Pulmonary effort is normal.     Breath sounds: Normal breath sounds.  Musculoskeletal:     Right lower leg: No edema.     Left lower leg: No edema.     Comments: Left shoulder with no tenderness, full range of motion with no discomfort, right ribs with no tenderness laterally, no bony defects noted, single bruise about the size of a dime  Skin:    General: Skin is warm and dry.  Neurological:     Mental Status: He is alert.      Assessment/Plan: Please see individual problem list.  Essential hypertension At goal.  Continue current regimen.  Pulmonary fibrosis ([place]) He will continue to see pulmonology.  Anxiety and depression Continues to have depression.  We will increase his Wellbutrin.  Follow-up in 2 months.  Rib pain on right side Improved.  Suspect muscle strain.  He will monitor.  Left shoulder pain Improved.  Suspect muscle strain.  He will monitor for worsening or recurrence.   No orders of the defined types were placed in this encounter.   Meds ordered this encounter  Medications  . buPROPion (WELLBUTRIN XL) 300 MG 24 hr tablet    Sig: Take 1 tablet (300 mg total) by mouth daily.    Dispense:  90 tablet    Refill:  1    This visit occurred during the SARS-CoV-2 public health emergency.  Safety protocols were in place, including screening questions prior to the visit, additional usage of staff PPE, and extensive cleaning of exam room while observing appropriate contact time as indicated for disinfecting solutions.    [name], MD [place]

## 2019-09-30 NOTE — Assessment & Plan Note (Signed)
Continues to have depression.  We will increase his Wellbutrin.  Follow-up in 2 months.

## 2019-10-05 ENCOUNTER — Other Ambulatory Visit: Payer: Self-pay | Admitting: Primary Care

## 2019-10-06 ENCOUNTER — Telehealth: Payer: Self-pay | Admitting: Pulmonary Disease

## 2019-10-06 NOTE — Telephone Encounter (Signed)
Returned patient's call to [facility] on-call answering service. His wife answered and relayed that the patient has ongoing loss of appetite that both he and she think is secondary to his Ofev medication. Reviewing past clinic notes, it appears that this has been raised previously.  I explained that the most appropriate action at this time is for me to notify the patient's primary Pulmonologist and prescriber of that medication such that they can arrange for a clinic appointment or phone call to discuss this further. I will CC Dr. Vaughan Browner on this encounter so that he is aware and can arrange for this. All questions were answered to patient's satisfaction.

## 2019-10-07 ENCOUNTER — Telehealth: Payer: Self-pay | Admitting: Pulmonary Disease

## 2019-10-07 ENCOUNTER — Ambulatory Visit: Payer: PPO | Admitting: Family Medicine

## 2019-10-07 NOTE — Telephone Encounter (Signed)
Patient wife states that Andre [name] has lost 30 lbs since starting Ofev in February. Please advise.  Pt on Ofev which causes loss of appetite. Pt's wife calling stating he has lost right much weight. Pt weak. Requested a call back from Dr. Vaughan Browner. Please advise.

## 2019-10-08 ENCOUNTER — Telehealth: Payer: Self-pay | Admitting: Pulmonary Disease

## 2019-10-08 DIAGNOSIS — J441 Chronic obstructive pulmonary disease with (acute) exacerbation: Secondary | ICD-10-CM

## 2019-10-08 MED ORDER — STIOLTO RESPIMAT 2.5-2.5 MCG/ACT IN AERS: 2.0000 | INHALATION_SPRAY | Freq: Every day | RESPIRATORY_TRACT | 5 refills | Status: DC

## 2019-10-08 NOTE — Telephone Encounter (Signed)
Andre [name] states patient has lost weight since being on Ofev. Andre [name] phone number is [phone].

## 2019-10-08 NOTE — Telephone Encounter (Signed)
LMTCB x1 for pt's wife, Fraser Din.

## 2019-10-08 NOTE — Telephone Encounter (Signed)
Andre [name] returning a phone call. Andre [name] can be reached at [phone]

## 2019-10-08 NOTE — Telephone Encounter (Signed)
Patient wife contacted, refill of Stiolto sent to preferred pharmacy.

## 2019-10-08 NOTE — Telephone Encounter (Signed)
I have left a detailed message on Andre [name]'s named VM letting her know that we are still waiting on Dr. Matilde Bash response.

## 2019-10-09 ENCOUNTER — Other Ambulatory Visit: Payer: Self-pay | Admitting: Cardiovascular Disease

## 2019-10-09 NOTE — Telephone Encounter (Signed)
Please make televisit or clinic follow up to discuss this issue

## 2019-10-09 NOTE — Telephone Encounter (Signed)
Called pt and advised message from the provider. Pt understood and verbalized understanding. Nothing further is needed.   Appt made to see Beth on 10/16/2019 at 9:30

## 2019-10-15 NOTE — Telephone Encounter (Signed)
Dr. Vaughan Browner please advise. Did you already address this?

## 2019-10-16 ENCOUNTER — Ambulatory Visit: Payer: PPO | Admitting: Primary Care

## 2019-10-16 ENCOUNTER — Encounter: Payer: Self-pay | Admitting: Primary Care

## 2019-10-16 ENCOUNTER — Telehealth: Payer: Self-pay | Admitting: Primary Care

## 2019-10-16 ENCOUNTER — Other Ambulatory Visit: Payer: Self-pay

## 2019-10-16 VITALS — BP 114/68 | HR 84 | Temp 98.0°F | Ht 67.0 in | Wt 233.4 lb

## 2019-10-16 DIAGNOSIS — J849 Interstitial pulmonary disease, unspecified: Secondary | ICD-10-CM

## 2019-10-16 DIAGNOSIS — R630 Anorexia: Secondary | ICD-10-CM | POA: Diagnosis not present

## 2019-10-16 LAB — COMPREHENSIVE METABOLIC PANEL
ALT: 7 U/L (ref 0–53)
AST: 14 U/L (ref 0–37)
Albumin: 4 g/dL (ref 3.5–5.2)
Alkaline Phosphatase: 66 U/L (ref 39–117)
BUN: 16 mg/dL (ref 6–23)
CO2: 29 mEq/L (ref 19–32)
Calcium: 9.4 mg/dL (ref 8.4–10.5)
Chloride: 99 mEq/L (ref 96–112)
Creatinine, Ser: 1.03 mg/dL (ref 0.40–1.50)
GFR: 69.15 mL/min (ref >60.00)
Glucose, Bld: 120 mg/dL — ABNORMAL HIGH (ref 70–99)
Potassium: 4 mEq/L (ref 3.5–5.1)
Sodium: 136 mEq/L (ref 135–145)
Total Bilirubin: 1 mg/dL (ref 0.2–1.2)
Total Protein: 6.5 g/dL (ref 6.0–8.3)

## 2019-10-16 LAB — ALBUMIN: Albumin: 4 g/dL (ref 3.5–5.2)

## 2019-10-16 NOTE — Telephone Encounter (Signed)
Called patient for his televisit.  No answer, left message x 2 He will need to call back and reschedule if after 9:45/9:50am today

## 2019-10-16 NOTE — Patient Instructions (Addendum)
Recommendations: Hold OFEV for 1 week (unbtil we speak over the phone) Take stool softener every other day; stay well hydrated  Orders: Labs today  Follow-up Televisit in 1 week with [facility]

## 2019-10-16 NOTE — Telephone Encounter (Signed)
Please review previous notes.  He has a follow-up with Derl Barrow, NP today to address this.

## 2019-10-16 NOTE — Telephone Encounter (Signed)
Patient arrived for in person visit.

## 2019-10-16 NOTE — Telephone Encounter (Signed)
Attempted to call patient to try and work him in for his Televisit. We were unable to reach him for his 0930 appointment. Beth can try to work him in later today if he calls back or reschedule his appointment. Waiting for call.

## 2019-10-16 NOTE — Progress Notes (Signed)
Virtual Visit via Telephone Note  I connected with Andre [name] on 10/16/19 at  9:30 AM EDT by telephone and verified that I am speaking with the correct person using two identifiers.  Location: Patient: Home Provider: Office   I discussed the limitations, risks, security and privacy concerns of performing an evaluation and management service by telephone and the availability of in person appointments. I also discussed with the patient that there may be a patient responsible charge related to this service. The patient expressed understanding and agreed to proceed.   History of Present Illness: 84 year old with history of pulmonary fibrosis, recurrent respiratory failure, eosinophilic pleural effusion, chronic diastolic heart failure, sleep apnea, Parkinson's,  Previously followed [facility].  He was also evaluated at [facility] for pulmonary fibrosis thought to be secondary to prior asbestos exposure.  Recommendation was made to follow-up with a CT in January 2020.  However he cannot keep the appointment due to high co-pay and wishes to transfer care to [facility].  He has had at least 3 admissions over the past year for recurrent respiratory failure, HCAP, right pleural effusion status post thoracentesis on 09/19/2017 with cell count showing exudate with elevated eosinophils.   He used to remain active until age 20 when he had a CABG.  Has run 52 marathons in the past.  After his CABG he took up long distance cycling which he had to give up about 10 years ago.  Initially on Anoro.  This was changed to [facility] in May 2020 as he did not achieve adequate inspiratory flow. Treated with antibiotics in December 2020 with chest x-ray showing right lung pneumonia for which he was treated with antibiotics Follow-up CT scan shows resolution of pneumonia but shows progression of pulmonary fibrosis.   Started on Ofev 05/26/18.    Pets: No pets Occupation: Worked as a Actor ILD  questionnaire 05/15/2018: Exposure to asbestos, no other significant exposure. Smoking history:20-pack-year smoker.  Quit smoking in 1985 Travel history: No significant recent travel Relevant family history: No family history of lung disease.  Previous [facility] encounters:  07/25/19- Dr. Vaughan Browner visit  Tolerating ofev with no GI symptoms Overall he feels improved compared to before increased exercise capacity.  Still has some cough with congestion  Pulmonary fibrosis, asbestosis CT scan reviewed with basilar fibrosis, pleural calcifications consistent with asbestos exposure Due to progression of pulmonary fibrosis he has been initiated on Ofev Recent LFTs are okay  Dyspnea is multifactorial from ILD, obesity, diastolic heart failure, coronary artery disease Suspect anxiety is a significant component to his episodes of dyspnea Will benefit from pulmonary rehab but he will like to hold off until the Covid pandemic has improved  Emphysema COPD PFTs reviewed with moderate obstruction.   Was initially on Anoro.  This was changed to Stiolto Stable after recent exacerbation Continue flutter valve for mucociliary clearance   10/16/2019- interim hx Patient contacted today for virtual televisit.  Called no answer, left message x 2. Patient present to office for today's visit.  He reports loss of appetite and weight loss since starting anti-fibrotic medication  He is eating but very little  He has lost 40 lbs in the last year He is able to bend over and breathing is slightly better with weight loss He is on currently on 150mg  OFEV twice daily He has constipation and a very little amount of diarrhea  He is taking over the counter stool softener once a day; then he will take imodium for diarrhea the  next day   Observations/Objective:  - Appears well, over weight - Lungs with fine crackles to bases  Assessment and Plan:  ILD: - Reports decreased appetite and weight loss since being on  anti-fibrotic medication - Plan stop OFEV for 1 week and then resume at 100mg  twice daily - Checking CMET today - Due for repeat HRCT to monitor progression of fibrosis  Follow Up Instructions:  - 1 week televisit to assess how he is doing before re-starting OFEV at lower dose    I discussed the assessment and treatment plan with the patient. The patient was provided an opportunity to ask questions and all were answered. The patient agreed with the plan and demonstrated an understanding of the instructions.   The patient was advised to call back or seek an in-person evaluation if the symptoms worsen or if the condition fails to improve as anticipated.  I provided 22 minutes of non-face-to-face time during this encounter.   Martyn Ehrich, NP

## 2019-10-17 ENCOUNTER — Other Ambulatory Visit: Payer: Self-pay | Admitting: Family Medicine

## 2019-10-23 ENCOUNTER — Other Ambulatory Visit: Payer: Self-pay

## 2019-10-23 ENCOUNTER — Ambulatory Visit
Admission: RE | Admit: 2019-10-23 | Discharge: 2019-10-23 | Disposition: A | Discharge status: Home / Self Care | Payer: PPO | Source: Ambulatory Visit | Attending: Primary Care | Admitting: Primary Care

## 2019-10-23 DIAGNOSIS — J90 Pleural effusion, not elsewhere classified: Secondary | ICD-10-CM | POA: Diagnosis not present

## 2019-10-23 DIAGNOSIS — J439 Emphysema, unspecified: Secondary | ICD-10-CM | POA: Diagnosis not present

## 2019-10-23 DIAGNOSIS — J929 Pleural plaque without asbestos: Secondary | ICD-10-CM | POA: Diagnosis not present

## 2019-10-23 DIAGNOSIS — J849 Interstitial pulmonary disease, unspecified: Secondary | ICD-10-CM

## 2019-10-23 DIAGNOSIS — J8410 Pulmonary fibrosis, unspecified: Secondary | ICD-10-CM | POA: Diagnosis not present

## 2019-10-25 ENCOUNTER — Telehealth: Payer: Self-pay | Admitting: Primary Care

## 2019-10-25 NOTE — Telephone Encounter (Signed)
Patient and wife, Andre [name], called with normal blood work results.

## 2019-10-28 ENCOUNTER — Ambulatory Visit: Payer: PPO | Admitting: Dermatology

## 2019-10-28 ENCOUNTER — Other Ambulatory Visit: Payer: Self-pay

## 2019-10-28 DIAGNOSIS — L820 Inflamed seborrheic keratosis: Secondary | ICD-10-CM

## 2019-10-28 DIAGNOSIS — L570 Actinic keratosis: Secondary | ICD-10-CM | POA: Diagnosis not present

## 2019-10-28 DIAGNOSIS — L578 Other skin changes due to chronic exposure to nonionizing radiation: Secondary | ICD-10-CM | POA: Diagnosis not present

## 2019-10-28 NOTE — Progress Notes (Signed)
   Follow-Up Visit   Subjective  Andre [name] is a 84 y.o. male who presents for the following: Follow-up (AK follow up face, scalp, ears, arms and hands treated with LN2 06/2019.).  The following portions of the chart were reviewed this encounter and updated as appropriate:  Tobacco  Allergies  Meds  Problems  Med Hx  Surg Hx  Fam Hx     Review of Systems:  No other skin or systemic complaints except as noted in HPI or Assessment and Plan.  Objective  Well appearing patient in no apparent distress; mood and affect are within normal limits.  A focused examination was performed including face, scalp, ears, neck, arms/hands. Relevant physical exam findings are noted in the Assessment and Plan.  Objective  Scalp, face (7): Erythematous thin papules/macules with gritty scale.   Objective  Scalp, face: Diffuse scaly erythematous macules with underlying dyspigmentation.   Objective  arms (7): Erythematous stuck-on, waxy papule or plaque.    Assessment & Plan    AK (actinic keratosis) (7) Scalp, face  Start Fluorouracil 5%/Calcipotriene 0.05% cream bid x 7 days. 30g 1RF to [facility].  Recheck on follow up.  Destruction of lesion - Scalp, face Complexity: simple   Destruction method: cryotherapy   Informed consent: discussed and consent obtained   Timeout:  patient name, date of birth, surgical site, and procedure verified Lesion destroyed using liquid nitrogen: Yes   Region frozen until ice ball extended beyond lesion: Yes   Outcome: patient tolerated procedure well with no complications   Post-procedure details: wound care instructions given    Actinic skin damage Scalp, face  Inflamed seborrheic keratosis (7) arms  Destruction of lesion - arms Complexity: simple   Destruction method: cryotherapy   Informed consent: discussed and consent obtained   Timeout:  patient name, date of birth, surgical site, and procedure verified Lesion destroyed using  liquid nitrogen: Yes   Region frozen until ice ball extended beyond lesion: Yes   Outcome: patient tolerated procedure well with no complications   Post-procedure details: wound care instructions given    Return in about 2 months (around 12/29/2019).   I, Ashok Cordia, CMA, am acting as scribe for Sarina Ser, MD .  Documentation: I have reviewed the above documentation for accuracy and completeness, and I agree with the above.  Sarina Ser, MD

## 2019-10-28 NOTE — Patient Instructions (Signed)
Instructions for [facility] Medications  One or more of your medications was sent to the [facility] mail order compounding pharmacy. You will receive an email from them and can purchase the medicine through that link. It will then be mailed to your home at the address you confirmed. If for any reason you do not receive an email from them, please check your spam folder. If you still do not find the email, please let us know.    Wound Care Instructions  1. Cleanse wound gently with soap and water once a day then pat dry with clean gauze. Apply a thing coat of Petrolatum (petroleum jelly, "Vaseline") over the wound (unless you have an allergy to this). We recommend that you use a new, sterile tube of Vaseline. Do not pick or remove scabs. Do not remove the yellow or white "healing tissue" from the base of the wound.  2. Cover the wound with fresh, clean, nonstick gauze and secure with paper tape. You may use Band-Aids in place of gauze and tape if the would is small enough, but would recommend trimming much of the tape off as there is often too much. Sometimes Band-Aids can irritate the skin.  3. You should call the office for your biopsy report after 1 week if you have not already been contacted.  4. If you experience any problems, such as abnormal amounts of bleeding, swelling, significant bruising, significant pain, or evidence of infection, please call the office immediately.  5. FOR ADULT SURGERY PATIENTS: If you need something for pain relief you may take 1 extra strength Tylenol (acetaminophen) AND 2 Ibuprofen (200mg  each) together every 4 hours as needed for pain. (do not take these if you are allergic to them or if you have a reason you should not take them.) Typically, you may only need pain medication for 1 to 3 days.

## 2019-10-29 ENCOUNTER — Encounter: Payer: Self-pay | Admitting: Dermatology

## 2019-10-30 ENCOUNTER — Other Ambulatory Visit: Payer: Self-pay

## 2019-10-30 ENCOUNTER — Encounter: Payer: Self-pay | Admitting: Primary Care

## 2019-10-30 ENCOUNTER — Telehealth: Payer: Self-pay | Admitting: Primary Care

## 2019-10-30 ENCOUNTER — Encounter: Payer: Self-pay | Admitting: Internal Medicine

## 2019-10-30 ENCOUNTER — Ambulatory Visit
Admission: RE | Admit: 2019-10-30 | Discharge: 2019-10-30 | Disposition: A | Discharge status: Home / Self Care | Payer: PPO | Source: Ambulatory Visit | Attending: Internal Medicine | Admitting: Internal Medicine

## 2019-10-30 ENCOUNTER — Ambulatory Visit (INDEPENDENT_AMBULATORY_CARE_PROVIDER_SITE_OTHER): Payer: PPO | Admitting: Primary Care

## 2019-10-30 ENCOUNTER — Ambulatory Visit (INDEPENDENT_AMBULATORY_CARE_PROVIDER_SITE_OTHER): Payer: PPO | Admitting: Internal Medicine

## 2019-10-30 VITALS — BP 106/78 | HR 78 | Ht 67.0 in | Wt 231.8 lb

## 2019-10-30 DIAGNOSIS — Z5181 Encounter for therapeutic drug level monitoring: Secondary | ICD-10-CM

## 2019-10-30 DIAGNOSIS — R519 Headache, unspecified: Secondary | ICD-10-CM

## 2019-10-30 DIAGNOSIS — R269 Unspecified abnormalities of gait and mobility: Secondary | ICD-10-CM | POA: Diagnosis not present

## 2019-10-30 DIAGNOSIS — J849 Interstitial pulmonary disease, unspecified: Secondary | ICD-10-CM

## 2019-10-30 DIAGNOSIS — Z79899 Other long term (current) drug therapy: Secondary | ICD-10-CM

## 2019-10-30 DIAGNOSIS — T148XXA Other injury of unspecified body region, initial encounter: Secondary | ICD-10-CM

## 2019-10-30 DIAGNOSIS — J8410 Pulmonary fibrosis, unspecified: Secondary | ICD-10-CM

## 2019-10-30 MED ORDER — OFEV 150 MG PO CAPS: 1.0000 | ORAL_CAPSULE | Freq: Two times a day (BID) | ORAL | 2 refills | Status: DC

## 2019-10-30 MED ORDER — DOXYCYCLINE HYCLATE 100 MG PO TABS: 100.0000 mg | ORAL_TABLET | Freq: Two times a day (BID) | ORAL | 0 refills | Status: DC

## 2019-10-30 MED ORDER — MUPIROCIN 2 % EX OINT: 1.0000 "application " | TOPICAL_OINTMENT | Freq: Three times a day (TID) | CUTANEOUS | 2 refills | Status: DC

## 2019-10-30 NOTE — Progress Notes (Signed)
EdematousVirtual Visit via Telephone Note  I connected with Andre [name] on 10/30/19 at 11:30 AM EDT by telephone and verified that I am speaking with the correct person using two identifiers.  Location: Patient: Home Provider: Office   I discussed the limitations, risks, security and privacy concerns of performing an evaluation and management service by telephone and the availability of in person appointments. I also discussed with the patient that there may be a patient responsible charge related to this service. The patient expressed understanding and agreed to proceed.   History of Present Illness: 84 year old with history of pulmonary fibrosis, recurrent respiratory failure, eosinophilic pleural effusion, chronic diastolic heart failure, sleep apnea, Parkinson's. Patient of Dr. Vaughan Browner, last seen 07/25/19. Currently on OFEV 150mg  twice daily. Took one week holiday in June 2021 d/t nausea and weight loss. This improved and patient wants to stay on 150mg  dose. LFTs have been wnl.    Previous [facility] pulmonary encounters:  07/25/19- Dr. Vaughan Browner visit  Tolerating ofev with no GI symptoms Overall he feels improved compared to before increased exercise capacity.  Still has some cough with congestion  CT scan reviewed with basilar fibrosis, pleural calcifications consistent with asbestos exposure Due to progression of pulmonary fibrosis he has been initiated on Ofev Recent LFTs are okay Dyspnea is multifactorial from ILD, obesity, diastolic heart failure, coronary artery disease Suspect anxiety is a significant component to his episodes of dyspnea Will benefit from pulmonary rehab but he will like to hold off until the Covid pandemic has improved  PFTs reviewed with moderate obstruction.   Was initially on Anoro.  This was changed to [place] after recent exacerbation Continue flutter valve for mucociliary clearance   6/30/2021[name], NP Patient contacted today for virtual televisit.   Called no answer, left message x 2. Patient present to office for today's visit.  He reports loss of appetite and weight loss since starting anti-fibrotic medication  He is eating but very little  He has lost 40 lbs in the last year He is able to bend over and breathing is slightly better with weight loss He is on currently on 150mg  OFEV twice daily He has constipation and a very little amount of diarrhea  He is taking over the counter stool softener once a day; then he will take imodium for diarrhea the next day  10/30/2019- Interim HX Patient contacted today for 2-week follow-up visit.  He had a repeat HRCT on July seventh that redemonstrated mild pulmonary fibrosis, small bilateral pleural effusions, calcified bilateral pleural plaques in keeping with asbestos pleural disease, mild lobular air trapping suggestive of small airway disease, minimal paraseptal emphysema and coronary artery disease.  Fibrotic findings are very slightly worsened over time dating back to 2013.  They remain consistent with indeterminate for UIP.  During last visit we held [facility] for 1 week and recommended resuming at lower dose 100mg  twice daily. Feels appetite is back to baseline. He is not worried about his appetite or weight loss. Wife states that he is doing better. Liver function was normal. Specialty pharmacy is [facility].     Observations/Objective:  - Appears well; no overt shortness of breath or wheezing - Wife on phone as well speaking with him  - Weight 229.8 (no clothes)  Assessment and Plan:  ILD: - Minimal progression on recent HRCT in July since 2013. Patient reported nausea and weight loss 2 weeks ago. His appetite has since improved with 1 week holiday off OFEV, patient wants to stay on  150mg  dose. Weight 229.8. Liver function testing has been normal. Plan Continue OFEV 150mg  twice daily (re-new prescription with [facility]). FU in 4 weeks with LFTs  COPD/Emphysema: - Stable, no recent exacerbations   - Continue Stiolto Respimat 2 puffs once daily  Follow Up Instructions:   - 4-6 weeks with Dr. Vaughan Browner in office   I discussed the assessment and treatment plan with the patient. The patient was provided an opportunity to ask questions and all were answered. The patient agreed with the plan and demonstrated an understanding of the instructions.   The patient was advised to call back or seek an in-person evaluation if the symptoms worsen or if the condition fails to improve as anticipated.  I provided 18 minutes of non-face-to-face time during this encounter.   Martyn Ehrich, NP

## 2019-10-30 NOTE — Patient Instructions (Signed)
Bactin max spray antiseptic and numbing spray  Tylenol for pain   Use chlorhexidine soap or Hibiclens soap daily to 2-3 x per day    Fall Prevention in the Home, Adult Falls can cause injuries and can affect people from all age groups. There are many simple things that you can do to make your home safe and to help prevent falls. Ask for help when making these changes, if needed. What actions can I take to prevent falls? General instructions  Use good lighting in all rooms. Replace any light bulbs that burn out.  Turn on lights if it is dark. Use night-lights.  Place frequently used items in easy-to-reach places. Lower the shelves around your home if necessary.  Set up furniture so that there are clear paths around it. Avoid moving your furniture around.  Remove throw rugs and other tripping hazards from the floor.  Avoid walking on wet floors.  Fix any uneven floor surfaces.  Add color or contrast paint or tape to grab bars and handrails in your home. Place contrasting color strips on the first and last steps of stairways.  When you use a stepladder, make sure that it is completely opened and that the sides are firmly locked. Have someone hold the ladder while you are using it. Do not climb a closed stepladder.  Be aware of any and all pets. What can I do in the bathroom?      Keep the floor dry. Immediately clean up any water that spills onto the floor.  Remove soap buildup in the tub or shower on a regular basis.  Use non-skid mats or decals on the floor of the tub or shower.  Attach bath mats securely with double-sided, non-slip rug tape.  If you need to sit down while you are in the shower, use a plastic, non-slip stool.  Install grab bars by the toilet and in the tub and shower. Do not use towel bars as grab bars. What can I do in the bedroom?  Make sure that a bedside light is easy to reach.  Do not use oversized bedding that drapes onto the floor.  Have a  firm chair that has side arms to use for getting dressed. What can I do in the kitchen?  Clean up any spills right away.  If you need to reach for something above you, use a sturdy step stool that has a grab bar.  Keep electrical cables out of the way.  Do not use floor polish or wax that makes floors slippery. If you must use wax, make sure that it is non-skid floor wax. What can I do in the stairways?  Do not leave any items on the stairs.  Make sure that you have a light switch at the top of the stairs and the bottom of the stairs. Have them installed if you do not have them.  Make sure that there are handrails on both sides of the stairs. Fix handrails that are broken or loose. Make sure that handrails are as long as the stairways.  Install non-slip stair treads on all stairs in your home.  Avoid having throw rugs at the top or bottom of stairways, or secure the rugs with carpet tape to prevent them from moving.  Choose a carpet design that does not hide the edge of steps on the stairway.  Check any carpeting to make sure that it is firmly attached to the stairs. Fix any carpet that is loose or worn. What  can I do on the outside of my home?  Use bright outdoor lighting.  Regularly repair the edges of walkways and driveways and fix any cracks.  Remove high doorway thresholds.  Trim any shrubbery on the main path into your home.  Regularly check that handrails are securely fastened and in good repair. Both sides of any steps should have handrails.  Install guardrails along the edges of any raised decks or porches.  Clear walkways of debris and clutter, including tools and rocks.  Have leaves, snow, and ice cleared regularly.  Use sand or salt on walkways during winter months.  In the garage, clean up any spills right away, including grease or oil spills. What other actions can I take?  Wear closed-toe shoes that fit well and support your feet. Wear shoes that have  rubber soles or low heels.  Use mobility aids as needed, such as canes, walkers, scooters, and crutches.  Review your medicines with your health care provider. Some medicines can cause dizziness or changes in blood pressure, which increase your risk of falling. Talk with your health care provider about other ways that you can decrease your risk of falls. This may include working with a physical therapist or trainer to improve your strength, balance, and endurance. Where to find more information  Centers for Disease Control and Prevention, [employer]AD[employer]: WebmailGuide.co.za  [employer] on Aging: BrainJudge.co.uk Contact a health care provider if:  You are afraid of falling at home.  You feel weak, drowsy, or dizzy at home.  You fall at home. Summary  There are many simple things that you can do to make your home safe and to help prevent falls.  Ways to make your home safe include removing tripping hazards and installing grab bars in the bathroom.  Ask for help when making these changes in your home. This information is not intended to replace advice given to you by your health care provider. Make sure you discuss any questions you have with your health care provider. Document Revised: 03/17/2017 Document Reviewed: 11/17/2016 Elsevier Patient Education  [place], Adult Taking care of your wound properly can help to prevent pain, infection, and scarring. It can also help your wound to heal more quickly. How to care for your wound Wound care      Follow instructions from your health care provider about how to take care of your wound. Make sure you: ? Wash your hands with soap and water before you change the bandage (dressing). If soap and water are not available, use hand sanitizer. ? Change your dressing as told by your health care provider. ? Leave stitches (sutures), skin glue, or adhesive strips in place. These skin closures may need to stay  in place for 2 weeks or longer. If adhesive strip edges start to loosen and curl up, you may trim the loose edges. Do not remove adhesive strips completely unless your health care provider tells you to do that.  Check your wound area every day for signs of infection. Check for: ? Redness, swelling, or pain. ? Fluid or blood. ? Warmth. ? Pus or a bad smell.  Ask your health care provider if you should clean the wound with mild soap and water. Doing this may include: ? Using a clean towel to pat the wound dry after cleaning it. Do not rub or scrub the wound. ? Applying a cream or ointment. Do this only as told by your health care provider. ? Covering the  incision with a clean dressing.  Ask your health care provider when you can leave the wound uncovered.  Keep the dressing dry until your health care provider says it can be removed. Do not take baths, swim, use a hot tub, or do anything that would put the wound underwater until your health care provider approves. Ask your health care provider if you can take showers. You may only be allowed to take sponge baths. Medicines   If you were prescribed an antibiotic medicine, cream, or ointment, take or use the antibiotic as told by your health care provider. Do not stop taking or using the antibiotic even if your condition improves.  Take over-the-counter and prescription medicines only as told by your health care provider. If you were prescribed pain medicine, take it 30 or more minutes before you do any wound care or as told by your health care provider. General instructions  Return to your normal activities as told by your health care provider. Ask your health care provider what activities are safe.  Do not scratch or pick at the wound.  Do not use any products that contain nicotine or tobacco, such as cigarettes and e-cigarettes. These may delay wound healing. If you need help quitting, ask your health care provider.  Keep all follow-up  visits as told by your health care provider. This is important.  Eat a diet that includes protein, vitamin A, vitamin C, and other nutrient-rich foods to help the wound heal. ? Foods rich in protein include meat, dairy, beans, nuts, and other sources. ? Foods rich in vitamin A include carrots and dark green, leafy vegetables. ? Foods rich in vitamin C include citrus, tomatoes, and other fruits and vegetables. ? Nutrient-rich foods have protein, carbohydrates, fat, vitamins, or minerals. Eat a variety of healthy foods including vegetables, fruits, and whole grains. Contact a health care provider if:  You received a tetanus shot and you have swelling, severe pain, redness, or bleeding at the injection site.  Your pain is not controlled with medicine.  You have redness, swelling, or pain around the wound.  You have fluid or blood coming from the wound.  Your wound feels warm to the touch.  You have pus or a bad smell coming from the wound.  You have a fever or chills.  You are nauseous or you vomit.  You are dizzy. Get help right away if:  You have a red streak going away from your wound.  The edges of the wound open up and separate.  Your wound is bleeding, and the bleeding does not stop with gentle pressure.  You have a rash.  You faint.  You have trouble breathing. Summary  Always wash your hands with soap and water before changing your bandage (dressing).  To help with healing, eat foods that are rich in protein, vitamin A, vitamin C, and other nutrients.  Check your wound every day for signs of infection. Contact your health care provider if you suspect that your wound is infected. This information is not intended to replace advice given to you by your health care provider. Make sure you discuss any questions you have with your health care provider. Document Revised: 07/23/2018 Document Reviewed: 10/20/2015 Elsevier Patient Education  [place].

## 2019-10-30 NOTE — Patient Instructions (Addendum)
Continue OFEV 150mg  twice daily Liver function testing was normal CT chest showed minimal progression of fibrosis since 2013 FU LFTs in 4 weeks AND office visit with Dr. Vaughan Browner

## 2019-10-30 NOTE — Assessment & Plan Note (Signed)
-   HRCT in July 2021 redemonstrated mild pulmonary fibrosis, calcified bilateral pleural plaques in keeping with asbestos pleural disease  - OFEV 150mg  twice daily- patient took 1 week holiday in June d/t nausea and weight loss. Resume at 150mg , patient request and continues to tolerate. LFTs have been normal. Repeat in 4 weeks.   - Specialty pharmacy is [facility]

## 2019-10-30 NOTE — Progress Notes (Signed)
Chief Complaint  Patient presents with  . Fall  . Head Injury  . Arm Injury   With wife today  Fall on concrete 7/12 with multiple abrasions left hand and right forehead and left abdomen and left forearm with forearm wound bleeding excessively since 7/12 but w/o pain denies h/a denies left arm pain Td had 02/08/17 he is on eliquis    Review of Systems  Musculoskeletal: Positive for falls. Negative for joint pain.  Skin:       +multiple abrasions   Neurological: Negative for dizziness and headaches.   Past Medical History:  Diagnosis Date  . Atherosclerosis of abdominal aorta ([place])   . Basal cell carcinoma 03/04/2008   Right nose supratip.   [name] CAD (coronary artery disease)   . Cervical spondylosis 10/01/2013  . Chronic diastolic CHF (congestive heart failure) ([place])    a. 07/2016 Echo: >55%; b. 10/2016 Echo: EF 55-60%, Gr1 DD, Ao sclerosis w/o stenosis, sev dil LA; c. 08/2017 Echo: EF 60-65%, no rwma, Gr2 DD, mild AS, sev dil LA/RA.  [name] Coronary artery disease    a. 1998 s/p mini-cabg @ [facility] - LIMA->LAD;  b. 07/2016 St Echo: Inadequate HR w/ HTN response;  c.  08/2016 MV: EF 67%, no ischemia; d. 10/2016 NSTEMI/Cath: RCA 95p (4.0x26 Onyx DES), LIMA->LAD nl; e. 09/2017 Cath: LM 40/30, LAD 100ost, RI 80, LCX nl, OM2/3 nl, RCA patent stent, 47m, LIMA->LAD nl-->Med Rx.  . DDD (degenerative disc disease), cervical   . DDD (degenerative disc disease), lumbar   . Depression   . Gait abnormality 07/31/2019  . GERD (gastroesophageal reflux disease)   . Hyperlipidemia   . Hypertension   . Hypothyroidism   . PAF (paroxysmal atrial fibrillation) ([facility])    a. s/p DCCV-->maintaining sinus on amiodarone;  b. CHA2DS2VASc = 5-->eliquis.  . Parkinson's disease ([place])    tremors  . Pleural effusion, right    a. 09/2017 s/p thoracentesis.  [name] PNA (pneumonia) 08/26/2017  . Pulmonary embolism ([place]) 2011  . Pulmonary fibrosis ([place])   . 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 Sleep apnea    wears CPAP  .  Squamous cell carcinoma of skin 03/19/2015   Right lateral crown. KA-like pattern  . Thrombocytopenia ([place])    Past Surgical History:  Procedure Laterality Date  . BACK SURGERY  1960  . CARDIAC CATHETERIZATION    . CHOLECYSTECTOMY  2010  . COLONOSCOPY WITH PROPOFOL N/A 06/07/2018   Procedure: COLONOSCOPY WITH PROPOFOL;  Surgeon: Lollie Sails, MD;  Location: [facility] ENDOSCOPY;  Service: Endoscopy;  Laterality: N/A;  . CORONARY ARTERY BYPASS GRAFT  01/07/1997  . CORONARY STENT INTERVENTION N/A 10/31/2016   Procedure: Coronary Stent Intervention;  Surgeon: Wellington Hampshire, MD;  Location: [facility];  Service: Cardiovascular;  Laterality: N/A;  . ELECTROPHYSIOLOGIC STUDY N/A 07/14/2015   Procedure: CARDIOVERSION;  Surgeon: Yolonda Kida, MD;  Location: [facility] ORS;  Service: Cardiovascular;  Laterality: N/A;  . ELECTROPHYSIOLOGIC STUDY N/A 10/12/2015   Procedure: CARDIOVERSION;  Surgeon: Minna Merritts, MD;  Location: [facility] ORS;  Service: Cardiovascular;  Laterality: N/A;  . LEFT HEART CATH AND CORONARY ANGIOGRAPHY N/A 10/31/2016   Procedure: Left Heart Cath and Coronary Angiography;  Surgeon: Wellington Hampshire, MD;  Location: [facility];  Service: Cardiovascular;  Laterality: N/A;  . OTHER SURGICAL HISTORY  1998   Bypass  . RIGHT/LEFT HEART CATH AND CORONARY ANGIOGRAPHY N/A 09/18/2017   Procedure: RIGHT/LEFT HEART CATH AND CORONARY ANGIOGRAPHY;  Surgeon: [name]  A, MD;  Location: [place] [facility];  Service: Cardiovascular;  Laterality: N/A;   Family History  Problem Relation Age of Onset  . Alcohol abuse Father    Social History   Socioeconomic History  . Marital status: Married    Spouse name: [name]  . Number of children: 1  . Years of education: 86  . Highest education level: Not on file  Occupational History  . Occupation: Retired    Comment: Designer, television/film set  Tobacco Use  . Smoking status: Former Smoker    Packs/day: 1.00    Years:  10.00    Pack years: 10.00    Types: Cigarettes, Pipe, Cigars    Quit date: 04/18/1972    Years since quitting: 47.5  . Smokeless tobacco: Former Systems developer    Types: Chew    Quit date: 04/18/1972  Vaping Use  . Vaping Use: Never used  Substance and Sexual Activity  . Alcohol use: Yes    Alcohol/week: 4.0 standard drinks    Types: 2 Cans of beer, 2 Shots of liquor per week    Comment: per 2 weeks   . Drug use: No  . Sexual activity: Yes  Other Topics Concern  . Not on file  Social History Narrative   Lives w/ wife   Caffeine use: none   Right-handed   Social Determinants of Health   Financial Resource Strain:   . Difficulty of Paying Living Expenses:   Food Insecurity:   . Worried About Charity fundraiser in the Last Year:   . Arboriculturist in the Last Year:   Transportation Needs:   . Film/video editor (Medical):   [name] Lack of Transportation (Non-Medical):   Physical Activity:   . Days of Exercise per Week:   . Minutes of Exercise per Session:   Stress:   . Feeling of Stress :   Social Connections:   . Frequency of Communication with Friends and Family:   . Frequency of Social Gatherings with Friends and Family:   . Attends Religious Services:   . Active Member of Clubs or Organizations:   . Attends Archivist Meetings:   [name] Marital Status:   Intimate Partner Violence:   . Fear of Current or Ex-Partner:   . Emotionally Abused:   [name] Physically Abused:   . Sexually Abused:    Current Meds  Medication Sig  . albuterol (PROVENTIL) (2.5 MG/3ML) 0.083% nebulizer solution USE ONE AMPULE(3ML) VIA NEBULIZER EVERY 4 HOURS AS NEEDED FOR WHEEZING OR SHORTNESS OF BREATH  . buPROPion (WELLBUTRIN XL) 300 MG 24 hr tablet Take 1 tablet (300 mg total) by mouth daily.  . carbidopa-levodopa (SINEMET IR) 25-250 MG tablet TAKE TWO TABLETS 3 TIMES DAILY  . carvedilol (COREG) 3.125 MG tablet TAKE TWO TABLETS TWICE A DAY WITH MEALS  . ELIQUIS 5 MG TABS tablet TAKE ONE TABLET BY  MOUTH TWICE DAILY  . entacapone (COMTAN) 200 MG tablet Take 1 tablet (200 mg total) by mouth 3 (three) times daily.  [name] esomeprazole (NEXIUM) 40 MG capsule TAKE 1 CAPSULE BY MOUTH ONCE DAILY  . fluticasone (FLONASE) 50 MCG/ACT nasal spray USE 2 PUFFS IN EACH NOSTRIL DAILY  . furosemide (LASIX) 20 MG tablet TAKE TWO TABLETS TWICE A DAY  . HYDROcodone-acetaminophen (NORCO/VICODIN) 5-325 MG tablet Take by mouth.  . isosorbide mononitrate (IMDUR) 30 MG 24 hr tablet TAKE ONE TABLET BY MOUTH EVERY DAY  . ketoconazole (NIZORAL) 2 % cream   . ketorolac (  ACULAR) 0.5 % ophthalmic solution SMARTSIG:1 Drop(s) In Eye(s) Every 4-6 Hours PRN  . levothyroxine (SYNTHROID) 50 MCG tablet TAKE ONE TABLET ON AN EMPTY STOMACH WITHA GLASS OF WATER AT LEAST 30 TO 60 MINUTES BEFORE BREAKFAST  . losartan (COZAAR) 100 MG tablet Take 1 tablet (100 mg total) by mouth daily.  . mometasone (ELOCON) 0.1 % cream   . MYRBETRIQ 50 MG TB24 tablet TAKE ONE TABLET EVERY DAY  . OFEV 150 MG CAPS Take 1 capsule (150 mg total) by mouth 2 (two) times daily.  . potassium chloride SA (KLOR-CON) 20 MEQ tablet TAKE ONE TABLET EVERY DAY  . PROAIR HFA 108 (90 Base) MCG/ACT inhaler INHALE 2 PUFFS INTO THE LUNGS EVERY SIX HOURS AS NEEDED FOR WHEEZING OR SHORTNESS OF BREATH  . Respiratory Therapy Supplies (FLUTTER) DEVI 1 Device by Does not apply route daily.  . rosuvastatin (CRESTOR) 10 MG tablet TAKE 1 TABLET BY MOUTH DAILY  . Tiotropium Bromide-Olodaterol (STIOLTO RESPIMAT) 2.5-2.5 MCG/ACT AERS Inhale 2 puffs into the lungs daily.   Allergies  Allergen Reactions  . Pravastatin Other (See Comments)  . Prednisone Other (See Comments)    Pt states that med makes him hyper Pt states that med makes him hyper   Recent Results (from the past 2160 hour(s))  SARS CORONAVIRUS 2 (TAT 6-24 HRS) Nasopharyngeal Nasopharyngeal Swab     Status: None   Collection Time: 08/06/19 11:14 AM   Specimen: Nasopharyngeal Swab  Result Value Ref Range   SARS  Coronavirus 2 NEGATIVE NEGATIVE    Comment: (NOTE) SARS-CoV-2 target nucleic acids are NOT DETECTED. The SARS-CoV-2 RNA is generally detectable in upper and lower respiratory specimens during the acute phase of infection. Negative results do not preclude SARS-CoV-2 infection, do not rule out co-infections with other pathogens, and should not be used as the sole basis for treatment or other patient management decisions. Negative results must be combined with clinical observations, patient history, and epidemiological information. The expected result is Negative. Fact Sheet for Patients: SugarRoll.be Fact Sheet for Healthcare Providers: https://www.woods-mathews.com/ This test is not yet approved or cleared by the [place] FDA and  has been authorized for detection and/or diagnosis of SARS-CoV-2 by FDA under an Emergency Use Authorization (EUA). This EUA will remain  in effect (meaning this test can be used) for the duration of the COVID-19 declaration under Section 56 4(b)(1) of the Act, 21 U.S.C. section 360bbb-3(b)(1), unless the authorization is terminated or revoked sooner. Performed at [facility], [place] [address] [place], [place] [address]   Pulmonary function test     Status: None   Collection Time: 08/09/19 12:03 PM  Result Value Ref Range   FVC-Pre 2.55 L   FVC-%Pred-Pre 73 %   FVC-Post 2.56 L   FVC-%Pred-Post 73 %   FVC-%Change-Post 0 %   FEV1-Pre 1.83 L   FEV1-%Pred-Pre 74 %   FEV1-Post 1.86 L   FEV1-%Pred-Post 75 %   FEV1-%Change-Post 1 %   FEV6-Pre 2.55 L   FEV6-%Pred-Pre 78 %   FEV6-Post 2.51 L   FEV6-%Pred-Post 77 %   FEV6-%Change-Post -1 %   Pre FEV1/FVC ratio 72 %   FEV1FVC-%Pred-Pre 100 %   Post FEV1/FVC ratio 73 %   FEV1FVC-%Change-Post 1 %   Pre FEV6/FVC Ratio 100 %   FEV6FVC-%Pred-Pre 108 %   Post FEV6/FVC ratio 100 %   FEV6FVC-%Pred-Post 108 %   FEF 25-75 Pre 1.20 L/sec   FEF2575-%Pred-Pre 72  %   FEF 25-75 Post  1.31 L/sec   FEF2575-%Pred-Post 79 %   FEF2575-%Change-Post 9 %   RV 1.38 L   RV % pred 54 %   TLC 3.91 L   TLC % pred 60 %   DLCO unc 17.19 ml/min/mmHg   DLCO unc % pred 77 %   DLCO cor 17.19 ml/min/mmHg   DLCO cor % pred 77 %   DL/VA 4.42 ml/min/mmHg/L   DL/VA % pred 112 %  CBC with Differential/Platelet     Status: Abnormal   Collection Time: 08/21/19  2:24 PM  Result Value Ref Range   WBC 11.6 (H) 4.0 - 10.5 K/uL   RBC 4.88 4.22 - 5.81 Mil/uL   Hemoglobin 14.8 13.0 - 17.0 g/dL   HCT 45.6 39 - 52 %   MCV 93.4 78.0 - 100.0 fl   MCHC 32.4 30.0 - 36.0 g/dL   RDW 15.2 11.5 - 15.5 %   Platelets 119.0 (L) 150 - 400 K/uL    Comment: Giant Platelets seen on smear.   Neutrophils Relative % 73.8 43 - 77 %   Lymphocytes Relative 16.6 12 - 46 %   Monocytes Relative 7.7 3 - 12 %   Eosinophils Relative 1.4 0 - 5 %   Basophils Relative 0.5 0 - 3 %   Neutro Abs 8.6 (H) 1.4 - 7.7 K/uL   Lymphs Abs 1.9 0.7 - 4.0 K/uL   Monocytes Absolute 0.9 0 - 1 K/uL   Eosinophils Absolute 0.2 0 - 0 K/uL   Basophils Absolute 0.1 0 - 0 K/uL  Uric acid     Status: None   Collection Time: 08/21/19  2:24 PM  Result Value Ref Range   Uric Acid, Serum 5.8 4.0 - 7.8 mg/dL  Basic Metabolic Panel (BMET)     Status: Abnormal   Collection Time: 08/21/19  2:24 PM  Result Value Ref Range   Sodium 138 135 - 145 mEq/L   Potassium 3.9 3.5 - 5.1 mEq/L   Chloride 101 96 - 112 mEq/L   CO2 28 19 - 32 mEq/L   Glucose, Bld 116 (H) 70 - 99 mg/dL   BUN 16 6 - 23 mg/dL   Creatinine, Ser 0.81 0.40 - 1.50 mg/dL   GFR 91.28 >60.00 mL/min   Calcium 9.4 8.4 - 10.5 mg/dL  Hemoglobin A1c     Status: None   Collection Time: 08/21/19  2:24 PM  Result Value Ref Range   Hgb A1c MFr Bld 6.0 4.6 - 6.5 %    Comment: Glycemic Control Guidelines for People with Diabetes:Non Diabetic:  <6%Goal of Therapy: <7%Additional Action Suggested:  >8%   VITAMIN D 25 Hydroxy (Vit-D Deficiency, Fractures)     Status:  Abnormal   Collection Time: 08/21/19  2:24 PM  Result Value Ref Range   VITD 20.43 (L) 30.00 - 100.00 ng/mL  Magnesium     Status: None   Collection Time: 08/21/19  2:24 PM  Result Value Ref Range   Magnesium 1.8 1.5 - 2.5 mg/dL  Albumin     Status: None   Collection Time: 10/16/19 11:09 AM  Result Value Ref Range   Albumin 4.0 3.5 - 5.2 g/dL  Comprehensive metabolic panel     Status: Abnormal   Collection Time: 10/16/19 11:09 AM  Result Value Ref Range   Sodium 136 135 - 145 mEq/L   Potassium 4.0 3.5 - 5.1 mEq/L   Chloride 99 96 - 112 mEq/L   CO2 29 19 - 32 mEq/L  Glucose, Bld 120 (H) 70 - 99 mg/dL   BUN 16 6 - 23 mg/dL   Creatinine, Ser 1.03 0.40 - 1.50 mg/dL   Total Bilirubin 1.0 0.2 - 1.2 mg/dL   Alkaline Phosphatase 66 39 - 117 U/L   AST 14 0 - 37 U/L   ALT 7 0 - 53 U/L   Total Protein 6.5 6.0 - 8.3 g/dL   Albumin 4.0 3.5 - 5.2 g/dL   GFR 69.15 >60.00 mL/min   Calcium 9.4 8.4 - 10.5 mg/dL   Objective  Body mass index is 36.31 kg/m. Wt Readings from Last 3 Encounters:  10/30/19 231 lb 12.8 oz (105.1 kg)  10/16/19 233 lb 6.4 oz (105.9 kg)  09/30/19 239 lb 6.4 oz (108.6 kg)   Temp Readings from Last 3 Encounters:  10/16/19 98 F (36.7 C) (Oral)  09/30/19 (!) 95.6 F (35.3 C) (Temporal)  08/21/19 (!) 95.3 F (35.2 C) (Temporal)   BP Readings from Last 3 Encounters:  10/30/19 106/78  10/16/19 114/68  09/30/19 120/80   Pulse Readings from Last 3 Encounters:  10/30/19 78  10/16/19 84  09/30/19 (!) 55    Physical Exam Vitals and nursing note reviewed.  Constitutional:      Appearance: Normal appearance. He is well-developed and well-groomed.  HENT:     Head: Normocephalic and atraumatic.  Eyes:     Conjunctiva/sclera: Conjunctivae normal.     Pupils: Pupils are equal, round, and reactive to light.  Cardiovascular:     Rate and Rhythm: Normal rate and regular rhythm.     Heart sounds: Normal heart sounds. No murmur heard.   Pulmonary:     Effort:  Pulmonary effort is normal.     Breath sounds: Normal breath sounds.  Skin:    General: Skin is warm and dry.          Comments: Multiple abrasions right forehead, left ab, left forearm worse with left hand cellulitis   Neurological:     General: No focal deficit present.     Mental Status: He is alert and oriented to person, place, and time.  Psychiatric:        Mood and Affect: Mood normal.        Behavior: Behavior is cooperative.        Thought Content: Thought content normal.        Judgment: Judgment normal.     Assessment  Plan  Abrasion - Plan: mupirocin ointment (BACTROBAN) 2 %, doxycycline (VIBRA-TABS) 100 MG tablet Pressure bandage to left forearm   Abnormal gait - Plan: CT Head Wo Contrast Acute nonintractable headache, unspecified headache type - Plan: CT Head Wo Contrast   Provider: Dr. Olivia Mackie McLean-Scocuzza-Internal Medicine

## 2019-10-30 NOTE — Telephone Encounter (Signed)
Spoke with patient/wife regarding the need for lab work to be drawn at 4 weeks, they verbalized that they would like for it to be drawn at [facility].  Order changed to be collected at [facility].  Follow up appointment scheduled for him to see Dr. Vaughan Browner on 12/18/19 at 2:30 pm as an in office visit.  They verbalized understanding.  Nothing further needed.

## 2019-10-30 NOTE — Progress Notes (Signed)
Patient presenting with head injury above the right eye brow. Also has a wound on the left forearm. Patient fell this past Monday due to tripping over concrete in his drive way. Patient is on blood thinners but states is having no pain. Forearm wound still bleeding.

## 2019-10-31 ENCOUNTER — Telehealth: Payer: Self-pay

## 2019-10-31 NOTE — Telephone Encounter (Signed)
Patients spouse called about the 5FU/Calcipotriene. She wants to know why you have chose this topical treatment for this patient. I tried to explain to patients spouse but she would like your explanation please.

## 2019-10-31 NOTE — Telephone Encounter (Signed)
This is one of the best topicals for treatment of PreCancers. It will help clean up the remaining rough PreCancers of his scalp and decrease the number he will grow in the future. If any other specific questions, we will answer.

## 2019-11-04 ENCOUNTER — Telehealth: Payer: Self-pay | Admitting: Primary Care

## 2019-11-04 MED ORDER — OFEV 100 MG PO CAPS: 100.0000 mg | ORAL_CAPSULE | Freq: Two times a day (BID) | ORAL | 5 refills | Status: DC

## 2019-11-04 NOTE — Telephone Encounter (Signed)
Beth, please advise on message from [facility] as well as one from pt's wife about his OFEV. Please advise if you  Want to switch pt to 100mg  of OFEV to see if he is able to tolerate that.

## 2019-11-04 NOTE — Telephone Encounter (Signed)
Yes can we please discontinue 150mg  dose and send in prescription for OFEV 100mg  twice daily to [facility]

## 2019-11-04 NOTE — Telephone Encounter (Signed)
[place] and spoke with Lam stating that we need to change pt's dose from 150mg  to 100mg .  Provided verbal Rx info for OFEV 100mg .  Called pt's wife [name] but unable to reach. Left her a detailed message that we were changing pt from OFEV 150 to OFEV 100 and that [facility] will be calling to get shipment scheduled. Nothing further needed.

## 2019-11-04 NOTE — Telephone Encounter (Signed)
Patient is on Ofev-was told to hold for 7 days d/t appetite issues. Held meds, appetite back, restarted, appetitie issues again. Pt wife wanting to see if we will switch to 100mg  capsules. Please fax or call [facility] with new prescription if changed. [phone] option 2 and then option 1 will get pharmacist. [phone]

## 2019-11-05 ENCOUNTER — Other Ambulatory Visit: Payer: Self-pay | Admitting: Family Medicine

## 2019-11-06 ENCOUNTER — Telehealth: Payer: Self-pay | Admitting: Cardiovascular Disease

## 2019-11-06 NOTE — Telephone Encounter (Signed)
Pt c/o medication issue:  1. Name of Medication: furosemide (LASIX)   2. How are you currently taking this medication (dosage and times per day)? 20 MG 2 tablets twice a day  3. Are you having a reaction (difficulty breathing--STAT)? Hard to control bladder  4. What is your medication issue? Patient spouse calling.  States that patient has been having difficulty controlling bladder.  Patient has been placed on another medication and has lost a lot of weight so it is hard to tell if there is still any swelling issues.  Please call to discuss.

## 2019-11-06 NOTE — Telephone Encounter (Signed)
Spoke with the patients wife. Patient wife sts that the patient has ben having an issue with urinary frequency and urgency. There are time when the patient is not bale to get to the rest in time and will void himself.  The patient has been losing weight due to a decrease in his caloric intake. The patient does weigh daily. He has no signs of swelling. His sob is stable.  Patients wife wants to know if the patient can stop lasix. He is currently taking Lasix 40 mg bid. He will be having and appt with urology soon.  Adv the pt wife that Dr. Fletcher Anon will be out of the office until Mon 11/11/19. The pt could try to reduce his Lasix to 40 mg daily to see if he has improvement over the next few days while we await Dr. Tyrell Antonio return. He should continue daily weights. She is to call the office if his weight goes up 3lbs in a day 5lbs in a week. I will fwd the message to Dr. Fletcher Anon to give his recommendation.  Patients wife verbalized understanding

## 2019-11-07 NOTE — Telephone Encounter (Signed)
LM on VM please return my call  

## 2019-11-10 NOTE — Telephone Encounter (Signed)
I agree with decreasing Lasix to 40 mg daily for now

## 2019-11-11 MED ORDER — FUROSEMIDE 20 MG PO TABS: 40.0000 mg | ORAL_TABLET | Freq: Every day | ORAL | 0 refills | Status: DC

## 2019-11-11 NOTE — Telephone Encounter (Signed)
Called to give the patient Dr. Tyrell Antonio response and recommendation. DPR on file. lmom for the patients wife. They are to contact the office if any questions.

## 2019-11-12 DIAGNOSIS — N401 Enlarged prostate with lower urinary tract symptoms: Secondary | ICD-10-CM | POA: Diagnosis not present

## 2019-11-12 DIAGNOSIS — N399 Disorder of urinary system, unspecified: Secondary | ICD-10-CM | POA: Diagnosis not present

## 2019-11-12 DIAGNOSIS — N3281 Overactive bladder: Secondary | ICD-10-CM | POA: Diagnosis not present

## 2019-11-12 DIAGNOSIS — N310 Uninhibited neuropathic bladder, not elsewhere classified: Secondary | ICD-10-CM | POA: Diagnosis not present

## 2019-11-18 ENCOUNTER — Other Ambulatory Visit: Payer: Self-pay | Admitting: Cardiovascular Disease

## 2019-11-20 ENCOUNTER — Other Ambulatory Visit: Payer: Self-pay | Admitting: Cardiovascular Disease

## 2019-11-23 ENCOUNTER — Other Ambulatory Visit: Payer: Self-pay

## 2019-11-23 ENCOUNTER — Ambulatory Visit (INDEPENDENT_AMBULATORY_CARE_PROVIDER_SITE_OTHER): Payer: PPO

## 2019-11-23 ENCOUNTER — Ambulatory Visit
Admission: EM | Admit: 2019-11-23 | Discharge: 2019-11-23 | Disposition: A | Discharge status: Home / Self Care | Payer: PPO | Attending: Emergency Medicine | Admitting: Emergency Medicine

## 2019-11-23 ENCOUNTER — Encounter: Payer: Self-pay | Admitting: Gynecology

## 2019-11-23 DIAGNOSIS — M533 Sacrococcygeal disorders, not elsewhere classified: Secondary | ICD-10-CM | POA: Diagnosis not present

## 2019-11-23 DIAGNOSIS — S300XXA Contusion of lower back and pelvis, initial encounter: Secondary | ICD-10-CM

## 2019-11-23 DIAGNOSIS — S3210XA Unspecified fracture of sacrum, initial encounter for closed fracture: Secondary | ICD-10-CM | POA: Diagnosis not present

## 2019-11-23 MED ORDER — HYDROCODONE-ACETAMINOPHEN 5-325 MG PO TABS: 1.0000 | ORAL_TABLET | Freq: Four times a day (QID) | ORAL | 0 refills | Status: DC | PRN

## 2019-11-23 NOTE — ED Triage Notes (Signed)
Per patient fell off rolling chair at home x 4 days ago.. Patient state lower back pain /spinal pain.

## 2019-11-23 NOTE — Discharge Instructions (Addendum)
Take a Tylenol containing product 3-4 times a day.  Take 1000 mg of Tylenol for mild to moderate pain or you can take 1 Norco for severe pain.  Do not exceed 4000 mg of Tylenol from all sources in 1 day.  Donut pillow.  Push fluids.  MiraLAX and Colace to help keep your stool soft.  It may be very painful when you defecate if you do indeed have a break.  CT or MRI will be more sensitive, and while we did not find a displaced fracture, he may have a hairline fracture.  Unfortunately, there is not a whole lot to do for this.

## 2019-11-23 NOTE — ED Provider Notes (Signed)
HPI  SUBJECTIVE:  Andre [name] is a 84 y.o. male who presents with 4 days of coccygeal/low back pain after sliding off of a low rolling chair, falling directly onto hard floor.  States that initially landed on his coccyx and his back.  He describes the coccygeal pain is sore.  He denies bruising.  He has been ambulatory since the fall.  He has been sitting on a donut pillow, tried oxycodone topical arthritis medication.  Symptoms better with lying down flat, worse with moving around, sitting, getting out of bed or chair, walking, defecation.  He also states that his left shoulder is sore but is able to move it through full range of motion.  He has a past medical history of Parkinson's, coronary disease status post CABG, paroxysmal atrial fibrillation on Eliquis, hypercholesterolemia, CHF, COPD/pulmonary fibrosis, PE.  [id number], Angela Adam, MD   Past Medical History:  Diagnosis Date  . Atherosclerosis of abdominal aorta ([place])   . Basal cell carcinoma 03/04/2008   Right nose supratip.   [name] CAD (coronary artery disease)   . Cervical spondylosis 10/01/2013  . Chronic diastolic CHF (congestive heart failure) ([place])    a. 07/2016 Echo: >55%; b. 10/2016 Echo: EF 55-60%, Gr1 DD, Ao sclerosis w/o stenosis, sev dil LA; c. 08/2017 Echo: EF 60-65%, no rwma, Gr2 DD, mild AS, sev dil LA/RA.  [name] Coronary artery disease    a. 1998 s/p mini-cabg @ [facility] - LIMA->LAD;  b. 07/2016 St Echo: Inadequate HR w/ HTN response;  c.  08/2016 MV: EF 67%, no ischemia; d. 10/2016 NSTEMI/Cath: RCA 95p (4.0x26 Onyx DES), LIMA->LAD nl; e. 09/2017 Cath: LM 40/30, LAD 100ost, RI 80, LCX nl, OM2/3 nl, RCA patent stent, 42m, LIMA->LAD nl-->Med Rx.  . DDD (degenerative disc disease), cervical   . DDD (degenerative disc disease), lumbar   . Depression   . Gait abnormality 07/31/2019  . GERD (gastroesophageal reflux disease)   . Hyperlipidemia   . Hypertension   . Hypothyroidism   . PAF (paroxysmal atrial fibrillation) ([facility])    a. s/p  DCCV-->maintaining sinus on amiodarone;  b. CHA2DS2VASc = 5-->eliquis.  . Parkinson's disease ([place])    tremors  . Pleural effusion, right    a. 09/2017 s/p thoracentesis.  [name] PNA (pneumonia) 08/26/2017  . Pulmonary embolism ([place]) 2011  . Pulmonary fibrosis ([place])   . 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 Sleep apnea    wears CPAP  . Squamous cell carcinoma of skin 03/19/2015   Right lateral crown. KA-like pattern  . Thrombocytopenia ([place])     Past Surgical History:  Procedure Laterality Date  . BACK SURGERY  1960  . CARDIAC CATHETERIZATION    . CHOLECYSTECTOMY  2010  . COLONOSCOPY WITH PROPOFOL N/A 06/07/2018   Procedure: COLONOSCOPY WITH PROPOFOL;  Surgeon: Lollie Sails, MD;  Location: [facility] ENDOSCOPY;  Service: Endoscopy;  Laterality: N/A;  . CORONARY ARTERY BYPASS GRAFT  01/07/1997  . CORONARY STENT INTERVENTION N/A 10/31/2016   Procedure: Coronary Stent Intervention;  Surgeon: Wellington Hampshire, MD;  Location: [place] CV LAB;  Service: Cardiovascular;  Laterality: N/A;  . ELECTROPHYSIOLOGIC STUDY N/A 07/14/2015   Procedure: CARDIOVERSION;  Surgeon: Yolonda Kida, MD;  Location: [facility] ORS;  Service: Cardiovascular;  Laterality: N/A;  . ELECTROPHYSIOLOGIC STUDY N/A 10/12/2015   Procedure: CARDIOVERSION;  Surgeon: Minna Merritts, MD;  Location: [facility] ORS;  Service: Cardiovascular;  Laterality: N/A;  . LEFT HEART CATH AND CORONARY ANGIOGRAPHY N/A 10/31/2016   Procedure: Left Heart Cath and  Coronary Angiography;  Surgeon: Wellington Hampshire, MD;  Location: [place] [facility] LAB;  Service: Cardiovascular;  Laterality: N/A;  . OTHER SURGICAL HISTORY  1998   Bypass  . RIGHT/LEFT HEART CATH AND CORONARY ANGIOGRAPHY N/A 09/18/2017   Procedure: RIGHT/LEFT HEART CATH AND CORONARY ANGIOGRAPHY;  Surgeon: Wellington Hampshire, MD;  Location: [place] [facility];  Service: Cardiovascular;  Laterality: N/A;    Family History  Problem Relation Age of Onset  . Alcohol abuse Father      Social History   Tobacco Use  . Smoking status: Former Smoker    Packs/day: 1.00    Years: 10.00    Pack years: 10.00    Types: Cigarettes, Pipe, Cigars    Quit date: 04/18/1972    Years since quitting: 47.6  . Smokeless tobacco: Former Systems developer    Types: Chew    Quit date: 04/18/1972  Vaping Use  . Vaping Use: Never used  Substance Use Topics  . Alcohol use: Yes    Alcohol/week: 4.0 standard drinks    Types: 2 Cans of beer, 2 Shots of liquor per week    Comment: per 2 weeks   . Drug use: No    No current facility-administered medications for this encounter.  Current Outpatient Medications:  .  albuterol (PROVENTIL) (2.5 MG/3ML) 0.083% nebulizer solution, USE ONE AMPULE(3ML) VIA NEBULIZER EVERY 4 HOURS AS NEEDED FOR WHEEZING OR SHORTNESS OF BREATH, Disp: 375 mL, Rfl: 11 .  buPROPion (WELLBUTRIN XL) 300 MG 24 hr tablet, Take 1 tablet (300 mg total) by mouth daily., Disp: 90 tablet, Rfl: 1 .  carbidopa-levodopa (SINEMET IR) 25-250 MG tablet, TAKE TWO TABLETS 3 TIMES DAILY, Disp: 540 tablet, Rfl: 3 .  carvedilol (COREG) 3.125 MG tablet, TAKE TWO TABLETS TWICE A DAY WITH MEALS, Disp: 120 tablet, Rfl: 2 .  ELIQUIS 5 MG TABS tablet, TAKE ONE TABLET BY MOUTH TWICE DAILY, Disp: 60 tablet, Rfl: 5 .  entacapone (COMTAN) 200 MG tablet, Take 1 tablet (200 mg total) by mouth 3 (three) times daily., Disp: 270 tablet, Rfl: 3 .  esomeprazole (NEXIUM) 40 MG capsule, TAKE 1 CAPSULE BY MOUTH ONCE DAILY, Disp: 30 capsule, Rfl: 2 .  fluticasone (FLONASE) 50 MCG/ACT nasal spray, USE 2 PUFFS IN EACH NOSTRIL DAILY, Disp: 16 g, Rfl: 3 .  furosemide (LASIX) 20 MG tablet, Take 2 tablets (40 mg total) by mouth daily., Disp: , Rfl: 0 .  isosorbide mononitrate (IMDUR) 30 MG 24 hr tablet, TAKE ONE TABLET BY MOUTH EVERY DAY, Disp: 30 tablet, Rfl: 0 .  ketoconazole (NIZORAL) 2 % cream, , Disp: , Rfl:  .  ketorolac (ACULAR) 0.5 % ophthalmic solution, SMARTSIG:1 Drop(s) In Eye(s) Every 4-6 Hours PRN, Disp: , Rfl:   .  levothyroxine (SYNTHROID) 50 MCG tablet, TAKE ONE TABLET ON AN EMPTY STOMACH WITHA GLASS OF WATER AT LEAST 30 TO 60 MINUTES BEFORE BREAKFAST, Disp: 90 tablet, Rfl: 1 .  losartan (COZAAR) 100 MG tablet, TAKE 1 TABLET BY MOUTH DAILY, Disp: 90 tablet, Rfl: 1 .  mometasone (ELOCON) 0.1 % cream, , Disp: , Rfl:  .  mupirocin ointment (BACTROBAN) 2 %, Apply 1 application topically 3 (three) times daily., Disp: 30 g, Rfl: 2 .  MYRBETRIQ 50 MG TB24 tablet, TAKE ONE TABLET EVERY DAY, Disp: 30 tablet, Rfl: 11 .  Nintedanib (OFEV) 100 MG CAPS, Take 1 capsule (100 mg total) by mouth 2 (two) times daily., Disp: 60 capsule, Rfl: 5 .  potassium chloride SA (KLOR-CON) 20 MEQ  tablet, TAKE ONE TABLET EVERY DAY, Disp: 90 tablet, Rfl: 0 .  PROAIR HFA 108 (90 Base) MCG/ACT inhaler, INHALE 2 PUFFS INTO THE LUNGS EVERY SIX HOURS AS NEEDED FOR WHEEZING OR SHORTNESS OF BREATH, Disp: 8.5 g, Rfl: 1 .  Respiratory Therapy Supplies (FLUTTER) DEVI, 1 Device by Does not apply route daily., Disp: 1 each, Rfl: 0 .  rosuvastatin (CRESTOR) 10 MG tablet, TAKE 1 TABLET BY MOUTH DAILY, Disp: 90 tablet, Rfl: 0 .  Tiotropium Bromide-Olodaterol (STIOLTO RESPIMAT) 2.5-2.5 MCG/ACT AERS, Inhale 2 puffs into the lungs daily., Disp: 1 g, Rfl: 5 .  HYDROcodone-acetaminophen (NORCO/VICODIN) 5-325 MG tablet, Take 1 tablet by mouth every 6 (six) hours as needed for moderate pain or severe pain., Disp: 12 tablet, Rfl: 0  Allergies  Allergen Reactions  . Pravastatin Other (See Comments)  . Prednisone Other (See Comments)    Pt states that med makes him hyper Pt states that med makes him hyper     ROS  As noted in HPI.   Physical Exam  BP 102/90 (BP Location: Left Arm)   Pulse 73   Temp 98 F (36.7 C) (Oral)   Resp 16   Wt 100.7 kg   SpO2 100%   BMI 34.77 kg/m   Constitutional: Well developed, well nourished, no acute distress Eyes:  EOMI, conjunctiva normal bilaterally HENT: Normocephalic, atraumatic,mucus membranes  moist Respiratory: Normal inspiratory effort Cardiovascular: Normal rate GI: nondistended skin: No rash, skin intact Musculoskeletal: No C-spine T-spine L spine tenderness.  No SI joint tenderness, sacral tenderness.  positive coccygeal tenderness. Ambulatory. No tenderness over the entire shoulder.  Patient has full range of motion left shoulder.  Neurologic: Alert & oriented x 3, no focal neuro deficits Psychiatric: Speech and behavior appropriate   ED Course   Medications - No data to display  Orders Placed This Encounter  Procedures  . DG Sacrum/Coccyx    Standing Status:   Standing    Number of Occurrences:   1    Order Specific Question:   Reason for Exam (SYMPTOM  OR DIAGNOSIS REQUIRED)    Answer:   trauma r/o fx    No results found for this or any previous visit (from the past 60 hour(s)). DG Sacrum/Coccyx  Result Date: 11/23/2019 CLINICAL DATA:  Trauma, fall from chair EXAM: SACRUM AND COCCYX - 2+ VIEW COMPARISON:  None. FINDINGS: Osteopenia, which significantly limits radiographic sensitivity for hip, pelvic, and sacral fractures. Within this limitation, there is no obvious displaced fracture of the sacrum, coccyx, or included portions of the pelvis. Large burden of stool and stool balls in the overlying colon and rectum. IMPRESSION: 1. Osteopenia, which significantly limits radiographic sensitivity for hip, pelvic, and sacral fractures. Within this limitation, there is no obvious displaced fracture of the sacrum, coccyx, or included portions of the pelvis. Consider CT or MRI to more sensitively evaluate for fracture if there is high clinical concern. 2. Large burden of stool and stool balls in the overlying colon and rectum. Electronically Signed   By: Eddie Candle M.D.   On: 11/23/2019 17:07    ED Clinical Impression  1. Contusion of coccyx, initial encounter      ED Assessment/Plan   We will image sacrum/coccyx as this is tender.  Is no tenderness over the L-spine  imaging with this was deferred.  He is also complaining of left shoulder pain however he is able to move it through full range of motion without any problem and he has no tenderness  over the entire shoulder.  Deferring imaging.    Reviewed imaging independently.  Osteopenia.  No obvious displaced fracture of the sacrum, coccyx or included portions of the pelvis.  Large stool burden.  See radiology report for full details.  Patient has no radiological signs of displaced fracture, however this study is limited.  Clinically, I suspect a hairline fracture.  Discussed this with patient and caregiver.  Discussed with them that they will need a CT or MRI to fully evaluate for fracture.  Discussed with him that however there is not much to do for this other than stool softeners, donut pillow, Tylenol.  Will send home with Norco for severe pain although advised him that this would increase his constipation.  MiraLAX, Colace.  Follow-up with PMD as needed.  Discussed imaging, MDM, treatment plan, and plan for follow-up with family and patient. family agrees with plan.   Meds ordered this encounter  Medications  . DISCONTD: HYDROcodone-acetaminophen (NORCO/VICODIN) 5-325 MG tablet    Sig: Take 1-2 tablets by mouth every 6 (six) hours as needed for moderate pain or severe pain.    Dispense:  12 tablet    Refill:  0  . HYDROcodone-acetaminophen (NORCO/VICODIN) 5-325 MG tablet    Sig: Take 1 tablet by mouth every 6 (six) hours as needed for moderate pain or severe pain.    Dispense:  12 tablet    Refill:  0    *This clinic note was created using [employer]. Therefore, there may be occasional mistakes despite careful proofreading.   ?    Melynda Ripple, MD 11/25/19 [phone]

## 2019-11-27 ENCOUNTER — Encounter: Payer: Self-pay | Admitting: Family Medicine

## 2019-11-27 ENCOUNTER — Other Ambulatory Visit: Payer: Self-pay

## 2019-11-27 ENCOUNTER — Ambulatory Visit (INDEPENDENT_AMBULATORY_CARE_PROVIDER_SITE_OTHER): Payer: PPO | Admitting: Family Medicine

## 2019-11-27 DIAGNOSIS — M533 Sacrococcygeal disorders, not elsewhere classified: Secondary | ICD-10-CM

## 2019-11-27 MED ORDER — TRAMADOL HCL 50 MG PO TABS: 50.0000 mg | ORAL_TABLET | Freq: Three times a day (TID) | ORAL | 0 refills | Status: AC | PRN

## 2019-11-27 NOTE — Assessment & Plan Note (Addendum)
Previous x-rays with no fracture though the study was limited with osteopenia.  Suspect possible fracture contributing to discomfort versus bone bruise.  I do not believe advanced imaging is necessary as treatment would be conservative either way at this time.  Discussed continuing the doughnut pillow.  We will try tramadol to see if that is beneficial for his pain.  He will not take any hydrocodone with the tramadol.  Advised of potential drowsiness with the tramadol.  They asked if a steroid injection would be beneficial for this and I do not think it would provide significant benefit and would also likely inhibit fracture healing.  He will monitor his symptoms and if not improving in the next several weeks he will let us know.

## 2019-11-27 NOTE — Patient Instructions (Signed)
Nice to see you. Please continue with a doughnut pillow. We will try tramadol to see if this will help with your pain.  If this is not beneficial when you first take it please contact us and we can give additional instructions. If there is a fracture it can take 4 to 6 weeks to heal.  If you continue to have pain over the next several weeks please let us know.

## 2019-11-27 NOTE — Progress Notes (Signed)
  [name], MD Phone: [phone]  Andre [name] is a 84 y.o. male who presents today for same day visit.   Tailbone pain: Patient notes his chair slipped out from under him on 11/19/2019.  He has had tailbone pain since then.  He went to urgent care and had an x-ray which revealed osteopenia which they noted significantly limits the ability to detect a fracture though they noted no displaced fracture was detected.  They prescribed hydrocodone and the patient notes 1 tablet is not enough and 2 tablets makes him feel weird.  He has been using a doughnut pillow though notes no significant benefit from that.  Social History   Tobacco Use  Smoking Status Former Smoker  . Packs/day: 1.00  . Years: 10.00  . Pack years: 10.00  . Types: Cigarettes, Pipe, Cigars  . Quit date: 04/18/1972  . Years since quitting: 47.6  Smokeless Tobacco Former Systems developer  . Types: Chew  . Quit date: 04/18/1972     ROS see history of present illness  Objective  Physical Exam Vitals:   11/27/19 1026  BP: 130/70  Pulse: 78  Temp: 97.6 F (36.4 C)    BP Readings from Last 3 Encounters:  11/27/19 130/70  11/23/19 102/90  10/30/19 106/78   Wt Readings from Last 3 Encounters:  11/27/19 224 lb 9.6 oz (101.9 kg)  11/23/19 222 lb (100.7 kg)  10/30/19 231 lb 12.8 oz (105.1 kg)    Physical Exam Musculoskeletal:     Comments: Patient is tender over his coccyx, no overlying skin abnormalities, no palpable bony defects      Assessment/Plan: Please see individual problem list.  Coccydynia Previous x-rays with no fracture though the study was limited with osteopenia.  Suspect possible fracture contributing to discomfort versus bone bruise.  I do not believe advanced imaging is necessary as treatment would be conservative either way at this time.  Discussed continuing the doughnut pillow.  We will try tramadol to see if that is beneficial for his pain.  He will not take any hydrocodone with the tramadol.   Advised of potential drowsiness with the tramadol.  They asked if a steroid injection would be beneficial for this and I do not think it would provide significant benefit and would also likely inhibit fracture healing.  He will monitor his symptoms and if not improving in the next several weeks he will let us know.    No orders of the defined types were placed in this encounter.   Meds ordered this encounter  Medications  . traMADol (ULTRAM) 50 MG tablet    Sig: Take 1 tablet (50 mg total) by mouth every 8 (eight) hours as needed for up to 5 days.    Dispense:  15 tablet    Refill:  0    This visit occurred during the SARS-CoV-2 public health emergency.  Safety protocols were in place, including screening questions prior to the visit, additional usage of staff PPE, and extensive cleaning of exam room while observing appropriate contact time as indicated for disinfecting solutions.    [name], MD [place]

## 2019-11-29 ENCOUNTER — Other Ambulatory Visit
Admission: RE | Admit: 2019-11-29 | Discharge: 2019-11-29 | Disposition: A | Discharge status: Home / Self Care | Payer: PPO | Attending: Primary Care | Admitting: Primary Care

## 2019-11-29 DIAGNOSIS — Z5181 Encounter for therapeutic drug level monitoring: Secondary | ICD-10-CM | POA: Diagnosis not present

## 2019-11-29 LAB — HEPATIC FUNCTION PANEL
ALT: 11 U/L (ref 0–44)
AST: 23 U/L (ref 15–41)
Albumin: 3.7 g/dL (ref 3.5–5.0)
Alkaline Phosphatase: 70 U/L (ref 38–126)
Bilirubin, Direct: 0.2 mg/dL (ref 0.0–0.2)
Indirect Bilirubin: 1 mg/dL — ABNORMAL HIGH (ref 0.3–0.9)
Total Bilirubin: 1.2 mg/dL (ref 0.3–1.2)
Total Protein: 6.7 g/dL (ref 6.5–8.1)

## 2019-12-03 ENCOUNTER — Telehealth: Payer: Self-pay | Admitting: Family Medicine

## 2019-12-03 NOTE — Telephone Encounter (Signed)
Pt states that tramadol is not helping with tailbone pain. Please advise

## 2019-12-04 NOTE — Telephone Encounter (Signed)
Noted. He could try tramadol 100 mg every 8 hours. That would be 2 tablets of the tramadol 50 mg.

## 2019-12-04 NOTE — Telephone Encounter (Signed)
I called and spoke with the patient and informed him to take 2 tablets of tramadol 50 mg  every 8 hours and he understood.  Lexee Brashears,cma

## 2019-12-09 ENCOUNTER — Other Ambulatory Visit: Payer: Self-pay

## 2019-12-09 ENCOUNTER — Inpatient Hospital Stay: Payer: PPO | Attending: Internal Medicine

## 2019-12-09 ENCOUNTER — Inpatient Hospital Stay (HOSPITAL_BASED_OUTPATIENT_CLINIC_OR_DEPARTMENT_OTHER): Payer: PPO | Admitting: Internal Medicine

## 2019-12-09 ENCOUNTER — Inpatient Hospital Stay: Payer: PPO

## 2019-12-09 DIAGNOSIS — D696 Thrombocytopenia, unspecified: Secondary | ICD-10-CM | POA: Insufficient documentation

## 2019-12-09 DIAGNOSIS — I2510 Atherosclerotic heart disease of native coronary artery without angina pectoris: Secondary | ICD-10-CM | POA: Insufficient documentation

## 2019-12-09 DIAGNOSIS — Z7901 Long term (current) use of anticoagulants: Secondary | ICD-10-CM | POA: Diagnosis not present

## 2019-12-09 DIAGNOSIS — I480 Paroxysmal atrial fibrillation: Secondary | ICD-10-CM | POA: Diagnosis not present

## 2019-12-09 DIAGNOSIS — Z79899 Other long term (current) drug therapy: Secondary | ICD-10-CM | POA: Diagnosis not present

## 2019-12-09 DIAGNOSIS — D751 Secondary polycythemia: Secondary | ICD-10-CM | POA: Diagnosis not present

## 2019-12-09 LAB — COMPREHENSIVE METABOLIC PANEL
ALT: 6 U/L (ref 0–44)
AST: 18 U/L (ref 15–41)
Albumin: 3.6 g/dL (ref 3.5–5.0)
Alkaline Phosphatase: 71 U/L (ref 38–126)
Anion gap: 8 (ref 5–15)
BUN: 18 mg/dL (ref 8–23)
CO2: 29 mmol/L (ref 22–32)
Calcium: 8.2 mg/dL — ABNORMAL LOW (ref 8.9–10.3)
Chloride: 100 mmol/L (ref 98–111)
Creatinine, Ser: 0.96 mg/dL (ref 0.61–1.24)
GFR calc Af Amer: >60 mL/min (ref >60)
GFR calc non Af Amer: >60 mL/min (ref >60)
Glucose, Bld: 111 mg/dL — ABNORMAL HIGH (ref 70–99)
Potassium: 3.8 mmol/L (ref 3.5–5.1)
Sodium: 137 mmol/L (ref 135–145)
Total Bilirubin: 0.9 mg/dL (ref 0.3–1.2)
Total Protein: 6.4 g/dL — ABNORMAL LOW (ref 6.5–8.1)

## 2019-12-09 LAB — CBC WITH DIFFERENTIAL/PLATELET
Abs Immature Granulocytes: 0.03 10*3/uL (ref 0.00–0.07)
Basophils Absolute: 0 10*3/uL (ref 0.0–0.1)
Basophils Relative: 0 %
Eosinophils Absolute: 0.1 10*3/uL (ref 0.0–0.5)
Eosinophils Relative: 1 %
HCT: 44 % (ref 39.0–52.0)
Hemoglobin: 14.7 g/dL (ref 13.0–17.0)
Immature Granulocytes: 0 %
Lymphocytes Relative: 18 %
Lymphs Abs: 2 10*3/uL (ref 0.7–4.0)
MCH: 30.9 pg (ref 26.0–34.0)
MCHC: 33.4 g/dL (ref 30.0–36.0)
MCV: 92.4 fL (ref 80.0–100.0)
Monocytes Absolute: 0.8 10*3/uL (ref 0.1–1.0)
Monocytes Relative: 8 %
Neutro Abs: 8 10*3/uL — ABNORMAL HIGH (ref 1.7–7.7)
Neutrophils Relative %: 73 %
Platelets: 137 10*3/uL — ABNORMAL LOW (ref 150–400)
RBC: 4.76 MIL/uL (ref 4.22–5.81)
RDW: 14.1 % (ref 11.5–15.5)
Smear Review: NORMAL
WBC: 10.9 10*3/uL — ABNORMAL HIGH (ref 4.0–10.5)
nRBC: 0 % (ref 0.0–0.2)

## 2019-12-09 NOTE — Assessment & Plan Note (Addendum)
#   Mild thrombocytopenia platelets-137  question secondary to alcohol versus liver disease.vs ITP.  Stable.  Monitor for now.  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 STABLE.; HCT -44;  HOLD phlebotomy.   # CAD/-stenting [Dr.Arida] A. Fib- on Eliquis per cardiology/ see above. STABLE.   #Interstitial lung disease- on Nintedanib-lobar pulmonary.  Stable  # I spoke to his wife; the above plan of care.  # DISPOSITION:  # NO Phlebotomy today # Follow up in  6 months-MD labs- cbc/cmp- Dr.B  Cc; Dr.Sonnenberg

## 2019-12-09 NOTE — Progress Notes (Signed)
[place] OFFICE PROGRESS NOTE  Patient Care Team: [name], MD as PCP - General (Family Medicine) Wellington Hampshire, MD as PCP - Cardiology (Cardiology)   SUMMARY OF ONCOLOGIC HISTORY: # Mild Thrombocytopenia- 120-130s. ? Alcohol/fatty liver/? 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  INTERVAL HISTORY:  84 year old male patient with above history of secondary erythrocytosis from prior testosterone therapy/CPAP; intermittent thrombocytopenia and history of A. fib on Elquis here for follow-up.   Patient continues to be on tyrosine kinase inhibitor for his interstitial lung disease/pulmonary fibrosis.  Denies any worsening shortness of breath or cough he denies any bleeding.  No strokes no falls.   Review of Systems  Constitutional: Negative for chills, diaphoresis, fever, malaise/fatigue and weight loss.  HENT: Negative for nosebleeds and sore throat.   Eyes: Negative for double vision.  Respiratory: Negative for cough, hemoptysis, sputum production, shortness of breath and wheezing.   Cardiovascular: Negative for chest pain, palpitations, orthopnea and leg swelling.  Gastrointestinal: Negative for abdominal pain, blood in stool, constipation, diarrhea, heartburn, melena, nausea and vomiting.  Genitourinary: Negative for dysuria, frequency and urgency.  Musculoskeletal: Positive for back pain and joint pain.  Skin: Negative.  Negative for itching and rash.  Neurological: Negative for dizziness, tingling, focal weakness, weakness and headaches.  Endo/Heme/Allergies: Does not bruise/bleed easily.  Psychiatric/Behavioral: Negative for depression. The  patient is not nervous/anxious and does not have insomnia.      PAST MEDICAL HISTORY :  Past Medical History:  Diagnosis Date  . Atherosclerosis of abdominal aorta ([place])   . Basal cell carcinoma 03/04/2008   Right nose supratip.   [name] CAD (coronary artery disease)   . Cervical spondylosis 10/01/2013  . Chronic diastolic CHF (congestive heart failure) ([place])    a. 07/2016 Echo: >55%; b. 10/2016 Echo: EF 55-60%, Gr1 DD, Ao sclerosis w/o stenosis, sev dil LA; c. 08/2017 Echo: EF 60-65%, no rwma, Gr2 DD, mild AS, sev dil LA/RA.  [name] Coronary artery disease    a. 1998 s/p mini-cabg @ [facility] - LIMA->LAD;  b. 07/2016 St Echo: Inadequate HR w/ HTN response;  c.  08/2016 MV: EF 67%, no ischemia; d. 10/2016 NSTEMI/Cath: RCA 95p (4.0x26 Onyx DES), LIMA->LAD nl; e. 09/2017 Cath: LM 40/30, LAD 100ost, RI 80, LCX nl, OM2/3 nl, RCA patent stent, 2m, LIMA->LAD nl-->Med Rx.  . DDD (degenerative disc disease), cervical   . DDD (degenerative disc disease), lumbar   . Depression   . Gait abnormality 07/31/2019  . GERD (gastroesophageal reflux disease)   . Hyperlipidemia   . Hypertension   . Hypothyroidism   . PAF (paroxysmal atrial fibrillation) (HCC)    a. s/p DCCV-->maintaining sinus on amiodarone;  b. CHA2DS2VASc = 5-->eliquis.  . Parkinson's disease ([place])    tremors  . Pleural effusion, right    a. 09/2017 s/p thoracentesis.  [name] PNA (pneumonia) 08/26/2017  . Pulmonary embolism ([place]) 2011  . Pulmonary fibrosis ([place])   . 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 Sleep apnea    wears CPAP  . Squamous cell carcinoma of skin 03/19/2015   Right lateral crown. KA-like pattern  . Thrombocytopenia ([place])     PAST SURGICAL HISTORY :   Past Surgical History:  Procedure Laterality Date  .  BACK SURGERY  1960  . CARDIAC CATHETERIZATION    . CHOLECYSTECTOMY  2010  . COLONOSCOPY WITH PROPOFOL N/A 06/07/2018   Procedure: COLONOSCOPY WITH PROPOFOL;  Surgeon: Lollie Sails, MD;  Location[facility] ENDOSCOPY;  Service:  Endoscopy;  Laterality: N/A;  . CORONARY ARTERY BYPASS GRAFT  01/07/1997  . CORONARY STENT INTERVENTION N/A 10/31/2016   Procedure: Coronary Stent Intervention;  Surgeon: Wellington Hampshire, MD;  Location: [place] CV LAB;  Service: Cardiovascular;  Laterality: N/A;  . ELECTROPHYSIOLOGIC STUDY N/A 07/14/2015   Procedure: CARDIOVERSION;  Surgeon: Yolonda Kida, MD;  Location: [facility] ORS;  Service: Cardiovascular;  Laterality: N/A;  . ELECTROPHYSIOLOGIC STUDY N/A 10/12/2015   Procedure: CARDIOVERSION;  Surgeon: Minna Merritts, MD;  Location: [facility] ORS;  Service: Cardiovascular;  Laterality: N/A;  . LEFT HEART CATH AND CORONARY ANGIOGRAPHY N/A 10/31/2016   Procedure: Left Heart Cath and Coronary Angiography;  Surgeon: Wellington Hampshire, MD;  Location: [place] CV LAB;  Service: Cardiovascular;  Laterality: N/A;  . OTHER SURGICAL HISTORY  1998   Bypass  . RIGHT/LEFT HEART CATH AND CORONARY ANGIOGRAPHY N/A 09/18/2017   Procedure: RIGHT/LEFT HEART CATH AND CORONARY ANGIOGRAPHY;  Surgeon: Wellington Hampshire, MD;  Location: [place] CV LAB;  Service: Cardiovascular;  Laterality: N/A;    FAMILY HISTORY :   Family History  Problem Relation Age of Onset  . Alcohol abuse Father     SOCIAL HISTORY:   Social History   Tobacco Use  . Smoking status: Former Smoker    Packs/day: 1.00    Years: 10.00    Pack years: 10.00    Types: Cigarettes, Pipe, Cigars    Quit date: 04/18/1972    Years since quitting: 47.6  . Smokeless tobacco: Former Systems developer    Types: Chew    Quit date: 04/18/1972  Vaping Use  . Vaping Use: Never used  Substance Use Topics  . Alcohol use: Yes    Alcohol/week: 4.0 standard drinks    Types: 2 Cans of beer, 2 Shots of liquor per week    Comment: per 2 weeks   . Drug use: No    ALLERGIES:  is allergic to pravastatin and prednisone.  MEDICATIONS:  Current Outpatient Medications  Medication Sig Dispense Refill  . albuterol (PROVENTIL) (2.5 MG/3ML) 0.083% nebulizer  solution USE ONE AMPULE(3ML) VIA NEBULIZER EVERY 4 HOURS AS NEEDED FOR WHEEZING OR SHORTNESS OF BREATH 375 mL 11  . buPROPion (WELLBUTRIN XL) 300 MG 24 hr tablet Take 1 tablet (300 mg total) by mouth daily. 90 tablet 1  . carbidopa-levodopa (SINEMET IR) 25-250 MG tablet TAKE TWO TABLETS 3 TIMES DAILY 540 tablet 3  . carvedilol (COREG) 3.125 MG tablet TAKE TWO TABLETS TWICE A DAY WITH MEALS 120 tablet 2  . ELIQUIS 5 MG TABS tablet TAKE ONE TABLET BY MOUTH TWICE DAILY 60 tablet 5  . entacapone (COMTAN) 200 MG tablet Take 1 tablet (200 mg total) by mouth 3 (three) times daily. 270 tablet 3  . esomeprazole (NEXIUM) 40 MG capsule TAKE 1 CAPSULE BY MOUTH ONCE DAILY 30 capsule 2  . fluticasone (FLONASE) 50 MCG/ACT nasal spray USE 2 PUFFS IN EACH NOSTRIL DAILY 16 g 3  . furosemide (LASIX) 20 MG tablet Take 2 tablets (40 mg total) by mouth daily.  0  . HYDROcodone-acetaminophen (NORCO/VICODIN) 5-325 MG tablet Take 1 tablet by mouth every 6 (six) hours as needed for moderate pain or severe pain. 12 tablet 0  . isosorbide mononitrate (IMDUR) 30 MG  24 hr tablet TAKE ONE TABLET BY MOUTH EVERY DAY 30 tablet 0  . ketoconazole (NIZORAL) 2 % cream     . ketorolac (ACULAR) 0.5 % ophthalmic solution SMARTSIG:1 Drop(s) In Eye(s) Every 4-6 Hours PRN    . levothyroxine (SYNTHROID) 50 MCG tablet TAKE ONE TABLET ON AN EMPTY STOMACH WITHA GLASS OF WATER AT LEAST 30 TO 60 MINUTES BEFORE BREAKFAST 90 tablet 1  . losartan (COZAAR) 100 MG tablet TAKE 1 TABLET BY MOUTH DAILY 90 tablet 1  . mometasone (ELOCON) 0.1 % cream     . mupirocin ointment (BACTROBAN) 2 % Apply 1 application topically 3 (three) times daily. 30 g 2  . MYRBETRIQ 50 MG TB24 tablet TAKE ONE TABLET EVERY DAY 30 tablet 11  . Nintedanib (OFEV) 100 MG CAPS Take 1 capsule (100 mg total) by mouth 2 (two) times daily. 60 capsule 5  . potassium chloride SA (KLOR-CON) 20 MEQ tablet TAKE ONE TABLET EVERY DAY 90 tablet 0  . PROAIR HFA 108 (90 Base) MCG/ACT inhaler  INHALE 2 PUFFS INTO THE LUNGS EVERY SIX HOURS AS NEEDED FOR WHEEZING OR SHORTNESS OF BREATH 8.5 g 1  . Respiratory Therapy Supplies (FLUTTER) DEVI 1 Device by Does not apply route daily. 1 each 0  . rosuvastatin (CRESTOR) 10 MG tablet TAKE 1 TABLET BY MOUTH DAILY 90 tablet 0  . Tiotropium Bromide-Olodaterol (STIOLTO RESPIMAT) 2.5-2.5 MCG/ACT AERS Inhale 2 puffs into the lungs daily. 1 g 5   No current facility-administered medications for this visit.    PHYSICAL EXAMINATION:   BP 138/62   Pulse 70   Temp (!) 97.3 F (36.3 C) (Tympanic)   Resp 20   Ht 5\' 7"  (1.702 m)   Wt 224 lb (101.6 kg)   BMI 35.08 kg/m   Filed Weights   12/09/19 1350  Weight: 224 lb (101.6 kg)    Physical Exam Constitutional:      Comments: Accompanied by his wife.  HENT:     Head: Normocephalic and atraumatic.     Mouth/Throat:     Pharynx: No oropharyngeal exudate.  Eyes:     Pupils: Pupils are equal, round, and reactive to light.  Cardiovascular:     Rate and Rhythm: Normal rate and regular rhythm.  Pulmonary:     Effort: No respiratory distress.     Breath sounds: No wheezing.  Abdominal:     General: Bowel sounds are normal. There is no distension.     Palpations: Abdomen is soft. There is no mass.     Tenderness: There is no abdominal tenderness. There is no guarding or rebound.  Musculoskeletal:        General: No tenderness. Normal range of motion.     Cervical back: Normal range of motion and neck supple.  Skin:    General: Skin is warm.  Neurological:     Mental Status: He is alert and oriented to person, place, and time.  Psychiatric:        Mood and Affect: Affect normal.      LABORATORY DATA:  I have reviewed the data as listed    Component Value Date/Time   NA 137 12/09/2019 1308   NA 139 10/09/2017 1441   K 3.8 12/09/2019 1308   CL 100 12/09/2019 1308   CO2 29 12/09/2019 1308   GLUCOSE 111 (H) 12/09/2019 1308   BUN 18 12/09/2019 1308   BUN 23 10/09/2017 1441    CREATININE 0.96 12/09/2019 1308   CREATININE 0.95 12/06/2011  1554   CALCIUM 8.2 (L) 12/09/2019 1308   PROT 6.4 (L) 12/09/2019 1308   PROT 7.0 12/03/2015 0842   PROT 7.6 09/20/2011 1529   ALBUMIN 3.6 12/09/2019 1308   ALBUMIN 4.2 12/03/2015 0842   ALBUMIN 3.7 09/20/2011 1529   AST 18 12/09/2019 1308   AST 18 09/20/2011 1529   ALT 6 12/09/2019 1308   ALT 23 09/20/2011 1529   ALKPHOS 71 12/09/2019 1308   ALKPHOS 133 09/20/2011 1529   BILITOT 0.9 12/09/2019 1308   BILITOT 0.8 12/03/2015 0842   BILITOT 0.5 09/20/2011 1529   GFRNONAA >60 12/09/2019 1308   GFRNONAA >60 12/06/2011 1554   GFRAA >60 12/09/2019 1308   GFRAA >60 12/06/2011 1554    No results found for: SPEP, UPEP  Lab Results  Component Value Date   WBC 10.9 (H) 12/09/2019   NEUTROABS 8.0 (H) 12/09/2019   HGB 14.7 12/09/2019   HCT 44.0 12/09/2019   MCV 92.4 12/09/2019   PLT 137 (L) 12/09/2019      Chemistry      Component Value Date/Time   NA 137 12/09/2019 1308   NA 139 10/09/2017 1441   K 3.8 12/09/2019 1308   CL 100 12/09/2019 1308   CO2 29 12/09/2019 1308   BUN 18 12/09/2019 1308   BUN 23 10/09/2017 1441   CREATININE 0.96 12/09/2019 1308   CREATININE 0.95 12/06/2011 1554      Component Value Date/Time   CALCIUM 8.2 (L) 12/09/2019 1308   ALKPHOS 71 12/09/2019 1308   ALKPHOS 133 09/20/2011 1529   AST 18 12/09/2019 1308   AST 18 09/20/2011 1529   ALT 6 12/09/2019 1308   ALT 23 09/20/2011 1529   BILITOT 0.9 12/09/2019 1308   BILITOT 0.8 12/03/2015 0842   BILITOT 0.5 09/20/2011 1529        ASSESSMENT & PLAN:   Thrombocytopenia ([place]) # Mild thrombocytopenia platelets-137  question secondary to alcohol versus liver disease.vs ITP.  Stable.  Monitor for now.  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 STABLE.; HCT -44;  HOLD phlebotomy.   # CAD/-stenting [Dr.Arida] A. Fib- on Eliquis per cardiology/ see above.  STABLE.   #Interstitial lung disease- on Nintedanib-lobar pulmonary.  Stable  # I spoke to his wife; the above plan of care.  # DISPOSITION:  # NO Phlebotomy today # Follow up in  6 months-MD labs- cbc/cmp- Dr.B  Cc; Dr.Sonnenberg     Cammie Sickle, MD 12/09/2019 2:23 PM

## 2019-12-11 ENCOUNTER — Telehealth: Payer: Self-pay | Admitting: Pulmonary Disease

## 2019-12-11 NOTE — Telephone Encounter (Signed)
Spoke with the pt's spouse [name]  She states pt taking OFEV 100 mg bid with meals  This dose was lowered down from 150 mg due to diarrhea and anorexia  She states this has not helped- still has no appetite and diarrhea daily  He is having trouble staying hydrated and overall not not feeling well- dizzy and off balance  She has tried giving him pepto and kaopectate  Please advise thanks

## 2019-12-11 NOTE — Telephone Encounter (Signed)
See Dr. Matilde Bash note, he would like him to stop and follow up in sept with him

## 2019-12-11 NOTE — Telephone Encounter (Signed)
Please instruct him to stop the Ofev.  I will discuss with him about alternatives when he returns to visit in mid-September.

## 2019-12-11 NOTE — Telephone Encounter (Signed)
Forwarding to APP of the day  Please advise thanks

## 2019-12-11 NOTE — Telephone Encounter (Signed)
Spoke with the pt's spouse and notified of response per Dr Vaughan Browner  She verbalized understanding  Appt 01/01/20

## 2019-12-12 ENCOUNTER — Ambulatory Visit: Payer: PPO | Admitting: Cardiovascular Disease

## 2019-12-12 ENCOUNTER — Other Ambulatory Visit: Payer: Self-pay

## 2019-12-12 ENCOUNTER — Encounter: Payer: Self-pay | Admitting: Cardiovascular Disease

## 2019-12-12 VITALS — BP 130/76 | HR 83 | Ht 67.0 in | Wt 223.8 lb

## 2019-12-12 DIAGNOSIS — I2510 Atherosclerotic heart disease of native coronary artery without angina pectoris: Secondary | ICD-10-CM | POA: Diagnosis not present

## 2019-12-12 DIAGNOSIS — I5032 Chronic diastolic (congestive) heart failure: Secondary | ICD-10-CM | POA: Diagnosis not present

## 2019-12-12 DIAGNOSIS — I10 Essential (primary) hypertension: Secondary | ICD-10-CM | POA: Diagnosis not present

## 2019-12-12 DIAGNOSIS — I480 Paroxysmal atrial fibrillation: Secondary | ICD-10-CM | POA: Diagnosis not present

## 2019-12-12 MED ORDER — POTASSIUM CHLORIDE CRYS ER 10 MEQ PO TBCR
10.0000 meq | EXTENDED_RELEASE_TABLET | Freq: Every day | ORAL | 1 refills | Status: DC
Start: 2019-12-12 — End: 2021-05-25

## 2019-12-12 NOTE — Progress Notes (Signed)
Cardiology Office Note   Date:  12/12/2019   ID:  Andre [name], DOB Mar 03, 1936, MRN [id number]  PCP:  [name], MD  Cardiologist:   [name], MD   Chief Complaint  Patient presents with  . Follow-up    3 Month follow up. Medications verbally reviewed with patient.       History of Present Illness: Andre [name] is a 84 y.o. male who presents for a follow-up visit regarding coronary artery disease.   He has known history of coronary artery disease status post 1 vessel CABG with LIMA to LAD in 1998, hypertension, hyperlipidemia, chronic diastolic heart aure, paroxysmal atrial fibrillation and morbid obesity. He also has history of pulmonary embolism and Parkinson's. He was hospitalized in July of 2018 with a small non-ST elevation myocardial infarction. Echocardiogram showed normal LV systolic function with severely dilated left atrium. Cardiac catheterization showed occluded native LAD with patent LIMA, severe proximal RCA stenosis with heavy thrombus and moderate distal left main stenosis with significant ostial stenosis in the ramus branch. EF was 50-55% with mildly elevated left ventricular end-diastolic pressure. I performed successful angioplasty and drug-eluting stent placement to the right coronary artery which was complicated by embolization into a large RV branch at the lesion site. This was treated with balloon angioplasty.  A right and left cardiac catheterization was done in June of 2019 due to exertional dyspnea which showed  patent LIMA to LAD and patent RCA stent.  There was significant ostial disease in ramus branch as well as first diagonal.  Both of these were unchanged from previous cardiac catheterization.  Right heart catheterization showed only mildly elevated filling pressures, minimal pulmonary hypertension and normal cardiac output.  Based on that, I felt that his symptoms were mostly noncardiac. Shortness of breath was felt to be multifactorial due  to chronic diastolic heart failure, lung disease, physical deconditioning and possible allergies.      He is being treated by pulmonary for pulmonary fibrosis.   He has been doing well with no chest pain or palpitations.  He reports gradual improvement in shortness of breath.  He continues to lose weight with improved lifestyle.  No significant leg edema   Past Medical History:  Diagnosis Date  . Atherosclerosis of abdominal aorta ([place])   . Basal cell carcinoma 03/04/2008   Right nose supratip.   [name] CAD (coronary artery disease)   . Cervical spondylosis 10/01/2013  . Chronic diastolic CHF (congestive heart failure) ([place])    a. 07/2016 Echo: >55%; b. 10/2016 Echo: EF 55-60%, Gr1 DD, Ao sclerosis w/o stenosis, sev dil LA; c. 08/2017 Echo: EF 60-65%, no rwma, Gr2 DD, mild AS, sev dil LA/RA.  [name] Coronary artery disease    a. 1998 s/p mini-cabg @ [facility] - LIMA->LAD;  b. 07/2016 St Echo: Inadequate HR w/ HTN response;  c.  08/2016 MV: EF 67%, no ischemia; d. 10/2016 NSTEMI/Cath: RCA 95p (4.0x26 Onyx DES), LIMA->LAD nl; e. 09/2017 Cath: LM 40/30, LAD 100ost, RI 80, LCX nl, OM2/3 nl, RCA patent stent, 97m, LIMA->LAD nl-->Med Rx.  . DDD (degenerative disc disease), cervical   . DDD (degenerative disc disease), lumbar   . Depression   . Gait abnormality 07/31/2019  . GERD (gastroesophageal reflux disease)   . Hyperlipidemia   . Hypertension   . Hypothyroidism   . PAF (paroxysmal atrial fibrillation) (HCC)    a. s/p DCCV-->maintaining sinus on amiodarone;  b. CHA2DS2VASc = 5-->eliquis.  . Parkinson's disease ([place])  tremors  . Pleural effusion, right    a. 09/2017 s/p thoracentesis.  [name] PNA (pneumonia) 08/26/2017  . Pulmonary embolism ([place]) 2011  . Pulmonary fibrosis ([place])   . 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 Sleep apnea    wears CPAP  . Squamous cell carcinoma of skin 03/19/2015   Right lateral crown. KA-like pattern  . Thrombocytopenia ([place])     Past Surgical History:  Procedure Laterality  Date  . BACK SURGERY  1960  . CARDIAC CATHETERIZATION    . CHOLECYSTECTOMY  2010  . COLONOSCOPY WITH PROPOFOL N/A 06/07/2018   Procedure: COLONOSCOPY WITH PROPOFOL;  Surgeon: Lollie Sails, MD;  Location: [facility] ENDOSCOPY;  Service: Endoscopy;  Laterality: N/A;  . CORONARY ARTERY BYPASS GRAFT  01/07/1997  . CORONARY STENT INTERVENTION N/A 10/31/2016   Procedure: Coronary Stent Intervention;  Surgeon: Wellington Hampshire, MD;  Location: [place] CV LAB;  Service: Cardiovascular;  Laterality: N/A;  . ELECTROPHYSIOLOGIC STUDY N/A 07/14/2015   Procedure: CARDIOVERSION;  Surgeon: Yolonda Kida, MD;  Location: [facility] ORS;  Service: Cardiovascular;  Laterality: N/A;  . ELECTROPHYSIOLOGIC STUDY N/A 10/12/2015   Procedure: CARDIOVERSION;  Surgeon: Minna Merritts, MD;  Location: [facility] ORS;  Service: Cardiovascular;  Laterality: N/A;  . LEFT HEART CATH AND CORONARY ANGIOGRAPHY N/A 10/31/2016   Procedure: Left Heart Cath and Coronary Angiography;  Surgeon: Wellington Hampshire, MD;  Location: [place] CV LAB;  Service: Cardiovascular;  Laterality: N/A;  . OTHER SURGICAL HISTORY  1998   Bypass  . RIGHT/LEFT HEART CATH AND CORONARY ANGIOGRAPHY N/A 09/18/2017   Procedure: RIGHT/LEFT HEART CATH AND CORONARY ANGIOGRAPHY;  Surgeon: Wellington Hampshire, MD;  Location: [place] CV LAB;  Service: Cardiovascular;  Laterality: N/A;     Current Outpatient Medications  Medication Sig Dispense Refill  . albuterol (PROVENTIL) (2.5 MG/3ML) 0.083% nebulizer solution USE ONE AMPULE(3ML) VIA NEBULIZER EVERY 4 HOURS AS NEEDED FOR WHEEZING OR SHORTNESS OF BREATH 375 mL 11  . buPROPion (WELLBUTRIN XL) 300 MG 24 hr tablet Take 1 tablet (300 mg total) by mouth daily. 90 tablet 1  . carbidopa-levodopa (SINEMET IR) 25-250 MG tablet TAKE TWO TABLETS 3 TIMES DAILY 540 tablet 3  . carvedilol (COREG) 3.125 MG tablet TAKE TWO TABLETS TWICE A DAY WITH MEALS 120 tablet 2  . ELIQUIS 5 MG TABS tablet TAKE ONE TABLET BY MOUTH  TWICE DAILY 60 tablet 5  . entacapone (COMTAN) 200 MG tablet Take 1 tablet (200 mg total) by mouth 3 (three) times daily. 270 tablet 3  . esomeprazole (NEXIUM) 40 MG capsule TAKE 1 CAPSULE BY MOUTH ONCE DAILY 30 capsule 2  . fluticasone (FLONASE) 50 MCG/ACT nasal spray USE 2 PUFFS IN EACH NOSTRIL DAILY 16 g 3  . furosemide (LASIX) 20 MG tablet Take 2 tablets (40 mg total) by mouth daily.  0  . HYDROcodone-acetaminophen (NORCO/VICODIN) 5-325 MG tablet Take 1 tablet by mouth every 6 (six) hours as needed for moderate pain or severe pain. 12 tablet 0  . isosorbide mononitrate (IMDUR) 30 MG 24 hr tablet TAKE ONE TABLET BY MOUTH EVERY DAY 30 tablet 0  . ketoconazole (NIZORAL) 2 % cream     . ketorolac (ACULAR) 0.5 % ophthalmic solution SMARTSIG:1 Drop(s) In Eye(s) Every 4-6 Hours PRN    . levothyroxine (SYNTHROID) 50 MCG tablet TAKE ONE TABLET ON AN EMPTY STOMACH WITHA GLASS OF WATER AT LEAST 30 TO 60 MINUTES BEFORE BREAKFAST 90 tablet 1  . losartan (COZAAR) 100 MG tablet TAKE 1 TABLET  BY MOUTH DAILY 90 tablet 1  . mometasone (ELOCON) 0.1 % cream     . mupirocin ointment (BACTROBAN) 2 % Apply 1 application topically 3 (three) times daily. 30 g 2  . MYRBETRIQ 50 MG TB24 tablet TAKE ONE TABLET EVERY DAY 30 tablet 11  . potassium chloride SA (KLOR-CON) 20 MEQ tablet TAKE ONE TABLET EVERY DAY 90 tablet 0  . PROAIR HFA 108 (90 Base) MCG/ACT inhaler INHALE 2 PUFFS INTO THE LUNGS EVERY SIX HOURS AS NEEDED FOR WHEEZING OR SHORTNESS OF BREATH 8.5 g 1  . Respiratory Therapy Supplies (FLUTTER) DEVI 1 Device by Does not apply route daily. 1 each 0  . rosuvastatin (CRESTOR) 10 MG tablet TAKE 1 TABLET BY MOUTH DAILY 90 tablet 0  . Tiotropium Bromide-Olodaterol (STIOLTO RESPIMAT) 2.5-2.5 MCG/ACT AERS Inhale 2 puffs into the lungs daily. 1 g 5  . Nintedanib (OFEV) 100 MG CAPS Take 1 capsule (100 mg total) by mouth 2 (two) times daily. (Patient not taking: Reported on 12/12/2019) 60 capsule 5   No current  facility-administered medications for this visit.    Allergies:   Pravastatin and Prednisone    Social History:  The patient  reports that he quit smoking about 47 years ago. His smoking use included cigarettes, pipe, and cigars. He has a 10.00 pack-year smoking history. He quit smokeless tobacco use about 47 years ago.  His smokeless tobacco use included chew. He reports current alcohol use of about 4.0 standard drinks of alcohol per week. He reports that he does not use drugs.   Family History:  The patient's family history includes Alcohol abuse in his father.    ROS:  Please see the history of present illness.   Otherwise, review of systems are positive for none.   All other systems are reviewed and negative.    PHYSICAL EXAM: VS:  BP 130/76 (BP Location: Left Arm, Patient Position: Sitting, Cuff Size: Normal)   Pulse 83   Ht 5\' 7"  (1.702 m)   Wt 223 lb 12.8 oz (101.5 kg)   SpO2 97%   BMI 35.05 kg/m  , BMI Body mass index is 35.05 kg/m. GEN: Well nourished, well developed, in no acute distress  HEENT: normal  Neck: no JVD, carotid bruits, or masses Cardiac: Irregularly irregular; no murmurs, rubs, or gallops, trace bilateral leg edema Respiratory:  clear to auscultation bilaterally with mildly diminished breath sounds, normal work of breathing GI: soft, nontender, nondistended, + BS MS: no deformity or atrophy  Skin: warm and dry, no rash Neuro:  Strength and sensation are intact Psych: euthymic mood, full affect  EKG:  EKG is ordered today. The ekg ordered today demonstrates coarse atrial fibrillation with ventricular rate of 83 bpm.  Nonspecific inferior T wave changes.   Recent Labs: 03/25/2019: NT-Pro BNP 526; TSH 3.46 08/21/2019: Magnesium 1.8 12/09/2019: ALT 6; BUN 18; Creatinine, Ser 0.96; Hemoglobin 14.7; Platelets 137; Potassium 3.8; Sodium 137    Lipid Panel    Component Value Date/Time   CHOL 146 08/27/2017 0419   TRIG 134 08/27/2017 0419   HDL 43  08/27/2017 0419   CHOLHDL 3.4 08/27/2017 0419   VLDL 27 08/27/2017 0419   LDLCALC 76 08/27/2017 0419      Wt Readings from Last 3 Encounters:  12/12/19 223 lb 12.8 oz (101.5 kg)  12/09/19 224 lb (101.6 kg)  11/27/19 224 lb 9.6 oz (101.9 kg)       No flowsheet data found.    ASSESSMENT AND PLAN:  1.Coronary artery disease involving native coronary arteries without angina: He is doing well overall.  I recommend continuing medical therapy.  He wants to cut down his medications and reports no change in symptoms on Imdur.  Thus, I discontinued the medication.  2.  Persistent atrial fibrillation: He might be transitioning into permanent A. fib but has minimal symptoms.  This is currently being treated with rate control and anticoagulation with Eliquis 5 mg twice daily.    3. Essential hypertension: Blood pressure is controlled on current medications.  4. Hyperlipidemia: He had myalgia with atorvastatin but he is tolerating rosuvastatin.  Most recent LDL was 76.  5.  Chronic diastolic heart failure: Given continued weight loss and no significant volume overload, I decrease furosemide to 40 mg once daily.  He was taking this twice daily.   Disposition:   FU with me in 6 months  Signed,  [name], MD  12/12/2019 3:24 PM    [facility]

## 2019-12-12 NOTE — Patient Instructions (Signed)
Medication Instructions:  Your physician has recommended you make the following change in your medication:   1) STOP Imdur  2) CHANGE Lasix to 40 mg daily  3) DECREASE Potassium 10 mEq daily  *If you need a refill on your cardiac medications before your next appointment, please call your pharmacy*   Lab Work: None ordered If you have labs (blood work) drawn today and your tests are completely normal, you will receive your results only by: [name] MyChart Message (if you have MyChart) OR . A paper copy in the mail If you have any lab test that is abnormal or we need to change your treatment, we will call you to review the results.   Testing/Procedures: None ordered   Follow-Up: At [facility], you and your health needs are our priority.  As part of our continuing mission to provide you with exceptional heart care, we have created designated Provider Care Teams.  These Care Teams include your primary Cardiologist (physician) and Advanced Practice Providers (APPs -  Physician Assistants and Nurse Practitioners) who all work together to provide you with the care you need, when you need it.  We recommend signing up for the patient portal called "MyChart".  Sign up information is provided on this After Visit Summary.  MyChart is used to connect with patients for Virtual Visits (Telemedicine).  Patients are able to view lab/test results, encounter notes, upcoming appointments, etc.  Non-urgent messages can be sent to your provider as well.   To learn more about what you can do with MyChart, go to [email or web address].    Your next appointment:   6 month(s)  The format for your next appointment:   In Person  Provider:    You may see [name], MD or one of the following Advanced Practice Providers on your designated Care Team:    Murray Hodgkins, NP  Christell Faith, PA-C  Marrianne Mood, PA-C    Other Instructions N/A

## 2019-12-18 ENCOUNTER — Ambulatory Visit: Payer: PPO | Admitting: Pulmonary Disease

## 2019-12-20 ENCOUNTER — Other Ambulatory Visit: Payer: Self-pay | Admitting: Cardiovascular Disease

## 2019-12-20 ENCOUNTER — Other Ambulatory Visit: Payer: Self-pay | Admitting: Neurology

## 2019-12-24 ENCOUNTER — Other Ambulatory Visit: Payer: Self-pay

## 2019-12-24 ENCOUNTER — Encounter: Payer: Self-pay | Admitting: Nurse Practitioner

## 2019-12-24 ENCOUNTER — Ambulatory Visit (INDEPENDENT_AMBULATORY_CARE_PROVIDER_SITE_OTHER): Payer: PPO

## 2019-12-24 ENCOUNTER — Ambulatory Visit (INDEPENDENT_AMBULATORY_CARE_PROVIDER_SITE_OTHER): Payer: PPO | Admitting: Nurse Practitioner

## 2019-12-24 VITALS — BP 150/78 | HR 83 | Temp 97.6°F | Ht 67.0 in | Wt 222.0 lb

## 2019-12-24 DIAGNOSIS — Z23 Encounter for immunization: Secondary | ICD-10-CM | POA: Diagnosis not present

## 2019-12-24 DIAGNOSIS — R0781 Pleurodynia: Secondary | ICD-10-CM

## 2019-12-24 DIAGNOSIS — J8410 Pulmonary fibrosis, unspecified: Secondary | ICD-10-CM

## 2019-12-24 DIAGNOSIS — I517 Cardiomegaly: Secondary | ICD-10-CM | POA: Diagnosis not present

## 2019-12-24 NOTE — Progress Notes (Signed)
Established Patient Office Visit  Subjective:  Patient ID: Andre [name], male    DOB: December 11, 1935  Age: 84 y.o. MRN: [id number]  CC:  Chief Complaint  Patient presents with  . Acute Visit    right side pain    HPI Andre [name] presents for pain behind right ribs getting better over the last 2 days. A rolling chair slipped out and he sat down real hard a week before 11/23/2019. He was seen in Acute care and hurt his coccyx by Xray. No rib pain at the time.  Movement hurts it- and is getting better. " I almost canceled today. "  He took Tylenol and it helped a while . He has chronic  SOB and has pulmonary effusion and scars on lungs- nothing new going on with breathing. He had a cholecystectomy. No CP, pressure, or change in baseline SOB or DOE, calf pain or long car trips.   HX pulm fibrosis- no SOB out of the ordinary. Lungs CTA.   Past Medical History:  Diagnosis Date  . Atherosclerosis of abdominal aorta ([place])   . Basal cell carcinoma 03/04/2008   Right nose supratip.   [name] CAD (coronary artery disease)   . Cervical spondylosis 10/01/2013  . Chronic diastolic CHF (congestive heart failure) ([place])    a. 07/2016 Echo: >55%; b. 10/2016 Echo: EF 55-60%, Gr1 DD, Ao sclerosis w/o stenosis, sev dil LA; c. 08/2017 Echo: EF 60-65%, no rwma, Gr2 DD, mild AS, sev dil LA/RA.  [name] Coronary artery disease    a. 1998 s/p mini-cabg @ [facility] - LIMA->LAD;  b. 07/2016 St Echo: Inadequate HR w/ HTN response;  c.  08/2016 MV: EF 67%, no ischemia; d. 10/2016 NSTEMI/Cath: RCA 95p (4.0x26 Onyx DES), LIMA->LAD nl; e. 09/2017 Cath: LM 40/30, LAD 100ost, RI 80, LCX nl, OM2/3 nl, RCA patent stent, 45m, LIMA->LAD nl-->Med Rx.  . DDD (degenerative disc disease), cervical   . DDD (degenerative disc disease), lumbar   . Depression   . Gait abnormality 07/31/2019  . GERD (gastroesophageal reflux disease)   . Hyperlipidemia   . Hypertension   . Hypothyroidism   . PAF (paroxysmal atrial fibrillation) ([facility])    a. s/p  DCCV-->maintaining sinus on amiodarone;  b. CHA2DS2VASc = 5-->eliquis.  . Parkinson's disease ([place])    tremors  . Pleural effusion, right    a. 09/2017 s/p thoracentesis.  [name] PNA (pneumonia) 08/26/2017  . Pulmonary embolism ([place]) 2011  . Pulmonary fibrosis ([place])   . 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 Sleep apnea    wears CPAP  . Squamous cell carcinoma of skin 03/19/2015   Right lateral crown. KA-like pattern  . Thrombocytopenia ([place])     Past Surgical History:  Procedure Laterality Date  . BACK SURGERY  1960  . CARDIAC CATHETERIZATION    . CHOLECYSTECTOMY  2010  . COLONOSCOPY WITH PROPOFOL N/A 06/07/2018   Procedure: COLONOSCOPY WITH PROPOFOL;  Surgeon: Lollie Sails, MD;  Location[facility] ENDOSCOPY;  Service: Endoscopy;  Laterality: N/A;  . CORONARY ARTERY BYPASS GRAFT  01/07/1997  . CORONARY STENT INTERVENTION N/A 10/31/2016   Procedure: Coronary Stent Intervention;  Surgeon: Wellington Hampshire, MD;  Location: [place] CV LAB;  Service: Cardiovascular;  Laterality: N/A;  . ELECTROPHYSIOLOGIC STUDY N/A 07/14/2015   Procedure: CARDIOVERSION;  Surgeon: Yolonda Kida, MD;  Location: [facility] ORS;  Service: Cardiovascular;  Laterality: N/A;  . ELECTROPHYSIOLOGIC STUDY N/A 10/12/2015   Procedure: CARDIOVERSION;  Surgeon: Minna Merritts, MD;  Location: [facility]  ORS;  Service: Cardiovascular;  Laterality: N/A;  . LEFT HEART CATH AND CORONARY ANGIOGRAPHY N/A 10/31/2016   Procedure: Left Heart Cath and Coronary Angiography;  Surgeon: Wellington Hampshire, MD;  Location: [place] CV LAB;  Service: Cardiovascular;  Laterality: N/A;  . OTHER SURGICAL HISTORY  1998   Bypass  . RIGHT/LEFT HEART CATH AND CORONARY ANGIOGRAPHY N/A 09/18/2017   Procedure: RIGHT/LEFT HEART CATH AND CORONARY ANGIOGRAPHY;  Surgeon: Wellington Hampshire, MD;  Location: [place] CV LAB;  Service: Cardiovascular;  Laterality: N/A;    Family History  Problem Relation Age of Onset  . Alcohol abuse Father      Social History   Socioeconomic History  . Marital status: Married    Spouse name: [name]  . Number of children: 1  . Years of education: 52  . Highest education level: Not on file  Occupational History  . Occupation: Retired    Comment: Designer, television/film set  Tobacco Use  . Smoking status: Former Smoker    Packs/day: 1.00    Years: 10.00    Pack years: 10.00    Types: Cigarettes, Pipe, Cigars    Quit date: 04/18/1972    Years since quitting: 47.7  . Smokeless tobacco: Former Systems developer    Types: Chew    Quit date: 04/18/1972  Vaping Use  . Vaping Use: Never used  Substance and Sexual Activity  . Alcohol use: Yes    Alcohol/week: 4.0 standard drinks    Types: 2 Cans of beer, 2 Shots of liquor per week    Comment: per 2 weeks   . Drug use: No  . Sexual activity: Yes  Other Topics Concern  . Not on file  Social History Narrative   Lives w/ wife   Caffeine use: none   Right-handed   Social Determinants of Health   Financial Resource Strain:   . Difficulty of Paying Living Expenses: Not on file  Food Insecurity:   . Worried About Charity fundraiser in the Last Year: Not on file  . Ran Out of Food in the Last Year: Not on file  Transportation Needs:   . Lack of Transportation (Medical): Not on file  . Lack of Transportation (Non-Medical): Not on file  Physical Activity:   . Days of Exercise per Week: Not on file  . Minutes of Exercise per Session: Not on file  Stress:   . Feeling of Stress : Not on file  Social Connections:   . Frequency of Communication with Friends and Family: Not on file  . Frequency of Social Gatherings with Friends and Family: Not on file  . Attends Religious Services: Not on file  . Active Member of Clubs or Organizations: Not on file  . Attends Archivist Meetings: Not on file  . Marital Status: Not on file  Intimate Partner Violence:   . Fear of Current or Ex-Partner: Not on file  . Emotionally Abused: Not on file  . Physically  Abused: Not on file  . Sexually Abused: Not on file    Outpatient Medications Prior to Visit  Medication Sig Dispense Refill  . albuterol (PROVENTIL) (2.5 MG/3ML) 0.083% nebulizer solution USE ONE AMPULE(3ML) VIA NEBULIZER EVERY 4 HOURS AS NEEDED FOR WHEEZING OR SHORTNESS OF BREATH 375 mL 11  . buPROPion (WELLBUTRIN XL) 300 MG 24 hr tablet Take 1 tablet (300 mg total) by mouth daily. 90 tablet 1  . carbidopa-levodopa (SINEMET IR) 25-250 MG tablet TAKE TWO TABLETS 3 TIMES DAILY 540  tablet 3  . carvedilol (COREG) 3.125 MG tablet TAKE TWO TABLETS TWICE A DAY WITH MEALS 120 tablet 3  . ELIQUIS 5 MG TABS tablet TAKE ONE TABLET BY MOUTH TWICE DAILY 60 tablet 5  . entacapone (COMTAN) 200 MG tablet Take 1 tablet (200 mg total) by mouth 3 (three) times daily. 270 tablet 3  . esomeprazole (NEXIUM) 40 MG capsule TAKE 1 CAPSULE BY MOUTH ONCE DAILY 30 capsule 2  . fluticasone (FLONASE) 50 MCG/ACT nasal spray USE 2 PUFFS IN EACH NOSTRIL DAILY 16 g 3  . furosemide (LASIX) 20 MG tablet Take 20 mg by mouth daily.    [name] HYDROcodone-acetaminophen (NORCO/VICODIN) 5-325 MG tablet Take 1 tablet by mouth every 6 (six) hours as needed for moderate pain or severe pain. 12 tablet 0  . ketoconazole (NIZORAL) 2 % cream     . ketorolac (ACULAR) 0.5 % ophthalmic solution SMARTSIG:1 Drop(s) In Eye(s) Every 4-6 Hours PRN    . levothyroxine (SYNTHROID) 50 MCG tablet TAKE ONE TABLET ON AN EMPTY STOMACH WITHA GLASS OF WATER AT LEAST 30 TO 60 MINUTES BEFORE BREAKFAST 90 tablet 1  . losartan (COZAAR) 100 MG tablet TAKE 1 TABLET BY MOUTH DAILY 90 tablet 1  . mometasone (ELOCON) 0.1 % cream     . mupirocin ointment (BACTROBAN) 2 % Apply 1 application topically 3 (three) times daily. 30 g 2  . Nintedanib (OFEV) 100 MG CAPS Take 1 capsule (100 mg total) by mouth 2 (two) times daily. 60 capsule 5  . potassium chloride SA (KLOR-CON) 10 MEQ tablet Take 1 tablet (10 mEq total) by mouth daily. 90 tablet 1  . PROAIR HFA 108 (90 Base)  MCG/ACT inhaler INHALE 2 PUFFS INTO THE LUNGS EVERY SIX HOURS AS NEEDED FOR WHEEZING OR SHORTNESS OF BREATH 8.5 g 1  . Respiratory Therapy Supplies (FLUTTER) DEVI 1 Device by Does not apply route daily. 1 each 0  . rosuvastatin (CRESTOR) 10 MG tablet TAKE 1 TABLET BY MOUTH DAILY 90 tablet 0  . Tiotropium Bromide-Olodaterol (STIOLTO RESPIMAT) 2.5-2.5 MCG/ACT AERS Inhale 2 puffs into the lungs daily. 1 g 5  . furosemide (LASIX) 20 MG tablet TAKE TWO TABLETS TWICE A DAY (Patient taking differently: Take 20 mg by mouth daily. ) 360 tablet 2  . MYRBETRIQ 50 MG TB24 tablet TAKE ONE TABLET EVERY DAY (Patient not taking: Reported on 12/24/2019) 30 tablet 11   No facility-administered medications prior to visit.    Allergies  Allergen Reactions  . Pravastatin Other (See Comments)  . Prednisone Other (See Comments)    Pt states that med makes him hyper Pt states that med makes him hyper    Review of Systems  Pertinent positives noted in HPI and otherwise negative.     Objective:    Physical Exam Vitals reviewed.  Constitutional:      Appearance: He is obese.  HENT:     Head: Normocephalic and atraumatic.  Cardiovascular:     Rate and Rhythm: Normal rate and regular rhythm.     Pulses: Normal pulses.     Heart sounds: Normal heart sounds.  Pulmonary:     Effort: Pulmonary effort is normal.     Breath sounds: Normal breath sounds. No stridor. No wheezing or rales.  Chest:     Chest wall: No tenderness.  Abdominal:     Palpations: Abdomen is soft.     Tenderness: There is no abdominal tenderness.  Musculoskeletal:        General: Normal range  of motion.     Cervical back: Normal range of motion and neck supple.     Comments: Hurts in right anterior chest behind rib 12 and non tender RUQ. No bruising or rash. No tenderness could be isolated.  Twisting and bending reproduces the pain- mild. "It's getting better."  Skin:    General: Skin is warm and dry.  Neurological:     General: No  focal deficit present.     Mental Status: He is alert and oriented to person, place, and time.  Psychiatric:        Mood and Affect: Mood normal.        Behavior: Behavior normal.     BP (!) 150/78 (BP Location: Left Arm, Patient Position: Sitting, Cuff Size: Normal)   Pulse 83   Temp 97.6 F (36.4 C) (Oral)   Ht 5\' 7"  (1.702 m)   Wt 222 lb (100.7 kg)   SpO2 96%   BMI 34.77 kg/m  Wt Readings from Last 3 Encounters:  12/24/19 222 lb (100.7 kg)  12/12/19 223 lb 12.8 oz (101.5 kg)  12/09/19 224 lb (101.6 kg)   Pulse Readings from Last 3 Encounters:  12/24/19 83  12/12/19 83  12/09/19 70    BP Readings from Last 3 Encounters:  12/24/19 (!) 150/78  12/12/19 130/76  12/09/19 138/62    Lab Results  Component Value Date   CHOL 146 08/27/2017   HDL 43 08/27/2017   LDLCALC 76 08/27/2017   TRIG 134 08/27/2017   CHOLHDL 3.4 08/27/2017      Health Maintenance Due  Topic Date Due  . FOOT EXAM  Never done  . OPHTHALMOLOGY EXAM  Never done  . PNA vac Low Risk Adult (2 of 2 - PPSV23) 05/10/2017  . INFLUENZA VACCINE  11/17/2019    There are no preventive care reminders to display for this patient.  Lab Results  Component Value Date   TSH 3.46 03/25/2019   Lab Results  Component Value Date   WBC 10.9 (H) 12/09/2019   HGB 14.7 12/09/2019   HCT 44.0 12/09/2019   MCV 92.4 12/09/2019   PLT 137 (L) 12/09/2019   Lab Results  Component Value Date   NA 137 12/09/2019   K 3.8 12/09/2019   CO2 29 12/09/2019   GLUCOSE 111 (H) 12/09/2019   BUN 18 12/09/2019   CREATININE 0.96 12/09/2019   BILITOT 0.9 12/09/2019   ALKPHOS 71 12/09/2019   AST 18 12/09/2019   ALT 6 12/09/2019   PROT 6.4 (L) 12/09/2019   ALBUMIN 3.6 12/09/2019   CALCIUM 8.2 (L) 12/09/2019   ANIONGAP 8 12/09/2019   GFR 69.15 10/16/2019   Lab Results  Component Value Date   CHOL 146 08/27/2017   Lab Results  Component Value Date   HDL 43 08/27/2017   Lab Results  Component Value Date   LDLCALC 76  08/27/2017   Lab Results  Component Value Date   TRIG 134 08/27/2017   Lab Results  Component Value Date   CHOLHDL 3.4 08/27/2017   Lab Results  Component Value Date   HGBA1C 6.0 08/21/2019  CLINICAL DATA:  Right-sided chest pain  EXAM: CHEST - 2 VIEW  COMPARISON:  10/23/2019 CT.  Chest radiograph 03/25/2019  FINDINGS: Mild hyperinflation. Midline trachea. Moderate cardiomegaly. Atherosclerosis in the transverse aorta. Bilateral pleural calcifications, consistent with asbestos pleural disease. Trace right pleural fluid or thickening, similar. No pneumothorax. Diffuse interstitial thickening. Apparent nodularity in the left upper lobe likely relates to  pleural thickening when correlated with the prior CT. No lobar consolidation.  IMPRESSION: No acute cardiopulmonary disease.  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  Cardiomegaly.  Aortic Atherosclerosis (ICD10-I70.0).   Electronically Signed   By: Abigail Miyamoto M.D.   On: 12/25/2019 08:42    Assessment & Plan:   Problem List Items Addressed This Visit      Respiratory   Pulmonary fibrosis ([place])   Relevant Orders   DG Chest 2 View     Other   Rib pain on right side - Primary   Relevant Orders   DG Chest 2 View     Patient was advised: Right rib area pain- apply heat , Tylenol as needed and avoid positions that hurt it.   Getting better- will check CXR and call with report.   If you get short of breath-out of the ordinary or chest pain- seek emergency care.  See your lung doctor as planned next week.   No orders of the defined types were placed in this encounter.  Addendum: Please call him with chest x-ray showing his chronic lung findings are  essentially unchanged from his CT on 10/24/2019. He only had trace right pleural fluid. There was no findings to explain his rib pain that is  worse with movement. His ribs were not discussed in the chest x-ray report. If the patient is still having right lower rib discomfort, let me know and I will order dedicated RT rib films. He was reporting a daily improvement yesterday and was not tender on exam.   Follow-up: No follow-ups on file. This visit occurred during the SARS-CoV-2 public health emergency.  Safety protocols were in place, including screening questions prior to the visit, additional usage of staff PPE, and extensive cleaning of exam room while observing appropriate contact time as indicated for disinfecting solutions.   Denice Paradise, NP

## 2019-12-24 NOTE — Patient Instructions (Addendum)
Right rib area pain- apply heat , Tylenol as needed and avoid positions that hurt it.   Getting better- will check CXR and call with report.   If you get short of breath-out of the ordinary or chest pain- seek emergency care.  See your lung doctor as planned next week.        Nonspecific Chest Pain, Adult Chest pain can be caused by many different conditions. It can be caused by a condition that is life-threatening and requires treatment right away. It can also be caused by something that is not life-threatening. If you have chest pain, it can be hard to know the difference, so it is important to get help right away to make sure that you do not have a serious condition. Some life-threatening causes of chest pain include:  Heart attack.  A tear in the body's main blood vessel (aortic dissection).  Inflammation around your heart (pericarditis).  A problem in the lungs, such as a blood clot (pulmonary embolism) or a collapsed lung (pneumothorax). Some non life-threatening causes of chest pain include:  Heartburn.  Anxiety or stress.  Damage to the bones, muscles, and cartilage that make up your chest wall.  Pneumonia or bronchitis.  Shingles infection (varicella-zoster virus). Chest pain can feel like:  Pain or discomfort on the surface of your chest or deep in your chest.  Crushing, pressure, aching, or squeezing pain.  Burning or tingling.  Dull or sharp pain that is worse when you move, cough, or take a deep breath.  Pain or discomfort that is also felt in your back, neck, jaw, shoulder, or arm, or pain that spreads to any of these areas. Your chest pain may come and go. It may also be constant. Your health care provider will do lab tests and other studies to find the cause of your pain. Treatment will depend on the cause of your chest pain. Follow these instructions at home: Medicines  Take over-the-counter and prescription medicines only as told by your health care  provider.  If you were prescribed an antibiotic, take it as told by your health care provider. Do not stop taking the antibiotic even if you start to feel better. Lifestyle   Rest as directed by your health care provider.  Do not use any products that contain nicotine or tobacco, such as cigarettes and e-cigarettes. If you need help quitting, ask your health care provider.  Do not drink alcohol.  Make healthy lifestyle choices as recommended. These may include: ? Getting regular exercise. Ask your health care provider to suggest some activities that are safe for you. ? Eating a heart-healthy diet. This includes plenty of fresh fruits and vegetables, whole grains, low-fat (lean) protein, and low-fat dairy products. A dietitian can help you find healthy eating options. ? Maintaining a healthy weight. ? Managing any other health conditions you have, such as high blood pressure (hypertension) or diabetes. ? Reducing stress, such as with yoga or relaxation techniques. General instructions  Pay attention to any changes in your symptoms. Tell your health care provider about them or any new symptoms.  Avoid any activities that cause chest pain.  Keep all follow-up visits as told by your health care provider. This is important. This includes visits for any further testing if your chest pain does not go away. Contact a health care provider if:  Your chest pain does not go away.  You feel depressed.  You have a fever. Get help right away if:  Your chest  pain gets worse.  You have a cough that gets worse, or you cough up blood.  You have severe pain in your abdomen.  You faint.  You have sudden, unexplained chest discomfort.  You have sudden, unexplained discomfort in your arms, back, neck, or jaw.  You have shortness of breath at any time.  You suddenly start to sweat, or your skin gets clammy.  You feel nausea or you vomit.  You suddenly feel lightheaded or dizzy.  You  have severe weakness, or unexplained weakness or fatigue.  Your heart begins to beat quickly, or it feels like it is skipping beats. These symptoms may represent a serious problem that is an emergency. Do not wait to see if the symptoms will go away. Get medical help right away. Call your local emergency services (911 in the [place] Do not drive yourself to the hospital. Summary  Chest pain can be caused by a condition that is serious and requires urgent treatment. It may also be caused by something that is not life-threatening.  If you have chest pain, it is very important to see your health care provider. Your health care provider may do lab tests and other studies to find the cause of your pain.  Follow your health care provider's instructions on taking medicines, making lifestyle changes, and getting emergency treatment if symptoms become worse.  Keep all follow-up visits as told by your health care provider. This includes visits for any further testing if your chest pain does not go away. This information is not intended to replace advice given to you by your health care provider. Make sure you discuss any questions you have with your health care provider. Document Revised: 10/05/2017 Document Reviewed: 10/05/2017 Elsevier Patient Education  2020 [employer].

## 2019-12-25 ENCOUNTER — Encounter: Payer: Self-pay | Admitting: Nurse Practitioner

## 2019-12-30 ENCOUNTER — Other Ambulatory Visit: Payer: Self-pay

## 2019-12-30 ENCOUNTER — Ambulatory Visit: Payer: PPO | Admitting: Dermatology

## 2019-12-30 DIAGNOSIS — L821 Other seborrheic keratosis: Secondary | ICD-10-CM | POA: Diagnosis not present

## 2019-12-30 DIAGNOSIS — L570 Actinic keratosis: Secondary | ICD-10-CM

## 2019-12-30 DIAGNOSIS — L578 Other skin changes due to chronic exposure to nonionizing radiation: Secondary | ICD-10-CM | POA: Diagnosis not present

## 2019-12-30 DIAGNOSIS — L820 Inflamed seborrheic keratosis: Secondary | ICD-10-CM

## 2019-12-30 NOTE — Progress Notes (Signed)
   Follow-Up Visit   Subjective  Andre [name] is a 84 y.o. male who presents for the following: Actinic Keratosis (recheck previously treated areas - face and scalp; patient misplaced the paper work for [employer] and contacted our office but didn't hear back).  The following portions of the chart were reviewed this encounter and updated as appropriate:  Tobacco  Allergies  Meds  Problems  Med Hx  Surg Hx  Fam Hx     Review of Systems:  No other skin or systemic complaints except as noted in HPI or Assessment and Plan.  Objective  Well appearing patient in no apparent distress; mood and affect are within normal limits.  A focused examination was performed including the face and scalp. Relevant physical exam findings are noted in the Assessment and Plan.  Objective  Face (20): Erythematous thin papules/macules with gritty scale.   Objective  face and scalp (16): Erythematous keratotic or waxy stuck-on papule or plaque.   Assessment & Plan  AK (actinic keratosis) (20) Face  Start [employer] mix (5-FU/Calcipotriene) BID x 1 week to the face and scalp.   Destruction of lesion - Face Complexity: simple   Destruction method: cryotherapy   Informed consent: discussed and consent obtained   Timeout:  patient name, date of birth, surgical site, and procedure verified Lesion destroyed using liquid nitrogen: Yes   Region frozen until ice ball extended beyond lesion: Yes   Outcome: patient tolerated procedure well with no complications   Post-procedure details: wound care instructions given    Inflamed seborrheic keratosis (16) face and scalp  Destruction of lesion - face and scalp Complexity: simple   Destruction method: cryotherapy   Informed consent: discussed and consent obtained   Timeout:  patient name, date of birth, surgical site, and procedure verified Lesion destroyed using liquid nitrogen: Yes   Region frozen until ice ball extended beyond lesion: Yes    Outcome: patient tolerated procedure well with no complications   Post-procedure details: wound care instructions given     Actinic Damage - diffuse scaly erythematous macules with underlying dyspigmentation - Recommend daily broad spectrum sunscreen SPF 30+ to sun-exposed areas, reapply every 2 hours as needed.  - Call for new or changing lesions.  Seborrheic Keratoses - Stuck-on, waxy, tan-brown papules and plaques  - Discussed benign etiology and prognosis. - Observe - Call for any changes  Return in about 4 months (around 04/30/2020).  [name], CMA, am acting as scribe for Sarina Ser, MD .  Documentation: I have reviewed the above documentation for accuracy and completeness, and I agree with the above.  Sarina Ser, MD

## 2019-12-30 NOTE — Patient Instructions (Signed)
Instructions for [facility] Medications  One or more of your medications was sent to the [facility] mail order compounding pharmacy. You will receive an email from them and can purchase the medicine through that link. It will then be mailed to your home at the address you confirmed. If for any reason you do not receive an email from them, please check your spam folder. If you still do not find the email, please let us know. [facility] phone number is [phone].   

## 2019-12-31 ENCOUNTER — Encounter: Payer: Self-pay | Admitting: Dermatology

## 2019-12-31 ENCOUNTER — Telehealth: Payer: Self-pay | Admitting: Family Medicine

## 2019-12-31 NOTE — Telephone Encounter (Signed)
Patient scheduled a appointment with me for his depression for 01/20/2020 he also is scheduled for 02/04/2020 if you want me to cancel let me know I did not know if you would address anything else at this visit.  Dorethy Tomey,cma

## 2019-12-31 NOTE — Telephone Encounter (Signed)
He needs a follow-up visit to discuss adding additional medication.  Thanks.

## 2019-12-31 NOTE — Telephone Encounter (Signed)
Pt's wife called and states that buPROPion (WELLBUTRIN XL) 300 MG 24 hr tablet does not seem to be helping with pt's depression. Please advise on cell phone [phone]

## 2019-12-31 NOTE — Telephone Encounter (Signed)
I called and spoke with the patient and he stated that the Wellbutrin is not working he stated he " feels so down", he is depressed and wants advise.  Lela Murfin,cma

## 2020-01-01 ENCOUNTER — Encounter: Payer: Self-pay | Admitting: Pulmonary Disease

## 2020-01-01 ENCOUNTER — Ambulatory Visit: Payer: PPO

## 2020-01-01 ENCOUNTER — Other Ambulatory Visit: Payer: Self-pay

## 2020-01-01 ENCOUNTER — Ambulatory Visit: Payer: PPO | Admitting: Pulmonary Disease

## 2020-01-01 VITALS — BP 124/72 | HR 84 | Temp 97.1°F | Ht 67.0 in | Wt 219.8 lb

## 2020-01-01 DIAGNOSIS — J8410 Pulmonary fibrosis, unspecified: Secondary | ICD-10-CM

## 2020-01-01 DIAGNOSIS — Z5181 Encounter for therapeutic drug level monitoring: Secondary | ICD-10-CM

## 2020-01-01 DIAGNOSIS — J449 Chronic obstructive pulmonary disease, unspecified: Secondary | ICD-10-CM | POA: Diagnosis not present

## 2020-01-01 NOTE — Progress Notes (Signed)
Andre [name]    [id number]    01/15/1936  Primary Care Physician:Sonnenberg, Angela Adam, MD  Referring Physician: [name], MD [address] [place],  [place] [address]  Chief complaint: Follow-up for COPD, asbestos related pulmonary fibrosis, sleep apnea.  HPI: 84 year old with history of pulmonary fibrosis, recurrent respiratory failure, eosinophilic pleural effusion, chronic diastolic heart failure, sleep apnea, Parkinson's,  Previously followed [facility].  He was also evaluated at [facility] for pulmonary fibrosis thought to be secondary to prior asbestos exposure.  Recommendation was made to follow-up with a CT in January 2020.  However he cannot keep the appointment due to high co-pay and wishes to transfer care to [facility].  He has had at least 3 admissions over the past year for recurrent respiratory failure, HCAP, right pleural effusion status post thoracentesis on 09/19/2017 with cell count showing exudate with elevated eosinophils.   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  Has run 89 marathons in the past.  After his CABG he took up long distance cycling which he had to give up about 10 years ago.  Initially on Anoro.  This was changed to [facility] in May 2020 as he did not achieve adequate inspiratory flow. Treated with antibiotics in December 2020 with chest x-ray showing right lung pneumonia for which he was treated with antibiotics Follow-up CT scan shows resolution of pneumonia but shows progression of pulmonary fibrosis.   Started on Ofev 05/27/19.    Pets: No pets Occupation: Worked as a Actor ILD questionnaire 05/15/2018: Exposure to asbestos, no other significant exposure. Smoking history:20-pack-year smoker.  Quit smoking in 1985 Travel history: No significant recent travel Relevant family history: No family history of lung disease.  Interim History: Has been on Ofev since February of this year. This  has been poorly tolerated with nausea, diarrhea We have reduced her Ofev dose to 100 mg but continues to have symptoms Stopped Ofev 2 weeks ago with some improvement in symptoms  Overall feels that his breathing is improved.  Not taking the Anoro on a regular basis.  Outpatient Encounter Medications as of 01/01/2020  Medication Sig   albuterol (PROVENTIL) (2.5 MG/3ML) 0.083% nebulizer solution USE ONE AMPULE(3ML) VIA NEBULIZER EVERY 4 HOURS AS NEEDED FOR WHEEZING OR SHORTNESS OF BREATH   buPROPion (WELLBUTRIN XL) 300 MG 24 hr tablet Take 1 tablet (300 mg total) by mouth daily.   carbidopa-levodopa (SINEMET IR) 25-250 MG tablet TAKE TWO TABLETS 3 TIMES DAILY   carvedilol (COREG) 3.125 MG tablet TAKE TWO TABLETS TWICE A DAY WITH MEALS   ELIQUIS 5 MG TABS tablet TAKE ONE TABLET BY MOUTH TWICE DAILY   entacapone (COMTAN) 200 MG tablet Take 1 tablet (200 mg total) by mouth 3 (three) times daily.   esomeprazole (NEXIUM) 40 MG capsule TAKE 1 CAPSULE BY MOUTH ONCE DAILY   fluticasone (FLONASE) 50 MCG/ACT nasal spray USE 2 PUFFS IN EACH NOSTRIL DAILY   furosemide (LASIX) 20 MG tablet Take 20 mg by mouth daily.   HYDROcodone-acetaminophen (NORCO/VICODIN) 5-325 MG tablet Take 1 tablet by mouth every 6 (six) hours as needed for moderate pain or severe pain.   ketoconazole (NIZORAL) 2 % cream    ketorolac (ACULAR) 0.5 % ophthalmic solution SMARTSIG:1 Drop(s) In Eye(s) Every 4-6 Hours PRN   levothyroxine (SYNTHROID) 50 MCG tablet TAKE ONE TABLET ON AN EMPTY STOMACH WITHA GLASS OF WATER AT LEAST 30 TO 60 MINUTES  BEFORE BREAKFAST   losartan (COZAAR) 100 MG tablet TAKE 1 TABLET BY MOUTH DAILY   mometasone (ELOCON) 0.1 % cream    mupirocin ointment (BACTROBAN) 2 % Apply 1 application topically 3 (three) times daily.   potassium chloride SA (KLOR-CON) 10 MEQ tablet Take 1 tablet (10 mEq total) by mouth daily.   PROAIR HFA 108 (90 Base) MCG/ACT inhaler INHALE 2 PUFFS INTO THE LUNGS EVERY SIX  HOURS AS NEEDED FOR WHEEZING OR SHORTNESS OF BREATH   Respiratory Therapy Supplies (FLUTTER) DEVI 1 Device by Does not apply route daily.   rosuvastatin (CRESTOR) 10 MG tablet TAKE 1 TABLET BY MOUTH DAILY   Tiotropium Bromide-Olodaterol (STIOLTO RESPIMAT) 2.5-2.5 MCG/ACT AERS Inhale 2 puffs into the lungs daily.   Nintedanib (OFEV) 100 MG CAPS Take 1 capsule (100 mg total) by mouth 2 (two) times daily. (Patient not taking: Reported on 01/01/2020)   No facility-administered encounter medications on file as of 01/01/2020.   Physical Exam: Blood pressure 124/72, pulse 84, temperature (!) 97.1 F (36.2 C), temperature source Oral, height 5\' 7"  (1.702 m), weight 219 lb 12.8 oz (99.7 kg), SpO2 94 %. Gen:      No acute distress HEENT:  EOMI, sclera anicteric Neck:     No masses; no thyromegaly Lungs:    Bibasal crackles CV:         Regular rate and rhythm; no murmurs Abd:      + bowel sounds; soft, non-tender; no palpable masses, no distension Ext:    No edema; adequate peripheral perfusion Skin:      Warm and dry; no rash Neuro: alert and oriented x 3 Psych: normal mood and affect  Data Reviewed: Imaging: CT chest 12/06/2011- peripheral and basal fibrotic changes, calcified pleural plaques.  CT chest 06/09/2015- peripheral and basal fibrotic changes with no honeycombing.  Calcified pleural plaques  CT high-resolution 05/30/2017- moderate centrilobular emphysema, mild basilar subpleural reticulation, groundglass and traction bronchiectasis.  No honeycombing.  Calcified granuloma in the right upper lobe, calcified pleural plaques.  CT high-resolution 06/08/2018- moderate emphysema, mild basilar reticulation, traction bronchiectasis.  No honeycombing, calcified pleural plaques with right pleural thickening.  Stable calcified granuloma.  I have reviewed the images personally. I have reviewed the images personally.  CT chest high-resolution [[facility]] 01/22/2018 1. Primarily basilar peripheral  interlobular septal thickening. Findings may represent changes of chronic edema vs early fibrosis. New small right and trace left pleural effusions with associated calcified pleural plaques. Correlate with history of asbestos exposure.  2. Apparent increased thickening of the right basilar pleura, incompletely assessed. Consider PET/CT or pleural fluid sampling for further evaluation if not already obtained. Differential considerations include fibrosis or malignancy such as mesothelioma. 3. Aortic valve calcifications.  4. Scattered less than 3 mm pulmonary nodules. If the patient is low risk for lung cancer, no further follow-up is recommended. If the patient is high risk for lung cancer, consider 12 month follow-up CT. (2017 Fleischner Guidelines)  PFTs: 09/25/2012 FVC 2.52 [69%], FEV1 1.95 [75%), F/F 77, TLC 72%, DLCO 70% Moderate obstructive airways, minimal restriction and diffusion defect.  06/12/2018 FVC 2.38 [16%], FEV1 1.66 [66%], F/F 70, TLC 3.95 (61%), DLCO 16.34 [73%] Moderate obstruction with restriction and minimal diffusion defect  Labs: ANA 01/22/2018- negative, CCP-negative  Pleural fluid 09/19/2017- LDH 462, total protein 3.9 WBC 3661, 59% eos, 27% lymphs, 10% neutrophils Cytology-mixed reactive and inflammatory cells.  No malignancy.  Hepatic panel 2/22-stable  Cardiac Echocardiogram 08/28/2017- LVEF 54-00%, grade 2 diastolic dysfunction, mild aortic  stenosis.  Severe dilatation of left atrium and right atrium, trivial pericardial effusion.  Cardiac catheterization 09/18/2017 Significant three-vessel coronary artery disease with patent grafts.  Right heart catheterization shows mildly elevated filling pressures, minimal pulmonary hypertension Wedge 12, PA pressure 39/17 (24), cardiac output 5.08 L/min PVR 189, 2.4 Wood units  Sleep CPAP titration 11/04/2015- CPAP titrated to 12 cm of water.  Download 05/14/2018 Set pressure 12 cm of water 100% usage greater  than 4 hours Residual AHI 1.2  Download 03/24/2019 Set pressure of 12 cm 100% usage greater than 4 hours, residual AHI 1.8  Assessment:  Pulmonary fibrosis, asbestosis CT scan reviewed with basilar fibrosis, pleural calcifications consistent with asbestos exposure Due to progression of pulmonary fibrosis he has been initiated on Ofev. Having issues with GI symptoms as side effects. Discussed alternate medication such as Esbriet but he wants to go Ofev 1 more chance Restart at 100 mg a day in 2 weeks.  Reassess in 3 months  Dyspnea is multifactorial from ILD, obesity, diastolic heart failure, coronary artery disease Suspect anxiety is a significant component to his episodes of dyspnea Will benefit from pulmonary rehab but he will like to hold off until the Covid pandemic has improved  Emphysema COPD PFTs reviewed with moderate obstruction.   Continue inhalers Continue flutter valve for mucociliary clearance  Right pleural effusion exudative, eosinophilic S.p thoracentesis dring hospitalization in 2019.  The cell count is eosinophilic which could be from pneumonia versus asbestos-related effusion Follow-up CT shows thickening of the pleura in that area We will need to monitor this area for possible mesothelioma.   Obstructive sleep apnea Stable on CPAP Download reviewed with good compliance and effectiveness.    Health maintenance 12/11/2018 -influenza 05/10/2016-Prevnar  This appointment required 45 minutes of patient care (this includes precharting, chart review, review of results, face-to-face care, etc.).  Plan/Recommendations: Resume Ofev at 100 mg a day, CPAP Stiolto, flutter valve Follow-up in 3 months  Marshell Garfinkel MD [facility] 01/01/2020, 10:48 AM  CC: [name], MD

## 2020-01-01 NOTE — Patient Instructions (Signed)
Hold off for for 2 more weeks and then start using 1 pill once a day Monitor your symptoms of nausea, diarrhea Follow-up in 3 months.

## 2020-01-01 NOTE — Telephone Encounter (Signed)
Lets keep both appointments as the 10/4 appointment is just for an acute issue.

## 2020-01-02 DIAGNOSIS — J441 Chronic obstructive pulmonary disease with (acute) exacerbation: Secondary | ICD-10-CM | POA: Diagnosis not present

## 2020-01-02 DIAGNOSIS — J449 Chronic obstructive pulmonary disease, unspecified: Secondary | ICD-10-CM | POA: Diagnosis not present

## 2020-01-02 DIAGNOSIS — G4733 Obstructive sleep apnea (adult) (pediatric): Secondary | ICD-10-CM | POA: Diagnosis not present

## 2020-01-02 DIAGNOSIS — J45909 Unspecified asthma, uncomplicated: Secondary | ICD-10-CM | POA: Diagnosis not present

## 2020-01-06 ENCOUNTER — Telehealth: Payer: Self-pay | Admitting: Neurology

## 2020-01-06 NOTE — Telephone Encounter (Signed)
[name],Andre(wife on DPR) is calling asking if Dr Jannifer Franklin should be managing pt's pt's depression medication since it is as a result of pt's Parkinson's disease, please call

## 2020-01-07 DIAGNOSIS — G4733 Obstructive sleep apnea (adult) (pediatric): Secondary | ICD-10-CM | POA: Diagnosis not present

## 2020-01-07 DIAGNOSIS — J441 Chronic obstructive pulmonary disease with (acute) exacerbation: Secondary | ICD-10-CM | POA: Diagnosis not present

## 2020-01-07 DIAGNOSIS — J449 Chronic obstructive pulmonary disease, unspecified: Secondary | ICD-10-CM | POA: Diagnosis not present

## 2020-01-07 DIAGNOSIS — J45909 Unspecified asthma, uncomplicated: Secondary | ICD-10-CM | POA: Diagnosis not present

## 2020-01-07 MED ORDER — BUPROPION HCL ER (SR) 150 MG PO TB12: ORAL_TABLET | ORAL | 3 refills | Status: DC

## 2020-01-07 NOTE — Telephone Encounter (Signed)
I called the patient.  He is having some ongoing depression, he is on Wellbutrin taking 300 mg daily, this is a good selection for antidepressants, but may need to maximize the dose to 450 mg daily.  I will send in a prescription to accommodate this.  The patient is also reporting some problems with dizziness with standing.  He is able to check his blood pressures at home.  He is on Coreg, Lasix, and losartan.  If his blood pressures are dropping below 100 with standing, he may need to contact his primary doctor to readjust some of his blood pressure medications.  The history of Parkinson's disease may put him at risk for orthostatic hypotension as well.

## 2020-01-07 NOTE — Addendum Note (Signed)
Addended by: Kathrynn Ducking on: 01/07/2020 12:14 PM   Modules accepted: Orders

## 2020-01-15 ENCOUNTER — Encounter: Payer: Self-pay | Admitting: Dermatology

## 2020-01-20 ENCOUNTER — Ambulatory Visit: Payer: PPO | Admitting: Family Medicine

## 2020-01-29 ENCOUNTER — Telehealth: Payer: Self-pay | Admitting: Cardiovascular Disease

## 2020-01-29 NOTE — Telephone Encounter (Signed)
Spoke with the patients wife. Advised her that Chronic heart failure with preserved EF is listed on the patients diagnosis list.  Pt wife verbalized understanding and voiced appreciation for the call.

## 2020-01-29 NOTE — Telephone Encounter (Signed)
Patient  Wife looking at insurance plans and needs to know if patient has a dx of chronic heart failure .  Please call

## 2020-02-03 ENCOUNTER — Other Ambulatory Visit: Payer: Self-pay | Admitting: Family Medicine

## 2020-02-03 DIAGNOSIS — F419 Anxiety disorder, unspecified: Secondary | ICD-10-CM

## 2020-02-03 DIAGNOSIS — F32A Depression, unspecified: Secondary | ICD-10-CM

## 2020-02-03 IMAGING — CT CT ABD-PEL WO/W CM
2 of 6 series · 12 of 32 positions shown, 17 images · IV contrast (APPLIED)
Comparison: 03/07/2006 abdominal CT.  Chest CT 05/30/2017.

CLINICAL DATA: Gross hematuria and pain. Symptoms a couple months
ago.

EXAM:
CT ABDOMEN AND PELVIS WITHOUT AND WITH CONTRAST
TECHNIQUE: Multidetector CT imaging of the abdomen and pelvis was performed
following the standard protocol before and following the bolus
administration of intravenous contrast.
CONTRAST:  125mL OMNIPAQUE IOHEXOL 300 MG/ML  SOLN

[Series 2: axial pre · axial · non-contrast · 0.84mm/px · z∈[-885,-510]mm · 6 of 106 slices shown, 11 images]
[im 16/106  soft-tissue]
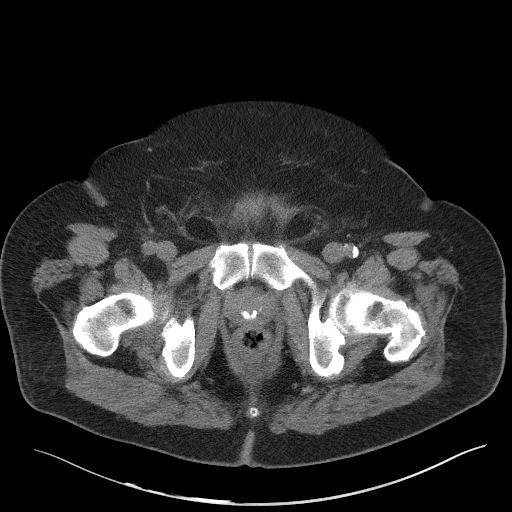
[im 16/106  bone]
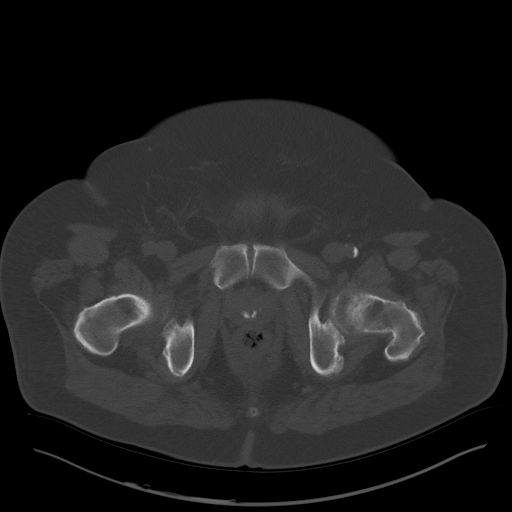
[im 31/106  soft-tissue]
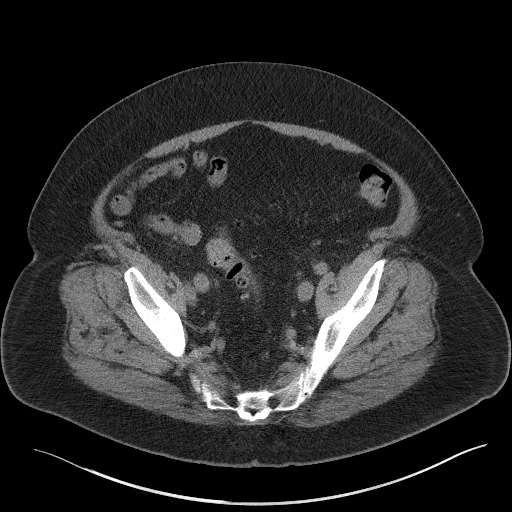
[im 46/106  soft-tissue]
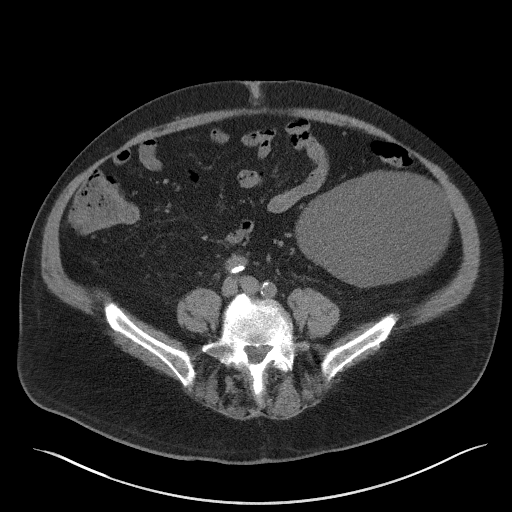
[im 46/106  lung]
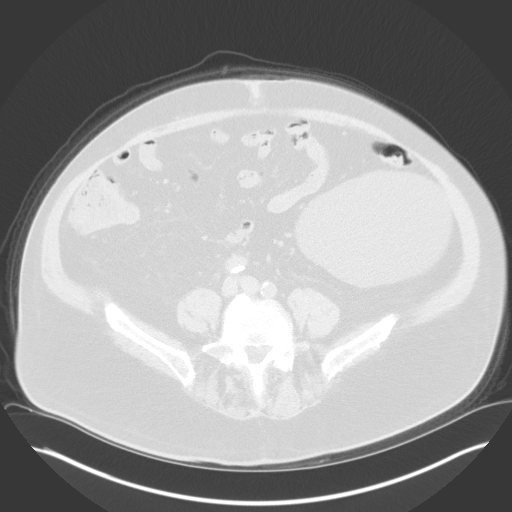
[im 61/106  soft-tissue]
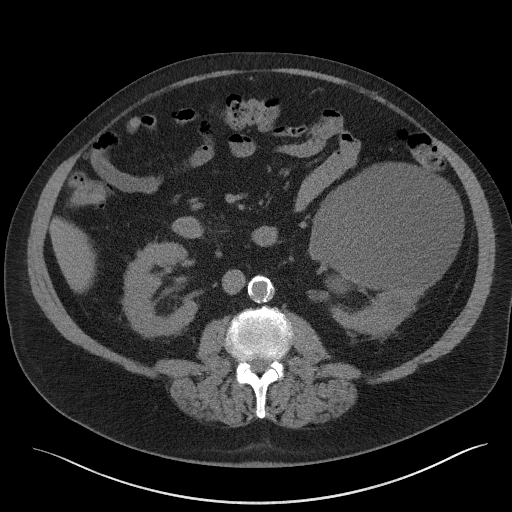
[im 61/106  lung]
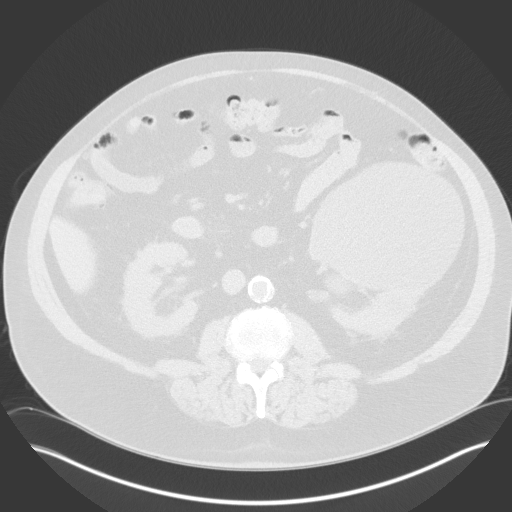
[im 76/106  soft-tissue]
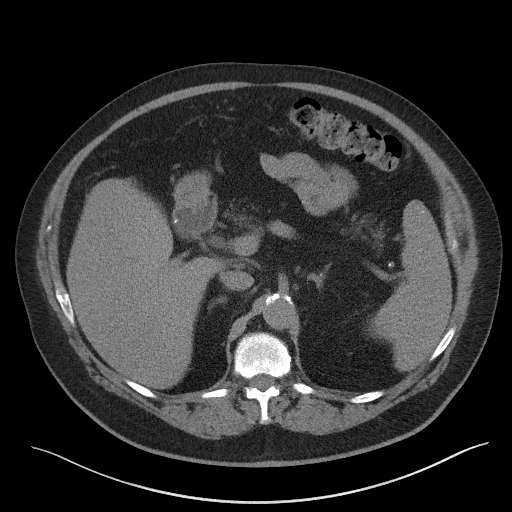
[im 76/106  lung]
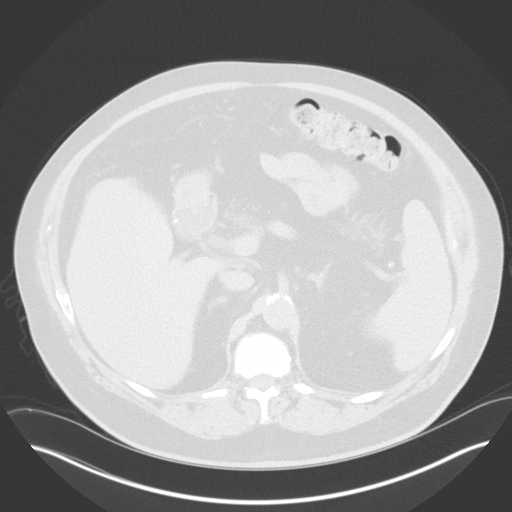
[im 91/106  soft-tissue]
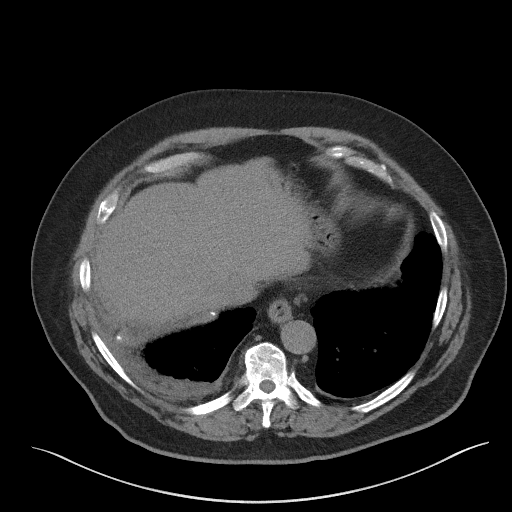
[im 91/106  lung]
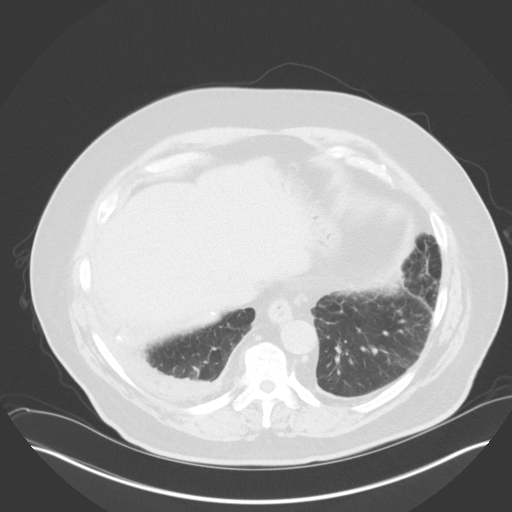

[Series 4: axial post · axial · 0.83mm/px · z∈[-882,-522]mm · 6 of 102 slices shown]
[im 15/102  soft-tissue]
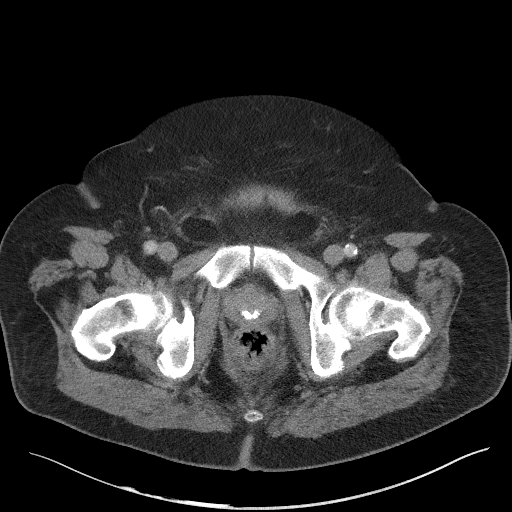
[im 29/102  soft-tissue]
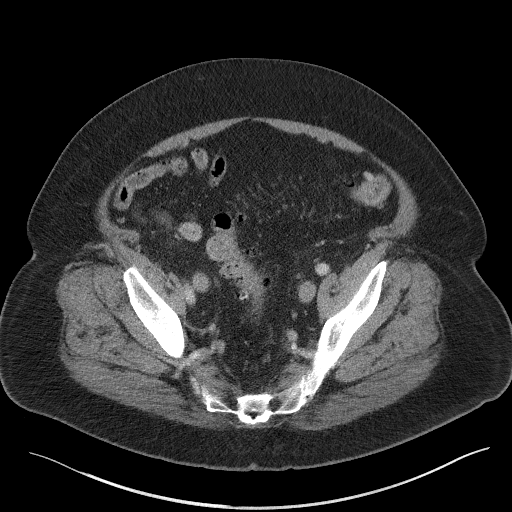
[im 44/102  soft-tissue]
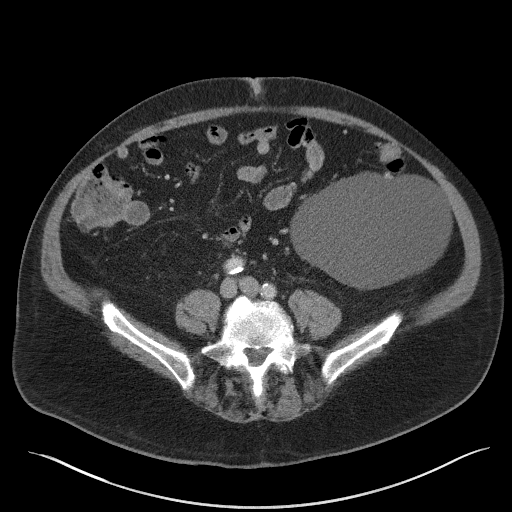
[im 58/102  soft-tissue]
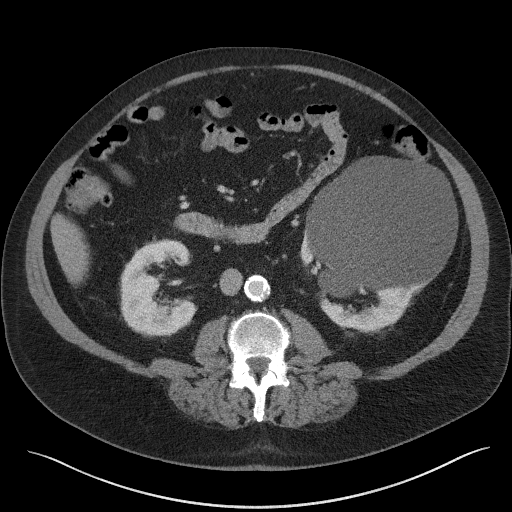
[im 73/102  soft-tissue]
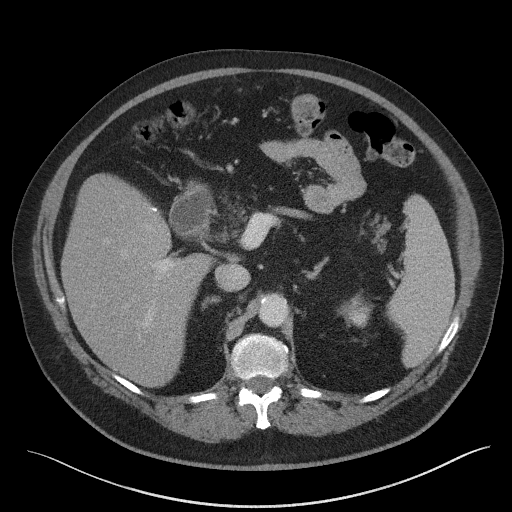
[im 87/102  soft-tissue]
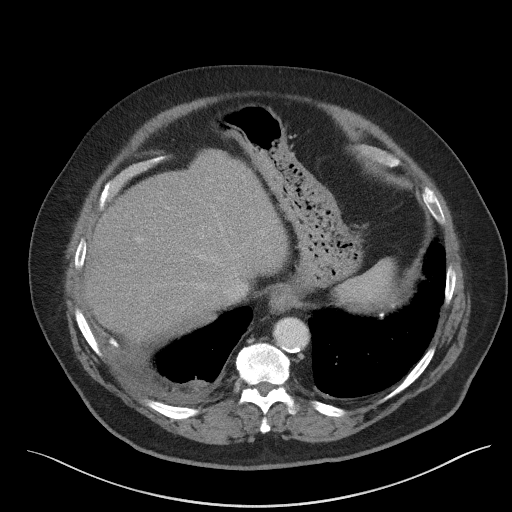

[12 of 32 positions shown; findings below may reference images not displayed]

FINDINGS: Lower chest: Subpleural interstitial prominence. Bilateral calcified
pleural plaques. A small right pleural effusion is new compared to
the prior CT. Mild cardiomegaly with right coronary artery
atherosclerosis.

Hepatobiliary: Mild hepatomegaly at 18.6 cm craniocaudal.
Nonspecific caudate lobe enlargement. Cholecystectomy, without
biliary ductal dilatation.

Pancreas: Fatty replacement throughout the pancreas.

Spleen: Old granulomatous disease within.

Adrenals/Urinary Tract: Punctate adrenal calcifications may relate
to prior infection.

No renal calculi or hydronephrosis. No hydroureter or ureteric
calculi. Punctate bladder stone on image 86/2.

Bilateral too small to characterize renal lesions. An anterior
interpolar left renal 13.7 cm cyst. Poor to moderate renal
collecting system opacification on delayed images. Good ureteric
opacification, without filling defect. No enhancing bladder mass or
filling defect on delayed images.

Stomach/Bowel: Normal stomach, without wall thickening. Scattered
colonic diverticula. Normal terminal ileum and appendix. Normal
small bowel.

Vascular/Lymphatic: Advanced aortic and branch vessel
atherosclerosis. No abdominopelvic adenopathy.

Reproductive: Normal prostate. Suspect right inguinal position of
the right testicle including on image 102/4.

Other: No significant free fluid.

Musculoskeletal: Lumbosacral degenerative disc disease.
IMPRESSION: 1. Punctate bladder calculus.  No other explanation for hematuria.
2. Dominant left renal cyst of 13.7 cm.  No complicating features.
3. Asbestos related pleural disease and probable mild asbestosis.
New right pleural effusion since the CT of 05/30/2017.
4. Probable inguinal position of the right testicle. Consider
physical exam correlation.

## 2020-02-04 ENCOUNTER — Ambulatory Visit: Payer: PPO | Admitting: Family Medicine

## 2020-02-05 ENCOUNTER — Other Ambulatory Visit: Payer: Self-pay

## 2020-02-05 ENCOUNTER — Ambulatory Visit: Payer: PPO | Admitting: Dermatology

## 2020-02-05 DIAGNOSIS — D692 Other nonthrombocytopenic purpura: Secondary | ICD-10-CM

## 2020-02-05 DIAGNOSIS — L570 Actinic keratosis: Secondary | ICD-10-CM

## 2020-02-05 DIAGNOSIS — L578 Other skin changes due to chronic exposure to nonionizing radiation: Secondary | ICD-10-CM | POA: Diagnosis not present

## 2020-02-05 DIAGNOSIS — L219 Seborrheic dermatitis, unspecified: Secondary | ICD-10-CM

## 2020-02-05 MED ORDER — KETOCONAZOLE 2 % EX SHAM: 1.0000 "application " | MEDICATED_SHAMPOO | CUTANEOUS | 3 refills | Status: DC

## 2020-02-05 MED ORDER — HYDROCORTISONE 2.5 % EX LOTN: TOPICAL_LOTION | Freq: Every day | CUTANEOUS | 3 refills | Status: DC

## 2020-02-05 NOTE — Progress Notes (Signed)
   Follow-Up Visit   Subjective  Andre [name] is a 84 y.o. male who presents for the following: Actinic Keratosis (follow up of scalp - 5FU/Calcipotriene x 7 days- finished ~2 weeks ago).  The following portions of the chart were reviewed this encounter and updated as appropriate:  Tobacco  Allergies  Meds  Problems  Med Hx  Surg Hx  Fam Hx     Review of Systems:  No other skin or systemic complaints except as noted in HPI or Assessment and Plan.  Objective  Well appearing patient in no apparent distress; mood and affect are within normal limits.  A focused examination was performed including face, scalp, arms. Relevant physical exam findings are noted in the Assessment and Plan.  Objective  Scalp: Pinkness and crusting of scalp.    Objective  Postauricular areas: Pinkness and scale   Assessment & Plan    Actinic Damage - diffuse scaly erythematous macules with underlying dyspigmentation - Recommend daily broad spectrum sunscreen SPF 30+ to sun-exposed areas, reapply every 2 hours as needed.  - Call for new or changing lesions.  Purpura - Violaceous macules and patches - Benign - Related to age, sun damage and/or use of blood thinners - Observe - Can use OTC arnica containing moisturizer such as Dermend Bruise Formula if desired - Call for worsening or other concerns   AK (actinic keratosis) Scalp  Healing as expected from topical treatment. Recheck on follow up.  Seborrheic dermatitis Postauricular areas  Start Hydrocortisone 2.5% lotion 4 times per week, Ketoconazole 2% shampoo 3 times per week   hydrocortisone 2.5 % lotion - Postauricular areas  ketoconazole (NIZORAL) 2 % shampoo - Postauricular areas  Return in about 4 months (around 06/07/2020) for AK follow up.  I, Ashok Cordia, CMA, am acting as scribe for Sarina Ser, MD .  Documentation: I have reviewed the above documentation for accuracy and completeness, and I agree with the  above.  Sarina Ser, MD

## 2020-02-10 ENCOUNTER — Other Ambulatory Visit: Payer: Self-pay | Admitting: Cardiovascular Disease

## 2020-02-10 ENCOUNTER — Encounter: Payer: Self-pay | Admitting: Dermatology

## 2020-02-10 NOTE — Telephone Encounter (Signed)
Pt's age 84, wt 99.7 kg, SCr 0.96, CrCl 83.66, last ov w/ MA 12/12/19.

## 2020-02-10 NOTE — Telephone Encounter (Signed)
Refill request

## 2020-02-18 ENCOUNTER — Other Ambulatory Visit: Payer: Self-pay | Admitting: Cardiovascular Disease

## 2020-02-18 NOTE — Telephone Encounter (Signed)
Rx request sent to pharmacy.  

## 2020-03-03 ENCOUNTER — Ambulatory Visit (INDEPENDENT_AMBULATORY_CARE_PROVIDER_SITE_OTHER): Payer: PPO | Admitting: Family Medicine

## 2020-03-03 ENCOUNTER — Other Ambulatory Visit: Payer: Self-pay

## 2020-03-03 ENCOUNTER — Encounter: Payer: Self-pay | Admitting: Family Medicine

## 2020-03-03 VITALS — BP 118/70 | HR 53 | Temp 97.6°F | Ht 67.0 in | Wt 219.8 lb

## 2020-03-03 DIAGNOSIS — F419 Anxiety disorder, unspecified: Secondary | ICD-10-CM | POA: Diagnosis not present

## 2020-03-03 DIAGNOSIS — I10 Essential (primary) hypertension: Secondary | ICD-10-CM | POA: Diagnosis not present

## 2020-03-03 DIAGNOSIS — F32A Depression, unspecified: Secondary | ICD-10-CM | POA: Diagnosis not present

## 2020-03-03 DIAGNOSIS — R5383 Other fatigue: Secondary | ICD-10-CM | POA: Diagnosis not present

## 2020-03-03 LAB — CBC
HCT: 49.5 % (ref 39.0–52.0)
Hemoglobin: 16.1 g/dL (ref 13.0–17.0)
MCHC: 32.5 g/dL (ref 30.0–36.0)
MCV: 90.1 fl (ref 78.0–100.0)
Platelets: 180 10*3/uL (ref 150.0–400.0)
RBC: 5.49 Mil/uL (ref 4.22–5.81)
RDW: 14.9 % (ref 11.5–15.5)
WBC: 10.6 10*3/uL — ABNORMAL HIGH (ref 4.0–10.5)

## 2020-03-03 LAB — VITAMIN B12: Vitamin B-12: 209 pg/mL — ABNORMAL LOW (ref 211–911)

## 2020-03-03 LAB — TSH: TSH: 3.61 u[IU]/mL (ref 0.35–4.50)

## 2020-03-03 LAB — VITAMIN D 25 HYDROXY (VIT D DEFICIENCY, FRACTURES): VITD: 31.64 ng/mL (ref 30.00–100.00)

## 2020-03-03 MED ORDER — SERTRALINE HCL 50 MG PO TABS: 50.0000 mg | ORAL_TABLET | Freq: Every day | ORAL | 3 refills | Status: DC

## 2020-03-03 MED ORDER — BUPROPION HCL ER (SR) 150 MG PO TB12: 150.0000 mg | ORAL_TABLET | Freq: Two times a day (BID) | ORAL | 1 refills | Status: DC

## 2020-03-03 NOTE — Assessment & Plan Note (Signed)
Continues to have issues with depression.  Has not been taking his Wellbutrin appropriately.  We will have him change the Wellbutrin to 150 mg twice daily.  He will take this for 1 week and then start on Zoloft 50 mg daily.  He will let us know if he has any side effects.  Given the fatigue we will check some lab work though I suspect likely his depression is contributing.

## 2020-03-03 NOTE — Patient Instructions (Signed)
Nice to see you. Please start taking your Wellbutrin 1 tablet in the morning and 1 tablet in the evening.  After you have been on that for a week please start on the Zoloft.  If you notice any side effects that we discussed please let me know. We will contact you with your lab results.

## 2020-03-03 NOTE — Assessment & Plan Note (Signed)
Adequate control.  Continue carvedilol 3.125 mg twice daily, losartan 100 mg once daily, and Lasix 40 mg daily.

## 2020-03-03 NOTE — Progress Notes (Signed)
[name], MD Phone: [phone]  Andre [name] is a 84 y.o. male who presents today for follow-up.  Depression: Patient notes he has his ups and downs.  He is only been taking Wellbutrin once daily.  He notes he tries to shake it off when he does get depressed though will feel depressed intermittently.  He notes it is related to him not being able to do what he used to do.  He does sleep a lot when he gets depressed.  No SI.  They do note some fatigue yesterday though nothing consistent.  They wonder about checking labs for that.  Hypertension: Not checking blood pressures.  Taking carvedilol, Lasix, and losartan.  No chest pain or edema.  Does have chronic stable dyspnea.  Social History   Tobacco Use  Smoking Status Former Smoker  . Packs/day: 1.00  . Years: 10.00  . Pack years: 10.00  . Types: Cigarettes, Pipe, Cigars  . Quit date: 04/18/1972  . Years since quitting: 47.9  Smokeless Tobacco Former Systems developer  . Types: Chew  . Quit date: 04/18/1972     ROS see history of present illness  Objective  Physical Exam Vitals:   03/03/20 1256  BP: 118/70  Pulse: (!) 53  Temp: 97.6 F (36.4 C)  SpO2: 99%    BP Readings from Last 3 Encounters:  03/03/20 118/70  01/01/20 124/72  12/24/19 (!) 150/78   Wt Readings from Last 3 Encounters:  03/03/20 219 lb 12.8 oz (99.7 kg)  01/01/20 219 lb 12.8 oz (99.7 kg)  12/24/19 222 lb (100.7 kg)    Physical Exam Constitutional:      General: He is not in acute distress.    Appearance: He is not diaphoretic.  Cardiovascular:     Rate and Rhythm: Normal rate and regular rhythm.     Heart sounds: Normal heart sounds.  Pulmonary:     Effort: Pulmonary effort is normal.     Breath sounds: Normal breath sounds.  Musculoskeletal:     Right lower leg: No edema.     Left lower leg: No edema.  Skin:    General: Skin is warm and dry.  Neurological:     Mental Status: He is alert.      Assessment/Plan: Please see individual  problem list.  Problem List Items Addressed This Visit    Anxiety and depression - Primary    Continues to have issues with depression.  Has not been taking his Wellbutrin appropriately.  We will have him change the Wellbutrin to 150 mg twice daily.  He will take this for 1 week and then start on Zoloft 50 mg daily.  He will let us know if he has any side effects.  Given the fatigue we will check some lab work though I suspect likely his depression is contributing.      Relevant Medications   buPROPion (WELLBUTRIN SR) 150 MG 12 hr tablet   sertraline (ZOLOFT) 50 MG tablet   Essential hypertension (Chronic)    Adequate control.  Continue carvedilol 3.125 mg twice daily, losartan 100 mg once daily, and Lasix 40 mg daily.       Other Visit Diagnoses    Fatigue, unspecified type       Relevant Orders   CBC   TSH   B12   Vitamin D (25 hydroxy)      This visit occurred during the SARS-CoV-2 public health emergency.  Safety protocols were in place, including screening questions prior to the  visit, additional usage of staff PPE, and extensive cleaning of exam room while observing appropriate contact time as indicated for disinfecting solutions.    [name], MD [place]

## 2020-03-04 ENCOUNTER — Telehealth: Payer: Self-pay | Admitting: Family Medicine

## 2020-03-04 DIAGNOSIS — E538 Deficiency of other specified B group vitamins: Secondary | ICD-10-CM

## 2020-03-04 NOTE — Telephone Encounter (Signed)
[name] called for lab results. Patient saw results in [place] and has questions. Please call cell phone number.

## 2020-03-05 NOTE — Telephone Encounter (Signed)
[name] called for lab results. Patient saw results in [place] and has questions. Please call cell phone number. Oretha Weismann,cma

## 2020-03-06 NOTE — Telephone Encounter (Signed)
LVM for the patient to call back for lab results.  Andre [name],cma

## 2020-03-06 NOTE — Telephone Encounter (Signed)
Please let him know his B12 is low.  This could be contributing to his fatigue.  I would suggest starting on B12 injections 1000 mcg once weekly for 3 weeks followed by once monthly injections.  His other lab work is acceptable.  Thanks.

## 2020-03-09 MED ORDER — CYANOCOBALAMIN 1000 MCG/ML IJ SOLN: 1000.0000 ug | INTRAMUSCULAR | Status: AC

## 2020-03-09 NOTE — Telephone Encounter (Signed)
I spoke with the patient today and informed him of his lab results, He is scheduled for his 1st B12 injection on tomorrow as a nurse visit.  Can you put in the order.  Desera Graffeo,cma

## 2020-03-09 NOTE — Telephone Encounter (Signed)
This is ordered and patient is coming tomorrow for 1st injection.  Arlan Birks,cma

## 2020-03-09 NOTE — Telephone Encounter (Signed)
He should not need an order for the B12 injections. Typically the person doing the nurse visit places the order and I co-sign it.

## 2020-03-10 ENCOUNTER — Ambulatory Visit (INDEPENDENT_AMBULATORY_CARE_PROVIDER_SITE_OTHER): Payer: PPO

## 2020-03-10 ENCOUNTER — Other Ambulatory Visit: Payer: Self-pay

## 2020-03-10 DIAGNOSIS — F32A Depression, unspecified: Secondary | ICD-10-CM | POA: Diagnosis not present

## 2020-03-10 DIAGNOSIS — F419 Anxiety disorder, unspecified: Secondary | ICD-10-CM

## 2020-03-10 DIAGNOSIS — E538 Deficiency of other specified B group vitamins: Secondary | ICD-10-CM

## 2020-03-10 MED ORDER — CYANOCOBALAMIN 1000 MCG/ML IJ SOLN
1000.0000 ug | Freq: Once | INTRAMUSCULAR | Status: AC
  Administered 2020-03-10: 1000 ug via INTRAMUSCULAR

## 2020-03-10 NOTE — Progress Notes (Signed)
Patient presented for B 12 injection to left deltoid, patient voiced no concerns nor showed any signs of distress during injection. 

## 2020-03-17 ENCOUNTER — Other Ambulatory Visit: Payer: Self-pay

## 2020-03-17 ENCOUNTER — Ambulatory Visit (INDEPENDENT_AMBULATORY_CARE_PROVIDER_SITE_OTHER): Payer: PPO

## 2020-03-17 DIAGNOSIS — E538 Deficiency of other specified B group vitamins: Secondary | ICD-10-CM | POA: Diagnosis not present

## 2020-03-17 MED ORDER — CYANOCOBALAMIN 1000 MCG/ML IJ SOLN
1000.0000 ug | Freq: Once | INTRAMUSCULAR | Status: AC
  Administered 2020-03-17: 1000 ug via INTRAMUSCULAR

## 2020-03-17 NOTE — Progress Notes (Signed)
Patient presented for B 12 injection to left deltoid, patient voiced no concerns nor showed any signs of distress during injection. 

## 2020-03-18 ENCOUNTER — Telehealth: Payer: Self-pay | Admitting: Family Medicine

## 2020-03-18 MED ORDER — LACTULOSE 10 GM/15ML PO SOLN: 20.0000 g | Freq: Two times a day (BID) | ORAL | 0 refills | Status: DC | PRN

## 2020-03-18 NOTE — Telephone Encounter (Signed)
Patients wife has been notified. She is on her way to get the medication.

## 2020-03-18 NOTE — Telephone Encounter (Signed)
Left message for patient to return call back.  

## 2020-03-18 NOTE — Telephone Encounter (Signed)
Left message for patient to return call back. Patient needs to go to [facility] and triaged.

## 2020-03-18 NOTE — Addendum Note (Signed)
Addended by: Crecencio Mc on: 03/18/2020 03:30 PM   Modules accepted: Orders

## 2020-03-18 NOTE — Telephone Encounter (Signed)
[facility].  Take one dose every 4 hours until constipation is relieved

## 2020-03-18 NOTE — Telephone Encounter (Signed)
[name] called and said that patient's last stool was several days ago. She has tried everything over the counter to help him go. Can Dr. [name] prescribe something to help is bowel movements.

## 2020-03-18 NOTE — Telephone Encounter (Signed)
Patient has stated he has these issues every so often. They usually take every OTC medication to help him bm. Patient has made a bm every three to four days. Patient wife almost had to take him to ER due to abdominal pain, he then had a BM and felt better. She called for an appointment, we where unable to accommodate patient. Instructed wife to got o UC which she said they would prefer an virtual appointment. Patient stated they are gonna try Evisit on mychart or call the pharmacist to see what they can take to help with SX.

## 2020-03-23 ENCOUNTER — Telehealth: Payer: Self-pay

## 2020-03-23 NOTE — Telephone Encounter (Signed)
Wife stated husband has been sleeping more than usual since starting Zoloft. Please advise.  Otto Caraway,cma

## 2020-03-23 NOTE — Telephone Encounter (Signed)
Pt's wife called and states that patient has been sleeping a lot more since starting Zoloft. Please call her back

## 2020-03-24 ENCOUNTER — Other Ambulatory Visit: Payer: Self-pay

## 2020-03-24 ENCOUNTER — Ambulatory Visit (INDEPENDENT_AMBULATORY_CARE_PROVIDER_SITE_OTHER): Payer: PPO

## 2020-03-24 DIAGNOSIS — E538 Deficiency of other specified B group vitamins: Secondary | ICD-10-CM | POA: Diagnosis not present

## 2020-03-24 MED ORDER — CYANOCOBALAMIN 1000 MCG/ML IJ SOLN
1000.0000 ug | Freq: Once | INTRAMUSCULAR | Status: AC
  Administered 2020-03-24: 1000 ug via INTRAMUSCULAR

## 2020-03-24 NOTE — Progress Notes (Addendum)
Patient presented for B 12 injection to left deltoid, patient voiced no concerns nor showed any signs of distress during injection.  Reviewed.  Dr Scott 

## 2020-03-24 NOTE — Telephone Encounter (Signed)
LVM for the patient's wife to call back.  Marbeth Smedley,cma

## 2020-03-24 NOTE — Telephone Encounter (Signed)
Please see what time of day he is taking it. How much is he sleeping? Does he have any other symptoms?

## 2020-03-27 NOTE — Telephone Encounter (Signed)
LVM for the patient to call back, and I informed her on the message taht I would ask questions from the provider on mychart.  Jonee Lamore,cma

## 2020-03-30 ENCOUNTER — Telehealth: Payer: Self-pay | Admitting: Family Medicine

## 2020-03-30 NOTE — Telephone Encounter (Signed)
Patient was returning call for results 

## 2020-03-30 NOTE — Telephone Encounter (Signed)
I called and spoke with the patient's wife and I reiterated what the provider stated about how to give the patient the Zoloft because she stated he is sleeping to much.  I explained to give the patient the Zoloft at night and see if he does better and if not to let us know and she understood.  Okema Rollinson,cma

## 2020-03-31 ENCOUNTER — Ambulatory Visit: Payer: PPO

## 2020-03-31 ENCOUNTER — Other Ambulatory Visit: Payer: Self-pay

## 2020-03-31 ENCOUNTER — Encounter: Payer: Self-pay | Admitting: Pulmonary Disease

## 2020-03-31 ENCOUNTER — Ambulatory Visit: Payer: PPO | Admitting: Pulmonary Disease

## 2020-03-31 VITALS — BP 134/74 | HR 89 | Temp 97.3°F | Ht 67.0 in | Wt 217.4 lb

## 2020-03-31 DIAGNOSIS — G4733 Obstructive sleep apnea (adult) (pediatric): Secondary | ICD-10-CM | POA: Diagnosis not present

## 2020-03-31 DIAGNOSIS — J8410 Pulmonary fibrosis, unspecified: Secondary | ICD-10-CM

## 2020-03-31 DIAGNOSIS — J449 Chronic obstructive pulmonary disease, unspecified: Secondary | ICD-10-CM

## 2020-03-31 NOTE — Progress Notes (Signed)
Andre [name]    [id number]    02-13-1936  Primary Care Physician:Sonnenberg, Angela Adam, MD  Referring Physician: [name], MD [address] [place],  [place] [address]  Chief complaint: Follow-up for  COPD, asbestos related pulmonary fibrosis, sleep apnea. On Ofev from February to December 613  HPI: 84 year old with history of pulmonary fibrosis, recurrent respiratory failure, eosinophilic pleural effusion, chronic diastolic heart failure, sleep apnea, Parkinson's,  Previously followed [facility].  He was also evaluated at [facility] for pulmonary fibrosis thought to be secondary to prior asbestos exposure.  Recommendation was made to follow-up with a CT in January 2020.  However he cannot keep the appointment due to high co-pay and wishes to transfer care to [facility].  He has had at least 3 admissions over the past year for recurrent respiratory failure, HCAP, right pleural effusion status post thoracentesis on 09/19/2017 with cell count showing exudate with elevated eosinophils.   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  Has run 3 marathons in the past.  After his CABG he took up long distance cycling which he had to give up about 10 years ago.  Initially on Anoro.  This was changed to [facility] in May 2020 as he did not achieve adequate inspiratory flow. Treated with antibiotics in December 2020 with chest x-ray showing right lung pneumonia for which he was treated with antibiotics Follow-up CT scan shows resolution of pneumonia but shows progression of pulmonary fibrosis.   Started on Ofev 05/27/19.    Pets: No pets Occupation: Worked as a Actor ILD questionnaire 05/15/2018: Exposure to asbestos, no other significant exposure. Smoking history:20-pack-year smoker.  Quit smoking in 1985 Travel history: No significant recent travel Relevant family history: No family history of lung disease.  Interim History: Ofev has  been poorly tolerated through the CLL in spite of lowering the dose.  He is finally stopped it 2 weeks ago  He had a 50 pound weight loss on the medication and has a 50 pound weight loss. However states that breathing is doing better  Outpatient Encounter Medications as of 03/31/2020  Medication Sig  . buPROPion (WELLBUTRIN SR) 150 MG 12 hr tablet Take 1 tablet (150 mg total) by mouth 2 (two) times daily.  . carbidopa-levodopa (SINEMET IR) 25-250 MG tablet TAKE TWO TABLETS 3 TIMES DAILY  . carvedilol (COREG) 3.125 MG tablet TAKE TWO TABLETS TWICE A DAY WITH MEALS  . ELIQUIS 5 MG TABS tablet TAKE ONE TABLET BY MOUTH TWICE DAILY  . entacapone (COMTAN) 200 MG tablet Take 1 tablet (200 mg total) by mouth 3 (three) times daily.  [name] esomeprazole (NEXIUM) 40 MG capsule TAKE 1 CAPSULE BY MOUTH ONCE DAILY  . fluticasone (FLONASE) 50 MCG/ACT nasal spray USE 2 PUFFS IN EACH NOSTRIL DAILY  . furosemide (LASIX) 20 MG tablet Take 40 mg by mouth daily.  [name] HYDROcodone-acetaminophen (NORCO/VICODIN) 5-325 MG tablet Take 1 tablet by mouth every 6 (six) hours as needed for moderate pain or severe pain.  . hydrocortisone 2.5 % lotion Apply topically daily. Apply to affected areas 4 times per week.  [name] ketoconazole (NIZORAL) 2 % cream   . ketoconazole (NIZORAL) 2 % shampoo Apply 1 application topically 3 (three) times a week. Wash scalp, face and ears  . ketorolac (ACULAR) 0.5 % ophthalmic solution SMARTSIG:1 Drop(s) In Eye(s) Every 4-6 Hours PRN  . lactulose (CHRONULAC) 10 GM/15ML solution Take 30 mLs (  20 g total) by mouth 2 (two) times daily as needed for moderate constipation.  [name] levothyroxine (SYNTHROID) 50 MCG tablet TAKE ONE TABLET ON AN EMPTY STOMACH WITHA GLASS OF WATER AT LEAST 30 TO 60 MINUTES BEFORE BREAKFAST  . losartan (COZAAR) 100 MG tablet TAKE 1 TABLET BY MOUTH DAILY  . mometasone (ELOCON) 0.1 % cream   . mupirocin ointment (BACTROBAN) 2 % Apply 1 application topically 3 (three) times daily.  .  potassium chloride SA (KLOR-CON) 10 MEQ tablet Take 1 tablet (10 mEq total) by mouth daily.  [name] PROAIR HFA 108 (90 Base) MCG/ACT inhaler INHALE 2 PUFFS INTO THE LUNGS EVERY SIX HOURS AS NEEDED FOR WHEEZING OR SHORTNESS OF BREATH  . Respiratory Therapy Supplies (FLUTTER) DEVI 1 Device by Does not apply route daily.  . rosuvastatin (CRESTOR) 10 MG tablet TAKE 1 TABLET BY MOUTH DAILY  . sertraline (ZOLOFT) 50 MG tablet Take 1 tablet (50 mg total) by mouth daily.  . Tiotropium Bromide-Olodaterol (STIOLTO RESPIMAT) 2.5-2.5 MCG/ACT AERS Inhale 2 puffs into the lungs daily.  [name] albuterol (PROVENTIL) (2.5 MG/3ML) 0.083% nebulizer solution USE ONE AMPULE(3ML) VIA NEBULIZER EVERY 4 HOURS AS NEEDED FOR WHEEZING OR SHORTNESS OF BREATH (Patient not taking: Reported on 03/31/2020)  . Nintedanib (OFEV) 100 MG CAPS Take 1 capsule (100 mg total) by mouth 2 (two) times daily. (Patient not taking: Reported on 03/31/2020)   No facility-administered encounter medications on file as of 03/31/2020.   Physical Exam: Blood pressure 134/74, pulse 89, temperature (!) 97.3 F (36.3 C), temperature source Temporal, height 5\' 7"  (1.702 m), weight 217 lb 6.4 oz (98.6 kg), SpO2 97 %. Gen:      No acute distress HEENT:  EOMI, sclera anicteric Neck:     No masses; no thyromegaly Lungs:    Clear to auscultation bilaterally; normal respiratory effort CV:         Regular rate and rhythm; no murmurs Abd:      + bowel sounds; soft, non-tender; no palpable masses, no distension Ext:    No edema; adequate peripheral perfusion Skin:      Warm and dry; no rash Neuro: alert and oriented x 3 Psych: normal mood and affect  Data Reviewed: Imaging: CT chest 12/06/2011- peripheral and basal fibrotic changes, calcified pleural plaques.  CT chest 06/09/2015- peripheral and basal fibrotic changes with no honeycombing.  Calcified pleural plaques  CT high-resolution 05/30/2017- moderate centrilobular emphysema, mild basilar subpleural  reticulation, groundglass and traction bronchiectasis.  No honeycombing.  Calcified granuloma in the right upper lobe, calcified pleural plaques.  CT high-resolution 06/08/2018- moderate emphysema, mild basilar reticulation, traction bronchiectasis.  No honeycombing, calcified pleural plaques with right pleural thickening.  Stable calcified granuloma.  I have reviewed the images personally. I have reviewed the images personally.  CT chest high-resolution [[facility]] 01/22/2018 1. Primarily basilar peripheral interlobular septal thickening. Findings may represent changes of chronic edema vs early fibrosis. New small right and trace left pleural effusions with associated calcified pleural plaques. Correlate with history of asbestos exposure.  2. Apparent increased thickening of the right basilar pleura, incompletely assessed. Consider PET/CT or pleural fluid sampling for further evaluation if not already obtained. Differential considerations include fibrosis or malignancy such as mesothelioma. 3. Aortic valve calcifications.  4. Scattered less than 3 mm pulmonary nodules. If the patient is low risk for lung cancer, no further follow-up is recommended. If the patient is high risk for lung cancer, consider 12 month follow-up CT. (2017 Fleischner Guidelines)  PFTs: 09/25/2012 FVC  2.52 [69%], FEV1 1.95 [75%), F/F 77, TLC 72%, DLCO 70% Moderate obstructive airways, minimal restriction and diffusion defect.  06/12/2018 FVC 2.38 [16%], FEV1 1.66 [66%], F/F 70, TLC 3.95 (61%), DLCO 16.34 [73%] Moderate obstruction with restriction and minimal diffusion defect  Labs: ANA 01/22/2018- negative, CCP-negative  Pleural fluid 09/19/2017- LDH 462, total protein 3.9 WBC 3661, 59% eos, 27% lymphs, 10% neutrophils Cytology-mixed reactive and inflammatory cells.  No malignancy.  Hepatic panel 2/22-stable  Cardiac Echocardiogram 08/28/2017- LVEF 38-17%, grade 2 diastolic dysfunction, mild aortic stenosis.   Severe dilatation of left atrium and right atrium, trivial pericardial effusion.  Cardiac catheterization 09/18/2017 Significant three-vessel coronary artery disease with patent grafts.  Right heart catheterization shows mildly elevated filling pressures, minimal pulmonary hypertension Wedge 12, PA pressure 39/17 (24), cardiac output 5.08 L/min PVR 189, 2.4 Wood units  Sleep CPAP titration 11/04/2015- CPAP titrated to 12 cm of water.  Download 05/14/2018 Set pressure 12 cm of water 100% usage greater than 4 hours Residual AHI 1.2  Download 03/24/2019 Set pressure of 12 cm 100% usage greater than 4 hours, residual AHI 1.8  Assessment:  Pulmonary fibrosis, asbestosis CT scan reviewed with basilar fibrosis, pleural calcifications consistent with asbestos exposure Due to progression of pulmonary fibrosis he has been initiated on Ofev. But had to stop due to GI side effects  We will give him a break from medication and reassess with spirometry, diffusion capacity in 3 months He may need to go on alternate medication such as Esbriet.  Dyspnea is multifactorial from ILD, obesity, diastolic heart failure, coronary artery disease Suspect anxiety is a significant component to his episodes of dyspnea Will benefit from pulmonary rehab but he will like to hold off until the Covid pandemic has improved  Emphysema COPD PFTs reviewed with moderate obstruction.   Continue inhalers Continue flutter valve for mucociliary clearance  Right pleural effusion exudative, eosinophilic S.p thoracentesis dring hospitalization in 2019.  The cell count is eosinophilic which could be from pneumonia versus asbestos-related effusion Follow-up CT shows thickening of the pleura in that area We will need to monitor this area for possible mesothelioma.   Obstructive sleep apnea Stable on CPAP Download reviewed with good compliance and effectiveness.    Health maintenance 12/11/2018  -influenza 05/10/2016-Prevnar  Up-to-date with flu and COVID-19.  This appointment required 45 minutes of patient care (this includes precharting, chart review, review of results, face-to-face care, etc.).  Plan/Recommendations: Stop Ofev Stiolto, flutter valve Follow-up in 3 months with spirometry and diffusion capacity  Marshell Garfinkel MD [facility] 03/31/2020, 2:27 PM  CC: [name], MD

## 2020-03-31 NOTE — Patient Instructions (Addendum)
We keep you off Ofev since you have not tolerated it  We will give you a break from therapy for now Order spirometry and diffusion capacity in 3 months Follow-up in clinic after these tests.

## 2020-04-02 ENCOUNTER — Ambulatory Visit (INDEPENDENT_AMBULATORY_CARE_PROVIDER_SITE_OTHER): Payer: PPO

## 2020-04-02 ENCOUNTER — Other Ambulatory Visit: Payer: Self-pay

## 2020-04-02 DIAGNOSIS — E538 Deficiency of other specified B group vitamins: Secondary | ICD-10-CM

## 2020-04-02 MED ORDER — CYANOCOBALAMIN 1000 MCG/ML IJ SOLN
1000.0000 ug | Freq: Once | INTRAMUSCULAR | Status: AC
  Administered 2020-04-02: 1000 ug via INTRAMUSCULAR

## 2020-04-02 NOTE — Progress Notes (Signed)
Patient presented for B 12 injection to left deltoid, patient voiced no concerns nor showed any signs of distress during injection. 

## 2020-04-13 ENCOUNTER — Telehealth: Payer: Self-pay | Admitting: *Deleted

## 2020-04-13 NOTE — Telephone Encounter (Signed)
Wife called to cx appt 05/06/20. She is having knee surgery 04/29/20 and wants to come with husband to appt. Dr. Anne Hahn did not have any sooner appt. Rescheduled to 05/20/20 at 12pm with Dr. Anne Hahn.

## 2020-04-14 ENCOUNTER — Ambulatory Visit: Payer: PPO | Admitting: Neurology

## 2020-04-20 ENCOUNTER — Encounter: Payer: Self-pay | Admitting: Family Medicine

## 2020-04-20 ENCOUNTER — Telehealth: Payer: Self-pay | Admitting: Family Medicine

## 2020-04-20 ENCOUNTER — Other Ambulatory Visit: Payer: Self-pay

## 2020-04-20 ENCOUNTER — Ambulatory Visit
Admission: RE | Admit: 2020-04-20 | Discharge: 2020-04-20 | Disposition: A | Discharge status: Home / Self Care | Payer: HMO | Source: Ambulatory Visit | Attending: Family Medicine | Admitting: Family Medicine

## 2020-04-20 ENCOUNTER — Ambulatory Visit (INDEPENDENT_AMBULATORY_CARE_PROVIDER_SITE_OTHER): Payer: HMO | Admitting: Family Medicine

## 2020-04-20 ENCOUNTER — Ambulatory Visit: Payer: PPO | Admitting: Neurology

## 2020-04-20 ENCOUNTER — Telehealth: Payer: Self-pay | Admitting: Neurology

## 2020-04-20 VITALS — BP 116/80 | HR 80 | Temp 98.2°F | Ht 67.0 in | Wt 216.0 lb

## 2020-04-20 DIAGNOSIS — R4781 Slurred speech: Secondary | ICD-10-CM | POA: Diagnosis not present

## 2020-04-20 DIAGNOSIS — F32A Depression, unspecified: Secondary | ICD-10-CM | POA: Diagnosis not present

## 2020-04-20 DIAGNOSIS — G319 Degenerative disease of nervous system, unspecified: Secondary | ICD-10-CM | POA: Diagnosis not present

## 2020-04-20 DIAGNOSIS — G4710 Hypersomnia, unspecified: Secondary | ICD-10-CM | POA: Insufficient documentation

## 2020-04-20 DIAGNOSIS — S0990XA Unspecified injury of head, initial encounter: Secondary | ICD-10-CM

## 2020-04-20 DIAGNOSIS — G20 Parkinson's disease: Secondary | ICD-10-CM | POA: Diagnosis not present

## 2020-04-20 DIAGNOSIS — R81 Glycosuria: Secondary | ICD-10-CM

## 2020-04-20 DIAGNOSIS — F419 Anxiety disorder, unspecified: Secondary | ICD-10-CM

## 2020-04-20 DIAGNOSIS — G479 Sleep disorder, unspecified: Secondary | ICD-10-CM | POA: Diagnosis not present

## 2020-04-20 LAB — POCT URINALYSIS DIPSTICK
Blood, UA: NEGATIVE
Glucose, UA: POSITIVE — AB
Ketones, UA: 15
Leukocytes, UA: NEGATIVE
Nitrite, UA: NEGATIVE
Protein, UA: NEGATIVE
Spec Grav, UA: 1.025 (ref 1.010–1.025)
Urobilinogen, UA: 0.2 E.U./dL
pH, UA: 6 (ref 5.0–8.0)

## 2020-04-20 LAB — COMPREHENSIVE METABOLIC PANEL
ALT: 8 U/L (ref 0–53)
AST: 11 U/L (ref 0–37)
Albumin: 4.1 g/dL (ref 3.5–5.2)
Alkaline Phosphatase: 87 U/L (ref 39–117)
BUN: 18 mg/dL (ref 6–23)
CO2: 27 mEq/L (ref 19–32)
Calcium: 9.7 mg/dL (ref 8.4–10.5)
Chloride: 104 mEq/L (ref 96–112)
Creatinine, Ser: 0.84 mg/dL (ref 0.40–1.50)
GFR: 81.25 mL/min (ref >60.00)
Glucose, Bld: 96 mg/dL (ref 70–99)
Potassium: 3.8 mEq/L (ref 3.5–5.1)
Sodium: 141 mEq/L (ref 135–145)
Total Bilirubin: 0.9 mg/dL (ref 0.2–1.2)
Total Protein: 6.3 g/dL (ref 6.0–8.3)

## 2020-04-20 LAB — CBC
HCT: 48.9 % (ref 39.0–52.0)
Hemoglobin: 16 g/dL (ref 13.0–17.0)
MCHC: 32.8 g/dL (ref 30.0–36.0)
MCV: 88.9 fl (ref 78.0–100.0)
Platelets: 114 10*3/uL — ABNORMAL LOW (ref 150.0–400.0)
RBC: 5.51 Mil/uL (ref 4.22–5.81)
RDW: 16.4 % — ABNORMAL HIGH (ref 11.5–15.5)
WBC: 10.9 10*3/uL — ABNORMAL HIGH (ref 4.0–10.5)

## 2020-04-20 NOTE — Addendum Note (Signed)
Addended by: Glori Luis on: 04/20/2020 04:29 PM   Modules accepted: Orders

## 2020-04-20 NOTE — Progress Notes (Addendum)
Andre Rumps, MD Phone: [phone]  Andre [name] is a 85 y.o. male who presents today for f/u.  Fall/head injury: The patient and his wife present today.  She notes has had a couple of falls in the past week or so.  He felt 7 days ago when he slipped off the bed and hit his right forehead.  There is a bruise in this area.  He has had some intermittent headaches since then.  No loss of consciousness.  They note his speech has been slurred intermittently for several weeks.  He notes has been sleeping more than usual though that has been going on for several weeks as well.  He denies numbness or weakness.  They deny any fevers, cough, or congestion.  He does have Parkinson's.  They report at times he has seen bugs crawling across doors that do not appear to be there.  This is only occurred on 3 occasions.  They are trying to get him in to see his neurologist sooner.  He does report some depression that is chronic.  He is on Wellbutrin and Zoloft.  No SI.  Social History   Tobacco Use  Smoking Status Former Smoker  . Packs/day: 1.00  . Years: 10.00  . Pack years: 10.00  . Types: Cigarettes, Pipe, Cigars  . Quit date: 04/18/1972  . Years since quitting: 48.0  Smokeless Tobacco Former Systems developer  . Types: Chew  . Quit date: 04/18/1972     ROS see history of present illness  Objective  Physical Exam Vitals:   04/20/20 1344  BP: 116/80  Pulse: 80  Temp: 98.2 F (36.8 C)  SpO2: 96%    BP Readings from Last 3 Encounters:  04/20/20 116/80  03/31/20 134/74  03/03/20 118/70   Wt Readings from Last 3 Encounters:  04/20/20 216 lb (98 kg)  03/31/20 217 lb 6.4 oz (98.6 kg)  03/03/20 219 lb 12.8 oz (99.7 kg)    Physical Exam Constitutional:      General: He is not in acute distress.    Appearance: He is not diaphoretic.  HENT:     Head:     Comments: Bruise on right forehead, negative battle sign, negative raccoon eyes    Ears:     Comments: TMs bilaterally obscured by cerumen,  ears were irrigated by CMA and the patient tolerated this well, this revealed normal TMs bilaterally Cardiovascular:     Rate and Rhythm: Normal rate and regular rhythm.     Heart sounds: Normal heart sounds.  Pulmonary:     Effort: Pulmonary effort is normal.     Breath sounds: Normal breath sounds.  Musculoskeletal:        General: No edema.  Skin:    General: Skin is warm and dry.  Neurological:     Mental Status: He is alert.     Comments: EOMI, PERRL, hearing intact to finger rub, sensation light touch intact V1 through V3 bilaterally, shoulder shrug intact, speech does sound slurred, smile intact, tongue midline, 5/5 strength in bilateral biceps, triceps, grip, quads, hamstrings, plantar and dorsiflexion, sensation to light touch intact in bilateral UE and LE, normal gait      Assessment/Plan: Please see individual problem list.  Problem List Items Addressed This Visit    Anxiety and depression    Continues with depression.  We will continue with his Wellbutrin 150 mg twice daily and Zoloft 50 mg daily for the time being as we work-up these other issues.  Hypersomnia    Possibly related to underlying depression versus his head injury versus previous stroke versus metabolic cause.  MRI ordered to evaluate for stroke and head injury issues.  Lab work as outlined to evaluate for other underlying causes.      Relevant Orders   CBC   Comp Met (CMET)   POCT Urinalysis Dipstick   TSH   Parkinson's disease (HCC) (Chronic)    I discussed that the possible visual hallucinations could be related to his Parkinson's.  He is not actively having any issues with this.  I advised that they need to follow-up with his neurologist regarding this.      Slurred speech - Primary    Concern for possible prior stroke.  He also had head injury recently which is concerning given that he is on a anticoagulant.  Discussed the need for imaging.  Given concern for possible prior stroke with the  slurred speech we will obtain an MRI.  They were advised to seek medical attention in the emergency department if his symptoms worsen at all.  MRI ordered stat.      Relevant Orders   MR Brain Wo Contrast    Other Visit Diagnoses    Injury of head, initial encounter       Relevant Orders   MR Brain Wo Contrast      This visit occurred during the SARS-CoV-2 public health emergency.  Safety protocols were in place, including screening questions prior to the visit, additional usage of staff PPE, and extensive cleaning of exam room while observing appropriate contact time as indicated for disinfecting solutions.    Andre Rumps, MD [place]

## 2020-04-20 NOTE — Assessment & Plan Note (Signed)
Concern for possible prior stroke.  He also had head injury recently which is concerning given that he is on a anticoagulant.  Discussed the need for imaging.  Given concern for possible prior stroke with the slurred speech we will obtain an MRI.  They were advised to seek medical attention in the emergency department if his symptoms worsen at all.  MRI ordered stat.

## 2020-04-20 NOTE — Assessment & Plan Note (Signed)
Continues with depression.  We will continue with his Wellbutrin 150 mg twice daily and Zoloft 50 mg daily for the time being as we work-up these other issues.

## 2020-04-20 NOTE — Telephone Encounter (Signed)
Pt called and wanted Dr. Birdie Sons to observer pt behavior today at his appt she thinks that he is having yearly signs of dementia   And when it is brought up he gets upset

## 2020-04-20 NOTE — Telephone Encounter (Signed)
Noted. Will plan to discuss during his visit.

## 2020-04-20 NOTE — Progress Notes (Signed)
Patient presented for ear irrigation due to cerumen impaction. Patient was informed of the possible side effects of having their ear flushed; light headedness, dizziness, nausea, vomiting and rupture ear drum. Items sed or that can be used are the elephant pump, catch basin, ear curettes, hydrogen peroxide (half a bottle for ear irrigation solution), and a stool softener (1-2CC for softening ear wax). Patient has given a verbal consent to have ear irrigation. patient voiced no concerns nor showed any signs of distress during procedure. Ear canal has become visibly clear  

## 2020-04-20 NOTE — Telephone Encounter (Signed)
[name] calling in. We see both her and her husband today in the afternoon. She would like her husband observed at this time for early onset dementia. States he gets upset when she brings it up in front of him.   For your information

## 2020-04-20 NOTE — Patient Instructions (Addendum)
Nice to see. We will get imaging of your head.  If you develop worsening headache, numbness, weakness, worsening speech issues, worsening drowsiness, or any new or changing symptoms please seek medical attention in the emergency department. Also contact you with your lab results. Please go to the medical mall at the hospital by 3:30 PM to have your MRI completed.

## 2020-04-20 NOTE — Addendum Note (Signed)
Addended by: Hulan Fray on: 04/20/2020 02:50 PM   Modules accepted: Orders

## 2020-04-20 NOTE — Assessment & Plan Note (Signed)
Possibly related to underlying depression versus his head injury versus previous stroke versus metabolic cause.  MRI ordered to evaluate for stroke and head injury issues.  Lab work as outlined to evaluate for other underlying causes.

## 2020-04-20 NOTE — Telephone Encounter (Signed)
Pt.'s wife Andre [name] Bible is on [place]. She states husband is falling, showing signs of dementia, slurring words & she is wondering if he can be seen sooner before her surgery 04/29/20. Please advise.

## 2020-04-20 NOTE — Telephone Encounter (Signed)
Called and spoke to [name] on [place]), she stated that they are on the way to his PCP and will call back if she needs an appointment sooner.

## 2020-04-20 NOTE — Assessment & Plan Note (Signed)
I discussed that the possible visual hallucinations could be related to his Parkinson's.  He is not actively having any issues with this.  I advised that they need to follow-up with his neurologist regarding this.

## 2020-04-20 NOTE — Addendum Note (Signed)
Addended by: Glori Luis on: 04/20/2020 02:34 PM   Modules accepted: Orders

## 2020-04-21 ENCOUNTER — Telehealth: Payer: Self-pay | Admitting: Neurology

## 2020-04-21 ENCOUNTER — Telehealth: Payer: Self-pay

## 2020-04-21 ENCOUNTER — Other Ambulatory Visit (INDEPENDENT_AMBULATORY_CARE_PROVIDER_SITE_OTHER): Payer: HMO

## 2020-04-21 DIAGNOSIS — R81 Glycosuria: Secondary | ICD-10-CM

## 2020-04-21 LAB — HEMOGLOBIN A1C: Hgb A1c MFr Bld: 5.7 % (ref 4.6–6.5)

## 2020-04-21 LAB — TSH: TSH: 5.33 u[IU]/mL — ABNORMAL HIGH (ref 0.35–4.50)

## 2020-04-21 NOTE — Telephone Encounter (Signed)
Patients wife aware of results and will call neurologist for follow up

## 2020-04-21 NOTE — Telephone Encounter (Signed)
I called and talk with the wife.  Over the last week he has had increased confusion, drowsiness, deterioration in gait.  MRI of the brain that was just done shows diffuse cortical atrophy, no acute changes were seen, incidental small meningioma noted.  The only recent medication changes were in addition of Zoloft to Wellbutrin in November.  I'll need to get the patient worked in sometime this week if possible.  Blood work and urinalysis have been done, no obvious abnormalities noted.  The patient does have a low B12 level but is on B12 shots.

## 2020-04-21 NOTE — Telephone Encounter (Signed)
Please let the patient's wife know that his MRI did not reveal any bleeding.  It did reveal moderate cerebral atrophy which should be discussed with his neurologist.  There was also an incidental meningioma.  This is a noncancerous lesion that can grow out of the lining of the brain.  There was no significant effect on the underlying brain tissue.  They could follow-up with his neurologist for further monitoring of this area.

## 2020-04-21 NOTE — Telephone Encounter (Signed)
Called patient's wife back, she stated that she wanted Dr. Anne Hahn to go ahead and prescribe something to help him, explained that we are trying to get her worked in as early as possible and he would need to see him before prescribing any medications.  She said he sleeps fine, just wants him to not be so hard to deal with and confused.   Assured her I would pass this on to Dr. Anne Hahn for review.

## 2020-04-21 NOTE — Telephone Encounter (Signed)
Called and spoke to patient's wife.  Dr. Anne Hahn is okay with patient being seen by Maralyn Sago, however there are no appointments this week for either one of them.   Told patient she is on the call list and I will call her for first available appointment.

## 2020-04-21 NOTE — Telephone Encounter (Signed)
Andre [name] with [facility] radiology called to report MRI of brain. Please see results through Epic-STAT

## 2020-04-21 NOTE — Telephone Encounter (Signed)
Frances Nickels returned your call for results from MRI for patient.

## 2020-04-21 NOTE — Telephone Encounter (Signed)
Pt's wife called, would like to know if Dr. Anne Hahn can prescribe something for him. Would like a call from the nurse.

## 2020-04-21 NOTE — Telephone Encounter (Signed)
Patient wife called and she is worried and she cant understand what he is saying to her . Please call I need help .  Patient is fussing a lot . [name] was crying on the phone . [phone] . Patient wife  Stated she just needs some help . Patient's daughter will be coming but not sure when.  I asked her if she needed to call 911 she stated she didn't . Dr. Anne Hahn see PCP notes .

## 2020-04-23 ENCOUNTER — Encounter: Payer: Self-pay | Admitting: Neurology

## 2020-04-23 ENCOUNTER — Other Ambulatory Visit: Payer: Self-pay

## 2020-04-23 ENCOUNTER — Ambulatory Visit (INDEPENDENT_AMBULATORY_CARE_PROVIDER_SITE_OTHER): Payer: HMO | Admitting: Neurology

## 2020-04-23 VITALS — BP 141/89 | HR 80 | Ht 67.0 in | Wt 216.0 lb

## 2020-04-23 DIAGNOSIS — G20 Parkinson's disease: Secondary | ICD-10-CM | POA: Diagnosis not present

## 2020-04-23 NOTE — Progress Notes (Signed)
Reason for visit: Parkinson's disease  Andre [name] is an 85 y.o. male  History of present illness:  Andre [name] is an 85 year old right-handed white male with a history of Parkinson's disease.  The patient also has pulmonary fibrosis, sleep apnea on CPAP, and congestive heart failure.  He has had chronic issues with fatigue, he has very limited physical activity.  He has had some changes in his functional level since last seen.  Over the last several weeks he has had increasing problems with gait instability, he has fallen on occasion and hit his head, a recent MRI of the brain did not show any acute intracranial abnormalities.  The patient spends most of his day sleeping.  He claims that he has been following up with his pulmonologist and that his CPAP machine is adequate to treat his sleep apnea.  He has begun having some troubles with hallucinations recently.  This has not resulted in severe agitation.  He was found to have a low B12 level and he is on B12 injections now.  He does not use a cane or walker for ambulation.  The patient reports no headaches or significant dizziness.  He does get short of breath with minimal physical activity.  The patient comes in today for an evaluation.  Past Medical History:  Diagnosis Date  . Atherosclerosis of abdominal aorta ([place])   . Basal cell carcinoma 03/04/2008   Right nose supratip.   [name] CAD (coronary artery disease)   . Cervical spondylosis 10/01/2013  . Chronic diastolic CHF (congestive heart failure) ([place])    a. 07/2016 Echo: >55%; b. 10/2016 Echo: EF 55-60%, Gr1 DD, Ao sclerosis w/o stenosis, sev dil LA; c. 08/2017 Echo: EF 60-65%, no rwma, Gr2 DD, mild AS, sev dil LA/RA.  [name] Coronary artery disease    a. 1998 s/p mini-cabg @ [facility] - LIMA->LAD;  b. 07/2016 St Echo: Inadequate HR w/ HTN response;  c.  08/2016 MV: EF 67%, no ischemia; d. 10/2016 NSTEMI/Cath: RCA 95p (4.0x26 Onyx DES), LIMA->LAD nl; e. 09/2017 Cath: LM 40/30, LAD 100ost, RI 80, LCX nl,  OM2/3 nl, RCA patent stent, 57m, LIMA->LAD nl-->Med Rx.  . DDD (degenerative disc disease), cervical   . DDD (degenerative disc disease), lumbar   . Depression   . Gait abnormality 07/31/2019  . GERD (gastroesophageal reflux disease)   . Hyperlipidemia   . Hypertension   . Hypothyroidism   . PAF (paroxysmal atrial fibrillation) ([facility])    a. s/p DCCV-->maintaining sinus on amiodarone;  b. CHA2DS2VASc = 5-->eliquis.  . Parkinson's disease ([place])    tremors  . Pleural effusion, right    a. 09/2017 s/p thoracentesis.  [name] PNA (pneumonia) 08/26/2017  . Pulmonary embolism ([place]) 2011  . Pulmonary fibrosis ([place])   . 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 Sleep apnea    wears CPAP  . Squamous cell carcinoma of skin 03/19/2015   Right lateral crown. KA-like pattern  . Thrombocytopenia ([place])     Past Surgical History:  Procedure Laterality Date  . BACK SURGERY  1960  . CARDIAC CATHETERIZATION    . CHOLECYSTECTOMY  2010  . COLONOSCOPY WITH PROPOFOL N/A 06/07/2018   Procedure: COLONOSCOPY WITH PROPOFOL;  Surgeon: Lollie Sails, MD;  Location: [facility] ENDOSCOPY;  Service: Endoscopy;  Laterality: N/A;  . CORONARY ARTERY BYPASS GRAFT  01/07/1997  . CORONARY STENT INTERVENTION N/A 10/31/2016   Procedure: Coronary Stent Intervention;  Surgeon: Wellington Hampshire, MD;  Location: [place] [facility] CV LAB;  Service: Cardiovascular;  Laterality: N/A;  . ELECTROPHYSIOLOGIC STUDY N/A 07/14/2015   Procedure: CARDIOVERSION;  Surgeon: Alwyn Pea, MD;  Location: [facility] ORS;  Service: Cardiovascular;  Laterality: N/A;  . ELECTROPHYSIOLOGIC STUDY N/A 10/12/2015   Procedure: CARDIOVERSION;  Surgeon: Antonieta Iba, MD;  Location: [facility] ORS;  Service: Cardiovascular;  Laterality: N/A;  . LEFT HEART CATH AND CORONARY ANGIOGRAPHY N/A 10/31/2016   Procedure: Left Heart Cath and Coronary Angiography;  Surgeon: Iran Ouch, MD;  Location: [facility] INVASIVE CV LAB;  Service: Cardiovascular;  Laterality: N/A;  . OTHER  SURGICAL HISTORY  1998   Bypass  . RIGHT/LEFT HEART CATH AND CORONARY ANGIOGRAPHY N/A 09/18/2017   Procedure: RIGHT/LEFT HEART CATH AND CORONARY ANGIOGRAPHY;  Surgeon: Iran Ouch, MD;  Location: [facility] INVASIVE CV LAB;  Service: Cardiovascular;  Laterality: N/A;    Family History  Problem Relation Age of Onset  . Alcohol abuse Father     Social history:  reports that he quit smoking about 48 years ago. His smoking use included cigarettes, pipe, and cigars. He has a 10.00 pack-year smoking history. He quit smokeless tobacco use about 48 years ago.  His smokeless tobacco use included chew. He reports current alcohol use of about 4.0 standard drinks of alcohol per week. He reports that he does not use drugs.    Allergies  Allergen Reactions  . Pravastatin Other (See Comments)  . Prednisone Other (See Comments)    Pt states that med makes him hyper Pt states that med makes him hyper    Medications:  Prior to Admission medications   Medication Sig Start Date End Date Taking? Authorizing Provider  albuterol (PROVENTIL) (2.5 MG/3ML) 0.083% nebulizer solution USE ONE AMPULE(3ML) VIA NEBULIZER EVERY 4 HOURS AS NEEDED FOR WHEEZING OR SHORTNESS OF BREATH 05/09/19  Yes Mannam, Praveen, MD  buPROPion (WELLBUTRIN SR) 150 MG 12 hr tablet Take 1 tablet (150 mg total) by mouth 2 (two) times daily. 03/03/20  Yes Glori Luis, MD  carbidopa-levodopa (SINEMET IR) 25-250 MG tablet TAKE TWO TABLETS 3 TIMES DAILY 12/24/19  Yes York Spaniel, MD  carvedilol (COREG) 3.125 MG tablet TAKE TWO TABLETS TWICE A DAY WITH MEALS 12/20/19  Yes Iran Ouch, MD  ELIQUIS 5 MG TABS tablet TAKE ONE TABLET BY MOUTH TWICE DAILY 02/10/20  Yes Iran Ouch, MD  entacapone (COMTAN) 200 MG tablet Take 1 tablet (200 mg total) by mouth 3 (three) times daily. 06/27/19  Yes York Spaniel, MD  esomeprazole (NEXIUM) 40 MG capsule TAKE 1 CAPSULE BY MOUTH ONCE DAILY 02/03/20  Yes Glori Luis, MD  fluticasone  [facility]) 50 MCG/ACT nasal spray USE 2 PUFFS IN [facility] NOSTRIL DAILY 09/05/19  Yes Glori Luis, MD  furosemide (LASIX) 20 MG tablet Take 40 mg by mouth daily.   Yes [provider]  HYDROcodone-acetaminophen (NORCO/VICODIN) 5-325 MG tablet Take 1 tablet by mouth every 6 (six) hours as needed for moderate pain or severe pain. 11/23/19  Yes Domenick Gong, MD  hydrocortisone 2.5 % lotion Apply topically daily. Apply to affected areas 4 times per week. 02/05/20  Yes Deirdre Evener, MD  ketoconazole (NIZORAL) 2 % cream  01/01/18  Yes [provider]  ketoconazole (NIZORAL) 2 % shampoo Apply 1 application topically 3 (three) times a week. Wash scalp, face and ears 02/05/20  Yes Deirdre Evener, MD  ketorolac [facility]) 0.5 % ophthalmic solution SMARTSIG:1 Drop(s) In Eye(s) Every 4-6 Hours PRN 06/19/19  Yes [provider]  lactulose (CHRONULAC)  10 GM/15ML solution Take 30 mLs (20 g total) by mouth 2 (two) times daily as needed for moderate constipation. 03/18/20  Yes Crecencio Mc, MD  levothyroxine (SYNTHROID) 50 MCG tablet TAKE ONE TABLET ON AN EMPTY STOMACH WITHA GLASS OF WATER AT LEAST 30 TO 60 MINUTES BEFORE BREAKFAST 02/04/20  Yes [name], MD  losartan (COZAAR) 100 MG tablet TAKE 1 TABLET BY MOUTH DAILY 11/05/19  Yes [name], MD  mometasone (ELOCON) 0.1 % cream  01/01/18  Yes [provider]  mupirocin ointment (BACTROBAN) 2 % Apply 1 application topically 3 (three) times daily. Patient taking differently: Apply 1 application topically 3 (three) times daily as needed. 10/30/19  Yes McLean-Scocuzza, Nino Glow, MD  potassium chloride SA (KLOR-CON) 10 MEQ tablet Take 1 tablet (10 mEq total) by mouth daily. 12/12/19  Yes Wellington Hampshire, MD  PROAIR HFA 108 [phone] Base) MCG/ACT inhaler INHALE 2 PUFFS INTO THE LUNGS EVERY SIX HOURS AS NEEDED FOR WHEEZING OR SHORTNESS OF BREATH 10/08/19  Yes Martyn Ehrich, NP  Respiratory Therapy Supplies  (FLUTTER) DEVI 1 Device by Does not apply route daily. 05/29/19  Yes Martyn Ehrich, NP  rosuvastatin (CRESTOR) 10 MG tablet TAKE 1 TABLET BY MOUTH DAILY 02/18/20  Yes Wellington Hampshire, MD  sertraline (ZOLOFT) 50 MG tablet Take 1 tablet (50 mg total) by mouth daily. 03/03/20  Yes [name], MD  Tiotropium Bromide-Olodaterol (STIOLTO RESPIMAT) 2.5-2.5 MCG/ACT AERS Inhale 2 puffs into the lungs daily. 10/08/19  Yes Mannam, Praveen, MD    ROS:  Out of a complete 14 system review of symptoms, the patient complains only of the following symptoms, and all other reviewed systems are negative.  Walking difficulty Daytime drowsiness Fatigue  Blood pressure (!) 141/89, pulse 80, height 5\' 7"  (1.702 m), weight 216 lb (98 kg).  Physical Exam  General: The patient is alert and cooperative at the time of the examination.  The patient is moderately obese.  Skin: No significant peripheral edema is noted.   Neurologic Exam  Mental status: The patient is alert and oriented x 3 at the time of the examination. The Mini-Mental status examination done today shows a total score 25/30.   Cranial nerves: Facial symmetry is present. Speech is normal, no aphasia or dysarthria is noted. Extraocular movements are full. Visual fields are full.  Motor: The patient has good strength in all 4 extremities.  Sensory examination: Soft touch sensation is symmetric on the face, arms, and legs.  Coordination: The patient has good finger-nose-finger and heel-to-shin bilaterally.  The patient has some mild dyskinesias.  Gait and station: The patient has a slightly wide-based gait, he has some gait instability but is able to walk independently.  He is able to rise from a seated position with arms crossed. Romberg is negative. No drift is seen.  Reflexes: Deep tendon reflexes are symmetric.   MRI brain 04/20/20:  IMPRESSION: Mildly motion degraded examination.  No evidence of acute or recent subacute  infarction.  No acute posttraumatic intracranial findings.  1.3 x 0.4 cm dural-based mass overlying the right temporal lobe, likely reflecting a small incidental meningioma. No significant mass effect.  Moderate cerebral atrophy and chronic small vessel ischemic disease.  Small right maxillary sinus mucous retention cyst. * MRI scan images were reviewed online. I agree with the written report.    Assessment/Plan:  1.  Parkinson's disease  2.  Gait disorder  3.  Hallucinations  4.  Sleep apnea on  CPAP  5.  Chronic daytime drowsiness  6.  Pulmonary fibrosis  It is possible the patient could be developing a memory disorder.  He is now having some problems with visual hallucinations, there have been no recent changes in his Parkinson's medications.  The patient is very inactive during the day, he has a lot of fatigue and daytime drowsiness.  He does have sleep apnea.  The patient will be sent for physical therapy for gait training and to increase his physical activity.  We will check a memory test today and follow the memory issues over time.  He will follow up in 3 months.  Jill Alexanders MD 04/23/2020 1:54 PM  [facility] [address] [place], [place] [address]  Phone [phone] Fax [phone]

## 2020-04-24 ENCOUNTER — Telehealth: Payer: Self-pay | Admitting: Pulmonary Disease

## 2020-04-24 NOTE — Telephone Encounter (Signed)
Lm for patient's spouse, Patricia(dpr)

## 2020-04-27 NOTE — Telephone Encounter (Signed)
Spoke with pt's spouse  She states pt's neurologist was asking about CPAP use and whether or not pt has had his compliance checked lately  I advised will print DL for Dr Vaughan Browner to review  DL printed from [place] and placed in Dr Matilde Bash looakt folder in A pod  Thank you!

## 2020-04-29 ENCOUNTER — Telehealth: Payer: Self-pay | Admitting: Neurology

## 2020-04-29 NOTE — Telephone Encounter (Signed)
Pt.'s wife [name] is on [place]. She states husband won't eat & keeps falling. She's asking should she be concerned. Please advise.

## 2020-04-29 NOTE — Telephone Encounter (Signed)
Called patient's wife and patient has not started any kind of therapy, she has not heard from anybody to come out and he is sleeping all the time.  Wife is asking if this is normal for patient and is this what she should expect for his stage of Parkinson?  She is asking what stage is he in?  She is worried if he keeps falling she can't get him up.  She really doesn't know what to do with him.  She is pushing the protein drinks and letting him eat whatever he will eat versus not eating anything.  Suggested a couple different website and organizations to call for support for herself as well as her husband.  Wife is concerned, doesn't know what to do with him lately.

## 2020-04-30 ENCOUNTER — Other Ambulatory Visit: Payer: Self-pay

## 2020-04-30 ENCOUNTER — Inpatient Hospital Stay
Admission: EM | Admit: 2020-04-30 | Discharge: 2020-05-07 | DRG: 641 | Disposition: A | Discharge status: Skilled Nursing Facility | Payer: HMO | Attending: Family Medicine | Admitting: Family Medicine

## 2020-04-30 ENCOUNTER — Emergency Department: Payer: HMO

## 2020-04-30 ENCOUNTER — Telehealth: Payer: Self-pay | Admitting: Family Medicine

## 2020-04-30 DIAGNOSIS — D72829 Elevated white blood cell count, unspecified: Secondary | ICD-10-CM | POA: Diagnosis present

## 2020-04-30 DIAGNOSIS — Z9049 Acquired absence of other specified parts of digestive tract: Secondary | ICD-10-CM

## 2020-04-30 DIAGNOSIS — Z85828 Personal history of other malignant neoplasm of skin: Secondary | ICD-10-CM | POA: Diagnosis not present

## 2020-04-30 DIAGNOSIS — G20A1 Parkinson's disease without dyskinesia, without mention of fluctuations: Secondary | ICD-10-CM | POA: Diagnosis present

## 2020-04-30 DIAGNOSIS — E785 Hyperlipidemia, unspecified: Secondary | ICD-10-CM | POA: Diagnosis present

## 2020-04-30 DIAGNOSIS — E860 Dehydration: Secondary | ICD-10-CM | POA: Diagnosis present

## 2020-04-30 DIAGNOSIS — W19XXXA Unspecified fall, initial encounter: Secondary | ICD-10-CM | POA: Diagnosis present

## 2020-04-30 DIAGNOSIS — G20 Parkinson's disease: Secondary | ICD-10-CM | POA: Diagnosis present

## 2020-04-30 DIAGNOSIS — I480 Paroxysmal atrial fibrillation: Secondary | ICD-10-CM | POA: Diagnosis present

## 2020-04-30 DIAGNOSIS — I110 Hypertensive heart disease with heart failure: Secondary | ICD-10-CM | POA: Diagnosis present

## 2020-04-30 DIAGNOSIS — Z888 Allergy status to other drugs, medicaments and biological substances status: Secondary | ICD-10-CM

## 2020-04-30 DIAGNOSIS — J8410 Pulmonary fibrosis, unspecified: Secondary | ICD-10-CM | POA: Diagnosis present

## 2020-04-30 DIAGNOSIS — Z20822 Contact with and (suspected) exposure to covid-19: Secondary | ICD-10-CM | POA: Diagnosis present

## 2020-04-30 DIAGNOSIS — Z8701 Personal history of pneumonia (recurrent): Secondary | ICD-10-CM

## 2020-04-30 DIAGNOSIS — E039 Hypothyroidism, unspecified: Secondary | ICD-10-CM | POA: Diagnosis present

## 2020-04-30 DIAGNOSIS — K219 Gastro-esophageal reflux disease without esophagitis: Secondary | ICD-10-CM | POA: Diagnosis present

## 2020-04-30 DIAGNOSIS — Z951 Presence of aortocoronary bypass graft: Secondary | ICD-10-CM | POA: Diagnosis not present

## 2020-04-30 DIAGNOSIS — D696 Thrombocytopenia, unspecified: Secondary | ICD-10-CM | POA: Diagnosis present

## 2020-04-30 DIAGNOSIS — I517 Cardiomegaly: Secondary | ICD-10-CM | POA: Diagnosis not present

## 2020-04-30 DIAGNOSIS — J449 Chronic obstructive pulmonary disease, unspecified: Secondary | ICD-10-CM | POA: Diagnosis present

## 2020-04-30 DIAGNOSIS — R5381 Other malaise: Secondary | ICD-10-CM | POA: Diagnosis present

## 2020-04-30 DIAGNOSIS — F0280 Dementia in other diseases classified elsewhere without behavioral disturbance: Secondary | ICD-10-CM | POA: Diagnosis present

## 2020-04-30 DIAGNOSIS — Z79890 Hormone replacement therapy (postmenopausal): Secondary | ICD-10-CM

## 2020-04-30 DIAGNOSIS — I10 Essential (primary) hypertension: Secondary | ICD-10-CM | POA: Diagnosis not present

## 2020-04-30 DIAGNOSIS — I25119 Atherosclerotic heart disease of native coronary artery with unspecified angina pectoris: Secondary | ICD-10-CM | POA: Diagnosis present

## 2020-04-30 DIAGNOSIS — K5900 Constipation, unspecified: Secondary | ICD-10-CM | POA: Diagnosis present

## 2020-04-30 DIAGNOSIS — I5030 Unspecified diastolic (congestive) heart failure: Secondary | ICD-10-CM | POA: Diagnosis present

## 2020-04-30 DIAGNOSIS — F32A Depression, unspecified: Secondary | ICD-10-CM | POA: Diagnosis present

## 2020-04-30 DIAGNOSIS — F419 Anxiety disorder, unspecified: Secondary | ICD-10-CM | POA: Diagnosis present

## 2020-04-30 DIAGNOSIS — Z7901 Long term (current) use of anticoagulants: Secondary | ICD-10-CM

## 2020-04-30 DIAGNOSIS — G4733 Obstructive sleep apnea (adult) (pediatric): Secondary | ICD-10-CM | POA: Diagnosis present

## 2020-04-30 DIAGNOSIS — Z79899 Other long term (current) drug therapy: Secondary | ICD-10-CM

## 2020-04-30 DIAGNOSIS — E876 Hypokalemia: Secondary | ICD-10-CM | POA: Diagnosis present

## 2020-04-30 DIAGNOSIS — R4182 Altered mental status, unspecified: Secondary | ICD-10-CM | POA: Diagnosis not present

## 2020-04-30 DIAGNOSIS — Z86711 Personal history of pulmonary embolism: Secondary | ICD-10-CM

## 2020-04-30 DIAGNOSIS — R296 Repeated falls: Secondary | ICD-10-CM | POA: Diagnosis present

## 2020-04-30 DIAGNOSIS — R531 Weakness: Secondary | ICD-10-CM

## 2020-04-30 DIAGNOSIS — Z87891 Personal history of nicotine dependence: Secondary | ICD-10-CM

## 2020-04-30 DIAGNOSIS — I5032 Chronic diastolic (congestive) heart failure: Secondary | ICD-10-CM | POA: Diagnosis present

## 2020-04-30 DIAGNOSIS — R4781 Slurred speech: Secondary | ICD-10-CM | POA: Diagnosis not present

## 2020-04-30 DIAGNOSIS — R29810 Facial weakness: Secondary | ICD-10-CM | POA: Diagnosis not present

## 2020-04-30 DIAGNOSIS — R404 Transient alteration of awareness: Secondary | ICD-10-CM | POA: Diagnosis not present

## 2020-04-30 LAB — CBC
HCT: 50.9 % (ref 39.0–52.0)
Hemoglobin: 16.7 g/dL (ref 13.0–17.0)
MCH: 29.1 pg (ref 26.0–34.0)
MCHC: 32.8 g/dL (ref 30.0–36.0)
MCV: 88.8 fL (ref 80.0–100.0)
Platelets: 164 10*3/uL (ref 150–400)
RBC: 5.73 MIL/uL (ref 4.22–5.81)
RDW: 15.4 % (ref 11.5–15.5)
WBC: 12.7 10*3/uL — ABNORMAL HIGH (ref 4.0–10.5)
nRBC: 0 % (ref 0.0–0.2)

## 2020-04-30 LAB — BASIC METABOLIC PANEL
Anion gap: 14 (ref 5–15)
BUN: 21 mg/dL (ref 8–23)
CO2: 26 mmol/L (ref 22–32)
Calcium: 9.4 mg/dL (ref 8.9–10.3)
Chloride: 97 mmol/L — ABNORMAL LOW (ref 98–111)
Creatinine, Ser: 0.96 mg/dL (ref 0.61–1.24)
GFR, Estimated: >60 mL/min (ref >60)
Glucose, Bld: 93 mg/dL (ref 70–99)
Potassium: 3.5 mmol/L (ref 3.5–5.1)
Sodium: 137 mmol/L (ref 135–145)

## 2020-04-30 LAB — URINALYSIS, COMPLETE (UACMP) WITH MICROSCOPIC
Bacteria, UA: NONE SEEN
Bilirubin Urine: NEGATIVE
Glucose, UA: NEGATIVE mg/dL
Hgb urine dipstick: NEGATIVE
Ketones, ur: 5 mg/dL — AB
Leukocytes,Ua: NEGATIVE
Nitrite: NEGATIVE
Protein, ur: NEGATIVE mg/dL
Specific Gravity, Urine: 1.023 (ref 1.005–1.030)
pH: 5 (ref 5.0–8.0)

## 2020-04-30 LAB — CK: Total CK: 173 U/L (ref 49–397)

## 2020-04-30 LAB — TROPONIN I (HIGH SENSITIVITY)
Troponin I (High Sensitivity): 6 ng/L (ref <18)
Troponin I (High Sensitivity): 6 ng/L (ref <18)

## 2020-04-30 LAB — TSH: TSH: 4.325 u[IU]/mL (ref 0.350–4.500)

## 2020-04-30 LAB — T4, FREE: Free T4: 1.1 ng/dL (ref 0.61–1.12)

## 2020-04-30 LAB — BRAIN NATRIURETIC PEPTIDE: B Natriuretic Peptide: 154 pg/mL — ABNORMAL HIGH (ref 0.0–100.0)

## 2020-04-30 MED ORDER — LACTULOSE 10 GM/15ML PO SOLN
20.0000 g | Freq: Two times a day (BID) | ORAL | Status: DC | PRN
  Filled 2020-04-30: qty 30

## 2020-04-30 MED ORDER — SODIUM CHLORIDE 0.9 % IV SOLN: INTRAVENOUS | Status: DC

## 2020-04-30 MED ORDER — SERTRALINE HCL 50 MG PO TABS
50.0000 mg | ORAL_TABLET | Freq: Every day | ORAL | Status: DC
  Administered 2020-05-01 – 2020-05-07 (×7): 50 mg via ORAL
  Filled 2020-04-30 (×7): qty 1

## 2020-04-30 MED ORDER — ALBUTEROL SULFATE (2.5 MG/3ML) 0.083% IN NEBU: 2.5000 mg | INHALATION_SOLUTION | RESPIRATORY_TRACT | Status: DC | PRN

## 2020-04-30 MED ORDER — LEVOTHYROXINE SODIUM 50 MCG PO TABS
50.0000 ug | ORAL_TABLET | Freq: Every day | ORAL | Status: DC
  Administered 2020-05-01 – 2020-05-07 (×6): 50 ug via ORAL
  Filled 2020-04-30 (×6): qty 1

## 2020-04-30 MED ORDER — PANTOPRAZOLE SODIUM 40 MG PO TBEC
40.0000 mg | DELAYED_RELEASE_TABLET | Freq: Every day | ORAL | Status: DC
  Administered 2020-05-01 – 2020-05-07 (×7): 40 mg via ORAL
  Filled 2020-04-30 (×7): qty 1

## 2020-04-30 MED ORDER — SODIUM CHLORIDE 0.9 % IV BOLUS
500.0000 mL | Freq: Once | INTRAVENOUS | Status: AC
  Administered 2020-04-30: 500 mL via INTRAVENOUS

## 2020-04-30 MED ORDER — APIXABAN 5 MG PO TABS
5.0000 mg | ORAL_TABLET | Freq: Two times a day (BID) | ORAL | Status: DC
  Administered 2020-05-01 – 2020-05-07 (×13): 5 mg via ORAL
  Filled 2020-04-30 (×13): qty 1

## 2020-04-30 MED ORDER — HYDRALAZINE HCL 20 MG/ML IJ SOLN: 5.0000 mg | INTRAMUSCULAR | Status: DC | PRN

## 2020-04-30 MED ORDER — HYDROCODONE-ACETAMINOPHEN 5-325 MG PO TABS
1.0000 | ORAL_TABLET | Freq: Four times a day (QID) | ORAL | Status: DC | PRN
Start: 2020-04-30 — End: 2020-05-07
  Administered 2020-05-02 – 2020-05-03 (×2): 1 via ORAL
  Filled 2020-04-30 (×3): qty 1

## 2020-04-30 MED ORDER — DM-GUAIFENESIN ER 30-600 MG PO TB12: 1.0000 | ORAL_TABLET | Freq: Two times a day (BID) | ORAL | Status: DC | PRN

## 2020-04-30 MED ORDER — TIOTROPIUM BROMIDE MONOHYDRATE 18 MCG IN CAPS
18.0000 ug | ORAL_CAPSULE | Freq: Every day | RESPIRATORY_TRACT | Status: DC
  Administered 2020-05-01 – 2020-05-07 (×6): 18 ug via RESPIRATORY_TRACT
  Filled 2020-04-30 (×2): qty 5

## 2020-04-30 MED ORDER — LOSARTAN POTASSIUM 50 MG PO TABS
100.0000 mg | ORAL_TABLET | Freq: Every day | ORAL | Status: DC
  Administered 2020-05-01 – 2020-05-07 (×7): 100 mg via ORAL
  Filled 2020-04-30 (×7): qty 2

## 2020-04-30 MED ORDER — ACETAMINOPHEN 325 MG PO TABS: 650.0000 mg | ORAL_TABLET | Freq: Four times a day (QID) | ORAL | Status: DC | PRN

## 2020-04-30 MED ORDER — CARVEDILOL 6.25 MG PO TABS
6.2500 mg | ORAL_TABLET | Freq: Two times a day (BID) | ORAL | Status: DC
  Administered 2020-05-01 – 2020-05-07 (×13): 6.25 mg via ORAL
  Filled 2020-04-30 (×13): qty 1

## 2020-04-30 MED ORDER — CARBIDOPA-LEVODOPA 25-250 MG PO TABS
1.0000 | ORAL_TABLET | Freq: Once | ORAL | Status: AC
  Administered 2020-04-30: 1 via ORAL
  Filled 2020-04-30 (×2): qty 1

## 2020-04-30 MED ORDER — BUPROPION HCL ER (SR) 150 MG PO TB12
150.0000 mg | ORAL_TABLET | Freq: Two times a day (BID) | ORAL | Status: DC
  Administered 2020-05-01 – 2020-05-07 (×12): 150 mg via ORAL
  Filled 2020-04-30 (×14): qty 1

## 2020-04-30 MED ORDER — CARBIDOPA-LEVODOPA 25-250 MG PO TABS
2.0000 | ORAL_TABLET | Freq: Three times a day (TID) | ORAL | Status: DC
  Administered 2020-05-01 – 2020-05-07 (×19): 2 via ORAL
  Filled 2020-04-30 (×23): qty 2

## 2020-04-30 MED ORDER — ENTACAPONE 200 MG PO TABS
200.0000 mg | ORAL_TABLET | Freq: Three times a day (TID) | ORAL | Status: DC
  Administered 2020-05-01 – 2020-05-07 (×19): 200 mg via ORAL
  Filled 2020-04-30 (×23): qty 1

## 2020-04-30 MED ORDER — ONDANSETRON HCL 4 MG PO TABS
4.0000 mg | ORAL_TABLET | Freq: Four times a day (QID) | ORAL | Status: DC | PRN
  Administered 2020-05-02: 4 mg via ORAL
  Filled 2020-04-30: qty 1

## 2020-04-30 MED ORDER — ROSUVASTATIN CALCIUM 10 MG PO TABS
10.0000 mg | ORAL_TABLET | Freq: Every day | ORAL | Status: DC
  Administered 2020-05-01 – 2020-05-07 (×7): 10 mg via ORAL
  Filled 2020-04-30 (×8): qty 1

## 2020-04-30 MED ORDER — FLUTICASONE PROPIONATE 50 MCG/ACT NA SUSP
1.0000 | Freq: Every day | NASAL | Status: DC
  Administered 2020-05-01 – 2020-05-07 (×5): 1 via NASAL
  Filled 2020-04-30: qty 16

## 2020-04-30 NOTE — ED Notes (Signed)
Received report from [employer]. Patient moving to [facility]

## 2020-04-30 NOTE — ED Provider Notes (Signed)
[facility] Emergency Department Provider Note  ____________________________________________   Event Date/Time   First MD Initiated Contact with Patient 04/30/20 [phone]     (approximate)  I have reviewed the triage vital signs and the nursing notes.   HISTORY  Chief Complaint Weakness    HPI Andre [name] is a 85 y.o. male  With h/o CHF, CAD, pAFib, h/o PE, here with slurred speech, weakness. Pt arrives via EMS. Per report, he has had increasing generalized weakness, fatigue for the past 2-3 weeks. He has had some increased slurred speech as well and saw Neurology for this - had negative MRI two weeks ago. Per report, pt has had increasing difficulty ambulating and has fallen multiple times in past 24-48 hours. Pt unable to ambulate on his own. Lives at home with his wife. Denies recent med changes. No focal numbness, weakness. Denies any pain or other acute complaints.  Level 5 caveat invoked as remainder of history, ROS, and physical exam limited due to patient's slurred speech.         Past Medical History:  Diagnosis Date  . Atherosclerosis of abdominal aorta ([place])   . Basal cell carcinoma 03/04/2008   Right nose supratip.   [name] CAD (coronary artery disease)   . Cervical spondylosis 10/01/2013  . Chronic diastolic CHF (congestive heart failure) ([place])    a. 07/2016 Echo: >55%; b. 10/2016 Echo: EF 55-60%, Gr1 DD, Ao sclerosis w/o stenosis, sev dil LA; c. 08/2017 Echo: EF 60-65%, no rwma, Gr2 DD, mild AS, sev dil LA/RA.  [name] Coronary artery disease    a. 1998 s/p mini-cabg @ [facility] - LIMA->LAD;  b. 07/2016 St Echo: Inadequate HR w/ HTN response;  c.  08/2016 MV: EF 67%, no ischemia; d. 10/2016 NSTEMI/Cath: RCA 95p (4.0x26 Onyx DES), LIMA->LAD nl; e. 09/2017 Cath: LM 40/30, LAD 100ost, RI 80, LCX nl, OM2/3 nl, RCA patent stent, 24m, LIMA->LAD nl-->Med Rx.  . DDD (degenerative disc disease), cervical   . DDD (degenerative disc disease), lumbar   . Depression   . Gait  abnormality 07/31/2019  . GERD (gastroesophageal reflux disease)   . Hyperlipidemia   . Hypertension   . Hypothyroidism   . PAF (paroxysmal atrial fibrillation) (HCC)    a. s/p DCCV-->maintaining sinus on amiodarone;  b. CHA2DS2VASc = 5-->eliquis.  . Parkinson's disease ([place])    tremors  . Pleural effusion, right    a. 09/2017 s/p thoracentesis.  [name] PNA (pneumonia) 08/26/2017  . Pulmonary embolism ([place]) 2011  . Pulmonary fibrosis ([place])   . 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 Sleep apnea    wears CPAP  . Squamous cell carcinoma of skin 03/19/2015   Right lateral crown. KA-like pattern  . Thrombocytopenia [facility])     Patient Active Problem List   Diagnosis Date Noted  . Leukocytosis 04/30/2020  . Declining functional status 04/30/2020  . Slurred speech 04/20/2020  . Hypersomnia 04/20/2020  . Coccydynia 11/27/2019  . Rib pain on right side 09/30/2019  . Left shoulder pain 09/30/2019  . Gait abnormality 07/31/2019  . Pulmonary fibrosis ([place]) 07/05/2019  . Elevated glucose 11/09/2018  . Educated about COVID-19 virus infection 11/09/2018  . COPD (chronic obstructive pulmonary disease) ([place]) 07/09/2018  . Hematuria 03/09/2018  . Neck pain 03/09/2018  . Myalgia 03/09/2018  . Decreased hearing of both ears 03/09/2018  . Cervical facet syndrome 01/22/2018  . Muscle cramps 11/02/2017  . Renal cyst 10/02/2017  . Pleural effusion 09/18/2017  . Diarrhea 08/21/2017  .  Anemia 08/21/2017  . Renal lesion 07/08/2017  . Fall 05/29/2017  . Lightheadedness 05/29/2017  . Abrasion 02/08/2017  . Anxiety and depression 01/05/2017  . Prediabetes 10/27/2016  . Hypothyroidism 10/27/2016  . (HFpEF) heart failure with preserved ejection fraction ([place]) 09/27/2016  . Hypothyroidism due to medication 09/07/2016  . Parkinson's disease ([place]) 06/30/2016  . Thrombocytopenia ([place]) 01/29/2016  . PAF (paroxysmal atrial fibrillation) ([place])   . BMI 40.0-44.9, adult ([place]) 05/06/2015  . Erythrocytosis  01/28/2015  . Atherosclerosis of abdominal aorta ([place]) 12/08/2014  . Barrett's esophagus 12/31/2013  . DDD (degenerative disc disease), cervical 10/01/2013  . DDD (degenerative disc disease), lumbar 10/01/2013  . Coronary artery disease involving native coronary artery of native heart with angina pectoris ([place]) 10/30/2012  . GERD (gastroesophageal reflux disease) 10/30/2012  . Hyperlipidemia with target LDL less than 70 10/30/2012  . Essential hypertension 10/30/2012  . Dyspnea 09/10/2012  . Allergic rhinitis 09/10/2012  . OSA (obstructive sleep apnea) 09/10/2012    Past Surgical History:  Procedure Laterality Date  . BACK SURGERY  1960  . CARDIAC CATHETERIZATION    . CHOLECYSTECTOMY  2010  . COLONOSCOPY WITH PROPOFOL N/A 06/07/2018   Procedure: COLONOSCOPY WITH PROPOFOL;  Surgeon: Lollie Sails, MD;  Location: [facility] ENDOSCOPY;  Service: Endoscopy;  Laterality: N/A;  . CORONARY ARTERY BYPASS GRAFT  01/07/1997  . CORONARY STENT INTERVENTION N/A 10/31/2016   Procedure: Coronary Stent Intervention;  Surgeon: Wellington Hampshire, MD;  Location: [place] CV LAB;  Service: Cardiovascular;  Laterality: N/A;  . ELECTROPHYSIOLOGIC STUDY N/A 07/14/2015   Procedure: CARDIOVERSION;  Surgeon: Yolonda Kida, MD;  Location: [facility] ORS;  Service: Cardiovascular;  Laterality: N/A;  . ELECTROPHYSIOLOGIC STUDY N/A 10/12/2015   Procedure: CARDIOVERSION;  Surgeon: Minna Merritts, MD;  Location: [facility] ORS;  Service: Cardiovascular;  Laterality: N/A;  . LEFT HEART CATH AND CORONARY ANGIOGRAPHY N/A 10/31/2016   Procedure: Left Heart Cath and Coronary Angiography;  Surgeon: Wellington Hampshire, MD;  Location: [place] CV LAB;  Service: Cardiovascular;  Laterality: N/A;  . OTHER SURGICAL HISTORY  1998   Bypass  . RIGHT/LEFT HEART CATH AND CORONARY ANGIOGRAPHY N/A 09/18/2017   Procedure: RIGHT/LEFT HEART CATH AND CORONARY ANGIOGRAPHY;  Surgeon: Wellington Hampshire, MD;  Location: [place] CV LAB;   Service: Cardiovascular;  Laterality: N/A;    Prior to Admission medications   Medication Sig Start Date End Date Taking? Authorizing Provider  buPROPion (WELLBUTRIN SR) 150 MG 12 hr tablet Take 1 tablet (150 mg total) by mouth 2 (two) times daily. 03/03/20  Yes [name], MD  carbidopa-levodopa (SINEMET IR) 25-250 MG tablet TAKE TWO TABLETS 3 TIMES DAILY Patient taking differently: Take 2 tablets by mouth 3 (three) times daily. 12/24/19  Yes Kathrynn Ducking, MD  carvedilol (COREG) 3.125 MG tablet TAKE TWO TABLETS TWICE A DAY WITH MEALS Patient taking differently: Take 6.25 mg by mouth 2 (two) times daily with a meal. 12/20/19  Yes Arida, Mertie Clause, MD  ELIQUIS 5 MG TABS tablet TAKE ONE TABLET BY MOUTH TWICE DAILY Patient taking differently: Take 5 mg by mouth 2 (two) times daily. 02/10/20  Yes Wellington Hampshire, MD  entacapone (COMTAN) 200 MG tablet Take 1 tablet (200 mg total) by mouth 3 (three) times daily. 06/27/19  Yes Kathrynn Ducking, MD  esomeprazole (NEXIUM) 40 MG capsule TAKE 1 CAPSULE BY MOUTH ONCE DAILY Patient taking differently: Take 40 mg by mouth daily. 02/03/20  Yes [name], MD  furosemide (  LASIX) 20 MG tablet Take 40 mg by mouth daily.   Yes [provider]  levothyroxine (SYNTHROID) 50 MCG tablet TAKE ONE TABLET ON AN EMPTY STOMACH WITHA GLASS OF WATER AT LEAST 30 TO [place] BREAKFAST Patient taking differently: Take 50 mcg by mouth daily before breakfast. 02/04/20  Yes [name], MD  losartan (COZAAR) 100 MG tablet TAKE 1 TABLET BY MOUTH DAILY Patient taking differently: Take 100 mg by mouth daily. 11/05/19  Yes [name], MD  potassium chloride SA (KLOR-CON) 10 MEQ tablet Take 1 tablet (10 mEq total) by mouth daily. 12/12/19  Yes Wellington Hampshire, MD  rosuvastatin (CRESTOR) 10 MG tablet TAKE 1 TABLET BY MOUTH DAILY Patient taking differently: Take 10 mg by mouth daily. 02/18/20  Yes Wellington Hampshire, MD  sertraline  (ZOLOFT) 50 MG tablet Take 1 tablet (50 mg total) by mouth daily. 03/03/20  Yes [name], MD  Tiotropium Bromide-Olodaterol (STIOLTO RESPIMAT) 2.5-2.5 MCG/ACT AERS Inhale 2 puffs into the lungs daily. 10/08/19  Yes Mannam, Praveen, MD  albuterol (PROVENTIL) (2.5 MG/3ML) 0.083% nebulizer solution USE ONE AMPULE(3ML) VIA NEBULIZER EVERY 4 HOURS AS NEEDED FOR WHEEZING OR SHORTNESS OF BREATH 05/09/19   Mannam, Hart Robinsons, MD  fluticasone (FLONASE) 50 MCG/ACT nasal spray USE 2 PUFFS IN EACH NOSTRIL DAILY 09/05/19   [name], MD  HYDROcodone-acetaminophen (NORCO/VICODIN) 5-325 MG tablet Take 1 tablet by mouth every 6 (six) hours as needed for moderate pain or severe pain. Patient not taking: No sig reported 11/23/19   Melynda Ripple, MD  hydrocortisone 2.5 % lotion Apply topically daily. Apply to affected areas 4 times per week. 02/05/20   Ralene Bathe, MD  ketoconazole (NIZORAL) 2 % cream Apply 1 application topically daily as needed for irritation. 01/01/18   [provider]  ketoconazole (NIZORAL) 2 % shampoo Apply 1 application topically 3 (three) times a week. Wash scalp, face and ears 02/05/20   Ralene Bathe, MD  ketorolac Nancie Neas) 0.5 % ophthalmic solution SMARTSIG:1 Drop(s) In Eye(s) Every 4-6 Hours PRN 06/19/19   [provider]  lactulose (CHRONULAC) 10 GM/15ML solution Take 30 mLs (20 g total) by mouth 2 (two) times daily as needed for moderate constipation. 03/18/20   Crecencio Mc, MD  mometasone (ELOCON) 0.1 % cream  01/01/18   [provider]  mupirocin ointment (BACTROBAN) 2 % Apply 1 application topically 3 (three) times daily. Patient taking differently: Apply 1 application topically 3 (three) times daily as needed. 10/30/19   McLean-Scocuzza, Nino Glow, MD  PROAIR HFA 108 [id number] Base) MCG/ACT inhaler INHALE 2 PUFFS INTO THE LUNGS EVERY SIX HOURS AS NEEDED FOR WHEEZING OR SHORTNESS OF BREATH 10/08/19   Martyn Ehrich, NP  Respiratory Therapy  Supplies (FLUTTER) DEVI 1 Device by Does not apply route daily. 05/29/19   Martyn Ehrich, NP    Allergies Pravastatin and Prednisone  Family History  Problem Relation Age of Onset  . Alcohol abuse Father     Social History Social History   Tobacco Use  . Smoking status: Former Smoker    Packs/day: 1.00    Years: 10.00    Pack years: 10.00    Types: Cigarettes, Pipe, Cigars    Quit date: 04/18/1972    Years since quitting: 48.0  . Smokeless tobacco: Former Systems developer    Types: Chew    Quit date: 04/18/1972  Vaping Use  . Vaping Use: Never used  Substance Use Topics  . Alcohol  use: Yes    Alcohol/week: 4.0 standard drinks    Types: 2 Cans of beer, 2 Shots of liquor per week    Comment: per 2 weeks   . Drug use: No    Review of Systems  Review of Systems  Constitutional: Positive for fatigue. Negative for chills and fever.  HENT: Negative for sore throat.   Respiratory: Negative for shortness of breath.   Cardiovascular: Negative for chest pain.  Gastrointestinal: Negative for abdominal pain.  Genitourinary: Negative for flank pain.  Musculoskeletal: Negative for neck pain.  Skin: Negative for rash and wound.  Allergic/Immunologic: Negative for immunocompromised state.  Neurological: Positive for speech difficulty. Negative for weakness and numbness.  Hematological: Does not bruise/bleed easily.  All other systems reviewed and are negative.    ____________________________________________  PHYSICAL EXAM:      VITAL SIGNS: ED Triage Vitals  Enc Vitals Group     BP 04/30/20 0843 (!) 149/85     Pulse Rate 04/30/20 0843 73     Resp 04/30/20 0843 18     Temp 04/30/20 0843 97.6 F (36.4 C)     Temp Source 04/30/20 0843 Oral     SpO2 04/30/20 0843 96 %     Weight 04/30/20 0844 213 lb 13.5 oz (97 kg)     Height 04/30/20 0844 5\' 7"  (1.702 m)     Head Circumference --      Peak Flow --      Pain Score 04/30/20 0844 0     Pain Loc --      Pain Edu? --      Excl. in  [place]? --      Physical Exam Vitals and nursing note reviewed.  Constitutional:      General: He is not in acute distress.    Appearance: He is well-developed.  HENT:     Head: Normocephalic and atraumatic.  Eyes:     Conjunctiva/sclera: Conjunctivae normal.  Cardiovascular:     Rate and Rhythm: Normal rate and regular rhythm.     Heart sounds: Normal heart sounds. No murmur heard. No friction rub.  Pulmonary:     Effort: Pulmonary effort is normal. No respiratory distress.     Breath sounds: Normal breath sounds. No wheezing or rales.  Abdominal:     General: There is no distension.     Palpations: Abdomen is soft.     Tenderness: There is no abdominal tenderness.  Musculoskeletal:     Cervical back: Neck supple.  Skin:    General: Skin is warm.     Capillary Refill: Capillary refill takes less than 2 seconds.  Neurological:     Mental Status: He is alert and oriented to person, place, and time.     Motor: No abnormal muscle tone.     Comments: Speech slurred. CN intact. MAE with 5/5 strength. Normal sensation to light touch. Gait deferred bc pt is unable to ambulate independently.       ____________________________________________   LABS (all labs ordered are listed, but only abnormal results are displayed)  Labs Reviewed  BASIC METABOLIC PANEL - Abnormal; Notable for the following components:      Result Value   Chloride 97 (*)    All other components within normal limits  CBC - Abnormal; Notable for the following components:   WBC 12.7 (*)    All other components within normal limits  URINALYSIS, COMPLETE (UACMP) WITH MICROSCOPIC - Abnormal; Notable for the following components:  Color, Urine AMBER (*)    APPearance CLEAR (*)    Ketones, ur 5 (*)    All other components within normal limits  BRAIN NATRIURETIC PEPTIDE - Abnormal; Notable for the following components:   B Natriuretic Peptide 154.0 (*)    All other components within normal limits  SARS  CORONAVIRUS 2 (TAT 6-24 HRS)  CK  T4, FREE  TSH  TROPONIN I (HIGH SENSITIVITY)  TROPONIN I (HIGH SENSITIVITY)    ____________________________________________  EKG: Atrial flutter, ventricular rate approximately 70, QRS less than 120, QTc 438.  No acute ST elevations or depressions. ________________________________________  RADIOLOGY All imaging, including plain films, CT scans, and ultrasounds, independently reviewed by me, and interpretations confirmed via formal radiology reads.  ED MD interpretation:   CXR: Chronic asbestosis CT Head: [place]  Official radiology report(s): DG Chest 2 View  Result Date: 04/30/2020 CLINICAL DATA:  Weakness. EXAM: CHEST - 2 VIEW COMPARISON:  December 24, 2019. FINDINGS: Stable cardiomegaly. No pneumothorax is noted. Stable interstitial densities and pleural calcifications are noted throughout both lungs. No definite acute abnormality is noted. No significant pleural effusion is noted. Bony thorax is unremarkable. IMPRESSION: Stable interstitial densities and pleural calcifications are noted throughout both lungs consistent with asbestos exposure. Aortic Atherosclerosis (ICD10-I70.0). Electronically Signed   By: Marijo Conception M.D.   On: 04/30/2020 09:34   CT HEAD WO CONTRAST  Result Date: 04/30/2020 CLINICAL DATA:  Mental status change. EXAM: CT HEAD WITHOUT CONTRAST TECHNIQUE: Contiguous axial images were obtained from the base of the skull through the vertex without intravenous contrast. COMPARISON:  MRI head 04/20/2020 FINDINGS: Brain: Generalized atrophy. Negative for hydrocephalus. Patchy white matter hypodensity bilaterally appears chronic and unchanged. Negative for acute infarct or hemorrhage. Small dural based mass in the right lateral temporal lobe consistent with meningioma unchanged from prior MRI. Vascular: Negative for hyperdense vessel Skull: Negative Sinuses/Orbits: Paranasal sinuses clear.  Negative orbit. Other: None IMPRESSION: No acute  abnormality Atrophy and chronic microvascular ischemic change Small meningioma right lateral temporal lobe. Electronically Signed   By: Franchot Gallo M.D.   On: 04/30/2020 09:13    ____________________________________________  PROCEDURES   Procedure(s) performed (including Critical Care):  Procedures  ____________________________________________  INITIAL IMPRESSION / MDM / [place] / ED COURSE  As part of my medical decision making, I reviewed the following data within the [place] notes reviewed and incorporated, Old chart reviewed, Notes from prior ED visits, and [place] Controlled Substance Database       *CAMARION DILLOW was evaluated in Emergency Department on 04/30/2020 for the symptoms described in the history of present illness. He was evaluated in the context of the global COVID-19 pandemic, which necessitated consideration that the patient might be at risk for infection with the SARS-CoV-2 virus that causes COVID-19. Institutional protocols and algorithms that pertain to the evaluation of patients at risk for COVID-19 are in a state of rapid change based on information released by regulatory bodies including the CDC and federal and state organizations. These policies and algorithms were followed during the patient's care in the ED.  Some ED evaluations and interventions may be delayed as a result of limited staffing during the pandemic.*     Medical Decision Making:  85 yo M here with generalized weakness, inability to ambulate. On exam, pt dysarthric, but no other focal neuro deficits. He is markedly weak and has difficulty even sitting upright in bed. Labs, imaging as above. CT head reviewed and  is negative. CXR without PNA. UA shows no signs of UTI but does show ketonuria. BUN:Cr ratio elevated c/w possible dehydration. Labs o/w unremarkable.   Suspect generalized weakness/encephalopathy 2/2 dehydration, possibly polypharmacy in setting of  Parkinson's and possible LBD. Will admit for hydration, PT/OT.  ____________________________________________  FINAL CLINICAL IMPRESSION(S) / ED DIAGNOSES  Final diagnoses:  Weakness  Dehydration     MEDICATIONS GIVEN DURING THIS VISIT:  Medications  tiotropium (SPIRIVA) inhalation capsule ([facility] use ONLY) 18 mcg (has no administration in time range)  dextromethorphan-guaiFENesin (MUCINEX DM) 30-600 MG per 12 hr tablet 1 tablet (has no administration in time range)  ondansetron (ZOFRAN) tablet 4 mg (has no administration in time range)  acetaminophen (TYLENOL) tablet 650 mg (has no administration in time range)  hydrALAZINE (APRESOLINE) injection 5 mg (has no administration in time range)  0.9 %  sodium chloride infusion ( Intravenous New Bag/Given 04/30/20 1935)  sodium chloride 0.9 % bolus 500 mL (0 mLs Intravenous Stopped 04/30/20 1331)  carbidopa-levodopa (SINEMET IR) 25-250 MG per tablet immediate release 1 tablet (1 tablet Oral Given 04/30/20 1327)     ED Discharge Orders    None       Note:  This document was prepared using Dragon voice recognition software and may include unintentional dictation errors.   Duffy Bruce, MD 04/30/20 2024

## 2020-04-30 NOTE — Telephone Encounter (Signed)
The patient was recently seen in the office, he had a recent MRI of the brain.  He was to be set up for physical therapy for his walking.  No obvious source of change in clinical condition.  I am not sure which hospital he went to, no records in epic.

## 2020-04-30 NOTE — Telephone Encounter (Signed)
[name] called to let Dr. [name] know that patient is sitting in the ED. He keeps falling.

## 2020-04-30 NOTE — ED Notes (Signed)
Transport to floor rm 227.AS

## 2020-04-30 NOTE — Telephone Encounter (Signed)
Pt.'s wife [name] called to let Dr. Jannifer Franklin know that husband is currently in hospital. She states he has fallen several times in the last 12 hours. She states they're trying to figure out why he keeps falling.

## 2020-04-30 NOTE — ED Triage Notes (Signed)
Pt to ED from home via [facility] for chief complaint of weakness x2 weeks with multiple falls. Pt with slurred speech, per EMS this is normal  Pt reports dizziness and weakness, denies pain  Oriented to person and place  Ems reports recent mri showing dementia RR even and unlabored

## 2020-04-30 NOTE — Telephone Encounter (Signed)
[name] called to let Dr. [name] know that patient is sitting in the ED. He keeps falling.  Devaughn Savant,cma

## 2020-04-30 NOTE — ED Notes (Signed)
Lab contacted to add on troponin

## 2020-04-30 NOTE — Evaluation (Signed)
Physical Therapy Evaluation Patient Details Name: Andre [name] MRN: [id number] DOB: 02-May-1935 Today's Date: 04/30/2020   History of Present Illness  Pt to ED from home via [facility] for chief complaint of weakness x2 weeks with multiple falls. Pt with slurred speech, per EMS this is normal   Pt reports dizziness and weakness, denies pain. PMH includes: parkinsons, PE, PAF, HTN, HLD, GERD, CAD, depression, CHF  Clinical Impression  Patient received sleeping on stretcher. He wakes to my voice and touch. He is confused, slurred speech which is difficult to understand at times. Requires increased time with all mobility, difficulty processing commands and instructions. He is able to sit on side of stretcher with mod assist, sit to stand with min assist and ambulated about 8 feet in the room near bed with RW. Patient is unsteady when up on his feet. Requires assistance at all times. He had 2 lob with this limited amount of ambulating. Patient will continue to benefit from skilled PT while here to improve functional independence and safety.         Follow Up Recommendations SNF;Supervision for mobility/OOB    Equipment Recommendations  None recommended by PT;Other (comment) (TBD)    Recommendations for Other Services       Precautions / Restrictions Precautions Precautions: Fall Restrictions Weight Bearing Restrictions: No      Mobility  Bed Mobility Overal bed mobility: Needs Assistance Bed Mobility: Supine to Sit;Sit to Supine     Supine to sit: Mod assist Sit to supine: Mod assist   General bed mobility comments: Patient seems to not follow what I am asking of him, requires mod assist for moving legs off side of bed and to raise trunk to seated position. Requires increased time and multiple cues.    Transfers Overall transfer level: Needs assistance Equipment used: Rolling walker (2 wheeled) Transfers: Sit to/from Stand Sit to Stand: Min assist         General transfer  comment: able to stand with min assist from stretcher, increased difficulty getting back on stretcher due to height. Couldnt follow my instruction to sit back far or to scoot back.  Ambulation/Gait Ambulation/Gait assistance: Min assist Gait Distance (Feet): 8 Feet Assistive device: Rolling walker (2 wheeled) Gait Pattern/deviations: Step-to pattern;Decreased step length - right;Decreased step length - left Gait velocity: decr   General Gait Details: unsteady with ambulation even using RW and min assist. LOB x 1-2 walking very short distance in room.  Stairs            Wheelchair Mobility    Modified Rankin (Stroke Patients Only)       Balance Overall balance assessment: Needs assistance Sitting-balance support: Feet unsupported;Bilateral upper extremity supported Sitting balance-Leahy Scale: Fair     Standing balance support: Bilateral upper extremity supported;During functional activity Standing balance-Leahy Scale: Poor Standing balance comment: lob with minimal ambulation this date                             Pertinent Vitals/Pain Pain Assessment: No/denies pain    Home Living Family/patient expects to be discharged to:: Private residence Living Arrangements: Spouse/significant other Available Help at Discharge: Family;Available 24 hours/day Type of Home: House Home Access: Stairs to enter Entrance Stairs-Rails: Can reach both;Left;Right Entrance Stairs-Number of Steps: 2 Home Layout: One level Home Equipment: Cane - single point;Walker - 2 wheels Additional Comments: Per wife, patient was using a cane at home. She borrowed a walker recently.  Prior Function           Comments: Multiple falls in the last few weeks. [name] is unable to handle him. No other family nearby to assist.     Hand Dominance        Extremity/Trunk Assessment   Upper Extremity Assessment Upper Extremity Assessment: Generalized weakness    Lower  Extremity Assessment Lower Extremity Assessment: Generalized weakness    Cervical / Trunk Assessment Cervical / Trunk Assessment: Normal  Communication   Communication: Expressive difficulties  Cognition Arousal/Alertness: Lethargic Behavior During Therapy: WFL for tasks assessed/performed Overall Cognitive Status: Impaired/Different from baseline Area of Impairment: Awareness;Problem solving;Following commands;Safety/judgement;Orientation                 Orientation Level: Disoriented to;Place;Time;Situation     Following Commands: Follows one step commands with increased time;Follows one step commands inconsistently Safety/Judgement: Decreased awareness of safety;Decreased awareness of deficits Awareness: Emergent Problem Solving: Slow processing;Difficulty sequencing;Requires verbal cues;Requires tactile cues General Comments: Patient has difficulty processing commands for mobility. Requires multimodal cues and increased time.      General Comments      Exercises     Assessment/Plan    PT Assessment Patient needs continued PT services  PT Problem List Decreased strength;Decreased mobility;Decreased safety awareness;Decreased coordination;Decreased activity tolerance;Decreased balance;Decreased cognition;Decreased knowledge of precautions;Decreased knowledge of use of DME       PT Treatment Interventions DME instruction;Therapeutic activities;Gait training;Therapeutic exercise;Patient/family education;Stair training;Balance training;Functional mobility training    PT Goals (Current goals can be found in the Care Plan section)  Acute Rehab PT Goals Patient Stated Goal: to improve balance and safety per wife. PT Goal Formulation: With family Time For Goal Achievement: 05/14/20 Potential to Achieve Goals: Fair    Frequency Min 2X/week   Barriers to discharge Decreased caregiver support      Co-evaluation               AM-PAC PT "6 Clicks" Mobility   Outcome Measure Help needed turning from your back to your side while in a flat bed without using bedrails?: A Lot Help needed moving from lying on your back to sitting on the side of a flat bed without using bedrails?: A Lot Help needed moving to and from a bed to a chair (including a wheelchair)?: A Little Help needed standing up from a chair using your arms (e.g., wheelchair or bedside chair)?: A Little Help needed to walk in hospital room?: A Lot Help needed climbing 3-5 steps with a railing? : A Lot 6 Click Score: 14    End of Session Equipment Utilized During Treatment: Gait belt Activity Tolerance: Patient limited by fatigue Patient left: in bed;with family/visitor present Nurse Communication: Mobility status PT Visit Diagnosis: Muscle weakness (generalized) (M62.81);Unsteadiness on feet (R26.81);Other abnormalities of gait and mobility (R26.89);Difficulty in walking, not elsewhere classified (R26.2);Repeated falls (R29.6)    Time: 6948-5462 PT Time Calculation (min) (ACUTE ONLY): 32 min   Charges:   PT Evaluation $PT Eval Moderate Complexity: 1 Mod PT Treatments $Gait Training: 8-22 mins        Hawraa Stambaugh, PT, GCS 04/30/20,1:38 PM

## 2020-04-30 NOTE — H&P (Signed)
History and Physical    ANES FROSS [id number] DOB: 12/05/1935 DOA: 04/30/2020  Referring MD/NP/PA:   PCP: [name], MD   Patient coming from:  The patient is coming from home.  At baseline, pt is independent for most of ADL.        Chief Complaint: fall and weakness  HPI: Andre [name] is a 85 y.o. male with medical history significant of HTN, HLD, COPD, GERD, hypothyroidism, depression, thrombocytopenia, pulmonary fibrosis, PE and atrial fibrillation on Eliquis, Parkinson's disease, CAD, stent placement, dCHF, skin cancer, OSA on CPAP, who presents with fall and weakness.  Per his wife, pt's functional status has been progressively declining since Thanksgiving.  He has generalized weakness, particularly in the past 2 weeks.  Patient has poor appetite, decreased oral intake. Patient has dizziness, slurred speech, and had multiple fall recently.  Patient was seen by his neurologist, and had an MRI of brain on 04/20/2020 which was negative for stroke.  Patient denies chest pain, shortness breath, fever or chills.  He has mild dry cough.  No nausea vomiting, diarrhea, abdominal pain, symptoms of UTI.  ED Course: pt was found to have WBC 12.7, troponin level 6, 6, BNP 154, TSH and Free T4 level normal, negative urinalysis, pending COVID PCR, electrolytes renal function okay, temperature normal, blood pressure, 16/84, heart rate 76, RR 30, oxygen saturation 92-95% on room air.  CT of the head is negative for acute intracranial abnormalities, showed small meningioma in the right temporal lobe.  Chest x-ray showed stable interstitial disease with possible asbestos exposure.  Patient is placed on med-surg bed for obs.   Review of Systems:   General: no fevers, chills, no body weight gain, has fatigue HEENT: no blurry vision, hearing changes or sore throat Respiratory: no dyspnea, has dry coughing, no wheezing CV: no chest pain, no palpitations GI: no nausea, vomiting, abdominal  pain, diarrhea, constipation GU: no dysuria, burning on urination, increased urinary frequency, hematuria  Ext: no leg edema Neuro: no unilateral weakness, numbness, or tingling, no vision change or hearing loss. Has dizziness and fall. Has slurred speech Skin: no rash, no skin tear. MSK: No muscle spasm, no deformity, no limitation of range of movement in spin Heme: No easy bruising.  Travel history: No recent long distant travel.  Allergy:  Allergies  Allergen Reactions  . Pravastatin Other (See Comments)  . Prednisone Other (See Comments)    Pt states that med makes him hyper Pt states that med makes him hyper    Past Medical History:  Diagnosis Date  . Atherosclerosis of abdominal aorta ([place])   . Basal cell carcinoma 03/04/2008   Right nose supratip.   [name] CAD (coronary artery disease)   . Cervical spondylosis 10/01/2013  . Chronic diastolic CHF (congestive heart failure) ([place])    a. 07/2016 Echo: >55%; b. 10/2016 Echo: EF 55-60%, Gr1 DD, Ao sclerosis w/o stenosis, sev dil LA; c. 08/2017 Echo: EF 60-65%, no rwma, Gr2 DD, mild AS, sev dil LA/RA.  [name] Coronary artery disease    a. 1998 s/p mini-cabg @ [facility] - LIMA->LAD;  b. 07/2016 St Echo: Inadequate HR w/ HTN response;  c.  08/2016 MV: EF 67%, no ischemia; d. 10/2016 NSTEMI/Cath: RCA 95p (4.0x26 Onyx DES), LIMA->LAD nl; e. 09/2017 Cath: LM 40/30, LAD 100ost, RI 80, LCX nl, OM2/3 nl, RCA patent stent, 61m, LIMA->LAD nl-->Med Rx.  . DDD (degenerative disc disease), cervical   . DDD (degenerative disc disease), lumbar   .  Depression   . Gait abnormality 07/31/2019  . GERD (gastroesophageal reflux disease)   . Hyperlipidemia   . Hypertension   . Hypothyroidism   . PAF (paroxysmal atrial fibrillation) (HCC)    a. s/p DCCV-->maintaining sinus on amiodarone;  b. CHA2DS2VASc = 5-->eliquis.  . Parkinson's disease ([place])    tremors  . Pleural effusion, right    a. 09/2017 s/p thoracentesis.  [name] PNA (pneumonia) 08/26/2017  . Pulmonary embolism  ([place]) 2011  . Pulmonary fibrosis ([place])   . 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 Sleep apnea    wears CPAP  . Squamous cell carcinoma of skin 03/19/2015   Right lateral crown. KA-like pattern  . Thrombocytopenia ([place])     Past Surgical History:  Procedure Laterality Date  . BACK SURGERY  1960  . CARDIAC CATHETERIZATION    . CHOLECYSTECTOMY  2010  . COLONOSCOPY WITH PROPOFOL N/A 06/07/2018   Procedure: COLONOSCOPY WITH PROPOFOL;  Surgeon: Lollie Sails, MD;  Location: [facility] ENDOSCOPY;  Service: Endoscopy;  Laterality: N/A;  . CORONARY ARTERY BYPASS GRAFT  01/07/1997  . CORONARY STENT INTERVENTION N/A 10/31/2016   Procedure: Coronary Stent Intervention;  Surgeon: Wellington Hampshire, MD;  Location: [place] CV LAB;  Service: Cardiovascular;  Laterality: N/A;  . ELECTROPHYSIOLOGIC STUDY N/A 07/14/2015   Procedure: CARDIOVERSION;  Surgeon: Yolonda Kida, MD;  Location: [facility] ORS;  Service: Cardiovascular;  Laterality: N/A;  . ELECTROPHYSIOLOGIC STUDY N/A 10/12/2015   Procedure: CARDIOVERSION;  Surgeon: Minna Merritts, MD;  Location: [facility] ORS;  Service: Cardiovascular;  Laterality: N/A;  . LEFT HEART CATH AND CORONARY ANGIOGRAPHY N/A 10/31/2016   Procedure: Left Heart Cath and Coronary Angiography;  Surgeon: Wellington Hampshire, MD;  Location: [place] [facility] LAB;  Service: Cardiovascular;  Laterality: N/A;  . OTHER SURGICAL HISTORY  1998   Bypass  . RIGHT/LEFT HEART CATH AND CORONARY ANGIOGRAPHY N/A 09/18/2017   Procedure: RIGHT/LEFT HEART CATH AND CORONARY ANGIOGRAPHY;  Surgeon: Wellington Hampshire, MD;  Location: [place] [facility];  Service: Cardiovascular;  Laterality: N/A;    Social History:  reports that he quit smoking about 48 years ago. His smoking use included cigarettes, pipe, and cigars. He has a 10.00 pack-year smoking history. He quit smokeless tobacco use about 48 years ago.  His smokeless tobacco use included chew. He reports current alcohol use of about 4.0 standard  drinks of alcohol per week. He reports that he does not use drugs.  Family History:  Family History  Problem Relation Age of Onset  . Alcohol abuse Father      Prior to Admission medications   Medication Sig Start Date End Date Taking? Authorizing Provider  albuterol (PROVENTIL) (2.5 MG/3ML) 0.083% nebulizer solution USE ONE AMPULE(3ML) VIA NEBULIZER EVERY 4 HOURS AS NEEDED FOR WHEEZING OR SHORTNESS OF BREATH 05/09/19   Mannam, Praveen, MD  buPROPion (WELLBUTRIN SR) 150 MG 12 hr tablet Take 1 tablet (150 mg total) by mouth 2 (two) times daily. 03/03/20   [name], MD  carbidopa-levodopa (SINEMET IR) 25-250 MG tablet TAKE TWO TABLETS 3 TIMES DAILY 12/24/19   Kathrynn Ducking, MD  carvedilol (COREG) 3.125 MG tablet TAKE TWO TABLETS TWICE A DAY WITH MEALS 12/20/19   Wellington Hampshire, MD  ELIQUIS 5 MG TABS tablet TAKE ONE TABLET BY MOUTH TWICE DAILY 02/10/20   Wellington Hampshire, MD  entacapone (COMTAN) 200 MG tablet Take 1 tablet (200 mg total) by mouth 3 (three) times daily. 06/27/19   Kathrynn Ducking,  MD  esomeprazole (NEXIUM) 40 MG capsule TAKE 1 CAPSULE BY MOUTH ONCE DAILY 02/03/20   [name], MD  fluticasone [facility]) 50 MCG/ACT nasal spray USE 2 PUFFS IN [facility] NOSTRIL DAILY 09/05/19   [name], MD  furosemide (LASIX) 20 MG tablet Take 40 mg by mouth daily.    [provider]  HYDROcodone-acetaminophen (NORCO/VICODIN) 5-325 MG tablet Take 1 tablet by mouth every 6 (six) hours as needed for moderate pain or severe pain. 11/23/19   Melynda Ripple, MD  hydrocortisone 2.5 % lotion Apply topically daily. Apply to affected areas 4 times per week. 02/05/20   Ralene Bathe, MD  ketoconazole (NIZORAL) 2 % cream  01/01/18   [provider]  ketoconazole (NIZORAL) 2 % shampoo Apply 1 application topically 3 (three) times a week. Wash scalp, face and ears 02/05/20   Ralene Bathe, MD  ketorolac Nancie Neas) 0.5 % ophthalmic solution SMARTSIG:1 Drop(s) In  Eye(s) Every 4-6 Hours PRN 06/19/19   [provider]  lactulose (CHRONULAC) 10 GM/15ML solution Take 30 mLs (20 g total) by mouth 2 (two) times daily as needed for moderate constipation. 03/18/20   Crecencio Mc, MD  levothyroxine (SYNTHROID) 50 MCG tablet TAKE ONE TABLET ON AN EMPTY STOMACH WITHA GLASS OF WATER AT LEAST 30 TO 60 MINUTES BEFORE BREAKFAST 02/04/20   [name], MD  losartan (COZAAR) 100 MG tablet TAKE 1 TABLET BY MOUTH DAILY 11/05/19   [name], MD  mometasone (ELOCON) 0.1 % cream  01/01/18   [provider]  mupirocin ointment (BACTROBAN) 2 % Apply 1 application topically 3 (three) times daily. Patient taking differently: Apply 1 application topically 3 (three) times daily as needed. 10/30/19   McLean-Scocuzza, Nino Glow, MD  potassium chloride SA (KLOR-CON) 10 MEQ tablet Take 1 tablet (10 mEq total) by mouth daily. 12/12/19   Wellington Hampshire, MD  PROAIR HFA 108 [id number] Base) MCG/ACT inhaler INHALE 2 PUFFS INTO THE LUNGS EVERY SIX HOURS AS NEEDED FOR WHEEZING OR SHORTNESS OF BREATH 10/08/19   Martyn Ehrich, NP  Respiratory Therapy Supplies (FLUTTER) DEVI 1 Device by Does not apply route daily. 05/29/19   Martyn Ehrich, NP  rosuvastatin (CRESTOR) 10 MG tablet TAKE 1 TABLET BY MOUTH DAILY 02/18/20   Wellington Hampshire, MD  sertraline (ZOLOFT) 50 MG tablet Take 1 tablet (50 mg total) by mouth daily. 03/03/20   [name], MD  Tiotropium Bromide-Olodaterol (STIOLTO RESPIMAT) 2.5-2.5 MCG/ACT AERS Inhale 2 puffs into the lungs daily. 10/08/19   Marshell Garfinkel, MD    Physical Exam: Vitals:   04/30/20 1609 04/30/20 1610 04/30/20 1611 04/30/20 1612  BP:      Pulse: 75 66 72 78  Resp:      Temp:      TempSrc:      SpO2: 96% 96% 97% 95%  Weight:      Height:       General: Not in acute distress.  Dry mucous membranes HEENT:       Eyes: PERRL, EOMI, no scleral icterus.       ENT: No discharge from the ears and nose, no pharynx injection, no  tonsillar enlargement.        Neck: No JVD, no bruit, no mass felt. Heme: No neck lymph node enlargement. Cardiac: S1/S2, RRR, No murmurs, No gallops or rubs. Respiratory:  No rales, wheezing, rhonchi or rubs. GI: Soft, nondistended, nontender, no rebound pain, no organomegaly, BS present. GU:  No hematuria Ext: No pitting leg edema bilaterally. 2+DP/PT pulse bilaterally. Musculoskeletal: No joint deformities, No joint redness or warmth, no limitation of ROM in spin. Skin: No rashes.  Neuro: Alert, oriented X3, has slurred speech, cranial nerves II-XII grossly intact, moves all extremities. Has hand tremor. Psych: Patient is not psychotic, no suicidal or hemocidal ideation.  Labs on Admission: I have personally reviewed following labs and imaging studies  CBC: Recent Labs  Lab 04/30/20 0847  WBC 12.7*  HGB 16.7  HCT 50.9  MCV 88.8  PLT 123456   Basic Metabolic Panel: Recent Labs  Lab 04/30/20 0847  NA 137  K 3.5  CL 97*  CO2 26  GLUCOSE 93  BUN 21  CREATININE 0.96  CALCIUM 9.4   GFR: Estimated Creatinine Clearance: 65.9 mL/min (by C-G formula based on SCr of 0.96 mg/dL). Liver Function Tests: No results for input(s): AST, ALT, ALKPHOS, BILITOT, PROT, ALBUMIN in the last 168 hours. No results for input(s): LIPASE, AMYLASE in the last 168 hours. No results for input(s): AMMONIA in the last 168 hours. Coagulation Profile: No results for input(s): INR, PROTIME in the last 168 hours. Cardiac Enzymes: Recent Labs  Lab 04/30/20 0847  CKTOTAL 173   BNP (last 3 results) No results for input(s): PROBNP in the last 8760 hours. HbA1C: No results for input(s): HGBA1C in the last 72 hours. CBG: No results for input(s): GLUCAP in the last 168 hours. Lipid Profile: No results for input(s): CHOL, HDL, LDLCALC, TRIG, CHOLHDL, LDLDIRECT in the last 72 hours. Thyroid Function Tests: Recent Labs    04/30/20 1432  TSH 4.325  FREET4 1.10   Anemia Panel: No results for  input(s): VITAMINB12, FOLATE, FERRITIN, TIBC, IRON, RETICCTPCT in the last 72 hours. Urine analysis:    Component Value Date/Time   COLORURINE AMBER (A) 04/30/2020 1013   APPEARANCEUR CLEAR (A) 04/30/2020 1013   APPEARANCEUR Clear 09/03/2018 1119   LABSPEC 1.023 04/30/2020 1013   PHURINE 5.0 04/30/2020 1013   GLUCOSEU NEGATIVE 04/30/2020 1013   HGBUR NEGATIVE 04/30/2020 [place] 04/30/2020 [place] 04/20/2020 1448   BILIRUBINUR Negative 09/03/2018 1119   KETONESUR 5 (A) 04/30/2020 1013   PROTEINUR NEGATIVE 04/30/2020 1013   UROBILINOGEN 0.2 04/20/2020 1448   NITRITE NEGATIVE 04/30/2020 1013   LEUKOCYTESUR NEGATIVE 04/30/2020 1013   Sepsis Labs: @LABRCNTIP (procalcitonin:4,lacticidven:4) )No results found for this or any previous visit (from the past 240 hour(s)).   Radiological Exams on Admission: DG Chest 2 View  Result Date: 04/30/2020 CLINICAL DATA:  Weakness. EXAM: CHEST - 2 VIEW COMPARISON:  December 24, 2019. FINDINGS: Stable cardiomegaly. No pneumothorax is noted. Stable interstitial densities and pleural calcifications are noted throughout both lungs. No definite acute abnormality is noted. No significant pleural effusion is noted. Bony thorax is unremarkable. IMPRESSION: Stable interstitial densities and pleural calcifications are noted throughout both lungs consistent with asbestos exposure. Aortic Atherosclerosis (ICD10-I70.0). Electronically Signed   By: Marijo Conception M.D.   On: 04/30/2020 09:34   CT HEAD WO CONTRAST  Result Date: 04/30/2020 CLINICAL DATA:  Mental status change. EXAM: CT HEAD WITHOUT CONTRAST TECHNIQUE: Contiguous axial images were obtained from the base of the skull through the vertex without intravenous contrast. COMPARISON:  MRI head 04/20/2020 FINDINGS: Brain: Generalized atrophy. Negative for hydrocephalus. Patchy white matter hypodensity bilaterally appears chronic and unchanged. Negative for acute infarct or  hemorrhage. Small dural based mass in the right lateral temporal lobe consistent with meningioma unchanged from prior MRI.  Vascular: Negative for hyperdense vessel Skull: Negative Sinuses/Orbits: Paranasal sinuses clear.  Negative orbit. Other: None IMPRESSION: No acute abnormality Atrophy and chronic microvascular ischemic change Small meningioma right lateral temporal lobe. Electronically Signed   By: Franchot Gallo M.D.   On: 04/30/2020 09:13     EKG: I have personally reviewed.  Difficult to determine rhythm with artificial reflux, QTC 438, poor R wave progression, low voltage, LAD  Assessment/Plan Principal Problem:   Fall Active Problems:   PAF (paroxysmal atrial fibrillation) (HCC)   Parkinson's disease ([place])   Coronary artery disease involving native coronary artery of native heart with angina pectoris (HCC)   GERD (gastroesophageal reflux disease)   Hyperlipidemia with target LDL less than 70   Essential hypertension   (HFpEF) heart failure with preserved ejection fraction (HCC)   Hypothyroidism   Anxiety and depression   COPD (chronic obstructive pulmonary disease) (HCC)   Pulmonary fibrosis (HCC)   Leukocytosis   Declining functional status   Declining functional status, multiple falls and generalized weakness: Possibly due to progression of Parkinson's disease.  TSH and free T4 level normal.  CT head negative for acute intracranial abnormalities. patient had MRI of brain 04/20/2020 which was negative for stroke.  [name] states that she cannot take care of patient at home.  Patient will need rehab placement.  Patient seems to be dehydrated  -place in med-surg bed for obs -PT/OT -IV fluid: 500 cc normal saline, then 75 cc/h -Consult SW/CM  PAF (paroxysmal atrial fibrillation) (HCC) -Coreg, Eliquis  Parkinson's disease ([place]) -Sinemet  Coronary artery disease involving native coronary artery of native heart with angina pectoris (HCC) -continue Crestor  GERD  (gastroesophageal reflux disease) -Protonix  Hyperlipidemia with target LDL less than 70 -Crestor  Essential hypertension -IV hydralazine IV as needed -Coreg, Cozaar  (HFpEF) heart failure with preserved ejection fraction ([place]): 2D echo on 08/28/2017 showed EF of 60 deficit 5% with grade 2 diastolic dysfunction.  Patient does not have leg edema, patient seems to have dehydration today. -Hold Lasix -Check BNP --> 154  Hypothyroidism -Synthroid  Anxiety and depression -Continue home medications  COPD (chronic obstructive pulmonary disease) (HCC) and Pulmonary fibrosis (HCC) -Bronchodilators  Leukocytosis: WBC 12.7.  No fever.  No source of infection identified.  Likely reactive -Follow-up with CBC       Status is: Observation  The patient remains OBS appropriate and will d/c before 2 midnights.  Dispo: The patient is from: Home              Anticipated d/c is to: possible rehab facility              Anticipated d/c date is: 1 day              Patient currently is not medically stable to d/c.           DVT ppx: Eliquis Code Status: Full code per his wife Family Communication: Yes, patient's wife by phone  Disposition Plan:  Anticipate discharge back to previous environment Consults called:  none Admission status: Med-surg bed for obs      Date of Service 04/30/2020    [facility]   If 7PM-7AM, please contact night-coverage [email or web address] 04/30/2020, 5:31 PM

## 2020-04-30 NOTE — Telephone Encounter (Signed)
Noted. We will see what comes of their evaluation.

## 2020-04-30 NOTE — ED Notes (Signed)
Pt states "I fell on the bed and about hit my brains out". Endorses blood thinners. States slurred speech has been going on for "a year or so". No

## 2020-04-30 NOTE — ED Notes (Signed)
Pt unable to stand for standing BP. Becomes too unsteady.

## 2020-05-01 DIAGNOSIS — F419 Anxiety disorder, unspecified: Secondary | ICD-10-CM | POA: Diagnosis not present

## 2020-05-01 DIAGNOSIS — I25119 Atherosclerotic heart disease of native coronary artery with unspecified angina pectoris: Secondary | ICD-10-CM | POA: Diagnosis not present

## 2020-05-01 DIAGNOSIS — F32A Depression, unspecified: Secondary | ICD-10-CM | POA: Diagnosis not present

## 2020-05-01 LAB — CBC
HCT: 50.8 % (ref 39.0–52.0)
Hemoglobin: 16 g/dL (ref 13.0–17.0)
MCH: 28.4 pg (ref 26.0–34.0)
MCHC: 31.5 g/dL (ref 30.0–36.0)
MCV: 90.2 fL (ref 80.0–100.0)
Platelets: 140 10*3/uL — ABNORMAL LOW (ref 150–400)
RBC: 5.63 MIL/uL (ref 4.22–5.81)
RDW: 15.6 % — ABNORMAL HIGH (ref 11.5–15.5)
WBC: 9.9 10*3/uL (ref 4.0–10.5)
nRBC: 0 % (ref 0.0–0.2)

## 2020-05-01 LAB — BASIC METABOLIC PANEL
Anion gap: 12 (ref 5–15)
BUN: 19 mg/dL (ref 8–23)
CO2: 27 mmol/L (ref 22–32)
Calcium: 8.7 mg/dL — ABNORMAL LOW (ref 8.9–10.3)
Chloride: 102 mmol/L (ref 98–111)
Creatinine, Ser: 0.87 mg/dL (ref 0.61–1.24)
GFR, Estimated: >60 mL/min (ref >60)
Glucose, Bld: 69 mg/dL — ABNORMAL LOW (ref 70–99)
Potassium: 3.2 mmol/L — ABNORMAL LOW (ref 3.5–5.1)
Sodium: 141 mmol/L (ref 135–145)

## 2020-05-01 LAB — SARS CORONAVIRUS 2 (TAT 6-24 HRS): SARS Coronavirus 2: NEGATIVE

## 2020-05-01 MED ORDER — POTASSIUM CHLORIDE CRYS ER 20 MEQ PO TBCR
40.0000 meq | EXTENDED_RELEASE_TABLET | Freq: Two times a day (BID) | ORAL | Status: AC
  Administered 2020-05-01 (×2): 40 meq via ORAL
  Filled 2020-05-01 (×3): qty 2

## 2020-05-01 MED ORDER — BISACODYL 10 MG RE SUPP
10.0000 mg | Freq: Once | RECTAL | Status: DC
  Filled 2020-05-01: qty 1

## 2020-05-01 NOTE — TOC Initial Note (Signed)
Transition of Care [facility]) - Initial/Assessment Note    Patient Details  Name: Andre [name] MRN: [id number] Date of Birth: 1935/12/19  Transition of Care [facility]) CM/SW Contact:    Eileen Stanford, LCSW Phone Number: 05/01/2020, 12:03 PM  Clinical Narrative:      CSW spoke with pt at bedside. Pt's spouse present. They are agreeable to SNF. At this time they prefer [facility] due to location. CSW will send referral.              Expected Discharge Plan: Skilled Nursing Facility Barriers to Discharge: Continued Medical Work up   Patient Goals and CMS Choice Patient states their goals for this hospitalization and ongoing recovery are:: to get better   Choice offered to / list presented to : [facility]  Expected Discharge Plan and Services Expected Discharge Plan: [place] In-house Referral: Clinical Social Work   Post Acute Care Choice: [place] Living arrangements for the past 2 months: [place]                                      Prior Living Arrangements/Services Living arrangements for the past 2 months: Single Family Home Lives with:: Spouse Patient language and need for interpreter reviewed:: Yes Do you feel safe going back to the place where you live?: Yes      Need for Family Participation in Patient Care: Yes (Comment) Care giver support system in place?: Yes (comment)   Criminal Activity/Legal Involvement Pertinent to Current Situation/Hospitalization: No - Comment as needed  Activities of Daily Living      Permission Sought/Granted Permission sought to share information with : Family Supports Permission granted to share information with : Yes, Verbal Permission Granted  Share Information with NAME: patricia  Permission granted to share info w AGENCY: [facility]  Permission granted to share info w Relationship: spouse     Emotional Assessment Appearance:: Appears stated  age Attitude/Demeanor/Rapport: Engaged Affect (typically observed): Accepting,Appropriate Orientation: : Oriented to Self,Oriented to Place,Oriented to  Time,Oriented to Situation Alcohol / Substance Use: Not Applicable Psych Involvement: No (comment)  Admission diagnosis:  Dehydration [E86.0] Weakness [R53.1] Fall [W19.XXXA] Patient Active Problem List   Diagnosis Date Noted  . Leukocytosis 04/30/2020  . Declining functional status 04/30/2020  . Slurred speech 04/20/2020  . Hypersomnia 04/20/2020  . Coccydynia 11/27/2019  . Rib pain on right side 09/30/2019  . Left shoulder pain 09/30/2019  . Gait abnormality 07/31/2019  . Pulmonary fibrosis ([place]) 07/05/2019  . Elevated glucose 11/09/2018  . Educated about COVID-19 virus infection 11/09/2018  . COPD (chronic obstructive pulmonary disease) ([place]) 07/09/2018  . Hematuria 03/09/2018  . Neck pain 03/09/2018  . Myalgia 03/09/2018  . Decreased hearing of both ears 03/09/2018  . Cervical facet syndrome 01/22/2018  . Muscle cramps 11/02/2017  . Renal cyst 10/02/2017  . Pleural effusion 09/18/2017  . Diarrhea 08/21/2017  . Anemia 08/21/2017  . Renal lesion 07/08/2017  . Fall 05/29/2017  . Lightheadedness 05/29/2017  . Abrasion 02/08/2017  . Anxiety and depression 01/05/2017  . Prediabetes 10/27/2016  . Hypothyroidism 10/27/2016  . (HFpEF) heart failure with preserved ejection fraction ([place]) 09/27/2016  . Hypothyroidism due to medication 09/07/2016  . Parkinson's disease ([place]) 06/30/2016  . Thrombocytopenia ([place]) 01/29/2016  . PAF (paroxysmal atrial fibrillation) ([place])   . BMI 40.0-44.9, adult ([place]) 05/06/2015  . Erythrocytosis 01/28/2015  . Atherosclerosis of  abdominal aorta ([place]) 12/08/2014  . Barrett's esophagus 12/31/2013  . DDD (degenerative disc disease), cervical 10/01/2013  . DDD (degenerative disc disease), lumbar 10/01/2013  . Coronary artery disease involving native coronary artery of native heart with angina  pectoris ([place]) 10/30/2012  . GERD (gastroesophageal reflux disease) 10/30/2012  . Hyperlipidemia with target LDL less than 70 10/30/2012  . Essential hypertension 10/30/2012  . Dyspnea 09/10/2012  . Allergic rhinitis 09/10/2012  . OSA (obstructive sleep apnea) 09/10/2012   PCP:  [name], MD Pharmacy:   [place], [place] - [place] [address] Phone: [phone] Fax: [phone]  [place], [place] [address] Phone: [phone] Fax: [phone]  [id number] - [place], [place] [address] Phone: [phone] Fax: [phone]     Social Determinants of Health (SDOH) Interventions    Readmission Risk Interventions No flowsheet data found.

## 2020-05-01 NOTE — Progress Notes (Signed)
PROGRESS NOTE    XANG BROADEN  [id number] DOB: 1936/01/23 DOA: 04/30/2020 PCP: [name], MD    Brief Narrative:  85 y.o. male with medical history significant of HTN, HLD, COPD, GERD, hypothyroidism, depression, thrombocytopenia, pulmonary fibrosis, PE and atrial fibrillation on Eliquis, Parkinson's disease, CAD, stent placement, dCHF, skin cancer, OSA on CPAP, who presents with fall and weakness.  Per his wife, pt's functional status has been progressively declining since Thanksgiving.  He has generalized weakness, particularly in the past 2 weeks.  Patient has poor appetite, decreased oral intake. Patient has dizziness, slurred speech, and had multiple fall recently.  Patient was seen by his neurologist, and had an MRI of brain on 04/20/2020 which was negative for stroke.  Patient denies chest pain, shortness breath, fever or chills.  He has mild dry cough.  No nausea vomiting, diarrhea, abdominal pain, symptoms of UTI.  ED Course: pt was found to have WBC 12.7, troponin level 6, 6, BNP 154, TSH and Free T4 level normal, negative urinalysis, pending COVID PCR, electrolytes renal function okay, temperature normal, blood pressure, 16/84, heart rate 76, RR 30, oxygen saturation 92-95% on room air.  CT of the head is negative for acute intracranial abnormalities, showed small meningioma in the right temporal lobe.  Chest x-ray showed stable interstitial disease with possible asbestos exposure  Assessment & Plan:   Principal Problem:   Fall Active Problems:   PAF (paroxysmal atrial fibrillation) (HCC)   Parkinson's disease ([place])   Coronary artery disease involving native coronary artery of native heart with angina pectoris (HCC)   GERD (gastroesophageal reflux disease)   Hyperlipidemia with target LDL less than 70   Essential hypertension   (HFpEF) heart failure with preserved ejection fraction (HCC)   Hypothyroidism   Anxiety and depression   COPD (chronic obstructive  pulmonary disease) (HCC)   Pulmonary fibrosis (HCC)   Leukocytosis   Declining functional status   Declining functional status, multiple falls and generalized weakness:  -Increased weakness likely secondary to dehydration and poor po intake -Presented clinically dehydrated on presentation, given IVF hydration -CT head noted to be unremarkable -TSH and free T4 level normal.  CT head negative for acute intracranial abnormalities. patient had MRI of brain 04/20/2020 which was negative for stroke. -[name] states that she cannot take care of patient at home. -TOC following for SNF placement  PAF (paroxysmal atrial fibrillation) (HCC) -Coreg, Eliquis as tolerated -Currently rate controlled  Parkinson's disease ([place]) -Sinemet  Coronary artery disease involving native coronary artery of native heart with angina pectoris (HCC) -continue Crestor  GERD (gastroesophageal reflux disease) -Continue with Protonix  Hyperlipidemia with target LDL less than 70 -Crestor  Essential hypertension -IV hydralazine IV as needed -Coreg, Cozaar -BP currently stable and controlled  (HFpEF) heart failure with preserved ejection fraction ([place]): 2D echo on 08/28/2017 showed EF of 60 deficit 5% with grade 2 diastolic dysfunction.  Patient does not have leg edema, patient seems to have dehydration today. -Lasix on hold given concerns of dehydration -Check BNP --> 154  Hypothyroidism -Synthroid  Anxiety and depression -Continue home medications  COPD (chronic obstructive pulmonary disease) (HCC) and Pulmonary fibrosis (HCC) -Bronchodilators  Leukocytosis: WBC 12.7.  No fever.  No source of infection identified.  Likely reactive -Repeat WBC normalized without needing abx  Constipation -Reports difficulty with bowel movement recently, last bm was several days ago -Abd feels mildly distended -Will give trial of lactulose with dulcolax suppository    DVT prophylaxis:  Eliquis Code  Status: Full Family Communication: Pt in room, wife at bedside  Status is: Observation  The patient remains OBS appropriate and will d/c before 2 midnights.  Dispo: The patient is from: Home              Anticipated d/c is to: SNF              Anticipated d/c date is: 2 days              Patient currently is not medically stable to d/c.   Consultants:     Procedures:     Antimicrobials: Anti-infectives (From admission, onward)   None       Subjective: Continues with decreased appetite. Constipated  Objective: Vitals:   04/30/20 2230 04/30/20 2341 05/01/20 0522 05/01/20 1125  BP: (!) 156/68 120/79 (!) 144/79 124/65  Pulse: 85 80 91 75  Resp: 20 16 17 16   Temp:  98 F (36.7 C) 97.8 F (36.6 C) 98 F (36.7 C)  TempSrc:  Oral Oral   SpO2: 98% 96% 98% 96%  Weight:      Height:        Intake/Output Summary (Last 24 hours) at 05/01/2020 1504 Last data filed at 05/01/2020 1407 Gross per 24 hour  Intake 360 ml  Output 250 ml  Net 110 ml   Filed Weights   04/30/20 0844  Weight: 97 kg    Examination:  General exam: Appears calm and comfortable  Respiratory system: Clear to auscultation. Respiratory effort normal. Cardiovascular system: S1 & S2 heard, Regular Gastrointestinal system: mildly distended, decreased bs Central nervous system: Alert and oriented. No focal neurological deficits. Extremities: Symmetric 5 x 5 power. Skin: No rashes, lesions Psychiatry: Judgement and insight appear normal. Mood & affect appropriate.   Data Reviewed: I have personally reviewed following labs and imaging studies  CBC: Recent Labs  Lab 04/30/20 0847 05/01/20 0218  WBC 12.7* 9.9  HGB 16.7 16.0  HCT 50.9 50.8  MCV 88.8 90.2  PLT 164 338*   Basic Metabolic Panel: Recent Labs  Lab 04/30/20 0847 05/01/20 0218  NA 137 141  K 3.5 3.2*  CL 97* 102  CO2 26 27  GLUCOSE 93 69*  BUN 21 19  CREATININE 0.96 0.87  CALCIUM 9.4 8.7*   GFR: Estimated  Creatinine Clearance: 72.7 mL/min (by C-G formula based on SCr of 0.87 mg/dL). Liver Function Tests: No results for input(s): AST, ALT, ALKPHOS, BILITOT, PROT, ALBUMIN in the last 168 hours. No results for input(s): LIPASE, AMYLASE in the last 168 hours. No results for input(s): AMMONIA in the last 168 hours. Coagulation Profile: No results for input(s): INR, PROTIME in the last 168 hours. Cardiac Enzymes: Recent Labs  Lab 04/30/20 0847  CKTOTAL 173   BNP (last 3 results) No results for input(s): PROBNP in the last 8760 hours. HbA1C: No results for input(s): HGBA1C in the last 72 hours. CBG: No results for input(s): GLUCAP in the last 168 hours. Lipid Profile: No results for input(s): CHOL, HDL, LDLCALC, TRIG, CHOLHDL, LDLDIRECT in the last 72 hours. Thyroid Function Tests: Recent Labs    04/30/20 1432  TSH 4.325  FREET4 1.10   Anemia Panel: No results for input(s): VITAMINB12, FOLATE, FERRITIN, TIBC, IRON, RETICCTPCT in the last 72 hours. Sepsis Labs: No results for input(s): PROCALCITON, LATICACIDVEN in the last 168 hours.  Recent Results (from the past 240 hour(s))  SARS CORONAVIRUS 2 (TAT 6-24 HRS) Nasopharyngeal Nasopharyngeal Swab     Status: None  Collection Time: 04/30/20  2:28 PM   Specimen: Nasopharyngeal Swab  Result Value Ref Range Status   SARS Coronavirus 2 NEGATIVE NEGATIVE Final    Comment: (NOTE) SARS-CoV-2 target nucleic acids are NOT DETECTED.  The SARS-CoV-2 RNA is generally detectable in upper and lower respiratory specimens during the acute phase of infection. Negative results do not preclude SARS-CoV-2 infection, do not rule out co-infections with other pathogens, and should not be used as the sole basis for treatment or other patient management decisions. Negative results must be combined with clinical observations, patient history, and epidemiological information. The expected result is Negative.  Fact Sheet for  Patients: SugarRoll.be  Fact Sheet for Healthcare Providers: https://www.woods-mathews.com/  This test is not yet approved or cleared by the [place] FDA and  has been authorized for detection and/or diagnosis of SARS-CoV-2 by FDA under an Emergency Use Authorization (EUA). This EUA will remain  in effect (meaning this test can be used) for the duration of the COVID-19 declaration under Se ction 564(b)(1) of the Act, 21 U.S.C. section 360bbb-3(b)(1), unless the authorization is terminated or revoked sooner.  Performed at [facility], [place] [address], [place], [place] [address]      Radiology Studies: DG Chest 2 View  Result Date: 04/30/2020 CLINICAL DATA:  Weakness. EXAM: CHEST - 2 VIEW COMPARISON:  December 24, 2019. FINDINGS: Stable cardiomegaly. No pneumothorax is noted. Stable interstitial densities and pleural calcifications are noted throughout both lungs. No definite acute abnormality is noted. No significant pleural effusion is noted. Bony thorax is unremarkable. IMPRESSION: Stable interstitial densities and pleural calcifications are noted throughout both lungs consistent with asbestos exposure. Aortic Atherosclerosis (ICD10-I70.0). Electronically Signed   By: Marijo Conception M.D.   On: 04/30/2020 09:34   CT HEAD WO CONTRAST  Result Date: 04/30/2020 CLINICAL DATA:  Mental status change. EXAM: CT HEAD WITHOUT CONTRAST TECHNIQUE: Contiguous axial images were obtained from the base of the skull through the vertex without intravenous contrast. COMPARISON:  MRI head 04/20/2020 FINDINGS: Brain: Generalized atrophy. Negative for hydrocephalus. Patchy white matter hypodensity bilaterally appears chronic and unchanged. Negative for acute infarct or hemorrhage. Small dural based mass in the right lateral temporal lobe consistent with meningioma unchanged from prior MRI. Vascular: Negative for hyperdense vessel Skull: Negative Sinuses/Orbits:  Paranasal sinuses clear.  Negative orbit. Other: None IMPRESSION: No acute abnormality Atrophy and chronic microvascular ischemic change Small meningioma right lateral temporal lobe. Electronically Signed   By: Franchot Gallo M.D.   On: 04/30/2020 09:13    Scheduled Meds: . apixaban  5 mg Oral BID  . bisacodyl  10 mg Rectal Once  . buPROPion  150 mg Oral BID  . carbidopa-levodopa  2 tablet Oral TID  . carvedilol  6.25 mg Oral BID WC  . entacapone  200 mg Oral TID  . fluticasone  1 spray Each Nare Daily  . levothyroxine  50 mcg Oral QAC breakfast  . losartan  100 mg Oral Daily  . pantoprazole  40 mg Oral Daily  . potassium chloride  40 mEq Oral BID  . rosuvastatin  10 mg Oral Daily  . sertraline  50 mg Oral Daily  . tiotropium  18 mcg Inhalation Daily   Continuous Infusions: . sodium chloride 75 mL/hr at 05/01/20 1356     LOS: 0 days   Marylu Lund, MD [facility] Pager On Amion  If 7PM-7AM, please contact night-coverage 05/01/2020, 3:04 PM

## 2020-05-01 NOTE — Telephone Encounter (Signed)
I called and spoke with the patient's wife and informed her that the hospital would not give the B12 injections that she can reschedule for a later date, the patient's wife stated the patient may be going into a rehab facility and I informed her that if he does the facility would send [place] paper work and they would give him the B12 injections along with his other medication, I informed her to call us if he goes home and reschedule. She understood.  Lalla Laham,cma

## 2020-05-01 NOTE — Progress Notes (Signed)
PT Cancellation Note  Patient Details Name: Andre [name] MRN: [id number] DOB: July 01, 1935   Cancelled Treatment:    Reason Eval/Treat Not Completed: Patient declined, no reason specified. Patient reports he is very sleepy and would like to continue sleeping. Asks that I come back later. Will re-attempt later if time allows.      Shlome Baldree 05/01/2020, 10:07 AM

## 2020-05-01 NOTE — Evaluation (Signed)
Occupational Therapy Evaluation Patient Details Name: Andre [name] MRN: [id number] DOB: 06/16/1935 Today's Date: 05/01/2020    History of Present Illness Pt to ED from home via [facility] for chief complaint of weakness x2 weeks with multiple falls. Pt with slurred speech, per EMS this is normal   Pt reports dizziness and weakness, denies pain. PMH includes: parkinsons, PE, PAF, HTN, HLD, GERD, CAD, depression, CHF   Clinical Impression   Patient presenting with decreased I in self care, balance, functional mobility/transfers, endurance, and safety awareness. Patient reports living at home with wife and using cane for mobility and no assistance for self care  PTA. Pt's wife reports to staff he has been using RW recently with multiple falls within the home and it has been getting more difficult to manage safely. Pt does appear confused this session and asking therapist, " What day is it?" and " Isn't my wife admitted here too?" Patient currently functioning at min A overall but very lethargic and needing min - mod cuing for safety with increased time to process. Patient will benefit from acute OT to increase overall independence in the areas of ADLs, functional mobility, and safety awareness in order to safely discharge to next venue of care.    Follow Up Recommendations  SNF;Supervision/Assistance - 24 hour    Equipment Recommendations  Other (comment) (defer to next venue of care)       Precautions / Restrictions Precautions Precautions: Fall      Mobility Bed Mobility Overal bed mobility: Needs Assistance Bed Mobility: Supine to Sit;Sit to Supine     Supine to sit: Min assist Sit to supine: Min assist   General bed mobility comments: increased time to process information with min-mod multimodal cuing    Transfers Overall transfer level: Needs assistance Equipment used: Rolling walker (2 wheeled) Transfers: Sit to/from Stand Sit to Stand: Min assist         General transfer  comment: without use of AD    Balance Overall balance assessment: Needs assistance Sitting-balance support: Feet unsupported;Bilateral upper extremity supported Sitting balance-Leahy Scale: Good Sitting balance - Comments: no LOB   Standing balance support: Bilateral upper extremity supported;During functional activity Standing balance-Leahy Scale: Fair Standing balance comment: UE support for balance                           ADL either performed or assessed with clinical judgement   ADL Overall ADL's : Needs assistance/impaired     Grooming: Wash/dry hands;Wash/dry face;Oral care;Standing;Minimal assistance               Lower Body Dressing: Maximal assistance;Sitting/lateral leans                       Vision Patient Visual Report: No change from baseline              Pertinent Vitals/Pain Pain Assessment: No/denies pain     Hand Dominance Right   Extremity/Trunk Assessment Upper Extremity Assessment Upper Extremity Assessment: Generalized weakness   Lower Extremity Assessment Lower Extremity Assessment: Generalized weakness   Cervical / Trunk Assessment Cervical / Trunk Assessment: Normal   Communication Communication Communication: Expressive difficulties   Cognition Arousal/Alertness: Lethargic Behavior During Therapy: WFL for tasks assessed/performed Overall Cognitive Status: Impaired/Different from baseline Area of Impairment: Awareness;Problem solving;Following commands;Safety/judgement;Orientation                 Orientation Level: Disoriented to;Place;Time;Situation  Following Commands: Follows one step commands with increased time;Follows one step commands inconsistently Safety/Judgement: Decreased awareness of safety;Decreased awareness of deficits Awareness: Emergent Problem Solving: Slow processing;Difficulty sequencing;Requires verbal cues;Requires tactile cues General Comments: Pt verbalized being in the  hospital but unable to verbalize date and believes his wife to also be in hospital (she is not)              Home Living Family/patient expects to be discharged to:: Private residence Living Arrangements: Spouse/significant other Available Help at Discharge: Family;Available 24 hours/day Type of Home: House Home Access: Stairs to enter [employer] of Steps: 2 Entrance Stairs-Rails: Can reach both;Left;Right Home Layout: One level     Bathroom Shower/Tub: International aid/development worker Accessibility: Yes   Home Equipment: Cane - single point;Walker - 2 wheels   Additional Comments: Per wife, patient was using a cane at home. She borrowed a walker recently.( per PT eval)      Prior Functioning/Environment          Comments: Multiple falls in the last few weeks. [name] is unable to handle him. No other family nearby to assist.        OT Problem List: Decreased strength;Decreased activity tolerance;Impaired balance (sitting and/or standing);Decreased safety awareness;Decreased knowledge of precautions;Decreased cognition;Decreased knowledge of use of DME or AE      OT Treatment/Interventions: Self-care/ADL training;Therapeutic exercise;Energy conservation;DME and/or AE instruction;Therapeutic activities;Balance training;Patient/family education;Manual therapy    OT Goals(Current goals can be found in the care plan section) Acute Rehab OT Goals Patient Stated Goal: to go home OT Goal Formulation: With patient Time For Goal Achievement: 05/15/20 Potential to Achieve Goals: Good ADL Goals Pt Will Perform Grooming: with modified independence;standing Pt Will Perform Lower Body Dressing: with supervision;sit to/from stand Pt Will Transfer to Toilet: with supervision;ambulating Pt Will Perform Toileting - Clothing Manipulation and hygiene: with supervision;sit to/from stand  OT Frequency: Min 1X/week   Barriers to D/C: Other (comment)  none at this time           AM-PAC OT "6 Clicks" Daily Activity     Outcome Measure Help from another person eating meals?: A Little Help from another person taking care of personal grooming?: A Little Help from another person toileting, which includes using toliet, bedpan, or urinal?: A Lot Help from another person bathing (including washing, rinsing, drying)?: A Lot Help from another person to put on and taking off regular upper body clothing?: A Little Help from another person to put on and taking off regular lower body clothing?: A Lot 6 Click Score: 15   End of Session Nurse Communication: Mobility status  Activity Tolerance: Patient tolerated treatment well Patient left: in bed;with call bell/phone within reach;with bed alarm set  OT Visit Diagnosis: Unsteadiness on feet (R26.81);Repeated falls (R29.6);Muscle weakness (generalized) (M62.81)                Time: 4650-3546 OT Time Calculation (min): 22 min Charges:  OT General Charges $OT Visit: 1 Visit OT Evaluation $OT Eval Low Complexity: 1 Low OT Treatments $Self Care/Home Management : 8-22 mins  Darleen Crocker, MS, OTR/L , CBIS ascom [phone]  05/01/20, 11:12 AM

## 2020-05-01 NOTE — Progress Notes (Signed)
PT Cancellation Note  Patient Details Name: DELDRICK LINCH MRN: [id number] DOB: 29-Jan-1936   Cancelled Treatment:    Reason Eval/Treat Not Completed: Patient declined, no reason specified. Returned to re-attempt PT session. Patient needing to use bedpan at this time. Will return at later date.    Giang Hemme 05/01/2020, 2:28 PM

## 2020-05-01 NOTE — Telephone Encounter (Signed)
Pt's wife wants to know if Dr. [name] will let the hospital know patient needs a B12 injection. He was scheduled to come in on Monday 05/04/20 but since he is in the hospital and can't walk she wants to know if this can be done there? She said you can reach her on her cell phone.

## 2020-05-01 NOTE — NC FL2 (Signed)
[place] LEVEL OF CARE SCREENING TOOL     IDENTIFICATION  Patient Name: Andre [name] Birthdate: [birth year] Sex: male Admission Date (Current Location): 04/30/2020  [facility] and [place] Number:  Engineering geologist and Address:  [facility], [address] [place], [place] [address]      Provider Number: [id number]  Attending Physician Name and Address:  Donne Hazel, MD  Relative Name and Phone Number:       Current Level of Care: Hospital Recommended Level of Care: [place] Prior Approval Number:    Date Approved/Denied:   PASRR Number: pending  Discharge Plan: SNF    Current Diagnoses: Patient Active Problem List   Diagnosis Date Noted  . Leukocytosis 04/30/2020  . Declining functional status 04/30/2020  . Slurred speech 04/20/2020  . Hypersomnia 04/20/2020  . Coccydynia 11/27/2019  . Rib pain on right side 09/30/2019  . Left shoulder pain 09/30/2019  . Gait abnormality 07/31/2019  . Pulmonary fibrosis ([place]) 07/05/2019  . Elevated glucose 11/09/2018  . Educated about COVID-19 virus infection 11/09/2018  . COPD (chronic obstructive pulmonary disease) ([place]) 07/09/2018  . Hematuria 03/09/2018  . Neck pain 03/09/2018  . Myalgia 03/09/2018  . Decreased hearing of both ears 03/09/2018  . Cervical facet syndrome 01/22/2018  . Muscle cramps 11/02/2017  . Renal cyst 10/02/2017  . Pleural effusion 09/18/2017  . Diarrhea 08/21/2017  . Anemia 08/21/2017  . Renal lesion 07/08/2017  . Fall 05/29/2017  . Lightheadedness 05/29/2017  . Abrasion 02/08/2017  . Anxiety and depression 01/05/2017  . Prediabetes 10/27/2016  . Hypothyroidism 10/27/2016  . (HFpEF) heart failure with preserved ejection fraction ([place]) 09/27/2016  . Hypothyroidism due to medication 09/07/2016  . Parkinson's disease ([place]) 06/30/2016  . Thrombocytopenia ([place]) 01/29/2016  . PAF (paroxysmal atrial fibrillation) ([place])   . BMI 40.0-44.9,  adult ([place]) 05/06/2015  . Erythrocytosis 01/28/2015  . Atherosclerosis of abdominal aorta ([place]) 12/08/2014  . Barrett's esophagus 12/31/2013  . DDD (degenerative disc disease), cervical 10/01/2013  . DDD (degenerative disc disease), lumbar 10/01/2013  . Coronary artery disease involving native coronary artery of native heart with angina pectoris ([place]) 10/30/2012  . GERD (gastroesophageal reflux disease) 10/30/2012  . Hyperlipidemia with target LDL less than 70 10/30/2012  . Essential hypertension 10/30/2012  . Dyspnea 09/10/2012  . Allergic rhinitis 09/10/2012  . OSA (obstructive sleep apnea) 09/10/2012    Orientation RESPIRATION BLADDER Height & Weight     Self,Time,Situation,Place  Normal Continent Weight: 213 lb 13.5 oz (97 kg) Height:  5\' 7"  (170.2 cm)  BEHAVIORAL SYMPTOMS/MOOD NEUROLOGICAL BOWEL NUTRITION STATUS      Continent Diet (heart healthy)  AMBULATORY STATUS COMMUNICATION OF NEEDS Skin   Limited Assist Verbally Normal                       Personal Care Assistance Level of Assistance  Bathing,Feeding,Dressing Bathing Assistance: Limited assistance Feeding assistance: Independent Dressing Assistance: Limited assistance     Functional Limitations Info  Sight,Hearing,Speech Sight Info: Adequate Hearing Info: Adequate Speech Info: Adequate    SPECIAL CARE FACTORS FREQUENCY  PT (By licensed PT),OT (By licensed OT)     PT Frequency: 5x OT Frequency: 5x            Contractures Contractures Info: Not present    Additional Factors Info  Code Status,Allergies Code Status Info: full code Allergies Info: Pravastatin, Prednisone           Current Medications (  05/01/2020):  This is the current hospital active medication list Current Facility-Administered Medications  Medication Dose Route Frequency Provider Last Rate Last Admin  . 0.9 %  sodium chloride infusion   Intravenous Continuous Ivor Costa, MD 75 mL/hr at 04/30/20 1935 New Bag at 04/30/20  1935  . acetaminophen (TYLENOL) tablet 650 mg  650 mg Oral Q6H PRN Ivor Costa, MD      . albuterol (PROVENTIL) (2.5 MG/3ML) 0.083% nebulizer solution 2.5 mg  2.5 mg Nebulization Q4H PRN Ivor Costa, MD      . apixaban Arne [place]) tablet 5 mg  5 mg Oral BID Ivor Costa, MD   5 mg at 05/01/20 0900  . buPROPion (WELLBUTRIN SR) 12 hr tablet 150 mg  150 mg Oral BID Ivor Costa, MD   150 mg at 05/01/20 0900  . carbidopa-levodopa (SINEMET IR) 25-250 MG per tablet immediate release 2 tablet  2 tablet Oral TID Ivor Costa, MD   2 tablet at 05/01/20 0900  . carvedilol (COREG) tablet 6.25 mg  6.25 mg Oral BID WC Ivor Costa, MD   6.25 mg at 05/01/20 0859  . dextromethorphan-guaiFENesin (MUCINEX DM) 30-600 MG per 12 hr tablet 1 tablet  1 tablet Oral BID PRN Ivor Costa, MD      . entacapone (COMTAN) tablet 200 mg  200 mg Oral TID Ivor Costa, MD   200 mg at 05/01/20 0900  . fluticasone (FLONASE) 50 MCG/ACT nasal spray 1 spray  1 spray Each Nare Daily Ivor Costa, MD   1 spray at 05/01/20 0858  . hydrALAZINE (APRESOLINE) injection 5 mg  5 mg Intravenous Q2H PRN Ivor Costa, MD      . HYDROcodone-acetaminophen (NORCO/VICODIN) 5-325 MG per tablet 1 tablet  1 tablet Oral Q6H PRN Ivor Costa, MD      . lactulose ([place]) 10 GM/15ML solution 20 g  20 g Oral BID PRN Ivor Costa, MD      . levothyroxine (SYNTHROID) tablet 50 mcg  50 mcg Oral QAC breakfast Ivor Costa, MD   50 mcg at 05/01/20 0529  . losartan (COZAAR) tablet 100 mg  100 mg Oral Daily Ivor Costa, MD   100 mg at 05/01/20 0900  . ondansetron (ZOFRAN) tablet 4 mg  4 mg Oral Q6H PRN Ivor Costa, MD      . pantoprazole (PROTONIX) EC tablet 40 mg  40 mg Oral Daily Ivor Costa, MD   40 mg at 05/01/20 0859  . potassium chloride SA (KLOR-CON) CR tablet 40 mEq  40 mEq Oral BID Donne Hazel, MD   40 mEq at 05/01/20 0900  . rosuvastatin (CRESTOR) tablet 10 mg  10 mg Oral Daily Ivor Costa, MD   10 mg at 05/01/20 0859  . sertraline (ZOLOFT) tablet 50 mg  50 mg Oral Daily Ivor Costa, MD   50 mg at 05/01/20 0900  . tiotropium (SPIRIVA) inhalation capsule ([facility] use ONLY) 18 mcg  18 mcg Inhalation Daily Ivor Costa, MD   18 mcg at 05/01/20 3893     Discharge Medications: Please see discharge summary for a list of discharge medications.  Relevant Imaging Results:  Relevant Lab Results:   Additional Information TDS:[id number]  Ritesh Opara A Laurent Cargile, LCSW

## 2020-05-01 NOTE — Plan of Care (Signed)
Continuing with plan of care. 

## 2020-05-02 DIAGNOSIS — F419 Anxiety disorder, unspecified: Secondary | ICD-10-CM | POA: Diagnosis not present

## 2020-05-02 DIAGNOSIS — F32A Depression, unspecified: Secondary | ICD-10-CM | POA: Diagnosis not present

## 2020-05-02 LAB — COMPREHENSIVE METABOLIC PANEL
ALT: 8 U/L (ref 0–44)
AST: 16 U/L (ref 15–41)
Albumin: 3.3 g/dL — ABNORMAL LOW (ref 3.5–5.0)
Alkaline Phosphatase: 74 U/L (ref 38–126)
Anion gap: 11 (ref 5–15)
BUN: 16 mg/dL (ref 8–23)
CO2: 23 mmol/L (ref 22–32)
Calcium: 8.4 mg/dL — ABNORMAL LOW (ref 8.9–10.3)
Chloride: 106 mmol/L (ref 98–111)
Creatinine, Ser: 0.85 mg/dL (ref 0.61–1.24)
GFR, Estimated: >60 mL/min (ref >60)
Glucose, Bld: 98 mg/dL (ref 70–99)
Potassium: 4 mmol/L (ref 3.5–5.1)
Sodium: 140 mmol/L (ref 135–145)
Total Bilirubin: 1.5 mg/dL — ABNORMAL HIGH (ref 0.3–1.2)
Total Protein: 6 g/dL — ABNORMAL LOW (ref 6.5–8.1)

## 2020-05-02 LAB — CBC
HCT: 47.4 % (ref 39.0–52.0)
Hemoglobin: 15.2 g/dL (ref 13.0–17.0)
MCH: 28.8 pg (ref 26.0–34.0)
MCHC: 32.1 g/dL (ref 30.0–36.0)
MCV: 89.8 fL (ref 80.0–100.0)
Platelets: 138 10*3/uL — ABNORMAL LOW (ref 150–400)
RBC: 5.28 MIL/uL (ref 4.22–5.81)
RDW: 15.8 % — ABNORMAL HIGH (ref 11.5–15.5)
WBC: 9.9 10*3/uL (ref 4.0–10.5)
nRBC: 0 % (ref 0.0–0.2)

## 2020-05-02 NOTE — Plan of Care (Signed)
Continuing with plan of care. 

## 2020-05-02 NOTE — Progress Notes (Signed)
PROGRESS NOTE    Andre [name]  DJS:[id number] DOB: Apr 20, 1935 DOA: 04/30/2020 PCP: Glori Luis, MD    Brief Narrative:  85 y.o. male with medical history significant of HTN, HLD, COPD, GERD, hypothyroidism, depression, thrombocytopenia, pulmonary fibrosis, PE and atrial fibrillation on Eliquis, Parkinson's disease, CAD, stent placement, dCHF, skin cancer, OSA on CPAP, who presents with fall and weakness.  Per his wife, pt's functional status has been progressively declining since Thanksgiving.  He has generalized weakness, particularly in the past 2 weeks.  Patient has poor appetite, decreased oral intake. Patient has dizziness, slurred speech, and had multiple fall recently.  Patient was seen by his neurologist, and had an MRI of brain on 04/20/2020 which was negative for stroke.  Patient denies chest pain, shortness breath, fever or chills.  He has mild dry cough.  No nausea vomiting, diarrhea, abdominal pain, symptoms of UTI.  ED Course: pt was found to have WBC 12.7, troponin level 6, 6, BNP 154, TSH and Free T4 level normal, negative urinalysis, pending COVID PCR, electrolytes renal function okay, temperature normal, blood pressure, 16/84, heart rate 76, RR 30, oxygen saturation 92-95% on room air.  CT of the head is negative for acute intracranial abnormalities, showed small meningioma in the right temporal lobe.  Chest x-ray showed stable interstitial disease with possible asbestos exposure  Assessment & Plan:   Principal Problem:   Fall Active Problems:   PAF (paroxysmal atrial fibrillation) (HCC)   Parkinson's disease (HCC)   Coronary artery disease involving native coronary artery of native heart with angina pectoris (HCC)   GERD (gastroesophageal reflux disease)   Hyperlipidemia with target LDL less than 70   Essential hypertension   (HFpEF) heart failure with preserved ejection fraction (HCC)   Hypothyroidism   Anxiety and depression   COPD (chronic obstructive  pulmonary disease) (HCC)   Pulmonary fibrosis (HCC)   Leukocytosis   Declining functional status   Declining functional status, multiple falls and generalized weakness:  -Increased weakness likely secondary to dehydration and poor po intake -Presented clinically dehydrated on presentation, given IVF hydration -CT head noted to be unremarkable -TSH and free T4 level normal.  CT head negative for acute intracranial abnormalities. patient had MRI of brain 04/20/2020 which was negative for stroke. -[name] states that she cannot take care of patient at home. -Pending SNF placement per TOC  PAF (paroxysmal atrial fibrillation) (HCC) -Coreg, Eliquis as tolerated -Currently rate controlled  Parkinson's disease (HCC) -Sinemet  Coronary artery disease involving native coronary artery of native heart with angina pectoris (HCC) -continue Crestor  GERD (gastroesophageal reflux disease) -Continue with Protonix  Hyperlipidemia with target LDL less than 70 -Crestor  Essential hypertension -IV hydralazine IV as needed -Coreg, Cozaar -BP currently stable and controlled  (HFpEF) heart failure with preserved ejection fraction (HCC): 2D echo on 08/28/2017 showed EF of 60 deficit 5% with grade 2 diastolic dysfunction.  Patient does not have leg edema, patient seems to have dehydration today. -Lasix on hold given concerns of dehydration -Check BNP --> 154  Hypothyroidism -Synthroid  Anxiety and depression -Continue home medications  COPD (chronic obstructive pulmonary disease) (HCC) and Pulmonary fibrosis (HCC) -Bronchodilators  Leukocytosis: WBC 12.7.  No fever.  No source of infection identified.  Likely reactive -Repeat WBC normalized without needing abx  Constipation -Very good results with cathartics   DVT prophylaxis: Eliquis Code Status: Full Family Communication: Pt in room, wife at bedside  Status is: Observation  The patient remains OBS appropriate  and will d/c before 2 midnights.  Dispo: The patient is from: Home              Anticipated d/c is to: SNF              Anticipated d/c date is: 2 days              Patient currently is not medically stable to d/c.   Consultants:     Procedures:     Antimicrobials: Anti-infectives (From admission, onward)   None      Subjective: Reports feeling better since having large bowel movement yesterday  Objective: Vitals:   05/01/20 2323 05/02/20 0254 05/02/20 0426 05/02/20 1500  BP: 113/71  113/70 129/79  Pulse: 76  81 71  Resp: 18  20   Temp: 97.9 F (36.6 C)  (!) 97.5 F (36.4 C) (!) 97.4 F (36.3 C)  TempSrc: Oral  Oral Oral  SpO2: 91%  95% 96%  Weight:  96 kg    Height:        Intake/Output Summary (Last 24 hours) at 05/02/2020 1644 Last data filed at 05/02/2020 1500 Gross per 24 hour  Intake 397.13 ml  Output 1050 ml  Net -652.87 ml   Filed Weights   04/30/20 0844 05/02/20 0254  Weight: 97 kg 96 kg    Examination: General exam: Awake, laying in bed, in nad Respiratory system: Normal respiratory effort, no wheezing Cardiovascular system: regular rate, s1, s2 Gastrointestinal system: Soft, nondistended, positive BS Central nervous system: CN2-12 grossly intact, strength intact Extremities: Perfused, no clubbing Skin: Normal skin turgor, no notable skin lesions seen Psychiatry: Mood normal // no visual hallucinations   Data Reviewed: I have personally reviewed following labs and imaging studies  CBC: Recent Labs  Lab 04/30/20 0847 05/01/20 0218 05/02/20 0943  WBC 12.7* 9.9 9.9  HGB 16.7 16.0 15.2  HCT 50.9 50.8 47.4  MCV 88.8 90.2 89.8  PLT 164 140* 829*   Basic Metabolic Panel: Recent Labs  Lab 04/30/20 0847 05/01/20 0218 05/02/20 0943  NA 137 141 140  K 3.5 3.2* 4.0  CL 97* 102 106  CO2 26 27 23   GLUCOSE 93 69* 98  BUN 21 19 16   CREATININE 0.96 0.87 0.85  CALCIUM 9.4 8.7* 8.4*   GFR: Estimated Creatinine Clearance: 74 mL/min (by  C-G formula based on SCr of 0.85 mg/dL). Liver Function Tests: Recent Labs  Lab 05/02/20 0943  AST 16  ALT 8  ALKPHOS 74  BILITOT 1.5*  PROT 6.0*  ALBUMIN 3.3*   No results for input(s): LIPASE, AMYLASE in the last 168 hours. No results for input(s): AMMONIA in the last 168 hours. Coagulation Profile: No results for input(s): INR, PROTIME in the last 168 hours. Cardiac Enzymes: Recent Labs  Lab 04/30/20 0847  CKTOTAL 173   BNP (last 3 results) No results for input(s): PROBNP in the last 8760 hours. HbA1C: No results for input(s): HGBA1C in the last 72 hours. CBG: No results for input(s): GLUCAP in the last 168 hours. Lipid Profile: No results for input(s): CHOL, HDL, LDLCALC, TRIG, CHOLHDL, LDLDIRECT in the last 72 hours. Thyroid Function Tests: Recent Labs    04/30/20 1432  TSH 4.325  FREET4 1.10   Anemia Panel: No results for input(s): VITAMINB12, FOLATE, FERRITIN, TIBC, IRON, RETICCTPCT in the last 72 hours. Sepsis Labs: No results for input(s): PROCALCITON, LATICACIDVEN in the last 168 hours.  Recent Results (from the past 240 hour(s))  SARS CORONAVIRUS 2 (TAT  6-24 HRS) Nasopharyngeal Nasopharyngeal Swab     Status: None   Collection Time: 04/30/20  2:28 PM   Specimen: Nasopharyngeal Swab  Result Value Ref Range Status   SARS Coronavirus 2 NEGATIVE NEGATIVE Final    Comment: (NOTE) SARS-CoV-2 target nucleic acids are NOT DETECTED.  The SARS-CoV-2 RNA is generally detectable in upper and lower respiratory specimens during the acute phase of infection. Negative results do not preclude SARS-CoV-2 infection, do not rule out co-infections with other pathogens, and should not be used as the sole basis for treatment or other patient management decisions. Negative results must be combined with clinical observations, patient history, and epidemiological information. The expected result is Negative.  Fact Sheet for  Patients: SugarRoll.be  Fact Sheet for Healthcare Providers: https://www.woods-mathews.com/  This test is not yet approved or cleared by the [place] FDA and  has been authorized for detection and/or diagnosis of SARS-CoV-2 by FDA under an Emergency Use Authorization (EUA). This EUA will remain  in effect (meaning this test can be used) for the duration of the COVID-19 declaration under Se ction 564(b)(1) of the Act, 21 U.S.C. section 360bbb-3(b)(1), unless the authorization is terminated or revoked sooner.  Performed at [facility], [place] [address], [place], [place] [address]      Radiology Studies: No results found.  Scheduled Meds: . apixaban  5 mg Oral BID  . bisacodyl  10 mg Rectal Once  . buPROPion  150 mg Oral BID  . carbidopa-levodopa  2 tablet Oral TID  . carvedilol  6.25 mg Oral BID WC  . entacapone  200 mg Oral TID  . fluticasone  1 spray Each Nare Daily  . levothyroxine  50 mcg Oral QAC breakfast  . losartan  100 mg Oral Daily  . pantoprazole  40 mg Oral Daily  . rosuvastatin  10 mg Oral Daily  . sertraline  50 mg Oral Daily  . tiotropium  18 mcg Inhalation Daily   Continuous Infusions: . sodium chloride 75 mL/hr at 05/02/20 0509     LOS: 0 days   Marylu Lund, MD [facility] Pager On Amion  If 7PM-7AM, please contact night-coverage 05/02/2020, 4:44 PM

## 2020-05-03 DIAGNOSIS — F419 Anxiety disorder, unspecified: Secondary | ICD-10-CM | POA: Diagnosis not present

## 2020-05-03 DIAGNOSIS — I25119 Atherosclerotic heart disease of native coronary artery with unspecified angina pectoris: Secondary | ICD-10-CM | POA: Diagnosis not present

## 2020-05-03 DIAGNOSIS — F32A Depression, unspecified: Secondary | ICD-10-CM | POA: Diagnosis not present

## 2020-05-03 MED ORDER — HALOPERIDOL LACTATE 5 MG/ML IJ SOLN
2.5000 mg | Freq: Once | INTRAMUSCULAR | Status: AC
  Administered 2020-05-03: 2.5 mg via INTRAVENOUS
  Filled 2020-05-03: qty 1

## 2020-05-03 MED ORDER — QUETIAPINE FUMARATE 25 MG PO TABS
25.0000 mg | ORAL_TABLET | Freq: Every day | ORAL | Status: DC
  Administered 2020-05-03 – 2020-05-06 (×4): 25 mg via ORAL
  Filled 2020-05-03 (×4): qty 1

## 2020-05-03 MED ORDER — ENSURE ENLIVE PO LIQD
237.0000 mL | Freq: Three times a day (TID) | ORAL | Status: DC
  Administered 2020-05-03 – 2020-05-07 (×10): 237 mL via ORAL

## 2020-05-03 MED ORDER — HALOPERIDOL LACTATE 5 MG/ML IJ SOLN
5.0000 mg | Freq: Four times a day (QID) | INTRAMUSCULAR | Status: DC | PRN
  Administered 2020-05-03 – 2020-05-06 (×3): 5 mg via INTRAVENOUS
  Filled 2020-05-03 (×3): qty 1

## 2020-05-03 MED ORDER — ADULT MULTIVITAMIN W/MINERALS CH
1.0000 | ORAL_TABLET | Freq: Every day | ORAL | Status: DC
  Administered 2020-05-04 – 2020-05-07 (×4): 1 via ORAL
  Filled 2020-05-03 (×5): qty 1

## 2020-05-03 NOTE — Progress Notes (Signed)
Initial Nutrition Assessment  DOCUMENTATION CODES:   Obesity unspecified  INTERVENTION:   -Ensure Enlive po TID, each supplement provides 350 kcal and 20 grams of protein -Magic cup TID with meals, each supplement provides 290 kcal and 9 grams of protein -MVI with minerals daily -Downgrade diet to dysphagia 3 (advanced mechanical soft) for energy conservation and ease of intake  NUTRITION DIAGNOSIS:   Inadequate oral intake related to decreased appetite as evidenced by meal completion < 25%.  GOAL:   Patient will meet greater than or equal to 90% of their needs  MONITOR:   PO intake,Supplement acceptance,Labs,Weight trends,Skin,I & O's  REASON FOR ASSESSMENT:   Consult Assessment of nutrition requirement/status  ASSESSMENT:   DAEMON DOWTY is a 85 y.o. male with medical history significant of HTN, HLD, COPD, GERD, hypothyroidism, depression, thrombocytopenia, pulmonary fibrosis, PE and atrial fibrillation on Eliquis, Parkinson's disease, CAD, stent placement, dCHF, skin cancer, OSA on CPAP, who presents with fall and weakness.  Pt admitted with declining functional status, multiple falls, and generalized weakness.   Reviewed I/O's: -930 ml x 24 hours and -943 ml since admission  UOP: 1.1 L x 24 hours  Pt unavailable at time of attempted contact.  Per MD notes, pt has experienced a general decline in health since Thanksgiving, including poor appetite, decreased oral intake, and multiple falls.   Pt with very poor intake. Noted meal completion 0%.   Reviewed wt loss; pt has experienced a 3.7% wt loss over the past 4 months, which is not significant for time frame.   Pt would greatly benefit from addition of oral nutrition supplements. He is at high risk for malnutrition, however, unable to identify at this time.   Per TOC notes, plan to d/c to SNF once medically stable.   Labs reviewed.   Diet Order:   Diet Order            Diet Heart Room service appropriate?  Yes; Fluid consistency: Thin  Diet effective now                 EDUCATION NEEDS:   No education needs have been identified at this time  Skin:  Skin Assessment: Reviewed RN Assessment  Last BM:  05/01/20  Height:   Ht Readings from Last 1 Encounters:  04/30/20 5\' 7"  (1.702 m)    Weight:   Wt Readings from Last 1 Encounters:  05/02/20 96 kg    Ideal Body Weight:  67.3 kg  BMI:  Body mass index is 33.15 kg/m.  Estimated Nutritional Needs:   Kcal:  1800-2000  Protein:  90-105 grams  Fluid:  > 1.8 L    Loistine Chance, RD, LDN, [place] Registered Dietitian II Certified Diabetes Care and Education Specialist Please refer to [facility] for RD and/or RD on-call/weekend/after hours pager

## 2020-05-03 NOTE — Plan of Care (Signed)
Continuing with plan of care. 

## 2020-05-03 NOTE — Progress Notes (Signed)
Patient has removed 3 IV's within the last 12 hours.  On call, Sharion Settler, NP aware of no current IV access.

## 2020-05-03 NOTE — Progress Notes (Signed)
Earlier in the shift patient was pulling at IV lines and not allowing fluids to be administered.  Dr. Wyline Copas aware of patient refusal.

## 2020-05-03 NOTE — Progress Notes (Signed)
Andre [name], Andre DOA: 04/30/2020 PCP: [name], Andre    Brief Narrative:  85 y.o. male with medical history significant of [name], Andre [name], Andre [name], Andre [name], Andre [name], Andre [name], Andre [name], Andre [name], Andre [name], Andre [name], Andre [name], stent placement, dCHF, skin cancer, OSA on CPAP, who presents with fall and weakness.  Per his wife, pt's functional status has been progressively declining since Thanksgiving.  He has generalized weakness, particularly in the past 2 weeks.  Patient has poor appetite, decreased oral intake. Patient has dizziness, slurred speech, and had multiple fall recently.  Patient was seen by his neurologist, and had an MRI of brain on 04/20/2020 which was negative for stroke.  Patient denies chest pain, shortness breath, fever or chills.  He has mild dry cough.  No nausea vomiting, diarrhea, abdominal pain, symptoms of UTI.  ED Course: pt was found to have WBC 12.7, troponin level 6, 6, BNP 154, TSH and Free T4 level normal, negative urinalysis, pending COVID PCR, electrolytes renal function okay, temperature normal, blood pressure, 16/84, heart rate 76, RR 30, oxygen saturation 92-95% on room air.  CT of the head is negative for acute intracranial abnormalities, showed small meningioma in the right temporal lobe.  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)   Andre [name] (gastroesophageal reflux [name])   Hyperlipidemia with target LDL less than 70   Essential hypertension   (HFpEF) heart failure with preserved ejection fraction (HCC)   Andre [name]   Anxiety and Andre [name]   Andre [name] (chronic obstructive  Andre [name]) (HCC)   Andre [name] (HCC)   Leukocytosis   Declining functional status   Declining functional status, multiple falls and generalized weakness:  -Increased weakness likely secondary to dehydration and poor po intake -Presented clinically dehydrated on presentation, given IVF hydration -CT head noted to be unremarkable -TSH and free T4 level normal.  CT head negative for acute intracranial abnormalities. patient had MRI of brain 04/20/2020 which was negative for stroke. -[name] states that she cannot take care of patient at home. 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  Patient does not have leg edema, patient seems to have dehydration today. -Lasix on hold given concerns of dehydration -Check BNP --> 154  Andre [name] -Continue with Synthroid as tolerated  Anxiety and Andre [name] -Continue home medications  Andre [name] (chronic obstructive Andre [name]) (HCC) and Andre [name] (HCC) -Bronchodilators  Leukocytosis: Presenting WBC 12.7.  No fever.  No source of infection identified.  Likely reactive. WBC has since normalized -Repeat WBC normalized without needing abx  Constipation -Very good results with cathartics  Dementia -very confused overnight and this AM, requiring antipsychotic -Reportedly did not sleep  last night -Will  given trial of qhs seroquel -have ordered PRN haldol   DVT prophylaxis: [name] Code Status: Full Family Communication: Pt in room, wife at bedside  Status is: Observation  The patient remains OBS appropriate and will d/c before 2 midnights.  Dispo: The patient is from: Home              Anticipated d/c is to: SNF              Anticipated d/c date is: 2 days              Patient currently is not medically stable to d/c.   Consultants:     Procedures:     Antimicrobials: Anti-infectives (From admission, onward)   None      Subjective: Confused this AM, difficult to assess  Objective: Vitals:   05/02/20 1500 05/02/20 2033 05/03/20 0258 05/03/20 0258  BP: 129/79 106/66 137/70   Pulse: 71 76 (!) 110 (!) 109  Resp:  18 20   Temp: (!) 97.4 F (36.3 C) 98.1 F (36.7 C) 97.7 F (36.5 C)   TempSrc: Oral  Oral   SpO2: 96% 95% 95% 96%  Weight:      Height:        Intake/Output Summary (Last 24 hours) at 05/03/2020 1324 Last data filed at 05/03/2020 0900 Gross per 24 hour  Intake 0 ml  Output 1050 ml  Net -1050 ml   Filed Weights   04/30/20 0844 05/02/20 0254  Weight: 97 kg 96 kg    Examination: General exam: Awake, laying in bed, in nad Respiratory system: Normal respiratory effort, no wheezing Cardiovascular system: regular rate, s1, s2 Gastrointestinal system: Soft, nondistended, positive BS Central nervous system: CN2-12 grossly intact, strength intact Extremities: Perfused, no clubbing Skin: Normal skin turgor, no notable skin lesions seen Psychiatry: Mood normal // no visual hallucinations   Data Reviewed: I have personally reviewed following labs and imaging studies  CBC: Recent Labs  Lab 04/30/20 0847 05/01/20 0218 05/02/20 0943  WBC 12.7* 9.9 9.9  HGB 16.7 16.0 15.2  HCT 50.9 50.8 47.4  MCV 88.8 90.2 89.8  PLT 164 140* 500*   Basic Metabolic Panel: Recent Labs  Lab 04/30/20 0847 05/01/20 0218 05/02/20 0943  NA  137 141 140  K 3.5 3.2* 4.0  CL 97* 102 106  CO2 26 27 23   GLUCOSE 93 69* 98  BUN 21 19 16   CREATININE 0.96 0.87 0.85  CALCIUM 9.4 8.7* 8.4*   GFR: Estimated Creatinine Clearance: 74 mL/min (by C-G formula based on SCr of 0.85 mg/dL). Liver Function Tests: Recent Labs  Lab 05/02/20 0943  AST 16  ALT 8  ALKPHOS 74  BILITOT 1.5*  PROT 6.0*  ALBUMIN 3.3*   No results for input(s): LIPASE, AMYLASE in the last 168 hours. No results for input(s): AMMONIA in the last 168 hours. Coagulation Profile: No results for input(s): INR, PROTIME in the last 168 hours. Cardiac Enzymes: Recent Labs  Lab 04/30/20 0847  CKTOTAL 173   BNP (last 3 results) No results for input(s): PROBNP in the last 8760 hours. HbA1C: No results for input(s): HGBA1C in the last 72 hours. CBG: No results for input(s): GLUCAP in the last 168 hours. Lipid Profile: No results for input(s): CHOL, HDL, LDLCALC, TRIG, CHOLHDL, LDLDIRECT in the last 72 hours. Thyroid Function Tests: Recent Labs    04/30/20 1432  TSH 4.325  FREET4 1.10   Anemia Panel: No results for input(s): VITAMINB12, FOLATE, FERRITIN, TIBC, IRON,  RETICCTPCT in the last 72 hours. Sepsis Labs: No results for input(s): PROCALCITON, LATICACIDVEN in the last 168 hours.  Recent Results (from the past 240 hour(s))  SARS CORONAVIRUS 2 (TAT 6-24 HRS) Nasopharyngeal Nasopharyngeal Swab     Status: None   Collection Time: 04/30/20  2:28 PM   Specimen: Nasopharyngeal Swab  Result Value Ref Range Status   SARS Coronavirus 2 NEGATIVE NEGATIVE Final    Comment: (NOTE) SARS-CoV-2 target nucleic acids are NOT DETECTED.  The SARS-CoV-2 RNA is generally detectable in upper and lower respiratory specimens during the acute phase of infection. Negative results do not preclude SARS-CoV-2 infection, do not rule out co-infections with other pathogens, and should not be used as the sole basis for treatment or other patient management decisions. Negative  results must be combined with clinical observations, patient history, and epidemiological information. The expected result is Negative.  Fact Sheet for Patients: SugarRoll.be  Fact Sheet for Healthcare Providers: https://www.woods-mathews.com/  This test is not yet approved or cleared by the [place] FDA and  has been authorized for detection and/or diagnosis of SARS-CoV-2 by FDA under an Emergency Use Authorization (EUA). This EUA will remain  in effect (meaning this test can be used) for the duration of the COVID-19 declaration under Se ction 564(b)(1) of the Act, 21 U.S.C. section 360bbb-3(b)(1), unless the authorization is terminated or revoked sooner.  Performed at [facility], [place] [address], [place], [place] [address]      Radiology Studies: No results found.  Scheduled Meds: . apixaban  5 mg Oral BID  . bisacodyl  10 mg Rectal Once  . buPROPion  150 mg Oral BID  . carbidopa-levodopa  2 tablet Oral TID  . carvedilol  6.25 mg Oral BID WC  . entacapone  200 mg Oral TID  . feeding supplement  237 mL Oral TID BM  . fluticasone  1 spray Each Nare Daily  . levothyroxine  50 mcg Oral QAC breakfast  . losartan  100 mg Oral Daily  . multivitamin with minerals  1 tablet Oral Daily  . pantoprazole  40 mg Oral Daily  . QUEtiapine  25 mg Oral QHS  . rosuvastatin  10 mg Oral Daily  . sertraline  50 mg Oral Daily  . tiotropium  18 mcg Inhalation Daily   Continuous Infusions: . sodium chloride 75 mL/hr at 05/02/20 2100     LOS: 0 days   Marylu Lund, Andre [facility] Pager On Amion  If 7PM-7AM, please contact night-coverage 05/03/2020, 1:24 PM

## 2020-05-03 NOTE — Progress Notes (Addendum)
Physical Therapy Treatment Patient Details Name: Andre [name] MRN: [id number] DOB: [birth year] Today's Date: 05/03/2020    History of Present Illness Pt to ED from home via [facility] for chief complaint of weakness x2 weeks with multiple falls. Pt with slurred speech, per EMS this is normal   Pt reports dizziness and weakness, denies pain. PMH includes: parkinsons, PE, PAF, HTN, HLD, GERD, CAD, depression, CHF    PT Comments    Nursing found pt up walking in room on his own this am.  At nursing station for safety.  He remains confused but is able to stand with min guard and progress gait 80' with RW and min guard/assist.  Limited by fatigue.  While he has no LOB gait remains unsteady and should have +1 assist at all times. Pt declines further activity at this time. SNF remains appropriate as pt is not at baseline.     Follow Up Recommendations  SNF;Supervision for mobility/OOB     Equipment Recommendations  None recommended by PT;Other (comment) (TBD)    Recommendations for Other Services       Precautions / Restrictions Precautions Precautions: Fall    Mobility  Bed Mobility               General bed mobility comments: OOB on his own this am walking in room per nursing.  at nursing station  Transfers Overall transfer level: Needs assistance Equipment used: Rolling walker (2 wheeled) Transfers: Sit to/from Stand Sit to Stand: Min guard            Ambulation/Gait Ambulation/Gait assistance: Herbalist (Feet): 80 Feet Assistive device: Rolling walker (2 wheeled) Gait Pattern/deviations: Step-through pattern;Decreased step length - right;Decreased step length - left Gait velocity: decr   General Gait Details: short steps generally unsteady but no LOB   Stairs             Wheelchair Mobility    Modified Rankin (Stroke Patients Only)       Balance Overall balance assessment: Needs assistance Sitting-balance support: Feet  unsupported;Bilateral upper extremity supported Sitting balance-Leahy Scale: Good     Standing balance support: Bilateral upper extremity supported;During functional activity Standing balance-Leahy Scale: Fair Standing balance comment: UE support for balance                            Cognition Arousal/Alertness: Awake/alert Behavior During Therapy: WFL for tasks assessed/performed;Agitated Overall Cognitive Status: No family/caregiver present to determine baseline cognitive functioning                                        Exercises      General Comments        Pertinent Vitals/Pain Pain Assessment: No/denies pain    Home Living                      Prior Function            PT Goals (current goals can now be found in the care plan section) Progress towards PT goals: Progressing toward goals    Frequency    Min 2X/week      PT Plan      Co-evaluation              AM-PAC PT "6 Clicks" Mobility   Outcome Measure  Help needed turning  from your back to your side while in a flat bed without using bedrails?: None Help needed moving from lying on your back to sitting on the side of a flat bed without using bedrails?: A Little Help needed moving to and from a bed to a chair (including a wheelchair)?: A Little Help needed standing up from a chair using your arms (e.g., wheelchair or bedside chair)?: A Little Help needed to walk in hospital room?: A Little Help needed climbing 3-5 steps with a railing? : A Little 6 Click Score: 19    End of Session Equipment Utilized During Treatment: Gait belt Activity Tolerance: Patient limited by fatigue Patient left: in bed;with family/visitor present Nurse Communication: Mobility status PT Visit Diagnosis: Muscle weakness (generalized) (M62.81);Unsteadiness on feet (R26.81);Other abnormalities of gait and mobility (R26.89);Difficulty in walking, not elsewhere classified  (R26.2);Repeated falls (R29.6)     Time: 6759-1638 PT Time Calculation (min) (ACUTE ONLY): 12 min  Charges:  $Gait Training: 8-22 mins                    Chesley Noon, PTA 05/03/20, 8:53 AM

## 2020-05-04 ENCOUNTER — Ambulatory Visit: Payer: PPO

## 2020-05-04 DIAGNOSIS — F419 Anxiety disorder, unspecified: Secondary | ICD-10-CM | POA: Diagnosis not present

## 2020-05-04 DIAGNOSIS — F32A Depression, unspecified: Secondary | ICD-10-CM | POA: Diagnosis not present

## 2020-05-04 DIAGNOSIS — I25119 Atherosclerotic heart disease of native coronary artery with unspecified angina pectoris: Secondary | ICD-10-CM | POA: Diagnosis not present

## 2020-05-04 LAB — CBC
HCT: 46 % (ref 39.0–52.0)
Hemoglobin: 15 g/dL (ref 13.0–17.0)
MCH: 29.1 pg (ref 26.0–34.0)
MCHC: 32.6 g/dL (ref 30.0–36.0)
MCV: 89.1 fL (ref 80.0–100.0)
Platelets: 132 10*3/uL — ABNORMAL LOW (ref 150–400)
RBC: 5.16 MIL/uL (ref 4.22–5.81)
RDW: 15.5 % (ref 11.5–15.5)
WBC: 8.3 10*3/uL (ref 4.0–10.5)
nRBC: 0 % (ref 0.0–0.2)

## 2020-05-04 LAB — COMPREHENSIVE METABOLIC PANEL
ALT: <5 U/L (ref 0–44)
AST: 13 U/L — ABNORMAL LOW (ref 15–41)
Albumin: 3.3 g/dL — ABNORMAL LOW (ref 3.5–5.0)
Alkaline Phosphatase: 74 U/L (ref 38–126)
Anion gap: 9 (ref 5–15)
BUN: 12 mg/dL (ref 8–23)
CO2: 27 mmol/L (ref 22–32)
Calcium: 8.5 mg/dL — ABNORMAL LOW (ref 8.9–10.3)
Chloride: 105 mmol/L (ref 98–111)
Creatinine, Ser: 0.86 mg/dL (ref 0.61–1.24)
GFR, Estimated: >60 mL/min (ref >60)
Glucose, Bld: 77 mg/dL (ref 70–99)
Potassium: 3.4 mmol/L — ABNORMAL LOW (ref 3.5–5.1)
Sodium: 141 mmol/L (ref 135–145)
Total Bilirubin: 1.7 mg/dL — ABNORMAL HIGH (ref 0.3–1.2)
Total Protein: 6.1 g/dL — ABNORMAL LOW (ref 6.5–8.1)

## 2020-05-04 MED ORDER — POTASSIUM CHLORIDE CRYS ER 20 MEQ PO TBCR
60.0000 meq | EXTENDED_RELEASE_TABLET | Freq: Once | ORAL | Status: AC
  Administered 2020-05-04: 60 meq via ORAL
  Filled 2020-05-04: qty 3

## 2020-05-04 NOTE — Progress Notes (Signed)
PROGRESS NOTE    Andre [name]  DPO:[id number] DOB: May 07, 1935 DOA: 04/30/2020 PCP: [name], MD    Brief Narrative:  85 y.o. male with medical history significant of HTN, HLD, COPD, GERD, hypothyroidism, depression, thrombocytopenia, pulmonary fibrosis, PE and atrial fibrillation on Eliquis, Parkinson's disease, CAD, stent placement, dCHF, skin cancer, OSA on CPAP, who presents with fall and weakness.  Per his wife, pt's functional status has been progressively declining since Thanksgiving.  He has generalized weakness, particularly in the past 2 weeks.  Patient has poor appetite, decreased oral intake. Patient has dizziness, slurred speech, and had multiple fall recently.  Patient was seen by his neurologist, and had an MRI of brain on 04/20/2020 which was negative for stroke.  Patient denies chest pain, shortness breath, fever or chills.  He has mild dry cough.  No nausea vomiting, diarrhea, abdominal pain, symptoms of UTI.  ED Course: pt was found to have WBC 12.7, troponin level 6, 6, BNP 154, TSH and Free T4 level normal, negative urinalysis, pending COVID PCR, electrolytes renal function okay, temperature normal, blood pressure, 16/84, heart rate 76, RR 30, oxygen saturation 92-95% on room air.  CT of the head is negative for acute intracranial abnormalities, showed small meningioma in the right temporal lobe.  Chest x-ray showed stable interstitial disease with possible asbestos exposure  Assessment & Plan:   Principal Problem:   Fall Active Problems:   PAF (paroxysmal atrial fibrillation) (HCC)   Parkinson's disease ([place])   Coronary artery disease involving native coronary artery of native heart with angina pectoris (HCC)   GERD (gastroesophageal reflux disease)   Hyperlipidemia with target LDL less than 70   Essential hypertension   (HFpEF) heart failure with preserved ejection fraction (HCC)   Hypothyroidism   Anxiety and depression   COPD (chronic obstructive  pulmonary disease) (HCC)   Pulmonary fibrosis (HCC)   Leukocytosis   Declining functional status   Declining functional status, multiple falls and generalized weakness:  -Increased weakness likely secondary to dehydration and poor po intake -Presented clinically dehydrated on presentation, given IVF hydration -CT head noted to be unremarkable -TSH and free T4 level normal.  CT head negative for acute intracranial abnormalities. patient had MRI of brain 04/20/2020 which was negative for stroke. -[name] states that she cannot take care of patient at home. -TOC following for SNF placement, pending  PAF (paroxysmal atrial fibrillation) (HCC) -Coreg, Eliquis as tolerated -Currently rate controlled  Parkinson's disease ([place]) -Sinemet  Coronary artery disease involving native coronary artery of native heart with angina pectoris (HCC) -continue Crestor  GERD (gastroesophageal reflux disease) -Continue with Protonix as tolerated  Hyperlipidemia with target LDL less than 70 -Crestor  Essential hypertension -IV hydralazine IV as needed -Coreg, Cozaar -BP currently stable and controlled  (HFpEF) heart failure with preserved ejection fraction ([place]): 2D echo on 08/28/2017 showed EF of 60 deficit 5% with grade 2 diastolic dysfunction.  Patient does not have leg edema, appeared dehydrated initially -Lasix on hold given concerns of dehydration -Check BNP --> 154 -will check daily wts, repeat bmet in AM  Hypothyroidism -Continue with Synthroid as tolerated  Anxiety and depression -Continue home medications  COPD (chronic obstructive pulmonary disease) ([place]) and Pulmonary fibrosis (HCC) -Bronchodilators  Leukocytosis: Presenting WBC 12.7.  No fever.  No source of infection identified.  Likely reactive. WBC has since normalized -Repeat WBC normalized without needing abx  Constipation -Very good results with cathartics  Dementia -very confused recently, requiring  antipsychotic -receiving trial  of qhs seroquel -continue with PRN haldol   DVT prophylaxis: Eliquis Code Status: Full Family Communication: Pt in room, wife at bedside  Status is: Observation  The patient remains OBS appropriate and will d/c before 2 midnights.  Dispo: The patient is from: Home              Anticipated d/c is to: SNF              Anticipated d/c date is: 2 days              Patient currently is not medically stable to d/c.   Consultants:     Procedures:     Antimicrobials: Anti-infectives (From admission, onward)   None      Subjective: Remains somewhat confused, tired appearing  Objective: Vitals:   05/03/20 2321 05/03/20 2335 05/04/20 0433 05/04/20 0820  BP: 123/74 (!) 122/59 (!) 144/83 134/63  Pulse: 74 72 80 79  Resp: 16 20 20 18   Temp: 98.4 F (36.9 C) 97.7 F (36.5 C) 98.1 F (36.7 C) (!) 97.5 F (36.4 C)  TempSrc:  Oral  Oral  SpO2: 95% 96% 95% 96%  Weight:      Height:        Intake/Output Summary (Last 24 hours) at 05/04/2020 1548 Last data filed at 05/04/2020 0830 Gross per 24 hour  Intake 0 ml  Output 250 ml  Net -250 ml   Filed Weights   04/30/20 0844 05/02/20 0254  Weight: 97 kg 96 kg    Examination: General exam: Conversant, in no acute distress Respiratory system: normal chest rise, clear, no audible wheezing Cardiovascular system: regular rhythm, s1-s2 Gastrointestinal system: Nondistended, nontender, pos BS Central nervous system: No seizures, no tremors Extremities: No cyanosis, no joint deformities Skin: No rashes, no pallor Psychiatry: Affect normal // no auditory hallucinations   Data Reviewed: I have personally reviewed following labs and imaging studies  CBC: Recent Labs  Lab 04/30/20 0847 05/01/20 0218 05/02/20 0943 05/04/20 0603  WBC 12.7* 9.9 9.9 8.3  HGB 16.7 16.0 15.2 15.0  HCT 50.9 50.8 47.4 46.0  MCV 88.8 90.2 89.8 89.1  PLT 164 140* 138* [id number]*   Basic Metabolic Panel: Recent Labs   Lab 04/30/20 0847 05/01/20 0218 05/02/20 0943 05/04/20 0603  NA 137 141 140 141  K 3.5 3.2* 4.0 3.4*  CL 97* 102 106 105  CO2 26 27 23 27   GLUCOSE 93 69* 98 77  BUN 21 19 16 12   CREATININE 0.96 0.87 0.85 0.86  CALCIUM 9.4 8.7* 8.4* 8.5*   GFR: Estimated Creatinine Clearance: 73.2 mL/min (by C-G formula based on SCr of 0.86 mg/dL). Liver Function Tests: Recent Labs  Lab 05/02/20 0943 05/04/20 0603  AST 16 13*  ALT 8 <5  ALKPHOS 74 74  BILITOT 1.5* 1.7*  PROT 6.0* 6.1*  ALBUMIN 3.3* 3.3*   No results for input(s): LIPASE, AMYLASE in the last 168 hours. No results for input(s): AMMONIA in the last 168 hours. Coagulation Profile: No results for input(s): INR, PROTIME in the last 168 hours. Cardiac Enzymes: Recent Labs  Lab 04/30/20 0847  CKTOTAL 173   BNP (last 3 results) No results for input(s): PROBNP in the last 8760 hours. HbA1C: No results for input(s): HGBA1C in the last 72 hours. CBG: No results for input(s): GLUCAP in the last 168 hours. Lipid Profile: No results for input(s): CHOL, HDL, LDLCALC, TRIG, CHOLHDL, LDLDIRECT in the last 72 hours. Thyroid Function Tests: No results for input(s):  TSH, T4TOTAL, FREET4, T3FREE, THYROIDAB in the last 72 hours. Anemia Panel: No results for input(s): VITAMINB12, FOLATE, FERRITIN, TIBC, IRON, RETICCTPCT in the last 72 hours. Sepsis Labs: No results for input(s): PROCALCITON, LATICACIDVEN in the last 168 hours.  Recent Results (from the past 240 hour(s))  SARS CORONAVIRUS 2 (TAT 6-24 HRS) Nasopharyngeal Nasopharyngeal Swab     Status: None   Collection Time: 04/30/20  2:28 PM   Specimen: Nasopharyngeal Swab  Result Value Ref Range Status   SARS Coronavirus 2 NEGATIVE NEGATIVE Final    Comment: (NOTE) SARS-CoV-2 target nucleic acids are NOT DETECTED.  The SARS-CoV-2 RNA is generally detectable in upper and lower respiratory specimens during the acute phase of infection. Negative results do not preclude  SARS-CoV-2 infection, do not rule out co-infections with other pathogens, and should not be used as the sole basis for treatment or other patient management decisions. Negative results must be combined with clinical observations, patient history, and epidemiological information. The expected result is Negative.  Fact Sheet for Patients: SugarRoll.be  Fact Sheet for Healthcare Providers: https://www.woods-mathews.com/  This test is not yet approved or cleared by the [place] FDA and  has been authorized for detection and/or diagnosis of SARS-CoV-2 by FDA under an Emergency Use Authorization (EUA). This EUA will remain  in effect (meaning this test can be used) for the duration of the COVID-19 declaration under Se ction 564(b)(1) of the Act, 21 U.S.C. section 360bbb-3(b)(1), unless the authorization is terminated or revoked sooner.  Performed at [facility], [place] [address] [place], [place] [address]      Radiology Studies: No results found.  Scheduled Meds: . apixaban  5 mg Oral BID  . bisacodyl  10 mg Rectal Once  . buPROPion  150 mg Oral BID  . carbidopa-levodopa  2 tablet Oral TID  . carvedilol  6.25 mg Oral BID WC  . entacapone  200 mg Oral TID  . feeding supplement  237 mL Oral TID BM  . fluticasone  1 spray Each Nare Daily  . levothyroxine  50 mcg Oral QAC breakfast  . losartan  100 mg Oral Daily  . multivitamin with minerals  1 tablet Oral Daily  . pantoprazole  40 mg Oral Daily  . QUEtiapine  25 mg Oral QHS  . rosuvastatin  10 mg Oral Daily  . sertraline  50 mg Oral Daily  . tiotropium  18 mcg Inhalation Daily   Continuous Infusions: . sodium chloride 75 mL/hr at 05/02/20 2100     LOS: 0 days   Marylu Lund, MD [facility] Pager On Amion  If 7PM-7AM, please contact night-coverage 05/04/2020, 3:48 PM

## 2020-05-05 ENCOUNTER — Telehealth: Payer: Self-pay | Admitting: Neurology

## 2020-05-05 DIAGNOSIS — Z951 Presence of aortocoronary bypass graft: Secondary | ICD-10-CM | POA: Diagnosis not present

## 2020-05-05 DIAGNOSIS — Z888 Allergy status to other drugs, medicaments and biological substances status: Secondary | ICD-10-CM | POA: Diagnosis not present

## 2020-05-05 DIAGNOSIS — K219 Gastro-esophageal reflux disease without esophagitis: Secondary | ICD-10-CM | POA: Diagnosis present

## 2020-05-05 DIAGNOSIS — F32A Depression, unspecified: Secondary | ICD-10-CM | POA: Diagnosis present

## 2020-05-05 DIAGNOSIS — E860 Dehydration: Secondary | ICD-10-CM | POA: Diagnosis present

## 2020-05-05 DIAGNOSIS — I10 Essential (primary) hypertension: Secondary | ICD-10-CM | POA: Diagnosis not present

## 2020-05-05 DIAGNOSIS — R296 Repeated falls: Secondary | ICD-10-CM | POA: Diagnosis present

## 2020-05-05 DIAGNOSIS — F0280 Dementia in other diseases classified elsewhere without behavioral disturbance: Secondary | ICD-10-CM | POA: Diagnosis present

## 2020-05-05 DIAGNOSIS — J8410 Pulmonary fibrosis, unspecified: Secondary | ICD-10-CM | POA: Diagnosis present

## 2020-05-05 DIAGNOSIS — G20 Parkinson's disease: Secondary | ICD-10-CM | POA: Diagnosis present

## 2020-05-05 DIAGNOSIS — W19XXXA Unspecified fall, initial encounter: Secondary | ICD-10-CM | POA: Diagnosis present

## 2020-05-05 DIAGNOSIS — F419 Anxiety disorder, unspecified: Secondary | ICD-10-CM | POA: Diagnosis present

## 2020-05-05 DIAGNOSIS — Z85828 Personal history of other malignant neoplasm of skin: Secondary | ICD-10-CM | POA: Diagnosis not present

## 2020-05-05 DIAGNOSIS — I110 Hypertensive heart disease with heart failure: Secondary | ICD-10-CM | POA: Diagnosis present

## 2020-05-05 DIAGNOSIS — E876 Hypokalemia: Secondary | ICD-10-CM | POA: Diagnosis present

## 2020-05-05 DIAGNOSIS — K5900 Constipation, unspecified: Secondary | ICD-10-CM | POA: Diagnosis present

## 2020-05-05 DIAGNOSIS — I5032 Chronic diastolic (congestive) heart failure: Secondary | ICD-10-CM | POA: Diagnosis present

## 2020-05-05 DIAGNOSIS — G4733 Obstructive sleep apnea (adult) (pediatric): Secondary | ICD-10-CM | POA: Diagnosis present

## 2020-05-05 DIAGNOSIS — I25119 Atherosclerotic heart disease of native coronary artery with unspecified angina pectoris: Secondary | ICD-10-CM | POA: Diagnosis present

## 2020-05-05 DIAGNOSIS — D696 Thrombocytopenia, unspecified: Secondary | ICD-10-CM | POA: Diagnosis present

## 2020-05-05 DIAGNOSIS — J449 Chronic obstructive pulmonary disease, unspecified: Secondary | ICD-10-CM | POA: Diagnosis present

## 2020-05-05 DIAGNOSIS — E039 Hypothyroidism, unspecified: Secondary | ICD-10-CM | POA: Diagnosis present

## 2020-05-05 DIAGNOSIS — E785 Hyperlipidemia, unspecified: Secondary | ICD-10-CM | POA: Diagnosis present

## 2020-05-05 DIAGNOSIS — I480 Paroxysmal atrial fibrillation: Secondary | ICD-10-CM | POA: Diagnosis present

## 2020-05-05 DIAGNOSIS — Z20822 Contact with and (suspected) exposure to covid-19: Secondary | ICD-10-CM | POA: Diagnosis present

## 2020-05-05 DIAGNOSIS — R5381 Other malaise: Secondary | ICD-10-CM | POA: Diagnosis not present

## 2020-05-05 DIAGNOSIS — R531 Weakness: Secondary | ICD-10-CM | POA: Diagnosis present

## 2020-05-05 DIAGNOSIS — D72829 Elevated white blood cell count, unspecified: Secondary | ICD-10-CM | POA: Diagnosis present

## 2020-05-05 LAB — COMPREHENSIVE METABOLIC PANEL
ALT: <5 U/L (ref 0–44)
AST: 11 U/L — ABNORMAL LOW (ref 15–41)
Albumin: 3.4 g/dL — ABNORMAL LOW (ref 3.5–5.0)
Alkaline Phosphatase: 76 U/L (ref 38–126)
Anion gap: 10 (ref 5–15)
BUN: 17 mg/dL (ref 8–23)
CO2: 25 mmol/L (ref 22–32)
Calcium: 9 mg/dL (ref 8.9–10.3)
Chloride: 108 mmol/L (ref 98–111)
Creatinine, Ser: 0.83 mg/dL (ref 0.61–1.24)
GFR, Estimated: >60 mL/min (ref >60)
Glucose, Bld: 87 mg/dL (ref 70–99)
Potassium: 4 mmol/L (ref 3.5–5.1)
Sodium: 143 mmol/L (ref 135–145)
Total Bilirubin: 1.7 mg/dL — ABNORMAL HIGH (ref 0.3–1.2)
Total Protein: 6.3 g/dL — ABNORMAL LOW (ref 6.5–8.1)

## 2020-05-05 NOTE — Telephone Encounter (Signed)
Called and talked with the wife. The patient was admitted to the hospital on 04/30/2020. Plans are to transfer him to a rehab facility. For the first several days in the hospital he was having a lot of troubles with confusion and agitation. He is doing better more recently. They are trying to get him out to [facility] facility.

## 2020-05-05 NOTE — Telephone Encounter (Signed)
Pt's wife [name] on [place] called and LVM wanting to inform the provider that the pt is in the hospital at this time due to continuously having falls. Wife states that they are going to refer him to a nursing  facility for rehab. Please call wife for more information.

## 2020-05-05 NOTE — TOC Progression Note (Addendum)
Transition of Care [facility]) - Progression Note    Patient Details  Name: Andre [name] MRN: [id number] Date of Birth: December 26, 1935  Transition of Care [facility]) CM/SW [place], LCSW Phone Number: 05/05/2020, 8:45 AM  Clinical Narrative: No bed offers for this morning. Per TOC notes/handoff, [facility] is first preference. Left message for admissions coordinator asking her to review referral. Referral was previously also sent to [facility] and [facility], neither of which have responded. Sent updated therapy notes to these facilities to try and trigger a response. Also sent referral to [facility] to review.    11:19 am: Per RN, she received report that patient had some behaviors yesterday but is unsure what they were. No behaviors overnight or today. RN said he is AOx3 today. She will have him show her that he can put his cpap on and take it off on his own per [facility] request.   1:45 pm: Called and updated wife. Notified her that he will need to bring his cpap machine from home.   4:10 pm: Per RN, patient is able to get the cpap mask on but unable to clip it. [facility] admissions coordinator is aware and said as long as patient can do most of the work they should be able to accept him. They will not have a bed until Thursday. No other bed offers at this time. Sent today's OT note to facilities to try and get a response.  Expected Discharge Plan: [place] Barriers to Discharge: Continued Medical Work up  Expected Discharge Plan and Services Expected Discharge Plan: [place] In-house Referral: Clinical Social Work   Post Acute Care Choice: [facility] Living arrangements for the past 2 months: Single Family Home                                       Social Determinants of Health (SDOH) Interventions    Readmission Risk Interventions No flowsheet data found.

## 2020-05-05 NOTE — Progress Notes (Signed)
Mobility Specialist - Progress Note   05/05/20 1700  Mobility  Activity Ambulated to bathroom  Level of Assistance Contact guard assist, steadying assist  Assistive Device Front wheel walker  Distance Ambulated (ft) 25 ft  Mobility Response Tolerated well  Mobility performed by Mobility specialist  $Mobility charge 1 Mobility    Pt was trying to get out of bed upon arrival. Mobility responded to bed alarm. Pt attempting to get to bathroom. Pt mildly impulsive and required several verbal cues to ensure safety. Pt stood to RW with minA and ambulated to bathroom CGA. Mobility able to provide cord management throughout ambulation. Pt returned to supine with supervision and assistance to reposition to [facility]. Overall, pt tolerated well. Pt was left in bed with all needs in reach and alarm set. Dinner tray entered at the end of session and mobility assisted with set up. Nurse notified.    Kathee Delton Mobility Specialist 05/05/20, 5:12 PM

## 2020-05-05 NOTE — Progress Notes (Signed)
PROGRESS NOTE    Andre [name]  JJK:[id number] DOB: [birth year] DOA: 04/30/2020 PCP: [name], MD    Brief Narrative:  85 y.o. male with medical history significant of HTN, HLD, COPD, GERD, hypothyroidism, depression, thrombocytopenia, pulmonary fibrosis, PE and atrial fibrillation on Eliquis, Parkinson's disease, CAD, stent placement, dCHF, skin cancer, OSA on CPAP, who presents with fall and weakness.  Per his wife, pt's functional status has been progressively declining since Thanksgiving.  He has generalized weakness, particularly in the past 2 weeks.  Patient has poor appetite, decreased oral intake. Patient has dizziness, slurred speech, and had multiple fall recently.  Patient was seen by his neurologist, and had an MRI of brain on 04/20/2020 which was negative for stroke.  Patient denies chest pain, shortness breath, fever or chills.  He has mild dry cough.  No nausea vomiting, diarrhea, abdominal pain, symptoms of UTI.  ED Course: pt was found to have WBC 12.7, troponin level 6, 6, BNP 154, TSH and Free T4 level normal, negative urinalysis, pending COVID PCR, electrolytes renal function okay, temperature normal, blood pressure, 16/84, heart rate 76, RR 30, oxygen saturation 92-95% on room air.  CT of the head is negative for acute intracranial abnormalities, showed small meningioma in the right temporal lobe.  Chest x-ray showed stable interstitial disease with possible asbestos exposure  Assessment & Plan:   Principal Problem:   Fall Active Problems:   PAF (paroxysmal atrial fibrillation) (HCC)   Parkinson's disease ([place])   Coronary artery disease involving native coronary artery of native heart with angina pectoris (HCC)   GERD (gastroesophageal reflux disease)   Hyperlipidemia with target LDL less than 70   Essential hypertension   (HFpEF) heart failure with preserved ejection fraction (HCC)   Hypothyroidism   Anxiety and depression   COPD (chronic obstructive  pulmonary disease) (HCC)   Pulmonary fibrosis (HCC)   Leukocytosis   Declining functional status   Declining functional status, multiple falls and generalized weakness:  -Increased weakness likely secondary to dehydration and poor po intake -Presented clinically dehydrated on presentation, given IVF hydration -CT head noted to be unremarkable -TSH and free T4 level normal.  CT head negative for acute intracranial abnormalities. patient had MRI of brain 04/20/2020 which was negative for stroke. -[name] states that she cannot take care of patient at home. -TOC following for SNF placement, remains pending at this time  PAF (paroxysmal atrial fibrillation) (HCC) -Coreg, Eliquis as tolerated -Currently rate controlled  Parkinson's disease ([place]) -Continued on Sinemet  Coronary artery disease involving native coronary artery of native heart with angina pectoris (HCC) -continue Crestor  GERD (gastroesophageal reflux disease) -Continue with Protonix as tolerated  Hyperlipidemia with target LDL less than 70 -Crestor  Essential hypertension -IV hydralazine IV as needed -Coreg, Cozaar -BP presently remains stable and controlled  (HFpEF) heart failure with preserved ejection fraction ([place]): 2D echo on 08/28/2017 showed EF of 60 deficit 5% with grade 2 diastolic dysfunction.  Patient does not have leg edema, appeared dehydrated initially -Lasix on hold given concerns of dehydration per below -Check BNP --> 154 -will check daily wts, repeat bmet in AM  Hypothyroidism -Continue with Synthroid as tolerated  Anxiety and depression -Continue home medications  COPD (chronic obstructive pulmonary disease) ([place]) and Pulmonary fibrosis (HCC) -Bronchodilators  Leukocytosis: Presenting WBC 12.7.  No fever.  No source of infection identified.  Likely reactive. WBC has since normalized -Repeat WBC normalized without needing abx  Constipation -Very good results with  cathartics  Dementia -very confused recently, requiring antipsychotic -receiving trial of qhs seroquel -continue with PRN haldol  Dehydration -Pt's wife reported poor po intake leading up to admit -Mucus membranes appeared dry -Currently continued on IVF hydration  -Low threshold to hold further IVF given hx of CHF   DVT prophylaxis: Eliquis Code Status: Full Family Communication: Pt in room, wife at bedside  Status is: Observation  The patient will require care spanning > 2 midnights and should be moved to inpatient because: IV treatments appropriate due to intensity of illness or inability to take PO and Inpatient level of care appropriate due to severity of illness  Dispo: The patient is from: Home              Anticipated d/c is to: SNF              Anticipated d/c date is: 2 days              Patient currently is medically stable to d/c. Just pending placement   Consultants:     Procedures:     Antimicrobials: Anti-infectives (From admission, onward)   None      Subjective: Tired this AM, without complaints  Objective: Vitals:   05/05/20 0500 05/05/20 0509 05/05/20 0833 05/05/20 1223  BP:  136/66 118/81 126/73  Pulse:  73 85 (!) 59  Resp:  20  16  Temp:  97.8 F (36.6 C)  97.8 F (36.6 C)  TempSrc:      SpO2:  95% 97% 99%  Weight: 93.9 kg     Height:        Intake/Output Summary (Last 24 hours) at 05/05/2020 1525 Last data filed at 05/05/2020 1400 Gross per 24 hour  Intake 720 ml  Output 200 ml  Net 520 ml   Filed Weights   04/30/20 0844 05/02/20 0254 05/05/20 0500  Weight: 97 kg 96 kg 93.9 kg    Examination: General exam: Awake, laying in bed, in nad, mucus membranes appear dry Respiratory system: Normal respiratory effort, no wheezing Cardiovascular system: regular rate, s1, s2 Gastrointestinal system: Soft, nondistended, positive BS Central nervous system: CN2-12 grossly intact, strength intact Extremities: Perfused, no  clubbing Skin: Normal skin turgor, no notable skin lesions seen Psychiatry: Mood normal // no visual hallucinations   Data Reviewed: I have personally reviewed following labs and imaging studies  CBC: Recent Labs  Lab 04/30/20 0847 05/01/20 0218 05/02/20 0943 05/04/20 0603  WBC 12.7* 9.9 9.9 8.3  HGB 16.7 16.0 15.2 15.0  HCT 50.9 50.8 47.4 46.0  MCV 88.8 90.2 89.8 89.1  PLT 164 140* 138* 361*   Basic Metabolic Panel: Recent Labs  Lab 04/30/20 0847 05/01/20 0218 05/02/20 0943 05/04/20 0603 05/05/20 0354  NA 137 141 140 141 143  K 3.5 3.2* 4.0 3.4* 4.0  CL 97* 102 106 105 108  CO2 26 27 23 27 25   GLUCOSE 93 69* 98 77 87  BUN 21 19 16 12 17   CREATININE 0.96 0.87 0.85 0.86 0.83  CALCIUM 9.4 8.7* 8.4* 8.5* 9.0   GFR: Estimated Creatinine Clearance: 74.9 mL/min (by C-G formula based on SCr of 0.83 mg/dL). Liver Function Tests: Recent Labs  Lab 05/02/20 0943 05/04/20 0603 05/05/20 0354  AST 16 13* 11*  ALT 8 <5 <5  ALKPHOS 74 74 76  BILITOT 1.5* 1.7* 1.7*  PROT 6.0* 6.1* 6.3*  ALBUMIN 3.3* 3.3* 3.4*   No results for input(s): LIPASE, AMYLASE in the last 168 hours. No results for  input(s): AMMONIA in the last 168 hours. Coagulation Profile: No results for input(s): INR, PROTIME in the last 168 hours. Cardiac Enzymes: Recent Labs  Lab 04/30/20 0847  CKTOTAL 173   BNP (last 3 results) No results for input(s): PROBNP in the last 8760 hours. HbA1C: No results for input(s): HGBA1C in the last 72 hours. CBG: No results for input(s): GLUCAP in the last 168 hours. Lipid Profile: No results for input(s): CHOL, HDL, LDLCALC, TRIG, CHOLHDL, LDLDIRECT in the last 72 hours. Thyroid Function Tests: No results for input(s): TSH, T4TOTAL, FREET4, T3FREE, THYROIDAB in the last 72 hours. Anemia Panel: No results for input(s): VITAMINB12, FOLATE, FERRITIN, TIBC, IRON, RETICCTPCT in the last 72 hours. Sepsis Labs: No results for input(s): PROCALCITON, LATICACIDVEN in the  last 168 hours.  Recent Results (from the past 240 hour(s))  SARS CORONAVIRUS 2 (TAT 6-24 HRS) Nasopharyngeal Nasopharyngeal Swab     Status: None   Collection Time: 04/30/20  2:28 PM   Specimen: Nasopharyngeal Swab  Result Value Ref Range Status   SARS Coronavirus 2 NEGATIVE NEGATIVE Final    Comment: (NOTE) SARS-CoV-2 target nucleic acids are NOT DETECTED.  The SARS-CoV-2 RNA is generally detectable in upper and lower respiratory specimens during the acute phase of infection. Negative results do not preclude SARS-CoV-2 infection, do not rule out co-infections with other pathogens, and should not be used as the sole basis for treatment or other patient management decisions. Negative results must be combined with clinical observations, patient history, and epidemiological information. The expected result is Negative.  Fact Sheet for Patients: SugarRoll.be  Fact Sheet for Healthcare Providers: https://www.woods-mathews.com/  This test is not yet approved or cleared by the [place] FDA and  has been authorized for detection and/or diagnosis of SARS-CoV-2 by FDA under an Emergency Use Authorization (EUA). This EUA will remain  in effect (meaning this test can be used) for the duration of the COVID-19 declaration under Se ction 564(b)(1) of the Act, 21 U.S.C. section 360bbb-3(b)(1), unless the authorization is terminated or revoked sooner.  Performed at [facility], [place] [address], [place], [place] [address]      Radiology Studies: No results found.  Scheduled Meds: . apixaban  5 mg Oral BID  . bisacodyl  10 mg Rectal Once  . buPROPion  150 mg Oral BID  . carbidopa-levodopa  2 tablet Oral TID  . carvedilol  6.25 mg Oral BID WC  . entacapone  200 mg Oral TID  . feeding supplement  237 mL Oral TID BM  . fluticasone  1 spray Each Nare Daily  . levothyroxine  50 mcg Oral QAC breakfast  . losartan  100 mg Oral Daily  .  multivitamin with minerals  1 tablet Oral Daily  . pantoprazole  40 mg Oral Daily  . QUEtiapine  25 mg Oral QHS  . rosuvastatin  10 mg Oral Daily  . sertraline  50 mg Oral Daily  . tiotropium  18 mcg Inhalation Daily   Continuous Infusions: . sodium chloride 75 mL/hr at 05/04/20 2117     LOS: 0 days   Marylu Lund, MD [facility] Pager On Amion  If 7PM-7AM, please contact night-coverage 05/05/2020, 3:25 PM

## 2020-05-05 NOTE — Progress Notes (Signed)
Occupational Therapy Treatment Patient Details Name: Andre [name] MRN: [id number] DOB: 1936/01/19 Today's Date: 05/05/2020    History of present illness Pt to ED from home via [facility] for chief complaint of weakness x2 weeks with multiple falls. Pt with slurred speech, per EMS this is normal   Pt reports dizziness and weakness, denies pain. PMH includes: parkinsons, PE, PAF, HTN, HLD, GERD, CAD, depression, CHF   OT comments  [name] seen for OT treatment on this date. Upon arrival to room pt reclined in chair attempting to don cpap mask - per RN this is pt goal. OT provided environmental modifications (increased lighting, provided mirror, adjusted seating position) and multimodal cueing to sequence task (pt required constant cues to open eyes and look in mirror, VCs for hand placement and clipping one side of mask prior to donning). Ultimately MIN A don cpap and MOD I doff. Pt making good progress toward goals. Pt continues to benefit from skilled OT services to maximize return to PLOF and minimize risk of future falls, injury, caregiver burden, and readmission. Will continue to follow POC. Discharge recommendation remains appropriate.    Follow Up Recommendations  SNF;Supervision/Assistance - 24 hour    Equipment Recommendations  Other (comment) (defer to nect venue of care)    Recommendations for Other Services      Precautions / Restrictions Precautions Precautions: Fall Restrictions Weight Bearing Restrictions: No       Mobility Bed Mobility               General bed mobility comments: Pt received and left up in chair                       ADL either performed or assessed with clinical judgement   ADL Overall ADL's : Needs assistance/impaired                                       General ADL Comments: MIN A don cpap mask reclined in chair - pt requires MOD cues for sequencing and visually attending to task (pt has eyes closed t/o) and MIN  physical assist. MOD I to doff cpap machine               Cognition Arousal/Alertness: Awake/alert Behavior During Therapy: WFL for tasks assessed/performed;Agitated Overall Cognitive Status: No family/caregiver present to determine baseline cognitive functioning                                 General Comments: Requires multimodal cueing for sequencing task, porr insight into dficits        Exercises Exercises: Other exercises Other Exercises Other Exercises: Pt educated re: d/c recs, falls prevention, Other Exercises: cpap don/doffing technique           Pertinent Vitals/ Pain       Pain Assessment: No/denies pain         Frequency  Min 1X/week        Progress Toward Goals  OT Goals(current goals can now be found in the care plan section)  Progress towards OT goals: Progressing toward goals  Acute Rehab OT Goals Patient Stated Goal: to go home OT Goal Formulation: With patient Time For Goal Achievement: 05/15/20 Potential to Achieve Goals: Good ADL Goals Pt Will Perform Grooming: with modified independence;standing Pt Will Perform Lower  Body Dressing: with supervision;sit to/from stand Pt Will Transfer to Toilet: with supervision;ambulating Pt Will Perform Toileting - Clothing Manipulation and hygiene: with supervision;sit to/from stand  Plan Discharge plan remains appropriate;Frequency remains appropriate       AM-PAC OT "6 Clicks" Daily Activity     Outcome Measure   Help from another person eating meals?: A Little Help from another person taking care of personal grooming?: A Little Help from another person toileting, which includes using toliet, bedpan, or urinal?: A Lot Help from another person bathing (including washing, rinsing, drying)?: A Lot Help from another person to put on and taking off regular upper body clothing?: A Little Help from another person to put on and taking off regular lower body clothing?: A Lot 6 Click Score:  15    End of Session    OT Visit Diagnosis: Unsteadiness on feet (R26.81);Repeated falls (R29.6);Muscle weakness (generalized) (M62.81)   Activity Tolerance Patient tolerated treatment well   Patient Left in chair;with call bell/phone within reach;with chair alarm set   Nurse Communication Mobility status        Time: 4818-5631 OT Time Calculation (min): 28 min  Charges: OT General Charges $OT Visit: 1 Visit OT Treatments $Self Care/Home Management : 23-37 mins  Dessie Coma, M.S. OTR/L  05/05/20, 4:04 PM  ascom [phone]

## 2020-05-06 ENCOUNTER — Ambulatory Visit: Payer: PPO | Admitting: Neurology

## 2020-05-06 NOTE — Progress Notes (Signed)
Mobility Specialist - Progress Note   05/06/20 1644  Mobility  Activity Ambulated in hall  Level of Assistance Minimal assist, patient does 75% or more  Assistive Device Front wheel walker  Distance Ambulated (ft) 260 ft  Mobility Response Tolerated well  Mobility performed by Mobility specialist  $Mobility charge 1 Mobility    Pre-mobility: 76 HR, 96% SpO2 Post-mobility: 90 HR, 96% SpO2   Pt was sleeping in bed upon arrival utilizing room air. Pt awakens easily to voice and agrees to session. Pt was able to get EOB with supervision, however is impulsive and requires cueing for safety. LOB noted x1 while seated EOB as pt attempts standing without cue, easily corrected with CGA. Pt educated on the importance of proper preparation to ensure safety. Pt carries over commands fairly well, although cognitively confused most of session. Pt stood to RW from elevated bed height with minA. Pt ambulated 260' total in room/hallway with minA as pt ambulates with increased speed. No LOB noted. After ~80', pt began c/o pain in toe. Pain does not limit mobility efforts. Max cueing to keep RW close to body. No rest breaks taken during activity. Upon return to bed, pt able to return supine with minA for cord management as pt presents with poor awareness of IV lines. Overall, pt tolerated session well. Pt was left in bed with all needs in reach and alarm set.    Kathee Delton Mobility Specialist 05/06/20, 5:02 PM

## 2020-05-06 NOTE — Evaluation (Signed)
Physical Therapy Evaluation Patient Details Name: Andre [name] MRN: [id number] DOB: May 25, 1935 Today's Date: 05/06/2020   History of Present Illness  85 y/o here for weakness x2 weeks with multiple falls. Pt with slurred speech, per EMS this is normal   Pt reports dizziness and weakness, denies pain. PMH includes: parkinsons, PE, PAF, HTN, HLD, GERD, CAD, depression, CHF  Clinical Impression  Pt was eager to work with PT and ultimately did relatively well.  Pt continues to be quite confused but is very pleasant and motivated the entire time.  Overall his mobility is good, however his safely and general awareness is lacking. Pt continues to need close supervision/guidance, and is requiring more UE/AD support than at baseline.    Follow Up Recommendations SNF;Supervision for mobility/OOB    Equipment Recommendations  None recommended by PT    Recommendations for Other Services       Precautions / Restrictions Precautions Precautions: Fall Restrictions Weight Bearing Restrictions: No      Mobility  Bed Mobility Overal bed mobility: Needs Assistance Bed Mobility: Supine to Sit     Supine to sit: Min guard     General bed mobility comments: Pt able to get to sitting from supine w/o assist, in relciner post session    Transfers Overall transfer level: Needs assistance Equipment used: Rolling walker (2 wheeled) Transfers: Sit to/from Stand Sit to Stand: Min guard         General transfer comment: Pt was able to rise to standing, cuing for hand placement/set up  Ambulation/Gait Ambulation/Gait assistance: Min assist Gait Distance (Feet): 100 Feet Assistive device: Rolling walker (2 wheeled)       General Gait Details: Per his typical he had short/choppy steps with mild unsteadiness but no overt LOBs that needed assist.  PT did need to give consistent cues/guidance 2/2 to pt's poor safety (and general) awareness.  Pt's O2 remained in the 90s t/o the effort and he  reported only mild fatigue.  Stairs            Wheelchair Mobility    Modified Rankin (Stroke Patients Only)       Balance Overall balance assessment: Needs assistance Sitting-balance support: Feet unsupported;Bilateral upper extremity supported Sitting balance-Leahy Scale: Good     Standing balance support: Bilateral upper extremity supported;During functional activity Standing balance-Leahy Scale: Fair Standing balance comment: with with inconsistent effort/confidence with standing tasks, no overt LOBs but again poor awareness and insight                             Pertinent Vitals/Pain Pain Assessment: No/denies pain    Home Living                        Prior Function                 Hand Dominance        Extremity/Trunk Assessment                Communication      Cognition Arousal/Alertness: Awake/alert Behavior During Therapy: WFL for tasks assessed/performed;Impulsive Overall Cognitive Status: History of cognitive impairments - at baseline                                        General Comments      Exercises  General Exercises - Lower Extremity Ankle Circles/Pumps: AROM;10 reps Long Arc Quad: Strengthening;10 reps Heel Slides: Strengthening;10 reps Hip ABduction/ADduction: Strengthening;10 reps Hip Flexion/Marching: Strengthening;10 reps   Assessment/Plan    PT Assessment    PT Problem List         PT Treatment Interventions      PT Goals (Current goals can be found in the Care Plan section)       Frequency Min 2X/week   Barriers to discharge        Co-evaluation               AM-PAC PT "6 Clicks" Mobility  Outcome Measure Help needed turning from your back to your side while in a flat bed without using bedrails?: None Help needed moving from lying on your back to sitting on the side of a flat bed without using bedrails?: None Help needed moving to and from a bed to a  chair (including a wheelchair)?: None Help needed standing up from a chair using your arms (e.g., wheelchair or bedside chair)?: None Help needed to walk in hospital room?: A Little Help needed climbing 3-5 steps with a railing? : A Little 6 Click Score: 22    End of Session Equipment Utilized During Treatment: Gait belt Activity Tolerance: Patient limited by fatigue Patient left: with chair alarm set;with call bell/phone within reach Nurse Communication: Mobility status PT Visit Diagnosis: Muscle weakness (generalized) (M62.81);Unsteadiness on feet (R26.81);Other abnormalities of gait and mobility (R26.89);Difficulty in walking, not elsewhere classified (R26.2);Repeated falls (R29.6)    Time: 9373-4287 PT Time Calculation (min) (ACUTE ONLY): 25 min   Charges:     PT Treatments $Gait Training: 8-22 mins $Therapeutic Exercise: 8-22 mins        Kreg Shropshire, DPT 05/06/2020, 1:01 PM

## 2020-05-06 NOTE — Progress Notes (Signed)
[facility] PROGRESS NOTE    Andre [name]  GGY:[id number] DOB: [birth year] DOA: 04/30/2020 PCP: [name], MD      Brief Narrative:  Andre [name] is a 85 y.o. M with COPD not on O2, hypothyroidism, IPF, PE and Afib on Eliquis, Parkinson's disease, CAD s/p remote PCI, dCHF, OSA on CPAP and chronic thrombocytopenia who presented with fall.  Per report, patient has had a subacute decline in functional status since around Thanksgiving, with generalized weakness noticeably worse than last 2 weeks, as well as poor appetite, decreased fluid intake.  The last several days he had dizziness and several falls.  Seen by his neurologist had MRI brain unremarkable family too weak to get up and so came to the ER.  In the ER mild leukocytosis, thyroid studies normal, COVID-negative, CT head normal, chest x-ray no change from baseline.          Assessment & Plan:  Generalized weakness due to dehydration Multiple falls due to deconditioning, exacerbated by dehydration and weakness The patient was admitted and started on IV fluids.  TSH was normal. He is on B12 supplementation.  MRI brain recently unremarkable, no acute change since.    He was evaluated by PT who recommended skilled nursing facility placement.     Paroxysmal atrial fibrillation -Continue Eliquis  Parkinson's disease Dementia Anxiety and depression -Continue entacapone -Continue Sinemet -Continue buporpion, quetiapine, sertraline  Coronary artery disease Hypertension Chronic diastolic CHF -Continue carvedilol, losartan, Crestor  Hypothyroidism -Continue levothyroxine  COPD without exacerbation Pulmonary fibrosis -Continue PPI -Continue Spiriva    Hypokalemia -Supplement K      Disposition: Status is: Inpatient  Remains inpatient appropriate because:Unsafe d/c plan   Dispo: The patient is from: Home              Anticipated d/c is to: SNF              Anticipated d/c date is:  1 day              Patient currently is medically stable to d/c.              MDM: The below labs and imaging reports were reviewed and summarized above.  Medication management as above.    DVT prophylaxis:  apixaban (ELIQUIS) tablet 5 mg  Code Status: FULL Family Communication: Wife by phone          Subjective: Patient has no headache, chest pain, dyspnea, abdominal pain, dysuria, hematuria, vomiting  Objective: Vitals:   05/05/20 2317 05/06/20 0503 05/06/20 0748 05/06/20 1105  BP: (!) 131/58 (!) 154/84 (!) 161/94 117/62  Pulse: 79 79 79 82  Resp: 18  18 18   Temp: 98.8 F (37.1 C) 97.6 F (36.4 C)  98.1 F (36.7 C)  TempSrc: Oral Oral  Oral  SpO2: 96% 96% 98% 96%  Weight:      Height:        Intake/Output Summary (Last 24 hours) at 05/06/2020 1259 Last data filed at 05/06/2020 0935 Gross per 24 hour  Intake 480 ml  Output 100 ml  Net 380 ml   Filed Weights   04/30/20 0844 05/02/20 0254 05/05/20 0500  Weight: 97 kg 96 kg 93.9 kg    Examination: General appearance:  adult male, alert and in no acute distress.   HEENT: Anicteric, conjunctiva pink, lids and lashes normal. No nasal deformity, discharge, epistaxis.  Lips moist, oropharynx moist.   Skin: Warm and dry.  No jaundice.  No suspicious rashes or lesions. Cardiac: RRR, nl S1-S2, no murmurs appreciated.  Capillary refill is brisk.  JVP normal. No  LE edema.  Radial  pulses 2+ and symmetric. Respiratory: Normal respiratory rate and rhythm.  CTAB without rales or wheezes. Abdomen: Abdomen soft.  no TTP or guarding. No ascites, distension, hepatosplenomegaly.   MSK: No deformities or effusions. Neuro: Awake and alert.  EOMI, moves all extremities. Speech fluent.    Psych: Sensorium intact and responding to questions, attention normal. Affect blunted.  Judgment and insight appear impaired.    Data Reviewed: I have personally reviewed following labs and imaging studies:  CBC: Recent Labs   Lab 05-16-20 0847 05/01/20 0218 05/02/20 0943 05/04/20 0603  WBC 12.7* 9.9 9.9 8.3  HGB 16.7 16.0 15.2 15.0  HCT 50.9 50.8 47.4 46.0  MCV 88.8 90.2 89.8 89.1  PLT 164 140* 138* [id number]*   Basic Metabolic Panel: Recent Labs  Lab May 16, 2020 0847 05/01/20 0218 05/02/20 0943 05/04/20 0603 05/05/20 0354  NA 137 141 140 141 143  K 3.5 3.2* 4.0 3.4* 4.0  CL 97* 102 106 105 108  CO2 26 27 23 27 25   GLUCOSE 93 69* 98 77 87  BUN 21 19 16 12 17   CREATININE 0.96 0.87 0.85 0.86 0.83  CALCIUM 9.4 8.7* 8.4* 8.5* 9.0   GFR: Estimated Creatinine Clearance: 74.9 mL/min (by C-G formula based on SCr of 0.83 mg/dL). Liver Function Tests: Recent Labs  Lab 05/02/20 0943 05/04/20 0603 05/05/20 0354  AST 16 13* 11*  ALT 8 <5 <5  ALKPHOS 74 74 76  BILITOT 1.5* 1.7* 1.7*  PROT 6.0* 6.1* 6.3*  ALBUMIN 3.3* 3.3* 3.4*   No results for input(s): LIPASE, AMYLASE in the last 168 hours. No results for input(s): AMMONIA in the last 168 hours. Coagulation Profile: No results for input(s): INR, PROTIME in the last 168 hours. Cardiac Enzymes: Recent Labs  Lab 2020/05/16 0847  CKTOTAL 173   BNP (last 3 results) No results for input(s): PROBNP in the last 8760 hours. HbA1C: No results for input(s): HGBA1C in the last 72 hours. CBG: No results for input(s): GLUCAP in the last 168 hours. Lipid Profile: No results for input(s): CHOL, HDL, LDLCALC, TRIG, CHOLHDL, LDLDIRECT in the last 72 hours. Thyroid Function Tests: No results for input(s): TSH, T4TOTAL, FREET4, T3FREE, THYROIDAB in the last 72 hours. Anemia Panel: No results for input(s): VITAMINB12, FOLATE, FERRITIN, TIBC, IRON, RETICCTPCT in the last 72 hours. Urine analysis:    Component Value Date/Time   COLORURINE AMBER (A) 05-16-2020 1013   APPEARANCEUR CLEAR (A) 2020-05-16 1013   APPEARANCEUR Clear 09/03/2018 1119   LABSPEC 1.023 16-May-2020 1013   PHURINE 5.0 05-16-2020 1013   GLUCOSEU NEGATIVE May 16, 2020 1013   HGBUR NEGATIVE  05/16/2020 1013   [place] 05/16/20 [place] 04/20/2020 1448   BILIRUBINUR Negative 09/03/2018 1119   KETONESUR 5 (A) 16-May-2020 1013   PROTEINUR NEGATIVE 05/16/2020 1013   UROBILINOGEN 0.2 04/20/2020 1448   NITRITE NEGATIVE 05/16/20 1013   LEUKOCYTESUR NEGATIVE 05/16/2020 1013   Sepsis Labs: @LABRCNTIP (procalcitonin:4,lacticacidven:4)  ) Recent Results (from the past 240 hour(s))  SARS CORONAVIRUS 2 (TAT 6-24 HRS) Nasopharyngeal Nasopharyngeal Swab     Status: None   Collection Time: May 16, 2020  2:28 PM   Specimen: Nasopharyngeal Swab  Result Value Ref Range Status   SARS Coronavirus 2 NEGATIVE NEGATIVE Final    Comment: (NOTE) SARS-CoV-2 target nucleic acids are NOT DETECTED.  The SARS-CoV-2 RNA is generally  detectable in upper and lower respiratory specimens during the acute phase of infection. Negative results do not preclude SARS-CoV-2 infection, do not rule out co-infections with other pathogens, and should not be used as the sole basis for treatment or other patient management decisions. Negative results must be combined with clinical observations, patient history, and epidemiological information. The expected result is Negative.  Fact Sheet for Patients: SugarRoll.be  Fact Sheet for Healthcare Providers: https://www.woods-mathews.com/  This test is not yet approved or cleared by the [place] FDA and  has been authorized for detection and/or diagnosis of SARS-CoV-2 by FDA under an Emergency Use Authorization (EUA). This EUA will remain  in effect (meaning this test can be used) for the duration of the COVID-19 declaration under Se ction 564(b)(1) of the Act, 21 U.S.C. section 360bbb-3(b)(1), unless the authorization is terminated or revoked sooner.  Performed at [facility], [place] [address], [place],  [address]          Radiology Studies: No results  found.      Scheduled Meds: . apixaban  5 mg Oral BID  . bisacodyl  10 mg Rectal Once  . buPROPion  150 mg Oral BID  . carbidopa-levodopa  2 tablet Oral TID  . carvedilol  6.25 mg Oral BID WC  . entacapone  200 mg Oral TID  . feeding supplement  237 mL Oral TID BM  . fluticasone  1 spray Each Nare Daily  . levothyroxine  50 mcg Oral QAC breakfast  . losartan  100 mg Oral Daily  . multivitamin with minerals  1 tablet Oral Daily  . pantoprazole  40 mg Oral Daily  . QUEtiapine  25 mg Oral QHS  . rosuvastatin  10 mg Oral Daily  . sertraline  50 mg Oral Daily  . tiotropium  18 mcg Inhalation Daily   Continuous Infusions: . sodium chloride 75 mL/hr at 05/06/20 0608     LOS: 1 day    Time spent: 25 minutes    Edwin Dada, MD [facility] 05/06/2020, 12:59 PM     Please page though [place] or Epic secure chat:  For [employer], Adult nurse

## 2020-05-06 NOTE — TOC Progression Note (Addendum)
Transition of Care [facility]) - Progression Note    Patient Details  Name: Andre [name] MRN: [id number] Date of Birth: 1935-12-06  [facility] CM/SW [place], LCSW Phone Number: 05/06/2020, 10:54 AM  Clinical Narrative: Per RN, patient unable to clip and unclip cpap mask while on. Left [place] admissions coordinator a message to notify and confirm this will not hinder discharge.  11:23 am: [facility] is still able to accept patient tomorrow. Left voicemail at [facility]. Will start insurance authorization once they call back. Asked MD to order COVID test. SNF will need discharge summary by 12:00 tomorrow so they can ensure they get his medications before the snow starts. Their pharmacy is in [place]. MD is aware and will complete in the morning.  11:46 am: Received call back from [place] at [facility]. Started Ship [facility] for SNF and EMS transport. Wife is aware.  Expected Discharge Plan: [place] Barriers to Discharge: Continued Medical Work up  Expected Discharge Plan and Services Expected Discharge Plan: [place] In-house Referral: Clinical Social Work   Post Acute Care Choice: [place] Living arrangements for the past 2 months: Single Family Home                                       Social Determinants of Health (SDOH) Interventions    Readmission Risk Interventions No flowsheet data found.

## 2020-05-07 LAB — SARS CORONAVIRUS 2 (TAT 6-24 HRS): SARS Coronavirus 2: NEGATIVE

## 2020-05-07 MED ORDER — ENSURE ENLIVE PO LIQD: 237.0000 mL | Freq: Three times a day (TID) | ORAL | 12 refills | Status: DC

## 2020-05-07 MED ORDER — FUROSEMIDE 20 MG PO TABS: 20.0000 mg | ORAL_TABLET | Freq: Every day | ORAL | Status: DC

## 2020-05-07 MED ORDER — QUETIAPINE FUMARATE 25 MG PO TABS: 25.0000 mg | ORAL_TABLET | Freq: Every day | ORAL | Status: DC

## 2020-05-07 MED ORDER — ALBUTEROL SULFATE HFA 108 (90 BASE) MCG/ACT IN AERS: 2.0000 | INHALATION_SPRAY | Freq: Four times a day (QID) | RESPIRATORY_TRACT | 2 refills | Status: DC | PRN

## 2020-05-07 MED ORDER — HYDROCODONE-ACETAMINOPHEN 5-325 MG PO TABS: 1.0000 | ORAL_TABLET | Freq: Four times a day (QID) | ORAL | 0 refills | Status: DC | PRN

## 2020-05-07 NOTE — Progress Notes (Signed)
Mobility Specialist - Progress Note   05/07/20 1300  Mobility  Activity Ambulated in hall  Level of Assistance Minimal assist, patient does 75% or more  Assistive Device Front wheel walker  Distance Ambulated (ft) 180 ft  Mobility Response Tolerated well  Mobility performed by Mobility specialist  $Mobility charge 1 Mobility    Pt was sleeping in bed upon arrival. Pt awakens easily and agrees to session. Pt's was provided wet wash cloth to wash face and prep for activity. Pt was able to get EOB this date with modA. No c/o dizziness upon sitting. Pt stood to RW with minA from elevated bed height. Still no reports of dizziness. Pt ambulated 180' in room/hallway with no LOB noted. Pt's vitals difficult to obtain d/t poor pleth reading. No heavy breathing noted. Pt denied SOB. Pt needed min cueing to keep RW close to body and occasionally required assistance for steering RW. Overall, pt tolerated session well. Pt was left in recliner with lunch tray set up and placed in front of him. Chair alarm set. Nurse made aware.    Kathee Delton Mobility Specialist 05/07/20, 1:18 PM

## 2020-05-07 NOTE — Clinical Social Work Note (Signed)
RE: Andre [name] Date of Birth: [birth year] Date: 05/07/2020   To Whom It May Concern:  Please be advised that the above-named patient will require a short-term nursing home stay - anticipated 30 days or less for rehabilitation and strengthening.  The plan is for return home.

## 2020-05-07 NOTE — Discharge Summary (Addendum)
Physician Discharge Summary  AMOGH BEAUMAN [id number] DOB: December 10, 1935 DOA: 04/30/2020  PCP: [name], MD  Admit date: 04/30/2020 Discharge date: 05/07/2020  Admitted From: Home  Disposition:  SNF   Recommendations for Outpatient Follow-up:  1. Follow up with PCP Dr. [name] in 1 week after discharge from SNF 2. Follow up with Neurology Dr. Jannifer Franklin in 1 month as scheduled 3. Repeat BMP in 1 week to evaluate K      Home Health: N/A  Equipment/Devices: TBD at SNF  Discharge Condition: Fair  CODE STATUS: FULL Diet recommendation: Cardiac  Brief/Interim Summary: Mr. Datema is a 85 y.o. M with COPD not on O2, hypothyroidism, IPF, PE and Afib on Eliquis, Parkinson's disease, CAD s/p remote PCI, dCHF, OSA on CPAP and chronic thrombocytopenia who presented with fall.  Per report, patient has had a subacute decline in functional status since around Thanksgiving, with generalized weakness noticeably worse than last 2 weeks, as well as poor appetite, decreased fluid intake.  The last several days he had dizziness and several falls.  Seen by his neurologist had MRI brain unremarkable family too weak to get up and so came to the ER.  In the ER mild leukocytosis, thyroid studies normal, COVID-negative, CT head normal, chest x-ray no change from baseline.     PRINCIPAL HOSPITAL DIAGNOSIS: Generalized weakness due to dehydration    Discharge Diagnoses:   Generalized weakness due to dehydration Unclear trigger for dehydration.  Then developed multiple falls due to deconditioning, exacerbated by dehydration and generalized weakness.  He was admitted and started on IV fluids.  TSH was normal. He is on B12 supplementation.  MRI brain recently unremarkable, no acute change since.  His physical function improved marginally, but he remained significantly weaker than baseline, and required rehabilitation to return to his prior level of function.        Paroxysmal atrial  fibrillation Continue Eliquis  Parkinson's disease Dementia Anxiety and depression Continue entacapone, Sinemet, buporpion, quetiapine, sertraline  Coronary artery disease Hypertension Chronic diastolic CHF Continue carvedilol, losartan, Crestor  Hypothyroidism Continue levothyroxine  COPD without exacerbation Pulmonary fibrosis Continue PPI, Spiriva   Hypokalemia Resolved          Discharge Instructions  Discharge Instructions    Increase activity slowly   Complete by: As directed      Allergies as of 05/07/2020      Reactions   Pravastatin Other (See Comments)   Prednisone Other (See Comments)   Pt states that med makes him hyper Pt states that med makes him hyper      Medication List    STOP taking these medications   albuterol (2.5 MG/3ML) 0.083% nebulizer solution Commonly known as: PROVENTIL Replaced by: albuterol 108 (90 Base) MCG/ACT inhaler You also have another medication with the same name that you need to continue taking as instructed.     TAKE these medications   buPROPion 150 MG 12 hr tablet Commonly known as: Wellbutrin SR Take 1 tablet (150 mg total) by mouth 2 (two) times daily.   carbidopa-levodopa 25-250 MG tablet Commonly known as: SINEMET IR TAKE TWO TABLETS 3 TIMES DAILY What changed: See the new instructions.   carvedilol 3.125 MG tablet Commonly known as: COREG TAKE TWO TABLETS TWICE A DAY WITH MEALS What changed: See the new instructions.   Eliquis 5 MG Tabs tablet Generic drug: apixaban TAKE ONE TABLET BY MOUTH TWICE DAILY What changed: how much to take   entacapone 200 MG tablet Commonly known as:  COMTAN Take 1 tablet (200 mg total) by mouth 3 (three) times daily.   esomeprazole 40 MG capsule Commonly known as: NEXIUM TAKE 1 CAPSULE BY MOUTH ONCE DAILY   feeding supplement Liqd Take 237 mLs by mouth 3 (three) times daily between meals.   fluticasone 50 MCG/ACT nasal spray Commonly known as:  FLONASE USE 2 PUFFS IN EACH NOSTRIL DAILY   Flutter Devi 1 Device by Does not apply route daily.   furosemide 20 MG tablet Commonly known as: LASIX Take 1 tablet (20 mg total) by mouth daily. What changed: how much to take   HYDROcodone-acetaminophen 5-325 MG tablet Commonly known as: NORCO/VICODIN Take 1 tablet by mouth every 6 (six) hours as needed for moderate pain or severe pain.   hydrocortisone 2.5 % lotion Apply topically daily. Apply to affected areas 4 times per week.   ketoconazole 2 % cream Commonly known as: NIZORAL Apply 1 application topically daily as needed for irritation.   ketoconazole 2 % shampoo Commonly known as: NIZORAL Apply 1 application topically 3 (three) times a week. Wash scalp, face and ears   ketorolac 0.5 % ophthalmic solution Commonly known as: ACULAR SMARTSIG:1 Drop(s) In Eye(s) Every 4-6 Hours PRN   lactulose 10 GM/15ML solution Commonly known as: CHRONULAC Take 30 mLs (20 g total) by mouth 2 (two) times daily as needed for moderate constipation.   levothyroxine 50 MCG tablet Commonly known as: SYNTHROID TAKE ONE TABLET ON AN EMPTY STOMACH WITHA GLASS OF WATER AT LEAST 30 TO 60 MINUTES BEFORE BREAKFAST What changed: See the new instructions.   losartan 100 MG tablet Commonly known as: COZAAR TAKE 1 TABLET BY MOUTH DAILY   mometasone 0.1 % cream Commonly known as: ELOCON   mupirocin ointment 2 % Commonly known as: Bactroban Apply 1 application topically 3 (three) times daily. What changed:   when to take this  reasons to take this   potassium chloride 10 MEQ tablet Commonly known as: KLOR-CON Take 1 tablet (10 mEq total) by mouth daily.   ProAir HFA 108 (90 Base) MCG/ACT inhaler Generic drug: albuterol INHALE 2 PUFFS INTO THE LUNGS EVERY SIX HOURS AS NEEDED FOR WHEEZING OR SHORTNESS OF BREATH What changed:   Another medication with the same name was added. Make sure you understand how and when to take each.  Another  medication with the same name was removed. Continue taking this medication, and follow the directions you see here.   albuterol 108 (90 Base) MCG/ACT inhaler Commonly known as: VENTOLIN HFA Inhale 2 puffs into the lungs every 6 (six) hours as needed for wheezing or shortness of breath. What changed: You were already taking a medication with the same name, and this prescription was added. Make sure you understand how and when to take each. Replaces: albuterol (2.5 MG/3ML) 0.083% nebulizer solution   QUEtiapine 25 MG tablet Commonly known as: SEROQUEL Take 1 tablet (25 mg total) by mouth at bedtime.   rosuvastatin 10 MG tablet Commonly known as: CRESTOR TAKE 1 TABLET BY MOUTH DAILY   sertraline 50 MG tablet Commonly known as: ZOLOFT Take 1 tablet (50 mg total) by mouth daily.   Stiolto Respimat 2.5-2.5 MCG/ACT Aers Generic drug: Tiotropium Bromide-Olodaterol Inhale 2 puffs into the lungs daily.       Allergies  Allergen Reactions  . Pravastatin Other (See Comments)  . Prednisone Other (See Comments)    Pt states that med makes him hyper Pt states that med makes him hyper  Procedures/Studies: DG Chest 2 View  Result Date: 04/30/2020 CLINICAL DATA:  Weakness. EXAM: CHEST - 2 VIEW COMPARISON:  December 24, 2019. FINDINGS: Stable cardiomegaly. No pneumothorax is noted. Stable interstitial densities and pleural calcifications are noted throughout both lungs. No definite acute abnormality is noted. No significant pleural effusion is noted. Bony thorax is unremarkable. IMPRESSION: Stable interstitial densities and pleural calcifications are noted throughout both lungs consistent with asbestos exposure. Aortic Atherosclerosis (ICD10-I70.0). Electronically Signed   By: Marijo Conception M.D.   On: 04/30/2020 09:34   CT HEAD WO CONTRAST  Result Date: 04/30/2020 CLINICAL DATA:  Mental status change. EXAM: CT HEAD WITHOUT CONTRAST TECHNIQUE: Contiguous axial images were obtained from  the base of the skull through the vertex without intravenous contrast. COMPARISON:  MRI head 04/20/2020 FINDINGS: Brain: Generalized atrophy. Negative for hydrocephalus. Patchy white matter hypodensity bilaterally appears chronic and unchanged. Negative for acute infarct or hemorrhage. Small dural based mass in the right lateral temporal lobe consistent with meningioma unchanged from prior MRI. Vascular: Negative for hyperdense vessel Skull: Negative Sinuses/Orbits: Paranasal sinuses clear.  Negative orbit. Other: None IMPRESSION: No acute abnormality Atrophy and chronic microvascular ischemic change Small meningioma right lateral temporal lobe. Electronically Signed   By: Franchot Gallo M.D.   On: 04/30/2020 09:13   MR Brain Wo Contrast  Result Date: 04/20/2020 CLINICAL DATA:  Slurred speech. Injury of head, initial encounter. Neuro deficit, acute, stroke suspected; slurred speech for 3 weeks, sleeping more than usual, head injury approximately 1 week ago with headaches since that time. EXAM: MRI HEAD WITHOUT CONTRAST TECHNIQUE: Multiplanar, multiecho pulse sequences of the brain and surrounding structures were obtained without intravenous contrast. COMPARISON:  Noncontrast head CT 10/30/2019. FINDINGS: Brain: Mild intermittent motion degradation. Moderate cerebral atrophy. Moderate multifocal T2/FLAIR hyperintensity within the cerebral white matter is nonspecific, but compatible with chronic small vessel ischemic disease. 1.3 x 0.4 cm lobular T1 and T2 intermediate signal and T2/FLAIR hyperintense dural based mass overlying the right temporal lobe (series 15, image 27). This likely reflects a small incidental meningioma. No significant mass effect upon the underlying right temporal lobe. No underlying parenchymal edema. There is no acute infarct. No chronic intracranial blood products. No extra-axial fluid collection. No midline shift. Vascular: Expected proximal arterial flow voids. Skull and upper cervical  spine: No focal marrow lesion. Sinuses/Orbits: Visualized orbits show no acute finding. Trace ethmoid sinus mucosal thickening. Small right maxillary sinus mucous retention cysts. Impression #4 will be called to the ordering clinician or representative by the Radiologist Assistant, and communication documented in the PACS or [employer]. IMPRESSION: Mildly motion degraded examination. No evidence of acute or recent subacute infarction. No acute posttraumatic intracranial findings. 1.3 x 0.4 cm dural-based mass overlying the right temporal lobe, likely reflecting a small incidental meningioma. No significant mass effect. Moderate cerebral atrophy and chronic small vessel ischemic disease. Small right maxillary sinus mucous retention cyst. Electronically Signed   By: Kellie Simmering DO   On: 04/20/2020 16:49      Subjective: No headache, chest pain, dyspnea, abdominal pain, vomiting, diarrhea or fever.  Discharge Exam: Vitals:   05/07/20 0545 05/07/20 0751  BP: (!) 148/93 (!) 146/86  Pulse: 70 74  Resp: 16 16  Temp: 98 F (36.7 C) (!) 97.5 F (36.4 C)  SpO2: 98% 97%   Vitals:   05/06/20 2306 05/07/20 0500 05/07/20 0545 05/07/20 0751  BP: (!) 138/59  (!) 148/93 (!) 146/86  Pulse: 75  70 74  Resp: 16  16 16  Temp: 97.7 F (36.5 C)  98 F (36.7 C) (!) 97.5 F (36.4 C)  TempSrc: Oral  Oral Oral  SpO2: 96%  98% 97%  Weight:  95 kg    Height:        General: Pt is alert, awake, not in acute distress resting, wakes easily, interactive. Cardiovascular: RRR, nl S1-S2, no murmurs appreciated.   No LE edema.   Respiratory: Normal respiratory rate and rhythm.  CTAB without rales or wheezes. Abdominal: Abdomen soft and non-tender.  No distension or HSM.   Neuro/Psych: Strength symmetric in upper and lower extremities.  Judgment and insight appear moderately impaired.  Speech somewhat dysarthric at baseline.   The results of significant diagnostics from this hospitalization (including  imaging, microbiology, ancillary and laboratory) are listed below for reference.     Microbiology: Recent Results (from the past 240 hour(s))  SARS CORONAVIRUS 2 (TAT 6-24 HRS) Nasopharyngeal Nasopharyngeal Swab     Status: None   Collection Time: 04/30/20  2:28 PM   Specimen: Nasopharyngeal Swab  Result Value Ref Range Status   SARS Coronavirus 2 NEGATIVE NEGATIVE Final    Comment: (NOTE) SARS-CoV-2 target nucleic acids are NOT DETECTED.  The SARS-CoV-2 RNA is generally detectable in upper and lower respiratory specimens during the acute phase of infection. Negative results do not preclude SARS-CoV-2 infection, do not rule out co-infections with other pathogens, and should not be used as the sole basis for treatment or other patient management decisions. Negative results must be combined with clinical observations, patient history, and epidemiological information. The expected result is Negative.  Fact Sheet for Patients: SugarRoll.be  Fact Sheet for Healthcare Providers: https://www.woods-mathews.com/  This test is not yet approved or cleared by the [place] FDA and  has been authorized for detection and/or diagnosis of SARS-CoV-2 by FDA under an Emergency Use Authorization (EUA). This EUA will remain  in effect (meaning this test can be used) for the duration of the COVID-19 declaration under Se ction 564(b)(1) of the Act, 21 U.S.C. section 360bbb-3(b)(1), unless the authorization is terminated or revoked sooner.  Performed at [facility], [place] [address] [place], [place] [address]   SARS CORONAVIRUS 2 (TAT 6-24 HRS) Nasopharyngeal Nasopharyngeal Swab     Status: None   Collection Time: 05/06/20  2:19 PM   Specimen: Nasopharyngeal Swab  Result Value Ref Range Status   SARS Coronavirus 2 NEGATIVE NEGATIVE Final    Comment: (NOTE) SARS-CoV-2 target nucleic acids are NOT DETECTED.  The SARS-CoV-2 RNA is generally  detectable in upper and lower respiratory specimens during the acute phase of infection. Negative results do not preclude SARS-CoV-2 infection, do not rule out co-infections with other pathogens, and should not be used as the sole basis for treatment or other patient management decisions. Negative results must be combined with clinical observations, patient history, and epidemiological information. The expected result is Negative.  Fact Sheet for Patients: SugarRoll.be  Fact Sheet for Healthcare Providers: https://www.woods-mathews.com/  This test is not yet approved or cleared by the [place] FDA and  has been authorized for detection and/or diagnosis of SARS-CoV-2 by FDA under an Emergency Use Authorization (EUA). This EUA will remain  in effect (meaning this test can be used) for the duration of the COVID-19 declaration under Se ction 564(b)(1) of the Act, 21 U.S.C. section 360bbb-3(b)(1), unless the authorization is terminated or revoked sooner.  Performed at [facility], [place] [address], [place], [place] [address]  Labs: BNP (last 3 results) Recent Labs    04/30/20 0847  BNP 950.9*   Basic Metabolic Panel: Recent Labs  Lab 05/01/20 0218 05/02/20 0943 05/04/20 0603 05/05/20 0354  NA 141 140 141 143  K 3.2* 4.0 3.4* 4.0  CL 102 106 105 108  CO2 27 23 27 25   GLUCOSE 69* 98 77 87  BUN 19 16 12 17   CREATININE 0.87 0.85 0.86 0.83  CALCIUM 8.7* 8.4* 8.5* 9.0   Liver Function Tests: Recent Labs  Lab 05/02/20 0943 05/04/20 0603 05/05/20 0354  AST 16 13* 11*  ALT 8 <5 <5  ALKPHOS 74 74 76  BILITOT 1.5* 1.7* 1.7*  PROT 6.0* 6.1* 6.3*  ALBUMIN 3.3* 3.3* 3.4*   No results for input(s): LIPASE, AMYLASE in the last 168 hours. No results for input(s): AMMONIA in the last 168 hours. CBC: Recent Labs  Lab 05/01/20 0218 05/02/20 0943 05/04/20 0603  WBC 9.9 9.9 8.3  HGB 16.0 15.2 15.0  HCT 50.8 47.4 46.0   MCV 90.2 89.8 89.1  PLT 140* 138* 132*   Cardiac Enzymes: No results for input(s): CKTOTAL, CKMB, CKMBINDEX, TROPONINI in the last 168 hours. BNP: Invalid input(s): POCBNP CBG: No results for input(s): GLUCAP in the last 168 hours. D-Dimer No results for input(s): DDIMER in the last 72 hours. Hgb A1c No results for input(s): HGBA1C in the last 72 hours. Lipid Profile No results for input(s): CHOL, HDL, LDLCALC, TRIG, CHOLHDL, LDLDIRECT in the last 72 hours. Thyroid function studies No results for input(s): TSH, T4TOTAL, T3FREE, THYROIDAB in the last 72 hours.  Invalid input(s): FREET3 Anemia work up No results for input(s): VITAMINB12, FOLATE, FERRITIN, TIBC, IRON, RETICCTPCT in the last 72 hours. Urinalysis    Component Value Date/Time   COLORURINE AMBER (A) 04/30/2020 1013   APPEARANCEUR CLEAR (A) 04/30/2020 1013   APPEARANCEUR Clear 09/03/2018 1119   LABSPEC 1.023 04/30/2020 1013   PHURINE 5.0 04/30/2020 1013   GLUCOSEU NEGATIVE 04/30/2020 1013   HGBUR NEGATIVE 04/30/2020 1013   BILIRUBINUR NEGATIVE 04/30/2020 [place] 04/20/2020 1448   BILIRUBINUR Negative 09/03/2018 1119   KETONESUR 5 (A) 04/30/2020 1013   PROTEINUR NEGATIVE 04/30/2020 1013   UROBILINOGEN 0.2 04/20/2020 1448   NITRITE NEGATIVE 04/30/2020 1013   LEUKOCYTESUR NEGATIVE 04/30/2020 1013   Sepsis Labs Invalid input(s): PROCALCITONIN,  WBC,  LACTICIDVEN Microbiology Recent Results (from the past 240 hour(s))  SARS CORONAVIRUS 2 (TAT 6-24 HRS) Nasopharyngeal Nasopharyngeal Swab     Status: None   Collection Time: 04/30/20  2:28 PM   Specimen: Nasopharyngeal Swab  Result Value Ref Range Status   SARS Coronavirus 2 NEGATIVE NEGATIVE Final    Comment: (NOTE) SARS-CoV-2 target nucleic acids are NOT DETECTED.  The SARS-CoV-2 RNA is generally detectable in upper and lower respiratory specimens during the acute phase of infection. Negative results do not preclude SARS-CoV-2 infection, do  not rule out co-infections with other pathogens, and should not be used as the sole basis for treatment or other patient management decisions. Negative results must be combined with clinical observations, patient history, and epidemiological information. The expected result is Negative.  Fact Sheet for Patients: SugarRoll.be  Fact Sheet for Healthcare Providers: https://www.woods-mathews.com/  This test is not yet approved or cleared by the [place] FDA and  has been authorized for detection and/or diagnosis of SARS-CoV-2 by FDA under an Emergency Use Authorization (EUA). This EUA will remain  in effect (meaning this test can be used) for the duration  of the COVID-19 declaration under Se ction 564(b)(1) of the Act, 21 U.S.C. section 360bbb-3(b)(1), unless the authorization is terminated or revoked sooner.  Performed at [facility], [place] [address], [place], [place] [address]   SARS CORONAVIRUS 2 (TAT 6-24 HRS) Nasopharyngeal Nasopharyngeal Swab     Status: None   Collection Time: 05/06/20  2:19 PM   Specimen: Nasopharyngeal Swab  Result Value Ref Range Status   SARS Coronavirus 2 NEGATIVE NEGATIVE Final    Comment: (NOTE) SARS-CoV-2 target nucleic acids are NOT DETECTED.  The SARS-CoV-2 RNA is generally detectable in upper and lower respiratory specimens during the acute phase of infection. Negative results do not preclude SARS-CoV-2 infection, do not rule out co-infections with other pathogens, and should not be used as the sole basis for treatment or other patient management decisions. Negative results must be combined with clinical observations, patient history, and epidemiological information. The expected result is Negative.  Fact Sheet for Patients: SugarRoll.be  Fact Sheet for Healthcare Providers: https://www.woods-mathews.com/  This test is not yet approved or cleared by  the [place] FDA and  has been authorized for detection and/or diagnosis of SARS-CoV-2 by FDA under an Emergency Use Authorization (EUA). This EUA will remain  in effect (meaning this test can be used) for the duration of the COVID-19 declaration under Se ction 564(b)(1) of the Act, 21 U.S.C. section 360bbb-3(b)(1), unless the authorization is terminated or revoked sooner.  Performed at [facility], [place] [address], [place], [place] [address]      Time coordinating discharge: 35 minutes [place] controlled substances registry was reviewed for this patient prior to filling the <5 days supply controlled substances script.      SIGNED:   Edwin Dada, MD  [facility] 05/07/2020, 10:13 AM

## 2020-05-07 NOTE — TOC Progression Note (Addendum)
Transition of Care [facility]) - Progression Note    Patient Details  Name: Andre [name] MRN: [id number] Date of Birth: 1935-08-27  Transition [facility]) CM/SW [place], LCSW Phone Number: 05/07/2020, 9:19 AM  Clinical Narrative: [employer] is denying SNF and EMS authorization due to not meeting Medicare skilled criteria. Patient walked 100 feet with PT yesterday and then 260 feet with the mobility tech. MD has agreed to complete peer-to-peer review and has left a voicemail for the medical director, Dr. Amalia Hailey. [name] is aware. She is agreeable to home health, no agency preference. She asked that we order a walker. She is aware that patient will discharge home today if still denied by insurance.  9:42 am: [facility] and [facility] are checking to see if they can accept for home health. [facility] and [facility] are unable to accept. Left messages for [facility] and [facility].   10:15 am: Per MD, peer-to-peer review has been completed and Dr. Amalia Hailey said he would approve SNF placement. MD will call wife to notify. SNF admissions coordinator is aware. Will notify her once authorization details have been provided.  Expected Discharge Plan: [place] Barriers to Discharge: Continued Medical Work up  Expected Discharge Plan and Services Expected Discharge Plan: [place] In-house Referral: Clinical Social Work   Post Acute Care Choice: [place] Living arrangements for the past 2 months: Single Family Home Expected Discharge Date: 05/07/20                                     Social Determinants of Health (SDOH) Interventions    Readmission Risk Interventions No flowsheet data found.

## 2020-05-07 NOTE — TOC Transition Note (Signed)
Transition of Care [facility]) - CM/SW Discharge Note   Patient Details  Name: SOLAN VOSLER MRN: [id number] Date of Birth: [birth year]  Transition of Care [facility]) CM/SW Contact:  Candie Chroman, LCSW Phone Number: 05/07/2020, 11:49 AM   Clinical Narrative: Patient has orders to discharge to [facility] today. RN will call report to [phone]. EMS transport arranged for 2:00 per SNF request. No further concerns. CSW signing off.    Final next level of care: [place] Barriers to Discharge: Barriers Resolved   Patient Goals and CMS Choice Patient states their goals for this hospitalization and ongoing recovery are:: to get better   Choice offered to / list presented to : Spouse  Discharge Placement              Patient chooses bed at: [facility] Patient to be transferred to facility by: EMS Name of family member notified: [place] Stenzel Patient and family notified of of transfer: 05/07/20  Discharge Plan and Services In-house Referral: Clinical Social Work   Post Acute Care Choice: [place]                               Social Determinants of Health (SDOH) Interventions     Readmission Risk Interventions No flowsheet data found.

## 2020-05-07 NOTE — Progress Notes (Signed)
Andre [name] to be D/C'd [facility] per MD order. Report called to [facility] and given to [employer] Medication list placed in discharge packet. Answered all of nurse's questions.  Allergies as of 05/07/2020       Reactions   Pravastatin Other (See Comments)   Prednisone Other (See Comments)   Pt states that med makes him hyper Pt states that med makes him hyper        Medication List     STOP taking these medications    albuterol (2.5 MG/3ML) 0.083% nebulizer solution Commonly known as: PROVENTIL Replaced by: albuterol 108 (90 Base) MCG/ACT inhaler You also have another medication with the same name that you need to continue taking as instructed.       TAKE these medications    buPROPion 150 MG 12 hr tablet Commonly known as: Wellbutrin SR Take 1 tablet (150 mg total) by mouth 2 (two) times daily.   carbidopa-levodopa 25-250 MG tablet Commonly known as: SINEMET IR TAKE TWO TABLETS 3 TIMES DAILY What changed: See the new instructions.   carvedilol 3.125 MG tablet Commonly known as: COREG TAKE TWO TABLETS TWICE A DAY WITH MEALS What changed: See the new instructions.   Eliquis 5 MG Tabs tablet Generic drug: apixaban TAKE ONE TABLET BY MOUTH TWICE DAILY What changed: how much to take   entacapone 200 MG tablet Commonly known as: COMTAN Take 1 tablet (200 mg total) by mouth 3 (three) times daily.   esomeprazole 40 MG capsule Commonly known as: NEXIUM TAKE 1 CAPSULE BY MOUTH ONCE DAILY   feeding supplement Liqd Take 237 mLs by mouth 3 (three) times daily between meals.   fluticasone 50 MCG/ACT nasal spray Commonly known as: FLONASE USE 2 PUFFS IN EACH NOSTRIL DAILY   Flutter Devi 1 Device by Does not apply route daily.   furosemide 20 MG tablet Commonly known as: LASIX Take 1 tablet (20 mg total) by mouth daily. What changed: how much to take   HYDROcodone-acetaminophen 5-325 MG tablet Commonly known as: NORCO/VICODIN Take 1 tablet by  mouth every 6 (six) hours as needed for moderate pain or severe pain.   hydrocortisone 2.5 % lotion Apply topically daily. Apply to affected areas 4 times per week.   ketoconazole 2 % cream Commonly known as: NIZORAL Apply 1 application topically daily as needed for irritation.   ketoconazole 2 % shampoo Commonly known as: NIZORAL Apply 1 application topically 3 (three) times a week. Wash scalp, face and ears   ketorolac 0.5 % ophthalmic solution Commonly known as: ACULAR SMARTSIG:1 Drop(s) In Eye(s) Every 4-6 Hours PRN   lactulose 10 GM/15ML solution Commonly known as: CHRONULAC Take 30 mLs (20 g total) by mouth 2 (two) times daily as needed for moderate constipation.   levothyroxine 50 MCG tablet Commonly known as: SYNTHROID TAKE ONE TABLET ON AN EMPTY STOMACH WITHA GLASS OF WATER AT LEAST 30 TO 60 MINUTES BEFORE BREAKFAST What changed: See the new instructions.   losartan 100 MG tablet Commonly known as: COZAAR TAKE 1 TABLET BY MOUTH DAILY   mometasone 0.1 % cream Commonly known as: ELOCON   mupirocin ointment 2 % Commonly known as: Bactroban Apply 1 application topically 3 (three) times daily. What changed:  when to take this reasons to take this   potassium chloride 10 MEQ tablet Commonly known as: KLOR-CON Take 1 tablet (10 mEq total) by mouth daily.   ProAir HFA 108 (90 Base) MCG/ACT inhaler Generic drug: albuterol INHALE 2  PUFFS INTO THE LUNGS EVERY SIX HOURS AS NEEDED FOR WHEEZING OR SHORTNESS OF BREATH What changed:  Another medication with the same name was added. Make sure you understand how and when to take each. Another medication with the same name was removed. Continue taking this medication, and follow the directions you see here.   albuterol 108 (90 Base) MCG/ACT inhaler Commonly known as: VENTOLIN HFA Inhale 2 puffs into the lungs every 6 (six) hours as needed for wheezing or shortness of breath. What changed: You were already taking a  medication with the same name, and this prescription was added. Make sure you understand how and when to take each. Replaces: albuterol (2.5 MG/3ML) 0.083% nebulizer solution   QUEtiapine 25 MG tablet Commonly known as: SEROQUEL Take 1 tablet (25 mg total) by mouth at bedtime.   rosuvastatin 10 MG tablet Commonly known as: CRESTOR TAKE 1 TABLET BY MOUTH DAILY   sertraline 50 MG tablet Commonly known as: ZOLOFT Take 1 tablet (50 mg total) by mouth daily.   Stiolto Respimat 2.5-2.5 MCG/ACT Aers Generic drug: Tiotropium Bromide-Olodaterol Inhale 2 puffs into the lungs daily.        Vitals:   05/07/20 0751 05/07/20 1352  BP: (!) 146/86 (!) 110/59  Pulse: 74 83  Resp: 16 20  Temp: (!) 97.5 F (36.4 C) 97.8 F (36.6 C)  SpO2: 97% 98%    Skin clean, dry and intact without evidence of skin break down, no evidence of skin tears noted. IV catheter discontinued intact. Site without signs and symptoms of complications. Dressing and pressure applied. Pt denies pain at this time. No complaints noted.  An After Visit Summary was printed and given to the patient. Patient escorted via [place], and D/C to SNF via EMS  [facility]

## 2020-05-08 NOTE — Telephone Encounter (Signed)
Pt didn't like [facility] facility so they left and came home. He is doing better with his walker. She is going to try to get in home therapy. And she is wondering when she can reschedule for B12 injection? Is it ok to go ahead and schedule appt? She said he should be ok to come to our office and get it. I just want to make sure.

## 2020-05-08 NOTE — Telephone Encounter (Signed)
Dr. Vaughan Browner please advise on download, thanks!

## 2020-05-08 NOTE — Telephone Encounter (Signed)
Patient would like a referral for home PT he has left the facility because he did not like it and he is home.  Varetta Chavers,cma

## 2020-05-08 NOTE — Telephone Encounter (Signed)
Pt is scheduled for 05/21/20 for B12 injection. Pt's wife wants to know if Dr. [name] would refer them for in home physical therapy for pt?

## 2020-05-08 NOTE — Telephone Encounter (Signed)
The patient was tested for covid 2 days ago and he was negative so just allow 14 days and he can come in and get his B12 injection.  Andre [name],cma

## 2020-05-11 NOTE — Telephone Encounter (Signed)
Download has been printed and given to Dr. Vaughan Browner to review.

## 2020-05-11 NOTE — Addendum Note (Signed)
Addended by: [name] on: 05/11/2020 04:37 PM   Modules accepted: Orders

## 2020-05-11 NOTE — Telephone Encounter (Signed)
Referral placed.

## 2020-05-11 NOTE — Telephone Encounter (Deleted)
I do not recall seeing a download.  Would you mind printing up again for me please.  Thank you

## 2020-05-12 ENCOUNTER — Other Ambulatory Visit: Payer: Self-pay | Admitting: Internal Medicine

## 2020-05-13 ENCOUNTER — Ambulatory Visit: Payer: PPO | Admitting: Dermatology

## 2020-05-18 ENCOUNTER — Other Ambulatory Visit: Payer: Self-pay | Admitting: Family Medicine

## 2020-05-18 ENCOUNTER — Other Ambulatory Visit: Payer: Self-pay | Admitting: Cardiovascular Disease

## 2020-05-18 DIAGNOSIS — F419 Anxiety disorder, unspecified: Secondary | ICD-10-CM

## 2020-05-18 DIAGNOSIS — H25813 Combined forms of age-related cataract, bilateral: Secondary | ICD-10-CM | POA: Diagnosis not present

## 2020-05-18 DIAGNOSIS — F32A Depression, unspecified: Secondary | ICD-10-CM

## 2020-05-18 NOTE — Telephone Encounter (Signed)
Please advise alternative for Rosuvastatin 10 mg qd. Unable to suggest alternative for preferred formulary selections prompted during trying to fill medication.  rosuvastatin (CRESTOR) 10 MG tablet [Pharmacy Med Name: ROSUVASTATIN CALCIUM 10 MG TAB] is not on the preferred formulary for the patient's insurance plan. Below are alternatives which are likely to be more affordable. Do not assume that every medication presented is a clinically appropriate alternative.  These alternatives are medications that are in the same pharmaceutical subclass (H[employer] CoA Reductase Inhibitors) as the ordered medication and are on formulary for the patient's insurance plan.

## 2020-05-19 ENCOUNTER — Telehealth: Payer: Self-pay | Admitting: Family Medicine

## 2020-05-19 ENCOUNTER — Telehealth: Payer: Self-pay | Admitting: Neurology

## 2020-05-19 MED ORDER — BUPROPION HCL ER (XL) 300 MG PO TB24: 300.0000 mg | ORAL_TABLET | Freq: Every day | ORAL | 1 refills | Status: DC

## 2020-05-19 NOTE — Telephone Encounter (Signed)
I called and talk with the wife.  The patient went to the rehab facility but did not stay for more than a day, came home, is getting home health rehab.  The patient remains quite drowsy, sleeping all day.  The wife is try to contact the pulmonary doctor but has not had much success getting in touch with anyone.  The patient has sleep apnea on CPAP, need to determine that this is working properly for him.

## 2020-05-19 NOTE — Telephone Encounter (Signed)
Left message for patient to call back and schedule Medicare Annual Wellness Visit (AWV)   This should be a virtual visit only=30 minutes.  Last AWV 12/31/18; please schedule at anytime with Denisa O'Brien-Blaney at [facility].

## 2020-05-19 NOTE — Telephone Encounter (Signed)
The patient's wife requested a refill of Wellbutrin while she was in the office.  She also noted that the patient has been having more dementia issues and does not seem to understand things as much as he was previously when he talks to his physicians.  He does have Parkinson's and I did discuss that that could be contributing.  She does note she has power of attorney though she is unsure if she has medical power of attorney.  I encouraged her to look into that so they can possibly get that in place in case his memory issues worsen.

## 2020-05-19 NOTE — Telephone Encounter (Signed)
Wife asking for a call to discuss pt's Parkinson's worsening (she declined  scheduling  an earlier appointment, just asking for a call)

## 2020-05-19 NOTE — Telephone Encounter (Signed)
I called the wife, I left messages, I will call back later.

## 2020-05-19 NOTE — Telephone Encounter (Signed)
Pt.'s wife called back & I informed her that Dr. will call back later.

## 2020-05-20 ENCOUNTER — Telehealth: Payer: Self-pay | Admitting: Pulmonary Disease

## 2020-05-20 ENCOUNTER — Ambulatory Visit: Payer: Self-pay | Admitting: Neurology

## 2020-05-20 NOTE — Telephone Encounter (Signed)
Called and spoke with pt's wife [name] who stated pt has been sleeping more often. [name] states that pt wears his machine about 8 hours a night every night but even after wearing it all night, pt is still sleeping a lot during the day.  Due to pt sleeping some days until 2:30 in the afternoon after going to bed around 8:00 he will still sleep that long.  Pt's neurologist wanted pt to contact office due to how long pt is sleeping as pt is sleeping longer than 8 hours a day to get recommendations.  Dr. Vaughan Browner, please advise.

## 2020-05-21 ENCOUNTER — Ambulatory Visit: Payer: HMO

## 2020-05-21 DIAGNOSIS — M5382 Other specified dorsopathies, cervical region: Secondary | ICD-10-CM | POA: Diagnosis not present

## 2020-05-21 DIAGNOSIS — M5136 Other intervertebral disc degeneration, lumbar region: Secondary | ICD-10-CM | POA: Diagnosis not present

## 2020-05-21 DIAGNOSIS — K219 Gastro-esophageal reflux disease without esophagitis: Secondary | ICD-10-CM | POA: Diagnosis not present

## 2020-05-21 DIAGNOSIS — I2510 Atherosclerotic heart disease of native coronary artery without angina pectoris: Secondary | ICD-10-CM | POA: Diagnosis not present

## 2020-05-21 DIAGNOSIS — J8410 Pulmonary fibrosis, unspecified: Secondary | ICD-10-CM | POA: Diagnosis not present

## 2020-05-21 DIAGNOSIS — R7303 Prediabetes: Secondary | ICD-10-CM | POA: Diagnosis not present

## 2020-05-21 DIAGNOSIS — D649 Anemia, unspecified: Secondary | ICD-10-CM | POA: Diagnosis not present

## 2020-05-21 DIAGNOSIS — E785 Hyperlipidemia, unspecified: Secondary | ICD-10-CM | POA: Diagnosis not present

## 2020-05-21 DIAGNOSIS — G4710 Hypersomnia, unspecified: Secondary | ICD-10-CM | POA: Diagnosis not present

## 2020-05-21 DIAGNOSIS — E860 Dehydration: Secondary | ICD-10-CM | POA: Diagnosis not present

## 2020-05-21 DIAGNOSIS — I700 Atherosclerosis of aorta: Secondary | ICD-10-CM | POA: Diagnosis not present

## 2020-05-21 DIAGNOSIS — K2270 Barrett's esophagus without dysplasia: Secondary | ICD-10-CM | POA: Diagnosis not present

## 2020-05-21 DIAGNOSIS — J309 Allergic rhinitis, unspecified: Secondary | ICD-10-CM | POA: Diagnosis not present

## 2020-05-21 DIAGNOSIS — G4733 Obstructive sleep apnea (adult) (pediatric): Secondary | ICD-10-CM | POA: Diagnosis not present

## 2020-05-21 DIAGNOSIS — J449 Chronic obstructive pulmonary disease, unspecified: Secondary | ICD-10-CM | POA: Diagnosis not present

## 2020-05-21 DIAGNOSIS — I5032 Chronic diastolic (congestive) heart failure: Secondary | ICD-10-CM | POA: Diagnosis not present

## 2020-05-21 DIAGNOSIS — F0280 Dementia in other diseases classified elsewhere without behavioral disturbance: Secondary | ICD-10-CM | POA: Diagnosis not present

## 2020-05-21 DIAGNOSIS — D696 Thrombocytopenia, unspecified: Secondary | ICD-10-CM | POA: Diagnosis not present

## 2020-05-21 DIAGNOSIS — M5030 Other cervical disc degeneration, unspecified cervical region: Secondary | ICD-10-CM | POA: Diagnosis not present

## 2020-05-21 DIAGNOSIS — I480 Paroxysmal atrial fibrillation: Secondary | ICD-10-CM | POA: Diagnosis not present

## 2020-05-21 DIAGNOSIS — G20 Parkinson's disease: Secondary | ICD-10-CM | POA: Diagnosis not present

## 2020-05-21 DIAGNOSIS — R531 Weakness: Secondary | ICD-10-CM | POA: Diagnosis not present

## 2020-05-21 DIAGNOSIS — I110 Hypertensive heart disease with heart failure: Secondary | ICD-10-CM | POA: Diagnosis not present

## 2020-05-21 DIAGNOSIS — F418 Other specified anxiety disorders: Secondary | ICD-10-CM | POA: Diagnosis not present

## 2020-05-21 DIAGNOSIS — E039 Hypothyroidism, unspecified: Secondary | ICD-10-CM | POA: Diagnosis not present

## 2020-05-22 ENCOUNTER — Ambulatory Visit: Payer: HMO

## 2020-05-22 NOTE — Telephone Encounter (Signed)
Pt has an appt with Derl Barrow, NP, on 2.8.22. Will forward to her and Raquel Sarna regarding the download.

## 2020-05-22 NOTE — Telephone Encounter (Signed)
Andre [name] wife is returning phone call. Andre [name] phone number is [phone]. 

## 2020-05-22 NOTE — Telephone Encounter (Signed)
Please arrange office visit to review CPAP download and respiratory status. I am not sure why he is sleeping more and will need an in person assessment.

## 2020-05-22 NOTE — Telephone Encounter (Signed)
Lm for patient's spouse, [name] [facility]).

## 2020-05-22 NOTE — Telephone Encounter (Signed)
Spoke with patient's wife. Patient has been scheduled for an OV with Beth on 05/26/20 at 230pm. She verbalized understanding. Nothing further needed at time of call.

## 2020-05-22 NOTE — Telephone Encounter (Signed)
Original download that was printed is in Dr. Matilde Bash lookat folder which we are unable to locate. New updated download can be printed from [facility] when chart checks are done the day before pt's appt.

## 2020-05-25 ENCOUNTER — Ambulatory Visit (INDEPENDENT_AMBULATORY_CARE_PROVIDER_SITE_OTHER): Payer: HMO

## 2020-05-25 ENCOUNTER — Telehealth: Payer: Self-pay

## 2020-05-25 ENCOUNTER — Other Ambulatory Visit: Payer: Self-pay

## 2020-05-25 DIAGNOSIS — E538 Deficiency of other specified B group vitamins: Secondary | ICD-10-CM | POA: Diagnosis not present

## 2020-05-25 DIAGNOSIS — R300 Dysuria: Secondary | ICD-10-CM | POA: Diagnosis not present

## 2020-05-25 MED ORDER — CYANOCOBALAMIN 1000 MCG/ML IJ SOLN
1000.0000 ug | Freq: Once | INTRAMUSCULAR | Status: AC
  Administered 2020-05-25: 1000 ug via INTRAMUSCULAR

## 2020-05-25 NOTE — Addendum Note (Signed)
Addended by: Leeanne Rio on: 05/25/2020 03:25 PM   Modules accepted: Orders

## 2020-05-25 NOTE — Progress Notes (Signed)
Patient presented for B 12 injection to left, patient voiced no concerns nor showed any signs of distress during injection.

## 2020-05-26 ENCOUNTER — Telehealth (INDEPENDENT_AMBULATORY_CARE_PROVIDER_SITE_OTHER): Payer: HMO | Admitting: Internal Medicine

## 2020-05-26 ENCOUNTER — Telehealth: Payer: Self-pay

## 2020-05-26 ENCOUNTER — Ambulatory Visit: Payer: HMO | Admitting: Primary Care

## 2020-05-26 ENCOUNTER — Encounter: Payer: Self-pay | Admitting: Primary Care

## 2020-05-26 ENCOUNTER — Encounter: Payer: Self-pay | Admitting: Internal Medicine

## 2020-05-26 ENCOUNTER — Telehealth: Payer: HMO | Admitting: Internal Medicine

## 2020-05-26 VITALS — BP 128/78 | Ht 67.0 in | Wt 196.0 lb

## 2020-05-26 VITALS — BP 120/74 | HR 81 | Temp 98.7°F | Ht 67.0 in | Wt 202.6 lb

## 2020-05-26 DIAGNOSIS — N3000 Acute cystitis without hematuria: Secondary | ICD-10-CM

## 2020-05-26 DIAGNOSIS — J449 Chronic obstructive pulmonary disease, unspecified: Secondary | ICD-10-CM | POA: Diagnosis not present

## 2020-05-26 DIAGNOSIS — G4733 Obstructive sleep apnea (adult) (pediatric): Secondary | ICD-10-CM

## 2020-05-26 DIAGNOSIS — G4719 Other hypersomnia: Secondary | ICD-10-CM | POA: Diagnosis not present

## 2020-05-26 DIAGNOSIS — J8410 Pulmonary fibrosis, unspecified: Secondary | ICD-10-CM | POA: Diagnosis not present

## 2020-05-26 LAB — URINALYSIS, ROUTINE W REFLEX MICROSCOPIC
Bilirubin Urine: NEGATIVE
Hgb urine dipstick: NEGATIVE
Nitrite: NEGATIVE
RBC / HPF: NONE SEEN (ref 0–?)
Specific Gravity, Urine: 1.02 (ref 1.000–1.030)
Total Protein, Urine: NEGATIVE
Urine Glucose: NEGATIVE
Urobilinogen, UA: 0.2 (ref 0.0–1.0)
pH: 6 (ref 5.0–8.0)

## 2020-05-26 LAB — URINE CULTURE
MICRO NUMBER:: 11503059
SPECIMEN QUALITY:: ADEQUATE

## 2020-05-26 MED ORDER — CIPROFLOXACIN HCL 500 MG PO TABS: 500.0000 mg | ORAL_TABLET | Freq: Two times a day (BID) | ORAL | 0 refills | Status: DC

## 2020-05-26 NOTE — Patient Instructions (Addendum)
Continue to wear CPAP night, try changing masks   Orders: ABGs and BMET  Follow-up 1-2 months with Dr. Vaughan Browner

## 2020-05-26 NOTE — Progress Notes (Signed)
@Patient  ID: Andre [name], male    DOB: 1936-04-18, 85 y.o.   MRN: [id number]  Chief Complaint  Patient presents with  . Follow-up    Pt is still having complaints of sleeping more throughout the day even with wearing CPAP at night. Pt denies any complaints of cough, SOB, or chest pain.    Referring provider: [name], MD  HPI: 85 year old with history of pulmonary fibrosis, recurrent respiratory failure, eosinophilic pleural effusion, chronic diastolic heart failure, sleep apnea, Parkinson's,  Previously followed [place] [facility].  He was also evaluated at [facility] for pulmonary fibrosis thought to be secondary to prior asbestos exposure.  Recommendation was made to follow-up with a CT in January 2020.  However he cannot keep the appointment due to high co-pay and wishes to transfer care to [facility].  Initially on Anoro.  This was changed to [facility] in May 2020 as he did not achieve adequate inspiratory flow.Treated with antibiotics in December 2020 with chest x-ray showing right lung pneumonia for which he was treated with antibiotics. Follow-up CT scan shows resolution of pneumonia but shows progression of pulmonary fibrosis.   Started on Ofev 05/27/19.    Pets: No pets Occupation: Worked as a Actor ILD questionnaire 05/15/2018: Exposure to asbestos, no other significant exposure. Smoking history:20-pack-year smoker.  Quit smoking in 1985 Travel history: No significant recent travel Relevant family history: No family history of lung disease.   Previous [facility] pulmonary encounter: Dickey Gave has been poorly tolerated through the CLL in spite of lowering the dose.  He is finally stopped it 2 weeks ago  He had a 50 pound weight loss on the medication and has a 50 pound weight loss. However states that breathing is doing better   05/26/2020 - interim hx Patient presents today to follow-up on daytime sleepiness. Patient neurologist wanted pulmonary to follow-up on CPAP  compliance. He was in the hospital for 7 days in January otherwise his compliance with CPAP is 100%. He wears CPAP machine on average 8 hours a night. Family states that he is sleeping a lot during the day, sometimes until 2:30 in the afternoon. He goes to bed between 7-9pm and will wake up around 2:30am confused/aggitated. He gets out of bed around 8-9am. He is constantly asking when he can go back to bed. He feels sleepy in the morning. Wife is worried about dementia. Epworth 22.  Working with physical therapy at home. He has been off Ofev d/t poor tolerance. He occasionally gets winded but no significant shortness of breath. No symptoms of chest tightness, wheezing or cough. mMRC dyspnea score 1.   Airview Download 04/26/20-05/25/20 (patient was in hospital January) 22/30 days; 70% > 4 hours Average usage 9 hours 13 mins Pressure 12 cm h20 Airleaks 88.3L/min (95%) AHI 2.9    Allergies  Allergen Reactions  . Pravastatin Other (See Comments)  . Prednisone Other (See Comments)    Pt states that med makes him hyper Pt states that med makes him hyper    Immunization History  Administered Date(s) Administered  . Fluad Quad(high Dose 65+) 12/11/2018, 12/24/2019  . Influenza Split 03/18/2012  . Influenza, High Dose Seasonal PF 01/13/2016, 01/26/2017, 12/29/2017  . Influenza-Unspecified 01/06/2016, 01/26/2017  . PFIZER(Purple Top)SARS-COV-2 Vaccination 05/10/2019, 05/31/2019, 02/08/2020  . Pneumococcal Conjugate-13 05/10/2016  . Td 02/08/2017  . Zoster 04/19/2011  . Zoster Recombinat (Shingrix) 12/07/2017, 04/02/2018    Past Medical History:  Diagnosis Date  . Atherosclerosis of abdominal aorta ([place])   . Basal  cell carcinoma 03/04/2008   Right nose supratip.   [name] CAD (coronary artery disease)   . Cervical spondylosis 10/01/2013  . Chronic diastolic CHF (congestive heart failure) ([place])    a. 07/2016 Echo: >55%; b. 10/2016 Echo: EF 55-60%, Gr1 DD, Ao sclerosis w/o stenosis, sev dil LA; c.  08/2017 Echo: EF 60-65%, no rwma, Gr2 DD, mild AS, sev dil LA/RA.  [name] Coronary artery disease    a. 1998 s/p mini-cabg @ [facility] - LIMA->LAD;  b. 07/2016 St Echo: Inadequate HR w/ HTN response;  c.  08/2016 MV: EF 67%, no ischemia; d. 10/2016 NSTEMI/Cath: RCA 95p (4.0x26 Onyx DES), LIMA->LAD nl; e. 09/2017 Cath: LM 40/30, LAD 100ost, RI 80, LCX nl, OM2/3 nl, RCA patent stent, 39m, LIMA->LAD nl-->Med Rx.  . DDD (degenerative disc disease), cervical   . DDD (degenerative disc disease), lumbar   . Depression   . Gait abnormality 07/31/2019  . GERD (gastroesophageal reflux disease)   . Hyperlipidemia   . Hypertension   . Hypothyroidism   . PAF (paroxysmal atrial fibrillation) (HCC)    a. s/p DCCV-->maintaining sinus on amiodarone;  b. CHA2DS2VASc = 5-->eliquis.  . Parkinson's disease ([place])    tremors  . Pleural effusion, right    a. 09/2017 s/p thoracentesis.  [name] PNA (pneumonia) 08/26/2017  . Pulmonary embolism ([place]) 2011  . Pulmonary fibrosis ([place])   . 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 Sleep apnea    wears CPAP  . Squamous cell carcinoma of skin 03/19/2015   Right lateral crown. KA-like pattern  . Thrombocytopenia ([place])     Tobacco History: Social History   Tobacco Use  Smoking Status Former Smoker  . Packs/day: 1.00  . Years: 10.00  . Pack years: 10.00  . Types: Cigarettes, Pipe, Cigars  . Quit date: 04/18/1972  . Years since quitting: 48.1  Smokeless Tobacco Former Systems developer  . Types: Chew  . Quit date: 04/18/1972   Counseling given: Not Answered   Outpatient Medications Prior to Visit  Medication Sig Dispense Refill  . albuterol (VENTOLIN HFA) 108 (90 Base) MCG/ACT inhaler Inhale 2 puffs into the lungs every 6 (six) hours as needed for wheezing or shortness of breath. 8 g 2  . buPROPion (WELLBUTRIN XL) 300 MG 24 hr tablet Take 1 tablet (300 mg total) by mouth daily. 90 tablet 1  . carbidopa-levodopa (SINEMET IR) 25-250 MG tablet TAKE TWO TABLETS 3 TIMES DAILY (Patient taking  differently: Take 2 tablets by mouth 3 (three) times daily.) 540 tablet 3  . carvedilol (COREG) 3.125 MG tablet TAKE TWO TABLETS TWICE A DAY WITH MEALS (Patient taking differently: Take 6.25 mg by mouth 2 (two) times daily with a meal.) 120 tablet 3  . ELIQUIS 5 MG TABS tablet TAKE ONE TABLET BY MOUTH TWICE DAILY (Patient taking differently: Take 5 mg by mouth 2 (two) times daily.) 60 tablet 5  . entacapone (COMTAN) 200 MG tablet Take 1 tablet (200 mg total) by mouth 3 (three) times daily. 270 tablet 3  . esomeprazole (NEXIUM) 40 MG capsule TAKE 1 CAPSULE BY MOUTH ONCE DAILY 30 capsule 2  . feeding supplement (ENSURE ENLIVE / ENSURE PLUS) LIQD Take 237 mLs by mouth 3 (three) times daily between meals. 237 mL 12  . fluticasone (FLONASE) 50 MCG/ACT nasal spray USE 2 PUFFS IN EACH NOSTRIL DAILY 16 g 3  . furosemide (LASIX) 20 MG tablet Take 1 tablet (20 mg total) by mouth daily. 30 tablet   . HYDROcodone-acetaminophen (NORCO/VICODIN) 5-325 MG tablet Take 1  tablet by mouth every 6 (six) hours as needed for moderate pain or severe pain. 5 tablet 0  . hydrocortisone 2.5 % lotion Apply topically daily. Apply to affected areas 4 times per week. 118 mL 3  . ketoconazole (NIZORAL) 2 % cream Apply 1 application topically daily as needed for irritation.    [name] ketoconazole (NIZORAL) 2 % shampoo Apply 1 application topically 3 (three) times a week. Wash scalp, face and ears 120 mL 3  . ketorolac (ACULAR) 0.5 % ophthalmic solution SMARTSIG:1 Drop(s) In Eye(s) Every 4-6 Hours PRN    . lactulose (CHRONULAC) 10 GM/15ML solution TAKE 30 MLS BY MOUTH TWICE DAILY AS NEEDED FOR MODERATE CONSTIPATION 236 mL 0  . levothyroxine (SYNTHROID) 50 MCG tablet TAKE ONE TABLET ON AN EMPTY STOMACH WITHA GLASS OF WATER AT LEAST 30 TO 60 MINUTES BEFORE BREAKFAST (Patient taking differently: Take 50 mcg by mouth daily before breakfast.) 90 tablet 1  . losartan (COZAAR) 100 MG tablet TAKE 1 TABLET BY MOUTH DAILY (Patient taking  differently: Take 100 mg by mouth daily.) 90 tablet 1  . mometasone (ELOCON) 0.1 % cream     . mupirocin ointment (BACTROBAN) 2 % Apply 1 application topically 3 (three) times daily. (Patient taking differently: Apply 1 application topically 3 (three) times daily as needed.) 30 g 2  . potassium chloride SA (KLOR-CON) 10 MEQ tablet Take 1 tablet (10 mEq total) by mouth daily. 90 tablet 1  . PROAIR HFA 108 (90 Base) MCG/ACT inhaler INHALE 2 PUFFS INTO THE LUNGS EVERY SIX HOURS AS NEEDED FOR WHEEZING OR SHORTNESS OF BREATH 8.5 g 1  . Respiratory Therapy Supplies (FLUTTER) DEVI 1 Device by Does not apply route daily. 1 each 0  . rosuvastatin (CRESTOR) 10 MG tablet TAKE 1 TABLET BY MOUTH DAILY 90 tablet 0  . sertraline (ZOLOFT) 50 MG tablet Take 1 tablet (50 mg total) by mouth daily. 30 tablet 3  . Tiotropium Bromide-Olodaterol (STIOLTO RESPIMAT) 2.5-2.5 MCG/ACT AERS Inhale 2 puffs into the lungs daily. 1 g 5  . ciprofloxacin (CIPRO) 500 MG tablet Take 1 tablet (500 mg total) by mouth 2 (two) times daily. With food (Patient not taking: Reported on 05/26/2020) 14 tablet 0  . QUEtiapine (SEROQUEL) 25 MG tablet Take 1 tablet (25 mg total) by mouth at bedtime.     No facility-administered medications prior to visit.    Review of Systems  Review of Systems  Constitutional: Positive for fatigue.  Respiratory: Negative.  Negative for cough, chest tightness and shortness of breath.     Physical Exam  BP 120/74 (BP Location: Right Arm, Cuff Size: Normal)   Pulse 81   Temp 98.7 F (37.1 C) (Other (Comment)) Comment (Src): temporal  Ht 5\' 7"  (1.702 m)   Wt 202 lb 9.6 oz (91.9 kg)   SpO2 98% Comment: RA  BMI 31.73 kg/m  Physical Exam Constitutional:      General: He is not in acute distress.    Appearance: Normal appearance.     Comments: Tired but participating in conversation   Cardiovascular:     Rate and Rhythm: Normal rate. Rhythm irregular.  Pulmonary:     Effort: Pulmonary effort is  normal.     Breath sounds: Rales present.  Musculoskeletal:     Cervical back: Normal range of motion and neck supple.  Skin:    General: Skin is warm and dry.  Neurological:     Mental Status: He is alert. Mental status is at baseline.  Psychiatric:        Mood and Affect: Mood normal.        Behavior: Behavior normal.      Lab Results:  CBC    Component Value Date/Time   WBC 8.3 05/04/2020 0603   RBC 5.16 05/04/2020 0603   HGB 15.0 05/04/2020 0603   HGB 14.9 10/06/2015 0900   HCT 46.0 05/04/2020 0603   HCT 46.4 10/06/2015 0900   PLT 132 (L) 05/04/2020 0603   PLT 142 (L) 10/06/2015 0900   MCV 89.1 05/04/2020 0603   MCV 87 10/06/2015 0900   MCV 86 08/12/2014 0816   MCH 29.1 05/04/2020 0603   MCHC 32.6 05/04/2020 0603   RDW 15.5 05/04/2020 0603   RDW 16.9 (H) 10/06/2015 0900   RDW 14.8 (H) 08/12/2014 0816   LYMPHSABS 2.0 12/09/2019 1308   LYMPHSABS 2.1 10/06/2015 0900   LYMPHSABS 1.6 08/12/2014 0816   MONOABS 0.8 12/09/2019 1308   MONOABS 0.8 08/12/2014 0816   EOSABS 0.1 12/09/2019 1308   EOSABS 0.1 10/06/2015 0900   EOSABS 0.1 08/12/2014 0816   BASOSABS 0.0 12/09/2019 1308   BASOSABS 0.0 10/06/2015 0900   BASOSABS 0.1 08/12/2014 0816    BMET    Component Value Date/Time   NA 143 05/05/2020 0354   NA 139 10/09/2017 1441   K 4.0 05/05/2020 0354   CL 108 05/05/2020 0354   CO2 25 05/05/2020 0354   GLUCOSE 87 05/05/2020 0354   BUN 17 05/05/2020 0354   BUN 23 10/09/2017 1441   CREATININE 0.83 05/05/2020 0354   CREATININE 0.95 12/06/2011 1554   CALCIUM 9.0 05/05/2020 0354   GFRNONAA >60 05/05/2020 0354   GFRNONAA >60 12/06/2011 1554   GFRAA >60 12/09/2019 1308   GFRAA >60 12/06/2011 1554    BNP    Component Value Date/Time   BNP 154.0 (H) 04/30/2020 0847    ProBNP    Component Value Date/Time   PROBNP 526 (H) 03/25/2019 1516   PROBNP 168.0 (H) 09/04/2018 1432    Imaging: DG Chest 2 View  Result Date: 04/30/2020 CLINICAL DATA:  Weakness.  EXAM: CHEST - 2 VIEW COMPARISON:  December 24, 2019. FINDINGS: Stable cardiomegaly. No pneumothorax is noted. Stable interstitial densities and pleural calcifications are noted throughout both lungs. No definite acute abnormality is noted. No significant pleural effusion is noted. Bony thorax is unremarkable. IMPRESSION: Stable interstitial densities and pleural calcifications are noted throughout both lungs consistent with asbestos exposure. Aortic Atherosclerosis (ICD10-I70.0). Electronically Signed   By: Marijo Conception M.D.   On: 04/30/2020 09:34   CT HEAD WO CONTRAST  Result Date: 04/30/2020 CLINICAL DATA:  Mental status change. EXAM: CT HEAD WITHOUT CONTRAST TECHNIQUE: Contiguous axial images were obtained from the base of the skull through the vertex without intravenous contrast. COMPARISON:  MRI head 04/20/2020 FINDINGS: Brain: Generalized atrophy. Negative for hydrocephalus. Patchy white matter hypodensity bilaterally appears chronic and unchanged. Negative for acute infarct or hemorrhage. Small dural based mass in the right lateral temporal lobe consistent with meningioma unchanged from prior MRI. Vascular: Negative for hyperdense vessel Skull: Negative Sinuses/Orbits: Paranasal sinuses clear.  Negative orbit. Other: None IMPRESSION: No acute abnormality Atrophy and chronic microvascular ischemic change Small meningioma right lateral temporal lobe. Electronically Signed   By: Franchot Gallo M.D.   On: 04/30/2020 09:13     Assessment & Plan:   OSA (obstructive sleep apnea) - Significant daytime sleepiness, unclear if due to OSA. Epworth score 22. Wife is concerned about dementia. He  is following with neurology. He is 100% compliant with CPAP wearing on average 9 hours 13 mins. CPAP pressure 12cm h20; residual AHI is 2.9. Recommend he try changing mask d/t airleaks. Will check ABG and BMET.   Pulmonary fibrosis ([place]) - Not on antifibrotics, poorly tolerated OFEV - No significant respiratory  complaints today. mMRC dyspnea score 1     Martyn Ehrich, NP 05/28/2020

## 2020-05-26 NOTE — Progress Notes (Signed)
Patient having symptoms starting Monday. Physical therapist came that day and states that based off Patient's behavior he may have a UTI. Confusion and frequency with dark urine.

## 2020-05-26 NOTE — Progress Notes (Signed)
telephone Note  I connected with Andre [name]  on 05/26/20 at  9:45 AM EST by telephone and verified that I am speaking with the correct person using two identifiers.  Location patient: home, [place] Location provider:work or home office Persons participating in the virtual visit: patient, provider pts wife   History per pts wife as pt has moderate dementia   I discussed the limitations of evaluation and management by telemedicine and the availability of in person appointments. The patient expressed understanding and agreed to proceed.   HPI: H/o falls and doing PT at home and PT yesterday reported pt was confused and rec get checked uti wife states he has baseline dementia moderate has neurology but has been more confused and agitated and sleeping more has appt today in [place] 2:15 pm with pulmonary to check cpap settings  Denies dysuria but urine is bright per wife    ROS: See pertinent positives and negatives per HPI.  Past Medical History:  Diagnosis Date  . Atherosclerosis of abdominal aorta ([place])   . Basal cell carcinoma 03/04/2008   Right nose supratip.   [name] CAD (coronary artery disease)   . Cervical spondylosis 10/01/2013  . Chronic diastolic CHF (congestive heart failure) ([place])    a. 07/2016 Echo: >55%; b. 10/2016 Echo: EF 55-60%, Gr1 DD, Ao sclerosis w/o stenosis, sev dil LA; c. 08/2017 Echo: EF 60-65%, no rwma, Gr2 DD, mild AS, sev dil LA/RA.  [name] Coronary artery disease    a. 1998 s/p mini-cabg @ [facility] - LIMA->LAD;  b. 07/2016 St Echo: Inadequate HR w/ HTN response;  c.  08/2016 MV: EF 67%, no ischemia; d. 10/2016 NSTEMI/Cath: RCA 95p (4.0x26 Onyx DES), LIMA->LAD nl; e. 09/2017 Cath: LM 40/30, LAD 100ost, RI 80, LCX nl, OM2/3 nl, RCA patent stent, 66m, LIMA->LAD nl-->Med Rx.  . DDD (degenerative disc disease), cervical   . DDD (degenerative disc disease), lumbar   . Depression   . Gait abnormality 07/31/2019  . GERD (gastroesophageal reflux disease)   . Hyperlipidemia   . Hypertension    . Hypothyroidism   . PAF (paroxysmal atrial fibrillation) ([facility])    a. s/p DCCV-->maintaining sinus on amiodarone;  b. CHA2DS2VASc = 5-->eliquis.  . Parkinson's disease ([place])    tremors  . Pleural effusion, right    a. 09/2017 s/p thoracentesis.  [name] PNA (pneumonia) 08/26/2017  . Pulmonary embolism ([place]) 2011  . Pulmonary fibrosis ([place])   . 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 Sleep apnea    wears CPAP  . Squamous cell carcinoma of skin 03/19/2015   Right lateral crown. KA-like pattern  . Thrombocytopenia ([place])     Past Surgical History:  Procedure Laterality Date  . BACK SURGERY  1960  . CARDIAC CATHETERIZATION    . CHOLECYSTECTOMY  2010  . COLONOSCOPY WITH PROPOFOL N/A 06/07/2018   Procedure: COLONOSCOPY WITH PROPOFOL;  Surgeon: Lollie Sails, MD;  Location[facility] ENDOSCOPY;  Service: Endoscopy;  Laterality: N/A;  . CORONARY ARTERY BYPASS GRAFT  01/07/1997  . CORONARY STENT INTERVENTION N/A 10/31/2016   Procedure: Coronary Stent Intervention;  Surgeon: Wellington Hampshire, MD;  Location: [place] CV LAB;  Service: Cardiovascular;  Laterality: N/A;  . ELECTROPHYSIOLOGIC STUDY N/A 07/14/2015   Procedure: CARDIOVERSION;  Surgeon: Yolonda Kida, MD;  Location: [facility] ORS;  Service: Cardiovascular;  Laterality: N/A;  . ELECTROPHYSIOLOGIC STUDY N/A 10/12/2015   Procedure: CARDIOVERSION;  Surgeon: Minna Merritts, MD;  Location: [facility] ORS;  Service: Cardiovascular;  Laterality: N/A;  . LEFT HEART  CATH AND CORONARY ANGIOGRAPHY N/A 10/31/2016   Procedure: Left Heart Cath and Coronary Angiography;  Surgeon: Wellington Hampshire, MD;  Location: [place] CV LAB;  Service: Cardiovascular;  Laterality: N/A;  . OTHER SURGICAL HISTORY  1998   Bypass  . RIGHT/LEFT HEART CATH AND CORONARY ANGIOGRAPHY N/A 09/18/2017   Procedure: RIGHT/LEFT HEART CATH AND CORONARY ANGIOGRAPHY;  Surgeon: Wellington Hampshire, MD;  Location: [place] CV LAB;  Service: Cardiovascular;  Laterality: N/A;      Current Outpatient Medications:  .  albuterol (VENTOLIN HFA) 108 (90 Base) MCG/ACT inhaler, Inhale 2 puffs into the lungs every 6 (six) hours as needed for wheezing or shortness of breath., Disp: 8 g, Rfl: 2 .  buPROPion (WELLBUTRIN XL) 300 MG 24 hr tablet, Take 1 tablet (300 mg total) by mouth daily., Disp: 90 tablet, Rfl: 1 .  carbidopa-levodopa (SINEMET IR) 25-250 MG tablet, TAKE TWO TABLETS 3 TIMES DAILY (Patient taking differently: Take 2 tablets by mouth 3 (three) times daily.), Disp: 540 tablet, Rfl: 3 .  carvedilol (COREG) 3.125 MG tablet, TAKE TWO TABLETS TWICE A DAY WITH MEALS (Patient taking differently: Take 6.25 mg by mouth 2 (two) times daily with a meal.), Disp: 120 tablet, Rfl: 3 .  ciprofloxacin (CIPRO) 500 MG tablet, Take 1 tablet (500 mg total) by mouth 2 (two) times daily. With food, Disp: 14 tablet, Rfl: 0 .  ELIQUIS 5 MG TABS tablet, TAKE ONE TABLET BY MOUTH TWICE DAILY (Patient taking differently: Take 5 mg by mouth 2 (two) times daily.), Disp: 60 tablet, Rfl: 5 .  entacapone (COMTAN) 200 MG tablet, Take 1 tablet (200 mg total) by mouth 3 (three) times daily., Disp: 270 tablet, Rfl: 3 .  esomeprazole (NEXIUM) 40 MG capsule, TAKE 1 CAPSULE BY MOUTH ONCE DAILY, Disp: 30 capsule, Rfl: 2 .  feeding supplement (ENSURE ENLIVE / ENSURE PLUS) LIQD, Take 237 mLs by mouth 3 (three) times daily between meals., Disp: 237 mL, Rfl: 12 .  fluticasone (FLONASE) 50 MCG/ACT nasal spray, USE 2 PUFFS IN EACH NOSTRIL DAILY, Disp: 16 g, Rfl: 3 .  furosemide (LASIX) 20 MG tablet, Take 1 tablet (20 mg total) by mouth daily., Disp: 30 tablet, Rfl:  .  HYDROcodone-acetaminophen (NORCO/VICODIN) 5-325 MG tablet, Take 1 tablet by mouth every 6 (six) hours as needed for moderate pain or severe pain., Disp: 5 tablet, Rfl: 0 .  hydrocortisone 2.5 % lotion, Apply topically daily. Apply to affected areas 4 times per week., Disp: 118 mL, Rfl: 3 .  ketoconazole (NIZORAL) 2 % cream, Apply 1 application  topically daily as needed for irritation., Disp: , Rfl:  .  ketoconazole (NIZORAL) 2 % shampoo, Apply 1 application topically 3 (three) times a week. Wash scalp, face and ears, Disp: 120 mL, Rfl: 3 .  ketorolac (ACULAR) 0.5 % ophthalmic solution, SMARTSIG:1 Drop(s) In Eye(s) Every 4-6 Hours PRN, Disp: , Rfl:  .  lactulose (CHRONULAC) 10 GM/15ML solution, TAKE 30 MLS BY MOUTH TWICE DAILY AS NEEDED FOR MODERATE CONSTIPATION, Disp: 236 mL, Rfl: 0 .  levothyroxine (SYNTHROID) 50 MCG tablet, TAKE ONE TABLET ON AN EMPTY STOMACH WITHA GLASS OF WATER AT LEAST 30 TO 60 MINUTES BEFORE BREAKFAST (Patient taking differently: Take 50 mcg by mouth daily before breakfast.), Disp: 90 tablet, Rfl: 1 .  losartan (COZAAR) 100 MG tablet, TAKE 1 TABLET BY MOUTH DAILY (Patient taking differently: Take 100 mg by mouth daily.), Disp: 90 tablet, Rfl: 1 .  mometasone (ELOCON) 0.1 % cream, , Disp: ,  Rfl:  .  mupirocin ointment (BACTROBAN) 2 %, Apply 1 application topically 3 (three) times daily. (Patient taking differently: Apply 1 application topically 3 (three) times daily as needed.), Disp: 30 g, Rfl: 2 .  potassium chloride SA (KLOR-CON) 10 MEQ tablet, Take 1 tablet (10 mEq total) by mouth daily., Disp: 90 tablet, Rfl: 1 .  PROAIR HFA 108 (90 Base) MCG/ACT inhaler, INHALE 2 PUFFS INTO THE LUNGS EVERY SIX HOURS AS NEEDED FOR WHEEZING OR SHORTNESS OF BREATH, Disp: 8.5 g, Rfl: 1 .  QUEtiapine (SEROQUEL) 25 MG tablet, Take 1 tablet (25 mg total) by mouth at bedtime., Disp: , Rfl:  .  rosuvastatin (CRESTOR) 10 MG tablet, TAKE 1 TABLET BY MOUTH DAILY, Disp: 90 tablet, Rfl: 0 .  sertraline (ZOLOFT) 50 MG tablet, Take 1 tablet (50 mg total) by mouth daily., Disp: 30 tablet, Rfl: 3 .  Tiotropium Bromide-Olodaterol (STIOLTO RESPIMAT) 2.5-2.5 MCG/ACT AERS, Inhale 2 puffs into the lungs daily., Disp: 1 g, Rfl: 5 .  Respiratory Therapy Supplies (FLUTTER) DEVI, 1 Device by Does not apply route daily. (Patient not taking: Reported on  05/26/2020), Disp: 1 each, Rfl: 0  EXAM: Exam n/a due to telephone and speaking with wife though pt talking in background but wife provides most of history   VITALS per patient if applicable:  GENERAL: alert, oriented, appears well and in no acute distress  PSYCH/NEURO: pleasant and cooperative, no obvious depression or anxiety, speech and thought processing grossly intact  ASSESSMENT AND PLAN:  Discussed the following assessment and plan:  Acute cystitis without hematuria - Plan: ciprofloxacin (CIPRO) 500 MG tablet bid x 1 week  Pending UA and culture  Wife to wait to pick this up until culture results 05/27/20    -we discussed possible serious and likely etiologies, options for evaluation and workup, limitations of telemedicine visit vs in person visit, treatment, treatment risks and precautions.  I discussed the assessment and treatment plan with the patient. The patient was provided an opportunity to ask questions and all were answered. The patient agreed with the plan and demonstrated an understanding of the instructions.    Time spent [place], MD

## 2020-05-27 NOTE — Telephone Encounter (Signed)
[name] called and wanted Dr. [name] to also know, Patient is having dementia issues a was up off and on all night and not making since. [name] would like him to have something for him to be able to sleep at night.

## 2020-05-27 NOTE — Telephone Encounter (Signed)
[name] called and wanted Dr. [name] to also know, Patient is having dementia issues a was up off and on all night and not making since. [name] would like him to have something for him to be able to sleep at night.  Andre [name],cma

## 2020-05-28 MED ORDER — QUETIAPINE FUMARATE 25 MG PO TABS: 25.0000 mg | ORAL_TABLET | Freq: Every day | ORAL | 1 refills | Status: DC

## 2020-05-28 NOTE — Assessment & Plan Note (Addendum)
-   Significant daytime sleepiness, unclear if due to OSA. Epworth score 22. Wife is concerned about dementia. He is following with neurology. He is 100% compliant with CPAP wearing on average 9 hours 13 mins. CPAP pressure 12cm h20; residual AHI is 2.9. Recommend he try changing mask d/t airleaks. Will check ABG and BMET.

## 2020-05-28 NOTE — Telephone Encounter (Signed)
Noted. He needs follow-up scheduled. I sent in the seroquel. He also needs to follow-up with his neurologist.

## 2020-05-28 NOTE — Telephone Encounter (Signed)
Patient was returning call about neurologist  She said to call her on the cell phone [phone]

## 2020-05-28 NOTE — Telephone Encounter (Signed)
I called and scheduled the patient for a follow up with the provider, I informed the patient's wife that he needed to see the neurologist also and she stated his neurologist Dr. Jannifer Franklin only sees patients every 2 weeks so its hard to schedule and the local neurologist cannot see the pateint until march.  Andre [name],cma

## 2020-05-28 NOTE — Assessment & Plan Note (Addendum)
-   Not on antifibrotics, poorly tolerated OFEV - No significant respiratory complaints today. mMRC dyspnea score 1

## 2020-05-29 ENCOUNTER — Telehealth: Payer: Self-pay | Admitting: Cardiovascular Disease

## 2020-05-29 ENCOUNTER — Telehealth: Payer: Self-pay | Admitting: Family Medicine

## 2020-05-29 NOTE — Telephone Encounter (Signed)
[name] called for urine lab results, she would like to know if she should give patient his medication for a UTI.

## 2020-05-29 NOTE — Telephone Encounter (Signed)
Spoke with the patients wife.  Pt wife wants to clarify the patients lasix dosage. The patients current prescriptions says that the patient should be taking Lasix 20 mg qd and Potassium 10 meq qd. This change was made when the patient was discharged from the hospital in Jan 2022.  Pt wife sts that the patient lasix prescription bottle sts 40 mg bid. She will update the patients pill bottle to lasix 20 mg qd and potassium 10 meq qd.  Patient is due for her 6 mo f/u with Dr. Fletcher Anon. Appt scheduled for 06/25/20.  Patient wife rqst an update be fwd to Dr. Fletcher Anon regarding the lasix.

## 2020-05-29 NOTE — Telephone Encounter (Signed)
If the availability of his current neurologist is not good they may need to consider seeing a different neurologist.  They could check with the neurology office he currently goes to to see if they have appointments with anyone else.

## 2020-05-29 NOTE — Telephone Encounter (Signed)
Ok thanks 

## 2020-05-29 NOTE — Telephone Encounter (Signed)
Spoke with the wife about the neurologist and informed her to make an appointment to follow up with the neurologist.  Andre [name]

## 2020-05-29 NOTE — Telephone Encounter (Signed)
Gave results to patient wife and she understood.  Sekou Zuckerman,cma

## 2020-05-29 NOTE — Telephone Encounter (Signed)
Patient wife made aware of Dr. Tyrell Antonio response.

## 2020-05-29 NOTE — Telephone Encounter (Signed)
Please call to discuss fluid medication . Patient is not sure if he should be taking 2 pill or 4.

## 2020-06-02 ENCOUNTER — Other Ambulatory Visit: Payer: Self-pay

## 2020-06-02 ENCOUNTER — Encounter: Payer: Self-pay | Admitting: Family Medicine

## 2020-06-02 ENCOUNTER — Ambulatory Visit (INDEPENDENT_AMBULATORY_CARE_PROVIDER_SITE_OTHER): Payer: HMO | Admitting: Family Medicine

## 2020-06-02 ENCOUNTER — Ambulatory Visit
Admission: RE | Admit: 2020-06-02 | Discharge: 2020-06-02 | Disposition: A | Discharge status: Home / Self Care | Payer: HMO | Source: Ambulatory Visit | Attending: Family Medicine | Admitting: Family Medicine

## 2020-06-02 VITALS — BP 90/60 | HR 59 | Temp 97.5°F | Ht 67.0 in | Wt 207.6 lb

## 2020-06-02 DIAGNOSIS — R413 Other amnesia: Secondary | ICD-10-CM | POA: Diagnosis not present

## 2020-06-02 DIAGNOSIS — G20 Parkinson's disease: Secondary | ICD-10-CM | POA: Diagnosis not present

## 2020-06-02 DIAGNOSIS — S0990XA Unspecified injury of head, initial encounter: Secondary | ICD-10-CM

## 2020-06-02 DIAGNOSIS — S0033XA Contusion of nose, initial encounter: Secondary | ICD-10-CM | POA: Diagnosis not present

## 2020-06-02 DIAGNOSIS — G4733 Obstructive sleep apnea (adult) (pediatric): Secondary | ICD-10-CM

## 2020-06-02 DIAGNOSIS — S0993XA Unspecified injury of face, initial encounter: Secondary | ICD-10-CM | POA: Diagnosis not present

## 2020-06-02 DIAGNOSIS — W19XXXA Unspecified fall, initial encounter: Secondary | ICD-10-CM | POA: Diagnosis not present

## 2020-06-02 DIAGNOSIS — I6381 Other cerebral infarction due to occlusion or stenosis of small artery: Secondary | ICD-10-CM | POA: Diagnosis not present

## 2020-06-02 LAB — VITAMIN D 25 HYDROXY (VIT D DEFICIENCY, FRACTURES): VITD: 40.26 ng/mL (ref 30.00–100.00)

## 2020-06-02 LAB — CBC
HCT: 45.8 % (ref 39.0–52.0)
Hemoglobin: 15.3 g/dL (ref 13.0–17.0)
MCHC: 33.4 g/dL (ref 30.0–36.0)
MCV: 89.5 fl (ref 78.0–100.0)
Platelets: 120 10*3/uL — ABNORMAL LOW (ref 150.0–400.0)
RBC: 5.11 Mil/uL (ref 4.22–5.81)
RDW: 16.2 % — ABNORMAL HIGH (ref 11.5–15.5)
WBC: 8.8 10*3/uL (ref 4.0–10.5)

## 2020-06-02 LAB — BASIC METABOLIC PANEL
BUN: 16 mg/dL (ref 6–23)
CO2: 28 mEq/L (ref 19–32)
Calcium: 8.6 mg/dL (ref 8.4–10.5)
Chloride: 104 mEq/L (ref 96–112)
Creatinine, Ser: 0.72 mg/dL (ref 0.40–1.50)
GFR: 85.05 mL/min (ref >60.00)
Glucose, Bld: 114 mg/dL — ABNORMAL HIGH (ref 70–99)
Potassium: 4.1 mEq/L (ref 3.5–5.1)
Sodium: 138 mEq/L (ref 135–145)

## 2020-06-02 LAB — TSH: TSH: 2.77 u[IU]/mL (ref 0.35–4.50)

## 2020-06-02 LAB — VITAMIN B12: Vitamin B-12: 423 pg/mL (ref 211–911)

## 2020-06-02 NOTE — Patient Instructions (Signed)
Nice to see you. We will get imaging completed. If you develop worsening headache, any new neurological symptoms, or worsening dizziness please go to the emergency department. Please see neurology as planned.  We will get labs today as well.

## 2020-06-02 NOTE — Assessment & Plan Note (Signed)
Patient with recurrent issues with falls.  I suspect his Parkinson's is playing a role.  He will see his neurologist as planned.  Plan for imaging as below.

## 2020-06-02 NOTE — Progress Notes (Signed)
[name], MD Phone: [phone]  Andre [name] is a 85 y.o. male who presents today for f/u.  The patient's wife provides much of the history.  Parkinson's/dementia/falls: They note continued issues with his balance and dizziness.  Has been working with physical therapy on this.  He has been sleeping a lot and did see pulmonology though they noted his sleep apnea was adequately treated.  He continues on Sinemet and entacapone.  He has been having intermittent hallucinations that he can tell far there and are not real.  These have been going on for at least a month.  They report the neurologist is aware of these.  He was waking up agitated the middle of the night though we started Seroquel recently and that has helped with his sleep and decreased his agitation.  He has fallen 3 times in the last several days.  He fell Saturday night and landed face down out of the bed.  He had no loss of consciousness.  He did have injury to his face with bruising and pain over his nasal bridge.  He notes continued pain in his nose.  He continues to have dizziness.  He is on Eliquis.  He does have headaches that have been an ongoing issue.  He does follow-up with neurology tomorrow.  Social History   Tobacco Use  Smoking Status Former Smoker  . Packs/day: 1.00  . Years: 10.00  . Pack years: 10.00  . Types: Cigarettes, Pipe, Cigars  . Quit date: 04/18/1972  . Years since quitting: 48.1  Smokeless Tobacco Former Systems developer  . Types: Chew  . Quit date: 04/18/1972    Current Outpatient Medications on File Prior to Visit  Medication Sig Dispense Refill  . albuterol (VENTOLIN HFA) 108 (90 Base) MCG/ACT inhaler Inhale 2 puffs into the lungs every 6 (six) hours as needed for wheezing or shortness of breath. 8 g 2  . buPROPion (WELLBUTRIN XL) 300 MG 24 hr tablet Take 1 tablet (300 mg total) by mouth daily. 90 tablet 1  . carbidopa-levodopa (SINEMET IR) 25-250 MG tablet TAKE TWO TABLETS 3 TIMES DAILY (Patient taking  differently: Take 2 tablets by mouth 3 (three) times daily.) 540 tablet 3  . carvedilol (COREG) 3.125 MG tablet TAKE TWO TABLETS TWICE A DAY WITH MEALS (Patient taking differently: Take 6.25 mg by mouth 2 (two) times daily with a meal.) 120 tablet 3  . ELIQUIS 5 MG TABS tablet TAKE ONE TABLET BY MOUTH TWICE DAILY (Patient taking differently: Take 5 mg by mouth 2 (two) times daily.) 60 tablet 5  . entacapone (COMTAN) 200 MG tablet Take 1 tablet (200 mg total) by mouth 3 (three) times daily. 270 tablet 3  . esomeprazole (NEXIUM) 40 MG capsule TAKE 1 CAPSULE BY MOUTH ONCE DAILY 30 capsule 2  . feeding supplement (ENSURE ENLIVE / ENSURE PLUS) LIQD Take 237 mLs by mouth 3 (three) times daily between meals. 237 mL 12  . fluticasone (FLONASE) 50 MCG/ACT nasal spray USE 2 PUFFS IN EACH NOSTRIL DAILY 16 g 3  . furosemide (LASIX) 20 MG tablet Take 1 tablet (20 mg total) by mouth daily. 30 tablet   . hydrocortisone 2.5 % lotion Apply topically daily. Apply to affected areas 4 times per week. 118 mL 3  . ketoconazole (NIZORAL) 2 % cream Apply 1 application topically daily as needed for irritation.    [name] ketoconazole (NIZORAL) 2 % shampoo Apply 1 application topically 3 (three) times a week. Wash scalp, face and ears 120  mL 3  . ketorolac (ACULAR) 0.5 % ophthalmic solution SMARTSIG:1 Drop(s) In Eye(s) Every 4-6 Hours PRN    . lactulose (CHRONULAC) 10 GM/15ML solution TAKE 30 MLS BY MOUTH TWICE DAILY AS NEEDED FOR MODERATE CONSTIPATION 236 mL 0  . levothyroxine (SYNTHROID) 50 MCG tablet TAKE ONE TABLET ON AN EMPTY STOMACH WITHA GLASS OF WATER AT LEAST 30 TO 60 MINUTES BEFORE BREAKFAST (Patient taking differently: Take 50 mcg by mouth daily before breakfast.) 90 tablet 1  . losartan (COZAAR) 100 MG tablet TAKE 1 TABLET BY MOUTH DAILY (Patient taking differently: Take 100 mg by mouth daily.) 90 tablet 1  . mometasone (ELOCON) 0.1 % cream     . mupirocin ointment (BACTROBAN) 2 % Apply 1 application topically 3 (three)  times daily. (Patient taking differently: Apply 1 application topically 3 (three) times daily as needed.) 30 g 2  . potassium chloride SA (KLOR-CON) 10 MEQ tablet Take 1 tablet (10 mEq total) by mouth daily. 90 tablet 1  . PROAIR HFA 108 (90 Base) MCG/ACT inhaler INHALE 2 PUFFS INTO THE LUNGS EVERY SIX HOURS AS NEEDED FOR WHEEZING OR SHORTNESS OF BREATH 8.5 g 1  . QUEtiapine (SEROQUEL) 25 MG tablet Take 1 tablet (25 mg total) by mouth at bedtime. 30 tablet 1  . Respiratory Therapy Supplies (FLUTTER) DEVI 1 Device by Does not apply route daily. 1 each 0  . rosuvastatin (CRESTOR) 10 MG tablet TAKE 1 TABLET BY MOUTH DAILY 90 tablet 0  . sertraline (ZOLOFT) 50 MG tablet Take 1 tablet (50 mg total) by mouth daily. 30 tablet 3  . Tiotropium Bromide-Olodaterol (STIOLTO RESPIMAT) 2.5-2.5 MCG/ACT AERS Inhale 2 puffs into the lungs daily. 1 g 5  . ciprofloxacin (CIPRO) 500 MG tablet Take 1 tablet (500 mg total) by mouth 2 (two) times daily. With food (Patient not taking: Reported on 06/02/2020) 14 tablet 0   No current facility-administered medications on file prior to visit.     ROS see history of present illness  Objective  Physical Exam Vitals:   06/02/20 1103  BP: 90/60  Pulse: (!) 59  Temp: (!) 97.5 F (36.4 C)  SpO2: 98%    BP Readings from Last 3 Encounters:  06/02/20 90/60  05/26/20 120/74  05/26/20 128/78   Wt Readings from Last 3 Encounters:  06/02/20 207 lb 9.6 oz (94.2 kg)  05/26/20 202 lb 9.6 oz (91.9 kg)  05/26/20 196 lb (88.9 kg)    Physical Exam Constitutional:      General: He is not in acute distress.    Appearance: He is not diaphoretic.  HENT:     Head:     Comments: Bruising over the bridge of his nose, there is tenderness of the no palpable bony defect, no tenderness around the orbits or bony defect around the orbits Eyes:     Extraocular Movements: Extraocular movements intact.     Pupils: Pupils are equal, round, and reactive to light.  Cardiovascular:      Rate and Rhythm: Normal rate and regular rhythm.     Heart sounds: Normal heart sounds.  Pulmonary:     Effort: Pulmonary effort is normal.     Breath sounds: Normal breath sounds.  Musculoskeletal:        General: No edema.  Skin:    General: Skin is warm and dry.  Neurological:     Mental Status: He is alert.     Comments: Hearing slightly decreased left ear, right ear hearing intact to finger rub,  facial sensation V1 through V3 intact to light touch bilaterally, shoulder shrug intact, opens and closes eyes adequately, 5/5 strength in bilateral biceps, triceps, grip, quads, hamstrings, plantar and dorsiflexion, sensation to light touch intact in bilateral UE and LE      Assessment/Plan: Please see individual problem list.  Problem List Items Addressed This Visit    Fall    Patient with recurrent issues with falls.  I suspect his Parkinson's is playing a role.  He will see his neurologist as planned.  Plan for imaging as below.      Relevant Orders   CBC   Basic Metabolic Panel (BMET)   M62   Vitamin D (25 hydroxy)   TSH   Head trauma - Primary    Related to recent fall out of bed.  Given injury to face and head and that he is on Eliquis and ongoing issues with balance and dizziness I believe imaging is warranted.  This was discussed with the patient's family.  We will get CT head and CT maxillofacial ordered to evaluate for intracranial bleed as well as facial trauma.  Advised to seek medical attention for any worsening symptoms, increasing headache, or new neurological symptoms.      Relevant Orders   CT Head Wo Contrast   OSA (obstructive sleep apnea)    Adequately controlled based on recent review of pulmonology note.  He will continue his CPAP.      Parkinson's disease ([place]) (Chronic)    I suspect his Parkinson's may be contributing to his dizziness and balance issues.  He also seems to have some level of dementia though hearing difficulty may be contributing to that  as well.  He is seeing neurology tomorrow and I encouraged him to keep that appointment.  I encouraged him to use his hearing aids.  We will check basic lab work to determine if anything else is going on to cause his symptoms.      Relevant Orders   B12   Vitamin D (25 hydroxy)    Other Visit Diagnoses    Facial injury, initial encounter       Relevant Orders   CT Maxillofacial WO CM   Memory difficulties       Relevant Orders   CBC   Basic Metabolic Panel (BMET)   H47   Vitamin D (25 hydroxy)   TSH      This visit occurred during the SARS-CoV-2 public health emergency.  Safety protocols were in place, including screening questions prior to the visit, additional usage of staff PPE, and extensive cleaning of exam room while observing appropriate contact time as indicated for disinfecting solutions.    [name], MD [place]

## 2020-06-02 NOTE — Assessment & Plan Note (Signed)
Related to recent fall out of bed.  Given injury to face and head and that he is on Eliquis and ongoing issues with balance and dizziness I believe imaging is warranted.  This was discussed with the patient's family.  We will get CT head and CT maxillofacial ordered to evaluate for intracranial bleed as well as facial trauma.  Advised to seek medical attention for any worsening symptoms, increasing headache, or new neurological symptoms.

## 2020-06-02 NOTE — Assessment & Plan Note (Addendum)
I suspect his Parkinson's may be contributing to his dizziness and balance issues.  He also seems to have some level of dementia though hearing difficulty may be contributing to that as well.  He is seeing neurology tomorrow and I encouraged him to keep that appointment.  I encouraged him to use his hearing aids.  We will check basic lab work to determine if anything else is going on to cause his symptoms.

## 2020-06-02 NOTE — Assessment & Plan Note (Signed)
Adequately controlled based on recent review of pulmonology note.  He will continue his CPAP.

## 2020-06-03 ENCOUNTER — Other Ambulatory Visit: Payer: Self-pay | Admitting: Cardiovascular Disease

## 2020-06-03 ENCOUNTER — Encounter: Payer: Self-pay | Admitting: Neurology

## 2020-06-03 ENCOUNTER — Ambulatory Visit (INDEPENDENT_AMBULATORY_CARE_PROVIDER_SITE_OTHER): Payer: HMO | Admitting: Neurology

## 2020-06-03 VITALS — BP 137/84 | HR 85 | Ht 66.0 in | Wt 207.0 lb

## 2020-06-03 DIAGNOSIS — F0280 Dementia in other diseases classified elsewhere without behavioral disturbance: Secondary | ICD-10-CM | POA: Diagnosis not present

## 2020-06-03 DIAGNOSIS — R269 Unspecified abnormalities of gait and mobility: Secondary | ICD-10-CM | POA: Diagnosis not present

## 2020-06-03 DIAGNOSIS — G3183 Dementia with Lewy bodies: Secondary | ICD-10-CM | POA: Diagnosis not present

## 2020-06-03 DIAGNOSIS — G20C Parkinsonism, unspecified: Secondary | ICD-10-CM

## 2020-06-03 DIAGNOSIS — G20 Parkinson's disease: Secondary | ICD-10-CM | POA: Diagnosis not present

## 2020-06-03 HISTORY — DX: Dementia in other diseases classified elsewhere without behavioral disturbance: F02.80

## 2020-06-03 HISTORY — DX: Parkinsonism, unspecified: G20.C

## 2020-06-03 MED ORDER — CARBIDOPA-LEVODOPA 25-100 MG PO TABS: 1.0000 | ORAL_TABLET | Freq: Three times a day (TID) | ORAL | 1 refills | Status: DC

## 2020-06-03 MED ORDER — CARBIDOPA-LEVODOPA 25-250 MG PO TABS
1.0000 | ORAL_TABLET | Freq: Three times a day (TID) | ORAL | 3 refills | Status: DC
Start: 2020-06-03 — End: 2020-10-22

## 2020-06-03 NOTE — Progress Notes (Signed)
Reason for visit: Parkinson's disease, dementia, gait disturbance  Andre [name] is an 85 y.o. male  History of present illness:  [name] is a 85 year old right-handed white male with a history of Parkinson's disease.  The patient has developed a memory disorder and he has had some hallucinations with this.  He has sleep apnea on CPAP, he has excessive daytime drowsiness but he has had a recent check with his pulmonologist and they feel that his settings are adequate.  The patient is on Sinemet taking the 25/250 mg tablets, 2 tablets 3 times daily with Comtan.  The patient is not having any freezing episodes throughout the day but he is having hallucinations and confusion.  He is getting physical therapy in the home environment, they have a cane and a walker, but he does not consistently use either one.  He continues to fall on a regular basis, the last fall occurred 2 to 3 days ago associated with facial trauma.  A repeat CT scan of the brain was unremarkable.  The patient is eating and drinking fairly well, he is not choking.  Past Medical History:  Diagnosis Date  . Atherosclerosis of abdominal aorta ([place])   . Basal cell carcinoma 03/04/2008   Right nose supratip.   [name] CAD (coronary artery disease)   . Cervical spondylosis 10/01/2013  . Chronic diastolic CHF (congestive heart failure) ([place])    a. 07/2016 Echo: >55%; b. 10/2016 Echo: EF 55-60%, Gr1 DD, Ao sclerosis w/o stenosis, sev dil LA; c. 08/2017 Echo: EF 60-65%, no rwma, Gr2 DD, mild AS, sev dil LA/RA.  [name] Coronary artery disease    a. 1998 s/p mini-cabg @ [facility] - LIMA->LAD;  b. 07/2016 St Echo: Inadequate HR w/ HTN response;  c.  08/2016 MV: EF 67%, no ischemia; d. 10/2016 NSTEMI/Cath: RCA 95p (4.0x26 Onyx DES), LIMA->LAD nl; e. 09/2017 Cath: LM 40/30, LAD 100ost, RI 80, LCX nl, OM2/3 nl, RCA patent stent, 28m, LIMA->LAD nl-->Med Rx.  . DDD (degenerative disc disease), cervical   . DDD (degenerative disc disease), lumbar   . Depression    . Gait abnormality 07/31/2019  . GERD (gastroesophageal reflux disease)   . Hyperlipidemia   . Hypertension   . Hypothyroidism   . PAF (paroxysmal atrial fibrillation) ([facility])    a. s/p DCCV-->maintaining sinus on amiodarone;  b. CHA2DS2VASc = 5-->eliquis.  . Parkinson's disease ([place])    tremors  . Pleural effusion, right    a. 09/2017 s/p thoracentesis.  [name] PNA (pneumonia) 08/26/2017  . Pulmonary embolism ([place]) 2011  . Pulmonary fibrosis ([place])   . 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 Sleep apnea    wears CPAP  . Squamous cell carcinoma of skin 03/19/2015   Right lateral crown. KA-like pattern  . Thrombocytopenia ([place])     Past Surgical History:  Procedure Laterality Date  . BACK SURGERY  1960  . CARDIAC CATHETERIZATION    . CHOLECYSTECTOMY  2010  . COLONOSCOPY WITH PROPOFOL N/A 06/07/2018   Procedure: COLONOSCOPY WITH PROPOFOL;  Surgeon: Lollie Sails, MD;  Location: [facility] ENDOSCOPY;  Service: Endoscopy;  Laterality: N/A;  . CORONARY ARTERY BYPASS GRAFT  01/07/1997  . CORONARY STENT INTERVENTION N/A 10/31/2016   Procedure: Coronary Stent Intervention;  Surgeon: Wellington Hampshire, MD;  Location: [facility] CV LAB;  Service: Cardiovascular;  Laterality: N/A;  . ELECTROPHYSIOLOGIC STUDY N/A 07/14/2015   Procedure: CARDIOVERSION;  Surgeon: Yolonda Kida, MD;  Location: [facility] ORS;  Service: Cardiovascular;  Laterality: N/A;  .  ELECTROPHYSIOLOGIC STUDY N/A 10/12/2015   Procedure: CARDIOVERSION;  Surgeon: Minna Merritts, MD;  Location: [facility] ORS;  Service: Cardiovascular;  Laterality: N/A;  . LEFT HEART CATH AND CORONARY ANGIOGRAPHY N/A 10/31/2016   Procedure: Left Heart Cath and Coronary Angiography;  Surgeon: Wellington Hampshire, MD;  Location: [facility];  Service: Cardiovascular;  Laterality: N/A;  . OTHER SURGICAL HISTORY  1998   Bypass  . RIGHT/LEFT HEART CATH AND CORONARY ANGIOGRAPHY N/A 09/18/2017   Procedure: RIGHT/LEFT HEART CATH AND CORONARY ANGIOGRAPHY;   Surgeon: Wellington Hampshire, MD;  Location: [facility] LAB;  Service: Cardiovascular;  Laterality: N/A;    Family History  Problem Relation Age of Onset  . Alcohol abuse Father     Social history:  reports that he quit smoking about 48 years ago. His smoking use included cigarettes, pipe, and cigars. He has a 10.00 pack-year smoking history. He quit smokeless tobacco use about 48 years ago.  His smokeless tobacco use included chew. He reports current alcohol use of about 4.0 standard drinks of alcohol per week. He reports that he does not use drugs.    Allergies  Allergen Reactions  . Pravastatin Other (See Comments)  . Prednisone Other (See Comments)    Pt states that med makes him hyper Pt states that med makes him hyper    Medications:  Prior to Admission medications   Medication Sig Start Date End Date Taking? Authorizing Provider  albuterol (VENTOLIN HFA) 108 (90 Base) MCG/ACT inhaler Inhale 2 puffs into the lungs every 6 (six) hours as needed for wheezing or shortness of breath. 05/07/20  Yes Danford, Suann Larry, MD  buPROPion (WELLBUTRIN XL) 300 MG 24 hr tablet Take 1 tablet (300 mg total) by mouth daily. 05/19/20  Yes [name], MD  carbidopa-levodopa (SINEMET IR) 25-250 MG tablet TAKE TWO TABLETS 3 TIMES DAILY Patient taking differently: Take 2 tablets by mouth 3 (three) times daily. 12/24/19  Yes Kathrynn Ducking, MD  carvedilol (COREG) 3.125 MG tablet TAKE TWO TABLETS TWICE A DAY WITH MEALS Patient taking differently: Take 6.25 mg by mouth 2 (two) times daily with a meal. 12/20/19  Yes Arida, Mertie Clause, MD  ELIQUIS 5 MG TABS tablet TAKE ONE TABLET BY MOUTH TWICE DAILY Patient taking differently: Take 5 mg by mouth 2 (two) times daily. 02/10/20  Yes Wellington Hampshire, MD  entacapone (COMTAN) 200 MG tablet Take 1 tablet (200 mg total) by mouth 3 (three) times daily. 06/27/19  Yes Kathrynn Ducking, MD  esomeprazole (NEXIUM) 40 MG capsule TAKE 1 CAPSULE BY MOUTH ONCE  DAILY 05/18/20  Yes [name], MD  feeding supplement (ENSURE ENLIVE / ENSURE PLUS) LIQD Take 237 mLs by mouth 3 (three) times daily between meals. 05/07/20  Yes Danford, Suann Larry, MD  fluticasone (FLONASE) 50 MCG/ACT nasal spray USE 2 PUFFS IN EACH NOSTRIL DAILY 09/05/19  Yes [name], MD  furosemide (LASIX) 20 MG tablet Take 1 tablet (20 mg total) by mouth daily. 05/07/20  Yes Danford, Suann Larry, MD  hydrocortisone 2.5 % lotion Apply topically daily. Apply to affected areas 4 times per week. 02/05/20  Yes Ralene Bathe, MD  ketoconazole (NIZORAL) 2 % cream Apply 1 application topically daily as needed for irritation. 01/01/18  Yes [provider]  ketoconazole (NIZORAL) 2 % shampoo Apply 1 application topically 3 (three) times a week. Wash scalp, face and ears 02/05/20  Yes Ralene Bathe, MD  ketorolac (ACULAR) 0.5 %  ophthalmic solution SMARTSIG:1 Drop(s) In Eye(s) Every 4-6 Hours PRN 06/19/19  Yes [provider]  lactulose (CHRONULAC) 10 GM/15ML solution TAKE 30 MLS BY MOUTH TWICE DAILY AS NEEDED FOR MODERATE CONSTIPATION 05/13/20  Yes Crecencio Mc, MD  levothyroxine (SYNTHROID) 50 MCG tablet TAKE ONE TABLET ON AN EMPTY STOMACH WITHA GLASS OF WATER AT LEAST 30 TO [place] BREAKFAST Patient taking differently: Take 50 mcg by mouth daily before breakfast. 02/04/20  Yes [name], MD  losartan (COZAAR) 100 MG tablet TAKE 1 TABLET BY MOUTH DAILY Patient taking differently: Take 100 mg by mouth daily. 11/05/19  Yes [name], MD  mometasone (ELOCON) 0.1 % cream  01/01/18  Yes [provider]  mupirocin ointment (BACTROBAN) 2 % Apply 1 application topically 3 (three) times daily. Patient taking differently: Apply 1 application topically 3 (three) times daily as needed. 10/30/19  Yes McLean-Scocuzza, Nino Glow, MD  potassium chloride SA (KLOR-CON) 10 MEQ tablet Take 1 tablet (10 mEq total) by mouth daily. 12/12/19  Yes Wellington Hampshire, MD  PROAIR HFA 108 [phone] Base) MCG/ACT inhaler INHALE 2 PUFFS INTO THE LUNGS EVERY SIX HOURS AS NEEDED FOR WHEEZING OR SHORTNESS OF BREATH 10/08/19  Yes Martyn Ehrich, NP  QUEtiapine (SEROQUEL) 25 MG tablet Take 1 tablet (25 mg total) by mouth at bedtime. 05/28/20  Yes [name], MD  Respiratory Therapy Supplies (FLUTTER) DEVI 1 Device by Does not apply route daily. 05/29/19  Yes Martyn Ehrich, NP  rosuvastatin (CRESTOR) 10 MG tablet TAKE 1 TABLET BY MOUTH DAILY 05/18/20  Yes Wellington Hampshire, MD  sertraline (ZOLOFT) 50 MG tablet Take 1 tablet (50 mg total) by mouth daily. 03/03/20  Yes [name], MD  Tiotropium Bromide-Olodaterol (STIOLTO RESPIMAT) 2.5-2.5 MCG/ACT AERS Inhale 2 puffs into the lungs daily. 10/08/19  Yes Mannam, Praveen, MD  ciprofloxacin (CIPRO) 500 MG tablet Take 1 tablet (500 mg total) by mouth 2 (two) times daily. With food 05/26/20   McLean-Scocuzza, Nino Glow, MD    ROS:  Out of a complete 14 system review of symptoms, the patient complains only of the following symptoms, and all other reviewed systems are negative.  Confusion Hallucinations Walking difficulty  Blood pressure 137/84, pulse 85, height 5\' 6"  (1.676 m), weight 207 lb (93.9 kg).   Blood pressure, right arm, standing is 116/64.  Physical Exam  General: The patient is sleepy, but cooperative at the time of the examination.  The patient is moderately to markedly obese.  Skin: No significant peripheral edema is noted.   Neurologic Exam  Mental status: The patient is alert and oriented x 3 at the time of the examination. The patient has apparent normal recent and remote memory, with an apparently normal attention span and concentration ability.   Cranial nerves: Facial symmetry is present. Speech is normal, no aphasia or dysarthria is noted. Extraocular movements are full. Visual fields are full.  Motor: The patient has good strength in all 4 extremities.  Sensory  examination: Soft touch sensation is symmetric on the face, arms, and legs.  Coordination: The patient has good finger-nose-finger and heel-to-shin bilaterally.  Gait and station: The patient has a slightly wide-based gait, the patient can walk independently, some slight instability with turns.  He is able to stand up on his own from the chair.  Tandem gait was not attempted.  Romberg is negative.  Reflexes: Deep tendon reflexes are symmetric.   CT head 05/02/20:  IMPRESSION: 1.  No evidence of acute intracranial abnormality. 2. Moderate chronic small vessel ischemic disease. 3. No acute maxillofacial fracture identified within limitations of motion artifact.  * CT scan images were reviewed online. I agree with the written report.    Assessment/Plan:  1.  Parkinson's disease  2.  Gait instability, multiple falls  3.  Chronic anticoagulation  4.  Dementia  5.  Hallucinations  The patient will be reduced on the Sinemet dosing to hopefully help the cognitive status.  He will go to the 25/250 mg Sinemet tablets, taking 1 tablet 3 times daily.  He will begin taking Sinemet 25/100 mg tablets, taking 1 tablet 3 times daily.  He will follow up here in April for his next scheduled revisit.  He will continue physical therapy.  I have indicated that he is to use the walker at all times with trying to ambulate.  He is on Seroquel 25 mg at night, if the hallucinations continue on the lower dose of the Sinemet, we can increase this medication.  Jill Alexanders MD 06/03/2020 9:14 AM  [facility] [address] [place], [place] [address]  Phone [phone] Fax [phone]

## 2020-06-03 NOTE — Patient Instructions (Signed)
Reduce the Sinemet (carbidopa) 25/250 to one tablet three times a day.  Add Sinemet 25/100 mg tablet taking one tablet three times a day.

## 2020-06-04 ENCOUNTER — Telehealth: Payer: Self-pay | Admitting: Neurology

## 2020-06-04 NOTE — Telephone Encounter (Signed)
Their wife is wondering if they could substitute a half of a 25/250 Sinemet tablet for the 25/100 mg tablets until the use of the 25/250 tablets.  This is fine to do, only 25 mg difference in dose.

## 2020-06-04 NOTE — Telephone Encounter (Signed)
Pt's wife is needing to speak to the RN to discuss the pt's dosage for his carbidopa-levodopa  Please advise.

## 2020-06-09 DIAGNOSIS — J441 Chronic obstructive pulmonary disease with (acute) exacerbation: Secondary | ICD-10-CM | POA: Diagnosis not present

## 2020-06-09 DIAGNOSIS — J449 Chronic obstructive pulmonary disease, unspecified: Secondary | ICD-10-CM | POA: Diagnosis not present

## 2020-06-09 DIAGNOSIS — G4733 Obstructive sleep apnea (adult) (pediatric): Secondary | ICD-10-CM | POA: Diagnosis not present

## 2020-06-09 DIAGNOSIS — J45909 Unspecified asthma, uncomplicated: Secondary | ICD-10-CM | POA: Diagnosis not present

## 2020-06-10 ENCOUNTER — Inpatient Hospital Stay (HOSPITAL_BASED_OUTPATIENT_CLINIC_OR_DEPARTMENT_OTHER): Payer: HMO | Admitting: Internal Medicine

## 2020-06-10 ENCOUNTER — Telehealth: Payer: Self-pay | Admitting: Family Medicine

## 2020-06-10 ENCOUNTER — Telehealth: Payer: Self-pay | Admitting: Primary Care

## 2020-06-10 ENCOUNTER — Encounter: Payer: Self-pay | Admitting: Internal Medicine

## 2020-06-10 ENCOUNTER — Other Ambulatory Visit: Payer: Self-pay

## 2020-06-10 ENCOUNTER — Inpatient Hospital Stay: Payer: HMO | Attending: Internal Medicine

## 2020-06-10 VITALS — BP 116/86 | HR 74 | Temp 97.2°F | Resp 16 | Ht 66.0 in | Wt 205.0 lb

## 2020-06-10 DIAGNOSIS — J441 Chronic obstructive pulmonary disease with (acute) exacerbation: Secondary | ICD-10-CM | POA: Diagnosis not present

## 2020-06-10 DIAGNOSIS — Z7901 Long term (current) use of anticoagulants: Secondary | ICD-10-CM | POA: Diagnosis not present

## 2020-06-10 DIAGNOSIS — J449 Chronic obstructive pulmonary disease, unspecified: Secondary | ICD-10-CM | POA: Diagnosis not present

## 2020-06-10 DIAGNOSIS — D696 Thrombocytopenia, unspecified: Secondary | ICD-10-CM

## 2020-06-10 DIAGNOSIS — I480 Paroxysmal atrial fibrillation: Secondary | ICD-10-CM | POA: Diagnosis not present

## 2020-06-10 DIAGNOSIS — Z79899 Other long term (current) drug therapy: Secondary | ICD-10-CM | POA: Diagnosis not present

## 2020-06-10 DIAGNOSIS — D751 Secondary polycythemia: Secondary | ICD-10-CM | POA: Diagnosis not present

## 2020-06-10 DIAGNOSIS — G4733 Obstructive sleep apnea (adult) (pediatric): Secondary | ICD-10-CM | POA: Diagnosis not present

## 2020-06-10 DIAGNOSIS — I2510 Atherosclerotic heart disease of native coronary artery without angina pectoris: Secondary | ICD-10-CM | POA: Diagnosis not present

## 2020-06-10 DIAGNOSIS — J45909 Unspecified asthma, uncomplicated: Secondary | ICD-10-CM | POA: Diagnosis not present

## 2020-06-10 LAB — CBC WITH DIFFERENTIAL/PLATELET
Abs Immature Granulocytes: 0.02 10*3/uL (ref 0.00–0.07)
Basophils Absolute: 0 10*3/uL (ref 0.0–0.1)
Basophils Relative: 0 %
Eosinophils Absolute: 0.1 10*3/uL (ref 0.0–0.5)
Eosinophils Relative: 1 %
HCT: 49.6 % (ref 39.0–52.0)
Hemoglobin: 15.9 g/dL (ref 13.0–17.0)
Immature Granulocytes: 0 %
Lymphocytes Relative: 17 %
Lymphs Abs: 1.4 10*3/uL (ref 0.7–4.0)
MCH: 29.4 pg (ref 26.0–34.0)
MCHC: 32.1 g/dL (ref 30.0–36.0)
MCV: 91.7 fL (ref 80.0–100.0)
Monocytes Absolute: 0.6 10*3/uL (ref 0.1–1.0)
Monocytes Relative: 8 %
Neutro Abs: 5.9 10*3/uL (ref 1.7–7.7)
Neutrophils Relative %: 74 %
Platelets: 124 10*3/uL — ABNORMAL LOW (ref 150–400)
RBC: 5.41 MIL/uL (ref 4.22–5.81)
RDW: 16 % — ABNORMAL HIGH (ref 11.5–15.5)
WBC: 8 10*3/uL (ref 4.0–10.5)
nRBC: 0 % (ref 0.0–0.2)

## 2020-06-10 LAB — COMPREHENSIVE METABOLIC PANEL
ALT: 13 U/L (ref 0–44)
AST: 16 U/L (ref 15–41)
Albumin: 3.5 g/dL (ref 3.5–5.0)
Alkaline Phosphatase: 86 U/L (ref 38–126)
Anion gap: 10 (ref 5–15)
BUN: 17 mg/dL (ref 8–23)
CO2: 25 mmol/L (ref 22–32)
Calcium: 8.5 mg/dL — ABNORMAL LOW (ref 8.9–10.3)
Chloride: 103 mmol/L (ref 98–111)
Creatinine, Ser: 0.89 mg/dL (ref 0.61–1.24)
GFR, Estimated: >60 mL/min (ref >60)
Glucose, Bld: 161 mg/dL — ABNORMAL HIGH (ref 70–99)
Potassium: 3.7 mmol/L (ref 3.5–5.1)
Sodium: 138 mmol/L (ref 135–145)
Total Bilirubin: 0.7 mg/dL (ref 0.3–1.2)
Total Protein: 6.5 g/dL (ref 6.5–8.1)

## 2020-06-10 NOTE — Progress Notes (Signed)
Having more falls recently. States he has been having dizziness.

## 2020-06-10 NOTE — Telephone Encounter (Signed)
Patient called in for lab results  ?

## 2020-06-10 NOTE — Assessment & Plan Note (Signed)
#   Mild thrombocytopenia platelets-120s. question secondary to alcohol versus liver disease.vs ITP. STABLE  Monitor for now.  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 STABLE.; HCT 49.6-  HOLD phlebotomy.   # CAD/-stenting [Dr.Arida] A. Fib- on Eliquis per cardiology/ see above. STABLE.   #Interstitial lung disease- on Nintedanib-STABLE.   # Hx of Falls-parkinsons/hallucinations [Dr.Willis; [facility]]  # I spoke to his wife; the above plan of care.  # DISPOSITION:  # NO Phlebotomy today # Follow up in  6 months-MD labs- cbc/cmp- Dr.B  Cc; Dr.Sonnenberg

## 2020-06-10 NOTE — Progress Notes (Signed)
[facility] OFFICE PROGRESS NOTE  Patient Care Team: [name], MD as PCP - General (Family Medicine) Wellington Hampshire, MD as PCP - Cardiology (Cardiology)   SUMMARY OF ONCOLOGIC HISTORY: # Mild Thrombocytopenia- 120-130s. ? Alcohol/fatty liver/? 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 [facility]]; chronic CHF; Pulmonary fiborsis- on TKI.  INTERVAL HISTORY:  85 year old male patient with above history of secondary erythrocytosis from prior testosterone therapy/CPAP; intermittent thrombocytopenia and history of A. fib on Elquis here for follow-up.   Patient also increasing falls/hallucinations.  His Parkinson medications have been adjusted.  He continues to follow-up with pulmonary for his interstitial lung disease.  He denies any new worsening shortness of breath or cough.   Review of Systems  Constitutional: Negative for chills, diaphoresis, fever, malaise/fatigue and weight loss.  HENT: Negative for nosebleeds and sore throat.   Eyes: Negative for double vision.  Respiratory: Negative for cough, hemoptysis, sputum production, shortness of breath and wheezing.   Cardiovascular: Negative for chest pain, palpitations, orthopnea and leg swelling.  Gastrointestinal: Negative for abdominal pain, blood in stool, constipation, diarrhea, heartburn, melena, nausea and vomiting.  Genitourinary: Negative for dysuria, frequency and urgency.  Musculoskeletal: Positive for back pain and joint pain.  Skin: Negative.  Negative for itching and rash.  Neurological: Negative for dizziness, tingling, focal weakness, weakness and headaches.  Endo/Heme/Allergies: Does not bruise/bleed easily.   Psychiatric/Behavioral: Negative for depression. The patient is not nervous/anxious and does not have insomnia.      PAST MEDICAL HISTORY :  Past Medical History:  Diagnosis Date  . Atherosclerosis of abdominal aorta ([place])   . Basal cell carcinoma 03/04/2008   Right nose supratip.   [name] CAD (coronary artery disease)   . Cervical spondylosis 10/01/2013  . Chronic diastolic CHF (congestive heart failure) ([place])    a. 07/2016 Echo: >55%; b. 10/2016 Echo: EF 55-60%, Gr1 DD, Ao sclerosis w/o stenosis, sev dil LA; c. 08/2017 Echo: EF 60-65%, no rwma, Gr2 DD, mild AS, sev dil LA/RA.  [name] Coronary artery disease    a. 1998 s/p mini-cabg @ [facility] - LIMA->LAD;  b. 07/2016 St Echo: Inadequate HR w/ HTN response;  c.  08/2016 MV: EF 67%, no ischemia; d. 10/2016 NSTEMI/Cath: RCA 95p (4.0x26 Onyx DES), LIMA->LAD nl; e. 09/2017 Cath: LM 40/30, LAD 100ost, RI 80, LCX nl, OM2/3 nl, RCA patent stent, 34m, LIMA->LAD nl-->Med Rx.  . DDD (degenerative disc disease), cervical   . DDD (degenerative disc disease), lumbar   . 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 Depression   . Gait abnormality 07/31/2019  . GERD (gastroesophageal reflux disease)   . Hyperlipidemia   . Hypertension   . Hypothyroidism   . PAF (paroxysmal atrial fibrillation) ([facility])    a. s/p DCCV-->maintaining sinus on amiodarone;  b. CHA2DS2VASc = 5-->eliquis.  . Parkinson's disease ([place])    tremors  . Pleural effusion, right    a. 09/2017 s/p thoracentesis.  [name] PNA (pneumonia) 08/26/2017  . Pulmonary embolism ([place]) 2011  . Pulmonary fibrosis ([place])   . 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 Sleep apnea    wears CPAP  . Squamous cell carcinoma of skin 03/19/2015   Right lateral crown. KA-like pattern  . Thrombocytopenia ([place])     PAST SURGICAL HISTORY :  Past Surgical History:  Procedure Laterality Date  . BACK SURGERY  1960  . CARDIAC CATHETERIZATION    . CHOLECYSTECTOMY  2010  . COLONOSCOPY WITH PROPOFOL N/A 06/07/2018   Procedure:  COLONOSCOPY WITH PROPOFOL;  Surgeon: Lollie Sails, MD;  Location: [facility] ENDOSCOPY;  Service: Endoscopy;  Laterality: N/A;  . CORONARY ARTERY BYPASS GRAFT  01/07/1997  . CORONARY STENT INTERVENTION N/A 10/31/2016   Procedure: Coronary Stent Intervention;  Surgeon: Wellington Hampshire, MD;  Location: [facility] CV LAB;  Service: Cardiovascular;  Laterality: N/A;  . ELECTROPHYSIOLOGIC STUDY N/A 07/14/2015   Procedure: CARDIOVERSION;  Surgeon: Yolonda Kida, MD;  Location: [facility] ORS;  Service: Cardiovascular;  Laterality: N/A;  . ELECTROPHYSIOLOGIC STUDY N/A 10/12/2015   Procedure: CARDIOVERSION;  Surgeon: Minna Merritts, MD;  Location: [facility] ORS;  Service: Cardiovascular;  Laterality: N/A;  . LEFT HEART CATH AND CORONARY ANGIOGRAPHY N/A 10/31/2016   Procedure: Left Heart Cath and Coronary Angiography;  Surgeon: Wellington Hampshire, MD;  Location: [facility];  Service: Cardiovascular;  Laterality: N/A;  . OTHER SURGICAL HISTORY  1998   Bypass  . RIGHT/LEFT HEART CATH AND CORONARY ANGIOGRAPHY N/A 09/18/2017   Procedure: RIGHT/LEFT HEART CATH AND CORONARY ANGIOGRAPHY;  Surgeon: Wellington Hampshire, MD;  Location: [facility];  Service: Cardiovascular;  Laterality: N/A;    FAMILY HISTORY :   Family History  Problem Relation Age of Onset  . Alcohol abuse Father     SOCIAL HISTORY:   Social History   Tobacco Use  . Smoking status: Former Smoker    Packs/day: 1.00    Years: 10.00    Pack years: 10.00    Types: Cigarettes, Pipe, Cigars    Quit date: 04/18/1972    Years since quitting: 48.1  . Smokeless tobacco: Former Systems developer    Types: Chew    Quit date: 04/18/1972  Vaping Use  . Vaping Use: Never used  Substance Use Topics  . Alcohol use: Yes    Alcohol/week: 4.0 standard drinks    Types: 2 Cans of beer, 2 Shots of liquor per week    Comment: per 2 weeks   . Drug use: No    ALLERGIES:  is allergic to pravastatin and prednisone.  MEDICATIONS:  Current Outpatient  Medications  Medication Sig Dispense Refill  . buPROPion (WELLBUTRIN XL) 300 MG 24 hr tablet Take 1 tablet (300 mg total) by mouth daily. 90 tablet 1  . carbidopa-levodopa (SINEMET IR) 25-100 MG tablet Take 1 tablet by mouth 3 (three) times daily. 270 tablet 1  . carbidopa-levodopa (SINEMET IR) 25-250 MG tablet Take 1 tablet by mouth 3 (three) times daily. 270 tablet 3  . carvedilol (COREG) 3.125 MG tablet TAKE TWO TABLETS TWICE A DAY WITH MEALS 120 tablet 0  . ciprofloxacin (CIPRO) 500 MG tablet Take 1 tablet (500 mg total) by mouth 2 (two) times daily. With food 14 tablet 0  . ELIQUIS 5 MG TABS tablet TAKE ONE TABLET BY MOUTH TWICE DAILY (Patient taking differently: Take 5 mg by mouth 2 (two) times daily.) 60 tablet 5  . entacapone (COMTAN) 200 MG tablet Take 1 tablet (200 mg total) by mouth 3 (three) times daily. 270 tablet 3  . esomeprazole (NEXIUM) 40 MG capsule TAKE 1 CAPSULE BY MOUTH ONCE DAILY 30 capsule 2  . feeding supplement (ENSURE ENLIVE / ENSURE PLUS) LIQD Take 237 mLs by mouth 3 (three) times daily between meals. 237 mL 12  . fluticasone (FLONASE) 50 MCG/ACT  nasal spray USE 2 PUFFS IN EACH NOSTRIL DAILY 16 g 3  . furosemide (LASIX) 20 MG tablet Take 1 tablet (20 mg total) by mouth daily. 30 tablet   . hydrocortisone 2.5 % lotion Apply topically daily. Apply to affected areas 4 times per week. 118 mL 3  . ketoconazole (NIZORAL) 2 % cream Apply 1 application topically daily as needed for irritation.    [name] ketoconazole (NIZORAL) 2 % shampoo Apply 1 application topically 3 (three) times a week. Wash scalp, face and ears 120 mL 3  . ketorolac (ACULAR) 0.5 % ophthalmic solution SMARTSIG:1 Drop(s) In Eye(s) Every 4-6 Hours PRN    . lactulose (CHRONULAC) 10 GM/15ML solution TAKE 30 MLS BY MOUTH TWICE DAILY AS NEEDED FOR MODERATE CONSTIPATION 236 mL 0  . levothyroxine (SYNTHROID) 50 MCG tablet TAKE ONE TABLET ON AN EMPTY STOMACH WITHA GLASS OF WATER AT LEAST 30 TO 60 MINUTES BEFORE BREAKFAST  (Patient taking differently: Take 50 mcg by mouth daily before breakfast.) 90 tablet 1  . losartan (COZAAR) 100 MG tablet TAKE 1 TABLET BY MOUTH DAILY (Patient taking differently: Take 100 mg by mouth daily.) 90 tablet 1  . mometasone (ELOCON) 0.1 % cream     . mupirocin ointment (BACTROBAN) 2 % Apply 1 application topically 3 (three) times daily. (Patient taking differently: Apply 1 application topically 3 (three) times daily as needed.) 30 g 2  . potassium chloride SA (KLOR-CON) 10 MEQ tablet Take 1 tablet (10 mEq total) by mouth daily. 90 tablet 1  . PROAIR HFA 108 (90 Base) MCG/ACT inhaler INHALE 2 PUFFS INTO THE LUNGS EVERY SIX HOURS AS NEEDED FOR WHEEZING OR SHORTNESS OF BREATH 8.5 g 1  . QUEtiapine (SEROQUEL) 25 MG tablet Take 1 tablet (25 mg total) by mouth at bedtime. 30 tablet 1  . Respiratory Therapy Supplies (FLUTTER) DEVI 1 Device by Does not apply route daily. 1 each 0  . rosuvastatin (CRESTOR) 10 MG tablet TAKE 1 TABLET BY MOUTH DAILY 90 tablet 0  . sertraline (ZOLOFT) 50 MG tablet Take 1 tablet (50 mg total) by mouth daily. 30 tablet 3  . Tiotropium Bromide-Olodaterol (STIOLTO RESPIMAT) 2.5-2.5 MCG/ACT AERS Inhale 2 puffs into the lungs daily. 1 g 5  . albuterol (VENTOLIN HFA) 108 (90 Base) MCG/ACT inhaler Inhale 2 puffs into the lungs every 6 (six) hours as needed for wheezing or shortness of breath. 8 g 2   No current facility-administered medications for this visit.    PHYSICAL EXAMINATION:   BP 116/86 (BP Location: Left Arm, Patient Position: Sitting, Cuff Size: Normal)   Pulse 74   Temp (!) 97.2 F (36.2 C) (Tympanic)   Resp 16   Ht 5\' 6"  (1.676 m)   Wt 205 lb (93 kg)   SpO2 99%   BMI 33.09 kg/m   Filed Weights   06/10/20 1309  Weight: 205 lb (93 kg)    Physical Exam Constitutional:      Comments: Accompanied by his wife.  HENT:     Head: Normocephalic and atraumatic.     Mouth/Throat:     Pharynx: No oropharyngeal exudate.  Eyes:     Pupils: Pupils are  equal, round, and reactive to light.  Cardiovascular:     Rate and Rhythm: Normal rate and regular rhythm.  Pulmonary:     Effort: No respiratory distress.     Breath sounds: No wheezing.  Abdominal:     General: Bowel sounds are normal. There is no distension.  Palpations: Abdomen is soft. There is no mass.     Tenderness: There is no abdominal tenderness. There is no guarding or rebound.  Musculoskeletal:        General: No tenderness. Normal range of motion.     Cervical back: Normal range of motion and neck supple.  Skin:    General: Skin is warm.  Neurological:     Mental Status: He is alert and oriented to person, place, and time.  Psychiatric:        Mood and Affect: Affect normal.      LABORATORY DATA:  I have reviewed the data as listed    Component Value Date/Time   NA 138 06/10/2020 1240   NA 139 10/09/2017 1441   K 3.7 06/10/2020 1240   CL 103 06/10/2020 1240   CO2 25 06/10/2020 1240   GLUCOSE 161 (H) 06/10/2020 1240   BUN 17 06/10/2020 1240   BUN 23 10/09/2017 1441   CREATININE 0.89 06/10/2020 1240   CREATININE 0.95 12/06/2011 1554   CALCIUM 8.5 (L) 06/10/2020 1240   PROT 6.5 06/10/2020 1240   PROT 7.0 12/03/2015 0842   PROT 7.6 09/20/2011 1529   ALBUMIN 3.5 06/10/2020 1240   ALBUMIN 4.2 12/03/2015 0842   ALBUMIN 3.7 09/20/2011 1529   AST 16 06/10/2020 1240   AST 18 09/20/2011 1529   ALT 13 06/10/2020 1240   ALT 23 09/20/2011 1529   ALKPHOS 86 06/10/2020 1240   ALKPHOS 133 09/20/2011 1529   BILITOT 0.7 06/10/2020 1240   BILITOT 0.8 12/03/2015 0842   BILITOT 0.5 09/20/2011 1529   GFRNONAA >60 06/10/2020 1240   GFRNONAA >60 12/06/2011 1554   GFRAA >60 12/09/2019 1308   GFRAA >60 12/06/2011 1554    No results found for: SPEP, UPEP  Lab Results  Component Value Date   WBC 8.0 06/10/2020   NEUTROABS 5.9 06/10/2020   HGB 15.9 06/10/2020   HCT 49.6 06/10/2020   MCV 91.7 06/10/2020   PLT 124 (L) 06/10/2020      Chemistry      Component  Value Date/Time   NA 138 06/10/2020 1240   NA 139 10/09/2017 1441   K 3.7 06/10/2020 1240   CL 103 06/10/2020 1240   CO2 25 06/10/2020 1240   BUN 17 06/10/2020 1240   BUN 23 10/09/2017 1441   CREATININE 0.89 06/10/2020 1240   CREATININE 0.95 12/06/2011 1554      Component Value Date/Time   CALCIUM 8.5 (L) 06/10/2020 1240   ALKPHOS 86 06/10/2020 1240   ALKPHOS 133 09/20/2011 1529   AST 16 06/10/2020 1240   AST 18 09/20/2011 1529   ALT 13 06/10/2020 1240   ALT 23 09/20/2011 1529   BILITOT 0.7 06/10/2020 1240   BILITOT 0.8 12/03/2015 0842   BILITOT 0.5 09/20/2011 1529        ASSESSMENT & PLAN:   Thrombocytopenia ([place]) # Mild thrombocytopenia platelets-120s. question secondary to alcohol versus liver disease.vs ITP. STABLE  Monitor for now.  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 STABLE.; HCT 49.6-  HOLD phlebotomy.   # CAD/-stenting [Dr.Arida] A. Fib- on Eliquis per cardiology/ see above. STABLE.   #Interstitial lung disease- on Nintedanib-STABLE.   # Hx of Falls-parkinsons/hallucinations [Dr.Willis; [facility]]  # I spoke to his wife; the above plan of care.  # DISPOSITION:  # NO Phlebotomy today # Follow up in  6 months-MD labs- cbc/cmp- Dr.B  Cc; Dr.Sonnenberg  Cammie Sickle, MD 06/10/2020 2:09 PM

## 2020-06-10 NOTE — Telephone Encounter (Signed)
Called and spoke with pt's wife [name] letting her know the info stated by [facility] and she verbalized understanding. Nothing further needed.

## 2020-06-10 NOTE — Telephone Encounter (Signed)
Chart check shows bmet done on 06/02/20 ,    Note says ABG , this typically is set up with Resp . Do you still need this , do not see order . ?

## 2020-06-10 NOTE — Telephone Encounter (Signed)
CO2 was normal on BMET 06/02/20. He does not need ABGs at this time. Follow up with neurology regarding concern about dementia.

## 2020-06-10 NOTE — Telephone Encounter (Signed)
LVM informing the patient that the lab results are not resulted but when they are I will call them and let them know.  Andre [name],cma

## 2020-06-11 ENCOUNTER — Ambulatory Visit: Payer: PPO | Admitting: Dermatology

## 2020-06-12 NOTE — Telephone Encounter (Signed)
I informed the patient of his lab results and he understood.  Dimitri Dsouza,cma

## 2020-06-16 DIAGNOSIS — J449 Chronic obstructive pulmonary disease, unspecified: Secondary | ICD-10-CM | POA: Diagnosis not present

## 2020-06-16 DIAGNOSIS — M5030 Other cervical disc degeneration, unspecified cervical region: Secondary | ICD-10-CM | POA: Diagnosis not present

## 2020-06-16 DIAGNOSIS — R531 Weakness: Secondary | ICD-10-CM | POA: Diagnosis not present

## 2020-06-16 DIAGNOSIS — I2510 Atherosclerotic heart disease of native coronary artery without angina pectoris: Secondary | ICD-10-CM | POA: Diagnosis not present

## 2020-06-16 DIAGNOSIS — K219 Gastro-esophageal reflux disease without esophagitis: Secondary | ICD-10-CM | POA: Diagnosis not present

## 2020-06-16 DIAGNOSIS — M5136 Other intervertebral disc degeneration, lumbar region: Secondary | ICD-10-CM | POA: Diagnosis not present

## 2020-06-16 DIAGNOSIS — R7303 Prediabetes: Secondary | ICD-10-CM | POA: Diagnosis not present

## 2020-06-16 DIAGNOSIS — I110 Hypertensive heart disease with heart failure: Secondary | ICD-10-CM | POA: Diagnosis not present

## 2020-06-16 DIAGNOSIS — I5032 Chronic diastolic (congestive) heart failure: Secondary | ICD-10-CM | POA: Diagnosis not present

## 2020-06-16 DIAGNOSIS — J8410 Pulmonary fibrosis, unspecified: Secondary | ICD-10-CM | POA: Diagnosis not present

## 2020-06-16 DIAGNOSIS — F0280 Dementia in other diseases classified elsewhere without behavioral disturbance: Secondary | ICD-10-CM | POA: Diagnosis not present

## 2020-06-16 DIAGNOSIS — E860 Dehydration: Secondary | ICD-10-CM | POA: Diagnosis not present

## 2020-06-16 DIAGNOSIS — G4710 Hypersomnia, unspecified: Secondary | ICD-10-CM | POA: Diagnosis not present

## 2020-06-16 DIAGNOSIS — K2270 Barrett's esophagus without dysplasia: Secondary | ICD-10-CM | POA: Diagnosis not present

## 2020-06-16 DIAGNOSIS — G4733 Obstructive sleep apnea (adult) (pediatric): Secondary | ICD-10-CM | POA: Diagnosis not present

## 2020-06-16 DIAGNOSIS — I480 Paroxysmal atrial fibrillation: Secondary | ICD-10-CM | POA: Diagnosis not present

## 2020-06-16 DIAGNOSIS — D649 Anemia, unspecified: Secondary | ICD-10-CM | POA: Diagnosis not present

## 2020-06-16 DIAGNOSIS — I700 Atherosclerosis of aorta: Secondary | ICD-10-CM | POA: Diagnosis not present

## 2020-06-16 DIAGNOSIS — E039 Hypothyroidism, unspecified: Secondary | ICD-10-CM | POA: Diagnosis not present

## 2020-06-16 DIAGNOSIS — G20 Parkinson's disease: Secondary | ICD-10-CM | POA: Diagnosis not present

## 2020-06-16 DIAGNOSIS — F418 Other specified anxiety disorders: Secondary | ICD-10-CM | POA: Diagnosis not present

## 2020-06-16 DIAGNOSIS — E785 Hyperlipidemia, unspecified: Secondary | ICD-10-CM | POA: Diagnosis not present

## 2020-06-16 DIAGNOSIS — D696 Thrombocytopenia, unspecified: Secondary | ICD-10-CM | POA: Diagnosis not present

## 2020-06-16 DIAGNOSIS — M5382 Other specified dorsopathies, cervical region: Secondary | ICD-10-CM | POA: Diagnosis not present

## 2020-06-16 DIAGNOSIS — J309 Allergic rhinitis, unspecified: Secondary | ICD-10-CM | POA: Diagnosis not present

## 2020-06-18 ENCOUNTER — Telehealth: Payer: Self-pay | Admitting: Family Medicine

## 2020-06-18 ENCOUNTER — Telehealth: Payer: Self-pay | Admitting: Neurology

## 2020-06-18 MED ORDER — QUETIAPINE FUMARATE 25 MG PO TABS: ORAL_TABLET | ORAL | 1 refills | Status: DC

## 2020-06-18 NOTE — Telephone Encounter (Signed)
Patient is taking QUEtiapine (SEROQUEL) 25 MG tablet and only has 3 remaining. The insurance will not refill because patient used medication up too quickly. [name] would like a new prescription stating to take 1 to 1 and half daily. Pharmacy is [facility].

## 2020-06-18 NOTE — Telephone Encounter (Signed)
The prescription for the Seroquel was refilled.

## 2020-06-18 NOTE — Telephone Encounter (Signed)
Pt's wife, Andre [name] (on [place]) called, wanted to know if Dr. Jannifer Franklin could fill QUEtiapine (SEROQUEL) 25 MG tablet.  Informed Ms. [name], Dr. Jannifer Franklin did not prescribe this medication; Dr.Sonnenberg, Angela Adam, MD is the physician prescribed. [name] said she understood and would call his office. Please advise

## 2020-06-18 NOTE — Telephone Encounter (Signed)
She needs to call Dr. Mena Goes neurology about this medication for him  Its for dementia/agitation

## 2020-06-22 ENCOUNTER — Ambulatory Visit (INDEPENDENT_AMBULATORY_CARE_PROVIDER_SITE_OTHER): Payer: HMO

## 2020-06-22 ENCOUNTER — Other Ambulatory Visit: Payer: Self-pay

## 2020-06-22 DIAGNOSIS — E538 Deficiency of other specified B group vitamins: Secondary | ICD-10-CM

## 2020-06-22 MED ORDER — CYANOCOBALAMIN 1000 MCG/ML IJ SOLN
1000.0000 ug | Freq: Once | INTRAMUSCULAR | Status: AC
  Administered 2020-06-22: 1000 ug via INTRAMUSCULAR

## 2020-06-22 NOTE — Progress Notes (Signed)
Patient presented for B 12 injection to left deltoid, patient voiced no concerns nor showed any signs of distress during injection. 

## 2020-06-25 ENCOUNTER — Ambulatory Visit (INDEPENDENT_AMBULATORY_CARE_PROVIDER_SITE_OTHER): Payer: HMO | Admitting: Cardiovascular Disease

## 2020-06-25 ENCOUNTER — Encounter: Payer: Self-pay | Admitting: Cardiovascular Disease

## 2020-06-25 ENCOUNTER — Other Ambulatory Visit: Payer: Self-pay

## 2020-06-25 VITALS — BP 130/68 | HR 81 | Ht 66.0 in | Wt 210.0 lb

## 2020-06-25 DIAGNOSIS — I4821 Permanent atrial fibrillation: Secondary | ICD-10-CM

## 2020-06-25 DIAGNOSIS — I10 Essential (primary) hypertension: Secondary | ICD-10-CM

## 2020-06-25 DIAGNOSIS — I5032 Chronic diastolic (congestive) heart failure: Secondary | ICD-10-CM

## 2020-06-25 DIAGNOSIS — I2510 Atherosclerotic heart disease of native coronary artery without angina pectoris: Secondary | ICD-10-CM

## 2020-06-25 DIAGNOSIS — E785 Hyperlipidemia, unspecified: Secondary | ICD-10-CM

## 2020-06-25 NOTE — Progress Notes (Signed)
Cardiology Office Note   Date:  06/25/2020   ID:  Andre [name], DOB Mar 31, 1936, MRN [id number]  PCP:  [name], MD  Cardiologist:   [name], MD   Chief Complaint  Patient presents with  . Follow-up    6 months      History of Present Illness: Andre [name] is a 85 y.o. male who presents for a follow-up visit regarding coronary artery disease and atrial fibrillation.   He has known history of coronary artery disease status post 1 vessel CABG with LIMA to LAD in 1998, hypertension, hyperlipidemia, chronic diastolic heart aure, permanent atrial fibrillation and morbid obesity. He also has history of pulmonary embolism and Parkinson's. He was hospitalized in July of 2018 with a small non-ST elevation myocardial infarction. Echocardiogram showed normal LV systolic function with severely dilated left atrium. Cardiac catheterization showed occluded native LAD with patent LIMA, severe proximal RCA stenosis with heavy thrombus and moderate distal left main stenosis with significant ostial stenosis in the ramus branch. EF was 50-55% with mildly elevated left ventricular end-diastolic pressure. I performed successful angioplasty and drug-eluting stent placement to the right coronary artery which was complicated by embolization into a large RV branch at the lesion site. This was treated with balloon angioplasty.  A right and left cardiac catheterization was done in June of 2019 due to exertional dyspnea which showed  patent LIMA to LAD and patent RCA stent.  There was significant ostial disease in ramus branch as well as first diagonal.  Both of these were unchanged from previous cardiac catheterization.  Right heart catheterization showed only mildly elevated filling pressures, minimal pulmonary hypertension and normal cardiac output.  Based on that, I felt that his symptoms were mostly noncardiac. Shortness of breath was felt to be multifactorial due to chronic diastolic heart  failure, lung disease, physical deconditioning and possible allergies.      He is being treated by pulmonary for pulmonary fibrosis.    He was hospitalized in January at [facility] with a fall and generalized weakness.  He reported gradual decline in functional status since November.  He reported poor appetite and decreased fluid intake.  His renal function was normal but he was thought to be volume depleted.  BNP was only 154.  Covid negative.  He is doing better overall.  No chest pain or worsening dyspnea.  He has some dizziness related to Parkinson's.  His appetite has improved and his weight increased 5 pounds since last month.  Past Medical History:  Diagnosis Date  . Atherosclerosis of abdominal aorta ([place])   . Basal cell carcinoma 03/04/2008   Right nose supratip.   [name] CAD (coronary artery disease)   . Cervical spondylosis 10/01/2013  . Chronic diastolic CHF (congestive heart failure) ([place])    a. 07/2016 Echo: >55%; b. 10/2016 Echo: EF 55-60%, Gr1 DD, Ao sclerosis w/o stenosis, sev dil LA; c. 08/2017 Echo: EF 60-65%, no rwma, Gr2 DD, mild AS, sev dil LA/RA.  [name] Coronary artery disease    a. 1998 s/p mini-cabg @ [facility] - LIMA->LAD;  b. 07/2016 St Echo: Inadequate HR w/ HTN response;  c.  08/2016 MV: EF 67%, no ischemia; d. 10/2016 NSTEMI/Cath: RCA 95p (4.0x26 Onyx DES), LIMA->LAD nl; e. 09/2017 Cath: LM 40/30, LAD 100ost, RI 80, LCX nl, OM2/3 nl, RCA patent stent, 53m, LIMA->LAD nl-->Med Rx.  . DDD (degenerative disc disease), cervical   . DDD (degenerative disc disease), lumbar   . Dementia with parkinsonism ([place])  06/03/2020  . Depression   . Gait abnormality 07/31/2019  . GERD (gastroesophageal reflux disease)   . Hyperlipidemia   . Hypertension   . Hypothyroidism   . PAF (paroxysmal atrial fibrillation) (HCC)    a. s/p DCCV-->maintaining sinus on amiodarone;  b. CHA2DS2VASc = 5-->eliquis.  . Parkinson's disease ([place])    tremors  . Pleural effusion, right    a. 09/2017 s/p thoracentesis.  [name] PNA  (pneumonia) 08/26/2017  . Pulmonary embolism ([place]) 2011  . Pulmonary fibrosis ([place])   . 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 Sleep apnea    wears CPAP  . Squamous cell carcinoma of skin 03/19/2015   Right lateral crown. KA-like pattern  . Thrombocytopenia ([place])     Past Surgical History:  Procedure Laterality Date  . BACK SURGERY  1960  . CARDIAC CATHETERIZATION    . CHOLECYSTECTOMY  2010  . COLONOSCOPY WITH PROPOFOL N/A 06/07/2018   Procedure: COLONOSCOPY WITH PROPOFOL;  Surgeon: Lollie Sails, MD;  Location: [facility] END[facility]COPY;  Service: Endoscopy;  Laterality: N/A;  . CORONARY ARTERY BYPASS GRAFT  01/07/1997  . CORONARY STENT INTERVENTION N/A 10/31/2016   Procedure: Coronary Stent Intervention;  Surgeon: Wellington Hampshire, MD;  Location: [place] CV LAB;  Service: Cardiovascular;  Laterality: N/A;  . ELECTROPHYSIOLOGIC STUDY N/A 07/14/2015   Procedure: CARDIOVERSION;  Surgeon: Yolonda Kida, MD;  Location: [facility] ORS;  Service: Cardiovascular;  Laterality: N/A;  . ELECTROPHYSIOLOGIC STUDY N/A 10/12/2015   Procedure: CARDIOVERSION;  Surgeon: Minna Merritts, MD;  Location: [facility] ORS;  Service: Cardiovascular;  Laterality: N/A;  . LEFT HEART CATH AND CORONARY ANGIOGRAPHY N/A 10/31/2016   Procedure: Left Heart Cath and Coronary Angiography;  Surgeon: Wellington Hampshire, MD;  Location: [place] CV LAB;  Service: Cardiovascular;  Laterality: N/A;  . OTHER SURGICAL HISTORY  1998   Bypass  . RIGHT/LEFT HEART CATH AND CORONARY ANGIOGRAPHY N/A 09/18/2017   Procedure: RIGHT/LEFT HEART CATH AND CORONARY ANGIOGRAPHY;  Surgeon: Wellington Hampshire, MD;  Location: [place] CV LAB;  Service: Cardiovascular;  Laterality: N/A;     Current Outpatient Medications  Medication Sig Dispense Refill  . albuterol (VENTOLIN HFA) 108 (90 Base) MCG/ACT inhaler Inhale 2 puffs into the lungs every 6 (six) hours as needed for wheezing or shortness of breath. 8 g 2  . apixaban (ELIQUIS) 5 MG TABS  tablet Take 5 mg by mouth 2 (two) times daily.    [name] buPROPion (WELLBUTRIN XL) 300 MG 24 hr tablet Take 1 tablet (300 mg total) by mouth daily. 90 tablet 1  . carbidopa-levodopa (SINEMET IR) 25-100 MG tablet Take 1 tablet by mouth 3 (three) times daily. 270 tablet 1  . carbidopa-levodopa (SINEMET IR) 25-250 MG tablet Take 1 tablet by mouth 3 (three) times daily. 270 tablet 3  . carvedilol (COREG) 3.125 MG tablet TAKE TWO TABLETS TWICE A DAY WITH MEALS 120 tablet 0  . ciprofloxacin (CIPRO) 500 MG tablet Take 1 tablet (500 mg total) by mouth 2 (two) times daily. With food 14 tablet 0  . entacapone (COMTAN) 200 MG tablet Take 1 tablet (200 mg total) by mouth 3 (three) times daily. 270 tablet 3  . esomeprazole (NEXIUM) 40 MG capsule TAKE 1 CAPSULE BY MOUTH ONCE DAILY 30 capsule 2  . feeding supplement (ENSURE ENLIVE / ENSURE PLUS) LIQD Take 237 mLs by mouth 3 (three) times daily between meals. 237 mL 12  . fluticasone (FLONASE) 50 MCG/ACT nasal spray USE 2 PUFFS IN EACH NOSTRIL DAILY 16  g 3  . furosemide (LASIX) 20 MG tablet Take 1 tablet (20 mg total) by mouth daily. 30 tablet   . hydrocortisone 2.5 % lotion Apply topically daily. Apply to affected areas 4 times per week. 118 mL 3  . ketoconazole (NIZORAL) 2 % cream Apply 1 application topically daily as needed for irritation.    [name] ketoconazole (NIZORAL) 2 % shampoo Apply 1 application topically 3 (three) times a week. Wash scalp, face and ears 120 mL 3  . ketorolac (ACULAR) 0.5 % ophthalmic solution SMARTSIG:1 Drop(s) In Eye(s) Every 4-6 Hours PRN    . lactulose (CHRONULAC) 10 GM/15ML solution TAKE 30 MLS BY MOUTH TWICE DAILY AS NEEDED FOR MODERATE CONSTIPATION 236 mL 0  . levothyroxine (SYNTHROID) 50 MCG tablet Take 50 mcg by mouth daily before breakfast.    . losartan (COZAAR) 100 MG tablet Take 100 mg by mouth daily.    . mometasone (ELOCON) 0.1 % cream     . mupirocin ointment (BACTROBAN) 2 % Apply 1 application topically 3 (three) times daily as  needed.    . potassium chloride SA (KLOR-CON) 10 MEQ tablet Take 1 tablet (10 mEq total) by mouth daily. 90 tablet 1  . PROAIR HFA 108 (90 Base) MCG/ACT inhaler INHALE 2 PUFFS INTO THE LUNGS EVERY SIX HOURS AS NEEDED FOR WHEEZING OR SHORTNESS OF BREATH 8.5 g 1  . QUEtiapine (SEROQUEL) 25 MG tablet 1/2 tablet in the afternoon, 1 tablet at night 135 tablet 1  . Respiratory Therapy Supplies (FLUTTER) DEVI 1 Device by Does not apply route daily. 1 each 0  . rosuvastatin (CRESTOR) 10 MG tablet TAKE 1 TABLET BY MOUTH DAILY 90 tablet 0  . sertraline (ZOLOFT) 50 MG tablet Take 1 tablet (50 mg total) by mouth daily. 30 tablet 3  . Tiotropium Bromide-Olodaterol (STIOLTO RESPIMAT) 2.5-2.5 MCG/ACT AERS Inhale 2 puffs into the lungs daily. 1 g 5   No current facility-administered medications for this visit.    Allergies:   Pravastatin and Prednisone    Social History:  The patient  reports that he quit smoking about 48 years ago. His smoking use included cigarettes, pipe, and cigars. He has a 10.00 pack-year smoking history. He quit smokeless tobacco use about 48 years ago.  His smokeless tobacco use included chew. He reports current alcohol use of about 4.0 standard drinks of alcohol per week. He reports that he does not use drugs.   Family History:  The patient's family history includes Alcohol abuse in his father.    ROS:  Please see the history of present illness.   Otherwise, review of systems are positive for none.   All other systems are reviewed and negative.    PHYSICAL EXAM: VS:  BP 130/68   Pulse 81   Ht 5\' 6"  (1.676 m)   Wt 210 lb (95.3 kg)   BMI 33.89 kg/m  , BMI Body mass index is 33.89 kg/m. GEN: Well nourished, well developed, in no acute distress  HEENT: normal  Neck: no JVD, carotid bruits, or masses Cardiac: Irregularly irregular; no murmurs, rubs, or gallops, trace bilateral leg edema Respiratory:  clear to auscultation bilaterally with mildly diminished breath sounds,  normal work of breathing GI: soft, nontender, nondistended, + BS MS: no deformity or atrophy  Skin: warm and dry, no rash Neuro:  Strength and sensation are intact Psych: euthymic mood, full affect  EKG:  EKG is ordered today. The ekg ordered today demonstrates coarse atrial fibrillation with ventricular rate of 81  bpm.  Nonspecific inferior T wave changes.   Recent Labs: 08/21/2019: Magnesium 1.8 04/30/2020: B Natriuretic Peptide 154.0 06/02/2020: TSH 2.77 06/10/2020: ALT 13; BUN 17; Creatinine, Ser 0.89; Hemoglobin 15.9; Platelets 124; Potassium 3.7; Sodium 138    Lipid Panel    Component Value Date/Time   CHOL 146 08/27/2017 0419   TRIG 134 08/27/2017 0419   HDL 43 08/27/2017 0419   CHOLHDL 3.4 08/27/2017 0419   VLDL 27 08/27/2017 0419   LDLCALC 76 08/27/2017 0419      Wt Readings from Last 3 Encounters:  06/25/20 210 lb (95.3 kg)  06/10/20 205 lb (93 kg)  06/03/20 207 lb (93.9 kg)       No flowsheet data found.    ASSESSMENT AND PLAN:  1. Coronary artery disease involving native coronary arteries without angina: He is doing reasonably well from a cardiac standpoint with no anginal symptoms.  Continue medical therapy.  2.  Permanent atrial fibrillation: Ventricular rate is reasonably controlled on small dose carvedilol.  He continues to be on Eliquis 5 mg twice daily.  I reviewed most recent labs done in February which showed normal hemoglobin.  He does have thrombocytopenia at 124,000 but that has been stable.  Renal function remains normal.  3. Essential hypertension: Blood pressure is controlled on current medications.  4. Hyperlipidemia: He had myalgia with atorvastatin but he is tolerating rosuvastatin.  Most recent LDL was 76.  5.  Chronic diastolic heart failure: He appears to be euvolemic on small dose furosemide 20 mg daily.  This dose has gradually been decreased given decreased oral intake.   Disposition:   FU with me in 6  months  Signed,  [name], MD  06/25/2020 11:23 AM    [place]

## 2020-06-25 NOTE — Patient Instructions (Signed)

## 2020-06-26 ENCOUNTER — Ambulatory Visit: Payer: PPO | Admitting: Pulmonary Disease

## 2020-06-29 ENCOUNTER — Other Ambulatory Visit: Payer: Self-pay | Admitting: Primary Care

## 2020-06-29 ENCOUNTER — Telehealth: Payer: Self-pay | Admitting: Pulmonary Disease

## 2020-06-29 NOTE — Telephone Encounter (Signed)
PM pts wife calling and asking if the stiolto has an alternative due to the cost of the medication.  The coupons will not help them as he has medicare.  Please advise. Thanks

## 2020-06-30 ENCOUNTER — Other Ambulatory Visit: Payer: Self-pay | Admitting: Family Medicine

## 2020-06-30 DIAGNOSIS — F419 Anxiety disorder, unspecified: Secondary | ICD-10-CM

## 2020-06-30 DIAGNOSIS — F32A Depression, unspecified: Secondary | ICD-10-CM

## 2020-07-01 DIAGNOSIS — J449 Chronic obstructive pulmonary disease, unspecified: Secondary | ICD-10-CM | POA: Diagnosis not present

## 2020-07-01 DIAGNOSIS — G20 Parkinson's disease: Secondary | ICD-10-CM | POA: Diagnosis not present

## 2020-07-01 DIAGNOSIS — D649 Anemia, unspecified: Secondary | ICD-10-CM | POA: Diagnosis not present

## 2020-07-01 DIAGNOSIS — M5382 Other specified dorsopathies, cervical region: Secondary | ICD-10-CM | POA: Diagnosis not present

## 2020-07-01 DIAGNOSIS — R7303 Prediabetes: Secondary | ICD-10-CM | POA: Diagnosis not present

## 2020-07-01 DIAGNOSIS — K219 Gastro-esophageal reflux disease without esophagitis: Secondary | ICD-10-CM | POA: Diagnosis not present

## 2020-07-01 DIAGNOSIS — I700 Atherosclerosis of aorta: Secondary | ICD-10-CM | POA: Diagnosis not present

## 2020-07-01 DIAGNOSIS — F0280 Dementia in other diseases classified elsewhere without behavioral disturbance: Secondary | ICD-10-CM | POA: Diagnosis not present

## 2020-07-01 DIAGNOSIS — E039 Hypothyroidism, unspecified: Secondary | ICD-10-CM | POA: Diagnosis not present

## 2020-07-01 DIAGNOSIS — I110 Hypertensive heart disease with heart failure: Secondary | ICD-10-CM | POA: Diagnosis not present

## 2020-07-01 DIAGNOSIS — E860 Dehydration: Secondary | ICD-10-CM | POA: Diagnosis not present

## 2020-07-01 DIAGNOSIS — J309 Allergic rhinitis, unspecified: Secondary | ICD-10-CM | POA: Diagnosis not present

## 2020-07-01 DIAGNOSIS — J8410 Pulmonary fibrosis, unspecified: Secondary | ICD-10-CM | POA: Diagnosis not present

## 2020-07-01 DIAGNOSIS — K2270 Barrett's esophagus without dysplasia: Secondary | ICD-10-CM | POA: Diagnosis not present

## 2020-07-01 DIAGNOSIS — I2510 Atherosclerotic heart disease of native coronary artery without angina pectoris: Secondary | ICD-10-CM | POA: Diagnosis not present

## 2020-07-01 DIAGNOSIS — I480 Paroxysmal atrial fibrillation: Secondary | ICD-10-CM | POA: Diagnosis not present

## 2020-07-01 DIAGNOSIS — R531 Weakness: Secondary | ICD-10-CM | POA: Diagnosis not present

## 2020-07-01 DIAGNOSIS — M5030 Other cervical disc degeneration, unspecified cervical region: Secondary | ICD-10-CM | POA: Diagnosis not present

## 2020-07-01 DIAGNOSIS — G4710 Hypersomnia, unspecified: Secondary | ICD-10-CM | POA: Diagnosis not present

## 2020-07-01 DIAGNOSIS — F418 Other specified anxiety disorders: Secondary | ICD-10-CM | POA: Diagnosis not present

## 2020-07-01 DIAGNOSIS — G4733 Obstructive sleep apnea (adult) (pediatric): Secondary | ICD-10-CM | POA: Diagnosis not present

## 2020-07-01 DIAGNOSIS — M5136 Other intervertebral disc degeneration, lumbar region: Secondary | ICD-10-CM | POA: Diagnosis not present

## 2020-07-01 DIAGNOSIS — E785 Hyperlipidemia, unspecified: Secondary | ICD-10-CM | POA: Diagnosis not present

## 2020-07-01 DIAGNOSIS — I5032 Chronic diastolic (congestive) heart failure: Secondary | ICD-10-CM | POA: Diagnosis not present

## 2020-07-01 DIAGNOSIS — D696 Thrombocytopenia, unspecified: Secondary | ICD-10-CM | POA: Diagnosis not present

## 2020-07-01 NOTE — Telephone Encounter (Signed)
Alternatives are Anoro inhaler.  Can we check if this is cheaper through his insurance?

## 2020-07-01 NOTE — Telephone Encounter (Signed)
Attempted to call pt's wife Patricia but unable to reach. Left message for her to return call. 

## 2020-07-03 MED ORDER — ANORO ELLIPTA 62.5-25 MCG/INH IN AEPB: 1.0000 | INHALATION_SPRAY | Freq: Every day | RESPIRATORY_TRACT | 1 refills | Status: DC

## 2020-07-03 NOTE — Telephone Encounter (Signed)
Called and spoke with the pts wife and she requested that the anoro be sent to the pharmacy.  I advised her it said that it would be $15 for 30 day supply.  She is going to have the pharmacy run it and see what it will be.  She is to call back for any issues.

## 2020-07-09 ENCOUNTER — Other Ambulatory Visit: Payer: Self-pay

## 2020-07-09 ENCOUNTER — Ambulatory Visit (INDEPENDENT_AMBULATORY_CARE_PROVIDER_SITE_OTHER): Payer: HMO | Admitting: Urology

## 2020-07-09 ENCOUNTER — Encounter: Payer: Self-pay | Admitting: Urology

## 2020-07-09 VITALS — BP 132/80 | HR 79 | Ht 67.0 in | Wt 208.0 lb

## 2020-07-09 DIAGNOSIS — R350 Frequency of micturition: Secondary | ICD-10-CM

## 2020-07-09 LAB — BLADDER SCAN AMB NON-IMAGING: Scan Result: 56

## 2020-07-09 MED ORDER — SILODOSIN 8 MG PO CAPS: 8.0000 mg | ORAL_CAPSULE | Freq: Every day | ORAL | 3 refills | Status: DC

## 2020-07-09 NOTE — Progress Notes (Signed)
07/09/2020 2:49 PM   Andre [name] June 09, 1935 [id number]  Referring provider: [name], MD [address] [place],  [place] [address]  Chief Complaint  Patient presents with  . Urinary Frequency    HPI: 85 y.o. male presents for evaluation of bothersome lower urinary tract symptoms.   78-month history of bothersome lower urinary tract symptoms including frequency, intermittent urinary stream, urgency, weak urinary stream  Occasional episodes of urge incontinence  Denies dysuria, gross hematuria  Denies flank, abdominal or pelvic pain  IPSS today 25/35  Hematuria evaluation 2019; CTU with large left renal cysts and cystoscopy showing moderate lateral lobe enlargement with hypervascularity  He apparently has been seen by Dr. Yves Dill follow-up in the past and had been on Myrbetriq but this was discontinued   PMH: Past Medical History:  Diagnosis Date  . Atherosclerosis of abdominal aorta ([place])   . Basal cell carcinoma 03/04/2008   Right nose supratip.   [name] CAD (coronary artery disease)   . Cervical spondylosis 10/01/2013  . Chronic diastolic CHF (congestive heart failure) ([place])    a. 07/2016 Echo: >55%; b. 10/2016 Echo: EF 55-60%, Gr1 DD, Ao sclerosis w/o stenosis, sev dil LA; c. 08/2017 Echo: EF 60-65%, no rwma, Gr2 DD, mild AS, sev dil LA/RA.  [name] Coronary artery disease    a. 1998 s/p mini-cabg @ [facility] - LIMA->LAD;  b. 07/2016 St Echo: Inadequate HR w/ HTN response;  c.  08/2016 MV: EF 67%, no ischemia; d. 10/2016 NSTEMI/Cath: RCA 95p (4.0x26 Onyx DES), LIMA->LAD nl; e. 09/2017 Cath: LM 40/30, LAD 100ost, RI 80, LCX nl, OM2/3 nl, RCA patent stent, 64m, LIMA->LAD nl-->Med Rx.  . DDD (degenerative disc disease), cervical   . DDD (degenerative disc disease), lumbar   . Dementia with parkinsonism ([place]) 06/03/2020  . Depression   . Gait abnormality 07/31/2019  . GERD (gastroesophageal reflux disease)   . Hyperlipidemia   . Hypertension   . Hypothyroidism   . PAF  (paroxysmal atrial fibrillation) (H[facility])    a. s/p DCCV-->maintaining sinus on amiodarone;  b. CHA2DS2VASc = 5-->eliquis.  . Parkinson's disease ([place])    tremors  . Pleural effusion, right    a. 09/2017 s/p thoracentesis.  [name] PNA (pneumonia) 08/26/2017  . Pulmonary embolism ([place]) 2011  . Pulmonary fibrosis ([place])   . 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 Sleep apnea    wears CPAP  . Squamous cell carcinoma of skin 03/19/2015   Right lateral crown. KA-like pattern  . Thrombocytopenia [facility])     Surgical History: Past Surgical History:  Procedure Laterality Date  . BACK SURGERY  1960  . CARDIAC CATHETERIZATION    . CHOLECYSTECTOMY  2010  . COLONOSCOPY WITH PROPOFOL N/A 06/07/2018   Procedure: COLONOSCOPY WITH PROPOFOL;  Surgeon: Lollie Sails, MD;  Location: [facility] ENDOSCOPY;  Service: Endoscopy;  Laterality: N/A;  . CORONARY ARTERY BYPASS GRAFT  01/07/1997  . CORONARY STENT INTERVENTION N/A 10/31/2016   Procedure: Coronary Stent Intervention;  Surgeon: Wellington Hampshire, MD;  Location: [place] CV LAB;  Service: Cardiovascular;  Laterality: N/A;  . ELECTROPHYSIOLOGIC STUDY N/A 07/14/2015   Procedure: CARDIOVERSION;  Surgeon: Yolonda Kida, MD;  Location: [facility] ORS;  Service: Cardiovascular;  Laterality: N/A;  . ELECTROPHYSIOLOGIC STUDY N/A 10/12/2015   Procedure: CARDIOVERSION;  Surgeon: Minna Merritts, MD;  Location: [facility] ORS;  Service: Cardiovascular;  Laterality: N/A;  . LEFT HEART CATH AND CORONARY ANGIOGRAPHY N/A 10/31/2016   Procedure: Left Heart Cath and Coronary Angiography;  Surgeon: Wellington Hampshire, MD;  Location: [place] CV LAB;  Service: Cardiovascular;  Laterality: N/A;  . OTHER SURGICAL HISTORY  1998   Bypass  . RIGHT/LEFT HEART CATH AND CORONARY ANGIOGRAPHY N/A 09/18/2017   Procedure: RIGHT/LEFT HEART CATH AND CORONARY ANGIOGRAPHY;  Surgeon: Wellington Hampshire, MD;  Location: [place] CV LAB;  Service: Cardiovascular;  Laterality: N/A;    Home  Medications:  Allergies as of 07/09/2020      Reactions   Pravastatin Other (See Comments)   Prednisone Other (See Comments)   Pt states that med makes him hyper Pt states that med makes him hyper      Medication List       Accurate as of July 09, 2020  2:49 PM. If you have any questions, ask your nurse or doctor.        albuterol 108 (90 Base) MCG/ACT inhaler Commonly known as: VENTOLIN HFA Inhale 2 puffs into the lungs every 6 (six) hours as needed for wheezing or shortness of breath.   ProAir HFA 108 (90 Base) MCG/ACT inhaler Generic drug: albuterol INHALE 2 PUFFS INTO THE LUNGS EVERY SIX HOURS AS NEEDED FOR WHEEZING OR SHORTNESS OF BREATH   Anoro Ellipta 62.5-25 MCG/INH Aepb Generic drug: umeclidinium-vilanterol Inhale 1 puff into the lungs daily.   apixaban 5 MG Tabs tablet Commonly known as: ELIQUIS Take 5 mg by mouth 2 (two) times daily.   buPROPion 300 MG 24 hr tablet Commonly known as: Wellbutrin XL Take 1 tablet (300 mg total) by mouth daily.   carbidopa-levodopa 25-250 MG tablet Commonly known as: SINEMET IR Take 1 tablet by mouth 3 (three) times daily.   carbidopa-levodopa 25-100 MG tablet Commonly known as: SINEMET IR Take 1 tablet by mouth 3 (three) times daily.   carvedilol 3.125 MG tablet Commonly known as: COREG TAKE TWO TABLETS TWICE A DAY WITH MEALS   ciprofloxacin 500 MG tablet Commonly known as: Cipro Take 1 tablet (500 mg total) by mouth 2 (two) times daily. With food   entacapone 200 MG tablet Commonly known as: COMTAN Take 1 tablet (200 mg total) by mouth 3 (three) times daily.   esomeprazole 40 MG capsule Commonly known as: NEXIUM TAKE 1 CAPSULE BY MOUTH ONCE DAILY   feeding supplement Liqd Take 237 mLs by mouth 3 (three) times daily between meals.   fluticasone 50 MCG/ACT nasal spray Commonly known as: FLONASE USE 2 PUFFS IN EACH NOSTRIL DAILY   Flutter Devi 1 Device by Does not apply route daily.   furosemide 20 MG  tablet Commonly known as: LASIX Take 1 tablet (20 mg total) by mouth daily.   hydrocortisone 2.5 % lotion Apply topically daily. Apply to affected areas 4 times per week.   ketoconazole 2 % cream Commonly known as: NIZORAL Apply 1 application topically daily as needed for irritation.   ketoconazole 2 % shampoo Commonly known as: NIZORAL Apply 1 application topically 3 (three) times a week. Wash scalp, face and ears   ketorolac 0.5 % ophthalmic solution Commonly known as: ACULAR SMARTSIG:1 Drop(s) In Eye(s) Every 4-6 Hours PRN   lactulose 10 GM/15ML solution Commonly known as: CHRONULAC TAKE 30 MLS BY MOUTH TWICE DAILY AS NEEDED FOR MODERATE CONSTIPATION   levothyroxine 50 MCG tablet Commonly known as: SYNTHROID Take 50 mcg by mouth daily before breakfast.   losartan 100 MG tablet Commonly known as: COZAAR Take 100 mg by mouth daily.   mometasone 0.1 % cream Commonly known as: ELOCON   mupirocin  ointment 2 % Commonly known as: BACTROBAN Apply 1 application topically 3 (three) times daily as needed.   potassium chloride 10 MEQ tablet Commonly known as: KLOR-CON Take 1 tablet (10 mEq total) by mouth daily.   QUEtiapine 25 MG tablet Commonly known as: SEROquel 1/2 tablet in the afternoon, 1 tablet at night   rosuvastatin 10 MG tablet Commonly known as: CRESTOR TAKE 1 TABLET BY MOUTH DAILY   sertraline 50 MG tablet Commonly known as: ZOLOFT TAKE 1 TABLET BY MOUTH DAILY   Stiolto Respimat 2.5-2.5 MCG/ACT Aers Generic drug: Tiotropium Bromide-Olodaterol Inhale 2 puffs into the lungs daily.       Allergies:  Allergies  Allergen Reactions  . Pravastatin Other (See Comments)  . Prednisone Other (See Comments)    Pt states that med makes him hyper Pt states that med makes him hyper    Family History: Family History  Problem Relation Age of Onset  . Alcohol abuse Father     Social History:  reports that he quit smoking about 48 years ago. His smoking  use included cigarettes, pipe, and cigars. He has a 10.00 pack-year smoking history. He quit smokeless tobacco use about 48 years ago.  His smokeless tobacco use included chew. He reports current alcohol use of about 4.0 standard drinks of alcohol per week. He reports that he does not use drugs.   Physical Exam: BP 132/80   Pulse 79   Ht 5\' 7"  (1.702 m)   Wt 208 lb (94.3 kg)   BMI 32.58 kg/m   Constitutional:  Alert, No acute distress. HEENT: [place] AT, moist mucus membranes.  Trachea midline, no masses. Cardiovascular: No clubbing, cyanosis, or edema. Respiratory: Normal respiratory effort, no increased work of breathing.   Laboratory Data:  Urinalysis Dipstick trace glucose/trace leukocytes Microscopy negative   Assessment & Plan:    1.  Lower urinary tract symptoms  New problem  Severe LUTS most likely secondary to BPH  Bladder scan PVR 56 mL  Trial silodosin 8 mg daily, Rx sent to pharmacy  1 month follow-up for symptom reassessment   Abbie Sons, MD  [place] [address], [place], [place] [address] [phone]

## 2020-07-10 ENCOUNTER — Telehealth: Payer: Self-pay | Admitting: Cardiovascular Disease

## 2020-07-10 LAB — URINALYSIS, COMPLETE
Bilirubin, UA: NEGATIVE
Ketones, UA: NEGATIVE
Nitrite, UA: NEGATIVE
Protein,UA: NEGATIVE
RBC, UA: NEGATIVE
Specific Gravity, UA: 1.01 (ref 1.005–1.030)
Urobilinogen, Ur: 0.2 mg/dL (ref 0.2–1.0)
pH, UA: 6 (ref 5.0–7.5)

## 2020-07-10 LAB — MICROSCOPIC EXAMINATION
Bacteria, UA: NONE SEEN
Epithelial Cells (non renal): NONE SEEN /hpf (ref 0–10)

## 2020-07-10 NOTE — Telephone Encounter (Signed)
Pt c/o medication issue:  1. Name of Medication: Anoro  2. How are you currently taking this medication (dosage and times per day)?   62.5-25 inh 1 puff   3. Are you having a reaction (difficulty breathing--STAT)? No   4. What is your medication issue? New med from pulmonology wants the ok to start

## 2020-07-10 NOTE — Telephone Encounter (Signed)
That should be fine from our standpoint.

## 2020-07-10 NOTE — Telephone Encounter (Signed)
Routed to [place] to advise.

## 2020-07-10 NOTE — Telephone Encounter (Signed)
DPR on file. lmom with Dr. Tyrell Antonio response. Patient is to contact the office if any additional questions.

## 2020-07-13 ENCOUNTER — Other Ambulatory Visit: Payer: Self-pay | Admitting: Cardiovascular Disease

## 2020-07-13 DIAGNOSIS — M47812 Spondylosis without myelopathy or radiculopathy, cervical region: Secondary | ICD-10-CM | POA: Diagnosis not present

## 2020-07-13 NOTE — Telephone Encounter (Signed)
Rx request sent to pharmacy.  

## 2020-07-14 ENCOUNTER — Telehealth: Payer: Self-pay | Admitting: Cardiovascular Disease

## 2020-07-14 NOTE — Telephone Encounter (Signed)
   [facility] Pre-operative Risk Assessment    HEARTCARE STAFF: - Please ensure there is not already an duplicate clearance open for this procedure. - Under Visit Info/Reason for Call, type in Other and utilize the format Clearance MM/DD/YY or Clearance TBD. Do not use dashes or single digits. - If request is for dental extraction, please clarify the # of teeth to be extracted.  Request for surgical clearance:  1. What type of surgery is being performed? Repeat RFA to the left c3-4 and C4-5 facets  2. When is this surgery scheduled? TBD   3. What type of clearance is required (medical clearance vs. Pharmacy clearance to hold med vs. Both)? both  4. Are there any medications that need to be held prior to surgery and how long? Hold Eliquis x3 doses  5. Practice name and name of physician performing surgery? [place], DO  6. What is the office phone number? [phone]   7.   What is the office fax number? [phone]  8.   Anesthesia type (None, local, MAC, general) ? Not listed    Ace Gins 07/14/2020, 3:44 PM  _________________________________________________________________   (provider comments below)

## 2020-07-14 NOTE — Telephone Encounter (Signed)
   Primary Cardiologist: [name], MD  Chart reviewed as part of pre-operative protocol coverage. Given past medical history and time since last visit, based on ACC/AHA guidelines, EURAL HOLZSCHUH would be at acceptable but higher risk for the planned procedure given advanced age and CV history.   The patient was recently seen in follow up with Dr. Fletcher Anon 06/25/20 and was noted to be doing reasonably well from a CV standpoint.   Patient with diagnosis of afib on Eliquis for anticoagulation.    Procedure: Repeat RFA to the left c3-4 and C4-5 facets Date of procedure: TBD  CHA2DS2-VASc Score = 5  This indicates a 7.2% annual risk of stroke. The patient's score is based upon: CHF History: Yes HTN History: Yes Diabetes History: No Stroke History: No Vascular Disease History: Yes Age Score: 2 Gender Score: 0   Of note, patient also has a history of PE in 2011. He was previously cleared to hold Eliquis for 3 days in 09/2018 for the same procedure.   CrCl 70 mL/min using adjusted body weight Platelet count 124K  Per office protocol, patient can hold Eliquis for 3 days prior to procedure.    Patient should restart Eliquis on the evening of procedure or day after, at discretion of procedure MD.  The patient was advised that if he develops new symptoms prior to surgery to contact our office to arrange for a follow-up visit, and he verbalized understanding.  I will route this recommendation to the requesting party via Epic fax function and remove from pre-op pool.  Please call with questions.  Kathyrn Drown, NP 07/14/2020, 4:37 PM

## 2020-07-14 NOTE — Telephone Encounter (Signed)
Patient with diagnosis of afib on Eliquis for anticoagulation.    Procedure: Repeat RFA to the left c3-4 and C4-5 facets Date of procedure: TBD  CHA2DS2-VASc Score = 5  This indicates a 7.2% annual risk of stroke. The patient's score is based upon: CHF History: Yes HTN History: Yes Diabetes History: No Stroke History: No Vascular Disease History: Yes Age Score: 2 Gender Score: 0   Of note, patient also has a history of PE in 2011. He was previously cleared to hold Eliquis for 3 days in 09/2018 for the same procedure.   CrCl 70 mL/min using adjusted body weight Platelet count 124K  Per office protocol, patient can hold Eliquis for 3 days prior to procedure.    Patient should restart Eliquis on the evening of procedure or day after, at discretion of procedure MD.

## 2020-07-16 ENCOUNTER — Other Ambulatory Visit: Payer: Self-pay | Admitting: Neurology

## 2020-07-21 ENCOUNTER — Other Ambulatory Visit
Admission: RE | Admit: 2020-07-21 | Discharge: 2020-07-21 | Disposition: A | Discharge status: Home / Self Care | Payer: HMO | Source: Ambulatory Visit | Attending: Pulmonary Disease | Admitting: Pulmonary Disease

## 2020-07-21 ENCOUNTER — Other Ambulatory Visit: Payer: Self-pay

## 2020-07-21 DIAGNOSIS — Z01812 Encounter for preprocedural laboratory examination: Secondary | ICD-10-CM | POA: Diagnosis not present

## 2020-07-21 DIAGNOSIS — Z20822 Contact with and (suspected) exposure to covid-19: Secondary | ICD-10-CM | POA: Insufficient documentation

## 2020-07-22 LAB — SARS CORONAVIRUS 2 (TAT 6-24 HRS): SARS Coronavirus 2: NEGATIVE

## 2020-07-24 ENCOUNTER — Other Ambulatory Visit: Payer: Self-pay | Admitting: Internal Medicine

## 2020-07-24 ENCOUNTER — Ambulatory Visit: Payer: HMO | Admitting: Pulmonary Disease

## 2020-07-24 ENCOUNTER — Ambulatory Visit (INDEPENDENT_AMBULATORY_CARE_PROVIDER_SITE_OTHER): Payer: HMO | Admitting: Pulmonary Disease

## 2020-07-24 ENCOUNTER — Encounter: Payer: Self-pay | Admitting: Pulmonary Disease

## 2020-07-24 ENCOUNTER — Other Ambulatory Visit: Payer: Self-pay | Admitting: Pulmonary Disease

## 2020-07-24 ENCOUNTER — Other Ambulatory Visit: Payer: Self-pay

## 2020-07-24 VITALS — BP 138/72 | HR 60 | Temp 97.4°F | Ht 67.0 in | Wt 214.6 lb

## 2020-07-24 DIAGNOSIS — G4719 Other hypersomnia: Secondary | ICD-10-CM | POA: Diagnosis not present

## 2020-07-24 DIAGNOSIS — J449 Chronic obstructive pulmonary disease, unspecified: Secondary | ICD-10-CM

## 2020-07-24 DIAGNOSIS — J8410 Pulmonary fibrosis, unspecified: Secondary | ICD-10-CM

## 2020-07-24 DIAGNOSIS — G4733 Obstructive sleep apnea (adult) (pediatric): Secondary | ICD-10-CM | POA: Diagnosis not present

## 2020-07-24 LAB — PULMONARY FUNCTION TEST
DL/VA % pred: 149 %
DL/VA: 5.86 ml/min/mmHg/L
DLCO cor % pred: 73 %
DLCO cor: 16.11 ml/min/mmHg
DLCO unc % pred: 73 %
DLCO unc: 16.11 ml/min/mmHg
FEF 25-75 Pre: 1.01 L/sec
FEF2575-%Pred-Pre: 63 %
FEV1-%Pred-Pre: 67 %
FEV1-Pre: 1.64 L
FEV1FVC-%Pred-Pre: 100 %
FEV6-%Pred-Pre: 72 %
FEV6-Pre: 2.3 L
FEV6FVC-%Pred-Pre: 108 %
FVC-%Pred-Pre: 66 %
FVC-Pre: 2.3 L
Pre FEV1/FVC ratio: 71 %
Pre FEV6/FVC Ratio: 100 %

## 2020-07-24 NOTE — Progress Notes (Signed)
Spirometry and Dlco done today. 

## 2020-07-24 NOTE — Progress Notes (Signed)
Andre [name]    [id number]    01-11-1936  Primary Care Physician:Sonnenberg, Angela Adam, MD  Referring Physician: [name], MD [address] [place],  [place] [address]  Chief complaint: Follow-up for  COPD, asbestos related pulmonary fibrosis, sleep apnea. On Ofev from February to December 2632  HPI: 85 year old with history of pulmonary fibrosis, recurrent respiratory failure, eosinophilic pleural effusion, chronic diastolic heart failure, sleep apnea, Parkinson's, paroxysmal atrial fibrillation on Eliquis  Previously followed at [facility] pulmonary and [facility]. Started on Ofev February 2021 for progressive pulmonary fibrosis but had to be stopped in December 2021 due to side effects  Initially on Anoro.  This was changed to [facility] in May 2020 as he did not achieve adequate inspiratory flow.  He has had at least 4 admissions in 2019-2020 for recurrent respiratory failure, HCAP, right pleural effusion status post thoracentesis on 09/19/2017 with cell count showing exudate with elevated eosinophils.   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  Has run 35 marathons in the past.  After his CABG he took up long distance cycling which he had to give up about around 2010 years ago.  Pets: No pets Occupation: Worked as a Actor ILD questionnaire 05/15/2018: Exposure to asbestos, no other significant exposure. Smoking history:20-pack-year smoker.  Quit smoking in 1985 Travel history: No significant recent travel Relevant family history: No family history of lung disease.  Interim History: Remains off Ofev States that breathing is stable.  Continues to have significant daytime somnolence in spite of CPAP therapy.  Outpatient Encounter Medications as of 07/24/2020  Medication Sig  . albuterol (VENTOLIN HFA) 108 (90 Base) MCG/ACT inhaler Inhale 2 puffs into the lungs every 6 (six) hours as needed for wheezing or shortness of breath.  [name]  apixaban (ELIQUIS) 5 MG TABS tablet Take 5 mg by mouth 2 (two) times daily.  [name] buPROPion (WELLBUTRIN XL) 300 MG 24 hr tablet Take 1 tablet (300 mg total) by mouth daily.  . carbidopa-levodopa (SINEMET IR) 25-100 MG tablet Take 1 tablet by mouth 3 (three) times daily.  . carbidopa-levodopa (SINEMET IR) 25-250 MG tablet Take 1 tablet by mouth 3 (three) times daily.  . carvedilol (COREG) 3.125 MG tablet TAKE TWO TABLETS TWICE A DAY WITH MEALS  . entacapone (COMTAN) 200 MG tablet TAKE 1 TABLET BY MOUTH 3 TIMES DAILY  . esomeprazole (NEXIUM) 40 MG capsule TAKE 1 CAPSULE BY MOUTH ONCE DAILY  . feeding supplement (ENSURE ENLIVE / ENSURE PLUS) LIQD Take 237 mLs by mouth 3 (three) times daily between meals.  . fluticasone (FLONASE) 50 MCG/ACT nasal spray USE 2 PUFFS IN EACH NOSTRIL DAILY  . furosemide (LASIX) 20 MG tablet Take 1 tablet (20 mg total) by mouth daily.  . hydrocortisone 2.5 % lotion Apply topically daily. Apply to affected areas 4 times per week.  [name] ketoconazole (NIZORAL) 2 % cream Apply 1 application topically daily as needed for irritation.  [name] ketoconazole (NIZORAL) 2 % shampoo Apply 1 application topically 3 (three) times a week. Wash scalp, face and ears  . ketorolac (ACULAR) 0.5 % ophthalmic solution SMARTSIG:1 Drop(s) In Eye(s) Every 4-6 Hours PRN  . lactulose (CHRONULAC) 10 GM/15ML solution TAKE 30 MLS BY MOUTH TWICE DAILY AS NEEDED FOR MODERATE CONSTIPATION  . levothyroxine (SYNTHROID) 50 MCG tablet Take 50 mcg by mouth daily before breakfast.  . losartan (COZAAR) 100 MG tablet Take 100 mg by mouth daily.  [name]  mometasone (ELOCON) 0.1 % cream   . mupirocin ointment (BACTROBAN) 2 % Apply 1 application topically 3 (three) times daily as needed.  . potassium chloride SA (KLOR-CON) 10 MEQ tablet Take 1 tablet (10 mEq total) by mouth daily.  [name] PROAIR HFA 108 (90 Base) MCG/ACT inhaler INHALE 2 PUFFS INTO THE LUNGS EVERY SIX HOURS AS NEEDED FOR WHEEZING OR SHORTNESS OF BREATH  . QUEtiapine  (SEROQUEL) 25 MG tablet 1/2 tablet in the afternoon, 1 tablet at night  . Respiratory Therapy Supplies (FLUTTER) DEVI 1 Device by Does not apply route daily.  . rosuvastatin (CRESTOR) 10 MG tablet TAKE 1 TABLET BY MOUTH DAILY  . sertraline (ZOLOFT) 50 MG tablet TAKE 1 TABLET BY MOUTH DAILY  . silodosin (RAPAFLO) 8 MG CAPS capsule Take 1 capsule (8 mg total) by mouth daily with breakfast.  . Tiotropium Bromide-Olodaterol (STIOLTO RESPIMAT) 2.5-2.5 MCG/ACT AERS Inhale 2 puffs into the lungs daily.  [name] umeclidinium-vilanterol (ANORO ELLIPTA) 62.5-25 MCG/INH AEPB Inhale 1 puff into the lungs daily.  . [DISCONTINUED] ciprofloxacin (CIPRO) 500 MG tablet Take 1 tablet (500 mg total) by mouth 2 (two) times daily. With food   No facility-administered encounter medications on file as of 07/24/2020.   Physical Exam: Blood pressure 134/74, pulse 89, temperature (!) 97.3 F (36.3 C), temperature source Temporal, height 5\' 7"  (1.702 m), weight 217 lb 6.4 oz (98.6 kg), SpO2 97 %. Gen:      No acute distress HEENT:  EOMI, sclera anicteric Neck:     No masses; no thyromegaly Lungs:    Clear to auscultation bilaterally; normal respiratory effort CV:         Regular rate and rhythm; no murmurs Abd:      + bowel sounds; soft, non-tender; no palpable masses, no distension Ext:    No edema; adequate peripheral perfusion Skin:      Warm and dry; no rash Neuro: alert and oriented x 3 Psych: normal mood and affect  Data Reviewed: Imaging: CT chest 12/06/2011- peripheral and basal fibrotic changes, calcified pleural plaques.  CT chest 06/09/2015- peripheral and basal fibrotic changes with no honeycombing.  Calcified pleural plaques  CT high-resolution 05/30/2017- moderate centrilobular emphysema, mild basilar subpleural reticulation, groundglass and traction bronchiectasis.  No honeycombing.  Calcified granuloma in the right upper lobe, calcified pleural plaques.  CT high-resolution 06/08/2018- moderate emphysema,  mild basilar reticulation, traction bronchiectasis.  No honeycombing, calcified pleural plaques with right pleural thickening.  Stable calcified granuloma.  CT high-resolution 10/23/2019-mild progression of pulmonary fibrosis.  I have reviewed the images personally.  PFTs: 09/25/2012 FVC 2.52 [69%], FEV1 1.95 [75%), F/F 77, TLC 72%, DLCO 70% Moderate obstructive airways, minimal restriction and diffusion defect.  06/12/2018 FVC 2.38 [16%], FEV1 1.66 [66%], F/F 70, TLC 3.95 (61%), DLCO 16.34 [73%] Moderate obstruction with restriction and minimal diffusion defect  07/24/20 FVC 2.30 [6%], FEV1 1.64 [69%], F/F 71, DLCO 16.11 [73%] Moderate restriction with mild diffusion defect  Labs: ANA 01/22/2018- negative, CCP-negative  Pleural fluid 09/19/2017- LDH 462, total protein 3.9 WBC 3661, 59% eos, 27% lymphs, 10% neutrophils Cytology-mixed reactive and inflammatory cells.  No malignancy.  Hepatic panel 2/22-stable  Cardiac Echocardiogram 08/28/2017- LVEF 24-26%, grade 2 diastolic dysfunction, mild aortic stenosis.  Severe dilatation of left atrium and right atrium, trivial pericardial effusion.  Cardiac catheterization 09/18/2017 Significant three-vessel coronary artery disease with patent grafts.  Right heart catheterization shows mildly elevated filling pressures, minimal pulmonary hypertension Wedge 12, PA pressure 39/17 (24), cardiac output 5.08 L/min PVR 189,  2.4 Wood units  Sleep CPAP titration 11/04/2015- CPAP titrated to 12 cm of water.  Download 07/23/2020 97% use greater than 4 hours, set pressure 12 cm, AHI 1.9  Assessment:  Pulmonary fibrosis, asbestosis CT scan reviewed with basilar fibrosis, pleural calcifications consistent with asbestos exposure Due to progression of pulmonary fibrosis he has been initiated on Ofev. But had to stop due to GI side effects  We will give him a break from medication and reassess with CT in 3 months He may need to go on alternate medication such  as Esbriet.  Dyspnea is multifactorial from ILD, obesity, diastolic heart failure, coronary artery disease Suspect anxiety is a significant component to his episodes of dyspnea Will benefit from pulmonary rehab but he will like to hold off until the Covid pandemic has improved  Emphysema COPD Continue inhalers Continue flutter valve for mucociliary clearance  Right pleural effusion exudative, eosinophilic S.p thoracentesis dring hospitalization in 2019.  The cell count is eosinophilic which could be from pneumonia versus asbestos-related effusion Follow-up CT shows thickening of the pleura in that area We will need to monitor this area for possible mesothelioma.   Obstructive sleep apnea, daytime somnolence Stable on CPAP Download reviewed with good compliance and effectiveness.   But he continues to have significant daytime somnolence.  Not sure if this is an effect of his Parkinson's We will refer to sleep clinic for evaluation  Health maintenance 12/11/2018 -influenza 05/10/2016-Prevnar  Up-to-date with flu and COVID-19.  This appointment required 45 minutes of patient care (this includes precharting, chart review, review of results, face-to-face care, etc.).  Plan/Recommendations: Stiolto, flutter valve Sleep clinic referral for daytime somnolence on CPAP Follow-up high-res CT in 3 months  Marshell Garfinkel MD [facility] 07/24/2020, 1:37 PM  CC: [name], MD

## 2020-07-24 NOTE — Patient Instructions (Signed)
We will refer you to see Dr. Halford Chessman in [place] for management of daytime somnolence, sleep apnea We will get a high-res CT in 3 months at [facility] regional medical Follow-up here in clinic after CT scan.

## 2020-07-27 ENCOUNTER — Ambulatory Visit (INDEPENDENT_AMBULATORY_CARE_PROVIDER_SITE_OTHER): Payer: HMO

## 2020-07-27 ENCOUNTER — Ambulatory Visit: Payer: HMO | Admitting: Dermatology

## 2020-07-27 ENCOUNTER — Other Ambulatory Visit: Payer: Self-pay

## 2020-07-27 DIAGNOSIS — E538 Deficiency of other specified B group vitamins: Secondary | ICD-10-CM | POA: Diagnosis not present

## 2020-07-27 DIAGNOSIS — D485 Neoplasm of uncertain behavior of skin: Secondary | ICD-10-CM

## 2020-07-27 DIAGNOSIS — Z85828 Personal history of other malignant neoplasm of skin: Secondary | ICD-10-CM

## 2020-07-27 DIAGNOSIS — C44622 Squamous cell carcinoma of skin of right upper limb, including shoulder: Secondary | ICD-10-CM | POA: Diagnosis not present

## 2020-07-27 HISTORY — DX: Personal history of other malignant neoplasm of skin: Z85.828

## 2020-07-27 MED ORDER — CYANOCOBALAMIN 1000 MCG/ML IJ SOLN
1000.0000 ug | Freq: Once | INTRAMUSCULAR | Status: AC
  Administered 2020-07-27: 1000 ug via INTRAMUSCULAR

## 2020-07-27 MED ORDER — MUPIROCIN 2 % EX OINT: 1.0000 "application " | TOPICAL_OINTMENT | Freq: Every day | CUTANEOUS | 0 refills | Status: DC

## 2020-07-27 NOTE — Progress Notes (Addendum)
   Follow-Up Visit   Subjective  Andre [name] is a 85 y.o. male who presents for the following: lesion (On the right arm x 2 weeks - itchy, irregular ).  The following portions of the chart were reviewed this encounter and updated as appropriate:   Tobacco  Allergies  Meds  Problems  Med Hx  Surg Hx  Fam Hx      Review of Systems:  No other skin or systemic complaints except as noted in HPI or Assessment and Plan.  Objective  Well appearing patient in no apparent distress; mood and affect are within normal limits.  A focused examination was performed including right arm. Relevant physical exam findings are noted in the Assessment and Plan.  Objective  R forearm: 1.2 cm pink nodule   Assessment & Plan  Neoplasm of uncertain behavior of skin R forearm  Epidermal / dermal shaving  Lesion diameter (cm):  1.2 Informed consent: discussed and consent obtained   Timeout: patient name, date of birth, surgical site, and procedure verified   Procedure prep:  Patient was prepped and draped in usual sterile fashion Prep type:  Isopropyl alcohol Anesthesia: the lesion was anesthetized in a standard fashion   Anesthetic:  1% lidocaine w/ epinephrine 1-100,000 buffered w/ 8.4% NaHCO3 Instrument used: flexible razor blade   Hemostasis achieved with: pressure, aluminum chloride and electrodesiccation   Outcome: patient tolerated procedure well   Post-procedure details: sterile dressing applied and wound care instructions given   Dressing type: bandage and petrolatum    Destruction of lesion Complexity: extensive   Destruction method: electrodesiccation and curettage   Informed consent: discussed and consent obtained   Timeout:  patient name, date of birth, surgical site, and procedure verified Procedure prep:  Patient was prepped and draped in usual sterile fashion Prep type:  Isopropyl alcohol Anesthesia: the lesion was anesthetized in a standard fashion   Anesthetic:  1%  lidocaine w/ epinephrine 1-100,000 buffered w/ 8.4% NaHCO3 Curettage performed in three different directions: Yes   Electrodesiccation performed over the curetted area: Yes   Final wound size (cm):  2 Hemostasis achieved with:  pressure, aluminum chloride and electrodesiccation Outcome: patient tolerated procedure well with no complications   Post-procedure details: sterile dressing applied and wound care instructions given   Dressing type: bandage and petrolatum    mupirocin ointment (BACTROBAN) 2 %  Specimen 1 - Surgical pathology Differential Diagnosis: D48.5 r/o SCC vs other  ED&C today  Check Margins: No 1.2 cm pink nodule  Start Mupirocin 2% ointment to aa QD with dressing change.   Return for appointment as scheduled.  [name], CMA, am acting as scribe for Forest Gleason, MD .  Documentation: I have reviewed the above documentation for accuracy and completeness, and I agree with the above.  Forest Gleason, MD

## 2020-07-27 NOTE — Patient Instructions (Addendum)
If you have any questions or concerns for your doctor, please call our main line at [phone] and press option 4 to reach your doctor's medical assistant. If no one answers, please leave a voicemail as directed and we will return your call as soon as possible. Messages left after 4 pm will be answered the following business day.   You may also send [place] a message via [place]. We typically respond to MyChart messages within 1-2 business days.  For prescription refills, please ask your pharmacy to contact our office. Our fax number is [phone].  If you have an urgent issue when the clinic is closed that cannot wait until the next business day, you can page your doctor at the number below.    Please note that while we do our best to be available for urgent issues outside of office hours, we are not available 24/7.   If you have an urgent issue and are unable to reach [place], you may choose to seek medical care at your doctor's office, retail clinic, urgent care center, or emergency room.  If you have a medical emergency, please immediately call 911 or go to the emergency department.  Pager Numbers  - Dr. Nehemiah Massed: [phone]  - Dr. Laurence Ferrari: [phone]  - Dr. Nicole Kindred: [phone]  In the event of inclement weather, please call our main line at [phone] for an update on the status of any delays or closures.  Dermatology Medication Tips: Please keep the boxes that topical medications come in in order to help keep track of the instructions about where and how to use these. Pharmacies typically print the medication instructions only on the boxes and not directly on the medication tubes.   If your medication is too expensive, please contact our office at [phone] option 4 or send [place] a message through [place].   We are unable to tell what your co-pay for medications will be in advance as this is different depending on your insurance coverage. However, we may be able to find a substitute  medication at lower cost or fill out paperwork to get insurance to cover a needed medication.   If a prior authorization is required to get your medication covered by your insurance company, please allow [place] 1-2 business days to complete this process.  Drug prices often vary depending on where the prescription is filled and some pharmacies may offer cheaper prices.  The website www.goodrx.com contains coupons for medications through different pharmacies. The prices here do not account for what the cost may be with help from insurance (it may be cheaper with your insurance), but the website can give you the price if you did not use any insurance.  - You can print the associated coupon and take it with your prescription to the pharmacy.  - You may also stop by our office during regular business hours and pick up a GoodRx coupon card.  - If you need your prescription sent electronically to a different pharmacy, notify our office through [facility] or by phone at [phone] option 4.  Electrodesiccation and Curettage ("Scrape and Burn") Wound Care Instructions  1. Leave the original bandage on for 24 hours if possible.  If the bandage becomes soaked or soiled before that time, it is OK to remove it and examine the wound.  A small amount of post-operative bleeding is normal.  If excessive bleeding occurs, remove the bandage, place gauze over the site and apply continuous pressure (no peeking) over the area for 30  minutes. If this does not work, please call our clinic as soon as possible or page your doctor if it is after hours.   2. Once a day, cleanse the wound with soap and water. It is fine to shower. If a thick crust develops you may use a Q-tip dipped into dilute hydrogen peroxide (mix 1:1 with water) to dissolve it.  Hydrogen peroxide can slow the healing process, so use it only as needed.    3. After washing, apply petroleum jelly (Vaseline) or an antibiotic ointment if your doctor  prescribed one for you, followed by a bandage.    4. For best healing, the wound should be covered with a layer of ointment at all times. If you are not able to keep the area covered with a bandage to hold the ointment in place, this may mean re-applying the ointment several times a day.  Continue this wound care until the wound has healed and is no longer open. It may take several weeks for the wound to heal and close.  Itching and mild discomfort is normal during the healing process.  If you have any discomfort, you can take Tylenol (acetaminophen) or ibuprofen as directed on the bottle. (Please do not take these if you have an allergy to them or cannot take them for another reason).  Some redness, tenderness and white or yellow material in the wound is normal healing.  If the area becomes very sore and red, or develops a thick yellow-green material (pus), it may be infected; please notify us.    Wound healing continues for up to one year following surgery. It is not unusual to experience pain in the scar from time to time during the interval.  If the pain becomes severe or the scar thickens, you should notify the office.    A slight amount of redness in a scar is expected for the first six months.  After six months, the redness will fade and the scar will soften and fade.  The color difference becomes less noticeable with time.  If there are any problems, return for a post-op surgery check at your earliest convenience.  To improve the appearance of the scar, you can use silicone scar gel, cream, or sheets (such as Mederma or Serica) every night for up to one year. These are available over the counter (without a prescription).  Please call our office at [phone] for any questions or concerns.

## 2020-07-27 NOTE — Progress Notes (Signed)
Patient presented for B 12 injection to left deltoid, patient voiced no concerns nor showed any signs of distress during injection. 

## 2020-07-29 ENCOUNTER — Telehealth: Payer: Self-pay

## 2020-07-29 ENCOUNTER — Encounter: Payer: Self-pay | Admitting: Dermatology

## 2020-07-29 ENCOUNTER — Telehealth: Payer: Self-pay | Admitting: Pulmonary Disease

## 2020-07-29 ENCOUNTER — Other Ambulatory Visit: Payer: Self-pay | Admitting: Pulmonary Disease

## 2020-07-29 DIAGNOSIS — J441 Chronic obstructive pulmonary disease with (acute) exacerbation: Secondary | ICD-10-CM

## 2020-07-29 MED ORDER — STIOLTO RESPIMAT 2.5-2.5 MCG/ACT IN AERS: 2.0000 | INHALATION_SPRAY | Freq: Every day | RESPIRATORY_TRACT | 5 refills | Status: DC

## 2020-07-29 NOTE — Telephone Encounter (Signed)
I called and spoke with patient's wife, who is on DPR, regarding med list. They were confused as to which inhaler patient should be taking since Anoro and Stiolto was on there. I went back to Dr.Mannam last OV note and it stated Anoro was stopped in May 2020 and started on Stiolto, 2 puffs daily as well as albuterol rescue when needed. Patient was out of [place] so I went ahead and sent in script for Stiolto to preferred pharmacy. Patient is good on albuterol refills. Patient and wife verbalized understanding and was told to call with any other questions/concerns. Nothing further needed.

## 2020-07-29 NOTE — Telephone Encounter (Signed)
-----   Message from Alfonso Patten, MD sent at 07/29/2020 10:41 AM EDT ----- Skin , right forearm, shave WELL DIFFERENTIATED SQUAMOUS CELL CARCINOMA --> already treated with ED&C. Keep scheduled follow-up with Dr. Nehemiah Massed  MAs please call. Thank you!

## 2020-07-29 NOTE — Telephone Encounter (Signed)
Patient advised bx showed SCC, already treated and to keep follow up with Dr. Raliegh Ip, Cherly Hensen

## 2020-08-05 DIAGNOSIS — M47812 Spondylosis without myelopathy or radiculopathy, cervical region: Secondary | ICD-10-CM | POA: Diagnosis not present

## 2020-08-07 ENCOUNTER — Encounter: Payer: Self-pay | Admitting: Urology

## 2020-08-07 ENCOUNTER — Ambulatory Visit (INDEPENDENT_AMBULATORY_CARE_PROVIDER_SITE_OTHER): Payer: HMO | Admitting: Urology

## 2020-08-07 ENCOUNTER — Other Ambulatory Visit: Payer: Self-pay

## 2020-08-07 VITALS — BP 140/87 | HR 76 | Wt 212.0 lb

## 2020-08-07 DIAGNOSIS — N401 Enlarged prostate with lower urinary tract symptoms: Secondary | ICD-10-CM

## 2020-08-07 DIAGNOSIS — R350 Frequency of micturition: Secondary | ICD-10-CM | POA: Diagnosis not present

## 2020-08-07 DIAGNOSIS — N3941 Urge incontinence: Secondary | ICD-10-CM

## 2020-08-07 LAB — BLADDER SCAN AMB NON-IMAGING: Scan Result: 9

## 2020-08-07 MED ORDER — GEMTESA 75 MG PO TABS: 75.0000 mg | ORAL_TABLET | Freq: Every day | ORAL | 0 refills | Status: DC

## 2020-08-07 NOTE — Patient Instructions (Signed)
Cystoscopy Cystoscopy is a procedure that is used to help diagnose and sometimes treat conditions that affect the lower urinary tract. The lower urinary tract includes the bladder and the urethra. The urethra is the tube that drains urine from the bladder. Cystoscopy is done using a thin, tube-shaped instrument with a light and camera at the end (cystoscope). The cystoscope may be hard or flexible, depending on the goal of the procedure. The cystoscope is inserted through the urethra, into the bladder. Cystoscopy may be recommended if you have:  Urinary tract infections that keep coming back.  Blood in the urine (hematuria).  An inability to control when you urinate (urinary incontinence) or an overactive bladder.  Unusual cells found in a urine sample.  A blockage in the urethra, such as a urinary stone.  Painful urination.  An abnormality in the bladder found during an intravenous pyelogram (IVP) or CT scan. Cystoscopy may also be done to remove a sample of tissue to be examined under a microscope (biopsy). What are the risks? Generally, this is a safe procedure. However, problems may occur, including:  Infection.  Bleeding.  What happens during the procedure?  1. You will be given one or more of the following: ? A medicine to numb the area (local anesthetic). 2. The area around the opening of your urethra will be cleaned. 3. The cystoscope will be passed through your urethra into your bladder. 4. Germ-free (sterile) fluid will flow through the cystoscope to fill your bladder. The fluid will stretch your bladder so that your health care provider can clearly examine your bladder walls. 5. Your doctor will look at the urethra and bladder. 6. The cystoscope will be removed The procedure may vary among health care providers  What can I expect after the procedure? After the procedure, it is common to have: 1. Some soreness or pain in your abdomen and urethra. 2. Urinary symptoms.  These include: ? Mild pain or burning when you urinate. Pain should stop within a few minutes after you urinate. This may last for up to 1 week. ? A small amount of blood in your urine for several days. ? Feeling like you need to urinate but producing only a small amount of urine. Follow these instructions at home: General instructions  Return to your normal activities as told by your health care provider.   Do not drive for 24 hours if you were given a sedative during your procedure.  Watch for any blood in your urine. If the amount of blood in your urine increases, call your health care provider.  If a tissue sample was removed for testing (biopsy) during your procedure, it is up to you to get your test results. Ask your health care provider, or the department that is doing the test, when your results will be ready.  Drink enough fluid to keep your urine pale yellow.  Keep all follow-up visits as told by your health care provider. This is important. Contact a health care provider if you:  Have pain that gets worse or does not get better with medicine, especially pain when you urinate.  Have trouble urinating.  Have more blood in your urine. Get help right away if you:  Have blood clots in your urine.  Have abdominal pain.  Have a fever or chills.  Are unable to urinate. Summary  Cystoscopy is a procedure that is used to help diagnose and sometimes treat conditions that affect the lower urinary tract.  Cystoscopy is done using   a thin, tube-shaped instrument with a light and camera at the end.  After the procedure, it is common to have some soreness or pain in your abdomen and urethra.  Watch for any blood in your urine. If the amount of blood in your urine increases, call your health care provider.  If you were prescribed an antibiotic medicine, take it as told by your health care provider. Do not stop taking the antibiotic even if you start to feel better. This  information is not intended to replace advice given to you by your health care provider. Make sure you discuss any questions you have with your health care provider. Document Revised: 03/27/2018 Document Reviewed: 03/27/2018 Elsevier Patient Education  2020 Elsevier Inc.   

## 2020-08-07 NOTE — Progress Notes (Signed)
08/07/2020 3:49 PM   Andre [name] 1936-02-27 [id number]  Referring provider: [name], MD [address] [place],  [place] [address]  Chief Complaint  Patient presents with  . Urinary Frequency    HPI: 85 y.o. male presents for 1 month follow-up.   Initially seen 07/09/2020 for severe lower urinary tract symptoms  IPSS 25/35  Given a trial of silodosin and presents for follow-up.  Has seen no significant change in his voiding pattern with most bothersome symptom urge with urge incontinence  Denies dysuria, gross hematuria   PMH: Past Medical History:  Diagnosis Date  . Atherosclerosis of abdominal aorta ([place])   . Basal cell carcinoma 03/04/2008   Right nose supratip.   [name] CAD (coronary artery disease)   . Cervical spondylosis 10/01/2013  . Chronic diastolic CHF (congestive heart failure) ([place])    a. 07/2016 Echo: >55%; b. 10/2016 Echo: EF 55-60%, Gr1 DD, Ao sclerosis w/o stenosis, sev dil LA; c. 08/2017 Echo: EF 60-65%, no rwma, Gr2 DD, mild AS, sev dil LA/RA.  [name] Coronary artery disease    a. 1998 s/p mini-cabg @ [facility] - LIMA->LAD;  b. 07/2016 St Echo: Inadequate HR w/ HTN response;  c.  08/2016 MV: EF 67%, no ischemia; d. 10/2016 NSTEMI/Cath: RCA 95p (4.0x26 Onyx DES), LIMA->LAD nl; e. 09/2017 Cath: LM 40/30, LAD 100ost, RI 80, LCX nl, OM2/3 nl, RCA patent stent, 74m, LIMA->LAD nl-->Med Rx.  . DDD (degenerative disc disease), cervical   . DDD (degenerative disc disease), lumbar   . Dementia with parkinsonism ([place]) 06/03/2020  . Depression   . Gait abnormality 07/31/2019  . GERD (gastroesophageal reflux disease)   . History of SCC (squamous cell carcinoma) of skin 07/27/2020   right forearm / EDC  . Hyperlipidemia   . Hypertension   . Hypothyroidism   . PAF (paroxysmal atrial fibrillation) (HCC)    a. s/p DCCV-->maintaining sinus on amiodarone;  b. CHA2DS2VASc = 5-->eliquis.  . Parkinson's disease ([place])    tremors  . Pleural effusion, right    a. 09/2017  s/p thoracentesis.  [name] PNA (pneumonia) 08/26/2017  . Pulmonary embolism ([place]) 2011  . Pulmonary fibrosis ([place])   . 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 Sleep apnea    wears CPAP  . Squamous cell carcinoma of skin 03/19/2015   Right lateral crown. KA-like pattern  . Thrombocytopenia [facility])     Surgical History: Past Surgical History:  Procedure Laterality Date  . BACK SURGERY  1960  . CARDIAC CATHETERIZATION    . CHOLECYSTECTOMY  2010  . COLONOSCOPY WITH PROPOFOL N/A 06/07/2018   Procedure: COLONOSCOPY WITH PROPOFOL;  Surgeon: Lollie Sails, MD;  Location: [facility] ENDOSCOPY;  Service: Endoscopy;  Laterality: N/A;  . CORONARY ARTERY BYPASS GRAFT  01/07/1997  . CORONARY STENT INTERVENTION N/A 10/31/2016   Procedure: Coronary Stent Intervention;  Surgeon: Wellington Hampshire, MD;  Location: [facility] CV LAB;  Service: Cardiovascular;  Laterality: N/A;  . ELECTROPHYSIOLOGIC STUDY N/A 07/14/2015   Procedure: CARDIOVERSION;  Surgeon: Yolonda Kida, MD;  Location: [facility] ORS;  Service: Cardiovascular;  Laterality: N/A;  . ELECTROPHYSIOLOGIC STUDY N/A 10/12/2015   Procedure: CARDIOVERSION;  Surgeon: Minna Merritts, MD;  Location: [facility] ORS;  Service: Cardiovascular;  Laterality: N/A;  . LEFT HEART CATH AND CORONARY ANGIOGRAPHY N/A 10/31/2016   Procedure: Left Heart Cath and Coronary Angiography;  Surgeon: Wellington Hampshire, MD;  Location: [facility] CV LAB;  Service: Cardiovascular;  Laterality: N/A;  . OTHER SURGICAL HISTORY  1998   Bypass  . RIGHT/LEFT HEART CATH AND CORONARY ANGIOGRAPHY N/A 09/18/2017   Procedure: RIGHT/LEFT HEART CATH AND CORONARY ANGIOGRAPHY;  Surgeon: Wellington Hampshire, MD;  Location: [facility] CV LAB;  Service: Cardiovascular;  Laterality: N/A;    Home Medications:  Allergies as of 08/07/2020      Reactions   Pravastatin Other (See Comments)   Prednisone Other (See Comments)   Pt states that med makes him hyper Pt states that med makes him hyper       Medication List       Accurate as of August 07, 2020  3:49 PM. If you have any questions, ask your nurse or doctor.        albuterol 108 (90 Base) MCG/ACT inhaler Commonly known as: VENTOLIN HFA Inhale 2 puffs into the lungs every 6 (six) hours as needed for wheezing or shortness of breath.   apixaban 5 MG Tabs tablet Commonly known as: ELIQUIS Take 5 mg by mouth 2 (two) times daily.   buPROPion 300 MG 24 hr tablet Commonly known as: Wellbutrin XL Take 1 tablet (300 mg total) by mouth daily.   carbidopa-levodopa 25-250 MG tablet Commonly known as: SINEMET IR Take 1 tablet by mouth 3 (three) times daily.   carbidopa-levodopa 25-100 MG tablet Commonly known as: SINEMET IR Take 1 tablet by mouth 3 (three) times daily.   carvedilol 3.125 MG tablet Commonly known as: COREG TAKE TWO TABLETS TWICE A DAY WITH MEALS   entacapone 200 MG tablet Commonly known as: COMTAN TAKE 1 TABLET BY MOUTH 3 TIMES DAILY   esomeprazole 40 MG capsule Commonly known as: NEXIUM TAKE 1 CAPSULE BY MOUTH ONCE DAILY   feeding supplement Liqd Take 237 mLs by mouth 3 (three) times daily between meals.   fluticasone 50 MCG/ACT nasal spray Commonly known as: FLONASE USE 2 PUFFS IN EACH NOSTRIL DAILY   Flutter Devi 1 Device by Does not apply route daily.   furosemide 20 MG tablet Commonly known as: LASIX Take 1 tablet (20 mg total) by mouth daily.   Gemtesa 75 MG Tabs Generic drug: Vibegron Take 75 mg by mouth daily. Started by: Abbie Sons, MD   hydrocortisone 2.5 % lotion Apply topically daily. Apply to affected areas 4 times per week.   ketoconazole 2 % cream Commonly known as: NIZORAL Apply 1 application topically daily as needed for irritation.   ketoconazole 2 % shampoo Commonly known as: NIZORAL Apply 1 application topically 3 (three) times a week. Wash scalp, face and ears   ketorolac 0.5 % ophthalmic solution Commonly known as: ACULAR SMARTSIG:1 Drop(s) In Eye(s) Every  4-6 Hours PRN   lactulose 10 GM/15ML solution Commonly known as: CHRONULAC TAKE 30 MLS BY MOUTH TWICE DAILY AS NEEDED FOR MODERATE CONSTIPATION   levothyroxine 50 MCG tablet Commonly known as: SYNTHROID Take 50 mcg by mouth daily before breakfast.   losartan 100 MG tablet Commonly known as: COZAAR Take 100 mg by mouth daily.   mometasone 0.1 % cream Commonly known as: ELOCON   mupirocin ointment 2 % Commonly known as: BACTROBAN Apply 1 application topically 3 (three) times daily as needed.   potassium chloride 10 MEQ tablet Commonly known as: KLOR-CON Take 1 tablet (10 mEq total) by mouth daily.   QUEtiapine 25 MG tablet Commonly known as: SEROquel 1/2 tablet in the afternoon, 1 tablet at night   rosuvastatin 10 MG tablet Commonly known as: CRESTOR TAKE 1 TABLET BY MOUTH DAILY   sertraline 50  MG tablet Commonly known as: ZOLOFT TAKE 1 TABLET BY MOUTH DAILY   silodosin 8 MG Caps capsule Commonly known as: RAPAFLO Take 1 capsule (8 mg total) by mouth daily with breakfast.   Stiolto Respimat 2.5-2.5 MCG/ACT Aers Generic drug: Tiotropium Bromide-Olodaterol Inhale 2 puffs into the lungs daily.       Allergies:  Allergies  Allergen Reactions  . Pravastatin Other (See Comments)  . Prednisone Other (See Comments)    Pt states that med makes him hyper Pt states that med makes him hyper    Family History: Family History  Problem Relation Age of Onset  . Alcohol abuse Father     Social History:  reports that he quit smoking about 48 years ago. His smoking use included cigarettes, pipe, and cigars. He has a 10.00 pack-year smoking history. He quit smokeless tobacco use about 48 years ago.  His smokeless tobacco use included chew. He reports current alcohol use of about 4.0 standard drinks of alcohol per week. He reports that he does not use drugs.   Physical Exam: BP 140/87   Pulse 76   Wt 212 lb (96.2 kg)   BMI 33.20 kg/m   Constitutional:  Alert and  oriented, No acute distress. HEENT: [place] AT, moist mucus membranes.  Trachea midline, no masses. Cardiovascular: No clubbing, cyanosis, or edema. Respiratory: Normal respiratory effort, no increased work of breathing.   Assessment & Plan:    1.  BPH with LUTS  Bladder scan PVR today improved at 9 mL  Continue silodosin  2.  Urge incontinence  Bothersome, no improvement with silodosin  Trial Gemtesa 75 mg daily-samples given  Follow-up 1 month for symptom recheck, repeat IPSS  Cystoscopy if no significant improvement in voiding pattern   Return in about 4 weeks (around 09/04/2020) for 66mo possible cysto.   Abbie Sons, [place] [address] [place], [place] [address] [phone]

## 2020-08-08 ENCOUNTER — Other Ambulatory Visit: Payer: Self-pay | Admitting: Family Medicine

## 2020-08-12 ENCOUNTER — Ambulatory Visit: Payer: Self-pay | Admitting: Urology

## 2020-08-13 ENCOUNTER — Ambulatory Visit: Payer: HMO | Admitting: Neurology

## 2020-08-18 ENCOUNTER — Other Ambulatory Visit: Payer: Self-pay

## 2020-08-18 ENCOUNTER — Ambulatory Visit: Payer: HMO | Admitting: Dermatology

## 2020-08-18 ENCOUNTER — Encounter: Payer: Self-pay | Admitting: Dermatology

## 2020-08-18 DIAGNOSIS — L820 Inflamed seborrheic keratosis: Secondary | ICD-10-CM | POA: Diagnosis not present

## 2020-08-18 DIAGNOSIS — L578 Other skin changes due to chronic exposure to nonionizing radiation: Secondary | ICD-10-CM

## 2020-08-18 DIAGNOSIS — L570 Actinic keratosis: Secondary | ICD-10-CM

## 2020-08-18 NOTE — Patient Instructions (Signed)

## 2020-08-18 NOTE — Progress Notes (Signed)
   Follow-Up Visit   Subjective  Andre [name] is a 85 y.o. male who presents for the following: Actinic Keratosis (Recheck the face, scalp, arms, and hands - check for new or persistent skin lesions).  He has other spots be checked today.  The following portions of the chart were reviewed this encounter and updated as appropriate:   Tobacco  Allergies  Meds  Problems  Med Hx  Surg Hx  Fam Hx     Review of Systems:  No other skin or systemic complaints except as noted in HPI or Assessment and Plan.  Objective  Well appearing patient in no apparent distress; mood and affect are within normal limits.  A focused examination was performed including the face, scalp, arms, and hands. Relevant physical exam findings are noted in the Assessment and Plan.  Objective  Face and scalp (20): Erythematous thin papules/macules with gritty scale.   Objective  Arms and hands x (8): Erythematous keratotic or waxy stuck-on papule or plaque.    Assessment & Plan  AK (actinic keratosis) (20) Face and scalp  Recheck the L inf cheek in particular at follow up visit.   Destruction of lesion - Face and scalp Complexity: simple   Destruction method: cryotherapy   Informed consent: discussed and consent obtained   Timeout:  patient name, date of birth, surgical site, and procedure verified Lesion destroyed using liquid nitrogen: Yes   Region frozen until ice ball extended beyond lesion: Yes   Outcome: patient tolerated procedure well with no complications   Post-procedure details: wound care instructions given    Inflamed seborrheic keratosis (8) Arms and hands x8  Destruction of lesion - Arms and hands x Complexity: simple   Destruction method: cryotherapy   Informed consent: discussed and consent obtained   Timeout:  patient name, date of birth, surgical site, and procedure verified Lesion destroyed using liquid nitrogen: Yes   Region frozen until ice ball extended beyond lesion: Yes    Outcome: patient tolerated procedure well with no complications   Post-procedure details: wound care instructions given     Actinic Damage - chronic, secondary to cumulative UV radiation exposure/sun exposure over time - diffuse scaly erythematous macules with underlying dyspigmentation - Recommend daily broad spectrum sunscreen SPF 30+ to sun-exposed areas, reapply every 2 hours as needed.  - Recommend staying in the shade or wearing long sleeves, sun glasses (UVA+UVB protection) and wide brim hats (4-inch brim around the entire circumference of the hat). - Call for new or changing lesions.  Return in about 2 months (around 10/18/2020) for AK's and ISK's recheck.  [name], CMA, am acting as scribe for Sarina Ser, MD .  Documentation: I have reviewed the above documentation for accuracy and completeness, and I agree with the above.  Sarina Ser, MD

## 2020-08-20 ENCOUNTER — Encounter: Payer: Self-pay | Admitting: Dermatology

## 2020-08-20 ENCOUNTER — Other Ambulatory Visit: Payer: Self-pay | Admitting: Family Medicine

## 2020-08-24 ENCOUNTER — Encounter: Payer: Self-pay | Admitting: Family Medicine

## 2020-08-24 ENCOUNTER — Ambulatory Visit (INDEPENDENT_AMBULATORY_CARE_PROVIDER_SITE_OTHER): Payer: HMO | Admitting: Family Medicine

## 2020-08-24 ENCOUNTER — Other Ambulatory Visit: Payer: Self-pay

## 2020-08-24 VITALS — BP 115/80 | HR 76 | Temp 97.5°F | Ht 67.0 in | Wt 216.8 lb

## 2020-08-24 DIAGNOSIS — M25562 Pain in left knee: Secondary | ICD-10-CM | POA: Diagnosis not present

## 2020-08-24 DIAGNOSIS — G4710 Hypersomnia, unspecified: Secondary | ICD-10-CM | POA: Diagnosis not present

## 2020-08-24 DIAGNOSIS — E538 Deficiency of other specified B group vitamins: Secondary | ICD-10-CM

## 2020-08-24 DIAGNOSIS — I480 Paroxysmal atrial fibrillation: Secondary | ICD-10-CM

## 2020-08-24 DIAGNOSIS — K219 Gastro-esophageal reflux disease without esophagitis: Secondary | ICD-10-CM

## 2020-08-24 MED ORDER — CYANOCOBALAMIN 1000 MCG/ML IJ SOLN
1000.0000 ug | Freq: Once | INTRAMUSCULAR | Status: AC
  Administered 2020-08-24: 1000 ug via INTRAMUSCULAR

## 2020-08-24 NOTE — Assessment & Plan Note (Signed)
The patient possibly has a meniscal issue.  We will get an x-ray today.  Consider orthopedic evaluation depending on x-ray result.

## 2020-08-24 NOTE — Patient Instructions (Signed)
Nice to see you. Please see the sleep specialist as planned. Please return next week for your x-ray of your knee.

## 2020-08-24 NOTE — Assessment & Plan Note (Addendum)
Chronic ongoing issue.  Imaging and labs have not revealed a cause for this.  I suspect this is related to his Parkinson's versus his sleep apnea.  He is going to see a sleep specialist later this month and he will keep that appointment.

## 2020-08-24 NOTE — Assessment & Plan Note (Signed)
Seems to be sinus rhythm today.  He will continue Eliquis 5 mg twice daily and carvedilol 3.125 mg twice daily.

## 2020-08-24 NOTE — Progress Notes (Signed)
Andre [name] presents today for injection per MD orders. B12 injection  administered IM in right Upper Arm. Administration without incident. Patient tolerated well.  Franciscojavier Wronski,cma

## 2020-08-24 NOTE — Progress Notes (Signed)
[name], MD Phone: [phone]  Andre [name] is a 85 y.o. male who presents today for f/u.  Left knee pain: This is been going on 2 months.  Hurts along the medial joint line.  There is no injury.  He notes Tylenol does help some.  Atrial fibrillation: Taking Eliquis and carvedilol.  No palpitations.  No bleeding issues.  He does follow with cardiology.  GERD: He is on Nexium 40 mg once daily.  He does not have any reflux.  No abdominal pain, blood in stool, or dysphagia.  He does have a history of Barrett's esophagus.  He did follow-up with GI about a year ago.  He had a barium swallow that revealed mildly altered peristalsis in the esophagus though contrast cleared quickly.  Also the esophagus, stomach, and duodenum were otherwise normal.  They did the barium swallow in lieu of an EGD given his chronic respiratory issues.  They recommended as needed follow-up.  Excessive daytime sleepiness: The patient goes to bed between 1030 and 11 PM and wakes up around 7 AM.  He can almost go right back to bed.  He is sleepy most of the day.  He wears his CPAP nightly and had compliance checked with pulmonology earlier this year.  He has an appointment with a sleep specialist through pulmonology later this month.  He does take Seroquel at night though his excessive sleepiness was going on prior to starting on the Seroquel.  Social History   Tobacco Use  Smoking Status Former Smoker  . Packs/day: 1.00  . Years: 10.00  . Pack years: 10.00  . Types: Cigarettes, Pipe, Cigars  . Quit date: 04/18/1972  . Years since quitting: 48.3  Smokeless Tobacco Former Systems developer  . Types: Chew  . Quit date: 04/18/1972    Current Outpatient Medications on File Prior to Visit  Medication Sig Dispense Refill  . albuterol (VENTOLIN HFA) 108 (90 Base) MCG/ACT inhaler Inhale 2 puffs into the lungs every 6 (six) hours as needed for wheezing or shortness of breath. 8 g 2  . apixaban (ELIQUIS) 5 MG TABS tablet Take 5 mg  by mouth 2 (two) times daily.    [name] buPROPion (WELLBUTRIN XL) 300 MG 24 hr tablet TAKE ONE TABLET BY MOUTH EVERY DAY 90 tablet 1  . carbidopa-levodopa (SINEMET IR) 25-100 MG tablet Take 1 tablet by mouth 3 (three) times daily. 270 tablet 1  . carbidopa-levodopa (SINEMET IR) 25-250 MG tablet Take 1 tablet by mouth 3 (three) times daily. 270 tablet 3  . carvedilol (COREG) 3.125 MG tablet TAKE TWO TABLETS TWICE A DAY WITH MEALS 120 tablet 3  . entacapone (COMTAN) 200 MG tablet TAKE 1 TABLET BY MOUTH 3 TIMES DAILY 270 tablet 3  . esomeprazole (NEXIUM) 40 MG capsule TAKE 1 CAPSULE BY MOUTH ONCE DAILY 30 capsule 2  . feeding supplement (ENSURE ENLIVE / ENSURE PLUS) LIQD Take 237 mLs by mouth 3 (three) times daily between meals. 237 mL 12  . fluticasone (FLONASE) 50 MCG/ACT nasal spray USE 2 PUFFS IN EACH NOSTRIL DAILY 16 g 3  . furosemide (LASIX) 20 MG tablet Take 1 tablet (20 mg total) by mouth daily. 30 tablet   . hydrocortisone 2.5 % lotion Apply topically daily. Apply to affected areas 4 times per week. 118 mL 3  . ketoconazole (NIZORAL) 2 % cream Apply 1 application topically daily as needed for irritation.    [name] ketoconazole (NIZORAL) 2 % shampoo Apply 1 application topically 3 (three) times a  week. Wash scalp, face and ears 120 mL 3  . ketorolac (ACULAR) 0.5 % ophthalmic solution SMARTSIG:1 Drop(s) In Eye(s) Every 4-6 Hours PRN    . lactulose (CHRONULAC) 10 GM/15ML solution TAKE 30 MLS BY MOUTH TWICE DAILY AS NEEDED FOR MODERATE CONSTIPATION 236 mL 0  . levothyroxine (SYNTHROID) 50 MCG tablet TAKE ONE TABLET ON AN EMPTY STOMACH WITHA GLASS OF WATER AT LEAST 30 TO 60 MINUTES BEFORE BREAKFAST 90 tablet 1  . losartan (COZAAR) 100 MG tablet Take 100 mg by mouth daily.    . mometasone (ELOCON) 0.1 % cream     . mupirocin ointment (BACTROBAN) 2 % Apply 1 application topically 3 (three) times daily as needed.    . potassium chloride SA (KLOR-CON) 10 MEQ tablet Take 1 tablet (10 mEq total) by mouth daily.  90 tablet 1  . QUEtiapine (SEROQUEL) 25 MG tablet 1/2 tablet in the afternoon, 1 tablet at night 135 tablet 1  . Respiratory Therapy Supplies (FLUTTER) DEVI 1 Device by Does not apply route daily. 1 each 0  . rosuvastatin (CRESTOR) 10 MG tablet TAKE 1 TABLET BY MOUTH DAILY 90 tablet 0  . sertraline (ZOLOFT) 50 MG tablet TAKE 1 TABLET BY MOUTH DAILY 30 tablet 3  . silodosin (RAPAFLO) 8 MG CAPS capsule Take 1 capsule (8 mg total) by mouth daily with breakfast. 30 capsule 3  . Tiotropium Bromide-Olodaterol (STIOLTO RESPIMAT) 2.5-2.5 MCG/ACT AERS Inhale 2 puffs into the lungs daily. 1 g 5  . Vibegron (GEMTESA) 75 MG TABS Take 75 mg by mouth daily. 30 tablet 0   No current facility-administered medications on file prior to visit.     ROS see history of present illness  Objective  Physical Exam Vitals:   08/24/20 1339  BP: 115/80  Pulse: 76  Temp: (!) 97.5 F (36.4 C)  SpO2: 99%    BP Readings from Last 3 Encounters:  08/24/20 115/80  08/07/20 140/87  07/24/20 138/72   Wt Readings from Last 3 Encounters:  08/24/20 216 lb 12.8 oz (98.3 kg)  08/07/20 212 lb (96.2 kg)  07/24/20 214 lb 9.6 oz (97.3 kg)    Physical Exam Constitutional:      General: He is not in acute distress.    Appearance: He is not diaphoretic.  Cardiovascular:     Rate and Rhythm: Normal rate and regular rhythm.     Heart sounds: Normal heart sounds.  Pulmonary:     Effort: Pulmonary effort is normal.     Breath sounds: Normal breath sounds.  Musculoskeletal:     Comments: Left knee with slight tenderness along the medial joint line, no other tenderness in the knee, no warmth, no swelling, no erythema, negative McMurray's  Skin:    General: Skin is warm and dry.  Neurological:     Mental Status: He is alert.      Assessment/Plan: Please see individual problem list.  Problem List Items Addressed This Visit    PAF (paroxysmal atrial fibrillation) (HCC) (Chronic)    Seems to be sinus rhythm today.   He will continue Eliquis 5 mg twice daily and carvedilol 3.125 mg twice daily.      GERD (gastroesophageal reflux disease)    Asymptomatic.  He has completed evaluation through GI.  He will continue Nexium 40 mg once daily.      Hypersomnia    Chronic ongoing issue.  Imaging and labs have not revealed a cause for this.  I suspect this is related to his Parkinson's  versus his sleep apnea.  He is going to see a sleep specialist later this month and he will keep that appointment.      Left knee pain    The patient possibly has a meniscal issue.  We will get an x-ray today.  Consider orthopedic evaluation depending on x-ray result.      Relevant Orders   DG Knee Complete 4 Views Left    Other Visit Diagnoses    B12 deficiency    -  Primary   Relevant Medications   cyanocobalamin ((VITAMIN B-12)) injection 1,000 mcg (Completed)      Return in about 3 months (around 11/24/2020).  This visit occurred during the SARS-CoV-2 public health emergency.  Safety protocols were in place, including screening questions prior to the visit, additional usage of staff PPE, and extensive cleaning of exam room while observing appropriate contact time as indicated for disinfecting solutions.    [name], MD [place]

## 2020-08-24 NOTE — Assessment & Plan Note (Signed)
Asymptomatic.  He has completed evaluation through GI.  He will continue Nexium 40 mg once daily.

## 2020-09-01 ENCOUNTER — Other Ambulatory Visit: Payer: Self-pay | Admitting: Cardiovascular Disease

## 2020-09-03 ENCOUNTER — Other Ambulatory Visit: Payer: Self-pay | Admitting: *Deleted

## 2020-09-03 MED ORDER — GEMTESA 75 MG PO TABS: 75.0000 mg | ORAL_TABLET | Freq: Every day | ORAL | 0 refills | Status: DC

## 2020-09-04 ENCOUNTER — Other Ambulatory Visit: Payer: Self-pay | Admitting: Urology

## 2020-09-08 ENCOUNTER — Other Ambulatory Visit: Payer: Self-pay

## 2020-09-08 ENCOUNTER — Ambulatory Visit (INDEPENDENT_AMBULATORY_CARE_PROVIDER_SITE_OTHER): Payer: HMO | Admitting: Pulmonary Disease

## 2020-09-08 ENCOUNTER — Encounter: Payer: Self-pay | Admitting: Pulmonary Disease

## 2020-09-08 VITALS — BP 118/60 | HR 78 | Temp 97.5°F | Ht 67.0 in | Wt 216.0 lb

## 2020-09-08 DIAGNOSIS — G4752 REM sleep behavior disorder: Secondary | ICD-10-CM | POA: Diagnosis not present

## 2020-09-08 DIAGNOSIS — Z9989 Dependence on other enabling machines and devices: Secondary | ICD-10-CM

## 2020-09-08 DIAGNOSIS — J849 Interstitial pulmonary disease, unspecified: Secondary | ICD-10-CM

## 2020-09-08 DIAGNOSIS — G4733 Obstructive sleep apnea (adult) (pediatric): Secondary | ICD-10-CM

## 2020-09-08 DIAGNOSIS — G3183 Dementia with Lewy bodies: Secondary | ICD-10-CM

## 2020-09-08 DIAGNOSIS — F0280 Dementia in other diseases classified elsewhere without behavioral disturbance: Secondary | ICD-10-CM | POA: Diagnosis not present

## 2020-09-08 MED ORDER — MELATONIN 3 MG PO CAPS: 3.0000 mg | ORAL_CAPSULE | Freq: Every evening | ORAL | 0 refills | Status: DC

## 2020-09-08 NOTE — Progress Notes (Signed)
Andre [name], Andre [name], Andre [name]  Chief Complaint  Patient presents with  . Consult    Sleep consult-wears CPAP doing good. Sleeps excessively    Constitutional:  BP 118/60 (BP Location: Right Arm, Cuff Size: Normal)   Pulse 78   Temp (!) 97.5 F (36.4 C) (Temporal)   Ht 5\' 7"  (1.702 m)   Wt 216 lb (98 kg)   SpO2 97%   BMI 33.83 kg/m   Past Medical History:  CAD, Diastolic CHF, Parkinson disease with dementia, GERD, HLD, HTN, Hypothyroidism, PAF, Pneumonia, Rt pleural effusion 2019, PE, Meningioma  Past Surgical History:  He  has a past surgical history that includes Other surgical history (1998); Coronary artery bypass graft (01/07/1997); Cholecystectomy (2010); Cardiac catheterization (N/A, 07/14/2015); Back surgery (1960); Cardiac catheterization (N/A, 10/12/2015); LEFT HEART CATH Andre CORONARY ANGIOGRAPHY (N/A, 10/31/2016); CORONARY STENT INTERVENTION (N/A, 10/31/2016); Cardiac catheterization; RIGHT/LEFT HEART CATH Andre CORONARY ANGIOGRAPHY (N/A, 09/18/2017); Andre Colonoscopy with propofol (N/A, 06/07/2018).  Brief Summary:  Andre [name] is a 86 y.o. male former smoker with obstructive sleep apnea Andre daytime sleepiness.      Subjective:   He is here with his wife.  He is followed by Dr. Vaughan Browner for [name] Andre Dr. Jannifer Franklin for neurology.  He had sleep study done in 2010,  This showed severe sleep apnea.  He has been using CPAP.  He recently had mask refit, but still having air leak.  He feels pressure is too high.  He feels sleepy all the time, Andre likes to take naps.  When he is asleep he will be dreaming Andre then his wife will wake him up because it seems like he is acting out his dreams (punching, kicking, talking).  He had dream call upon being woken up.  He goes to sleep at 10 pm.  He falls asleep in 10 minutes.  He wakes up 2 to 3 times to use the bathroom.  He gets out of bed at 10 am.  He feels okay initially in the morning, but then gets sleepy as  the day goes on.  He denies morning headache.  He does not use anything to help him stay awake.  His wife says he was a handful at night.  He was on seroquel in the hospital Andre this seemed to help.  He was started back on seroquel by neurology.  He is now only taking seroquel at night, because the daytime dose made him too sleepy.  He denies sleep walking, or bruxism.  There is no history of restless legs.  He denies sleep hallucinations, sleep paralysis, or cataplexy.  The Epworth score is 18 out of 24.    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  Average AHI 1.6 with CPAP 12 cm H2O.  Significant air leak.  Cardiac Tests:   Echo 08/28/17 >> EF 60 to 65%, grade 2 DD, mild AS, severe LA Andre RA dilation  Social History:  He  reports that he quit smoking about 48 years ago. His smoking use included cigarettes, pipe, Andre cigars. He has a 10.00 pack-year smoking history. He quit smokeless tobacco use about 48 years ago.  His smokeless tobacco use included chew. He reports current alcohol use of about 4.0 standard drinks of alcohol per week. He reports that he does not use drugs.  Family History:  His family history includes Alcohol abuse in his father.    Discussion:  He has history of  obstructive sleep apnea, [name] fibrosis, Andre Parkinson's disease with dementia.  He is compliant with CPAP therapy.  He was previously on supplemental oxygen.  He has persistent daytime sleepiness.  He also reports symptoms suggestive of REM sleep behavior disorder which can be associated with Parkinson's disease.  He is on several medications that could be contributing to his daytime sleepiness.  Assessment/Plan:   Obstructive sleep apnea. 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 - followed by Dr. 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 - followed by Dr. Jannifer Franklin with [facility] - he was told Dr. Jannifer Franklin will be retiring soon, Andre he will need to find a new neurologist - discussed how several of his medications could be contributing to daytime sleepiness  Time Spent Involved in Patient [name] on Day of Examination:  49 minutes  Follow up:  Patient Instructions  Melatonin 3 mg nightly  Will have [employer] change your CPAP setting to 10 cm water pressure  Will arrange for overnight oxygen test with CPAP  Follow up in [place] office in 5 to 6 weeks   Medication List:   Allergies as of 09/08/2020      Reactions   Pravastatin Other (See Comments)   Prednisone Other (See Comments)   Pt states that med makes him hyper Pt states that med makes him hyper      Medication List        Accurate as of Sep 08, 2020  2:34 PM. If you have any questions, ask your nurse or doctor.        albuterol 108 (90 Base) MCG/ACT inhaler Commonly known as: VENTOLIN HFA Inhale 2 puffs into the lungs every 6 (six) hours as needed for wheezing or shortness of breath.   apixaban 5 MG Tabs tablet Commonly known as: ELIQUIS Take 5 mg by mouth 2 (two) times daily.   buPROPion 300 MG 24 hr tablet Commonly known as: WELLBUTRIN XL TAKE ONE TABLET BY MOUTH EVERY DAY   carbidopa-levodopa 25-250 MG tablet Commonly known as: SINEMET IR Take 1 tablet by mouth 3 (three) times daily.   carbidopa-levodopa 25-100 MG tablet Commonly known as: SINEMET IR  Take 1 tablet by mouth 3 (three) times daily.   carvedilol 3.125 MG tablet Commonly known as: COREG TAKE TWO TABLETS TWICE A DAY WITH MEALS   entacapone 200 MG tablet Commonly known as: COMTAN TAKE 1 TABLET BY MOUTH 3 TIMES DAILY   esomeprazole 40 MG capsule Commonly known as: NEXIUM TAKE 1 CAPSULE BY MOUTH ONCE DAILY   feeding supplement Liqd Take 237 mLs by mouth 3 (three) times daily between meals.   fluticasone 50 MCG/ACT nasal spray Commonly known as: FLONASE USE 2 PUFFS IN EACH NOSTRIL DAILY   Flutter Devi 1 Device by Does not apply route daily.   furosemide 20 MG tablet Commonly known as: LASIX Take 1 tablet (20 mg total) by mouth daily.   Gemtesa 75 MG Tabs Generic drug: Vibegron Take 75 mg by mouth daily.   hydrocortisone 2.5 % lotion Apply topically daily. Apply to affected areas 4 times per week.   ketoconazole 2 % cream Commonly known as: NIZORAL Apply 1 application topically daily as needed for irritation.   ketoconazole 2 % shampoo Commonly known as: NIZORAL Apply 1 application topically 3 (three) times a week. Wash scalp, face Andre ears   ketorolac 0.5 % ophthalmic solution Commonly known as: ACULAR SMARTSIG:1 Drop(s) In Eye(s) Every 4-6 Hours PRN   lactulose 10 GM/15ML solution Commonly known as:  CHRONULAC TAKE 30 MLS BY MOUTH TWICE DAILY AS NEEDED FOR MODERATE CONSTIPATION   levothyroxine 50 MCG tablet Commonly known as: SYNTHROID TAKE ONE TABLET ON AN EMPTY STOMACH WITHA GLASS OF WATER AT LEAST 30 TO 60 MINUTES BEFORE BREAKFAST   losartan 100 MG tablet Commonly known as: COZAAR Take 100 mg by mouth daily.   Melatonin 3 MG Caps Take 1 capsule (3 mg total) by mouth at bedtime. Started by: Chesley Mires, MD   mometasone 0.1 % cream Commonly known as: ELOCON   mupirocin ointment 2 % Commonly known as: BACTROBAN Apply 1 application topically 3 (three) times daily as needed.   potassium chloride 10 MEQ tablet Commonly known as: KLOR-CON Take 1 tablet (10 mEq total) by mouth daily.   QUEtiapine 25 MG tablet Commonly known as: SEROquel 1/2 tablet in the afternoon, 1 tablet at night What changed: additional instructions   rosuvastatin 10 MG tablet Commonly known as: CRESTOR TAKE 1 TABLET BY MOUTH DAILY   sertraline 50 MG tablet Commonly known as: ZOLOFT TAKE 1 TABLET BY MOUTH DAILY   silodosin 8 MG Caps capsule Commonly known as: RAPAFLO Take 1 capsule (8 mg total) by mouth daily with breakfast.   Stiolto Respimat 2.5-2.5 MCG/ACT Aers Generic drug: Tiotropium Bromide-Olodaterol Inhale 2 puffs into the lungs daily.       Signature:  Chesley Mires, MD [place] Pager - [phone] 09/08/2020, 2:34 PM

## 2020-09-08 NOTE — Patient Instructions (Signed)
Melatonin 3 mg nightly  Will have [employer] change your CPAP setting to 10 cm water pressure  Will arrange for overnight oxygen test with CPAP  Follow up in [place] office in 5 to 6 weeks

## 2020-09-09 ENCOUNTER — Telehealth: Payer: Self-pay | Admitting: Family Medicine

## 2020-09-09 ENCOUNTER — Other Ambulatory Visit: Payer: Self-pay | Admitting: Urology

## 2020-09-09 NOTE — Telephone Encounter (Signed)
Patient notified Andre [name] is approved through 04/17/2021.

## 2020-09-21 ENCOUNTER — Telehealth: Payer: Self-pay | Admitting: Cardiovascular Disease

## 2020-09-21 NOTE — Telephone Encounter (Signed)
Was able to return call the wife [name] (DPR approved), she advised that Mr. Gullion has been "more tired and sleeping a lot laetly, has no energy". Has seen the pulmonologist and done a sleep study r/t to his sleep and tiredness and sometimes his parkinson can caused the increase tiredness. She was questioning if see needs appt with Dr. Fletcher Anon.  [name] reports hx of CHF, but pt has not had any CP, no increased with shob, and his BP/HR has been WNL. Hx of Afib, but also reports no current episodes. Advised that Mr. Frangos increased in low energy and tiredness may not be cardiac related at this time, could be flare up with his parkinson disease, or could be a flute in seasional allergies or his Wellbutrin could be the source of his increase tiredness and low energy.  Would recommended a call into Dr. Ellen Henri office to see if Mr. Radziewicz needs a f/u visit and evaluation of symptoms and meds. Last seen 5/9. [name] voiced "that is what I was thinking but I wanted to check here first". Verbalized understanding, advised if anything changes then please reach back out with any new or worsening cardiac symptoms. Wife agrees and will call back with anything further.

## 2020-09-21 NOTE — Telephone Encounter (Signed)
Patient spouse calling  States he has no energy  Wants to discuss what may be causing  Please call to discuss

## 2020-09-24 ENCOUNTER — Telehealth: Payer: Self-pay | Admitting: Pulmonary Disease

## 2020-09-24 NOTE — Telephone Encounter (Signed)
HRCT ordered by Dr.Mannam for 10/2020.  Message routed to [facility] pool to assist in scheduling

## 2020-09-25 NOTE — Telephone Encounter (Signed)
Sched for 7/8 at 1:30; 1:15 arrival time at [facility].   LVM for pt and mailed letter w/ appt info. Nothing further needed.

## 2020-09-30 ENCOUNTER — Telehealth: Payer: Self-pay | Admitting: Pulmonary Disease

## 2020-09-30 NOTE — Telephone Encounter (Signed)
ATC LVMTCB tomorrow

## 2020-10-01 NOTE — Telephone Encounter (Signed)
Attempted to call pt's wife but unable to reach. Left message for her to return call.

## 2020-10-01 NOTE — Telephone Encounter (Signed)
CPAP 09/01/20 to 09/30/20 >> used on 29 of 30 nights with average 11 hrs 32 min.  Average AHI 2.3 with CPAP 10 cm H2O.     Please let her know that CPAP report shows good control of sleep apnea with current set up.  Daytime sleepiness likely related to Parkinson's disease and medications he has to use to manage this.  They should contact his neurologist to have this looked into further.  Will call them back once ONO on CPAP reviewed.

## 2020-10-01 NOTE — Telephone Encounter (Signed)
I called and spoke with the pt's spouse  She states that she is concerned bc pt is sleeping a lot- goes to sleep about 9-10 pm and waking at around 2 pm  She states he is a little groggy when he gets up but overall seems well rested  They have not heard from [place] on ONO- I sent community msg to [place] Shearer, [place] and Stephannie Peters with [employer] to check on this  Spouse would like Dr Halford Chessman to review DL Printed DL iand faxed to [place] office so he can try and review this today  Thanks

## 2020-10-02 ENCOUNTER — Telehealth: Payer: Self-pay | Admitting: Neurology

## 2020-10-02 NOTE — Telephone Encounter (Signed)
Patient's spouse, Patricia(DPR) is aware of recommendations and voiced her understanding.  Nothing further needed.

## 2020-10-02 NOTE — Telephone Encounter (Signed)
Called patient's wife but call went straight to VM. Left message for her to call back.

## 2020-10-02 NOTE — Telephone Encounter (Addendum)
Pt's wife, Flavius Repsher (on [place]) called, he has been sleeping 15 hrs a day. We went to see Dr. Llana Aliment. He suggested seeing neurologist because is related to his Parkinson's. Would like a call from the nurse. Please call after 1 pm

## 2020-10-02 NOTE — Telephone Encounter (Signed)
I called the patient, left a message, I will call back later. 

## 2020-10-04 MED ORDER — MODAFINIL 200 MG PO TABS: 200.0000 mg | ORAL_TABLET | Freq: Every day | ORAL | 3 refills | Status: DC

## 2020-10-04 NOTE — Telephone Encounter (Signed)
I called the patient and talk with the wife.  The patient continues to sleep quite a bit throughout the day.  I will try Provigil 200 mg in the morning, he does have a history of sleep apnea on CPAP.  They are doing oxygen saturation evaluations at night, the results were not available.

## 2020-10-06 ENCOUNTER — Telehealth: Payer: Self-pay

## 2020-10-06 ENCOUNTER — Ambulatory Visit (INDEPENDENT_AMBULATORY_CARE_PROVIDER_SITE_OTHER): Payer: HMO

## 2020-10-06 ENCOUNTER — Other Ambulatory Visit: Payer: Self-pay

## 2020-10-06 DIAGNOSIS — E538 Deficiency of other specified B group vitamins: Secondary | ICD-10-CM

## 2020-10-06 MED ORDER — CYANOCOBALAMIN 1000 MCG/ML IJ SOLN
1000.0000 ug | Freq: Once | INTRAMUSCULAR | Status: AC
  Administered 2020-10-06: 1000 ug via INTRAMUSCULAR

## 2020-10-06 NOTE — Telephone Encounter (Signed)
PA Case: [id number], Status: Approved, Coverage Starts on: 10/06/2020 12:00:00 AM, Coverage Ends on: 10/06/2021 12:00:00 AM.

## 2020-10-06 NOTE — Progress Notes (Signed)
Patient presented for B 12 injection to left deltoid, patient voiced no concerns nor showed any signs of distress during injection. 

## 2020-10-06 NOTE — Telephone Encounter (Signed)
I submitted a PA request on CMM, Key: [id number] - PA Case ID: [id number]. Awaiting determination from [facility].

## 2020-10-07 ENCOUNTER — Other Ambulatory Visit: Payer: Self-pay | Admitting: Urology

## 2020-10-07 DIAGNOSIS — H2513 Age-related nuclear cataract, bilateral: Secondary | ICD-10-CM | POA: Diagnosis not present

## 2020-10-07 DIAGNOSIS — H04123 Dry eye syndrome of bilateral lacrimal glands: Secondary | ICD-10-CM | POA: Diagnosis not present

## 2020-10-07 DIAGNOSIS — H25043 Posterior subcapsular polar age-related cataract, bilateral: Secondary | ICD-10-CM | POA: Diagnosis not present

## 2020-10-07 DIAGNOSIS — H18413 Arcus senilis, bilateral: Secondary | ICD-10-CM | POA: Diagnosis not present

## 2020-10-08 NOTE — Telephone Encounter (Signed)
Approval for Modafanil from [place] through 10/06/21

## 2020-10-13 ENCOUNTER — Encounter: Payer: Self-pay | Admitting: Pulmonary Disease

## 2020-10-13 ENCOUNTER — Ambulatory Visit: Payer: HMO | Admitting: Pulmonary Disease

## 2020-10-13 ENCOUNTER — Other Ambulatory Visit: Payer: Self-pay

## 2020-10-13 VITALS — BP 136/80 | HR 75 | Temp 97.6°F | Ht 67.0 in | Wt 220.1 lb

## 2020-10-13 DIAGNOSIS — G4752 REM sleep behavior disorder: Secondary | ICD-10-CM

## 2020-10-13 DIAGNOSIS — F0280 Dementia in other diseases classified elsewhere without behavioral disturbance: Secondary | ICD-10-CM | POA: Diagnosis not present

## 2020-10-13 DIAGNOSIS — Z9989 Dependence on other enabling machines and devices: Secondary | ICD-10-CM

## 2020-10-13 DIAGNOSIS — G4733 Obstructive sleep apnea (adult) (pediatric): Secondary | ICD-10-CM

## 2020-10-13 DIAGNOSIS — G3183 Dementia with Lewy bodies: Secondary | ICD-10-CM

## 2020-10-13 NOTE — Patient Instructions (Signed)
Will call with results of overnight oxygen test  Follow up in 6 months 

## 2020-10-13 NOTE — Progress Notes (Signed)
[facility]  Chief Complaint  Patient presents with   Sleep Apnea     Constitutional:  BP 136/80   Pulse 75   Temp 97.6 F (36.4 C) (Oral)   Ht 5\' 7"  (1.702 m)   Wt 220 lb 2 oz (99.8 kg)   SpO2 97%   BMI 34.48 kg/m   Past Medical History:  CAD, Diastolic CHF, Parkinson disease with dementia, GERD, HLD, HTN, Hypothyroidism, PAF, Pneumonia, Rt pleural effusion 2019, PE, Meningioma  Past Surgical History:  He  has a past surgical history that includes Other surgical history (1998); Coronary artery bypass graft (01/07/1997); Cholecystectomy (2010); Cardiac catheterization (N/A, 07/14/2015); Back surgery (1960); Cardiac catheterization (N/A, 10/12/2015); LEFT HEART CATH AND CORONARY ANGIOGRAPHY (N/A, 10/31/2016); CORONARY STENT INTERVENTION (N/A, 10/31/2016); Cardiac catheterization; RIGHT/LEFT HEART CATH AND CORONARY ANGIOGRAPHY (N/A, 09/18/2017); and Colonoscopy with propofol (N/A, 06/07/2018).  Brief Summary:  Andre [name] is a 85 y.o. male former smoker with obstructive sleep apnea and daytime sleepiness.      Subjective:   He is here with his wife.  Not having dream enactment as much.  Mask leaks.  Started on modafinil recently.  Have episodes of hallucinations.  Having more trouble with short term memory.  He is followed by Dr. Vaughan Browner for pulmonary and Dr. Jannifer Franklin for neurology.   Physical Exam:   Appearance - well kempt   ENMT - no sinus tenderness, no oral exudate, no LAN, Mallampati 3 airway, no stridor  Respiratory - equal breath sounds bilaterally, no wheezing or rales  CV - s1s2 regular rate and rhythm, no murmurs  Ext - no clubbing, no edema  Skin - no rashes  Psych - normal mood and affect   Pulmonary testing:  PFT 07/24/20 >> FEV1 1.64 (67%), FEV1% 71, DLCO 73%  Chest Imaging:  HRCT chest 10/24/19 >> calcified b/l pleural plaques, mild fibrosis, minimal paraseptal emphysema, mild air trapping (suspicious  for UIP associated with fibrotic pulmonary asbestosis)  Sleep Tests:  PSG 06/26/08 >> AHI 70, SpO2 85% CPAP 08/08/20 to 09/06/20 >> used on 30 of 30 nights with average 10 hrs 24 min.  Average AHI 1.6 with CPAP 12 cm H2O.  Significant air leak.  Cardiac Tests:  Echo 08/28/17 >> EF 60 to 65%, grade 2 DD, mild AS, severe LA and RA dilation  Social History:  He  reports that he quit smoking about 48 years ago. His smoking use included cigarettes, pipe, and cigars. He has a 10.00 pack-year smoking history. He quit smokeless tobacco use about 48 years ago.  His smokeless tobacco use included chew. He reports current alcohol use of about 4.0 standard drinks of alcohol per week. He reports that he does not use drugs.  Family History:  His family history includes Alcohol abuse in his father.     Assessment/Plan:   Obstructive sleep apnea. 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 - followed by Dr. 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 - followed by Dr. Jannifer Franklin with [facility]  Time Spent Involved in  Patient Care on Day of Examination:  23 minutes  Follow up:   Patient Instructions  Will call with results of overnight oxygen test  Follow up in 6 months  Medication List:   Allergies as of 10/13/2020       Reactions   Pravastatin Other (See Comments)   Prednisone Other (See Comments)   Pt states that med makes him hyper Pt states that med makes him hyper        Medication List        Accurate as of October 13, 2020 12:23 PM. If  you have any questions, ask your nurse or doctor.          albuterol 108 (90 Base) MCG/ACT inhaler Commonly known as: VENTOLIN HFA Inhale 2 puffs into the lungs every 6 (six) hours as needed for wheezing or shortness of breath.   apixaban 5 MG Tabs tablet Commonly known as: ELIQUIS Take 5 mg by mouth 2 (two) times daily.   buPROPion 300 MG 24 hr tablet Commonly known as: WELLBUTRIN XL TAKE ONE TABLET BY MOUTH EVERY DAY   carbidopa-levodopa 25-250 MG tablet Commonly known as: SINEMET IR Take 1 tablet by mouth 3 (three) times daily.   carbidopa-levodopa 25-100 MG tablet Commonly known as: SINEMET IR Take 1 tablet by mouth 3 (three) times daily.   carvedilol 3.125 MG tablet Commonly known as: COREG TAKE TWO TABLETS TWICE A DAY WITH MEALS   entacapone 200 MG tablet Commonly known as: COMTAN TAKE 1 TABLET BY MOUTH 3 TIMES DAILY   esomeprazole 40 MG capsule Commonly known as: NEXIUM TAKE 1 CAPSULE BY MOUTH ONCE DAILY   feeding supplement Liqd Take 237 mLs by mouth 3 (three) times daily between meals.   fluticasone 50 MCG/ACT nasal spray Commonly known as: FLONASE USE 2 PUFFS IN EACH NOSTRIL DAILY   Flutter Devi 1 Device by Does not apply route daily.   furosemide 20 MG tablet Commonly known as: LASIX Take 1 tablet (20 mg total) by mouth daily.   Gemtesa 75 MG Tabs Generic drug: Vibegron TAKE 1 TABLET BY MOUTH DAILY.   hydrocortisone 2.5 % lotion Apply topically daily. Apply to affected areas 4 times per week.   ketoconazole 2 % cream Commonly known as: NIZORAL Apply 1 application topically daily as needed for irritation.   ketoconazole 2 % shampoo Commonly known as: NIZORAL Apply 1 application topically 3 (three) times a week. Wash scalp, face and ears   ketorolac 0.5 % ophthalmic solution Commonly known as: ACULAR SMARTSIG:1 Drop(s) In Eye(s) Every 4-6 Hours PRN   lactulose 10 GM/15ML solution Commonly known as: CHRONULAC TAKE 30 MLS BY MOUTH TWICE  DAILY AS NEEDED FOR MODERATE CONSTIPATION   levothyroxine 50 MCG tablet Commonly known as: SYNTHROID TAKE ONE TABLET ON AN EMPTY STOMACH WITHA GLASS OF WATER AT LEAST 30 TO 60 MINUTES BEFORE BREAKFAST   losartan 100 MG tablet Commonly known as: COZAAR Take 100 mg by mouth daily.   Melatonin 3 MG Caps Take 1 capsule (3 mg total) by mouth at bedtime.   modafinil 200 MG tablet Commonly known as: Provigil Take 1 tablet (200 mg total) by mouth daily.   mometasone 0.1 % cream Commonly known as: ELOCON   mupirocin ointment 2 % Commonly known as: BACTROBAN Apply 1 application topically 3 (three) times daily as needed.   potassium chloride 10 MEQ tablet Commonly known as: KLOR-CON Take 1 tablet (10 mEq total) by mouth daily.   QUEtiapine 25 MG tablet Commonly  known as: SEROquel 1/2 tablet in the afternoon, 1 tablet at night What changed: additional instructions   rosuvastatin 10 MG tablet Commonly known as: CRESTOR TAKE 1 TABLET BY MOUTH DAILY   sertraline 50 MG tablet Commonly known as: ZOLOFT TAKE 1 TABLET BY MOUTH DAILY   silodosin 8 MG Caps capsule Commonly known as: RAPAFLO Take 1 capsule (8 mg total) by mouth daily with breakfast.   Stiolto Respimat 2.5-2.5 MCG/ACT Aers Generic drug: Tiotropium Bromide-Olodaterol Inhale 2 puffs into the lungs daily.        Signature:  Chesley Mires, MD [place] Pager - [phone] 10/13/2020, 12:23 PM

## 2020-10-15 ENCOUNTER — Institutional Professional Consult (permissible substitution): Payer: HMO | Admitting: Pulmonary Disease

## 2020-10-16 ENCOUNTER — Telehealth: Payer: Self-pay | Admitting: Pulmonary Disease

## 2020-10-16 DIAGNOSIS — G4733 Obstructive sleep apnea (adult) (pediatric): Secondary | ICD-10-CM

## 2020-10-16 NOTE — Telephone Encounter (Signed)
ONO with CPAP 10/07/20 >> test time 8 hrs 9 min.  Baseline SpO2 93%, low SpO2 77%.  Spent 9 min 16 sec with SpO2 < 88%.   Please let him know his oxygen level is still low at night even though he has been using CPAP.  He needs to have a CPAP titration study done to determine if he needs to be set up with supplemental oxygen at night with CPAP.  If he is agreeable, then please place order for CPAP titration study and mention in comment section that we want to determine whether he needs supplemental oxygen at night with CPAP.

## 2020-10-20 NOTE — Telephone Encounter (Signed)
ATC patient to go over ONO results LMTCB

## 2020-10-20 NOTE — Telephone Encounter (Signed)
Spoke with patient and his wife to let him know of ONO results and that Dr. Halford Chessman would like to order a CPAP titration. They both expressed understanding. Order has been placed nothing further needed at this time.

## 2020-10-22 ENCOUNTER — Ambulatory Visit: Payer: HMO | Admitting: Neurology

## 2020-10-22 ENCOUNTER — Telehealth: Payer: Self-pay | Admitting: *Deleted

## 2020-10-22 ENCOUNTER — Encounter: Payer: Self-pay | Admitting: Neurology

## 2020-10-22 VITALS — BP 142/94 | HR 58 | Ht 67.0 in | Wt 228.0 lb

## 2020-10-22 DIAGNOSIS — G20 Parkinson's disease: Secondary | ICD-10-CM | POA: Diagnosis not present

## 2020-10-22 DIAGNOSIS — F0280 Dementia in other diseases classified elsewhere without behavioral disturbance: Secondary | ICD-10-CM | POA: Diagnosis not present

## 2020-10-22 DIAGNOSIS — R269 Unspecified abnormalities of gait and mobility: Secondary | ICD-10-CM

## 2020-10-22 DIAGNOSIS — G3183 Dementia with Lewy bodies: Secondary | ICD-10-CM

## 2020-10-22 MED ORDER — CARBIDOPA-LEVODOPA 25-250 MG PO TABS: 1.5000 | ORAL_TABLET | Freq: Three times a day (TID) | ORAL | 3 refills | Status: DC

## 2020-10-22 MED ORDER — MODAFINIL 200 MG PO TABS: ORAL_TABLET | ORAL | 3 refills | Status: DC

## 2020-10-22 NOTE — Progress Notes (Signed)
Reason for visit: Parkinson's disease, memory disturbance, gait disturbance  Andre [name] is an 85 y.o. male  History of present illness:  Andre [name] is an 85 year old right-handed white male with a history of Parkinson's disease.  Andre [name] has had a reduction in the Sinemet dosing recently, Andre [name] has tolerated this well.  Andre [name] takes 1.5 of the 25/250 mg Sinemet tablets 3 times daily.  Andre [name] has sleep apnea on CPAP, Andre [name] has had a recent evaluation, they have noted desaturations and his oxygen at night, Andre [name] will be going for a CPAP titration in the near future.  Andre [name] is still has a lot of daytime drowsiness, Provigil was recently added taking 200 mg in the morning.  The patient believes that this has resulted in increased energy but his wife indicates that Andre [name] is still sleeping quite a bit during the day.  The patient has gait instability, Andre [name] will fall on occasion, Andre [name] fell a week ago when Andre [name] tried to sit down on the toilet and missed the toilet.  The patient has a cane and a walker at home but Andre [name] does not use them.  Andre [name] has had physical therapy for his walking in the early part of 2022.  Andre [name] returns today for further evaluation.  Andre [name] does not operate a motor vehicle.  Andre [name] reports that his hallucinations still occur but are not concerning to him, Andre [name] may see a bug every once in a while, but nothing that is threatening to him.  Andre [name] does take Seroquel at night.  Past Medical History:  Diagnosis Date   Atherosclerosis of abdominal aorta ([place])    Basal cell carcinoma 03/04/2008   Right nose supratip.    CAD (coronary artery disease)    Cervical spondylosis 10/01/2013   Chronic diastolic CHF (congestive heart failure) ([place])    a. 07/2016 Echo: >55%; b. 10/2016 Echo: EF 55-60%, Gr1 DD, Ao sclerosis w/o stenosis, sev dil LA; c. 08/2017 Echo: EF 60-65%, no rwma, Gr2 DD, mild AS, sev dil LA/RA.   Coronary artery disease    a. 1998 s/p mini-cabg @ [facility] - LIMA->LAD;  b. 07/2016 St Echo: Inadequate HR w/ HTN response;  c.  08/2016 MV:  EF 67%, no ischemia; d. 10/2016 NSTEMI/Cath: RCA 95p (4.0x26 Onyx DES), LIMA->LAD nl; e. 09/2017 Cath: LM 40/30, LAD 100ost, RI 80, LCX nl, OM2/3 nl, RCA patent stent, 47m, LIMA->LAD nl-->Med Rx.   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 s/p DCCV-->maintaining sinus on amiodarone;  b. CHA2DS2VASc = 5-->[facility].   Parkinson's disease ([place])    tremors   Pleural effusion, right    a. 09/2017 s/p thoracentesis.   PNA (pneumonia) 08/26/2017   Pulmonary embolism ([place]) 2011   Pulmonary fibrosis ([place])    Secondary erythrocytosis 01/28/2015   Sleep apnea    wears CPAP   Squamous cell carcinoma of skin 03/19/2015   Right lateral crown. KA-like pattern   Thrombocytopenia ([place])     Past Surgical History:  Procedure Laterality Date   [place]  2010  COLONOSCOPY WITH PROPOFOL N/A 06/07/2018   Procedure: COLONOSCOPY WITH PROPOFOL;  Surgeon: Lollie Sails, MD;  [facility] END[facility]COPY;  Service: Endoscopy;  Laterality: N/A;   CORONARY ARTERY BYPASS GRAFT  01/07/1997   CORONARY STENT INTERVENTION N/A 10/31/2016   Procedure: Coronary Stent Intervention;  Surgeon: Wellington Hampshire, MD;  Location: [place] CV LAB;  Service: Cardiovascular;  Laterality: N/A;   ELECTROPHYSIOLOGIC STUDY N/A 07/14/2015   Procedure: CARDIOVERSION;  Surgeon: Yolonda Kida, MD;  Location: [facility] ORS;  Service: Cardiovascular;  Laterality: N/A;   ELECTROPHYSIOLOGIC STUDY N/A 10/12/2015   Procedure: CARDIOVERSION;  Surgeon: Minna Merritts, MD;  Location: [facility] ORS;  Service: Cardiovascular;   Laterality: N/A;   LEFT HEART CATH AND CORONARY ANGIOGRAPHY N/A 10/31/2016   Procedure: Left Heart Cath and Coronary Angiography;  Surgeon: Wellington Hampshire, MD;  Location: [place] CV LAB;  Service: Cardiovascular;  Laterality: N/A;   OTHER SURGICAL HISTORY  1998   Bypass   RIGHT/LEFT HEART CATH AND CORONARY ANGIOGRAPHY N/A 09/18/2017   Procedure: RIGHT/LEFT HEART CATH AND CORONARY ANGIOGRAPHY;  Surgeon: Wellington Hampshire, MD;  Location: [place] CV LAB;  Service: Cardiovascular;  Laterality: N/A;    Family History  Problem Relation Age of Onset   Alcohol abuse Father     Social history:  reports that Andre [name] quit smoking about 48 years ago. His smoking use included cigarettes, pipe, and cigars. Andre [name] has a 10.00 pack-year smoking history. Andre [name] quit smokeless tobacco use about 48 years ago.  His smokeless tobacco use included chew. Andre [name] reports current alcohol use of about 4.0 standard drinks of alcohol per week. Andre [name] reports that Andre [name] does not use drugs.    Allergies  Allergen Reactions   Pravastatin Other (See Comments)   Prednisone Other (See Comments)    Pt states that med makes him hyper Pt states that med makes him hyper    Medications:  Prior to Admission medications   Medication Sig Start Date End Date Taking? Authorizing Provider  albuterol (VENTOLIN HFA) 108 (90 Base) MCG/ACT inhaler Inhale 2 puffs into the lungs every 6 (six) hours as needed for wheezing or shortness of breath. 05/07/20  Yes Danford, Suann Larry, MD  apixaban (ELIQUIS) 5 MG TABS tablet Take 5 mg by mouth 2 (two) times daily.   Yes [provider]  buPROPion (WELLBUTRIN XL) 300 MG 24 hr tablet TAKE ONE TABLET BY MOUTH EVERY DAY 08/20/20  Yes [name], MD  carbidopa-levodopa (SINEMET IR) 25-100 MG tablet Take 1 tablet by mouth 3 (three) times daily. 06/03/20  Yes Kathrynn Ducking, MD  carbidopa-levodopa (SINEMET IR) 25-250 MG tablet Take 1 tablet by mouth 3 (three) times daily. 06/03/20  Yes Kathrynn Ducking, MD  carvedilol (COREG) 3.125 MG tablet TAKE TWO TABLETS TWICE A DAY WITH MEALS 07/13/20  Yes Wellington Hampshire, MD  entacapone (COMTAN) 200 MG tablet TAKE 1 TABLET BY MOUTH 3 TIMES DAILY 07/16/20  Yes Kathrynn Ducking, MD  esomeprazole (NEXIUM) 40 MG capsule TAKE 1 CAPSULE BY MOUTH ONCE DAILY 08/10/20  Yes [name], MD  feeding supplement (ENSURE ENLIVE / ENSURE PLUS) LIQD Take 237 mLs by mouth 3 (three) times daily between meals. 05/07/20  Yes Danford, Suann Larry, MD  fluticasone (FLONASE) 50 MCG/ACT nasal spray USE 2 PUFFS IN EACH NOSTRIL DAILY 09/05/19  Yes [name], MD  furosemide (LASIX) 20 MG tablet Take 1 tablet (20 mg total) by mouth daily. 05/07/20  Yes Myrene Buddy  P, MD  hydrocortisone 2.5 % lotion Apply topically daily. Apply to affected areas 4 times per week. 02/05/20  Yes Ralene Bathe, MD  ketoconazole (NIZORAL) 2 % cream Apply 1 application topically daily as needed for irritation. 01/01/18  Yes [provider]  ketoconazole (NIZORAL) 2 % shampoo Apply 1 application topically 3 (three) times a week. Wash scalp, face and ears 02/05/20  Yes Ralene Bathe, MD  ketorolac (ACULAR) 0.5 % ophthalmic solution SMARTSIG:1 Drop(s) In Eye(s) Every 4-6 Hours PRN 06/19/19  Yes [provider]  lactulose (CHRONULAC) 10 GM/15ML solution TAKE 30 MLS BY MOUTH TWICE DAILY AS NEEDED FOR MODERATE CONSTIPATION 07/27/20  Yes Crecencio Mc, MD  levothyroxine (SYNTHROID) 50 MCG tablet TAKE ONE TABLET ON AN EMPTY STOMACH WITHA GLASS OF WATER AT LEAST 30 TO 60 MINUTES BEFORE BREAKFAST 08/20/20  Yes [name], MD  losartan (COZAAR) 100 MG tablet Take 100 mg by mouth daily.   Yes [provider]  Melatonin 3 MG CAPS Take 1 capsule (3 mg total) by mouth at bedtime. 09/08/20  Yes Chesley Mires, MD  modafinil (PROVIGIL) 200 MG tablet Take 1 tablet (200 mg total) by mouth daily. 10/04/20  Yes Kathrynn Ducking, MD  mometasone (ELOCON) 0.1 %  cream  01/01/18  Yes [provider]  mupirocin ointment (BACTROBAN) 2 % Apply 1 application topically 3 (three) times daily as needed.   Yes [provider]  potassium chloride SA (KLOR-CON) 10 MEQ tablet Take 1 tablet (10 mEq total) by mouth daily. 12/12/19  Yes Wellington Hampshire, MD  QUEtiapine (SEROQUEL) 25 MG tablet 1/2 tablet in the afternoon, 1 tablet at night Patient taking differently: 1 tablet at night 06/18/20  Yes Kathrynn Ducking, MD  Respiratory Therapy Supplies (FLUTTER) DEVI 1 Device by Does not apply route daily. 05/29/19  Yes Martyn Ehrich, NP  rosuvastatin (CRESTOR) 10 MG tablet TAKE 1 TABLET BY MOUTH DAILY 09/01/20  Yes Wellington Hampshire, MD  sertraline (ZOLOFT) 50 MG tablet TAKE 1 TABLET BY MOUTH DAILY 06/30/20  Yes [name], MD  silodosin (RAPAFLO) 8 MG CAPS capsule Take 1 capsule (8 mg total) by mouth daily with breakfast. 07/09/20  Yes Stoioff, Ronda Fairly, MD  Tiotropium Bromide-Olodaterol (STIOLTO RESPIMAT) 2.5-2.5 MCG/ACT AERS Inhale 2 puffs into the lungs daily. 07/29/20  Yes Mannam, Praveen, MD  Vibegron (GEMTESA) 75 MG TABS TAKE 1 TABLET BY MOUTH DAILY. 10/07/20  Yes Stoioff, Ronda Fairly, MD    ROS:  Out of a complete 14 system review of symptoms, the patient complains only of the following symptoms, and all other reviewed systems are negative.  Hallucinations Drowsiness Balance issues  Blood pressure (!) 142/94, pulse (!) 58, height 5\' 7"  (1.702 m), weight 228 lb (103.4 kg).  Physical Exam  General: The patient is alert and cooperative at the time of the examination.  The patient is markedly obese.  Skin: No significant peripheral edema is noted.   Neurologic Exam  Mental status: The patient is alert and oriented x 3 at the time of the examination. The Mini-Mental status examination done today shows a total score of 22/30.   Cranial nerves: Facial symmetry is present. Speech is normal, no aphasia or dysarthria is noted. Extraocular  movements are full. Visual fields are full.  Mild masking the face is seen.  Motor: The patient has good strength in all 4 extremities.  Sensory examination: Soft touch sensation is symmetric on the face, arms, and legs.  Coordination: The patient has good finger-nose-finger and heel-to-shin bilaterally.  The patient has mild dyskinesias.  Gait and station: The patient has a slightly wide-based gait.  Tandem gait was not attempted.  Romberg is negative but is slightly unsteady.  The patient is able to rise from a seated position with arms crossed.  Reflexes: Deep tendon reflexes are symmetric.   Assessment/Plan:  1.  Parkinson's disease  2.  Gait disturbance  3.  Memory disturbance  We will follow the memory issues over time.  The patient seems to be relatively stable in this regard.  The patient is doing fairly well with his current dose of Sinemet and Comtan, we will not offer this.  We will go up on the Provigil taking 200 milligrams in the morning and at midday.  Andre [name] will follow-up in 4 months, in the future Andre [name] can be seen in follow-up with Dr. Rexene Alberts.  Jill Alexanders MD 10/22/2020 11:31 AM  [facility] [address] [place], [place] [address]  Phone [phone] Fax [phone]

## 2020-10-22 NOTE — Telephone Encounter (Signed)
Modafinil PA, key:  [id number], unable to complete on CMM. Called [facility], spoke with Santiago Glad to initiate modafinil PA, urgent request. G20, R26.9, G47.33 sleep apnea, for continuation of therapy, increased dose. She submitted PA to pharmacist, turn around <24 hours, will get via fax. [facility] #[phone]

## 2020-10-23 ENCOUNTER — Ambulatory Visit
Admission: RE | Admit: 2020-10-23 | Discharge: 2020-10-23 | Disposition: A | Discharge status: Home / Self Care | Payer: HMO | Source: Ambulatory Visit | Attending: Pulmonary Disease | Admitting: Pulmonary Disease

## 2020-10-23 ENCOUNTER — Other Ambulatory Visit: Payer: Self-pay

## 2020-10-23 ENCOUNTER — Other Ambulatory Visit: Payer: Self-pay | Admitting: Cardiovascular Disease

## 2020-10-23 DIAGNOSIS — R0602 Shortness of breath: Secondary | ICD-10-CM | POA: Diagnosis not present

## 2020-10-23 DIAGNOSIS — J90 Pleural effusion, not elsewhere classified: Secondary | ICD-10-CM | POA: Diagnosis not present

## 2020-10-23 DIAGNOSIS — J439 Emphysema, unspecified: Secondary | ICD-10-CM | POA: Diagnosis not present

## 2020-10-23 DIAGNOSIS — I4821 Permanent atrial fibrillation: Secondary | ICD-10-CM

## 2020-10-23 DIAGNOSIS — I700 Atherosclerosis of aorta: Secondary | ICD-10-CM | POA: Diagnosis not present

## 2020-10-23 DIAGNOSIS — J8410 Pulmonary fibrosis, unspecified: Secondary | ICD-10-CM | POA: Diagnosis not present

## 2020-10-23 NOTE — Telephone Encounter (Signed)
Prescription refill request for Eliquis received. Indication: a fib Last office visit: 06/25/20 Scr: 0.89 Age: 85 Weight: 103kg

## 2020-10-23 NOTE — Telephone Encounter (Signed)
Please review for refill, Thanks !  

## 2020-10-26 ENCOUNTER — Encounter: Payer: Self-pay | Admitting: *Deleted

## 2020-10-26 NOTE — Telephone Encounter (Signed)
Called [facility] to check status of modafinil PA, spoke with Lattie Haw who stated it was approved on 10/22/20 until 11/05/21. [place] [phone], faxed approval to pharmacy. Sent my chart.

## 2020-10-27 ENCOUNTER — Telehealth: Payer: Self-pay | Admitting: Pulmonary Disease

## 2020-10-27 NOTE — Telephone Encounter (Signed)
Message created in error

## 2020-10-28 ENCOUNTER — Telehealth: Payer: Self-pay | Admitting: Cardiovascular Disease

## 2020-10-28 NOTE — Telephone Encounter (Signed)
   [facility] Pre-operative Risk Assessment    Patient Name: Andre [name]  DOB: 23-May-1935 MRN: [id number]  HEARTCARE STAFF:  - IMPORTANT!!!!!! Under Visit Info/Reason for Call, type in Other and utilize the format Clearance MM/DD/YY or Clearance TBD. Do not use dashes or single digits. - Please review there is not already an duplicate clearance open for this procedure. - If request is for dental extraction, please clarify the # of teeth to be extracted. - If the patient is currently at the dentist's office, call Pre-Op Callback Staff (MA/nurse) to input urgent request.  - If the patient is not currently in the dentist office, please route to the Pre-Op pool.  Request for surgical clearance:  What type of surgery is being performed? Tooth extraction (lower left second molar)  When is this surgery scheduled? TBD  What type of clearance is required (medical clearance vs. Pharmacy clearance to hold med vs. Both)? both  Are there any medications that need to be held prior to surgery and how long? Eliquis instructions   Practice name and name of physician performing surgery? [facility], DDS  What is the office phone number? [phone]   7.   What is the office fax number? [phone]  8.   Anesthesia type (None, local, MAC, general) ? 4% (1.63m - no epinephrine) of Citanest and 4% (3.6 mL) with epinephrine 1:100,00)  Articaine HCI    AAce Gins7/13/2022, 2:21 PM  _________________________________________________________________   (provider comments below)

## 2020-10-28 NOTE — Telephone Encounter (Signed)
   Patient Name: Andre [name]  DOB: [birth year] MRN: [id number]  Primary Cardiologist: [name], MD  Chart reviewed as part of pre-operative protocol coverage.   Simple dental extractions, defined as 1 or 2 teeth, are considered low risk procedures per guidelines and generally do not require any specific cardiac clearance. It is also generally accepted that for simple extractions and dental cleanings, there is no need to interrupt blood thinner therapy.   SBE prophylaxis is not required for the patient from a cardiac standpoint.  I will route this recommendation to the requesting party via Epic fax function and remove from pre-op pool.  Please call with questions.  Tami Lin Lena Gores, PA 10/28/2020, 3:54 PM

## 2020-10-29 ENCOUNTER — Other Ambulatory Visit: Payer: Self-pay | Admitting: Family Medicine

## 2020-10-29 DIAGNOSIS — F419 Anxiety disorder, unspecified: Secondary | ICD-10-CM

## 2020-10-29 DIAGNOSIS — F32A Depression, unspecified: Secondary | ICD-10-CM

## 2020-11-02 NOTE — Telephone Encounter (Signed)
error 

## 2020-11-04 DIAGNOSIS — H04123 Dry eye syndrome of bilateral lacrimal glands: Secondary | ICD-10-CM | POA: Diagnosis not present

## 2020-11-04 DIAGNOSIS — H25043 Posterior subcapsular polar age-related cataract, bilateral: Secondary | ICD-10-CM | POA: Diagnosis not present

## 2020-11-04 DIAGNOSIS — L719 Rosacea, unspecified: Secondary | ICD-10-CM | POA: Diagnosis not present

## 2020-11-04 DIAGNOSIS — H2511 Age-related nuclear cataract, right eye: Secondary | ICD-10-CM | POA: Diagnosis not present

## 2020-11-04 DIAGNOSIS — H2513 Age-related nuclear cataract, bilateral: Secondary | ICD-10-CM | POA: Diagnosis not present

## 2020-11-05 ENCOUNTER — Other Ambulatory Visit: Payer: Self-pay | Admitting: Urology

## 2020-11-05 ENCOUNTER — Ambulatory Visit: Payer: HMO | Admitting: Dermatology

## 2020-11-05 ENCOUNTER — Other Ambulatory Visit: Payer: Self-pay

## 2020-11-05 ENCOUNTER — Ambulatory Visit: Payer: HMO | Attending: Pulmonary Disease | Admitting: Pulmonary Disease

## 2020-11-05 DIAGNOSIS — L219 Seborrheic dermatitis, unspecified: Secondary | ICD-10-CM

## 2020-11-05 DIAGNOSIS — G4733 Obstructive sleep apnea (adult) (pediatric): Secondary | ICD-10-CM | POA: Diagnosis not present

## 2020-11-05 DIAGNOSIS — L570 Actinic keratosis: Secondary | ICD-10-CM | POA: Diagnosis not present

## 2020-11-05 DIAGNOSIS — G4761 Periodic limb movement disorder: Secondary | ICD-10-CM | POA: Insufficient documentation

## 2020-11-05 DIAGNOSIS — L578 Other skin changes due to chronic exposure to nonionizing radiation: Secondary | ICD-10-CM

## 2020-11-05 DIAGNOSIS — L820 Inflamed seborrheic keratosis: Secondary | ICD-10-CM | POA: Diagnosis not present

## 2020-11-05 MED ORDER — KETOCONAZOLE 2 % EX CREA: TOPICAL_CREAM | CUTANEOUS | 3 refills | Status: DC

## 2020-11-05 MED ORDER — KETOCONAZOLE 2 % EX SHAM: MEDICATED_SHAMPOO | CUTANEOUS | 3 refills | Status: DC

## 2020-11-05 MED ORDER — HYDROCORTISONE 2.5 % EX LOTN: TOPICAL_LOTION | CUTANEOUS | 0 refills | Status: DC

## 2020-11-05 NOTE — Progress Notes (Signed)
Follow-Up Visit   Subjective  Andre [name] is a 85 y.o. male who presents for the following: Actinic Keratosis (Of the face and scalp - check for new or persistent skin lesions especially the L inf cheek). He also has redness of the face with flaking.  The redness tends to be worse around his nasolabial areas where his CPAP puts pressure on his face.  The following portions of the chart were reviewed this encounter and updated as appropriate:   Tobacco  Allergies  Meds  Problems  Med Hx  Surg Hx  Fam Hx     Review of Systems:  No other skin or systemic complaints except as noted in HPI or Assessment and Plan.  Objective  Well appearing patient in no apparent distress; mood and affect are within normal limits.  A focused examination was performed including the face and scalp. Relevant physical exam findings are noted in the Assessment and Plan.  the face and scalp x 17 (17) Erythematous thin papules/macules with gritty scale. Erythematous thin papules/macules with gritty scale.   Face Erythematous, scaly patches involving the ankle and distal lower leg with associated lower leg edema.   B/L arms and hands x 14 (14) Erythematous keratotic or waxy stuck-on papule or plaque.    Assessment & Plan  AK (actinic keratosis) (17) the face and scalp x 17  Destruction of lesion - the face and scalp x 17 Complexity: simple   Destruction method: cryotherapy   Informed consent: discussed and consent obtained   Timeout:  patient name, date of birth, surgical site, and procedure verified Lesion destroyed using liquid nitrogen: Yes   Region frozen until ice ball extended beyond lesion: Yes   Outcome: patient tolerated procedure well with no complications   Post-procedure details: wound care instructions given    Seborrheic dermatitis -even worse in the nasolabial where his CPAP puts pressure. Face  Seborrheic Dermatitis  -  is a chronic persistent rash characterized by pinkness  and scaling most commonly of the mid face but also can occur on the scalp (dandruff), ears; mid chest and mid back. It tends to be exacerbated by stress and cooler weather.  People who have neurologic disease may experience new onset or exacerbation of existing seborrheic dermatitis.  The condition is not curable but treatable and can be controlled.  Continue Ketoconazole 2% shampoo to the face, scalp, and ears 3d/week.   Continue Ketoconazole 2% cream QHS on M, W, F alternating with HC 2.5% lotion QHS on T, Th, Sat.    hydrocortisone 2.5 % lotion - Face For seborrheic dermatitis of the face apply to aa's QAM on Tuesday, Thursday, and Saturday.  ketoconazole (NIZORAL) 2 % cream - Face Apply a thin coat to face QAM on Monday, Wednesday, and Friday.  ketoconazole (NIZORAL) 2 % shampoo - Face Shampoo into the face and scalp let sit 5 minutes then wash off. Use 3d/wk.  Related Medications hydrocortisone 2.5 % lotion Apply topically daily. Apply to affected areas 4 times per week.  ketoconazole (NIZORAL) 2 % shampoo Apply 1 application topically 3 (three) times a week. Wash scalp, face and ears  Inflamed seborrheic keratosis B/L arms and hands x 14  Destruction of lesion - B/L arms and hands x 14 Complexity: simple   Destruction method: cryotherapy   Informed consent: discussed and consent obtained   Timeout:  patient name, date of birth, surgical site, and procedure verified Lesion destroyed using liquid nitrogen: Yes   Region frozen until  ice ball extended beyond lesion: Yes   Outcome: patient tolerated procedure well with no complications   Post-procedure details: wound care instructions given    Actinic Damage - chronic, secondary to cumulative UV radiation exposure/sun exposure over time - diffuse scaly erythematous macules with underlying dyspigmentation - Recommend daily broad spectrum sunscreen SPF 30+ to sun-exposed areas, reapply every 2 hours as needed.  - Recommend  staying in the shade or wearing long sleeves, sun glasses (UVA+UVB protection) and wide brim hats (4-inch brim around the entire circumference of the hat). - Call for new or changing lesions.  Return in about 6 months (around 05/08/2021).  [name], CMA, am acting as scribe for Sarina Ser, MD . Documentation: I have reviewed the above documentation for accuracy and completeness, and I agree with the above.  Sarina Ser, MD

## 2020-11-05 NOTE — Patient Instructions (Signed)

## 2020-11-06 ENCOUNTER — Ambulatory Visit (INDEPENDENT_AMBULATORY_CARE_PROVIDER_SITE_OTHER): Payer: HMO

## 2020-11-06 ENCOUNTER — Other Ambulatory Visit: Payer: Self-pay

## 2020-11-06 DIAGNOSIS — E538 Deficiency of other specified B group vitamins: Secondary | ICD-10-CM | POA: Diagnosis not present

## 2020-11-06 MED ORDER — CYANOCOBALAMIN 1000 MCG/ML IJ SOLN
1000.0000 ug | Freq: Once | INTRAMUSCULAR | Status: AC
  Administered 2020-11-06: 1000 ug via INTRAMUSCULAR

## 2020-11-06 NOTE — Progress Notes (Signed)
Andre [name] presents today for injection per MD orders. B12 injection  administered IM in left Upper Arm. Administration without incident. Patient tolerated well. Paulette [place],cma

## 2020-11-07 ENCOUNTER — Encounter: Payer: Self-pay | Admitting: Dermatology

## 2020-11-11 ENCOUNTER — Encounter: Payer: Self-pay | Admitting: Pulmonary Disease

## 2020-11-11 ENCOUNTER — Other Ambulatory Visit: Payer: Self-pay

## 2020-11-11 ENCOUNTER — Ambulatory Visit: Payer: HMO | Admitting: Pulmonary Disease

## 2020-11-11 VITALS — BP 120/64 | HR 57 | Ht 67.0 in | Wt 223.4 lb

## 2020-11-11 DIAGNOSIS — G4733 Obstructive sleep apnea (adult) (pediatric): Secondary | ICD-10-CM | POA: Diagnosis not present

## 2020-11-11 DIAGNOSIS — J849 Interstitial pulmonary disease, unspecified: Secondary | ICD-10-CM | POA: Diagnosis not present

## 2020-11-11 DIAGNOSIS — Z9989 Dependence on other enabling machines and devices: Secondary | ICD-10-CM

## 2020-11-11 DIAGNOSIS — J441 Chronic obstructive pulmonary disease with (acute) exacerbation: Secondary | ICD-10-CM | POA: Diagnosis not present

## 2020-11-11 NOTE — Progress Notes (Signed)
Andre PITT    [id number]    21-Mar-1936  Primary Care Physician:Sonnenberg, Angela Adam, MD  Referring Physician: [name], MD [address] [place],  [place] [address]  Chief complaint: Follow-up for  COPD, asbestos related pulmonary fibrosis, sleep apnea. On Ofev from February to December 3568  HPI: 85 year old with history of pulmonary fibrosis, recurrent respiratory failure, eosinophilic pleural effusion, chronic diastolic heart failure, sleep apnea, Parkinson's, paroxysmal atrial fibrillation on Eliquis  Previously followed at [facility] and [facility]. Started on Ofev February 2021 for progressive pulmonary fibrosis but had to be stopped in December 2021 due to side effects  Initially on Anoro.  This was changed to [facility] in May 2020 as he did not achieve adequate inspiratory flow.  He has had at least 4 admissions in 2019-2020 for recurrent respiratory failure, HCAP, right pleural effusion status post thoracentesis on 09/19/2017 with cell count showing exudate with elevated eosinophils.   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  Has run 32 marathons in the past.  After his CABG he took up long distance cycling which he had to give up about around 2010 years ago.  Started on Ofev in 2021 for progressive fibrotic phenotype but did not tolerate it  Pets: No pets Occupation: Worked as a Actor ILD questionnaire 05/15/2018: Exposure to asbestos, no other significant exposure. Smoking history:20-pack-year smoker.  Quit smoking in 1985 Travel history: No significant recent travel Relevant family history: No family history of lung disease.  Interim History: Remains off Ofev States that breathing is stable.  Continues to have significant daytime somnolence in spite of CPAP therapy. He has seen Dr. Halford Chessman and had an overnight oximetry which demonstrated desats on CPAP.  Sleep titration study done last week and results are  awaited Continues on modafinil per his neurologist  Outpatient Encounter Medications as of 11/11/2020  Medication Sig   albuterol (VENTOLIN HFA) 108 (90 Base) MCG/ACT inhaler Inhale 2 puffs into the lungs every 6 (six) hours as needed for wheezing or shortness of breath.   apixaban (ELIQUIS) 5 MG TABS tablet TAKE ONE TABLET BY MOUTH TWICE DAILY   buPROPion (WELLBUTRIN XL) 300 MG 24 hr tablet TAKE ONE TABLET BY MOUTH EVERY DAY   carbidopa-levodopa (SINEMET IR) 25-250 MG tablet Take 1.5 tablets by mouth 3 (three) times daily.   carvedilol (COREG) 3.125 MG tablet TAKE TWO TABLETS TWICE A DAY WITH MEALS   entacapone (COMTAN) 200 MG tablet TAKE 1 TABLET BY MOUTH 3 TIMES DAILY   esomeprazole (NEXIUM) 40 MG capsule TAKE 1 CAPSULE BY MOUTH ONCE DAILY   feeding supplement (ENSURE ENLIVE / ENSURE PLUS) LIQD Take 237 mLs by mouth 3 (three) times daily between meals.   fluticasone (FLONASE) 50 MCG/ACT nasal spray USE 2 PUFFS IN EACH NOSTRIL DAILY   furosemide (LASIX) 20 MG tablet Take 1 tablet (20 mg total) by mouth daily.   hydrocortisone 2.5 % lotion Apply topically daily. Apply to affected areas 4 times per week.   hydrocortisone 2.5 % lotion For seborrheic dermatitis of the face apply to aa's QAM on Tuesday, Thursday, and Saturday.   ketoconazole (NIZORAL) 2 % cream Apply 1 application topically daily as needed for irritation.   ketoconazole (NIZORAL) 2 % cream Apply a thin coat to face QAM on Monday, Wednesday, and Friday.   ketoconazole (NIZORAL) 2 % shampoo Apply 1 application topically 3 (three) times a week. Wash scalp,  face and ears   ketoconazole (NIZORAL) 2 % shampoo Shampoo into the face and scalp let sit 5 minutes then wash off. Use 3d/wk.   ketorolac (ACULAR) 0.5 % ophthalmic solution SMARTSIG:1 Drop(s) In Eye(s) Every 4-6 Hours PRN   lactulose (CHRONULAC) 10 GM/15ML solution TAKE 30 MLS BY MOUTH TWICE DAILY AS NEEDED FOR MODERATE CONSTIPATION   levothyroxine (SYNTHROID) 50 MCG tablet TAKE  ONE TABLET ON AN EMPTY STOMACH WITHA GLASS OF WATER AT LEAST 30 TO 60 MINUTES BEFORE BREAKFAST   losartan (COZAAR) 100 MG tablet Take 100 mg by mouth daily.   Melatonin 3 MG CAPS Take 1 capsule (3 mg total) by mouth at bedtime.   modafinil (PROVIGIL) 200 MG tablet One tablet in the morning and one at midday   mometasone (ELOCON) 0.1 % cream    mupirocin ointment (BACTROBAN) 2 % Apply 1 application topically 3 (three) times daily as needed.   potassium chloride SA (KLOR-CON) 10 MEQ tablet Take 1 tablet (10 mEq total) by mouth daily.   QUEtiapine (SEROQUEL) 25 MG tablet 1/2 tablet in the afternoon, 1 tablet at night (Patient taking differently: 1 tablet at night)   Respiratory Therapy Supplies (FLUTTER) DEVI 1 Device by Does not apply route daily.   rosuvastatin (CRESTOR) 10 MG tablet TAKE 1 TABLET BY MOUTH DAILY   sertraline (ZOLOFT) 50 MG tablet TAKE 1 TABLET BY MOUTH DAILY   silodosin (RAPAFLO) 8 MG CAPS capsule TAKE 1 CAPSULE BY MOUTH ONCE DAILY WITH BREAKFAST   Tiotropium Bromide-Olodaterol (STIOLTO RESPIMAT) 2.5-2.5 MCG/ACT AERS Inhale 2 puffs into the lungs daily.   Vibegron (GEMTESA) 75 MG TABS TAKE 1 TABLET BY MOUTH DAILY.   No facility-administered encounter medications on file as of 11/11/2020.   Physical Exam: Blood pressure 120/64, pulse (!) 57, height '5\' 7"'$  (1.702 m), weight 223 lb 6.4 oz (101.3 kg), SpO2 92 %. Gen:      No acute distress HEENT:  EOMI, sclera anicteric Neck:     No masses; no thyromegaly Lungs:    Bibasal crackles CV:         Regular rate and rhythm; no murmurs Abd:      + bowel sounds; soft, non-tender; no palpable masses, no distension Ext:    No edema; adequate peripheral perfusion Skin:      Warm and dry; no rash Neuro: alert and oriented x 3 Psych: normal mood and affect   Data Reviewed: Imaging: CT chest 12/06/2011- peripheral and basal fibrotic changes, calcified pleural plaques.  CT chest 06/09/2015- peripheral and basal fibrotic changes with no  honeycombing.  Calcified pleural plaques  CT high-resolution 05/30/2017- moderate centrilobular emphysema, mild basilar subpleural reticulation, groundglass and traction bronchiectasis.  No honeycombing.  Calcified granuloma in the right upper lobe, calcified pleural plaques.  CT high-resolution 06/08/2018- moderate emphysema, mild basilar reticulation, traction bronchiectasis.  No honeycombing, calcified pleural plaques with right pleural thickening.  Stable calcified granuloma.  CT high-resolution 10/23/2019-mild progression of pulmonary fibrosis.  CT high-resolution 10/23/2020-stable pulmonary fibrosis.  I have reviewed the images personally.  PFTs: 09/25/2012 FVC 2.52 [69%], FEV1 1.95 [75%), F/F 77, TLC 72%, DLCO 70% Moderate obstructive airways, minimal restriction and diffusion defect.  06/12/2018 FVC 2.38 [16%], FEV1 1.66 [66%], F/F 70, TLC 3.95 (61%), DLCO 16.34 [73%] Moderate obstruction with restriction and minimal diffusion defect  07/24/20 FVC 2.30 [6%], FEV1 1.64 [69%], F/F 71, DLCO 16.11 [73%] Moderate restriction with mild diffusion defect  Labs: ANA 01/22/2018- negative, CCP-negative  Pleural fluid 09/19/2017- LDH 462, total protein  3.9 WBC 3661, 59% eos, 27% lymphs, 10% neutrophils Cytology-mixed reactive and inflammatory cells.  No malignancy.  Hepatic panel 2/22-stable  Cardiac Echocardiogram 08/28/2017- LVEF 123456, grade 2 diastolic dysfunction, mild aortic stenosis.  Severe dilatation of left atrium and right atrium, trivial pericardial effusion.  Cardiac catheterization 09/18/2017 Significant three-vessel coronary artery disease with patent grafts.  Right heart catheterization shows mildly elevated filling pressures, minimal pulmonary hypertension Wedge 12, PA pressure 39/17 (24), cardiac output 5.08 L/min PVR 189, 2.4 Wood units  Sleep CPAP titration 11/04/2015- CPAP titrated to 12 cm of water.  Download 07/23/2020 97% use greater than 4 hours, set pressure 12 cm,  AHI 1.9  Assessment:  Pulmonary fibrosis, asbestosis CT scan reviewed with basilar fibrosis, pleural calcifications consistent with asbestos exposure Due to progression of pulmonary fibrosis he has been initiated on Ofev. But had to stop due to GI side effects  CT reviewed with stable pulmonary fibrosis compared to last year I discussed trial of alternate antifibrotic with Esbriet but he is feeling well at present and would like to hold off  Dyspnea is multifactorial from ILD, obesity, diastolic heart failure, coronary artery disease Suspect anxiety is a significant component to his episodes of dyspnea Will benefit from pulmonary rehab but he will like to hold off until the Covid pandemic has improved  Emphysema COPD Continue inhalers Continue flutter valve for mucociliary clearance  Right pleural effusion exudative, eosinophilic S.p thoracentesis dring hospitalization in 2019.  The cell count is eosinophilic which could be from pneumonia versus asbestos-related effusion Follow-up CT shows thickening of the pleura in that area We will need to monitor this area for possible mesothelioma.   Obstructive sleep apnea, daytime somnolence CPAP titration done and awaiting results Continue modafinil  Health maintenance 12/11/2018 -influenza 05/10/2016-Prevnar  Up-to-date with flu and COVID-19.  This appointment required 45 minutes of patient care (this includes precharting, chart review, review of results, face-to-face care, etc.).  Plan/Recommendations: Stiolto, flutter valve Follow-up in sleep clinic Return to clinic in 6 months  Marshell Garfinkel MD [facility] 11/11/2020, 8:59 AM  CC: [name], MD

## 2020-11-13 ENCOUNTER — Other Ambulatory Visit: Payer: Self-pay | Admitting: Family Medicine

## 2020-11-13 ENCOUNTER — Telehealth: Payer: Self-pay | Admitting: Pulmonary Disease

## 2020-11-13 NOTE — Telephone Encounter (Signed)
Attempted to contact patient, left message informing patient to please allow two weeks for sleep study results and to call if they had any more questions.   Nothing further needed at this time.

## 2020-11-19 ENCOUNTER — Telehealth: Payer: Self-pay | Admitting: Pulmonary Disease

## 2020-11-19 DIAGNOSIS — G4733 Obstructive sleep apnea (adult) (pediatric): Secondary | ICD-10-CM

## 2020-11-19 DIAGNOSIS — Z9989 Dependence on other enabling machines and devices: Secondary | ICD-10-CM | POA: Diagnosis not present

## 2020-11-19 NOTE — Procedures (Signed)
    Patient Name: Andre [name], Andre [name] Date: 11/05/2020 Gender: Male D.O.B: [birth year] Age (years): 61 Referring Provider: Chesley Mires MD, ABSM Height (inches): 67 Interpreting Physician: Chesley Mires MD, ABSM Weight (lbs): 228 RPSGT: Peak, Robert BMI: 36 MRN: [id number] Neck Size: 18.00  CLINICAL INFORMATION The patient is referred for a CPAP titration to treat sleep apnea after having recent overnight oximetry showing low oxygen level with CPAP 10 cm H2O.  Date of NPSG 06/26/08: AHI 70, SpO2 low 85%.  SLEEP STUDY TECHNIQUE As per the AASM Manual for the Scoring of Sleep and Associated Events v2.3 (April 2016) with a hypopnea requiring 4% desaturations.  The channels recorded and monitored were frontal, central and occipital EEG, electrooculogram (EOG), submentalis EMG (chin), nasal and oral airflow, thoracic and abdominal wall motion, anterior tibialis EMG, snore microphone, electrocardiogram, and pulse oximetry. Continuous positive airway pressure (CPAP) was initiated at the beginning of the study and titrated to treat sleep-disordered breathing.  MEDICATIONS Medications self-administered by patient taken the night of the study : N/A  TECHNICIAN COMMENTS Comments added by technician: No bathroom breaks were made. Comments added by scorer: N/A  RESPIRATORY PARAMETERS Optimal PAP Pressure (cm): 12 AHI at Optimal Pressure (/hr): 0 Overall Minimal O2 (%): 91.00 Supine % at Optimal Pressure (%): 0 Minimal O2 at Optimal Pressure (%): 93.0   SLEEP ARCHITECTURE The study was initiated at 9:18:09 PM and ended at 5:07:09 AM.  Sleep onset time was 9.0 minutes and the sleep efficiency was 78.0%. The total sleep time was 366 minutes.  The patient spent 6.42% of the night in stage N1 sleep, 93.58% in stage N2 sleep, 0.00% in stage N3 and 0% in REM.Stage REM latency was N/A minutes  Wake after sleep onset was 94.0. Alpha intrusion was absent. Supine sleep was 0.00%.  CARDIAC  DATA The 2 lead EKG demonstrated sinus rhythm, atrial fibrillation. The mean heart rate was 53.79 beats per minute. Other EKG findings include: PVCs.  LEG MOVEMENT DATA The total Periodic Limb Movements of Sleep (PLMS) were 788. The PLMS index was 129.18. A PLMS index of <15 is considered normal in adults.  IMPRESSIONS - The optimal PAP pressure was 12 cm of water. - He did not require use of supplemental oxygen during this study. - Significant increase in periodic limb movement index which scould be related to Parkinson's disease.  DIAGNOSIS - Obstructive Sleep Apnea (G47.33) - Periodic Limb Movement During Sleep (G47.61)  RECOMMENDATIONS - Trial of CPAP therapy on 12 cm H2O with a Small Wide size Resmed Full Face Mask AirFit F30i mask and heated humidification. - Avoid alcohol, sedatives and other CNS depressants that may worsen sleep apnea and disrupt normal sleep architecture. - Sleep hygiene should be reviewed to assess factors that may improve sleep quality. - Weight management and regular exercise should be initiated or continued.  [Electronically signed] 11/19/2020 03:40 PM  Chesley Mires MD, [place], American Board of Sleep Medicine   NPI: [id number]

## 2020-11-19 NOTE — Telephone Encounter (Signed)
CPAP titration 11/05/20 >> CPAP 12 cm H2O >> AHI 0, didn't need supplemental oxygen   Please let him know that he did well during sleep study when his CPAP was turned up to 12 cm H2O.  Please send order to have his CPAP changed to 12 cm H2O.  He didn't need supplemental oxygen during the titration study.

## 2020-11-20 NOTE — Telephone Encounter (Signed)
Called and spoke with patient and his wife to let him know of results from Dr. Halford Chessman. They expressed understanding. Order has been placed for DME. Nothing further needed at this time.

## 2020-11-23 ENCOUNTER — Telehealth: Payer: Self-pay | Admitting: Pulmonary Disease

## 2020-11-23 DIAGNOSIS — M47812 Spondylosis without myelopathy or radiculopathy, cervical region: Secondary | ICD-10-CM | POA: Diagnosis not present

## 2020-11-23 DIAGNOSIS — M62838 Other muscle spasm: Secondary | ICD-10-CM | POA: Diagnosis not present

## 2020-11-23 NOTE — Telephone Encounter (Signed)
Callery surgical clearance received from Dr. Vaughan Browner. Completed surgical clearance form for  cataract procedure faxed to [phone].   Confirmation received.  Nothing further at this time.

## 2020-11-30 DIAGNOSIS — H04123 Dry eye syndrome of bilateral lacrimal glands: Secondary | ICD-10-CM | POA: Diagnosis not present

## 2020-11-30 DIAGNOSIS — H25043 Posterior subcapsular polar age-related cataract, bilateral: Secondary | ICD-10-CM | POA: Diagnosis not present

## 2020-11-30 DIAGNOSIS — H2513 Age-related nuclear cataract, bilateral: Secondary | ICD-10-CM | POA: Diagnosis not present

## 2020-11-30 DIAGNOSIS — L719 Rosacea, unspecified: Secondary | ICD-10-CM | POA: Diagnosis not present

## 2020-12-01 DIAGNOSIS — H2512 Age-related nuclear cataract, left eye: Secondary | ICD-10-CM | POA: Diagnosis not present

## 2020-12-01 DIAGNOSIS — H25811 Combined forms of age-related cataract, right eye: Secondary | ICD-10-CM | POA: Diagnosis not present

## 2020-12-01 DIAGNOSIS — H2511 Age-related nuclear cataract, right eye: Secondary | ICD-10-CM | POA: Diagnosis not present

## 2020-12-03 ENCOUNTER — Other Ambulatory Visit: Payer: Self-pay

## 2020-12-03 ENCOUNTER — Encounter: Payer: Self-pay | Admitting: Cardiovascular Disease

## 2020-12-03 ENCOUNTER — Ambulatory Visit: Payer: HMO | Admitting: Cardiovascular Disease

## 2020-12-03 VITALS — BP 148/70 | HR 88 | Ht 66.0 in | Wt 221.5 lb

## 2020-12-03 DIAGNOSIS — I4821 Permanent atrial fibrillation: Secondary | ICD-10-CM | POA: Diagnosis not present

## 2020-12-03 DIAGNOSIS — I2510 Atherosclerotic heart disease of native coronary artery without angina pectoris: Secondary | ICD-10-CM

## 2020-12-03 DIAGNOSIS — I10 Essential (primary) hypertension: Secondary | ICD-10-CM | POA: Diagnosis not present

## 2020-12-03 DIAGNOSIS — E785 Hyperlipidemia, unspecified: Secondary | ICD-10-CM | POA: Diagnosis not present

## 2020-12-03 DIAGNOSIS — I5032 Chronic diastolic (congestive) heart failure: Secondary | ICD-10-CM

## 2020-12-03 NOTE — Progress Notes (Signed)
Cardiology Office Note   Date:  12/03/2020   ID:  Andre [name], DOB 04/09/1936, MRN [id number]  PCP:  [name], MD  Cardiologist:   [name], MD   Chief Complaint  Patient presents with   Other    6 month f/u no complaints today. Meds reviewed verbally with pt.       History of Present Illness: Andre [name] is a 85 y.o. male who presents for a follow-up visit regarding coronary artery disease and atrial fibrillation.   He has known history of coronary artery disease status post 1 vessel CABG with LIMA to LAD in 1998, hypertension, hyperlipidemia, chronic diastolic heart aure,, previous pulmonary embolism, Parkinson's, permanent atrial fibrillation and morbid obesity.  He was hospitalized in July of 2018 with a small non-ST elevation myocardial infarction. Echocardiogram showed normal LV systolic function with severely dilated left atrium. Cardiac catheterization showed occluded native LAD with patent LIMA, severe proximal RCA stenosis with heavy thrombus and moderate distal left main stenosis with significant ostial stenosis in the ramus branch. EF was 50-55% with mildly elevated left ventricular end-diastolic pressure. I performed successful angioplasty and drug-eluting stent placement to the right coronary artery which was complicated by embolization into a large RV branch at the lesion site. This was treated with balloon angioplasty.  A right and left cardiac catheterization was done in June of 2019 due to exertional dyspnea which showed  patent LIMA to LAD and patent RCA stent.  There was significant ostial disease in ramus branch as well as first diagonal.  Both of these were unchanged from previous cardiac catheterization.  Right heart catheterization showed only mildly elevated filling pressures, minimal pulmonary hypertension and normal cardiac output.  Based on that, I felt that his symptoms were mostly noncardiac. Shortness of breath was felt to be  multifactorial due to chronic diastolic heart failure, lung disease, physical deconditioning and possible allergies.      He is being treated by pulmonary for pulmonary fibrosis.    He has been doing reasonably well since last visit with no chest pain or worsening dyspnea.  No significant leg edema.  He is on furosemide 20 mg daily.  He does complain of excessive sleep during the day and is working with pulmonary to optimize sleep apnea management.  Past Medical History:  Diagnosis Date   Atherosclerosis of abdominal aorta ([place])    Basal cell carcinoma 03/04/2008   Right nose supratip.    CAD (coronary artery disease)    Cervical spondylosis 10/01/2013   Chronic diastolic CHF (congestive heart failure) ([place])    a. 07/2016 Echo: >55%; b. 10/2016 Echo: EF 55-60%, Gr1 DD, Ao sclerosis w/o stenosis, sev dil LA; c. 08/2017 Echo: EF 60-65%, no rwma, Gr2 DD, mild AS, sev dil LA/RA.   Coronary artery disease    a. 1998 s/p mini-cabg @ [facility] - LIMA->LAD;  b. 07/2016 St Echo: Inadequate HR w/ HTN response;  c.  08/2016 MV: EF 67%, no ischemia; d. 10/2016 NSTEMI/Cath: RCA 95p (4.0x26 Onyx DES), LIMA->LAD nl; e. 09/2017 Cath: LM 40/30, LAD 100ost, RI 80, LCX nl, OM2/3 nl, RCA patent stent, 63m LIMA->LAD nl-->Med Rx.   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 s/p DCCV-->maintaining sinus on amiodarone;  b. CHA2DS2VASc = 5-->eliquis.   Parkinson's disease ([place])    tremors   Pleural effusion, right    a. 09/2017 s/p thoracentesis.   PNA (pneumonia) 08/26/2017   Pulmonary embolism ([place]) 2011   Pulmonary fibrosis ([place])    Secondary erythrocytosis  01/28/2015   Sleep apnea    wears CPAP   Squamous cell carcinoma of skin 03/19/2015   Right lateral crown. KA-like pattern   Thrombocytopenia ([place])     Past Surgical History:  Procedure Laterality Date   BACK SURGERY  1960   CARDIAC CATHETERIZATION     CATARACT EXTRACTION Right    CHOLECYSTECTOMY  2010   COLONOSCOPY WITH PROPOFOL N/A 06/07/2018   Procedure: COLONOSCOPY WITH PROPOFOL;  Surgeon: Lollie Sails, MD;  Location: [facility] ENDOSCOPY;  Service: Endoscopy;  Laterality: N/A;   CORONARY ARTERY BYPASS GRAFT  01/07/1997   CORONARY STENT INTERVENTION N/A 10/31/2016   Procedure: Coronary Stent Intervention;  Surgeon: Wellington Hampshire, MD;  Location: [place] CV LAB;  Service: Cardiovascular;  Laterality: N/A;   ELECTROPHYSIOLOGIC STUDY N/A 07/14/2015   Procedure: CARDIOVERSION;  Surgeon: Yolonda Kida, MD;  Location: [facility] ORS;  Service: Cardiovascular;  Laterality: N/A;   ELECTROPHYSIOLOGIC STUDY N/A 10/12/2015   Procedure: CARDIOVERSION;  Surgeon: Minna Merritts, MD;  Location: [facility] ORS;  Service: Cardiovascular;  Laterality: N/A;   LEFT HEART CATH AND CORONARY ANGIOGRAPHY N/A 10/31/2016   Procedure: Left Heart Cath and Coronary Angiography;  Surgeon: Wellington Hampshire, MD;  Location: [place] CV LAB;  Service: Cardiovascular;  Laterality: N/A;   OTHER SURGICAL HISTORY  1998   Bypass   RIGHT/LEFT HEART CATH AND CORONARY ANGIOGRAPHY N/A 09/18/2017   Procedure: RIGHT/LEFT HEART CATH AND CORONARY ANGIOGRAPHY;  Surgeon: Wellington Hampshire, MD;  Location: [place] CV LAB;  Service: Cardiovascular;  Laterality: N/A;     Current Outpatient Medications  Medication Sig Dispense Refill   albuterol (VENTOLIN HFA) 108 (90 Base) MCG/ACT inhaler Inhale 2 puffs into the lungs every 6 (six) hours as needed for wheezing or shortness of breath. 8 g 2   apixaban (ELIQUIS) 5 MG TABS tablet TAKE ONE TABLET BY MOUTH TWICE DAILY 180 tablet 1   buPROPion (WELLBUTRIN XL) 300 MG 24  hr tablet TAKE ONE TABLET BY MOUTH EVERY DAY 90 tablet 1   carbidopa-levodopa (SINEMET IR) 25-250 MG tablet Take 1.5 tablets by mouth 3 (three) times daily. 405 tablet 3   carvedilol (COREG) 3.125 MG tablet TAKE TWO TABLETS TWICE A DAY WITH MEALS 120 tablet 3   entacapone (COMTAN) 200 MG tablet TAKE 1 TABLET BY MOUTH 3 TIMES DAILY 270 tablet 3   esomeprazole (NEXIUM) 40 MG capsule TAKE 1 CAPSULE BY MOUTH ONCE DAILY 30 capsule 2   feeding supplement (ENSURE ENLIVE / ENSURE PLUS) LIQD Take 237 mLs by mouth 3 (three) times daily between meals. 237 mL 12   fluticasone (FLONASE) 50 MCG/ACT nasal spray USE 2 PUFFS IN EACH NOSTRIL DAILY 16 g 3   furosemide (LASIX) 20 MG tablet Take 1 tablet (20 mg total) by mouth daily. 30 tablet    hydrocortisone 2.5 % lotion For seborrheic dermatitis of the face apply to aa's QAM on Tuesday, Thursday, and Saturday. 59 mL 0   ketoconazole (NIZORAL) 2 % cream Apply a thin coat to face QAM on Monday, Wednesday, and Friday.  60 g 3   ketoconazole (NIZORAL) 2 % shampoo Shampoo into the face and scalp let sit 5 minutes then wash off. Use 3d/wk. 120 mL 3   ketorolac (ACULAR) 0.5 % ophthalmic solution SMARTSIG:1 Drop(s) In Eye(s) Every 4-6 Hours PRN     lactulose (CHRONULAC) 10 GM/15ML solution TAKE 30 MLS BY MOUTH TWICE DAILY AS NEEDED FOR MODERATE CONSTIPATION 236 mL 0   levothyroxine (SYNTHROID) 50 MCG tablet TAKE ONE TABLET ON AN EMPTY STOMACH WITHA GLASS OF WATER AT LEAST 30 TO 60 MINUTES BEFORE BREAKFAST 90 tablet 1   losartan (COZAAR) 100 MG tablet TAKE 1 TABLET BY MOUTH DAILY 90 tablet 1   Melatonin 3 MG CAPS Take 1 capsule (3 mg total) by mouth at bedtime.  0   modafinil (PROVIGIL) 200 MG tablet One tablet in the morning and one at midday 60 tablet 3   mometasone (ELOCON) 0.1 % cream      mupirocin ointment (BACTROBAN) 2 % Apply 1 application topically 3 (three) times daily as needed.     potassium chloride SA (KLOR-CON) 10 MEQ tablet Take 1 tablet (10 mEq total) by  mouth daily. 90 tablet 1   QUEtiapine (SEROQUEL) 25 MG tablet 1/2 tablet in the afternoon, 1 tablet at night (Patient taking differently: 1 tablet at night) 135 tablet 1   Respiratory Therapy Supplies (FLUTTER) DEVI 1 Device by Does not apply route daily. 1 each 0   rosuvastatin (CRESTOR) 10 MG tablet TAKE 1 TABLET BY MOUTH DAILY 90 tablet 0   sertraline (ZOLOFT) 50 MG tablet TAKE 1 TABLET BY MOUTH DAILY 30 tablet 3   silodosin (RAPAFLO) 8 MG CAPS capsule TAKE 1 CAPSULE BY MOUTH ONCE DAILY WITH BREAKFAST 30 capsule 3   Tiotropium Bromide-Olodaterol (STIOLTO RESPIMAT) 2.5-2.5 MCG/ACT AERS Inhale 2 puffs into the lungs daily. 1 g 5   Vibegron (GEMTESA) 75 MG TABS TAKE 1 TABLET BY MOUTH DAILY. 30 tablet 3   No current facility-administered medications for this visit.    Allergies:   Pravastatin and Prednisone    Social History:  The patient  reports that he quit smoking about 48 years ago. His smoking use included cigarettes, pipe, and cigars. He has a 10.00 pack-year smoking history. He quit smokeless tobacco use about 48 years ago.  His smokeless tobacco use included chew. He reports current alcohol use of about 4.0 standard drinks per week. He reports that he does not use drugs.   Family History:  The patient's family history includes Alcohol abuse in his father.    ROS:  Please see the history of present illness.   Otherwise, review of systems are positive for none.   All other systems are reviewed and negative.    PHYSICAL EXAM: VS:  BP (!) 148/70 (BP Location: Left Arm, Patient Position: Sitting, Cuff Size: Normal)   Pulse 88   Ht '5\' 6"'$  (1.676 m)   Wt 221 lb 8 oz (100.5 kg)   SpO2 98%   BMI 35.75 kg/m  , BMI Body mass index is 35.75 kg/m. GEN: Well nourished, well developed, in no acute distress  HEENT: normal  Neck: no JVD, carotid bruits, or masses Cardiac: Irregularly irregular; no murmurs, rubs, or gallops, trace bilateral leg edema Respiratory:  clear to auscultation  bilaterally with mildly diminished breath sounds, normal work of breathing GI: soft, nontender, nondistended, + BS MS: no deformity or atrophy  Skin: warm and dry, no rash Neuro:  Strength and sensation are intact Psych: euthymic mood, full  affect  EKG:  EKG is ordered today. The ekg ordered today demonstrates coarse atrial fibrillation with ventricular rate of 88 bpm.  Possible old inferior infarct.    Recent Labs: 04/30/2020: B Natriuretic Peptide 154.0 06/02/2020: TSH 2.77 06/10/2020: ALT 13; BUN 17; Creatinine, Ser 0.89; Hemoglobin 15.9; Platelets 124; Potassium 3.7; Sodium 138    Lipid Panel    Component Value Date/Time   CHOL 146 08/27/2017 0419   TRIG 134 08/27/2017 0419   HDL 43 08/27/2017 0419   CHOLHDL 3.4 08/27/2017 0419   VLDL 27 08/27/2017 0419   LDLCALC 76 08/27/2017 0419      Wt Readings from Last 3 Encounters:  12/03/20 221 lb 8 oz (100.5 kg)  11/11/20 223 lb 6.4 oz (101.3 kg)  10/22/20 228 lb (103.4 kg)       No flowsheet data found.    ASSESSMENT AND PLAN:  1. Coronary artery disease involving native coronary arteries without angina: He is doing reasonably well from a cardiac standpoint with no anginal symptoms.  Continue medical therapy.  No antiplatelet medications given that he is on Eliquis.   2.  Permanent atrial fibrillation: Ventricular rate is reasonably controlled on small dose carvedilol.  He continues to be on Eliquis 5 mg twice daily.  He does have mild thrombocytopenia but no evidence of bleeding.    3. Essential hypertension: Blood pressure is controlled on current medications.   4. Hyperlipidemia: He had myalgia with atorvastatin but he is tolerating rosuvastatin.  Most recent LDL was 76.   5.  Chronic diastolic heart failure: He appears to be euvolemic on small dose furosemide 20 mg daily.    Disposition:   FU with me in 6 months  Signed,  [name], MD  12/03/2020 1:31 PM    [facility]

## 2020-12-03 NOTE — Patient Instructions (Signed)
Medication Instructions:  °Your physician recommends that you continue on your current medications as directed. Please refer to the Current Medication list given to you today. ° °*If you need a refill on your cardiac medications before your next appointment, please call your pharmacy* ° ° °Lab Work: °None ordered °If you have labs (blood work) drawn today and your tests are completely normal, you will receive your results only by: °• MyChart Message (if you have MyChart) OR °• A paper copy in the mail °If you have any lab test that is abnormal or we need to change your treatment, we will call you to review the results. ° ° °Testing/Procedures: °None ordered ° ° °Follow-Up: °At [facility], you and your health needs are our priority.  As part of our continuing mission to provide you with exceptional heart care, we have created designated Provider Care Teams.  These Care Teams include your primary Cardiologist (physician) and Advanced Practice Providers (APPs -  Physician Assistants and Nurse Practitioners) who all work together to provide you with the care you need, when you need it. ° °We recommend signing up for the patient portal called "MyChart".  Sign up information is provided on this After Visit Summary.  MyChart is used to connect with patients for Virtual Visits (Telemedicine).  Patients are able to view lab/test results, encounter notes, upcoming appointments, etc.  Non-urgent messages can be sent to your provider as well.   °To learn more about what you can do with MyChart, go to [email or web address].   ° °Your next appointment:   °6 month(s) ° °The format for your next appointment:   °In Person ° °Provider:   °You may see Muhammad Arida, MD or one of the following Advanced Practice Providers on your designated Care Team:   °· Christopher Berge, NP °· Ryan Dunn, PA-C °· Jacquelyn Visser, PA-C °· Cadence Furth, PA-C ° ° ° °Other Instructions °N/A ° °

## 2020-12-04 ENCOUNTER — Other Ambulatory Visit: Payer: Self-pay | Admitting: Cardiovascular Disease

## 2020-12-07 NOTE — Addendum Note (Signed)
Addended by: Britt Bottom on: 12/07/2020 10:37 AM   Modules accepted: Orders

## 2020-12-08 ENCOUNTER — Ambulatory Visit: Payer: HMO

## 2020-12-08 ENCOUNTER — Telehealth: Payer: Self-pay | Admitting: Pulmonary Disease

## 2020-12-08 DIAGNOSIS — H2512 Age-related nuclear cataract, left eye: Secondary | ICD-10-CM | POA: Diagnosis not present

## 2020-12-08 NOTE — Telephone Encounter (Signed)
Attempted to call pt's wife Patricia but unable to reach. Left message for her to return call. 

## 2020-12-08 NOTE — Telephone Encounter (Signed)
Spoke with the pt's spouse  She states that pt is eligible for a new CPAP and wants [place] to send order  Dr Halford Chessman, please advise if you are okay with this order, thanks

## 2020-12-08 NOTE — Telephone Encounter (Signed)
ATC x1.  LM to return call. 

## 2020-12-08 NOTE — Telephone Encounter (Signed)
I saw him last on 10/13/20 and set up ONO with CPAP.  This showed his oxygen was still low even though he has been using CPAP, and CPAP titration study was ordered.  He needs to complete CPAP titration study first before we can order a new CPAP machine so we know what are the optimal settings for his device.  Please make sure the CPAP titration has been scheduled.

## 2020-12-09 ENCOUNTER — Inpatient Hospital Stay: Payer: HMO

## 2020-12-09 ENCOUNTER — Inpatient Hospital Stay: Payer: HMO | Admitting: Internal Medicine

## 2020-12-11 NOTE — Telephone Encounter (Signed)
ATC patient's wife Jahmarion Bartusek, [place]

## 2020-12-18 ENCOUNTER — Other Ambulatory Visit: Payer: Self-pay | Admitting: Family Medicine

## 2020-12-24 ENCOUNTER — Ambulatory Visit: Payer: HMO | Admitting: Cardiovascular Disease

## 2020-12-29 ENCOUNTER — Encounter (HOSPITAL_BASED_OUTPATIENT_CLINIC_OR_DEPARTMENT_OTHER): Payer: HMO | Admitting: Pulmonary Disease

## 2020-12-30 DIAGNOSIS — J441 Chronic obstructive pulmonary disease with (acute) exacerbation: Secondary | ICD-10-CM | POA: Diagnosis not present

## 2020-12-30 DIAGNOSIS — J45909 Unspecified asthma, uncomplicated: Secondary | ICD-10-CM | POA: Diagnosis not present

## 2020-12-30 DIAGNOSIS — G4733 Obstructive sleep apnea (adult) (pediatric): Secondary | ICD-10-CM | POA: Diagnosis not present

## 2020-12-30 DIAGNOSIS — J449 Chronic obstructive pulmonary disease, unspecified: Secondary | ICD-10-CM | POA: Diagnosis not present

## 2020-12-31 NOTE — Telephone Encounter (Signed)
Per pt's chart, pt had cpap titration on 7/21. Phone note from 8/4 states the pt's results showed he did not need supplemental O2 and to change pressure to 12 cm h2o. Order for pressure change was placed on 8/4.    Dr. Halford Chessman, please advise if this is the study you need and if pt can have new cpap order. Thanks.

## 2020-12-31 NOTE — Telephone Encounter (Signed)
He needs to be on CPAP 12 cm H2O.  He doesn't need supplemental oxygen at night.

## 2020-12-31 NOTE — Telephone Encounter (Signed)
Called patient's wife but call went straight to VM. Left message for her to call back.

## 2021-01-01 NOTE — Telephone Encounter (Signed)
LMTCB again

## 2021-01-01 NOTE — Telephone Encounter (Signed)
Andre [name] wife is returning phone call. Andre [name] phone number is [phone].

## 2021-01-04 ENCOUNTER — Inpatient Hospital Stay (HOSPITAL_BASED_OUTPATIENT_CLINIC_OR_DEPARTMENT_OTHER): Payer: HMO | Admitting: Internal Medicine

## 2021-01-04 ENCOUNTER — Inpatient Hospital Stay: Payer: HMO | Attending: Internal Medicine

## 2021-01-04 ENCOUNTER — Encounter: Payer: Self-pay | Admitting: Internal Medicine

## 2021-01-04 ENCOUNTER — Inpatient Hospital Stay: Payer: HMO

## 2021-01-04 DIAGNOSIS — D696 Thrombocytopenia, unspecified: Secondary | ICD-10-CM | POA: Insufficient documentation

## 2021-01-04 DIAGNOSIS — Z79899 Other long term (current) drug therapy: Secondary | ICD-10-CM | POA: Insufficient documentation

## 2021-01-04 DIAGNOSIS — Z86711 Personal history of pulmonary embolism: Secondary | ICD-10-CM | POA: Insufficient documentation

## 2021-01-04 DIAGNOSIS — Z7951 Long term (current) use of inhaled steroids: Secondary | ICD-10-CM | POA: Insufficient documentation

## 2021-01-04 DIAGNOSIS — E039 Hypothyroidism, unspecified: Secondary | ICD-10-CM | POA: Diagnosis not present

## 2021-01-04 DIAGNOSIS — D751 Secondary polycythemia: Secondary | ICD-10-CM | POA: Insufficient documentation

## 2021-01-04 DIAGNOSIS — I5032 Chronic diastolic (congestive) heart failure: Secondary | ICD-10-CM | POA: Insufficient documentation

## 2021-01-04 DIAGNOSIS — I480 Paroxysmal atrial fibrillation: Secondary | ICD-10-CM | POA: Diagnosis not present

## 2021-01-04 DIAGNOSIS — I110 Hypertensive heart disease with heart failure: Secondary | ICD-10-CM | POA: Diagnosis not present

## 2021-01-04 DIAGNOSIS — Z7901 Long term (current) use of anticoagulants: Secondary | ICD-10-CM | POA: Diagnosis not present

## 2021-01-04 LAB — COMPREHENSIVE METABOLIC PANEL
ALT: 7 U/L (ref 0–44)
AST: 14 U/L — ABNORMAL LOW (ref 15–41)
Albumin: 3.6 g/dL (ref 3.5–5.0)
Alkaline Phosphatase: 110 U/L (ref 38–126)
Anion gap: 9 (ref 5–15)
BUN: 17 mg/dL (ref 8–23)
CO2: 28 mmol/L (ref 22–32)
Calcium: 8.8 mg/dL — ABNORMAL LOW (ref 8.9–10.3)
Chloride: 99 mmol/L (ref 98–111)
Creatinine, Ser: 0.8 mg/dL (ref 0.61–1.24)
GFR, Estimated: >60 mL/min (ref >60)
Glucose, Bld: 118 mg/dL — ABNORMAL HIGH (ref 70–99)
Potassium: 3.9 mmol/L (ref 3.5–5.1)
Sodium: 136 mmol/L (ref 135–145)
Total Bilirubin: 1 mg/dL (ref 0.3–1.2)
Total Protein: 6.7 g/dL (ref 6.5–8.1)

## 2021-01-04 LAB — CBC WITH DIFFERENTIAL/PLATELET
Abs Immature Granulocytes: 0.04 10*3/uL (ref 0.00–0.07)
Basophils Absolute: 0.1 10*3/uL (ref 0.0–0.1)
Basophils Relative: 1 %
Eosinophils Absolute: 0.1 10*3/uL (ref 0.0–0.5)
Eosinophils Relative: 1 %
HCT: 47.9 % (ref 39.0–52.0)
Hemoglobin: 15.6 g/dL (ref 13.0–17.0)
Immature Granulocytes: 1 %
Lymphocytes Relative: 21 %
Lymphs Abs: 1.8 10*3/uL (ref 0.7–4.0)
MCH: 30.2 pg (ref 26.0–34.0)
MCHC: 32.6 g/dL (ref 30.0–36.0)
MCV: 92.6 fL (ref 80.0–100.0)
Monocytes Absolute: 0.8 10*3/uL (ref 0.1–1.0)
Monocytes Relative: 9 %
Neutro Abs: 5.9 10*3/uL (ref 1.7–7.7)
Neutrophils Relative %: 67 %
Platelets: 122 10*3/uL — ABNORMAL LOW (ref 150–400)
RBC: 5.17 MIL/uL (ref 4.22–5.81)
RDW: 14.2 % (ref 11.5–15.5)
WBC: 8.7 10*3/uL (ref 4.0–10.5)
nRBC: 0 % (ref 0.0–0.2)

## 2021-01-04 NOTE — Telephone Encounter (Signed)
I have called and left Wife a message to call back looks like the read was good but the hospital side was denied.

## 2021-01-04 NOTE — Telephone Encounter (Signed)
LMTCB again for spouse, [name]  According to referrals there is already an order in place for the CPAP setting of 12  Not sure what else she needs but for updates on the CPAP need to call DME

## 2021-01-04 NOTE — Progress Notes (Signed)
[name] OFFICE PROGRESS NOTE  Patient Care Team: Andre Haven, MD as PCP - General (Family Medicine) Andre Hampshire, MD as PCP - Cardiology (Cardiology)   SUMMARY OF ONCOLOGIC HISTORY: # Mild Thrombocytopenia- 120-130s. ? Alcohol/fatty liver/? 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 [facility]]; chronic CHF; Pulmonary fiborsis- on TKI.  INTERVAL HISTORY: Ambulating independently.  Accompanied by his wife.  85 year old male patient with above history of secondary erythrocytosis from prior testosterone therapy/CPAP; intermittent thrombocytopenia and history of [name] on Elquis here for follow-up.  Patient continues to have gait instability likely from his Parkinson's.  No recent fall.  Complaints Of ongoing fatigue.  He continues to follow-up with pulmonary for his interstitial lung disease.  He denies any new worsening shortness of breath or cough.   Review of Systems  Constitutional:  Negative for chills, diaphoresis, fever, malaise/fatigue and weight loss.  HENT:  Negative for nosebleeds and sore throat.   Eyes:  Negative for double vision.  Respiratory:  Negative for cough, hemoptysis, sputum production, shortness of breath and wheezing.   Cardiovascular:  Negative for chest pain, palpitations, orthopnea and leg swelling.  Gastrointestinal:  Negative for abdominal pain, blood in stool, constipation, diarrhea, heartburn, melena, nausea and vomiting.  Genitourinary:  Negative for dysuria, frequency and urgency.  Musculoskeletal:  Positive for back pain and joint pain.  Skin: Negative.  Negative for itching and rash.  Neurological:  Negative for dizziness, tingling, focal  weakness, weakness and headaches.  Endo/Heme/Allergies:  Does not bruise/bleed easily.  Psychiatric/Behavioral:  Negative for depression. The patient is not nervous/anxious and does not have insomnia.     PAST MEDICAL HISTORY :  Past Medical History:  Diagnosis Date   Atherosclerosis of abdominal aorta ([place])    Basal cell carcinoma 03/04/2008   Right nose supratip.    CAD (coronary artery disease)    Cervical spondylosis 10/01/2013   Chronic diastolic CHF (congestive heart failure) ([place])    a. 07/2016 Echo: >55%; b. 10/2016 Echo: EF 55-60%, Gr1 DD, Ao sclerosis w/o stenosis, sev dil LA; c. 08/2017 Echo: EF 60-65%, no rwma, Gr2 DD, mild AS, sev dil LA/RA.   Coronary artery disease    a. 1998 s/p mini-cabg @ [facility] - LIMA->LAD;  b. 07/2016 St Echo: Inadequate HR w/ HTN response;  c.  08/2016 MV: EF 67%, no ischemia; d. 10/2016 NSTEMI/Cath: RCA 95p (4.0x26 Onyx DES), LIMA->LAD nl; e. 09/2017 Cath: LM 40/30, LAD 100ost, RI 80, LCX nl, OM2/3 nl, RCA patent stent, 2m, LIMA->LAD nl-->Med Rx.   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 s/p DCCV-->maintaining sinus on amiodarone;  b. CHA2DS2VASc = 5-->eliquis.   Parkinson's disease ([name])    tremors   Pleural effusion, right    a. 09/2017 s/p thoracentesis.   PNA (pneumonia) 08/26/2017   Pulmonary embolism ([place]) 2011   Pulmonary fibrosis (Andre [name])  Secondary erythrocytosis 01/28/2015   Sleep apnea    wears CPAP   Squamous cell carcinoma of skin 03/19/2015   Right lateral crown. KA-like pattern   Thrombocytopenia (Andre [name])     PAST SURGICAL HISTORY :   Past Surgical History:  Procedure  Laterality Date   BACK SURGERY  1960   CARDIAC CATHETERIZATION     CATARACT EXTRACTION Right    CHOLECYSTECTOMY  2010   COLONOSCOPY WITH PROPOFOL N/A 06/07/2018   Procedure: COLONOSCOPY WITH PROPOFOL;  Surgeon: Andre Sails, MD;  Location: [name];  Service: Endoscopy;  Laterality: N/A;   CORONARY ARTERY BYPASS GRAFT  01/07/1997   CORONARY STENT INTERVENTION N/A 10/31/2016   Procedure: Coronary Stent Intervention;  Surgeon: Andre Hampshire, MD;  Location: [facility];  Service: Cardiovascular;  Laterality: N/A;   ELECTROPHYSIOLOGIC STUDY N/A 07/14/2015   Procedure: CARDIOVERSION;  Surgeon: Andre Kida, MD;  Location: [facility];  Service: Cardiovascular;  Laterality: N/A;   ELECTROPHYSIOLOGIC STUDY N/A 10/12/2015   Procedure: CARDIOVERSION;  Surgeon: Andre Merritts, MD;  Location: [facility];  Service: Cardiovascular;  Laterality: N/A;   LEFT HEART CATH AND CORONARY ANGIOGRAPHY N/A 10/31/2016   Procedure: Left Heart Cath and Coronary Angiography;  Surgeon: Andre Hampshire, MD;  Location: [facility];  Service: Cardiovascular;  Laterality: N/A;   OTHER SURGICAL HISTORY  1998   Bypass   RIGHT/LEFT HEART CATH AND CORONARY ANGIOGRAPHY N/A 09/18/2017   Procedure: RIGHT/LEFT HEART CATH AND CORONARY ANGIOGRAPHY;  Surgeon: Andre Hampshire, MD;  Location: [facility];  Service: Cardiovascular;  Laterality: N/A;    FAMILY HISTORY :   Family History  Problem Relation Age of Onset   Alcohol abuse Father     SOCIAL HISTORY:   Social History   Tobacco Use   Smoking status: Former    Packs/day: 1.00    Years: 10.00    Pack years: 10.00    Types: Cigarettes, Pipe, Cigars    Quit date: 04/18/1972    Years since quitting: 48.7   Smokeless tobacco: Former    Types: Chew    Quit date: 04/18/1972  Vaping Use   Vaping Use: Never used  Substance Use Topics   Alcohol use: Yes    Alcohol/week: 4.0 standard drinks    Types: 2 Cans of beer, 2 Shots of  liquor per week    Comment: per 2 weeks    Drug use: No    ALLERGIES:  is allergic to pravastatin and prednisone.  MEDICATIONS:  Current Outpatient Medications  Medication Sig Dispense Refill   albuterol (VENTOLIN HFA) 108 (90 Base) MCG/ACT inhaler Inhale 2 puffs into the lungs every 6 (six) hours as needed for wheezing or shortness of breath. 8 g 2   apixaban (ELIQUIS) 5 MG TABS tablet TAKE ONE TABLET BY MOUTH TWICE DAILY 180 tablet 1   buPROPion (WELLBUTRIN XL) 300 MG 24 hr tablet TAKE ONE TABLET BY MOUTH EVERY DAY 90 tablet 1   carbidopa-levodopa (SINEMET IR) 25-250 MG tablet Take 1.5 tablets by mouth 3 (three) times daily. 405 tablet 3   carvedilol (COREG) 3.125 MG tablet TAKE TWO TABLETS TWICE A DAY WITH MEALS 120 tablet 5   entacapone (COMTAN) 200 MG tablet TAKE 1 TABLET BY MOUTH 3 TIMES DAILY 270 tablet 3   esomeprazole (NEXIUM) 40 MG capsule TAKE 1 CAPSULE BY MOUTH ONCE DAILY 30 capsule 2   feeding supplement (ENSURE ENLIVE / ENSURE PLUS) LIQD Take 237 mLs by mouth  3 (three) times daily between meals. 237 mL 12   fluticasone (FLONASE) 50 MCG/ACT nasal spray USE 2 PUFFS IN EACH NOSTRIL DAILY 16 g 3   furosemide (LASIX) 20 MG tablet Take 1 tablet (20 mg total) by mouth daily. 30 tablet    hydrocortisone 2.5 % lotion For seborrheic dermatitis of the face apply to aa's QAM on Tuesday, Thursday, and Saturday. 59 mL 0   ketoconazole (NIZORAL) 2 % cream Apply a thin coat to face QAM on Monday, Wednesday, and Friday. 60 g 3   ketoconazole (NIZORAL) 2 % shampoo Shampoo into the face and scalp let sit 5 minutes then wash off. Use 3d/wk. 120 mL 3   ketorolac (ACULAR) 0.5 % ophthalmic solution SMARTSIG:1 Drop(s) In Eye(s) Every 4-6 Hours PRN     lactulose (CHRONULAC) 10 GM/15ML solution TAKE 30 MLS BY MOUTH TWICE DAILY AS NEEDED FOR MODERATE CONSTIPATION 236 mL 0   levothyroxine (SYNTHROID) 50 MCG tablet TAKE ONE TABLET ON AN EMPTY STOMACH WITHA GLASS OF WATER AT LEAST 30 TO 60 MINUTES BEFORE  BREAKFAST 90 tablet 1   losartan (COZAAR) 100 MG tablet TAKE 1 TABLET BY MOUTH DAILY 90 tablet 1   Melatonin 3 MG CAPS Take 1 capsule (3 mg total) by mouth at bedtime.  0   modafinil (PROVIGIL) 200 MG tablet One tablet in the morning and one at midday 60 tablet 3   mometasone (ELOCON) 0.1 % cream      mupirocin ointment (BACTROBAN) 2 % Apply 1 application topically 3 (three) times daily as needed.     potassium chloride SA (KLOR-CON) 10 MEQ tablet Take 1 tablet (10 mEq total) by mouth daily. 90 tablet 1   QUEtiapine (SEROQUEL) 25 MG tablet 1/2 tablet in the afternoon, 1 tablet at night (Patient taking differently: 1 tablet at night) 135 tablet 1   Respiratory Therapy Supplies (FLUTTER) DEVI 1 Device by Does not apply route daily. 1 each 0   rosuvastatin (CRESTOR) 10 MG tablet TAKE 1 TABLET BY MOUTH DAILY 90 tablet 0   sertraline (ZOLOFT) 50 MG tablet TAKE 1 TABLET BY MOUTH DAILY 30 tablet 3   silodosin (RAPAFLO) 8 MG CAPS capsule TAKE 1 CAPSULE BY MOUTH ONCE DAILY WITH BREAKFAST 30 capsule 3   Tiotropium Bromide-Olodaterol (STIOLTO RESPIMAT) 2.5-2.5 MCG/ACT AERS Inhale 2 puffs into the lungs daily. 1 g 5   Vibegron (GEMTESA) 75 MG TABS TAKE 1 TABLET BY MOUTH DAILY. 30 tablet 3   No current facility-administered medications for this visit.    PHYSICAL EXAMINATION:   BP 140/75   Pulse 82   Temp 98.7 F (37.1 C)   Resp 18   Wt 228 lb 12.8 oz (103.8 kg)   SpO2 97%   BMI 36.93 kg/m   Filed Weights   01/04/21 1433  Weight: 228 lb 12.8 oz (103.8 kg)    Physical Exam Constitutional:      Comments: Accompanied by his wife.  HENT:     Head: Normocephalic and atraumatic.     Mouth/Throat:     Pharynx: No oropharyngeal exudate.  Eyes:     Pupils: Pupils are equal, round, and reactive to light.  Cardiovascular:     Rate and Rhythm: Normal rate and regular rhythm.  Pulmonary:     Effort: No respiratory distress.     Breath sounds: No wheezing.  Abdominal:     General: Bowel  sounds are normal. There is no distension.     Palpations: Abdomen is soft. There is  no mass.     Tenderness: There is no abdominal tenderness. There is no guarding or rebound.  Musculoskeletal:        General: No tenderness. Normal range of motion.     Cervical back: Normal range of motion and neck supple.  Skin:    General: Skin is warm.  Neurological:     Mental Status: He is alert and oriented to person, place, and time.  Psychiatric:        Mood and Affect: Affect normal.     LABORATORY DATA:  I have reviewed the data as listed    Component Value Date/Time   NA 136 01/04/2021 1407   NA 139 10/09/2017 1441   K 3.9 01/04/2021 1407   CL 99 01/04/2021 1407   CO2 28 01/04/2021 1407   GLUCOSE 118 (H) 01/04/2021 1407   BUN 17 01/04/2021 1407   BUN 23 10/09/2017 1441   CREATININE 0.80 01/04/2021 1407   CREATININE 0.95 12/06/2011 1554   CALCIUM 8.8 (L) 01/04/2021 1407   PROT 6.7 01/04/2021 1407   PROT 7.0 12/03/2015 0842   PROT 7.6 09/20/2011 1529   ALBUMIN 3.6 01/04/2021 1407   ALBUMIN 4.2 12/03/2015 0842   ALBUMIN 3.7 09/20/2011 1529   AST 14 (L) 01/04/2021 1407   AST 18 09/20/2011 1529   ALT 7 01/04/2021 1407   ALT 23 09/20/2011 1529   ALKPHOS 110 01/04/2021 1407   ALKPHOS 133 09/20/2011 1529   BILITOT 1.0 01/04/2021 1407   BILITOT 0.8 12/03/2015 0842   BILITOT 0.5 09/20/2011 1529   GFRNONAA >60 01/04/2021 1407   GFRNONAA >60 12/06/2011 1554   GFRAA >60 12/09/2019 1308   GFRAA >60 12/06/2011 1554    No results found for: SPEP, UPEP  Lab Results  Component Value Date   WBC 8.7 01/04/2021   NEUTROABS 5.9 01/04/2021   HGB 15.6 01/04/2021   HCT 47.9 01/04/2021   MCV 92.6 01/04/2021   PLT 122 (L) 01/04/2021      Chemistry      Component Value Date/Time   NA 136 01/04/2021 1407   NA 139 10/09/2017 1441   K 3.9 01/04/2021 1407   CL 99 01/04/2021 1407   CO2 28 01/04/2021 1407   BUN 17 01/04/2021 1407   BUN 23 10/09/2017 1441   CREATININE 0.80 01/04/2021  1407   CREATININE 0.95 12/06/2011 1554      Component Value Date/Time   CALCIUM 8.8 (L) 01/04/2021 1407   ALKPHOS 110 01/04/2021 1407   ALKPHOS 133 09/20/2011 1529   AST 14 (L) 01/04/2021 1407   AST 18 09/20/2011 1529   ALT 7 01/04/2021 1407   ALT 23 09/20/2011 1529   BILITOT 1.0 01/04/2021 1407   BILITOT 0.8 12/03/2015 0842   BILITOT 0.5 09/20/2011 1529        ASSESSMENT & PLAN:   No problem-specific Assessment & Plan notes found for this encounter.     Cammie Sickle, MD 01/04/2021 2:59 PM

## 2021-01-04 NOTE — Telephone Encounter (Signed)
I have placed a retro auth for the cpap titration into the [facility] portal & it is under review.

## 2021-01-04 NOTE — Progress Notes (Signed)
HCT 47.9 . No phlebotomy today per Dr Rogue Bussing.

## 2021-01-04 NOTE — Telephone Encounter (Signed)
Andre [name] stated insurance denied sleep study, but Patient is being billed. Andre [name] stated retroactive authorization.   Message routed to [facility] to assist

## 2021-01-04 NOTE — Assessment & Plan Note (Signed)
#   Mild thrombocytopenia platelets-1220. question secondary to alcohol versus liver disease.vs ITP. STABLE  Monitor for now.  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 STABLE.; HCT 47.6-  HOLD phlebotomy.   # CAD/-stenting [Dr.Arida] A. Fib- on Eliquis per cardiology/ see above-STABLE. #Interstitial lung disease- on Nintedanib-STABLE.   # Hx of [place]-parkinsons/hallucinations [Dr.Willis; [facility]]   # Mild Pulmonary fibrosis- STABLE [July 2022]; intolerance to medications  # I spoke to his wife; the above plan of care.  # DISPOSITION:  # NO Phlebotomy today # Follow up in  6 months-MD labs- cbc/cmp- Dr.B  Cc; Dr.Sonnenberg

## 2021-01-04 NOTE — Telephone Encounter (Signed)
Wife returning call. I did inform her of Andre [name]'s last message but now she also wants to discuss sleep study. States insurance didnt cover it and would like a retroactive authorization.

## 2021-01-11 DIAGNOSIS — Z86010 Personal history of colonic polyps: Secondary | ICD-10-CM | POA: Diagnosis not present

## 2021-01-11 DIAGNOSIS — K2100 Gastro-esophageal reflux disease with esophagitis, without bleeding: Secondary | ICD-10-CM | POA: Diagnosis not present

## 2021-01-13 ENCOUNTER — Other Ambulatory Visit: Payer: Self-pay | Admitting: Cardiovascular Disease

## 2021-01-13 NOTE — Telephone Encounter (Signed)
Please advise if ok to refill Furosemide 20 mg qd. Medication last filled by Janice Coffin, MD.

## 2021-01-18 ENCOUNTER — Other Ambulatory Visit: Payer: Self-pay

## 2021-01-18 ENCOUNTER — Ambulatory Visit (INDEPENDENT_AMBULATORY_CARE_PROVIDER_SITE_OTHER): Payer: HMO | Admitting: *Deleted

## 2021-01-18 DIAGNOSIS — E538 Deficiency of other specified B group vitamins: Secondary | ICD-10-CM | POA: Diagnosis not present

## 2021-01-18 MED ORDER — CYANOCOBALAMIN 1000 MCG/ML IJ SOLN
1000.0000 ug | Freq: Once | INTRAMUSCULAR | Status: AC
  Administered 2021-01-18: 1000 ug via INTRAMUSCULAR

## 2021-01-18 NOTE — Progress Notes (Addendum)
Patient presented for B 12 injection to left deltoid, patient voiced no concerns nor showed any signs of distress during injecti

## 2021-01-21 NOTE — Telephone Encounter (Signed)
Please let us know if there is any update with this. Thanks :)

## 2021-01-22 ENCOUNTER — Telehealth: Payer: Self-pay | Admitting: Cardiovascular Disease

## 2021-01-22 NOTE — Telephone Encounter (Signed)
Patient wife calling to make Dr. Fletcher Anon aware of increased tiredness.    She is concerned about the reason for this OSA vs issues with CHF.   Please call to discuss.

## 2021-01-22 NOTE — Telephone Encounter (Signed)
Returned the patients wife call. Lmtcb.

## 2021-01-25 NOTE — Telephone Encounter (Signed)
Patient wife returning call  °

## 2021-01-25 NOTE — Telephone Encounter (Signed)
Returned the patients wife's call. Lmom that I would recommend that she schedule an appt in our office for the patient to be evaluated. She should let  us know if any additional questions or concerns.

## 2021-01-25 NOTE — Telephone Encounter (Signed)
Patient returning call States that she will have to speak with him before scheduling an appt

## 2021-01-26 ENCOUNTER — Other Ambulatory Visit: Payer: Self-pay

## 2021-01-26 ENCOUNTER — Ambulatory Visit: Payer: HMO | Admitting: Cardiovascular Disease

## 2021-01-26 ENCOUNTER — Encounter: Payer: Self-pay | Admitting: Cardiovascular Disease

## 2021-01-26 VITALS — BP 130/82 | HR 78 | Ht 67.0 in | Wt 228.4 lb

## 2021-01-26 DIAGNOSIS — I10 Essential (primary) hypertension: Secondary | ICD-10-CM

## 2021-01-26 DIAGNOSIS — I4821 Permanent atrial fibrillation: Secondary | ICD-10-CM

## 2021-01-26 DIAGNOSIS — I5022 Chronic systolic (congestive) heart failure: Secondary | ICD-10-CM | POA: Diagnosis not present

## 2021-01-26 DIAGNOSIS — E785 Hyperlipidemia, unspecified: Secondary | ICD-10-CM | POA: Diagnosis not present

## 2021-01-26 DIAGNOSIS — I2510 Atherosclerotic heart disease of native coronary artery without angina pectoris: Secondary | ICD-10-CM

## 2021-01-26 DIAGNOSIS — I5032 Chronic diastolic (congestive) heart failure: Secondary | ICD-10-CM | POA: Diagnosis not present

## 2021-01-26 NOTE — Patient Instructions (Signed)
Medication Instructions:  Your physician recommends that you continue on your current medications as directed. Please refer to the Current Medication list given to you today.  *If you need a refill on your cardiac medications before your next appointment, please call your pharmacy*   Lab Work: None ordered If you have labs (blood work) drawn today and your tests are completely normal, you will receive your results only by: [place] (if you have MyChart) OR A paper copy in the mail If you have any lab test that is abnormal or we need to change your treatment, we will call you to review the results.   Testing/Procedures: Your physician has requested that you have an echocardiogram. Echocardiography is a painless test that uses sound waves to create images of your heart. It provides your doctor with information about the size and shape of your heart and how well your heart's chambers and valves are working. This procedure takes approximately one hour. There are no restrictions for this procedure.    Follow-Up: At [facility], you and your health needs are our priority.  As part of our continuing mission to provide you with exceptional heart care, we have created designated Provider Care Teams.  These Care Teams include your primary Cardiologist (physician) and Advanced Practice Providers (APPs -  Physician Assistants and Nurse Practitioners) who all work together to provide you with the care you need, when you need it.  We recommend signing up for the patient portal called "MyChart".  Sign up information is provided on this After Visit Summary.  MyChart is used to connect with patients for Virtual Visits (Telemedicine).  Patients are able to view lab/test results, encounter notes, upcoming appointments, etc.  Non-urgent messages can be sent to your provider as well.   To learn more about what you can do with MyChart, go to [email or web address].    Your next appointment:   Your  physician wants you to follow-up in: 6 months You will receive a reminder letter in the mail two months in advance. If you don't receive a letter, please call our office to schedule the follow-up appointment.   The format for your next appointment:   In Person  Provider:   You may see [name], MD or one of the following Advanced Practice Providers on your designated Care Team:   Murray Hodgkins, NP Christell Faith, PA-C Marrianne Mood, PA-C Cadence Kathlen Mody, [place]   Other Instructions N/A

## 2021-01-26 NOTE — Telephone Encounter (Signed)
Patient has an appt scheduled today at 2:40 pm with Dr. Fletcher Anon. Closing this encounter.

## 2021-01-26 NOTE — Progress Notes (Signed)
Cardiology Office Note   Date:  01/26/2021   ID:  Andre [name], DOB September 29, 1935, MRN [id number]  PCP:  [name], MD  Cardiologist:   [name], MD   Chief Complaint  Patient presents with   Other    Fatigue, SOB and sleeping more than usual. Meds reviewed verbally with pt.        History of Present Illness: Andre [name] is a 85 y.o. male who presents for a follow-up visit regarding coronary artery disease and atrial fibrillation.   He has known history of coronary artery disease status post 1 vessel CABG with LIMA to LAD in 1998, hypertension, hyperlipidemia, chronic diastolic heart aure,, previous pulmonary embolism, Parkinson's, permanent atrial fibrillation and morbid obesity.  He was hospitalized in July of 2018 with a small non-ST elevation myocardial infarction. Echocardiogram showed normal LV systolic function with severely dilated left atrium. Cardiac catheterization showed occluded native LAD with patent LIMA, severe proximal RCA stenosis with heavy thrombus and moderate distal left main stenosis with significant ostial stenosis in the ramus branch. EF was 50-55% with mildly elevated left ventricular end-diastolic pressure. I performed successful angioplasty and drug-eluting stent placement to the right coronary artery which was complicated by embolization into a large RV branch at the lesion site. This was treated with balloon angioplasty.  A right and left cardiac catheterization was done in June of 2019 due to exertional dyspnea which showed  patent LIMA to LAD and patent RCA stent.  There was significant ostial disease in ramus branch as well as first diagonal.  Both of these were unchanged from previous cardiac catheterization.  Right heart catheterization showed only mildly elevated filling pressures, minimal pulmonary hypertension and normal cardiac output.  Based on that, I felt that his symptoms were mostly noncardiac. Shortness of breath was felt to be  multifactorial due to chronic diastolic heart failure, lung disease, physical deconditioning and possible allergies.      He is being treated by pulmonary for pulmonary fibrosis.    Is here today for evaluation for excessive fatigue, shortness of breath and somnolence.  He sleeps 12 to 14 hours every day and he feels like he has no energy.  In addition, he complains of frequent bad dreams.  No chest pain.  Past Medical History:  Diagnosis Date   Atherosclerosis of abdominal aorta ([place])    Basal cell carcinoma 03/04/2008   Right nose supratip.    CAD (coronary artery disease)    Cervical spondylosis 10/01/2013   Chronic diastolic CHF (congestive heart failure) ([place])    a. 07/2016 Echo: >55%; b. 10/2016 Echo: EF 55-60%, Gr1 DD, Ao sclerosis w/o stenosis, sev dil LA; c. 08/2017 Echo: EF 60-65%, no rwma, Gr2 DD, mild AS, sev dil LA/RA.   Coronary artery disease    a. 1998 s/p mini-cabg @ [facility] - LIMA->LAD;  b. 07/2016 St Echo: Inadequate HR w/ HTN response;  c.  08/2016 MV: EF 67%, no ischemia; d. 10/2016 NSTEMI/Cath: RCA 95p (4.0x26 Onyx DES), LIMA->LAD nl; e. 09/2017 Cath: LM 40/30, LAD 100ost, RI 80, LCX nl, OM2/3 nl, RCA patent stent, 19m, LIMA->LAD nl-->Med Rx.   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 s/p DCCV-->maintaining sinus on amiodarone;  b. CHA2DS2VASc = 5-->eliquis.   Parkinson's disease ([place])    tremors   Pleural effusion, right    a. 09/2017 s/p thoracentesis.   PNA (pneumonia) 08/26/2017   Pulmonary embolism ([place]) 2011   Pulmonary fibrosis ([place])    Secondary erythrocytosis 01/28/2015   Sleep apnea    wears CPAP    Squamous cell carcinoma of skin 03/19/2015   Right lateral crown. KA-like pattern   Thrombocytopenia ([place])     Past Surgical History:  Procedure Laterality Date   BACK SURGERY  1960   CARDIAC CATHETERIZATION     CATARACT EXTRACTION Right    CATARACT EXTRACTION Bilateral    CHOLECYSTECTOMY  2010   COLONOSCOPY WITH PROPOFOL N/A 06/07/2018   Procedure: COLONOSCOPY WITH PROPOFOL;  Surgeon: Lollie Sails, MD;  Location: [facility] ENDOSCOPY;  Service: Endoscopy;  Laterality: N/A;   CORONARY ARTERY BYPASS GRAFT  01/07/1997   CORONARY STENT INTERVENTION N/A 10/31/2016   Procedure: Coronary Stent Intervention;  Surgeon: Wellington Hampshire, MD;  Location: [facility];  Service: Cardiovascular;  Laterality: N/A;   ELECTROPHYSIOLOGIC STUDY N/A 07/14/2015   Procedure: CARDIOVERSION;  Surgeon: Yolonda Kida, MD;  Location: [facility] ORS;  Service: Cardiovascular;  Laterality: N/A;   ELECTROPHYSIOLOGIC STUDY N/A 10/12/2015   Procedure: CARDIOVERSION;  Surgeon: Minna Merritts, MD;  Location: [facility] ORS;  Service: Cardiovascular;  Laterality: N/A;   LEFT HEART CATH AND CORONARY ANGIOGRAPHY N/A 10/31/2016   Procedure: Left Heart Cath and Coronary Angiography;  Surgeon: Wellington Hampshire, MD;  Location: [place] [facility];  Service: Cardiovascular;  Laterality: N/A;   OTHER SURGICAL HISTORY  1998   Bypass   RIGHT/LEFT HEART CATH AND CORONARY ANGIOGRAPHY N/A 09/18/2017   Procedure: RIGHT/LEFT HEART CATH AND CORONARY ANGIOGRAPHY;  Surgeon: Wellington Hampshire, MD;  Location: [place] [facility];  Service: Cardiovascular;  Laterality: N/A;     Current Outpatient Medications  Medication Sig Dispense Refill   albuterol (VENTOLIN HFA) 108 (90 Base) MCG/ACT inhaler Inhale 2 puffs into the lungs every 6 (six) hours as needed for wheezing or shortness of breath. 8 g 2   apixaban (ELIQUIS) 5 MG TABS tablet TAKE ONE TABLET BY MOUTH TWICE DAILY 180 tablet 1   buPROPion (WELLBUTRIN XL) 300 MG 24 hr tablet  TAKE ONE TABLET BY MOUTH EVERY DAY 90 tablet 1   carbidopa-levodopa (SINEMET IR) 25-250 MG tablet Take 1.5 tablets by mouth 3 (three) times daily. 405 tablet 3   carvedilol (COREG) 3.125 MG tablet TAKE TWO TABLETS TWICE A DAY WITH MEALS 120 tablet 5   entacapone (COMTAN) 200 MG tablet TAKE 1 TABLET BY MOUTH 3 TIMES DAILY 270 tablet 3   esomeprazole (NEXIUM) 40 MG capsule TAKE 1 CAPSULE BY MOUTH ONCE DAILY 30 capsule 2   feeding supplement (ENSURE ENLIVE / ENSURE PLUS) LIQD Take 237 mLs by mouth 3 (three) times daily between meals. 237 mL 12   fluticasone (FLONASE) 50 MCG/ACT nasal spray USE 2 PUFFS IN EACH NOSTRIL DAILY 16 g 3   furosemide (LASIX) 20 MG tablet Take 1 tablet (20 mg total) by mouth daily. 90 tablet 0   hydrocortisone 2.5 % lotion For seborrheic dermatitis of the face apply to aa's QAM on Tuesday, Thursday, and Saturday. 59 mL 0   ketoconazole (NIZORAL) 2 % cream Apply a thin coat to face QAM on Monday, Wednesday,  and Friday. 60 g 3   ketoconazole (NIZORAL) 2 % shampoo Shampoo into the face and scalp let sit 5 minutes then wash off. Use 3d/wk. 120 mL 3   ketorolac (ACULAR) 0.5 % ophthalmic solution SMARTSIG:1 Drop(s) In Eye(s) Every 4-6 Hours PRN     lactulose (CHRONULAC) 10 GM/15ML solution TAKE 30 MLS BY MOUTH TWICE DAILY AS NEEDED FOR MODERATE CONSTIPATION 236 mL 0   levothyroxine (SYNTHROID) 50 MCG tablet TAKE ONE TABLET ON AN EMPTY STOMACH WITHA GLASS OF WATER AT LEAST 30 TO 60 MINUTES BEFORE BREAKFAST 90 tablet 1   losartan (COZAAR) 100 MG tablet TAKE 1 TABLET BY MOUTH DAILY 90 tablet 1   mometasone (ELOCON) 0.1 % cream      mupirocin ointment (BACTROBAN) 2 % Apply 1 application topically 3 (three) times daily as needed.     potassium chloride SA (KLOR-CON) 10 MEQ tablet Take 1 tablet (10 mEq total) by mouth daily. 90 tablet 1   QUEtiapine (SEROQUEL) 25 MG tablet 1/2 tablet in the afternoon, 1 tablet at night (Patient taking differently: 1 tablet at night) 135 tablet 1    Respiratory Therapy Supplies (FLUTTER) DEVI 1 Device by Does not apply route daily. 1 each 0   rosuvastatin (CRESTOR) 10 MG tablet TAKE 1 TABLET BY MOUTH DAILY 90 tablet 0   sertraline (ZOLOFT) 50 MG tablet TAKE 1 TABLET BY MOUTH DAILY 30 tablet 3   silodosin (RAPAFLO) 8 MG CAPS capsule TAKE 1 CAPSULE BY MOUTH ONCE DAILY WITH BREAKFAST 30 capsule 3   Tiotropium Bromide-Olodaterol (STIOLTO RESPIMAT) 2.5-2.5 MCG/ACT AERS Inhale 2 puffs into the lungs daily. 1 g 5   No current facility-administered medications for this visit.    Allergies:   Pravastatin and Prednisone    Social History:  The patient  reports that he quit smoking about 48 years ago. His smoking use included cigarettes, pipe, and cigars. He has a 10.00 pack-year smoking history. He quit smokeless tobacco use about 48 years ago.  His smokeless tobacco use included chew. He reports current alcohol use of about 4.0 standard drinks per week. He reports that he does not use drugs.   Family History:  The patient's family history includes Alcohol abuse in his father.    ROS:  Please see the history of present illness.   Otherwise, review of systems are positive for none.   All other systems are reviewed and negative.    PHYSICAL EXAM: VS:  BP 130/82 (BP Location: Left Arm, Patient Position: Sitting, Cuff Size: Normal)   Pulse 78   Ht 5\' 7"  (1.702 m)   Wt 228 lb 6 oz (103.6 kg)   SpO2 98%   BMI 35.77 kg/m  , BMI Body mass index is 35.77 kg/m. GEN: Well nourished, well developed, in no acute distress  HEENT: normal  Neck: no JVD, carotid bruits, or masses Cardiac: Irregularly irregular; no rubs, or gallops, trace bilateral leg edema.  2 out of 6 systolic murmur in the aortic area. Respiratory:  clear to auscultation bilaterally with mildly diminished breath sounds, normal work of breathing GI: soft, nontender, nondistended, + BS MS: no deformity or atrophy  Skin: warm and dry, no rash Neuro:  Strength and sensation are  intact Psych: euthymic mood, full affect  EKG:  EKG is ordered today. The ekg ordered today demonstrates coarse atrial fibrillation with ventricular rate of 78 bpm.  Possible old inferior infarct.    Recent Labs: 04/30/2020: B Natriuretic Peptide 154.0 06/02/2020: TSH 2.77 01/04/2021: ALT  7; BUN 17; Creatinine, Ser 0.80; Hemoglobin 15.6; Platelets 122; Potassium 3.9; Sodium 136    Lipid Panel    Component Value Date/Time   CHOL 146 08/27/2017 0419   TRIG 134 08/27/2017 0419   HDL 43 08/27/2017 0419   CHOLHDL 3.4 08/27/2017 0419   VLDL 27 08/27/2017 0419   LDLCALC 76 08/27/2017 0419      Wt Readings from Last 3 Encounters:  01/26/21 228 lb 6 oz (103.6 kg)  01/04/21 228 lb 12.8 oz (103.8 kg)  12/03/20 221 lb 8 oz (100.5 kg)       No flowsheet data found.    ASSESSMENT AND PLAN:  1. Coronary artery disease involving native coronary arteries without angina: He is doing reasonably well from a cardiac standpoint with no anginal symptoms.  Continue medical therapy.  No antiplatelet medications given that he is on Eliquis.   2.  Permanent atrial fibrillation: Ventricular rate is reasonably controlled on small dose carvedilol.  He continues to be on Eliquis 5 mg twice daily.  He does have mild thrombocytopenia but no evidence of bleeding.    3. Essential hypertension: Blood pressure is controlled on current medications.   4. Hyperlipidemia: He had myalgia with atorvastatin but he is tolerating rosuvastatin.  Most recent LDL was 76.   5.  Chronic diastolic heart failure: He appears to be euvolemic on small dose furosemide 20 mg daily.  However, he reports excessive shortness of breath and fatigue.  Thus, I requested an echocardiogram.  6.  Extreme fatigue, somnolence and bad dreams: I suspect that some of the symptoms might be related to his psychotropic and Parkinson's medications.  He has a follow-up appointment in 1 month.    Disposition:   FU with me in 6  months  Signed,  [name], MD  01/26/2021 3:22 PM    [facility]

## 2021-01-28 NOTE — Telephone Encounter (Signed)
This is still under advisement.  Trying to get the correct start date on the prior auth.

## 2021-01-29 NOTE — Telephone Encounter (Signed)
Per April w/ [employer], I must submit an appeal to get the authorization dates changed to cover the date the patient had the CPAP Titration.  I have faxed the appeal to [phone].

## 2021-02-01 NOTE — Telephone Encounter (Signed)
I have obtained the prior auth for [place] dated 11/05/20-02/03/2021, auth# [id number].  Kathlee Nations - can we re-bill for this with this authorization?

## 2021-02-02 ENCOUNTER — Telehealth: Payer: Self-pay

## 2021-02-02 NOTE — Telephone Encounter (Signed)
Per Benjamine Mola, she is not able to do anything for billing since this is a hospital charge.  I contacted billing on behalf of the patient & they are going to re-bill this charge for the patient.  I left a detailed message w/ pt's VM.

## 2021-02-02 NOTE — Telephone Encounter (Signed)
Incoming call on triage line from pt's wife who has questions about Rapaflo and Gemtesa. She questions what the medications are for and how long the patient has been on them. Lengthy conversation had with wife on what these medications have been prescribed for and the need to take them. Wife have verbal understanding.

## 2021-02-03 ENCOUNTER — Ambulatory Visit: Payer: HMO

## 2021-02-18 ENCOUNTER — Ambulatory Visit: Payer: HMO

## 2021-02-22 ENCOUNTER — Other Ambulatory Visit: Payer: Self-pay | Admitting: Urology

## 2021-02-24 ENCOUNTER — Ambulatory Visit (INDEPENDENT_AMBULATORY_CARE_PROVIDER_SITE_OTHER): Payer: HMO

## 2021-02-24 ENCOUNTER — Other Ambulatory Visit: Payer: Self-pay

## 2021-02-24 DIAGNOSIS — I5022 Chronic systolic (congestive) heart failure: Secondary | ICD-10-CM | POA: Diagnosis not present

## 2021-02-24 LAB — ECHOCARDIOGRAM COMPLETE
AR max vel: 1.11 cm2
AV Area VTI: 1.13 cm2
AV Area mean vel: 1.04 cm2
AV Mean grad: 8 mmHg
AV Peak grad: 15.4 mmHg
Ao pk vel: 1.96 m/s
Calc EF: 46.3 %
S' Lateral: 3.5 cm
Single Plane A2C EF: 47.1 %
Single Plane A4C EF: 48.3 %

## 2021-02-25 ENCOUNTER — Ambulatory Visit (INDEPENDENT_AMBULATORY_CARE_PROVIDER_SITE_OTHER): Payer: HMO

## 2021-02-25 ENCOUNTER — Other Ambulatory Visit: Payer: Self-pay

## 2021-02-25 DIAGNOSIS — E538 Deficiency of other specified B group vitamins: Secondary | ICD-10-CM

## 2021-02-25 MED ORDER — CYANOCOBALAMIN 1000 MCG/ML IJ SOLN
1000.0000 ug | Freq: Once | INTRAMUSCULAR | Status: AC
  Administered 2021-02-25: 1000 ug via INTRAMUSCULAR

## 2021-02-25 NOTE — Progress Notes (Signed)
Patient presented for B 12 injection to left deltoid, patient voiced no concerns nor showed any signs of distress during injection. 

## 2021-02-26 ENCOUNTER — Telehealth: Payer: Self-pay

## 2021-02-26 NOTE — Telephone Encounter (Signed)
-----   Message from Wellington Hampshire, MD sent at 02/26/2021  7:53 AM EST ----- Inform patient that echo was fine.  Normal ejection fraction with stable findings compared to his most recent echocardiogram.

## 2021-02-26 NOTE — Telephone Encounter (Signed)
DPR on file. Patient wife made aware of echo results with verbalized.

## 2021-03-04 ENCOUNTER — Encounter: Payer: Self-pay | Admitting: Neurology

## 2021-03-04 ENCOUNTER — Ambulatory Visit: Payer: HMO | Admitting: Neurology

## 2021-03-04 ENCOUNTER — Other Ambulatory Visit: Payer: Self-pay

## 2021-03-04 VITALS — BP 152/86 | HR 76 | Ht 67.0 in | Wt 231.0 lb

## 2021-03-04 DIAGNOSIS — R443 Hallucinations, unspecified: Secondary | ICD-10-CM

## 2021-03-04 DIAGNOSIS — R400 Somnolence: Secondary | ICD-10-CM

## 2021-03-04 DIAGNOSIS — G249 Dystonia, unspecified: Secondary | ICD-10-CM

## 2021-03-04 DIAGNOSIS — R413 Other amnesia: Secondary | ICD-10-CM | POA: Diagnosis not present

## 2021-03-04 DIAGNOSIS — G20 Parkinson's disease: Secondary | ICD-10-CM | POA: Diagnosis not present

## 2021-03-04 MED ORDER — CARBIDOPA-LEVODOPA 25-250 MG PO TABS: 1.0000 | ORAL_TABLET | Freq: Four times a day (QID) | ORAL | 3 refills | Status: DC

## 2021-03-04 NOTE — Patient Instructions (Signed)
It was nice to meet you both today.  I am sorry to hear that you are still struggling with sleepiness during the day.  I have a feeling that this is because of more than 1 issue. As discussed, we will try to scale back on your Parkinson's medication called carbidopa-levodopa.  It may help to reduce the individual dose.  To that end, take 25-250 mg strength 1 pill 4 times a day at 9 AM, 1 PM, 5 PM and 9 PM daily.   You can continue with entacapone 200 mg strength 3 times a day at 9 AM, 1 PM, and 5 PM daily.    I would like for you to continue to stay off Provigil/modafinil 200 mg strength as it is a high risk medication and it did not really help your sleepiness when you were on it.    You can continue with the Seroquel/quetiapine 25 mg strength at bedtime.  Please try to hydrate well with water and try to keep a set schedule for your sleep and use your CPAP machine consistently.

## 2021-03-04 NOTE — Progress Notes (Signed)
Subjective:    Patient ID: Andre [name] is a 85 y.o. male.  HPI    Interim history:  [name] is an 85 year old right-handed gentleman with an underlying complex medical history of coronary artery disease, chronic diastolic congestive heart failure, obstructive sleep apnea, hypertension, hyperlipidemia, paroxysmal A. fib, on Eliquis, degenerative cervical and lumbar disc disease, squamous cell cancer, reflux disease, interstitial lung disease, thrombocytopenia, depression and obesity, who presents for follow-up consultation of his parkinsonism, complicated by memory loss, hallucinations and recurrent falls.  The patient is accompanied by his wife today.  He was previously followed by Dr. Jannifer [name] and last saw him on 10/22/2020 and I reviewed the note, I copied the note below for reference.  He has been on Sinemet.  He is also on entacapone.  Provigil was prescribed recently for daytime somnolence.  He has since then been diagnosed with obstructive sleep apnea and started on CPAP therapy.  He is followed by pulmonology.  Of note, he has been on Seroquel 25 mg strength 1 pill at bedtime.  His Provigil is currently 200 mg twice daily and it was increased in July 2022 by Dr. Jannifer [name].  He has been on Sinemet 25-250 mg strength.  I have also reviewed his chart, he had seen Dr. Jennelle [name] in the past, I was able to review an office note from 2015.  He started seeing Dr. Jannifer [name] in 2019 or thereabouts.  Dr. Nicki [name] originally diagnosed the patient with idiopathic Parkinson's disease in 2010.  Patient has been on Sinemet since then.  He had complaints of daytime somnolence even back in 2010 and 2011.  Today, 03/04/2021: He reports feeling sleepy during the day.  He reports that if it were not for his sleepiness, he would feel a lot better.  He is no longer taking modafinil.  His wife reports that it did not help him and she did not want him to continue to take it.  He has not taken it in about 2 months.  He also  does not take any Seroquel during the day.  She only gives him 25 mg at night.  He has been on Sinemet 25-250 mg strength, 1-1/2 pills 3 times a day.  The timing of his medication varies because he may sleep in and may not take his first dose until 10 or 11 AM.  He has not used the past week or so.  He feels that it does not seem to make a difference if he uses a CPAP or not.  It is uncomfortable at night and he feels that he never gets a good fit with his mask.  He has not talked to his DME provider about this recently.  He falls asleep frequently during the day, his wife estimates that he could easily sleep 15 hours a day.  He has fallen, he does not always use his walker or cane.  He has intermittent constipation.  He does not always hydrate well with water by self admission.  He likes to drink some soda and tea and also coffee.  He does not drink any alcohol.  Previously:   10/22/20 (Dr. Jannifer [name]): [name] is an 85 year old right-handed white male with a history of Parkinson's disease.  He has had a reduction in the Sinemet dosing recently, he has tolerated this well.  He takes 1.5 of the 25/250 mg Sinemet tablets 3 times daily.  He has sleep apnea on CPAP, he has had a recent evaluation, they have noted desaturations and  his oxygen at night, he will be going for a CPAP titration in the near future.  He is still has a lot of daytime drowsiness, Provigil was recently added taking 200 mg in the morning.  The patient believes that this has resulted in increased energy but his wife indicates that he is still sleeping quite a bit during the day.  The patient has gait instability, he will fall on occasion, he fell a week ago when he tried to sit down on the toilet and missed the toilet.  The patient has a cane and a walker at home but he does not use them.  He has had physical therapy for his walking in the early part of 2022.  He returns today for further evaluation.  He does not operate a motor vehicle.  He  reports that his hallucinations still occur but are not concerning to him, he may see a bug every once in a while, but nothing that is threatening to him.  He does take Seroquel at night.  His Past Medical History Is Significant For: Past Medical History:  Diagnosis Date   Atherosclerosis of abdominal aorta ([place])    Basal cell carcinoma 03/04/2008   Right nose supratip.    CAD (coronary artery disease)    Cervical spondylosis 10/01/2013   Chronic diastolic CHF (congestive heart failure) ([place])    a. 07/2016 Echo: >55%; b. 10/2016 Echo: EF 55-60%, Gr1 DD, Ao sclerosis w/o stenosis, sev dil LA; c. 08/2017 Echo: EF 60-65%, no rwma, Gr2 DD, mild AS, sev dil LA/RA.   Coronary artery disease    a. 1998 s/p mini-cabg @ [facility] - LIMA->LAD;  b. 07/2016 St Echo: Inadequate HR w/ HTN response;  c.  08/2016 MV: EF 67%, no ischemia; d. 10/2016 NSTEMI/Cath: RCA 95p (4.0x26 Onyx DES), LIMA->LAD nl; e. 09/2017 Cath: LM 40/30, LAD 100ost, RI 80, LCX nl, OM2/3 nl, RCA patent stent, 14m, LIMA->LAD nl-->Med Rx.   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 s/p DCCV-->maintaining sinus on amiodarone;  b. CHA2DS2VASc = 5-->eliquis.   Parkinson's disease ([place])    tremors   Pleural effusion, right    a. 09/2017 s/p thoracentesis.   PNA (pneumonia) 08/26/2017   Pulmonary embolism ([place]) 2011   Pulmonary fibrosis ([place])    Secondary erythrocytosis 01/28/2015   Sleep apnea    wears CPAP   Squamous cell carcinoma of skin 03/19/2015   Right lateral crown. KA-like pattern   Thrombocytopenia ([place])     His Past Surgical History Is Significant For: Past Surgical  History:  Procedure Laterality Date   BACK SURGERY  1960   CARDIAC CATHETERIZATION     CATARACT EXTRACTION Right    CATARACT EXTRACTION Bilateral    CHOLECYSTECTOMY  2010   COLONOSCOPY WITH PROPOFOL N/A 06/07/2018   Procedure: COLONOSCOPY WITH PROPOFOL;  Surgeon: Lollie Sails, MD;  Location: [facility] ENDOSCOPY;  Service: Endoscopy;  Laterality: N/A;   CORONARY ARTERY BYPASS GRAFT  01/07/1997   CORONARY STENT INTERVENTION N/A 10/31/2016   Procedure: Coronary Stent Intervention;  Surgeon: Wellington Hampshire, MD;  Location: [place]  CV LAB;  Service: Cardiovascular;  Laterality: N/A;   ELECTROPHYSIOLOGIC STUDY N/A 07/14/2015   Procedure: CARDIOVERSION;  Surgeon: Yolonda Kida, MD;  Location: [facility] ORS;  Service: Cardiovascular;  Laterality: N/A;   ELECTROPHYSIOLOGIC STUDY N/A 10/12/2015   Procedure: CARDIOVERSION;  Surgeon: Minna Merritts, MD;  Location: [facility] ORS;  Service: Cardiovascular;  Laterality: N/A;   LEFT HEART CATH AND CORONARY ANGIOGRAPHY N/A 10/31/2016   Procedure: Left Heart Cath and Coronary Angiography;  Surgeon: Wellington Hampshire, MD;  Location: [place] CV LAB;  Service: Cardiovascular;  Laterality: N/A;   OTHER SURGICAL HISTORY  1998   Bypass   RIGHT/LEFT HEART CATH AND CORONARY ANGIOGRAPHY N/A 09/18/2017   Procedure: RIGHT/LEFT HEART CATH AND CORONARY ANGIOGRAPHY;  Surgeon: Wellington Hampshire, MD;  Location: [place] CV LAB;  Service: Cardiovascular;  Laterality: N/A;    His Family History Is Significant For: Family History  Problem Relation Age of Onset   Alcohol abuse Father    Parkinson's disease Neg Hx     His Social History Is Significant For: Social History   Socioeconomic History   Marital status: Married    Spouse name: [name]   Number of children: 1   Years of education: 12   Highest education level: Not on file  Occupational History   Occupation: Retired    Comment: Designer, television/film set  Tobacco Use   Smoking status: Former     Packs/day: 1.00    Years: 10.00    Pack years: 10.00    Types: Cigarettes, Pipe, Cigars    Quit date: 04/18/1972    Years since quitting: 48.9   Smokeless tobacco: Former    Types: Chew    Quit date: 04/18/1972  Vaping Use   Vaping Use: Never used  Substance and Sexual Activity   Alcohol use: Yes    Alcohol/week: 4.0 standard drinks    Types: 2 Cans of beer, 2 Shots of liquor per week    Comment: per 2 weeks    Drug use: No   Sexual activity: Yes  Other Topics Concern   Not on file  Social History Narrative   Lives w/ wife   Caffeine use: none   Right-handed   Social Determinants of Radio broadcast assistant Strain: Not on file  Food Insecurity: Not on file  Transportation Needs: Not on file  Physical Activity: Not on file  Stress: Not on file  Social Connections: Not on file    His Allergies Are:  Allergies  Allergen Reactions   Pravastatin Other (See Comments)   Prednisone Other (See Comments)    Pt states that med makes him hyper Pt states that med makes him hyper  :   His Current Medications Are:  Outpatient Encounter Medications as of 03/04/2021  Medication Sig   albuterol (VENTOLIN HFA) 108 (90 Base) MCG/ACT inhaler Inhale 2 puffs into the lungs every 6 (six) hours as needed for wheezing or shortness of breath.   apixaban (ELIQUIS) 5 MG TABS tablet TAKE ONE TABLET BY MOUTH TWICE DAILY   buPROPion (WELLBUTRIN XL) 300 MG 24 hr tablet TAKE ONE TABLET BY MOUTH EVERY DAY   carbidopa-levodopa (SINEMET IR) 25-250 MG tablet Take 1.5 tablets by mouth 3 (three) times daily.   carvedilol (COREG) 3.125 MG tablet TAKE TWO TABLETS TWICE A DAY WITH MEALS   entacapone (COMTAN) 200 MG tablet TAKE 1 TABLET BY MOUTH 3 TIMES DAILY   esomeprazole (NEXIUM) 40 MG capsule TAKE 1 CAPSULE BY MOUTH ONCE DAILY  feeding supplement (ENSURE ENLIVE / ENSURE PLUS) LIQD Take 237 mLs by mouth 3 (three) times daily between meals.   fluticasone (FLONASE) 50 MCG/ACT nasal spray USE 2 PUFFS  IN EACH NOSTRIL DAILY   furosemide (LASIX) 20 MG tablet Take 1 tablet (20 mg total) by mouth daily.   GEMTESA 75 MG TABS TAKE 1 TABLET BY MOUTH DAILY   hydrocortisone 2.5 % lotion For seborrheic dermatitis of the face apply to aa's QAM on Tuesday, Thursday, and Saturday.   ketoconazole (NIZORAL) 2 % cream Apply a thin coat to face QAM on Monday, Wednesday, and Friday.   ketoconazole (NIZORAL) 2 % shampoo Shampoo into the face and scalp let sit 5 minutes then wash off. Use 3d/wk.   ketorolac (ACULAR) 0.5 % ophthalmic solution SMARTSIG:1 Drop(s) In Eye(s) Every 4-6 Hours PRN   lactulose (CHRONULAC) 10 GM/15ML solution TAKE 30 MLS BY MOUTH TWICE DAILY AS NEEDED FOR MODERATE CONSTIPATION   levothyroxine (SYNTHROID) 50 MCG tablet TAKE ONE TABLET ON AN EMPTY STOMACH WITHA GLASS OF WATER AT LEAST 30 TO 60 MINUTES BEFORE BREAKFAST   losartan (COZAAR) 100 MG tablet TAKE 1 TABLET BY MOUTH DAILY   mometasone (ELOCON) 0.1 % cream    mupirocin ointment (BACTROBAN) 2 % Apply 1 application topically 3 (three) times daily as needed.   potassium chloride SA (KLOR-CON) 10 MEQ tablet Take 1 tablet (10 mEq total) by mouth daily.   QUEtiapine (SEROQUEL) 25 MG tablet 1/2 tablet in the afternoon, 1 tablet at night (Patient taking differently: 1 tablet at night)   Respiratory Therapy Supplies (FLUTTER) DEVI 1 Device by Does not apply route daily.   rosuvastatin (CRESTOR) 10 MG tablet TAKE 1 TABLET BY MOUTH DAILY   sertraline (ZOLOFT) 50 MG tablet TAKE 1 TABLET BY MOUTH DAILY   silodosin (RAPAFLO) 8 MG CAPS capsule TAKE 1 CAPSULE BY MOUTH ONCE DAILY WITH BREAKFAST   Tiotropium Bromide-Olodaterol (STIOLTO RESPIMAT) 2.5-2.5 MCG/ACT AERS Inhale 2 puffs into the lungs daily.   No facility-administered encounter medications on file as of 03/04/2021.  :  Review of Systems:  Out of a complete 14 point review of systems, all are reviewed and negative with the exception of these symptoms as listed below: Review of Systems   Neurological:        Pt is here for follow up visit for parkinson . Wife states that patient is sleeping a lot . Pt states balance has worsen. Patient is HOH.    Objective:  Neurological Exam  Physical Exam Physical Examination:   Vitals:   03/04/21 1332  BP: (!) 152/86  Pulse: 76   General Examination: The patient is a very pleasant 85 y.o. male in no acute distress. He appears well-developed and well-nourished and well groomed.   HEENT: Normocephalic, atraumatic, pupils are equal, round and reactive to light, extraocular tracking is mildly impaired, hearing is impaired.  He did not bring his hearing aids.  Face is symmetric, mild facial masking noted, mild to moderate nuchal rigidity.  Airway examination reveals mild to moderate mouth dryness, no carotid bruits, no lip, neck or jaw tremor.  Mild hypophonia, mild dysarthria.   Chest: Clear to auscultation without wheezing, rhonchi or crackles noted.  Heart: S1+S2+0, regular and normal without murmurs, rubs or gallops noted.   Abdomen: Soft, non-tender and non-distended.  Extremities: There is mild swelling in both lower extremities.   Skin: Warm and dry with, chronic appearing changes including chronic bruising.  Musculoskeletal: exam reveals no obvious joint deformities.  Neurologically:  Mental status: The patient is awake, alert and oriented in all 4 spheres. His immediate and remote memory, attention, language skills and fund of knowledge are impaired.  He is hard of hearing, history is primarily provided by his wife.    Cranial nerves II - XII are as described above under HEENT exam.  Motor exam: Normal bulk, global strength of 4/5, intermittent resting tremor in the left hand noted.  Intermittent dyskinesias and cocontraction in the left upper extremity and mild dyskinesias in the left lower extremity also intermittently noted.  Globally impaired in the moderate range, left-sided lateralization noted with foot taps and hand  movements. Cerebellar testing: No dysmetria or intention tremor. There is no truncal or gait ataxia.  Sensory exam: intact to light touch in the upper and lower extremities.  Gait, station and balance: He stands with mild difficulty, posture is mildly stooped for age.  He walks with mild dystonic posturing of the left arm, no telltale shuffling, no walking aids, mild gait instability noted.   Assessment and Plan:   In summary, JOHNY PITSTICK is a very pleasant 85 y.o.-year old male with an underlying complex medical history of coronary artery disease, chronic diastolic congestive heart failure, obstructive sleep apnea, hypertension, hyperlipidemia, paroxysmal A. fib, on Eliquis, degenerative cervical and lumbar disc disease, squamous cell cancer, reflux disease, interstitial lung disease, thrombocytopenia, depression and obesity, who presents for follow-up consultation of his parkinsonism, complicated by memory loss, hallucinations and recurrent falls, as well as significant daytime somnolence.  His sleepiness dates back several years, and even in 2010 when he was originally diagnosed with Parkinson's disease.  He had left-sided symptoms at the time and was diagnosed by Dr. Felton Clinton.  With left-sided predominant Parkinson's disease, he has been on levodopa therapy since then.  He is currently on generic Sinemet 25-250 mg strength 1-1/2 pills 3 times daily.  He is also on Comtan 200 mg 3 times daily generic.  He is also on Seroquel for hallucinations, 25 mg at bedtime.  He is no longer on Provigil, it was not helpful. I also favor that he stay off of it because it is a high risk medications and since it was not effective for him, I recommended that he no longer use it.   I suggested we can scale back on Sinemet, it may be a contributor to his sleepiness although his sleepiness dates back to before he even started Sinemet from what I understand.  He has sleep apnea and had a recent titration study this year, his  CPAP therapy pressure was increased to 12 cm.  He admits that he has not been recently consistent with his CPAP therapy and is encouraged to be fully compliant with treatment.   I would like for him to take Sinemet 25-250 mg strength 1 pill 4 times a day at 9 AM, 1 PM, 5 PM and 9 PM daily.  He is going to stay off of modafinil.  He is continuing with Comtan 1 pill 3 times daily at 9 AM, 1 PM, 5 PM daily.  He can continue with Seroquel 25 mg at bedtime. We can potentially consider Nuplazid in the near future for hallucinations associated with Parkinson's disease. He is not urged to follow-up in this clinic to see one of our nurse practitioners in 4 months, sooner if needed. I answered all their questions today and the patient and his wife are in agreement. I spent 40 minutes in total face-to-face time and in reviewing  records during pre-charting, more than 50% of which was spent in counseling and coordination of care, reviewing test results, reviewing medications and treatment regimen and/or in discussing or reviewing the diagnosis of PD, the prognosis and treatment options. Pertinent laboratory and imaging test results that were available during this visit with the patient were reviewed by me and considered in my medical decision making (see chart for details).

## 2021-03-08 ENCOUNTER — Encounter: Payer: Self-pay | Admitting: Family Medicine

## 2021-03-08 ENCOUNTER — Telehealth: Payer: Self-pay | Admitting: Pulmonary Disease

## 2021-03-08 ENCOUNTER — Other Ambulatory Visit: Payer: Self-pay

## 2021-03-08 ENCOUNTER — Ambulatory Visit (INDEPENDENT_AMBULATORY_CARE_PROVIDER_SITE_OTHER): Payer: HMO | Admitting: Family Medicine

## 2021-03-08 VITALS — BP 110/80 | HR 76 | Temp 96.9°F | Ht 67.0 in | Wt 232.4 lb

## 2021-03-08 DIAGNOSIS — Z9989 Dependence on other enabling machines and devices: Secondary | ICD-10-CM

## 2021-03-08 DIAGNOSIS — J449 Chronic obstructive pulmonary disease, unspecified: Secondary | ICD-10-CM | POA: Diagnosis not present

## 2021-03-08 DIAGNOSIS — F32A Depression, unspecified: Secondary | ICD-10-CM

## 2021-03-08 DIAGNOSIS — J8410 Pulmonary fibrosis, unspecified: Secondary | ICD-10-CM | POA: Diagnosis not present

## 2021-03-08 DIAGNOSIS — G20 Parkinson's disease: Secondary | ICD-10-CM | POA: Diagnosis not present

## 2021-03-08 DIAGNOSIS — G4733 Obstructive sleep apnea (adult) (pediatric): Secondary | ICD-10-CM

## 2021-03-08 DIAGNOSIS — J309 Allergic rhinitis, unspecified: Secondary | ICD-10-CM | POA: Diagnosis not present

## 2021-03-08 DIAGNOSIS — F419 Anxiety disorder, unspecified: Secondary | ICD-10-CM

## 2021-03-08 DIAGNOSIS — Z23 Encounter for immunization: Secondary | ICD-10-CM | POA: Diagnosis not present

## 2021-03-08 NOTE — Patient Instructions (Signed)
Nice to see you. Please start using her Flonase again.  If this is not helpful please let us know. Please continue to monitor your sleepiness with the change in your medication.

## 2021-03-08 NOTE — Assessment & Plan Note (Signed)
Chronic issue.  Generally stable.  His medication may be contributing to his sleepiness.  He will follow neurology's instructions regarding his medication.  Discussed that the sleepiness may just be related to his underlying Parkinson's.

## 2021-03-08 NOTE — Assessment & Plan Note (Signed)
Chronic issue.  Generally stable.

## 2021-03-08 NOTE — Assessment & Plan Note (Signed)
Chronic issue.  Typically bothersome at this time of year.  He will try using the Flonase daily.  If he develops any worsening symptoms they will get him tested for COVID.

## 2021-03-08 NOTE — Assessment & Plan Note (Signed)
I encouraged him to get started on his new CPAP to see if that helps with his sleeping.

## 2021-03-08 NOTE — Progress Notes (Signed)
[name], MD Phone: [phone]  Andre [name] is a 85 y.o. male who presents today for f/u.  Dementia/Parkinson's: Patient continues on Seroquel, entacapone, and Sinemet.  They report his neurologist recently decreased the dose of the Sinemet given the patient has been sleeping a lot.  They report he has been sleeping up to 15 hours a day at times.  He does sleep well at night.  OSA: Patient has trouble with his mask.  He feels like it leaks some.  He is in the process of getting a new machine.  He is hopeful that that will help reduce the leakage and help with his sleep.  Depression: Patient notes occasionally having some depression though nothing significant.  He is on Wellbutrin and Zoloft with good benefit.  No SI.  COPD/pulmonary fibrosis: Patient notes chronic stable dyspnea.  Occasionally it is hard for him to breathe though generally things are stable.  He notes no cough or wheezing.  He continues on Stiolto and as needed albuterol.  Allergic rhinitis: Patient has chronic allergies.  Notes some runny nose and scratchy throat with the need to clear his throat recently.  No sore throat or fevers.  No significant cough.  No COVID exposures.  He does not use his Flonase daily.  Social History   Tobacco Use  Smoking Status Former   Packs/day: 1.00   Years: 10.00   Pack years: 10.00   Types: Cigarettes, Pipe, Cigars   Quit date: 04/18/1972   Years since quitting: 48.9  Smokeless Tobacco Former   Types: Chew   Quit date: 04/18/1972    Current Outpatient Medications on File Prior to Visit  Medication Sig Dispense Refill   albuterol (VENTOLIN HFA) 108 (90 Base) MCG/ACT inhaler Inhale 2 puffs into the lungs every 6 (six) hours as needed for wheezing or shortness of breath. 8 g 2   apixaban (ELIQUIS) 5 MG TABS tablet TAKE ONE TABLET BY MOUTH TWICE DAILY 180 tablet 1   buPROPion (WELLBUTRIN XL) 300 MG 24 hr tablet TAKE ONE TABLET BY MOUTH EVERY DAY 90 tablet 1    carbidopa-levodopa (SINEMET IR) 25-250 MG tablet Take 1 tablet by mouth 4 (four) times daily. 360 tablet 3   carvedilol (COREG) 3.125 MG tablet TAKE TWO TABLETS TWICE A DAY WITH MEALS 120 tablet 5   entacapone (COMTAN) 200 MG tablet TAKE 1 TABLET BY MOUTH 3 TIMES DAILY 270 tablet 3   esomeprazole (NEXIUM) 40 MG capsule TAKE 1 CAPSULE BY MOUTH ONCE DAILY 30 capsule 2   feeding supplement (ENSURE ENLIVE / ENSURE PLUS) LIQD Take 237 mLs by mouth 3 (three) times daily between meals. 237 mL 12   fluticasone (FLONASE) 50 MCG/ACT nasal spray USE 2 PUFFS IN EACH NOSTRIL DAILY 16 g 3   furosemide (LASIX) 20 MG tablet Take 1 tablet (20 mg total) by mouth daily. 90 tablet 0   GEMTESA 75 MG TABS TAKE 1 TABLET BY MOUTH DAILY 30 tablet 6   hydrocortisone 2.5 % lotion For seborrheic dermatitis of the face apply to aa's QAM on Tuesday, Thursday, and Saturday. 59 mL 0   ketoconazole (NIZORAL) 2 % cream Apply a thin coat to face QAM on Monday, Wednesday, and Friday. 60 g 3   ketoconazole (NIZORAL) 2 % shampoo Shampoo into the face and scalp let sit 5 minutes then wash off. Use 3d/wk. 120 mL 3   ketorolac (ACULAR) 0.5 % ophthalmic solution SMARTSIG:1 Drop(s) In Eye(s) Every 4-6 Hours PRN     lactulose (  CHRONULAC) 10 GM/15ML solution TAKE 30 MLS BY MOUTH TWICE DAILY AS NEEDED FOR MODERATE CONSTIPATION 236 mL 0   levothyroxine (SYNTHROID) 50 MCG tablet TAKE ONE TABLET ON AN EMPTY STOMACH WITHA GLASS OF WATER AT LEAST 30 TO 60 MINUTES BEFORE BREAKFAST 90 tablet 1   losartan (COZAAR) 100 MG tablet TAKE 1 TABLET BY MOUTH DAILY 90 tablet 1   mometasone (ELOCON) 0.1 % cream      mupirocin ointment (BACTROBAN) 2 % Apply 1 application topically 3 (three) times daily as needed.     potassium chloride SA (KLOR-CON) 10 MEQ tablet Take 1 tablet (10 mEq total) by mouth daily. 90 tablet 1   QUEtiapine (SEROQUEL) 25 MG tablet 1/2 tablet in the afternoon, 1 tablet at night (Patient taking differently: 1 tablet at night) 135 tablet 1    Respiratory Therapy Supplies (FLUTTER) DEVI 1 Device by Does not apply route daily. 1 each 0   rosuvastatin (CRESTOR) 10 MG tablet TAKE 1 TABLET BY MOUTH DAILY 90 tablet 0   sertraline (ZOLOFT) 50 MG tablet TAKE 1 TABLET BY MOUTH DAILY 30 tablet 3   silodosin (RAPAFLO) 8 MG CAPS capsule TAKE 1 CAPSULE BY MOUTH ONCE DAILY WITH BREAKFAST 30 capsule 3   Tiotropium Bromide-Olodaterol (STIOLTO RESPIMAT) 2.5-2.5 MCG/ACT AERS Inhale 2 puffs into the lungs daily. 1 g 5   No current facility-administered medications on file prior to visit.     ROS see history of present illness  Objective  Physical Exam Vitals:   03/08/21 1456  BP: 110/80  Pulse: 76  Temp: (!) 96.9 F (36.1 C)  SpO2: 95%    BP Readings from Last 3 Encounters:  03/08/21 110/80  03/04/21 (!) 152/86  01/26/21 130/82   Wt Readings from Last 3 Encounters:  03/08/21 232 lb 6.4 oz (105.4 kg)  03/04/21 231 lb (104.8 kg)  01/26/21 228 lb 6 oz (103.6 kg)    Physical Exam Constitutional:      General: He is not in acute distress.    Appearance: He is not diaphoretic.  Cardiovascular:     Rate and Rhythm: Normal rate and regular rhythm.     Heart sounds: Normal heart sounds.  Pulmonary:     Effort: Pulmonary effort is normal.     Breath sounds: Normal breath sounds.  Skin:    General: Skin is warm and dry.  Neurological:     Mental Status: He is alert.     Assessment/Plan: Please see individual problem list.  Problem List Items Addressed This Visit     Parkinson's disease ([place]) (Chronic)    Chronic issue.  Generally stable.  His medication may be contributing to his sleepiness.  He will follow neurology's instructions regarding his medication.  Discussed that the sleepiness may just be related to his underlying Parkinson's.      Allergic rhinitis    Chronic issue.  Typically bothersome at this time of year.  He will try using the Flonase daily.  If he develops any worsening symptoms they will get him tested  for COVID.      Anxiety and depression    Chronic issue.  Generally stable.  Generally well controlled.  He will continue Wellbutrin 300 mg once daily and Zoloft 50 mg daily.      COPD (chronic obstructive pulmonary disease) (HCC)    Chronic issue.  Generally stable.  He will continue Stiolto.      OSA (obstructive sleep apnea)    I encouraged him to get started  on his new CPAP to see if that helps with his sleeping.      Pulmonary fibrosis (HCC)    Chronic issue.  Generally stable.       Return in about 3 months (around 06/08/2021).  This visit occurred during the SARS-CoV-2 public health emergency.  Safety protocols were in place, including screening questions prior to the visit, additional usage of staff PPE, and extensive cleaning of exam room while observing appropriate contact time as indicated for disinfecting solutions.    [name], MD [place]

## 2021-03-08 NOTE — Telephone Encounter (Signed)
"  Pt is ellegible for new CPAP and needs new prescription for that. Please send to [facility]."  Routing to Dr. Halford Chessman since he has seen the pt for sleep.  Dr. Halford Chessman, please advise if it is okay to send the new cpap order for pt.

## 2021-03-08 NOTE — Assessment & Plan Note (Signed)
Chronic issue.  Generally stable.  Generally well controlled.  He will continue Wellbutrin 300 mg once daily and Zoloft 50 mg daily.

## 2021-03-08 NOTE — Assessment & Plan Note (Signed)
Chronic issue.  Generally stable.  He will continue Stiolto.

## 2021-03-09 ENCOUNTER — Other Ambulatory Visit: Payer: Self-pay | Admitting: Family Medicine

## 2021-03-09 ENCOUNTER — Other Ambulatory Visit: Payer: Self-pay | Admitting: Cardiovascular Disease

## 2021-03-09 NOTE — Telephone Encounter (Signed)
ATC patient. No VM available.  

## 2021-03-09 NOTE — Telephone Encounter (Signed)
He was supposed to have a CPAP titration study in July 2022 to determine if he needed adjustment in his CPAP setting or if  he needed supplemental oxygen added to CPAP.  Preference would be to have him do CPAP titration study first and then arrange for new CPAP machine.

## 2021-03-12 ENCOUNTER — Other Ambulatory Visit: Payer: Self-pay | Admitting: Cardiovascular Disease

## 2021-03-16 ENCOUNTER — Other Ambulatory Visit: Payer: Self-pay | Admitting: Neurology

## 2021-03-18 ENCOUNTER — Other Ambulatory Visit: Payer: Self-pay | Admitting: Family Medicine

## 2021-03-18 ENCOUNTER — Ambulatory Visit: Payer: HMO

## 2021-03-24 DIAGNOSIS — M10072 Idiopathic gout, left ankle and foot: Secondary | ICD-10-CM | POA: Diagnosis not present

## 2021-03-24 DIAGNOSIS — Z7901 Long term (current) use of anticoagulants: Secondary | ICD-10-CM | POA: Diagnosis not present

## 2021-03-26 ENCOUNTER — Other Ambulatory Visit: Payer: Self-pay | Admitting: Urology

## 2021-03-29 ENCOUNTER — Telehealth: Payer: Self-pay | Admitting: Neurology

## 2021-03-29 ENCOUNTER — Other Ambulatory Visit: Payer: Self-pay

## 2021-03-29 ENCOUNTER — Ambulatory Visit (INDEPENDENT_AMBULATORY_CARE_PROVIDER_SITE_OTHER): Payer: HMO

## 2021-03-29 DIAGNOSIS — E538 Deficiency of other specified B group vitamins: Secondary | ICD-10-CM | POA: Diagnosis not present

## 2021-03-29 MED ORDER — CYANOCOBALAMIN 1000 MCG/ML IJ SOLN
1000.0000 ug | Freq: Once | INTRAMUSCULAR | Status: AC
  Administered 2021-03-29: 1000 ug via INTRAMUSCULAR

## 2021-03-29 NOTE — Telephone Encounter (Signed)
Noted, thank you

## 2021-03-29 NOTE — Telephone Encounter (Signed)
Pt's wife, Amani Nodarse (on [place]) called, he is sleeping a lot. Should he fight going to sleep or should he just sleep. Do we need to worry about it. Would like a call from the nurse.

## 2021-03-29 NOTE — Progress Notes (Signed)
Patient presented for B 12 injection to left deltoid, patient voiced no concerns nor showed any signs of distress during injection. 

## 2021-03-29 NOTE — Telephone Encounter (Signed)
I called wife of pt.  She had pt taking 1 tablet five x daily. CL 25-250mg .  I relayed when he was last seen he was taking 1 1/2tablets tid.  I relayed that sleepiness was an issue at that appt, Dr. Rexene Alberts stated that could try taking the CL (carbidopa levodopa 25/250mg  tabs 1 QID).  Will try this and see if will make difference.  Will call back as needed.  He is taking the seroquel 25mg  po qhs. Is on 2 antidepressants wellbutrin and sertraline for depression as well.  She will see how he does.  She did not feel that he had any underlying infection.

## 2021-03-30 NOTE — Telephone Encounter (Signed)
ATC patient. No answer and no VM set up.

## 2021-03-30 NOTE — Telephone Encounter (Signed)
Spoke to patients wife (ok per dpr). Explained to her that Dr. Halford Chessman would like CPAP titration study done first and then set up a new cpap. She voiced understanding. Her and patient are okay with plan. Order placed.

## 2021-04-06 ENCOUNTER — Other Ambulatory Visit: Payer: Self-pay | Admitting: Family Medicine

## 2021-04-06 ENCOUNTER — Other Ambulatory Visit: Payer: Self-pay | Admitting: Cardiovascular Disease

## 2021-04-06 DIAGNOSIS — F419 Anxiety disorder, unspecified: Secondary | ICD-10-CM

## 2021-04-06 DIAGNOSIS — I4821 Permanent atrial fibrillation: Secondary | ICD-10-CM

## 2021-04-06 NOTE — Telephone Encounter (Signed)
Please review

## 2021-04-06 NOTE — Telephone Encounter (Signed)
Prescription refill request for Eliquis received. Indication: Atrial fib Last office visit: 01/26/21  Rod Can MD Scr: 0.80 on 01/04/21 Age: 85 Weight: 103.6kg  Based on above findings Eliquis 5mg  twice daily is the appropriate dose.  Refill approved.

## 2021-04-08 ENCOUNTER — Other Ambulatory Visit: Payer: Self-pay

## 2021-04-08 ENCOUNTER — Other Ambulatory Visit: Payer: Self-pay | Admitting: Primary Care

## 2021-04-08 MED ORDER — ALBUTEROL SULFATE HFA 108 (90 BASE) MCG/ACT IN AERS: 2.0000 | INHALATION_SPRAY | Freq: Four times a day (QID) | RESPIRATORY_TRACT | 6 refills | Status: DC | PRN

## 2021-04-11 ENCOUNTER — Emergency Department: Payer: HMO

## 2021-04-11 ENCOUNTER — Other Ambulatory Visit: Payer: Self-pay

## 2021-04-11 ENCOUNTER — Inpatient Hospital Stay
Admission: EM | Admit: 2021-04-11 | Discharge: 2021-04-13 | DRG: 291 | Disposition: A | Discharge status: Home Health | Payer: HMO | Attending: Internal Medicine | Admitting: Internal Medicine

## 2021-04-11 DIAGNOSIS — I517 Cardiomegaly: Secondary | ICD-10-CM | POA: Diagnosis not present

## 2021-04-11 DIAGNOSIS — Z8701 Personal history of pneumonia (recurrent): Secondary | ICD-10-CM

## 2021-04-11 DIAGNOSIS — Z20822 Contact with and (suspected) exposure to covid-19: Secondary | ICD-10-CM | POA: Diagnosis present

## 2021-04-11 DIAGNOSIS — J9601 Acute respiratory failure with hypoxia: Secondary | ICD-10-CM | POA: Diagnosis present

## 2021-04-11 DIAGNOSIS — Z951 Presence of aortocoronary bypass graft: Secondary | ICD-10-CM

## 2021-04-11 DIAGNOSIS — N3281 Overactive bladder: Secondary | ICD-10-CM | POA: Diagnosis present

## 2021-04-11 DIAGNOSIS — E032 Hypothyroidism due to medicaments and other exogenous substances: Secondary | ICD-10-CM | POA: Diagnosis present

## 2021-04-11 DIAGNOSIS — N401 Enlarged prostate with lower urinary tract symptoms: Secondary | ICD-10-CM | POA: Diagnosis present

## 2021-04-11 DIAGNOSIS — G4733 Obstructive sleep apnea (adult) (pediatric): Secondary | ICD-10-CM | POA: Diagnosis present

## 2021-04-11 DIAGNOSIS — K219 Gastro-esophageal reflux disease without esophagitis: Secondary | ICD-10-CM | POA: Diagnosis present

## 2021-04-11 DIAGNOSIS — Z7901 Long term (current) use of anticoagulants: Secondary | ICD-10-CM | POA: Diagnosis not present

## 2021-04-11 DIAGNOSIS — Z86711 Personal history of pulmonary embolism: Secondary | ICD-10-CM

## 2021-04-11 DIAGNOSIS — Z85828 Personal history of other malignant neoplasm of skin: Secondary | ICD-10-CM

## 2021-04-11 DIAGNOSIS — R52 Pain, unspecified: Secondary | ICD-10-CM

## 2021-04-11 DIAGNOSIS — I700 Atherosclerosis of aorta: Secondary | ICD-10-CM | POA: Diagnosis present

## 2021-04-11 DIAGNOSIS — G20 Parkinson's disease: Secondary | ICD-10-CM | POA: Diagnosis present

## 2021-04-11 DIAGNOSIS — J8410 Pulmonary fibrosis, unspecified: Secondary | ICD-10-CM | POA: Diagnosis present

## 2021-04-11 DIAGNOSIS — R0602 Shortness of breath: Secondary | ICD-10-CM

## 2021-04-11 DIAGNOSIS — I480 Paroxysmal atrial fibrillation: Secondary | ICD-10-CM | POA: Diagnosis present

## 2021-04-11 DIAGNOSIS — I110 Hypertensive heart disease with heart failure: Secondary | ICD-10-CM | POA: Diagnosis not present

## 2021-04-11 DIAGNOSIS — E669 Obesity, unspecified: Secondary | ICD-10-CM | POA: Diagnosis present

## 2021-04-11 DIAGNOSIS — I5033 Acute on chronic diastolic (congestive) heart failure: Secondary | ICD-10-CM | POA: Diagnosis present

## 2021-04-11 DIAGNOSIS — Z6836 Body mass index (BMI) 36.0-36.9, adult: Secondary | ICD-10-CM | POA: Diagnosis not present

## 2021-04-11 DIAGNOSIS — L89159 Pressure ulcer of sacral region, unspecified stage: Secondary | ICD-10-CM | POA: Diagnosis present

## 2021-04-11 DIAGNOSIS — Z888 Allergy status to other drugs, medicaments and biological substances status: Secondary | ICD-10-CM

## 2021-04-11 DIAGNOSIS — Z87891 Personal history of nicotine dependence: Secondary | ICD-10-CM

## 2021-04-11 DIAGNOSIS — I2510 Atherosclerotic heart disease of native coronary artery without angina pectoris: Secondary | ICD-10-CM | POA: Diagnosis present

## 2021-04-11 DIAGNOSIS — F32A Depression, unspecified: Secondary | ICD-10-CM | POA: Diagnosis present

## 2021-04-11 DIAGNOSIS — Z79899 Other long term (current) drug therapy: Secondary | ICD-10-CM

## 2021-04-11 DIAGNOSIS — I509 Heart failure, unspecified: Secondary | ICD-10-CM

## 2021-04-11 DIAGNOSIS — R0902 Hypoxemia: Secondary | ICD-10-CM

## 2021-04-11 DIAGNOSIS — Z79890 Hormone replacement therapy (postmenopausal): Secondary | ICD-10-CM

## 2021-04-11 DIAGNOSIS — E785 Hyperlipidemia, unspecified: Secondary | ICD-10-CM | POA: Diagnosis present

## 2021-04-11 DIAGNOSIS — L8990 Pressure ulcer of unspecified site, unspecified stage: Secondary | ICD-10-CM | POA: Insufficient documentation

## 2021-04-11 DIAGNOSIS — Z955 Presence of coronary angioplasty implant and graft: Secondary | ICD-10-CM

## 2021-04-11 DIAGNOSIS — I10 Essential (primary) hypertension: Secondary | ICD-10-CM | POA: Diagnosis not present

## 2021-04-11 DIAGNOSIS — F0280 Dementia in other diseases classified elsewhere without behavioral disturbance: Secondary | ICD-10-CM | POA: Diagnosis present

## 2021-04-11 LAB — COMPREHENSIVE METABOLIC PANEL
ALT: 12 U/L (ref 0–44)
AST: 23 U/L (ref 15–41)
Albumin: 3.4 g/dL — ABNORMAL LOW (ref 3.5–5.0)
Alkaline Phosphatase: 85 U/L (ref 38–126)
Anion gap: 7 (ref 5–15)
BUN: 17 mg/dL (ref 8–23)
CO2: 26 mmol/L (ref 22–32)
Calcium: 8.5 mg/dL — ABNORMAL LOW (ref 8.9–10.3)
Chloride: 105 mmol/L (ref 98–111)
Creatinine, Ser: 0.88 mg/dL (ref 0.61–1.24)
GFR, Estimated: >60 mL/min (ref >60)
Glucose, Bld: 138 mg/dL — ABNORMAL HIGH (ref 70–99)
Potassium: 4.3 mmol/L (ref 3.5–5.1)
Sodium: 138 mmol/L (ref 135–145)
Total Bilirubin: 2.3 mg/dL — ABNORMAL HIGH (ref 0.3–1.2)
Total Protein: 6.6 g/dL (ref 6.5–8.1)

## 2021-04-11 LAB — CBC
HCT: 48.5 % (ref 39.0–52.0)
Hemoglobin: 15.5 g/dL (ref 13.0–17.0)
MCH: 30.5 pg (ref 26.0–34.0)
MCHC: 32 g/dL (ref 30.0–36.0)
MCV: 95.3 fL (ref 80.0–100.0)
Platelets: 138 10*3/uL — ABNORMAL LOW (ref 150–400)
RBC: 5.09 MIL/uL (ref 4.22–5.81)
RDW: 14.9 % (ref 11.5–15.5)
WBC: 11.9 10*3/uL — ABNORMAL HIGH (ref 4.0–10.5)
nRBC: 0 % (ref 0.0–0.2)

## 2021-04-11 LAB — RESP PANEL BY RT-PCR (FLU A&B, COVID) ARPGX2
Influenza A by PCR: NEGATIVE
Influenza B by PCR: NEGATIVE
SARS Coronavirus 2 by RT PCR: NEGATIVE

## 2021-04-11 LAB — BRAIN NATRIURETIC PEPTIDE: B Natriuretic Peptide: 425.7 pg/mL — ABNORMAL HIGH (ref 0.0–100.0)

## 2021-04-11 LAB — TROPONIN I (HIGH SENSITIVITY)
Troponin I (High Sensitivity): 14 ng/L (ref <18)
Troponin I (High Sensitivity): 50 ng/L — ABNORMAL HIGH (ref <18)

## 2021-04-11 MED ORDER — IPRATROPIUM-ALBUTEROL 0.5-2.5 (3) MG/3ML IN SOLN: 3.0000 mL | RESPIRATORY_TRACT | Status: DC | PRN

## 2021-04-11 MED ORDER — MORPHINE SULFATE (PF) 2 MG/ML IV SOLN: 2.0000 mg | INTRAVENOUS | Status: DC | PRN

## 2021-04-11 MED ORDER — ACETAMINOPHEN 650 MG RE SUPP: 650.0000 mg | Freq: Four times a day (QID) | RECTAL | Status: DC | PRN

## 2021-04-11 MED ORDER — IPRATROPIUM-ALBUTEROL 0.5-2.5 (3) MG/3ML IN SOLN
3.0000 mL | Freq: Once | RESPIRATORY_TRACT | Status: AC
  Administered 2021-04-11: 12:00:00 3 mL via RESPIRATORY_TRACT
  Filled 2021-04-11: qty 3

## 2021-04-11 MED ORDER — CARVEDILOL 6.25 MG PO TABS
6.2500 mg | ORAL_TABLET | Freq: Two times a day (BID) | ORAL | Status: DC
  Administered 2021-04-11 – 2021-04-13 (×4): 6.25 mg via ORAL
  Filled 2021-04-11 (×4): qty 1

## 2021-04-11 MED ORDER — FUROSEMIDE 10 MG/ML IJ SOLN
40.0000 mg | Freq: Every day | INTRAMUSCULAR | Status: DC
  Administered 2021-04-12 – 2021-04-13 (×2): 40 mg via INTRAVENOUS
  Filled 2021-04-11 (×2): qty 4

## 2021-04-11 MED ORDER — FLUTICASONE PROPIONATE 50 MCG/ACT NA SUSP
2.0000 | Freq: Every day | NASAL | Status: DC
  Filled 2021-04-11: qty 16

## 2021-04-11 MED ORDER — ACETAMINOPHEN 325 MG PO TABS: 650.0000 mg | ORAL_TABLET | Freq: Four times a day (QID) | ORAL | Status: DC | PRN

## 2021-04-11 MED ORDER — TAMSULOSIN HCL 0.4 MG PO CAPS
0.4000 mg | ORAL_CAPSULE | Freq: Every day | ORAL | Status: DC
  Administered 2021-04-12 – 2021-04-13 (×2): 0.4 mg via ORAL
  Filled 2021-04-11 (×2): qty 1

## 2021-04-11 MED ORDER — ONDANSETRON HCL 4 MG/2ML IJ SOLN
4.0000 mg | Freq: Four times a day (QID) | INTRAMUSCULAR | Status: DC | PRN
  Administered 2021-04-11: 4 mg via INTRAVENOUS
  Filled 2021-04-11: qty 2

## 2021-04-11 MED ORDER — QUETIAPINE FUMARATE 25 MG PO TABS
25.0000 mg | ORAL_TABLET | Freq: Every day | ORAL | Status: DC
  Administered 2021-04-11 – 2021-04-12 (×2): 25 mg via ORAL
  Filled 2021-04-11 (×2): qty 1

## 2021-04-11 MED ORDER — IPRATROPIUM-ALBUTEROL 0.5-2.5 (3) MG/3ML IN SOLN
3.0000 mL | Freq: Four times a day (QID) | RESPIRATORY_TRACT | Status: DC
  Administered 2021-04-11 – 2021-04-13 (×7): 3 mL via RESPIRATORY_TRACT
  Filled 2021-04-11 (×8): qty 3

## 2021-04-11 MED ORDER — APIXABAN 5 MG PO TABS
5.0000 mg | ORAL_TABLET | Freq: Two times a day (BID) | ORAL | Status: DC
  Administered 2021-04-11 – 2021-04-13 (×4): 5 mg via ORAL
  Filled 2021-04-11 (×4): qty 1

## 2021-04-11 MED ORDER — ENTACAPONE 200 MG PO TABS
200.0000 mg | ORAL_TABLET | Freq: Three times a day (TID) | ORAL | Status: DC
  Administered 2021-04-11 – 2021-04-13 (×6): 200 mg via ORAL
  Filled 2021-04-11 (×8): qty 1

## 2021-04-11 MED ORDER — BUPROPION HCL ER (XL) 150 MG PO TB24
300.0000 mg | ORAL_TABLET | Freq: Every day | ORAL | Status: DC
  Administered 2021-04-11 – 2021-04-13 (×3): 300 mg via ORAL
  Filled 2021-04-11 (×3): qty 2

## 2021-04-11 MED ORDER — PANTOPRAZOLE SODIUM 40 MG PO TBEC
80.0000 mg | DELAYED_RELEASE_TABLET | Freq: Every day | ORAL | Status: DC
  Administered 2021-04-12: 14:00:00 80 mg via ORAL
  Filled 2021-04-11: qty 2

## 2021-04-11 MED ORDER — FUROSEMIDE 10 MG/ML IJ SOLN
60.0000 mg | Freq: Once | INTRAMUSCULAR | Status: AC
  Administered 2021-04-11: 14:00:00 60 mg via INTRAVENOUS
  Filled 2021-04-11: qty 8

## 2021-04-11 MED ORDER — SERTRALINE HCL 50 MG PO TABS
50.0000 mg | ORAL_TABLET | Freq: Every day | ORAL | Status: DC
  Administered 2021-04-11 – 2021-04-13 (×3): 50 mg via ORAL
  Filled 2021-04-11 (×3): qty 1

## 2021-04-11 MED ORDER — ENOXAPARIN SODIUM 40 MG/0.4ML IJ SOSY: 40.0000 mg | PREFILLED_SYRINGE | INTRAMUSCULAR | Status: DC

## 2021-04-11 MED ORDER — HYDROCODONE-ACETAMINOPHEN 5-325 MG PO TABS
1.0000 | ORAL_TABLET | ORAL | Status: DC | PRN
  Administered 2021-04-12: 22:00:00 1 via ORAL
  Filled 2021-04-11: qty 1

## 2021-04-11 MED ORDER — CARBIDOPA-LEVODOPA 25-250 MG PO TABS
1.0000 | ORAL_TABLET | Freq: Four times a day (QID) | ORAL | Status: DC
  Administered 2021-04-11 – 2021-04-13 (×7): 1 via ORAL
  Filled 2021-04-11 (×11): qty 1

## 2021-04-11 MED ORDER — VIBEGRON 75 MG PO TABS: 75.0000 mg | ORAL_TABLET | Freq: Every day | ORAL | Status: DC

## 2021-04-11 MED ORDER — POTASSIUM CHLORIDE CRYS ER 10 MEQ PO TBCR
10.0000 meq | EXTENDED_RELEASE_TABLET | Freq: Every day | ORAL | Status: DC
  Administered 2021-04-12 – 2021-04-13 (×2): 10 meq via ORAL
  Filled 2021-04-11 (×2): qty 1

## 2021-04-11 MED ORDER — ONDANSETRON HCL 4 MG PO TABS: 4.0000 mg | ORAL_TABLET | Freq: Four times a day (QID) | ORAL | Status: DC | PRN

## 2021-04-11 MED ORDER — LEVOTHYROXINE SODIUM 50 MCG PO TABS
50.0000 ug | ORAL_TABLET | Freq: Every day | ORAL | Status: DC
  Administered 2021-04-12 – 2021-04-13 (×2): 50 ug via ORAL
  Filled 2021-04-11 (×2): qty 1

## 2021-04-11 MED ORDER — LOSARTAN POTASSIUM 50 MG PO TABS
100.0000 mg | ORAL_TABLET | Freq: Every day | ORAL | Status: DC
  Administered 2021-04-11 – 2021-04-13 (×3): 100 mg via ORAL
  Filled 2021-04-11 (×3): qty 2

## 2021-04-11 MED ORDER — ROSUVASTATIN CALCIUM 10 MG PO TABS
10.0000 mg | ORAL_TABLET | Freq: Every day | ORAL | Status: DC
  Administered 2021-04-11 – 2021-04-13 (×3): 10 mg via ORAL
  Filled 2021-04-11 (×3): qty 1

## 2021-04-11 NOTE — ED Notes (Signed)
Pt vomited orange juice.  This RN and Scientist, product/process development assisted in cleaning pt and changing gown.  MD notified. Pt NAD at this time.

## 2021-04-11 NOTE — ED Triage Notes (Signed)
Pt here from home with Sob. Pt has a pacemaker and is unsure of if he has CPOD. Pt 90% on RA but 99% with 4L.    7822G L hand 160/100 131-cbg

## 2021-04-11 NOTE — ED Notes (Signed)
Pt alert at this time speaking with RN.

## 2021-04-11 NOTE — ED Provider Notes (Signed)
[facility] Emergency Department Provider Note  Time seen: 11:55 AM  I have reviewed the triage vital signs and the nursing notes.   HISTORY  Chief Complaint Shortness of Breath   HPI Andre [name] is a 85 y.o. male with a past medical history of CAD, CHF, hypertension, hyperlipidemia, Parkinson's, pulmonary fibrosis, presents emergency department for shortness of breath.  According to the patient over the past 2 days he has been getting progressively more short of breath.  Patient arrives via EMS shortness of breath and audible wheeze.  He does not believe he has COPD but does have CHF.  States normal amount of leg swelling.  No fever or chest pain.   Past Medical History:  Diagnosis Date   Atherosclerosis of abdominal aorta ([place])    Basal cell carcinoma 03/04/2008   Right nose supratip.    CAD (coronary artery disease)    Cervical spondylosis 10/01/2013   Chronic diastolic CHF (congestive heart failure) ([place])    a. 07/2016 Echo: >55%; b. 10/2016 Echo: EF 55-60%, Gr1 DD, Ao sclerosis w/o stenosis, sev dil LA; c. 08/2017 Echo: EF 60-65%, no rwma, Gr2 DD, mild AS, sev dil LA/RA.   Coronary artery disease    a. 1998 s/p mini-cabg @ [facility] - LIMA->LAD;  b. 07/2016 St Echo: Inadequate HR w/ HTN response;  c.  08/2016 MV: EF 67%, no ischemia; d. 10/2016 NSTEMI/Cath: RCA 95p (4.0x26 Onyx DES), LIMA->LAD nl; e. 09/2017 Cath: LM 40/30, LAD 100ost, RI 80, LCX nl, OM2/3 nl, RCA patent stent, 93m, LIMA->LAD nl-->Med Rx.   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 s/p DCCV-->maintaining sinus on amiodarone;  b. CHA2DS2VASc  = 5-->eliquis.   Parkinson's disease ([place])    tremors   Pleural effusion, right    a. 09/2017 s/p thoracentesis.   PNA (pneumonia) 08/26/2017   Pulmonary embolism ([place]) 2011   Pulmonary fibrosis ([place])    Secondary erythrocytosis 01/28/2015   Sleep apnea    wears CPAP   Squamous cell carcinoma of skin 03/19/2015   Right lateral crown. KA-like pattern   Thrombocytopenia [facility])     Patient Active Problem List   Diagnosis Date Noted   Left knee pain 08/24/2020   Dementia with parkinsonism ([place]) 06/03/2020   Head trauma 06/02/2020   Dehydration 05/05/2020   Leukocytosis 04/30/2020   Declining functional status 04/30/2020   Slurred speech 04/20/2020   Hypersomnia 04/20/2020   Coccydynia 11/27/2019   Rib pain on right side 09/30/2019   Left shoulder pain 09/30/2019   Gait abnormality 07/31/2019   Pulmonary fibrosis ([place]) 07/05/2019   Elevated glucose 11/09/2018   Educated about COVID-19 virus infection 11/09/2018   COPD (chronic obstructive pulmonary disease) ([place]) 07/09/2018   Hematuria 03/09/2018   Neck pain 03/09/2018   Myalgia 03/09/2018   Decreased hearing of both ears 03/09/2018   Cervical facet syndrome 01/22/2018   Muscle cramps 11/02/2017   Renal cyst 10/02/2017   Pleural effusion 09/18/2017   Diarrhea 08/21/2017   Anemia 08/21/2017   Renal lesion 07/08/2017   Fall 05/29/2017   Lightheadedness 05/29/2017  Abrasion 02/08/2017   Anxiety and depression 01/05/2017   Prediabetes 10/27/2016   Hypothyroidism 10/27/2016   (HFpEF) heart failure with preserved ejection fraction ([place]) 09/27/2016   Hypothyroidism due to medication 09/07/2016   Parkinson's disease ([place]) 06/30/2016   Thrombocytopenia ([place]) 01/29/2016   PAF (paroxysmal atrial fibrillation) (H[facility])    BMI 40.0-44.9, adult ([place]) 05/06/2015   Erythrocytosis 01/28/2015   Atherosclerosis of abdominal aorta ([place]) 12/08/2014   Barrett's esophagus 12/31/2013   DDD (degenerative disc disease), cervical 10/01/2013    DDD (degenerative disc disease), lumbar 10/01/2013   Coronary artery disease involving native coronary artery of native heart with angina pectoris ([place]) 10/30/2012   GERD (gastroesophageal reflux disease) 10/30/2012   Hyperlipidemia with target LDL less than 70 10/30/2012   Essential hypertension 10/30/2012   Dyspnea 09/10/2012   Allergic rhinitis 09/10/2012   OSA (obstructive sleep apnea) 09/10/2012    Past Surgical History:  Procedure Laterality Date   BACK SURGERY  1960   CARDIAC CATHETERIZATION     CATARACT EXTRACTION Right    CATARACT EXTRACTION Bilateral    CHOLECYSTECTOMY  2010   COLONOSCOPY WITH PROPOFOL N/A 06/07/2018   Procedure: COLONOSCOPY WITH PROPOFOL;  Surgeon: Lollie Sails, MD;  Location: [facility] ENDOSCOPY;  Service: Endoscopy;  Laterality: N/A;   CORONARY ARTERY BYPASS GRAFT  01/07/1997   CORONARY STENT INTERVENTION N/A 10/31/2016   Procedure: Coronary Stent Intervention;  Surgeon: Wellington Hampshire, MD;  Location: [place] CV LAB;  Service: Cardiovascular;  Laterality: N/A;   ELECTROPHYSIOLOGIC STUDY N/A 07/14/2015   Procedure: CARDIOVERSION;  Surgeon: Yolonda Kida, MD;  Location: [facility] ORS;  Service: Cardiovascular;  Laterality: N/A;   ELECTROPHYSIOLOGIC STUDY N/A 10/12/2015   Procedure: CARDIOVERSION;  Surgeon: Minna Merritts, MD;  Location: [facility] ORS;  Service: Cardiovascular;  Laterality: N/A;   LEFT HEART CATH AND CORONARY ANGIOGRAPHY N/A 10/31/2016   Procedure: Left Heart Cath and Coronary Angiography;  Surgeon: Wellington Hampshire, MD;  Location: [place] CV LAB;  Service: Cardiovascular;  Laterality: N/A;   OTHER SURGICAL HISTORY  1998   Bypass   RIGHT/LEFT HEART CATH AND CORONARY ANGIOGRAPHY N/A 09/18/2017   Procedure: RIGHT/LEFT HEART CATH AND CORONARY ANGIOGRAPHY;  Surgeon: Wellington Hampshire, MD;  Location: [place] CV LAB;  Service: Cardiovascular;  Laterality: N/A;    Prior to Admission medications   Medication Sig Start Date End  Date Taking? Authorizing Provider  albuterol (VENTOLIN HFA) 108 (90 Base) MCG/ACT inhaler Inhale 2 puffs into the lungs every 6 (six) hours as needed for wheezing or shortness of breath. 05/07/20   Danford, Suann Larry, MD  albuterol (VENTOLIN HFA) 108 (90 Base) MCG/ACT inhaler Inhale 2 puffs into the lungs every 6 (six) hours as needed for wheezing or shortness of breath. 04/08/21   Mannam, Hart Robinsons, MD  buPROPion (WELLBUTRIN XL) 300 MG 24 hr tablet TAKE ONE TABLET BY MOUTH EVERY DAY 08/20/20   [name], MD  carbidopa-levodopa (SINEMET IR) 25-250 MG tablet Take 1 tablet by mouth 4 (four) times daily. 03/04/21   Star Age, MD  carvedilol (COREG) 3.125 MG tablet TAKE TWO TABLETS TWICE A DAY WITH MEALS 12/04/20   Wellington Hampshire, MD  ELIQUIS 5 MG TABS tablet TAKE ONE TABLET BY MOUTH TWICE DAILY 04/06/21   Wellington Hampshire, MD  entacapone (COMTAN) 200 MG tablet TAKE 1 TABLET BY MOUTH 3 TIMES DAILY 07/16/20   Kathrynn Ducking, MD  esomeprazole (NEXIUM) 40 MG capsule TAKE 1 CAPSULE BY MOUTH ONCE DAILY 03/18/21  [name], MD  feeding supplement (ENSURE ENLIVE / ENSURE PLUS) LIQD Take 237 mLs by mouth 3 (three) times daily between meals. 05/07/20   Edwin Dada, MD  fluticasone (FLONASE) 50 MCG/ACT nasal spray USE 2 PUFFS IN [facility] NOSTRIL DAILY 03/09/21   [name], MD  furosemide (LASIX) 20 MG tablet Take 1 tablet (20 mg total) by mouth daily. 01/13/21   Wellington Hampshire, MD  GEMTESA 75 MG TABS TAKE 1 TABLET BY MOUTH DAILY 02/22/21   Abbie Sons, MD  hydrocortisone 2.5 % lotion For seborrheic dermatitis of the face apply to aa's QAM on Tuesday, Thursday, and Saturday. 11/05/20   Ralene Bathe, MD  ketoconazole (NIZORAL) 2 % cream Apply a thin coat to face QAM on Monday, Wednesday, and Friday. 11/05/20   Ralene Bathe, MD  ketoconazole (NIZORAL) 2 % shampoo Shampoo into the face and scalp let sit 5 minutes then wash off. Use 3d/wk. 11/05/20   Ralene Bathe, MD  ketorolac (ACULAR) 0.5 % ophthalmic solution SMARTSIG:1 Drop(s) In Eye(s) Every 4-6 Hours PRN 06/19/19   [provider]  lactulose (CHRONULAC) 10 GM/15ML solution TAKE 30 MLS BY MOUTH TWICE DAILY AS NEEDED FOR MODERATE CONSTIPATION 07/27/20   Crecencio Mc, MD  levothyroxine (SYNTHROID) 50 MCG tablet TAKE ONE TABLET ON AN EMPTY STOMACH WITHA GLASS OF WATER AT LEAST 30 TO 60 MINUTES BEFORE BREAKFAST 08/20/20   [name], MD  losartan (COZAAR) 100 MG tablet TAKE 1 TABLET BY MOUTH DAILY 11/13/20   [name], MD  mometasone (ELOCON) 0.1 % cream  01/01/18   [provider]  mupirocin ointment (BACTROBAN) 2 % Apply 1 application topically 3 (three) times daily as needed.    [provider]  potassium chloride SA (KLOR-CON) 10 MEQ tablet Take 1 tablet (10 mEq total) by mouth daily. 12/12/19   Wellington Hampshire, MD  QUEtiapine (SEROQUEL) 25 MG tablet Take 1 tablet (25 mg total) by mouth at bedtime. 1 tablet at night 03/16/21   Star Age, MD  Respiratory Therapy Supplies (FLUTTER) DEVI 1 Device by Does not apply route daily. 05/29/19   Martyn Ehrich, NP  rosuvastatin (CRESTOR) 10 MG tablet TAKE 1 TABLET BY MOUTH DAILY 03/09/21   Wellington Hampshire, MD  sertraline (ZOLOFT) 50 MG tablet TAKE 1 TABLET BY MOUTH DAILY 04/07/21   [name], MD  silodosin (RAPAFLO) 8 MG CAPS capsule TAKE 1 CAPSULE BY MOUTH ONCE DAILY WITH BREAKFAST 03/26/21   Stoioff, Ronda Fairly, MD  Tiotropium Bromide-Olodaterol (STIOLTO RESPIMAT) 2.5-2.5 MCG/ACT AERS Inhale 2 puffs into the lungs daily. 07/29/20   Marshell Garfinkel, MD    Allergies  Allergen Reactions   Pravastatin Other (See Comments)   Prednisone Other (See Comments)    Pt states that med makes him hyper Pt states that med makes him hyper    Family History  Problem Relation Age of Onset   Alcohol abuse Father    Parkinson's disease Neg Hx     Social History Social History   Tobacco Use   Smoking status:  Former    Packs/day: 1.00    Years: 10.00    Pack years: 10.00    Types: Cigarettes, Pipe, Cigars    Quit date: 04/18/1972    Years since quitting: 49.0   Smokeless tobacco: Former    Types: Chew    Quit date: 04/18/1972  Vaping Use   Vaping Use: Never used  Substance Use Topics  Alcohol use: Yes    Alcohol/week: 4.0 standard drinks    Types: 2 Cans of beer, 2 Shots of liquor per week    Comment: per 2 weeks    Drug use: No    Review of Systems Constitutional: Negative for fever. Cardiovascular: Negative for chest pain. Respiratory: Positive for shortness of breath. Gastrointestinal: Negative for abdominal pain, vomiting  Musculoskeletal: Negative for musculoskeletal complaints Neurological: Negative for headache All other ROS negative  ____________________________________________   PHYSICAL EXAM:  VITAL SIGNS: ED Triage Vitals  Enc Vitals Group     BP 04/11/21 1148 (!) 140/95     Pulse Rate 04/11/21 1148 91     Resp 04/11/21 1148 18     Temp 04/11/21 1148 (!) 97.2 F (36.2 C)     Temp Source 04/11/21 1148 Oral     SpO2 04/11/21 1142 99 %     Weight 04/11/21 1151 232 lb 5.8 oz (105.4 kg)     Height 04/11/21 1151 5\' 7"  (1.702 m)     Head Circumference --      Peak Flow --      Pain Score 04/11/21 1151 0     Pain Loc --      Pain Edu? --      Excl. in [place]? --    Constitutional: Awake alert.  Sitting upright in bed speaking in 1-2 word sentences, audible wheeze. Eyes: Normal exam ENT      Head: Normocephalic and atraumatic.      Mouth/Throat: Mucous membranes are moist. Cardiovascular: Normal rate, regular rhythm.  Respiratory: Mild tachypnea with slight wheeze in all lung fields. Gastrointestinal: Soft and nontender. No distention.   Musculoskeletal: 1+ lower extremity edema bilaterally.  Nontender. Neurologic:  Normal speech and language. No gross focal neurologic deficits  Skin:  Skin is warm, dry and intact.  Psychiatric: Mood and affect are normal.    ____________________________________________    EKG  EKG viewed and interpreted by myself appears to show atrial fibrillation at 86 bpm with a narrow QRS, normal axis, slight QTC prolongation otherwise normal intervals with nonspecific ST changes.  ____________________________________________    RADIOLOGY  Chest x-ray shows pulmonary fibrosis with possible interstitial edema  ____________________________________________   INITIAL IMPRESSION / ASSESSMENT AND PLAN / ED COURSE  Pertinent labs & imaging results that were available during my care of the patient were reviewed by me and considered in my medical decision making (see chart for details).   Patient presents emergency department for shortness of breath worsening over the past 2 days.  Upon arrival patient is sitting upright in bed with wheeze.  Patient has 1+ lower extremity edema does not believe he can sit back due to shortness of breath.  Differential would include pulmonary fibrosis/COPD versus CHF/ACS.  We will check labs including cardiac enzymes and a BNP.  We will obtain a chest x-ray.  We will trial the patient on BiPAP.  Satting around 90% on room air 94% on 4 L during my evaluation.  Patient has no baseline O2 requirement.  Patient is doing much better on BiPAP now able to speak in full sentences.  Chest x-ray consistent with interstitial edema superimposed on pulmonary fibrosis.  We will dose IV Lasix.  We will admit to the hospitalist service.  Remainder of the work-up is largely nonrevealing.  BNP is elevated consistent with CHF exacerbation as well.  COVID/flu negative.  Andre [name] was evaluated in Emergency Department on 04/11/2021 for the symptoms described in the  history of present illness. He was evaluated in the context of the global COVID-19 pandemic, which necessitated consideration that the patient might be at risk for infection with the SARS-CoV-2 virus that causes COVID-19. Institutional protocols and  algorithms that pertain to the evaluation of patients at risk for COVID-19 are in a state of rapid change based on information released by regulatory bodies including the CDC and federal and state organizations. These policies and algorithms were followed during the patient's care in the ED.  CRITICAL CARE Performed by: Harvest Dark   Total critical care time: 30 minutes  Critical care time was exclusive of separately billable procedures and treating other patients.  Critical care was necessary to treat or prevent imminent or life-threatening deterioration.  Critical care was time spent personally by me on the following activities: development of treatment plan with patient and/or surrogate as well as nursing, discussions with consultants, evaluation of patient's response to treatment, examination of patient, obtaining history from patient or surrogate, ordering and performing treatments and interventions, ordering and review of laboratory studies, ordering and review of radiographic studies, pulse oximetry and re-evaluation of patient's condition.  ____________________________________________   FINAL CLINICAL IMPRESSION(S) / ED DIAGNOSES  Dyspnea CHF exacerbation    Harvest Dark, MD 04/11/21 1445

## 2021-04-11 NOTE — ED Notes (Signed)
Pt changed into new gown, new sheets, and male primofit changed.

## 2021-04-11 NOTE — H&P (Signed)
H&P:    Andre [name]   WER:[id number] DOB: [birth year] DOA: 04/11/2021  PCP: [name], MD  Chief Complaint: Shortness of breath   History of Present Illness:    HPI: Andre [name] is a 84 y.o. male with a past medical history of Parkinson's disease, sleep apnea on CPAP, coronary artery disease, CHF, hypertension, hyperlipidemia, pulmonary fibrosis, former tobacco dependence, hypothyroidism, paroxysmal atrial fibrillation on Eliquis, GERD.  This patient presents to the emergency department with shortness of breath for the past 2 days.  Denies any cough.  Was noted to have wheezing when EMS arrived.  Denies any fevers or chills.  No chest pain.  No nausea vomiting or diarrhea.  No abdominal pain.  Endorses some orthopnea.  Does have 1+ edema in the lower extremities.  Not on home oxygen at baseline.  Placed on BiPAP in the emergency department.  Upon my evaluation he states he is feeling better.  States his shortness of breath was worse and he could only get a few words out but after breathing treatments in the emergency department he is now able to talk in full sentences  ED Course: Chest x-ray was limited due to underlying pulmonary fibrosis.  Possible interstitial edema.  BNP elevated at 425.  COVID-19 was negative.  EKG showed A. fib that was rate controlled.  Does have a mild leukocytosis at 11,000.  Was given Lasix 60 mg IV x1 and 2 DuoNeb treatments in the emergency department. Placed on BiPAP and currently on 12/6 at 32%.    ROS:   14 point review of systems is negative except for what is mentioned above in the HPI.   Past Medical History:   Past Medical History:  Diagnosis Date   Atherosclerosis of abdominal aorta ([place])    Basal cell carcinoma 03/04/2008   Right nose supratip.    CAD (coronary artery disease)    Cervical spondylosis 10/01/2013   Chronic diastolic CHF (congestive heart failure) ([place])    a. 07/2016 Echo: >55%; b. 10/2016 Echo: EF 55-60%, Gr1 DD,  Ao sclerosis w/o stenosis, sev dil LA; c. 08/2017 Echo: EF 60-65%, no rwma, Gr2 DD, mild AS, sev dil LA/RA.   Coronary artery disease    a. 1998 s/p mini-cabg @ [facility] - LIMA->LAD;  b. 07/2016 St Echo: Inadequate HR w/ HTN response;  c.  08/2016 MV: EF 67%, no ischemia; d. 10/2016 NSTEMI/Cath: RCA 95p (4.0x26 Onyx DES), LIMA->LAD nl; e. 09/2017 Cath: LM 40/30, LAD 100ost, RI 80, LCX nl, OM2/3 nl, RCA patent stent, 46m, LIMA->LAD nl-->Med Rx.   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 s/p DCCV-->maintaining sinus on amiodarone;  b. CHA2DS2VASc = 5-->eliquis.   Parkinson's disease ([place])    tremors   Pleural effusion, right    a. 09/2017 s/p thoracentesis.   PNA (pneumonia) 08/26/2017   Pulmonary embolism ([place]) 2011   Pulmonary fibrosis ([place])    Secondary erythrocytosis 01/28/2015   Sleep apnea    wears CPAP   Squamous cell carcinoma of skin 03/19/2015   Right lateral crown. KA-like pattern  Thrombocytopenia ([place])     Past Surgical History:   Past Surgical History:  Procedure Laterality Date   BACK SURGERY  1960   CARDIAC CATHETERIZATION     CATARACT EXTRACTION Right    CATARACT EXTRACTION Bilateral    CHOLECYSTECTOMY  2010   COLONOSCOPY WITH PROPOFOL N/A 06/07/2018   Procedure: COLONOSCOPY WITH PROPOFOL;  Surgeon: Lollie Sails, MD;  Location: [facility] ENDOSCOPY;  Service: Endoscopy;  Laterality: N/A;   CORONARY ARTERY BYPASS GRAFT  01/07/1997   CORONARY STENT INTERVENTION N/A 10/31/2016   Procedure: Coronary Stent Intervention;  Surgeon: Wellington Hampshire, MD;  Location: [place] CV LAB;  Service: Cardiovascular;   Laterality: N/A;   ELECTROPHYSIOLOGIC STUDY N/A 07/14/2015   Procedure: CARDIOVERSION;  Surgeon: Yolonda Kida, MD;  Location: [facility] ORS;  Service: Cardiovascular;  Laterality: N/A;   ELECTROPHYSIOLOGIC STUDY N/A 10/12/2015   Procedure: CARDIOVERSION;  Surgeon: Minna Merritts, MD;  Location: [facility] ORS;  Service: Cardiovascular;  Laterality: N/A;   LEFT HEART CATH AND CORONARY ANGIOGRAPHY N/A 10/31/2016   Procedure: Left Heart Cath and Coronary Angiography;  Surgeon: Wellington Hampshire, MD;  Location: [place] CV LAB;  Service: Cardiovascular;  Laterality: N/A;   OTHER SURGICAL HISTORY  1998   Bypass   RIGHT/LEFT HEART CATH AND CORONARY ANGIOGRAPHY N/A 09/18/2017   Procedure: RIGHT/LEFT HEART CATH AND CORONARY ANGIOGRAPHY;  Surgeon: Wellington Hampshire, MD;  Location: [place] [facility];  Service: Cardiovascular;  Laterality: N/A;    Social History:   Social History   Socioeconomic History   Marital status: Married    Spouse name: [name]   Number of children: 1   Years of education: 12   Highest education level: Not on file  Occupational History   Occupation: Retired    Comment: Designer, television/film set  Tobacco Use   Smoking status: Former    Packs/day: 1.00    Years: 10.00    Pack years: 10.00    Types: Cigarettes, Pipe, Cigars    Quit date: 04/18/1972    Years since quitting: 49.0   Smokeless tobacco: Former    Types: Chew    Quit date: 04/18/1972  Vaping Use   Vaping Use: Never used  Substance and Sexual Activity   Alcohol use: Yes    Alcohol/week: 4.0 standard drinks    Types: 2 Cans of beer, 2 Shots of liquor per week    Comment: per 2 weeks    Drug use: No   Sexual activity: Yes  Other Topics Concern   Not on file  Social History Narrative   Lives w/ wife   Caffeine use: none   Right-handed   Social Determinants of Health   Financial Resource Strain: Not on file  Food Insecurity: Not on file  Transportation Needs: Not on file  Physical Activity: Not  on file  Stress: Not on file  Social Connections: Not on file  Intimate Partner Violence: Not on file    Allergies:   Allergies  Allergen Reactions   Pravastatin Other (See Comments)   Prednisone Other (See Comments)    Pt states that med makes him hyper Pt states that med makes him hyper    Family History:   Family History  Problem Relation Age of Onset   Alcohol abuse Father    Parkinson's disease Neg Hx      Current Medications:   Prior to Admission medications   Medication Sig Start Date End Date Taking? Authorizing Provider  buPROPion (WELLBUTRIN XL) 300 MG  24 hr tablet TAKE ONE TABLET BY MOUTH EVERY DAY Patient taking differently: Take 300 mg by mouth daily. 08/20/20  Yes [name], MD  carbidopa-levodopa (SINEMET IR) 25-250 MG tablet Take 1 tablet by mouth 4 (four) times daily. 03/04/21  Yes Star Age, MD  carvedilol (COREG) 3.125 MG tablet TAKE TWO TABLETS TWICE A DAY WITH MEALS Patient taking differently: Take 6.25 mg by mouth 2 (two) times daily with a meal. 12/04/20  Yes Arida, Mertie Clause, MD  ELIQUIS 5 MG TABS tablet TAKE ONE TABLET BY MOUTH TWICE DAILY Patient taking differently: Take 5 mg by mouth 2 (two) times daily. 04/06/21  Yes Wellington Hampshire, MD  entacapone (COMTAN) 200 MG tablet TAKE 1 TABLET BY MOUTH 3 TIMES DAILY Patient taking differently: Take 200 mg by mouth 3 (three) times daily. 07/16/20  Yes Kathrynn Ducking, MD  esomeprazole (NEXIUM) 40 MG capsule TAKE 1 CAPSULE BY MOUTH ONCE DAILY Patient taking differently: Take 40 mg by mouth daily. 03/18/21  Yes [name], MD  fluticasone (FLONASE) 50 MCG/ACT nasal spray USE 2 PUFFS IN EACH NOSTRIL DAILY Patient taking differently: Place 2 sprays into both nostrils daily. 03/09/21  Yes [name], MD  furosemide (LASIX) 20 MG tablet Take 1 tablet (20 mg total) by mouth daily. 01/13/21  Yes Arida, Mertie Clause, MD  GEMTESA 75 MG TABS TAKE 1 TABLET BY MOUTH DAILY Patient taking  differently: Take 75 mg by mouth daily. 02/22/21  Yes Stoioff, Ronda Fairly, MD  hydrocortisone 2.5 % lotion For seborrheic dermatitis of the face apply to aa's QAM on Tuesday, Thursday, and Saturday. 11/05/20  Yes Ralene Bathe, MD  ketoconazole (NIZORAL) 2 % cream Apply a thin coat to face QAM on Monday, Wednesday, and Friday. 11/05/20  Yes Ralene Bathe, MD  ketoconazole (NIZORAL) 2 % shampoo Shampoo into the face and scalp let sit 5 minutes then wash off. Use 3d/wk. 11/05/20  Yes Ralene Bathe, MD  lactulose (CHRONULAC) 10 GM/15ML solution TAKE 30 MLS BY MOUTH TWICE DAILY AS NEEDED FOR MODERATE CONSTIPATION Patient taking differently: Take 20 g by mouth 2 (two) times daily as needed for moderate constipation. 07/27/20  Yes Crecencio Mc, MD  levothyroxine (SYNTHROID) 50 MCG tablet TAKE ONE TABLET ON AN EMPTY STOMACH WITHA GLASS OF WATER AT LEAST 30 TO [place] BREAKFAST Patient taking differently: Take 50 mcg by mouth daily before breakfast. 08/20/20  Yes [name], MD  losartan (COZAAR) 100 MG tablet TAKE 1 TABLET BY MOUTH DAILY Patient taking differently: Take 100 mg by mouth daily. 11/13/20  Yes [name], MD  potassium chloride SA (KLOR-CON) 10 MEQ tablet Take 1 tablet (10 mEq total) by mouth daily. 12/12/19  Yes Wellington Hampshire, MD  QUEtiapine (SEROQUEL) 25 MG tablet Take 1 tablet (25 mg total) by mouth at bedtime. 1 tablet at night 03/16/21  Yes Star Age, MD  rosuvastatin (CRESTOR) 10 MG tablet TAKE 1 TABLET BY MOUTH DAILY Patient taking differently: Take 10 mg by mouth daily. 03/09/21  Yes Wellington Hampshire, MD  sertraline (ZOLOFT) 50 MG tablet TAKE 1 TABLET BY MOUTH DAILY Patient taking differently: Take 50 mg by mouth daily. 04/07/21  Yes [name], MD  silodosin (RAPAFLO) 8 MG CAPS capsule TAKE 1 CAPSULE BY MOUTH ONCE DAILY WITH BREAKFAST Patient taking differently: Take 8 mg by mouth daily with breakfast. 03/26/21  Yes Stoioff, Ronda Fairly, MD   albuterol (VENTOLIN HFA) 108 (90 Base)  MCG/ACT inhaler Inhale 2 puffs into the lungs every 6 (six) hours as needed for wheezing or shortness of breath. 04/08/21   Mannam, Hart Robinsons, MD  feeding supplement (ENSURE ENLIVE / ENSURE PLUS) LIQD Take 237 mLs by mouth 3 (three) times daily between meals. 05/07/20   Danford, Suann Larry, MD  Respiratory Therapy Supplies (FLUTTER) DEVI 1 Device by Does not apply route daily. 05/29/19   Martyn Ehrich, NP     Physical Exam:   Vitals:   04/11/21 1318 04/11/21 1545 04/11/21 1636 04/11/21 1700  BP: 119/68 (!) 142/72  (!) 147/76  Pulse: 65 72 75 72  Resp:  18 14 (!) 21  Temp:      TempSrc:      SpO2: 93% 96% 97% 100%  Weight:      Height:         General:  Appears calm and comfortable and is in NAD Cardiovascular:  RRR, no m/r/g.  Respiratory:   CTA bilaterally with no wheezes/rales/rhonchi.  Normal respiratory effort. Abdomen:  soft, NT, ND, NABS Skin:  no rash or induration seen on limited exam Musculoskeletal:  grossly normal tone BUE/BLE, good ROM, no bony abnormality Lower extremity:  No LE edema.  Limited foot exam with no ulcerations.  2+ distal pulses. Psychiatric:  grossly normal mood and affect, speech fluent and appropriate, AOx3 Neurologic:  CN 2-12 grossly intact, moves all extremities in coordinated fashion, sensation intact    Data Review:    Radiological Exams on Admission: Independently reviewed - see discussion in A/P where applicable  DG Chest Port 1 View  Result Date: 04/11/2021 CLINICAL DATA:  Shortness of breath.  Pacemaker. EXAM: PORTABLE CHEST 1 VIEW COMPARISON:  04/30/2020 plain film.  CT 10/23/2020. FINDINGS: Apical lordotic portable radiograph. Midline trachea. Mild cardiomegaly. Right-sided pleural thickening versus small volume fluid. No pneumothorax. Surgical clips about the medial left hemithorax. Increase in pulmonary interstitial prominence, accentuated by diminished lung volumes on the current exam.  Suspect bibasilar airspace disease. IMPRESSION: Limited portable radiograph in the setting of known pulmonary fibrosis and asbestos related pleural disease. Apparent increase in pulmonary interstitial prominence. This could represent pulmonary edema superimposed upon interstitial lung disease. Suspect concurrent bibasilar atelectasis or infection. Consider PA and lateral radiographs if feasible. Right-sided pleural thickening or trace fluid laterally. Electronically Signed   By: Abigail Miyamoto M.D.   On: 04/11/2021 12:21    EKG: Independently reviewed.  A. fib 86 bpm  Labs on Admission: I have personally reviewed the available labs and imaging studies at the time of the admission.  Pertinent labs on Admission: WBC 11.9, blood glucose 138, T bili 2.3, BNP 425     Assessment/Plan:    Acute hypoxic respiratory failure: Likely multifactorial due to pulmonary fibrosis and CHF decompensation.  Start scheduled IV Lasix.  Strict I's and O's, daily weights, fluid restriction and salt restricted diet.  Did not start any steroids for pulmonary fibrosis exacerbation due to allergy to prednisone.  We will give scheduled DuoNebs.  Looks comfortable on BiPAP now and looks like respiratory distress has improved.  We will trial him off BiPAP and see if we can get him weaned to nasal cannula.  Recent echocardiogram on 02/2021 showed EF of 55% with left ventricular diastolic pressures undetermined.  Holding home Lasix.  Atrial fibrillation: Continue home Eliquis.  Does not appear to be on any rate or rhythm control medication.  Currently rate controlled.  Hypertension: Continue home Cozaar  Dyslipidemia: Continue home Crestor  Hypothyroidism: Continue  home Synthroid  BPH/overactive bladder: Continue home Rapaflo and Gemtesa  GERD: Continue home Nexium  Parkinson's disease/dementia: Continue home Sinemet, Wellbutrin, Comtan, Seroquel and Zoloft  Sleep apnea: Continue CPAP  Obesity: BMI 36 per EMR. No acute  treatment   Other information:    Level of Care: Stepdown DVT prophylaxis: Eliquis Code Status: Full code Consults: None Admission status: Observation   Leslee Home DO [facility]   How to contact the [facility] Attending or Consulting provider [place] or covering provider during after hours [place], for this patient?  Check the care team in [facility] and look for a) attending/consulting [facility] provider listed and b) the [facility] team listed Log into [email or web address] and use [place]'s universal password to access. If you do not have the password, please contact the hospital operator. Locate the [facility] provider you are looking for under [facility] and page to a number that you can be directly reached. If you still have difficulty reaching the provider, please page the [facility] (Director on Call) for the Hospitalists listed on amion for assistance.   04/11/2021, 5:36 PM

## 2021-04-12 ENCOUNTER — Encounter: Payer: Self-pay | Admitting: Internal Medicine

## 2021-04-12 DIAGNOSIS — F32A Depression, unspecified: Secondary | ICD-10-CM | POA: Diagnosis present

## 2021-04-12 DIAGNOSIS — E785 Hyperlipidemia, unspecified: Secondary | ICD-10-CM | POA: Diagnosis present

## 2021-04-12 DIAGNOSIS — I700 Atherosclerosis of aorta: Secondary | ICD-10-CM | POA: Diagnosis present

## 2021-04-12 DIAGNOSIS — R0602 Shortness of breath: Secondary | ICD-10-CM | POA: Diagnosis present

## 2021-04-12 DIAGNOSIS — I2510 Atherosclerotic heart disease of native coronary artery without angina pectoris: Secondary | ICD-10-CM | POA: Diagnosis present

## 2021-04-12 DIAGNOSIS — J8410 Pulmonary fibrosis, unspecified: Secondary | ICD-10-CM | POA: Diagnosis present

## 2021-04-12 DIAGNOSIS — Z6836 Body mass index (BMI) 36.0-36.9, adult: Secondary | ICD-10-CM | POA: Diagnosis not present

## 2021-04-12 DIAGNOSIS — E032 Hypothyroidism due to medicaments and other exogenous substances: Secondary | ICD-10-CM | POA: Diagnosis present

## 2021-04-12 DIAGNOSIS — G4733 Obstructive sleep apnea (adult) (pediatric): Secondary | ICD-10-CM | POA: Diagnosis present

## 2021-04-12 DIAGNOSIS — Z7901 Long term (current) use of anticoagulants: Secondary | ICD-10-CM | POA: Diagnosis not present

## 2021-04-12 DIAGNOSIS — I110 Hypertensive heart disease with heart failure: Secondary | ICD-10-CM | POA: Diagnosis present

## 2021-04-12 DIAGNOSIS — N401 Enlarged prostate with lower urinary tract symptoms: Secondary | ICD-10-CM | POA: Diagnosis present

## 2021-04-12 DIAGNOSIS — Z79890 Hormone replacement therapy (postmenopausal): Secondary | ICD-10-CM | POA: Diagnosis not present

## 2021-04-12 DIAGNOSIS — L89159 Pressure ulcer of sacral region, unspecified stage: Secondary | ICD-10-CM | POA: Diagnosis present

## 2021-04-12 DIAGNOSIS — Z79899 Other long term (current) drug therapy: Secondary | ICD-10-CM | POA: Diagnosis not present

## 2021-04-12 DIAGNOSIS — K219 Gastro-esophageal reflux disease without esophagitis: Secondary | ICD-10-CM | POA: Diagnosis present

## 2021-04-12 DIAGNOSIS — I480 Paroxysmal atrial fibrillation: Secondary | ICD-10-CM | POA: Diagnosis present

## 2021-04-12 DIAGNOSIS — I5033 Acute on chronic diastolic (congestive) heart failure: Secondary | ICD-10-CM | POA: Diagnosis present

## 2021-04-12 DIAGNOSIS — Z20822 Contact with and (suspected) exposure to covid-19: Secondary | ICD-10-CM | POA: Diagnosis present

## 2021-04-12 DIAGNOSIS — F0280 Dementia in other diseases classified elsewhere without behavioral disturbance: Secondary | ICD-10-CM | POA: Diagnosis present

## 2021-04-12 DIAGNOSIS — Z86711 Personal history of pulmonary embolism: Secondary | ICD-10-CM | POA: Diagnosis not present

## 2021-04-12 DIAGNOSIS — N3281 Overactive bladder: Secondary | ICD-10-CM | POA: Diagnosis present

## 2021-04-12 DIAGNOSIS — E669 Obesity, unspecified: Secondary | ICD-10-CM | POA: Diagnosis present

## 2021-04-12 DIAGNOSIS — J9601 Acute respiratory failure with hypoxia: Secondary | ICD-10-CM | POA: Diagnosis present

## 2021-04-12 DIAGNOSIS — G20 Parkinson's disease: Secondary | ICD-10-CM | POA: Diagnosis present

## 2021-04-12 DIAGNOSIS — I509 Heart failure, unspecified: Secondary | ICD-10-CM

## 2021-04-12 LAB — CBC
HCT: 43.8 % (ref 39.0–52.0)
Hemoglobin: 14.1 g/dL (ref 13.0–17.0)
MCH: 30.2 pg (ref 26.0–34.0)
MCHC: 32.2 g/dL (ref 30.0–36.0)
MCV: 93.8 fL (ref 80.0–100.0)
Platelets: 116 10*3/uL — ABNORMAL LOW (ref 150–400)
RBC: 4.67 MIL/uL (ref 4.22–5.81)
RDW: 15 % (ref 11.5–15.5)
WBC: 8.6 10*3/uL (ref 4.0–10.5)
nRBC: 0 % (ref 0.0–0.2)

## 2021-04-12 LAB — TROPONIN I (HIGH SENSITIVITY)
Troponin I (High Sensitivity): 26 ng/L — ABNORMAL HIGH (ref <18)
Troponin I (High Sensitivity): 45 ng/L — ABNORMAL HIGH (ref <18)

## 2021-04-12 LAB — BASIC METABOLIC PANEL
Anion gap: 8 (ref 5–15)
BUN: 18 mg/dL (ref 8–23)
CO2: 30 mmol/L (ref 22–32)
Calcium: 8.9 mg/dL (ref 8.9–10.3)
Chloride: 100 mmol/L (ref 98–111)
Creatinine, Ser: 0.97 mg/dL (ref 0.61–1.24)
GFR, Estimated: >60 mL/min (ref >60)
Glucose, Bld: 122 mg/dL — ABNORMAL HIGH (ref 70–99)
Potassium: 4 mmol/L (ref 3.5–5.1)
Sodium: 138 mmol/L (ref 135–145)

## 2021-04-12 LAB — MAGNESIUM: Magnesium: 2 mg/dL (ref 1.7–2.4)

## 2021-04-12 NOTE — Progress Notes (Signed)
PROGRESS NOTE    Andre [name]  XAJ:[id number] DOB: 01/09/1936 DOA: 04/11/2021 PCP: [name], MD    Brief Narrative:  85 y.o. male with a past medical history of Parkinson's disease, sleep apnea on CPAP, coronary artery disease, CHF, hypertension, hyperlipidemia, pulmonary fibrosis, former tobacco dependence, hypothyroidism, paroxysmal atrial fibrillation on Eliquis, GERD.   This patient presents to the emergency department with shortness of breath for the past 2 days.  Denies any cough.  Was noted to have wheezing when EMS arrived.  Denies any fevers or chills.  No chest pain.  No nausea vomiting or diarrhea.  No abdominal pain.  Endorses some orthopnea.  Does have 1+ edema in the lower extremities.  Not on home oxygen at baseline.  Placed on BiPAP in the emergency department.  Upon my evaluation he states he is feeling better.  States his shortness of breath was worse and he could only get a few words out but after breathing treatments in the emergency department he is now able to talk in full sentences   Assessment & Plan:   Principal Problem:   Acute exacerbation of CHF (congestive heart failure) ([place]) Active Problems:   Acute decompensated heart failure ([place])  Acute hypoxic respiratory failure:  Likely multifactorial due to pulmonary fibrosis and CHF decompensation.   BNP elevated to 427 Plan: IV Lasix Strict ins and outs Daily weights Fluid and salt restriction Scheduled DuoNebs BiPAP as needed   Atrial fibrillation:  Continue home Eliquis.   Does not appear to be on any rate or rhythm control medication.   Currently rate controlled.   Hypertension:  Continue home Cozaar   Dyslipidemia:  Continue home Crestor   Hypothyroidism:  Continue home Synthroid   BPH/overactive bladder:  Continue home Rapaflo and Gemtesa   GERD:  Continue home Nexium   Parkinson's disease/dementia:  Continue home Sinemet, Wellbutrin, Comtan, Seroquel and Zoloft   Sleep  apnea:  Continue CPAP nightly   Obesity:  BMI 36 per EMR. No acute treatment  DVT prophylaxis: Eliquis Code Status: Full Family Communication: Spouse Javarius Tsosie [phone] on 12/26 Disposition Plan: Status is: Inpatient  Remains inpatient appropriate because: Acute decompensated heart failure, acute hypoxic respiratory failure secondary to AD HF and pulmonary fibrosis.  Possible discharge in 24 to 48 hours.       Level of care: Progressive  Consultants:  None  Procedures:  None  Antimicrobials: None   Subjective: Patient seen and examined in emergency room.  Reports objective improvement.  Poor sleep due to bed.  Respiratory status improved.  Objective: Vitals:   04/12/21 0930 04/12/21 1000 04/12/21 1200 04/12/21 1230  BP: (!) 114/93 138/70 110/60 115/68  Pulse: 63 68 65 68  Resp: (!) 21 20  20   Temp:      TempSrc:      SpO2: 96% 98% 97% 97%  Weight:      Height:        Intake/Output Summary (Last 24 hours) at 04/12/2021 1311 Last data filed at 04/12/2021 0706 Gross per 24 hour  Intake 240 ml  Output 1750 ml  Net -1510 ml   Filed Weights   04/11/21 1151  Weight: 105.4 kg    Examination:  General exam: No acute distress Respiratory system: Coarse breath sounds bilaterally.  Normal work of breathing.  3 L Cardiovascular system: S1-S2, tachycardic, regular rhythm, no murmurs, no pedal edema Gastrointestinal system: Obese, NT/ND, normal bowel sounds Central nervous system: Alert and oriented. No focal neurological deficits. Extremities:  Symmetric 5 x 5 power. Skin: No rashes, lesions or ulcers Psychiatry: Judgement and insight appear normal. Mood & affect appropriate.     Data Reviewed: I have personally reviewed following labs and imaging studies  CBC: Recent Labs  Lab 04/11/21 1201 04/12/21 0631  WBC 11.9* 8.6  HGB 15.5 14.1  HCT 48.5 43.8  MCV 95.3 93.8  PLT 138* 299*   Basic Metabolic Panel: Recent Labs  Lab 04/11/21 1251   NA 138  K 4.3  CL 105  CO2 26  GLUCOSE 138*  BUN 17  CREATININE 0.88  CALCIUM 8.5*   GFR: Estimated Creatinine Clearance: 73.6 mL/min (by C-G formula based on SCr of 0.88 mg/dL). Liver Function Tests: Recent Labs  Lab 04/11/21 1251  AST 23  ALT 12  ALKPHOS 85  BILITOT 2.3*  PROT 6.6  ALBUMIN 3.4*   No results for input(s): LIPASE, AMYLASE in the last 168 hours. No results for input(s): AMMONIA in the last 168 hours. Coagulation Profile: No results for input(s): INR, PROTIME in the last 168 hours. Cardiac Enzymes: No results for input(s): CKTOTAL, CKMB, CKMBINDEX, TROPONINI in the last 168 hours. BNP (last 3 results) No results for input(s): PROBNP in the last 8760 hours. HbA1C: No results for input(s): HGBA1C in the last 72 hours. CBG: No results for input(s): GLUCAP in the last 168 hours. Lipid Profile: No results for input(s): CHOL, HDL, LDLCALC, TRIG, CHOLHDL, LDLDIRECT in the last 72 hours. Thyroid Function Tests: No results for input(s): TSH, T4TOTAL, FREET4, T3FREE, THYROIDAB in the last 72 hours. Anemia Panel: No results for input(s): VITAMINB12, FOLATE, FERRITIN, TIBC, IRON, RETICCTPCT in the last 72 hours. Sepsis Labs: No results for input(s): PROCALCITON, LATICACIDVEN in the last 168 hours.  Recent Results (from the past 240 hour(s))  Resp Panel by RT-PCR (Flu A&B, Covid) Nasopharyngeal Swab     Status: None   Collection Time: 04/11/21 12:01 PM   Specimen: Nasopharyngeal Swab; Nasopharyngeal(NP) swabs in vial transport medium  Result Value Ref Range Status   SARS Coronavirus 2 by RT PCR NEGATIVE NEGATIVE Final    Comment: (NOTE) SARS-CoV-2 target nucleic acids are NOT DETECTED.  The SARS-CoV-2 RNA is generally detectable in upper respiratory specimens during the acute phase of infection. The lowest concentration of SARS-CoV-2 viral copies this assay can detect is 138 copies/mL. A negative result does not preclude SARS-Cov-2 infection and should not  be used as the sole basis for treatment or other patient management decisions. A negative result may occur with  improper specimen collection/handling, submission of specimen other than nasopharyngeal swab, presence of viral mutation(s) within the areas targeted by this assay, and inadequate number of viral copies(<138 copies/mL). A negative result must be combined with clinical observations, patient history, and epidemiological information. The expected result is Negative.  Fact Sheet for Patients:  EntrepreneurPulse.com.au  Fact Sheet for Healthcare Providers:  IncredibleEmployment.be  This test is no t yet approved or cleared by the [place] FDA and  has been authorized for detection and/or diagnosis of SARS-CoV-2 by FDA under an Emergency Use Authorization (EUA). This EUA will remain  in effect (meaning this test can be used) for the duration of the COVID-19 declaration under Section 564(b)(1) of the Act, 21 U.S.C.section 360bbb-3(b)(1), unless the authorization is terminated  or revoked sooner.       Influenza A by PCR NEGATIVE NEGATIVE Final   Influenza B by PCR NEGATIVE NEGATIVE Final    Comment: (NOTE) The Xpert Xpress SARS-CoV-2/FLU/RSV plus assay is intended  as an aid in the diagnosis of influenza from Nasopharyngeal swab specimens and should not be used as a sole basis for treatment. Nasal washings and aspirates are unacceptable for Xpert Xpress SARS-CoV-2/FLU/RSV testing.  Fact Sheet for Patients: EntrepreneurPulse.com.au  Fact Sheet for Healthcare Providers: IncredibleEmployment.be  This test is not yet approved or cleared by the [place] FDA and has been authorized for detection and/or diagnosis of SARS-CoV-2 by FDA under an Emergency Use Authorization (EUA). This EUA will remain in effect (meaning this test can be used) for the duration of the COVID-19 declaration under Section  564(b)(1) of the Act, 21 U.S.C. section 360bbb-3(b)(1), unless the authorization is terminated or revoked.  Performed at [facility], [address] [place], [place] [address]          Radiology Studies: [facility] Chest [place] 1 View  Result Date: 04/11/2021 CLINICAL DATA:  Shortness of breath.  Pacemaker. EXAM: PORTABLE CHEST 1 VIEW COMPARISON:  04/30/2020 plain film.  CT 10/23/2020. FINDINGS: Apical lordotic portable radiograph. Midline trachea. Mild cardiomegaly. Right-sided pleural thickening versus small volume fluid. No pneumothorax. Surgical clips about the medial left hemithorax. Increase in pulmonary interstitial prominence, accentuated by diminished lung volumes on the current exam. Suspect bibasilar airspace disease. IMPRESSION: Limited portable radiograph in the setting of known pulmonary fibrosis and asbestos related pleural disease. Apparent increase in pulmonary interstitial prominence. This could represent pulmonary edema superimposed upon interstitial lung disease. Suspect concurrent bibasilar atelectasis or infection. Consider PA and lateral radiographs if feasible. Right-sided pleural thickening or trace fluid laterally. Electronically Signed   By: Abigail Miyamoto M.D.   On: 04/11/2021 12:21        Scheduled Meds:  apixaban  5 mg Oral BID   buPROPion  300 mg Oral Daily   carbidopa-levodopa  1 tablet Oral QID   carvedilol  6.25 mg Oral BID WC   entacapone  200 mg Oral TID   fluticasone  2 spray Each Nare Daily   furosemide  40 mg Intravenous Daily   ipratropium-albuterol  3 mL Nebulization Q6H   levothyroxine  50 mcg Oral Q0600   losartan  100 mg Oral Daily   pantoprazole  80 mg Oral Q1200   potassium chloride  10 mEq Oral Daily   QUEtiapine  25 mg Oral QHS   rosuvastatin  10 mg Oral Daily   sertraline  50 mg Oral Daily   tamsulosin  0.4 mg Oral QPC breakfast   Vibegron  75 mg Oral Daily   Continuous Infusions:   LOS: 0 days    Time spent: 35  minutes    Sidney Ace, MD [facility]   If 7PM-7AM, please contact night-coverage  04/12/2021, 1:11 PM

## 2021-04-12 NOTE — ED Notes (Signed)
Dr. Priscella Mann at bedside at this time.

## 2021-04-12 NOTE — Progress Notes (Signed)
Patient has Parkinson's and increasing weakness, can't put shoes on

## 2021-04-12 NOTE — ED Notes (Signed)
Pt repositioned in the bed at this time. Breakfast meal tray given. Pt breakfast set up at this time.

## 2021-04-12 NOTE — Plan of Care (Signed)

## 2021-04-13 DIAGNOSIS — L8990 Pressure ulcer of unspecified site, unspecified stage: Secondary | ICD-10-CM | POA: Insufficient documentation

## 2021-04-13 MED ORDER — FUROSEMIDE 40 MG PO TABS: 40.0000 mg | ORAL_TABLET | Freq: Every day | ORAL | 0 refills | Status: DC

## 2021-04-13 NOTE — Discharge Summary (Signed)
Physician Discharge Summary  Andre [name] LPF:[id number] DOB: July 17, 1935 DOA: 04/11/2021  PCP: [name], MD  Admit date: 04/11/2021 Discharge date: 04/13/2021  Admitted From: Home Disposition: Home  Recommendations for Outpatient Follow-up:  Follow up with PCP in 1-2 weeks Follow-up with cardiology as directed Follow-up with pulmonology as directed  Home Health: No Equipment/Devices: None  Discharge Condition: Stable CODE STATUS: Full Diet recommendation: Heart healthy  Brief/Interim Summary:  85 y.o. male with a past medical history of Parkinson's disease, sleep apnea on CPAP, coronary artery disease, CHF, hypertension, hyperlipidemia, pulmonary fibrosis, former tobacco dependence, hypothyroidism, paroxysmal atrial fibrillation on Eliquis, GERD.   This patient presents to the emergency department with shortness of breath for the past 2 days.  Denies any cough.  Was noted to have wheezing when EMS arrived.  Denies any fevers or chills.  No chest pain.  No nausea vomiting or diarrhea.  No abdominal pain.  Endorses some orthopnea.  Does have 1+ edema in the lower extremities.  Not on home oxygen at baseline.  Placed on BiPAP in the emergency department.  Upon my evaluation he states he is feeling better.  States his shortness of breath was worse and he could only get a few words out but after breathing treatments in the emergency department he is now able to talk in full sentences  On day of discharge patient is mentating clearly.  Normal work of breathing.  Was on 2 L as of this morning then weaned off.  Ambulated without desaturation.  At time of discharge we will increase home dose of Lasix from 20 mg daily to 40.  Notified patient's pulmonologist and cardiologist patient discharged and change in discharge medication reconciliation.  Patient stable for discharge home.  Offered home health services but he declined.  Will follow up with PCP, pulmonology, cardiology   Discharge  Diagnoses:  Principal Problem:   Acute exacerbation of CHF (congestive heart failure) ([place]) Active Problems:   Acute decompensated heart failure ([place])   Pressure injury of skin  Acute hypoxic respiratory failure:  Likely multifactorial due to pulmonary fibrosis and CHF decompensation.   BNP elevated to 427 Received IV Lasix in house Net negative with euvolemia at time of discharge  Plan: discharge home.  Resume home nightly CPAP regimen. Increase home dose of Lasix from 20 mg to 40 daily Follow-up outpatient cardiology, PCP, pulmonary    Atrial fibrillation:  Continue home Eliquis.   Does not appear to be on any rate or rhythm control medication.   Currently rate controlled.   Hypertension:  Continue home Cozaar   Dyslipidemia:  Continue home Crestor   Hypothyroidism:  Continue home Synthroid   BPH/overactive bladder:  Continue home Rapaflo and Gemtesa   GERD:  Continue home Nexium   Parkinson's disease/dementia:  Continue home Sinemet, Wellbutrin, Comtan, Seroquel and Zoloft   Sleep apnea:  Continue CPAP nightly   Obesity:  BMI 36 per EMR. No acute treatment  Discharge Instructions  Discharge Instructions     Diet - low sodium heart healthy   Complete by: As directed    Increase activity slowly   Complete by: As directed    No wound care   Complete by: As directed       Allergies as of 04/13/2021       Reactions   Pravastatin Other (See Comments)   Prednisone Other (See Comments)   Pt states that med makes him hyper Pt states that med makes him hyper  Medication List     TAKE these medications    albuterol 108 (90 Base) MCG/ACT inhaler Commonly known as: VENTOLIN HFA Inhale 2 puffs into the lungs every 6 (six) hours as needed for wheezing or shortness of breath.   buPROPion 300 MG 24 hr tablet Commonly known as: WELLBUTRIN XL TAKE ONE TABLET BY MOUTH EVERY DAY   carbidopa-levodopa 25-250 MG tablet Commonly known as: SINEMET  IR Take 1 tablet by mouth 4 (four) times daily.   carvedilol 3.125 MG tablet Commonly known as: COREG TAKE TWO TABLETS TWICE A DAY WITH MEALS What changed: See the new instructions.   Eliquis 5 MG Tabs tablet Generic drug: apixaban TAKE ONE TABLET BY MOUTH TWICE DAILY   entacapone 200 MG tablet Commonly known as: COMTAN TAKE 1 TABLET BY MOUTH 3 TIMES DAILY   esomeprazole 40 MG capsule Commonly known as: NEXIUM TAKE 1 CAPSULE BY MOUTH ONCE DAILY   feeding supplement Liqd Take 237 mLs by mouth 3 (three) times daily between meals.   fluticasone 50 MCG/ACT nasal spray Commonly known as: FLONASE USE 2 PUFFS IN EACH NOSTRIL DAILY What changed: See the new instructions.   Flutter Devi 1 Device by Does not apply route daily.   furosemide 40 MG tablet Commonly known as: LASIX Take 1 tablet (40 mg total) by mouth daily. What changed:  medication strength how much to take   Gemtesa 75 MG Tabs Generic drug: Vibegron TAKE 1 TABLET BY MOUTH DAILY What changed: how much to take   hydrocortisone 2.5 % lotion For seborrheic dermatitis of the face apply to aa's QAM on Tuesday, Thursday, and Saturday.   ketoconazole 2 % cream Commonly known as: NIZORAL Apply a thin coat to face QAM on Monday, Wednesday, and Friday.   ketoconazole 2 % shampoo Commonly known as: NIZORAL Shampoo into the face and scalp let sit 5 minutes then wash off. Use 3d/wk.   lactulose 10 GM/15ML solution Commonly known as: [place] 30 MLS BY MOUTH TWICE DAILY AS NEEDED FOR MODERATE CONSTIPATION What changed: See the new instructions.   levothyroxine 50 MCG tablet Commonly known as: SYNTHROID TAKE ONE TABLET ON AN EMPTY STOMACH WITHA GLASS OF WATER AT LEAST 30 TO 60 MINUTES BEFORE BREAKFAST What changed: See the new instructions.   losartan 100 MG tablet Commonly known as: COZAAR TAKE 1 TABLET BY MOUTH DAILY   potassium chloride 10 MEQ tablet Commonly known as: KLOR-CON M Take 1 tablet (10  mEq total) by mouth daily.   QUEtiapine 25 MG tablet Commonly known as: SEROQUEL Take 1 tablet (25 mg total) by mouth at bedtime. 1 tablet at night   rosuvastatin 10 MG tablet Commonly known as: CRESTOR TAKE 1 TABLET BY MOUTH DAILY   sertraline 50 MG tablet Commonly known as: ZOLOFT TAKE 1 TABLET BY MOUTH DAILY   silodosin 8 MG Caps capsule Commonly known as: RAPAFLO TAKE 1 CAPSULE BY MOUTH ONCE DAILY WITH BREAKFAST        Follow-up Information     [name], MD. Schedule an appointment as soon as possible for a visit in 1 week(s).   Specialty: Family Medicine Contact information: [address] [place] [address] [phone]         Wellington Hampshire, MD .   Specialty: Cardiology Contact information: [place] [address] [phone]         Ottie Glazier, MD. Schedule an appointment as soon as possible for a visit  in 2 week(s).   Specialty: Pulmonary Disease Contact information: [place] [address] [phone]                Allergies  Allergen Reactions   Pravastatin Other (See Comments)   Prednisone Other (See Comments)    Pt states that med makes him hyper Pt states that med makes him hyper    Consultations: None   Procedures/Studies: DG Chest Port 1 View  Result Date: 04/11/2021 CLINICAL DATA:  Shortness of breath.  Pacemaker. EXAM: PORTABLE CHEST 1 VIEW COMPARISON:  04/30/2020 plain film.  CT 10/23/2020. FINDINGS: Apical lordotic portable radiograph. Midline trachea. Mild cardiomegaly. Right-sided pleural thickening versus small volume fluid. No pneumothorax. Surgical clips about the medial left hemithorax. Increase in pulmonary interstitial prominence, accentuated by diminished lung volumes on the current exam. Suspect bibasilar airspace disease. IMPRESSION: Limited portable radiograph in the setting of known pulmonary fibrosis and asbestos related  pleural disease. Apparent increase in pulmonary interstitial prominence. This could represent pulmonary edema superimposed upon interstitial lung disease. Suspect concurrent bibasilar atelectasis or infection. Consider PA and lateral radiographs if feasible. Right-sided pleural thickening or trace fluid laterally. Electronically Signed   By: Abigail Miyamoto M.D.   On: 04/11/2021 12:21      Subjective: And examined at the time of discharge.  Stable no distress.  Work of breathing at baseline.  Discharge Exam: Vitals:   04/13/21 0839 04/13/21 1000  BP:  111/62  Pulse: (!) 59 61  Resp: 16   Temp:  97.7 F (36.5 C)  SpO2: 98% 92%   Vitals:   04/13/21 0401 04/13/21 0700 04/13/21 0839 04/13/21 1000  BP: (!) 112/56   111/62  Pulse: 80  (!) 59 61  Resp: 17  16   Temp: 98.8 F (37.1 C)   97.7 F (36.5 C)  TempSrc:    Oral  SpO2: 91%  98% 92%  Weight:  106.1 kg    Height:        General: Pt is alert, awake, not in acute distress Cardiovascular: RRR, S1/S2 +, no rubs, no gallops Respiratory: CTA bilaterally, no wheezing, no rhonchi Abdominal: Soft, NT, ND, bowel sounds + Extremities: no edema, no cyanosis    The results of significant diagnostics from this hospitalization (including imaging, microbiology, ancillary and laboratory) are listed below for reference.     Microbiology: Recent Results (from the past 240 hour(s))  Resp Panel by RT-PCR (Flu A&B, Covid) Nasopharyngeal Swab     Status: None   Collection Time: 04/11/21 12:01 PM   Specimen: Nasopharyngeal Swab; Nasopharyngeal(NP) swabs in vial transport medium  Result Value Ref Range Status   SARS Coronavirus 2 by RT PCR NEGATIVE NEGATIVE Final    Comment: (NOTE) SARS-CoV-2 target nucleic acids are NOT DETECTED.  The SARS-CoV-2 RNA is generally detectable in upper respiratory specimens during the acute phase of infection. The lowest concentration of SARS-CoV-2 viral copies this assay can detect is 138 copies/mL. A  negative result does not preclude SARS-Cov-2 infection and should not be used as the sole basis for treatment or other patient management decisions. A negative result may occur with  improper specimen collection/handling, submission of specimen other than nasopharyngeal swab, presence of viral mutation(s) within the areas targeted by this assay, and inadequate number of viral copies(<138 copies/mL). A negative result must be combined with clinical observations, patient history, and epidemiological information. The expected result is Negative.  Fact Sheet for Patients:  EntrepreneurPulse.com.au  Fact Sheet for Healthcare Providers:  IncredibleEmployment.be  This test is no t yet approved or cleared by the [place] and  has been authorized for detection and/or diagnosis of SARS-CoV-2 by FDA under an Emergency Use Authorization (EUA). This EUA will remain  in effect (meaning this test can be used) for the duration of the COVID-19 declaration under Section 564(b)(1) of the Act, 21 U.S.C.section 360bbb-3(b)(1), unless the authorization is terminated  or revoked sooner.       Influenza A by PCR NEGATIVE NEGATIVE Final   Influenza B by PCR NEGATIVE NEGATIVE Final    Comment: (NOTE) The Xpert Xpress SARS-CoV-2/FLU/RSV plus assay is intended as an aid in the diagnosis of influenza from Nasopharyngeal swab specimens and should not be used as a sole basis for treatment. Nasal washings and aspirates are unacceptable for Xpert Xpress SARS-CoV-2/FLU/RSV testing.  Fact Sheet for Patients: EntrepreneurPulse.com.au  Fact Sheet for Healthcare Providers: IncredibleEmployment.be  This test is not yet approved or cleared by the [place] FDA and has been authorized for detection and/or diagnosis of SARS-CoV-2 by FDA under an Emergency Use Authorization (EUA). This EUA will remain in effect (meaning this test can  be used) for the duration of the COVID-19 declaration under Section 564(b)(1) of the Act, 21 U.S.C. section 360bbb-3(b)(1), unless the authorization is terminated or revoked.  Performed at [facility], [address] [place], [place] [address]      Labs: BNP (last 3 results) Recent Labs    04/30/20 0847 04/11/21 1251  BNP 154.0* 027.2*   Basic Metabolic Panel: Recent Labs  Lab 04/11/21 1251 04/12/21 1449  NA 138 138  K 4.3 4.0  CL 105 100  CO2 26 30  GLUCOSE 138* 122*  BUN 17 18  CREATININE 0.88 0.97  CALCIUM 8.5* 8.9  MG  --  2.0   Liver Function Tests: Recent Labs  Lab 04/11/21 1251  AST 23  ALT 12  ALKPHOS 85  BILITOT 2.3*  PROT 6.6  ALBUMIN 3.4*   No results for input(s): LIPASE, AMYLASE in the last 168 hours. No results for input(s): AMMONIA in the last 168 hours. CBC: Recent Labs  Lab 04/11/21 1201 04/12/21 0631  WBC 11.9* 8.6  HGB 15.5 14.1  HCT 48.5 43.8  MCV 95.3 93.8  PLT 138* 116*   Cardiac Enzymes: No results for input(s): CKTOTAL, CKMB, CKMBINDEX, TROPONINI in the last 168 hours. BNP: Invalid input(s): POCBNP CBG: No results for input(s): GLUCAP in the last 168 hours. D-Dimer No results for input(s): DDIMER in the last 72 hours. Hgb A1c No results for input(s): HGBA1C in the last 72 hours. Lipid Profile No results for input(s): CHOL, HDL, LDLCALC, TRIG, CHOLHDL, LDLDIRECT in the last 72 hours. Thyroid function studies No results for input(s): TSH, T4TOTAL, T3FREE, THYROIDAB in the last 72 hours.  Invalid input(s): FREET3 Anemia work up No results for input(s): VITAMINB12, FOLATE, FERRITIN, TIBC, IRON, RETICCTPCT in the last 72 hours. Urinalysis    Component Value Date/Time   COLORURINE YELLOW 05/25/2020 1525   APPEARANCEUR Clear 07/09/2020 1501   LABSPEC 1.020 05/25/2020 1525   PHURINE 6.0 05/25/2020 1525   GLUCOSEU Trace (A) 07/09/2020 1501   GLUCOSEU NEGATIVE 05/25/2020 1525   HGBUR NEGATIVE 05/25/2020 1525    BILIRUBINUR Negative 07/09/2020 1501   KETONESUR TRACE (A) 05/25/2020 1525   PROTEINUR Negative 07/09/2020 1501   PROTEINUR NEGATIVE 04/30/2020 1013   UROBILINOGEN 0.2 05/25/2020 1525   NITRITE Negative 07/09/2020 1501   NITRITE NEGATIVE 05/25/2020 1525   LEUKOCYTESUR Trace (  A) 07/09/2020 1501   LEUKOCYTESUR TRACE (A) 05/25/2020 1525   Sepsis Labs Invalid input(s): PROCALCITONIN,  WBC,  LACTICIDVEN Microbiology Recent Results (from the past 240 hour(s))  Resp Panel by RT-PCR (Flu A&B, Covid) Nasopharyngeal Swab     Status: None   Collection Time: 04/11/21 12:01 PM   Specimen: Nasopharyngeal Swab; Nasopharyngeal(NP) swabs in vial transport medium  Result Value Ref Range Status   SARS Coronavirus 2 by RT PCR NEGATIVE NEGATIVE Final    Comment: (NOTE) SARS-CoV-2 target nucleic acids are NOT DETECTED.  The SARS-CoV-2 RNA is generally detectable in upper respiratory specimens during the acute phase of infection. The lowest concentration of SARS-CoV-2 viral copies this assay can detect is 138 copies/mL. A negative result does not preclude SARS-Cov-2 infection and should not be used as the sole basis for treatment or other patient management decisions. A negative result may occur with  improper specimen collection/handling, submission of specimen other than nasopharyngeal swab, presence of viral mutation(s) within the areas targeted by this assay, and inadequate number of viral copies(<138 copies/mL). A negative result must be combined with clinical observations, patient history, and epidemiological information. The expected result is Negative.  Fact Sheet for Patients:  EntrepreneurPulse.com.au  Fact Sheet for Healthcare Providers:  IncredibleEmployment.be  This test is no t yet approved or cleared by the [place] FDA and  has been authorized for detection and/or diagnosis of SARS-CoV-2 by FDA under an Emergency Use Authorization (EUA).  This EUA will remain  in effect (meaning this test can be used) for the duration of the COVID-19 declaration under Section 564(b)(1) of the Act, 21 U.S.C.section 360bbb-3(b)(1), unless the authorization is terminated  or revoked sooner.       Influenza A by PCR NEGATIVE NEGATIVE Final   Influenza B by PCR NEGATIVE NEGATIVE Final    Comment: (NOTE) The Xpert Xpress SARS-CoV-2/FLU/RSV plus assay is intended as an aid in the diagnosis of influenza from Nasopharyngeal swab specimens and should not be used as a sole basis for treatment. Nasal washings and aspirates are unacceptable for Xpert Xpress SARS-CoV-2/FLU/RSV testing.  Fact Sheet for Patients: EntrepreneurPulse.com.au  Fact Sheet for Healthcare Providers: IncredibleEmployment.be  This test is not yet approved or cleared by the [place] FDA and has been authorized for detection and/or diagnosis of SARS-CoV-2 by FDA under an Emergency Use Authorization (EUA). This EUA will remain in effect (meaning this test can be used) for the duration of the COVID-19 declaration under Section 564(b)(1) of the Act, 21 U.S.C. section 360bbb-3(b)(1), unless the authorization is terminated or revoked.  Performed at [facility], [address], [place], [place] [address]      Time coordinating discharge: Over 30 minutes  SIGNED:   Sidney Ace, MD  [facility] 04/13/2021, 11:32 AM Pager   If 7PM-7AM, please contact night-coverage

## 2021-04-13 NOTE — Evaluation (Signed)
Physical Therapy Evaluation Patient Details Name: DAILEN MCCLISH MRN: [id number] DOB: Aug 25, 1935 Today's Date: 04/13/2021  History of Present Illness  85 y.o. male with a past medical history of Parkinson's disease, sleep apnea on CPAP, coronary artery disease, CHF, hypertension, hyperlipidemia, pulmonary fibrosis, former tobacco dependence, hypothyroidism, paroxysmal atrial fibrillation on Eliquis, GERD.     This patient presents to the emergency department with shortness of breath.  Admitted with CHF exacerbation, placed on O2.  Clinical Impression  Pt was pleasant and eager to work with PT and get up to recliner.  He showed good confidence with bed mobility and and getting to standing and though he was on (and seemed to need) O2 the entire session he never had excessive shortness of breath or fatigue.  Overall he showed ability to circumambulate the nurses' station safely and w/o AD some of the time.  O2 sats in the low 90s on 2.5L on arrival, increased to high 90s on 3L during ambulation, but back to 91-94% range on 2L during ambulation.  Pt reports feeling a little weak but close to his baseline regarding ambulation/mobility, discussed O2 need/sats with nurse post session.  Pt admits he is a little weaker and more fatigued than baseline but not convinced that he will need HHPT at d/c, recommending at this time per continued progress and O2 status.      Recommendations for follow up therapy are one component of a multi-disciplinary discharge planning process, led by the attending physician.  Recommendations may be updated based on patient status, additional functional criteria and insurance authorization.  Follow Up Recommendations Home health PT (pt reports if he is breathing better at d/c that he does not feel he'll need PT)    Assistance Recommended at Discharge PRN  Functional Status Assessment Patient has had a recent decline in their functional status and demonstrates the ability to make  significant improvements in function in a reasonable and predictable amount of time.  Equipment Recommendations  None recommended by PT    Recommendations for Other Services       Precautions / Restrictions Precautions Precautions: Fall Restrictions Weight Bearing Restrictions: No      Mobility  Bed Mobility Overal bed mobility: Modified Independent             General bed mobility comments: Pt able to get himself to EOB w/o assist, light UE use on rails    Transfers Overall transfer level: Modified independent Equipment used: Rolling walker (2 wheels)               General transfer comment: Pt able to rise with modest UE use    Ambulation/Gait Ambulation/Gait assistance: Supervision Gait Distance (Feet): 200 Feet Assistive device: Rolling walker (2 wheels);None         General Gait Details: Pt was able to circumambulate the nurses' station while maintain consistent speed and minimal reliance on the walker, final 25 ft w/o UE/AD use and with no LOBs or overt safety issues. Pt with expected modest fatigue but no excessive SOB/DOE.  On O2 t/o the effort, initially on 3L with sats in the high 90s, dropped to 2L and generally staying in the 91-94% range.  Stairs            Wheelchair Mobility    Modified Rankin (Stroke Patients Only)       Balance Overall balance assessment: Modified Independent  Pertinent Vitals/Pain Pain Assessment: No/denies pain    Home Living Family/patient expects to be discharged to:: Private residence Living Arrangements: Spouse/significant other Available Help at Discharge: Family;Available 24 hours/day Type of Home: House Home Access: Stairs to enter Entrance Stairs-Rails: Can reach both;Left;Right Entrance Stairs-Number of Steps: 2   Home Layout: One level Home Equipment: Conservation officer, nature (2 wheels);Cane - single point      Prior Function Prior Level of  Function : Independent/Modified Independent                     Hand Dominance        Extremity/Trunk Assessment   Upper Extremity Assessment Upper Extremity Assessment: Generalized weakness;Overall [facility] for tasks assessed    Lower Extremity Assessment Lower Extremity Assessment: Generalized weakness;Overall WFL for tasks assessed       Communication   Communication: Expressive difficulties  Cognition Arousal/Alertness: Awake/alert Behavior During Therapy: WFL for tasks assessed/performed Overall Cognitive Status: Within Functional Limits for tasks assessed                                          General Comments General comments (skin integrity, edema, etc.): Pt's O2 in the low 90s on 2-2.5L t/o the session, some fatigue w/o a lot of shortness of breath during session    Exercises     Assessment/Plan    PT Assessment Patient needs continued PT services  PT Problem List Decreased strength;Decreased activity tolerance;Decreased balance;Decreased knowledge of use of DME;Decreased safety awareness;Cardiopulmonary status limiting activity       PT Treatment Interventions DME instruction;Gait training;Stair training;Functional mobility training;Therapeutic activities;Therapeutic exercise;Balance training;Neuromuscular re-education;Patient/family education    PT Goals (Current goals can be found in the Care Plan section)  Acute Rehab PT Goals Patient Stated Goal: go home PT Goal Formulation: With patient Time For Goal Achievement: 04/27/21 Potential to Achieve Goals: Good    Frequency Min 2X/week   Barriers to discharge        Co-evaluation               AM-PAC PT "6 Clicks" Mobility  Outcome Measure Help needed turning from your back to your side while in a flat bed without using bedrails?: None Help needed moving from lying on your back to sitting on the side of a flat bed without using bedrails?: None Help needed moving to and  from a bed to a chair (including a wheelchair)?: None Help needed standing up from a chair using your arms (e.g., wheelchair or bedside chair)?: None Help needed to walk in hospital room?: A Little Help needed climbing 3-5 steps with a railing? : A Little 6 Click Score: 22    End of Session Equipment Utilized During Treatment: Gait belt;Oxygen (2-3L) Activity Tolerance: Patient tolerated treatment well;Patient limited by fatigue Patient left: with chair alarm set;with call bell/phone within reach Nurse Communication: Mobility status PT Visit Diagnosis: Unsteadiness on feet (R26.81)    Time: 4628-6381 PT Time Calculation (min) (ACUTE ONLY): 28 min   Charges:   PT Evaluation $PT Eval Low Complexity: 1 Low PT Treatments $Gait Training: 8-22 mins        Kreg Shropshire, DPT 04/13/2021, 10:43 AM

## 2021-04-13 NOTE — TOC Transition Note (Addendum)
Transition of Care [facility]) - CM/SW Discharge Note   Patient Details  Name: Andre [name] MRN: [id number] Date of Birth: [birth year]  [facility] CM/SW Contact:  Alberteen Sam, LCSW Phone Number: 04/13/2021, 12:10 PM   Clinical Narrative:     Patient to discharge home today.   Reviewed PT and OT recs with patient and wife for home health services, they both declined home health at this time.     No further discharge needs at this time.     Final next level of care: Home/Self Care     Patient Goals and CMS Choice   CMS Medicare.gov Compare Post Acute Care list provided to:: Patient Choice offered to / list presented to : Patient  Discharge Placement                    Patient and family notified of of transfer: 04/13/21  Discharge Plan and Services                DME Arranged: Oxygen DME Agency: [employer] Date DME Agency Contacted: 04/13/21 Time DME Agency Contacted: [phone]              Social Determinants of Health ([place]) Interventions     Readmission Risk Interventions No flowsheet data found.

## 2021-04-13 NOTE — Evaluation (Signed)
Occupational Therapy Evaluation Patient Details Name: Andre [name] MRN: [id number] DOB: 05-19-1935 Today's Date: 04/13/2021   History of Present Illness 85 y.o. male with a past medical history of Parkinson's disease, sleep apnea on CPAP, coronary artery disease, CHF, hypertension, hyperlipidemia, pulmonary fibrosis, former tobacco dependence, hypothyroidism, paroxysmal atrial fibrillation on Eliquis, GERD.     This patient presents to the emergency department with shortness of breath.  Admitted with CHF exacerbation, placed on O2.   Clinical Impression   Pt was seen for OT evaluation this date. Prior to hospital admission, pt was living at home with his spouse. Pt received in recliner, attempting to remove cardiac monitor, nasal cannula and setting off chair alarm. Pt reports need to urinate and with cues from OT that he still has a catheter, he sat back down and was able to urinate. SpO2 in high 80's on room air. Pt educated in need for supplemental O2 at this time, educated in falls prevention, use of call bell the next time he needs to get up. RN notified. Currently pt demonstrates impairments as described below (See OT problem list) which functionally limit his ability to perform ADL/self-care tasks. Pt currently requires supervision for safety for LB ADL and ADL mobility. Pt would benefit from skilled OT services to address noted impairments and functional limitations (see below for any additional details) in order to maximize safety and independence while minimizing falls risk and caregiver burden. Upon hospital discharge, recommend HHOT to maximize pt safety and return to functional independence during meaningful occupations of daily life.     Recommendations for follow up therapy are one component of a multi-disciplinary discharge planning process, led by the attending physician.  Recommendations may be updated based on patient status, additional functional criteria and insurance authorization.    Follow Up Recommendations  Home health OT    Assistance Recommended at Discharge Intermittent Supervision/Assistance  Functional Status Assessment  Patient has had a recent decline in their functional status and demonstrates the ability to make significant improvements in function in a reasonable and predictable amount of time.  Equipment Recommendations  None recommended by OT    Recommendations for Other Services       Precautions / Restrictions Precautions Precautions: Fall Restrictions Weight Bearing Restrictions: No      Mobility Bed Mobility              General bed mobility comments: NT up in recliner    Transfers Overall transfer level: Modified independent Equipment used: Rolling walker (2 wheels)               General transfer comment: Pt able to rise with modest UE use      Balance Overall balance assessment: Mild deficits observed, not formally tested                                         ADL either performed or assessed with clinical judgement   ADL                                         General ADL Comments: supv for LB ADL tasks     Vision         Perception     Praxis      Pertinent Vitals/Pain Pain Assessment: No/denies  pain     Hand Dominance     Extremity/Trunk Assessment Upper Extremity Assessment Upper Extremity Assessment: Generalized weakness   Lower Extremity Assessment Lower Extremity Assessment: Generalized weakness       Communication Communication Communication: Expressive difficulties   Cognition Arousal/Alertness: Awake/alert Behavior During Therapy: WFL for tasks assessed/performed Overall Cognitive Status: No family/caregiver present to determine baseline cognitive functioning                                 General Comments: Pt in recliner, forgot that he had a catheter and was attempting to unhook his heart monitor and took off his O2      General Comments      Exercises     Shoulder Instructions      Home Living Family/patient expects to be discharged to:: Private residence Living Arrangements: Spouse/significant other Available Help at Discharge: Family;Available 24 hours/day Type of Home: House Home Access: Stairs to enter [employer] of Steps: 2 Entrance Stairs-Rails: Can reach both;Left;Right Home Layout: One level     Bathroom Shower/Tub: Tub/shower unit     Bathroom Accessibility: Yes   Home Equipment: Conservation officer, nature (2 wheels);Cane - single point          Prior Functioning/Environment Prior Level of Function : Independent/Modified Independent                        OT Problem List: Impaired balance (sitting and/or standing);Decreased safety awareness      OT Treatment/Interventions: Self-care/ADL training;Therapeutic exercise;Therapeutic activities;DME and/or AE instruction;Patient/family education;Balance training    OT Goals(Current goals can be found in the care plan section) Acute Rehab OT Goals Patient Stated Goal: go home today OT Goal Formulation: With patient Time For Goal Achievement: 04/27/21 Potential to Achieve Goals: Good ADL Goals Pt Will Perform Upper Body Dressing: with modified independence Pt Will Perform Lower Body Dressing: with modified independence Pt Will Transfer to Toilet: with modified independence  OT Frequency: Min 2X/week   Barriers to D/C:            Co-evaluation              AM-PAC OT "6 Clicks" Daily Activity     Outcome Measure Help from another person eating meals?: None Help from another person taking care of personal grooming?: None Help from another person toileting, which includes using toliet, bedpan, or urinal?: A Little Help from another person bathing (including washing, rinsing, drying)?: A Little Help from another person to put on and taking off regular upper body clothing?: None Help from another person to put  on and taking off regular lower body clothing?: A Little 6 Click Score: 21   End of Session Nurse Communication: Mobility status  Activity Tolerance: Patient tolerated treatment well Patient left: in chair;with call bell/phone within reach;with chair alarm set  OT Visit Diagnosis: Other abnormalities of gait and mobility (R26.89);Muscle weakness (generalized) (M62.81)                Time: 8889-1694 OT Time Calculation (min): 11 min Charges:  OT General Charges $OT Visit: 1 Visit OT Evaluation $OT Eval Low Complexity: 1 Low  Ardeth Perfect., MPH, MS, OTR/L ascom [phone] 04/13/21, 12:05 PM

## 2021-04-13 NOTE — Plan of Care (Signed)
DISCHARGE NOTE HOME EGE MUCKEY to be discharged home per MD order. Discussed follow up appointments with the patient and his wife. Medication list explained in detail. They verbalized understanding.  Skin clean, dry and intact without evidence of skin break down, no evidence of skin tears noted. IV catheter discontinued intact. Site without signs and symptoms of complications. Dressing and pressure applied. Pt denies pain at the site currently. No complaints noted.  Patient free of lines, drains, and wounds.   An After Visit Summary (AVS) was printed and given to the patient. Patient escorted via wheelchair, and discharged home via private auto.  Stephan Minister, RN

## 2021-04-14 ENCOUNTER — Emergency Department: Payer: HMO

## 2021-04-14 ENCOUNTER — Encounter: Payer: Self-pay | Admitting: Emergency Medicine

## 2021-04-14 ENCOUNTER — Other Ambulatory Visit: Payer: Self-pay

## 2021-04-14 ENCOUNTER — Telehealth: Payer: Self-pay | Admitting: Family Medicine

## 2021-04-14 DIAGNOSIS — Z5321 Procedure and treatment not carried out due to patient leaving prior to being seen by health care provider: Secondary | ICD-10-CM | POA: Diagnosis not present

## 2021-04-14 DIAGNOSIS — J811 Chronic pulmonary edema: Secondary | ICD-10-CM | POA: Diagnosis not present

## 2021-04-14 DIAGNOSIS — R296 Repeated falls: Secondary | ICD-10-CM | POA: Insufficient documentation

## 2021-04-14 DIAGNOSIS — W228XXA Striking against or struck by other objects, initial encounter: Secondary | ICD-10-CM | POA: Diagnosis not present

## 2021-04-14 DIAGNOSIS — R0602 Shortness of breath: Secondary | ICD-10-CM | POA: Diagnosis not present

## 2021-04-14 DIAGNOSIS — M2578 Osteophyte, vertebrae: Secondary | ICD-10-CM | POA: Diagnosis not present

## 2021-04-14 DIAGNOSIS — R519 Headache, unspecified: Secondary | ICD-10-CM | POA: Diagnosis not present

## 2021-04-14 DIAGNOSIS — S0990XA Unspecified injury of head, initial encounter: Secondary | ICD-10-CM | POA: Diagnosis not present

## 2021-04-14 DIAGNOSIS — I517 Cardiomegaly: Secondary | ICD-10-CM | POA: Diagnosis not present

## 2021-04-14 DIAGNOSIS — G20 Parkinson's disease: Secondary | ICD-10-CM | POA: Insufficient documentation

## 2021-04-14 DIAGNOSIS — J90 Pleural effusion, not elsewhere classified: Secondary | ICD-10-CM | POA: Diagnosis not present

## 2021-04-14 NOTE — Telephone Encounter (Signed)
Pt callback. Pt wife stated that she would like to see what Dr. [name] has to say about Pt fall. Pt requesting callback. [place] Ice,cma

## 2021-04-14 NOTE — Telephone Encounter (Signed)
I called and spoke with the wife of the patient and informed her that the provider feels he needs to be evaluated since he had the fall and he needs a CT scan as well, the wife agreed and stated she will take him to the ED.  Jahseh Lucchese,cma.

## 2021-04-14 NOTE — Telephone Encounter (Signed)
Pt callback. Pt wife stated that she would like to see what Dr. [name] has to say about Pt fall. Pt requesting callback.

## 2021-04-14 NOTE — Telephone Encounter (Signed)
He needs to be evaluated for this in the ED. He is on eliquis and will likely need to have his head scanned and they can evaluate the wound.

## 2021-04-14 NOTE — ED Triage Notes (Signed)
Fall, last night.  Hit head on concrete floor.  No  LOC.   Wife states patient has history of parkinson's, falls frequently.

## 2021-04-14 NOTE — Telephone Encounter (Signed)
Patient just got out of the hospital. Last night he fell in the garage and hit his head on the cement floor. Wound is on the top and towards the back of patient's head.Would is not bleeding at this time. Patient was transferred to Access Nurse.

## 2021-04-15 ENCOUNTER — Telehealth: Payer: Self-pay

## 2021-04-15 ENCOUNTER — Emergency Department
Admission: EM | Admit: 2021-04-15 | Discharge: 2021-04-15 | Disposition: A | Discharge status: Home / Self Care | Payer: HMO | Attending: Emergency Medicine | Admitting: Emergency Medicine

## 2021-04-15 NOTE — ED Notes (Signed)
No answer when called several times from lobby 

## 2021-04-15 NOTE — Telephone Encounter (Signed)
° ° ° °  Patient went to the ED late last night and was evaluated, Hadrian Yarbrough,cma

## 2021-04-15 NOTE — Telephone Encounter (Signed)
Transition Care Management Unsuccessful Follow-up Telephone Call  Date of discharge and from where:  04/13/21 [facility]  Attempts:  1st Attempt  Reason for unsuccessful TCM follow-up call:  Unable to reach patient.

## 2021-04-16 NOTE — Telephone Encounter (Signed)
Transition Care Management Unsuccessful Follow-up Telephone Call  Date of discharge and from where:  04/13/21 [facility]  Attempts:  3rd Attempt  Reason for unsuccessful TCM follow-up call:  Unable to reach patient. HFU scheduled 04/20/21 at 2:30. Keep all scheduled appointments.

## 2021-04-16 NOTE — Telephone Encounter (Signed)
Transition Care Management Unsuccessful Follow-up Telephone Call  Date of discharge and from where:  04/13/21 [facility]  Attempts:  2nd Attempt  Reason for unsuccessful TCM follow-up call:  No answer/busy

## 2021-04-20 ENCOUNTER — Other Ambulatory Visit: Payer: Self-pay

## 2021-04-20 ENCOUNTER — Encounter: Payer: Self-pay | Admitting: Family

## 2021-04-20 ENCOUNTER — Ambulatory Visit (INDEPENDENT_AMBULATORY_CARE_PROVIDER_SITE_OTHER): Payer: HMO | Admitting: Family

## 2021-04-20 ENCOUNTER — Emergency Department: Payer: HMO

## 2021-04-20 VITALS — BP 105/60 | HR 36 | Temp 97.9°F | Ht 67.0 in | Wt 231.6 lb

## 2021-04-20 DIAGNOSIS — R001 Bradycardia, unspecified: Secondary | ICD-10-CM | POA: Diagnosis not present

## 2021-04-20 DIAGNOSIS — I480 Paroxysmal atrial fibrillation: Secondary | ICD-10-CM | POA: Diagnosis not present

## 2021-04-20 DIAGNOSIS — R5383 Other fatigue: Secondary | ICD-10-CM | POA: Diagnosis not present

## 2021-04-20 DIAGNOSIS — I959 Hypotension, unspecified: Secondary | ICD-10-CM

## 2021-04-20 DIAGNOSIS — Z20822 Contact with and (suspected) exposure to covid-19: Secondary | ICD-10-CM | POA: Diagnosis not present

## 2021-04-20 DIAGNOSIS — R0602 Shortness of breath: Secondary | ICD-10-CM | POA: Diagnosis not present

## 2021-04-20 DIAGNOSIS — R42 Dizziness and giddiness: Secondary | ICD-10-CM | POA: Diagnosis not present

## 2021-04-20 DIAGNOSIS — J811 Chronic pulmonary edema: Secondary | ICD-10-CM | POA: Diagnosis not present

## 2021-04-20 DIAGNOSIS — J90 Pleural effusion, not elsewhere classified: Secondary | ICD-10-CM | POA: Diagnosis not present

## 2021-04-20 DIAGNOSIS — I5043 Acute on chronic combined systolic (congestive) and diastolic (congestive) heart failure: Secondary | ICD-10-CM | POA: Insufficient documentation

## 2021-04-20 DIAGNOSIS — I517 Cardiomegaly: Secondary | ICD-10-CM | POA: Diagnosis not present

## 2021-04-20 DIAGNOSIS — R531 Weakness: Secondary | ICD-10-CM | POA: Diagnosis not present

## 2021-04-20 DIAGNOSIS — I509 Heart failure, unspecified: Secondary | ICD-10-CM | POA: Diagnosis not present

## 2021-04-20 DIAGNOSIS — I5033 Acute on chronic diastolic (congestive) heart failure: Secondary | ICD-10-CM | POA: Diagnosis not present

## 2021-04-20 DIAGNOSIS — R0989 Other specified symptoms and signs involving the circulatory and respiratory systems: Secondary | ICD-10-CM | POA: Diagnosis not present

## 2021-04-20 DIAGNOSIS — I110 Hypertensive heart disease with heart failure: Secondary | ICD-10-CM | POA: Diagnosis not present

## 2021-04-20 LAB — TROPONIN I (HIGH SENSITIVITY)
Troponin I (High Sensitivity): 7 ng/L (ref <18)
Troponin I (High Sensitivity): 8 ng/L (ref <18)

## 2021-04-20 LAB — URINALYSIS, ROUTINE W REFLEX MICROSCOPIC
Bacteria, UA: NONE SEEN
Glucose, UA: NEGATIVE mg/dL
Hgb urine dipstick: NEGATIVE
Ketones, ur: 5 mg/dL — AB
Leukocytes,Ua: NEGATIVE
Nitrite: NEGATIVE
Protein, ur: 30 mg/dL — AB
Specific Gravity, Urine: 1.028 (ref 1.005–1.030)
pH: 5 (ref 5.0–8.0)

## 2021-04-20 LAB — CBC
HCT: 47.2 % (ref 39.0–52.0)
Hemoglobin: 15.3 g/dL (ref 13.0–17.0)
MCH: 30.4 pg (ref 26.0–34.0)
MCHC: 32.4 g/dL (ref 30.0–36.0)
MCV: 93.8 fL (ref 80.0–100.0)
Platelets: 144 10*3/uL — ABNORMAL LOW (ref 150–400)
RBC: 5.03 MIL/uL (ref 4.22–5.81)
RDW: 14.3 % (ref 11.5–15.5)
WBC: 9.2 10*3/uL (ref 4.0–10.5)
nRBC: 0 % (ref 0.0–0.2)

## 2021-04-20 LAB — RESP PANEL BY RT-PCR (FLU A&B, COVID) ARPGX2
Influenza A by PCR: NEGATIVE
Influenza B by PCR: NEGATIVE
SARS Coronavirus 2 by RT PCR: NEGATIVE

## 2021-04-20 LAB — BASIC METABOLIC PANEL
Anion gap: 8 (ref 5–15)
BUN: 17 mg/dL (ref 8–23)
CO2: 27 mmol/L (ref 22–32)
Calcium: 9.2 mg/dL (ref 8.9–10.3)
Chloride: 104 mmol/L (ref 98–111)
Creatinine, Ser: 0.87 mg/dL (ref 0.61–1.24)
GFR, Estimated: >60 mL/min (ref >60)
Glucose, Bld: 140 mg/dL — ABNORMAL HIGH (ref 70–99)
Potassium: 3.9 mmol/L (ref 3.5–5.1)
Sodium: 139 mmol/L (ref 135–145)

## 2021-04-20 NOTE — ED Provider Notes (Signed)
°  Emergency Medicine Provider Triage Evaluation Note  Andre [name] , a 85 y.o.male,  was evaluated in triage.  Pt complains of shortness of breath.  Patient presents to the ED via EMS from his primary care provider due to shortness of breath and increased weakness/fatigue since being discharged on 12/28.  Per EMS, his heart rate was between 40 and 98.  Denies chest pain, abdominal pain, urinary symptoms, back pain, or headaches.    Review of Systems  Positive: Weakness, shortness of breath Negative: Denies fever, chest pain, vomiting  Physical Exam  There were no vitals filed for this visit. Gen:   Awake, no distress   Resp:  Normal effort  MSK:   Moves extremities without difficulty  Other:    Medical Decision Making  Given the patient's initial medical screening exam, the following diagnostic evaluation has been ordered. The patient will be placed in the appropriate treatment space, once one is available, to complete the evaluation and treatment. I have discussed the plan of care with the patient and I have advised the patient that an ED physician or mid-level practitioner will reevaluate their condition after the test results have been received, as the results may give them additional insight into the type of treatment they may need.    Diagnostics: Labs, EKG, CXR  Treatments: none immediately   Teodoro Spray, [place] 04/20/21 1618    Nance Pear, MD 04/20/21 1743

## 2021-04-20 NOTE — Progress Notes (Signed)
Acute Office Visit  Subjective:    Patient ID: Andre [name], male    DOB: 1936/02/21, 86 y.o.   MRN: [id number]  Chief Complaint  Patient presents with   Hospitalization Follow-up    dizziness    HPI 86 year old male, with a history of CHF, Atrial Fibrillation, HLD, and obesity  patient is in today accompanied by his wife with worsening fatigue, sleeping more, and lightheadedness. Symptoms improve with lying down and worsen with sitting up and walking. Denies any chest pain. Patient was admitted to the hospital on 04/11/21 with symptoms of SOB and Hypoxia and a fall at home. Wife reports he became dizziness and slid softly down some steps. CT scan of the head and neck at the ED was negative for acute injury.  Was also found to have a troponin of 50 on 04/11/21 and 26 on 04/12/21. Patient was subsequently admitted, diuresed, and discharged in stable condition. Wife believes his condition is worsening and he is sleeping too much due to lack of energy.   Past Medical History:  Diagnosis Date   Atherosclerosis of abdominal aorta ([place])    Basal cell carcinoma 03/04/2008   Right nose supratip.    CAD (coronary artery disease)    Cervical spondylosis 10/01/2013   Chronic diastolic CHF (congestive heart failure) ([place])    a. 07/2016 Echo: >55%; b. 10/2016 Echo: EF 55-60%, Gr1 DD, Ao sclerosis w/o stenosis, sev dil LA; c. 08/2017 Echo: EF 60-65%, no rwma, Gr2 DD, mild AS, sev dil LA/RA.   Coronary artery disease    a. 1998 s/p mini-cabg @ [facility] - LIMA->LAD;  b. 07/2016 St Echo: Inadequate HR w/ HTN response;  c.  08/2016 MV: EF 67%, no ischemia; d. 10/2016 NSTEMI/Cath: RCA 95p (4.0x26 Onyx DES), LIMA->LAD nl; e. 09/2017 Cath: LM 40/30, LAD 100ost, RI 80, LCX nl, OM2/3 nl, RCA patent stent, 79m, LIMA->LAD nl-->Med Rx.   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 s/p DCCV-->maintaining sinus on amiodarone;  b. CHA2DS2VASc = 5-->eliquis.   Parkinson's disease ([place])    tremors   Pleural effusion, right    a. 09/2017 s/p thoracentesis.   PNA (pneumonia) 08/26/2017   Pulmonary embolism ([place]) 2011   Pulmonary fibrosis ([place])    Secondary erythrocytosis 01/28/2015   Sleep apnea    wears CPAP   Squamous cell carcinoma of skin 03/19/2015   Right lateral crown. KA-like pattern   Thrombocytopenia ([place])     Past Surgical History:  Procedure Laterality Date   BACK SURGERY  1960   CARDIAC CATHETERIZATION     CATARACT EXTRACTION Right    CATARACT EXTRACTION Bilateral    CHOLECYSTECTOMY  2010   COLONOSCOPY WITH PROPOFOL N/A 06/07/2018   Procedure: COLONOSCOPY WITH PROPOFOL;  Surgeon: Lollie Sails, MD;  Location: [facility] ENDOSCOPY;  Service: Endoscopy;  Laterality: N/A;   CORONARY ARTERY BYPASS GRAFT  01/07/1997   CORONARY STENT INTERVENTION N/A 10/31/2016   Procedure: Coronary Stent Intervention;  Surgeon: Wellington Hampshire, MD;  Location: [place] CV LAB;  Service: Cardiovascular;  Laterality: N/A;   ELECTROPHYSIOLOGIC  STUDY N/A 07/14/2015   Procedure: CARDIOVERSION;  Surgeon: Yolonda Kida, MD;  Location: [facility] ORS;  Service: Cardiovascular;  Laterality: N/A;   ELECTROPHYSIOLOGIC STUDY N/A 10/12/2015   Procedure: CARDIOVERSION;  Surgeon: Minna Merritts, MD;  Location: [facility] ORS;  Service: Cardiovascular;  Laterality: N/A;   LEFT HEART CATH AND CORONARY ANGIOGRAPHY N/A 10/31/2016   Procedure: Left Heart Cath and Coronary Angiography;  Surgeon: Wellington Hampshire, MD;  Location: [place] CV LAB;  Service: Cardiovascular;  Laterality: N/A;   OTHER SURGICAL HISTORY  1998   Bypass   RIGHT/LEFT HEART CATH AND CORONARY  ANGIOGRAPHY N/A 09/18/2017   Procedure: RIGHT/LEFT HEART CATH AND CORONARY ANGIOGRAPHY;  Surgeon: Wellington Hampshire, MD;  Location: [place] CV LAB;  Service: Cardiovascular;  Laterality: N/A;    Family History  Problem Relation Age of Onset   Alcohol abuse Father    Parkinson's disease Neg Hx     Social History   Socioeconomic History   Marital status: Married    Spouse name: [name]   Number of children: 1   Years of education: 12   Highest education level: Not on file  Occupational History   Occupation: Retired    Comment: Designer, television/film set  Tobacco Use   Smoking status: Former    Packs/day: 1.00    Years: 10.00    Pack years: 10.00    Types: Cigarettes, Pipe, Cigars    Quit date: 04/18/1972    Years since quitting: 49.0   Smokeless tobacco: Former    Types: Chew    Quit date: 04/18/1972  Vaping Use   Vaping Use: Never used  Substance and Sexual Activity   Alcohol use: Yes    Alcohol/week: 4.0 standard drinks    Types: 2 Cans of beer, 2 Shots of liquor per week    Comment: per 2 weeks    Drug use: No   Sexual activity: Yes  Other Topics Concern   Not on file  Social History Narrative   Lives w/ wife   Caffeine use: none   Right-handed   Social Determinants of Health   Financial Resource Strain: Not on file  Food Insecurity: Not on file  Transportation Needs: Not on file  Physical Activity: Not on file  Stress: Not on file  Social Connections: Not on file  Intimate Partner Violence: Not on file    Outpatient Medications Prior to Visit  Medication Sig Dispense Refill   albuterol (VENTOLIN HFA) 108 (90 Base) MCG/ACT inhaler Inhale 2 puffs into the lungs every 6 (six) hours as needed for wheezing or shortness of breath. 8 g 6   buPROPion (WELLBUTRIN XL) 300 MG 24 hr tablet TAKE ONE TABLET BY MOUTH EVERY DAY (Patient taking differently: Take 300 mg by mouth daily.) 90 tablet 1   carbidopa-levodopa (SINEMET IR) 25-250 MG tablet Take 1 tablet by mouth 4  (four) times daily. 360 tablet 3   carvedilol (COREG) 3.125 MG tablet TAKE TWO TABLETS TWICE A DAY WITH MEALS (Patient taking differently: Take 6.25 mg by mouth 2 (two) times daily with a meal.) 120 tablet 5   ELIQUIS 5 MG TABS tablet TAKE ONE TABLET BY MOUTH TWICE DAILY 180 tablet 1   entacapone (COMTAN) 200 MG tablet TAKE 1 TABLET BY MOUTH 3 TIMES DAILY (Patient taking differently: Take 200 mg by mouth 3 (three) times daily.) 270 tablet 3   esomeprazole (NEXIUM) 40 MG capsule TAKE 1 CAPSULE BY MOUTH ONCE DAILY (Patient taking differently: Take 40 mg by  mouth daily.) 30 capsule 2   feeding supplement (ENSURE ENLIVE / ENSURE PLUS) LIQD Take 237 mLs by mouth 3 (three) times daily between meals. 237 mL 12   fluticasone (FLONASE) 50 MCG/ACT nasal spray USE 2 PUFFS IN EACH NOSTRIL DAILY (Patient taking differently: Place 2 sprays into both nostrils daily.) 16 g 3   furosemide (LASIX) 40 MG tablet Take 1 tablet (40 mg total) by mouth daily. 30 tablet 0   GEMTESA 75 MG TABS TAKE 1 TABLET BY MOUTH DAILY (Patient taking differently: Take 75 mg by mouth daily.) 30 tablet 6   hydrocortisone 2.5 % lotion For seborrheic dermatitis of the face apply to aa's QAM on Tuesday, Thursday, and Saturday. 59 mL 0   ketoconazole (NIZORAL) 2 % cream Apply a thin coat to face QAM on Monday, Wednesday, and Friday. 60 g 3   ketoconazole (NIZORAL) 2 % shampoo Shampoo into the face and scalp let sit 5 minutes then wash off. Use 3d/wk. 120 mL 3   lactulose (CHRONULAC) 10 GM/15ML solution TAKE 30 MLS BY MOUTH TWICE DAILY AS NEEDED FOR MODERATE CONSTIPATION (Patient taking differently: Take 20 g by mouth 2 (two) times daily as needed for moderate constipation.) 236 mL 0   levothyroxine (SYNTHROID) 50 MCG tablet TAKE ONE TABLET ON AN EMPTY STOMACH WITHA GLASS OF WATER AT LEAST 30 TO 60 MINUTES BEFORE BREAKFAST (Patient taking differently: Take 50 mcg by mouth daily before breakfast.) 90 tablet 1   losartan (COZAAR) 100 MG tablet TAKE  1 TABLET BY MOUTH DAILY 90 tablet 1   potassium chloride SA (KLOR-CON) 10 MEQ tablet Take 1 tablet (10 mEq total) by mouth daily. 90 tablet 1   QUEtiapine (SEROQUEL) 25 MG tablet Take 1 tablet (25 mg total) by mouth at bedtime. 1 tablet at night 90 tablet 1   Respiratory Therapy Supplies (FLUTTER) DEVI 1 Device by Does not apply route daily. 1 each 0   rosuvastatin (CRESTOR) 10 MG tablet TAKE 1 TABLET BY MOUTH DAILY 90 tablet 0   sertraline (ZOLOFT) 50 MG tablet TAKE 1 TABLET BY MOUTH DAILY 30 tablet 3   silodosin (RAPAFLO) 8 MG CAPS capsule TAKE 1 CAPSULE BY MOUTH ONCE DAILY WITH BREAKFAST 30 capsule 3   No facility-administered medications prior to visit.    Allergies  Allergen Reactions   Pravastatin Other (See Comments)   Prednisone Other (See Comments)    Pt states that med makes him hyper Pt states that med makes him hyper    Review of Systems  Constitutional:  Positive for activity change and fatigue.  Respiratory: Negative.  Negative for wheezing.   Cardiovascular:  Negative for chest pain.  Gastrointestinal: Negative.   Endocrine: Negative.   Genitourinary: Negative.   Musculoskeletal: Negative.   Skin: Negative.   Neurological:  Positive for weakness and light-headedness.  Psychiatric/Behavioral: Negative.        Objective:    Physical Exam Vitals reviewed.  Constitutional:      Appearance: He is obese.     Comments: Appears fatigued  HENT:     Head: Normocephalic.     Right Ear: Tympanic membrane and ear canal normal.     Left Ear: Tympanic membrane and ear canal normal.  Cardiovascular:     Rate and Rhythm: Bradycardia present. Rhythm irregular.     Comments: Heart rate between 36 and 74. Patient symptomatic when heart rate is in the 30s.  Pulmonary:     Effort: Pulmonary effort is normal.  Breath sounds: Normal breath sounds. No wheezing.  Abdominal:     General: Bowel sounds are normal.     Palpations: Abdomen is soft.  Musculoskeletal:         General: Normal range of motion.  Skin:    General: Skin is warm and dry.  Neurological:     General: No focal deficit present.     Mental Status: He is alert and oriented to person, place, and time.     Motor: Weakness present.  Psychiatric:        Mood and Affect: Mood normal.        Behavior: Behavior normal.    BP 105/60 (BP Location: Left Arm, Patient Position: Sitting, Cuff Size: Normal)    Pulse (!) 36    Temp 97.9 F (36.6 C) (Oral)    Ht 5\' 7"  (1.702 m)    Wt 231 lb 9.6 oz (105.1 kg)    SpO2 96%    BMI 36.27 kg/m  Wt Readings from Last 3 Encounters:  04/20/21 231 lb 9.6 oz (105.1 kg)  04/14/21 233 lb 14.5 oz (106.1 kg)  04/13/21 233 lb 14.5 oz (106.1 kg)    Health Maintenance Due  Topic Date Due   Pneumonia Vaccine 26+ Years old (2 - PPSV23 if available, else PCV20) 05/10/2017   COVID-19 Vaccine (4 - Booster for [place] series) 04/04/2020    There are no preventive care reminders to display for this patient.   Lab Results  Component Value Date   TSH 2.77 06/02/2020   Lab Results  Component Value Date   WBC 8.6 04/12/2021   HGB 14.1 04/12/2021   HCT 43.8 04/12/2021   MCV 93.8 04/12/2021   PLT 116 (L) 04/12/2021   Lab Results  Component Value Date   NA 138 04/12/2021   K 4.0 04/12/2021   CO2 30 04/12/2021   GLUCOSE 122 (H) 04/12/2021   BUN 18 04/12/2021   CREATININE 0.97 04/12/2021   BILITOT 2.3 (H) 04/11/2021   ALKPHOS 85 04/11/2021   AST 23 04/11/2021   ALT 12 04/11/2021   PROT 6.6 04/11/2021   ALBUMIN 3.4 (L) 04/11/2021   CALCIUM 8.9 04/12/2021   ANIONGAP 8 04/12/2021   GFR 85.05 06/02/2020   Lab Results  Component Value Date   CHOL 146 08/27/2017   Lab Results  Component Value Date   HDL 43 08/27/2017   Lab Results  Component Value Date   LDLCALC 76 08/27/2017   Lab Results  Component Value Date   TRIG 134 08/27/2017   Lab Results  Component Value Date   CHOLHDL 3.4 08/27/2017   Lab Results  Component Value Date   HGBA1C 5.7  04/21/2020       Assessment & Plan:   Problem List Items Addressed This Visit     PAF (paroxysmal atrial fibrillation) (HCC) (Chronic)   Lightheadedness   Other Visit Diagnoses     Hypotension, unspecified hypotension type    -  Primary   Relevant Orders   EKG 12-Lead (Completed)   Bradycardia          Phoned Dr. Randel Pigg to consult regarding this patient who agrees patient should be transported to the ED. EMS notified.  Patient transported by EMS to the ED  No orders of the defined types were placed in this encounter.    Kennyth Arnold, FNP

## 2021-04-20 NOTE — ED Triage Notes (Addendum)
Pt comes into the ED via EMS from PCP with c/o SOB and increased generalized weakness/fatigue since being discharged 12/28.  136/68 HR 40-98  97%RA #[id number]

## 2021-04-21 ENCOUNTER — Encounter: Payer: Self-pay | Admitting: Internal Medicine

## 2021-04-21 ENCOUNTER — Telehealth: Payer: Self-pay | Admitting: Pulmonary Disease

## 2021-04-21 ENCOUNTER — Emergency Department
Admission: EM | Admit: 2021-04-21 | Discharge: 2021-04-21 | Disposition: A | Discharge status: Home / Self Care | Payer: HMO | Attending: Emergency Medicine | Admitting: Emergency Medicine

## 2021-04-21 ENCOUNTER — Telehealth: Payer: Self-pay | Admitting: Cardiovascular Disease

## 2021-04-21 DIAGNOSIS — I5033 Acute on chronic diastolic (congestive) heart failure: Secondary | ICD-10-CM

## 2021-04-21 DIAGNOSIS — R001 Bradycardia, unspecified: Secondary | ICD-10-CM | POA: Diagnosis not present

## 2021-04-21 DIAGNOSIS — I509 Heart failure, unspecified: Secondary | ICD-10-CM

## 2021-04-21 DIAGNOSIS — G4733 Obstructive sleep apnea (adult) (pediatric): Secondary | ICD-10-CM

## 2021-04-21 LAB — BRAIN NATRIURETIC PEPTIDE: B Natriuretic Peptide: 433.9 pg/mL — ABNORMAL HIGH (ref 0.0–100.0)

## 2021-04-21 MED ORDER — CARVEDILOL 6.25 MG PO TABS
6.2500 mg | ORAL_TABLET | Freq: Two times a day (BID) | ORAL | Status: DC
  Administered 2021-04-21: 6.25 mg via ORAL
  Filled 2021-04-21: qty 1

## 2021-04-21 MED ORDER — FUROSEMIDE 10 MG/ML IJ SOLN
40.0000 mg | Freq: Once | INTRAMUSCULAR | Status: AC
  Administered 2021-04-21: 40 mg via INTRAVENOUS
  Filled 2021-04-21: qty 4

## 2021-04-21 NOTE — ED Provider Notes (Signed)
[facility] Provider Note    Event Date/Time   First MD Initiated Contact with Patient 04/21/21 [phone]     (approximate)   History   Shortness of Breath and Weakness   HPI  Andre [name] is a 86 y.o. male who presents with complaints of a low heart rate and mild shortness of breath with fatigue.  Patient reports he went to see his PCP yesterday for routine visit, they noted that his heart rate was dipping into the 30s and referred him to the emergency department.  He states that he feels somewhat fatigued and "wiped out ".  Mild shortness of breath, does have a history of CHF, paroxysmal atrial fibrillation, is on Eliquis.  Review of medical records demonstrates the patient was discharged from our hospital after being treated for CHF exacerbation on April 13, 2021     Physical Exam   Triage Vital Signs: ED Triage Vitals  Enc Vitals Group     BP 04/20/21 1859 (!) 148/55     Pulse Rate 04/20/21 1859 (!) 48     Resp 04/20/21 1859 18     Temp 04/20/21 1859 97.6 F (36.4 C)     Temp Source 04/20/21 1859 Oral     SpO2 04/20/21 1859 93 %     Weight --      Height --      Head Circumference --      Peak Flow --      Pain Score 04/20/21 1618 0     Pain Loc --      Pain Edu? --      Excl. in [place]? --     Most recent vital signs: Vitals:   04/20/21 2143 04/21/21 0513  BP: (!) 155/66 (!) 160/64  Pulse: (!) 36 (!) 42  Resp: 18 20  Temp: 97.7 F (36.5 C)   SpO2: 96% 95%     General: Awake, no distress.  CV:  Good peripheral perfusion.  Irregular rhythm, heart rate currently in the 40s Resp:  Normal effort.  Mild bibasilar Rales Abd:  No distention.  Other:  1+ edema bilaterally   ED Results / Procedures / Treatments   Labs (all labs ordered are listed, but only abnormal results are displayed) Labs Reviewed  BASIC METABOLIC PANEL - Abnormal; Notable for the following components:      Result Value   Glucose, Bld 140 (*)    All other  components within normal limits  CBC - Abnormal; Notable for the following components:   Platelets 144 (*)    All other components within normal limits  URINALYSIS, ROUTINE W REFLEX MICROSCOPIC - Abnormal; Notable for the following components:   Color, Urine AMBER (*)    APPearance CLEAR (*)    Bilirubin Urine SMALL (*)    Ketones, ur 5 (*)    Protein, ur 30 (*)    All other components within normal limits  BRAIN NATRIURETIC PEPTIDE - Abnormal; Notable for the following components:   B Natriuretic Peptide 433.9 (*)    All other components within normal limits  RESP PANEL BY RT-PCR (FLU A&B, COVID) ARPGX2  CBG MONITORING, ED  TROPONIN I (HIGH SENSITIVITY)  TROPONIN I (HIGH SENSITIVITY)     EKG  ED ECG REPORT I, Lavonia Drafts, the attending physician, personally viewed and interpreted this ECG.  Date: 04/21/2021  Rhythm: normal sinus rhythm QRS Axis: normal Intervals: Abnormal ST/T Wave abnormalities: Nonspecific changes Narrative Interpretation: Frequent PVCs  RADIOLOGY Chest x-ray reviewed by me, bilateral pleural effusions    PROCEDURES:  Critical Care performed:   .1-3 Lead EKG Interpretation Performed by: Lavonia Drafts, MD Authorized by: Lavonia Drafts, MD     Interpretation: abnormal     ECG rate assessment: bradycardic     Rhythm: sinus bradycardia     Ectopy: PVCs     MEDICATIONS ORDERED IN ED: Medications  furosemide (LASIX) injection 40 mg (has no administration in time range)     IMPRESSION / MDM / ASSESSMENT AND PLAN / ED COURSE  I reviewed the triage vital signs and the nursing notes.   Differential diagnosis includes, but is not limited to, renal failure, medication side effect, CHF exacerbation  Patient presents with fatigue, mild shortness of breath, bradycardia.  Heart rate is noted to be in the 30s and 40s in the emergency department although he has a normal blood pressure which is reassuring.  No syncopal episodes.  EKG  demonstrates frequent PVCs  Lab work reviewed, troponins normal, BNP elevated at 433.  Otherwise CBC and CMP are reassuring, normal creatinine  Chest x-ray with mild congestion, bilateral pleural effusions  Consulted and discussed with Dr. Saunders Revel of cardiology who recommends admission, will give IV Lasix in the emergency department  Have consulted and discussed with hospitalist for admission       FINAL CLINICAL IMPRESSION(S) / ED DIAGNOSES   Final diagnoses:  Acute on chronic congestive heart failure, unspecified heart failure type ([place])  Bradycardia     Rx / DC Orders   ED Discharge Orders     None        Note:  This document was prepared using Dragon voice recognition software and may include unintentional dictation errors.   Lavonia Drafts, MD 04/21/21 [phone]

## 2021-04-21 NOTE — Telephone Encounter (Signed)
DPR on file. Returned the patients wife call. Lmom. Pt seen in the ED today. Given 40 mg od IV lasix. Cardiology was consulted.   Lavonia Drafts, MD 04/21/2021 14:53                    Patient was subsequently seen by Dr. Saunders Revel of cardiology in the emergency department and he recommended discharge with outpatient follow-up in his office.  The patient agreed with this plan       Advised the pt wife to review the patients d/c summary for instructions regarding any possible medication changes. She can contact the office if any additional questions.

## 2021-04-21 NOTE — ED Provider Notes (Signed)
Patient was subsequently seen by Dr. Saunders Revel of cardiology in the emergency department and he recommended discharge with outpatient follow-up in his office.  The patient agreed with this plan   Lavonia Drafts, MD 04/21/21 1453

## 2021-04-21 NOTE — Consult Note (Signed)
Cardiology Consultation:   Patient ID: CLEMMIE MARXEN MRN: [id number]; DOB: 08-04-1935  Admit date: 04/21/2021 Date of Consult: 04/21/2021  PCP:  [name], MD   [facility] Providers Cardiologist:  [name], MD   {  Patient Profile:   DAVIE CLAUD is a 86 y.o. male with a hx of CAD s/p CABGx1 with LIMA to LAD in 1998, Afib, HTN, HLD, HFpEF, h/o PE, Parkinson's who is being seen 04/21/2021 for the evaluation of bradycardia at the request of Dr. Corky Downs.  History of Present Illness:   Mr. Castilla is followed by Dr. Fletcher Anon. The patient was hospitalized in July 2018 with small NSTEMI. Echo showed normal LV function with severely dilated left atrium. Cardiac cath showed occluded native LAD with patent LIMA, severe proximal RCA stenosis with heavy thrombus and moderate distal left main stenosis with significant ostial stenosis in the ramus branch. She was treated with angioplasty and DES placement to the RCA which was complicated by embolization into a large RV branch at the lesion site. This was treated with balloon angioplasty. EF was 50-55% with mildly elevated LVEDP   R/L heart cath was done in June 2019 for exertional dyspnea. This showed patent LIMA to LAD and patent RCA stent. There was significant ostial disease in ramus branch as well as first diag. Both were unchanged from previous cardiac catheterization. RHC showed mildly elevated filling pressures, minimal pulmonary HTHN and normal cardiac output. It was felt symptoms were mostly non-cardiac. HE is being treated by pulmonary for pulmonary fibrosis.   Echo 02/24/21 showed LVEF 55%, no WMA, severely dilated LA, mild AI, mild to mod AoV sclerosis.  Last seen 01/26/21 and reported fatigue and sleepiness. No anginal symptoms. Afib rates were controlled on Eliquis. Given SOB and fatigue an echo was ordered. It was suspected his fatigue was related to psychotropic and Parkinson's medications.   The patient was at his PCPs office  for follow up for fall. Recently had 2 mechanical falls, one tripped on the concrete outside and one he fell at night when he got up to use the bathroom. He had head CT and MRI ordered, on Eliquis for Afib. He was at follow-up and EKG showed SR with frequent PVCs, heart noted ot be low. He was sent to the ER.   In the ER tele showed SR with frequent PVCs. BP wnl. Labs showed K 3.9, sodium 139, Scr 0.87, BUN 17. BNP 433. HS trop 7>8, WBC 9.2, Hgb 15.3. Negative respiratory panel. CXR with mild congestion and b/l pleural effusions. Cardiology was consulted and patient was given IV lasix 40mg  in the ER and admitted.    Past Medical History:  Diagnosis Date   Atherosclerosis of abdominal aorta ([place])    Basal cell carcinoma 03/04/2008   Right nose supratip.    CAD (coronary artery disease)    CABG 1998   Cervical spondylosis 10/01/2013   Chronic diastolic CHF (congestive heart failure) ([place])    a. 07/2016 Echo: >55%; b. 10/2016 Echo: EF 55-60%, Gr1 DD, Ao sclerosis w/o stenosis, sev dil LA; c. 08/2017 Echo: EF 60-65%, no rwma, Gr2 DD, mild AS, sev dil LA/RA.   Coronary artery disease    a. 1998 s/p mini-cabg @ [facility] - LIMA->LAD;  b. 07/2016 St Echo: Inadequate HR w/ HTN response;  c.  08/2016 MV: EF 67%, no ischemia; d. 10/2016 NSTEMI/Cath: RCA 95p (4.0x26 Onyx DES), LIMA->LAD nl; e. 09/2017 Cath: LM 40/30, LAD 100ost, RI 80, LCX nl, OM2/3 nl, RCA  patent stent, 54m, LIMA->LAD nl-->Med Rx.   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 s/p DCCV-->maintaining sinus on amiodarone;  b. CHA2DS2VASc = 5-->eliquis.   Parkinson's disease ([place])     tremors   Pleural effusion, right    a. 09/2017 s/p thoracentesis.   PNA (pneumonia) 08/26/2017   Pulmonary embolism ([place]) 2011   Pulmonary fibrosis ([place])    Secondary erythrocytosis 01/28/2015   Sleep apnea    wears CPAP   Squamous cell carcinoma of skin 03/19/2015   Right lateral crown. KA-like pattern   Thrombocytopenia ([place])     Past Surgical History:  Procedure Laterality Date   BACK SURGERY  1960   CARDIAC CATHETERIZATION     CATARACT EXTRACTION Right    CATARACT EXTRACTION Bilateral    CHOLECYSTECTOMY  2010   COLONOSCOPY WITH PROPOFOL N/A 06/07/2018   Procedure: COLONOSCOPY WITH PROPOFOL;  Surgeon: Lollie Sails, MD;  [facility];  Service: Endoscopy;  Laterality: N/A;   CORONARY ARTERY BYPASS GRAFT  01/07/1997   CORONARY STENT INTERVENTION N/A 10/31/2016   Procedure: Coronary Stent Intervention;  Surgeon: Wellington Hampshire, MD;  Location: [place] CV LAB;  Service: Cardiovascular;  Laterality: N/A;   ELECTROPHYSIOLOGIC STUDY N/A 07/14/2015   Procedure: CARDIOVERSION;  Surgeon: Yolonda Kida, MD;  Location: [facility] ORS;  Service: Cardiovascular;  Laterality: N/A;   ELECTROPHYSIOLOGIC STUDY N/A 10/12/2015   Procedure: CARDIOVERSION;  Surgeon: Minna Merritts, MD;  Location: [facility] ORS;  Service: Cardiovascular;  Laterality: N/A;   LEFT HEART CATH AND CORONARY ANGIOGRAPHY N/A 10/31/2016   Procedure: Left Heart Cath and Coronary Angiography;  Surgeon: Wellington Hampshire, MD;  Location: [place] [facility];  Service: Cardiovascular;  Laterality: N/A;   OTHER SURGICAL HISTORY  1998   Bypass   RIGHT/LEFT HEART CATH AND CORONARY ANGIOGRAPHY N/A 09/18/2017   Procedure: RIGHT/LEFT HEART CATH AND CORONARY ANGIOGRAPHY;  Surgeon: Wellington Hampshire, MD;  Location: [place] [facility];  Service: Cardiovascular;  Laterality: N/A;     Home Medications:  Prior to Admission medications   Medication Sig Start Date End Date Taking? Authorizing Provider  albuterol  (VENTOLIN HFA) 108 (90 Base) MCG/ACT inhaler Inhale 2 puffs into the lungs every 6 (six) hours as needed for wheezing or shortness of breath. 04/08/21   Mannam, Hart Robinsons, MD  buPROPion (WELLBUTRIN XL) 300 MG 24 hr tablet TAKE ONE TABLET BY MOUTH EVERY DAY Patient taking differently: Take 300 mg by mouth daily. 08/20/20   [name], MD  carbidopa-levodopa (SINEMET IR) 25-250 MG tablet Take 1 tablet by mouth 4 (four) times daily. 03/04/21   Star Age, MD  carvedilol (COREG) 3.125 MG tablet TAKE TWO TABLETS TWICE A DAY WITH MEALS Patient taking differently: Take 6.25 mg by mouth 2 (two) times daily with a meal. 12/04/20   Arida, Mertie Clause, MD  ELIQUIS 5 MG TABS tablet TAKE ONE TABLET BY MOUTH TWICE DAILY 04/06/21   Wellington Hampshire, MD  entacapone (COMTAN) 200 MG tablet TAKE 1 TABLET BY MOUTH 3 TIMES DAILY Patient taking differently: Take 200 mg by mouth 3 (  three) times daily. 07/16/20   Kathrynn Ducking, MD  esomeprazole (NEXIUM) 40 MG capsule TAKE 1 CAPSULE BY MOUTH ONCE DAILY Patient taking differently: Take 40 mg by mouth daily. 03/18/21   [name], MD  feeding supplement (ENSURE ENLIVE / ENSURE PLUS) LIQD Take 237 mLs by mouth 3 (three) times daily between meals. 05/07/20   Danford, Suann Larry, MD  fluticasone (FLONASE) 50 MCG/ACT nasal spray USE 2 PUFFS IN EACH NOSTRIL DAILY Patient taking differently: Place 2 sprays into both nostrils daily. 03/09/21   [name], MD  furosemide (LASIX) 40 MG tablet Take 1 tablet (40 mg total) by mouth daily. 04/13/21 05/13/21  Sreenath, Trula Slade, MD  GEMTESA 75 MG TABS TAKE 1 TABLET BY MOUTH DAILY Patient taking differently: Take 75 mg by mouth daily. 02/22/21   Abbie Sons, MD  hydrocortisone 2.5 % lotion For seborrheic dermatitis of the face apply to aa's QAM on Tuesday, Thursday, and Saturday. 11/05/20   Ralene Bathe, MD  ketoconazole (NIZORAL) 2 % cream Apply a thin coat to face QAM on Monday, Wednesday, and Friday.  11/05/20   Ralene Bathe, MD  ketoconazole (NIZORAL) 2 % shampoo Shampoo into the face and scalp let sit 5 minutes then wash off. Use 3d/wk. 11/05/20   Ralene Bathe, MD  lactulose (CHRONULAC) 10 GM/15ML solution TAKE 30 MLS BY MOUTH TWICE DAILY AS NEEDED FOR MODERATE CONSTIPATION Patient taking differently: Take 20 g by mouth 2 (two) times daily as needed for moderate constipation. 07/27/20   Crecencio Mc, MD  levothyroxine (SYNTHROID) 50 MCG tablet TAKE ONE TABLET ON AN EMPTY STOMACH WITHA GLASS OF WATER AT LEAST 30 TO 60 MINUTES BEFORE BREAKFAST Patient taking differently: Take 50 mcg by mouth daily before breakfast. 08/20/20   [name], MD  losartan (COZAAR) 100 MG tablet TAKE 1 TABLET BY MOUTH DAILY 11/13/20   [name], MD  potassium chloride SA (KLOR-CON) 10 MEQ tablet Take 1 tablet (10 mEq total) by mouth daily. 12/12/19   Wellington Hampshire, MD  QUEtiapine (SEROQUEL) 25 MG tablet Take 1 tablet (25 mg total) by mouth at bedtime. 1 tablet at night 03/16/21   Star Age, MD  Respiratory Therapy Supplies (FLUTTER) DEVI 1 Device by Does not apply route daily. 05/29/19   Martyn Ehrich, NP  rosuvastatin (CRESTOR) 10 MG tablet TAKE 1 TABLET BY MOUTH DAILY 03/09/21   Wellington Hampshire, MD  sertraline (ZOLOFT) 50 MG tablet TAKE 1 TABLET BY MOUTH DAILY 04/07/21   [name], MD  silodosin (RAPAFLO) 8 MG CAPS capsule TAKE 1 CAPSULE BY MOUTH ONCE DAILY WITH BREAKFAST 03/26/21   Stoioff, Ronda Fairly, MD    Inpatient Medications: Scheduled Meds:  carvedilol  6.25 mg Oral BID WC   furosemide  40 mg Intravenous Once   Continuous Infusions:  PRN Meds:   Allergies:    Allergies  Allergen Reactions   Pravastatin Other (See Comments)   Prednisone Other (See Comments)    Pt states that med makes him hyper Pt states that med makes him hyper    Social History:   Social History   Socioeconomic History   Marital status: Married    Spouse name: [name]   Number  of children: 1   Years of education: 12   Highest education level: Not on file  Occupational History   Occupation: Retired    Comment: Designer, television/film set  Tobacco Use   Smoking status: Former  Packs/day: 1.00    Years: 10.00    Pack years: 10.00    Types: Cigarettes, Pipe, Cigars    Quit date: 04/18/1972    Years since quitting: 49.0   Smokeless tobacco: Former    Types: Chew    Quit date: 04/18/1972  Vaping Use   Vaping Use: Never used  Substance and Sexual Activity   Alcohol use: Yes    Alcohol/week: 4.0 standard drinks    Types: 2 Cans of beer, 2 Shots of liquor per week    Comment: per 2 weeks    Drug use: No   Sexual activity: Yes  Other Topics Concern   Not on file  Social History Narrative   Lives w/ wife   Caffeine use: none   Right-handed   Social Determinants of Health   Financial Resource Strain: Not on file  Food Insecurity: Not on file  Transportation Needs: Not on file  Physical Activity: Not on file  Stress: Not on file  Social Connections: Not on file  Intimate Partner Violence: Not on file    Family History:    Family History  Problem Relation Age of Onset   Alcohol abuse Father    Parkinson's disease Neg Hx      ROS:  Please see the history of present illness.   All other ROS reviewed and negative.     Physical Exam/Data:   Vitals:   04/21/21 0730 04/21/21 0731 04/21/21 0745 04/21/21 0800  BP: (!) 166/74 (!) 166/73  (!) 167/125  Pulse: (!) 40 (!) 40 (!) 34 (!) 36  Resp:  20  16  Temp:      TempSrc:      SpO2: 98% 96% 96% 97%   No intake or output data in the 24 hours ending 04/21/21 0850 Last 3 Weights 04/20/2021 04/14/2021 04/13/2021  Weight (lbs) 231 lb 9.6 oz 233 lb 14.5 oz 233 lb 14.5 oz  Weight (kg) 105.053 kg 106.1 kg 106.1 kg     There is no height or weight on file to calculate BMI.  General:  Well nourished, well developed, in no acute distress HEENT: normal Neck: no JVD Vascular: No carotid bruits; Distal pulses 2+  bilaterally Cardiac:  normal S1, S2; RRR; no murmur  Lungs:  clear to auscultation bilaterally, no wheezing, rhonchi or rales  Abd: soft, nontender, no hepatomegaly  Ext: no edema Musculoskeletal:  No deformities, BUE and BLE strength normal and equal Skin: warm and dry  Neuro:  CNs 2-12 intact, no focal abnormalities noted Psych:  Normal affect   EKG:  The EKG was personally reviewed and demonstrates:  NSR, 71bpm, ventricular bigeminy, nonspecific t wave changes Telemetry:  Telemetry was personally reviewed and demonstrates:  NSR, frequent PVCs, ventricular bigeminy  Relevant CV Studies:  Echo 02/24/21  1. Left ventricular ejection fraction, by estimation, is 55%. The left  ventricle has normal function. The left ventricle has no regional wall  motion abnormalities. Left ventricular diastolic parameters are  indeterminate.   2. Right ventricular systolic function is normal. The right ventricular  size is normal.   3. Left atrial size was severely dilated.   4. The mitral valve is normal in structure. No evidence of mitral valve  regurgitation.   5. The aortic valve is calcified. Aortic valve regurgitation is mild.  Mild to moderate aortic valve sclerosis/calcification is present, without  any evidence of aortic stenosis.   6. The inferior vena cava is dilated in size with >50% respiratory  variability,  suggesting right atrial pressure of 8 mmHg.   R/L heart cath 2019 LM lesion is 40% stenosed. Ost LAD lesion is 100% stenosed. Ost Ramus to Ramus lesion is 80% stenosed. Ramus lesion is 80% stenosed. Previously placed Prox RCA to Mid RCA drug eluting stent is widely patent. Balloon angioplasty was performed. Mid RCA lesion is 40% stenosed. LIMA and is normal in caliber. The graft exhibits no disease. Dist LM lesion is 30% stenosed.   1.  Significant underlying three-vessel coronary artery disease with patent LIMA to LAD and patent RCA stent.  Significant ostial disease in the  ramus branch with diffuse disease proximally as well as ostial disease and first diagonal.  These are unchanged from most recent cardiac catheterization. 2.  Right heart catheterization showed mildly elevated filling pressures, minimal pulmonary hypertension and normal cardiac output.   Recommendations: Current findings do not explain the patient's severe exertional dyspnea.  He does have disease in the ramus branch and first diagonal that is unchanged from before.  Any PCI on this will require jailing the left circumflex and thus risks outweigh the benefits.   Continue same cardiac medications. Resume Eliquis tomorrow if no bleeding complications. Recommend further pulmonary evaluation of his dyspnea.  Coronary Diagrams  Diagnostic Dominance: Right    Laboratory Data:  High Sensitivity Troponin:   Recent Labs  Lab 04/11/21 1550 04/11/21 2333 04/12/21 0631 04/20/21 1619 04/20/21 2140  TROPONINIHS 50* 45* 26* 7 8     Chemistry Recent Labs  Lab 04/20/21 1619  NA 139  K 3.9  CL 104  CO2 27  GLUCOSE 140*  BUN 17  CREATININE 0.87  CALCIUM 9.2  GFRNONAA >60  ANIONGAP 8    No results for input(s): PROT, ALBUMIN, AST, ALT, ALKPHOS, BILITOT in the last 168 hours. Lipids No results for input(s): CHOL, TRIG, HDL, LABVLDL, LDLCALC, CHOLHDL in the last 168 hours.  Hematology Recent Labs  Lab 04/20/21 1619  WBC 9.2  RBC 5.03  HGB 15.3  HCT 47.2  MCV 93.8  MCH 30.4  MCHC 32.4  RDW 14.3  PLT 144*   Thyroid No results for input(s): TSH, FREET4 in the last 168 hours.  BNP Recent Labs  Lab 04/20/21 1619  BNP 433.9*    DDimer No results for input(s): DDIMER in the last 168 hours.   Radiology/Studies:  DG Chest 1 View  Result Date: 04/20/2021 CLINICAL DATA:  Shortness of breath generalized weakness and fatigue. EXAM: CHEST  1 VIEW COMPARISON:  Chest radiograph dated April 14, 2021. FINDINGS: The heart is enlarged. Pulmonary vascular congestion with small  bilateral pleural effusions. IMPRESSION: Cardiomegaly. Pulmonary vascular congestion with small bilateral pleural effusions. Electronically Signed   By: Keane Police D.O.   On: 04/20/2021 16:58     Assessment and Plan:   Possible Bradycardia NSR with PVCs H/o Afib, ?permanent Fatigue Patient sent from PCP office for possible bradycardia. Prior notes suggest permanent afib, however in the ER EKG and tele showed SR with frequent PVCs/ventricular bigeminy, which is likely why heart rate is reading so low. Labs showed elevated BNP to 400 and CXR with pulmonary vascular congestion with small b/l pleural effusions, given IV lasix in the ER. Recent echo ordered for fatigue showed normal LVEF, stable from prior. Increase Coreg to suppress PVCs. Check TSH. Continue Eliquis 5mg  BID for stroke ppx. Pt needs cardiology follow-up in 102 weeks.   Chronic diastolic heart failure S/p IV lasix as above for mild volume overload.  CAD No anginal symptoms  reported. Continue medical therapy. No ASA with Eliquis.   HLD Continue home atorvastatin. Most recent LDL was 76.   For questions or updates, please contact [place] Please consult [email or web address] for contact info under    Signed, Donnald Tabar Ninfa Meeker, PA-C  04/21/2021 8:50 AM

## 2021-04-21 NOTE — Telephone Encounter (Signed)
Pt c/o medication issue:  1. Name of Medication: furosemide   2. How are you currently taking this medication (dosage and times per day)? Please advise   3. Are you having a reaction (difficulty breathing--STAT)? No   4. What is your medication issue? Patient wife wants to clarify dose after recent ED visit

## 2021-04-21 NOTE — Telephone Encounter (Signed)
Lynnae Sandhoff from [facility] called to see what pressure they needed to start patient's CPAP on. [facility]'s direct number is [phone].  Please advise.

## 2021-04-21 NOTE — ED Notes (Signed)
Admit / consult  Provider at bedside.

## 2021-04-21 NOTE — Progress Notes (Signed)
Brief Note Regarding History    [name] XTG:[id number] DOB: [birth year] DOA: 04/21/2021  PCP: [name], MD  Patient coming from: Primary Care office Cardiologist: Dr. Fletcher Anon  I have personally briefly reviewed patient's old medical records in [facility].  Chief Complaint: fatigue and heart rate in 30s  HPI: Andre [name] is a 86 y.o. male with medical history significant for CAD (CABG 1998, cath 2703), diastolic CHF (EF 50% by 12/20/79 echocardiogram), paroxysmal atrial fibrillation on Eliquis, Parkinson's disease, history of pulmonary embolism in 2011, pulmonary fibrosis, obstructive sleep apnea, hypertension, hyperlipidemia, hypothyroidism, thrombocytopenia, GERD, who presents to the emergency department on 04/21/21 with fatigue and low heart rate in the 30s.  Update: Initially placed order to admit patient but prior to my seeing the patient, the cardiologist evaluated the patient in the ED and felt he was near his baseline and could follow up outpatient. ED then cancelled the consult to hospitalists to admit the patient. The admit order was deleted. Patient was not seen or admitted by the hospitalist service.   Information below was obtained and reviewed in preparation for his admission and is left here for completeness.     History Review:  Echocardiogram, 02/24/21: IMPRESSIONS   1. Left ventricular ejection fraction, by estimation, is 55%. The left  ventricle has normal function. The left ventricle has no regional wall  motion abnormalities. Left ventricular diastolic parameters are  indeterminate.   2. Right ventricular systolic function is normal. The right ventricular  size is normal.   3. Left atrial size was severely dilated.   4. The mitral valve is normal in structure. No evidence of mitral valve  regurgitation.   5. The aortic valve is calcified. Aortic valve regurgitation is mild.  Mild to moderate aortic valve sclerosis/calcification is present,  without  any evidence of aortic stenosis.   6. The inferior vena cava is dilated in size with >50% respiratory  variability, suggesting right atrial pressure of 8 mmHg.    Past Medical History:  Diagnosis Date   Atherosclerosis of abdominal aorta ([place])    Basal cell carcinoma 03/04/2008   Right nose supratip.    CAD (coronary artery disease)    CABG 1998   Cervical spondylosis 10/01/2013   Chronic diastolic CHF (congestive heart failure) ([place])    a. 07/2016 Echo: >55%; b. 10/2016 Echo: EF 55-60%, Gr1 DD, Ao sclerosis w/o stenosis, sev dil LA; c. 08/2017 Echo: EF 60-65%, no rwma, Gr2 DD, mild AS, sev dil LA/RA.   Coronary artery disease    a. 1998 s/p mini-cabg @ [facility] - LIMA->LAD;  b. 07/2016 St Echo: Inadequate HR w/ HTN response;  c.  08/2016 MV: EF 67%, no ischemia; d. 10/2016 NSTEMI/Cath: RCA 95p (4.0x26 Onyx DES), LIMA->LAD nl; e. 09/2017 Cath: LM 40/30, LAD 100ost, RI 80, LCX nl, OM2/3 nl, RCA patent stent, 38m, LIMA->LAD nl-->Med Rx.   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 s/p DCCV-->maintaining sinus on amiodarone;  b. CHA2DS2VASc = 5-->eliquis.   Parkinson's disease ([place])    tremors   Pleural effusion, right  a. 09/2017 s/p thoracentesis.   PNA (pneumonia) 08/26/2017   Pulmonary embolism ([place]) 2011   Pulmonary fibrosis ([place])    Secondary erythrocytosis 01/28/2015   Sleep apnea    wears CPAP   Squamous cell carcinoma of skin 03/19/2015   Right lateral crown. KA-like pattern   Thrombocytopenia ([place])     Past Surgical History:  Procedure Laterality Date   BACK SURGERY  1960   CARDIAC CATHETERIZATION      CATARACT EXTRACTION Right    CATARACT EXTRACTION Bilateral    CHOLECYSTECTOMY  2010   COLONOSCOPY WITH PROPOFOL N/A 06/07/2018   Procedure: COLONOSCOPY WITH PROPOFOL;  Surgeon: Lollie Sails, MD;  Location: [facility] ENDOSCOPY;  Service: Endoscopy;  Laterality: N/A;   CORONARY ARTERY BYPASS GRAFT  01/07/1997   CORONARY STENT INTERVENTION N/A 10/31/2016   Procedure: Coronary Stent Intervention;  Surgeon: Wellington Hampshire, MD;  Location: [place] CV LAB;  Service: Cardiovascular;  Laterality: N/A;   ELECTROPHYSIOLOGIC STUDY N/A 07/14/2015   Procedure: CARDIOVERSION;  Surgeon: Yolonda Kida, MD;  Location: [facility] ORS;  Service: Cardiovascular;  Laterality: N/A;   ELECTROPHYSIOLOGIC STUDY N/A 10/12/2015   Procedure: CARDIOVERSION;  Surgeon: Minna Merritts, MD;  Location: [facility] ORS;  Service: Cardiovascular;  Laterality: N/A;   LEFT HEART CATH AND CORONARY ANGIOGRAPHY N/A 10/31/2016   Procedure: Left Heart Cath and Coronary Angiography;  Surgeon: Wellington Hampshire, MD;  Location: [place] CV LAB;  Service: Cardiovascular;  Laterality: N/A;   OTHER SURGICAL HISTORY  1998   Bypass   RIGHT/LEFT HEART CATH AND CORONARY ANGIOGRAPHY N/A 09/18/2017   Procedure: RIGHT/LEFT HEART CATH AND CORONARY ANGIOGRAPHY;  Surgeon: Wellington Hampshire, MD;  Location: [place] CV LAB;  Service: Cardiovascular;  Laterality: N/A;    Social History  reports that he quit smoking about 49 years ago. His smoking use included cigarettes, pipe, and cigars. He has a 10.00 pack-year smoking history. He quit smokeless tobacco use about 49 years ago.  His smokeless tobacco use included chew. He reports current alcohol use of about 4.0 standard drinks per week. He reports that he does not use drugs.  Allergies  Allergen Reactions   Pravastatin Other (See Comments)   Prednisone Other (See Comments)    Pt states that med makes him hyper Pt states that med makes him hyper    Family History  Problem Relation  Age of Onset   Alcohol abuse Father    Parkinson's disease Neg Hx      Home Medications  Prior to Admission medications   Medication Sig Start Date End Date Taking? Authorizing Provider  albuterol (VENTOLIN HFA) 108 (90 Base) MCG/ACT inhaler Inhale 2 puffs into the lungs every 6 (six) hours as needed for wheezing or shortness of breath. 04/08/21   Mannam, Hart Robinsons, MD  buPROPion (WELLBUTRIN XL) 300 MG 24 hr tablet TAKE ONE TABLET BY MOUTH EVERY DAY Patient taking differently: Take 300 mg by mouth daily. 08/20/20   [name], MD  carbidopa-levodopa (SINEMET IR) 25-250 MG tablet Take 1 tablet by mouth 4 (four) times daily. 03/04/21   Star Age, MD  carvedilol (COREG) 3.125 MG tablet TAKE TWO TABLETS TWICE A DAY WITH MEALS Patient taking differently: Take 6.25 mg by mouth 2 (two) times daily with a meal. 12/04/20   Arida, Mertie Clause, MD  ELIQUIS 5 MG TABS tablet TAKE ONE TABLET BY MOUTH TWICE DAILY 04/06/21   Wellington Hampshire, MD  entacapone (COMTAN) 200 MG tablet TAKE 1 TABLET BY MOUTH  3 TIMES DAILY Patient taking differently: Take 200 mg by mouth 3 (three) times daily. 07/16/20   Kathrynn Ducking, MD  esomeprazole (NEXIUM) 40 MG capsule TAKE 1 CAPSULE BY MOUTH ONCE DAILY Patient taking differently: Take 40 mg by mouth daily. 03/18/21   [name], MD  feeding supplement (ENSURE ENLIVE / ENSURE PLUS) LIQD Take 237 mLs by mouth 3 (three) times daily between meals. 05/07/20   Danford, Suann Larry, MD  fluticasone (FLONASE) 50 MCG/ACT nasal spray USE 2 PUFFS IN EACH NOSTRIL DAILY Patient taking differently: Place 2 sprays into both nostrils daily. 03/09/21   [name], MD  furosemide (LASIX) 40 MG tablet Take 1 tablet (40 mg total) by mouth daily. 04/13/21 05/13/21  Sreenath, Trula Slade, MD  GEMTESA 75 MG TABS TAKE 1 TABLET BY MOUTH DAILY Patient taking differently: Take 75 mg by mouth daily. 02/22/21   Abbie Sons, MD  hydrocortisone 2.5 % lotion For seborrheic  dermatitis of the face apply to aa's QAM on Tuesday, Thursday, and Saturday. 11/05/20   Ralene Bathe, MD  ketoconazole (NIZORAL) 2 % cream Apply a thin coat to face QAM on Monday, Wednesday, and Friday. 11/05/20   Ralene Bathe, MD  ketoconazole (NIZORAL) 2 % shampoo Shampoo into the face and scalp let sit 5 minutes then wash off. Use 3d/wk. 11/05/20   Ralene Bathe, MD  lactulose (CHRONULAC) 10 GM/15ML solution TAKE 30 MLS BY MOUTH TWICE DAILY AS NEEDED FOR MODERATE CONSTIPATION Patient taking differently: Take 20 g by mouth 2 (two) times daily as needed for moderate constipation. 07/27/20   Crecencio Mc, MD  levothyroxine (SYNTHROID) 50 MCG tablet TAKE ONE TABLET ON AN EMPTY STOMACH WITHA GLASS OF WATER AT LEAST 30 TO 60 MINUTES BEFORE BREAKFAST Patient taking differently: Take 50 mcg by mouth daily before breakfast. 08/20/20   [name], MD  losartan (COZAAR) 100 MG tablet TAKE 1 TABLET BY MOUTH DAILY 11/13/20   [name], MD  potassium chloride SA (KLOR-CON) 10 MEQ tablet Take 1 tablet (10 mEq total) by mouth daily. 12/12/19   Wellington Hampshire, MD  QUEtiapine (SEROQUEL) 25 MG tablet Take 1 tablet (25 mg total) by mouth at bedtime. 1 tablet at night 03/16/21   Star Age, MD  Respiratory Therapy Supplies (FLUTTER) DEVI 1 Device by Does not apply route daily. 05/29/19   Martyn Ehrich, NP  rosuvastatin (CRESTOR) 10 MG tablet TAKE 1 TABLET BY MOUTH DAILY 03/09/21   Wellington Hampshire, MD  sertraline (ZOLOFT) 50 MG tablet TAKE 1 TABLET BY MOUTH DAILY 04/07/21   [name], MD  silodosin (RAPAFLO) 8 MG CAPS capsule TAKE 1 CAPSULE BY MOUTH ONCE DAILY WITH BREAKFAST 03/26/21   Abbie Sons, MD    Objective:  Vitals:   04/21/21 0730 04/21/21 0731 04/21/21 0745 04/21/21 0800  BP: (!) 166/74 (!) 166/73  (!) 167/125  Pulse: (!) 40 (!) 40 (!) 34 (!) 36  Resp:  20  16  Temp:      TempSrc:      SpO2: 98% 96% 96% 97%     Vitals:   04/21/21 0730 04/21/21  0731 04/21/21 0745 04/21/21 0800  BP: (!) 166/74 (!) 166/73  (!) 167/125  Pulse: (!) 40 (!) 40 (!) 34 (!) 36  Resp:  20  16  Temp:      TempSrc:      SpO2: 98% 96% 96% 97%  Labs on Admission: I have personally  reviewed the following labs and imaging studies.  CBC: Recent Labs  Lab 04/20/21 1619  WBC 9.2  HGB 15.3  HCT 47.2  MCV 93.8  PLT 144*    Basic Metabolic Panel: Recent Labs  Lab 04/20/21 1619  NA 139  K 3.9  CL 104  CO2 27  GLUCOSE 140*  BUN 17  CREATININE 0.87  CALCIUM 9.2    GFR: Estimated Creatinine Clearance: 74.3 mL/min (by C-G formula based on SCr of 0.87 mg/dL).    Radiological Exams on Admission: DG Chest 1 View  Result Date: 04/20/2021 CLINICAL DATA:  Shortness of breath generalized weakness and fatigue. EXAM: CHEST  1 VIEW COMPARISON:  Chest radiograph dated April 14, 2021. FINDINGS: The heart is enlarged. Pulmonary vascular congestion with small bilateral pleural effusions. IMPRESSION: Cardiomegaly. Pulmonary vascular congestion with small bilateral pleural effusions. Electronically Signed   By: Keane Police D.O.   On: 04/20/2021 16:58    EKG: Independently reviewed.   EKG 04/20/2021 at 2147: 71 bpm.  Sinus rhythm with PVCs and bigeminy.  QTc 438.  Inverted T in lead III.  Relatively flat T waves in leads II, aVF, V3, V4, V5, and V6.  Assessment/Plan Principal Problem:   Bradycardia     Tacey Ruiz MD [facility]  04/21/2021, 8:39 AM

## 2021-04-22 NOTE — Telephone Encounter (Signed)
[place] but he did not answer. Left message for him to call us back.

## 2021-04-22 NOTE — Telephone Encounter (Signed)
LMTCB for [employer]. Not sure if the sleep study is necessary. Per phone note from 11/19/20 pt had a CPAP titration study and results were given to pt. Phone note from 8/23 pt requesting a new CPAP machine but this wasn't addressed. Pt still requesting new CPAP machine. Pt had CPAP titration study in 10/2020.    Dr. Halford Chessman, please advise if pt can have new CPAP machine and if current order for CPAP titration is even needed. Thanks.

## 2021-04-22 NOTE — Telephone Encounter (Signed)
Andre Mires, MD  CPAP titration 11/05/20 >> CPAP 12 cm H2O >> AHI 0, didn't need supplemental oxygen     Please let him know that he did well during sleep study when his CPAP was turned up to 12 cm H2O.  Please send order to have his CPAP changed to 12 cm H2O.  He didn't need supplemental oxygen during the titration study.      Dr. Halford Chessman, please advise if this cpap titration result is sufficient. Thanks!

## 2021-04-22 NOTE — Telephone Encounter (Signed)
He needs to have CPAP titration study first to determine if he needs supplemental oxygen at night with CPAP.  They can start CPAP titration study on CPAP 10 cm H2O and adjust as needed.

## 2021-04-23 NOTE — Telephone Encounter (Signed)
That works.  He doesn't need another sleep study and this can be cancelled.  Please send order to arrange for new CPAP at 12 cm H2O with heated humidity.

## 2021-04-23 NOTE — Telephone Encounter (Signed)
Called patient but he did not answer. Left message for him to call us back.  

## 2021-04-25 ENCOUNTER — Encounter (HOSPITAL_BASED_OUTPATIENT_CLINIC_OR_DEPARTMENT_OTHER): Payer: HMO | Admitting: Pulmonary Disease

## 2021-04-26 ENCOUNTER — Telehealth: Payer: Self-pay | Admitting: Family Medicine

## 2021-04-26 NOTE — Telephone Encounter (Signed)
Call pt We need more info What symptoms are worsening? Any concern for acute infection ( as this can worsen memory ) such as URI symptoms or UTI symptoms?  Per sonnenberg's last note 03/08/21 Parkinson/dementia is managed by Dr Rexene Alberts, neurology.  He last saw Dr Rexene Alberts 03/04/21 so he would be established. He has follow up with Butler Denmark NP at neurology 07/08/21.   Would he like to move neurology appointment up ?

## 2021-04-26 NOTE — Telephone Encounter (Signed)
I spoke with patient's wife who stated that patient had a very bad day yesterday & that nothing he said made sense. He was talking to people that had been dead for years etc. She said that he was better today but has slept most of the day. She said that she feels is due to his heart failure & he does see Dr. Fletcher Anon tomorrow. I advised that since he was established with [facility] I would call to see if appointment could be moved up sooner than March given episode. I advised that I would call back to let her know.

## 2021-04-26 NOTE — Telephone Encounter (Signed)
Patient wife called in stated that the patient has moderate  dementia and patient wife said patient had an episode all day yesterday and she is not sure if it is moderate. She stated the [facility] Neurology told her to call pcp for referral  ,I  informed Gae Bon what patient wife said  and she stated she would send to the Doc of the day

## 2021-04-26 NOTE — Telephone Encounter (Signed)
Patient wife called in stated that the patient has moderate  dementia and patient wife said patient had an episode all day yesterday and she is not sure if it is moderate. She stated the [facility] Neu[facility] told her to call pcp for referral  ,I  informed Gae Bon what patient wife said  and she stated she would send to the Doc of the day.  Kadelyn Dimascio,cma

## 2021-04-26 NOTE — Telephone Encounter (Signed)
LM on wife's cell as well as home phone to call back office.

## 2021-04-26 NOTE — Telephone Encounter (Signed)
Pt returning call

## 2021-04-27 ENCOUNTER — Other Ambulatory Visit: Payer: Self-pay

## 2021-04-27 ENCOUNTER — Encounter: Payer: Self-pay | Admitting: Internal Medicine

## 2021-04-27 ENCOUNTER — Telehealth: Payer: Self-pay

## 2021-04-27 ENCOUNTER — Encounter: Payer: Self-pay | Admitting: Cardiovascular Disease

## 2021-04-27 ENCOUNTER — Ambulatory Visit (INDEPENDENT_AMBULATORY_CARE_PROVIDER_SITE_OTHER): Payer: HMO | Admitting: Cardiovascular Disease

## 2021-04-27 VITALS — BP 138/80 | HR 76 | Ht 67.0 in | Wt 230.5 lb

## 2021-04-27 DIAGNOSIS — I10 Essential (primary) hypertension: Secondary | ICD-10-CM | POA: Diagnosis not present

## 2021-04-27 DIAGNOSIS — I5032 Chronic diastolic (congestive) heart failure: Secondary | ICD-10-CM | POA: Diagnosis not present

## 2021-04-27 DIAGNOSIS — I2510 Atherosclerotic heart disease of native coronary artery without angina pectoris: Secondary | ICD-10-CM

## 2021-04-27 DIAGNOSIS — E785 Hyperlipidemia, unspecified: Secondary | ICD-10-CM

## 2021-04-27 DIAGNOSIS — I4821 Permanent atrial fibrillation: Secondary | ICD-10-CM

## 2021-04-27 NOTE — Patient Instructions (Signed)

## 2021-04-27 NOTE — Telephone Encounter (Signed)
FYI I have scheduled patient with [place] at 9 am. That was the only appointment that wife could possibly make. She is afraid that she will not get him bc he sleeps late, but I advised that if she could just get him here we would take care of the rest. He also has 11:30 appointment at the heart failure clinic tomorrow as well. She stated that she will do her best so he can have labs & urine checked if needed.

## 2021-04-27 NOTE — Progress Notes (Deleted)
°   Patient ID: Andre [name], male    DOB: 02/04/1936, 86 y.o.   MRN: [id number]  HPI  Andre [name] is a 86 y/o male with a history of  Echo report from 02/24/21 reviewed and showed an EF of 55% along with severe LAE and mild AR.   RHC/LHC done 09/18/17 showed: LM lesion is 40% stenosed. Ost LAD lesion is 100% stenosed. Ost Ramus to Ramus lesion is 80% stenosed. Ramus lesion is 80% stenosed. Previously placed Prox RCA to Mid RCA drug eluting stent is widely patent. Balloon angioplasty was performed. Mid RCA lesion is 40% stenosed. LIMA and is normal in caliber. The graft exhibits no disease. Dist LM lesion is 30% stenosed.   1.  Significant underlying three-vessel coronary artery disease with patent LIMA to LAD and patent RCA stent.  Significant ostial disease in the ramus branch with diffuse disease proximally as well as ostial disease and first diagonal.  These are unchanged from most recent cardiac catheterization. 2.  Right heart catheterization showed mildly elevated filling pressures, minimal pulmonary hypertension and normal cardiac output.  Was in the ED 04/21/21 due to bradycardia, shortness of breath and fatigue. Found to be in HF where he was treated and released. Was in the ED 04/15/21 but LWBS. Admitted 04/11/21 due to shortness of breath initially needing bipap. Initially given IV lasix with transition to oral diuretics. Discharged after 2 days.   He presents today for his initial visit with a chief complaint of   Review of Systems    Physical Exam    Assessment & Plan:  1: Chronic heart failure with preserved ejection fraction with structural changes (LAE)- - NYHA class - BNP 04/20/21 was 433.9  2: HTN- - BP - saw PCP [name]) 03/09/21 - BMP 04/20/21 reviewed and showed sodium 139, potassium 3.9, creatinine 0.87 and GFR >60  3: Pulmonary fibrosis- - saw pulmonology (Mannam) 11/11/20  4: Sleep apnea- - wearing   5: Paroxsymal atrial fibrillation- - saw cardiology  Fletcher Anon) 01/26/21  6: Parkinson's- - saw neurology Rexene Alberts) 03/04/21

## 2021-04-27 NOTE — Telephone Encounter (Signed)
There is a note from Mable Paris NP who is going to have her CMA call pt and schedule an appt first available with a provider at PCP office and have pt keep the appt with Judson Roch NP 1/31 for now.

## 2021-04-27 NOTE — Telephone Encounter (Signed)
I have held 3p appointment for patient tomorrow first available with Sharyn Lull. LM for wife to call back asap.

## 2021-04-27 NOTE — Telephone Encounter (Signed)
Noted, thank you

## 2021-04-27 NOTE — Telephone Encounter (Signed)
I called and lvm for the patient's wife to call back, I called to [facility] and his appointment has been updated to 05/18/2021 with NP Olegario Messier,  The office stated that the patient may need to see a Doctor instead of the NP so she is holding a slot on Monday with the provider but will discuss with the provider first and she stated she will cal the patient if his appointment would be changed to Monday.  Ikeem Cleckler,cma

## 2021-04-27 NOTE — Telephone Encounter (Signed)
Please refer to my response from April 23, 2021.  It is okay to send order to his DME to arrange for new CPAP machine at 12 cm H2O with heated humidity.

## 2021-04-27 NOTE — Progress Notes (Signed)
Cardiology Office Note   Date:  04/27/2021   ID:  Andre [name], DOB 09/20/1935, MRN [id number]  PCP:  Andre Haven, MD  Cardiologist:   Andre Sacramento, MD   Chief Complaint  Patient presents with   Other    F/u hosp. CHF pt sleeping more than normally and headaches most of the time. Meds reviewed verbally with pt.        History of Present Illness: Andre [name] is a 86 y.o. male who presents for a follow-up visit regarding coronary artery disease and atrial fibrillation.   He has known history of coronary artery disease status post 1 vessel CABG with LIMA to LAD in 1998, hypertension, hyperlipidemia, chronic diastolic heart aure,, previous pulmonary embolism, Parkinson's, permanent atrial fibrillation and morbid obesity.  He was hospitalized in July of 2018 with a small non-ST elevation myocardial infarction. Echocardiogram showed normal LV systolic function with severely dilated left atrium. Cardiac catheterization showed occluded native LAD with patent LIMA, severe proximal RCA stenosis with heavy thrombus and moderate distal left main stenosis with significant ostial stenosis in the ramus branch. EF was 50-55% with mildly elevated left ventricular end-diastolic pressure. I performed successful angioplasty and drug-eluting stent placement to the right coronary artery which was complicated by embolization into a large RV branch at the lesion site. This was treated with balloon angioplasty.  A right and left cardiac catheterization was done in June of 2019 due to exertional dyspnea which showed  patent LIMA to LAD and patent RCA stent.  There was significant ostial disease in ramus branch as well as first diagonal.  Both of these were unchanged from previous cardiac catheterization.  Right heart catheterization showed only mildly elevated filling pressures, minimal pulmonary hypertension and normal cardiac output.  Based on that, I felt that his symptoms were mostly  noncardiac.  He had an echocardiogram done in November 2022 which showed normal LV systolic function with calcified aortic valve without significant stenosis.  He saw his primary care physician recently and was noted to have a heart rate of 38 bpm and thus he was sent to the emergency room for admission.  It appears that the EKG at that time showed frequent PVCs and likely the heart rate was miscalculated by the machine.  He was diuresed with IV Lasix and subsequently discharged.  He has been doing reasonably well and denies chest pain or syncope.  He has chronic dizziness.  He continues to complain of extreme fatigue and excessive sleep.  Past Medical History:  Diagnosis Date   Atherosclerosis of abdominal aorta ([place])    Basal cell carcinoma 03/04/2008   Right nose supratip.    CAD (coronary artery disease)    CABG 1998   Cervical spondylosis 10/01/2013   Chronic diastolic CHF (congestive heart failure) ([place])    a. 07/2016 Echo: >55%; b. 10/2016 Echo: EF 55-60%, Gr1 DD, Ao sclerosis w/o stenosis, sev dil LA; c. 08/2017 Echo: EF 60-65%, no rwma, Gr2 DD, mild AS, sev dil LA/RA.   Coronary artery disease    a. 1998 s/p mini-cabg @ [facility] - LIMA->LAD;  b. 07/2016 St Echo: Inadequate HR w/ HTN response;  c.  08/2016 MV: EF 67%, no ischemia; d. 10/2016 NSTEMI/Cath: RCA 95p (4.0x26 Onyx DES), LIMA->LAD nl; e. 09/2017 Cath: LM 40/30, LAD 100ost, RI 80, LCX nl, OM2/3 nl, RCA patent stent, 55m, LIMA->LAD nl-->Med Rx.   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 s/p DCCV-->maintaining sinus on amiodarone;  b. CHA2DS2VASc = 5-->eliquis.    Parkinson's disease ([place])    tremors   Pleural effusion, right    a. 09/2017 s/p thoracentesis.   PNA (pneumonia) 08/26/2017   Pulmonary embolism ([place]) 2011   Pulmonary fibrosis ([place])    Secondary erythrocytosis 01/28/2015   Sleep apnea    wears CPAP   Squamous cell carcinoma of skin 03/19/2015   Right lateral crown. KA-like pattern   Thrombocytopenia ([place])     Past Surgical History:  Procedure Laterality Date   BACK SURGERY  1960   CARDIAC CATHETERIZATION     CATARACT EXTRACTION Right    CATARACT EXTRACTION Bilateral    CHOLECYSTECTOMY  2010   COLONOSCOPY WITH PROPOFOL N/A 06/07/2018   Procedure: COLONOSCOPY WITH PROPOFOL;  Surgeon: Lollie Sails, MD;  Location: [facility] ENDOSCOPY;  Service: Endoscopy;  Laterality: N/A;   CORONARY ARTERY BYPASS GRAFT  01/07/1997   CORONARY STENT INTERVENTION N/A 10/31/2016   Procedure: Coronary Stent Intervention;  Surgeon: Wellington Hampshire, MD;  Location: [place] CV [facility];  Service: Cardiovascular;  Laterality: N/A;   ELECTROPHYSIOLOGIC STUDY N/A 07/14/2015   Procedure: CARDIOVERSION;  Surgeon: Yolonda Kida, MD;  Location: [facility] ORS;  Service: Cardiovascular;  Laterality: N/A;   ELECTROPHYSIOLOGIC STUDY N/A 10/12/2015   Procedure: CARDIOVERSION;  Surgeon: Minna Merritts, MD;  Location: [facility] ORS;  Service: Cardiovascular;  Laterality: N/A;   LEFT HEART CATH AND CORONARY ANGIOGRAPHY N/A 10/31/2016   Procedure: Left Heart Cath and Coronary Angiography;  Surgeon: Wellington Hampshire, MD;  Location: [place] [facility];  Service: Cardiovascular;  Laterality: N/A;   OTHER SURGICAL HISTORY  1998   Bypass   RIGHT/LEFT HEART CATH AND CORONARY ANGIOGRAPHY N/A 09/18/2017   Procedure: RIGHT/LEFT HEART CATH AND CORONARY ANGIOGRAPHY;  Surgeon: Wellington Hampshire, MD;  Location: [place] [facility];  Service: Cardiovascular;  Laterality: N/A;     Current Outpatient Medications  Medication Sig Dispense Refill   albuterol (VENTOLIN HFA) 108 (90  Base) MCG/ACT inhaler Inhale 2 puffs into the lungs every 6 (six) hours as needed for wheezing or shortness of breath. 8 g 6   buPROPion (WELLBUTRIN XL) 300 MG 24 hr tablet TAKE ONE TABLET BY MOUTH EVERY DAY (Patient taking differently: Take 300 mg by mouth daily.) 90 tablet 1   carbidopa-levodopa (SINEMET IR) 25-250 MG tablet Take 1 tablet by mouth 4 (four) times daily. (Patient taking differently: Take 1 tablet by mouth 5 (five) times daily.) 360 tablet 3   carvedilol (COREG) 3.125 MG tablet TAKE TWO TABLETS TWICE A DAY WITH MEALS (Patient taking differently: Take 6.25 mg by mouth 2 (two) times daily with a meal.) 120 tablet 5   ELIQUIS 5 MG TABS tablet TAKE ONE TABLET BY MOUTH TWICE DAILY 180 tablet 1   entacapone (COMTAN) 200 MG tablet TAKE 1 TABLET BY MOUTH 3 TIMES DAILY (Patient taking differently: Take 200 mg by mouth 3 (three) times daily.) 270 tablet 3   esomeprazole (NEXIUM) 40 MG capsule TAKE 1 CAPSULE BY MOUTH ONCE DAILY (Patient taking differently: Take 40 mg by mouth daily.) 30 capsule 2  feeding supplement (ENSURE ENLIVE / ENSURE PLUS) LIQD Take 237 mLs by mouth 3 (three) times daily between meals. 237 mL 12   fluticasone (FLONASE) 50 MCG/ACT nasal spray USE 2 PUFFS IN EACH NOSTRIL DAILY (Patient taking differently: Place 2 sprays into both nostrils daily.) 16 g 3   furosemide (LASIX) 40 MG tablet Take 1 tablet (40 mg total) by mouth daily. 30 tablet 0   GEMTESA 75 MG TABS TAKE 1 TABLET BY MOUTH DAILY (Patient taking differently: Take 75 mg by mouth daily.) 30 tablet 6   hydrocortisone 2.5 % lotion For seborrheic dermatitis of the face apply to aa's QAM on Tuesday, Thursday, and Saturday. 59 mL 0   ketoconazole (NIZORAL) 2 % cream Apply a thin coat to face QAM on Monday, Wednesday, and Friday. 60 g 3   ketoconazole (NIZORAL) 2 % shampoo Shampoo into the face and scalp let sit 5 minutes then wash off. Use 3d/wk. 120 mL 3   lactulose (CHRONULAC) 10 GM/15ML solution TAKE 30 MLS BY MOUTH  TWICE DAILY AS NEEDED FOR MODERATE CONSTIPATION (Patient taking differently: Take 20 g by mouth 2 (two) times daily as needed for moderate constipation.) 236 mL 0   levothyroxine (SYNTHROID) 50 MCG tablet TAKE ONE TABLET ON AN EMPTY STOMACH WITHA GLASS OF WATER AT LEAST 30 TO 60 MINUTES BEFORE BREAKFAST (Patient taking differently: Take 50 mcg by mouth daily before breakfast.) 90 tablet 1   losartan (COZAAR) 100 MG tablet TAKE 1 TABLET BY MOUTH DAILY 90 tablet 1   potassium chloride SA (KLOR-CON) 10 MEQ tablet Take 1 tablet (10 mEq total) by mouth daily. 90 tablet 1   QUEtiapine (SEROQUEL) 25 MG tablet Take 1 tablet (25 mg total) by mouth at bedtime. 1 tablet at night 90 tablet 1   Respiratory Therapy Supplies (FLUTTER) DEVI 1 Device by Does not apply route daily. 1 each 0   rosuvastatin (CRESTOR) 10 MG tablet TAKE 1 TABLET BY MOUTH DAILY 90 tablet 0   sertraline (ZOLOFT) 50 MG tablet TAKE 1 TABLET BY MOUTH DAILY 30 tablet 3   silodosin (RAPAFLO) 8 MG CAPS capsule TAKE 1 CAPSULE BY MOUTH ONCE DAILY WITH BREAKFAST 30 capsule 3   No current facility-administered medications for this visit.    Allergies:   Pravastatin and Prednisone    Social History:  The patient  reports that he quit smoking about 49 years ago. His smoking use included cigarettes, pipe, and cigars. He has a 10.00 pack-year smoking history. He quit smokeless tobacco use about 49 years ago.  His smokeless tobacco use included chew. He reports current alcohol use of about 4.0 standard drinks per week. He reports that he does not use drugs.   Family History:  The patient's family history includes Alcohol abuse in his father.    ROS:  Please see the history of present illness.   Otherwise, review of systems are positive for none.   All other systems are reviewed and negative.    PHYSICAL EXAM: VS:  BP 138/80 (BP Location: Left Arm, Patient Position: Sitting, Cuff Size: Normal)    Pulse 76    Ht 5\' 7"  (1.702 m)    Wt 230 lb 8 oz  (104.6 kg)    SpO2 97%    BMI 36.10 kg/m  , BMI Body mass index is 36.1 kg/m. GEN: Well nourished, well developed, in no acute distress  HEENT: normal  Neck: no JVD, carotid bruits, or masses Cardiac: Regular rate and rhythm; no rubs, or gallops,  trace bilateral leg edema.  2 /6 systolic murmur in the aortic area. Respiratory:  clear to auscultation bilaterally with mildly diminished breath sounds, normal work of breathing GI: soft, nontender, nondistended, + BS MS: no deformity or atrophy  Skin: warm and dry, no rash Neuro:  Strength and sensation are intact Psych: euthymic mood, full affect  EKG:  EKG is ordered today. The ekg ordered today demonstrates sinus rhythm with motion artifact and nonspecific ST changes.    Recent Labs: 06/02/2020: TSH 2.77 04/11/2021: ALT 12 04/12/2021: Magnesium 2.0 04/20/2021: B Natriuretic Peptide 433.9; BUN 17; Creatinine, Ser 0.87; Hemoglobin 15.3; Platelets 144; Potassium 3.9; Sodium 139    Lipid Panel    Component Value Date/Time   CHOL 146 08/27/2017 0419   TRIG 134 08/27/2017 0419   HDL 43 08/27/2017 0419   CHOLHDL 3.4 08/27/2017 0419   VLDL 27 08/27/2017 0419   LDLCALC 76 08/27/2017 0419      Wt Readings from Last 3 Encounters:  04/27/21 230 lb 8 oz (104.6 kg)  04/20/21 231 lb 9.6 oz (105.1 kg)  04/14/21 233 lb 14.5 oz (106.1 kg)       No flowsheet data found.    ASSESSMENT AND PLAN:  1. Coronary artery disease involving native coronary arteries without angina: He is doing reasonably well from a cardiac standpoint with no anginal symptoms.  Continue medical therapy.  No antiplatelet medications given that he is on Eliquis.   2.  Persistent atrial fibrillation: He was thought to have permanent atrial fibrillation but surprisingly he is in sinus rhythm.  Continue small dose carvedilol and long-term anticoagulation with Eliquis.  Recent reported bradycardia likely in the setting of frequent PVCs.    3. Essential hypertension:  Blood pressure is controlled on current medications.   4. Hyperlipidemia: He had myalgia with atorvastatin but he is tolerating rosuvastatin.  Most recent LDL was 76.   5.  Chronic diastolic heart failure: His dose of Lasix was recently increased to 40 mg once daily after his recent hospitalization.  He appears to be euvolemic today.   Disposition:   FU with me in 6 months  Signed,  Andre Sacramento, MD  04/27/2021 1:51 PM    [place]

## 2021-04-27 NOTE — Telephone Encounter (Signed)
Order placed in 11/2020 for pressure to be changed to 12cm. Titration study was cancelled by pt due to other reasons per appt desk. If pt still desires new machine can we place order for this? Thanks.    Dr. Halford Chessman, please advise if ok to order new cpap machine for pt as his is over 86 years old. Thank you!

## 2021-04-27 NOTE — Telephone Encounter (Signed)
Call pt  I can see phone note from Dr Rexene Alberts whom recommends evaluation to ensure no infection, lab abnormality which may be causing acute change  Please sch with first avail provider here.   Advise to keep appt with sarah slask 05/18/21

## 2021-04-27 NOTE — Telephone Encounter (Signed)
We received a call from this patients primary care office. The patients wife called in to them advising of worsening dementia symptoms, she states the patient was talking to people that had been dead for years etc. The PCP office was trying to see if the patients f/u with Judson Roch could be moved sooner than March. I currently have it moved up to the end of January, but I wanted to see if he should see Dr Rexene Alberts for these new concerns instead. There is an appointment on hold with her for him on Monday 1/16. Can you please discuss this with her to see if she feels this needs to be addressed by her at an appointment?  I don't mind reaching out to the patients wife once a decision is made.  Thanks!

## 2021-04-27 NOTE — Telephone Encounter (Signed)
Given the acute and dramatic change in his mental state and hallucinations I would recommend urgent follow-up with primary care first.  Sometimes a change in electrolytes, dehydration, an infection, or poor sleep with sleep deprivation can bring on acute changes.  I would recommend urgent follow-up with primary care.  He was recently in the emergency room as well.

## 2021-04-28 ENCOUNTER — Ambulatory Visit: Payer: HMO | Admitting: Family

## 2021-04-28 ENCOUNTER — Encounter: Payer: Self-pay | Admitting: Internal Medicine

## 2021-04-28 ENCOUNTER — Telehealth: Payer: Self-pay | Admitting: Family

## 2021-04-28 ENCOUNTER — Telehealth: Payer: Self-pay | Admitting: Neurology

## 2021-04-28 ENCOUNTER — Telehealth: Payer: Self-pay | Admitting: Family Medicine

## 2021-04-28 NOTE — Telephone Encounter (Signed)
Patient wife DPR came into office stating patient was to come here and have labs drawn for neurology advised her there are no orders in for patient to have labs , she could either have neurology put orders in and schedule lab appt, or we Neurologist would need give printed order for labs , patient DPR stated she would have neurologist to give orders to have drawn at [facility].

## 2021-04-28 NOTE — Telephone Encounter (Signed)
Order placed for pt to receive new cpap machine from [facility]. Called and spoke with pt's spouse letting her know this had been done and she verbalized understanding. Nothing further needed.

## 2021-04-28 NOTE — Telephone Encounter (Signed)
Pt's wife called stating that in the last visit it was mentioned that the pt was to get labs done and they would like to know if these orders can be sent to the [facility] in [facility]. Please advise.

## 2021-04-28 NOTE — Telephone Encounter (Signed)
I called wife of pt.  He was seen by pcp, he did not have any additional lab work drawn. They saw Mable Paris, NP.  Wife said they did not do anything.  No recommendations.  I relayed that she can call them back to verify that and she did not know what was next.  He continues with hallucinations.  Pt has appt with SS/ NP 05-18-2021.  Is on waitlist as I did not have anything sooner.  He had labs drawn 04-21-2021 in ED.  He was sleeping at this time.

## 2021-04-28 NOTE — Telephone Encounter (Signed)
Patient did not show for his [facility] appointment on 04/28/21. Will attempt to reschedule.

## 2021-04-29 NOTE — Telephone Encounter (Signed)
Noted Pt did not come to appt

## 2021-04-29 NOTE — Telephone Encounter (Signed)
Wife call back to schedule appt. I did schedule appt for Monday to address hallucinations.  These come and go.  He has been ill and seen in ED 04-21-2021.  Recents labs done at that time.  I relayed that Mable Paris, NP did have appt for pt on 04-28-2021 he no showed then went to there office at 1530 on 04-28-2021 to be seen anyway.  Wife said very confusing.  Thought he was going to have some labwork done.  I relayed that if he worsens over weekend to see pcp/ UC./ED.  we can see for his PD hallucinations.  I encouraged hydration, high protein shakes.  I did not cancel the appt with Judson Roch NP for now.  I did make 05-03-21 at 145pm.

## 2021-04-29 NOTE — Telephone Encounter (Signed)
Dr Rexene Alberts and Katharine Look,   I wanted you to be aware that Judson Roch had scheduled patient to be seen by me on 04/28/21  and he no showed. We had also offered a 830 appt 04/28/21 as well which patient wife declined.   I did not see patient so that is inaccurate. I wanted you to know.

## 2021-04-29 NOTE — Telephone Encounter (Signed)
I called wife and offered appt for Monday 05-03-2021 at 1345.  She will let me know she has another appt Monday but was not sure of time.

## 2021-04-30 ENCOUNTER — Ambulatory Visit: Admit: 2021-04-30 | Discharge status: Against Medical Advice | Payer: HMO

## 2021-04-30 DIAGNOSIS — Z03818 Encounter for observation for suspected exposure to other biological agents ruled out: Secondary | ICD-10-CM | POA: Diagnosis not present

## 2021-04-30 DIAGNOSIS — Z20822 Contact with and (suspected) exposure to covid-19: Secondary | ICD-10-CM | POA: Diagnosis not present

## 2021-05-03 ENCOUNTER — Other Ambulatory Visit: Payer: Self-pay

## 2021-05-03 ENCOUNTER — Ambulatory Visit: Payer: HMO | Admitting: Neurology

## 2021-05-03 VITALS — BP 170/89 | HR 83 | Ht 66.0 in | Wt 224.0 lb

## 2021-05-03 DIAGNOSIS — R410 Disorientation, unspecified: Secondary | ICD-10-CM | POA: Diagnosis not present

## 2021-05-03 DIAGNOSIS — G20 Parkinson's disease: Secondary | ICD-10-CM | POA: Diagnosis not present

## 2021-05-03 DIAGNOSIS — R630 Anorexia: Secondary | ICD-10-CM | POA: Diagnosis not present

## 2021-05-03 DIAGNOSIS — R110 Nausea: Secondary | ICD-10-CM

## 2021-05-03 DIAGNOSIS — R443 Hallucinations, unspecified: Secondary | ICD-10-CM | POA: Diagnosis not present

## 2021-05-03 DIAGNOSIS — R197 Diarrhea, unspecified: Secondary | ICD-10-CM

## 2021-05-03 MED ORDER — CARBIDOPA-LEVODOPA 25-250 MG PO TABS
0.5000 | ORAL_TABLET | Freq: Four times a day (QID) | ORAL | 3 refills | Status: DC
Start: 2021-05-03 — End: 2022-01-24

## 2021-05-03 NOTE — Progress Notes (Signed)
Subjective:    Patient ID: Andre [name] is a 86 y.o. male.  HPI    Interim history:   Andre [name] is an 86 year old right-handed gentleman with an underlying complex medical history of coronary artery disease, chronic diastolic congestive heart failure, obstructive sleep apnea, hypertension, hyperlipidemia, paroxysmal A. fib, on Eliquis, degenerative cervical and lumbar disc disease, squamous cell cancer, reflux disease, interstitial lung disease, thrombocytopenia, depression and obesity, who presents for follow-up consultation of his parkinsonism, complicated by memory loss, hallucinations and recurrent falls.  The patient is accompanied by his wife today and presents for a sooner than scheduled visit. I first met him on 03/04/2021, at which time his wife reported that he was sleeping a lot during the day.  He was advised to change his carbidopa-levodopa to 1 pill 4 times a day, he was on the 25-250 mg strength.  He was advised to continue with entacapone 200 mg 3 times daily.  Follow-up for his sleep apnea with his sleep specialist and encouraged to use his CPAP consistently.  He was advised to continue with Seroquel for hallucinations.  In the interim, he was hospitalized in December 2022 for hypoxia.  He was in the hospital between 04/11/2021 and 04/13/2021.  He has not had a follow-up with his primary care yet.  He was supposed to also follow-up with cardiology and pulmonology.  He had shortness of breath lasting for 2 days prior to arrival and EMS noted wheezing.  He was placed on BiPAP therapy.  He was felt to have acute exacerbation of CHF.  He presented to the emergency room on 04/14/2021 after a fall.  I reviewed the emergency room records from [facility] .  He had a head CT without contrast and cervical spine CT without contrast on 04/14/2021 and I reviewed the results: IMPRESSION: 1.  No acute intracranial process. 2.  No acute fracture or traumatic listhesis in the cervical  spine.   He did not stay to get fully evaluated by the provider.  He presented to the emergency room on 04/20/2021 with shortness of breath.  He also complained of fatigue.  I reviewed the emergency room records.  He was noted to have bradycardia.  EKG showed frequent PVCs.  Admission to the hospitalist service was planned but cardiology then recommended outpatient follow-up.   He is supposed to have a new CPAP machine through pulmonology.  The patient was advised to follow-up with [place] due to worsening hallucinations.  Today, 05/03/2021: He reports not feeling well.  He feels that the Parkinson's medicine makes him nauseated.  She feels that he has had more confusion.  He does not sleep well.  She reports problems with his CPAP mask.  He has chronic neck pain and sees orthopedics, may need another injection, had received epidural steroid injections into his neck.  He had a recent PCP visit scheduled for 04/28/21, but did not end up getting seen.  His wife reports that they went to the primary care but his heart rate was so low and he was advised to go back to the emergency room.  It does not look like they were actually seen in the ER, no records in epic.  She reports that cardiologist explained to them that he has PVCs.  For the past several days he has had nausea and diarrhea.  He has lost of appetite and has not taken his Parkinson's medicine on a regular basis.  He did not take the Seroquel last night.  She  believes that he could be dehydrated.    The patient's allergies, current medications, family history, past medical history, past social history, past surgical history and problem list were reviewed and updated as appropriate.    Previously:   03/04/21: He was previously followed by Dr. Jannifer Franklin and last saw him on 10/22/2020 and I reviewed the note, I copied the note below for reference.  He has been on Sinemet.  He is also on entacapone.  Provigil was prescribed recently for daytime somnolence.  He  has since then been diagnosed with obstructive sleep apnea and started on CPAP therapy.  He is followed by pulmonology.  Of note, he has been on Seroquel 25 mg strength 1 pill at bedtime.  His Provigil is currently 200 mg twice daily and it was increased in July 2022 by Dr. Jannifer Franklin.  He has been on Sinemet 25-250 mg strength.   I have also reviewed his chart, he had seen Dr. Jennelle Human in the past, I was able to review an office note from 2015.  He started seeing Dr. Jannifer Franklin in 2019 or thereabouts.  Dr. Nicki Reaper originally diagnosed the patient with idiopathic Parkinson's disease in 2010.  Patient has been on Sinemet since then.  He had complaints of daytime somnolence even back in 2010 and 2011.   10/22/20 (Dr. Jannifer Franklin): Andre [name] is an 86 year old right-handed white male with a history of Parkinson's disease.  He has had a reduction in the Sinemet dosing recently, he has tolerated this well.  He takes 1.5 of the 25/250 mg Sinemet tablets 3 times daily.  He has sleep apnea on CPAP, he has had a recent evaluation, they have noted desaturations and his oxygen at night, he will be going for a CPAP titration in the near future.  He is still has a lot of daytime drowsiness, Provigil was recently added taking 200 mg in the morning.  The patient believes that this has resulted in increased energy but his wife indicates that he is still sleeping quite a bit during the day.  The patient has gait instability, he will fall on occasion, he fell a week ago when he tried to sit down on the toilet and missed the toilet.  The patient has a cane and a walker at home but he does not use them.  He has had physical therapy for his walking in the early part of 2022.  He returns today for further evaluation.  He does not operate a motor vehicle.  He reports that his hallucinations still occur but are not concerning to him, he may see a bug every once in a while, but nothing that is threatening to him.  He does take Seroquel at  night.  His Past Medical History Is Significant For: Past Medical History:  Diagnosis Date   Atherosclerosis of abdominal aorta ([place])    Basal cell carcinoma 03/04/2008   Right nose supratip.    CAD (coronary artery disease)    CABG 1998   Cervical spondylosis 10/01/2013   Chronic diastolic CHF (congestive heart failure) ([place])    a. 07/2016 Echo: >55%; b. 10/2016 Echo: EF 55-60%, Gr1 DD, Ao sclerosis w/o stenosis, sev dil LA; c. 08/2017 Echo: EF 60-65%, no rwma, Gr2 DD, mild AS, sev dil LA/RA.   Coronary artery disease    a. 1998 s/p mini-cabg @ [facility] - LIMA->LAD;  b. 07/2016 St Echo: Inadequate HR w/ HTN response;  c.  08/2016 MV: EF 67%, no ischemia; d. 10/2016 NSTEMI/Cath: RCA 95p (  4.0x26 Onyx DES), LIMA->LAD nl; e. 09/2017 Cath: LM 40/30, LAD 100ost, RI 80, LCX nl, OM2/3 nl, RCA patent stent, 30m LIMA->LAD nl-->Med Rx.   DDD (degenerative disc disease), cervical    DDD (degenerative disc disease), lumbar    [place] with [place] 06/03/2020   Depression    Gait abnormality 07/31/2019   GERD (gastroesophageal reflux disease)    History of SCC (squamous cell carcinoma) of skin 07/27/2020   right forearm / ED[facility]   Hyperlipidemia    Hypertension    Hypothyroidism    PAF (paroxysmal atrial fibrillation) ([facility]    a. s/p DCCV-->maintaining sinus on amiodarone;  b. CHA2DS2VASc = 5-->eliquis.   Parkinson's disease ([facility]    tremors   Pleural effusion, right    a. 09/2017 s/p thoracentesis.   PNA (pneumonia) 08/26/2017   Pulmonary embolism ([facility] [place]   Pulmonary fibrosis ([facility]    Secondary erythrocytosis 01/28/2015   Sleep apnea    wears CPAP   Squamous cell carcinoma of skin 03/19/2015   Right lateral crown. KA-like pattern   Thrombocytopenia ([facility]     His Past Surgical History Is Significant For: Past Surgical History:  Procedure Laterality Date   BACK SURGERY  1960   CARDIAC CATHETERIZATION     CATARACT EXTRACTION Right    CATARACT EXTRACTION Bilateral    CHOLECYSTECTOMY   2010   COLONOSCOPY WITH PROPOFOL N/A 06/07/2018   Procedure: COLONOSCOPY WITH PROPOFOL;  Surgeon: SLollie Sails MD;  [facility];  Service: Endoscopy;  Laterality: N/A;   CORONARY ARTERY BYPASS GRAFT  01/07/1997   CORONARY STENT INTERVENTION N/A 10/31/2016   Procedure: Coronary Stent Intervention;  Surgeon: AWellington Hampshire MD;  [facility];  Service: Cardiovascular;  Laterality: N/A;   ELECTROPHYSIOLOGIC STUDY N/A 07/14/2015   Procedure: CARDIOVERSION;  Surgeon: DYolonda Kida MD;  Location: [facility] ORS;  Service: Cardiovascular;  Laterality: N/A;   ELECTROPHYSIOLOGIC STUDY N/A 10/12/2015   Procedure: CARDIOVERSION;  Surgeon: TMinna Merritts MD;  Location: [facility] ORS;  Service: Cardiovascular;  Laterality: N/A;   LEFT HEART CATH AND CORONARY ANGIOGRAPHY N/A 10/31/2016   Procedure: Left Heart Cath and Coronary Angiography;  Surgeon: AWellington Hampshire MD;  [facility];  Service: Cardiovascular;  Laterality: N/A;   OTHER SURGICAL HISTORY  1998   Bypass   RIGHT/LEFT HEART CATH AND CORONARY ANGIOGRAPHY N/A 09/18/2017   Procedure: RIGHT/LEFT HEART CATH AND CORONARY ANGIOGRAPHY;  Surgeon: AWellington Hampshire MD;  [facility];  Service: Cardiovascular;  Laterality: N/A;    His Family History Is Significant For: Family History  Problem Relation Age of Onset   Alcohol abuse Father    Parkinson's disease Neg Hx     His Social History Is Significant For: Social History   Socioeconomic History   Marital status: Married    Spouse name: Andre [name]  Number of children: 1   Years of education: 12   Highest education level: Not on file  Occupational History   Occupation: Retired    Comment: eDesigner, television/film set Tobacco Use   Smoking status: Former    Packs/day: 1.00    Years: 10.00    Pack years: 10.00    Types: Cigarettes, Pipe, Cigars    Quit date: 04/18/1972    Years since quitting: 49.0   Smokeless tobacco:  Former    Types: Chew    Quit date: 04/18/1972  Vaping Use   Vaping Use: Never used  Substance and Sexual  Activity   Alcohol use: Yes    Alcohol/week: 4.0 standard drinks    Types: 2 Cans of beer, 2 Shots of liquor per week    Comment: per 2 weeks    Drug use: No   Sexual activity: Yes  Other Topics Concern   Not on file  Social History Narrative   Lives w/ wife   Caffeine use: none   Right-handed   Social Determinants of Radio broadcast assistant Strain: Not on file  Food Insecurity: Not on file  Transportation Needs: Not on file  Physical Activity: Not on file  Stress: Not on file  Social Connections: Not on file    His Allergies Are:  Allergies  Allergen Reactions   Pravastatin Other (See Comments)   Prednisone Other (See Comments)    Pt states that med makes him hyper Pt states that med makes him hyper  :   His Current Medications Are:  Outpatient Encounter Medications as of 05/03/2021  Medication Sig   albuterol (VENTOLIN HFA) 108 (90 Base) MCG/ACT inhaler Inhale 2 puffs into the lungs every 6 (six) hours as needed for wheezing or shortness of breath.   buPROPion (WELLBUTRIN XL) 300 MG 24 hr tablet TAKE ONE TABLET BY MOUTH EVERY DAY (Patient taking differently: Take 300 mg by mouth daily.)   carvedilol (COREG) 3.125 MG tablet TAKE TWO TABLETS TWICE A DAY WITH MEALS (Patient taking differently: Take 6.25 mg by mouth 2 (two) times daily with a meal.)   ELIQUIS 5 MG TABS tablet TAKE ONE TABLET BY MOUTH TWICE DAILY   entacapone (COMTAN) 200 MG tablet TAKE 1 TABLET BY MOUTH 3 TIMES DAILY (Patient taking differently: Take 200 mg by mouth 3 (three) times daily.)   esomeprazole (NEXIUM) 40 MG capsule TAKE 1 CAPSULE BY MOUTH ONCE DAILY (Patient taking differently: Take 40 mg by mouth daily.)   feeding supplement (ENSURE ENLIVE / ENSURE PLUS) LIQD Take 237 mLs by mouth 3 (three) times daily between meals.   fluticasone (FLONASE) 50 MCG/ACT nasal spray USE 2 PUFFS IN EACH  NOSTRIL DAILY (Patient taking differently: Place 2 sprays into both nostrils daily.)   furosemide (LASIX) 40 MG tablet Take 1 tablet (40 mg total) by mouth daily.   GEMTESA 75 MG TABS TAKE 1 TABLET BY MOUTH DAILY (Patient taking differently: Take 75 mg by mouth daily.)   hydrocortisone 2.5 % lotion For seborrheic dermatitis of the face apply to aa's QAM on Tuesday, Thursday, and Saturday.   ketoconazole (NIZORAL) 2 % cream Apply a thin coat to face QAM on Monday, Wednesday, and Friday.   ketoconazole (NIZORAL) 2 % shampoo Shampoo into the face and scalp let sit 5 minutes then wash off. Use 3d/wk.   lactulose (CHRONULAC) 10 GM/15ML solution TAKE 30 MLS BY MOUTH TWICE DAILY AS NEEDED FOR MODERATE CONSTIPATION (Patient taking differently: Take 20 g by mouth 2 (two) times daily as needed for moderate constipation.)   levothyroxine (SYNTHROID) 50 MCG tablet TAKE ONE TABLET ON AN EMPTY STOMACH WITHA GLASS OF WATER AT LEAST 30 TO 60 MINUTES BEFORE BREAKFAST (Patient taking differently: Take 50 mcg by mouth daily before breakfast.)   losartan (COZAAR) 100 MG tablet TAKE 1 TABLET BY MOUTH DAILY   potassium chloride SA (KLOR-CON) 10 MEQ tablet Take 1 tablet (10 mEq total) by mouth daily.   QUEtiapine (SEROQUEL) 25 MG tablet Take 1 tablet (25 mg total) by mouth at bedtime. 1 tablet at night   Respiratory Therapy Supplies (FLUTTER) DEVI 1 Device  by Does not apply route daily.   rosuvastatin (CRESTOR) 10 MG tablet TAKE 1 TABLET BY MOUTH DAILY   sertraline (ZOLOFT) 50 MG tablet TAKE 1 TABLET BY MOUTH DAILY   silodosin (RAPAFLO) 8 MG CAPS capsule TAKE 1 CAPSULE BY MOUTH ONCE DAILY WITH BREAKFAST   [DISCONTINUED] carbidopa-levodopa (SINEMET IR) 25-250 MG tablet Take 1 tablet by mouth 4 (four) times daily. (Patient taking differently: Take 1 tablet by mouth 5 (five) times daily.)   carbidopa-levodopa (SINEMET IR) 25-250 MG tablet Take 0.5 tablets by mouth 4 (four) times daily.   No facility-administered encounter  medications on file as of 05/03/2021.  :  Review of Systems:  Out of a complete 14 point review of systems, all are reviewed and negative with the exception of these symptoms as listed below:  Review of Systems  Neurological:        Pt is here for parkinson's  follow up.Wife states pt is confused some hallucinations. Pt states that he has not taken medication in a week because of stomach virus. Wife wants to discuss medications. Pt gait is off. . Wife has questions  about Sinemet    Objective:  Neurological Exam  Physical Exam Physical Examination:   Vitals:   05/03/21 1353  BP: (!) 170/89  Pulse: 83    General Examination: The patient is a very pleasant 86 y.o. male in no acute distress. He appears well-developed and well-nourished and well groomed.   HEENT: Normocephalic, atraumatic, pupils are equal, round and reactive to light, extraocular tracking is mildly impaired, hearing is impaired, has hearing aids.  Face is symmetric, mild facial masking noted, mild to moderate nuchal rigidity.  Airway examination reveals mild to moderate mouth dryness, no carotid bruits, no lip, neck or jaw tremor.  Mild hypophonia, mild to moderate dysarthria.     Chest: Clear to auscultation without wheezing, rhonchi or crackles noted.   Heart: S1+S2+0, regular and normal without murmurs, rubs or gallops noted.    Abdomen: Soft, non-tender and non-distended.  Bowel sounds present.   Extremities: There is mild swelling in both lower extremities.    Skin: Warm and dry with, chronic appearing changes including chronic bruising.   Musculoskeletal: exam reveals no obvious joint deformities.    Neurologically:  Mental status: The patient is awake, alert and oriented in all 4 spheres. His immediate and remote memory, attention, language skills and fund of knowledge are impaired.  He is also hard of hearing, history is primarily provided by his wife.    Cranial nerves II - XII are as described above  under HEENT exam.  Motor exam: Normal bulk, global strength of 4/5, intermittent resting tremor in the left hand noted. No significant dyskinesias and but has cocontraction in the left upper extremity.  Fine motor skills are globally impaired in the moderate range, left-sided lateralization noted. Cerebellar testing: No dysmetria or intention tremor. There is no truncal or gait ataxia.  Sensory exam: intact to light touch in the upper and lower extremities.  Gait, station and balance: He stands with mild difficulty, posture is mildly stooped for age.  He walks with mild dystonic posturing of the left arm, no telltale shuffling, no walking aid, mild gait instability noted.    Assessment and Plan:    In summary, Andre [name] is a very pleasant 86 year old male with an underlying complex medical history of coronary artery disease, chronic diastolic congestive heart failure with recent exacerbation, obstructive sleep apnea, hypertension, hyperlipidemia, paroxysmal A. fib, on  Eliquis, degenerative cervical and lumbar disc disease, squamous cell cancer, reflux disease, interstitial lung disease, thrombocytopenia, depression and obesity, who presents for follow-up consultation of his parkinsonism, complicated by memory loss, hallucinations and recurrent falls, as well as significant daytime somnolence.  He has had more confusion and hallucinations per wife.  He is on Seroquel and is advised to continue with.  He has not been taking his Parkinson's medicines for the past few days and the wife reports that he has had a stomach virus.  We talked about the importance of nutrition, hydration, and getting enough rest.  He is advised to follow-up with all his providers as scheduled and contact his sleep specialist if he continues to have problems with his CPAP.  We will do some blood work today and call them with the results.  He is advised to reduce his Sinemet from 25-250 mg strength 1 pill 4 times daily to half a pill  4 times daily.  He can continue with entacapone 1 pill 3 times daily and Seroquel 25 mg at bedtime.  I would like for him to take Sinemet 25-250 mg strength 1/2 pill 4 times a day at 9 AM, 1 PM, 5 PM and 9 PM daily.  He is going to stay off of modafinil, he discontinued 15 minutes ago. He is continuing with Comtan 1 pill 3 times daily at 9 AM, 1 PM, 5 PM daily.  He can continue with Seroquel 25 mg at bedtime. We can potentially consider Nuplazid in the near future for hallucinations associated with Parkinson's disease.  He is advised to follow-up in this clinic to see one of our nurse practitioners in 3 months, sooner if needed.  I answered all their questions today and the patient and his wife are in agreement.  I spent 30 minutes in total face-to-face time and in reviewing records during pre-charting, more than 50% of which was spent in counseling and coordination of care, reviewing test results, reviewing medications and treatment regimen and/or in discussing or reviewing the diagnosis of PD, the prognosis and treatment options. Pertinent laboratory and imaging test results that were available during this visit with the patient were reviewed by me and considered in my medical decision making (see chart for details).

## 2021-05-03 NOTE — Patient Instructions (Signed)
It was nice to see you both again today.  I am sorry you are having trouble with nausea and diarrhea.  We will do some blood work today to make sure you are not dehydrated or have an infection.  You will need to follow-up with your primary care physician.  We will call you with your blood test results.  We will reduce your Sinemet 25-250 mg strength to half a pill 4 times daily, please continue with Seroquel 25 mg at bedtime and entacapone 200 mg 3 times daily.  Please follow-up in about 3 months to see Butler Denmark, nurse practitioner.

## 2021-05-04 ENCOUNTER — Telehealth: Payer: Self-pay | Admitting: *Deleted

## 2021-05-04 LAB — CBC WITH DIFFERENTIAL/PLATELET
Basophils Absolute: 0 10*3/uL (ref 0.0–0.2)
Basos: 0 %
EOS (ABSOLUTE): 0.1 10*3/uL (ref 0.0–0.4)
Eos: 1 %
Hematocrit: 50.9 % (ref 37.5–51.0)
Hemoglobin: 17 g/dL (ref 13.0–17.7)
Immature Grans (Abs): 0 10*3/uL (ref 0.0–0.1)
Immature Granulocytes: 0 %
Lymphocytes Absolute: 2.4 10*3/uL (ref 0.7–3.1)
Lymphs: 19 %
MCH: 29.9 pg (ref 26.6–33.0)
MCHC: 33.4 g/dL (ref 31.5–35.7)
MCV: 90 fL (ref 79–97)
Monocytes Absolute: 1 10*3/uL — ABNORMAL HIGH (ref 0.1–0.9)
Monocytes: 8 %
Neutrophils Absolute: 9 10*3/uL — ABNORMAL HIGH (ref 1.4–7.0)
Neutrophils: 72 %
Platelets: 149 10*3/uL — ABNORMAL LOW (ref 150–450)
RBC: 5.69 x10E6/uL (ref 4.14–5.80)
RDW: 13.7 % (ref 11.6–15.4)
WBC: 12.6 10*3/uL — ABNORMAL HIGH (ref 3.4–10.8)

## 2021-05-04 LAB — AMYLASE: Amylase: 17 U/L — ABNORMAL LOW (ref 31–110)

## 2021-05-04 LAB — COMPREHENSIVE METABOLIC PANEL
ALT: 18 IU/L (ref 0–44)
AST: 21 IU/L (ref 0–40)
Albumin/Globulin Ratio: 1.8 (ref 1.2–2.2)
Albumin: 4.4 g/dL (ref 3.6–4.6)
Alkaline Phosphatase: 103 IU/L (ref 44–121)
BUN/Creatinine Ratio: 16 (ref 10–24)
BUN: 15 mg/dL (ref 8–27)
Bilirubin Total: 1 mg/dL (ref 0.0–1.2)
CO2: 23 mmol/L (ref 20–29)
Calcium: 9.5 mg/dL (ref 8.6–10.2)
Chloride: 97 mmol/L (ref 96–106)
Creatinine, Ser: 0.96 mg/dL (ref 0.76–1.27)
Globulin, Total: 2.5 g/dL (ref 1.5–4.5)
Glucose: 112 mg/dL — ABNORMAL HIGH (ref 70–99)
Potassium: 3.6 mmol/L (ref 3.5–5.2)
Sodium: 136 mmol/L (ref 134–144)
Total Protein: 6.9 g/dL (ref 6.0–8.5)
eGFR: 78 mL/min/{1.73_m2} (ref >59)

## 2021-05-04 LAB — LIPASE: Lipase: 12 U/L — ABNORMAL LOW (ref 13–78)

## 2021-05-04 NOTE — Telephone Encounter (Signed)
-----   Message from Star Age, MD sent at 05/04/2021 12:04 PM EST ----- Please call patient or his wife regarding his blood work from yesterday, no obvious sign of inflammation or infection but he does have a mildly increased white cell count which can be indicative of an underlying smoldering or new infection.   I recommend that he follow-up with his primary care and other specialists as scheduled.

## 2021-05-04 NOTE — Telephone Encounter (Signed)
I called the pt's wife, [name] (on [place]) and advised that pt's labs from yesterday did not show any obvious signs of inflammation or infection, however his WBCs are slightly elevated which could indicate a smoldering or new infection. I advised, per Dr Rexene Alberts, for her to follow-up with pt's primary care for further evaluation and follow with other specialists as scheduled. I also advised the results would be sent to Dr [name].   Results sent to Dr [name].

## 2021-05-06 ENCOUNTER — Ambulatory Visit: Payer: HMO | Admitting: Neurology

## 2021-05-08 ENCOUNTER — Telehealth: Payer: Self-pay | Admitting: Neurology

## 2021-05-08 MED ORDER — QUETIAPINE FUMARATE 25 MG PO TABS: 50.0000 mg | ORAL_TABLET | Freq: Every day | ORAL | 1 refills | Status: DC

## 2021-05-08 NOTE — Telephone Encounter (Signed)
[name] , Andre [name], called at noon.   Parkinson , Dementia , COPD history of CHF, paroxysmal atrial fibrillation, is on Eliquis.  Review of medical records demonstrates the patient was discharged from hospital after being treated for CHF exacerbation on April 13, 2021 Patient of Dr Jannifer Franklin, now Dr Rexene Alberts.  Her husband has been sundowning and is very hard to control for her , the last 3-4 nights. They saw PCP, had blood work done, unspecific elevation in WBC, she was asked to increase fluid intake. Seroquel was prescribed ( how long ago?) at 25 mg nightly dose.  Her concern is as follows: that he leaves the house at midnight to search for a BOX in the garage, he wants to drive the pick-up, ( she hid the keys), he wants access to his guns , (which she has locked away).  I suggested to increase Seroquel to 50 mg and advance intake time to late afternoon, before he becomes agitated. I adjusted the dose in his EPIC file.    I also mentioned that dementia can certainly be exacerbated by UTI or dehydration.  She is exploring options how to care for him long -term: also possible hospitalization for respite care . Wife is still working and exhausted by care.  Could husband be admitted to [facility] to follow with geriatric psychiatry stay for medication adjustment?  She would like to be guided to how to proceed,  C Andre [name]

## 2021-05-10 ENCOUNTER — Telehealth: Payer: Self-pay | Admitting: Neurology

## 2021-05-10 NOTE — Telephone Encounter (Signed)
LVM for patient wife to call me back(checked DPR) regarding some updated information about the patient

## 2021-05-10 NOTE — Telephone Encounter (Signed)
Pt's wife, Emett Stapel would like a call from the nurse to discuss next step. Pt sleeping a lot, not coherent, recognizes wife, does not have a appetite.  Please call my cellphone [phone]

## 2021-05-10 NOTE — Telephone Encounter (Signed)
Nothing further needed, see detailed office note from recent encounter.  He was supposed to restart his Parkinson's medication and follow-up with his primary care as well as his sleep specialist.

## 2021-05-10 NOTE — Telephone Encounter (Signed)
I reviewed the phone conversation that patient's wife had with Dr. Brett Fairy on call.  I would like for the patient's wife to give [place] a feedback on the Seroquel, as Dr. Brett Fairy suggested increasing it to 50 mg.  I would like to make sure that he tolerates it.  Please ask patient's wife to give [place] an update in about a week.

## 2021-05-10 NOTE — Telephone Encounter (Addendum)
Spoke with the patient's wife.  She states his dementia is worse since he was seen by Dr. Rexene Alberts on January 16.  She states it was not displayed at the office visit.  The other night he wandered around in the garage until 1:30 AM.  He sleeps 15 hours a day.  She states he has not fallen in about a month.  He does have a primary care visit scheduled this Wednesday but she is going to call to see if he can be scheduled sooner.  I reminded her that his recent labs showed an increase in white blood cells which could be indicative of a new infection.  She understood.  She stated this morning he complained of some trouble breathing.  He does have "fluid on his lungs" and he was in the ER on 04/11/21.  The fire department checked him today and his vital signs were good and his oxygen was 98%.  She states he has complained of a headache daily for the last 4 to 5 days and Tylenol helps.  We discussed that if he develops any signs of neurological decline or change that she should call 911.  If he develops any weakness on one side, numbness/tingling, difficulty with vision or ambulating to call 911 for immediate assistance.  She states he has had some slurred speech for several weeks but states that it is just that it is not as clear as it used to be, but his mouth does not droop.  Also discussed that if he is dehydrated that could also contribute to headache.  I encouraged her to make sure he drinks water.  Patient's she is giving the patient 50 mg of Seroquel at night.  We discussed that it may take a few days to start noticing a difference.  We are going to stay in contact.  I let her know I will send a message over to Dr. Rexene Alberts and she is going to call primary care.  Also advised that there may need to be a conversation about his daily care as she is expressing concern that there is only so much she would be able to do.  Also advised her that if his safety or her safety is in question in the time to call 911 for evaluation  and transport to the hospital if needed.  She verbalized appreciation for the call.

## 2021-05-11 ENCOUNTER — Emergency Department: Payer: HMO

## 2021-05-11 ENCOUNTER — Other Ambulatory Visit: Payer: Self-pay

## 2021-05-11 ENCOUNTER — Inpatient Hospital Stay
Admission: EM | Admit: 2021-05-11 | Discharge: 2021-05-25 | DRG: 056 | Disposition: A | Discharge status: Home / Self Care | Payer: HMO | Attending: Family Medicine | Admitting: Family Medicine

## 2021-05-11 ENCOUNTER — Encounter: Payer: Self-pay | Admitting: Emergency Medicine

## 2021-05-11 ENCOUNTER — Ambulatory Visit: Payer: HMO | Admitting: Adult Health

## 2021-05-11 DIAGNOSIS — Z9989 Dependence on other enabling machines and devices: Secondary | ICD-10-CM

## 2021-05-11 DIAGNOSIS — A419 Sepsis, unspecified organism: Secondary | ICD-10-CM | POA: Diagnosis not present

## 2021-05-11 DIAGNOSIS — I4821 Permanent atrial fibrillation: Secondary | ICD-10-CM | POA: Diagnosis present

## 2021-05-11 DIAGNOSIS — I25119 Atherosclerotic heart disease of native coronary artery with unspecified angina pectoris: Secondary | ICD-10-CM | POA: Diagnosis present

## 2021-05-11 DIAGNOSIS — D696 Thrombocytopenia, unspecified: Secondary | ICD-10-CM | POA: Diagnosis present

## 2021-05-11 DIAGNOSIS — F0282 Dementia in other diseases classified elsewhere, unspecified severity, with psychotic disturbance: Secondary | ICD-10-CM | POA: Diagnosis not present

## 2021-05-11 DIAGNOSIS — F22 Delusional disorders: Secondary | ICD-10-CM | POA: Diagnosis not present

## 2021-05-11 DIAGNOSIS — Z7189 Other specified counseling: Secondary | ICD-10-CM | POA: Diagnosis not present

## 2021-05-11 DIAGNOSIS — I5030 Unspecified diastolic (congestive) heart failure: Secondary | ICD-10-CM | POA: Diagnosis present

## 2021-05-11 DIAGNOSIS — F0283 Dementia in other diseases classified elsewhere, unspecified severity, with mood disturbance: Secondary | ICD-10-CM | POA: Diagnosis present

## 2021-05-11 DIAGNOSIS — N400 Enlarged prostate without lower urinary tract symptoms: Secondary | ICD-10-CM | POA: Diagnosis not present

## 2021-05-11 DIAGNOSIS — Z7901 Long term (current) use of anticoagulants: Secondary | ICD-10-CM

## 2021-05-11 DIAGNOSIS — K219 Gastro-esophageal reflux disease without esophagitis: Secondary | ICD-10-CM | POA: Diagnosis present

## 2021-05-11 DIAGNOSIS — G319 Degenerative disease of nervous system, unspecified: Secondary | ICD-10-CM | POA: Diagnosis not present

## 2021-05-11 DIAGNOSIS — F05 Delirium due to known physiological condition: Secondary | ICD-10-CM | POA: Diagnosis not present

## 2021-05-11 DIAGNOSIS — G4733 Obstructive sleep apnea (adult) (pediatric): Secondary | ICD-10-CM

## 2021-05-11 DIAGNOSIS — E669 Obesity, unspecified: Secondary | ICD-10-CM | POA: Diagnosis present

## 2021-05-11 DIAGNOSIS — Z20822 Contact with and (suspected) exposure to covid-19: Secondary | ICD-10-CM | POA: Diagnosis not present

## 2021-05-11 DIAGNOSIS — M109 Gout, unspecified: Secondary | ICD-10-CM | POA: Diagnosis present

## 2021-05-11 DIAGNOSIS — Z951 Presence of aortocoronary bypass graft: Secondary | ICD-10-CM

## 2021-05-11 DIAGNOSIS — F32A Depression, unspecified: Secondary | ICD-10-CM | POA: Diagnosis present

## 2021-05-11 DIAGNOSIS — J8410 Pulmonary fibrosis, unspecified: Secondary | ICD-10-CM | POA: Diagnosis present

## 2021-05-11 DIAGNOSIS — E872 Acidosis, unspecified: Secondary | ICD-10-CM | POA: Diagnosis not present

## 2021-05-11 DIAGNOSIS — Z87891 Personal history of nicotine dependence: Secondary | ICD-10-CM

## 2021-05-11 DIAGNOSIS — R17 Unspecified jaundice: Secondary | ICD-10-CM | POA: Diagnosis present

## 2021-05-11 DIAGNOSIS — Z79899 Other long term (current) drug therapy: Secondary | ICD-10-CM

## 2021-05-11 DIAGNOSIS — Z6836 Body mass index (BMI) 36.0-36.9, adult: Secondary | ICD-10-CM

## 2021-05-11 DIAGNOSIS — E785 Hyperlipidemia, unspecified: Secondary | ICD-10-CM | POA: Diagnosis present

## 2021-05-11 DIAGNOSIS — I10 Essential (primary) hypertension: Secondary | ICD-10-CM | POA: Diagnosis not present

## 2021-05-11 DIAGNOSIS — Z66 Do not resuscitate: Secondary | ICD-10-CM | POA: Diagnosis present

## 2021-05-11 DIAGNOSIS — I4892 Unspecified atrial flutter: Secondary | ICD-10-CM | POA: Diagnosis not present

## 2021-05-11 DIAGNOSIS — R4182 Altered mental status, unspecified: Secondary | ICD-10-CM

## 2021-05-11 DIAGNOSIS — Z79890 Hormone replacement therapy (postmenopausal): Secondary | ICD-10-CM

## 2021-05-11 DIAGNOSIS — R410 Disorientation, unspecified: Secondary | ICD-10-CM

## 2021-05-11 DIAGNOSIS — G20 Parkinson's disease: Secondary | ICD-10-CM | POA: Diagnosis not present

## 2021-05-11 DIAGNOSIS — G9341 Metabolic encephalopathy: Secondary | ICD-10-CM

## 2021-05-11 DIAGNOSIS — I5042 Chronic combined systolic (congestive) and diastolic (congestive) heart failure: Secondary | ICD-10-CM | POA: Diagnosis present

## 2021-05-11 DIAGNOSIS — F0284 Dementia in other diseases classified elsewhere, unspecified severity, with anxiety: Secondary | ICD-10-CM | POA: Diagnosis present

## 2021-05-11 DIAGNOSIS — F0280 Dementia in other diseases classified elsewhere without behavioral disturbance: Secondary | ICD-10-CM | POA: Diagnosis present

## 2021-05-11 DIAGNOSIS — I517 Cardiomegaly: Secondary | ICD-10-CM | POA: Diagnosis not present

## 2021-05-11 DIAGNOSIS — I110 Hypertensive heart disease with heart failure: Secondary | ICD-10-CM | POA: Diagnosis present

## 2021-05-11 DIAGNOSIS — Z515 Encounter for palliative care: Secondary | ICD-10-CM

## 2021-05-11 DIAGNOSIS — E039 Hypothyroidism, unspecified: Secondary | ICD-10-CM | POA: Diagnosis not present

## 2021-05-11 DIAGNOSIS — Z85828 Personal history of other malignant neoplasm of skin: Secondary | ICD-10-CM

## 2021-05-11 DIAGNOSIS — Z9049 Acquired absence of other specified parts of digestive tract: Secondary | ICD-10-CM

## 2021-05-11 DIAGNOSIS — R7989 Other specified abnormal findings of blood chemistry: Secondary | ICD-10-CM | POA: Diagnosis not present

## 2021-05-11 DIAGNOSIS — Z86711 Personal history of pulmonary embolism: Secondary | ICD-10-CM

## 2021-05-11 DIAGNOSIS — R5383 Other fatigue: Secondary | ICD-10-CM | POA: Diagnosis not present

## 2021-05-11 DIAGNOSIS — Z888 Allergy status to other drugs, medicaments and biological substances status: Secondary | ICD-10-CM

## 2021-05-11 DIAGNOSIS — I480 Paroxysmal atrial fibrillation: Secondary | ICD-10-CM | POA: Diagnosis present

## 2021-05-11 DIAGNOSIS — E876 Hypokalemia: Secondary | ICD-10-CM | POA: Diagnosis present

## 2021-05-11 DIAGNOSIS — R443 Hallucinations, unspecified: Secondary | ICD-10-CM | POA: Diagnosis not present

## 2021-05-11 DIAGNOSIS — I5032 Chronic diastolic (congestive) heart failure: Secondary | ICD-10-CM | POA: Diagnosis not present

## 2021-05-11 LAB — COMPREHENSIVE METABOLIC PANEL
ALT: 7 U/L (ref 0–44)
AST: 16 U/L (ref 15–41)
Albumin: 3.7 g/dL (ref 3.5–5.0)
Alkaline Phosphatase: 85 U/L (ref 38–126)
Anion gap: 11 (ref 5–15)
BUN: 17 mg/dL (ref 8–23)
CO2: 29 mmol/L (ref 22–32)
Calcium: 9.3 mg/dL (ref 8.9–10.3)
Chloride: 93 mmol/L — ABNORMAL LOW (ref 98–111)
Creatinine, Ser: 0.94 mg/dL (ref 0.61–1.24)
GFR, Estimated: >60 mL/min (ref >60)
Glucose, Bld: 112 mg/dL — ABNORMAL HIGH (ref 70–99)
Potassium: 3.6 mmol/L (ref 3.5–5.1)
Sodium: 133 mmol/L — ABNORMAL LOW (ref 135–145)
Total Bilirubin: 1.2 mg/dL (ref 0.3–1.2)
Total Protein: 6.9 g/dL (ref 6.5–8.1)

## 2021-05-11 LAB — CBC WITH DIFFERENTIAL/PLATELET
Abs Immature Granulocytes: 0.04 10*3/uL (ref 0.00–0.07)
Basophils Absolute: 0 10*3/uL (ref 0.0–0.1)
Basophils Relative: 0 %
Eosinophils Absolute: 0.1 10*3/uL (ref 0.0–0.5)
Eosinophils Relative: 1 %
HCT: 46.5 % (ref 39.0–52.0)
Hemoglobin: 15.5 g/dL (ref 13.0–17.0)
Immature Granulocytes: 0 %
Lymphocytes Relative: 23 %
Lymphs Abs: 2.2 10*3/uL (ref 0.7–4.0)
MCH: 30.6 pg (ref 26.0–34.0)
MCHC: 33.3 g/dL (ref 30.0–36.0)
MCV: 91.7 fL (ref 80.0–100.0)
Monocytes Absolute: 1 10*3/uL (ref 0.1–1.0)
Monocytes Relative: 11 %
Neutro Abs: 6.2 10*3/uL (ref 1.7–7.7)
Neutrophils Relative %: 65 %
Platelets: 142 10*3/uL — ABNORMAL LOW (ref 150–400)
RBC: 5.07 MIL/uL (ref 4.22–5.81)
RDW: 13.5 % (ref 11.5–15.5)
WBC: 9.7 10*3/uL (ref 4.0–10.5)
nRBC: 0 % (ref 0.0–0.2)

## 2021-05-11 LAB — TROPONIN I (HIGH SENSITIVITY)
Troponin I (High Sensitivity): 9 ng/L (ref <18)
Troponin I (High Sensitivity): 7 ng/L (ref <18)

## 2021-05-11 LAB — URINALYSIS, ROUTINE W REFLEX MICROSCOPIC
Bilirubin Urine: NEGATIVE
Glucose, UA: NEGATIVE mg/dL
Hgb urine dipstick: NEGATIVE
Ketones, ur: NEGATIVE mg/dL
Nitrite: NEGATIVE
Protein, ur: NEGATIVE mg/dL
Specific Gravity, Urine: 1.01 (ref 1.005–1.030)
pH: 7 (ref 5.0–8.0)

## 2021-05-11 LAB — BLOOD GAS, VENOUS
Acid-Base Excess: 6.7 mmol/L — ABNORMAL HIGH (ref 0.0–2.0)
Bicarbonate: 32.5 mmol/L — ABNORMAL HIGH (ref 20.0–28.0)
O2 Saturation: 31.2 %
Patient temperature: 37
pCO2, Ven: 49 mmHg (ref 44.0–60.0)
pH, Ven: 7.43 (ref 7.250–7.430)
pO2, Ven: <31 mmHg — CL (ref 32.0–45.0)

## 2021-05-11 LAB — URINALYSIS, MICROSCOPIC (REFLEX): Squamous Epithelial / HPF: NONE SEEN (ref 0–5)

## 2021-05-11 LAB — URINE DRUG SCREEN, QUALITATIVE (ARMC ONLY)
Amphetamines, Ur Screen: NOT DETECTED
Barbiturates, Ur Screen: NOT DETECTED
Benzodiazepine, Ur Scrn: NOT DETECTED
Cannabinoid 50 Ng, Ur ~~LOC~~: NOT DETECTED
Cocaine Metabolite,Ur ~~LOC~~: NOT DETECTED
MDMA (Ecstasy)Ur Screen: NOT DETECTED
Methadone Scn, Ur: NOT DETECTED
Opiate, Ur Screen: NOT DETECTED
Phencyclidine (PCP) Ur S: NOT DETECTED
Tricyclic, Ur Screen: NOT DETECTED

## 2021-05-11 LAB — LACTIC ACID, PLASMA
Lactic Acid, Venous: 2.8 mmol/L (ref 0.5–1.9)
Lactic Acid, Venous: 1.2 mmol/L (ref 0.5–1.9)

## 2021-05-11 LAB — RESP PANEL BY RT-PCR (FLU A&B, COVID) ARPGX2
Influenza A by PCR: NEGATIVE
Influenza B by PCR: NEGATIVE
SARS Coronavirus 2 by RT PCR: NEGATIVE

## 2021-05-11 LAB — LIPASE, BLOOD: Lipase: 24 U/L (ref 11–51)

## 2021-05-11 LAB — ACETAMINOPHEN LEVEL: Acetaminophen (Tylenol), Serum: <10 ug/mL — ABNORMAL LOW (ref 10–30)

## 2021-05-11 LAB — PROCALCITONIN: Procalcitonin: <0.1 ng/mL

## 2021-05-11 LAB — ETHANOL: Alcohol, Ethyl (B): <10 mg/dL (ref <10)

## 2021-05-11 LAB — D-DIMER, QUANTITATIVE: D-Dimer, Quant: 0.55 ug/mL-FEU — ABNORMAL HIGH (ref 0.00–0.50)

## 2021-05-11 MED ORDER — ENTACAPONE 200 MG PO TABS
200.0000 mg | ORAL_TABLET | Freq: Three times a day (TID) | ORAL | Status: DC
  Administered 2021-05-11 – 2021-05-25 (×39): 200 mg via ORAL
  Filled 2021-05-11 (×45): qty 1

## 2021-05-11 MED ORDER — ZIPRASIDONE MESYLATE 20 MG IM SOLR
20.0000 mg | Freq: Two times a day (BID) | INTRAMUSCULAR | Status: DC | PRN
  Administered 2021-05-13 – 2021-05-23 (×3): 20 mg via INTRAMUSCULAR
  Filled 2021-05-11 (×5): qty 20

## 2021-05-11 MED ORDER — LORAZEPAM 2 MG/ML IJ SOLN
2.0000 mg | Freq: Once | INTRAMUSCULAR | Status: AC
  Administered 2021-05-11: 14:00:00 2 mg via INTRAVENOUS

## 2021-05-11 MED ORDER — SODIUM CHLORIDE 0.9 % IV BOLUS (SEPSIS)
1000.0000 mL | Freq: Once | INTRAVENOUS | Status: AC
  Administered 2021-05-11: 05:00:00 1000 mL via INTRAVENOUS

## 2021-05-11 MED ORDER — VANCOMYCIN HCL IN DEXTROSE 1-5 GM/200ML-% IV SOLN: 1000.0000 mg | Freq: Once | INTRAVENOUS | Status: DC

## 2021-05-11 MED ORDER — SODIUM CHLORIDE 0.9 % IV SOLN
2.0000 g | Freq: Once | INTRAVENOUS | Status: AC
  Administered 2021-05-11: 04:00:00 2 g via INTRAVENOUS
  Filled 2021-05-11: qty 2

## 2021-05-11 MED ORDER — METRONIDAZOLE 500 MG/100ML IV SOLN
500.0000 mg | Freq: Once | INTRAVENOUS | Status: AC
  Administered 2021-05-11: 05:00:00 500 mg via INTRAVENOUS
  Filled 2021-05-11: qty 100

## 2021-05-11 MED ORDER — CARBIDOPA-LEVODOPA 25-250 MG PO TABS
0.5000 | ORAL_TABLET | Freq: Four times a day (QID) | ORAL | Status: DC
  Administered 2021-05-11 – 2021-05-25 (×51): 0.5 via ORAL
  Filled 2021-05-11 (×60): qty 1

## 2021-05-11 MED ORDER — BUPROPION HCL ER (XL) 150 MG PO TB24
300.0000 mg | ORAL_TABLET | Freq: Every day | ORAL | Status: DC
  Administered 2021-05-11: 10:00:00 300 mg via ORAL
  Filled 2021-05-11: qty 2

## 2021-05-11 MED ORDER — APIXABAN 5 MG PO TABS
5.0000 mg | ORAL_TABLET | Freq: Two times a day (BID) | ORAL | Status: DC
  Administered 2021-05-11 – 2021-05-25 (×26): 5 mg via ORAL
  Filled 2021-05-11 (×25): qty 1

## 2021-05-11 MED ORDER — TAMSULOSIN HCL 0.4 MG PO CAPS
0.4000 mg | ORAL_CAPSULE | Freq: Every day | ORAL | Status: DC
  Administered 2021-05-11 – 2021-05-25 (×14): 0.4 mg via ORAL
  Filled 2021-05-11 (×14): qty 1

## 2021-05-11 MED ORDER — LORAZEPAM 2 MG/ML IJ SOLN
2.0000 mg | Freq: Four times a day (QID) | INTRAMUSCULAR | Status: DC | PRN
  Administered 2021-05-13 – 2021-05-14 (×2): 2 mg via INTRAVENOUS
  Filled 2021-05-11 (×2): qty 1

## 2021-05-11 MED ORDER — VANCOMYCIN HCL 2000 MG/400ML IV SOLN
2000.0000 mg | Freq: Once | INTRAVENOUS | Status: AC
  Administered 2021-05-11: 07:00:00 2000 mg via INTRAVENOUS
  Filled 2021-05-11 (×2): qty 400

## 2021-05-11 MED ORDER — LEVOTHYROXINE SODIUM 50 MCG PO TABS
50.0000 ug | ORAL_TABLET | Freq: Every day | ORAL | Status: DC
  Administered 2021-05-11 – 2021-05-25 (×11): 50 ug via ORAL
  Filled 2021-05-11 (×12): qty 1

## 2021-05-11 MED ORDER — LORAZEPAM 2 MG/ML IJ SOLN
INTRAMUSCULAR | Status: AC
  Filled 2021-05-11: qty 1

## 2021-05-11 MED ORDER — ALBUTEROL SULFATE (2.5 MG/3ML) 0.083% IN NEBU
2.5000 mg | INHALATION_SOLUTION | Freq: Four times a day (QID) | RESPIRATORY_TRACT | Status: DC | PRN
  Administered 2021-05-18: 13:00:00 2.5 mg via RESPIRATORY_TRACT
  Filled 2021-05-11: qty 3

## 2021-05-11 MED ORDER — CARVEDILOL 6.25 MG PO TABS
6.2500 mg | ORAL_TABLET | Freq: Two times a day (BID) | ORAL | Status: DC
  Administered 2021-05-12 – 2021-05-25 (×25): 6.25 mg via ORAL
  Filled 2021-05-11 (×24): qty 1

## 2021-05-11 MED ORDER — QUETIAPINE FUMARATE 25 MG PO TABS
25.0000 mg | ORAL_TABLET | Freq: Every day | ORAL | Status: DC
  Administered 2021-05-12 – 2021-05-19 (×6): 25 mg via ORAL
  Filled 2021-05-11 (×5): qty 1

## 2021-05-11 MED ORDER — HALOPERIDOL LACTATE 5 MG/ML IJ SOLN
2.0000 mg | Freq: Four times a day (QID) | INTRAMUSCULAR | Status: DC | PRN
  Administered 2021-05-13 – 2021-05-16 (×3): 2 mg via INTRAVENOUS
  Filled 2021-05-11 (×5): qty 1

## 2021-05-11 MED ORDER — ZIPRASIDONE MESYLATE 20 MG IM SOLR
20.0000 mg | INTRAMUSCULAR | Status: AC
  Administered 2021-05-11: 14:00:00 20 mg via INTRAMUSCULAR
  Filled 2021-05-11: qty 20

## 2021-05-11 MED ORDER — SERTRALINE HCL 50 MG PO TABS
50.0000 mg | ORAL_TABLET | Freq: Every day | ORAL | Status: DC
  Administered 2021-05-11 – 2021-05-25 (×14): 50 mg via ORAL
  Filled 2021-05-11 (×14): qty 1

## 2021-05-11 MED ORDER — SODIUM CHLORIDE 0.9 % IV BOLUS (SEPSIS): 1000.0000 mL | Freq: Once | INTRAVENOUS | Status: DC

## 2021-05-11 MED ORDER — ALBUTEROL SULFATE HFA 108 (90 BASE) MCG/ACT IN AERS: 2.0000 | INHALATION_SPRAY | Freq: Four times a day (QID) | RESPIRATORY_TRACT | Status: DC | PRN

## 2021-05-11 MED ORDER — HALOPERIDOL LACTATE 5 MG/ML IJ SOLN: 2.0000 mg | INTRAMUSCULAR | Status: DC | PRN

## 2021-05-11 MED ORDER — SODIUM CHLORIDE 0.9 % IV BOLUS
1000.0000 mL | Freq: Once | INTRAVENOUS | Status: AC
  Administered 2021-05-11: 04:00:00 1000 mL via INTRAVENOUS

## 2021-05-11 MED ORDER — HALOPERIDOL LACTATE 5 MG/ML IJ SOLN
1.0000 mg | Freq: Four times a day (QID) | INTRAMUSCULAR | Status: AC | PRN
  Administered 2021-05-11: 12:00:00 1 mg via INTRAVENOUS
  Filled 2021-05-11: qty 1

## 2021-05-11 MED ORDER — HALOPERIDOL LACTATE 5 MG/ML IJ SOLN
5.0000 mg | Freq: Once | INTRAMUSCULAR | Status: AC
  Administered 2021-05-11: 04:00:00 5 mg via INTRAVENOUS
  Filled 2021-05-11: qty 1

## 2021-05-11 NOTE — Progress Notes (Signed)
Was notified patient was having some behavior issues and security was present in room. On arrival, unit staff, ICU CN in room. MD had been notified. No issues with vital signs.

## 2021-05-11 NOTE — Consult Note (Signed)
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        Attention and Perception: He is inattentive.        Speech: He is noncommunicative.        Cognition and Memory: Cognition is impaired.   Review of Systems  Unable to perform ROS: Mental status change  Blood pressure 138/80, pulse 89, temperature 98.1 F (36.7 C), resp. rate 18, height 5\' 6"  (1.676 m), weight 101.6 kg, SpO2 97 %.  Body mass index is 36.15 kg/m.  Treatment Plan Summary: Medication management and Plan chart reviewed.  Spoke with hospitalist.  Spoke with nurses on the ward.  Nursing staff suggests involuntary commitment be filed to assist with the practicalities of managing the patient.  This encourages more aggressive medication on the part of nursing staff and also insures a one-to-one sitter.  Orders changed to haloperidol 2 mg every 4 hours as needed.  Probably not likely to need anything like this but I think it is better to start off with a more frequent order.  EKG examined.  QT interval still under 500 which is reassuring.  Discontinued bupropion to avoid any stimulation from that medicine.  We will follow as needed.  Disposition:  see note  Alethia Berthold, MD 05/11/2021 3:08 PM

## 2021-05-11 NOTE — Telephone Encounter (Signed)
Noted, thank you for the notification.

## 2021-05-11 NOTE — BH Assessment (Signed)
PLACED  PT  UNDER  IVC  PAPERS  PER  DR  CLAPACS MD  INFORMED  JOE  RN Bing Plume NURSE ON 1C

## 2021-05-11 NOTE — Telephone Encounter (Signed)
I tried to call the pt's wife but the call would not go through. Will try again later. I see here pt is now in the hospital for AMS, sepsis.

## 2021-05-11 NOTE — ED Notes (Signed)
Date and time results received: 05/11/21 45625  Test: Lactic Critical Value: 2.8  Name of Provider Notified: Beather Arbour  Orders Received? Or Actions Taken?:  n/a

## 2021-05-11 NOTE — Progress Notes (Signed)
Ginger Blue answered Spiritual Consult. Pt asleep when [facility] arrived; nurse said PT was getting first real sleep in a long time so [facility] will recommend to [facility] to visit.

## 2021-05-11 NOTE — Progress Notes (Signed)
PHARMACY -  BRIEF ANTIBIOTIC NOTE   Pharmacy has received consult(s) for Cefepime and Vancomycin from an ED provider.  The patient's profile has been reviewed for ht/wt/allergies/indication/available labs.    One time order(s) placed for Cefepime 2 gm and Vancomycin 2 gm per pt wt 101.6 kg  Further antibiotics/pharmacy consults should be ordered by admitting physician if indicated.                       Thank you, Renda Rolls, PharmD, [facility] 05/11/2021 3:48 AM

## 2021-05-11 NOTE — Sepsis Progress Note (Signed)
Monitoring for the code sepsis protocol. °

## 2021-05-11 NOTE — Telephone Encounter (Signed)
I was told by phone staff that pt's wife called back a little bit ago and then hung up.

## 2021-05-11 NOTE — ED Notes (Signed)
Pt pulled out IV and urinated on himself, climbing out of bed, pt changed, bed and linens changed, new IV placed, male wick placed on pt, EDP aware

## 2021-05-11 NOTE — Telephone Encounter (Signed)
[name] called, asked you to call her back at [phone]

## 2021-05-11 NOTE — ED Notes (Signed)
This RN updated this pt's wife at bedside on this pt's condition.
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
## 2021-05-11 NOTE — ED Notes (Signed)
EDP at bedside for MSE.  

## 2021-05-11 NOTE — Progress Notes (Signed)
CODE SEPSIS - PHARMACY COMMUNICATION  **Broad Spectrum Antibiotics should be administered within 1 hour of Sepsis diagnosis**  Time Code Sepsis Called/Page Received: 4970  Antibiotics Ordered: Cefepime, Vancomycin, Flagyl  Time of 1st antibiotic administration: 0424  Renda Rolls, PharmD, [facility] 05/11/2021 3:49 AM
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
## 2021-05-12 ENCOUNTER — Inpatient Hospital Stay: Payer: HMO

## 2021-05-12 ENCOUNTER — Ambulatory Visit: Payer: HMO | Admitting: Adult Health

## 2021-05-12 ENCOUNTER — Ambulatory Visit: Payer: HMO | Admitting: Dermatology

## 2021-05-12 DIAGNOSIS — G4733 Obstructive sleep apnea (adult) (pediatric): Secondary | ICD-10-CM

## 2021-05-12 DIAGNOSIS — E872 Acidosis, unspecified: Secondary | ICD-10-CM

## 2021-05-12 DIAGNOSIS — I25119 Atherosclerotic heart disease of native coronary artery with unspecified angina pectoris: Secondary | ICD-10-CM

## 2021-05-12 DIAGNOSIS — I5032 Chronic diastolic (congestive) heart failure: Secondary | ICD-10-CM

## 2021-05-12 DIAGNOSIS — Z7189 Other specified counseling: Secondary | ICD-10-CM

## 2021-05-12 DIAGNOSIS — I480 Paroxysmal atrial fibrillation: Secondary | ICD-10-CM

## 2021-05-12 DIAGNOSIS — F05 Delirium due to known physiological condition: Secondary | ICD-10-CM

## 2021-05-12 DIAGNOSIS — Z515 Encounter for palliative care: Secondary | ICD-10-CM

## 2021-05-12 LAB — BASIC METABOLIC PANEL
Anion gap: 8 (ref 5–15)
BUN: 12 mg/dL (ref 8–23)
CO2: 26 mmol/L (ref 22–32)
Calcium: 8.7 mg/dL — ABNORMAL LOW (ref 8.9–10.3)
Chloride: 105 mmol/L (ref 98–111)
Creatinine, Ser: 0.77 mg/dL (ref 0.61–1.24)
GFR, Estimated: >60 mL/min (ref >60)
Glucose, Bld: 93 mg/dL (ref 70–99)
Potassium: 3 mmol/L — ABNORMAL LOW (ref 3.5–5.1)
Sodium: 139 mmol/L (ref 135–145)

## 2021-05-12 LAB — CBC
HCT: 43.6 % (ref 39.0–52.0)
Hemoglobin: 14.3 g/dL (ref 13.0–17.0)
MCH: 30.2 pg (ref 26.0–34.0)
MCHC: 32.8 g/dL (ref 30.0–36.0)
MCV: 92.2 fL (ref 80.0–100.0)
Platelets: 130 10*3/uL — ABNORMAL LOW (ref 150–400)
RBC: 4.73 MIL/uL (ref 4.22–5.81)
RDW: 13.8 % (ref 11.5–15.5)
WBC: 7.2 10*3/uL (ref 4.0–10.5)
nRBC: 0 % (ref 0.0–0.2)

## 2021-05-12 LAB — URINE CULTURE: Culture: NO GROWTH

## 2021-05-12 LAB — PHOSPHORUS: Phosphorus: 3.1 mg/dL (ref 2.5–4.6)

## 2021-05-12 LAB — GLUCOSE, CAPILLARY: Glucose-Capillary: 133 mg/dL — ABNORMAL HIGH (ref 70–99)

## 2021-05-12 LAB — MAGNESIUM: Magnesium: 2.1 mg/dL (ref 1.7–2.4)

## 2021-05-12 MED ORDER — IOHEXOL 350 MG/ML SOLN
75.0000 mL | Freq: Once | INTRAVENOUS | Status: AC | PRN
  Administered 2021-05-12: 19:00:00 75 mL via INTRAVENOUS

## 2021-05-12 MED ORDER — ADULT MULTIVITAMIN W/MINERALS CH
1.0000 | ORAL_TABLET | Freq: Every day | ORAL | Status: DC
  Administered 2021-05-12 – 2021-05-25 (×13): 1 via ORAL
  Filled 2021-05-12 (×13): qty 1

## 2021-05-12 MED ORDER — POTASSIUM CHLORIDE 10 MEQ/100ML IV SOLN
10.0000 meq | INTRAVENOUS | Status: AC
  Administered 2021-05-12 (×4): 10 meq via INTRAVENOUS
  Filled 2021-05-12: qty 100

## 2021-05-12 NOTE — Plan of Care (Signed)
  Problem: Pain Managment: Goal: General experience of comfort will improve Outcome: Progressing   Problem: Safety: Goal: Ability to remain free from injury will improve Outcome: Progressing   

## 2021-05-12 NOTE — Progress Notes (Signed)
Chaplain On-Call responded to Spiritual Care Consult Order: "patient requests prayer".  Chaplain met a Sitter at bedside, and I attempted to communicate with the patient.  The patient was lethargic, and he attempted to speak, but I was unable to understand any of his words. Since the patient was not able to discuss his request for prayer with me, I prayed silently outside the room, and I will refer the patient's request to later Chaplains for additional support if the patient can be understood.  Chaplain Remo Lipps., [facility]

## 2021-05-12 NOTE — Progress Notes (Signed)
DNR bracelet placed on patient 
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
## 2021-05-12 NOTE — Progress Notes (Signed)
Patient drowsy and lethargic this morning, but was able to follow directions to take PO medications whole with applesauce, a couple at a time with encouragement. Patient awake enough to states he wanted something to eat. I will notify MD.

## 2021-05-12 NOTE — Progress Notes (Addendum)
Patient very lethargic this shift, not able to give scheduled meds due to not alert enough to swallow. Vital signs stable, will continue to monitor.

## 2021-05-12 NOTE — Progress Notes (Signed)
SLP Cancellation Note  Patient Details Name: Andre [name] MRN: [id number] DOB: Dec 02, 1935   Cancelled treatment:       Reason Eval/Treat Not Completed: Fatigue/lethargy limiting ability to participate   Per chart, pt stated that he was hungry, however he continues to be lethargic d/t sedating medicines required to keep him safe during the night.   Per his nurse, [name]'s report he consumed his pills in applesauce without any issue this morning. Given that pt doesn't have any history of dysphagia/aspiration or respiratory compromise, plan created for pt's nurse to administer the [place] when pt demonstrates enough alertness for safe PO intake.   ST will follow pt's chart for any issues and nursing will contact ST if pt fails [place].   Andre [name] M.S., CCC-SLP, [place] Office [phone]    Andre [name] 05/12/2021, 1:30 PM
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
## 2021-05-12 NOTE — Evaluation (Addendum)
Physical Therapy Evaluation Patient Details Name: Andre [name] MRN: [id number] DOB: 09-20-1935 Today's Date: 05/12/2021  History of Present Illness  Pt admitted for delirium. HIstory includes Parkinson's dx, CAD, and HTN. Pt currently has sitter at bedside.  Clinical Impression  Pt is a pleasant 86 year old male who was admitted for delirium. Pt performs bed mobility with min assist, transfers with cga, and ambulation with cga and RW. Inconsistent ability to follow commands, however per staff, cognition appears to be slowly improving. Pt confused thinking he is in doctor office. Pt demonstrates deficits with strength/balance/mobility. Pt is primarily limited by cognition at this time and would likely do better in familiar environment with family. O2 sats at 97% with exertion on RA. Would benefit from skilled PT to address above deficits and promote optimal return to PLOF. Recommend transition to [place] upon discharge from acute hospitalization.      Recommendations for follow up therapy are one component of a multi-disciplinary discharge planning process, led by the attending physician.  Recommendations may be updated based on patient status, additional functional criteria and insurance authorization.  Follow Up Recommendations Home health PT    Assistance Recommended at Discharge Frequent or constant Supervision/Assistance  Patient can return home with the following  A little help with walking and/or transfers;A little help with bathing/dressing/bathroom;Help with stairs or ramp for entrance    Equipment Recommendations  (TBD)  Recommendations for Other Services       Functional Status Assessment Patient has had a recent decline in their functional status and demonstrates the ability to make significant improvements in function in a reasonable and predictable amount of time.     Precautions / Restrictions Precautions Precautions: Fall Restrictions Weight Bearing Restrictions: No       Mobility  Bed Mobility Overal bed mobility: Needs Assistance Bed Mobility: Supine to Sit     Supine to sit: Min assist     General bed mobility comments: follows commands inconsistently. Needs assist for B LE. Once seated, able to sit with upright posture.    Transfers Overall transfer level: Needs assistance Equipment used: Rolling walker (2 wheels) Transfers: Sit to/from Stand Sit to Stand: Min guard           General transfer comment: safe technique with ability to follow commands, although inconsistent. Once standing, slightly increased sway noted    Ambulation/Gait Ambulation/Gait assistance: Min guard Gait Distance (Feet): 50 Feet Assistive device: Rolling walker (2 wheels) Gait Pattern/deviations: Step-through pattern       General Gait Details: ambulated around room and in bathroom. Used RW. Needs several instances for redirection as pt becomes sidetracked easily.  Stairs            Wheelchair Mobility    Modified Rankin (Stroke Patients Only)       Balance Overall balance assessment: Needs assistance Sitting-balance support: Feet supported Sitting balance-Leahy Scale: Good     Standing balance support: Bilateral upper extremity supported Standing balance-Leahy Scale: Fair                               Pertinent Vitals/Pain Pain Assessment Pain Assessment: No/denies pain    Home Living Family/patient expects to be discharged to:: Private residence Living Arrangements: Spouse/significant other Available Help at Discharge: Family;Available 24 hours/day Type of Home: House Home Access: Stairs to enter Entrance Stairs-Rails: Can reach both;Left;Right Entrance Stairs-Number of Steps: 2   Home Layout: One level Home Equipment: Rolling  Walker (2 wheels);Cane - single point Additional Comments: pt is poor historian, home set up obtained from previous chart    Prior Function Prior Level of Function : Independent/Modified  Independent             Mobility Comments: per last PT note, was ambulatory with RW. Pt is poor historian and unable to give accurate history       Hand Dominance        Extremity/Trunk Assessment   Upper Extremity Assessment Upper Extremity Assessment: Overall WFL for tasks assessed    Lower Extremity Assessment Lower Extremity Assessment: Generalized weakness (B LE grossly 4+/5)       Communication   Communication: Expressive difficulties;HOH  Cognition Arousal/Alertness: Awake/alert Behavior During Therapy: WFL for tasks assessed/performed Overall Cognitive Status: Impaired/Different from baseline                                 General Comments: is confused to place and situation. Poor historian, reports he is having hallucinations, however states he knows they are hallucinations and is trying to become more oriented. Per CNA, pt is becoming more oriented as day progressess        General Comments      Exercises Other Exercises Other Exercises: upon getting OOB, pt began having BM. Able to ambulate to bathroom and using toliet for BM. Then needs min assist for hygiene and able to ambulate around bed to recliner.   Assessment/Plan    PT Assessment Patient needs continued PT services  PT Problem List Decreased strength;Decreased activity tolerance;Decreased balance;Decreased mobility;Decreased cognition;Decreased safety awareness       PT Treatment Interventions Gait training;Therapeutic exercise;Balance training;DME instruction    PT Goals (Current goals can be found in the Care Plan section)  Acute Rehab PT Goals Patient Stated Goal: unable to state PT Goal Formulation: Patient unable to participate in goal setting Time For Goal Achievement: 05/26/21 Potential to Achieve Goals: Good    Frequency Min 2X/week     Co-evaluation               AM-PAC PT "6 Clicks" Mobility  Outcome Measure Help needed turning from your back to  your side while in a flat bed without using bedrails?: A Little Help needed moving from lying on your back to sitting on the side of a flat bed without using bedrails?: A Little Help needed moving to and from a bed to a chair (including a wheelchair)?: A Little Help needed standing up from a chair using your arms (e.g., wheelchair or bedside chair)?: A Little Help needed to walk in hospital room?: A Little Help needed climbing 3-5 steps with a railing? : A Lot 6 Click Score: 17    End of Session Equipment Utilized During Treatment: Gait belt Activity Tolerance: Patient tolerated treatment well Patient left: in chair (with sitter at bedside) Nurse Communication: Mobility status PT Visit Diagnosis: Unsteadiness on feet (R26.81);Muscle weakness (generalized) (M62.81);Difficulty in walking, not elsewhere classified (R26.2)    Time: 2956-2130 PT Time Calculation (min) (ACUTE ONLY): 24 min   Charges:   PT Evaluation $PT Eval Low Complexity: 1 Low PT Treatments $Therapeutic Activity: 8-22 mins        Greggory Stallion, PT, DPT, GCS [phone]   Andre [name] 05/12/2021, 4:44 PM

## 2021-05-13 ENCOUNTER — Encounter: Payer: Self-pay | Admitting: Internal Medicine

## 2021-05-13 DIAGNOSIS — I10 Essential (primary) hypertension: Secondary | ICD-10-CM

## 2021-05-13 DIAGNOSIS — N400 Enlarged prostate without lower urinary tract symptoms: Secondary | ICD-10-CM

## 2021-05-13 LAB — CBC WITH DIFFERENTIAL/PLATELET
Abs Immature Granulocytes: 0.03 10*3/uL (ref 0.00–0.07)
Basophils Absolute: 0 10*3/uL (ref 0.0–0.1)
Basophils Relative: 0 %
Eosinophils Absolute: 0.1 10*3/uL (ref 0.0–0.5)
Eosinophils Relative: 2 %
HCT: 42.6 % (ref 39.0–52.0)
Hemoglobin: 14.1 g/dL (ref 13.0–17.0)
Immature Granulocytes: 0 %
Lymphocytes Relative: 20 %
Lymphs Abs: 1.6 10*3/uL (ref 0.7–4.0)
MCH: 30.1 pg (ref 26.0–34.0)
MCHC: 33.1 g/dL (ref 30.0–36.0)
MCV: 90.8 fL (ref 80.0–100.0)
Monocytes Absolute: 0.8 10*3/uL (ref 0.1–1.0)
Monocytes Relative: 10 %
Neutro Abs: 5.4 10*3/uL (ref 1.7–7.7)
Neutrophils Relative %: 68 %
Platelets: 134 10*3/uL — ABNORMAL LOW (ref 150–400)
RBC: 4.69 MIL/uL (ref 4.22–5.81)
RDW: 13.7 % (ref 11.5–15.5)
WBC: 8 10*3/uL (ref 4.0–10.5)
nRBC: 0 % (ref 0.0–0.2)

## 2021-05-13 LAB — COMPREHENSIVE METABOLIC PANEL WITH GFR
ALT: 8 U/L (ref 0–44)
AST: 11 U/L — ABNORMAL LOW (ref 15–41)
Albumin: 3.2 g/dL — ABNORMAL LOW (ref 3.5–5.0)
Alkaline Phosphatase: 70 U/L (ref 38–126)
Anion gap: 10 (ref 5–15)
BUN: 12 mg/dL (ref 8–23)
CO2: 24 mmol/L (ref 22–32)
Calcium: 8.6 mg/dL — ABNORMAL LOW (ref 8.9–10.3)
Chloride: 103 mmol/L (ref 98–111)
Creatinine, Ser: 0.79 mg/dL (ref 0.61–1.24)
GFR, Estimated: >60 mL/min
Glucose, Bld: 110 mg/dL — ABNORMAL HIGH (ref 70–99)
Potassium: 3.5 mmol/L (ref 3.5–5.1)
Sodium: 137 mmol/L (ref 135–145)
Total Bilirubin: 1.3 mg/dL — ABNORMAL HIGH (ref 0.3–1.2)
Total Protein: 6.1 g/dL — ABNORMAL LOW (ref 6.5–8.1)

## 2021-05-13 LAB — PHOSPHORUS: Phosphorus: 2.4 mg/dL — ABNORMAL LOW (ref 2.5–4.6)

## 2021-05-13 LAB — MAGNESIUM: Magnesium: 1.9 mg/dL (ref 1.7–2.4)

## 2021-05-13 MED ORDER — ENSURE ENLIVE PO LIQD
237.0000 mL | Freq: Two times a day (BID) | ORAL | Status: DC
  Administered 2021-05-13 – 2021-05-25 (×20): 237 mL via ORAL

## 2021-05-13 MED ORDER — POTASSIUM PHOSPHATES 15 MMOLE/5ML IV SOLN
15.0000 mmol | Freq: Once | INTRAVENOUS | Status: AC
  Administered 2021-05-13: 15 mmol via INTRAVENOUS
  Filled 2021-05-13: qty 5
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
## 2021-05-13 NOTE — Evaluation (Signed)
Occupational Therapy Evaluation Patient Details Name: Andre [name] MRN: [id number] DOB: 09-07-1935 Today's Date: 05/13/2021   History of Present Illness Pt admitted for delirium. HIstory includes Parkinson's dx, CAD, and HTN. Pt currently has sitter at bedside.   Clinical Impression   Pt seen for OT evaluation this date in setting of acute hospitalization d/t delirium. He presents this date pleasantly confused and unable to reliably describe PLOF. He currently requires: SETUP for seated UB with cues to seuqnece, MIN A for seated LB ADLs, MOD A for standing LB ADLs such as peri care with RW for UE support, MIN A/CGA for ADL transfers. Pt engaged in UB bathing/dressing in sitting with SETUP and cues to sequence, then standing LB bathing with MOD A. Pt transferred to chair with MIN A/CGA and left with all needs met and in reach, sitter present throughout. Will continue to follow. Recommend f/u with STR OT.     Recommendations for follow up therapy are one component of a multi-disciplinary discharge planning process, led by the attending physician.  Recommendations may be updated based on patient status, additional functional criteria and insurance authorization.   Follow Up Recommendations  Skilled nursing-short term rehab (<3 hours/day)    Assistance Recommended at Discharge Frequent or constant Supervision/Assistance  Patient can return home with the following A little help with walking and/or transfers;A little help with bathing/dressing/bathroom;Assistance with cooking/housework;Direct supervision/assist for medications management;Direct supervision/assist for financial management;Assist for transportation    Functional Status Assessment  Patient has had a recent decline in their functional status and demonstrates the ability to make significant improvements in function in a reasonable and predictable amount of time.  Equipment Recommendations  BSC/3in1;Tub/shower seat;Other (comment) (2ww)     Recommendations for Other Services       Precautions / Restrictions Precautions Precautions: Fall Restrictions Weight Bearing Restrictions: No      Mobility Bed Mobility Overal bed mobility: Needs Assistance Bed Mobility: Supine to Sit     Supine to sit: Min assist, Min guard, HOB elevated     General bed mobility comments: increased time    Transfers Overall transfer level: Needs assistance Equipment used: Rolling walker (2 wheels) Transfers: Sit to/from Stand Sit to Stand: Min assist, Min guard           General transfer comment: increased time, cues for use of walker      Balance Overall balance assessment: Needs assistance Sitting-balance support: Feet supported Sitting balance-Leahy Scale: Good     Standing balance support: Bilateral upper extremity supported Standing balance-Leahy Scale: Fair                             ADL either performed or assessed with clinical judgement   ADL                                         General ADL Comments: SETUP for seated UB with cues to seuqnece, MIN A for seated LB ADLs, MOD A for standing LB ADLs such as peri care with RW for UE support, MIN A/CGA for ADL transfers     Vision   Additional Comments: diffciult to formally asssess d/t cogntiion     Perception     Praxis      Pertinent Vitals/Pain Pain Assessment Pain Assessment: No/denies pain     Hand Dominance  Extremity/Trunk Assessment Upper Extremity Assessment Upper Extremity Assessment: Overall WFL for tasks assessed;Generalized weakness   Lower Extremity Assessment Lower Extremity Assessment: Overall WFL for tasks assessed;Generalized weakness       Communication Communication Communication: Expressive difficulties;HOH   Cognition Arousal/Alertness: Awake/alert Behavior During Therapy: WFL for tasks assessed/performed Overall Cognitive Status: No family/caregiver present to determine baseline  cognitive functioning                                 General Comments: oriented only to self, able to follow commands with increased processing time, pleasantly confused, benefits from tactile cues     General Comments       Exercises Other Exercises Other Exercises: Ot engages pt in bathing tasks with SETUP for seated UB and MOD A for standing LB   Shoulder Instructions      Home Living Family/patient expects to be discharged to:: Private residence Living Arrangements: Spouse/significant other Available Help at Discharge: Family;Available 24 hours/day Type of Home: House Home Access: Stairs to enter [employer] of Steps: 2 Entrance Stairs-Rails: Can reach both;Left;Right Home Layout: One level     Bathroom Shower/Tub: Tub/shower unit     Bathroom Accessibility: Yes   Home Equipment: Conservation officer, nature (2 wheels);Cane - single point   Additional Comments: pt is poor historian, home set up obtained from previous chart      Prior Functioning/Environment Prior Level of Function : Independent/Modified Independent             Mobility Comments: per last PT note, was ambulatory with RW. Pt is poor historian and unable to give accurate history          OT Problem List: Decreased strength;Decreased activity tolerance;Decreased cognition;Decreased safety awareness;Decreased knowledge of use of DME or AE      OT Treatment/Interventions: Self-care/ADL training;Therapeutic exercise;Therapeutic activities    OT Goals(Current goals can be found in the care plan section) Acute Rehab OT Goals Patient Stated Goal: none stated OT Goal Formulation: Patient unable to participate in goal setting Time For Goal Achievement: 05/27/21 Potential to Achieve Goals: Good ADL Goals Pt Will Perform Lower Body Dressing: (P) with supervision Pt Will Transfer to Toilet: (P) with supervision Pt Will Perform Toileting - Clothing Manipulation and hygiene: (P) with  supervision  OT Frequency: Min 2X/week    Co-evaluation              AM-PAC OT "6 Clicks" Daily Activity     Outcome Measure Help from another person eating meals?: None Help from another person taking care of personal grooming?: None Help from another person toileting, which includes using toliet, bedpan, or urinal?: A Little Help from another person bathing (including washing, rinsing, drying)?: A Lot Help from another person to put on and taking off regular upper body clothing?: None Help from another person to put on and taking off regular lower body clothing?: A Little 6 Click Score: 20   End of Session Equipment Utilized During Treatment: Gait belt;Rolling walker (2 wheels) Nurse Communication: Mobility status  Activity Tolerance: Patient tolerated treatment well Patient left: in chair;with call bell/phone within reach;with chair alarm set;with nursing/sitter in room  OT Visit Diagnosis: Unsteadiness on feet (R26.81);Muscle weakness (generalized) (M62.81);Other symptoms and signs involving cognitive function                Time: 3710-6269 OT Time Calculation (min): 23 min Charges:  OT General Charges $OT Visit: 1 Visit  OT Evaluation $OT Eval Moderate Complexity: 1 Mod OT Treatments $Self Care/Home Management : 8-22 mins  Gerrianne Scale, MS, OTR/L ascom [phone] 05/13/21, 6:01 PM

## 2021-05-13 NOTE — Progress Notes (Signed)
SLP Cancellation Note  Patient Details Name: Andre [name] MRN: [id number] DOB: 1936/03/20   Cancelled treatment:       Reason Eval/Treat Not Completed: SLP screened, no needs identified, will sign off  Pt passed [place] yesterday and has been consuming soft diet without issue. Pt screened with no acute issues observed, pt's sitter was also present during breakfast and no overt s/s of aspiration were observed.   Continue currently prescribed diet.   Andre [name] M.S., CCC-SLP, [place] Office [phone]  Andre [name] 05/13/2021, 10:15 AM

## 2021-05-13 NOTE — Progress Notes (Signed)
PROGRESS NOTE    Andre [name]  JIR:[id number] DOB: September 17, 1935 DOA: 05/11/2021 PCP: [name], MD  Brief Narrative:  The patient is an 86 year old Caucasian male with a past medical history significant for but not limited to CAD status post one-vessel CABG, chronic diastolic CHF, permanent atrial fibrillation on anticoagulation with Eliquis, OSA on CPAP, history of pulmonary fibrosis, hypothyroidism, Parkinson's disease with dementia as well as other comorbidities who was brought to the ED with a 5-day history of altered mental status as well as confusion and aggressive behavior.  He was noted to track his wife prior to EMS arrival and he was hospitalized from 04/11/2021 - 04/13/2021 with acute hypoxic respiratory failure secondary to CHF exacerbation and discharged home with an increased dose of Lasix 40 mg daily.  He was in his usual state of health when he follow-up with cardiology on 04/27/2021.  History is limited due to his patient's confusion but he denies any chest pain, shortness of breath, cough, abdominal pain, nausea or vomiting.  Wife contributed most of the history as patient was lethargic from the Haldol that was given to him in the ED.  Of note he was IVC'd and then psychiatry was consulted and they recommended haloperidol 2 mg every 4 hours as needed as needed as well as a one-to-one sitter.  They discontinued his bupropion to avoid any stimulation from that medication.  Patient remains confused but he did passes bedside swallow screen and so we will place him on a soft diet and await for SLP recommendations and they screened the patient and feel he is on an appropriate diet with SOFT diet. Palliative Care Consulted and now he is DNR. PT evaluated and recommended recommending Home Health but wife cannot safely take care of the patient so he will need placement.   Assessment & Plan:   Principal Problem:   Delirium Active Problems:   OSA (obstructive sleep apnea)   PAF  (paroxysmal atrial fibrillation) (HCC)   Parkinson's disease (HCC)   Coronary artery disease involving native coronary artery of native heart with angina pectoris ([place])   Essential hypertension   (HFpEF) heart failure with preserved ejection fraction (HCC)   Dementia with parkinsonism (HCC)   Acute metabolic encephalopathy   Acute hyperactive delirium due to another medical condition   Lactic acidosis   BPH (benign prostatic hyperplasia)   Altered mental status   Sepsis ([place])  Acute metabolic encephalopathy, improving Acute hyperactive delirium due to another medical condition, improving -Etiology uncertain, improving and he is getting closer to his baseline and is much more awake and alert -Differential includes infectious versus acute delirium related to Parkinson's dementia, medication , hypoxia/hypercapnia, PE -Follow-up UA to evaluate for UTI and his urinalysis was relatively unremarkable with trace leukocytes, negative nitrates, rare bacteria, 0-5 RBCs per high-power field, 0-5 WBCs and no squamous epithelial cells -UDS was negative -Bladder scan to evaluate for urinary retention -We will get D-dimer and proceed to CTA chest if elevated; D-dimer was slightly elevated at 0.55 so we will obtain a CT of the chest to rule out PE -CTA of the chest done and showed "No evidence of pulmonary embolus. Bilateral calcified pleural plaques. Small right pleural effusion  and trace left pleural effusion. Cardiomegaly, coronary artery disease.   Probable fibrosis in the lower lung zones. Aortic Atherosclerosis." -We will get VBG or ABG, given recent admission for respiratory failure -VBG done and showed a pH of 7.43, PCO2 49, PO2 of less than 31.0, bicarbonate level  32.5 and an O2 saturation of 31.2 -Continue Haldol as needed -No indication for antibiotics that he is afebrile and has no white count now.  WBC is trended down from 12.6 and is now normal at 7.2 yesterday and is now 8.0 -Patient has  haloperidol 2 mg IV every 6 as needed for agitation as well as ziprasidone 20 mg IM every 12 as needed agitation as well as quetiapine 25 mg p.o. nightly as well sertraline 50 mg p.o. nightly -Patient also has been ordered 2 mg of IV diazepam every 6 as needed for anxiety -Palliative care has been consulted for goals of care discussion and he is now a DNR -Patient's mentation is improving significantly and he is getting closer to baseline.  Likely will need placement given that his wife cannot take care of him at home anymore    Lactic Acidosis with Sepsis ruled out -No strong evidence to support sepsis diagnosis -Continued IV fluids from the ED -Patient mildly tachypneic but without hypoxia -VBG as above and will obtain a CT of the chest to rule out PE however this is less likely given that he is on anticoagulation -PCT was less than 0.10   OSA (obstructive sleep apnea) -Continue with CPAP   PAF (paroxysmal atrial fibrillation) (HCC) -Chronic anticoagulation -Continue Apixaban 5 mg p.o. twice daily and carvedilol 6.25 mg p.o. twice daily  Hyperbilirubinemia -Mild.  Patient's T bili went from 1.2 is now 1.3 -Continue monitor and trend and repeat CMP in a.m.  Hypophosphatemia -Patient's phosphorus level is 2.4 -Replete with IV K-Phos 15 mmol -Continue to monitor and replete as necessary -Repeat Phos Level in the AM    Parkinson's disease with dementia ([place]) and behavioral disturbances -Continue Sinemet 25-50 0.5 tab 4x Daily and Entacapone 200 mg po TID -Continue Seroquel but his bupropion has now been discontinued to avoid any stimulation from that medication per psychiatry -Continue with haloperidol 2 mg every 4 hours as needed  -Patient's mentation is much improved now   Coronary artery disease involving native coronary artery of native heart with angina pectoris (HCC) -Continue carvedilol and losartan and rosuvastatin -Currently no chest pain   (HFpEF) heart failure with  preserved ejection fraction ([place]) -Appears compensated  -His carvedilol has been resumed at 6.25 mg p.o. twice daily but his losartan and his furosemide had not been resumed but will resume in the morning -Strict I's and O's and daily weights -Patient is -943 mL since admission  Hypothyroidism -Continue with Levothyroxine 50 mcg p.o. daily   BPH -Continue Rapaflo substitution with Flomax 0.4 mg daily  Hypokalemia -Patient's K+ was 3.0 and improved to 3.5 this morning -Replete with IV Kcl 40 mEQ x1 yesterday and will be replete with IV K-Phos 15 mmol today -Check Mag Level and was 1.9 -Continue to Monitor and Replete as Necessary -Repeat CMP in the AM   Obesity -Complicates overall prognosis and care -Estimated body mass index is 36.15 kg/m as calculated from the following:   Height as of this encounter: 5\' 6"  (1.676 m).   Weight as of this encounter: 101.6 kg.  -Weight Loss and Dietary Counseling given -Nutritionist consulted and recommending Ensure Enlive p.o. twice daily as well as multivitamin with minerals daily and feeding assistance with meals  DVT prophylaxis: Anticoagulated with apixaban 5 mg p.o. twice daily Code Status: DO NOT RESUSCITATE  Family Communication: No family currently at bedside Disposition Plan: Pending further clinical improvement and evaluation by PT OT and they have recommended home health but  wife cannot take him home safely so likely will need placement  Status is: Inpatient  Remains inpatient appropriate because: Still remains confused and a little agitated  Consultants:  Psychiatry Dr. Weber Cooks Palliative care medicine  Procedures: None  Antimicrobials:  Anti-infectives (From admission, onward)    Start     Dose/Rate Route Frequency Ordered Stop   05/11/21 0400  vancomycin (VANCOREADY) IVPB 2000 mg/400 mL        2,000 mg 200 mL/hr over 120 Minutes Intravenous  Once 05/11/21 0346 05/11/21 0846   05/11/21 0345  ceFEPIme (MAXIPIME) 2 g in  sodium chloride 0.9 % 100 mL IVPB        2 g 200 mL/hr over 30 Minutes Intravenous  Once 05/11/21 0344 05/11/21 0503   05/11/21 0345  metroNIDAZOLE (FLAGYL) IVPB 500 mg        500 mg 100 mL/hr over 60 Minutes Intravenous  Once 05/11/21 0344 05/11/21 0616   05/11/21 0345  vancomycin (VANCOCIN) IVPB 1000 mg/200 mL premix  Status:  Discontinued        1,000 mg 200 mL/hr over 60 Minutes Intravenous  Once 05/11/21 0344 05/11/21 0346        Subjective: Seen and examined and he is sitting in chair at bedside and is much more awake and alert.  Not as confused.  Answers questions appropriately.  Laughing and joking.  No chest pain or shortness of breath.  No other concerns or complaints this time.  Objective: Vitals:   05/12/21 1816 05/12/21 2023 05/13/21 0527 05/13/21 0840  BP: (!) 146/88 119/76 140/72 (!) 149/82  Pulse: 99 70 71 80  Resp: 19 18 16 18   Temp: 97.8 F (36.6 C) 97.7 F (36.5 C)  98.3 F (36.8 C)  TempSrc: Oral Oral    SpO2: 97% 95% 98% 99%  Weight:      Height:        Intake/Output Summary (Last 24 hours) at 05/13/2021 1516 Last data filed at 05/12/2021 2200 Gross per 24 hour  Intake 758 ml  Output 501 ml  Net 257 ml    Filed Weights   05/11/21 0225  Weight: 101.6 kg   Examination: Physical Exam:  Constitutional: WN/WD obese Caucasian male currently in no acute distress and appears much more awake and alert Eyes: Lids and conjunctivae normal, sclerae anicteric  ENMT: External Ears, Nose appear normal. Grossly normal hearing. Mucous membranes are moist.  Neck: Appears normal, supple, no cervical masses, normal ROM, no appreciable thyromegaly; no appreciable JVD Respiratory: Diminished to auscultation bilaterally, no wheezing, rales, rhonchi or crackles. Normal respiratory effort and patient is not tachypenic. No accessory muscle use.  Unlabored breathing Cardiovascular: RRR, no murmurs / rubs / gallops. S1 and S2 auscultated.  Trace to 1+ extremity edema.   Abdomen: Soft, non-tender, distended secondary body. Bowel sounds positive.  GU: Deferred. Musculoskeletal: No clubbing / cyanosis of digits/nails. No joint deformity upper and lower extremities. Skin: No rashes, lesions, ulcers on limited skin evaluation. No induration; Warm and dry.  Neurologic: CN 2-12 grossly intact with no focal deficits. Romberg sign and cerebellar reflexes not assessed.  Psychiatric: Normal judgment and insight. Alert and oriented x 2.  Pleasant and normal mood and appropriate affect.   Data Reviewed: I have personally reviewed following labs and imaging studies  CBC: Recent Labs  Lab 05/11/21 0249 05/12/21 0514 05/13/21 0441  WBC 9.7 7.2 8.0  NEUTROABS 6.2  --  5.4  HGB 15.5 14.3 14.1  HCT 46.5 43.6 42.6  MCV 91.7 92.2 90.8  PLT 142* 130* 134*    Basic Metabolic Panel: Recent Labs  Lab 05/11/21 0249 05/12/21 0514 05/13/21 0441  NA 133* 139 137  K 3.6 3.0* 3.5  CL 93* 105 103  CO2 29 26 24   GLUCOSE 112* 93 110*  BUN 17 12 12   CREATININE 0.94 0.77 0.79  CALCIUM 9.3 8.7* 8.6*  MG  --  2.1 1.9  PHOS  --  3.1 2.4*    GFR: Estimated Creatinine Clearance: 78.1 mL/min (by C-G formula based on SCr of 0.79 mg/dL). Liver Function Tests: Recent Labs  Lab 05/11/21 0249 05/13/21 0441  AST 16 11*  ALT 7 8  ALKPHOS 85 70  BILITOT 1.2 1.3*  PROT 6.9 6.1*  ALBUMIN 3.7 3.2*    Recent Labs  Lab 05/11/21 0249  LIPASE 24    No results for input(s): AMMONIA in the last 168 hours. Coagulation Profile: No results for input(s): INR, PROTIME in the last 168 hours. Cardiac Enzymes: No results for input(s): CKTOTAL, CKMB, CKMBINDEX, TROPONINI in the last 168 hours. BNP (last 3 results) No results for input(s): PROBNP in the last 8760 hours. HbA1C: No results for input(s): HGBA1C in the last 72 hours. CBG: Recent Labs  Lab 05/12/21 1619  GLUCAP 133*   Lipid Profile: No results for input(s): CHOL, HDL, LDLCALC, TRIG, CHOLHDL, LDLDIRECT in the  last 72 hours. Thyroid Function Tests: No results for input(s): TSH, T4TOTAL, FREET4, T3FREE, THYROIDAB in the last 72 hours. Anemia Panel: No results for input(s): VITAMINB12, FOLATE, FERRITIN, TIBC, IRON, RETICCTPCT in the last 72 hours. Sepsis Labs: Recent Labs  Lab 05/11/21 0249 05/11/21 0454  PROCALCITON <0.10  --   LATICACIDVEN 2.8* 1.2     Recent Results (from the past 240 hour(s))  Resp Panel by RT-PCR (Flu A&B, Covid) Nasopharyngeal Swab     Status: None   Collection Time: 05/11/21  2:31 AM   Specimen: Nasopharyngeal Swab; Nasopharyngeal(NP) swabs in vial transport medium  Result Value Ref Range Status   SARS Coronavirus 2 by RT PCR NEGATIVE NEGATIVE Final    Comment: (NOTE) SARS-CoV-2 target nucleic acids are NOT DETECTED.  The SARS-CoV-2 RNA is generally detectable in upper respiratory specimens during the acute phase of infection. The lowest concentration of SARS-CoV-2 viral copies this assay can detect is 138 copies/mL. A negative result does not preclude SARS-Cov-2 infection and should not be used as the sole basis for treatment or other patient management decisions. A negative result may occur with  improper specimen collection/handling, submission of specimen other than nasopharyngeal swab, presence of viral mutation(s) within the areas targeted by this assay, and inadequate number of viral copies(<138 copies/mL). A negative result must be combined with clinical observations, patient history, and epidemiological information. The expected result is Negative.  Fact Sheet for Patients:  EntrepreneurPulse.com.au  Fact Sheet for Healthcare Providers:  IncredibleEmployment.be  This test is no t yet approved or cleared by the [place] FDA and  has been authorized for detection and/or diagnosis of SARS-CoV-2 by FDA under an Emergency Use Authorization (EUA). This EUA will remain  in effect (meaning this test can be used)  for the duration of the COVID-19 declaration under Section 564(b)(1) of the Act, 21 U.S.C.section 360bbb-3(b)(1), unless the authorization is terminated  or revoked sooner.       Influenza A by PCR NEGATIVE NEGATIVE Final   Influenza B by PCR NEGATIVE NEGATIVE Final    Comment: (NOTE) The Xpert Xpress SARS-CoV-2/FLU/RSV plus assay is  intended as an aid in the diagnosis of influenza from Nasopharyngeal swab specimens and should not be used as a sole basis for treatment. Nasal washings and aspirates are unacceptable for Xpert Xpress SARS-CoV-2/FLU/RSV testing.  Fact Sheet for Patients: EntrepreneurPulse.com.au  Fact Sheet for Healthcare Providers: IncredibleEmployment.be  This test is not yet approved or cleared by the [place] FDA and has been authorized for detection and/or diagnosis of SARS-CoV-2 by FDA under an Emergency Use Authorization (EUA). This EUA will remain in effect (meaning this test can be used) for the duration of the COVID-19 declaration under Section 564(b)(1) of the Act, 21 U.S.C. section 360bbb-3(b)(1), unless the authorization is terminated or revoked.  Performed at [facility] [address] [place], [place] [address]   Culture, blood (routine x 2)     Status: None (Preliminary result)   Collection Time: 05/11/21  2:49 AM   Specimen: BLOOD  Result Value Ref Range Status   Specimen Description BLOOD LEFT HAND  Final   Special Requests IN PEDIATRIC BOTTLE Blood Culture adequate volume  Final   Culture   Final    NO GROWTH 2 DAYS Performed at [facility], [address] [place], [place] [address]    Report Status PENDING  Incomplete  Culture, blood (routine x 2)     Status: None (Preliminary result)   Collection Time: 05/11/21  2:49 AM   Specimen: BLOOD  Result Value Ref Range Status   Specimen Description BLOOD LEFT ARM  Final   Special Requests IN PEDIATRIC BOTTLE Blood Culture  adequate volume  Final   Culture   Final    NO GROWTH 2 DAYS Performed at [facility], [address], [place] [employer] [place], [place] [address]    Report Status PENDING  Incomplete  Urine Culture     Status: None   Collection Time: 05/11/21  4:45 AM   Specimen: Urine, Clean Catch  Result Value Ref Range Status   Specimen Description   Final    URINE, CLEAN CATCH Performed at [facility], [address], [place], [place] [address]    Special Requests   Final    NONE Performed at [facility], [address], [place], [place] [address]    Culture   Final    NO GROWTH Performed at [facility], [place] [address] [place], [place] [address]    Report Status 05/12/2021 FINAL  Final     RN Pressure Injury Documentation: Pressure Injury 04/12/21 Coccyx Medial Stage 2 -  Partial thickness loss of dermis presenting as a shallow open injury with a red, pink wound bed without slough. cracking mid coccyx (Active)  04/12/21 1930  Location: Coccyx  Location Orientation: Medial  Staging: Stage 2 -  Partial thickness loss of dermis presenting as a shallow open injury with a red, pink wound bed without slough.  Wound Description (Comments): cracking mid coccyx  Present on Admission:     Estimated body mass index is 36.15 kg/m as calculated from the following:   Height as of this encounter: 5\' 6"  (1.676 m).   Weight as of this encounter: 101.6 kg.  Malnutrition Type: Nutrition Problem: Inadequate oral intake Etiology: inability to eat Malnutrition Characteristics: Signs/Symptoms: NPO status Nutrition Interventions: Interventions: Ensure Enlive (each supplement provides 350kcal and 20 grams of protein), MVI  Radiology Studies: CT Angio Chest Pulmonary Embolism (PE) W or WO Contrast  Result Date: 05/12/2021 CLINICAL DATA:  Pulmonary embolism (PE) suspected, positive D-dimer EXAM: CT ANGIOGRAPHY  CHEST WITH CONTRAST TECHNIQUE: Multidetector CT imaging of the chest was  performed using the standard protocol during bolus administration of intravenous contrast. Multiplanar CT image reconstructions and MIPs were obtained to evaluate the vascular anatomy. RADIATION DOSE REDUCTION: This exam was performed according to the departmental dose-optimization program which includes automated exposure control, adjustment of the mA and/or kV according to patient size and/or use of iterative reconstruction technique. CONTRAST:  45mL OMNIPAQUE IOHEXOL 350 MG/ML SOLN COMPARISON:  10/23/2019 FINDINGS: Cardiovascular: No filling defects in the pulmonary arteries to suggest pulmonary emboli. Cardiomegaly. Coronary artery and aortic calcifications. Mediastinum/Nodes: No mediastinal, hilar, or axillary adenopathy. Trachea and esophagus are unremarkable. Thyroid unremarkable. Lungs/Pleura: Bilateral calcified pleural plaques. Small right pleural effusion and trace left pleural effusion. Interstitial thickening and ground-glass opacities peripherally in the mid and lower lungs, likely fibrosis. No acute confluent airspace opacities. Upper Abdomen: No acute findings Musculoskeletal: Chest wall soft tissues are unremarkable. No acute bony abnormality. Review of the MIP images confirms the above findings. IMPRESSION: No evidence of pulmonary embolus. Bilateral calcified pleural plaques. Small right pleural effusion and trace left pleural effusion. Cardiomegaly, coronary artery disease. Probable fibrosis in the lower lung zones. Aortic Atherosclerosis (ICD10-I70.0). Electronically Signed   By: Rolm Baptise M.D.   On: 05/12/2021 18:46    Scheduled Meds:  apixaban  5 mg Oral BID   carbidopa-levodopa  0.5 tablet Oral QID   carvedilol  6.25 mg Oral BID WC   entacapone  200 mg Oral TID   feeding supplement  237 mL Oral BID BM   levothyroxine  50 mcg Oral Q0600   multivitamin with minerals  1 tablet Oral Daily   QUEtiapine  25 mg Oral QHS   sertraline  50 mg Oral Daily   tamsulosin  0.4 mg Oral Daily    Continuous Infusions:  potassium PHOSPHATE IVPB (in mmol) 15 mmol (05/13/21 1038)    LOS: 2 days   Kerney Elbe, DO [facility] PAGER is on [place]  If 7PM-7AM, please contact night-coverage [email or web address]

## 2021-05-13 NOTE — Progress Notes (Signed)
Palliative: ° °I met today at Andre [name]' bedside. He is sitting up in chair. He is alert and calm although with baseline confusion. No family at bedside. He is eating and taking his medications. DNR was confirmed with wife yesterday. Main issue will be placement. Discussed with [facility] and Dr. Sheikh. He is not eligible for any hospice services. He could benefit from outpatient palliative to follow.  ° °No charge ° ° , NP °Palliative Medicine Team °Pager [phone] (Please see [email or web address] for schedule) °Team Phone [phone]  ° °

## 2021-05-14 LAB — MAGNESIUM: Magnesium: 2 mg/dL (ref 1.7–2.4)

## 2021-05-14 LAB — CBC WITH DIFFERENTIAL/PLATELET
Abs Immature Granulocytes: 0.02 10*3/uL (ref 0.00–0.07)
Basophils Absolute: 0 10*3/uL (ref 0.0–0.1)
Basophils Relative: 0 %
Eosinophils Absolute: 0.2 10*3/uL (ref 0.0–0.5)
Eosinophils Relative: 2 %
HCT: 43.8 % (ref 39.0–52.0)
Hemoglobin: 14.4 g/dL (ref 13.0–17.0)
Immature Granulocytes: 0 %
Lymphocytes Relative: 23 %
Lymphs Abs: 1.6 10*3/uL (ref 0.7–4.0)
MCH: 29.8 pg (ref 26.0–34.0)
MCHC: 32.9 g/dL (ref 30.0–36.0)
MCV: 90.7 fL (ref 80.0–100.0)
Monocytes Absolute: 0.7 10*3/uL (ref 0.1–1.0)
Monocytes Relative: 10 %
Neutro Abs: 4.5 10*3/uL (ref 1.7–7.7)
Neutrophils Relative %: 65 %
Platelets: 131 10*3/uL — ABNORMAL LOW (ref 150–400)
RBC: 4.83 MIL/uL (ref 4.22–5.81)
RDW: 13.9 % (ref 11.5–15.5)
WBC: 7 10*3/uL (ref 4.0–10.5)
nRBC: 0 % (ref 0.0–0.2)

## 2021-05-14 LAB — URINALYSIS, COMPLETE (UACMP) WITH MICROSCOPIC
Glucose, UA: 100 mg/dL — AB
Hgb urine dipstick: NEGATIVE
Ketones, ur: 15 mg/dL — AB
Leukocytes,Ua: NEGATIVE
Nitrite: NEGATIVE
Protein, ur: 30 mg/dL — AB
Specific Gravity, Urine: 1.015 (ref 1.005–1.030)
pH: 5.5 (ref 5.0–8.0)

## 2021-05-14 LAB — COMPREHENSIVE METABOLIC PANEL
ALT: 10 U/L (ref 0–44)
AST: 14 U/L — ABNORMAL LOW (ref 15–41)
Albumin: 3.1 g/dL — ABNORMAL LOW (ref 3.5–5.0)
Alkaline Phosphatase: 75 U/L (ref 38–126)
Anion gap: 8 (ref 5–15)
BUN: 11 mg/dL (ref 8–23)
CO2: 24 mmol/L (ref 22–32)
Calcium: 8.5 mg/dL — ABNORMAL LOW (ref 8.9–10.3)
Chloride: 104 mmol/L (ref 98–111)
Creatinine, Ser: 0.77 mg/dL (ref 0.61–1.24)
GFR, Estimated: >60 mL/min (ref >60)
Glucose, Bld: 112 mg/dL — ABNORMAL HIGH (ref 70–99)
Potassium: 3.3 mmol/L — ABNORMAL LOW (ref 3.5–5.1)
Sodium: 136 mmol/L (ref 135–145)
Total Bilirubin: 1.2 mg/dL (ref 0.3–1.2)
Total Protein: 5.9 g/dL — ABNORMAL LOW (ref 6.5–8.1)

## 2021-05-14 LAB — PHOSPHORUS: Phosphorus: 3.1 mg/dL (ref 2.5–4.6)

## 2021-05-14 MED ORDER — POTASSIUM CHLORIDE CRYS ER 20 MEQ PO TBCR
40.0000 meq | EXTENDED_RELEASE_TABLET | Freq: Two times a day (BID) | ORAL | Status: AC
  Administered 2021-05-14 (×2): 40 meq via ORAL
  Filled 2021-05-14: qty 2

## 2021-05-14 NOTE — TOC Progression Note (Signed)
Transition of Care [facility]) - Progression Note    Patient Details  Name: Andre [name] MRN: [id number] Date of Birth: 1936-04-11  Transition of [facility]) CM/SW [place], RN Phone Number: 05/14/2021, 3:34 PM  Clinical Narrative:   Patient IVC, awaiting psych for disposition         Expected Discharge Plan and Services                                                 Social Determinants of Health (SDOH) Interventions    Readmission Risk Interventions No flowsheet data found.

## 2021-05-14 NOTE — Progress Notes (Signed)
OT Cancellation Note  Patient Details Name: Andre [name] MRN: [id number] DOB: 1935/04/19   Cancelled Treatment:    Reason Eval/Treat Not Completed: Fatigue/lethargy limiting ability to participate Attempted to rouse pt for treatment session using multiple modes of stimuli. While he does verbally respond somewhat as well as some moans/groans, he does not meaningfully communicate or open his eyes despite multiple attempts. Nursing notes indicate that pt required doses of both Haldol and Ativan overnight for agitation so this could be contributing. Will f/u at later date/time for OT treatment as pt becomes more appropriate.  Gerrianne Scale, [place], OTR/L ascom [phone] 05/14/21, 11:29 AM

## 2021-05-14 NOTE — Care Management Important Message (Signed)
Important Message  Patient Details  Name: Andre [name] MRN: [id number] Date of Birth: 06-Dec-1935   Medicare Important Message Given:  Yes     Juliann Pulse A Kreg Earhart 05/14/2021, 12:21 PM
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
## 2021-05-14 NOTE — Progress Notes (Signed)
PT Cancellation Note  Patient Details Name: BRICESON BROADWATER MRN: [id number] DOB: Apr 09, 1936   Cancelled Treatment:    Reason Eval/Treat Not Completed: Fatigue/lethargy limiting ability to participate. Attempted to rouse pt for treatment session.  Nursing notes indicate that pt required doses of both Haldol and Ativan overnight for agitation.  Will re-attempt PT at later time/date as appropriate.    Tamella Tuccillo 05/14/2021, 11:54 AM

## 2021-05-15 LAB — PHOSPHORUS: Phosphorus: 3 mg/dL (ref 2.5–4.6)

## 2021-05-15 LAB — CBC WITH DIFFERENTIAL/PLATELET
Abs Immature Granulocytes: 0.04 10*3/uL (ref 0.00–0.07)
Basophils Absolute: 0 10*3/uL (ref 0.0–0.1)
Basophils Relative: 0 %
Eosinophils Absolute: 0.1 10*3/uL (ref 0.0–0.5)
Eosinophils Relative: 1 %
HCT: 45.9 % (ref 39.0–52.0)
Hemoglobin: 15 g/dL (ref 13.0–17.0)
Immature Granulocytes: 0 %
Lymphocytes Relative: 20 %
Lymphs Abs: 1.9 10*3/uL (ref 0.7–4.0)
MCH: 30.4 pg (ref 26.0–34.0)
MCHC: 32.7 g/dL (ref 30.0–36.0)
MCV: 93.1 fL (ref 80.0–100.0)
Monocytes Absolute: 0.8 10*3/uL (ref 0.1–1.0)
Monocytes Relative: 9 %
Neutro Abs: 6.4 10*3/uL (ref 1.7–7.7)
Neutrophils Relative %: 70 %
Platelets: 144 10*3/uL — ABNORMAL LOW (ref 150–400)
RBC: 4.93 MIL/uL (ref 4.22–5.81)
RDW: 13.9 % (ref 11.5–15.5)
WBC: 9.3 10*3/uL (ref 4.0–10.5)
nRBC: 0 % (ref 0.0–0.2)

## 2021-05-15 LAB — COMPREHENSIVE METABOLIC PANEL
ALT: 17 U/L (ref 0–44)
AST: 21 U/L (ref 15–41)
Albumin: 3.2 g/dL — ABNORMAL LOW (ref 3.5–5.0)
Alkaline Phosphatase: 89 U/L (ref 38–126)
Anion gap: 8 (ref 5–15)
BUN: 14 mg/dL (ref 8–23)
CO2: 24 mmol/L (ref 22–32)
Calcium: 9.1 mg/dL (ref 8.9–10.3)
Chloride: 106 mmol/L (ref 98–111)
Creatinine, Ser: 0.9 mg/dL (ref 0.61–1.24)
GFR, Estimated: >60 mL/min (ref >60)
Glucose, Bld: 111 mg/dL — ABNORMAL HIGH (ref 70–99)
Potassium: 4.2 mmol/L (ref 3.5–5.1)
Sodium: 138 mmol/L (ref 135–145)
Total Bilirubin: 1 mg/dL (ref 0.3–1.2)
Total Protein: 6.6 g/dL (ref 6.5–8.1)

## 2021-05-15 LAB — MAGNESIUM: Magnesium: 2.1 mg/dL (ref 1.7–2.4)

## 2021-05-15 MED ORDER — KCL IN DEXTROSE-NACL 20-5-0.9 MEQ/L-%-% IV SOLN
INTRAVENOUS | Status: DC
  Filled 2021-05-15 (×11): qty 1000
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
## 2021-05-15 NOTE — Progress Notes (Signed)
PROGRESS NOTE    JOZIAH DOLLINS  DGU:[id number] DOB: 1935-05-15 DOA: 05/11/2021 PCP: [name], MD  Brief Narrative:  The patient is an 86 year old Caucasian male with a past medical history significant for but not limited to CAD status post one-vessel CABG, chronic diastolic CHF, permanent atrial fibrillation on anticoagulation with Eliquis, OSA on CPAP, history of pulmonary fibrosis, hypothyroidism, Parkinson's disease with dementia as well as other comorbidities who was brought to the ED with a 5-day history of altered mental status as well as confusion and aggressive behavior.  He was noted to track his wife prior to EMS arrival and he was hospitalized from 04/11/2021 - 04/13/2021 with acute hypoxic respiratory failure secondary to CHF exacerbation and discharged home with an increased dose of Lasix 40 mg daily.  He was in his usual state of health when he follow-up with cardiology on 04/27/2021.  History is limited due to his patient's confusion but he denies any chest pain, shortness of breath, cough, abdominal pain, nausea or vomiting.  Wife contributed most of the history as patient was lethargic from the Haldol that was given to him in the ED.  Of note he was IVC'd and then psychiatry was consulted and they recommended haloperidol 2 mg every 4 hours as needed as needed as well as a one-to-one sitter.  They discontinued his bupropion to avoid any stimulation from that medication.  Patient remains confused but he did passes bedside swallow screen and so we will place him on a soft diet and await for SLP recommendations and they screened the patient and feel he is on an appropriate diet with SOFT diet. Palliative Care Consulted and now he is DNR. PT evaluated and recommended recommending Home Health but wife cannot safely take care of the patient so he will need placement.   Yesterday he is much more withdrawn and somnolent and he received Haldol and Ativan overnight given his agitation.  This  is likely contributing to his fatigue and lethargy so his Ativan was discontinued.  We will continue his haloperidol.  He is improved but remains IVC.  I will reach out to psych to see if looking up to the IVC and have him help evaluate to determine his disposition.  Assessment & Plan:   Principal Problem:   Delirium Active Problems:   OSA (obstructive sleep apnea)   PAF (paroxysmal atrial fibrillation) (HCC)   Parkinson's disease ([place])   Coronary artery disease involving native coronary artery of native heart with angina pectoris ([place])   Essential hypertension   (HFpEF) heart failure with preserved ejection fraction (HCC)   Dementia with parkinsonism (HCC)   Acute metabolic encephalopathy   Acute hyperactive delirium due to another medical condition   Lactic acidosis   BPH (benign prostatic hyperplasia)   Altered mental status   Sepsis ([place])  Acute metabolic encephalopathy, improving but he is extremely somnolent today Acute hyperactive delirium due to another medical condition, improving but he was somnolent today -Etiology uncertain, improving and he is getting closer to his baseline and is much more awake and alert -Differential includes infectious versus acute delirium related to Parkinson's dementia, medication , hypoxia/hypercapnia, PE -Follow-up UA to evaluate for UTI and his urinalysis was relatively unremarkable with trace leukocytes, negative nitrates, rare bacteria, 0-5 RBCs per high-power field, 0-5 WBCs and no squamous epithelial cells -UDS was negative -Bladder scan to evaluate for urinary retention -We will get D-dimer and proceed to CTA chest if elevated; D-dimer was slightly elevated at 0.55 so  we will obtain a CT of the chest to rule out PE -CTA of the chest done and showed "No evidence of pulmonary embolus. Bilateral calcified pleural plaques. Small right pleural effusion  and trace left pleural effusion. Cardiomegaly, coronary artery disease.   Probable fibrosis in  the lower lung zones. Aortic Atherosclerosis." -We will get VBG or ABG, given recent admission for respiratory failure -VBG done and showed a pH of 7.43, PCO2 49, PO2 of less than 31.0, bicarbonate level 32.5 and an O2 saturation of 31.2 -Continue Haldol as needed -No indication for antibiotics that he is afebrile and has no white count now.  WBC is trended down from 12.6 and is now normal at 9.3 -Patient has haloperidol 2 mg IV every 6 as needed for agitation as well as ziprasidone 20 mg IM every 12 as needed agitation as well as quetiapine 25 mg p.o. nightly as well sertraline 50 mg p.o. nightly -Patient also has been ordered 2 mg of IV lorazepam every 6 as needed for anxiety but we have discontinued this -Palliative care has been consulted for goals of care discussion and he is now a DNR -Patient's mentation was improving significantly and he is getting closer to baseline.  Likely will need placement given that his wife cannot take care of him at home anymore but today he was much more fatigued and lethargic in the setting of Haldol and lorazepam administrations.  We will discontinue his lorazepam now -I have reached out to psych to reevaluate    Lactic Acidosis with Sepsis ruled out -No strong evidence to support sepsis diagnosis -Continued IV fluids from the ED -Patient mildly tachypneic but without hypoxia -VBG as above and will obtain a CT of the chest to rule out PE however this is less likely given that he is on anticoagulation -PCT was less than 0.10   OSA (obstructive sleep apnea) -Continue with CPAP   PAF (paroxysmal atrial fibrillation) (HCC) -Chronic anticoagulation -Continue Apixaban 5 mg p.o. twice daily and carvedilol 6.25 mg p.o. twice daily  Hyperbilirubinemia -Mild.  Patient's T bili went from 1.2 -> 1.3 -> 1.2 -> 1.3 today -Continue monitor and trend and repeat CMP in a.m.  Hypophosphatemia -Patient's phosphorus level is now 3.0 -Continue to monitor and replete as  necessary -Repeat Phos Level in the AM    Parkinson's disease with dementia ([place]) and behavioral disturbances -Continue Sinemet 25-50 0.5 tab 4x Daily and Entacapone 200 mg po TID -Continue Seroquel but his bupropion has now been discontinued to avoid any stimulation from that medication per psychiatry -Continue with haloperidol 2 mg every 4 hours as needed  -Patient's mentation is much improved now yesterday but today he is more somnolent after Haldol and Ativan being given last night -Placed on delirium precautions   Coronary artery disease involving native coronary artery of native heart with angina pectoris (HCC) -Continue carvedilol and losartan and rosuvastatin -Currently no chest pain   (HFpEF) heart failure with preserved ejection fraction ([place]) -Appears compensated  -His carvedilol has been resumed at 6.25 mg p.o. twice daily but his losartan and his furosemide had not been resumed but will resume in the morning -Strict I's and O's and daily weights -Patient is -2.290 L since admission  Hypothyroidism -Continue with Levothyroxine 50 mcg p.o. daily   BPH -Continue Rapaflo substitution with Flomax 0.4 mg daily  Hypokalemia -Patient's K+ is now 4.2 -Check Mag Level and was 2.1 -Continue to Monitor and Replete as Necessary -Repeat CMP in the AM  Obesity -Complicates overall prognosis and care -Estimated body mass index is 36.15 kg/m as calculated from the following:   Height as of this encounter: 5\' 6"  (1.676 m).   Weight as of this encounter: 101.6 kg.  -Weight Loss and Dietary Counseling given -Nutritionist consulted and recommending Ensure Enlive p.o. twice daily as well as multivitamin with minerals daily and feeding assistance with meals  DVT prophylaxis: Anticoagulated with apixaban 5 mg p.o. twice daily Code Status: DO NOT RESUSCITATE  Family Communication: No family currently at bedside currently but has a [place] Disposition Plan: Pending further clinical  improvement and evaluation by PT OT and they have recommended home health but wife cannot take him home safely so likely will need placement  Status is: Inpatient  Remains inpatient appropriate because: Still remains confused and a little agitated  Consultants:  Psychiatry Dr. Weber Cooks Palliative care medicine  Procedures: None  Antimicrobials:  Anti-infectives (From admission, onward)    Start     Dose/Rate Route Frequency Ordered Stop   05/11/21 0400  vancomycin (VANCOREADY) IVPB 2000 mg/400 mL        2,000 mg 200 mL/hr over 120 Minutes Intravenous  Once 05/11/21 0346 05/11/21 0846   05/11/21 0345  ceFEPIme (MAXIPIME) 2 g in sodium chloride 0.9 % 100 mL IVPB        2 g 200 mL/hr over 30 Minutes Intravenous  Once 05/11/21 0344 05/11/21 0503   05/11/21 0345  metroNIDAZOLE (FLAGYL) IVPB 500 mg        500 mg 100 mL/hr over 60 Minutes Intravenous  Once 05/11/21 0344 05/11/21 0616   05/11/21 0345  vancomycin (VANCOCIN) IVPB 1000 mg/200 mL premix  Status:  Discontinued        1,000 mg 200 mL/hr over 60 Minutes Intravenous  Once 05/11/21 0344 05/11/21 0346        Subjective: Seen and examined and he is improved and a little bit more awake and calmer.  No nausea or vomiting.  Not as somnolent or drowsy.  Feels okay.  Denies any complaints.  Wanting to walk.  No other concerns or complaints at this time.  Objective: Vitals:   05/15/21 0359 05/15/21 0758 05/15/21 1208 05/15/21 1210  BP: (!) 110/48 (!) 169/95 (!) 149/70 134/78  Pulse: 66 70 (!) 41 62  Resp: 19 18  20   Temp: 98.6 F (37 C)  97.7 F (36.5 C) 97.7 F (36.5 C)  TempSrc: Axillary  Oral   SpO2: 95% 98% 98% 96%  Weight:      Height:        Intake/Output Summary (Last 24 hours) at 05/15/2021 1624 Last data filed at 05/14/2021 1834 Gross per 24 hour  Intake 480 ml  Output --  Net 480 ml    Filed Weights   05/11/21 0225  Weight: 101.6 kg   Examination: Physical Exam:  Constitutional: WN/WD obese Caucasian  male currently no acute distress appears calm and comfortable and not as lethargic and little bit more awake Eyes: Lids and conjunctivae normal, sclerae anicteric  ENMT: External Ears, Nose appear normal. Grossly normal hearing.  Neck: Appears normal, supple, no cervical masses, normal ROM, no appreciable thyromegaly; no appreciable JVD Respiratory: Diminished to auscultation bilaterally, no wheezing, rales, rhonchi or crackles. Normal respiratory effort and patient is not tachypenic. No accessory muscle use.  Unlabored breathing Cardiovascular: RRR. No extremity edema. 2+ pedal pulses. No carotid bruits.  Abdomen: Soft, non-tender, distended secondary body habitus. Bowel sounds positive.  GU: Deferred. Musculoskeletal: No clubbing /  cyanosis of digits/nails. No joint deformity upper and lower extremities. Skin: No rashes, lesions, ulcers on limited skin evaluation. No induration; Warm and dry.  Neurologic: CN 2-12 grossly intact with no focal deficits. Romberg sign and cerebellar reflexes not assessed.  Psychiatric: Impaired judgment and insight. Awake and more Alert and oriented x 2. Normal mood and appropriate affect.   Data Reviewed: I have personally reviewed following labs and imaging studies  CBC: Recent Labs  Lab 05/11/21 0249 05/12/21 0514 05/13/21 0441 05/14/21 0635 05/15/21 0558  WBC 9.7 7.2 8.0 7.0 9.3  NEUTROABS 6.2  --  5.4 4.5 6.4  HGB 15.5 14.3 14.1 14.4 15.0  HCT 46.5 43.6 42.6 43.8 45.9  MCV 91.7 92.2 90.8 90.7 93.1  PLT 142* 130* 134* 131* 144*    Basic Metabolic Panel: Recent Labs  Lab 05/11/21 0249 05/12/21 0514 05/13/21 0441 05/14/21 0635 05/15/21 0558  NA 133* 139 137 136 138  K 3.6 3.0* 3.5 3.3* 4.2  CL 93* 105 103 104 106  CO2 29 26 24 24 24   GLUCOSE 112* 93 110* 112* 111*  BUN 17 12 12 11 14   CREATININE 0.94 0.77 0.79 0.77 0.90  CALCIUM 9.3 8.7* 8.6* 8.5* 9.1  MG  --  2.1 1.9 2.0 2.1  PHOS  --  3.1 2.4* 3.1 3.0    GFR: Estimated Creatinine  Clearance: 69.4 mL/min (by C-G formula based on SCr of 0.9 mg/dL). Liver Function Tests: Recent Labs  Lab 05/11/21 0249 05/13/21 0441 05/14/21 0635 05/15/21 0558  AST 16 11* 14* 21  ALT 7 8 10 17   ALKPHOS 85 70 75 89  BILITOT 1.2 1.3* 1.2 1.0  PROT 6.9 6.1* 5.9* 6.6  ALBUMIN 3.7 3.2* 3.1* 3.2*    Recent Labs  Lab 05/11/21 0249  LIPASE 24    No results for input(s): AMMONIA in the last 168 hours. Coagulation Profile: No results for input(s): INR, PROTIME in the last 168 hours. Cardiac Enzymes: No results for input(s): CKTOTAL, CKMB, CKMBINDEX, TROPONINI in the last 168 hours. BNP (last 3 results) No results for input(s): PROBNP in the last 8760 hours. HbA1C: No results for input(s): HGBA1C in the last 72 hours. CBG: Recent Labs  Lab 05/12/21 1619  GLUCAP 133*    Lipid Profile: No results for input(s): CHOL, HDL, LDLCALC, TRIG, CHOLHDL, LDLDIRECT in the last 72 hours. Thyroid Function Tests: No results for input(s): TSH, T4TOTAL, FREET4, T3FREE, THYROIDAB in the last 72 hours. Anemia Panel: No results for input(s): VITAMINB12, FOLATE, FERRITIN, TIBC, IRON, RETICCTPCT in the last 72 hours. Sepsis Labs: Recent Labs  Lab 05/11/21 0249 05/11/21 0454  PROCALCITON <0.10  --   LATICACIDVEN 2.8* 1.2     Recent Results (from the past 240 hour(s))  Resp Panel by RT-PCR (Flu A&B, Covid) Nasopharyngeal Swab     Status: None   Collection Time: 05/11/21  2:31 AM   Specimen: Nasopharyngeal Swab; Nasopharyngeal(NP) swabs in vial transport medium  Result Value Ref Range Status   SARS Coronavirus 2 by RT PCR NEGATIVE NEGATIVE Final    Comment: (NOTE) SARS-CoV-2 target nucleic acids are NOT DETECTED.  The SARS-CoV-2 RNA is generally detectable in upper respiratory specimens during the acute phase of infection. The lowest concentration of SARS-CoV-2 viral copies this assay can detect is 138 copies/mL. A negative result does not preclude SARS-Cov-2 infection and should not  be used as the sole basis for treatment or other patient management decisions. A negative result may occur with  improper specimen collection/handling,  submission of specimen other than nasopharyngeal swab, presence of viral mutation(s) within the areas targeted by this assay, and inadequate number of viral copies(<138 copies/mL). A negative result must be combined with clinical observations, patient history, and epidemiological information. The expected result is Negative.  Fact Sheet for Patients:  EntrepreneurPulse.com.au  Fact Sheet for Healthcare Providers:  IncredibleEmployment.be  This test is no t yet approved or cleared by the [place] FDA and  has been authorized for detection and/or diagnosis of SARS-CoV-2 by FDA under an Emergency Use Authorization (EUA). This EUA will remain  in effect (meaning this test can be used) for the duration of the COVID-19 declaration under Section 564(b)(1) of the Act, 21 U.S.C.section 360bbb-3(b)(1), unless the authorization is terminated  or revoked sooner.       Influenza A by PCR NEGATIVE NEGATIVE Final   Influenza B by PCR NEGATIVE NEGATIVE Final    Comment: (NOTE) The Xpert Xpress SARS-CoV-2/FLU/RSV plus assay is intended as an aid in the diagnosis of influenza from Nasopharyngeal swab specimens and should not be used as a sole basis for treatment. Nasal washings and aspirates are unacceptable for Xpert Xpress SARS-CoV-2/FLU/RSV testing.  Fact Sheet for Patients: EntrepreneurPulse.com.au  Fact Sheet for Healthcare Providers: IncredibleEmployment.be  This test is not yet approved or cleared by the [place] FDA and has been authorized for detection and/or diagnosis of SARS-CoV-2 by FDA under an Emergency Use Authorization (EUA). This EUA will remain in effect (meaning this test can be used) for the duration of the COVID-19 declaration under Section  564(b)(1) of the Act, 21 U.S.C. section 360bbb-3(b)(1), unless the authorization is terminated or revoked.  Performed at [facility], [address] [place], [place] [address]   Culture, blood (routine x 2)     Status: None (Preliminary result)   Collection Time: 05/11/21  2:49 AM   Specimen: BLOOD  Result Value Ref Range Status   Specimen Description BLOOD LEFT HAND  Final   Special Requests IN PEDIATRIC BOTTLE Blood Culture adequate volume  Final   Culture   Final    NO GROWTH 4 DAYS Performed at [facility], [address], [place], [place] [address]    Report Status PENDING  Incomplete  Culture, blood (routine x 2)     Status: None (Preliminary result)   Collection Time: 05/11/21  2:49 AM   Specimen: BLOOD  Result Value Ref Range Status   Specimen Description BLOOD LEFT ARM  Final   Special Requests IN PEDIATRIC BOTTLE Blood Culture adequate volume  Final   Culture   Final    NO GROWTH 4 DAYS Performed at [facility], [address], [place], [place] [address]    Report Status PENDING  Incomplete  Urine Culture     Status: None   Collection Time: 05/11/21  4:45 AM   Specimen: Urine, Clean Catch  Result Value Ref Range Status   Specimen Description   Final    URINE, CLEAN CATCH Performed at [facility], [address], [place], [place] [address]    Special Requests   Final    NONE Performed at [facility], [address], [place], [place] [address]    Culture   Final    NO GROWTH Performed at [facility], [place] [address], [place], [place] [address]    Report Status 05/12/2021 FINAL  Final     RN Pressure Injury Documentation: Pressure Injury 04/12/21 Coccyx Medial Stage 2 -  Partial  thickness loss of dermis presenting as a shallow open injury with a red, pink wound bed without slough. cracking mid coccyx (Active)  04/12/21 1930  Location: Coccyx  Location Orientation: Medial  Staging: Stage 2 -   Partial thickness loss of dermis presenting as a shallow open injury with a red, pink wound bed without slough.  Wound Description (Comments): cracking mid coccyx  Present on Admission:     Estimated body mass index is 36.15 kg/m as calculated from the following:   Height as of this encounter: 5\' 6"  (1.676 m).   Weight as of this encounter: 101.6 kg.  Malnutrition Type: Nutrition Problem: Inadequate oral intake Etiology: inability to eat Malnutrition Characteristics: Signs/Symptoms: NPO status Nutrition Interventions: Interventions: Ensure Enlive (each supplement provides 350kcal and 20 grams of protein), MVI  Radiology Studies: No results found.  Scheduled Meds:  apixaban  5 mg Oral BID   carbidopa-levodopa  0.5 tablet Oral QID   carvedilol  6.25 mg Oral BID WC   entacapone  200 mg Oral TID   feeding supplement  237 mL Oral BID BM   levothyroxine  50 mcg Oral Q0600   multivitamin with minerals  1 tablet Oral Daily   QUEtiapine  25 mg Oral QHS   sertraline  50 mg Oral Daily   tamsulosin  0.4 mg Oral Daily   Continuous Infusions:  dextrose 5 % and 0.9 % NaCl with KCl 20 mEq/L      LOS: 4 days   Kerney Elbe, DO [facility] PAGER is on AMION  If 7PM-7AM, please contact night-coverage [email or web address]

## 2021-05-16 LAB — CULTURE, BLOOD (ROUTINE X 2)
Culture: NO GROWTH
Culture: NO GROWTH
Special Requests: ADEQUATE
Special Requests: ADEQUATE

## 2021-05-16 LAB — COMPREHENSIVE METABOLIC PANEL
ALT: 15 U/L (ref 0–44)
AST: 19 U/L (ref 15–41)
Albumin: 3.4 g/dL — ABNORMAL LOW (ref 3.5–5.0)
Alkaline Phosphatase: 78 U/L (ref 38–126)
Anion gap: 8 (ref 5–15)
BUN: 13 mg/dL (ref 8–23)
CO2: 24 mmol/L (ref 22–32)
Calcium: 8.9 mg/dL (ref 8.9–10.3)
Chloride: 105 mmol/L (ref 98–111)
Creatinine, Ser: 0.81 mg/dL (ref 0.61–1.24)
GFR, Estimated: >60 mL/min (ref >60)
Glucose, Bld: 114 mg/dL — ABNORMAL HIGH (ref 70–99)
Potassium: 3.9 mmol/L (ref 3.5–5.1)
Sodium: 137 mmol/L (ref 135–145)
Total Bilirubin: 1.5 mg/dL — ABNORMAL HIGH (ref 0.3–1.2)
Total Protein: 6 g/dL — ABNORMAL LOW (ref 6.5–8.1)

## 2021-05-16 LAB — CBC WITH DIFFERENTIAL/PLATELET
Abs Immature Granulocytes: 0.02 10*3/uL (ref 0.00–0.07)
Basophils Absolute: 0 10*3/uL (ref 0.0–0.1)
Basophils Relative: 0 %
Eosinophils Absolute: 0.1 10*3/uL (ref 0.0–0.5)
Eosinophils Relative: 2 %
HCT: 46.2 % (ref 39.0–52.0)
Hemoglobin: 15 g/dL (ref 13.0–17.0)
Immature Granulocytes: 0 %
Lymphocytes Relative: 22 %
Lymphs Abs: 1.6 10*3/uL (ref 0.7–4.0)
MCH: 30.1 pg (ref 26.0–34.0)
MCHC: 32.5 g/dL (ref 30.0–36.0)
MCV: 92.8 fL (ref 80.0–100.0)
Monocytes Absolute: 0.7 10*3/uL (ref 0.1–1.0)
Monocytes Relative: 9 %
Neutro Abs: 4.8 10*3/uL (ref 1.7–7.7)
Neutrophils Relative %: 67 %
Platelets: 139 10*3/uL — ABNORMAL LOW (ref 150–400)
RBC: 4.98 MIL/uL (ref 4.22–5.81)
RDW: 13.6 % (ref 11.5–15.5)
WBC: 7.3 10*3/uL (ref 4.0–10.5)
nRBC: 0 % (ref 0.0–0.2)

## 2021-05-16 LAB — MAGNESIUM: Magnesium: 2.1 mg/dL (ref 1.7–2.4)

## 2021-05-16 LAB — PHOSPHORUS: Phosphorus: 3 mg/dL (ref 2.5–4.6)

## 2021-05-16 NOTE — Progress Notes (Signed)
PROGRESS NOTE    Andre [name]  GYB:[id number] DOB: 1936/03/26 DOA: 05/11/2021 PCP: [name], MD  Brief Narrative:  The patient is an 86 year old Caucasian male with a past medical history significant for but not limited to CAD status post one-vessel CABG, chronic diastolic CHF, permanent atrial fibrillation on anticoagulation with Eliquis, OSA on CPAP, history of pulmonary fibrosis, hypothyroidism, Parkinson's disease with dementia as well as other comorbidities who was brought to the ED with a 5-day history of altered mental status as well as confusion and aggressive behavior.  He was noted to track his wife prior to EMS arrival and he was hospitalized from 04/11/2021 - 04/13/2021 with acute hypoxic respiratory failure secondary to CHF exacerbation and discharged home with an increased dose of Lasix 40 mg daily.  He was in his usual state of health when he follow-up with cardiology on 04/27/2021.  History is limited due to his patient's confusion but he denies any chest pain, shortness of breath, cough, abdominal pain, nausea or vomiting.  Wife contributed most of the history as patient was lethargic from the Haldol that was given to him in the ED.  Of note he was IVC'd and then psychiatry was consulted and they recommended haloperidol 2 mg every 4 hours as needed as needed as well as a one-to-one sitter.  They discontinued his bupropion to avoid any stimulation from that medication.  Patient remains confused but he did passes bedside swallow screen and so we will place him on a soft diet and await for SLP recommendations and they screened the patient and feel he is on an appropriate diet with SOFT diet. Palliative Care Consulted and now he is DNR. PT evaluated and recommended recommending Home Health but wife cannot safely take care of the patient so he will need placement.   The day before yesterday he was much more withdrawn and somnolent and he received Haldol and Ativan overnight given his  agitation.  This is likely contributing to his fatigue and lethargy so his Ativan was discontinued.  We will continue his haloperidol.  He is improved yesterday but remains IVC.  I will reach out to psych to see if looking up to the IVC and have him help evaluate to determine his disposition.  Today he is little bit more somnolent and drowsy again.  Assessment & Plan:   Principal Problem:   Delirium Active Problems:   OSA (obstructive sleep apnea)   PAF (paroxysmal atrial fibrillation) (HCC)   Parkinson's disease ([place])   Coronary artery disease involving native coronary artery of native heart with angina pectoris ([place])   Essential hypertension   (HFpEF) heart failure with preserved ejection fraction (HCC)   Dementia with parkinsonism (HCC)   Acute metabolic encephalopathy   Acute hyperactive delirium due to another medical condition   Lactic acidosis   BPH (benign prostatic hyperplasia)   Altered mental status   Sepsis ([place])  Acute metabolic encephalopathy, improving but he is extremely somnolent today Acute hyperactive delirium due to another medical condition, improving but he was somnolent today -Etiology uncertain, improving and he is getting closer to his baseline and is much more awake and alert -Differential includes infectious versus acute delirium related to Parkinson's dementia, medication , hypoxia/hypercapnia, PE -Follow-up UA to evaluate for UTI and his urinalysis was relatively unremarkable with trace leukocytes, negative nitrates, rare bacteria, 0-5 RBCs per high-power field, 0-5 WBCs and no squamous epithelial cells -UDS was negative -Bladder scan to evaluate for urinary retention -We will get  D-dimer and proceed to CTA chest if elevated; D-dimer was slightly elevated at 0.55 so we will obtain a CT of the chest to rule out PE -CTA of the chest done and showed "No evidence of pulmonary embolus. Bilateral calcified pleural plaques. Small right pleural effusion  and trace  left pleural effusion. Cardiomegaly, coronary artery disease.   Probable fibrosis in the lower lung zones. Aortic Atherosclerosis." -We will get VBG or ABG, given recent admission for respiratory failure -VBG done and showed a pH of 7.43, PCO2 49, PO2 of less than 31.0, bicarbonate level 32.5 and an O2 saturation of 31.2 -Continue Haldol as needed -No indication for antibiotics that he is afebrile and has no white count now.  WBC is trended down from 12.6 and is now normal at 9.3 -Patient has haloperidol 2 mg IV every 6 as needed for agitation as well as ziprasidone 20 mg IM every 12 as needed agitation as well as quetiapine 25 mg p.o. nightly as well sertraline 50 mg p.o. nightly -Patient also has been ordered 2 mg of IV lorazepam every 6 as needed for anxiety but we have discontinued this -Palliative care has been consulted for goals of care discussion and he is now a DNR -Patient's mentation was improving significantly and he is getting closer to baseline.  Likely will need placement given that his wife cannot take care of him at home anymore but today he was much more fatigued and lethargic in the setting of Haldol and lorazepam administrations.  We will discontinue his lorazepam now -I have reached out to psych to reevaluate and they will come by again later today to evaluate -Patient continues to be somnolent and drowsy but is interactive today.    Lactic Acidosis with Sepsis ruled out -No strong evidence to support sepsis diagnosis -Continued IV fluids from the ED -Patient mildly tachypneic but without hypoxia -VBG as above and will obtain a CT of the chest to rule out PE however this is less likely given that he is on anticoagulation -PCT was less than 0.10   OSA (obstructive sleep apnea) -Continue with CPAP   PAF (paroxysmal atrial fibrillation) (HCC) -Chronic anticoagulation -Continue Apixaban 5 mg p.o. twice daily and carvedilol 6.25 mg p.o. twice  daily  Hyperbilirubinemia -Mild.  Patient's T bili went from 1.3 -> 1.2 -> 1.0 -> 1.5 -Continue monitor and trend and repeat CMP in a.m.  Hypophosphatemia -Patient's phosphorus level is now 3.0 x2 -Continue to monitor and replete as necessary -Repeat Phos Level in the AM    Parkinson's disease with dementia ([place]) and behavioral disturbances -Continue Sinemet 25-50 0.5 tab 4x Daily and Entacapone 200 mg po TID -Continue Seroquel but his bupropion has now been discontinued to avoid any stimulation from that medication per psychiatry -Continue with haloperidol 2 mg every 4 hours as needed  -Patient's mentation is much improved now yesterday but today he is more somnolent after Haldol and Ativan being given last night -Placed on delirium precautions   Coronary artery disease involving native coronary artery of native heart with angina pectoris (HCC) -Continue carvedilol and losartan and rosuvastatin -Currently no chest pain   (HFpEF) heart failure with preserved ejection fraction ([place]) -Appears compensated  -His carvedilol has been resumed at 6.25 mg p.o. twice daily but his losartan and his furosemide had not been resumed but will resume in the morning -Strict I's and O's and daily weights -Patient is -3.443 L since admission  Hypothyroidism -Continue with Levothyroxine 50 mcg p.o. daily  BPH -Continue Rapaflo substitution with Flomax 0.4 mg daily  Hypokalemia -Patient's K+ is now 3.9 -Check Mag Level and was 2.1 -Continue to Monitor and Replete as Necessary -Repeat CMP in the AM   Thrombocytopenia -Patient's Platelet Count went from 130 -> 134 -> 131 -> 144 -> 139 -Continue to monitor for signs or symptoms of bleeding; currently no overt bleeding noted -Repeat CBC in a.m.  Obesity -Complicates overall prognosis and care -Estimated body mass index is 36.15 kg/m as calculated from the following:   Height as of this encounter: 5\' 6"  (1.676 m).   Weight as of this  encounter: 101.6 kg.  -Weight Loss and Dietary Counseling given -Nutritionist consulted and recommending Ensure Enlive p.o. twice daily as well as multivitamin with minerals daily and feeding assistance with meals  DVT prophylaxis: Anticoagulated with apixaban 5 mg p.o. twice daily Code Status: DO NOT RESUSCITATE  Family Communication: No family currently at bedside currently but has a Actuary Disposition Plan: Pending further clinical improvement and evaluation by PT OT and they have recommended home health but wife had indicated cannot take him home safely so likely will need placement; psych is to reevaluate his IVC  Status is: Inpatient  Remains inpatient appropriate because: Still remains confused and a little agitated  Consultants:  Psychiatry Dr. Weber Cooks Palliative care medicine  Procedures: None  Antimicrobials:  Anti-infectives (From admission, onward)    Start     Dose/Rate Route Frequency Ordered Stop   05/11/21 0400  vancomycin (VANCOREADY) IVPB 2000 mg/400 mL        2,000 mg 200 mL/hr over 120 Minutes Intravenous  Once 05/11/21 0346 05/11/21 0846   05/11/21 0345  ceFEPIme (MAXIPIME) 2 g in sodium chloride 0.9 % 100 mL IVPB        2 g 200 mL/hr over 30 Minutes Intravenous  Once 05/11/21 0344 05/11/21 0503   05/11/21 0345  metroNIDAZOLE (FLAGYL) IVPB 500 mg        500 mg 100 mL/hr over 60 Minutes Intravenous  Once 05/11/21 0344 05/11/21 0616   05/11/21 0345  vancomycin (VANCOCIN) IVPB 1000 mg/200 mL premix  Status:  Discontinued        1,000 mg 200 mL/hr over 60 Minutes Intravenous  Once 05/11/21 0344 05/11/21 0346        Subjective: Seen and examined and he he is little more fatigued and somnolent but does answer questions.  Was confused today and had a very large bowel movement and was about to get cleaned up.  No other concerns or complaints at this time and no family currently at bedside.  Objective: Vitals:   05/15/21 1701 05/15/21 2018 05/16/21 0352  05/16/21 0719  BP: (!) 154/111 (!) 157/94 (!) 156/91 (!) 155/80  Pulse: 92 78 87 72  Resp: 18 20 20 16   Temp: 97.8 F (36.6 C) 97.7 F (36.5 C) 98.8 F (37.1 C)   TempSrc: Oral Oral Oral   SpO2: 98% 97% 96% 98%  Weight:      Height:        Intake/Output Summary (Last 24 hours) at 05/16/2021 1456 Last data filed at 05/16/2021 3762 Gross per 24 hour  Intake 47.69 ml  Output 1200 ml  Net -1152.31 ml    Filed Weights   05/11/21 0225  Weight: 101.6 kg   Examination: Physical Exam:  Constitutional: WN/WD obese Caucasian male who is a little more drowsy and somnolent appears comfortable though Eyes: Lids and conjunctivae normal, sclerae anicteric  ENMT: External Ears,  Nose appear normal. Grossly normal hearing. Mucous membranes are moist.   Neck: Appears normal, supple, no cervical masses, normal ROM, no appreciable thyromegaly; no appreciable JVD Respiratory: Diminished to auscultation bilaterally with coarse breath sounds, no wheezing, rales, rhonchi or crackles. Normal respiratory effort and patient is not tachypenic. No accessory muscle use.  Unlabored breathing Cardiovascular: RRR, no murmurs / rubs / gallops. S1 and S2 auscultated. No extremity edema. Abdomen: Soft, non-tender, distended secondary body habitus. Bowel sounds positive.  GU: Deferred. Musculoskeletal: No clubbing / cyanosis of digits/nails. No joint deformity upper and lower extremities.  Skin: No rashes, lesions, ulcers. No induration; Warm and dry.  Neurologic: CN 2-12 grossly intact with no focal deficits but he is again somnolent and drowsy Psychiatric: Impaired judgment and insight.  Somnolent and drowsy but he is oriented to himself  Data Reviewed: I have personally reviewed following labs and imaging studies  CBC: Recent Labs  Lab 05/11/21 0249 05/12/21 0514 05/13/21 0441 05/14/21 0635 05/15/21 0558 05/16/21 0511  WBC 9.7 7.2 8.0 7.0 9.3 7.3  NEUTROABS 6.2  --  5.4 4.5 6.4 4.8  HGB 15.5 14.3  14.1 14.4 15.0 15.0  HCT 46.5 43.6 42.6 43.8 45.9 46.2  MCV 91.7 92.2 90.8 90.7 93.1 92.8  PLT 142* 130* 134* 131* 144* 139*    Basic Metabolic Panel: Recent Labs  Lab 05/12/21 0514 05/13/21 0441 05/14/21 0635 05/15/21 0558 05/16/21 0511  NA 139 137 136 138 137  K 3.0* 3.5 3.3* 4.2 3.9  CL 105 103 104 106 105  CO2 26 24 24 24 24   GLUCOSE 93 110* 112* 111* 114*  BUN 12 12 11 14 13   CREATININE 0.77 0.79 0.77 0.90 0.81  CALCIUM 8.7* 8.6* 8.5* 9.1 8.9  MG 2.1 1.9 2.0 2.1 2.1  PHOS 3.1 2.4* 3.1 3.0 3.0    GFR: Estimated Creatinine Clearance: 77.1 mL/min (by C-G formula based on SCr of 0.81 mg/dL). Liver Function Tests: Recent Labs  Lab 05/11/21 0249 05/13/21 0441 05/14/21 0635 05/15/21 0558 05/16/21 0511  AST 16 11* 14* 21 19  ALT 7 8 10 17 15   ALKPHOS 85 70 75 89 78  BILITOT 1.2 1.3* 1.2 1.0 1.5*  PROT 6.9 6.1* 5.9* 6.6 6.0*  ALBUMIN 3.7 3.2* 3.1* 3.2* 3.4*    Recent Labs  Lab 05/11/21 0249  LIPASE 24    No results for input(s): AMMONIA in the last 168 hours. Coagulation Profile: No results for input(s): INR, PROTIME in the last 168 hours. Cardiac Enzymes: No results for input(s): CKTOTAL, CKMB, CKMBINDEX, TROPONINI in the last 168 hours. BNP (last 3 results) No results for input(s): PROBNP in the last 8760 hours. HbA1C: No results for input(s): HGBA1C in the last 72 hours. CBG: Recent Labs  Lab 05/12/21 1619  GLUCAP 133*    Lipid Profile: No results for input(s): CHOL, HDL, LDLCALC, TRIG, CHOLHDL, LDLDIRECT in the last 72 hours. Thyroid Function Tests: No results for input(s): TSH, T4TOTAL, FREET4, T3FREE, THYROIDAB in the last 72 hours. Anemia Panel: No results for input(s): VITAMINB12, FOLATE, FERRITIN, TIBC, IRON, RETICCTPCT in the last 72 hours. Sepsis Labs: Recent Labs  Lab 05/11/21 0249 05/11/21 0454  PROCALCITON <0.10  --   LATICACIDVEN 2.8* 1.2     Recent Results (from the past 240 hour(s))  Resp Panel by RT-PCR (Flu A&B, Covid)  Nasopharyngeal Swab     Status: None   Collection Time: 05/11/21  2:31 AM   Specimen: Nasopharyngeal Swab; Nasopharyngeal(NP) swabs in vial transport medium  Result Value Ref Range Status   SARS Coronavirus 2 by RT PCR NEGATIVE NEGATIVE Final    Comment: (NOTE) SARS-CoV-2 target nucleic acids are NOT DETECTED.  The SARS-CoV-2 RNA is generally detectable in upper respiratory specimens during the acute phase of infection. The lowest concentration of SARS-CoV-2 viral copies this assay can detect is 138 copies/mL. A negative result does not preclude SARS-Cov-2 infection and should not be used as the sole basis for treatment or other patient management decisions. A negative result may occur with  improper specimen collection/handling, submission of specimen other than nasopharyngeal swab, presence of viral mutation(s) within the areas targeted by this assay, and inadequate number of viral copies(<138 copies/mL). A negative result must be combined with clinical observations, patient history, and epidemiological information. The expected result is Negative.  Fact Sheet for Patients:  EntrepreneurPulse.com.au  Fact Sheet for Healthcare Providers:  IncredibleEmployment.be  This test is no t yet approved or cleared by the [place] FDA and  has been authorized for detection and/or diagnosis of SARS-CoV-2 by FDA under an Emergency Use Authorization (EUA). This EUA will remain  in effect (meaning this test can be used) for the duration of the COVID-19 declaration under Section 564(b)(1) of the Act, 21 U.S.C.section 360bbb-3(b)(1), unless the authorization is terminated  or revoked sooner.       Influenza A by PCR NEGATIVE NEGATIVE Final   Influenza B by PCR NEGATIVE NEGATIVE Final    Comment: (NOTE) The Xpert Xpress SARS-CoV-2/FLU/RSV plus assay is intended as an aid in the diagnosis of influenza from Nasopharyngeal swab specimens and should not be  used as a sole basis for treatment. Nasal washings and aspirates are unacceptable for Xpert Xpress SARS-CoV-2/FLU/RSV testing.  Fact Sheet for Patients: EntrepreneurPulse.com.au  Fact Sheet for Healthcare Providers: IncredibleEmployment.be  This test is not yet approved or cleared by the [place] FDA and has been authorized for detection and/or diagnosis of SARS-CoV-2 by FDA under an Emergency Use Authorization (EUA). This EUA will remain in effect (meaning this test can be used) for the duration of the COVID-19 declaration under Section 564(b)(1) of the Act, 21 U.S.C. section 360bbb-3(b)(1), unless the authorization is terminated or revoked.  Performed at [facility], [address] [place], [place] [address]   Culture, blood (routine x 2)     Status: None   Collection Time: 05/11/21  2:49 AM   Specimen: BLOOD  Result Value Ref Range Status   Specimen Description BLOOD LEFT HAND  Final   Special Requests IN PEDIATRIC BOTTLE Blood Culture adequate volume  Final   Culture   Final    NO GROWTH 5 DAYS Performed at [facility], [address] [place], [place] [address]    Report Status 05/16/2021 FINAL  Final  Culture, blood (routine x 2)     Status: None   Collection Time: 05/11/21  2:49 AM   Specimen: BLOOD  Result Value Ref Range Status   Specimen Description BLOOD LEFT ARM  Final   Special Requests IN PEDIATRIC BOTTLE Blood Culture adequate volume  Final   Culture   Final    NO GROWTH 5 DAYS Performed at [facility], [address] [place], [place] [address]    Report Status 05/16/2021 FINAL  Final  Urine Culture     Status: None   Collection Time: 05/11/21  4:45 AM   Specimen: Urine, Clean Catch  Result Value Ref Range Status   Specimen Description   Final  URINE, CLEAN CATCH Performed at [facility], [address] [place], [place] [address]    Special Requests   Final     NONE Performed at [facility], [address] [place], [place] [address]    Culture   Final    NO GROWTH Performed at [facility], [place] [address] [place], [place] [address]    Report Status 05/12/2021 FINAL  Final     RN Pressure Injury Documentation: Pressure Injury 04/12/21 Coccyx Medial Stage 2 -  Partial thickness loss of dermis presenting as a shallow open injury with a red, pink wound bed without slough. cracking mid coccyx (Active)  04/12/21 1930  Location: Coccyx  Location Orientation: Medial  Staging: Stage 2 -  Partial thickness loss of dermis presenting as a shallow open injury with a red, pink wound bed without slough.  Wound Description (Comments): cracking mid coccyx  Present on Admission:     Estimated body mass index is 36.15 kg/m as calculated from the following:   Height as of this encounter: 5\' 6"  (1.676 m).   Weight as of this encounter: 101.6 kg.  Malnutrition Type: Nutrition Problem: Inadequate oral intake Etiology: inability to eat Malnutrition Characteristics: Signs/Symptoms: NPO status Nutrition Interventions: Interventions: Ensure Enlive (each supplement provides 350kcal and 20 grams of protein), MVI  Radiology Studies: No results found.  Scheduled Meds:  apixaban  5 mg Oral BID   carbidopa-levodopa  0.5 tablet Oral QID   carvedilol  6.25 mg Oral BID WC   entacapone  200 mg Oral TID   feeding supplement  237 mL Oral BID BM   levothyroxine  50 mcg Oral Q0600   multivitamin with minerals  1 tablet Oral Daily   QUEtiapine  25 mg Oral QHS   sertraline  50 mg Oral Daily   tamsulosin  0.4 mg Oral Daily   Continuous Infusions:  dextrose 5 % and 0.9 % NaCl with KCl 20 mEq/L 75 mL/hr at 05/16/21 0840    LOS: 5 days   Kerney Elbe, DO [facility] PAGER is on [place]  If 7PM-7AM, please contact night-coverage [email or web address]

## 2021-05-16 NOTE — Progress Notes (Signed)
PT Cancellation Note  Patient Details Name: Andre [name] MRN: [id number] DOB: 03-07-1936   Cancelled Treatment:    Reason Eval/Treat Not Completed: Fatigue/lethargy limiting ability to participate;Other (comment)  Attempted session x 3 today.  1st pt with techs for care, 2nd pt refuses despite recently trying to get OOB with sitter, on 3rd attempt he had been given meds for agitation and lethargic.  Will continue attempts tomorrow.     Chesley Noon 05/16/2021, 1:43 PM

## 2021-05-17 ENCOUNTER — Telehealth: Payer: Self-pay | Admitting: Neurology

## 2021-05-17 ENCOUNTER — Ambulatory Visit: Payer: HMO | Admitting: Pulmonary Disease

## 2021-05-17 LAB — COMPREHENSIVE METABOLIC PANEL
ALT: 16 U/L (ref 0–44)
AST: 19 U/L (ref 15–41)
Albumin: 3.2 g/dL — ABNORMAL LOW (ref 3.5–5.0)
Alkaline Phosphatase: 87 U/L (ref 38–126)
Anion gap: 8 (ref 5–15)
BUN: 16 mg/dL (ref 8–23)
CO2: 24 mmol/L (ref 22–32)
Calcium: 8.8 mg/dL — ABNORMAL LOW (ref 8.9–10.3)
Chloride: 105 mmol/L (ref 98–111)
Creatinine, Ser: 0.86 mg/dL (ref 0.61–1.24)
GFR, Estimated: >60 mL/min (ref >60)
Glucose, Bld: 104 mg/dL — ABNORMAL HIGH (ref 70–99)
Potassium: 3.9 mmol/L (ref 3.5–5.1)
Sodium: 137 mmol/L (ref 135–145)
Total Bilirubin: 1 mg/dL (ref 0.3–1.2)
Total Protein: 6.3 g/dL — ABNORMAL LOW (ref 6.5–8.1)

## 2021-05-17 LAB — CBC WITH DIFFERENTIAL/PLATELET
Abs Immature Granulocytes: 0.02 10*3/uL (ref 0.00–0.07)
Basophils Absolute: 0.1 10*3/uL (ref 0.0–0.1)
Basophils Relative: 1 %
Eosinophils Absolute: 0.2 10*3/uL (ref 0.0–0.5)
Eosinophils Relative: 2 %
HCT: 46.5 % (ref 39.0–52.0)
Hemoglobin: 15.2 g/dL (ref 13.0–17.0)
Immature Granulocytes: 0 %
Lymphocytes Relative: 24 %
Lymphs Abs: 1.9 10*3/uL (ref 0.7–4.0)
MCH: 29.9 pg (ref 26.0–34.0)
MCHC: 32.7 g/dL (ref 30.0–36.0)
MCV: 91.5 fL (ref 80.0–100.0)
Monocytes Absolute: 0.8 10*3/uL (ref 0.1–1.0)
Monocytes Relative: 9 %
Neutro Abs: 5.3 10*3/uL (ref 1.7–7.7)
Neutrophils Relative %: 64 %
Platelets: 153 10*3/uL (ref 150–400)
RBC: 5.08 MIL/uL (ref 4.22–5.81)
RDW: 14 % (ref 11.5–15.5)
WBC: 8.2 10*3/uL (ref 4.0–10.5)
nRBC: 0 % (ref 0.0–0.2)

## 2021-05-17 LAB — MAGNESIUM: Magnesium: 2.2 mg/dL (ref 1.7–2.4)

## 2021-05-17 LAB — PHOSPHORUS: Phosphorus: 3.5 mg/dL (ref 2.5–4.6)

## 2021-05-17 MED ORDER — LORAZEPAM 2 MG/ML IJ SOLN
0.5000 mg | Freq: Once | INTRAMUSCULAR | Status: AC
  Administered 2021-05-17: 0.5 mg via INTRAVENOUS
  Filled 2021-05-17: qty 1

## 2021-05-17 MED ORDER — SODIUM CHLORIDE 0.9 % IV BOLUS
500.0000 mL | Freq: Once | INTRAVENOUS | Status: AC
  Administered 2021-05-17: 500 mL via INTRAVENOUS

## 2021-05-17 NOTE — TOC Progression Note (Signed)
Transition of Care [facility] - Progression Note    Patient Details  Name: Andre [name] MRN: [id number] Date of Birth: [birth year]  [facility]) CM/SW [place], RN Phone Number: 05/17/2021, 3:42 PM  Clinical Narrative:   Psych department to come to see patient for follow up.    Spouse continues to state she is fearful of taking patient home due to his behavior.  She understands it is from his disease process, but does not feel she can safely care for him.  Spouse is speaking with Nanine Means today to obtain information about the possibility of placement.  She will follow up with [facility] when she returns, or tomorrow.    TOC to follow.         Expected Discharge Plan and Services                                                 Social Determinants of Health (SDOH) Interventions    Readmission Risk Interventions No flowsheet data found.

## 2021-05-17 NOTE — Telephone Encounter (Signed)
Pt's spouse called wanting to speak to the Provider or RN regarding his hospital admission. Please advise.

## 2021-05-17 NOTE — Progress Notes (Signed)
PT Cancellation Note  Patient Details Name: Andre [name] MRN: [id number] DOB: 1935-10-19   Cancelled Treatment:    Reason Eval/Treat Not Completed: Fatigue/lethargy limiting ability to participate. Patient is groggy and lethargic this pm. Declining oob mobility when he is alert. Wife and sitter present in room. Sitter states she walked with him a little bit this am. Will continue to monitor and work with as able.    Theoren Palka 05/17/2021, 2:54 PM

## 2021-05-17 NOTE — Progress Notes (Signed)
PROGRESS NOTE    Andre [name]  KGM:[id number] DOB: 05-17-1935 DOA: 05/11/2021 PCP: [name], MD  Brief Narrative:  The patient is an 86 year old Caucasian male with a past medical history significant for but not limited to CAD status post one-vessel CABG, chronic diastolic CHF, permanent atrial fibrillation on anticoagulation with Eliquis, OSA on CPAP, history of pulmonary fibrosis, hypothyroidism, Parkinson's disease with dementia as well as other comorbidities who was brought to the ED with a 5-day history of altered mental status as well as confusion and aggressive behavior.  He was noted to track his wife prior to EMS arrival and he was hospitalized from 04/11/2021 - 04/13/2021 with acute hypoxic respiratory failure secondary to CHF exacerbation and discharged home with an increased dose of Lasix 40 mg daily.  He was in his usual state of health when he follow-up with cardiology on 04/27/2021.  History is limited due to his patient's confusion but he denies any chest pain, shortness of breath, cough, abdominal pain, nausea or vomiting.  Wife contributed most of the history as patient was lethargic from the Haldol that was given to him in the ED.  Of note he was IVC'd and then psychiatry was consulted and they recommended haloperidol 2 mg every 4 hours as needed as needed as well as a one-to-one sitter.  They discontinued his bupropion to avoid any stimulation from that medication.  Patient remains confused but he did passes bedside swallow screen and so we will place him on a soft diet and await for SLP recommendations and they screened the patient and feel he is on an appropriate diet with SOFT diet. Palliative Care Consulted and now he is DNR. PT evaluated and recommended recommending Home Health but wife cannot safely take care of the patient so he will need placement.   Patient has been intermittently somnolent and withdrawn in the setting of Haldol and Ativan.  He is much more awake and  alert today.  Psych was re-consulted for IVC evaluation they have rescinded his IVC and the psychiatrist noted patient is no longer suicidal as he was more upbeat PT OT evaluated him and he was fatigued and groggy again and was declining out of bed mobility when he was alert.  TOC consulted for assistance with disposition and wife is considering obtaining possible long-term placement for the patient at [facility] but is hesitant to take him home given that she feels that she cannot safely take care of him.  Assessment & Plan:   Principal Problem:   Delirium Active Problems:   OSA (obstructive sleep apnea)   PAF (paroxysmal atrial fibrillation) (HCC)   Parkinson's disease ([place])   Coronary artery disease involving native coronary artery of native heart with angina pectoris ([place])   Essential hypertension   (HFpEF) heart failure with preserved ejection fraction (HCC)   Dementia with parkinsonism (HCC)   Acute metabolic encephalopathy   Acute hyperactive delirium due to another medical condition   Lactic acidosis   BPH (benign prostatic hyperplasia)   Altered mental status   Sepsis ([place])  Acute metabolic encephalopathy, improving but he has been intermittently somnolent Acute hyperactive delirium due to another medical condition, improving but continues to be intermittently somnolent -Etiology uncertain, improving and he is getting closer to his baseline and is much more awake and alert -Differential includes infectious versus acute delirium related to Parkinson's dementia, medication , hypoxia/hypercapnia, PE -Follow-up UA to evaluate for UTI and his urinalysis was relatively unremarkable with trace leukocytes, negative nitrates, rare  bacteria, 0-5 RBCs per high-power field, 0-5 WBCs and no squamous epithelial cells -UDS was negative -Bladder scan to evaluate for urinary retention -We will get D-dimer and proceed to CTA chest if elevated; D-dimer was slightly elevated at 0.55 so we will obtain  a CT of the chest to rule out PE -CTA of the chest done and showed "No evidence of pulmonary embolus. Bilateral calcified pleural plaques. Small right pleural effusion and trace left pleural effusion. Cardiomegaly, coronary artery disease. Probable fibrosis in the lower lung zones. Aortic Atherosclerosis." -We will get VBG or ABG, given recent admission for respiratory failure -VBG done and showed a pH of 7.43, PCO2 49, PO2 of less than 31.0, bicarbonate level 32.5 and an O2 saturation of 31.2 -Continue Haldol as needed -No indication for antibiotics that he is afebrile and has no white count now.  WBC is trended down from 12.6 and is now normal at 9.3 -Patient has haloperidol 2 mg IV every 6 as needed for agitation as well as ziprasidone 20 mg IM every 12 as needed agitation as well as quetiapine 25 mg p.o. nightly as well sertraline 50 mg p.o. nightly -Patient also has been ordered 2 mg of IV lorazepam every 6 as needed for anxiety but we have discontinued this -Palliative care has been consulted for goals of care discussion and he is now a DNR -Patient's mentation was improving significantly and he is getting closer to baseline.  Likely will need placement given that his wife cannot take care of him at home anymore but today he was much more fatigued and lethargic in the setting of Haldol and lorazepam administrations.  We will discontinue his lorazepam now -Psych reevaluated and has pursued his IVC -Patient was more interactive today but was a little restless    Lactic Acidosis with Sepsis ruled out -No strong evidence to support sepsis diagnosis -Continued IV fluids from the ED -Patient mildly tachypneic but without hypoxia -VBG as above and will obtain a CT of the chest to rule out PE however this is less likely given that he is on anticoagulation -PCT was less than 0.10   OSA (obstructive sleep apnea) -Continue with CPAP   PAF (paroxysmal atrial fibrillation) (HCC) -Chronic  anticoagulation -Continue Apixaban 5 mg p.o. twice daily and carvedilol 6.25 mg p.o. twice daily  Hyperbilirubinemia -Mild.  Patient's T bili went from 1.3 -> 1.2 -> 1.0 -> 1.5 and is improved to 1.0 -Continue monitor and trend and repeat CMP in a.m.  Hypophosphatemia -Patient's phosphorus level is now 3.0 x2 and is now 3.5 -Continue to monitor and replete as necessary -Repeat Phos Level in the AM    Parkinson's disease with dementia ([place]) and behavioral disturbances -Continue Sinemet 25-50 0.5 tab 4x Daily and Entacapone 200 mg po TID -Continue Seroquel but his bupropion has now been discontinued to avoid any stimulation from that medication per psychiatry -Continue with haloperidol 2 mg every 4 hours as needed  -Patient's mentation is much improved now yesterday but today he is more somnolent after Haldol and Ativan being given last night -Placed on delirium precautions -He will need to follow-up with neurology in outpatient setting for further medication adjustments and evaluation   Coronary artery disease involving native coronary artery of native heart with angina pectoris (HCC) -Continue carvedilol and losartan and rosuvastatin -Currently no chest pain   (HFpEF) heart failure with preserved ejection fraction ([place]) -Appears compensated  -His carvedilol has been resumed at 6.25 mg p.o. twice daily but his losartan  and his furosemide had not been resumed but will resume in the morning -Strict I's and O's and daily weights -Patient is - 2.458 L since admission  Hypothyroidism -Continue with Levothyroxine 50 mcg p.o. daily   BPH -Continue Rapaflo substitution with Flomax 0.4 mg daily  Hypokalemia -Patient's K+ is now 3.9 -Check Mag Level and was 2.2 -Continue to Monitor and Replete as Necessary -Repeat CMP in the AM   Thrombocytopenia, improved -Patient's Platelet Count went from 130 -> 134 -> 131 -> 144 -> 139 and is now 153 -Continue to monitor for signs or symptoms of  bleeding; currently no overt bleeding noted -Repeat CBC in a.m.  Obesity -Complicates overall prognosis and care -Estimated body mass index is 36.15 kg/m as calculated from the following:   Height as of this encounter: 5\' 6"  (1.676 m).   Weight as of this encounter: 101.6 kg.  -Weight Loss and Dietary Counseling given -Nutritionist consulted and recommending Ensure Enlive p.o. twice daily as well as multivitamin with minerals daily and feeding assistance with meals  DVT prophylaxis: Anticoagulated with apixaban 5 mg p.o. twice daily Code Status: DO NOT RESUSCITATE  Family Communication: No family currently at bedside currently but has a Actuary Disposition Plan: Pending further clinical improvement and evaluation by PT OT and they have recommended home health but wife had indicated cannot take him home safely so likely will need placement; psych reevaluate his IVC and have rescinded it now  Status is: Inpatient  Remains inpatient appropriate because: Still remains confused and a little agitated  Consultants:  Psychiatry Dr. Weber Cooks Palliative care medicine  Procedures: None  Antimicrobials:  Anti-infectives (From admission, onward)    Start     Dose/Rate Route Frequency Ordered Stop   05/11/21 0400  vancomycin (VANCOREADY) IVPB 2000 mg/400 mL        2,000 mg 200 mL/hr over 120 Minutes Intravenous  Once 05/11/21 0346 05/11/21 0846   05/11/21 0345  ceFEPIme (MAXIPIME) 2 g in sodium chloride 0.9 % 100 mL IVPB        2 g 200 mL/hr over 30 Minutes Intravenous  Once 05/11/21 0344 05/11/21 0503   05/11/21 0345  metroNIDAZOLE (FLAGYL) IVPB 500 mg        500 mg 100 mL/hr over 60 Minutes Intravenous  Once 05/11/21 0344 05/11/21 0616   05/11/21 0345  vancomycin (VANCOCIN) IVPB 1000 mg/200 mL premix  Status:  Discontinued        1,000 mg 200 mL/hr over 60 Minutes Intravenous  Once 05/11/21 0344 05/11/21 0346        Subjective: Seen and examined and he is more awake and alert and a  little restless.  Sitting in the chair at bedside and states that he cannot get comfortable.  No nausea or vomiting.  Denies any lightheadedness or dizziness.  Wanting to walk.  No other concerns or complaints this time.  Objective: Vitals:   05/17/21 0512 05/17/21 0755 05/17/21 0929 05/17/21 1147  BP: 123/60 136/67 (!) 88/59 131/68  Pulse: 78 79 84 (!) 43  Resp: 15 16 16 16   Temp: 98.7 F (37.1 C) 97.8 F (36.6 C)    TempSrc: Oral Oral    SpO2: 97% 99% 97% 98%  Weight:      Height:        Intake/Output Summary (Last 24 hours) at 05/17/2021 1635 Last data filed at 05/17/2021 1500 Gross per 24 hour  Intake 1784.67 ml  Output 800 ml  Net 984.67 ml  Filed Weights   05/11/21 0225  Weight: 101.6 kg   Examination: Physical Exam:  Constitutional: WN/WD obese Caucasian male currently in no acute distress was sitting up in the chair bedside and does appear a little anxious and restless though Eyes: Lids and conjunctivae normal, sclerae anicteric  ENMT: External Ears, Nose appear normal. Grossly normal hearing. Mucous membranes are moist.   Neck: Appears normal, supple, no cervical masses, normal ROM, no appreciable thyromegaly; no appreciable JVD Respiratory: Diminished to auscultation bilaterally with coarse breath sounds, no wheezing, rales, rhonchi or crackles. Normal respiratory effort and patient is not tachypenic. Unlabored breathing Cardiovascular: RRR, no murmurs / rubs / gallops. Mild extremity edema.  Abdomen: Soft, non-tender, distended secondary body habitus. Bowel sounds positive.  GU: Deferred. Musculoskeletal: No clubbing / cyanosis of digits/nails. No joint deformity upper and lower extremities.  Skin: No rashes, lesions, ulcers on limited skin evaluation. No induration; Warm and dry.  Neurologic: CN 2-12 grossly intact with no focal deficits. Romberg sign and cerebellar reflexes not assessed.  Psychiatric: Normal judgment and insight. Alert and awake oriented x 3.  Normal mood and appropriate affect.   Data Reviewed: I have personally reviewed following labs and imaging studies  CBC: Recent Labs  Lab 05/13/21 0441 05/14/21 0635 05/15/21 0558 05/16/21 0511 05/17/21 0635  WBC 8.0 7.0 9.3 7.3 8.2  NEUTROABS 5.4 4.5 6.4 4.8 5.3  HGB 14.1 14.4 15.0 15.0 15.2  HCT 42.6 43.8 45.9 46.2 46.5  MCV 90.8 90.7 93.1 92.8 91.5  PLT 134* 131* 144* 139* 767    Basic Metabolic Panel: Recent Labs  Lab 05/13/21 0441 05/14/21 0635 05/15/21 0558 05/16/21 0511 05/17/21 0635  NA 137 136 138 137 137  K 3.5 3.3* 4.2 3.9 3.9  CL 103 104 106 105 105  CO2 24 24 24 24 24   GLUCOSE 110* 112* 111* 114* 104*  BUN 12 11 14 13 16   CREATININE 0.79 0.77 0.90 0.81 0.86  CALCIUM 8.6* 8.5* 9.1 8.9 8.8*  MG 1.9 2.0 2.1 2.1 2.2  PHOS 2.4* 3.1 3.0 3.0 3.5    GFR: Estimated Creatinine Clearance: 72.6 mL/min (by C-G formula based on SCr of 0.86 mg/dL). Liver Function Tests: Recent Labs  Lab 05/13/21 0441 05/14/21 0635 05/15/21 0558 05/16/21 0511 05/17/21 0635  AST 11* 14* 21 19 19   ALT 8 10 17 15 16   ALKPHOS 70 75 89 78 87  BILITOT 1.3* 1.2 1.0 1.5* 1.0  PROT 6.1* 5.9* 6.6 6.0* 6.3*  ALBUMIN 3.2* 3.1* 3.2* 3.4* 3.2*    Recent Labs  Lab 05/11/21 0249  LIPASE 24    No results for input(s): AMMONIA in the last 168 hours. Coagulation Profile: No results for input(s): INR, PROTIME in the last 168 hours. Cardiac Enzymes: No results for input(s): CKTOTAL, CKMB, CKMBINDEX, TROPONINI in the last 168 hours. BNP (last 3 results) No results for input(s): PROBNP in the last 8760 hours. HbA1C: No results for input(s): HGBA1C in the last 72 hours. CBG: Recent Labs  Lab 05/12/21 1619  GLUCAP 133*    Lipid Profile: No results for input(s): CHOL, HDL, LDLCALC, TRIG, CHOLHDL, LDLDIRECT in the last 72 hours. Thyroid Function Tests: No results for input(s): TSH, T4TOTAL, FREET4, T3FREE, THYROIDAB in the last 72 hours. Anemia Panel: No results for input(s):  VITAMINB12, FOLATE, FERRITIN, TIBC, IRON, RETICCTPCT in the last 72 hours. Sepsis Labs: Recent Labs  Lab 05/11/21 0249 05/11/21 0454  PROCALCITON <0.10  --   LATICACIDVEN 2.8* 1.2     Recent  Results (from the past 240 hour(s))  Resp Panel by RT-PCR (Flu A&B, Covid) Nasopharyngeal Swab     Status: None   Collection Time: 05/11/21  2:31 AM   Specimen: Nasopharyngeal Swab; Nasopharyngeal(NP) swabs in vial transport medium  Result Value Ref Range Status   SARS Coronavirus 2 by RT PCR NEGATIVE NEGATIVE Final    Comment: (NOTE) SARS-CoV-2 target nucleic acids are NOT DETECTED.  The SARS-CoV-2 RNA is generally detectable in upper respiratory specimens during the acute phase of infection. The lowest concentration of SARS-CoV-2 viral copies this assay can detect is 138 copies/mL. A negative result does not preclude SARS-Cov-2 infection and should not be used as the sole basis for treatment or other patient management decisions. A negative result may occur with  improper specimen collection/handling, submission of specimen other than nasopharyngeal swab, presence of viral mutation(s) within the areas targeted by this assay, and inadequate number of viral copies(<138 copies/mL). A negative result must be combined with clinical observations, patient history, and epidemiological information. The expected result is Negative.  Fact Sheet for Patients:  EntrepreneurPulse.com.au  Fact Sheet for Healthcare Providers:  IncredibleEmployment.be  This test is no t yet approved or cleared by the [place] FDA and  has been authorized for detection and/or diagnosis of SARS-CoV-2 by FDA under an Emergency Use Authorization (EUA). This EUA will remain  in effect (meaning this test can be used) for the duration of the COVID-19 declaration under Section 564(b)(1) of the Act, 21 U.S.C.section 360bbb-3(b)(1), unless the authorization is terminated  or revoked  sooner.       Influenza A by PCR NEGATIVE NEGATIVE Final   Influenza B by PCR NEGATIVE NEGATIVE Final    Comment: (NOTE) The Xpert Xpress SARS-CoV-2/FLU/RSV plus assay is intended as an aid in the diagnosis of influenza from Nasopharyngeal swab specimens and should not be used as a sole basis for treatment. Nasal washings and aspirates are unacceptable for Xpert Xpress SARS-CoV-2/FLU/RSV testing.  Fact Sheet for Patients: EntrepreneurPulse.com.au  Fact Sheet for Healthcare Providers: IncredibleEmployment.be  This test is not yet approved or cleared by the [place] FDA and has been authorized for detection and/or diagnosis of SARS-CoV-2 by FDA under an Emergency Use Authorization (EUA). This EUA will remain in effect (meaning this test can be used) for the duration of the COVID-19 declaration under Section 564(b)(1) of the Act, 21 U.S.C. section 360bbb-3(b)(1), unless the authorization is terminated or revoked.  Performed at [facility], [address] [place], [place] [address]   Culture, blood (routine x 2)     Status: None   Collection Time: 05/11/21  2:49 AM   Specimen: BLOOD  Result Value Ref Range Status   Specimen Description BLOOD LEFT HAND  Final   Special Requests IN PEDIATRIC BOTTLE Blood Culture adequate volume  Final   Culture   Final    NO GROWTH 5 DAYS Performed at [facility], [address], [place], [place] [address]    Report Status 05/16/2021 FINAL  Final  Culture, blood (routine x 2)     Status: None   Collection Time: 05/11/21  2:49 AM   Specimen: BLOOD  Result Value Ref Range Status   Specimen Description BLOOD LEFT ARM  Final   Special Requests IN PEDIATRIC BOTTLE Blood Culture adequate volume  Final   Culture   Final    NO GROWTH 5 DAYS Performed at [facility], [address], [place], [place] [address]    Report Status 05/16/2021  FINAL  Final  Urine Culture      Status: None   Collection Time: 05/11/21  4:45 AM   Specimen: Urine, Clean Catch  Result Value Ref Range Status   Specimen Description   Final    URINE, CLEAN CATCH Performed at [facility], [address], [place], [place] [address]    Special Requests   Final    NONE Performed at [facility], [address] [place], [place] [address]    Culture   Final    NO GROWTH Performed at [facility], [place] [address] [place], [place] [address]    Report Status 05/12/2021 FINAL  Final     RN Pressure Injury Documentation: Pressure Injury 04/12/21 Coccyx Medial Stage 2 -  Partial thickness loss of dermis presenting as a shallow open injury with a red, pink wound bed without slough. cracking mid coccyx (Active)  04/12/21 1930  Location: Coccyx  Location Orientation: Medial  Staging: Stage 2 -  Partial thickness loss of dermis presenting as a shallow open injury with a red, pink wound bed without slough.  Wound Description (Comments): cracking mid coccyx  Present on Admission:     Estimated body mass index is 36.15 kg/m as calculated from the following:   Height as of this encounter: 5\' 6"  (1.676 m).   Weight as of this encounter: 101.6 kg.  Malnutrition Type: Nutrition Problem: Inadequate oral intake Etiology: inability to eat Malnutrition Characteristics: Signs/Symptoms: NPO status Nutrition Interventions: Interventions: Ensure Enlive (each supplement provides 350kcal and 20 grams of protein), MVI  Radiology Studies: No results found.  Scheduled Meds:  apixaban  5 mg Oral BID   carbidopa-levodopa  0.5 tablet Oral QID   carvedilol  6.25 mg Oral BID WC   entacapone  200 mg Oral TID   feeding supplement  237 mL Oral BID BM   levothyroxine  50 mcg Oral Q0600   multivitamin with minerals  1 tablet Oral Daily   QUEtiapine  25 mg Oral QHS   sertraline  50 mg Oral Daily   tamsulosin  0.4 mg Oral Daily   Continuous Infusions:  dextrose 5 % and 0.9 %  NaCl with KCl 20 mEq/L 75 mL/hr at 05/17/21 0414    LOS: 6 days   Kerney Elbe, DO [facility] PAGER is on [place]  If 7PM-7AM, please contact night-coverage [email or web address]

## 2021-05-18 ENCOUNTER — Ambulatory Visit: Payer: HMO | Admitting: Neurology

## 2021-05-18 LAB — CBC WITH DIFFERENTIAL/PLATELET
Abs Immature Granulocytes: 0.02 10*3/uL (ref 0.00–0.07)
Basophils Absolute: 0 10*3/uL (ref 0.0–0.1)
Basophils Relative: 1 %
Eosinophils Absolute: 0.2 10*3/uL (ref 0.0–0.5)
Eosinophils Relative: 2 %
HCT: 45 % (ref 39.0–52.0)
Hemoglobin: 14.4 g/dL (ref 13.0–17.0)
Immature Granulocytes: 0 %
Lymphocytes Relative: 25 %
Lymphs Abs: 2.1 10*3/uL (ref 0.7–4.0)
MCH: 30.3 pg (ref 26.0–34.0)
MCHC: 32 g/dL (ref 30.0–36.0)
MCV: 94.7 fL (ref 80.0–100.0)
Monocytes Absolute: 0.7 10*3/uL (ref 0.1–1.0)
Monocytes Relative: 8 %
Neutro Abs: 5.4 10*3/uL (ref 1.7–7.7)
Neutrophils Relative %: 64 %
Platelets: 147 10*3/uL — ABNORMAL LOW (ref 150–400)
RBC: 4.75 MIL/uL (ref 4.22–5.81)
RDW: 14.1 % (ref 11.5–15.5)
WBC: 8.5 10*3/uL (ref 4.0–10.5)
nRBC: 0 % (ref 0.0–0.2)

## 2021-05-18 LAB — COMPREHENSIVE METABOLIC PANEL
ALT: 10 U/L (ref 0–44)
AST: 18 U/L (ref 15–41)
Albumin: 3.3 g/dL — ABNORMAL LOW (ref 3.5–5.0)
Alkaline Phosphatase: 82 U/L (ref 38–126)
Anion gap: 8 (ref 5–15)
BUN: 17 mg/dL (ref 8–23)
CO2: 22 mmol/L (ref 22–32)
Calcium: 8.6 mg/dL — ABNORMAL LOW (ref 8.9–10.3)
Chloride: 105 mmol/L (ref 98–111)
Creatinine, Ser: 0.85 mg/dL (ref 0.61–1.24)
GFR, Estimated: >60 mL/min (ref >60)
Glucose, Bld: 106 mg/dL — ABNORMAL HIGH (ref 70–99)
Potassium: 4.1 mmol/L (ref 3.5–5.1)
Sodium: 135 mmol/L (ref 135–145)
Total Bilirubin: 0.8 mg/dL (ref 0.3–1.2)
Total Protein: 6.3 g/dL — ABNORMAL LOW (ref 6.5–8.1)

## 2021-05-18 LAB — PHOSPHORUS: Phosphorus: 3.5 mg/dL (ref 2.5–4.6)

## 2021-05-18 LAB — MAGNESIUM: Magnesium: 2.3 mg/dL (ref 1.7–2.4)

## 2021-05-18 MED ORDER — ACETAMINOPHEN 325 MG PO TABS
650.0000 mg | ORAL_TABLET | Freq: Once | ORAL | Status: AC
  Administered 2021-05-18: 06:00:00 650 mg via ORAL
  Filled 2021-05-18: qty 2

## 2021-05-18 NOTE — Telephone Encounter (Signed)
Wife called.  Pt is in hospital.  She had questions about pt not taking the sinemet for 2 wks would have caused the delirium.  I told her that I did not think so.  She also asked about if increasing the sinemet dosing if that would get him back to where he was?  I relayed that would be glad to ask Dr. Rexene Alberts her recommendation.  Since he is still in the hospital that they would be making recommendations for him.  Once he is home or in facility (gave the [facility] senior resource option to call).  She has no LTC insurance.  Option was $8000/month and she cannot afford that.  I said that visiting other sites may assist in giving them information and then questions to ask.  She verbalized understanding.

## 2021-05-18 NOTE — Progress Notes (Signed)
PROGRESS NOTE    Andre [name]  ZOX:[id number] DOB: 16-Jun-1935 DOA: 05/11/2021 PCP: [name], MD  Brief Narrative:  The patient is an 86 year old Caucasian male with a past medical history significant for but not limited to CAD status post one-vessel CABG, chronic diastolic CHF, permanent atrial fibrillation on anticoagulation with Eliquis, OSA on CPAP, history of pulmonary fibrosis, hypothyroidism, Parkinson's disease with dementia as well as other comorbidities who was brought to the ED with a 5-day history of altered mental status as well as confusion and aggressive behavior.  He was noted to track his wife prior to EMS arrival and he was hospitalized from 04/11/2021 - 04/13/2021 with acute hypoxic respiratory failure secondary to CHF exacerbation and discharged home with an increased dose of Lasix 40 mg daily.  He was in his usual state of health when he follow-up with cardiology on 04/27/2021.  History is limited due to his patient's confusion but he denies any chest pain, shortness of breath, cough, abdominal pain, nausea or vomiting.  Wife contributed most of the history as patient was lethargic from the Haldol that was given to him in the ED.  Of note he was IVC'd and then psychiatry was consulted and they recommended haloperidol 2 mg every 4 hours as needed as needed as well as a one-to-one sitter.  They discontinued his bupropion to avoid any stimulation from that medication.  Patient remains confused but he did passes bedside swallow screen and so we will place him on a soft diet and await for SLP recommendations and they screened the patient and feel he is on an appropriate diet with SOFT diet. Palliative Care Consulted and now he is DNR. PT evaluated and recommended recommending Home Health but wife cannot safely take care of the patient so he will need placement.   Patient has been intermittently somnolent and withdrawn in the setting of Haldol and Ativan.  He is much more awake and  alert today.  Psych was re-consulted for IVC evaluation they have rescinded his IVC and the psychiatrist noted patient is no longer suicidal as he was more upbeat PT OT evaluated him and he was fatigued and groggy again and was declining out of bed mobility when he was alert.  TOC consulted for assistance with disposition and wife is considering obtaining possible long-term placement for the patient at [facility] but is hesitant to take him home given that she feels that she cannot safely take care of him.  He is much more improved today but wife and daughter states that he still hallucinating so we will consult neurology.  Psychiatry has signed off the case and rescinded his IVC  Assessment & Plan:   Principal Problem:   Delirium Active Problems:   OSA (obstructive sleep apnea)   PAF (paroxysmal atrial fibrillation) (HCC)   Parkinson's disease ([place])   Coronary artery disease involving native coronary artery of native heart with angina pectoris ([place])   Essential hypertension   (HFpEF) heart failure with preserved ejection fraction (HCC)   Dementia with parkinsonism ([place])   Acute metabolic encephalopathy   Acute hyperactive delirium due to another medical condition   Lactic acidosis   BPH (benign prostatic hyperplasia)   Altered mental status   Sepsis ([place])  Acute metabolic encephalopathy, improving but he has been intermittently somnolent Acute hyperactive delirium due to another medical condition with background Parkinson's Disease, improving but continues to be intermittently somnolent -Etiology uncertain, improving and he is getting closer to his baseline and is much  more awake and alert -Differential includes infectious versus acute delirium related to Parkinson's dementia, medication , hypoxia/hypercapnia, PE -Follow-up UA to evaluate for UTI and his urinalysis was relatively unremarkable with trace leukocytes, negative nitrates, rare bacteria, 0-5 RBCs per high-power field, 0-5 WBCs  and no squamous epithelial cells -UDS was negative -Bladder scan to evaluate for urinary retention -We will get D-dimer and proceed to CTA chest if elevated; D-dimer was slightly elevated at 0.55 so we will obtain a CT of the chest to rule out PE -CTA of the chest done and showed "No evidence of pulmonary embolus. Bilateral calcified pleural plaques. Small right pleural effusion and trace left pleural effusion. Cardiomegaly, coronary artery disease. Probable fibrosis in the lower lung zones. Aortic Atherosclerosis." -We will get VBG or ABG, given recent admission for respiratory failure -VBG done and showed a pH of 7.43, PCO2 49, PO2 of less than 31.0, bicarbonate level 32.5 and an O2 saturation of 31.2 -Continue Haldol as needed -No indication for antibiotics that he is afebrile and has no white count now.  WBC is trended down from 12.6 and is now normal at 9.3 -Patient has haloperidol 2 mg IV every 6 as needed for agitation as well as Ziprasidone 20 mg IM every 12 as needed agitation as well as quetiapine 25 mg p.o. nightly as well sertraline 50 mg p.o. nightly -Patient also has been ordered 2 mg of IV lorazepam every 6 as needed for anxiety but we have discontinued this -Palliative care has been consulted for goals of care discussion and he is now a DNR -Patient's mentation was improving significantly and he is getting closer to baseline.  Likely will need placement given that his wife cannot take care of him at home anymore but today he was much more fatigued and lethargic in the setting of Haldol and lorazepam administrations.  We will discontinue his lorazepam now -Psych reevaluated and has rescinded his IVC -Patient was more interactive today but wife and daughter felt as if he was still hallucinating  -Will consult Neurology and Dr. Quinn Axe to see the patient for further evaluation and recommendations    Lactic Acidosis with Sepsis ruled out -No strong evidence to support sepsis  diagnosis -Continued IV fluids from the ED -Patient mildly tachypneic but without hypoxia -VBG as above and will obtain a CT of the chest to rule out PE however this is less likely given that he is on anticoagulation -PCT was less than 0.10   OSA (obstructive sleep apnea) -Continue with CPAP   PAF (paroxysmal atrial fibrillation) (HCC) -Chronic anticoagulation -Continue Apixaban 5 mg p.o. twice daily and carvedilol 6.25 mg p.o. twice daily  Hyperbilirubinemia -Mild.  Patient's T bili went from 1.3 -> 1.2 -> 1.0 -> 1.5 -> 1.0 -> 0.8 -Continue monitor and trend and repeat CMP in a.m.  Hypophosphatemia -Patient's phosphorus level is now 3.0 x2 and is now 3.5 x2 -Continue to monitor and replete as necessary -Repeat Phos Level in the AM    Parkinson's disease with dementia ([place]) and behavioral disturbances -Continue Sinemet 25-50 0.5 tab 4x Daily and Entacapone 200 mg po TID -Continue Seroquel but his bupropion has now been discontinued to avoid any stimulation from that medication per psychiatry -Continue with haloperidol 2 mg every 4 hours as needed  -Patient's mentation is much improved now yesterday but today he is more somnolent after Haldol and Ativan being given last night -Placed on delirium precautions -Have consulted Neurology for further evaluation   Coronary artery disease  involving native coronary artery of native heart with angina pectoris (HCC) -Continue carvedilol and losartan and rosuvastatin -Currently no chest pain   (HFpEF) heart failure with preserved ejection fraction (HCC) -Appears compensated  -His carvedilol has been resumed at 6.25 mg p.o. twice daily but his losartan and his furosemide had not been resumed but will resume in the morning -Strict I's and O's and daily weights -Patient is - 2.578 L since admission  Hypothyroidism -Continue with Levothyroxine 50 mcg p.o. daily   BPH -Continue Rapaflo substitution with Flomax 0.4 mg  daily  Hypokalemia -Patient's K+ is now 4.1 -Check Mag Level and was 2.3 -Continue to Monitor and Replete as Necessary -Repeat CMP in the AM   Thrombocytopenia, improved -Patient's Platelet Count went from 130 -> 134 -> 131 -> 144 -> 139 and is now 153 yesterday and is now 147 -Continue to monitor for signs or symptoms of bleeding; currently no overt bleeding noted -Repeat CBC in a.m.  Obesity -Complicates overall prognosis and care -Estimated body mass index is 36.15 kg/m as calculated from the following:   Height as of this encounter: 5\' 6"  (1.676 m).   Weight as of this encounter: 101.6 kg.  -Weight Loss and Dietary Counseling given -Nutritionist consulted and recommending Ensure Enlive p.o. twice daily as well as multivitamin with minerals daily and feeding assistance with meals  DVT prophylaxis: Anticoagulated with apixaban 5 mg p.o. twice daily Code Status: DO NOT RESUSCITATE  Family Communication: Discussed with daughter and wife at bedside Disposition Plan: Pending further clinical improvement and evaluation by PT OT and they have recommended home health but wife had indicated cannot take him home safely so likely will need placement; psych reevaluate his IVC and have rescinded it now  Status is: Inpatient  Remains inpatient appropriate because: Still remains confused and a little agitated  Consultants:  Psychiatry Dr. Weber Cooks Palliative care medicine Neurology  Procedures: None  Antimicrobials:  Anti-infectives (From admission, onward)    Start     Dose/Rate Route Frequency Ordered Stop   05/11/21 0400  vancomycin (VANCOREADY) IVPB 2000 mg/400 mL        2,000 mg 200 mL/hr over 120 Minutes Intravenous  Once 05/11/21 0346 05/11/21 0846   05/11/21 0345  ceFEPIme (MAXIPIME) 2 g in sodium chloride 0.9 % 100 mL IVPB        2 g 200 mL/hr over 30 Minutes Intravenous  Once 05/11/21 0344 05/11/21 0503   05/11/21 0345  metroNIDAZOLE (FLAGYL) IVPB 500 mg        500  mg 100 mL/hr over 60 Minutes Intravenous  Once 05/11/21 0344 05/11/21 0616   05/11/21 0345  vancomycin (VANCOCIN) IVPB 1000 mg/200 mL premix  Status:  Discontinued        1,000 mg 200 mL/hr over 60 Minutes Intravenous  Once 05/11/21 0344 05/11/21 0346        Subjective: Seen and examined and he is more awake and he is alert and he just ambulated with physical therapy.  Denies any chest pain or lightheadedness or dizziness.  Was little short of breath after walking.  No other concerns or complaints at this time.  Objective: Vitals:   05/18/21 0502 05/18/21 0504 05/18/21 0741 05/18/21 1230  BP: (!) 143/74 136/61 120/75 (!) 116/57  Pulse: 70 69 74 84  Resp: 18  18   Temp: 97.6 F (36.4 C)  98.4 F (36.9 C) 98.2 F (36.8 C)  TempSrc: Oral   Oral  SpO2: 97% 97% 98% 99%  Weight:      Height:        Intake/Output Summary (Last 24 hours) at 05/18/2021 1606 Last data filed at 05/18/2021 1300 Gross per 24 hour  Intake 480 ml  Output 600 ml  Net -120 ml    Filed Weights   05/11/21 0225  Weight: 101.6 kg   Examination: Physical Exam:  Constitutional: WN/WD obese Caucasian male in NAD sitting in chair at bedside after ambulating  Eyes: Liids and conjunctivae normal, sclerae anicteric  ENMT: External Ears, Nose appear normal. Grossly normal hearing. Mucous membranes are moist.  Neck: Appears normal, supple, no cervical masses, normal ROM, no appreciable thyromegaly; no appreciable JVD Respiratory: Diminished to auscultation bilaterally with coarse breath sounds, no wheezing, rales, rhonchi or crackles. Normal respiratory effort and patient is not tachypenic. No accessory muscle use.  Unlabored breathing Cardiovascular: RRR, no murmurs / rubs / gallops. S1 and S2 auscultated.  Mild 1+ extremity edema Abdomen: Soft, non-tender, non-distended. Bowel sounds positive.  GU: Deferred. Musculoskeletal: No clubbing / cyanosis of digits/nails. No joint deformity upper and lower extremities.   Skin: No rashes, lesions, ulcers on limited skin evaluation. No induration; Warm and dry.  Neurologic: CN 2-12 grossly intact with no focal deficits. Romberg sign cerebellar reflexes not assessed.  Psychiatric: Normal judgment and insight. Alert and oriented x 3. Normal mood and appropriate affect.   Data Reviewed: I have personally reviewed following labs and imaging studies  CBC: Recent Labs  Lab 05/14/21 0635 05/15/21 0558 05/16/21 0511 05/17/21 0635 05/18/21 0603  WBC 7.0 9.3 7.3 8.2 8.5  NEUTROABS 4.5 6.4 4.8 5.3 5.4  HGB 14.4 15.0 15.0 15.2 14.4  HCT 43.8 45.9 46.2 46.5 45.0  MCV 90.7 93.1 92.8 91.5 94.7  PLT 131* 144* 139* 153 147*    Basic Metabolic Panel: Recent Labs  Lab 05/14/21 0635 05/15/21 0558 05/16/21 0511 05/17/21 0635 05/18/21 0603  NA 136 138 137 137 135  K 3.3* 4.2 3.9 3.9 4.1  CL 104 106 105 105 105  CO2 24 24 24 24 22   GLUCOSE 112* 111* 114* 104* 106*  BUN 11 14 13 16 17   CREATININE 0.77 0.90 0.81 0.86 0.85  CALCIUM 8.5* 9.1 8.9 8.8* 8.6*  MG 2.0 2.1 2.1 2.2 2.3  PHOS 3.1 3.0 3.0 3.5 3.5    GFR: Estimated Creatinine Clearance: 73.5 mL/min (by C-G formula based on SCr of 0.85 mg/dL). Liver Function Tests: Recent Labs  Lab 05/14/21 0635 05/15/21 0558 05/16/21 0511 05/17/21 0635 05/18/21 0603  AST 14* 21 19 19 18   ALT 10 17 15 16 10   ALKPHOS 75 89 78 87 82  BILITOT 1.2 1.0 1.5* 1.0 0.8  PROT 5.9* 6.6 6.0* 6.3* 6.3*  ALBUMIN 3.1* 3.2* 3.4* 3.2* 3.3*    No results for input(s): LIPASE, AMYLASE in the last 168 hours.  No results for input(s): AMMONIA in the last 168 hours. Coagulation Profile: No results for input(s): INR, PROTIME in the last 168 hours. Cardiac Enzymes: No results for input(s): CKTOTAL, CKMB, CKMBINDEX, TROPONINI in the last 168 hours. BNP (last 3 results) No results for input(s): PROBNP in the last 8760 hours. HbA1C: No results for input(s): HGBA1C in the last 72 hours. CBG: Recent Labs  Lab 05/12/21 1619   GLUCAP 133*    Lipid Profile: No results for input(s): CHOL, HDL, LDLCALC, TRIG, CHOLHDL, LDLDIRECT in the last 72 hours. Thyroid Function Tests: No results for input(s): TSH, T4TOTAL, FREET4, T3FREE, THYROIDAB in the last 72 hours. Anemia Panel:  No results for input(s): VITAMINB12, FOLATE, FERRITIN, TIBC, IRON, RETICCTPCT in the last 72 hours. Sepsis Labs: No results for input(s): PROCALCITON, LATICACIDVEN in the last 168 hours.   Recent Results (from the past 240 hour(s))  Resp Panel by RT-PCR (Flu A&B, Covid) Nasopharyngeal Swab     Status: None   Collection Time: 05/11/21  2:31 AM   Specimen: Nasopharyngeal Swab; Nasopharyngeal(NP) swabs in vial transport medium  Result Value Ref Range Status   SARS Coronavirus 2 by RT PCR NEGATIVE NEGATIVE Final    Comment: (NOTE) SARS-CoV-2 target nucleic acids are NOT DETECTED.  The SARS-CoV-2 RNA is generally detectable in upper respiratory specimens during the acute phase of infection. The lowest concentration of SARS-CoV-2 viral copies this assay can detect is 138 copies/mL. A negative result does not preclude SARS-Cov-2 infection and should not be used as the sole basis for treatment or other patient management decisions. A negative result may occur with  improper specimen collection/handling, submission of specimen other than nasopharyngeal swab, presence of viral mutation(s) within the areas targeted by this assay, and inadequate number of viral copies(<138 copies/mL). A negative result must be combined with clinical observations, patient history, and epidemiological information. The expected result is Negative.  Fact Sheet for Patients:  EntrepreneurPulse.com.au  Fact Sheet for Healthcare Providers:  IncredibleEmployment.be  This test is no t yet approved or cleared by the [place] FDA and  has been authorized for detection and/or diagnosis of SARS-CoV-2 by FDA under an Emergency Use  Authorization (EUA). This EUA will remain  in effect (meaning this test can be used) for the duration of the COVID-19 declaration under Section 564(b)(1) of the Act, 21 U.S.C.section 360bbb-3(b)(1), unless the authorization is terminated  or revoked sooner.       Influenza A by PCR NEGATIVE NEGATIVE Final   Influenza B by PCR NEGATIVE NEGATIVE Final    Comment: (NOTE) The Xpert Xpress SARS-CoV-2/FLU/RSV plus assay is intended as an aid in the diagnosis of influenza from Nasopharyngeal swab specimens and should not be used as a sole basis for treatment. Nasal washings and aspirates are unacceptable for Xpert Xpress SARS-CoV-2/FLU/RSV testing.  Fact Sheet for Patients: EntrepreneurPulse.com.au  Fact Sheet for Healthcare Providers: IncredibleEmployment.be  This test is not yet approved or cleared by the [place] FDA and has been authorized for detection and/or diagnosis of SARS-CoV-2 by FDA under an Emergency Use Authorization (EUA). This EUA will remain in effect (meaning this test can be used) for the duration of the COVID-19 declaration under Section 564(b)(1) of the Act, 21 U.S.C. section 360bbb-3(b)(1), unless the authorization is terminated or revoked.  Performed at [facility], [address] [place], [place] [address]   Culture, blood (routine x 2)     Status: None   Collection Time: 05/11/21  2:49 AM   Specimen: BLOOD  Result Value Ref Range Status   Specimen Description BLOOD LEFT HAND  Final   Special Requests IN PEDIATRIC BOTTLE Blood Culture adequate volume  Final   Culture   Final    NO GROWTH 5 DAYS Performed at [facility], [address], [place], [place] [address]    Report Status 05/16/2021 FINAL  Final  Culture, blood (routine x 2)     Status: None   Collection Time: 05/11/21  2:49 AM   Specimen: BLOOD  Result Value Ref Range Status   Specimen Description BLOOD LEFT ARM  Final   Special  Requests IN PEDIATRIC BOTTLE Blood Culture adequate volume  Final   Culture   Final    NO GROWTH 5 DAYS Performed at [facility], [address] [place], [place] [address]    Report Status 05/16/2021 FINAL  Final  Urine Culture     Status: None   Collection Time: 05/11/21  4:45 AM   Specimen: Urine, Clean Catch  Result Value Ref Range Status   Specimen Description   Final    URINE, CLEAN CATCH Performed at [facility], [address] [place], [place] [address]    Special Requests   Final    NONE Performed at [facility], [address] [place], [place] [address]    Culture   Final    NO GROWTH Performed at [facility], [address], [place], [place] [address]    Report Status 05/12/2021 FINAL  Final     RN Pressure Injury Documentation: Pressure Injury 04/12/21 Coccyx Medial Stage 2 -  Partial thickness loss of dermis presenting as a shallow open injury with a red, pink wound bed without slough. cracking mid coccyx (Active)  04/12/21 1930  Location: Coccyx  Location Orientation: Medial  Staging: Stage 2 -  Partial thickness loss of dermis presenting as a shallow open injury with a red, pink wound bed without slough.  Wound Description (Comments): cracking mid coccyx  Present on Admission:     Estimated body mass index is 36.15 kg/m as calculated from the following:   Height as of this encounter: 5\' 6"  (1.676 m).   Weight as of this encounter: 101.6 kg.  Malnutrition Type: Nutrition Problem: Inadequate oral intake Etiology: inability to eat Malnutrition Characteristics: Signs/Symptoms: NPO status Nutrition Interventions: Interventions: Ensure Enlive (each supplement provides 350kcal and 20 grams of protein), MVI  Radiology Studies: No results found.  Scheduled Meds:  apixaban  5 mg Oral BID   carbidopa-levodopa  0.5 tablet Oral QID   carvedilol  6.25 mg Oral BID WC   entacapone  200 mg Oral TID   feeding supplement   237 mL Oral BID BM   levothyroxine  50 mcg Oral Q0600   multivitamin with minerals  1 tablet Oral Daily   QUEtiapine  25 mg Oral QHS   sertraline  50 mg Oral Daily   tamsulosin  0.4 mg Oral Daily   Continuous Infusions:  dextrose 5 % and 0.9 % NaCl with KCl 20 mEq/L 75 mL/hr at 05/18/21 0950    LOS: 7 days   Kerney Elbe, DO [facility] PAGER is on [place]  If 7PM-7AM, please contact night-coverage [email or web address]

## 2021-05-18 NOTE — Telephone Encounter (Signed)
I am sorry to hear that he is hospitalized.  While he is in the hospital, I recommend following recommendations and instructions as provided by the hospital team.  Please relay back to his wife.

## 2021-05-18 NOTE — Progress Notes (Signed)
Occupational Therapy Treatment Patient Details Name: Andre [name] MRN: [id number] DOB: 12-26-1935 Today's Date: 05/18/2021   History of present illness Pt admitted for delirium. HIstory includes Parkinson's dx, CAD, and HTN. Pt currently has sitter at bedside.   OT comments  Chart reviewed, nurse cleared pt for participation in tx session. Co-tx completed with PT. Tx session targeted improving safe completion of independent ADL tasks, progressing functional mobility. Pt with improved cognition on this date. CGA for functional mobility approx 120' with RW in hallway. Pt with minimal progress in ADL completion, continues to perform below PLOF. Intermittent-frequent vcs required throughout for task sequencing, safety with mobility. Pt is left in bedsie chair, NAD, all needs met. OT will continue to follow while admitted.    Recommendations for follow up therapy are one component of a multi-disciplinary discharge planning process, led by the attending physician.  Recommendations may be updated based on patient status, additional functional criteria and insurance authorization.    Follow Up Recommendations  Skilled nursing-short term rehab (<3 hours/day)    Assistance Recommended at Discharge Frequent or constant Supervision/Assistance  Patient can return home with the following  A little help with walking and/or transfers;A little help with bathing/dressing/bathroom;Assistance with cooking/housework;Direct supervision/assist for medications management;Direct supervision/assist for financial management;Assist for transportation   Equipment Recommendations  BSC/3in1;Tub/shower seat;Other (comment) (2ww)    Recommendations for Other Services      Precautions / Restrictions Precautions Precautions: Fall Restrictions Weight Bearing Restrictions: No       Mobility Bed Mobility Overal bed mobility: Needs Assistance Bed Mobility: Supine to Sit     Supine to sit: Min assist, HOB elevated,  Min guard          Transfers Overall transfer level: Needs assistance Equipment used: Rolling walker (2 wheels)                     Balance Overall balance assessment: Needs assistance Sitting-balance support: Feet supported, No upper extremity supported Sitting balance-Leahy Scale: Good     Standing balance support: Bilateral upper extremity supported, During functional activity Standing balance-Leahy Scale: Fair                             ADL either performed or assessed with clinical judgement   ADL                                         General ADL Comments: close sup for seated grooming tasks (washing face, deoderant, washing under arms) in sitting with intermittent vcs required for completion, MIN A for UB dressing, CGA-MIN A STS, CGA +2 for functional mobilith with RW    Extremity/Trunk Assessment              Vision       Perception     Praxis      Cognition Arousal/Alertness: Awake/alert Behavior During Therapy: WFL for tasks assessed/performed Overall Cognitive Status: Impaired/Different from baseline Area of Impairment: Orientation, Attention, Following commands, Problem solving                 Orientation Level: Disoriented to, Time Current Attention Level: Focused   Following Commands: Follows one step commands consistently, Follows multi-step commands inconsistently     Problem Solving: Slow processing, Requires verbal cues, Requires tactile cues General Comments: alert and oriented x3, not oriented  to time; increased        Exercises      Shoulder Instructions       General Comments HR in mid 90's BPM post ambulation with SPO2 on RA 95-96%.    Pertinent Vitals/ Pain       Pain Assessment Faces Pain Scale: Hurts a little bit  Home Living                                          Prior Functioning/Environment              Frequency  Min 2X/week         Progress Toward Goals  OT Goals(current goals can now be found in the care plan section)  Progress towards OT goals: Progressing toward goals     Plan Discharge plan remains appropriate    Co-evaluation    PT/OT/SLP Co-Evaluation/Treatment: Yes Reason for Co-Treatment: Necessary to address cognition/behavior during functional activity;For patient/therapist safety PT goals addressed during session: Mobility/safety with mobility OT goals addressed during session: ADL's and self-care;Proper use of Adaptive equipment and DME      AM-PAC OT "6 Clicks" Daily Activity     Outcome Measure   Help from another person eating meals?: None Help from another person taking care of personal grooming?: None Help from another person toileting, which includes using toliet, bedpan, or urinal?: A Little Help from another person bathing (including washing, rinsing, drying)?: A Lot Help from another person to put on and taking off regular upper body clothing?: A Little Help from another person to put on and taking off regular lower body clothing?: A Little 6 Click Score: 19    End of Session Equipment Utilized During Treatment: Gait belt;Rolling walker (2 wheels)  OT Visit Diagnosis: Unsteadiness on feet (R26.81);Muscle weakness (generalized) (M62.81);Other symptoms and signs involving cognitive function   Activity Tolerance Patient tolerated treatment well   Patient Left in chair;with call bell/phone within reach;with chair alarm set;with family/visitor present   Nurse Communication Mobility status        Time: 2694-8546 OT Time Calculation (min): 27 min  Charges: OT General Charges $OT Visit: 1 Visit OT Treatments $Self Care/Home Management : 8-22 mins  Shanon Payor, OTD OTR/L  05/18/21, 12:30 PM

## 2021-05-18 NOTE — Telephone Encounter (Signed)
LMVM for pts wife that returned call.  

## 2021-05-18 NOTE — Telephone Encounter (Signed)
Pt's wife called in again asking for a call regarding hospital admission. Please advise at [phone].

## 2021-05-18 NOTE — Progress Notes (Signed)
Physical Therapy Treatment Patient Details Name: Andre [name] MRN: [id number] DOB: 1935-05-24 Today's Date: 05/18/2021   History of Present Illness Pt admitted for delirium. HIstory includes Parkinson's dx, CAD, and HTN. Pt currently has sitter at bedside.    PT Comments    Pt received for PT/OT co-treat for pt and therapist safety. With family present in room. Pt agreeable to PT services. Pt A&Ox3 except to time. With increased time and minA, pt able to transfer to EOB with assist at chuck pad and minguard with cues for safe hand placement to stand to RW. Minguard provided with pt ambulating ~120' with shuffling gait pattern with variable increases in speed and increase in shuffling similar to festinating gait. Intermittent VC's provided for foot clearance and termination of gait when shuffling increases due to risk of anterior LOB. Fair carryover after cuing and education on gait with slight improvement in B foot clearance during swing phase. Pt returned to room in recliner with poor sequencing and unsafe transfer despite mod to max multimodal cues and education. PT then participating in ADL and grooming tasks with ADL's. All needs in reach. D/c recs remain appropriate at this time. Will continue to follow acutely to improve gait and safe use of DME with functional mobility to reduce risk of falls.    Recommendations for follow up therapy are one component of a multi-disciplinary discharge planning process, led by the attending physician.  Recommendations may be updated based on patient status, additional functional criteria and insurance authorization.  Follow Up Recommendations  Home health PT     Assistance Recommended at Discharge Frequent or constant Supervision/Assistance  Patient can return home with the following A little help with walking and/or transfers;A little help with bathing/dressing/bathroom;Help with stairs or ramp for entrance;Assistance with cooking/housework;Assist for  transportation   Equipment Recommendations  Other (comment) (TBD)    Recommendations for Other Services       Precautions / Restrictions Precautions Precautions: Fall Restrictions Weight Bearing Restrictions: No     Mobility  Bed Mobility Overal bed mobility: Needs Assistance Bed Mobility: Supine to Sit     Supine to sit: Min assist, HOB elevated     General bed mobility comments: increased time, cuing for UE use and minA at chuck pad to reach feet onto floor at EOB. Patient Response: Cooperative, Flat affect  Transfers Overall transfer level: Needs assistance Equipment used: Rolling walker (2 wheels) Transfers: Sit to/from Stand Sit to Stand: Min guard           General transfer comment: increased time, cues for use of walker    Ambulation/Gait Ambulation/Gait assistance: Min guard Gait Distance (Feet): 120 Feet Assistive device: Rolling walker (2 wheels) Gait Pattern/deviations: Shuffle, Decreased dorsiflexion - right, Decreased dorsiflexion - left       General Gait Details: shuffling gait appreciated with RW use. Able to improve foot clearance during swing phase minimally with VC's.   Stairs             Wheelchair Mobility    Modified Rankin (Stroke Patients Only)       Balance Overall balance assessment: Needs assistance Sitting-balance support: Feet supported, No upper extremity supported Sitting balance-Leahy Scale: Good     Standing balance support: Bilateral upper extremity supported, During functional activity Standing balance-Leahy Scale: Fair Standing balance comment: UE's on RW                            Cognition  Arousal/Alertness: Awake/alert Behavior During Therapy: WFL for tasks assessed/performed Overall Cognitive Status: History of cognitive impairments - at baseline                                 General Comments: Pt A&Ox3, except to time (thought year was 2024 and was february). Wife  states cognition has improved.        Exercises Other Exercises Other Exercises: Gait mechanics and safe use of RW.    General Comments General comments (skin integrity, edema, etc.): HR in mid 90's BPM post ambulation with SPO2 on RA 95-96%.      Pertinent Vitals/Pain Pain Assessment Pain Assessment: Faces Faces Pain Scale: Hurts a little bit Pain Location: L first MTP joint Pain Intervention(s): Repositioned, Monitored during session    Home Living                          Prior Function            PT Goals (current goals can now be found in the care plan section) Acute Rehab PT Goals Patient Stated Goal: unable to state PT Goal Formulation: Patient unable to participate in goal setting    Frequency    Min 2X/week      PT Plan Current plan remains appropriate    Co-evaluation PT/OT/SLP Co-Evaluation/Treatment: Yes Reason for Co-Treatment: Necessary to address cognition/behavior during functional activity;For patient/therapist safety PT goals addressed during session: Mobility/safety with mobility;Proper use of DME OT goals addressed during session: ADL's and self-care      AM-PAC PT "6 Clicks" Mobility   Outcome Measure  Help needed turning from your back to your side while in a flat bed without using bedrails?: A Little Help needed moving from lying on your back to sitting on the side of a flat bed without using bedrails?: A Little Help needed moving to and from a bed to a chair (including a wheelchair)?: A Little Help needed standing up from a chair using your arms (e.g., wheelchair or bedside chair)?: A Little Help needed to walk in hospital room?: A Little Help needed climbing 3-5 steps with a railing? : A Lot 6 Click Score: 17    End of Session Equipment Utilized During Treatment: Gait belt   Patient left: in chair;with call bell/phone within reach;with chair alarm set;with family/visitor present Nurse Communication: Mobility status PT  Visit Diagnosis: Unsteadiness on feet (R26.81);Muscle weakness (generalized) (M62.81);Difficulty in walking, not elsewhere classified (R26.2)     Time: 8315-1761 PT Time Calculation (min) (ACUTE ONLY): 25 min  Charges:  $Gait Training: 8-22 mins                      Salem Caster. Fairly IV, PT, DPT Physical Therapist- [facility]  05/18/2021, 12:08 PM

## 2021-05-19 DIAGNOSIS — F22 Delusional disorders: Secondary | ICD-10-CM

## 2021-05-19 DIAGNOSIS — G20 Parkinson's disease: Secondary | ICD-10-CM

## 2021-05-19 DIAGNOSIS — G309 Alzheimer's disease, unspecified: Secondary | ICD-10-CM

## 2021-05-19 DIAGNOSIS — F0280 Dementia in other diseases classified elsewhere without behavioral disturbance: Secondary | ICD-10-CM

## 2021-05-19 DIAGNOSIS — Z9989 Dependence on other enabling machines and devices: Secondary | ICD-10-CM

## 2021-05-19 DIAGNOSIS — R443 Hallucinations, unspecified: Secondary | ICD-10-CM

## 2021-05-19 LAB — COMPREHENSIVE METABOLIC PANEL
ALT: 7 U/L (ref 0–44)
AST: 21 U/L (ref 15–41)
Albumin: 3 g/dL — ABNORMAL LOW (ref 3.5–5.0)
Alkaline Phosphatase: 74 U/L (ref 38–126)
Anion gap: 6 (ref 5–15)
BUN: 14 mg/dL (ref 8–23)
CO2: 24 mmol/L (ref 22–32)
Calcium: 8.5 mg/dL — ABNORMAL LOW (ref 8.9–10.3)
Chloride: 105 mmol/L (ref 98–111)
Creatinine, Ser: 0.83 mg/dL (ref 0.61–1.24)
GFR, Estimated: >60 mL/min (ref >60)
Glucose, Bld: 112 mg/dL — ABNORMAL HIGH (ref 70–99)
Potassium: 4.2 mmol/L (ref 3.5–5.1)
Sodium: 135 mmol/L (ref 135–145)
Total Bilirubin: 0.7 mg/dL (ref 0.3–1.2)
Total Protein: 5.6 g/dL — ABNORMAL LOW (ref 6.5–8.1)

## 2021-05-19 LAB — CBC WITH DIFFERENTIAL/PLATELET
Abs Immature Granulocytes: 0.03 10*3/uL (ref 0.00–0.07)
Basophils Absolute: 0.1 10*3/uL (ref 0.0–0.1)
Basophils Relative: 1 %
Eosinophils Absolute: 0.2 10*3/uL (ref 0.0–0.5)
Eosinophils Relative: 2 %
HCT: 42.9 % (ref 39.0–52.0)
Hemoglobin: 13.7 g/dL (ref 13.0–17.0)
Immature Granulocytes: 0 %
Lymphocytes Relative: 27 %
Lymphs Abs: 2.2 10*3/uL (ref 0.7–4.0)
MCH: 29.9 pg (ref 26.0–34.0)
MCHC: 31.9 g/dL (ref 30.0–36.0)
MCV: 93.7 fL (ref 80.0–100.0)
Monocytes Absolute: 0.7 10*3/uL (ref 0.1–1.0)
Monocytes Relative: 9 %
Neutro Abs: 5.1 10*3/uL (ref 1.7–7.7)
Neutrophils Relative %: 61 %
Platelets: 134 10*3/uL — ABNORMAL LOW (ref 150–400)
RBC: 4.58 MIL/uL (ref 4.22–5.81)
RDW: 14.2 % (ref 11.5–15.5)
WBC: 8.3 10*3/uL (ref 4.0–10.5)
nRBC: 0 % (ref 0.0–0.2)

## 2021-05-19 LAB — MAGNESIUM: Magnesium: 2.2 mg/dL (ref 1.7–2.4)

## 2021-05-19 LAB — PHOSPHORUS: Phosphorus: 3.7 mg/dL (ref 2.5–4.6)

## 2021-05-19 MED ORDER — COLCHICINE 0.6 MG PO TABS
0.6000 mg | ORAL_TABLET | Freq: Every day | ORAL | Status: DC
  Administered 2021-05-19 – 2021-05-23 (×5): 0.6 mg via ORAL
  Filled 2021-05-19 (×7): qty 1

## 2021-05-19 NOTE — Assessment & Plan Note (Addendum)
Continue CPAP 12 cm with full facemask if allows

## 2021-05-19 NOTE — Assessment & Plan Note (Addendum)
Dr. Quinn Axe signed off but is available if needed Seroquel 50 nightly-no Haldol Continue sertraline 50 daily, Geodon only as needed IM 20 mg TOC looking for options for safety for patient/wife Has safety observation and high risk of wandering and anxiety  reassess daily

## 2021-05-19 NOTE — TOC Progression Note (Addendum)
Transition of Care [facility]) - Progression Note    Patient Details  Name: [name] MRN: [id number] Date of Birth: 08-31-1935  Transition of Care [facility]) CM/SW [place], RN Phone Number: 05/19/2021, 9:57 AM  Clinical Narrative:   Spouse toured [name], states this is not an option financially.  Therapy recommends Home health, however patient has health team advantage and home health is challenging to obtain.  Provided spouse with resources for [facility], [place] and [facility], spouse will review.  She states she does not believe they will qualify for Medicaid to pay for [place].  Addendum 1021am:  Spouse would like [facility] to [facility] patient [name] at [facility] notified, care team notified.         Expected Discharge Plan and Services                                                 Social Determinants of Health (SDOH) Interventions    Readmission Risk Interventions No flowsheet data found.

## 2021-05-19 NOTE — Assessment & Plan Note (Addendum)
Sepsis ruled ou, PCT less than 0.10 No further work-up needed at this time

## 2021-05-19 NOTE — Progress Notes (Signed)
Patient pleasantly confused laying sideways in bed. RN reports attempting to get out of bed on numerous occasions. Cpap not placed at this time. Not safe for patient has he is pulling other things off.

## 2021-05-19 NOTE — Assessment & Plan Note (Addendum)
CHADS2 score >4, continue Eliquis 5 twice daily Coreg 6.25 twice daily Has bled score is elevated however patient has not had any falls recently Outpatient cardiology discussion is required regarding continuation of DOAC
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
## 2021-05-19 NOTE — Assessment & Plan Note (Signed)
Continue Flomax 0.4

## 2021-05-19 NOTE — Telephone Encounter (Signed)
I called and LMVM for wife Dr. Rexene Alberts 's message and when in hospital to follow recommendations and instructions as provided by hospital team.  [facility] he was doing better.  She is to callback as needed.

## 2021-05-19 NOTE — Hospital Course (Addendum)
86 year old community dwelling white male, lives with his wife CAD CABG X1 HFpEF permanent A. fib CHADS2 score >4/Eliquis Parkinson's disease plus dementia IPF Recent hospitalization 12/25--12/27 HFpEF exacerbation--OV cardiology 04/27/2021 patient appeared to be in normal mental state  On admission tachypneic 22 O2 sat 94% lactic acid 2.8 troponin 9 lipase 24 EtOH 10 COVID influenza negative CXR cardiogenic failure cardiomegaly Developed significant agitation belligerent see and Haldol given to the patient in ED Psychiatry consulted, palliative care consulted  Message so strep right 86 year old strep pneumonia watery

## 2021-05-19 NOTE — Progress Notes (Addendum)
°  Progress Note   Patient: Andre [name] EZM:[id number] DOB: 11-28-1935 DOA: 05/11/2021     8 DOS: the patient was seen and examined on 05/19/2021   Brief hospital course: 86 year old community dwelling white male, lives with his wife CAD CABG X1 HFpEF permanent A. fib CHADS2 score >4/Eliquis Parkinson's disease plus dementia IPF Recent hospitalization 12/25--12/27 HFpEF exacerbation--OV cardiology 04/27/2021 patient appeared to be in normal mental state  On admission tachypneic 22 O2 sat 94% lactic acid 2.8 troponin 9 lipase 24 EtOH 10 COVID influenza negative CXR cardiogenic failure cardiomegaly Developed significant agitation belligerent see and Haldol given to the patient in ED Psychiatry consulted, palliative care consulted    Assessment and Plan: * Acute hyperactive delirium due to advanced parkinsonism with Parkinson's dementia Appreciate neurology Dr. Quinn Axe input Seroquel being placed at 50 at bedtime discontinue Haldol Continue sertraline 50 daily, Geodon only as needed IM 20 mg TOC looking for options for safety for patient/wife Has safety observation and high risk of wandering and anxiety-hopefully with medication changes will colm further  Lactic acidosis Sepsis ruled ou, PCT less than 0.10  PAF (paroxysmal atrial fibrillation) ([place])- (present on admission) CHADS2 score >4, continue Eliquis 5 twice daily Coreg 6.25 twice daily Need outpatient discussion regarding anticoagulation as he progresses with his Parkinson's-May need to discontinue the same  OSA on CPAP Continue CPAP 12 cm with full facemask if allows RT to see Will need outpatient further management  BPH (benign prostatic hyperplasia) Continue Flomax 0.4      Subjective: He tells me he is having some pain in his toe from gout He cannot remember the last time he took colchicine He has no chest pain He is ambulatory and quite distractible His wife is in the room coaxing him to get back into  bed  Physical Exam: Vitals:   05/19/21 0022 05/19/21 0433 05/19/21 0900 05/19/21 1118  BP: 121/77 (!) 136/120 124/72 122/72  Pulse: 80 71 82 71  Resp: 16 18 16 15   Temp: 98.8 F (37.1 C)  97.8 F (36.6 C) 98 F (36.7 C)  TempSrc: Oral  Oral Oral  SpO2: 100% 100% 98% (!) 70%  Weight:      Height:       EOMI NCAT no focal deficit power 5/5 facies flat-I do not appreciate a tremor Chest is clear no added sound abdomen soft no rebound S1-S2 no murmur  Data Reviewed: BUN/creatinine 14/0.8, white count 8.3, albumin 3, CBG ranges 100-1 20   Family Communication: Discussed with wife at the bedside  Disposition: Status is: Inpatient Remains inpatient appropriate because: Needs placement  Planned Discharge Destination: Barriers to discharge: Unclear at this time-TOC working on proper disposition plan as patient is not easily redirectable  Needs a safer plan for discharge-Long conversation with Dr. Quinn Axe in addition who feels disposition planning and placement is preferable         Time spent: 37 minutes  Author: Nita Sells, MD 05/19/2021 2:51 PM  For on call review [email or web address].

## 2021-05-20 ENCOUNTER — Ambulatory Visit: Payer: HMO | Admitting: Cardiovascular Disease

## 2021-05-20 DIAGNOSIS — M109 Gout, unspecified: Secondary | ICD-10-CM

## 2021-05-20 DIAGNOSIS — E876 Hypokalemia: Secondary | ICD-10-CM

## 2021-05-20 LAB — COMPREHENSIVE METABOLIC PANEL
ALT: 10 U/L (ref 0–44)
AST: 26 U/L (ref 15–41)
Albumin: 3.6 g/dL (ref 3.5–5.0)
Alkaline Phosphatase: 107 U/L (ref 38–126)
Anion gap: 8 (ref 5–15)
BUN: 11 mg/dL (ref 8–23)
CO2: 27 mmol/L (ref 22–32)
Calcium: 9 mg/dL (ref 8.9–10.3)
Chloride: 103 mmol/L (ref 98–111)
Creatinine, Ser: 0.8 mg/dL (ref 0.61–1.24)
GFR, Estimated: >60 mL/min (ref >60)
Glucose, Bld: 98 mg/dL (ref 70–99)
Potassium: 4.4 mmol/L (ref 3.5–5.1)
Sodium: 138 mmol/L (ref 135–145)
Total Bilirubin: 1.2 mg/dL (ref 0.3–1.2)
Total Protein: 6.7 g/dL (ref 6.5–8.1)

## 2021-05-20 LAB — CBC WITH DIFFERENTIAL/PLATELET
Abs Immature Granulocytes: 0.03 10*3/uL (ref 0.00–0.07)
Basophils Absolute: 0.1 10*3/uL (ref 0.0–0.1)
Basophils Relative: 1 %
Eosinophils Absolute: 0.1 10*3/uL (ref 0.0–0.5)
Eosinophils Relative: 2 %
HCT: 49.1 % (ref 39.0–52.0)
Hemoglobin: 15.8 g/dL (ref 13.0–17.0)
Immature Granulocytes: 0 %
Lymphocytes Relative: 25 %
Lymphs Abs: 1.8 10*3/uL (ref 0.7–4.0)
MCH: 29.8 pg (ref 26.0–34.0)
MCHC: 32.2 g/dL (ref 30.0–36.0)
MCV: 92.6 fL (ref 80.0–100.0)
Monocytes Absolute: 0.7 10*3/uL (ref 0.1–1.0)
Monocytes Relative: 9 %
Neutro Abs: 4.7 10*3/uL (ref 1.7–7.7)
Neutrophils Relative %: 63 %
Platelets: 160 10*3/uL (ref 150–400)
RBC: 5.3 MIL/uL (ref 4.22–5.81)
RDW: 14.2 % (ref 11.5–15.5)
WBC: 7.5 10*3/uL (ref 4.0–10.5)
nRBC: 0 % (ref 0.0–0.2)

## 2021-05-20 MED ORDER — QUETIAPINE FUMARATE 25 MG PO TABS
50.0000 mg | ORAL_TABLET | Freq: Every day | ORAL | Status: DC
  Administered 2021-05-20 – 2021-05-23 (×4): 50 mg via ORAL
  Filled 2021-05-20 (×5): qty 2

## 2021-05-20 NOTE — Assessment & Plan Note (Signed)
Patient on [facility] as an outpatient Here I have started colchicine based on him having pain on 05/19/2021 Monitor for diarrhea Follow-up clinically as an outpatient

## 2021-05-20 NOTE — Progress Notes (Signed)
Occupational Therapy Treatment Patient Details Name: Andre [name] MRN: [id number] DOB: 1935-07-08 Today's Date: 05/20/2021   History of present illness Pt admitted for delirium. HIstory includes Parkinson's dx, CAD, and HTN. Pt currently has sitter at bedside.   OT comments  Andre [name] was seen for OT treatment on this date. Upon arrival to room pt reclined in bed, wife at bedside. Pt oriented to self only, presents with garbled speech mentioning helicopters and frequently reaching for objects that are not there. MAX cues t/o for safety and redirection. Pt tolerates x10 sit<.stand trials at EOB, rises easily with no physical assist both with and without single UE support. In standing pt reaching for objects in the air requiring CGA for safety, intermittent MIN A for minor LOBs. Pt removes teeth and requires cues to locate in bed.   Spouse instructed in falls prevention and importance of maintaining proper sleep/wake cycle. Pt making good progress toward goals. Pt continues to benefit from skilled OT services to maximize return to PLOF and minimize risk of future falls, injury, caregiver burden, and readmission. Will continue to follow POC. Discharge recommendation updated to reflect need for OT in next setting however cognition continues to be limiting pt.     Recommendations for follow up therapy are one component of a multi-disciplinary discharge planning process, led by the attending physician.  Recommendations may be updated based on patient status, additional functional criteria and insurance authorization.    Follow Up Recommendations  Home health OT    Assistance Recommended at Discharge Frequent or constant Supervision/Assistance  Patient can return home with the following  A little help with walking and/or transfers;A little help with bathing/dressing/bathroom;Assistance with cooking/housework;Direct supervision/assist for medications management;Direct supervision/assist for financial  management;Assist for transportation   Equipment Recommendations  BSC/3in1;Tub/shower seat;Other (comment) (2WW)    Recommendations for Other Services      Precautions / Restrictions Precautions Precautions: Fall Restrictions Weight Bearing Restrictions: No       Mobility Bed Mobility Overal bed mobility: Needs Assistance Bed Mobility: Supine to Sit, Sit to Supine     Supine to sit: Supervision Sit to supine: Min assist   General bed mobility comments: assist to initiate return to bed    Transfers Overall transfer level: Needs assistance Equipment used: None Transfers: Sit to/from Stand Sit to Stand: Min guard           General transfer comment: rises easily with no physical assist with and without single UE support on rail.     Balance Overall balance assessment: Needs assistance Sitting-balance support: Feet supported, No upper extremity supported Sitting balance-Leahy Scale: Good     Standing balance support: No upper extremity supported, During functional activity Standing balance-Leahy Scale: Fair Standing balance comment: intermittnent minor LOBs corrects with MIN A                           ADL either performed or assessed with clinical judgement   ADL Overall ADL's : Needs assistance/impaired                                       General ADL Comments: CGA for ADL t/f. Pt removes teeth and requires cues to locate them throughout session      Cognition Arousal/Alertness: Awake/alert Behavior During Therapy: [facility] for tasks assessed/performed Overall Cognitive Status: Impaired/Different from baseline Area of Impairment: Orientation,  Attention, Following commands, Problem solving                 Orientation Level: Disoriented to, Place, Time, Situation     Following Commands: Follows one step commands consistently, Follows multi-step commands inconsistently     Problem Solving: Slow processing, Requires verbal  cues, Requires tactile cues General Comments: garbled speech, repeatedly commenting on helicopters and airplanes, reaches out for objects that are not there.              General Comments SpO2 99% on RA    Pertinent Vitals/ Pain       Pain Assessment Pain Assessment: No/denies pain   Frequency  Min 2X/week        Progress Toward Goals  OT Goals(current goals can now be found in the care plan section)  Progress towards OT goals: Progressing toward goals  Acute Rehab OT Goals Patient Stated Goal: to get better OT Goal Formulation: With family Time For Goal Achievement: 05/27/21 Potential to Achieve Goals: Good ADL Goals Pt Will Perform Lower Body Dressing: with supervision Pt Will Transfer to Toilet: with supervision Pt Will Perform Toileting - Clothing Manipulation and hygiene: with supervision  Plan Frequency remains appropriate;Discharge plan needs to be updated    Co-evaluation                 AM-PAC OT "6 Clicks" Daily Activity     Outcome Measure   Help from another person eating meals?: None Help from another person taking care of personal grooming?: None Help from another person toileting, which includes using toliet, bedpan, or urinal?: A Little Help from another person bathing (including washing, rinsing, drying)?: A Lot Help from another person to put on and taking off regular upper body clothing?: A Little Help from another person to put on and taking off regular lower body clothing?: A Little 6 Click Score: 19    End of Session Equipment Utilized During Treatment: Gait belt  OT Visit Diagnosis: Unsteadiness on feet (R26.81);Muscle weakness (generalized) (M62.81);Other symptoms and signs involving cognitive function   Activity Tolerance Patient tolerated treatment well   Patient Left in bed;with call bell/phone within reach;with bed alarm set;with family/visitor present   Nurse Communication          Time: 7026-3785 OT Time  Calculation (min): 23 min  Charges: OT General Charges $OT Visit: 1 Visit OT Treatments $Self Care/Home Management : 8-22 mins $Therapeutic Activity: 8-22 mins  Dessie Coma, M.S. OTR/L  05/20/21, 3:14 PM  ascom [phone]

## 2021-05-20 NOTE — TOC Progression Note (Addendum)
Transition of Care [facility]) - Progression Note    Patient Details  Name: Andre [name] MRN: [id number] Date of Birth: 1935-09-09  Transition of Care [facility]) CM/SW [place], RN Phone Number: 05/20/2021, 3:35 PM  Clinical Narrative:    Patient will remain in hospital until behaviors improve and treatment team feel discharge home is safe plan as patient has no other discharge options at this time. Wife confirmed patient unable to afford private pay and does not qualify for Medicaid.Patient is not rehab candidate. Will monitor for medication changes and improved behaviors to allow for safe return home with wife. TOC will follow for MD recommendations for safe dc plan.   [facility] under Dementia program one medically cleared to return home.         Expected Discharge Plan and Services                                                 Social Determinants of Health (SDOH) Interventions    Readmission Risk Interventions No flowsheet data found.

## 2021-05-20 NOTE — Assessment & Plan Note (Signed)
Not on any diuretic at this time-monitor trends of weights periodically

## 2021-05-20 NOTE — Assessment & Plan Note (Signed)
Patient was getting D5 with K which has been discontinued because of his underlying history of heart failure Potassium is stable at 4.4 Periodic labs

## 2021-05-20 NOTE — Plan of Care (Signed)
Please see full consult note from yesterday for recommendations. Neurology will be available as needed for questions going forward.  Su Monks, MD [facility] [phone]  If 7pm- 7am, please page neurology on call as listed in [place].
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
## 2021-05-20 NOTE — Progress Notes (Signed)
Progress Note   Patient: Andre [name] YFV:[id number] DOB: 1935/04/22 DOA: 05/11/2021     9 DOS: the patient was seen and examined on 05/20/2021   Brief hospital course: 86 year old community dwelling white male, lives with his wife CAD CABG X1 HFpEF permanent A. fib CHADS2 score >4/Eliquis Parkinson's disease plus dementia IPF Recent hospitalization 12/25--12/27 HFpEF exacerbation--OV cardiology 04/27/2021 patient appeared to be in normal mental state  On admission tachypneic 22 O2 sat 94% lactic acid 2.8 troponin 9 lipase 24 EtOH 10 COVID influenza negative CXR cardiogenic failure cardiomegaly Developed significant agitation belligerent see and Haldol given to the patient in ED Psychiatry consulted, palliative care consulted    Assessment and Plan: * Acute hyperactive delirium due to advanced parkinsonism with Parkinson's dementia Dr. Quinn Axe signed off but is available if needed Seroquel 50 nightly-no Haldol Continue sertraline 50 daily, Geodon only as needed IM 20 mg TOC looking for options for safety for patient/wife Has safety observation and high risk of wandering and anxiety  reassess daily  Lactic acidosis Sepsis ruled ou, PCT less than 0.10 No further work-up needed at this time  PAF (paroxysmal atrial fibrillation) ([place])- (present on admission) CHADS2 score >4, continue Eliquis 5 twice daily Coreg 6.25 twice daily Has bled score is elevated however patient has not had any falls recently Outpatient cardiology discussion is required regarding continuation of DOAC  Hypokalemia Patient was getting D5 with K which has been discontinued because of his underlying history of heart failure Potassium is stable at 4.4 Periodic labs  OSA on CPAP Continue CPAP 12 cm with full facemask if allows   (HFpEF) heart failure with preserved ejection fraction ([place])- (present on admission) Not on any diuretic at this time-monitor trends of weights periodically  Gout attack Patient on  Uloric as an outpatient Here I have started colchicine based on him having pain on 05/19/2021 Monitor for diarrhea Follow-up clinically as an outpatient  BPH (benign prostatic hyperplasia) Continue Flomax 0.4      Subjective:  He is working with occupational therapy when I see him He has no specific complaint today although history is limited by his Parkinson's plus dementia His wife tells me he did not sleep well last night and was "up the whole night"   Physical Exam: Vitals:   05/20/21 0446 05/20/21 0720 05/20/21 1149 05/20/21 1529  BP: (!) 147/89 135/81 133/62 (!) 125/56  Pulse: 68 73 70 (!) 54  Resp: 18 20 20 20   Temp: 97.7 F (36.5 C) 98 F (36.7 C) 98.4 F (36.9 C) 99.1 F (37.3 C)  TempSrc: Oral     SpO2: 98% 93% 99% 94%  Weight:      Height:       EOMI NCAT no focal deficit power 5/5 facies flat- No tremor S1-S2 no murmur no rub no gallop CTA B no added sound no rales no rhonchi Abdomen soft nontender no rebound No lower extremity edema Neurologically intact, Mast/flat affect  Data Reviewed: BUN/creatinine 11/0.8 potassium 4.4, white count normal hemoglobin slightly elevated 15 range?   Family Communication: Discussed with wife at the bedside  Disposition: Status is: Inpatient Remains inpatient appropriate because: Needs placement  Planned Discharge Destination: Barriers to discharge: Unclear at this time-TOC working on proper disposition plan as patient is not easily redirectable  Needs a safer plan for discharge-Long conversation with Dr. Quinn Axe in addition who feels disposition planning and placement is preferable        Time spent: 37 minutes  Author: Nita Sells,  MD 05/20/2021 5:48 PM  For on call review [email or web address].

## 2021-05-20 NOTE — TOC Progression Note (Signed)
Transition of Care [facility]) - Progression Note    Patient Details  Name: Andre [name] MRN: [id number] Date of Birth: [birth year]  Transition of [facility]) CM/SW [place], RN Phone Number: 05/20/2021, 3:45 PM  Clinical Narrative:   Community hospice was in to evaluate patient.  However, Hospitalist and Neurologist state that patient is not safe to return home.  Wife unable to pay for memory care and SNF not an option.  TOC supervisor and Administration looking into patient's disposition.  TOC to follow.         Expected Discharge Plan and Services                                                 Social Determinants of Health (SDOH) Interventions    Readmission Risk Interventions No flowsheet data found.

## 2021-05-20 NOTE — Progress Notes (Signed)
PT Cancellation Note  Patient Details Name: CHEICK SUHR MRN: [id number] DOB: 29-Oct-1935   Cancelled Treatment:    Reason Eval/Treat Not Completed: Patient's level of consciousness. Spoke with nurse. Patient still groggy from medications given overnight per report. PT will hold and follow up as patient able to participate.   Minna Merritts, PT, MPT  Percell Locus 05/20/2021, 10:10 AM

## 2021-05-21 NOTE — Progress Notes (Signed)
Occupational Therapy Treatment Patient Details Name: Andre [name] MRN: [id number] DOB: [birth year] Today's Date: 05/21/2021   History of present illness Pt admitted for delirium. HIstory includes Parkinson's dx, CAD, and HTN. Pt currently has sitter at bedside.   OT comments  Pt seen for co-tx with PT to address ADL/mobility. Pt initially sleeping, requiring verbal cues and tactile cues to improve alertness for session. Pt required MOD A +2 for bed mobility given decreased alertness with noted improvement once seated EOB in both alertness and interaction with therapists. Pt at times difficult to understand due to mumbling speech. Oriented to himself and states "Don't tell me I'm back in the hospital." Pt able to follow simple commands with intermittent VC/TC for sequencing or to initiate and pt able to complete after. Pt stood with PT + RW +2 for safety. In standing evidence of BM noted on linens. Pt agreeable to pericare, requiring MAX A in standing with BUE Support on RW + PT for CGA. After exercises with PT EOB, pt instructed in shoulder rolls with VC/TC for technique and to initiate/complete x10. Pt left in chair position, lights on, and tv on to promote more appropriate wake/sleep cycles. Nurse tech notified and verbalize plan to see pt and encourage self feeding/drinking. Pt continues to benefit from skilled OT services. Continue to recommend HHOT to support pt in return to home routines and support caregiver training/education and home/routines modifications as needed.    Recommendations for follow up therapy are one component of a multi-disciplinary discharge planning process, led by the attending physician.  Recommendations may be updated based on patient status, additional functional criteria and insurance authorization.    Follow Up Recommendations  Home health OT    Assistance Recommended at Discharge Frequent or constant Supervision/Assistance  Patient can return home with the following   A little help with walking and/or transfers;A little help with bathing/dressing/bathroom;Assistance with cooking/housework;Direct supervision/assist for medications management;Direct supervision/assist for financial management;Assist for transportation;Help with stairs or ramp for entrance   Equipment Recommendations  BSC/3in1;Tub/shower seat;Other (comment) (2WW)    Recommendations for Other Services      Precautions / Restrictions Precautions Precautions: Fall Restrictions Weight Bearing Restrictions: No       Mobility Bed Mobility Overal bed mobility: Needs Assistance Bed Mobility: Supine to Sit, Sit to Supine     Supine to sit: Mod assist, +2 for physical assistance Sit to supine: Min assist   General bed mobility comments: VC for sequencing    Transfers Overall transfer level: Needs assistance Equipment used: Rolling walker (2 wheels), None Transfers: Sit to/from Stand Sit to Stand: Min assist, Supervision, Min guard           General transfer comment: initially MIN A x1 with RW improving to Sup +1 without AD, +2 for safety     Balance Overall balance assessment: Needs assistance Sitting-balance support: Feet supported, No upper extremity supported Sitting balance-Leahy Scale: Good     Standing balance support: No upper extremity supported, During functional activity Standing balance-Leahy Scale: Fair                             ADL either performed or assessed with clinical judgement   ADL Overall ADL's : Needs assistance/impaired  General ADL Comments: Pt required set up and supv for seated grooming tasks, MAX A for pericare in standing with CGA from PT for standing balance. BUE support on RW.    Extremity/Trunk Assessment              Vision       Perception     Praxis      Cognition Arousal/Alertness: Awake/alert Behavior During Therapy: WFL for tasks  assessed/performed Overall Cognitive Status: No family/caregiver present to determine baseline cognitive functioning                                 General Comments: Initially groggy, but with lights on and verbal/tactile cues and mobility, improves, intermittent VC for safety, oriented to self and "hospital"        Exercises Other Exercises Other Exercises: Pt instructed in seated UB/shoulder exercises requiring tactile and verbal cues to initiate, count, and perform correct technique for shoulder rolls Other Exercises: OT provied tactile/visual cue while PT instucted pt in seated LB exercises with noted improvement in technique Other Exercises: lights left on, tv on, and pt in chair position in the bed to support improved sleep/wake cycles    Shoulder Instructions       General Comments      Pertinent Vitals/ Pain       Pain Assessment Pain Assessment: No/denies pain  Home Living                                          Prior Functioning/Environment              Frequency  Min 2X/week        Progress Toward Goals  OT Goals(current goals can now be found in the care plan section)  Progress towards OT goals: Progressing toward goals  Acute Rehab OT Goals Patient Stated Goal: get better OT Goal Formulation: With family Time For Goal Achievement: 05/27/21 Potential to Achieve Goals: Good  Plan Frequency remains appropriate;Discharge plan remains appropriate    Co-evaluation    PT/OT/SLP Co-Evaluation/Treatment: Yes Reason for Co-Treatment: Necessary to address cognition/behavior during functional activity;To address functional/ADL transfers;For patient/therapist safety PT goals addressed during session: Mobility/safety with mobility;Balance;Proper use of DME OT goals addressed during session: ADL's and self-care      AM-PAC OT "6 Clicks" Daily Activity     Outcome Measure   Help from another person eating meals?:  None Help from another person taking care of personal grooming?: None Help from another person toileting, which includes using toliet, bedpan, or urinal?: A Little Help from another person bathing (including washing, rinsing, drying)?: A Lot Help from another person to put on and taking off regular upper body clothing?: A Little Help from another person to put on and taking off regular lower body clothing?: A Little 6 Click Score: 19    End of Session Equipment Utilized During Treatment: Gait belt;Rolling walker (2 wheels)  OT Visit Diagnosis: Unsteadiness on feet (R26.81);Muscle weakness (generalized) (M62.81);Other symptoms and signs involving cognitive function   Activity Tolerance Patient tolerated treatment well   Patient Left in bed;with call bell/phone within reach;with bed alarm set   Nurse Communication          Time: 5284-1324 OT Time Calculation (min): 29 min  Charges: OT General Charges $OT Visit: 1 Visit  OT Treatments $Self Care/Home Management : 8-22 mins  Ardeth Perfect., MPH, MS, OTR/L ascom [phone] 05/21/21, 1:23 PM

## 2021-05-21 NOTE — Progress Notes (Signed)
°  Progress Note   Patient: Andre [name] RCV:[id number] DOB: [birth year] DOA: 05/11/2021     10 DOS: the patient was seen and examined on 05/21/2021   Brief hospital course: 86 year old community dwelling white male, lives with his wife CAD CABG X1 HFpEF permanent A. fib CHADS2 score >4/Eliquis Parkinson's disease plus dementia IPF Recent hospitalization 12/25--12/27 HFpEF exacerbation--OV cardiology 04/27/2021 patient appeared to be in normal mental state  On admission tachypneic 22 O2 sat 94% lactic acid 2.8 troponin 9 lipase 24 EtOH 10 COVID influenza negative CXR cardiogenic failure cardiomegaly Developed significant agitation belligerent see and Haldol given to the patient in ED Psychiatry consulted, palliative care consulted    Assessment and Plan: * Acute hyperactive delirium due to advanced parkinsonism with Parkinson's dementia Dr. Quinn Axe signed off but is available if needed Seroquel 50 nightly-no Haldol Continue sertraline 50 daily, Geodon only as needed IM 20 mg. Behavior has improved some he is somewhat directable-I did not see his wife today we will reassess Lactic acidosis Sepsis ruled out, PCT less than 0.10 No further work-up needed at this time PAF (paroxysmal atrial fibrillation) ([place])- (present on admission) CHADS2 score >4, continue Eliquis 5 twice daily Coreg 6.25 twice daily Has bled score is elevated however patient has not had any falls recently Outpatient cardiology discussion is required regarding continuation of DOAC Hypokalemia Replaced with saline and K Periodic labs OSA on CPAP Continue CPAP 12 cm with full facemask if allows (HFpEF) heart failure with preserved ejection fraction ([place])- (present on admission) Not on any diuretic at this time-monitor trends of weights periodically Gout attack Patient on Uloric as an outpatient tarted colchicine based on him having pain on 05/19/2021 Monitor for diarrhea Follow-up clinically as an outpatient BPH (benign  prostatic hyperplasia) Continue Flomax 0.4      Subjective:   Fair no distress-arouses easily when I saw him Redirectable today to some degree but has persistence of ideas when I talk to him and become slightly confused   Physical Exam: Vitals:   05/21/21 0621 05/21/21 0755 05/21/21 1144 05/21/21 1541  BP: (!) 121/55 111/65 110/60 (!) 148/74  Pulse: 66 63 68 67  Resp: 18 17 19 19   Temp: 98.7 F (37.1 C) 97.7 F (36.5 C) 97.8 F (36.6 C) 98.1 F (36.7 C)  TempSrc: Axillary     SpO2: 98% 99% 99% 96%  Weight:      Height:       EOMI NCAT no focal deficit power 5/5 facies flat- No tremor S1-S2 no murmur no rub no gallop CTA B no added sound no rales no rhonchi Abdomen soft nontender no rebound No lower extremity edema Neurologically intact, Mast/flat affect  No change to exam   Data Reviewed: Labs not performed today   Family Communication: Discussed with wife at the bedside  Disposition: Status is: Inpatient Remains inpatient appropriate because: Needs placement  Planned Discharge Destination: Barriers to discharge: Unclear at this time-TOC working on proper disposition plan as patient is not easily redirectable  Needs a safer plan for discharge-Long conversation with Dr. Quinn Axe in addition who feels disposition planning and placement is preferable        Time spent: 37 minutes  Author: Nita Sells, MD 05/21/2021 5:01 PM  For on call review [email or web address].

## 2021-05-21 NOTE — Care Management Important Message (Signed)
Important Message  Patient Details  Name: Andre [name] MRN: [id number] Date of Birth: [birth year]   Medicare Important Message Given:  Yes  Mrs. Staron returned my call and I reviewed the Important Message from Medicare with her and she stated she understood the rights. She shared that she is looking at facilities that they could afford. I thanked her for calling me back.   Juliann Pulse A Adream Parzych 05/21/2021, 4:04 PM

## 2021-05-21 NOTE — Progress Notes (Signed)
Physical Therapy Treatment Patient Details Name: SAATHVIK EVERY MRN: [id number] DOB: 1935/12/28 Today's Date: 05/21/2021   History of Present Illness Pt admitted for delirium. HIstory includes Parkinson's dx, CAD, and HTN. Pt currently has sitter at bedside.    PT Comments    Patient tolerated session well and was agreeable to treatment. Patient very pleasant throughout session. No pain throughout session reported from patient. Entire session completed on 2L O2 via [place]. Upon arrival into room patient was very groggy, when patient placed in short sitting at EOB, consciousness improved.  He required increased assistance at ModA+2 to complete supine to sit with cueing for hand placement, however was able to complete sit to supine Min A with assistance at the feet. Patient was able to progress from [place] with RW to supervision without AD when completing sit to stands throughout session. In standing patient demonstrated fair balance with RW while required Max A to clear patient's behind. CGA-SBA w/ RW was required for ambulation around the room. Patient left in sitting position in bed with all needs met. Patient would continue to benefit from skilled physical therapy in order to improve noted deficits above. Continue to recommend HHPT upon discharge from acute hospitalization.    Recommendations for follow up therapy are one component of a multi-disciplinary discharge planning process, led by the attending physician.  Recommendations may be updated based on patient status, additional functional criteria and insurance authorization.  Follow Up Recommendations  Home health PT     Assistance Recommended at Discharge Frequent or constant Supervision/Assistance  Patient can return home with the following A little help with walking and/or transfers;A little help with bathing/dressing/bathroom;Help with stairs or ramp for entrance;Assistance with cooking/housework;Assist for transportation   Equipment  Recommendations  Other (comment) (Defer to next level of care)    Recommendations for Other Services       Precautions / Restrictions Precautions Precautions: Fall Restrictions Weight Bearing Restrictions: No     Mobility  Bed Mobility Overal bed mobility: Needs Assistance Bed Mobility: Supine to Sit, Sit to Supine     Supine to sit: Mod assist, +2 for physical assistance Sit to supine: Min assist   General bed mobility comments: VC for sequencing Patient Response: Cooperative, Flat affect  Transfers Overall transfer level: Needs assistance Equipment used: Rolling walker (2 wheels), None Transfers: Sit to/from Stand Sit to Stand: Min assist, Supervision, Min guard           General transfer comment: Initially patient Min A+1 with RW however through multiple reps was able to progress to supervision +1 w/o AD, +2 for safety    Ambulation/Gait Ambulation/Gait assistance: Min guard, Supervision Gait Distance (Feet): 50 Feet (5x10 feet from EOB to table (forward and backwards ambulation) CGA-Supervision) Assistive device: Rolling walker (2 wheels) Gait Pattern/deviations: Shuffle, Decreased dorsiflexion - right, Decreased dorsiflexion - left       General Gait Details: shuffling gait with RW use, no major LOB noted with forward or backwards ambulation   Stairs             Wheelchair Mobility    Modified Rankin (Stroke Patients Only)       Balance Overall balance assessment: Needs assistance Sitting-balance support: Feet supported, No upper extremity supported Sitting balance-Leahy Scale: Good     Standing balance support: No upper extremity supported, During functional activity Standing balance-Leahy Scale: Fair  Cognition Arousal/Alertness: Awake/alert Behavior During Therapy: WFL for tasks assessed/performed Overall Cognitive Status: No family/caregiver present to determine baseline cognitive  functioning Area of Impairment: Orientation, Attention, Following commands, Problem solving                 Orientation Level: Disoriented to, Place, Time, Situation Current Attention Level: Focused   Following Commands: Follows one step commands consistently, Follows multi-step commands inconsistently     Problem Solving: Slow processing, Requires verbal cues, Requires tactile cues General Comments: Initially groggy, but with lights on and verbal/tactile cues and mobility, improves, intermittent VC for safety, oriented to self and "hospital"        Exercises Other Exercises Other Exercises: x20 LAQ bilaterally with visual cueing from therapists hand; completed EOB Other Exercises: Seated marches sitting EOB x10 bilaterally Other Exercises: OT provied tactile/visual cue while PT instucted pt in seated LB exercises with noted improvement in technique Other Exercises: lights left on, tv on, and pt in chair position in the bed to support improved sleep/wake cycles Other Exercises: x10 Sit to stands from EOB; patient progressed from [place] with RW to Supervision without RW    General Comments        Pertinent Vitals/Pain Pain Assessment Pain Assessment: No/denies pain Pain Intervention(s): Limited activity within patient's tolerance, Monitored during session, Repositioned    Home Living                          Prior Function            PT Goals (current goals can now be found in the care plan section) Acute Rehab PT Goals Patient Stated Goal: unable to state PT Goal Formulation: Patient unable to participate in goal setting Time For Goal Achievement: 05/26/21 Potential to Achieve Goals: Good    Frequency    Min 2X/week      PT Plan Current plan remains appropriate    Co-evaluation PT/OT/SLP Co-Evaluation/Treatment: Yes Reason for Co-Treatment: Necessary to address cognition/behavior during functional activity;To address functional/ADL  transfers;For patient/therapist safety PT goals addressed during session: Mobility/safety with mobility;Balance;Proper use of DME OT goals addressed during session: ADL's and self-care      AM-PAC PT "6 Clicks" Mobility   Outcome Measure  Help needed turning from your back to your side while in a flat bed without using bedrails?: A Little Help needed moving from lying on your back to sitting on the side of a flat bed without using bedrails?: A Little Help needed moving to and from a bed to a chair (including a wheelchair)?: A Little Help needed standing up from a chair using your arms (e.g., wheelchair or bedside chair)?: A Little Help needed to walk in hospital room?: A Little Help needed climbing 3-5 steps with a railing? : A Lot 6 Click Score: 17    End of Session Equipment Utilized During Treatment: Gait belt Activity Tolerance: Patient tolerated treatment well Patient left: with call bell/phone within reach;in bed;with bed alarm set Nurse Communication: Mobility status PT Visit Diagnosis: Unsteadiness on feet (R26.81);Muscle weakness (generalized) (M62.81);Difficulty in walking, not elsewhere classified (R26.2)     Time: 1540-0867 PT Time Calculation (min) (ACUTE ONLY): 27 min  Charges:  $Therapeutic Activity: 8-22 mins                     Iva Boop, PT  05/21/21. 1:11 PM

## 2021-05-21 NOTE — Care Management Important Message (Signed)
Important Message  Patient Details  Name: Andre [name] MRN: [id number] Date of Birth: 1935-12-24   Medicare Important Message Given:  Other (see comment)  I tried contacting the patient's wife, Quince Santana ([phone] - this line no  longer in service) and I tried the landline [phone] but had to leave a message. I will await a return call.   Juliann Pulse A Inioluwa Boulay 05/21/2021, 2:15 PM

## 2021-05-22 LAB — COMPREHENSIVE METABOLIC PANEL
ALT: 16 U/L (ref 0–44)
AST: 22 U/L (ref 15–41)
Albumin: 3.3 g/dL — ABNORMAL LOW (ref 3.5–5.0)
Alkaline Phosphatase: 90 U/L (ref 38–126)
Anion gap: 7 (ref 5–15)
BUN: 24 mg/dL — ABNORMAL HIGH (ref 8–23)
CO2: 27 mmol/L (ref 22–32)
Calcium: 9 mg/dL (ref 8.9–10.3)
Chloride: 105 mmol/L (ref 98–111)
Creatinine, Ser: 0.92 mg/dL (ref 0.61–1.24)
GFR, Estimated: >60 mL/min (ref >60)
Glucose, Bld: 114 mg/dL — ABNORMAL HIGH (ref 70–99)
Potassium: 4.2 mmol/L (ref 3.5–5.1)
Sodium: 139 mmol/L (ref 135–145)
Total Bilirubin: 0.9 mg/dL (ref 0.3–1.2)
Total Protein: 6.4 g/dL — ABNORMAL LOW (ref 6.5–8.1)

## 2021-05-22 LAB — CBC WITH DIFFERENTIAL/PLATELET
Abs Immature Granulocytes: 0.03 10*3/uL (ref 0.00–0.07)
Basophils Absolute: 0 10*3/uL (ref 0.0–0.1)
Basophils Relative: 1 %
Eosinophils Absolute: 0.1 10*3/uL (ref 0.0–0.5)
Eosinophils Relative: 1 %
HCT: 47.7 % (ref 39.0–52.0)
Hemoglobin: 15.2 g/dL (ref 13.0–17.0)
Immature Granulocytes: 0 %
Lymphocytes Relative: 23 %
Lymphs Abs: 2 10*3/uL (ref 0.7–4.0)
MCH: 30.2 pg (ref 26.0–34.0)
MCHC: 31.9 g/dL (ref 30.0–36.0)
MCV: 94.6 fL (ref 80.0–100.0)
Monocytes Absolute: 0.8 10*3/uL (ref 0.1–1.0)
Monocytes Relative: 9 %
Neutro Abs: 5.7 10*3/uL (ref 1.7–7.7)
Neutrophils Relative %: 66 %
Platelets: 158 10*3/uL (ref 150–400)
RBC: 5.04 MIL/uL (ref 4.22–5.81)
RDW: 14.3 % (ref 11.5–15.5)
WBC: 8.7 10*3/uL (ref 4.0–10.5)
nRBC: 0 % (ref 0.0–0.2)

## 2021-05-22 NOTE — Progress Notes (Signed)
Mobility Specialist - Progress Note    05/22/21 1600  Mobility  Range of Motion/Exercises Left leg;Right leg  Level of Assistance Minimal assist, patient does 75% or more  Assistive Device None  Activity Response Tolerated well  $Mobility charge 1 Mobility    Pre-mobility: 70 HR, BP, 96% SpO2 Post-mobility: 78 HR, 95% SPO2  Pt was resting in bed upon arrival using RA. Pt noted tiredness and need encouragement to complete session.Pt declined OOB d/t recent transfer. Pt completed supine [place] with MinA for proper technique. Pt denied shortness of breath and fatigue post activity. Pt was left in bed with alarm set and needs in reach.  Kathee Delton Mobility Specialist 05/22/21, 4:24 PM

## 2021-05-22 NOTE — Progress Notes (Signed)
Stable--more coherent today Asks me where his wife is Thought she had passed Breakfast was uneaten at bedside Awaiting d/c to safe disposition Will call wife in am  No charge  Verneita Griffes, MD [facility] 5:19 PM

## 2021-05-23 MED ORDER — ONDANSETRON 4 MG PO TBDP
4.0000 mg | ORAL_TABLET | Freq: Three times a day (TID) | ORAL | Status: DC | PRN
  Administered 2021-05-23: 4 mg via ORAL
  Filled 2021-05-23 (×2): qty 1

## 2021-05-23 NOTE — Progress Notes (Signed)
°  Progress Note   Patient: Andre [name] YQI:[id number] DOB: 1936/03/15 DOA: 05/11/2021     12 DOS: the patient was seen and examined on 05/23/2021   Brief hospital course: 86 year old community dwelling white male, lives with his wife CAD CABG X1 HFpEF permanent A. fib CHADS2 score >4/Eliquis Parkinson's disease plus dementia IPF Recent hospitalization 12/25--12/27 HFpEF exacerbation--OV cardiology 04/27/2021 patient appeared to be in normal mental state  On admission tachypneic 22 O2 sat 94% lactic acid 2.8 troponin 9 lipase 24 EtOH 10 COVID influenza negative CXR cardiogenic failure cardiomegaly Developed significant agitation belligerent see and Haldol given to the patient in ED Psychiatry consulted, palliative care consulted    Assessment and Plan: * Acute hyperactive delirium due to advanced parkinsonism with Parkinson's dementia Dr. Quinn Axe signed off but is available if needed Seroquel 50 nightly-no Haldol Continue sertraline 50 daily, Geodon only as needed IM 20 mg. Behavior much improved-still slightly confused but overall redirectable Awaiting safe discharge plan per TOC as per prior notes Lactic acidosis Sepsis ruled out, PCT less than 0.10 No further work-up needed at this time PAF (paroxysmal atrial fibrillation) ([place])- (present on admission) CHADS2 score >4, continue Eliquis 5 twice daily Coreg 6.25 twice daily Has bled score is elevated-need outpatient discussion about this Resolved hypokalemia Periodic labs only OSA on CPAP CPAP 12 cm with full facemask -patient refusing to use (HFpEF) heart failure with preserved ejection fraction ([place])- (present on admission) Not on any diuretic at this time-monitor trends of weights periodically Gout attack Patient on Uloric as an outpatient tarted colchicine based on him having pain on 05/19/2021 Monitor for diarrhea Follow-up clinically as an outpatient BPH (benign prostatic hyperplasia) Continue Flomax  0.4      Subjective:   Arousable eating breakfast somewhat less confused-still confabulating-thinks that his wife "left him" No chest pain no fever Nursing staff does not report any untoward event overnight  Physical Exam: Vitals:   05/23/21 0006 05/23/21 0513 05/23/21 0841 05/23/21 1250  BP: 124/72 137/66 (!) 143/83 121/68  Pulse: 70 74 68 72  Resp: 16 18 18 18   Temp: 98.6 F (37 C) (!) 97.5 F (36.4 C) 97.8 F (36.6 C) (!) 97.5 F (36.4 C)  TempSrc:  Oral    SpO2: 100% 100% 98% 98%  Weight:      Height:       Thick neck Mallampati 4 Chest clear no rales rhonchi Abdomen soft obese nontender no rebound S1-S2 no murmur Neuro intact strength  Data Reviewed: Labs not performed today   Family Communication: Discussed with wife at the bedside  Disposition: Status is: Inpatient Remains inpatient appropriate because: Needs placement  Planned Discharge Destination: Barriers to discharge: Unclear at this time-TOC working on proper disposition plan as patient is not easily redirectable  Needs a safer plan for discharge-Long conversation with Dr. Quinn Axe in addition who feels disposition planning and placement is preferable     Time spent: 37 minutes  Author: Nita Sells, MD 05/23/2021 4:54 PM  For on call review [email or web address].

## 2021-05-24 MED ORDER — QUETIAPINE FUMARATE 25 MG PO TABS
75.0000 mg | ORAL_TABLET | Freq: Every day | ORAL | Status: DC
  Administered 2021-05-24: 21:00:00 75 mg via ORAL
  Filled 2021-05-24: qty 3

## 2021-05-24 NOTE — Progress Notes (Signed)
Long discussion with wife--discussed our specific limitations Medically is stable Discussed that she may need to "lean" on family members and see what other support she can garner--sent message to CSW to see options we have for support in the event that he has no placement options.  Verneita Griffes, MD [facility] 6:02 PM

## 2021-05-24 NOTE — Progress Notes (Signed)
Pt very agitated this shift. Removing external urinary catheter and throwing it into hallway, verbally aggressive, frequently getting out of bed - at one point he barricaded himself in bathroom and attempted to prevent staff from entering. Difficult to redirect for more than a few seconds. After trying his scheduled meds and tactics to decrease stimulation, administered prn geodon per [facility].

## 2021-05-24 NOTE — Progress Notes (Signed)
Occupational Therapy Treatment Patient Details Name: Andre [name] MRN: [id number] DOB: [birth year] Today's Date: 05/24/2021   History of present illness Pt admitted for delirium. HIstory includes Parkinson's dx, CAD, and HTN. Pt currently has sitter at bedside.   OT comments  Andre [name] was seen for OT treatment on this date. Upon arrival to room pt in bed with wife at bedside, pt eager for OOB mobility. Pt requires MIN A self-feeding -asssit from wife upon entering room, educated on completing with SETUP only. CGA + RW for ADL t/f, completes navigation task in hallway, tolerates ~300 ft mobility with MIN cues to ID room.   Pt completed the Orientation Log (O-Log) which measures place, time, and situational (Etiology/Event + Pathology/Deficits) domains over time. Patient responses are scored based on spontaneous recall, logical cueing, multiple choice, and incorrect responses. Pt scored 26/30. Pt making good progress toward goals. Pt continues to benefit from skilled OT services to maximize return to PLOF and minimize risk of future falls, injury, caregiver burden, and readmission. Will continue to follow POC. Discharge recommendation remains appropriate.     Recommendations for follow up therapy are one component of a multi-disciplinary discharge planning process, led by the attending physician.  Recommendations may be updated based on patient status, additional functional criteria and insurance authorization.    Follow Up Recommendations  Home health OT    Assistance Recommended at Discharge Frequent or constant Supervision/Assistance  Patient can return home with the following  A little help with walking and/or transfers;A little help with bathing/dressing/bathroom;Assistance with cooking/housework;Direct supervision/assist for medications management;Direct supervision/assist for financial management;Assist for transportation;Help with stairs or ramp for entrance   Equipment Recommendations   BSC/3in1;Tub/shower seat;Other (comment)    Recommendations for Other Services      Precautions / Restrictions Precautions Precautions: Fall Restrictions Weight Bearing Restrictions: No       Mobility Bed Mobility Overal bed mobility: Needs Assistance Bed Mobility: Supine to Sit, Sit to Supine     Supine to sit: Min assist Sit to supine: Min assist        Transfers Overall transfer level: Needs assistance Equipment used: Rolling walker (2 wheels), None Transfers: Sit to/from Stand Sit to Stand: Min guard           General transfer comment: pt picks up RW and walks with device ~3 inches off of ground at end of session     Balance Overall balance assessment: Needs assistance Sitting-balance support: Feet supported, No upper extremity supported Sitting balance-Leahy Scale: Good     Standing balance support: No upper extremity supported, During functional activity Standing balance-Leahy Scale: Fair                             ADL either performed or assessed with clinical judgement   ADL Overall ADL's : Needs assistance/impaired                                       General ADL Comments: MIN A self-feeding -asssit from wife upon entering room, educated on completing with SETUP only. CGA + RW for ADL t/f, completes navigation task in hallway, tolerates ~300 ft mobility with MIN cues to ID room      Cognition Arousal/Alertness: Awake/alert Behavior During Therapy: [facility] for tasks assessed/performed Overall Cognitive Status: Impaired/Different from baseline  General Comments: scored 26/30 on orientation log                   Pertinent Vitals/ Pain       Pain Assessment Pain Assessment: Faces Faces Pain Scale: Hurts a little bit Pain Location: balls of B feet Pain Descriptors / Indicators: Discomfort Pain Intervention(s): Limited activity within patient's tolerance,  Repositioned   Frequency  Min 2X/week        Progress Toward Goals  OT Goals(current goals can now be found in the care plan section)  Progress towards OT goals: Progressing toward goals  Acute Rehab OT Goals Patient Stated Goal: to go home OT Goal Formulation: With patient/family Time For Goal Achievement: 05/27/21 Potential to Achieve Goals: Good ADL Goals Pt Will Perform Lower Body Dressing: with supervision Pt Will Transfer to Toilet: with supervision Pt Will Perform Toileting - Clothing Manipulation and hygiene: with supervision  Plan Frequency remains appropriate;Discharge plan remains appropriate    Co-evaluation                 AM-PAC OT "6 Clicks" Daily Activity     Outcome Measure   Help from another person eating meals?: None Help from another person taking care of personal grooming?: None Help from another person toileting, which includes using toliet, bedpan, or urinal?: A Little Help from another person bathing (including washing, rinsing, drying)?: A Lot Help from another person to put on and taking off regular upper body clothing?: A Little Help from another person to put on and taking off regular lower body clothing?: A Little 6 Click Score: 19    End of Session Equipment Utilized During Treatment: Gait belt;Rolling walker (2 wheels)  OT Visit Diagnosis: Unsteadiness on feet (R26.81);Muscle weakness (generalized) (M62.81);Other symptoms and signs involving cognitive function   Activity Tolerance Patient tolerated treatment well   Patient Left in bed;with call bell/phone within reach;with bed alarm set;with family/visitor present   Nurse Communication Mobility status        Time: 1430-1445 OT Time Calculation (min): 15 min  Charges: OT General Charges $OT Visit: 1 Visit OT Treatments $Therapeutic Activity: 8-22 mins  Dessie Coma, M.S. OTR/L  05/24/21, 3:03 PM  ascom [phone]

## 2021-05-24 NOTE — TOC Progression Note (Signed)
Transition of Care [facility]) - Progression Note    Patient Details  Name: Andre [name] MRN: [id number] Date of Birth: 01/30/1936  Transition of Care [facility]) CM/SW Contact  Eileen Stanford, LCSW Phone Number: 05/24/2021, 1:48 PM  Clinical Narrative:   Per [facility] Department Supervisor the plan remains that pt will dc home with spouse once medically stable for dc. CSW relayed this message to staff in Progression Rounds. Pt is not medically stable for dc at this time.         Expected Discharge Plan and Services                                                 Social Determinants of Health (SDOH) Interventions    Readmission Risk Interventions No flowsheet data found.

## 2021-05-24 NOTE — Progress Notes (Signed)
°  Progress Note   Patient: Andre [name] DOB: 10-28-1935 DOA: 05/11/2021     13 DOS: the patient was seen and examined on 05/24/2021   Brief hospital course: 86 year old community dwelling white male, lives with his wife CAD CABG X1 HFpEF permanent A. fib CHADS2 score >4/Eliquis Parkinson's disease plus dementia IPF Recent hospitalization 12/25--12/27 HFpEF exacerbation--OV cardiology 04/27/2021 patient appeared to be in normal mental state  On admission tachypneic 22 O2 sat 94% lactic acid 2.8 troponin 9 lipase 24 EtOH 10 COVID influenza negative CXR cardiogenic failure cardiomegaly Developed significant agitation belligerent see and Haldol given to the patient in ED Psychiatry consulted, palliative care consulted    Assessment and Plan: * Acute hyperactive delirium due to advanced parkinsonism with Parkinson's dementia Dr. Quinn Axe signed off but is available if needed Seroquel increased from 50-->75 nightly-no Haldol Continue sertraline 50 daily, Geodon only as needed IM 20 mg. Behavior much improved-still slightly confused but overall redirectable Awaiting safe discharge plan per TOC as per prior notes Lactic acidosis Sepsis ruled out, PCT less than 0.10 No further work-up needed at this time PAF (paroxysmal atrial fibrillation) ([place])- (present on admission) CHADS2 score >4, continue Eliquis 5 twice daily Coreg 6.25 twice daily Has bled score is elevated-need outpatient discussion about this Resolved hypokalemia Periodic labs only OSA on CPAP CPAP 12 cm with full facemask -patient refusing to use (HFpEF) heart failure with preserved ejection fraction ([place])- (present on admission) Not on any diuretic at this time-monitor trends of weights periodically Gout attack Patient on Uloric as an outpatient started colchicine based on him having pain on 05/19/2021 Monitor for diarrhea Follow-up clinically as an outpatient BPH (benign prostatic hyperplasia) Continue Flomax  0.4      Subjective:   Events overnight noted--was confused-pulling at things Arousable overall fair He was walking comfortably in the hallway earlier today with therapy--looked strong   Physical Exam: Vitals:   05/24/21 0603 05/24/21 0834 05/24/21 1214 05/24/21 1609  BP: 123/69 (!) 160/96 (!) 152/83 120/74  Pulse: 70 65 70 82  Resp: 20 18 16 16   Temp: 98.8 F (37.1 C) 97.7 F (36.5 C) 97.6 F (36.4 C) 98.1 F (36.7 C)  TempSrc:  Oral Oral   SpO2: 94% 94% 98% 95%  Weight:      Height:       Thick neck Mallampati 4 Chest clear no rales rhonchi Abdomen soft obese nontender no rebound S1-S2 no murmur Neuro intact strength  Data Reviewed: Labs not performed today   Family Communication: Discussed with wife at the bedside  Disposition: Status is: Inpatient Remains inpatient appropriate because: Needs placement  Planned Discharge Destination: Barriers to discharge: Unclear at this time-TOC working on proper disposition plan as patient is not easily redirectable  Needs a safer plan for discharge-Long conversation with Dr. Quinn Axe in addition who feels disposition planning and placement is preferable     Time spent: 17 minutes  Author: Nita Sells, MD 05/24/2021 4:33 PM  For on call review [email or web address].

## 2021-05-24 NOTE — Progress Notes (Signed)
Patient has been resting quietly, even tolerating CPAP since 0100.

## 2021-05-24 NOTE — Progress Notes (Signed)
Physical Therapy Treatment Patient Details Name: Andre [name] MRN: [id number] DOB: 1935-06-13 Today's Date: 05/24/2021   History of Present Illness Pt admitted for delirium. HIstory includes Parkinson's dx, CAD, and HTN. Pt currently has sitter at bedside.    PT Comments    Patient tolerated session well, very upbeat, and was agreeable to treatment. Upon entering room patient was supine with HOB slightly elevated resting. Patient awoke easily to his name. No pain reported at beginning of session, however did report mild pain on L foot during functional activity. Patient was able to demonstrate sit<>supine bed mobility at supervision with significant use of UE support from bed rails to pull up into sitting. He was also able to demonstrate sit to stand transfer at supervision with no AD. X10 more reps were performed for BLE strengthening. EOB there-ex focused on lower extremity strengthening. Patient was then able to demonstrate ambulating around the nurses station at CGA-SBA with RW with verbal cueing on directions. No major LOB or unsteadiness noted. Patient left in bed with all needs met talking with MD. Patient would continue to benefit from skilled physical therapy in order to optimize patient's return to PLOF. Continue to recommend HHPT upon discharge from acute hospitalization.    Recommendations for follow up therapy are one component of a multi-disciplinary discharge planning process, led by the attending physician.  Recommendations may be updated based on patient status, additional functional criteria and insurance authorization.  Follow Up Recommendations  Home health PT     Assistance Recommended at Discharge Frequent or constant Supervision/Assistance  Patient can return home with the following A little help with walking and/or transfers;A little help with bathing/dressing/bathroom;Help with stairs or ramp for entrance;Assistance with cooking/housework;Assist for transportation    Equipment Recommendations  Other (comment) (defer to next level of care)    Recommendations for Other Services       Precautions / Restrictions Precautions Precautions: Fall Restrictions Weight Bearing Restrictions: No     Mobility  Bed Mobility Overal bed mobility: Needs Assistance Bed Mobility: Supine to Sit, Sit to Supine     Supine to sit: Supervision Sit to supine: Supervision     Patient Response: Cooperative, Flat affect  Transfers Overall transfer level: Needs assistance Equipment used: Rolling walker (2 wheels), None Transfers: Sit to/from Stand Sit to Stand: Supervision, Min guard           General transfer comment: Initially patient CGA however through multiple reps was able to progress to supervision with no AD    Ambulation/Gait Ambulation/Gait assistance: Min guard, Supervision Gait Distance (Feet): 160 Feet Assistive device: Rolling walker (2 wheels) Gait Pattern/deviations: Shuffle, Decreased dorsiflexion - right, Decreased dorsiflexion - left Gait velocity: decreased     General Gait Details: shuffling gait with RW use, no major LOB noted, verbal cueing for direction back to room   Stairs             Wheelchair Mobility    Modified Rankin (Stroke Patients Only)       Balance Overall balance assessment: Needs assistance Sitting-balance support: Feet supported, No upper extremity supported Sitting balance-Leahy Scale: Good     Standing balance support: No upper extremity supported, During functional activity Standing balance-Leahy Scale: Fair                              Cognition Arousal/Alertness: Awake/alert Behavior During Therapy: WFL for tasks assessed/performed Overall Cognitive Status: No family/caregiver present to determine  baseline cognitive functioning Area of Impairment: Orientation, Attention, Following commands, Problem solving                 Orientation Level: Disoriented to,  Place Current Attention Level: Focused   Following Commands: Follows one step commands consistently, Follows multi-step commands inconsistently     Problem Solving: Slow processing, Requires verbal cues, Requires tactile cues          Exercises Other Exercises Other Exercises: x20 seated marches bilaterally sitting EOB completed Mod I Other Exercises: x10 LAQ bilaterally sitting EOB with BUE support on bed for stability, visual cueing of therapists hand as target Other Exercises: x10 sit to stands from EOB at supervision; patient completed without RW for stability Other Exercises: x20 ankle pumps bilaterally sitting EOB    General Comments        Pertinent Vitals/Pain Pain Assessment Pain Assessment: 0-10 Faces Pain Scale: Hurts a little bit Pain Location: L foot Pain Descriptors / Indicators: Discomfort Pain Intervention(s): Limited activity within patient's tolerance, Monitored during session, Repositioned    Home Living                          Prior Function            PT Goals (current goals can now be found in the care plan section) Acute Rehab PT Goals Patient Stated Goal: unable to state PT Goal Formulation: Patient unable to participate in goal setting Time For Goal Achievement: 05/26/21 Potential to Achieve Goals: Good Progress towards PT goals: Progressing toward goals    Frequency    Min 2X/week      PT Plan Current plan remains appropriate    Co-evaluation              AM-PAC PT "6 Clicks" Mobility   Outcome Measure  Help needed turning from your back to your side while in a flat bed without using bedrails?: A Little Help needed moving from lying on your back to sitting on the side of a flat bed without using bedrails?: A Little Help needed moving to and from a bed to a chair (including a wheelchair)?: A Little Help needed standing up from a chair using your arms (e.g., wheelchair or bedside chair)?: A Little Help needed to  walk in hospital room?: A Little Help needed climbing 3-5 steps with a railing? : A Lot 6 Click Score: 17    End of Session Equipment Utilized During Treatment: Gait belt Activity Tolerance: Patient tolerated treatment well Patient left: with call bell/phone within reach;in bed;with bed alarm set (with MD in room) Nurse Communication: Mobility status PT Visit Diagnosis: Unsteadiness on feet (R26.81);Muscle weakness (generalized) (M62.81);Difficulty in walking, not elsewhere classified (R26.2)     Time: 6256-3893 PT Time Calculation (min) (ACUTE ONLY): 20 min  Charges:  $Therapeutic Activity: 8-22 mins                     Iva Boop, PT  05/24/21. 1:57 PM

## 2021-05-25 ENCOUNTER — Telehealth: Payer: Self-pay | Admitting: Cardiovascular Disease

## 2021-05-25 LAB — BASIC METABOLIC PANEL
Anion gap: 6 (ref 5–15)
BUN: 22 mg/dL (ref 8–23)
CO2: 26 mmol/L (ref 22–32)
Calcium: 8.8 mg/dL — ABNORMAL LOW (ref 8.9–10.3)
Chloride: 105 mmol/L (ref 98–111)
Creatinine, Ser: 1.01 mg/dL (ref 0.61–1.24)
GFR, Estimated: >60 mL/min (ref >60)
Glucose, Bld: 102 mg/dL — ABNORMAL HIGH (ref 70–99)
Potassium: 4 mmol/L (ref 3.5–5.1)
Sodium: 137 mmol/L (ref 135–145)

## 2021-05-25 MED ORDER — QUETIAPINE FUMARATE 25 MG PO TABS
75.0000 mg | ORAL_TABLET | Freq: Every day | ORAL | 2 refills | Status: DC
Start: 2021-05-25 — End: 2021-08-02

## 2021-05-25 NOTE — Progress Notes (Signed)
Andre [name] to be D/C'd Home per MD order.  Discussed prescriptions and follow up appointments with the patient and wife. Prescriptions given to patient, medication list explained in detail. Pt verbalized understanding. Wife and daughter here to transport pt home.  Allergies as of 05/25/2021       Reactions   Pravastatin Other (See Comments)   Prednisone Other (See Comments)   Pt states that med makes him hyper Pt states that med makes him hyper        Medication List     STOP taking these medications    Flutter Devi   furosemide 40 MG tablet Commonly known as: LASIX   Gemtesa 75 MG Tabs Generic drug: Vibegron   lactulose 10 GM/15ML solution Commonly known as: CHRONULAC   potassium chloride 10 MEQ tablet Commonly known as: KLOR-CON M       TAKE these medications    albuterol 108 (90 Base) MCG/ACT inhaler Commonly known as: VENTOLIN HFA Inhale 2 puffs into the lungs every 6 (six) hours as needed for wheezing or shortness of breath.   buPROPion 300 MG 24 hr tablet Commonly known as: WELLBUTRIN XL TAKE ONE TABLET BY MOUTH EVERY DAY   carbidopa-levodopa 25-250 MG tablet Commonly known as: SINEMET IR Take 0.5 tablets by mouth 4 (four) times daily.   carvedilol 3.125 MG tablet Commonly known as: COREG TAKE TWO TABLETS TWICE A DAY WITH MEALS What changed: See the new instructions.   Eliquis 5 MG Tabs tablet Generic drug: apixaban TAKE ONE TABLET BY MOUTH TWICE DAILY   entacapone 200 MG tablet Commonly known as: COMTAN TAKE 1 TABLET BY MOUTH 3 TIMES DAILY   esomeprazole 40 MG capsule Commonly known as: NEXIUM TAKE 1 CAPSULE BY MOUTH ONCE DAILY   feeding supplement Liqd Take 237 mLs by mouth 3 (three) times daily between meals.   fluticasone 50 MCG/ACT nasal spray Commonly known as: FLONASE USE 2 PUFFS IN EACH NOSTRIL DAILY What changed: See the new instructions.   hydrocortisone 2.5 % lotion For seborrheic dermatitis of the face apply to aa's QAM on  Tuesday, Thursday, and Saturday.   ketoconazole 2 % cream Commonly known as: NIZORAL Apply a thin coat to face QAM on Monday, Wednesday, and Friday.   ketoconazole 2 % shampoo Commonly known as: NIZORAL Shampoo into the face and scalp let sit 5 minutes then wash off. Use 3d/wk.   levothyroxine 50 MCG tablet Commonly known as: SYNTHROID TAKE ONE TABLET ON AN EMPTY STOMACH WITHA GLASS OF WATER AT LEAST 30 TO 60 MINUTES BEFORE BREAKFAST What changed: See the new instructions.   QUEtiapine 25 MG tablet Commonly known as: SEROQUEL Take 3 tablets (75 mg total) by mouth at bedtime. What changed:  how much to take additional instructions   rosuvastatin 10 MG tablet Commonly known as: CRESTOR TAKE 1 TABLET BY MOUTH DAILY   sertraline 50 MG tablet Commonly known as: ZOLOFT TAKE 1 TABLET BY MOUTH DAILY   silodosin 8 MG Caps capsule Commonly known as: RAPAFLO TAKE 1 CAPSULE BY MOUTH ONCE DAILY WITH BREAKFAST        Vitals:   05/25/21 0818 05/25/21 1219  BP: (!) 149/75 134/72  Pulse: 63 71  Resp: 17 17  Temp: 98.4 F (36.9 C) 98.6 F (37 C)  SpO2: 97% 97%    Skin clean, dry and intact without evidence of skin break down, no evidence of skin tears noted. IV catheter discontinued intact. Site without signs and symptoms of complications. Dressing  and pressure applied. Pt denies pain at this time. No complaints noted.  An After Visit Summary was printed and given to the patient. Patient escorted via [place], and D/C home via private auto.  Rolley Sims

## 2021-05-25 NOTE — Telephone Encounter (Signed)
Patient spouse calling  States in hospital they advised patient to stop taking lasix Would like to know if Dr Fletcher Anon agrees with this change Please call to discuss

## 2021-05-25 NOTE — TOC Progression Note (Addendum)
Transition of Care [facility]) - Progression Note    Patient Details  Name: Andre [name] MRN: [id number] Date of Birth: 1935/12/21  Transition [facility] Care [facility]) CM/SW Contact  Eileen Stanford, LCSW Phone Number: 05/25/2021, 10:38 AM  Clinical Narrative:   CSW called spouse and spoke with her in detail in regards to dc home. Pt's spouse states she believes that pt will do better this time around at home. Pt's spouse states she has the resources if there is a crisis and its 911. She said she will call that in a emergency. CSW explained the [place] rec however explained that CSW had reached out to several [facility] agencies and no one is able to accept at this time. Pt's spouse is agreeable to outpatient here in front of hospital. Pt's spouse states she thinks it will be good for pt to get out and go to appointments. Pt's spouse states pt has a nice walker at home. Pt's spouse states her daughter and son in law are headed to the house now to come with the pt's spouse to pick pt up.       Expected Discharge Plan and Services           Expected Discharge Date: 05/25/21                                     Social Determinants of Health (SDOH) Interventions    Readmission Risk Interventions No flowsheet data found.

## 2021-05-25 NOTE — TOC Transition Note (Signed)
Transition of Care [facility]) - CM/SW Discharge Note   Patient Details  Name: Andre [name] MRN: [id number] Date of Birth: [birth year]  Transition of Care [facility]) CM/SW Contact:  Eileen Stanford, LCSW Phone Number: 05/25/2021, 11:04 AM   Clinical Narrative:   CSW spoke with pt's spouse and she is aware of dc. Outpatient referral faxed to [facility] facility per pt's spouse request. Pt is waiting on ride.     Final next level of care: Home/Self Care Barriers to Discharge: No Barriers Identified   Patient Goals and CMS Choice        Discharge Placement                  Name of family member notified: Spouse Patient and family notified of of transfer: 05/25/21  Discharge Plan and Services                                     Social Determinants of Health (SDOH) Interventions     Readmission Risk Interventions No flowsheet data found.

## 2021-05-25 NOTE — Telephone Encounter (Signed)
Will route to Dr. Arida to advise. 

## 2021-05-25 NOTE — Telephone Encounter (Signed)
They do not mention the reason of why they stopped it.  He should resume Lasix 40 mg daily.
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
## 2021-05-25 NOTE — Clinical Social Work Note (Signed)
Occupational Therapy * Physical Therapy * Speech Therapy          DATE ___2/7/23________________ PATIENT NAME_____[name] _____ PATIENT MRN__[id number]____________  DIAGNOSIS/DIAGNOSIS CODE _Acute hyperactive delirium due to advanced parkinsonism with Parkinson's dementia/F05_____________________  DATE OF DISCHARGE: ___2/7/23___________  PRIMARY CARE PHYSICIAN _____Eric [name] _____________________ PCP PHONE/FAX___[phone]________________________     Dear Provider (Name: __________________   Fax: ___________________________):   I certify that I have examined this patient and that occupational/physical/speech therapy is necessary on an outpatient basis.    The patient has expressed interest in completing their recommended course of therapy at your location.  Once a formal order from the patient's primary care physician has been obtained, please contact him/her to schedule an appointment for evaluation at your earliest convenience.   [ X ]  Physical Therapy Evaluate and Treat    [ X]  Occupational Therapy Evaluate and Treat       [  ]  Speech Therapy Evaluate and Treat       The patient's primary care physician (listed above) must furnish and be responsible for a formal order such that the recommended services may be furnished while under the primary physician's care, and that the plan of care will be established and reviewed every 30 days (or more often if condition necessitates).   MD electronic signature noted below.

## 2021-05-25 NOTE — Progress Notes (Signed)
Palliative: Chart review completed.  Mr. Thibeau appears to be stabilizing toward discharge.  He was evaluated by community hospice on February 2, but was not ready for discharge at that point.  He would clearly benefit from at home hospice care.  Mr. Wixom is sitting in the [place] chair in his room.  He greets me, making and mostly keeping eye contact.  He is alert and oriented to person place and situation.  He is able to make his basic needs known.  There is no family at bedside at this time.  We talk about his discharge today.  He shares that he is ready to get home.  He shares his concerns about his behavior over the last few days and weeks.  He shares that he has never been an aggressive or violent person.  We talk about the neurological changes that he has experienced in the importance of working with his doctors for management of his medicines.  We talk about discharging home with home health for strength and balance.  Mr. Mauger tells me that he used to run 26 mile marathons.  We also talked about the benefits of "treat the treatable" hospice care with [facility].  We talked about the benefits of hospice including aides to help with bathing of an RN for monitoring, a service that would last for many months and support both he and his wife.  He is agreeable to hospice referral.  Conference with attending, bedside nursing staff, transition of care team related to patient condition, needs, disposition. DNR on chart.  Plan: Discharge home with home health.  Transitioning to "treat the treatable" hospice care with [facility] hosp[facility].   21 minutes      Quinn Axe, NP Palliative Medicine Team  Team phone [phone] Greater than 50% of this time was spent counseling and coordinating care related to the above assessment and plan.

## 2021-05-26 ENCOUNTER — Other Ambulatory Visit: Payer: Self-pay

## 2021-05-26 ENCOUNTER — Ambulatory Visit (INDEPENDENT_AMBULATORY_CARE_PROVIDER_SITE_OTHER): Payer: HMO | Admitting: Family Medicine

## 2021-05-26 ENCOUNTER — Telehealth: Payer: Self-pay | Admitting: Family Medicine

## 2021-05-26 ENCOUNTER — Encounter: Payer: Self-pay | Admitting: Family Medicine

## 2021-05-26 VITALS — BP 130/70 | HR 74 | Temp 97.6°F | Ht 66.0 in | Wt 218.0 lb

## 2021-05-26 DIAGNOSIS — G20 Parkinson's disease: Secondary | ICD-10-CM

## 2021-05-26 DIAGNOSIS — M10079 Idiopathic gout, unspecified ankle and foot: Secondary | ICD-10-CM | POA: Diagnosis not present

## 2021-05-26 DIAGNOSIS — F0280 Dementia in other diseases classified elsewhere without behavioral disturbance: Secondary | ICD-10-CM
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
## 2021-05-26 NOTE — Telephone Encounter (Signed)
Pt wife called in stating that Pt was seen by Dr. [name] today. Pt wife stated that Dr. [name] advise Pt he will prescribe Pt medication for gout. Pt wife stated that [facility] and the pharmacist advise her there was no medication for pickup for Pt. Pt wife requesting callback.

## 2021-05-26 NOTE — Addendum Note (Signed)
Addended by: [name], Alexiz Cothran G on: 05/26/2021 01:11 PM   Modules accepted: Orders

## 2021-05-26 NOTE — Telephone Encounter (Signed)
Returned the patiens wife call.  DPR on file. Lmom with Dr. Tyrell Antonio response and recommendation. Pt wife is to call back if any questions.

## 2021-05-26 NOTE — Telephone Encounter (Signed)
I called the patients wife  and LVM for her to call back to inform her that the medication the provider was prescribing for the patient would have a bad interaction to his heart medication, so the provider sent a message to the patients cardiologist and he is waiting on a response back form cardiology before he prescribed the medication for gout.

## 2021-05-26 NOTE — Assessment & Plan Note (Addendum)
The patient seems to have had progression of his issues with dementia and his Parkinson's.  I discussed that he could continue to have issues with delusions resulting in violent behavior or may never have another issue with it.  He will continue his Seroquel 75 mg daily.  His wife will contact the neurologist to set up follow-up.  I counseled the patient's wife and the patient on contacting 911 if he were to become delusional and violent again.  Discussed that she would need to have him evaluated if he seems to be a threat to himself or anybody else.  Discussed if she felt he was a threat to her she needs to avoid him and contact the authorities.  I discussed the potential for memory care placement though she does not want to go that route at this time.  I also discussed the potential for commitment if he has recurrent issues where he is a threat to himself or others.  He will also be referred for physical therapy at the advice of inpatient physical therapy.

## 2021-05-26 NOTE — Patient Instructions (Addendum)
Nice to see you. I have placed a referral for physical therapy. I am going to check with your cardiologist to see if we can switch the carvedilol to something different so that we can treat your gout with colchicine.  We will let you know what he says.

## 2021-05-26 NOTE — Assessment & Plan Note (Signed)
The patient does not appear to be on any medication for this at this time.  I discussed treatment with colchicine though upon trying to prescribe this there was an interaction with his carvedilol.  I will communicate with his cardiologist to see if we may change his carvedilol to an alternative beta-blocker so that we can treat his gout flare with colchicine.  Prednisone is not a good option given his issues with delirium recently.

## 2021-05-27 NOTE — Telephone Encounter (Signed)
Pt wife called and I read message to pt wife and pt wife said she will call cardiologist to see what is going on

## 2021-05-28 ENCOUNTER — Telehealth: Payer: Self-pay | Admitting: Family Medicine

## 2021-05-28 MED ORDER — METOPROLOL TARTRATE 25 MG PO TABS: 25.0000 mg | ORAL_TABLET | Freq: Two times a day (BID) | ORAL | 3 refills | Status: DC

## 2021-05-28 MED ORDER — COLCHICINE 0.6 MG PO TABS: ORAL_TABLET | ORAL | 0 refills | Status: DC

## 2021-05-28 NOTE — Addendum Note (Signed)
Addended by: [name] on: 05/28/2021 04:48 PM   Modules accepted: Orders
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
## 2021-05-28 NOTE — Telephone Encounter (Signed)
[facility] Doreen Salvage called in requesting nurse to call back @ [phone] for safety concerns about patient

## 2021-05-28 NOTE — Telephone Encounter (Signed)
Please let the patient's wife know that I heard back from the cardiologist.  I am going to switch his carvedilol to metoprolol.  He will start this tomorrow.  He can start the colchicine tomorrow for his gout.  Please also advise that if he develops any muscle aches while taking the colchicine he needs to discontinue the colchicine and his Crestor.  Once the gout flare is over he would discontinue the colchicine.

## 2021-05-28 NOTE — Telephone Encounter (Signed)
I am aware of the safety concerns and discussed them with the patient and the wife during his visit. The physicians at the hospital discussed these things with her as well. I advised her on safety precautions during his recent visit.

## 2021-05-28 NOTE — Telephone Encounter (Signed)
Please let her know I have not heard back from the cardiologist yet. I am going to send another message and will send them a message as soon as I hear back from them even if it is over the weekend.

## 2021-05-28 NOTE — Telephone Encounter (Signed)
I called and spoke with the patient's wife and informed her that the provider has not heard from the cardiology and as soon as he does he will let her know and she understood.  Bronte Kropf,cma

## 2021-05-28 NOTE — Telephone Encounter (Signed)
I called and spoke with the patients wife and informed him that the medication carvedilol would be switched to metoprolol and that the patient can start the colchicine for the gout flare, I also informed her that if he gets any muscle pain to stop the colchicine and the Crestor and when the gout flare is gone stop the colchicine and she stated she understood and wrote everything down.  Angee Gupton,cma

## 2021-05-28 NOTE — Telephone Encounter (Addendum)
Pt wife called in requesting update on medication that Dr. [name] was going to prescribe pt. Pt wife was wondering if Dr. [name] heard anything from cardiologist. Pt wife requesting Dr. [name] to place order for in home therapy for pt. Pt requesting callback

## 2021-05-31 ENCOUNTER — Other Ambulatory Visit: Payer: Self-pay | Admitting: Family Medicine

## 2021-06-01 ENCOUNTER — Telehealth: Payer: Self-pay | Admitting: *Deleted

## 2021-06-01 NOTE — Telephone Encounter (Signed)
Patient needs to reschedule appointment. ?

## 2021-06-02 ENCOUNTER — Encounter: Payer: Self-pay | Admitting: Internal Medicine

## 2021-06-08 ENCOUNTER — Ambulatory Visit: Payer: HMO | Admitting: Cardiovascular Disease

## 2021-06-09 ENCOUNTER — Ambulatory Visit: Payer: HMO | Admitting: Family Medicine

## 2021-06-09 ENCOUNTER — Other Ambulatory Visit: Payer: Self-pay | Admitting: Internal Medicine

## 2021-06-12 ENCOUNTER — Other Ambulatory Visit: Payer: Self-pay | Admitting: Family Medicine

## 2021-06-16 ENCOUNTER — Telehealth: Payer: Self-pay | Admitting: Neurology

## 2021-06-16 NOTE — Telephone Encounter (Signed)
I called wife of pt.  I relayed not sure why he is sleeping a lot.   She can call me back, or speak to pcp, hard to say why he is sleeping.  ?

## 2021-06-16 NOTE — Telephone Encounter (Signed)
Wife is concerned that pt is sleeping around 15 hours a day, she is asking if she should be concerned, please call.  ?

## 2021-06-17 NOTE — Telephone Encounter (Signed)
I called wife again, LMVM that following up on pt.   ?

## 2021-06-17 NOTE — Telephone Encounter (Signed)
[name] returned the call.  She states he has slept a lot off and on for a while, but in the last 1 to 2 weeks he has slept more often.  She states he may wake up around 12 -1 PM, one day he woke up at 3 PM.  He goes to bed around 10 PM. He may wake up overnight and talk or have a snack, then goes back to bed. He might wake up to use the restroom in the morning but otherwise sleeps late.  Every day is different so its hard for her to pinpoint. He had a 2 week hospitalization from 05/11/21-05/25/21. Was having issues with behavior while there so his Seroquel was increased to 75 mg at night from previous dose of 50 mg. She states the increased dose doesn't seem to correlate with the increase in sleep because even with that dose he will wake up overnight. I did encourage her to call PCP to see if he can be seen. Review medications, ensure nothing else going on to cause the increase in sleep. Also said I would consult Dr Rexene Alberts. She was appreciative.  ?

## 2021-06-17 NOTE — Telephone Encounter (Signed)
I called wife.  She had been to pcp for f/u after admission for AMS. He recommended to continue taking the seroquel 75mg  po qhs.  I relayed Dr. Tori Milks recommendation per below.  She will contact pcp about geriatric psychiatry in there area.  Saw Neuro and Psych in [facility].  Placed on waitlist. Has appt with [place] 08-02-2021.  I mentioned respite care for her to get some rest, but stated pt did not like to be with a stranger.   ?

## 2021-06-17 NOTE — Telephone Encounter (Signed)
Please ask wife to check who is prescribing the Seroquel at this point.  I would not feel comfortable prescribing this for him at this juncture.  He may need to talk to primary care about this, he may benefit from seeing a geriatric psychiatrist, he may have been referred to psychiatry as he was in the hospital?  I recommend that she discuss this with his primary care as well.  ?

## 2021-06-17 NOTE — Telephone Encounter (Signed)
LMVM for pts wife to return call. (X 3) ?

## 2021-06-29 ENCOUNTER — Other Ambulatory Visit: Payer: Self-pay | Admitting: *Deleted

## 2021-06-29 DIAGNOSIS — D696 Thrombocytopenia, unspecified: Secondary | ICD-10-CM

## 2021-06-29 DIAGNOSIS — D751 Secondary polycythemia: Secondary | ICD-10-CM

## 2021-07-01 ENCOUNTER — Telehealth: Payer: Self-pay | Admitting: Family Medicine

## 2021-07-01 DIAGNOSIS — Z961 Presence of intraocular lens: Secondary | ICD-10-CM | POA: Diagnosis not present

## 2021-07-01 NOTE — Telephone Encounter (Signed)
[place] [facility] called in to see if Dr. [name] would like to be the attending physician for hospice care for pt. Andre [name] stated that there office received a referral for hospice care from [facility]. Andre [name] stated if Dr. [name] have any questions to feel free to contact her at [phone] ?

## 2021-07-02 ENCOUNTER — Telehealth: Payer: Self-pay | Admitting: *Deleted

## 2021-07-02 NOTE — Telephone Encounter (Signed)
That is fine as long as the hospice physician manages all pain medications and comfort measures. ?

## 2021-07-02 NOTE — Telephone Encounter (Signed)
Wife called and left a detailed vm that she was returning the clinic's call regarding her husband's apt on 3/20 with Dr. B. I returned the wife's call. I explained to her that Dr. B will be out of the country for 3 weeks. Wife would like to see if an NP could still see her husband on this day so they do not need to r/s the date.  Wife discussed that pt's dementia is worse. He was recently in the hospital in late Jan. For Alt. Mental status changes. She has elected not to go with hospice services at the moment. She and the family need more time to think about this option and will contact Hospice when they feel they are ready to explore this option. Otherwise she feels that patient is doing well. Dr. B follows the patient for abnormal plt counts. She wants the labs checked and feels like the NP can do the same thing as Dr. B.  ? ? ?I r/s the patient to see Sonia Baller and adjusted the time on the schedule from 145 to 245pm. Labs at 230pm. Wife thanked me for updating the schedule. ?

## 2021-07-02 NOTE — Telephone Encounter (Signed)
I called and spoke with the nurse at [facility] and I informed them that the provider stated he would continue to se the patient as long as the Hospice physician would manage the pain medications and comfort measures and she stated that that would not be a problem  and that was fine.  Anysa Tacey,cma  ?

## 2021-07-05 ENCOUNTER — Inpatient Hospital Stay (HOSPITAL_BASED_OUTPATIENT_CLINIC_OR_DEPARTMENT_OTHER): Payer: HMO | Admitting: Oncology

## 2021-07-05 ENCOUNTER — Other Ambulatory Visit: Payer: Self-pay

## 2021-07-05 ENCOUNTER — Inpatient Hospital Stay: Payer: HMO | Attending: Internal Medicine

## 2021-07-05 ENCOUNTER — Encounter: Payer: Self-pay | Admitting: Oncology

## 2021-07-05 VITALS — BP 119/59 | HR 66 | Temp 96.5°F | Resp 16 | Ht 66.0 in | Wt 217.0 lb

## 2021-07-05 DIAGNOSIS — Z79899 Other long term (current) drug therapy: Secondary | ICD-10-CM | POA: Insufficient documentation

## 2021-07-05 DIAGNOSIS — D696 Thrombocytopenia, unspecified: Secondary | ICD-10-CM | POA: Insufficient documentation

## 2021-07-05 DIAGNOSIS — W19XXXA Unspecified fall, initial encounter: Secondary | ICD-10-CM

## 2021-07-05 DIAGNOSIS — D751 Secondary polycythemia: Secondary | ICD-10-CM

## 2021-07-05 LAB — CBC WITH DIFFERENTIAL/PLATELET
Abs Immature Granulocytes: 0.05 10*3/uL (ref 0.00–0.07)
Basophils Absolute: 0.1 10*3/uL (ref 0.0–0.1)
Basophils Relative: 1 %
Eosinophils Absolute: 0.1 10*3/uL (ref 0.0–0.5)
Eosinophils Relative: 1 %
HCT: 50.4 % (ref 39.0–52.0)
Hemoglobin: 16.6 g/dL (ref 13.0–17.0)
Immature Granulocytes: 1 %
Lymphocytes Relative: 22 %
Lymphs Abs: 2.4 10*3/uL (ref 0.7–4.0)
MCH: 30.1 pg (ref 26.0–34.0)
MCHC: 32.9 g/dL (ref 30.0–36.0)
MCV: 91.3 fL (ref 80.0–100.0)
Monocytes Absolute: 0.8 10*3/uL (ref 0.1–1.0)
Monocytes Relative: 8 %
Neutro Abs: 7.3 10*3/uL (ref 1.7–7.7)
Neutrophils Relative %: 67 %
Platelets: 143 10*3/uL — ABNORMAL LOW (ref 150–400)
RBC: 5.52 MIL/uL (ref 4.22–5.81)
RDW: 14.8 % (ref 11.5–15.5)
WBC: 10.7 10*3/uL — ABNORMAL HIGH (ref 4.0–10.5)
nRBC: 0 % (ref 0.0–0.2)

## 2021-07-05 LAB — COMPREHENSIVE METABOLIC PANEL
ALT: 13 U/L (ref 0–44)
AST: 26 U/L (ref 15–41)
Albumin: 3.8 g/dL (ref 3.5–5.0)
Alkaline Phosphatase: 82 U/L (ref 38–126)
Anion gap: 11 (ref 5–15)
BUN: 13 mg/dL (ref 8–23)
CO2: 27 mmol/L (ref 22–32)
Calcium: 9 mg/dL (ref 8.9–10.3)
Chloride: 100 mmol/L (ref 98–111)
Creatinine, Ser: 1.01 mg/dL (ref 0.61–1.24)
GFR, Estimated: >60 mL/min (ref >60)
Glucose, Bld: 108 mg/dL — ABNORMAL HIGH (ref 70–99)
Potassium: 3.7 mmol/L (ref 3.5–5.1)
Sodium: 138 mmol/L (ref 135–145)
Total Bilirubin: 0.9 mg/dL (ref 0.3–1.2)
Total Protein: 6.9 g/dL (ref 6.5–8.1)

## 2021-07-05 NOTE — Progress Notes (Signed)
[facility] ?OFFICE PROGRESS NOTE ? ?Patient Care Team: ?[name], MD as PCP - General (Family Medicine) ?Wellington Hampshire, MD as PCP - Cardiology (Cardiology) ? ? ?SUMMARY OF ONCOLOGIC HISTORY: ?# Mild Thrombocytopenia- 120-130s. ? Alcohol/fatty liver/? Emmaline Kluver 2016];2016- CT/US- NED ? 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 ?# PE [Feb 2011 s/p chole; [facility] in [place] currently off Coumadin; April 2017- Afib- Start eliquis [Dr.Arida]; Parkinsons [2009; Dr.Willis. [facility]]; chronic CHF; Pulmonary fiborsis- on TKI. ? ?INTERVAL HISTORY:  ?Mr. Hackman is an 86 year old male who presents for follow-up for secondary erythrocytosis and intermittent thrombocytopenia.  He is with his wife today.  Has history of Parkinson's and dementia. ? ?Reports a fall this morning.  [name] states that he walked out of the front door while she was on the phone and fell twice in the yard scraping his knee and right hand.  Denies injury aside from superficial skin tears. ? ?He was hospitalized in January for 2 weeks for worsening dementia.  He was kept to rule out infection which was ultimately negative. ? ?He has been stable on Eliquis for atrial fibrillation.  Has instability from Parkinson's. ? ?Review of Systems  ?Constitutional: Negative.  Negative for chills, fever, malaise/fatigue and weight loss.  ?HENT:  Negative for congestion, ear pain and tinnitus.   ?Eyes: Negative.  Negative for blurred vision and double vision.  ?Respiratory: Negative.  Negative for cough, sputum production and shortness of breath.   ?Cardiovascular: Negative.  Negative for chest pain, palpitations and leg swelling.  ?Gastrointestinal: Negative.  Negative for abdominal pain, constipation, diarrhea, nausea and vomiting.  ?Genitourinary:  Negative for dysuria, frequency and urgency.  ?Musculoskeletal:   Positive for falls. Negative for back pain.  ?Skin: Negative.  Negative for rash.  ?Neurological:  Positive for weakness. Negative for headaches.  ?Endo/Heme/Allergies: Negative.  Does not bruise/bleed easily.  ?Psychiatric/Behavioral:  Positive for memory loss. Negative for depression. The patient is not nervous/anxious and does not have insomnia.   ? ? ?PAST MEDICAL HISTORY :  ?Past Medical History:  ?Diagnosis Date  ? Atherosclerosis of abdominal aorta ([place])   ? Basal cell carcinoma 03/04/2008  ? Right nose supratip.   ? CAD (coronary artery disease)   ? CABG 1998  ? Cervical spondylosis 10/01/2013  ? Chronic diastolic CHF (congestive heart failure) ([place])   ? a. 07/2016 Echo: >55%; b. 10/2016 Echo: EF 55-60%, Gr1 DD, Ao sclerosis w/o stenosis, sev dil LA; c. 08/2017 Echo: EF 60-65%, no rwma, Gr2 DD, mild AS, sev dil LA/RA.  ? Coronary artery disease   ? a. 1998 s/p mini-cabg @ [facility] - LIMA->LAD;  b. 07/2016 St Echo: Inadequate HR w/ HTN response;  c.  08/2016 MV: EF 67%, no ischemia; d. 10/2016 NSTEMI/Cath: RCA 95p (4.0x26 Onyx DES), LIMA->LAD nl; e. 09/2017 Cath: LM 40/30, LAD 100ost, RI 80, LCX nl, OM2/3 nl, RCA patent stent, 29m LIMA->LAD nl-->Med Rx.  ? DDD (degenerative disc disease), cervical   ? DDD (degenerative disc disease), lumbar   ? Dementia with parkinsonism ([place] 06/03/2020  ? Depression   ? Gait abnormality 07/31/2019  ? GERD (gastroesophageal reflux disease)   ? History of SCC (squamous cell carcinoma) of skin 07/27/2020  ? right forearm / EDC  ? Hyperlipidemia   ? Hypertension   ? Hypothyroidism   ? PAF (paroxysmal atrial fibrillation) ([facility]   ? a. s/p DCCV-->maintaining sinus on amiodarone;  b. CHA2DS2VASc = 5-->eliquis.  ? Parkinson's disease ([place])   ? tremors  ? Pleural effusion, right   ? a. 09/2017 s/p thoracentesis.  ? PNA (pneumonia) 08/26/2017  ? Pulmonary embolism ([place]) 2011  ? Pulmonary fibrosis ([place])   ? 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 Sleep apnea   ? wears CPAP  ? Squamous cell  carcinoma of skin 03/19/2015  ? Right lateral crown. KA-like pattern  ? Thrombocytopenia ([place])   ? ? ?PAST SURGICAL HISTORY :   ?Past Surgical History:  ?Procedure Laterality Date  ? [place]  ? CARDIAC CATHETERIZATION    ? CATARACT EXTRACTION Right   ? CATARACT EXTRACTION Bilateral   ? CHOLECYSTECTOMY  2010  ? COLONOSCOPY WITH PROPOFOL N/A 06/07/2018  ? Procedure: COLONOSCOPY WITH PROPOFOL;  Surgeon: Lollie Sails, MD;  Location[facility] ENDOSCOPY;  Service: Endoscopy;  Laterality: N/A;  ? CORONARY ARTERY BYPASS GRAFT  01/07/1997  ? CORONARY STENT INTERVENTION N/A 10/31/2016  ? Procedure: Coronary Stent Intervention;  Surgeon: Wellington Hampshire, MD;  Location: [facility] CV LAB;  Service: Cardiovascular;  Laterality: N/A;  ? ELECTROPHYSIOLOGIC STUDY N/A 07/14/2015  ? Procedure: CARDIOVERSION;  Surgeon: Yolonda Kida, MD;  Location: [facility] ORS;  Service: Cardiovascular;  Laterality: N/A;  ? ELECTROPHYSIOLOGIC STUDY N/A 10/12/2015  ? Procedure: CARDIOVERSION;  Surgeon: Minna Merritts, MD;  Location: [facility] ORS;  Service: Cardiovascular;  Laterality: N/A;  ? LEFT HEART CATH AND CORONARY ANGIOGRAPHY N/A 10/31/2016  ? Procedure: Left Heart Cath and Coronary Angiography;  Surgeon: Wellington Hampshire, MD;  Location: [facility] LAB;  Service: Cardiovascular;  Laterality: N/A;  ? OTHER SURGICAL HISTORY  1998  ? Bypass  ? RIGHT/LEFT HEART CATH AND CORONARY ANGIOGRAPHY N/A 09/18/2017  ? Procedure: RIGHT/LEFT HEART CATH AND CORONARY ANGIOGRAPHY;  Surgeon: Wellington Hampshire, MD;  Location: [facility] LAB;  Service: Cardiovascular;  Laterality: N/A;  ? ? ?FAMILY HISTORY :   ?Family History  ?Problem Relation Age of Onset  ? Alcohol abuse Father   ? Parkinson's disease Neg Hx   ? ? ?SOCIAL HISTORY:   ?Social History  ? ?Tobacco Use  ? Smoking status: Former  ?  Packs/day: 1.00  ?  Years: 10.00  ?  Pack years: 10.00  ?  Types: Cigarettes, Pipe, Cigars  ?  Quit date: 04/18/1972  ?  Years since quitting:  49.2  ? Smokeless tobacco: Former  ?  Types: Chew  ?  Quit date: 04/18/1972  ?Vaping Use  ? Vaping Use: Never used  ?Substance Use Topics  ? Alcohol use: Yes  ?  Alcohol/week: 4.0 standard drinks  ?  Types: 2 Cans of beer, 2 Shots of liquor per week  ?  Comment: per 2 weeks   ? Drug use: No  ? ? ?ALLERGIES:  is allergic to pravastatin and prednisone. ? ?MEDICATIONS:  ?Current Outpatient Medications  ?Medication Sig Dispense Refill  ? albuterol (VENTOLIN HFA) 108 (90 Base) MCG/ACT inhaler Inhale 2 puffs into the lungs every 6 (six) hours as needed for wheezing or shortness of breath. 8 g 6  ? buPROPion (WELLBUTRIN XL) 300 MG 24 hr tablet TAKE ONE TABLET BY MOUTH EVERY DAY 90 tablet 0  ? carbidopa-levodopa (SINEMET IR) 25-250 MG tablet Take 0.5 tablets by mouth 4 (four) times daily. 180 tablet 3  ? ELIQUIS 5 MG TABS tablet TAKE ONE TABLET BY MOUTH TWICE DAILY 180 tablet 1  ? entacapone (COMTAN) 200 MG tablet TAKE 1 TABLET BY MOUTH  3 TIMES DAILY (Patient taking differently: Take 200 mg by mouth 3 (three) times daily.) 270 tablet 3  ? esomeprazole (NEXIUM) 40 MG capsule TAKE 1 CAPSULE BY MOUTH ONCE DAILY (Patient taking differently: Take 40 mg by mouth daily.) 30 capsule 2  ? feeding supplement (ENSURE ENLIVE / ENSURE PLUS) LIQD Take 237 mLs by mouth 3 (three) times daily between meals. 237 mL 12  ? fluticasone (FLONASE) 50 MCG/ACT nasal spray USE 2 PUFFS IN EACH NOSTRIL DAILY (Patient taking differently: Place 2 sprays into both nostrils daily.) 16 g 3  ? furosemide (LASIX) 40 MG tablet TAKE ONE TABLET BY MOUTH DAILY 30 tablet 0  ? hydrocortisone 2.5 % lotion For seborrheic dermatitis of the face apply to aa's QAM on Tuesday, Thursday, and Saturday. 59 mL 0  ? ketoconazole (NIZORAL) 2 % cream Apply a thin coat to face QAM on Monday, Wednesday, and Friday. 60 g 3  ? ketoconazole (NIZORAL) 2 % shampoo Shampoo into the face and scalp let sit 5 minutes then wash off. Use 3d/wk. 120 mL 3  ? lactulose (CHRONULAC) 10 GM/15ML  solution TAKE 30 MLS BY MOUTH TWICE DAILY AS NEEDED FOR MODERATE CONSTIPATION 236 mL 0  ? levothyroxine (SYNTHROID) 50 MCG tablet TAKE ONE TABLET ON AN EMPTY STOMACH WITHA GLASS OF WATER AT LEAST 30 TO 60 MINUTES BEFO

## 2021-07-07 ENCOUNTER — Other Ambulatory Visit: Payer: Self-pay

## 2021-07-07 ENCOUNTER — Encounter: Payer: Self-pay | Admitting: Family Medicine

## 2021-07-07 ENCOUNTER — Ambulatory Visit (INDEPENDENT_AMBULATORY_CARE_PROVIDER_SITE_OTHER): Payer: HMO | Admitting: Family Medicine

## 2021-07-07 ENCOUNTER — Telehealth: Payer: Self-pay

## 2021-07-07 VITALS — BP 130/70 | HR 60 | Temp 97.6°F | Ht 66.0 in | Wt 215.4 lb

## 2021-07-07 DIAGNOSIS — G20 Parkinson's disease: Secondary | ICD-10-CM

## 2021-07-07 DIAGNOSIS — F0280 Dementia in other diseases classified elsewhere without behavioral disturbance: Secondary | ICD-10-CM | POA: Diagnosis not present

## 2021-07-07 DIAGNOSIS — T148XXA Other injury of unspecified body region, initial encounter: Secondary | ICD-10-CM

## 2021-07-07 DIAGNOSIS — M791 Myalgia, unspecified site: Secondary | ICD-10-CM

## 2021-07-07 NOTE — Assessment & Plan Note (Signed)
Seems to have had some progression over time.  Has occasional hallucinations.  His dementia is likely related to his Parkinson's.  Seroquel seems to have been beneficial for reduction and adverse behavior.  He will continue the Seroquel at this time.  He will see neurology as planned. ?

## 2021-07-07 NOTE — Chronic Care Management (AMB) (Signed)
?  Chronic Care Management  ? ?Note ? ?07/07/2021 ?Name: JARMARCUS WAMBOLD MRN: [id number] DOB: April 30, 1935 ? ?JIOVANY SCHEFFEL is a 86 y.o. year old male who is a primary care patient of [name], Angela Adam, MD. I reached out to [name] by phone today in response to a referral sent by Mr. Kavontae Pritchard [facility] PCP. ? ?Mr. Ragsdale was given information about Chronic Care Management services today including:  ?CCM service includes personalized support from designated clinical staff supervised by his physician, including individualized plan of care and coordination with other care providers ?24/7 contact phone numbers for assistance for urgent and routine care needs. ?Service will only be billed when office clinical staff spend 20 minutes or more in a month to coordinate care. ?Only one practitioner may furnish and bill the service in a calendar month. ?The patient may stop CCM services at any time (effective at the end of the month) by phone call to the office staff. ?The patient is responsible for co-pay (up to 20% after annual deductible is met) if co-pay is required by the individual health plan.  ? ?Patient agreed to services and verbal consent obtained.  ? ?Follow up plan: ?Telephone appointment with care management team member scheduled for:07/21/2021 ? ?Noreene Larsson, RMA ?Care Guide, Embedded Care Coordination ?[facility]  [facility]  ?[place], [place] [address] ?Direct Dial: [phone] ?[email or web address] ?Website: [email or web address]  ? ?

## 2021-07-07 NOTE — Patient Instructions (Addendum)
Nice to see you. ?Psychiatry should contact you to set up an appointment.  ?The social worker will call you as well.  ?If you notice any signs of infection around the abrasions on his leg please let us know right away.  ?

## 2021-07-07 NOTE — Progress Notes (Signed)
?Andre Rumps, MD ?Phone: [phone] ? ?Andre [name] is a 86 y.o. male who presents today for f/u. ? ?Parkinson's disease/dementia: Patient feels as though his dementia may be a little better.  Occasionally has times where it is worse.  He has not had any additional threatening behavior.  His wife notes she is not afraid of him doing anything to her.  He has remained on Seroquel.  They have an appointment with neurology though it appears neurology also recommended that he see geriatric psychiatry.  He does report occasionally seeing shadows and then he will look and there will not be anything there.  He does not have any urge to hurt anybody.  His wife is afraid to leave him alone and is concerned that he may wander off.  She notes she is also afraid that he may fall if she were to leave him alone. ? ?Leg pain: Patient notes thigh discomfort typically in the morning though does happen later in the day.  Bothers him when he stands up.  He has been trying to stand more and be a little more active.  He notes no injury.  Has not improved.  This has been going on a week or so.  He has not tried any medications for this.  He is on a statin. ? ?Fall: They report he had a fall recently where he fell into some brush.  He had no head injury.  He had a bruise on his left arm and scraped up his right leg. ? ?Social History  ? ?Tobacco Use  ?Smoking Status Former  ? Packs/day: 1.00  ? Years: 10.00  ? Pack years: 10.00  ? Types: Cigarettes, Pipe, Cigars  ? Quit date: 04/18/1972  ? Years since quitting: 49.2  ?Smokeless Tobacco Former  ? Types: Chew  ? Quit date: 04/18/1972  ? ? ?Current Outpatient Medications on File Prior to Visit  ?Medication Sig Dispense Refill  ? albuterol (VENTOLIN HFA) 108 (90 Base) MCG/ACT inhaler Inhale 2 puffs into the lungs every 6 (six) hours as needed for wheezing or shortness of breath. 8 g 6  ? buPROPion (WELLBUTRIN XL) 300 MG 24 hr tablet TAKE ONE TABLET BY MOUTH EVERY DAY 90 tablet 0  ?  carbidopa-levodopa (SINEMET IR) 25-250 MG tablet Take 0.5 tablets by mouth 4 (four) times daily. 180 tablet 3  ? colchicine 0.6 MG tablet Day 1: 1.2 mg (two pills) at the first sign of flare, followed by 0.6 mg (1 pill) after 1 hour, day 2 and after: 0.6 mg once daily until flare resolves 30 tablet 0  ? ELIQUIS 5 MG TABS tablet TAKE ONE TABLET BY MOUTH TWICE DAILY 180 tablet 1  ? entacapone (COMTAN) 200 MG tablet TAKE 1 TABLET BY MOUTH 3 TIMES DAILY (Patient taking differently: Take 200 mg by mouth 3 (three) times daily.) 270 tablet 3  ? esomeprazole (NEXIUM) 40 MG capsule TAKE 1 CAPSULE BY MOUTH ONCE DAILY (Patient taking differently: Take 40 mg by mouth daily.) 30 capsule 2  ? feeding supplement (ENSURE ENLIVE / ENSURE PLUS) LIQD Take 237 mLs by mouth 3 (three) times daily between meals. 237 mL 12  ? fluticasone (FLONASE) 50 MCG/ACT nasal spray USE 2 PUFFS IN EACH NOSTRIL DAILY (Patient taking differently: Place 2 sprays into both nostrils daily.) 16 g 3  ? furosemide (LASIX) 40 MG tablet TAKE ONE TABLET BY MOUTH DAILY 30 tablet 0  ? hydrocortisone 2.5 % lotion For seborrheic dermatitis of the face apply to aa's  QAM on Tuesday, Thursday, and Saturday. 59 mL 0  ? ketoconazole (NIZORAL) 2 % cream Apply a thin coat to face QAM on Monday, Wednesday, and Friday. 60 g 3  ? ketoconazole (NIZORAL) 2 % shampoo Shampoo into the face and scalp let sit 5 minutes then wash off. Use 3d/wk. 120 mL 3  ? lactulose (CHRONULAC) 10 GM/15ML solution TAKE 30 MLS BY MOUTH TWICE DAILY AS NEEDED FOR MODERATE CONSTIPATION 236 mL 0  ? levothyroxine (SYNTHROID) 50 MCG tablet TAKE ONE TABLET ON AN EMPTY STOMACH WITHA GLASS OF WATER AT LEAST 30 TO 60 MINUTES BEFORE BREAKFAST (Patient taking differently: Take 50 mcg by mouth daily before breakfast.) 90 tablet 1  ? metoprolol tartrate (LOPRESSOR) 25 MG tablet Take 1 tablet (25 mg total) by mouth 2 (two) times daily. 180 tablet 3  ? QUEtiapine (SEROQUEL) 25 MG tablet Take 3 tablets (75 mg total)  by mouth at bedtime. 90 tablet 2  ? rosuvastatin (CRESTOR) 10 MG tablet TAKE 1 TABLET BY MOUTH DAILY 90 tablet 0  ? sertraline (ZOLOFT) 50 MG tablet TAKE 1 TABLET BY MOUTH DAILY 30 tablet 3  ? silodosin (RAPAFLO) 8 MG CAPS capsule TAKE 1 CAPSULE BY MOUTH ONCE DAILY WITH BREAKFAST 30 capsule 3  ? STIOLTO RESPIMAT 2.5-2.5 MCG/ACT AERS SMARTSIG:2 Puff(s) Via Inhaler Daily    ? ?No current facility-administered medications on file prior to visit.  ? ? ? ?ROS see history of present illness ? ?Objective ? ?Physical Exam ?Vitals:  ? 07/07/21 1115  ?BP: 130/70  ?Pulse: 60  ?Temp: 97.6 ?F (36.4 ?C)  ?SpO2: 94%  ? ? ?BP Readings from Last 3 Encounters:  ?07/07/21 130/70  ?07/05/21 (!) 119/59  ?05/26/21 130/70  ? ?Wt Readings from Last 3 Encounters:  ?07/07/21 215 lb 6.4 oz (97.7 kg)  ?07/05/21 217 lb (98.4 kg)  ?05/26/21 218 lb (98.9 kg)  ? ? ?Physical Exam ?Constitutional:   ?   General: He is not in acute distress. ?   Appearance: He is not diaphoretic.  ?Cardiovascular:  ?   Rate and Rhythm: Normal rate and regular rhythm.  ?   Heart sounds: Normal heart sounds.  ?Pulmonary:  ?   Effort: Pulmonary effort is normal.  ?   Breath sounds: Normal breath sounds.  ?Skin: ?   General: Skin is warm and dry.  ?   Comments: Abrasions noted over his right knee and right proximal shin, no surrounding signs of infection, no tenderness, single bruise over his left dorsal forearm with no tenderness  ?Neurological:  ?   Mental Status: He is alert.  ? ? ? ?Assessment/Plan: Please see individual problem list. ? ?Problem List Items Addressed This Visit   ? ? Dementia associated with Parkinson's disease ([place]) (Chronic)  ?  Refer to psychiatry to get their input and help in managing his Seroquel or other medications that may be necessary to treat this issue.  I did place a referral to social work to see about resources for help at home.  I did discuss that he may benefit from being in a memory unit. ?  ?  ? Relevant Orders  ? Ambulatory  referral to Psychiatry  ? AMB Referral to [facility]  ? Parkinson's disease ([place]) (Chronic)  ?  Seems to have had some progression over time.  Has occasional hallucinations.  His dementia is likely related to his Parkinson's.  Seroquel seems to have been beneficial for reduction and adverse behavior.  He will continue the Seroquel  at this time.  He will see neurology as planned. ?  ?  ? Abrasion  ?  Related to recent fall.  No signs of infection.  They will cleanse with soap and water.  They can apply topical antibiotic ointment.  If there are signs of infection they will contact us right away. ?  ?  ? Myalgia - Primary  ?  This has been going on for a week or so and is isolated to his thighs.  This could be related to him using his legs a little bit more recently though his statin could be playing a role.  We will have him discontinue his statin for a couple of weeks.  His wife will update me on how this works out for him.  If the pain does not improve he can always go back on the statin. ?  ?  ? ? ?Return in about 3 months (around 10/07/2021). ? ?This visit occurred during the SARS-CoV-2 public health emergency.  Safety protocols were in place, including screening questions prior to the visit, additional usage of staff PPE, and extensive cleaning of exam room while observing appropriate contact time as indicated for disinfecting solutions.  ? ? ?Andre Rumps, MD ?Index ? ?

## 2021-07-07 NOTE — Assessment & Plan Note (Addendum)
Refer to psychiatry to get their input and help in managing his Seroquel or other medications that may be necessary to treat this issue.  I did place a referral to social work to see about resources for help at home.  I did discuss that he may benefit from being in a memory unit. ?

## 2021-07-07 NOTE — Assessment & Plan Note (Signed)
This has been going on for a week or so and is isolated to his thighs.  This could be related to him using his legs a little bit more recently though his statin could be playing a role.  We will have him discontinue his statin for a couple of weeks.  His wife will update me on how this works out for him.  If the pain does not improve he can always go back on the statin. ?

## 2021-07-07 NOTE — Assessment & Plan Note (Signed)
Related to recent fall.  No signs of infection.  They will cleanse with soap and water.  They can apply topical antibiotic ointment.  If there are signs of infection they will contact us right away. ?

## 2021-07-08 ENCOUNTER — Ambulatory Visit: Payer: HMO | Admitting: Neurology

## 2021-07-15 ENCOUNTER — Other Ambulatory Visit: Payer: Self-pay | Admitting: Family Medicine

## 2021-07-20 ENCOUNTER — Other Ambulatory Visit: Payer: Self-pay | Admitting: Family Medicine

## 2021-07-21 ENCOUNTER — Telehealth: Payer: HMO

## 2021-07-22 ENCOUNTER — Telehealth: Payer: Self-pay | Admitting: Family Medicine

## 2021-07-22 ENCOUNTER — Telehealth: Payer: Self-pay | Admitting: Neurology

## 2021-07-22 ENCOUNTER — Other Ambulatory Visit: Payer: Self-pay

## 2021-07-22 NOTE — Telephone Encounter (Signed)
Spoke with Wife(checked DPR) wanted to know if  she can have a sooner appointment for patient .  Offered  appointment with Judson Roch NP on  4-13 745am . Wife states she will keep her appointment  on  08-02-2021 with Frann Rider . I did inform wife if patient confusion increases she can call and make a appointment  with PCP to see if  patient has a UTI or any other infections or she can call 911 to take him to ED. Wife  expressed understanding and thanked me for calling her back  ? ?
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
## 2021-07-22 NOTE — Telephone Encounter (Signed)
Pt spouse called regarding referral for dementia. Pt spouse said they have not heard anything back ?

## 2021-07-22 NOTE — Patient Outreach (Signed)
[facility]) Care Management ? ?07/22/2021 ? ?[name] ?November 07, 1935 ?[id number] ? ?Patients spouse called overwhelmed stating patient below has dementia. Wife would like outreach as she has shared with PCP how overwhelming this is for her and she needs help with resources. ? ?Referral placed to [facility] ([email or web address]) via email for Care Coordination. ? ?Ina Homes ?[facility]-Care Management Assistant ?[phone]  ?

## 2021-07-22 NOTE — Telephone Encounter (Addendum)
LMVM for wife that returned call.  Has appt with JM/NP 08-02-2021, may move up with another NP?  Sarah NP had [phone] when I looked.  ?

## 2021-07-27 ENCOUNTER — Telehealth: Payer: Self-pay | Admitting: Family Medicine

## 2021-07-27 ENCOUNTER — Other Ambulatory Visit: Payer: Self-pay

## 2021-07-27 DIAGNOSIS — E785 Hyperlipidemia, unspecified: Secondary | ICD-10-CM

## 2021-07-27 NOTE — Telephone Encounter (Signed)
Pt spouse called stating that pt is not hurting anymore in his upper leg since he went off of cholesterol medication. Pt wife wants another cholesterol medication to be called in. ? ?

## 2021-07-28 MED ORDER — ATORVASTATIN CALCIUM 10 MG PO TABS: 10.0000 mg | ORAL_TABLET | Freq: Every day | ORAL | 3 refills | Status: DC

## 2021-07-28 NOTE — Telephone Encounter (Signed)
Lipitor sent to the pharmacy. He needs labs in 6 weeks. Orders placed. If his leg starts bothering him again they should let us know.  ?

## 2021-07-29 NOTE — Telephone Encounter (Signed)
I called and spoke with the patients wife and the patient is scheduled for a lab recheck in 6 weeks and I informed her that a new medication was sent to his pharmacy.  Paschal Blanton,cma  ?

## 2021-08-02 ENCOUNTER — Ambulatory Visit: Payer: HMO | Admitting: Dermatology

## 2021-08-02 ENCOUNTER — Encounter: Payer: Self-pay | Admitting: Internal Medicine

## 2021-08-02 ENCOUNTER — Ambulatory Visit: Payer: HMO | Admitting: Adult Health

## 2021-08-02 ENCOUNTER — Telehealth: Payer: Self-pay | Admitting: Adult Health

## 2021-08-02 ENCOUNTER — Other Ambulatory Visit (HOSPITAL_COMMUNITY): Payer: Self-pay

## 2021-08-02 ENCOUNTER — Encounter: Payer: Self-pay | Admitting: Adult Health

## 2021-08-02 VITALS — BP 131/74 | HR 58 | Ht 66.0 in | Wt 212.0 lb

## 2021-08-02 DIAGNOSIS — R443 Hallucinations, unspecified: Secondary | ICD-10-CM | POA: Diagnosis not present

## 2021-08-02 DIAGNOSIS — F0280 Dementia in other diseases classified elsewhere without behavioral disturbance: Secondary | ICD-10-CM | POA: Diagnosis not present

## 2021-08-02 DIAGNOSIS — G20 Parkinson's disease: Secondary | ICD-10-CM | POA: Diagnosis not present

## 2021-08-02 MED ORDER — NUPLAZID 34 MG PO CAPS: 34.0000 mg | ORAL_CAPSULE | Freq: Every day | ORAL | 5 refills | Status: DC

## 2021-08-02 NOTE — Patient Instructions (Addendum)
Your Plan: ? ?Continue Sinemet and Comtan ? ?Start Nuplazid for hallucinations  ? ?Please ensure you establish care with psychiatry for further behavioral management ? ?Agree with working with physical therapy to help with balance and strength ? ?If any safety concerns to himself or family, please do not hesitate to call 911 immediately for further assistance - may need to consider looking in to a memory care unit if symptoms worsen  ? ? ? ? ?Follow up in 4 months with Dr. Rexene Alberts or call earlier if needed ? ? ? ? ?Thank you for coming to see [place] at [facility]. I hope we have been able to provide you high quality care today. ? ?You may receive a patient satisfaction survey over the next few weeks. We would appreciate your feedback and comments so that we may continue to improve ourselves and the health of our patients. ? ? ? ? ?Pimavanserin Capsules or Tablets ?What is this medication? ?PIMAVANSERIN (pi ma VAN ser in) treats hallucinations and delusions in people with Parkinson disease. It works by balancing substances in your brain that help regulate mood, behaviors, and thoughts. It does not help with body movements or coordination. It belongs to a group of medications called antipsychotics. ?This medicine may be used for other purposes; ask your health care provider or pharmacist if you have questions. ?COMMON BRAND NAME(S): NUPLAZID ?What should I tell my care team before I take this medication? ?They need to know if you have any of these conditions: ?Dementia ?Heart disease ?History of irregular heartbeat ?Kidney disease ?Low levels of magnesium or potassium in the blood ?An unusual or allergic reaction to pimavanserin, other medications, foods, dyes, or preservatives ?Pregnant or trying to get pregnant ?Breast-feeding ?How should I use this medication? ?Take this medication by mouth with a glass of water. Follow the directions on the prescription label. Swallow the capsules whole. You can take  it with or without food. You may open the capsule and put the contents in 1 tablespoon of applesauce, yogurt, pudding, or a liquid nutritional supplement. Swallow the medication and food mixture right away. Do not chew the medication or food. Take your medication at regular intervals. Do not take it more often than directed. Do not stop taking except on your care team's advice. ?Talk to your care team about the use of this medication in children. This medication is not approved for use in children. ?Overdosage: If you think you have taken too much of this medicine contact a poison control center or emergency room at once. ?NOTE: This medicine is only for you. Do not share this medicine with others. ?What if I miss a dose? ?If you miss a dose, take it as soon as you can. If it is almost time for your next dose, take only that dose. Do not take double or extra doses. ?What may interact with this medication? ?Do not take this medication with any of the following: ?Certain medications for fungal infections like fluconazole, itraconazole, ketoconazole, posaconazole, voriconazole ?Cisapride ?Dronedarone ?Pimozide ?Thioridazine ?This medication may also interact with the following: ?Carbamazepine ?Certain antivirals for HIV or hepatitis like ritonavir ?Certain antibiotics like clarithromycin, gatifloxacin, moxifloxacin ?Certain medications for irregular heart beat like amiodarone, disopyramide, propafenone, quinidine, sotalol ?Fosphenytoin ?Modafinil ?Nafcillin ?Phenobarbital ?Primidone ?Phenytoin ?Other medications for psychotic disturbances ?Other medications that prolong the QT interval (cause an abnormal heart rhythm) like dofetilide ?Rifampin ?Mayetta ?This list may not describe all possible interactions. Give your health care provider a list of  all the medicines, herbs, non-prescription drugs, or dietary supplements you use. Also tell them if you smoke, drink alcohol, or use illegal drugs. Some items may  interact with your medicine. ?What should I watch for while using this medication? ?Visit your care team for regular checks on your progress. Tell your care team if your symptoms do not start to get better or if they get worse. ?What side effects may I notice from receiving this medication? ?Side effects that you should report to your doctor or health care professional as soon as possible: ?Allergic reactions--skin rash, itching, hives, swelling of the face, lips, tongue, or throat ?Heart rhythm changes--fast or irregular heartbeat, dizziness, feeling faint or lightheaded, chest pain, trouble breathing ?Side effects that usually do not require medical attention (report to your doctor or health care professional if they continue or are bothersome): ?Confusion ?Constipation ?Drowsiness ?Fatigue ?Nausea ?Swelling of the ankles, hands, or feet ?This list may not describe all possible side effects. Call your doctor for medical advice about side effects. You may report side effects to FDA at 1-800-FDA-1088. ?Where should I keep my medication? ?Keep out of the reach of children and pets. ?Store at room temperature between 20 and 25 degrees C (68 and 77 degrees F). Protect from light. Get rid of any unused medication after the expiration date. ?To get rid of medications that are no longer needed or have expired: ?Take the medication to a medication take-back program. Check with your pharmacy or law enforcement to find a location. ?If you cannot return the medication, check the label or package insert to see if the medication should be thrown out in the garbage or flushed down the toilet. If you are not sure, ask your care team. If it is safe to put it in the trash, empty the medication out of the container. Mix the medication with cat litter, dirt, coffee grounds, or other unwanted substance. Seal the mixture in a bag or container. Put it in the trash. ?NOTE: This sheet is a summary. It may not cover all possible  information. If you have questions about this medicine, talk to your doctor, pharmacist, or health care provider. ?? 2023 Elsevier/Gold Standard (2021-03-05 00:00:00) ? ? ? ? ? ? ?

## 2021-08-02 NOTE — Telephone Encounter (Signed)
This med requires a start form. Will complete provider side and then update pt's wife.  ?

## 2021-08-02 NOTE — Telephone Encounter (Signed)
Pt wife regarding Pimavanserin Tartrate (NUPLAZID) 34 MG CAPS at  [facility] that was called in today after appt 08/02/2021.  ? [place] has prescription which was called in, they do not have it in stock and telling pt they have to call speciality pharmacy to get this medication. Pt wife states there is no specialty pharmacy's in the area.  ?Pt wife requesting a call back regarding medication.  ?[phone] ?
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
## 2021-08-03 ENCOUNTER — Other Ambulatory Visit (HOSPITAL_COMMUNITY): Payer: Self-pay

## 2021-08-03 MED ORDER — QUETIAPINE FUMARATE 50 MG PO TABS: 50.0000 mg | ORAL_TABLET | Freq: Every day | ORAL | 5 refills | Status: DC

## 2021-08-03 MED ORDER — QUETIAPINE FUMARATE 25 MG PO TABS: 25.0000 mg | ORAL_TABLET | Freq: Every day | ORAL | 0 refills | Status: DC

## 2021-08-03 NOTE — Telephone Encounter (Signed)
Pt's wife has called back and would like to know if a "regular" medication could be called in that does not require pt to have to go to a Specialty pharmacy.  Phone rep advised wife this is something the nurse will have to consult Janett Billow, NP on.  Pt's wife states she would like a call once it has been decided what other options are available to pt. ?

## 2021-08-03 NOTE — Telephone Encounter (Signed)
I would recommend that he restart Seroquel especially as no significant change in daytime fatigue after stopping but will need to restart at lower dose as he has been off medication over the past 2 to 3 months.  Recommend restarting at 25 mg nightly for 1 week then increase to 50 mg nightly.  May need to further increase dosage based on response and tolerance, please advise wife to keep [place] updated. ?

## 2021-08-03 NOTE — Telephone Encounter (Signed)
I called the pt's wife and relayed Np's recommendation. She verbalized understanding buy would like to hold off at this time. She is due to receive a call tomorrow from the pharmacist about possible copay assistance and will let us know if the medication is affordable.  ?

## 2021-08-04 ENCOUNTER — Ambulatory Visit: Payer: HMO | Admitting: *Deleted

## 2021-08-04 ENCOUNTER — Other Ambulatory Visit (HOSPITAL_COMMUNITY): Payer: Self-pay

## 2021-08-05 ENCOUNTER — Other Ambulatory Visit (HOSPITAL_COMMUNITY): Payer: Self-pay

## 2021-08-05 ENCOUNTER — Telehealth: Payer: Self-pay

## 2021-08-05 MED ORDER — NUPLAZID 34 MG PO CAPS
ORAL_CAPSULE | ORAL | 5 refills | Status: DC
  Filled 2021-08-05: qty 30, 30d supply, fill #0

## 2021-08-05 NOTE — Chronic Care Management (AMB) (Signed)
Assessment: Assessed patient's previous and current treatment, coping skills, support system and barriers to care.. No Care Plan was developed during this encounter.  ?Recent life changes [place]: Phone call to patient's wife to discuss patient's in home needs. Per patient's spouse, patient is doing much better and he is no longer in need of in home care.  Per patient's spouse, patient continues  to sleep a lot and is a fall risk. She confirmed current follow up with a Neurologist. Patient's spouse declined having any community resource needs at this time stating that she is able to provide care.  Patient's spouse did have concerns about leaving patient home alone. She declined in home care coordination, however  was open to obtaining a med alert system for patient. Contact information provided for the med alert program through [facility] provided [phone]. ?Interventions:Active listening / Reflection utilized  ?Caregiver stress acknowledged  ?Consideration of in-home help encouraged : options discussed ? ?Recommendation: Patient's spouse agrees  to contact [facility] med alert program ?Follow up Plan: CCM LCSW will disconnect from patient's care team at this time, but will be available at any time they would like to re-engage for care coordination services.  ? ?

## 2021-08-05 NOTE — Telephone Encounter (Signed)
After several failed attempts at faxing the form, pt's wife has requested the form be e-mailed to [email or web address]  ?I have done as requested.  ?

## 2021-08-05 NOTE — Telephone Encounter (Signed)
I have re faxed forms as requested.  ?

## 2021-08-05 NOTE — Telephone Encounter (Signed)
I called pt's wife and updated on this information she is agreeable to pursuing patient assistance. I have faxed forms for pt to sign to secure fax  #[phone]. Will updated accordingly.  ?

## 2021-08-05 NOTE — Telephone Encounter (Addendum)
Nuplazid PA has been approved through 08/05/22. Patient's copay is $ 1,191.99 for 1 month supply. ? ?(Key: [id number]) - [id number] ? ?Called [employer] to confirm their patient assistance options for this medication. ? ?Patient will need to complete Nuplazid start forms (provider and patient portion) in order to be considered for their patient assistance options. ? ? ?

## 2021-08-05 NOTE — Telephone Encounter (Signed)
Pt's wife said have not received fax. Asking if nurse could refax  ?

## 2021-08-05 NOTE — Chronic Care Management (AMB) (Signed)
?  Chronic Care Management  ? ?Note ? ?08/05/2021 ?Name: Andre [name] MRN: [id number] DOB: 1935-05-04 ? ?Andre [name] is a 86 y.o. year old male who is a primary care patient of [name], Angela Adam, MD. Andre [name] is currently enrolled in care management services. An additional referral for RNCM  was placed.  ? ?Follow up plan: ?Unsuccessful telephone outreach attempt made. A HIPAA compliant phone message was left for the patient providing contact information and requesting a return call.  ?The care management team will reach out to the patient again over the next 7 days.  ?If patient returns call to provider office, please advise to call [place]  at [phone] ? ?Noreene Larsson, RMA ?Care Guide, Embedded Care Coordination ?[facility]  ?[place], [place] [address] ?Direct Dial: [phone] ?[email or web address] ?Website: [email or web address]  ? ?

## 2021-08-06 ENCOUNTER — Other Ambulatory Visit (HOSPITAL_COMMUNITY): Payer: Self-pay

## 2021-08-09 ENCOUNTER — Other Ambulatory Visit (HOSPITAL_COMMUNITY): Payer: Self-pay

## 2021-08-09 NOTE — Telephone Encounter (Signed)
Pt's wife said she cannot print off the form that e-mailed to fill out for patient assistance. Would like a call from the nurse. ?

## 2021-08-09 NOTE — Telephone Encounter (Signed)
I returned pt's wife call and we discussed. She would like start form to be faxed to PCP ( Dr. Ellen Henri o ffice) since this location is closer than ours. I have faxed to # [phone]. Pt was advised if this attempt did not work the next best thing will be to come by our office and complete the forms.  ?

## 2021-08-10 ENCOUNTER — Other Ambulatory Visit (HOSPITAL_COMMUNITY): Payer: Self-pay

## 2021-08-11 ENCOUNTER — Telehealth: Payer: Self-pay

## 2021-08-11 ENCOUNTER — Other Ambulatory Visit (HOSPITAL_COMMUNITY): Payer: Self-pay

## 2021-08-11 NOTE — Telephone Encounter (Signed)
Thank you.  Signed and placed in outbox. ?

## 2021-08-11 NOTE — Telephone Encounter (Signed)
Received the Nuplazid forms from PCP with patient's signatures.  Form on NP's desk for review, completion and signatures.  ?

## 2021-08-11 NOTE — Chronic Care Management (AMB) (Signed)
?  Care Management  ? ?Note ? ?08/11/2021 ?Name: KATHERINE SYME MRN: [id number] DOB: [birth year] ? ?Andre [name] is a 86 y.o. year old male who is a primary care patient of [name], Angela Adam, MD. I reached out to [name] by phone today offer care coordination services.  ? ?Mr. Esau was given information about care management services today including:  ?Care management services include personalized support from designated clinical staff supervised by his physician, including individualized plan of care and coordination with other care providers ?24/7 contact phone numbers for assistance for urgent and routine care needs. ?The patient may stop care management services at any time by phone call to the office staff. ? ?Patient did not agree to enrollment in care management services and does not wish to consider at this time. ? ?Follow up plan: ?Patient declines further follow up and engagement by the care management team. Appropriate care team members and provider have been notified via electronic communication.  ? ?Noreene Larsson, RMA ?Care Guide, Embedded Care Coordination ?[facility]  [facility]  ?[place], [place] [address] ?Direct Dial: [phone] ?[email or web address] ?Website: [email or web address]  ? ?

## 2021-08-11 NOTE — Telephone Encounter (Signed)
I called the patient and spoke with the wife and informed her that the medical release from [facility] came to the office and they are to come by and sign and we will fax it back to them and she understood.  Camora Tremain,cma  ?

## 2021-08-11 NOTE — Telephone Encounter (Signed)
Nuplazid form completed , signed by NP. Faxed with insurance and med/allergy list to [employer]. Received confirmation. ?

## 2021-08-12 ENCOUNTER — Other Ambulatory Visit (HOSPITAL_COMMUNITY): Payer: Self-pay

## 2021-08-12 NOTE — Telephone Encounter (Signed)
Received fax form Cruzita Lederer: patient is approved from 4/20/2 until 08/05/2022, Rx was sent to [place], P [phone].  ?

## 2021-08-12 NOTE — Telephone Encounter (Signed)
Received fax from [facility] re: received enrollment form, will get back in touch in 2 days. , questions call [phone]. ?

## 2021-08-14 ENCOUNTER — Other Ambulatory Visit (HOSPITAL_COMMUNITY): Payer: Self-pay

## 2021-08-16 NOTE — Telephone Encounter (Signed)
Andre [name] from [facility] [phone] is asking if someone can reach out to pt to make him aware that they having been trying to reach pt re: co pay assistance and if he see's [phone] on his phone its them. ?

## 2021-08-16 NOTE — Telephone Encounter (Signed)
Called wife and left detailed VM giving her Jim's message, his name, # and advised she call and/or answer the phone when the call comes through. He is trying to reach them re: co pay assistance.  ?

## 2021-08-23 ENCOUNTER — Other Ambulatory Visit: Payer: Self-pay | Admitting: Family Medicine

## 2021-08-27 ENCOUNTER — Other Ambulatory Visit: Payer: Self-pay | Admitting: Family Medicine

## 2021-08-27 ENCOUNTER — Telehealth: Payer: Self-pay | Admitting: Adult Health

## 2021-08-27 NOTE — Telephone Encounter (Signed)
[facility] Coordinator from [facility] called stating that the pt's Pimavanserin Tartrate (NUPLAZID) 34 MG CAPS has been forwarded to [place] ?

## 2021-08-27 NOTE — Telephone Encounter (Signed)
Pt called in requesting refill on medication... Pt requesting callback  ?

## 2021-08-30 ENCOUNTER — Other Ambulatory Visit: Payer: Self-pay

## 2021-08-30 DIAGNOSIS — H6123 Impacted cerumen, bilateral: Secondary | ICD-10-CM | POA: Diagnosis not present

## 2021-08-30 DIAGNOSIS — H903 Sensorineural hearing loss, bilateral: Secondary | ICD-10-CM | POA: Diagnosis not present

## 2021-08-30 NOTE — Telephone Encounter (Signed)
Noted, Nuplazid will come from [place]. ?

## 2021-08-30 NOTE — Telephone Encounter (Signed)
LM that colchine sent to [facility].  ?

## 2021-08-31 NOTE — Telephone Encounter (Signed)
Contacted pt spouse back, LVM rq call back  ? 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

## 2021-08-31 NOTE — Telephone Encounter (Signed)
Pt's wife, [name] Trying get [employer] to help pay for his medication, They are requiring 2 diagnosis. Would like a call from the nurse. ?Please call the cellphone, [phone] ?

## 2021-09-02 ENCOUNTER — Ambulatory Visit: Payer: HMO

## 2021-09-06 ENCOUNTER — Other Ambulatory Visit (INDEPENDENT_AMBULATORY_CARE_PROVIDER_SITE_OTHER): Payer: HMO

## 2021-09-06 DIAGNOSIS — E785 Hyperlipidemia, unspecified: Secondary | ICD-10-CM

## 2021-09-06 LAB — HEPATIC FUNCTION PANEL
ALT: 7 U/L (ref 0–53)
AST: 11 U/L (ref 0–37)
Albumin: 3.8 g/dL (ref 3.5–5.2)
Alkaline Phosphatase: 85 U/L (ref 39–117)
Bilirubin, Direct: 0.3 mg/dL (ref 0.0–0.3)
Total Bilirubin: 1.3 mg/dL — ABNORMAL HIGH (ref 0.2–1.2)
Total Protein: 6.3 g/dL (ref 6.0–8.3)

## 2021-09-06 LAB — LDL CHOLESTEROL, DIRECT: Direct LDL: 121 mg/dL

## 2021-09-07 ENCOUNTER — Other Ambulatory Visit: Payer: Self-pay | Admitting: Family Medicine

## 2021-09-14 ENCOUNTER — Ambulatory Visit (INDEPENDENT_AMBULATORY_CARE_PROVIDER_SITE_OTHER): Payer: HMO

## 2021-09-14 VITALS — Ht 66.0 in | Wt 212.0 lb

## 2021-09-14 DIAGNOSIS — Z0000 Encounter for general adult medical examination without abnormal findings: Secondary | ICD-10-CM

## 2021-09-14 NOTE — Patient Instructions (Addendum)
  Andre [name] , Thank you for taking time to come for your Medicare Wellness Visit. I appreciate your ongoing commitment to your health goals. Please review the following plan we discussed and let me know if I can assist you in the future.   These are the goals we discussed:  Goals      DIET - INCREASE LEAN PROTEINS     Low sodium diet Low carb diet      Increase physical activity     Stay active as tolerated        This is a list of the screening recommended for you and due dates:  Health Maintenance  Topic Date Due   COVID-19 Vaccine (4 - Booster for Pfizer series) 09/30/2021*   Pneumonia Vaccine (2 - PPSV23 if available, else PCV20) 09/15/2022*   Flu Shot  11/16/2021   Tetanus Vaccine  02/09/2027   Zoster (Shingles) Vaccine  Completed   HPV Vaccine  Aged Out  *Topic was postponed. The date shown is not the original due date.

## 2021-09-14 NOTE — Progress Notes (Signed)
Subjective:   Andre [name] is a 86 y.o. male who presents for Medicare Annual/Subsequent preventive examination.  Review of Systems    No ROS.  Medicare Wellness Virtual Visit.  Visual/audio telehealth visit, UTA vital signs.   See social history for additional risk factors.   Cardiac Risk Factors include: male gender;advanced age (>44mn, >>69women);hypertension     Objective:    Today's Vitals   09/14/21 1015  Weight: 212 lb (96.2 kg)  Height: '5\' 6"'$  (1.676 m)   Body mass index is 34.22 kg/m.     09/14/2021   10:18 AM 07/05/2021    2:49 PM 05/11/2021    2:26 AM 04/20/2021    4:06 PM 04/14/2021    5:23 PM 04/11/2021   11:52 AM 01/04/2021    2:36 PM  Advanced Directives  Does Patient Have a Medical Advance Directive? Yes Yes No No No No No  Type of [place]       Does patient want to make changes to medical advance directive? No - Patient declined      No - Patient declined  Copy of HRuddin Chart? No - copy requested        Would patient like information on creating a medical advance directive?   No - Patient declined No - Patient declined No - Patient declined No - Patient declined No - Patient declined   Current Medications (verified) Outpatient Encounter Medications as of 09/14/2021  Medication Sig   albuterol (VENTOLIN HFA) 108 (90 Base) MCG/ACT inhaler Inhale 2 puffs into the lungs every 6 (six) hours as needed for wheezing or shortness of breath.   atorvastatin (LIPITOR) 10 MG tablet Take 1 tablet (10 mg total) by mouth daily.   buPROPion (WELLBUTRIN XL) 300 MG 24 hr tablet TAKE ONE TABLET BY MOUTH EVERY DAY   carbidopa-levodopa (SINEMET IR) 25-250 MG tablet Take 0.5 tablets by mouth 4 (four) times daily.   colchicine 0.6 MG tablet ON DAY 1 TAKE 2 TABLETS (1.'2MG'$ ) AT THE SIGN OF FIRST FLARE, FOLLOWED BY 1 TABLET AFTER 1 HOUR. DAY 2 AND AFTER TAKE 1 TAB DAILY   UNTIL FLARE RESOLVES   ELIQUIS 5 MG TABS tablet TAKE ONE TABLET BY MOUTH TWICE DAILY   entacapone (COMTAN) 200 MG tablet TAKE 1 TABLET BY MOUTH 3 TIMES DAILY (Patient taking differently: Take 200 mg by mouth 3 (three) times daily.)   esomeprazole (NEXIUM) 40 MG capsule TAKE 1 CAPSULE BY MOUTH ONCE DAILY   feeding supplement (ENSURE ENLIVE / ENSURE PLUS) LIQD Take 237 mLs by mouth 3 (three) times daily between meals.   fluticasone (FLONASE) 50 MCG/ACT nasal spray USE 2 PUFFS IN EACH NOSTRIL DAILY (Patient taking differently: Place 2 sprays into both nostrils daily.)   furosemide (LASIX) 40 MG tablet TAKE ONE TABLET BY MOUTH DAILY   GEMTESA 75 MG TABS Take 1 tablet by mouth daily.   hydrocortisone 2.5 % lotion For seborrheic dermatitis of the face apply to aa's QAM on Tuesday, Thursday, and Saturday.   ketoconazole (NIZORAL) 2 % cream Apply a thin coat to face QAM on Monday, Wednesday, and Friday.   ketoconazole (NIZORAL) 2 % shampoo Shampoo into the face and scalp let sit 5 minutes then wash off. Use 3d/wk.   lactulose (CHRONULAC) 10 GM/15ML solution TAKE 30 MLS BY MOUTH TWICE DAILY AS NEEDED FOR MODERATE CONSTIPATION   levothyroxine (SYNTHROID) 50 MCG tablet TAKE ONE  TABLET ON AN EMPTY STOMACH WITHA GLASS OF WATER AT LEAST 30 TO 60 MINUTES BEFORE BREAKFAST (Patient taking differently: Take 50 mcg by mouth daily before breakfast.)   losartan (COZAAR) 100 MG tablet TAKE 1 TABLET BY MOUTH DAILY   metoprolol tartrate (LOPRESSOR) 25 MG tablet Take 1 tablet (25 mg total) by mouth 2 (two) times daily.   Pimavanserin Tartrate (NUPLAZID) 34 MG CAPS Take 1 capsule by mouth daily   QUEtiapine (SEROQUEL) 25 MG tablet Take 1 tablet (25 mg total) by mouth at bedtime for 7 days.   QUEtiapine (SEROQUEL) 50 MG tablet Take 1 tablet (50 mg total) by mouth at bedtime. Complete 1 week of 25 mg dosing and then will increase to '50mg'$  tablet   sertraline (ZOLOFT) 50 MG tablet TAKE 1 TABLET BY MOUTH DAILY   silodosin  (RAPAFLO) 8 MG CAPS capsule TAKE 1 CAPSULE BY MOUTH ONCE DAILY WITH BREAKFAST   STIOLTO RESPIMAT 2.5-2.5 MCG/ACT AERS SMARTSIG:2 Puff(s) Via Inhaler Daily   No facility-administered encounter medications on file as of 09/14/2021.    Allergies (verified) Pravastatin and Prednisone   History: Past Medical History:  Diagnosis Date   Atherosclerosis of abdominal aorta (HCC)    Basal cell carcinoma 03/04/2008   Right nose supratip.    CAD (coronary artery disease)    CABG 1998   Cervical spondylosis 10/01/2013   Chronic diastolic CHF (congestive heart failure) ([place])    a. 07/2016 Echo: >55%; b. 10/2016 Echo: EF 55-60%, Gr1 DD, Ao sclerosis w/o stenosis, sev dil LA; c. 08/2017 Echo: EF 60-65%, no rwma, Gr2 DD, mild AS, sev dil LA/RA.   Coronary artery disease    a. 1998 s/p mini-cabg @ [facility] - LIMA->LAD;  b. 07/2016 St Echo: Inadequate HR w/ HTN response;  c.  08/2016 MV: EF 67%, no ischemia; d. 10/2016 NSTEMI/Cath: RCA 95p (4.0x26 Onyx DES), LIMA->LAD nl; e. 09/2017 Cath: LM 40/30, LAD 100ost, RI 80, LCX nl, OM2/3 nl, RCA patent stent, 81m LIMA->LAD nl-->Med Rx.   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 s/p DCCV-->maintaining sinus on amiodarone;  b. CHA2DS2VASc = 5-->eliquis.   Parkinson's disease ([place]    tremors   Pleural effusion, right    a. 09/2017 s/p thoracentesis.   PNA (pneumonia) 08/26/2017   Pulmonary embolism ([place] 2011   Pulmonary fibrosis ([facility]    Secondary erythrocytosis 01/28/2015   Sleep apnea    wears CPAP   Squamous cell carcinoma of skin 03/19/2015   Right lateral crown. KA-like pattern   Thrombocytopenia  ([place]    Past Surgical History:  Procedure Laterality Date   BACK SURGERY  1960   CARDIAC CATHETERIZATION     CATARACT EXTRACTION Right    CATARACT EXTRACTION Bilateral    CHOLECYSTECTOMY  2010   COLONOSCOPY WITH PROPOFOL N/A 06/07/2018   Procedure: COLONOSCOPY WITH PROPOFOL;  Surgeon: SLollie Sails MD;  [facility];  Service: Endoscopy;  Laterality: N/A;   CORONARY ARTERY BYPASS GRAFT  01/07/1997   CORONARY STENT INTERVENTION N/A 10/31/2016   Procedure: Coronary Stent Intervention;  Surgeon: AWellington Hampshire MD;  Location:  [facility];  Service: Cardiovascular;  Laterality: N/A;   ELECTROPHYSIOLOGIC STUDY N/A 07/14/2015   Procedure: CARDIOVERSION;  Surgeon: Yolonda Kida, MD;  Location: [facility];  Service: Cardiovascular;  Laterality: N/A;   ELECTROPHYSIOLOGIC STUDY N/A 10/12/2015   Procedure: CARDIOVERSION;  Surgeon: Minna Merritts, MD;  Location: [facility];  Service: Cardiovascular;  Laterality: N/A;   LEFT HEART CATH AND CORONARY ANGIOGRAPHY N/A 10/31/2016   Procedure: Left Heart Cath and Coronary Angiography;  Surgeon: Wellington Hampshire, MD;  Location: [facility];  Service: Cardiovascular;  Laterality: N/A;   OTHER SURGICAL HISTORY  1998   Bypass   RIGHT/LEFT HEART CATH AND CORONARY ANGIOGRAPHY N/A 09/18/2017   Procedure: RIGHT/LEFT HEART CATH AND CORONARY ANGIOGRAPHY;  Surgeon: Wellington Hampshire, MD;  Location: [facility];  Service: Cardiovascular;  Laterality: N/A;   Family History  Problem Relation Age of Onset   Alcohol abuse Father    Parkinson's disease Neg Hx    Social History   Socioeconomic History   Marital status: Married    Spouse name: [name]   Number of children: 1   Years of education: 12   Highest education level: Not on file  Occupational History   Occupation: Retired    Comment: Designer, television/film set  Tobacco Use   Smoking status: Former    Packs/day: 1.00    Years: 10.00    Pack years: 10.00     Types: Cigarettes, Pipe, Cigars    Quit date: 04/18/1972    Years since quitting: 49.4   Smokeless tobacco: Former    Types: Chew    Quit date: 04/18/1972  Vaping Use   Vaping Use: Never used  Substance and Sexual Activity   Alcohol use: Yes    Alcohol/week: 4.0 standard drinks    Types: 2 Cans of beer, 2 Shots of liquor per week    Comment: per 2 weeks    Drug use: No   Sexual activity: Yes  Other Topics Concern   Not on file  Social History Narrative   Lives w/ wife   Caffeine use: none   Right-handed   Social Determinants of Health   Financial Resource Strain: Low Risk    Difficulty of Paying Living Expenses: Not hard at all  Food Insecurity: No Food Insecurity   Worried About Charity fundraiser in the Last Year: Never true   Arboriculturist in the Last Year: Never true  Transportation Needs: No Transportation Needs   Lack of Transportation (Medical): No   Lack of Transportation (Non-Medical): No  Physical Activity: Not on file  Stress: No Stress Concern Present   Feeling of Stress : Not at all  Social Connections: Unknown   Frequency of Communication with Friends and Family: Not on file   Frequency of Social Gatherings with Friends and Family: Not on file   Attends Religious Services: Not on file   Active Member of Clubs or Organizations: Not on file   Attends Archivist Meetings: Not on file   Marital Status: Married   Tobacco Counseling Counseling given: Not Answered  Clinical Intake: Pre-visit preparation completed: Yes        Diabetes: No  How often do you need to have someone help you when you read instructions, pamphlets, or other written materials from your doctor or pharmacy?: 3 - Sometimes   Interpreter Needed?: No    Activities of Daily Living    09/14/2021   10:27 AM 05/11/2021  3:00 PM  In your present state of health, do you have any difficulty performing the following activities:  Hearing? 1 1  Comment Hearing aids    Vision? 0 1  Difficulty concentrating or making decisions? 1 1  Comment Dementia associated with Parkinson's Disease   Walking or climbing stairs? 0 1  Dressing or bathing? 0 0  Doing errands, shopping? 1 1  Comment Wife/family assist with driving   Preparing Food and eating ? N   Using the Toilet? N   In the past six months, have you accidently leaked urine? Y   Comment Followed by Urology. Appointment scheduled tomorrow with Dr. Bernardo Heater.   Do you have problems with loss of bowel control? N   Managing your Medications? Y   Comment Wife assist   Managing your Finances? Y   Comment Wife assist   Housekeeping or managing your Housekeeping? N    Patient Care Team: [name], MD as PCP - General (Family Medicine) Wellington Hampshire, MD as PCP - Cardiology (Cardiology)  Indicate any recent Medical Services you may have received from other than [facility] providers in the past year (date may be approximate).     Assessment:   This is a routine wellness examination for [place].  Virtual Visit via Telephone Note  I connected with  [name] on 09/14/21 at 10:30 AM EDT by telephone and verified that I am speaking with the correct person using two identifiers.  Persons participating in the virtual visit: patient/Nurse Health Advisor   I discussed the limitations of performing an evaluation and management service by telehealth. We continued and completed visit with audio only. Some vital signs may be absent or patient reported.   Hearing/Vision screen Hearing Screening - Comments:: Hearing aids   Vision Screening - Comments:: Followed by [facility]  Annual visits  They have regular follow up with the ophthalmologist  Dietary issues and exercise activities discussed: Current Exercise Habits: The patient does not participate in regular exercise at present Regular diet   Goals Addressed             This Visit's Progress    DIET - INCREASE LEAN PROTEINS   On track     Low sodium diet Low carb diet      Increase physical activity       Stay active as tolerated       Depression Screen    09/14/2021   10:17 AM 07/07/2021   11:20 AM 03/08/2021    2:58 PM 06/02/2020   11:05 AM 03/03/2020   12:57 PM 09/30/2019    1:40 PM 07/05/2019    1:19 PM  PHQ 2/9 Scores  PHQ - 2 Score 0 0 0 0 0 0 0    Fall Risk    09/14/2021   10:27 AM 03/08/2021    2:58 PM 05/26/2020    9:32 AM 04/20/2020    1:46 PM 03/03/2020   12:57 PM  Fall Risk   Falls in the past year? 0 0 1 1 0  Number falls in past yr:  0 1 1 0  Injury with Fall?  0 0 1   Risk for fall due to :  History of fall(s) History of fall(s) Impaired balance/gait;Impaired mobility   Follow up Falls evaluation completed Falls evaluation completed Falls evaluation completed Falls evaluation completed Falls evaluation completed   FALL RISK PREVENTION PERTAINING TO THE HOME: Home free of loose throw rugs in walkways, pet beds, electrical cords,  etc? Yes  Adequate lighting in your home to reduce risk of falls? Yes   ASSISTIVE DEVICES UTILIZED TO PREVENT FALLS: Use of a cane, walker or w/c? No   TIMED UP AND GO: Was the test performed? No .   Cognitive Function: Patient is alert.    10/22/2020   12:03 PM 04/23/2020    2:32 PM  MMSE - Mini Mental State Exam  Orientation to time 4 4  Orientation to Place 5 5  Registration 3 3  Attention/ Calculation 2 5  Recall 0 0  Language- name 2 objects 2 2  Language- repeat 1 1  Language- follow 3 step command 3 3  Language- read & follow direction 1 1  Write a sentence 0 1  Copy design 1 0  Total score 22 25        12/31/2018   12:23 PM 12/29/2017   12:39 PM  6CIT Screen  What Year? 0 points 0 points  What month? 0 points 0 points  What time? 0 points 0 points  Count back from 20 0 points 0 points  Months in reverse 0 points 0 points  Repeat phrase 0 points 0 points  Total Score 0 points 0 points   Immunizations Immunization History  Administered  Date(s) Administered   Fluad Quad(high Dose 65+) 12/11/2018, 12/24/2019, 03/08/2021   Influenza Split 03/18/2012   Influenza, High Dose Seasonal PF 01/13/2016, 01/26/2017, 12/29/2017   Influenza-Unspecified 01/06/2016, 01/26/2017   PFIZER(Purple Top)SARS-COV-2 Vaccination 05/10/2019, 05/31/2019, 02/08/2020   Pneumococcal Conjugate-13 05/10/2016   Td 02/08/2017   Zoster Recombinat (Shingrix) 12/07/2017, 04/02/2018   Zoster, Live 04/19/2011   Pneumococcal vaccine status: Due, Education has been provided regarding the importance of this vaccine. Advised may receive this vaccine at local pharmacy or Health Dept. Aware to provide a copy of the vaccination record if obtained from local pharmacy or Health Dept. Verbalized acceptance and understanding.  Screening Tests Health Maintenance  Topic Date Due   COVID-19 Vaccine (4 - Booster for Pfizer series) 09/30/2021 (Originally 04/04/2020)   Pneumonia Vaccine 32+ Years old (2 - PPSV23 if available, else PCV20) 09/15/2022 (Originally 05/10/2017)   INFLUENZA VACCINE  11/16/2021   TETANUS/TDAP  02/09/2027   Zoster Vaccines- Shingrix  Completed   HPV VACCINES  Aged Out   Health Maintenance There are no preventive care reminders to display for this patient.  CT Chest High Resolution: completed 10/23/20  Vision Screening: Recommended annual ophthalmology exams for early detection of glaucoma and other disorders of the eye.  Dental Screening: Recommended annual dental exams for proper oral hygiene  Community Resource Referral / Chronic Care Management: CRR required this visit?  No   CCM required this visit?  No      Plan:   Keep all routine maintenance appointments.   I have personally reviewed and noted the following in the patient's chart:   Medical and social history Use of alcohol, tobacco or illicit drugs  Current medications and supplements including opioid prescriptions. Patient is not currently taking opioid  prescriptions. Functional ability and status Nutritional status Physical activity Advanced directives List of other physicians Hospitalizations, surgeries, and ER visits in previous 12 months Vitals Screenings to include cognitive, depression, and falls Referrals and appointments  In addition, I have reviewed and discussed with patient certain preventive protocols, quality metrics, and best practice recommendations. A written personalized care plan for preventive services as well as general preventive health recommendations were provided to patient.     OBrien-Blaney, Kimmi Acocella L, LPN  09/14/2021       

## 2021-09-15 ENCOUNTER — Ambulatory Visit: Payer: HMO | Admitting: Urology

## 2021-09-15 ENCOUNTER — Encounter: Payer: Self-pay | Admitting: Urology

## 2021-09-15 VITALS — BP 146/81 | HR 82 | Ht 67.0 in | Wt 215.0 lb

## 2021-09-15 DIAGNOSIS — N3941 Urge incontinence: Secondary | ICD-10-CM | POA: Diagnosis not present

## 2021-09-15 DIAGNOSIS — N401 Enlarged prostate with lower urinary tract symptoms: Secondary | ICD-10-CM | POA: Diagnosis not present

## 2021-09-15 DIAGNOSIS — R350 Frequency of micturition: Secondary | ICD-10-CM | POA: Diagnosis not present

## 2021-09-15 LAB — BLADDER SCAN AMB NON-IMAGING: Scan Result: 33

## 2021-09-15 MED ORDER — MIRABEGRON ER 25 MG PO TB24: 25.0000 mg | ORAL_TABLET | Freq: Every day | ORAL | 0 refills | Status: DC

## 2021-09-15 NOTE — Progress Notes (Signed)
09/15/2021 2:16 PM   [name] 1936/03/10 [id number]  Referring provider: [name], MD [address] [place],   [address]  Chief Complaint  Patient presents with   Urinary Frequency    HPI: 86 y.o. male presents for annual follow-up.  He presents today with his wife  Initially seen 07/09/2020 for severe lower urinary tract symptoms (IPSS 25/35) Most bothersome symptoms frequency, urgency and urge incontinence No improvement on silodosin and at his visit last year he was started on [place] which he has continued.  Does not feel this medication has been effective Denies flank, abdominal or pelvic pain Denies dysuria, gross hematuria + Parkinson's disease   PMH: Past Medical History:  Diagnosis Date   Atherosclerosis of abdominal aorta ([place])    Basal cell carcinoma 03/04/2008   Right nose supratip.    CAD (coronary artery disease)    CABG 1998   Cervical spondylosis 10/01/2013   Chronic diastolic CHF (congestive heart failure) ([place])    a. 07/2016 Echo: >55%; b. 10/2016 Echo: EF 55-60%, Gr1 DD, Ao sclerosis w/o stenosis, sev dil LA; c. 08/2017 Echo: EF 60-65%, no rwma, Gr2 DD, mild AS, sev dil LA/RA.   Coronary artery disease    a. 1998 s/p mini-cabg @ [facility] - LIMA->LAD;  b. 07/2016 St Echo: Inadequate HR w/ HTN response;  c.  08/2016 MV: EF 67%, no ischemia; d. 10/2016 NSTEMI/Cath: RCA 95p (4.0x26 Onyx DES), LIMA->LAD nl; e. 09/2017 Cath: LM 40/30, LAD 100ost, RI 80, LCX nl, OM2/3 nl, RCA patent stent, 55m LIMA->LAD nl-->Med Rx.   DDD (degenerative disc disease), cervical    DDD (degenerative disc disease), lumbar    Dementia with parkinsonism ([place] 06/03/2020   Depression    Gait abnormality 07/31/2019   GERD (gastroesophageal reflux disease)    History of SCC (squamous cell carcinoma) of skin 07/27/2020   right forearm / EDC   Hyperlipidemia    Hypertension    Hypothyroidism    PAF (paroxysmal atrial fibrillation) ([place]    a. s/p  DCCV-->maintaining sinus on amiodarone;  b. CHA2DS2VASc = 5-->eliquis.   Parkinson's disease ([facility]    tremors   Pleural effusion, right    a. 09/2017 s/p thoracentesis.   PNA (pneumonia) 08/26/2017   Pulmonary embolism ([facility] 2011   Pulmonary fibrosis ([facility]    Secondary erythrocytosis 01/28/2015   Sleep apnea    wears CPAP   Squamous cell carcinoma of skin 03/19/2015   Right lateral crown. KA-like pattern   Thrombocytopenia ([place]     Surgical History: Past Surgical History:  Procedure Laterality Date   BACK SURGERY  1960   CARDIAC CATHETERIZATION     CATARACT EXTRACTION Right    CATARACT EXTRACTION Bilateral    CHOLECYSTECTOMY  2010   COLONOSCOPY WITH PROPOFOL N/A 06/07/2018   Procedure: COLONOSCOPY WITH PROPOFOL;  Surgeon: SLollie Sails MD;  [facility];  Service: Endoscopy;  Laterality: N/A;   CORONARY ARTERY BYPASS GRAFT  01/07/1997   CORONARY STENT INTERVENTION N/A 10/31/2016   Procedure: Coronary Stent Intervention;  Surgeon: AWellington Hampshire MD;  [facility];  Service: Cardiovascular;  Laterality: N/A;   ELECTROPHYSIOLOGIC STUDY N/A 07/14/2015   Procedure: CARDIOVERSION;  Surgeon: DYolonda Kida MD;  Location: [facility];  Service: Cardiovascular;  Laterality: N/A;   ELECTROPHYSIOLOGIC STUDY N/A 10/12/2015   Procedure: CARDIOVERSION;  Surgeon: TMinna Merritts MD;  Location: [facility];  Service: Cardiovascular;  Laterality: N/A;   LEFT HEART CATH AND  CORONARY ANGIOGRAPHY N/A 10/31/2016   Procedure: Left Heart Cath and Coronary Angiography;  Surgeon: Wellington Hampshire, MD;  Location: [facility];  Service: Cardiovascular;  Laterality: N/A;   OTHER SURGICAL HISTORY  1998   Bypass   RIGHT/LEFT HEART CATH AND CORONARY ANGIOGRAPHY N/A 09/18/2017   Procedure: RIGHT/LEFT HEART CATH AND CORONARY ANGIOGRAPHY;  Surgeon: Wellington Hampshire, MD;  Location: [place] [facility];  Service: Cardiovascular;  Laterality: N/A;    Home  Medications:  Allergies as of 09/15/2021       Reactions   Pravastatin Other (See Comments)   Prednisone Other (See Comments)   Pt states that med makes him hyper Pt states that med makes him hyper        Medication List        Accurate as of Sep 15, 2021  2:16 PM. If you have any questions, ask your nurse or doctor.          albuterol 108 (90 Base) MCG/ACT inhaler Commonly known as: VENTOLIN HFA Inhale 2 puffs into the lungs every 6 (six) hours as needed for wheezing or shortness of breath.   atorvastatin 10 MG tablet Commonly known as: LIPITOR Take 1 tablet (10 mg total) by mouth daily.   buPROPion 300 MG 24 hr tablet Commonly known as: WELLBUTRIN XL TAKE ONE TABLET BY MOUTH EVERY DAY   carbidopa-levodopa 25-250 MG tablet Commonly known as: SINEMET IR Take 0.5 tablets by mouth 4 (four) times daily.   colchicine 0.6 MG tablet ON DAY 1 TAKE 2 TABLETS (1.'2MG'$ ) AT THE SIGN OF FIRST FLARE, FOLLOWED BY 1 TABLET AFTER 1 HOUR. DAY 2 AND AFTER TAKE 1 TAB DAILY  UNTIL FLARE RESOLVES   Eliquis 5 MG Tabs tablet Generic drug: apixaban TAKE ONE TABLET BY MOUTH TWICE DAILY   entacapone 200 MG tablet Commonly known as: COMTAN TAKE 1 TABLET BY MOUTH 3 TIMES DAILY   esomeprazole 40 MG capsule Commonly known as: NEXIUM TAKE 1 CAPSULE BY MOUTH ONCE DAILY   feeding supplement Liqd Take 237 mLs by mouth 3 (three) times daily between meals.   fluticasone 50 MCG/ACT nasal spray Commonly known as: FLONASE USE 2 PUFFS IN EACH NOSTRIL DAILY What changed: See the new instructions.   furosemide 40 MG tablet Commonly known as: LASIX TAKE ONE TABLET BY MOUTH DAILY   Gemtesa 75 MG Tabs Generic drug: Vibegron Take 1 tablet by mouth daily.   hydrocortisone 2.5 % lotion For seborrheic dermatitis of the face apply to aa's QAM on Tuesday, Thursday, and Saturday.   ketoconazole 2 % cream Commonly known as: NIZORAL Apply a thin coat to face QAM on Monday, Wednesday, and Friday.    ketoconazole 2 % shampoo Commonly known as: NIZORAL Shampoo into the face and scalp let sit 5 minutes then wash off. Use 3d/wk.   lactulose 10 GM/15ML solution Commonly known as: CHRONULAC TAKE 30 MLS BY MOUTH TWICE DAILY AS NEEDED FOR MODERATE CONSTIPATION   levothyroxine 50 MCG tablet Commonly known as: SYNTHROID TAKE ONE TABLET ON AN EMPTY STOMACH WITHA GLASS OF WATER AT LEAST 30 TO 60 MINUTES BEFORE BREAKFAST What changed: See the new instructions.   losartan 100 MG tablet Commonly known as: COZAAR TAKE 1 TABLET BY MOUTH DAILY   metoprolol tartrate 25 MG tablet Commonly known as: LOPRESSOR Take 1 tablet (25 mg total) by mouth 2 (two) times daily.   Nuplazid 34 MG Caps Generic drug: Pimavanserin Tartrate Take 1 capsule by mouth daily  QUEtiapine 25 MG tablet Commonly known as: SEROquel Take 1 tablet (25 mg total) by mouth at bedtime for 7 days.   QUEtiapine 50 MG tablet Commonly known as: SEROquel Take 1 tablet (50 mg total) by mouth at bedtime. Complete 1 week of 25 mg dosing and then will increase to '50mg'$  tablet   sertraline 50 MG tablet Commonly known as: ZOLOFT TAKE 1 TABLET BY MOUTH DAILY   silodosin 8 MG Caps capsule Commonly known as: RAPAFLO TAKE 1 CAPSULE BY MOUTH ONCE DAILY WITH BREAKFAST   Stiolto Respimat 2.5-2.5 MCG/ACT Aers Generic drug: Tiotropium Bromide-Olodaterol SMARTSIG:2 Puff(s) Via Inhaler Daily        Allergies:  Allergies  Allergen Reactions   Pravastatin Other (See Comments)   Prednisone Other (See Comments)    Pt states that med makes him hyper Pt states that med makes him hyper    Family History: Family History  Problem Relation Age of Onset   Alcohol abuse Father    Parkinson's disease Neg Hx     Social History:  reports that he quit smoking about 49 years ago. His smoking use included cigarettes, pipe, and cigars. He has a 10.00 pack-year smoking history. He quit smokeless tobacco use about 49 years ago.  His  smokeless tobacco use included chew. He reports current alcohol use of about 4.0 standard drinks per week. He reports that he does not use drugs.   Physical Exam: BP (!) 146/81   Pulse 82   Ht '5\' 7"'$  (1.702 m)   Wt 215 lb (97.5 kg)   BMI 33.67 kg/m   Constitutional:  Alert and oriented, No acute distress. HEENT: [place] AT, moist mucus membranes.  Trachea midline, no masses. Cardiovascular: No clubbing, cyanosis, or edema. Respiratory: Normal respiratory effort, no increased work of breathing.   Assessment & Plan:    1.  BPH with LUTS PVR today 33 mL His wife thinks he may have been taken off silodosin and is only on Gemtesa  2.  Urge incontinence No significant improvement on Gemtesa Trial Myrbetriq 25 mg daily If no improvement schedule cystoscopy Urinalysis today without RBCs/WBC   Abbie Sons, MD  [facility] [address], [place], [place] [address] [phone]

## 2021-09-16 LAB — MICROSCOPIC EXAMINATION: Bacteria, UA: NONE SEEN

## 2021-09-16 LAB — URINALYSIS, COMPLETE
Bilirubin, UA: NEGATIVE
Glucose, UA: NEGATIVE
Leukocytes,UA: NEGATIVE
Nitrite, UA: NEGATIVE
RBC, UA: NEGATIVE
Specific Gravity, UA: 1.025 (ref 1.005–1.030)
Urobilinogen, Ur: 0.2 mg/dL (ref 0.2–1.0)
pH, UA: 6 (ref 5.0–7.5)

## 2021-09-17 ENCOUNTER — Other Ambulatory Visit: Payer: Self-pay | Admitting: Family Medicine

## 2021-09-17 DIAGNOSIS — E785 Hyperlipidemia, unspecified: Secondary | ICD-10-CM

## 2021-09-17 MED ORDER — ROSUVASTATIN CALCIUM 5 MG PO TABS: 5.0000 mg | ORAL_TABLET | Freq: Every day | ORAL | 3 refills | Status: DC

## 2021-09-20 ENCOUNTER — Telehealth: Payer: Self-pay | Admitting: Cardiovascular Disease

## 2021-09-20 NOTE — Telephone Encounter (Signed)
To Dr. Fletcher Anon to review & advise.

## 2021-09-20 NOTE — Telephone Encounter (Signed)
Pt c/o medication issue:  1. Name of Medication:   mirabegron ER (MYRBETRIQ) 25 MG TB24 tablet    2. How are you currently taking this medication (dosage and times per day)?  Take 1 tablet (25 mg total) by mouth daily 3. Are you having a reaction (difficulty breathing--STAT)?  No  4. What is your medication issue? Pt's wife would like to know if Dr. Fletcher Anon agrees with pt being on medication while also on Lasix. Please advise

## 2021-09-22 NOTE — Telephone Encounter (Signed)
Yes, these 2 medications can be used together.

## 2021-09-22 NOTE — Telephone Encounter (Signed)
Attempted to call the patient's wife back. No answer- I left a detailed message on the identified home voice mail that myrbetriq and lasix may be used together per Dr. Fletcher Anon (OK per [facility]).   I advised them to please call back if they have any further questions/ concerns.

## 2021-09-24 ENCOUNTER — Other Ambulatory Visit: Payer: Self-pay | Admitting: Family Medicine

## 2021-09-24 ENCOUNTER — Other Ambulatory Visit: Payer: Self-pay | Admitting: Neurology

## 2021-09-27 ENCOUNTER — Other Ambulatory Visit: Payer: Self-pay | Admitting: Family Medicine

## 2021-10-11 ENCOUNTER — Other Ambulatory Visit: Payer: Self-pay

## 2021-10-11 ENCOUNTER — Ambulatory Visit: Payer: HMO | Admitting: Family Medicine

## 2021-10-11 MED ORDER — MIRABEGRON ER 25 MG PO TB24: 25.0000 mg | ORAL_TABLET | Freq: Every day | ORAL | 11 refills | Status: DC

## 2021-10-11 NOTE — Telephone Encounter (Signed)
[name] called stating that the Myrbetriq samples are working well and would like a script sent into [facility]. Per last office note script was sent

## 2021-10-12 ENCOUNTER — Ambulatory Visit (INDEPENDENT_AMBULATORY_CARE_PROVIDER_SITE_OTHER): Payer: HMO | Admitting: Family Medicine

## 2021-10-12 ENCOUNTER — Encounter: Payer: Self-pay | Admitting: Family Medicine

## 2021-10-12 VITALS — BP 120/80 | HR 58 | Temp 97.9°F | Ht 67.0 in | Wt 211.0 lb

## 2021-10-12 DIAGNOSIS — E785 Hyperlipidemia, unspecified: Secondary | ICD-10-CM

## 2021-10-12 DIAGNOSIS — R42 Dizziness and giddiness: Secondary | ICD-10-CM | POA: Diagnosis not present

## 2021-10-12 DIAGNOSIS — J449 Chronic obstructive pulmonary disease, unspecified: Secondary | ICD-10-CM

## 2021-10-12 DIAGNOSIS — T148XXA Other injury of unspecified body region, initial encounter: Secondary | ICD-10-CM | POA: Diagnosis not present

## 2021-10-12 DIAGNOSIS — G20 Parkinson's disease: Secondary | ICD-10-CM

## 2021-10-12 DIAGNOSIS — F0280 Dementia in other diseases classified elsewhere without behavioral disturbance: Secondary | ICD-10-CM | POA: Diagnosis not present

## 2021-10-12 NOTE — Progress Notes (Signed)
Marikay Alar, MD Phone: [phone]  Andre [name] is a 86 y.o. male who presents today for follow-up.  Parkinson's: Patient feels as though this is advancing.  He can still talk and walk adequately.  He does still drive some though minimizes this.  His neurologist try to get him on a new medicine for his hallucinations though they have not been able to afford it.  They have not started back on Seroquel.  He is on Sinemet and Comtan.  He has not gotten into see geriatric psychiatry.  He reports his hallucinations are 80% better.  COPD: No cough or wheezing.  No significant change in his shortness of breath.  He is on [employer].  Dizziness: Patient reports this is stable.  It is mild and feels like he is rocking on a boat.  No ear fullness.  No tinnitus.  Bruising: He continues on Eliquis for A-fib.  He notes the bruising has improved.  It typically only occurs if he hits his arms on something.  Social History   Tobacco Use  Smoking Status Former   Packs/day: 1.00   Years: 10.00   Total pack years: 10.00   Types: Cigarettes, Pipe, Cigars   Quit date: 04/18/1972   Years since quitting: 49.5  Smokeless Tobacco Former   Types: Chew   Quit date: 04/18/1972    Current Outpatient Medications on File Prior to Visit  Medication Sig Dispense Refill   albuterol (VENTOLIN HFA) 108 (90 Base) MCG/ACT inhaler Inhale 2 puffs into the lungs every 6 (six) hours as needed for wheezing or shortness of breath. 8 g 6   buPROPion (WELLBUTRIN XL) 300 MG 24 hr tablet TAKE 1 TABLET BY MOUTH DAILY 90 tablet 0   carbidopa-levodopa (SINEMET IR) 25-250 MG tablet Take 0.5 tablets by mouth 4 (four) times daily. 180 tablet 3   colchicine 0.6 MG tablet ON DAY 1 TAKE 2 TABLETS (1.2MG ) AT THE SIGN OF FIRST FLARE, FOLLOWED BY 1 TABLET AFTER 1 HOUR. DAY 2 AND AFTER TAKE 1 TAB DAILY  UNTIL FLARE RESOLVES 30 tablet 1   ELIQUIS 5 MG TABS tablet TAKE ONE TABLET BY MOUTH TWICE DAILY 180 tablet 1   entacapone (COMTAN) 200 MG  tablet TAKE 1 TABLET BY MOUTH 3 TIMES DAILY 270 tablet 0   esomeprazole (NEXIUM) 40 MG capsule TAKE 1 CAPSULE BY MOUTH ONCE DAILY 30 capsule 2   feeding supplement (ENSURE ENLIVE / ENSURE PLUS) LIQD Take 237 mLs by mouth 3 (three) times daily between meals. 237 mL 12   fluticasone (FLONASE) 50 MCG/ACT nasal spray USE 2 PUFFS IN EACH NOSTRIL DAILY (Patient taking differently: Place 2 sprays into both nostrils daily.) 16 g 3   furosemide (LASIX) 40 MG tablet TAKE 1 TABLET BY MOUTH DAILY 30 tablet 0   hydrocortisone 2.5 % lotion For seborrheic dermatitis of the face apply to aa's QAM on Tuesday, Thursday, and Saturday. 59 mL 0   ketoconazole (NIZORAL) 2 % cream Apply a thin coat to face QAM on Monday, Wednesday, and Friday. 60 g 3   ketoconazole (NIZORAL) 2 % shampoo Shampoo into the face and scalp let sit 5 minutes then wash off. Use 3d/wk. 120 mL 3   lactulose (CHRONULAC) 10 GM/15ML solution TAKE 30 MLS BY MOUTH TWICE DAILY AS NEEDED FOR MODERATE CONSTIPATION 236 mL 0   levothyroxine (SYNTHROID) 50 MCG tablet TAKE ONE TABLET ON AN EMPTY STOMACH WITHA GLASS OF WATER AT LEAST 30 TO 60 MINUTES BEFORE BREAKFAST (Patient taking differently: Take  50 mcg by mouth daily before breakfast.) 90 tablet 1   losartan (COZAAR) 100 MG tablet TAKE 1 TABLET BY MOUTH DAILY 90 tablet 1   metoprolol tartrate (LOPRESSOR) 25 MG tablet Take 1 tablet (25 mg total) by mouth 2 (two) times daily. 180 tablet 3   mirabegron ER (MYRBETRIQ) 25 MG TB24 tablet Take 1 tablet (25 mg total) by mouth daily. 30 tablet 11   Pimavanserin Tartrate (NUPLAZID) 34 MG CAPS Take 1 capsule by mouth daily 30 capsule 5   QUEtiapine (SEROQUEL) 50 MG tablet Take 1 tablet (50 mg total) by mouth at bedtime. Complete 1 week of 25 mg dosing and then will increase to 50mg  tablet 30 tablet 5   rosuvastatin (CRESTOR) 5 MG tablet Take 1 tablet (5 mg total) by mouth daily. 90 tablet 3   sertraline (ZOLOFT) 50 MG tablet TAKE 1 TABLET BY MOUTH DAILY 30 tablet 3    silodosin (RAPAFLO) 8 MG CAPS capsule TAKE 1 CAPSULE BY MOUTH ONCE DAILY WITH BREAKFAST 30 capsule 3   STIOLTO RESPIMAT 2.5-2.5 MCG/ACT AERS SMARTSIG:2 Puff(s) Via Inhaler Daily     QUEtiapine (SEROQUEL) 25 MG tablet Take 1 tablet (25 mg total) by mouth at bedtime for 7 days. 7 tablet 0   No current facility-administered medications on file prior to visit.     ROS see history of present illness  Objective  Physical Exam Vitals:   10/12/21 1156  BP: 120/80  Pulse: (!) 58  Temp: 97.9 F (36.6 C)  SpO2: 95%    BP Readings from Last 3 Encounters:  10/12/21 120/80  09/15/21 (!) 146/81  08/02/21 131/74   Wt Readings from Last 3 Encounters:  10/12/21 211 lb (95.7 kg)  09/15/21 215 lb (97.5 kg)  09/14/21 212 lb (96.2 kg)    Physical Exam Constitutional:      General: He is not in acute distress.    Appearance: He is not diaphoretic.  Cardiovascular:     Rate and Rhythm: Normal rate and regular rhythm.     Heart sounds: Normal heart sounds.  Pulmonary:     Effort: Pulmonary effort is normal.     Breath sounds: Normal breath sounds.  Musculoskeletal:     Right lower leg: No edema.     Left lower leg: No edema.  Skin:    General: Skin is warm and dry.  Neurological:     Mental Status: He is alert.      Assessment/Plan: Please see individual problem list.  Problem List Items Addressed This Visit     COPD (chronic obstructive pulmonary disease) (HCC) - Primary (Chronic)    Chronic issue.  Generally stable.  He will continue Stiolto 2 puffs daily.      Dementia associated with Parkinson's disease (HCC) (Chronic)    Refer to geriatric psychiatry for help in managing behavioral symptoms from his dementia related to Parkinson's disease.      Relevant Orders   Ambulatory referral to Psychiatry   Dizziness (Chronic)    Chronic stable issue.  Potentially related to Parkinson's or his Parkinson's medications.  I encouraged him to discuss this with the neurologist to  see what they thought.      Hyperlipidemia with target LDL less than 70 (Chronic)    Continue Crestor 5 mg daily.  Check labs.      Relevant Orders   LDL cholesterol, direct   Hepatic function panel   Parkinson's disease (HCC) (Chronic)    Chronic issue.  Seems to be  progressing with time.  Hallucinations have improved.  He will continue to work with neurology on the new medication.  They will discuss the Seroquel with the neurologist as well.  Discussed minimizing driving as much as possible and if he feels unsafe at any time he should not drive.      Bruising    Related to being on Eliquis and thinning of his skin.  They will monitor for any worsening of his bruising.       Return in about 3 months (around 01/12/2022).   Marikay Alar, MD [facility]

## 2021-10-12 NOTE — Assessment & Plan Note (Addendum)
Chronic issue.  Seems to be progressing with time.  Hallucinations have improved.  He will continue to work with neurology on the new medication.  They will discuss the Seroquel with the neurologist as well.  Discussed minimizing driving as much as possible and if he feels unsafe at any time he should not drive.

## 2021-10-12 NOTE — Assessment & Plan Note (Signed)
Chronic issue.  Generally stable.  He will continue Stiolto 2 puffs daily.

## 2021-10-14 ENCOUNTER — Other Ambulatory Visit (HOSPITAL_COMMUNITY): Payer: Self-pay

## 2021-10-26 ENCOUNTER — Ambulatory Visit: Payer: HMO | Admitting: Cardiovascular Disease

## 2021-10-26 ENCOUNTER — Encounter: Payer: Self-pay | Admitting: Cardiovascular Disease

## 2021-10-26 VITALS — BP 112/74 | HR 69 | Ht 66.0 in | Wt 212.1 lb

## 2021-10-26 DIAGNOSIS — I10 Essential (primary) hypertension: Secondary | ICD-10-CM | POA: Diagnosis not present

## 2021-10-26 DIAGNOSIS — I4819 Other persistent atrial fibrillation: Secondary | ICD-10-CM | POA: Diagnosis not present

## 2021-10-26 DIAGNOSIS — E785 Hyperlipidemia, unspecified: Secondary | ICD-10-CM

## 2021-10-26 DIAGNOSIS — I5032 Chronic diastolic (congestive) heart failure: Secondary | ICD-10-CM

## 2021-10-26 DIAGNOSIS — I2510 Atherosclerotic heart disease of native coronary artery without angina pectoris: Secondary | ICD-10-CM | POA: Diagnosis not present

## 2021-10-26 NOTE — Patient Instructions (Signed)

## 2021-10-26 NOTE — Progress Notes (Signed)
Cardiology Office Note   Date:  10/26/2021   ID:  Andre DYMEK, DOB 01-Aug-1935, MRN [id number]  PCP:  [name], MD  Cardiologist:   [name], MD   Chief Complaint  Patient presents with   Other    6 Month f/u c/o feeling tired. Meds reviewed verbally with pt.        History of Present Illness: Andre [name] is a 86 y.o. male who presents for a follow-up visit regarding coronary artery disease and atrial fibrillation.   He has known history of coronary artery disease status post 1 vessel CABG with LIMA to LAD in 1998, hypertension, hyperlipidemia, chronic diastolic heart aure,, previous pulmonary embolism, Parkinson's, permanent atrial fibrillation and morbid obesity.  He was hospitalized in July of 2018 with a small non-ST elevation myocardial infarction. Echocardiogram showed normal LV systolic function with severely dilated left atrium. Cardiac catheterization showed occluded native LAD with patent LIMA, severe proximal RCA stenosis with heavy thrombus and moderate distal left main stenosis with significant ostial stenosis in the ramus branch. EF was 50-55% with mildly elevated left ventricular end-diastolic pressure. I performed successful angioplasty and drug-eluting stent placement to the right coronary artery which was complicated by embolization into a large RV branch at the lesion site. This was treated with balloon angioplasty.  A right and left cardiac catheterization was done in June of 2019 due to exertional dyspnea which showed  patent LIMA to LAD and patent RCA stent.  There was significant ostial disease in ramus branch as well as first diagonal.  Both of these were unchanged from previous cardiac catheterization.  Right heart catheterization showed only mildly elevated filling pressures, minimal pulmonary hypertension and normal cardiac output.   He had an echocardiogram done in November 2022 which showed normal LV systolic function with calcified aortic  valve without significant stenosis.  He has known history of PVCs.  He was hospitalized and January with mental status changes and a fall.  His mental status changes were felt to be due to Parkinson's and dementia.  The patient has been doing reasonably well and has been eating healthier and lost some weight.  He reports stable exertional dyspnea with no chest pain.  He has minimal leg edema.  Past Medical History:  Diagnosis Date   Atherosclerosis of abdominal aorta ([place])    Basal cell carcinoma 03/04/2008   Right nose supratip.    CAD (coronary artery disease)    CABG 1998   Cervical spondylosis 10/01/2013   Chronic diastolic CHF (congestive heart failure) ([place])    a. 07/2016 Echo: >55%; b. 10/2016 Echo: EF 55-60%, Gr1 DD, Ao sclerosis w/o stenosis, sev dil LA; c. 08/2017 Echo: EF 60-65%, no rwma, Gr2 DD, mild AS, sev dil LA/RA.   Coronary artery disease    a. 1998 s/p mini-cabg @ [facility] - LIMA->LAD;  b. 07/2016 St Echo: Inadequate HR w/ HTN response;  c.  08/2016 MV: EF 67%, no ischemia; d. 10/2016 NSTEMI/Cath: RCA 95p (4.0x26 Onyx DES), LIMA->LAD nl; e. 09/2017 Cath: LM 40/30, LAD 100ost, RI 80, LCX nl, OM2/3 nl, RCA patent stent, 38m LIMA->LAD nl-->Med Rx.   DDD (degenerative disc disease), cervical    DDD (degenerative disc disease), lumbar    Dementia with parkinsonism ([place] 06/03/2020   Depression    Gait abnormality 07/31/2019   GERD (gastroesophageal reflux disease)    History of SCC (squamous cell carcinoma) of skin 07/27/2020   right forearm / EDC   Hyperlipidemia  Hypertension    Hypothyroidism    PAF (paroxysmal atrial fibrillation) ([facility])    a. s/p DCCV-->maintaining sinus on amiodarone;  b. CHA2DS2VASc = 5-->eliquis.   Parkinson's disease ([place])    tremors   Pleural effusion, right    a. 09/2017 s/p thoracentesis.   PNA (pneumonia) 08/26/2017   Pulmonary embolism ([place]) 2011   Pulmonary fibrosis ([place])    Secondary erythrocytosis 01/28/2015   Sleep apnea    wears CPAP    Squamous cell carcinoma of skin 03/19/2015   Right lateral crown. KA-like pattern   Thrombocytopenia ([place])     Past Surgical History:  Procedure Laterality Date   BACK SURGERY  1960   CARDIAC CATHETERIZATION     CATARACT EXTRACTION Right    CATARACT EXTRACTION Bilateral    CHOLECYSTECTOMY  2010   COLONOSCOPY WITH PROPOFOL N/A 06/07/2018   Procedure: COLONOSCOPY WITH PROPOFOL;  Surgeon: Lollie Sails, MD;  Location: [facility] ENDOSCOPY;  Service: Endoscopy;  Laterality: N/A;   CORONARY ARTERY BYPASS GRAFT  01/07/1997   CORONARY STENT INTERVENTION N/A 10/31/2016   Procedure: Coronary Stent Intervention;  Surgeon: Wellington Hampshire, MD;  Location: [place] CV LAB;  Service: Cardiovascular;  Laterality: N/A;   ELECTROPHYSIOLOGIC STUDY N/A 07/14/2015   Procedure: CARDIOVERSION;  Surgeon: Yolonda Kida, MD;  Location: [facility] ORS;  Service: Cardiovascular;  Laterality: N/A;   ELECTROPHYSIOLOGIC STUDY N/A 10/12/2015   Procedure: CARDIOVERSION;  Surgeon: Minna Merritts, MD;  Location: [facility] ORS;  Service: Cardiovascular;  Laterality: N/A;   LEFT HEART CATH AND CORONARY ANGIOGRAPHY N/A 10/31/2016   Procedure: Left Heart Cath and Coronary Angiography;  Surgeon: Wellington Hampshire, MD;  Location: [place] CV LAB;  Service: Cardiovascular;  Laterality: N/A;   OTHER SURGICAL HISTORY  1998   Bypass   RIGHT/LEFT HEART CATH AND CORONARY ANGIOGRAPHY N/A 09/18/2017   Procedure: RIGHT/LEFT HEART CATH AND CORONARY ANGIOGRAPHY;  Surgeon: Wellington Hampshire, MD;  Location: [place] CV LAB;  Service: Cardiovascular;  Laterality: N/A;     Current Outpatient Medications  Medication Sig Dispense Refill   albuterol (VENTOLIN HFA) 108 (90 Base) MCG/ACT inhaler Inhale 2 puffs into the lungs every 6 (six) hours as needed for wheezing or shortness of breath. 8 g 6   buPROPion (WELLBUTRIN XL) 300 MG 24 hr tablet TAKE 1 TABLET BY MOUTH DAILY 90 tablet 0   carbidopa-levodopa (SINEMET IR) 25-250 MG  tablet Take 0.5 tablets by mouth 4 (four) times daily. 180 tablet 3   ELIQUIS 5 MG TABS tablet TAKE ONE TABLET BY MOUTH TWICE DAILY 180 tablet 1   entacapone (COMTAN) 200 MG tablet TAKE 1 TABLET BY MOUTH 3 TIMES DAILY 270 tablet 0   esomeprazole (NEXIUM) 40 MG capsule TAKE 1 CAPSULE BY MOUTH ONCE DAILY 30 capsule 2   feeding supplement (ENSURE ENLIVE / ENSURE PLUS) LIQD Take 237 mLs by mouth 3 (three) times daily between meals. 237 mL 12   fluticasone (FLONASE) 50 MCG/ACT nasal spray USE 2 PUFFS IN EACH NOSTRIL DAILY (Patient taking differently: Place 2 sprays into both nostrils daily.) 16 g 3   furosemide (LASIX) 40 MG tablet TAKE 1 TABLET BY MOUTH DAILY 30 tablet 0   hydrocortisone 2.5 % lotion For seborrheic dermatitis of the face apply to aa's QAM on Tuesday, Thursday, and Saturday. 59 mL 0   ketoconazole (NIZORAL) 2 % cream Apply a thin coat to face QAM on Monday, Wednesday, and Friday. 60 g 3   ketoconazole (NIZORAL) 2 % shampoo Shampoo  into the face and scalp let sit 5 minutes then wash off. Use 3d/wk. 120 mL 3   lactulose (CHRONULAC) 10 GM/15ML solution TAKE 30 MLS BY MOUTH TWICE DAILY AS NEEDED FOR MODERATE CONSTIPATION 236 mL 0   levothyroxine (SYNTHROID) 50 MCG tablet TAKE ONE TABLET ON AN EMPTY STOMACH WITHA GLASS OF WATER AT LEAST 30 TO 60 MINUTES BEFORE BREAKFAST (Patient taking differently: Take 50 mcg by mouth daily before breakfast.) 90 tablet 1   losartan (COZAAR) 100 MG tablet TAKE 1 TABLET BY MOUTH DAILY 90 tablet 1   metoprolol tartrate (LOPRESSOR) 25 MG tablet Take 1 tablet (25 mg total) by mouth 2 (two) times daily. 180 tablet 3   mirabegron ER (MYRBETRIQ) 25 MG TB24 tablet Take 1 tablet (25 mg total) by mouth daily. 30 tablet 11   rosuvastatin (CRESTOR) 5 MG tablet Take 1 tablet (5 mg total) by mouth daily. 90 tablet 3   sertraline (ZOLOFT) 50 MG tablet TAKE 1 TABLET BY MOUTH DAILY 30 tablet 3   STIOLTO RESPIMAT 2.5-2.5 MCG/ACT AERS SMARTSIG:2 Puff(s) Via Inhaler Daily      Pimavanserin Tartrate (NUPLAZID) 34 MG CAPS Take 1 capsule by mouth daily (Patient not taking: Reported on 10/26/2021) 30 capsule 5   QUEtiapine (SEROQUEL) 50 MG tablet Take 1 tablet (50 mg total) by mouth at bedtime. Complete 1 week of 25 mg dosing and then will increase to '50mg'$  tablet (Patient not taking: Reported on 10/26/2021) 30 tablet 5   No current facility-administered medications for this visit.    Allergies:   Pravastatin and Prednisone    Social History:  The patient  reports that he quit smoking about 49 years ago. His smoking use included cigarettes, pipe, and cigars. He has a 10.00 pack-year smoking history. He quit smokeless tobacco use about 49 years ago.  His smokeless tobacco use included chew. He reports current alcohol use of about 4.0 standard drinks of alcohol per week. He reports that he does not use drugs.   Family History:  The patient's family history includes Alcohol abuse in his father.    ROS:  Please see the history of present illness.   Otherwise, review of systems are positive for none.   All other systems are reviewed and negative.    PHYSICAL EXAM: VS:  BP 112/74 (BP Location: Left Arm, Patient Position: Sitting, Cuff Size: Normal)   Pulse 69   Ht '5\' 6"'$  (1.676 m)   Wt 212 lb 2 oz (96.2 kg)   SpO2 96%   BMI 34.24 kg/m  , BMI Body mass index is 34.24 kg/m. GEN: Well nourished, well developed, in no acute distress  HEENT: normal  Neck: no JVD, carotid bruits, or masses Cardiac: Irregularly irregular; no rubs, or gallops, trace bilateral leg edema.  2 /6 systolic murmur in the aortic area. Respiratory:  clear to auscultation bilaterally with mildly diminished breath sounds, normal work of breathing GI: soft, nontender, nondistended, + BS MS: no deformity or atrophy  Skin: warm and dry, no rash Neuro:  Strength and sensation are intact Psych: euthymic mood, full affect  EKG:  EKG is ordered today. The ekg ordered today demonstrates atrial fibrillation  with left axis deviation .  Ventricular rate is 69 bpm.    Recent Labs: 04/20/2021: B Natriuretic Peptide 433.9 05/19/2021: Magnesium 2.2 07/05/2021: BUN 13; Creatinine, Ser 1.01; Hemoglobin 16.6; Platelets 143; Potassium 3.7; Sodium 138 09/06/2021: ALT 7    Lipid Panel    Component Value Date/Time   CHOL 146  08/27/2017 0419   TRIG 134 08/27/2017 0419   HDL 43 08/27/2017 0419   CHOLHDL 3.4 08/27/2017 0419   VLDL 27 08/27/2017 0419   LDLCALC 76 08/27/2017 0419   LDLDIRECT 121.0 09/06/2021 1411      Wt Readings from Last 3 Encounters:  10/26/21 212 lb 2 oz (96.2 kg)  10/12/21 211 lb (95.7 kg)  09/15/21 215 lb (97.5 kg)           No data to display            ASSESSMENT AND PLAN:  1. Coronary artery disease involving native coronary arteries without angina: He is doing reasonably well from a cardiac standpoint with no anginal symptoms.  Continue medical therapy.  No antiplatelet medications given that he is on Eliquis.   2.  Permanent atrial fibrillation: Continue rate control with small dose metoprolol and anticoagulation with Eliquis.  I reviewed most recent labs done in March which showed normal hemoglobin.  His creatinine was 1.01.    3. Essential hypertension: Blood pressure is controlled on current medications.   4. Hyperlipidemia: He had myalgia with atorvastatin but he is tolerating rosuvastatin.  Most recent LDL was 76.   5.  Chronic diastolic heart failure: He appears to be euvolemic on current dose of furosemide 40 mg once daily.    Disposition:   FU with me in 6 months  Signed,  [name], MD  10/26/2021 2:34 PM    [place]

## 2021-10-27 ENCOUNTER — Encounter: Payer: Self-pay | Admitting: Internal Medicine

## 2021-10-29 ENCOUNTER — Encounter: Payer: Self-pay | Admitting: Internal Medicine

## 2021-10-29 DIAGNOSIS — G4710 Hypersomnia, unspecified: Secondary | ICD-10-CM | POA: Diagnosis not present

## 2021-10-29 DIAGNOSIS — R0681 Apnea, not elsewhere classified: Secondary | ICD-10-CM | POA: Diagnosis not present

## 2021-10-29 DIAGNOSIS — G4733 Obstructive sleep apnea (adult) (pediatric): Secondary | ICD-10-CM | POA: Diagnosis not present

## 2021-11-01 ENCOUNTER — Other Ambulatory Visit: Payer: PPO

## 2021-11-09 ENCOUNTER — Other Ambulatory Visit: Payer: Self-pay | Admitting: Cardiovascular Disease

## 2021-11-09 DIAGNOSIS — I4821 Permanent atrial fibrillation: Secondary | ICD-10-CM

## 2021-11-09 NOTE — Telephone Encounter (Signed)
Pt last saw Dr Fletcher Anon 10/26/21, last labs 07/05/21 Creat 1.01, age 86, weight 96.2kg, based on specified criteria pt is on appropriate dosage of Eliquis '5mg'$  BID for afib.  Will refill rx.

## 2021-11-09 NOTE — Telephone Encounter (Signed)
Refill request

## 2021-11-15 ENCOUNTER — Other Ambulatory Visit: Payer: Self-pay | Admitting: Family Medicine

## 2021-11-15 DIAGNOSIS — I959 Hypotension, unspecified: Secondary | ICD-10-CM

## 2021-11-16 ENCOUNTER — Telehealth: Payer: Self-pay | Admitting: Neurology

## 2021-11-16 NOTE — Telephone Encounter (Signed)
Ok with me, prior Dr. Jannifer Franklin patient.

## 2021-11-16 NOTE — Telephone Encounter (Signed)
LVM and sent MyChart message informing pt that provider switch was approved.

## 2021-11-16 NOTE — Telephone Encounter (Signed)
Pt is requesting to switch providers from Dr. Rexene Alberts to Dr. Leta Baptist (parkinson's, dementia). He states that he wants to have the same provider as his wife and that he is comfortable with Dr. Leta Baptist. Please advise.

## 2021-11-26 ENCOUNTER — Other Ambulatory Visit: Payer: Self-pay | Admitting: Family Medicine

## 2021-11-26 DIAGNOSIS — F419 Anxiety disorder, unspecified: Secondary | ICD-10-CM

## 2021-11-29 DIAGNOSIS — R0681 Apnea, not elsewhere classified: Secondary | ICD-10-CM | POA: Diagnosis not present

## 2021-11-29 DIAGNOSIS — G4733 Obstructive sleep apnea (adult) (pediatric): Secondary | ICD-10-CM | POA: Diagnosis not present

## 2021-11-29 DIAGNOSIS — G4710 Hypersomnia, unspecified: Secondary | ICD-10-CM | POA: Diagnosis not present

## 2021-12-07 ENCOUNTER — Ambulatory Visit: Payer: HMO | Admitting: Neurology

## 2021-12-09 ENCOUNTER — Telehealth: Payer: Self-pay

## 2021-12-09 NOTE — Telephone Encounter (Signed)
[name] left a voicemail stating that she would like more samples of Myrbetriq for the patient. The medication is expensive and hard for them to afford.

## 2021-12-10 NOTE — Telephone Encounter (Signed)
Okay to give samples x1 month - 25 mg

## 2021-12-10 NOTE — Telephone Encounter (Signed)
LMOM informed patient medication is up front ready for pickup.

## 2021-12-14 ENCOUNTER — Telehealth: Payer: Self-pay | Admitting: Diagnostic Neuroimaging

## 2021-12-14 ENCOUNTER — Ambulatory Visit: Payer: HMO | Admitting: Diagnostic Neuroimaging

## 2021-12-14 NOTE — Telephone Encounter (Signed)
Pt wife [name]) is calling and stated husband went to bed around 11:00 pm last night. [name] said this is not like Pt to not get up before 11:00 am in the morning. [name] wanted to talk to a nurse. I suggest to her to call 911 just to have Pt checked out to make sure he is okay. [name] said she would call and have him checked out

## 2021-12-14 NOTE — Telephone Encounter (Signed)
At 4:21 pt's wife left vm stating she has a very important concern she would like to discuss with Dr re: pt. She is asking to be called back at [phone]

## 2021-12-15 NOTE — Telephone Encounter (Signed)
Called wife and reviewed NP's note in detail. She stated she will call PCP, stated Dr Guadelupe Sabin name is on carb/levo bottle. I advised again she use Dr Guadelupe Sabin directions 1/2 tab 4 x daily. Wife verbalized understanding, appreciation.

## 2021-12-15 NOTE — Telephone Encounter (Signed)
If this was just a one-time occurrence and he does not have any worsening confusion or new neurological symptoms, would recommend continuing to monitor for now. Question if maybe he didn't sleep too that night causing him to sleep longer during the day. If he does have any worsening confusion or new neurological symptoms, would advise to proceed to emergency room for more emergent evaluation.  They can also schedule follow-up visit with PCP to rule out any type of underlying infection or electrolyte disturbance contributing to symptoms.  I am not sure about his prescriptions, prescription by Dr. Rexene Alberts  back in January for 0.5 tab QID, would advise her to ensure the current prescription was last filled by Dr. Rexene Alberts (to ensure no other doctor filled with incorrect amount). Would recommend taking as previously recommended by Dr. Rexene Alberts.

## 2021-12-15 NOTE — Telephone Encounter (Signed)
LVM requesting call back.

## 2021-12-15 NOTE — Telephone Encounter (Signed)
Wife returned called, stated husband slept till 4 pm yesterday. She didn't call 911. He normally sleeps from 10-11 pm till 1-2 pm. His night meds are Sinemet, entacapone, eliquis, metoprolol, crestor, nexium. She gives him Sinemet 1 tab 4 x daily, but is lucky to get him to take it twice a day due to sleeping so much.  I advised her of Dr Guadelupe Sabin new Rx in Jan, 1/2 tab 4 x daily. Her bottle states 1 tab 4 x daily. She is concerned about his sleepiness, if meds are causing this. I advised will send to NP and call her back. She  Patient verbalized understanding, appreciation.

## 2021-12-28 ENCOUNTER — Encounter: Payer: Self-pay | Admitting: Internal Medicine

## 2021-12-30 DIAGNOSIS — G4710 Hypersomnia, unspecified: Secondary | ICD-10-CM | POA: Diagnosis not present

## 2021-12-30 DIAGNOSIS — R0681 Apnea, not elsewhere classified: Secondary | ICD-10-CM | POA: Diagnosis not present

## 2021-12-30 DIAGNOSIS — G4733 Obstructive sleep apnea (adult) (pediatric): Secondary | ICD-10-CM | POA: Diagnosis not present

## 2022-01-03 ENCOUNTER — Ambulatory Visit: Payer: HMO | Admitting: Internal Medicine

## 2022-01-04 ENCOUNTER — Ambulatory Visit: Payer: Self-pay | Admitting: Internal Medicine

## 2022-01-05 ENCOUNTER — Ambulatory Visit: Payer: HMO | Admitting: Dermatology

## 2022-01-05 DIAGNOSIS — L578 Other skin changes due to chronic exposure to nonionizing radiation: Secondary | ICD-10-CM

## 2022-01-05 DIAGNOSIS — D229 Melanocytic nevi, unspecified: Secondary | ICD-10-CM

## 2022-01-05 DIAGNOSIS — Z1283 Encounter for screening for malignant neoplasm of skin: Secondary | ICD-10-CM

## 2022-01-05 DIAGNOSIS — D1801 Hemangioma of skin and subcutaneous tissue: Secondary | ICD-10-CM

## 2022-01-05 DIAGNOSIS — L814 Other melanin hyperpigmentation: Secondary | ICD-10-CM

## 2022-01-05 DIAGNOSIS — Z85828 Personal history of other malignant neoplasm of skin: Secondary | ICD-10-CM | POA: Diagnosis not present

## 2022-01-05 DIAGNOSIS — L570 Actinic keratosis: Secondary | ICD-10-CM

## 2022-01-05 DIAGNOSIS — L821 Other seborrheic keratosis: Secondary | ICD-10-CM

## 2022-01-05 DIAGNOSIS — L820 Inflamed seborrheic keratosis: Secondary | ICD-10-CM

## 2022-01-05 NOTE — Progress Notes (Unsigned)
Follow-Up Visit   Subjective  Andre [name] is a 86 y.o. male who presents for the following: Follow-up (The patient presents for Upper Body Skin Exam (UBSE) for skin cancer screening and mole check.  The patient has spots, moles and lesions to be evaluated, some may be new or changing and the patient has concerns that these could be cancer. ). Hx of SCC, hx of BCC  The following portions of the chart were reviewed this encounter and updated as appropriate:   Tobacco  Allergies  Meds  Problems  Med Hx  Surg Hx  Fam Hx     Review of Systems:  No other skin or systemic complaints except as noted in HPI or Assessment and Plan.  Objective  Well appearing patient in no apparent distress; mood and affect are within normal limits.  All skin waist up examined. And legs   scalp,face,ears x 20 (20) Erythematous thin papules/macules with gritty scale.   Scalp, face x 10 (10) Stuck-on, waxy, tan-brown papules-- Discussed benign etiology and prognosis.    Assessment & Plan  AK (actinic keratosis) (20) scalp,face,ears x 20  Actinic keratoses are precancerous spots that appear secondary to cumulative UV radiation exposure/sun exposure over time. They are chronic with expected duration over 1 year. A portion of actinic keratoses will progress to squamous cell carcinoma of the skin. It is not possible to reliably predict which spots will progress to skin cancer and so treatment is recommended to prevent development of skin cancer.  Recommend daily broad spectrum sunscreen SPF 30+ to sun-exposed areas, reapply every 2 hours as needed.  Recommend staying in the shade or wearing long sleeves, sun glasses (UVA+UVB protection) and wide brim hats (4-inch brim around the entire circumference of the hat). Call for new or changing lesions.   Destruction of lesion - scalp,face,ears x 20 Complexity: simple   Destruction method: cryotherapy   Informed consent: discussed and consent obtained    Timeout:  patient name, date of birth, surgical site, and procedure verified Lesion destroyed using liquid nitrogen: Yes   Region frozen until ice ball extended beyond lesion: Yes   Outcome: patient tolerated procedure well with no complications   Post-procedure details: wound care instructions given    Inflamed seborrheic keratosis (10) Scalp, face x 10  Symptomatic, irritating, patient would like treated.   Destruction of lesion - Scalp, face x 10 Complexity: simple   Destruction method: cryotherapy   Informed consent: discussed and consent obtained   Timeout:  patient name, date of birth, surgical site, and procedure verified Lesion destroyed using liquid nitrogen: Yes   Region frozen until ice ball extended beyond lesion: Yes   Outcome: patient tolerated procedure well with no complications   Post-procedure details: wound care instructions given    Lentigines - Scattered tan macules - Due to sun exposure - Benign-appearing, observe - Recommend daily broad spectrum sunscreen SPF 30+ to sun-exposed areas, reapply every 2 hours as needed. - Call for any changes  Seborrheic Keratoses - Stuck-on, waxy, tan-brown papules and/or plaques  - Benign-appearing - Discussed benign etiology and prognosis. - Observe - Call for any changes  Melanocytic Nevi - Tan-brown and/or pink-flesh-colored symmetric macules and papules - Benign appearing on exam today - Observation - Call clinic for new or changing moles - Recommend daily use of broad spectrum spf 30+ sunscreen to sun-exposed areas.   Hemangiomas - Red papules - Discussed benign nature - Observe - Call for any changes  Actinic Damage -  Chronic condition, secondary to cumulative UV/sun exposure - diffuse scaly erythematous macules with underlying dyspigmentation - Recommend daily broad spectrum sunscreen SPF 30+ to sun-exposed areas, reapply every 2 hours as needed.  - Staying in the shade or wearing long sleeves, sun  glasses (UVA+UVB protection) and wide brim hats (4-inch brim around the entire circumference of the hat) are also recommended for sun protection.  - Call for new or changing lesions.  History of Basal Cell Carcinoma of the Skin Right nose supratip - No evidence of recurrence today - Recommend regular full body skin exams - Recommend daily broad spectrum sunscreen SPF 30+ to sun-exposed areas, reapply every 2 hours as needed.  - Call if any new or changing lesions are noted between office visits   History of Squamous Cell Carcinoma of the Skin Right lateral crown - No evidence of recurrence today - No lymphadenopathy - Recommend regular full body skin exams - Recommend daily broad spectrum sunscreen SPF 30+ to sun-exposed areas, reapply every 2 hours as needed.  - Call if any new or changing lesions are noted between office visits  Skin cancer screening performed today.   Return in about 6 months (around 07/06/2022) for UBSE hx of Skin cancar .  IMarye Round, CMA, am acting as scribe for Sarina Ser, MD .  Documentation: I have reviewed the above documentation for accuracy and completeness, and I agree with the above.  Sarina Ser, MD

## 2022-01-05 NOTE — Patient Instructions (Addendum)
Cryotherapy Aftercare  Wash gently with soap and water everyday.   Apply Vaseline and Band-Aid daily until healed.     Due to recent changes in healthcare laws, you may see results of your pathology and/or laboratory studies on MyChart before the doctors have had a chance to review them. We understand that in some cases there may be results that are confusing or concerning to you. Please understand that not all results are received at the same time and often the doctors may need to interpret multiple results in order to provide you with the best plan of care or course of treatment. Therefore, we ask that you please give us 2 business days to thoroughly review all your results before contacting the office for clarification. Should we see a critical lab result, you will be contacted sooner.   If You Need Anything After Your Visit  If you have any questions or concerns for your doctor, please call our main line at [phone] and press option 4 to reach your doctor's medical assistant. If no one answers, please leave a voicemail as directed and we will return your call as soon as possible. Messages left after 4 pm will be answered the following business day.   You may also send us a message via MyChart. We typically respond to MyChart messages within 1-2 business days.  For prescription refills, please ask your pharmacy to contact our office. Our fax number is [phone].  If you have an urgent issue when the clinic is closed that cannot wait until the next business day, you can page your doctor at the number below.    Please note that while we do our best to be available for urgent issues outside of office hours, we are not available 24/7.   If you have an urgent issue and are unable to reach us, you may choose to seek medical care at your doctor's office, retail clinic, urgent care center, or emergency room.  If you have a medical emergency, please immediately call 911 or go to the  emergency department.  Pager Numbers  - Dr. Kowalski: [phone]  - Dr. Moye: [phone]  - Dr. Stewart: [phone]  In the event of inclement weather, please call our main line at [phone] for an update on the status of any delays or closures.  Dermatology Medication Tips: Please keep the boxes that topical medications come in in order to help keep track of the instructions about where and how to use these. Pharmacies typically print the medication instructions only on the boxes and not directly on the medication tubes.   If your medication is too expensive, please contact our office at [phone] option 4 or send us a message through MyChart.   We are unable to tell what your co-pay for medications will be in advance as this is different depending on your insurance coverage. However, we may be able to find a substitute medication at lower cost or fill out paperwork to get insurance to cover a needed medication.   If a prior authorization is required to get your medication covered by your insurance company, please allow us 1-2 business days to complete this process.  Drug prices often vary depending on where the prescription is filled and some pharmacies may offer cheaper prices.  The website www.goodrx.com contains coupons for medications through different pharmacies. The prices here do not account for what the cost may be with help from insurance (it may be cheaper with your insurance), but the website can   give you the price if you did not use any insurance.  - You can print the associated coupon and take it with your prescription to the pharmacy.  - You may also stop by our office during regular business hours and pick up a GoodRx coupon card.  - If you need your prescription sent electronically to a different pharmacy, notify our office through [facility] MyChart or by phone at [phone] option 4.     Si Usted Necesita Algo Despus de Su Visita  Tambin puede  enviarnos un mensaje a travs de MyChart. Por lo general respondemos a los mensajes de MyChart en el transcurso de 1 a 2 das hbiles.  Para renovar recetas, por favor pida a su farmacia que se ponga en contacto con nuestra oficina. Nuestro nmero de fax es el [phone].  Si tiene un asunto urgente cuando la clnica est cerrada y que no puede esperar hasta el siguiente da hbil, puede llamar/localizar a su doctor(a) al nmero que aparece a continuacin.   Por favor, tenga en cuenta que aunque hacemos todo lo posible para estar disponibles para asuntos urgentes fuera del horario de oficina, no estamos disponibles las 24 horas del da, los 7 das de la semana.   Si tiene un problema urgente y no puede comunicarse con nosotros, puede optar por buscar atencin mdica  en el consultorio de su doctor(a), en una clnica privada, en un centro de atencin urgente o en una sala de emergencias.  Si tiene una emergencia mdica, por favor llame inmediatamente al 911 o vaya a la sala de emergencias.  Nmeros de bper  - Dr. Kowalski: [phone]  - Dra. Moye: [phone]  - Dra. Stewart: [phone]  En caso de inclemencias del tiempo, por favor llame a nuestra lnea principal al [phone] para una actualizacin sobre el estado de cualquier retraso o cierre.  Consejos para la medicacin en dermatologa: Por favor, guarde las cajas en las que vienen los medicamentos de uso tpico para ayudarle a seguir las instrucciones sobre dnde y cmo usarlos. Las farmacias generalmente imprimen las instrucciones del medicamento slo en las cajas y no directamente en los tubos del medicamento.   Si su medicamento es muy caro, por favor, pngase en contacto con nuestra oficina llamando al [phone] y presione la opcin 4 o envenos un mensaje a travs de MyChart.   No podemos decirle cul ser su copago por los medicamentos por adelantado ya que esto es diferente dependiendo de la cobertura de su seguro.  Sin embargo, es posible que podamos encontrar un medicamento sustituto a menor costo o llenar un formulario para que el seguro cubra el medicamento que se considera necesario.   Si se requiere una autorizacin previa para que su compaa de seguros cubra su medicamento, por favor permtanos de 1 a 2 das hbiles para completar este proceso.  Los precios de los medicamentos varan con frecuencia dependiendo del lugar de dnde se surte la receta y alguna farmacias pueden ofrecer precios ms baratos.  El sitio web www.goodrx.com tiene cupones para medicamentos de diferentes farmacias. Los precios aqu no tienen en cuenta lo que podra costar con la ayuda del seguro (puede ser ms barato con su seguro), pero el sitio web puede darle el precio si no utiliz ningn seguro.  - Puede imprimir el cupn correspondiente y llevarlo con su receta a la farmacia.  - Tambin puede pasar por nuestra oficina durante el horario de atencin regular y recoger una tarjeta de cupones de GoodRx.  -   Si necesita que su receta se enve electrnicamente a una farmacia diferente, informe a nuestra oficina a travs de MyChart de [facility] o por telfono llamando al [phone] y presione la opcin 4.  

## 2022-01-06 ENCOUNTER — Encounter: Payer: Self-pay | Admitting: Dermatology

## 2022-01-06 ENCOUNTER — Other Ambulatory Visit: Payer: Self-pay | Admitting: Adult Health

## 2022-01-10 ENCOUNTER — Telehealth: Payer: Self-pay | Admitting: Urology

## 2022-01-18 ENCOUNTER — Encounter: Payer: Self-pay | Admitting: Family Medicine

## 2022-01-18 ENCOUNTER — Ambulatory Visit (INDEPENDENT_AMBULATORY_CARE_PROVIDER_SITE_OTHER): Payer: HMO | Admitting: Family Medicine

## 2022-01-18 VITALS — BP 130/70 | HR 66 | Temp 98.6°F | Ht 66.0 in | Wt 215.2 lb

## 2022-01-18 DIAGNOSIS — Z23 Encounter for immunization: Secondary | ICD-10-CM | POA: Diagnosis not present

## 2022-01-18 DIAGNOSIS — E785 Hyperlipidemia, unspecified: Secondary | ICD-10-CM | POA: Diagnosis not present

## 2022-01-18 DIAGNOSIS — E039 Hypothyroidism, unspecified: Secondary | ICD-10-CM

## 2022-01-18 DIAGNOSIS — G20A1 Parkinson's disease without dyskinesia, without mention of fluctuations: Secondary | ICD-10-CM | POA: Diagnosis not present

## 2022-01-18 LAB — HEPATIC FUNCTION PANEL
ALT: 10 U/L (ref 0–53)
AST: 12 U/L (ref 0–37)
Albumin: 4 g/dL (ref 3.5–5.2)
Alkaline Phosphatase: 83 U/L (ref 39–117)
Bilirubin, Direct: 0.3 mg/dL (ref 0.0–0.3)
Total Bilirubin: 1.5 mg/dL — ABNORMAL HIGH (ref 0.2–1.2)
Total Protein: 6.6 g/dL (ref 6.0–8.3)

## 2022-01-18 LAB — LDL CHOLESTEROL, DIRECT: Direct LDL: 99 mg/dL

## 2022-01-18 LAB — TSH: TSH: 6.51 u[IU]/mL — ABNORMAL HIGH (ref 0.35–5.50)

## 2022-01-18 NOTE — Progress Notes (Signed)
[name], MD Phone: [phone]  Andre [name] is a 86 y.o. male who presents today for follow-up.  Parkinson's: Patient continues to have dizziness related to his Parkinson's.  He does sleep a lot.  His wife contacted neurology and wondered if his Sinemet was contributing to that.  She was previously giving him 1 tablet 4 times daily though often times he only take this twice a day given that when she was sleeping.  His prescription for Sinemet was written for half a tablet 4 times daily.  He notes his balance is a little worse.  He continues on Comtan and Sinemet.  Hypothyroidism: Taking Synthroid.  No skin changes.  No heat or cold intolerance.  Does report some thin skin that is easy to be torn if he bumps into something.  Does heal up adequately.  Notes this is nothing new.  Social History   Tobacco Use  Smoking Status Former   Packs/day: 1.00   Years: 10.00   Total pack years: 10.00   Types: Cigarettes, Pipe, Cigars   Quit date: 04/18/1972   Years since quitting: 49.7  Smokeless Tobacco Former   Types: Chew   Quit date: 04/18/1972    Current Outpatient Medications on File Prior to Visit  Medication Sig Dispense Refill   albuterol (VENTOLIN HFA) 108 (90 Base) MCG/ACT inhaler Inhale 2 puffs into the lungs every 6 (six) hours as needed for wheezing or shortness of breath. 8 g 6   buPROPion (WELLBUTRIN XL) 300 MG 24 hr tablet TAKE 1 TABLET BY MOUTH DAILY 90 tablet 0   carbidopa-levodopa (SINEMET IR) 25-250 MG tablet Take 0.5 tablets by mouth 4 (four) times daily. 180 tablet 3   ELIQUIS 5 MG TABS tablet TAKE ONE TABLET BY MOUTH TWICE DAILY 180 tablet 1   entacapone (COMTAN) 200 MG tablet TAKE 1 TABLET BY MOUTH 3 TIMES DAILY 270 tablet 0   esomeprazole (NEXIUM) 40 MG capsule TAKE 1 CAPSULE BY MOUTH ONCE DAILY 30 capsule 2   feeding supplement (ENSURE ENLIVE / ENSURE PLUS) LIQD Take 237 mLs by mouth 3 (three) times daily between meals. 237 mL 12   fluticasone (FLONASE) 50  MCG/ACT nasal spray USE 2 PUFFS IN EACH NOSTRIL DAILY (Patient taking differently: Place 2 sprays into both nostrils daily.) 16 g 3   furosemide (LASIX) 40 MG tablet TAKE 1 TABLET BY MOUTH DAILY 90 tablet 1   hydrocortisone 2.5 % lotion For seborrheic dermatitis of the face apply to aa's QAM on Tuesday, Thursday, and Saturday. 59 mL 0   ketoconazole (NIZORAL) 2 % cream Apply a thin coat to face QAM on Monday, Wednesday, and Friday. 60 g 3   ketoconazole (NIZORAL) 2 % shampoo Shampoo into the face and scalp let sit 5 minutes then wash off. Use 3d/wk. 120 mL 3   lactulose (CHRONULAC) 10 GM/15ML solution TAKE 30 MLS BY MOUTH TWICE DAILY AS NEEDED FOR MODERATE CONSTIPATION 236 mL 0   levothyroxine (SYNTHROID) 50 MCG tablet TAKE ONE TABLET ON AN EMPTY STOMACH WITHA GLASS OF WATER AT LEAST 30 TO 60 MINUTES BEFORE BREAKFAST (Patient taking differently: Take 50 mcg by mouth daily before breakfast.) 90 tablet 1   losartan (COZAAR) 100 MG tablet TAKE 1 TABLET BY MOUTH DAILY 90 tablet 1   metoprolol tartrate (LOPRESSOR) 25 MG tablet Take 1 tablet (25 mg total) by mouth 2 (two) times daily. 180 tablet 3   mirabegron ER (MYRBETRIQ) 25 MG TB24 tablet Take 1 tablet (25 mg total) by mouth  daily. 30 tablet 11   Pimavanserin Tartrate (NUPLAZID) 34 MG CAPS Take 1 capsule by mouth daily 30 capsule 5   rosuvastatin (CRESTOR) 5 MG tablet Take 1 tablet (5 mg total) by mouth daily. 90 tablet 3   sertraline (ZOLOFT) 50 MG tablet TAKE 1 TABLET BY MOUTH DAILY 30 tablet 3   STIOLTO RESPIMAT 2.5-2.5 MCG/ACT AERS SMARTSIG:2 Puff(s) Via Inhaler Daily     No current facility-administered medications on file prior to visit.     ROS see history of present illness  Objective  Physical Exam Vitals:   01/18/22 1228  BP: 130/70  Pulse: 66  Temp: 98.6 F (37 C)  SpO2: 94%    BP Readings from Last 3 Encounters:  01/18/22 130/70  10/26/21 112/74  10/12/21 120/80   Wt Readings from Last 3 Encounters:  01/18/22 215 lb  3.2 oz (97.6 kg)  10/26/21 212 lb 2 oz (96.2 kg)  10/12/21 211 lb (95.7 kg)    Physical Exam Constitutional:      General: He is not in acute distress.    Appearance: He is not diaphoretic.  Cardiovascular:     Rate and Rhythm: Normal rate and regular rhythm.     Heart sounds: Normal heart sounds.  Pulmonary:     Effort: Pulmonary effort is normal.     Breath sounds: Normal breath sounds.  Skin:    General: Skin is warm and dry.  Neurological:     Mental Status: He is alert.      Assessment/Plan: Please see individual problem list.  Problem List Items Addressed This Visit     Hyperlipidemia with target LDL less than 70 (Chronic)   Relevant Orders   Hepatic function panel   Direct LDL   Hypothyroidism - Primary (Chronic)    Check TSH.  Continue Synthroid 50 mcg once daily.      Relevant Orders   TSH   Parkinson's disease (Chronic)    His sleeping a lot is likely related to his Parkinson's though his Parkinson's medications potentially could be playing a role.  Discussed that his Sinemet dose is supposed to be half a tablet 4 times a day and they will start giving him that.  He will continue to follow with neurology.      Other Visit Diagnoses     Need for immunization against influenza       Relevant Orders   Flu Vaccine QUAD High Dose(Fluad) (Completed)      Skin tears are likely just related to his thin skin.  Discussed avoiding bumping into things.   Return in about 3 months (around 04/20/2022).   [name], MD [place]

## 2022-01-18 NOTE — Assessment & Plan Note (Signed)
Check TSH.  Continue Synthroid 50 mcg once daily. 

## 2022-01-18 NOTE — Assessment & Plan Note (Signed)
His sleeping a lot is likely related to his Parkinson's though his Parkinson's medications potentially could be playing a role.  Discussed that his Sinemet dose is supposed to be half a tablet 4 times a day and they will start giving him that.  He will continue to follow with neurology.

## 2022-01-18 NOTE — Patient Instructions (Addendum)
Nice to see you. We will get lab work today and contact you with the results. Please make sure you are taking the Sinemet half a tablet 4 times daily.

## 2022-01-20 ENCOUNTER — Telehealth: Payer: Self-pay

## 2022-01-20 NOTE — Telephone Encounter (Signed)
I called and spoke with the patients wife and she gave me a name of a medication the patient is taking for hallucinations, she stated it was $500. Its called Nuplazid.  Shelbey Spindler,cma

## 2022-01-21 NOTE — Telephone Encounter (Signed)
Noted.  Removed from medication list.

## 2022-01-24 ENCOUNTER — Other Ambulatory Visit: Payer: Self-pay | Admitting: Family Medicine

## 2022-01-24 ENCOUNTER — Encounter: Payer: Self-pay | Admitting: Diagnostic Neuroimaging

## 2022-01-24 ENCOUNTER — Ambulatory Visit: Payer: HMO | Admitting: Diagnostic Neuroimaging

## 2022-01-24 VITALS — BP 141/73 | HR 63 | Ht 67.0 in | Wt 218.4 lb

## 2022-01-24 DIAGNOSIS — G20A1 Parkinson's disease without dyskinesia, without mention of fluctuations: Secondary | ICD-10-CM | POA: Diagnosis not present

## 2022-01-24 DIAGNOSIS — E039 Hypothyroidism, unspecified: Secondary | ICD-10-CM

## 2022-01-24 MED ORDER — CARBIDOPA-LEVODOPA 25-250 MG PO TABS: 0.5000 | ORAL_TABLET | Freq: Four times a day (QID) | ORAL | 4 refills | Status: DC

## 2022-01-24 MED ORDER — ENTACAPONE 200 MG PO TABS: 200.0000 mg | ORAL_TABLET | Freq: Three times a day (TID) | ORAL | 4 refills | Status: DC
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
## 2022-01-26 ENCOUNTER — Telehealth: Payer: Self-pay | Admitting: Family Medicine

## 2022-01-26 DIAGNOSIS — E785 Hyperlipidemia, unspecified: Secondary | ICD-10-CM

## 2022-01-26 DIAGNOSIS — Z961 Presence of intraocular lens: Secondary | ICD-10-CM | POA: Diagnosis not present

## 2022-01-26 DIAGNOSIS — E039 Hypothyroidism, unspecified: Secondary | ICD-10-CM

## 2022-01-26 MED ORDER — ROSUVASTATIN CALCIUM 10 MG PO TABS: 10.0000 mg | ORAL_TABLET | Freq: Every day | ORAL | 1 refills | Status: DC

## 2022-01-26 NOTE — Telephone Encounter (Signed)
This patient stated they would like a increase in the Crestor after thinking about it.  You can send it to their pharmacy. Tonilynn Bieker,cma

## 2022-01-26 NOTE — Telephone Encounter (Signed)
Sent to pharmacy. Patient needs labs in 6 weeks. Please get this scheduled. Orders placed.

## 2022-01-26 NOTE — Telephone Encounter (Signed)
[name] called to speak to Gae Bon about patient's rosuvastatin (CRESTOR) 5 MG tablet, patient would like to increase medication. Also, Should patient be taking any losartan (COZAAR) 100 MG tablet .

## 2022-01-26 NOTE — Telephone Encounter (Signed)
I called the patients wife and informed her that yes the patient should be taking the Losartan it was a BP medication and she understood, I also informed her that the increase int he Crestor was sent to her pharmacy and she did schedule a lab appointment for a recheck in 6 weeks while we were speaking.  Jona Zappone,cma

## 2022-01-26 NOTE — Telephone Encounter (Signed)
LVM for patient to call back.    To inform the wife that yes the patient should be taking the losartan it is for his BP.  I will inform the provider that they would also like the increase in the Crestor and the patient needs a appointment I  6 weeks to recheck his thyroid if he does not already have an appointment.  Lillyona Polasek,cma

## 2022-02-01 ENCOUNTER — Telehealth: Payer: Self-pay | Admitting: Family Medicine

## 2022-02-01 DIAGNOSIS — K219 Gastro-esophageal reflux disease without esophagitis: Secondary | ICD-10-CM

## 2022-02-03 ENCOUNTER — Other Ambulatory Visit: Payer: Self-pay

## 2022-02-03 DIAGNOSIS — E039 Hypothyroidism, unspecified: Secondary | ICD-10-CM

## 2022-02-03 MED ORDER — LEVOTHYROXINE SODIUM 50 MCG PO TABS: 50.0000 ug | ORAL_TABLET | Freq: Every day | ORAL | 1 refills | Status: DC

## 2022-02-03 MED ORDER — ESOMEPRAZOLE MAGNESIUM 40 MG PO CPDR: 40.0000 mg | DELAYED_RELEASE_CAPSULE | Freq: Every day | ORAL | 1 refills | Status: DC

## 2022-02-03 NOTE — Addendum Note (Signed)
Addended by: [name] D on: 02/03/2022 11:00 AM   Modules accepted: Orders

## 2022-02-03 NOTE — Telephone Encounter (Signed)
Medication sent to pharmacy.  Andre [name],cma  ?

## 2022-02-03 NOTE — Telephone Encounter (Signed)
Pt need refill on esomeprazole [facility] 

## 2022-02-04 ENCOUNTER — Inpatient Hospital Stay: Payer: HMO | Attending: Internal Medicine | Admitting: Internal Medicine

## 2022-02-04 ENCOUNTER — Other Ambulatory Visit: Payer: Self-pay

## 2022-02-04 ENCOUNTER — Encounter: Payer: Self-pay | Admitting: Internal Medicine

## 2022-02-04 DIAGNOSIS — Z7901 Long term (current) use of anticoagulants: Secondary | ICD-10-CM | POA: Diagnosis not present

## 2022-02-04 DIAGNOSIS — Z79899 Other long term (current) drug therapy: Secondary | ICD-10-CM | POA: Diagnosis not present

## 2022-02-04 DIAGNOSIS — D696 Thrombocytopenia, unspecified: Secondary | ICD-10-CM

## 2022-02-04 DIAGNOSIS — I2510 Atherosclerotic heart disease of native coronary artery without angina pectoris: Secondary | ICD-10-CM | POA: Diagnosis not present

## 2022-02-04 DIAGNOSIS — D751 Secondary polycythemia: Secondary | ICD-10-CM | POA: Diagnosis not present

## 2022-02-04 DIAGNOSIS — J8410 Pulmonary fibrosis, unspecified: Secondary | ICD-10-CM | POA: Insufficient documentation

## 2022-02-04 DIAGNOSIS — I480 Paroxysmal atrial fibrillation: Secondary | ICD-10-CM | POA: Diagnosis not present

## 2022-02-04 DIAGNOSIS — G20A1 Parkinson's disease without dyskinesia, without mention of fluctuations: Secondary | ICD-10-CM | POA: Diagnosis not present

## 2022-02-04 LAB — CBC WITH DIFFERENTIAL/PLATELET
Abs Immature Granulocytes: 0.03 10*3/uL (ref 0.00–0.07)
Basophils Absolute: 0.1 10*3/uL (ref 0.0–0.1)
Basophils Relative: 1 %
Eosinophils Absolute: 0.1 10*3/uL (ref 0.0–0.5)
Eosinophils Relative: 1 %
HCT: 49.4 % (ref 39.0–52.0)
Hemoglobin: 15.9 g/dL (ref 13.0–17.0)
Immature Granulocytes: 0 %
Lymphocytes Relative: 18 %
Lymphs Abs: 1.8 10*3/uL (ref 0.7–4.0)
MCH: 30.5 pg (ref 26.0–34.0)
MCHC: 32.2 g/dL (ref 30.0–36.0)
MCV: 94.8 fL (ref 80.0–100.0)
Monocytes Absolute: 0.8 10*3/uL (ref 0.1–1.0)
Monocytes Relative: 8 %
Neutro Abs: 7.5 10*3/uL (ref 1.7–7.7)
Neutrophils Relative %: 72 %
Platelets: 140 10*3/uL — ABNORMAL LOW (ref 150–400)
RBC: 5.21 MIL/uL (ref 4.22–5.81)
RDW: 13.9 % (ref 11.5–15.5)
Smear Review: NORMAL
WBC: 10.2 10*3/uL (ref 4.0–10.5)
nRBC: 0 % (ref 0.0–0.2)

## 2022-02-04 LAB — COMPREHENSIVE METABOLIC PANEL
ALT: 11 U/L (ref 0–44)
AST: 18 U/L (ref 15–41)
Albumin: 3.8 g/dL (ref 3.5–5.0)
Alkaline Phosphatase: 81 U/L (ref 38–126)
Anion gap: 8 (ref 5–15)
BUN: 20 mg/dL (ref 8–23)
CO2: 26 mmol/L (ref 22–32)
Calcium: 8.8 mg/dL — ABNORMAL LOW (ref 8.9–10.3)
Chloride: 105 mmol/L (ref 98–111)
Creatinine, Ser: 0.93 mg/dL (ref 0.61–1.24)
GFR, Estimated: >60 mL/min (ref >60)
Glucose, Bld: 101 mg/dL — ABNORMAL HIGH (ref 70–99)
Potassium: 4.2 mmol/L (ref 3.5–5.1)
Sodium: 139 mmol/L (ref 135–145)
Total Bilirubin: 1.2 mg/dL (ref 0.3–1.2)
Total Protein: 7.3 g/dL (ref 6.5–8.1)

## 2022-02-04 NOTE — Progress Notes (Unsigned)
[facility] OFFICE PROGRESS NOTE  Patient Care Team: [name], MD as PCP - General (Family Medicine) Wellington Hampshire, MD as PCP - Cardiology (Cardiology)   SUMMARY OF ONCOLOGIC HISTORY: # Mild Thrombocytopenia- 120-130s. ? Alcohol/fatty liver/? 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 [facility]]; chronic CHF; Pulmonary fiborsis- on TKI.  INTERVAL HISTORY: Ambulating independently.  Accompanied by his wife.  86 year old male patient with above history of secondary erythrocytosis from prior testosterone therapy/CPAP; intermittent thrombocytopenia and history of A. fib on Elquis here for follow-up.  Patient continues to have gait instability likely from his Parkinson's.  No recent fall.  Complaints Of ongoing fatigue.  He continues to follow-up with pulmonary for his interstitial lung disease.  He denies any new worsening shortness of breath or cough.  Review of Systems  Constitutional:  Negative for chills, diaphoresis, fever, malaise/fatigue and weight loss.  HENT:  Negative for nosebleeds and sore throat.   Eyes:  Negative for double vision.  Respiratory:  Negative for cough, hemoptysis, sputum production, shortness of breath and wheezing.   Cardiovascular:  Negative for chest pain, palpitations, orthopnea and leg swelling.  Gastrointestinal:  Negative for abdominal pain, blood in stool, constipation, diarrhea, heartburn, melena, nausea and vomiting.  Genitourinary:  Negative for dysuria, frequency and urgency.  Musculoskeletal:  Positive for back pain and joint pain.  Skin: Negative.  Negative for itching and rash.  Neurological:  Negative for dizziness, tingling, focal  weakness, weakness and headaches.  Endo/Heme/Allergies:  Does not bruise/bleed easily.  Psychiatric/Behavioral:  Negative for depression. The patient is not nervous/anxious and does not have insomnia.      PAST MEDICAL HISTORY :  Past Medical History:  Diagnosis Date   Atherosclerosis of abdominal aorta ([place])    Basal cell carcinoma 03/04/2008   Right nose supratip.    CAD (coronary artery disease)    CABG 1998   Cervical spondylosis 10/01/2013   Chronic diastolic CHF (congestive heart failure) ([place])    a. 07/2016 Echo: >55%; b. 10/2016 Echo: EF 55-60%, Gr1 DD, Ao sclerosis w/o stenosis, sev dil LA; c. 08/2017 Echo: EF 60-65%, no rwma, Gr2 DD, mild AS, sev dil LA/RA.   Coronary artery disease    a. 1998 s/p mini-cabg @ [facility] - LIMA->LAD;  b. 07/2016 St Echo: Inadequate HR w/ HTN response;  c.  08/2016 MV: EF 67%, no ischemia; d. 10/2016 NSTEMI/Cath: RCA 95p (4.0x26 Onyx DES), LIMA->LAD nl; e. 09/2017 Cath: LM 40/30, LAD 100ost, RI 80, LCX nl, OM2/3 nl, RCA patent stent, 20m LIMA->LAD nl-->Med Rx.   DDD (degenerative disc disease), cervical    DDD (degenerative disc disease), lumbar    Dementia with parkinsonism ([place] 06/03/2020   Depression    Gait abnormality 07/31/2019   GERD (gastroesophageal reflux disease)    History of SCC (squamous cell carcinoma) of skin 07/27/2020   right forearm / ED[facility]   Hyperlipidemia    Hypertension    Hypothyroidism    PAF (paroxysmal atrial fibrillation) ([place]    a. s/p DCCV-->maintaining sinus on amiodarone;  b. CHA2DS2VASc = 5-->eliquis.   Parkinson's disease    tremors   Pleural effusion, right    a. 09/2017 s/p thoracentesis.   PNA (pneumonia) 08/26/2017   Pulmonary embolism ([place] 2011  Pulmonary fibrosis ([place])    Secondary erythrocytosis 01/28/2015   Sleep apnea    wears CPAP   Squamous cell carcinoma of skin 03/19/2015   Right lateral crown. KA-like pattern   Thrombocytopenia ([place])     PAST SURGICAL HISTORY :   Past Surgical History:   Procedure Laterality Date   BACK SURGERY  1960   CARDIAC CATHETERIZATION     CATARACT EXTRACTION Right    CATARACT EXTRACTION Bilateral    CHOLECYSTECTOMY  2010   COLONOSCOPY WITH PROPOFOL N/A 06/07/2018   Procedure: COLONOSCOPY WITH PROPOFOL;  Surgeon: Lollie Sails, MD;  Location[facility] ENDOSCOPY;  Service: Endoscopy;  Laterality: N/A;   CORONARY ARTERY BYPASS GRAFT  01/07/1997   CORONARY STENT INTERVENTION N/A 10/31/2016   Procedure: Coronary Stent Intervention;  Surgeon: Wellington Hampshire, MD;  Location: [facility] LAB;  Service: Cardiovascular;  Laterality: N/A;   ELECTROPHYSIOLOGIC STUDY N/A 07/14/2015   Procedure: CARDIOVERSION;  Surgeon: Yolonda Kida, MD;  Location: [facility] ORS;  Service: Cardiovascular;  Laterality: N/A;   ELECTROPHYSIOLOGIC STUDY N/A 10/12/2015   Procedure: CARDIOVERSION;  Surgeon: Minna Merritts, MD;  Location: [facility] ORS;  Service: Cardiovascular;  Laterality: N/A;   LEFT HEART CATH AND CORONARY ANGIOGRAPHY N/A 10/31/2016   Procedure: Left Heart Cath and Coronary Angiography;  Surgeon: Wellington Hampshire, MD;  Location: [facility];  Service: Cardiovascular;  Laterality: N/A;   OTHER SURGICAL HISTORY  1998   Bypass   RIGHT/LEFT HEART CATH AND CORONARY ANGIOGRAPHY N/A 09/18/2017   Procedure: RIGHT/LEFT HEART CATH AND CORONARY ANGIOGRAPHY;  Surgeon: Wellington Hampshire, MD;  Location: [facility];  Service: Cardiovascular;  Laterality: N/A;    FAMILY HISTORY :   Family History  Problem Relation Age of Onset   Alcohol abuse Father    Parkinson's disease Neg Hx     SOCIAL HISTORY:   Social History   Tobacco Use   Smoking status: Former    Packs/day: 1.00    Years: 10.00    Total pack years: 10.00    Types: Cigarettes, Pipe, Cigars    Quit date: 04/18/1972    Years since quitting: 49.8   Smokeless tobacco: Former    Types: Chew    Quit date: 04/18/1972  Vaping Use   Vaping Use: Never used  Substance Use Topics   Alcohol  use: Yes    Alcohol/week: 4.0 standard drinks of alcohol    Types: 2 Cans of beer, 2 Shots of liquor per week    Comment: per 2 weeks    Drug use: No    ALLERGIES:  is allergic to pravastatin and prednisone.  MEDICATIONS:  Current Outpatient Medications  Medication Sig Dispense Refill   albuterol (VENTOLIN HFA) 108 (90 Base) MCG/ACT inhaler Inhale 2 puffs into the lungs every 6 (six) hours as needed for wheezing or shortness of breath. 8 g 6   buPROPion (WELLBUTRIN XL) 300 MG 24 hr tablet TAKE 1 TABLET BY MOUTH DAILY 90 tablet 0   carbidopa-levodopa (SINEMET IR) 25-250 MG tablet Take 0.5 tablets by mouth 4 (four) times daily. 180 tablet 4   ELIQUIS 5 MG TABS tablet TAKE ONE TABLET BY MOUTH TWICE DAILY 180 tablet 1   entacapone (COMTAN) 200 MG tablet Take 1 tablet (200 mg total) by mouth 3 (three) times daily. 270 tablet 4   esomeprazole (NEXIUM) 40 MG capsule Take 1 capsule (40 mg total) by mouth daily. 90 capsule 1   feeding supplement (ENSURE ENLIVE /  ENSURE PLUS) LIQD Take 237 mLs by mouth 3 (three) times daily between meals. 237 mL 12   fluticasone (FLONASE) 50 MCG/ACT nasal spray USE 2 PUFFS IN EACH NOSTRIL DAILY (Patient taking differently: Place 2 sprays into both nostrils daily.) 16 g 3   furosemide (LASIX) 40 MG tablet TAKE 1 TABLET BY MOUTH DAILY 90 tablet 1   hydrocortisone 2.5 % lotion For seborrheic dermatitis of the face apply to aa's QAM on Tuesday, Thursday, and Saturday. 59 mL 0   ketoconazole (NIZORAL) 2 % cream Apply a thin coat to face QAM on Monday, Wednesday, and Friday. 60 g 3   ketoconazole (NIZORAL) 2 % shampoo Shampoo into the face and scalp let sit 5 minutes then wash off. Use 3d/wk. 120 mL 3   lactulose (CHRONULAC) 10 GM/15ML solution TAKE 30 MLS BY MOUTH TWICE DAILY AS NEEDED FOR MODERATE CONSTIPATION 236 mL 0   levothyroxine (SYNTHROID) 50 MCG tablet Take 1 tablet (50 mcg total) by mouth daily before breakfast. TAKE ONE TABLET ON AN EMPTY STOMACH WITH A GLASS OF  WATER AT LEAST 30 TO 60 MINUTES BEFORE BREAKFAST. 90 tablet 1   metoprolol tartrate (LOPRESSOR) 25 MG tablet Take 1 tablet (25 mg total) by mouth 2 (two) times daily. 180 tablet 3   mirabegron ER (MYRBETRIQ) 25 MG TB24 tablet Take 1 tablet (25 mg total) by mouth daily. 30 tablet 11   rosuvastatin (CRESTOR) 10 MG tablet Take 1 tablet (10 mg total) by mouth daily. 90 tablet 1   sertraline (ZOLOFT) 50 MG tablet TAKE 1 TABLET BY MOUTH DAILY 30 tablet 3   STIOLTO RESPIMAT 2.5-2.5 MCG/ACT AERS SMARTSIG:2 Puff(s) Via Inhaler Daily     losartan (COZAAR) 100 MG tablet TAKE 1 TABLET BY MOUTH DAILY (Patient not taking: Reported on 02/04/2022) 90 tablet 1   No current facility-administered medications for this visit.    PHYSICAL EXAMINATION:   BP (!) 178/75   Pulse (!) 57   Temp 97.8 F (36.6 C)   Resp 20   Wt 217 lb 3.2 oz (98.5 kg)   SpO2 100%   BMI 34.02 kg/m   Filed Weights   02/04/22 1507  Weight: 217 lb 3.2 oz (98.5 kg)    Physical Exam Constitutional:      Comments: Accompanied by his wife.  HENT:     Head: Normocephalic and atraumatic.     Mouth/Throat:     Pharynx: No oropharyngeal exudate.  Eyes:     Pupils: Pupils are equal, round, and reactive to light.  Cardiovascular:     Rate and Rhythm: Normal rate and regular rhythm.  Pulmonary:     Effort: No respiratory distress.     Breath sounds: No wheezing.  Abdominal:     General: Bowel sounds are normal. There is no distension.     Palpations: Abdomen is soft. There is no mass.     Tenderness: There is no abdominal tenderness. There is no guarding or rebound.  Musculoskeletal:        General: No tenderness. Normal range of motion.     Cervical back: Normal range of motion and neck supple.  Skin:    General: Skin is warm.  Neurological:     Mental Status: He is alert and oriented to person, place, and time.  Psychiatric:        Mood and Affect: Affect normal.      LABORATORY DATA:  I have reviewed the data as  listed    Component Value  Date/Time   NA 139 02/04/2022 1528   NA 136 05/03/2021 1440   K 4.2 02/04/2022 1528   CL 105 02/04/2022 1528   CO2 26 02/04/2022 1528   GLUCOSE 101 (H) 02/04/2022 1528   BUN 20 02/04/2022 1528   BUN 15 05/03/2021 1440   CREATININE 0.93 02/04/2022 1528   CREATININE 0.95 12/06/2011 1554   CALCIUM 8.8 (L) 02/04/2022 1528   PROT 7.3 02/04/2022 1528   PROT 6.9 05/03/2021 1440   PROT 7.6 09/20/2011 1529   ALBUMIN 3.8 02/04/2022 1528   ALBUMIN 4.4 05/03/2021 1440   ALBUMIN 3.7 09/20/2011 1529   AST 18 02/04/2022 1528   AST 18 09/20/2011 1529   ALT 11 02/04/2022 1528   ALT 23 09/20/2011 1529   ALKPHOS 81 02/04/2022 1528   ALKPHOS 133 09/20/2011 1529   BILITOT 1.2 02/04/2022 1528   BILITOT 1.0 05/03/2021 1440   BILITOT 0.5 09/20/2011 1529   GFRNONAA >60 02/04/2022 1528   GFRNONAA >60 12/06/2011 1554   GFRAA >60 12/09/2019 1308   GFRAA >60 12/06/2011 1554    No results found for: "SPEP", "UPEP"  Lab Results  Component Value Date   WBC 10.2 02/04/2022   NEUTROABS 7.5 02/04/2022   HGB 15.9 02/04/2022   HCT 49.4 02/04/2022   MCV 94.8 02/04/2022   PLT 140 (L) 02/04/2022      Chemistry      Component Value Date/Time   NA 139 02/04/2022 1528   NA 136 05/03/2021 1440   K 4.2 02/04/2022 1528   CL 105 02/04/2022 1528   CO2 26 02/04/2022 1528   BUN 20 02/04/2022 1528   BUN 15 05/03/2021 1440   CREATININE 0.93 02/04/2022 1528   CREATININE 0.95 12/06/2011 1554      Component Value Date/Time   CALCIUM 8.8 (L) 02/04/2022 1528   ALKPHOS 81 02/04/2022 1528   ALKPHOS 133 09/20/2011 1529   AST 18 02/04/2022 1528   AST 18 09/20/2011 1529   ALT 11 02/04/2022 1528   ALT 23 09/20/2011 1529   BILITOT 1.2 02/04/2022 1528   BILITOT 1.0 05/03/2021 1440   BILITOT 0.5 09/20/2011 1529        ASSESSMENT & PLAN:   Thrombocytopenia ([place]) # Mild thrombocytopenia platelets-122. question secondary to alcohol versus liver disease.vs ITP. STABLE  Monitor for  now.  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 STABLE.; HCT 47.6-  HOLD phlebotomy.   # CAD/-stenting [Dr.Arida] A. Fib- on Eliquis per cardiology/ see above-STABLE.  #Interstitial lung disease- on Nintedanib-STABLE.  # Hx of Falls-parkinsons/hallucinations [Dr.Willis; [facility]]   # Mild Pulmonary fibrosis- STABLE [July 2022]; intolerance to medications  # I spoke to his wife; the above plan of care.#Since patient is clinically stable I think is reasonable for the patient to follow-up with PCP/can follow-up with [place] as needed.  Patient comfortable with the plan; to call us if any questions or concerns in the interim.   # DISPOSITION:  # NO Phlebotomy today # Follow up as needed- Dr.B  Cc; Dr.Sonnenberg     Cammie Sickle, MD 02/07/2022 7:39 AM

## 2022-02-04 NOTE — Assessment & Plan Note (Addendum)
#   Mild thrombocytopenia platelets-122. question secondary to alcohol versus liver disease.vs ITP. STABLE  Monitor for now.  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 STABLE.; HCT 47.6-  HOLD phlebotomy.   # CAD/-stenting [Dr.Arida] A. Fib- on Eliquis per cardiology/ see above-STABLE.  #Interstitial lung disease- on Nintedanib-STABLE.  # Hx of Falls-parkinsons/hallucinations [Dr.Willis; [facility]]   # Mild Pulmonary fibrosis- STABLE [July 2022]; intolerance to medications  # I spoke to his wife; the above plan of care.#Since patient is clinically stable I think is reasonable for the patient to follow-up with PCP/can follow-up with [place] as needed.  Patient comfortable with the plan; to call us if any questions or concerns in the interim.   # DISPOSITION:  # NO Phlebotomy today # Follow up as needed- Dr.B  Cc; Dr.Sonnenberg

## 2022-02-07 ENCOUNTER — Encounter: Payer: Self-pay | Admitting: Internal Medicine

## 2022-02-07 ENCOUNTER — Telehealth: Payer: Self-pay | Admitting: Diagnostic Neuroimaging

## 2022-02-07 NOTE — Telephone Encounter (Signed)
Pt wife is calling. Stated Pt is doing a lot of sleeping and jerking a lot. She said she is really concern about this type of sleeping. She is requesting a call-back from nurse.

## 2022-02-08 NOTE — Telephone Encounter (Signed)
Called wife who stated she may be over reacting. I advised if this is worse than his baseline she should call PCP to have him evaluated for underlying causes. She verbalized understanding, appreciation.

## 2022-02-24 ENCOUNTER — Telehealth: Payer: Self-pay | Admitting: Diagnostic Neuroimaging

## 2022-02-24 ENCOUNTER — Other Ambulatory Visit: Payer: Self-pay | Admitting: Family Medicine

## 2022-02-24 NOTE — Telephone Encounter (Signed)
Pt's wife called stating that the pt has been getting worse with his demansia and she would like to know if he can be referred to a [facility] provider for testing. Please advise.

## 2022-02-24 NOTE — Telephone Encounter (Signed)
Called the wife back. The wife states that she feels his dementia is worsening. She notes that he is sleeping more and more and that she is just needing guidance on what she should do. She stated that today he slept all the way up til 3 pm. He went to bed at 10:30 and slept til 3 pm with exception of getting up and going to the bathroom. I asked her how long this has been going on and she couldn't give clear answer. She said about a month ago he had another day where he slept til 4 p but then he started to get back on normal routine. I asked had their been any new medications started that could have caused the extra sleepiness and she doesn't feel that is the case.   I advised her message indicated that she wanted a referral to a [facility] neurologist. I asked why and she said because their insurance will cover if she gets a referral sent. I asked what is her reasoning for asking for a referral because it sounds as if there is progression in his mental status related to his parkinsons/dementia. It appears there was a referral for a Education officer, museum placed in march by PCP. She doesn't recall anyone coming out. I advised her that it would be beneficial to touch back with PCP to see about them placing another referral for social worker to get her some assistance in the home. She sounded as if she is doing this all alone and she is in her 24's. She continued to talk about [facility] referral and I again asked her reasoning for this and she states she just likes to have second opinions and she wants to make sure everything is being done for him. I recommended that she touch base with her PCP and have them see about getting her some assistance and they can also place a referral for another neurologist in the [facility] system if they think it is appropriate. It is not being recommended from [place] at this time. She was appreciative for the call back and verbalized understanding

## 2022-02-25 ENCOUNTER — Telehealth: Payer: Self-pay

## 2022-02-25 DIAGNOSIS — G20A1 Parkinson's disease without dyskinesia, without mention of fluctuations: Secondary | ICD-10-CM

## 2022-02-25 DIAGNOSIS — F0280 Dementia in other diseases classified elsewhere without behavioral disturbance: Secondary | ICD-10-CM

## 2022-02-25 NOTE — Telephone Encounter (Signed)
[name] called and she stated that she wants a second opinion about her husbands dementia, she stated she does not want to put him in a home she wants to care for him at home and the last referral she received for the patient they did not help her and tell her how to care for him at home.  She stated he seems to be getting worse, he woke up and told her he wanted to go home.  Please advise. Trevaris Pennella,cma

## 2022-02-25 NOTE — Telephone Encounter (Signed)
Noted. Referral placed to [facility] in [place] for a second opinion.

## 2022-02-25 NOTE — Telephone Encounter (Signed)
[name], [name], called to state she would like to speak with [name], CMA, regarding patient's dementia.

## 2022-02-25 NOTE — Telephone Encounter (Signed)
I called and spoke with the wife and informed her that the referral was sent and she understood.  Desmond Szabo,cma

## 2022-02-28 ENCOUNTER — Other Ambulatory Visit: Payer: Self-pay | Admitting: Family Medicine

## 2022-03-07 ENCOUNTER — Other Ambulatory Visit (INDEPENDENT_AMBULATORY_CARE_PROVIDER_SITE_OTHER): Payer: HMO

## 2022-03-07 DIAGNOSIS — E039 Hypothyroidism, unspecified: Secondary | ICD-10-CM

## 2022-03-07 DIAGNOSIS — E785 Hyperlipidemia, unspecified: Secondary | ICD-10-CM

## 2022-03-07 LAB — HEPATIC FUNCTION PANEL
ALT: 18 U/L (ref 0–53)
AST: 16 U/L (ref 0–37)
Albumin: 4.1 g/dL (ref 3.5–5.2)
Alkaline Phosphatase: 86 U/L (ref 39–117)
Bilirubin, Direct: 0.2 mg/dL (ref 0.0–0.3)
Total Bilirubin: 1.2 mg/dL (ref 0.2–1.2)
Total Protein: 6.8 g/dL (ref 6.0–8.3)

## 2022-03-07 LAB — TSH: TSH: 6.97 u[IU]/mL — ABNORMAL HIGH (ref 0.35–5.50)

## 2022-03-07 LAB — LDL CHOLESTEROL, DIRECT: Direct LDL: 118 mg/dL

## 2022-03-15 ENCOUNTER — Telehealth: Payer: Self-pay

## 2022-03-15 NOTE — Telephone Encounter (Signed)
[name], Tyr Franca, called with questions regarding dose and timing of rosuvastatin (CRESTOR) 10 MG tablet for patient.

## 2022-03-15 NOTE — Telephone Encounter (Signed)
[name] would like to know if he is to take 20 mg one time a day or one in AM and one in PM?

## 2022-03-16 NOTE — Telephone Encounter (Signed)
He would take crestor 20 mg once daily. If they are ok with this he would need his labs rechecked 6 weeks?

## 2022-03-17 ENCOUNTER — Telehealth: Payer: Self-pay

## 2022-03-17 NOTE — Telephone Encounter (Signed)
[name], Sinai Mahany, returned our call.  I read Dr. Georges Mouse message to [place].  Andre [name] states patient has an appointment with his new neurologist in January,2024.  Andre [name] states she would like to know if Dr. [name] can help patient to get in with neurologist faster.

## 2022-03-17 NOTE — Telephone Encounter (Signed)
LMTCB

## 2022-03-17 NOTE — Telephone Encounter (Signed)
-----   Message from [name], MD sent at 03/16/2022  9:53 AM EST ----- He needs to see the new neurologist to determine what the next stp might be.

## 2022-03-17 NOTE — Telephone Encounter (Signed)
[name], Man Effertz, called back to find out Dr. Georges Mouse answer to her question.  I read Dr. Ellen Henri note to [name].

## 2022-03-21 ENCOUNTER — Telehealth: Payer: Self-pay | Admitting: Diagnostic Neuroimaging

## 2022-03-21 ENCOUNTER — Telehealth: Payer: Self-pay

## 2022-03-21 NOTE — Telephone Encounter (Signed)
-----   Message from [name], MD sent at 03/16/2022  9:53 AM EST ----- He needs to see the new neurologist to determine what the next stp might be.

## 2022-03-21 NOTE — Telephone Encounter (Signed)
Patient returned office phone call. 

## 2022-03-21 NOTE — Telephone Encounter (Addendum)
Pt's wife, Jaedan Huttner want to know what Dr. Leta Baptist thinks about the medication, Rexulti for Parkinson's . Would like a call back.

## 2022-03-21 NOTE — Telephone Encounter (Signed)
Spoke to [name] due to the Patient stated he had not called anyone or wanted to talk to anyone and the wife was telling him it is Dr. Ellen Henri office. I let her know that he needs to see neurology to determine the next steps in care. Wife states maybe they can get in sooner with Dr. Manuella Ghazi.

## 2022-03-21 NOTE — Telephone Encounter (Signed)
LMTCB regarding him needing to see the neurologist to determine the next step in care

## 2022-03-22 NOTE — Telephone Encounter (Signed)
Noted. I would suggest they call Dr Trena Platt office to see if they have any sooner appointments.

## 2022-03-22 NOTE — Telephone Encounter (Signed)
See other phone note

## 2022-03-22 NOTE — Telephone Encounter (Signed)
Called Patient to let him know to call Dr Trena Platt office to see if he can get in sooner or get on a sooner availability list.

## 2022-03-25 ENCOUNTER — Other Ambulatory Visit: Payer: Self-pay

## 2022-03-25 ENCOUNTER — Emergency Department: Payer: HMO

## 2022-03-25 ENCOUNTER — Telehealth: Payer: Self-pay

## 2022-03-25 ENCOUNTER — Encounter: Payer: Self-pay | Admitting: Emergency Medicine

## 2022-03-25 ENCOUNTER — Emergency Department
Admission: EM | Admit: 2022-03-25 | Discharge: 2022-03-25 | Disposition: A | Discharge status: Home / Self Care | Payer: HMO | Attending: Emergency Medicine | Admitting: Emergency Medicine

## 2022-03-25 DIAGNOSIS — I10 Essential (primary) hypertension: Secondary | ICD-10-CM | POA: Diagnosis not present

## 2022-03-25 DIAGNOSIS — G20C Parkinsonism, unspecified: Secondary | ICD-10-CM | POA: Insufficient documentation

## 2022-03-25 DIAGNOSIS — W010XXA Fall on same level from slipping, tripping and stumbling without subsequent striking against object, initial encounter: Secondary | ICD-10-CM | POA: Diagnosis not present

## 2022-03-25 DIAGNOSIS — R0789 Other chest pain: Secondary | ICD-10-CM | POA: Insufficient documentation

## 2022-03-25 DIAGNOSIS — F0390 Unspecified dementia without behavioral disturbance: Secondary | ICD-10-CM | POA: Diagnosis not present

## 2022-03-25 DIAGNOSIS — W19XXXA Unspecified fall, initial encounter: Secondary | ICD-10-CM

## 2022-03-25 DIAGNOSIS — R0602 Shortness of breath: Secondary | ICD-10-CM | POA: Diagnosis not present

## 2022-03-25 LAB — TROPONIN I (HIGH SENSITIVITY): Troponin I (High Sensitivity): 10 ng/L (ref <18)

## 2022-03-25 LAB — CBC
HCT: 50.2 % (ref 39.0–52.0)
Hemoglobin: 16.2 g/dL (ref 13.0–17.0)
MCH: 29.9 pg (ref 26.0–34.0)
MCHC: 32.3 g/dL (ref 30.0–36.0)
MCV: 92.6 fL (ref 80.0–100.0)
Platelets: 144 10*3/uL — ABNORMAL LOW (ref 150–400)
RBC: 5.42 MIL/uL (ref 4.22–5.81)
RDW: 13.6 % (ref 11.5–15.5)
WBC: 11.2 10*3/uL — ABNORMAL HIGH (ref 4.0–10.5)
nRBC: 0 % (ref 0.0–0.2)

## 2022-03-25 LAB — BASIC METABOLIC PANEL
Anion gap: 4 — ABNORMAL LOW (ref 5–15)
BUN: 19 mg/dL (ref 8–23)
CO2: 25 mmol/L (ref 22–32)
Calcium: 9.1 mg/dL (ref 8.9–10.3)
Chloride: 107 mmol/L (ref 98–111)
Creatinine, Ser: 0.8 mg/dL (ref 0.61–1.24)
GFR, Estimated: >60 mL/min (ref >60)
Glucose, Bld: 113 mg/dL — ABNORMAL HIGH (ref 70–99)
Potassium: 3.8 mmol/L (ref 3.5–5.1)
Sodium: 136 mmol/L (ref 135–145)

## 2022-03-25 LAB — BRAIN NATRIURETIC PEPTIDE: B Natriuretic Peptide: 582.7 pg/mL — ABNORMAL HIGH (ref 0.0–100.0)

## 2022-03-25 NOTE — ED Triage Notes (Signed)
Pt to triage via w/c with no distress noted; pt st he rolled OOB onto the floor and is c/o pain in his chest and wants to make sure "his heart is OK"; pt with hx dementia; both wife & pt are poor historians; wife st that he falls a lot and "she does too"; wife st that she is the pt's legal guardian

## 2022-03-25 NOTE — ED Provider Notes (Signed)
[facility] Provider Note    Event Date/Time   First MD Initiated Contact with Patient 03/25/22 [phone]     (approximate)  History   Chief Complaint: Fall  HPI  Andre [name] is a 86 y.o. male with a past medical history of Parkinson's, dementia, gastric reflux, hypertension, hyperlipidemia, presents the emergency department after a fall.  According to the patient he was sitting on the side of the bed when his legs slipped out from under him causing him to fall forward.  Patient denies hitting his head.  Denies LOC.  Was able to get back up but was having some pain in the left chest after the fall and wanted to get evaluated to the patient came to the emergency department for evaluation.  Denies any shortness of breath denies any nausea or diaphoresis.  No LOC.  Physical Exam   Triage Vital Signs: ED Triage Vitals  Enc Vitals Group     BP 03/25/22 0630 (!) 121/96     Pulse Rate 03/25/22 0630 (!) 57     Resp 03/25/22 0630 18     Temp 03/25/22 0630 98.3 F (36.8 C)     Temp Source 03/25/22 0630 Oral     SpO2 03/25/22 0630 95 %     Weight 03/25/22 0647 209 lb (94.8 kg)     Height 03/25/22 0647 '5\' 7"'$  (1.702 m)     Head Circumference --      Peak Flow --      Pain Score --      Pain Loc --      Pain Edu? --      Excl. in [place]? --     Most recent vital signs: Vitals:   03/25/22 0630  BP: (!) 121/96  Pulse: (!) 57  Resp: 18  Temp: 98.3 F (36.8 C)  SpO2: 95%    General: Awake, no distress.  CV:  Good peripheral perfusion.  Regular rate and rhythm  Resp:  Normal effort.  Equal breath sounds bilaterally.  Abd:  No distention.  Soft, nontender.  No rebound or guarding. Other:  Mild left-sided anterior chest wall tenderness to palpation.   ED Results / Procedures / Treatments   EKG  EKG viewed and interpreted by myself shows sinus bradycardia 57 bpm with a narrow QRS, left axis deviation, largely normal intervals besides borderline PR prolongation,  nonspecific but no concerning ST changes.  RADIOLOGY  I have reviewed and interpreted the chest x-ray images.  Patient does have some slight haziness bilaterally. Radiology has read the x-ray is essentially unchanged since 05/07/2021.   MEDICATIONS ORDERED IN ED: Medications - No data to display   IMPRESSION / MDM / [place] / ED COURSE  I reviewed the triage vital signs and the nursing notes.  Patient's presentation is most consistent with acute presentation with potential threat to life or bodily function.  Patient presents emergency department for left-sided chest pain after a fall out of bed this morning.  Overall the patient appears well does have some very mild left anterior chest wall tenderness to palpation as well as a small skin abrasion to his left forearm.  Patient's workup shows reassuring lab work, mildly elevated BNP however largely unchanged from historical values.  Chemistry is reassuring, CBC is normal troponin is negative.  Chest x-ray does not appear to show any acute abnormality, EKG shows no concerning findings.  Suspect likely chest wall pain/musculoskeletal pain from the fall.  We will  discharge with PCP follow-up.  Recommended Tylenol as needed for discomfort.  Patient agreeable to plan of care.  Overall the patient appears quite well.  FINAL CLINICAL IMPRESSION(S) / ED DIAGNOSES   Fall Chest wall pain   Note:  This document was prepared using Dragon voice recognition software and may include unintentional dictation errors.   Harvest Dark, MD 03/25/22 [phone]

## 2022-03-25 NOTE — Telephone Encounter (Signed)
[name], Andre [name], states patient fell this morning and went to the hospital to be checked out.  Patient is back home and resting.  [name] states the ED recommended he call for an appointment with his PCP but [name] states they have one coming up on 04/22/2021, so she does not want to schedule one unless Dr. [name] thinks they need to.

## 2022-03-28 NOTE — Telephone Encounter (Signed)
Called the patient's wife back. There was no answer. LVM advising that I got Dr Gladstone Lighter thoughts on the medication. Advised that typically that medication is used to help with agitation and restlessness. Advised that if she thinks the pt could benefit with something like this, she could call back and discuss. Dr Leta Baptist did agree he would be willing to prescribe this is they feel this is needed.

## 2022-03-28 NOTE — Telephone Encounter (Signed)
I think it is fine if he follows up in January as scheduled unless his pain is not improving or if it worsens.

## 2022-04-04 ENCOUNTER — Other Ambulatory Visit: Payer: Self-pay

## 2022-04-04 ENCOUNTER — Encounter: Payer: Self-pay | Admitting: *Deleted

## 2022-04-04 ENCOUNTER — Telehealth: Payer: Self-pay | Admitting: Diagnostic Neuroimaging

## 2022-04-04 ENCOUNTER — Emergency Department: Payer: HMO

## 2022-04-04 ENCOUNTER — Inpatient Hospital Stay
Admission: EM | Admit: 2022-04-04 | Discharge: 2022-05-19 | DRG: 085 | Disposition: E | Payer: HMO | Attending: Internal Medicine | Admitting: Internal Medicine

## 2022-04-04 DIAGNOSIS — E785 Hyperlipidemia, unspecified: Secondary | ICD-10-CM | POA: Diagnosis present

## 2022-04-04 DIAGNOSIS — N3000 Acute cystitis without hematuria: Secondary | ICD-10-CM | POA: Diagnosis not present

## 2022-04-04 DIAGNOSIS — Z85828 Personal history of other malignant neoplasm of skin: Secondary | ICD-10-CM | POA: Diagnosis not present

## 2022-04-04 DIAGNOSIS — I480 Paroxysmal atrial fibrillation: Secondary | ICD-10-CM | POA: Diagnosis present

## 2022-04-04 DIAGNOSIS — I5032 Chronic diastolic (congestive) heart failure: Secondary | ICD-10-CM | POA: Diagnosis not present

## 2022-04-04 DIAGNOSIS — Z1152 Encounter for screening for COVID-19: Secondary | ICD-10-CM

## 2022-04-04 DIAGNOSIS — Z888 Allergy status to other drugs, medicaments and biological substances status: Secondary | ICD-10-CM

## 2022-04-04 DIAGNOSIS — Z86711 Personal history of pulmonary embolism: Secondary | ICD-10-CM

## 2022-04-04 DIAGNOSIS — I10 Essential (primary) hypertension: Secondary | ICD-10-CM | POA: Diagnosis present

## 2022-04-04 DIAGNOSIS — Z66 Do not resuscitate: Secondary | ICD-10-CM | POA: Diagnosis present

## 2022-04-04 DIAGNOSIS — D72825 Bandemia: Secondary | ICD-10-CM | POA: Diagnosis present

## 2022-04-04 DIAGNOSIS — R296 Repeated falls: Secondary | ICD-10-CM | POA: Diagnosis not present

## 2022-04-04 DIAGNOSIS — E870 Hyperosmolality and hypernatremia: Secondary | ICD-10-CM | POA: Diagnosis present

## 2022-04-04 DIAGNOSIS — I160 Hypertensive urgency: Secondary | ICD-10-CM | POA: Diagnosis present

## 2022-04-04 DIAGNOSIS — E440 Moderate protein-calorie malnutrition: Secondary | ICD-10-CM | POA: Diagnosis present

## 2022-04-04 DIAGNOSIS — I110 Hypertensive heart disease with heart failure: Secondary | ICD-10-CM | POA: Diagnosis present

## 2022-04-04 DIAGNOSIS — Z6833 Body mass index (BMI) 33.0-33.9, adult: Secondary | ICD-10-CM

## 2022-04-04 DIAGNOSIS — Z79890 Hormone replacement therapy (postmenopausal): Secondary | ICD-10-CM

## 2022-04-04 DIAGNOSIS — G20A1 Parkinson's disease without dyskinesia, without mention of fluctuations: Secondary | ICD-10-CM | POA: Diagnosis not present

## 2022-04-04 DIAGNOSIS — G9341 Metabolic encephalopathy: Secondary | ICD-10-CM | POA: Diagnosis not present

## 2022-04-04 DIAGNOSIS — J449 Chronic obstructive pulmonary disease, unspecified: Secondary | ICD-10-CM | POA: Diagnosis not present

## 2022-04-04 DIAGNOSIS — Z7901 Long term (current) use of anticoagulants: Secondary | ICD-10-CM

## 2022-04-04 DIAGNOSIS — S065XAA Traumatic subdural hemorrhage with loss of consciousness status unknown, initial encounter: Secondary | ICD-10-CM | POA: Diagnosis not present

## 2022-04-04 DIAGNOSIS — I5033 Acute on chronic diastolic (congestive) heart failure: Secondary | ICD-10-CM | POA: Diagnosis not present

## 2022-04-04 DIAGNOSIS — Z7189 Other specified counseling: Secondary | ICD-10-CM | POA: Diagnosis not present

## 2022-04-04 DIAGNOSIS — F05 Delirium due to known physiological condition: Secondary | ICD-10-CM | POA: Diagnosis not present

## 2022-04-04 DIAGNOSIS — Z515 Encounter for palliative care: Secondary | ICD-10-CM | POA: Diagnosis not present

## 2022-04-04 DIAGNOSIS — Z8249 Family history of ischemic heart disease and other diseases of the circulatory system: Secondary | ICD-10-CM

## 2022-04-04 DIAGNOSIS — I25119 Atherosclerotic heart disease of native coronary artery with unspecified angina pectoris: Secondary | ICD-10-CM | POA: Diagnosis not present

## 2022-04-04 DIAGNOSIS — Z951 Presence of aortocoronary bypass graft: Secondary | ICD-10-CM

## 2022-04-04 DIAGNOSIS — S065X0A Traumatic subdural hemorrhage without loss of consciousness, initial encounter: Principal | ICD-10-CM | POA: Diagnosis present

## 2022-04-04 DIAGNOSIS — N390 Urinary tract infection, site not specified: Secondary | ICD-10-CM | POA: Diagnosis present

## 2022-04-04 DIAGNOSIS — E039 Hypothyroidism, unspecified: Secondary | ICD-10-CM | POA: Diagnosis present

## 2022-04-04 DIAGNOSIS — R4781 Slurred speech: Secondary | ICD-10-CM | POA: Diagnosis not present

## 2022-04-04 DIAGNOSIS — W19XXXA Unspecified fall, initial encounter: Secondary | ICD-10-CM | POA: Diagnosis present

## 2022-04-04 DIAGNOSIS — E860 Dehydration: Secondary | ICD-10-CM | POA: Diagnosis present

## 2022-04-04 DIAGNOSIS — J811 Chronic pulmonary edema: Secondary | ICD-10-CM | POA: Diagnosis not present

## 2022-04-04 DIAGNOSIS — Z87891 Personal history of nicotine dependence: Secondary | ICD-10-CM

## 2022-04-04 DIAGNOSIS — Z811 Family history of alcohol abuse and dependence: Secondary | ICD-10-CM

## 2022-04-04 DIAGNOSIS — G4733 Obstructive sleep apnea (adult) (pediatric): Secondary | ICD-10-CM

## 2022-04-04 DIAGNOSIS — R1310 Dysphagia, unspecified: Secondary | ICD-10-CM | POA: Diagnosis present

## 2022-04-04 DIAGNOSIS — D696 Thrombocytopenia, unspecified: Secondary | ICD-10-CM | POA: Diagnosis not present

## 2022-04-04 DIAGNOSIS — K219 Gastro-esophageal reflux disease without esophagitis: Secondary | ICD-10-CM | POA: Diagnosis present

## 2022-04-04 DIAGNOSIS — F0280 Dementia in other diseases classified elsewhere without behavioral disturbance: Secondary | ICD-10-CM | POA: Diagnosis not present

## 2022-04-04 DIAGNOSIS — Z79899 Other long term (current) drug therapy: Secondary | ICD-10-CM | POA: Diagnosis not present

## 2022-04-04 DIAGNOSIS — R0600 Dyspnea, unspecified: Secondary | ICD-10-CM | POA: Diagnosis not present

## 2022-04-04 DIAGNOSIS — J8410 Pulmonary fibrosis, unspecified: Secondary | ICD-10-CM | POA: Diagnosis present

## 2022-04-04 LAB — PROTIME-INR
INR: 1.3 — ABNORMAL HIGH (ref 0.8–1.2)
Prothrombin Time: 15.8 seconds — ABNORMAL HIGH (ref 11.4–15.2)

## 2022-04-04 LAB — BASIC METABOLIC PANEL
Anion gap: 10 (ref 5–15)
BUN: 19 mg/dL (ref 8–23)
CO2: 27 mmol/L (ref 22–32)
Calcium: 9.3 mg/dL (ref 8.9–10.3)
Chloride: 101 mmol/L (ref 98–111)
Creatinine, Ser: 0.84 mg/dL (ref 0.61–1.24)
GFR, Estimated: >60 mL/min (ref >60)
Glucose, Bld: 115 mg/dL — ABNORMAL HIGH (ref 70–99)
Potassium: 4.2 mmol/L (ref 3.5–5.1)
Sodium: 138 mmol/L (ref 135–145)

## 2022-04-04 LAB — URINALYSIS, ROUTINE W REFLEX MICROSCOPIC
Bilirubin Urine: NEGATIVE
Glucose, UA: NEGATIVE mg/dL
Hgb urine dipstick: NEGATIVE
Ketones, ur: 5 mg/dL — AB
Leukocytes,Ua: NEGATIVE
Nitrite: NEGATIVE
Protein, ur: NEGATIVE mg/dL
Specific Gravity, Urine: 1.023 (ref 1.005–1.030)
pH: 5 (ref 5.0–8.0)

## 2022-04-04 LAB — CBC
HCT: 48.9 % (ref 39.0–52.0)
Hemoglobin: 15.8 g/dL (ref 13.0–17.0)
MCH: 29.8 pg (ref 26.0–34.0)
MCHC: 32.3 g/dL (ref 30.0–36.0)
MCV: 92.3 fL (ref 80.0–100.0)
Platelets: 141 10*3/uL — ABNORMAL LOW (ref 150–400)
RBC: 5.3 MIL/uL (ref 4.22–5.81)
RDW: 13.6 % (ref 11.5–15.5)
WBC: 15.5 10*3/uL — ABNORMAL HIGH (ref 4.0–10.5)
nRBC: 0 % (ref 0.0–0.2)

## 2022-04-04 LAB — TROPONIN I (HIGH SENSITIVITY)
Troponin I (High Sensitivity): 12 ng/L (ref <18)
Troponin I (High Sensitivity): 11 ng/L (ref <18)

## 2022-04-04 MED ORDER — NICARDIPINE HCL IN NACL 20-0.86 MG/200ML-% IV SOLN
0.0000 mg/h | INTRAVENOUS | Status: DC
  Administered 2022-04-04: 5 mg/h via INTRAVENOUS
  Filled 2022-04-04: qty 200

## 2022-04-04 MED ORDER — EMPTY CONTAINERS FLEXIBLE MISC
1800.0000 mg | Freq: Once | Status: AC
  Administered 2022-04-04: 1800 mg via INTRAVENOUS
  Filled 2022-04-04: qty 180

## 2022-04-04 NOTE — ED Triage Notes (Addendum)
Wife states pt is here for placement  pt has Dementia. Pt lives at home.  Pt unable to answer questions.  Pt mumbles out.  Pt sitting in a wheelchair.  Wife states pt is worse past few days, recent falls, combative at times.  Pt calm and cooperative in triage.

## 2022-04-04 NOTE — ED Provider Notes (Addendum)
[facility] Provider Note    Event Date/Time   First MD Initiated Contact with Patient 03/25/2022 2129     (approximate)   History   Altered Mental Status   HPI  Andre [name] is a 86 y.o. male   Past medical history of dementia, atrial fibrillation and PE on Eliquis, hyperlipidemia hypertension who presents to the emergency department with altered mental status over the last several days per his wife.  Also with a fall from home.  History was obtained via minimal from the patient due to altered mental status and dementia, history as above mostly obtained by his wife.      Physical Exam   Triage Vital Signs: ED Triage Vitals  Enc Vitals Group     BP 04/16/2022 1931 (!) 159/69     Pulse Rate 04/01/2022 1931 (!) 54     Resp 03/27/2022 1931 20     Temp 03/28/2022 1931 97.7 F (36.5 C)     Temp Source 04/14/2022 1931 Axillary     SpO2 03/27/2022 1931 97 %     Weight 04/13/2022 1932 207 lb 3.7 oz (94 kg)     Height 04/16/2022 1932 '5\' 7"'$  (1.702 m)     Head Circumference --      Peak Flow --      Pain Score 04/12/2022 1932 0     Pain Loc --      Pain Edu? --      Excl. in [place]? --     Most recent vital signs: Vitals:   03/23/2022 2253 03/18/2022 2318  BP: 105/83 (!) 136/58  Pulse: 76 78  Resp: 20 20  Temp:    SpO2: 94% 94%    General: Awake, disoriented and mumbling incoherently. CV:  Good peripheral perfusion.  Resp:  Normal effort.  Abd:  No distention.   It was difficult to assess his neurologic status given his dementia and erratically following commands but he is able to move all extremities, no facial asymmetry, maintaining his airway.   ED Results / Procedures / Treatments   Labs (all labs ordered are listed, but only abnormal results are displayed) Labs Reviewed  BASIC METABOLIC PANEL - Abnormal; Notable for the following components:      Result Value   Glucose, Bld 115 (*)    All other components within normal limits  CBC - Abnormal; Notable  for the following components:   WBC 15.5 (*)    Platelets 141 (*)    All other components within normal limits  PROTIME-INR - Abnormal; Notable for the following components:   Prothrombin Time 15.8 (*)    INR 1.3 (*)    All other components within normal limits  URINALYSIS, ROUTINE W REFLEX MICROSCOPIC - Abnormal; Notable for the following components:   Color, Urine AMBER (*)    APPearance CLEAR (*)    Ketones, ur 5 (*)    All other components within normal limits  TROPONIN I (HIGH SENSITIVITY)  TROPONIN I (HIGH SENSITIVITY)     I reviewed labs and they are notable for blood cell count 15.5, H&H within normal limits.  EKG  ED ECG REPORT I, Lucillie Garfinkel, the attending physician, personally viewed and interpreted this ECG.   Date: 04/13/2022  EKG Time: 1934  Rate: 61  Rhythm: occasional PVC noted, unifocal  Axis:lad  Intervals:none  ST&T Change: No acute ischemic changes    RADIOLOGY I independently reviewed and interpreted CT of the head and see subdural  on the left side with questionable midline shift   PROCEDURES:  Critical Care performed: Yes, see critical care procedure note(s)  .Critical Care  Performed by: Lucillie Garfinkel, MD Authorized by: Lucillie Garfinkel, MD   Critical care provider statement:    Critical care time (minutes):  30   Critical care was necessary to treat or prevent imminent or life-threatening deterioration of the following conditions:  CNS failure or compromise   Critical care was time spent personally by me on the following activities:  Development of treatment plan with patient or surrogate, discussions with consultants, evaluation of patient's response to treatment, examination of patient, ordering and review of laboratory studies, ordering and review of radiographic studies, ordering and performing treatments and interventions, pulse oximetry, re-evaluation of patient's condition and review of old charts    MEDICATIONS ORDERED IN ED: Medications   nicardipine (CARDENE) '20mg'$  in 0.86% saline 293m IV infusion (0.1 mg/ml) (0 mg/hr Intravenous Paused 04/10/2022 2301)  coag fact Xa recombinant (ANDEXXA) high dose infusion 1800 mg (1,800 mg Intravenous New Bag/Given 04/01/2022 2322)    Consultants:  I spoke with Dr. CElayne Guerinof neurosurgery regarding care plan for this patient.   IMPRESSION / MDM / ASSESSMENT AND PLAN / ED COURSE  I reviewed the triage vital signs and the nursing notes.                              Differential diagnosis includes, but is not limited to, intracranial bleeding, CVA, infection, metabolic derangement, ACS   The patient is on the cardiac monitor to evaluate for evidence of arrhythmia and/or significant heart rate changes.  MDM: Patient with a subdural hemorrhage with questionable midline shift.  He is on Eliquis and this was reversed, pharmacy ordered..[name] He is hypertensive and so started nicardipine infusion for blood pressure control.  Dr. CElayne Guerinof neurosurgery was consulted and this patient does not appear to be a surgical candidate and the bleeding is relatively small.  Plan for repeat CT head in 6 hours.  I tried to contact wife but was unable to on multiple attempts.   Patient's presentation is most consistent with acute presentation with potential threat to life or bodily function.       FINAL CLINICAL IMPRESSION(S) / ED DIAGNOSES   Final diagnoses:  SDH (subdural hematoma) ([place]     Rx / DC Orders   ED Discharge Orders     None        Note:  This document was prepared using Dragon voice recognition software and may include unintentional dictation errors.    WLucillie Garfinkel MD 04/05/2022 2Kennedy Bucker   WLucillie Garfinkel MD 03/30/2022 [phone]

## 2022-04-04 NOTE — Telephone Encounter (Signed)
Contacted wife back, she stated pt does not make sense, talking out of his head, rambling, not coherent, agitated and Wont eat. She wants a medical profession to tell her what to do, if he needs to be placed on a facility or what needs to be done. I advised her if she is unable to care for him or he is risking his safety and hers, other options may need to be considered but ultimately it would be her decision. She asked if she should take him to the hospital, advised if symptoms worsen she can. They can also help with placement to SNF if she decided to do that.   Also stated [employer] would have been too high of a copay.   She verbally understood and was appreciative.

## 2022-04-04 NOTE — Telephone Encounter (Signed)
Pt's wife called wanting to know if she can speak to the RN or provider regarding her husband recent acting out incidents and him not making sense when he speaks.

## 2022-04-05 ENCOUNTER — Inpatient Hospital Stay: Payer: HMO

## 2022-04-05 DIAGNOSIS — Z7901 Long term (current) use of anticoagulants: Secondary | ICD-10-CM

## 2022-04-05 DIAGNOSIS — W19XXXA Unspecified fall, initial encounter: Secondary | ICD-10-CM | POA: Diagnosis not present

## 2022-04-05 DIAGNOSIS — S065XAA Traumatic subdural hemorrhage with loss of consciousness status unknown, initial encounter: Principal | ICD-10-CM

## 2022-04-05 DIAGNOSIS — I160 Hypertensive urgency: Secondary | ICD-10-CM | POA: Insufficient documentation

## 2022-04-05 DIAGNOSIS — I5032 Chronic diastolic (congestive) heart failure: Secondary | ICD-10-CM | POA: Insufficient documentation

## 2022-04-05 DIAGNOSIS — R296 Repeated falls: Secondary | ICD-10-CM

## 2022-04-05 LAB — RESP PANEL BY RT-PCR (RSV, FLU A&B, COVID)  RVPGX2
Influenza A by PCR: NEGATIVE
Influenza B by PCR: NEGATIVE
Resp Syncytial Virus by PCR: NEGATIVE
SARS Coronavirus 2 by RT PCR: NEGATIVE

## 2022-04-05 LAB — CBC
HCT: 45 % (ref 39.0–52.0)
Hemoglobin: 14.7 g/dL (ref 13.0–17.0)
MCH: 29.8 pg (ref 26.0–34.0)
MCHC: 32.7 g/dL (ref 30.0–36.0)
MCV: 91.3 fL (ref 80.0–100.0)
Platelets: 126 10*3/uL — ABNORMAL LOW (ref 150–400)
RBC: 4.93 MIL/uL (ref 4.22–5.81)
RDW: 13.5 % (ref 11.5–15.5)
WBC: 14.4 10*3/uL — ABNORMAL HIGH (ref 4.0–10.5)
nRBC: 0 % (ref 0.0–0.2)

## 2022-04-05 LAB — CBG MONITORING, ED: Glucose-Capillary: 119 mg/dL — ABNORMAL HIGH (ref 70–99)

## 2022-04-05 MED ORDER — METOPROLOL TARTRATE 25 MG PO TABS
25.0000 mg | ORAL_TABLET | Freq: Two times a day (BID) | ORAL | Status: DC
  Administered 2022-04-05 – 2022-04-20 (×30): 25 mg via ORAL
  Filled 2022-04-05 (×31): qty 1

## 2022-04-05 MED ORDER — ALBUTEROL SULFATE (2.5 MG/3ML) 0.083% IN NEBU
2.5000 mg | INHALATION_SOLUTION | Freq: Four times a day (QID) | RESPIRATORY_TRACT | Status: DC | PRN
  Administered 2022-04-18: 2.5 mg via RESPIRATORY_TRACT
  Filled 2022-04-05: qty 3

## 2022-04-05 MED ORDER — ACETAMINOPHEN 325 MG PO TABS
650.0000 mg | ORAL_TABLET | Freq: Four times a day (QID) | ORAL | Status: DC | PRN
  Administered 2022-04-07 – 2022-04-19 (×12): 650 mg via ORAL
  Filled 2022-04-05 (×12): qty 2

## 2022-04-05 MED ORDER — BUPROPION HCL 75 MG PO TABS
150.0000 mg | ORAL_TABLET | Freq: Two times a day (BID) | ORAL | Status: DC
  Administered 2022-04-05 – 2022-04-20 (×30): 150 mg via ORAL
  Filled 2022-04-05 (×38): qty 2

## 2022-04-05 MED ORDER — HALOPERIDOL LACTATE 5 MG/ML IJ SOLN
INTRAMUSCULAR | Status: AC
  Administered 2022-04-05: 1 mg via INTRAVENOUS
  Filled 2022-04-05: qty 1

## 2022-04-05 MED ORDER — ENTACAPONE 200 MG PO TABS
200.0000 mg | ORAL_TABLET | Freq: Three times a day (TID) | ORAL | Status: DC
  Administered 2022-04-05 – 2022-04-16 (×33): 200 mg via ORAL
  Filled 2022-04-05 (×39): qty 1

## 2022-04-05 MED ORDER — FAMOTIDINE 20 MG PO TABS
20.0000 mg | ORAL_TABLET | Freq: Two times a day (BID) | ORAL | Status: DC
  Administered 2022-04-05 – 2022-04-18 (×26): 20 mg via ORAL
  Filled 2022-04-05 (×27): qty 1

## 2022-04-05 MED ORDER — AMLODIPINE BESYLATE 5 MG PO TABS
2.5000 mg | ORAL_TABLET | Freq: Every day | ORAL | Status: DC
  Administered 2022-04-06 – 2022-04-09 (×4): 2.5 mg via ORAL
  Filled 2022-04-05 (×4): qty 1

## 2022-04-05 MED ORDER — UMECLIDINIUM BROMIDE 62.5 MCG/ACT IN AEPB
1.0000 | INHALATION_SPRAY | Freq: Every day | RESPIRATORY_TRACT | Status: DC
  Administered 2022-04-07 – 2022-04-18 (×8): 1 via RESPIRATORY_TRACT
  Filled 2022-04-05: qty 7

## 2022-04-05 MED ORDER — ARFORMOTEROL TARTRATE 15 MCG/2ML IN NEBU
15.0000 ug | INHALATION_SOLUTION | Freq: Two times a day (BID) | RESPIRATORY_TRACT | Status: DC
  Administered 2022-04-07 – 2022-04-11 (×9): 15 ug via RESPIRATORY_TRACT
  Filled 2022-04-05 (×9): qty 2

## 2022-04-05 MED ORDER — ONDANSETRON HCL 4 MG PO TABS: 4.0000 mg | ORAL_TABLET | Freq: Four times a day (QID) | ORAL | Status: DC | PRN

## 2022-04-05 MED ORDER — SERTRALINE HCL 50 MG PO TABS
50.0000 mg | ORAL_TABLET | Freq: Every day | ORAL | Status: DC
  Administered 2022-04-05 – 2022-04-20 (×15): 50 mg via ORAL
  Filled 2022-04-05 (×16): qty 1

## 2022-04-05 MED ORDER — SODIUM CHLORIDE 0.9% FLUSH
3.0000 mL | Freq: Two times a day (BID) | INTRAVENOUS | Status: DC
  Administered 2022-04-05 – 2022-04-20 (×19): 3 mL via INTRAVENOUS

## 2022-04-05 MED ORDER — ACETAMINOPHEN 650 MG RE SUPP: 650.0000 mg | Freq: Four times a day (QID) | RECTAL | Status: DC | PRN

## 2022-04-05 MED ORDER — HALOPERIDOL LACTATE 5 MG/ML IJ SOLN
1.0000 mg | INTRAMUSCULAR | Status: AC | PRN
  Administered 2022-04-05 (×2): 1 mg via INTRAVENOUS
  Filled 2022-04-05: qty 1

## 2022-04-05 MED ORDER — ROSUVASTATIN CALCIUM 10 MG PO TABS
10.0000 mg | ORAL_TABLET | Freq: Every day | ORAL | Status: DC
  Administered 2022-04-06 – 2022-04-20 (×14): 10 mg via ORAL
  Filled 2022-04-05 (×16): qty 1

## 2022-04-05 MED ORDER — LEVOTHYROXINE SODIUM 50 MCG PO TABS
50.0000 ug | ORAL_TABLET | Freq: Every day | ORAL | Status: DC
  Administered 2022-04-06 – 2022-04-20 (×14): 50 ug via ORAL
  Filled 2022-04-05 (×15): qty 1

## 2022-04-05 MED ORDER — ONDANSETRON HCL 4 MG/2ML IJ SOLN: 4.0000 mg | Freq: Four times a day (QID) | INTRAMUSCULAR | Status: DC | PRN

## 2022-04-05 MED ORDER — PANTOPRAZOLE SODIUM 40 MG PO TBEC
40.0000 mg | DELAYED_RELEASE_TABLET | Freq: Every day | ORAL | Status: DC
  Administered 2022-04-05: 40 mg via ORAL
  Filled 2022-04-05: qty 1

## 2022-04-05 MED ORDER — FUROSEMIDE 40 MG PO TABS
40.0000 mg | ORAL_TABLET | Freq: Every day | ORAL | Status: DC
  Administered 2022-04-05 – 2022-04-13 (×9): 40 mg via ORAL
  Filled 2022-04-05 (×9): qty 1

## 2022-04-05 MED ORDER — BUPROPION HCL ER (XL) 150 MG PO TB24
300.0000 mg | ORAL_TABLET | Freq: Every day | ORAL | Status: DC
  Filled 2022-04-05: qty 2

## 2022-04-05 MED ORDER — CARBIDOPA-LEVODOPA 25-250 MG PO TABS
0.5000 | ORAL_TABLET | Freq: Four times a day (QID) | ORAL | Status: DC
  Administered 2022-04-05 – 2022-04-20 (×57): 0.5 via ORAL
  Filled 2022-04-05 (×78): qty 1

## 2022-04-05 NOTE — Evaluation (Signed)
Occupational Therapy Evaluation Patient Details Name: Andre [name] MRN: [id number] DOB: [birth year] Today's Date: 04/05/2022   History of Present Illness presented to ER from home secondary to worsening confusion, agitation; admitted for management of acute L frontal SDH (stable across imaging) with small (L to R) midline shift   Clinical Impression   Patient presenting with decreased Ind in self care, balance, functional mobility/transfers, endurance, and safety awareness. Patient's family not present to confirm baseline. Pt with garbled and slurred speech throughout and oriented to self only this session. He does not follow any multimodal cuing during session. B mitts remained donned for pt and therapist safety. Per chart review, pt lives with wife at baseline. He is attempting to remove all clothing items while in bed. Pt does stand when therapist adjusts bed and drops down bed rail. Pt needing total A to pull pants back up hips. Pt standing with min A and then abruptly returns himself to stretcher bed. He is internally and externally distracted throughout session. Pt placed on OT trial in hopes he can progress to short term rehab to address functional deficits before returning home.  Patient will benefit from acute OT to increase overall independence in the areas of ADLs, functional mobility, and safety awareness in order to safely discharge to next venue of care.      Recommendations for follow up therapy are one component of a multi-disciplinary discharge planning process, led by the attending physician.  Recommendations may be updated based on patient status, additional functional criteria and insurance authorization.   Follow Up Recommendations  Skilled nursing-short term rehab (<3 hours/day)     Assistance Recommended at Discharge Frequent or constant Supervision/Assistance  Patient can return home with the following A lot of help with walking and/or transfers;A lot of help with  bathing/dressing/bathroom;Assistance with cooking/housework;Assistance with feeding;Help with stairs or ramp for entrance;Assist for transportation;Direct supervision/assist for financial management;Direct supervision/assist for medications management    Functional Status Assessment  Patient has had a recent decline in their functional status and demonstrates the ability to make significant improvements in function in a reasonable and predictable amount of time.  Equipment Recommendations  Other (comment) (defer to next venue of care)       Precautions / Restrictions Precautions Precautions: Fall Restrictions Weight Bearing Restrictions: No      Mobility Bed Mobility Overal bed mobility: Needs Assistance Bed Mobility: Supine to Sit, Sit to Supine     Supine to sit: Min assist Sit to supine: Min assist   General bed mobility comments: Pt initiating standing and returning back to bed when he wanted to do so.    Transfers Overall transfer level: Needs assistance Equipment used: 1 person hand held assist Transfers: Sit to/from Stand Sit to Stand: Min assist, Mod assist                  Balance Overall balance assessment: Needs assistance Sitting-balance support: No upper extremity supported, Feet supported Sitting balance-Leahy Scale: Fair       Standing balance-Leahy Scale: Poor                             ADL either performed or assessed with clinical judgement   ADL Overall ADL's : Needs assistance/impaired  General ADL Comments: Pt supine in bed attempting to remove all clothing from body while in hallway stretcher. Pt stands and with total A pants pulled up. Pt then puts himself back into bed.     Vision Patient Visual Report: No change from baseline              Pertinent Vitals/Pain Pain Assessment Pain Assessment: PAINAD Breathing: normal Negative Vocalization: none Facial  Expression: smiling or inexpressive Body Language: tense, distressed pacing, fidgeting Consolability: distracted or reassured by voice/touch PAINAD Score: 2 Pain Intervention(s): Repositioned, Limited activity within patient's tolerance     Hand Dominance Right   Extremity/Trunk Assessment Upper Extremity Assessment Upper Extremity Assessment: Difficult to assess due to impaired cognition   Lower Extremity Assessment Lower Extremity Assessment: Difficult to assess due to impaired cognition       Communication Communication Communication: Expressive difficulties;HOH (very garbled and slurred speech)   Cognition Arousal/Alertness: Awake/alert Behavior During Therapy: Restless, Agitated Overall Cognitive Status: No family/caregiver present to determine baseline cognitive functioning                                 General Comments: Pt does not follow commands when attempting to redirect. Hands in B mitts during session. Oriented to name only.                Home Living Family/patient expects to be discharged to:: Private residence                                 Additional Comments: Patient unable to provide meaningful/accurate history.  Per chart/previous notes, lives with wife in [place] home with stairs to enter.  Will verify with wife as available.      Prior Functioning/Environment               Mobility Comments: Patient unable to provide meaningful/accurate history. Per chart/previous notes, patient ambulatory with RW at baseline. ADLs Comments: No family present to determine baseline        OT Problem List: Decreased strength;Decreased cognition;Decreased activity tolerance;Decreased safety awareness;Impaired balance (sitting and/or standing);Decreased knowledge of use of DME or AE;Decreased knowledge of precautions      OT Treatment/Interventions: Self-care/ADL training;Therapeutic exercise;Therapeutic  activities;Cognitive remediation/compensation;Energy conservation;DME and/or AE instruction;Patient/family education;Balance training    OT Goals(Current goals can be found in the care plan section) Acute Rehab OT Goals Patient Stated Goal: none stated OT Goal Formulation: Patient unable to participate in goal setting Time For Goal Achievement: 04/19/22 Potential to Achieve Goals: Fair ADL Goals Pt Will Perform Grooming: with supervision;standing Pt Will Perform Lower Body Dressing: with min guard assist;sit to/from stand Pt Will Transfer to Toilet: with min guard assist;ambulating Pt Will Perform Toileting - Clothing Manipulation and hygiene: with min guard assist;sit to/from stand  OT Frequency: Min 2X/week    Co-evaluation   Reason for Co-Treatment: For patient/therapist safety;Complexity of the patient's impairments (multi-system involvement);Necessary to address cognition/behavior during functional activity PT goals addressed during session: Mobility/safety with mobility   SLP goals addressed during session: Swallowing    AM-PAC OT "6 Clicks" Daily Activity     Outcome Measure Help from another person eating meals?: A Little Help from another person taking care of personal grooming?: A Little Help from another person toileting, which includes using toliet, bedpan, or urinal?: A Lot Help from another person bathing (including washing, rinsing, drying)?:  A Lot Help from another person to put on and taking off regular upper body clothing?: A Little Help from another person to put on and taking off regular lower body clothing?: A Lot 6 Click Score: 15   End of Session    Activity Tolerance: Treatment limited secondary to agitation Patient left: in bed;with nursing/sitter in room;with call bell/phone within reach;with bed alarm set;Other (comment) (bed lowered to floor)  OT Visit Diagnosis: Unsteadiness on feet (R26.81);Repeated falls (R29.6);Muscle weakness (generalized) (M62.81)                 Time: 8871-9597 OT Time Calculation (min): 10 min Charges:  OT General Charges $OT Visit: 1 Visit OT Evaluation $OT Eval Moderate Complexity: 1 [address] [place], OTR/L , CBIS ascom [phone]  04/05/22, 2:40 PM

## 2022-04-05 NOTE — IPAL (Signed)
  Interdisciplinary Goals of Care Family Meeting   Date carried out: 04/05/2022  Location of the meeting: Phone conference  Member's involved: Physician and Family Member or next of kin  Durable Power of Attorney or acting medical decision maker: wife, Andre [name]    Discussion: We discussed goals of care for Andre [name] .  Frequent falls, confusion, dementia, subdural hemorrhage, consultation with neurosurgeon and goals of care  Code status:   Code Status: DNR   Disposition: Continue current acute care  Time spent for the meeting: [place], MD  04/05/2022, 12:27 AM

## 2022-04-05 NOTE — ED Notes (Signed)
Pt continues to intermittently changes positions and goes from restlessness to lying still.

## 2022-04-05 NOTE — ED Notes (Signed)
Meds given for agitation.

## 2022-04-05 NOTE — ED Notes (Signed)
Pt's spouse at bedside.

## 2022-04-05 NOTE — Evaluation (Addendum)
Physical Therapy Evaluation Patient Details Name: Andre [name] MRN: [id number] DOB: 07/03/1935 Today's Date: 04/05/2022  History of Present Illness  presented to ER from home secondary to worsening confusion, agitation; admitted for management of acute L frontal SDH (stable across imaging) with small (L to R) midline shift  Clinical Impression  Patient resting in bed upon arrival to room; hands in safety mitts, constantly pulling at clothes, covers.  Generally restless and slightly agitated at times.  Does respond to name when called, but demonstrates limited awareness of other orientation components.  Limited ability to follow verbal commands, but with fair ability to follow/respond to hand-over-hand assist from therapist (intermittently pulling away, resisting).  Strength appears grossly symmetrical and WFL (as noted through spontaneous movement); no significant focal weakness, asymmetry appreciated.  Currently requiring mod/max assist for bed mobility; min/close sup for unsupported sitting balance (with fair midline orientation); mod/max assist +2 for partial sit/stand from edge of bed with bilat HHA.  Standing balance generally poor, requiring +2 at all times; additional gait/OOB efforts unsafe at this time. Would benefit from skilled PT to address above deficits and promote optimal return to PLOF.; recommend transition to STR upon discharge from acute hospitalization, pending ability to actively participate/progress with skilled PT sessions (will place on 3-5x trial during acute stay).        Recommendations for follow up therapy are one component of a multi-disciplinary discharge planning process, led by the attending physician.  Recommendations may be updated based on patient status, additional functional criteria and insurance authorization.  Follow Up Recommendations Skilled nursing-short term rehab (<3 hours/day) Can patient physically be transported by private vehicle: No    Assistance  Recommended at Discharge Frequent or constant Supervision/Assistance  Patient can return home with the following  Two people to help with walking and/or transfers;Two people to help with bathing/dressing/bathroom    Equipment Recommendations    Recommendations for Other Services       Functional Status Assessment Patient has had a recent decline in their functional status and demonstrates the ability to make significant improvements in function in a reasonable and predictable amount of time.     Precautions / Restrictions Precautions Precautions: Fall Restrictions Weight Bearing Restrictions: No      Mobility  Bed Mobility Overal bed mobility: Needs Assistance Bed Mobility: Supine to Sit, Sit to Supine     Supine to sit: Mod assist, Max assist Sit to supine: Mod assist, Max assist   General bed mobility comments: constant hand-over-hand to initiate and complete all movement    Transfers Overall transfer level: Needs assistance Equipment used: Rolling walker (2 wheels) Transfers: Sit to/from Stand Sit to Stand: Mod assist, +2 physical assistance           General transfer comment: partial sit/stand from bed surface; does clear bed, but unable to fully extend trunk, bilat hips/knees prior to spontaneous return to sitting    Ambulation/Gait               General Gait Details: unsafe/unable  Stairs            Wheelchair Mobility    Modified Rankin (Stroke Patients Only)       Balance Overall balance assessment: Needs assistance Sitting-balance support: No upper extremity supported, Feet supported Sitting balance-Leahy Scale: Fair Sitting balance - Comments: static sitting with close sup; constant cuing/redirection for attention to task (PO assessment by SLP), as patient consistently attempting return to supine     Standing balance-Leahy Scale: Poor  Pertinent Vitals/Pain Pain Assessment Pain  Assessment: PAINAD Breathing: normal Negative Vocalization: none Facial Expression: smiling or inexpressive Body Language: tense, distressed pacing, fidgeting Consolability: distracted or reassured by voice/touch PAINAD Score: 2 Pain Intervention(s): Repositioned, Limited activity within patient's tolerance, Monitored during session    Home Living Family/patient expects to be discharged to:: Private residence                   Additional Comments: Patient unable to provide meaningful/accurate history.  Per chart/previous notes, lives with wife in [place] home with stairs to enter.  Will verify with wife as available.    Prior Function               Mobility Comments: Patient unable to provide meaningful/accurate history. Per chart/previous notes, patient ambulatory with RW at baseline.       Hand Dominance        Extremity/Trunk Assessment   Upper Extremity Assessment Upper Extremity Assessment:  (grossly at least 4-/5 as noted through spontaneous movement; no gross focal weakness appreciated)    Lower Extremity Assessment Lower Extremity Assessment:  (grossly at least 4-/5 as noted through spontaneous movement; no gross focal weakness appreciated)       Communication   Communication: Expressive difficulties;HOH (speech very garbled and difficult to understand, but does have brief moments of clarity and intelligible speech)  Cognition Arousal/Alertness: Awake/alert Behavior During Therapy: Restless, Agitated Overall Cognitive Status: No family/caregiver present to determine baseline cognitive functioning                                 General Comments: Does respond to name when called, but unable to fully articulate/participate with additional cognitive assessment.  Limited ability to follow verbal commands; fair response to hand-over-hand, tactile cuing.  Generally restless and fidgety throughout session (even with mitts on hands).         General Comments      Exercises     Assessment/Plan    PT Assessment Patient needs continued PT services  PT Problem List Decreased activity tolerance;Decreased balance;Decreased mobility;Decreased coordination;Decreased cognition;Decreased knowledge of use of DME;Decreased safety awareness;Decreased knowledge of precautions       PT Treatment Interventions DME instruction;Gait training;Stair training;Functional mobility training;Therapeutic activities;Therapeutic exercise;Balance training;Neuromuscular re-education;Cognitive remediation;Patient/family education    PT Goals (Current goals can be found in the Care Plan section)  Acute Rehab PT Goals PT Goal Formulation: Patient unable to participate in goal setting Time For Goal Achievement: 04/19/22 Potential to Achieve Goals: Fair    Frequency Min 2X/week (trial, pending participation/progress)     Co-evaluation PT/OT/SLP Co-Evaluation/Treatment: Yes Reason for Co-Treatment: For patient/therapist safety;Complexity of the patient's impairments (multi-system involvement);Necessary to address cognition/behavior during functional activity PT goals addressed during session: Mobility/safety with mobility   SLP goals addressed during session: Swallowing     AM-PAC PT "6 Clicks" Mobility  Outcome Measure Help needed turning from your back to your side while in a flat bed without using bedrails?: A Lot Help needed moving from lying on your back to sitting on the side of a flat bed without using bedrails?: A Lot Help needed moving to and from a bed to a chair (including a wheelchair)?: Total Help needed standing up from a chair using your arms (e.g., wheelchair or bedside chair)?: Total Help needed to walk in hospital room?: Total Help needed climbing 3-5 steps with a railing? : Total 6 Click Score: 8  End of Session   Activity Tolerance:  (limited by cognition, restlessness) Patient left: in bed;with bed alarm set;with  nursing/sitter in room Nurse Communication: Mobility status PT Visit Diagnosis: Muscle weakness (generalized) (M62.81);Difficulty in walking, not elsewhere classified (R26.2);Other symptoms and signs involving the nervous system (R29.898) Hemiplegia - caused by: Nontraumatic intracerebral hemorrhage    Time: 1130-1156 PT Time Calculation (min) (ACUTE ONLY): 26 min   Charges:   PT Evaluation $PT Eval Moderate Complexity: 1 Mod         Jerico Grisso H. Owens Shark, PT, DPT, NCS 04/05/22, 1:46 PM [phone]

## 2022-04-05 NOTE — ED Notes (Signed)
Pt continues to moan and roll around in the bed.

## 2022-04-05 NOTE — ED Notes (Signed)
Pt is currently resting in bed asleep

## 2022-04-05 NOTE — ED Notes (Signed)
Pt's spouse to bedside

## 2022-04-05 NOTE — ED Notes (Signed)
Dr. Zhang at bedside.  

## 2022-04-05 NOTE — ED Notes (Signed)
Pt continues to be agitated, mitts in place to prevent pt from pulling out IV. Pt moaning and swinging arms in the air.

## 2022-04-05 NOTE — ED Notes (Signed)
Received report from [place], Therapist, sports. Pt is clean and dry. Pt constantly rolling in bed and attempting to take off mittens. Pt bed in lowest postiion and side rails up

## 2022-04-05 NOTE — ED Notes (Signed)
Dr. Roosevelt Locks notified pt's temp is 100.3

## 2022-04-05 NOTE — Assessment & Plan Note (Signed)
Not acutely exacerbated Continue Stiolto Respimat with as needed albuterol

## 2022-04-05 NOTE — Consult Note (Addendum)
Referring Physician:  Lucillie Garfinkel - for subdural hematoma  Primary Physician:  [name], MD  History of Present Illness: 04/05/2022 [name] presents after a fall.  He is on Eliquis due to atrial fibrillation.  He has had increasing confusion and agitation over the past week.  He does have a baseline of dementia and agitation.  His wife reports that he has been more difficult to understand over the past several days.  He is getting more difficult to manage at home.    He has been unable to participate in his history.  I have utilized the care everywhere function in epic to review the outside records available from external health systems.  Review of Systems:  A 10 point review of systems is negative, except for the pertinent positives and negatives detailed in the HPI.  Past Medical History: Past Medical History:  Diagnosis Date   Atherosclerosis of abdominal aorta ([place])    Basal cell carcinoma 03/04/2008   Right nose supratip.    CAD (coronary artery disease)    CABG 1998   Cervical spondylosis 10/01/2013   Chronic diastolic CHF (congestive heart failure) ([place])    a. 07/2016 Echo: >55%; b. 10/2016 Echo: EF 55-60%, Gr1 DD, Ao sclerosis w/o stenosis, sev dil LA; c. 08/2017 Echo: EF 60-65%, no rwma, Gr2 DD, mild AS, sev dil LA/RA.   Coronary artery disease    a. 1998 s/p mini-cabg @ [facility] - LIMA->LAD;  b. 07/2016 St Echo: Inadequate HR w/ HTN response;  c.  08/2016 MV: EF 67%, no ischemia; d. 10/2016 NSTEMI/Cath: RCA 95p (4.0x26 Onyx DES), LIMA->LAD nl; e. 09/2017 Cath: LM 40/30, LAD 100ost, RI 80, LCX nl, OM2/3 nl, RCA patent stent, 70m LIMA->LAD nl-->Med Rx.   DDD (degenerative disc disease), cervical    DDD (degenerative disc disease), lumbar    Dementia with parkinsonism ([place] 06/03/2020   Depression    Gait abnormality 07/31/2019   GERD (gastroesophageal reflux disease)    History of SCC (squamous cell carcinoma) of skin 07/27/2020   right forearm / EDC    Hyperlipidemia    Hypertension    Hypothyroidism    PAF (paroxysmal atrial fibrillation) ([place]    a. s/p DCCV-->maintaining sinus on amiodarone;  b. CHA2DS2VASc = 5-->eliquis.   Parkinson's disease    tremors   Pleural effusion, right    a. 09/2017 s/p thoracentesis.   PNA (pneumonia) 08/26/2017   Pulmonary embolism ([place] 2011   Pulmonary fibrosis ([place]    Secondary erythrocytosis 01/28/2015   Sleep apnea    wears CPAP   Squamous cell carcinoma of skin 03/19/2015   Right lateral crown. KA-like pattern   Thrombocytopenia ([place]     Past Surgical History: Past Surgical History:  Procedure Laterality Date   BACK SURGERY  1960   CARDIAC CATHETERIZATION     CATARACT EXTRACTION Right    CATARACT EXTRACTION Bilateral    CHOLECYSTECTOMY  2010   COLONOSCOPY WITH PROPOFOL N/A 06/07/2018   Procedure: COLONOSCOPY WITH PROPOFOL;  Surgeon: SLollie Sails MD;  [facility];  Service: Endoscopy;  Laterality: N/A;   CORONARY ARTERY BYPASS GRAFT  01/07/1997   CORONARY STENT INTERVENTION N/A 10/31/2016   Procedure: Coronary Stent Intervention;  Surgeon: AWellington Hampshire MD;  [facility];  Service: Cardiovascular;  Laterality: N/A;   ELECTROPHYSIOLOGIC STUDY N/A 07/14/2015   Procedure: CARDIOVERSION;  Surgeon: DYolonda Kida MD;  Location: [facility] ORS;  Service: Cardiovascular;  Laterality: N/A;  ELECTROPHYSIOLOGIC STUDY N/A 10/12/2015   Procedure: CARDIOVERSION;  Surgeon: Minna Merritts, MD;  Location: [facility] ORS;  Service: Cardiovascular;  Laterality: N/A;   LEFT HEART CATH AND CORONARY ANGIOGRAPHY N/A 10/31/2016   Procedure: Left Heart Cath and Coronary Angiography;  Surgeon: Wellington Hampshire, MD;  Location: [place] [facility];  Service: Cardiovascular;  Laterality: N/A;   OTHER SURGICAL HISTORY  1998   Bypass   RIGHT/LEFT HEART CATH AND CORONARY ANGIOGRAPHY N/A 09/18/2017   Procedure: RIGHT/LEFT HEART CATH AND CORONARY ANGIOGRAPHY;  Surgeon: Wellington Hampshire, MD;  Location: [place] [facility];  Service: Cardiovascular;  Laterality: N/A;    Allergies: Allergies as of 04/17/2022 - Review Complete 04/06/2022  Allergen Reaction Noted   Pravastatin Other (See Comments) 01/26/2015   Prednisone Other (See Comments) 01/26/2015    Medications: Current Meds  Medication Sig   buPROPion (WELLBUTRIN XL) 300 MG 24 hr tablet TAKE 1 TABLET BY MOUTH DAILY   carbidopa-levodopa (SINEMET IR) 25-250 MG tablet Take 0.5 tablets by mouth 4 (four) times daily.   ELIQUIS 5 MG TABS tablet TAKE ONE TABLET BY MOUTH TWICE DAILY   entacapone (COMTAN) 200 MG tablet Take 1 tablet (200 mg total) by mouth 3 (three) times daily.   esomeprazole (NEXIUM) 40 MG capsule Take 1 capsule (40 mg total) by mouth daily.   fluticasone (FLONASE) 50 MCG/ACT nasal spray USE 2 PUFFS IN EACH NOSTRIL DAILY (Patient taking differently: Place 2 sprays into both nostrils daily.)   furosemide (LASIX) 40 MG tablet TAKE 1 TABLET BY MOUTH DAILY   levothyroxine (SYNTHROID) 50 MCG tablet Take 1 tablet (50 mcg total) by mouth daily before breakfast. TAKE ONE TABLET ON AN EMPTY STOMACH WITH A GLASS OF WATER AT LEAST 30 TO 60 MINUTES BEFORE BREAKFAST.   metoprolol tartrate (LOPRESSOR) 25 MG tablet TAKE ONE TABLET BY MOUTH TWICE DAILY   mirabegron ER (MYRBETRIQ) 25 MG TB24 tablet Take 1 tablet (25 mg total) by mouth daily.   rosuvastatin (CRESTOR) 10 MG tablet Take 1 tablet (10 mg total) by mouth daily.   sertraline (ZOLOFT) 50 MG tablet TAKE 1 TABLET BY MOUTH DAILY   STIOLTO RESPIMAT 2.5-2.5 MCG/ACT AERS     Social History: Social History   Tobacco Use   Smoking status: Former    Packs/day: 1.00    Years: 10.00    Total pack years: 10.00    Types: Cigarettes, Pipe, Cigars    Quit date: 04/18/1972    Years since quitting: 49.9   Smokeless tobacco: Former    Types: Chew    Quit date: 04/18/1972  Vaping Use   Vaping Use: Never used  Substance Use Topics   Alcohol use: Yes     Alcohol/week: 4.0 standard drinks of alcohol    Types: 2 Cans of beer, 2 Shots of liquor per week    Comment: per 2 weeks    Drug use: No    Family Medical History: Family History  Problem Relation Age of Onset   Alcohol abuse Father    Parkinson's disease Neg Hx     Physical Examination: Vitals:   04/05/22 0445 04/05/22 0626  BP: (!) 155/75 102/79  Pulse: 81 80  Resp: 18 20  Temp:    SpO2: 97% 95%    General: Patient is well developed, well nourished. He is agitated.  Neck:   Supple.  Full range of motion.  Respiratory: Patient is breathing without any difficulty.   NEUROLOGICAL:     Opens eyes  to stimulation.  He does speak but only when stimulated.  He does not answer orientation questions.    Cranial Nerves: Pupils equal round and reactive to light.  Facial tone is symmetric.  He does not cooperate with tongue protrusion.  The patient is moving all extremities symmetrically but does not follow commands.  He briskly localizes with his upper extremities.  Gait is untested.     Medical Decision Making  Imaging: CT Head 04/05/2022 IMPRESSION: 1. Stable acute left 14 mm subdural hematoma extending along the falx ceribri and tentorium. Stable 3 mm left-to-right midline shift. 2.   Stable acute 3 mm right subdural hematoma. 3. No new acute intracranial abnormality.     Electronically Signed   By: Iven Finn M.D.   On: 04/05/2022 03:12  I have personally reviewed the images and agree with the above interpretation.  Assessment and Plan: Andre [name] is a pleasant 86 y.o. male with acute subdural hematoma on Eliquis.  He has been reversed.  He is currently agitated.  I do not think he is a surgical candidate for resection of the subdural hematoma given his underlying dementia.  I have recommended conservative management with medical management of his agitation.  He is safe to work with physical therapy and Occupational Therapy once he is felt to be appropriate  to do so.  I would not recommend further scans at this point.  Will arrange follow-up for him in 3 to 4 weeks with a repeat CT scan.  I would recommend leaving him off of anticoagulation for at least 2 weeks.  He may not be a candidate for ongoing anticoagulation given his history of falls, but that discussion can be deferred to his primary care physician or cardiologist as appropriate.  I would not recommend antiepileptic prophylaxis given the risk for worsening his agitation.  Thank you for involving me in the care of this patient.      Weslee Fogg K. Izora Ribas MD, [facility] Neurosurgery

## 2022-04-05 NOTE — ED Notes (Signed)
This RN and Caryl Pina, RN to bedside to change pt's brief. Pt's brief saturated with urine. Linens, chucks and brief changed. Pericare provided, mepilex changed, pt repositioned with pillows. Warm blankets provided.

## 2022-04-05 NOTE — ED Notes (Signed)
Messaged MD about pt not receiving morning meds because of confusion/aspiration risk. Also mentioned pt's heart healthy diet with the same concerns. Asked if we can get a speech/swallow consult.

## 2022-04-05 NOTE — Hospital Course (Signed)
Andre [name] is a 86 y.o. male with medical history significant for community dwelling white male, lives with his wife with history of CAD s/p CABG, paroxysmal A-fib on Eliquis,, parkinsonism with Parkinson's dementia, OSA on CPAP, HFpEF and frequent falls who was brought to the ED with concerns for increased confusion and agitation that has been ongoing but worse over the past week.  CT scan showed a left frontal acute subdural hematoma measuring 1.1 x 5.6 x 3.2 cm.  There is also subdural hemorrhage along the falx cerebri and right tentorium. There is minimal left-to-right midline shift.  Patient is evaluated by neurosurgery, no intervention is recommended.

## 2022-04-05 NOTE — Progress Notes (Addendum)
  Progress Note   Patient: Andre [name] DOB: [birth year] DOA: 04/01/2022     1 DOS: the patient was seen and examined on 04/05/2022   Brief hospital course: Andre [name] is a 86 y.o. male with medical history significant for community dwelling white male, lives with his wife with history of CAD s/p CABG, paroxysmal A-fib on Eliquis,, parkinsonism with Parkinson's dementia, OSA on CPAP, HFpEF and frequent falls who was brought to the ED with concerns for increased confusion and agitation that has been ongoing but worse over the past week.  CT scan showed a left frontal acute subdural hematoma measuring 1.1 x 5.6 x 3.2 cm.  There is also subdural hemorrhage along the falx cerebri and right tentorium. There is minimal left-to-right midline shift.  Patient is evaluated by neurosurgery, no intervention is recommended.  Assessment and Plan: * Subdural hematoma (HCC) Chronic anticoagulation PAF Frequent falls Eliquis reversed with Kcentra.  Discontinue Eliquis Repeated CT scan this morning showed stable subdural hematoma.  Discussed with Dr. Izora Ribas, no need for additional intervention.  Continue to follow.  Chronic diastolic CHF (congestive heart failure) ([place]) Patient does not have any exacerbation  Low-grade fever. Likely  secondary to intracranial hemorrhage.  Will check respiratory panel, including influenza and COVID.  Hypertensive urgency Continue metoprolol, add amlodipine.  Delirium due to multiple etiologies, acute, hyperactive Parkinson's dementia Slurred speech Continue Sinemet.  Patient has significant slurred speech, appears to be chronic. Continue Haldol as needed.  COPD (chronic obstructive pulmonary disease) (HCC) No exacerbation, continue bronchodilator.  Coronary artery disease involving native coronary artery of native heart with angina pectoris (HCC) Troponin normal.  Continue to follow.  Continue beta-blocker.  OSA on CPAP CPAP nightly        Subjective:  Patient has slurred speech and confusion.  No shortness of breath.  Physical Exam: Vitals:   04/05/22 1039 04/05/22 1130 04/05/22 1316 04/05/22 1330  BP: 131/79 (!) 177/65 (!) 147/71 (!) 147/71  Pulse:   (!) 103 (!) 103  Resp:   19 19  Temp:    100.3 F (37.9 C)  TempSrc:    Axillary  SpO2:   90% 91%  Weight:      Height:       General exam: Appears calm and comfortable  Respiratory system: Clear to auscultation. Respiratory effort normal. Cardiovascular system: S1 & S2 heard, RRR. No JVD, murmurs, rubs, gallops or clicks. No pedal edema. Gastrointestinal system: Abdomen is nondistended, soft and nontender. No organomegaly or masses felt. Normal bowel sounds heard. Central nervous system: Alert and confused, speech slurred.  Extremities: Symmetric 5 x 5 power. Skin: No rashes, lesions or ulcers   Data Reviewed:  CT head results, lab results.  Family Communication:   Disposition: Status is: Inpatient Remains inpatient appropriate because: Severity of disease, altered mental status.  Planned Discharge Destination: Home with Home Health    Time spent: 35 minutes, no charge  Author: Sharen Hones, MD 04/05/2022 2:08 PM  For on call review [email or web address].

## 2022-04-05 NOTE — ED Notes (Signed)
Previous shift RN, Water quality scientist, RN states pt's cardiac monitoring removed this AM d/t pt. Restlessness and not tolerating monitoring leads etc. Nira Conn states MD is aware pt. Is not currently being cardiac monitored.

## 2022-04-05 NOTE — Assessment & Plan Note (Addendum)
Chronic anticoagulation Frequent falls Eliquis reversed with Kcentra.  Discontinue Eliquis Per consultation with neurosurgeon and ED provider: Not a candidate for surgical intervention BP control to maintain systolic under [id number] Cardene for now Follow-up repeat CT head at 3 AM Neurologic checks Fall precautions Neurosurgery consult to follow
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
## 2022-04-05 NOTE — TOC Initial Note (Signed)
Transition of Care [facility]) - Initial/Assessment Note    Patient Details  Name: Andre [name] MRN: [id number] Date of Birth: [birth year]  Transition of Care [facility]) CM/SW Contact:    Shelbie Hutching, RN Phone Number: 04/05/2022, 11:43 AM  Clinical Narrative:                 Patient admitted with intercranial hemorrhage, from home with wife, history of dementia and parkinson's disease.  Wife is unable to manage patient at home.  PT and OT attempted to work with patient this morning.  Likely recommendation will be long term skilled nursing care.   TOC will follow.    Expected Discharge Plan: Skilled Nursing Facility Barriers to Discharge: Continued Medical Work up   Patient Goals and CMS Choice   CMS Medicare.gov Compare Post Acute Care list provided to:: Patient Represenative (must comment) Choice offered to / list presented to : Spouse  Expected Discharge Plan and Services Expected Discharge Plan: [place]   Discharge Planning Services: CM Consult Post Acute Care Choice: [place] Living arrangements for the past 2 months: Single Family Home                                      Prior Living Arrangements/Services Living arrangements for the past 2 months: Single Family Home Lives with:: Spouse Patient language and need for interpreter reviewed:: Yes Do you feel safe going back to the place where you live?: No   wife cannot care for patient at home  Need for Family Participation in Patient Care: Yes (Comment) Care giver support system in place?: Yes (comment)   Criminal Activity/Legal Involvement Pertinent to Current Situation/Hospitalization: No - Comment as needed  Activities of Daily Living      Permission Sought/Granted      Share Information with NAME: Andre [name]     Permission granted to share info w Relationship: spouse  Permission granted to share info w Contact Information: [phone]  Emotional  Assessment Appearance:: Appears stated age Attitude/Demeanor/Rapport: Unable to Assess Affect (typically observed): Agitated   Alcohol / Substance Use: Not Applicable Psych Involvement: No (comment)  Admission diagnosis:  Subdural hematoma ([place]) [S06.5XAA] Patient Active Problem List   Diagnosis Date Noted   Frequent falls 04/05/2022   Chronic anticoagulation 04/05/2022   Hypertensive urgency 04/05/2022   Chronic diastolic CHF (congestive heart failure) ([place]) 04/05/2022   SDH (subdural hematoma) ([place]) 04/05/2022   Subdural hematoma ([place]) 04/14/2022   Dizziness 10/12/2021   Bruising 10/12/2021   Dementia associated with Parkinson's disease ([place]) 07/07/2021   BPH (benign prostatic hyperplasia) 05/11/2021   Delirium due to multiple etiologies, acute, hyperactive 05/11/2021   Acute decompensated heart failure ([place]) 04/12/2021   Acute exacerbation of CHF (congestive heart failure) ([place]) 04/11/2021   Declining functional status 04/30/2020   Slurred speech 04/20/2020   Hypersomnia 04/20/2020   Gait abnormality 07/31/2019   Pulmonary fibrosis ([place]) 07/05/2019   COPD (chronic obstructive pulmonary disease) ([place]) 07/09/2018   Neck pain 03/09/2018   Myalgia 03/09/2018   Decreased hearing of both ears 03/09/2018   Cervical facet syndrome 01/22/2018   Renal cyst 10/02/2017   Pleural effusion 09/18/2017   Diarrhea 08/21/2017   Anemia 08/21/2017   Renal lesion 07/08/2017   Fall 05/29/2017   Abrasion 02/08/2017   Anxiety and depression 01/05/2017   Prediabetes 10/27/2016   Hypothyroidism 10/27/2016   (HFpEF) heart failure with preserved  ejection fraction ([place]) 09/27/2016   Hypothyroidism due to medication 09/07/2016   Parkinson's disease 06/30/2016   Thrombocytopenia ([place]) 01/29/2016   PAF (paroxysmal atrial fibrillation) (HCC)    BMI 40.0-44.9, adult ([place]) 05/06/2015   Erythrocytosis 01/28/2015   Atherosclerosis of abdominal aorta ([place]) 12/08/2014   Barrett's esophagus 12/31/2013    DDD (degenerative disc disease), cervical 10/01/2013   DDD (degenerative disc disease), lumbar 10/01/2013   Coronary artery disease involving native coronary artery of native heart with angina pectoris ([place]) 10/30/2012   GERD (gastroesophageal reflux disease) 10/30/2012   Hyperlipidemia with target LDL less than 70 10/30/2012   Essential hypertension 10/30/2012   Dyspnea 09/10/2012   Allergic rhinitis 09/10/2012   OSA on CPAP 09/10/2012   PCP:  [name], MD Pharmacy:   [place], [place] - [address] [place] [address] Phone: [phone] Fax: [phone]  [place], [place] [address] [place] [address] Phone: [phone] Fax: [phone]  [id number] [place]Phone: [phone]: [phone]    Social Determinants of Health (SDOH) Interventions    Readmission Risk Interventions     No data to display

## 2022-04-05 NOTE — Assessment & Plan Note (Signed)
Parkinson's dementia Haldol as needed Delirium precautions

## 2022-04-05 NOTE — Assessment & Plan Note (Signed)
Clinically euvolemic Continue furosemide, losartan and metoprolol
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
## 2022-04-05 NOTE — Assessment & Plan Note (Signed)
Appears subacute to chronic per discussion with wife No acute findings on CT head to suggest prior CVA Continue neurologic checks

## 2022-04-05 NOTE — Assessment & Plan Note (Signed)
Continue Sinemet and Comtan

## 2022-04-05 NOTE — ED Notes (Signed)
This RN and Caryl Pina, RN to bedside to change pt's brief. Pt. Has medium sized bowel movement of soft formed stool. Barrier cream applied to groin for rash to bilat groin. Peri care provided. New chucks, brief and underwear provided.

## 2022-04-05 NOTE — ED Notes (Signed)
Wife at bedside.

## 2022-04-05 NOTE — Assessment & Plan Note (Signed)
CPAP nightly

## 2022-04-05 NOTE — ED Notes (Signed)
Pt very agitated, rolling in bed and swinging at staff when attempt to provide any care.

## 2022-04-05 NOTE — ED Notes (Signed)
Pt currently dry, was aided in readjusting in bed by this nurse and Tracy,[employer]. Pt is rolling around bed and reaching places since start of shift and reported through daytime.

## 2022-04-05 NOTE — ED Notes (Signed)
  please give pills CRUSHED in Puree for safer swallowing.  FULL assistance w/ feeding at meals -- check for oral clearing and only give po's when calm and attending to task.  Reduce distractions during oral intake.  Per speech

## 2022-04-05 NOTE — Assessment & Plan Note (Signed)
Systolic over 681 on arrival Was placed on Cardene from the ED to maintain systolic under 275 Continue to titrate

## 2022-04-05 NOTE — ED Notes (Signed)
This RN seated at bedside, assisting pt. In feeding. Pt. Ate complete magic cup, 25% of beef and 10% of mashed potatoes. Pt. Refused further food/drink. Pt. Ate pureed food without complication.

## 2022-04-05 NOTE — ED Notes (Signed)
Medications were crushed and mixed with magic cup, pt ate entire cup. Pt tolerated well. Required to be fed

## 2022-04-05 NOTE — Assessment & Plan Note (Signed)
Troponin 11 and EKG nonacute Continue metoprolol.  Continue rosuvastatin Eliquis discontinued.  Consider aspirin to replace Eliquis at discharge

## 2022-04-06 DIAGNOSIS — S065XAA Traumatic subdural hemorrhage with loss of consciousness status unknown, initial encounter: Secondary | ICD-10-CM | POA: Diagnosis not present

## 2022-04-06 LAB — CBG MONITORING, ED: Glucose-Capillary: 127 mg/dL — ABNORMAL HIGH (ref 70–99)

## 2022-04-06 LAB — RESPIRATORY PANEL BY PCR

## 2022-04-06 MED ORDER — NEPRO/CARBSTEADY PO LIQD
237.0000 mL | Freq: Three times a day (TID) | ORAL | Status: DC
  Administered 2022-04-09 – 2022-04-18 (×19): 237 mL via ORAL

## 2022-04-06 NOTE — ED Notes (Signed)
After aiding pt in repositioning he has been asleep, resp even and unlabored.

## 2022-04-06 NOTE — ED Notes (Signed)
Pt wife called gave her an update on pt status.

## 2022-04-06 NOTE — ED Notes (Signed)
Patient's brief noted to be wet. Patient cleaned and placed in clean brief with new chuck pad. Repositioned in bed at this time. Breakfast tray fed to patient. Patient ate approx 50% of apple sauce, 100% of orange juice and 3-4 bites of sausage and pancakes.

## 2022-04-06 NOTE — ED Notes (Signed)
Complete bed change for pt, pt cleaned at this time. Clean blanket over pt but pt does not keep it on him due to restlessness

## 2022-04-06 NOTE — ED Notes (Signed)
Patient fed lunch tray. Patient ate 50% of mac and cheese, 50% of magic cup, 15% of chicken, and 50% of thickened water.

## 2022-04-06 NOTE — ED Notes (Signed)
Pt cleaned, urinated in brief. Clean brief applied. Difficult to change as pt cannot follow commands. Pt adjusted in bed and HOB lowered as pt appeared more comfortable to lay on side this way.   When changing, pt has redness noted to creases. Cleaned well by this nurse and [facility]. Barrier cream applied to protect skin

## 2022-04-06 NOTE — ED Notes (Signed)
Patient resting comfortably with eyes closed. Respirations even and unlabored with NAD noted at this time.

## 2022-04-06 NOTE — Progress Notes (Signed)
SLP follow up  Note  Patient Details Name: Andre [name] MRN: [id number] DOB: [birth year]   SLP observed pt's nurse feeding him his breakfast. He consumed apple sauce and nectar thick liquids without overt s/s of aspiration. She nurse states that after consuming ~ 50% of applesauce, he began to hold it in his mouth and fall asleep. At this time, current diet appears appropriate although limited by pt's current cognitive state. ST intervention is not appropriate that this time. ST will sign off.            [name] B. Rutherford Nail, M.S., CCC-SLP, Mining engineer Certified Brain Injury [place]  [place] Office [phone] Ascom [phone] Fax [phone]
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
## 2022-04-06 NOTE — ED Notes (Signed)
Patient fed meal tray. Patient ate approx 50% of mash potatoes, 50% of [place], 50% of magic cup, and 100% of thickened tea.   Patient also repositioned in bed at this time.

## 2022-04-06 NOTE — Progress Notes (Signed)
PROGRESS NOTE    Andre [name]  RDE:[id number] DOB: 24-Feb-1936 DOA: 03/29/2022 PCP: [name], MD    Brief Narrative:  This 86 yrs old community dwelling white male with PMH significant for CAD s/p CABG, paroxysmal A-fib on Eliquis,, parkinsonism with Parkinson's dementia, OSA on CPAP, HFpEF and frequent falls who was brought to the ED with concerns for increased confusion and agitation that has been ongoing but worse over the past week.  Wife reports patient has been falling and last fell 2 days ago,  hitting his head.  CT head showed an acute subdural hemorrhage with minimal left-to-right midline shift.  Patient was evaluated by neurosurgery Dr. Cari Caraway who is stated patient is not a surgical candidate given his dementia and frailty.  Patient was given Kcentra for Eliquis reversal.  Patient is started on Cardene drip for hypertension.  Repeat CT head shows stable subdural hematoma..  Assessment & Plan:   Principal Problem:   Subdural hematoma (HCC) Active Problems:   OSA on CPAP   PAF (paroxysmal atrial fibrillation) (HCC)   Thrombocytopenia (HCC)   Parkinson's disease   Coronary artery disease involving native coronary artery of native heart with angina pectoris (HCC)   Essential hypertension   Hypothyroidism   COPD (chronic obstructive pulmonary disease) (HCC)   Slurred speech   Delirium due to multiple etiologies, acute, hyperactive   Dementia associated with Parkinson's disease ([facility])   Frequent falls   Chronic anticoagulation   Hypertensive urgency   Chronic diastolic CHF (congestive heart failure) (HCC)   SDH (subdural hematoma) (HCC)   Subdural hematoma in the setting of chronic anticoagulation and recurrent falls. Eliquis reversed with Kcentra.  Discontinue Eliquis Neurosurgery evaluated the patient.  Not a candidate for surgical intervention Blood pressure control to maintain systolic under 631. Repeat CT head shows stable subdural hematoma. Continue  neurologic checks Fall precautions Not a surgical candidate for resection of central hematoma given his underlying dementia.  Recommended conservative management with medical management of his agitation.  Continue PT and OT eval.  No further scans. Repeat CT head in 3 to 4 weeks.   Chronic Diastolic CHF: Appears euvolemic. Continue furosemide, losartan and metoprolol   Hypertensive urgency: Patient presented with systolic BP above 497 on arrival. Started on Cardene drip for maintaining systolic BP below 026. Continue metoprolol 25 mg every 12 hours, amlodipine 2.5 mg daily   Delirium due to multiple etiologies. Parkinson's dementia: Continue Haldol as needed. Delirium precautions  Slurred Speech: Appears subacute to chronic per discussion with wife. No acute findings on CT head to suggest CVA. Frequent neurochecks.   COPD: No acute exacerbation. Continue Stiolto Respimat with as needed albuterol.   CAD: Troponin 11 and EKG nonacute. Continue metoprolol.  Continue rosuvastatin,  Eliquis discontinued.   Consider aspirin to replace Eliquis at discharge   Parkinson's disease: Continue Sinemet and Comtan.  Obstructive sleep apnea on CPAP: Continue CPAP nightly   DVT prophylaxis: SCDs Code Status: DNR Family Communication: No family at bed side. Disposition Plan:   Status is: Inpatient Remains inpatient appropriate because: Admitted for altered mentation secondary to subdural hematoma.  Repeat CT head shows stable hematoma.  Neurosurgery no surgical intervention.   Consultants:  Neurosurgery  Procedures: Repeat CT head Antimicrobials: None.  Subjective: Patient was seen and examined at bedside.  Overnight events noted. Patient appears at his baseline.  Following commands but does not answer questions appropriately.  Objective: Vitals:   04/06/22 0752 04/06/22 1024 04/06/22 1158 04/06/22 1337  BP:  [name])  154/78  (!) 133/90  Pulse:  70  60  Resp:  19  18  Temp:  99.4 F (37.4 C)  98.9 F (37.2 C)   TempSrc: Axillary  Axillary   SpO2:  97%  97%  Weight:      Height:       No intake or output data in the 24 hours ending 04/06/22 1556 Filed Weights   04/05/2022 1932  Weight: 94 kg    Examination:  General exam: Appears comfortable, deconditioned, not in any acute distress. Respiratory system: Clear to auscultation. Respiratory effort normal. Cardiovascular system: S1 & S2 heard, regular rate and rhythm, no murmur. Gastrointestinal system: Abdomen is soft, non distended, non tender, BS+ Central nervous system: Alert and oriented x 1.  Confused with slurred speech. Extremities: Symmetric 5 x 5 power. Skin: No rashes, lesions or ulcers Psychiatry:Not assessed.    Data Reviewed: I have personally reviewed following labs and imaging studies  CBC: Recent Labs  Lab 03/24/2022 1936 04/05/22 0439  WBC 15.5* 14.4*  HGB 15.8 14.7  HCT 48.9 45.0  MCV 92.3 91.3  PLT 141* 979*   Basic Metabolic Panel: Recent Labs  Lab 03/25/2022 1936  NA 138  K 4.2  CL 101  CO2 27  GLUCOSE 115*  BUN 19  CREATININE 0.84  CALCIUM 9.3   GFR: Estimated Creatinine Clearance: 71.6 mL/min (by C-G formula based on SCr of 0.84 mg/dL). Liver Function Tests: No results for input(s): "AST", "ALT", "ALKPHOS", "BILITOT", "PROT", "ALBUMIN" in the last 168 hours. No results for input(s): "LIPASE", "AMYLASE" in the last 168 hours. No results for input(s): "AMMONIA" in the last 168 hours. Coagulation Profile: Recent Labs  Lab 04/06/2022 1936  INR 1.3*   Cardiac Enzymes: No results for input(s): "CKTOTAL", "CKMB", "CKMBINDEX", "TROPONINI" in the last 168 hours. BNP (last 3 results) No results for input(s): "PROBNP" in the last 8760 hours. HbA1C: No results for input(s): "HGBA1C" in the last 72 hours. CBG: Recent Labs  Lab 04/05/22 0451 04/06/22 0739  GLUCAP 119* 127*   Lipid Profile: No results for input(s): "CHOL", "HDL", "LDLCALC", "TRIG", "CHOLHDL",  "LDLDIRECT" in the last 72 hours. Thyroid Function Tests: No results for input(s): "TSH", "T4TOTAL", "FREET4", "T3FREE", "THYROIDAB" in the last 72 hours. Anemia Panel: No results for input(s): "VITAMINB12", "FOLATE", "FERRITIN", "TIBC", "IRON", "RETICCTPCT" in the last 72 hours. Sepsis Labs: No results for input(s): "PROCALCITON", "LATICACIDVEN" in the last 168 hours.  Recent Results (from the past 240 hour(s))  Resp panel by RT-PCR (RSV, Flu A&B, Covid) Anterior Nasal Swab     Status: None   Collection Time: 04/05/22  2:07 PM   Specimen: Anterior Nasal Swab  Result Value Ref Range Status   SARS Coronavirus 2 by RT PCR NEGATIVE NEGATIVE Final    Comment: (NOTE) SARS-CoV-2 target nucleic acids are NOT DETECTED.  The SARS-CoV-2 RNA is generally detectable in upper respiratory specimens during the acute phase of infection. The lowest concentration of SARS-CoV-2 viral copies this assay can detect is 138 copies/mL. A negative result does not preclude SARS-Cov-2 infection and should not be used as the sole basis for treatment or other patient management decisions. A negative result may occur with  improper specimen collection/handling, submission of specimen other than nasopharyngeal swab, presence of viral mutation(s) within the areas targeted by this assay, and inadequate number of viral copies(<138 copies/mL). A negative result must be combined with clinical observations, patient history, and epidemiological information. The expected result is Negative.  Fact Sheet for Patients:  EntrepreneurPulse.com.au  Fact Sheet for Healthcare Providers:  IncredibleEmployment.be  This test is no t yet approved or cleared by the [place] FDA and  has been authorized for detection and/or diagnosis of SARS-CoV-2 by FDA under an Emergency Use Authorization (EUA). This EUA will remain  in effect (meaning this test can be used) for the duration of  the COVID-19 declaration under Section 564(b)(1) of the Act, 21 U.S.C.section 360bbb-3(b)(1), unless the authorization is terminated  or revoked sooner.       Influenza A by PCR NEGATIVE NEGATIVE Final   Influenza B by PCR NEGATIVE NEGATIVE Final    Comment: (NOTE) The Xpert Xpress SARS-CoV-2/FLU/RSV plus assay is intended as an aid in the diagnosis of influenza from Nasopharyngeal swab specimens and should not be used as a sole basis for treatment. Nasal washings and aspirates are unacceptable for Xpert Xpress SARS-CoV-2/FLU/RSV testing.  Fact Sheet for Patients: EntrepreneurPulse.com.au  Fact Sheet for Healthcare Providers: IncredibleEmployment.be  This test is not yet approved or cleared by the [place] FDA and has been authorized for detection and/or diagnosis of SARS-CoV-2 by FDA under an Emergency Use Authorization (EUA). This EUA will remain in effect (meaning this test can be used) for the duration of the COVID-19 declaration under Section 564(b)(1) of the Act, 21 U.S.C. section 360bbb-3(b)(1), unless the authorization is terminated or revoked.     Resp Syncytial Virus by PCR NEGATIVE NEGATIVE Final    Comment: (NOTE) Fact Sheet for Patients: EntrepreneurPulse.com.au  Fact Sheet for Healthcare Providers: IncredibleEmployment.be  This test is not yet approved or cleared by the [place] FDA and has been authorized for detection and/or diagnosis of SARS-CoV-2 by FDA under an Emergency Use Authorization (EUA). This EUA will remain in effect (meaning this test can be used) for the duration of the COVID-19 declaration under Section 564(b)(1) of the Act, 21 U.S.C. section 360bbb-3(b)(1), unless the authorization is terminated or revoked.  Performed at [facility], [place], [place] [address]   Respiratory (~20 pathogens) panel by PCR     Status: None    Collection Time: 04/05/22  2:07 PM   Specimen: Nasopharyngeal Swab; Respiratory  Result Value Ref Range Status   Adenovirus NOT DETECTED NOT DETECTED Final   Coronavirus 229E NOT DETECTED NOT DETECTED Final    Comment: (NOTE) The Coronavirus on the Respiratory Panel, DOES NOT test for the novel  Coronavirus (2019 nCoV)    Coronavirus HKU1 NOT DETECTED NOT DETECTED Final   Coronavirus NL63 NOT DETECTED NOT DETECTED Final   Coronavirus OC43 NOT DETECTED NOT DETECTED Final   Metapneumovirus NOT DETECTED NOT DETECTED Final   Rhinovirus / Enterovirus NOT DETECTED NOT DETECTED Final   Influenza A NOT DETECTED NOT DETECTED Final   Influenza B NOT DETECTED NOT DETECTED Final   Parainfluenza Virus 1 NOT DETECTED NOT DETECTED Final   Parainfluenza Virus 2 NOT DETECTED NOT DETECTED Final   Parainfluenza Virus 3 NOT DETECTED NOT DETECTED Final   Parainfluenza Virus 4 NOT DETECTED NOT DETECTED Final   Respiratory Syncytial Virus NOT DETECTED NOT DETECTED Final   Bordetella pertussis NOT DETECTED NOT DETECTED Final   Bordetella Parapertussis NOT DETECTED NOT DETECTED Final   Chlamydophila pneumoniae NOT DETECTED NOT DETECTED Final   Mycoplasma pneumoniae NOT DETECTED NOT DETECTED Final    Comment: Performed at [facility], [place]. [address], [place], [place] [address]    Radiology Studies: CT Head Wo Contrast  Result Date: 04/05/2022 CLINICAL DATA:  rpt ich.  Follow-up bleed EXAM: CT HEAD WITHOUT CONTRAST TECHNIQUE: Contiguous axial images were obtained from the base of the skull through the vertex without intravenous contrast. RADIATION DOSE REDUCTION: This exam was performed according to the departmental dose-optimization program which includes automated exposure control, adjustment of the mA and/or kV according to patient size and/or use of iterative reconstruction technique. COMPARISON:  CT head 04/05/2022 FINDINGS: Brain: Cerebral ventricle sizes are concordant with the degree of cerebral  volume loss. Patchy and confluent areas of decreased attenuation are noted throughout the deep and periventricular white matter of the cerebral hemispheres bilaterally, compatible with chronic microvascular ischemic disease. T no evidence of large-territorial acute infarction. No parenchymal hemorrhage. No mass lesion. Acute left calvarial convexity subdural hematoma measuring 14 mm with extension along the falx cerebral and tentorium. Stable acute 3 mm right subdural hematoma. No new acute extra-axial collection. No mass effect. Stable 3 mm left-to-right midline shift. No hydrocephalus. Basilar cisterns are patent. Vascular: No hyperdense vessel. Skull: No acute fracture or focal lesion. Sinuses/Orbits: Paranasal sinuses and mastoid air cells are clear. Bilateral lens replacement. Otherwise the orbits are unremarkable. Other: None. IMPRESSION: 1. Stable acute left 14 mm subdural hematoma extending along the falx ceribri and tentorium. Stable 3 mm left-to-right midline shift. 2.   Stable acute 3 mm right subdural hematoma. 3. No new acute intracranial abnormality. Electronically Signed   By: Iven Finn M.D.   On: 04/05/2022 03:12   Portable Chest 1 View  Result Date: 04/05/2022 CLINICAL DATA:  Fall EXAM: PORTABLE CHEST 1 VIEW COMPARISON:  03/25/2022 FINDINGS: Mild patchy right lung opacities with volume loss, favoring atelectasis. Cardiomegaly with mild perihilar edema. No definite pleural effusions. No pneumothorax. Postsurgical changes related to prior CABG. IMPRESSION: Mild patchy right lung opacities with volume loss, favoring atelectasis. Cardiomegaly with mild perihilar edema. Electronically Signed   By: Julian Hy M.D.   On: 04/05/2022 01:22   CT Head Wo Contrast  Result Date: 04/05/2022 CLINICAL DATA:  Mental status change. EXAM: CT HEAD WITHOUT CONTRAST TECHNIQUE: Contiguous axial images were obtained from the base of the skull through the vertex without intravenous contrast. RADIATION  DOSE REDUCTION: This exam was performed according to the departmental dose-optimization program which includes automated exposure control, adjustment of the mA and/or kV according to patient size and/or use of iterative reconstruction technique. COMPARISON:  CT examination dated May 11, 2021 FINDINGS: Brain: Left frontal subdural hemorrhage measuring a proximally 1.1 x 5.6 by 3.2 cm. There is also subdural hemorrhage along the falx cerebri and right tentorium. There is minimal left-to-right midline shift, evaluation is however somewhat limited due to motion. Moderate generalized cerebral atrophy and chronic microvascular ischemic changes of the white matter, unchanged. Vascular: No hyperdense vessel or unexpected calcification. Skull: Normal. Negative for fracture or focal lesion. Sinuses/Orbits: No acute finding. Other: None. IMPRESSION: 1. Left frontal acute subdural hemorrhage measuring 1.1 x 5.6 x 3.2 cm. There is also subdural hemorrhage along the falx cerebri and right tentorium. There is minimal left-to-right midline shift, evaluation is however somewhat limited due to motion. Short-term follow-up examination in 6 hours is recommended. 2. Moderate generalized cerebral atrophy and chronic microvascular ischemic changes of the white matter, unchanged. Above findings were reported to Dr. Beather Arbour at approximately 9:37 p.m. on 04/08/2022. Electronically Signed   By: Keane Police D.O.   On: 04/03/2022 21:37    Scheduled Meds:  amLODipine  2.5 mg Oral Daily   arformoterol  15 mcg Nebulization BID   And   umeclidinium bromide  1 puff Inhalation Daily   buPROPion  150 mg Oral BID   carbidopa-levodopa  0.5 tablet Oral QID   entacapone  200 mg Oral TID   famotidine  20 mg Oral BID   feeding supplement (NEPRO CARB STEADY)  237 mL Oral TID BM   furosemide  40 mg Oral Daily   levothyroxine  50 mcg Oral Q0600   metoprolol tartrate  25 mg Oral BID   rosuvastatin  10 mg Oral Daily   sertraline  50 mg Oral  Daily   sodium chloride flush  3 mL Intravenous Q12H   Continuous Infusions:   LOS: 2 days    Time spent: 50 mins    Rico Massar, MD [facility]   If 7PM-7AM, please contact night-coverage

## 2022-04-07 DIAGNOSIS — S065XAA Traumatic subdural hemorrhage with loss of consciousness status unknown, initial encounter: Secondary | ICD-10-CM | POA: Diagnosis not present

## 2022-04-07 LAB — CBC
HCT: 49.4 % (ref 39.0–52.0)
Hemoglobin: 16.1 g/dL (ref 13.0–17.0)
MCH: 29.9 pg (ref 26.0–34.0)
MCHC: 32.6 g/dL (ref 30.0–36.0)
MCV: 91.7 fL (ref 80.0–100.0)
Platelets: 155 10*3/uL (ref 150–400)
RBC: 5.39 MIL/uL (ref 4.22–5.81)
RDW: 14.1 % (ref 11.5–15.5)
WBC: 14.6 10*3/uL — ABNORMAL HIGH (ref 4.0–10.5)
nRBC: 0 % (ref 0.0–0.2)

## 2022-04-07 LAB — GLUCOSE, CAPILLARY
Glucose-Capillary: 117 mg/dL — ABNORMAL HIGH (ref 70–99)
Glucose-Capillary: 137 mg/dL — ABNORMAL HIGH (ref 70–99)

## 2022-04-07 NOTE — Progress Notes (Signed)
Occupational Therapy Treatment Patient Details Name: Andre [name] MRN: [id number] DOB: 30-May-1935 Today's Date: 04/07/2022   History of present illness presented to ER from home secondary to worsening confusion, agitation; admitted for management of acute L frontal SDH (stable across imaging) with small (L to R) midline shift   OT comments  Andre [name] is more alert and better oriented than upon admission. He is able to provide his name and birthday, can correctly identify the year and state that he is at a hospital. He does not know why he is here and cannot identify the month. He is generally able to follow single-step directions with increased time. Pt is wearing mitts but does not attempt to pull on lines or clothing. He requires Mod A to come to EOB sitting, is able to maintain sitting balance w/o physical assistance and w/o UE support but with a strong R lateral lean and is unable to adjust/correct when given instructions to do so. Pt is able to follow visual cues for UE and LE seated therex, reporting after ~ 3 minutes that he needs to rest. Attempted sit<stand, but pt unable to come up into full upright stand. Pt returns from EOB sit to supine with CGA but no physical assist. He can follow directions to reposition in bed and to roll to L and R with Min A. Pt demonstrates improving cognition and mobility but remains deconditioned, weak, and with impaired balance. Continue to recommend DC to SNF.   Recommendations for follow up therapy are one component of a multi-disciplinary discharge planning process, led by the attending physician.  Recommendations may be updated based on patient status, additional functional criteria and insurance authorization.    Follow Up Recommendations  Skilled nursing-short term rehab (<3 hours/day)     Assistance Recommended at Discharge Frequent or constant Supervision/Assistance  Patient can return home with the following  A lot of help with walking and/or  transfers;A lot of help with bathing/dressing/bathroom;Assistance with cooking/housework;Assistance with feeding;Help with stairs or ramp for entrance;Assist for transportation;Direct supervision/assist for financial management;Direct supervision/assist for medications management   Equipment Recommendations  None recommended by OT    Recommendations for Other Services      Precautions / Restrictions Precautions Precautions: Fall Restrictions Weight Bearing Restrictions: No       Mobility Bed Mobility Overal bed mobility: Needs Assistance Bed Mobility: Supine to Sit, Sit to Supine     Supine to sit: Mod assist Sit to supine: Min guard        Transfers Overall transfer level: Needs assistance Equipment used: 1 person hand held assist Transfers: Sit to/from Stand Sit to Stand: Mod assist           General transfer comment: unable to come up into full standing position     Balance Overall balance assessment: Needs assistance Sitting-balance support: Feet unsupported, Single extremity supported Sitting balance-Leahy Scale: Fair   Postural control: Right lateral lean Standing balance support: Single extremity supported Standing balance-Leahy Scale: Poor                             ADL either performed or assessed with clinical judgement   ADL Overall ADL's : Needs assistance/impaired                                            Extremity/Trunk  Assessment Upper Extremity Assessment Upper Extremity Assessment: Overall WFL for tasks assessed   Lower Extremity Assessment Lower Extremity Assessment: Overall WFL for tasks assessed        Vision       Perception     Praxis      Cognition Arousal/Alertness: Awake/alert Behavior During Therapy: WFL for tasks assessed/performed Overall Cognitive Status: No family/caregiver present to determine baseline cognitive functioning                                 General  Comments: Able to provide name, state he is in a hospital, and identify year. Able to follow directions with increased time.        Exercises Other Exercises Other Exercises: cognitive re-orientation, UE and LE therex in sitting    Shoulder Instructions       General Comments      Pertinent Vitals/ Pain       Pain Assessment Pain Score: 0-No pain  Home Living                                          Prior Functioning/Environment              Frequency  Min 2X/week        Progress Toward Goals  OT Goals(current goals can now be found in the care plan section)  Progress towards OT goals: Progressing toward goals  Acute Rehab OT Goals OT Goal Formulation: Patient unable to participate in goal setting Time For Goal Achievement: 04/19/22 Potential to Achieve Goals: [place] Discharge plan remains appropriate;Frequency remains appropriate    Co-evaluation                 AM-PAC OT "6 Clicks" Daily Activity     Outcome Measure   Help from another person eating meals?: A Little Help from another person taking care of personal grooming?: A Little Help from another person toileting, which includes using toliet, bedpan, or urinal?: A Lot Help from another person bathing (including washing, rinsing, drying)?: A Lot Help from another person to put on and taking off regular upper body clothing?: A Little Help from another person to put on and taking off regular lower body clothing?: A Lot 6 Click Score: 15    End of Session    OT Visit Diagnosis: Unsteadiness on feet (R26.81);Repeated falls (R29.6);Muscle weakness (generalized) (M62.81)   Activity Tolerance Patient tolerated treatment well   Patient Left in bed;with bed alarm set;with call bell/phone within reach   Nurse Communication          Time: 0175-1025 OT Time Calculation (min): 12 min  Charges: OT General Charges $OT Visit: 1 Visit OT Treatments $Self Care/Home  Management : 8-22 mins Josiah Lobo, PhD, MS, OTR/L 04/07/22, 4:24 PM

## 2022-04-07 NOTE — Progress Notes (Signed)
PROGRESS NOTE    Andre [name]  ALP:[id number] DOB: 04/22/1935 DOA: 04/16/2022 PCP: [name], MD    Brief Narrative:  This 86 yrs old community dwelling white male with PMH significant for CAD s/p CABG, paroxysmal A-fib on Eliquis,, parkinsonism with Parkinson's dementia, OSA on CPAP, HFpEF and frequent falls who was brought to the ED with concerns for increased confusion and agitation that has been ongoing but worse over the past week.  Wife reports patient has been falling and last fell 2 days ago,  hitting his head.  CT head showed an acute subdural hemorrhage with minimal left-to-right midline shift.  Patient was evaluated by neurosurgery Dr. Cari Caraway who is stated patient is not a surgical candidate given his dementia and frailty.  Patient was given Kcentra for Eliquis reversal.  Patient is started on Cardene drip for hypertension.  Repeat CT head shows stable subdural hematoma..  Assessment & Plan:   Principal Problem:   Subdural hematoma (HCC) Active Problems:   OSA on CPAP   PAF (paroxysmal atrial fibrillation) (HCC)   Thrombocytopenia (HCC)   Parkinson's disease   Coronary artery disease involving native coronary artery of native heart with angina pectoris (HCC)   Essential hypertension   Hypothyroidism   COPD (chronic obstructive pulmonary disease) (HCC)   Slurred speech   Delirium due to multiple etiologies, acute, hyperactive   Dementia associated with Parkinson's disease ([place])   Frequent falls   Chronic anticoagulation   Hypertensive urgency   Chronic diastolic CHF (congestive heart failure) (HCC)   SDH (subdural hematoma) (HCC)   Subdural hematoma in the setting of chronic anticoagulation and recurrent falls. Eliquis was reversed with Kcentra.  Discontinue Eliquis Neurosurgery evaluated the patient.  Not a candidate for surgical intervention Advised Blood pressure control to maintain systolic under 353. Repeat CT head shows stable subdural  hematoma. Continue neurologic checks Continue Fall precautions Not a surgical candidate for resection of subdural hematoma given his underlying dementia.   Recommended conservative management with medical management of his agitation.   Continue PT and OT eval.  No further scans. Repeat CT head in 3 to 4 weeks. Patient is alert, awake, and following commands.   Chronic Diastolic CHF: Appears euvolemic. Continue furosemide, losartan and metoprolol.   Hypertensive urgency: Patient presented with systolic BP above 299 on arrival. Started on Cardene drip for maintaining systolic BP below 242. Continue metoprolol 25 mg every 12 hours, amlodipine 2.5 mg daily BP improved.   Delirium due to multiple etiologies. Parkinson's dementia: Continue Haldol as needed. Delirium precautions  Slurred Speech: Appears subacute to chronic per discussion with wife. No acute findings on CT head to suggest CVA. Frequent neurochecks.   COPD: No acute exacerbation. Continue Stiolto Respimat with as needed albuterol.   CAD: Troponin 11 and EKG nonacute. Continue metoprolol.  Continue rosuvastatin,  Eliquis discontinued.   Consider aspirin to replace Eliquis at discharge   Parkinson's disease: Continue Sinemet and Comtan.  Obstructive sleep apnea on CPAP: Continue CPAP nightly   DVT prophylaxis: SCDs Code Status: DNR Family Communication: No family at bed side. Disposition Plan:   Status is: Inpatient Remains inpatient appropriate because: Admitted for altered mentation secondary to subdural hematoma.  Repeat CT head shows stable hematoma.  Neurosurgery no surgical intervention.   PT / OT pending   Consultants:  Neurosurgery  Procedures: Repeat CT head Antimicrobials: None.  Subjective: Patient was seen and examined at bedside.  Overnight events noted. Patient appears to be at his baseline.  He  is following commands, no focal deficits noted.  Objective: Vitals:   04/06/22 2044  04/06/22 2322 04/07/22 0403 04/07/22 0832  BP: 103/81 (!) 150/57 139/72 118/84  Pulse: 61 (!) 57 60 62  Resp: '20 17 18   '$ Temp: 98.3 F (36.8 C) 97.8 F (36.6 C) 98.1 F (36.7 C) 97.8 F (36.6 C)  TempSrc: Oral Oral Oral Oral  SpO2: 96% 95% 96% 95%  Weight:      Height:        Intake/Output Summary (Last 24 hours) at 04/07/2022 1105 Last data filed at 04/07/2022 1036 Gross per 24 hour  Intake 220 ml  Output 175 ml  Net 45 ml   Filed Weights   03/22/2022 1932  Weight: 94 kg    Examination:  General exam: Comfortable, deconditioned, not in any acute distress. Respiratory system: Clear to auscultation. Respiratory effort normal. RR 14 Cardiovascular system: S1 & S2 heard, regular rate and rhythm, no murmur. Gastrointestinal system: Abdomen is soft, non distended, non tender, BS+ Central nervous system: Alert and oriented x 1.  Confused with slurred speech. Extremities: Symmetric 5 x 5 power. Skin: No rashes, lesions or ulcers Psychiatry:Not assessed.    Data Reviewed: I have personally reviewed following labs and imaging studies  CBC: Recent Labs  Lab 03/20/2022 1936 04/05/22 0439 04/07/22 0340  WBC 15.5* 14.4* 14.6*  HGB 15.8 14.7 16.1  HCT 48.9 45.0 49.4  MCV 92.3 91.3 91.7  PLT 141* 126* 751   Basic Metabolic Panel: Recent Labs  Lab 04/06/2022 1936  NA 138  K 4.2  CL 101  CO2 27  GLUCOSE 115*  BUN 19  CREATININE 0.84  CALCIUM 9.3   GFR: Estimated Creatinine Clearance: 71.6 mL/min (by C-G formula based on SCr of 0.84 mg/dL). Liver Function Tests: No results for input(s): "AST", "ALT", "ALKPHOS", "BILITOT", "PROT", "ALBUMIN" in the last 168 hours. No results for input(s): "LIPASE", "AMYLASE" in the last 168 hours. No results for input(s): "AMMONIA" in the last 168 hours. Coagulation Profile: Recent Labs  Lab 04/15/2022 1936  INR 1.3*   Cardiac Enzymes: No results for input(s): "CKTOTAL", "CKMB", "CKMBINDEX", "TROPONINI" in the last 168 hours. BNP  (last 3 results) No results for input(s): "PROBNP" in the last 8760 hours. HbA1C: No results for input(s): "HGBA1C" in the last 72 hours. CBG: Recent Labs  Lab 04/05/22 0451 04/06/22 0739 04/07/22 0457  GLUCAP 119* 127* 117*   Lipid Profile: No results for input(s): "CHOL", "HDL", "LDLCALC", "TRIG", "CHOLHDL", "LDLDIRECT" in the last 72 hours. Thyroid Function Tests: No results for input(s): "TSH", "T4TOTAL", "FREET4", "T3FREE", "THYROIDAB" in the last 72 hours. Anemia Panel: No results for input(s): "VITAMINB12", "FOLATE", "FERRITIN", "TIBC", "IRON", "RETICCTPCT" in the last 72 hours. Sepsis Labs: No results for input(s): "PROCALCITON", "LATICACIDVEN" in the last 168 hours.  Recent Results (from the past 240 hour(s))  Resp panel by RT-PCR (RSV, Flu A&B, Covid) Anterior Nasal Swab     Status: None   Collection Time: 04/05/22  2:07 PM   Specimen: Anterior Nasal Swab  Result Value Ref Range Status   SARS Coronavirus 2 by RT PCR NEGATIVE NEGATIVE Final    Comment: (NOTE) SARS-CoV-2 target nucleic acids are NOT DETECTED.  The SARS-CoV-2 RNA is generally detectable in upper respiratory specimens during the acute phase of infection. The lowest concentration of SARS-CoV-2 viral copies this assay can detect is 138 copies/mL. A negative result does not preclude SARS-Cov-2 infection and should not be used as the sole basis for treatment or other  patient management decisions. A negative result may occur with  improper specimen collection/handling, submission of specimen other than nasopharyngeal swab, presence of viral mutation(s) within the areas targeted by this assay, and inadequate number of viral copies(<138 copies/mL). A negative result must be combined with clinical observations, patient history, and epidemiological information. The expected result is Negative.  Fact Sheet for Patients:  EntrepreneurPulse.com.au  Fact Sheet for Healthcare Providers:   IncredibleEmployment.be  This test is no t yet approved or cleared by the [place] FDA and  has been authorized for detection and/or diagnosis of SARS-CoV-2 by FDA under an Emergency Use Authorization (EUA). This EUA will remain  in effect (meaning this test can be used) for the duration of the COVID-19 declaration under Section 564(b)(1) of the Act, 21 U.S.C.section 360bbb-3(b)(1), unless the authorization is terminated  or revoked sooner.       Influenza A by PCR NEGATIVE NEGATIVE Final   Influenza B by PCR NEGATIVE NEGATIVE Final    Comment: (NOTE) The Xpert Xpress SARS-CoV-2/FLU/RSV plus assay is intended as an aid in the diagnosis of influenza from Nasopharyngeal swab specimens and should not be used as a sole basis for treatment. Nasal washings and aspirates are unacceptable for Xpert Xpress SARS-CoV-2/FLU/RSV testing.  Fact Sheet for Patients: EntrepreneurPulse.com.au  Fact Sheet for Healthcare Providers: IncredibleEmployment.be  This test is not yet approved or cleared by the [place] FDA and has been authorized for detection and/or diagnosis of SARS-CoV-2 by FDA under an Emergency Use Authorization (EUA). This EUA will remain in effect (meaning this test can be used) for the duration of the COVID-19 declaration under Section 564(b)(1) of the Act, 21 U.S.C. section 360bbb-3(b)(1), unless the authorization is terminated or revoked.     Resp Syncytial Virus by PCR NEGATIVE NEGATIVE Final    Comment: (NOTE) Fact Sheet for Patients: EntrepreneurPulse.com.au  Fact Sheet for Healthcare Providers: IncredibleEmployment.be  This test is not yet approved or cleared by the [place] FDA and has been authorized for detection and/or diagnosis of SARS-CoV-2 by FDA under an Emergency Use Authorization (EUA). This EUA will remain in effect (meaning this test can be used) for  the duration of the COVID-19 declaration under Section 564(b)(1) of the Act, 21 U.S.C. section 360bbb-3(b)(1), unless the authorization is terminated or revoked.  Performed at [facility], [place], [place] [address]   Respiratory (~20 pathogens) panel by PCR     Status: None   Collection Time: 04/05/22  2:07 PM   Specimen: Nasopharyngeal Swab; Respiratory  Result Value Ref Range Status   Adenovirus NOT DETECTED NOT DETECTED Final   Coronavirus 229E NOT DETECTED NOT DETECTED Final    Comment: (NOTE) The Coronavirus on the Respiratory Panel, DOES NOT test for the novel  Coronavirus (2019 nCoV)    Coronavirus HKU1 NOT DETECTED NOT DETECTED Final   Coronavirus NL63 NOT DETECTED NOT DETECTED Final   Coronavirus OC43 NOT DETECTED NOT DETECTED Final   Metapneumovirus NOT DETECTED NOT DETECTED Final   Rhinovirus / Enterovirus NOT DETECTED NOT DETECTED Final   Influenza A NOT DETECTED NOT DETECTED Final   Influenza B NOT DETECTED NOT DETECTED Final   Parainfluenza Virus 1 NOT DETECTED NOT DETECTED Final   Parainfluenza Virus 2 NOT DETECTED NOT DETECTED Final   Parainfluenza Virus 3 NOT DETECTED NOT DETECTED Final   Parainfluenza Virus 4 NOT DETECTED NOT DETECTED Final   Respiratory Syncytial Virus NOT DETECTED NOT DETECTED Final   Bordetella pertussis NOT DETECTED NOT DETECTED  Final   Bordetella Parapertussis NOT DETECTED NOT DETECTED Final   Chlamydophila pneumoniae NOT DETECTED NOT DETECTED Final   Mycoplasma pneumoniae NOT DETECTED NOT DETECTED Final    Comment: Performed at [facility], [place] [address] [place], [place] [address]    Radiology Studies: No results found.  Scheduled Meds:  amLODipine  2.5 mg Oral Daily   arformoterol  15 mcg Nebulization BID   And   umeclidinium bromide  1 puff Inhalation Daily   buPROPion  150 mg Oral BID   carbidopa-levodopa  0.5 tablet Oral QID   entacapone  200 mg Oral TID   famotidine  20 mg Oral BID    feeding supplement (NEPRO CARB STEADY)  237 mL Oral TID BM   furosemide  40 mg Oral Daily   levothyroxine  50 mcg Oral Q0600   metoprolol tartrate  25 mg Oral BID   rosuvastatin  10 mg Oral Daily   sertraline  50 mg Oral Daily   sodium chloride flush  3 mL Intravenous Q12H   Continuous Infusions:   LOS: 3 days    Time spent: 35 mins    Torunn Chancellor, MD [facility]   If 7PM-7AM, please contact night-coverage

## 2022-04-07 NOTE — Care Management Important Message (Signed)
Important Message  Patient Details  Name: Andre [name] MRN: [id number] Date of Birth: August 01, 1935   Medicare Important Message Given:  N/A - LOS <3 / Initial given by admissions     [name] 04/07/2022, 9:10 AM

## 2022-04-08 DIAGNOSIS — S065XAA Traumatic subdural hemorrhage with loss of consciousness status unknown, initial encounter: Secondary | ICD-10-CM | POA: Diagnosis not present

## 2022-04-08 LAB — BASIC METABOLIC PANEL
Anion gap: 11 (ref 5–15)
BUN: 37 mg/dL — ABNORMAL HIGH (ref 8–23)
CO2: 24 mmol/L (ref 22–32)
Calcium: 8.8 mg/dL — ABNORMAL LOW (ref 8.9–10.3)
Chloride: 104 mmol/L (ref 98–111)
Creatinine, Ser: 0.83 mg/dL (ref 0.61–1.24)
GFR, Estimated: >60 mL/min (ref >60)
Glucose, Bld: 116 mg/dL — ABNORMAL HIGH (ref 70–99)
Potassium: 3.1 mmol/L — ABNORMAL LOW (ref 3.5–5.1)
Sodium: 139 mmol/L (ref 135–145)

## 2022-04-08 LAB — CBC
HCT: 45.5 % (ref 39.0–52.0)
Hemoglobin: 15 g/dL (ref 13.0–17.0)
MCH: 29.7 pg (ref 26.0–34.0)
MCHC: 33 g/dL (ref 30.0–36.0)
MCV: 90.1 fL (ref 80.0–100.0)
Platelets: 155 10*3/uL (ref 150–400)
RBC: 5.05 MIL/uL (ref 4.22–5.81)
RDW: 13.8 % (ref 11.5–15.5)
WBC: 12.5 10*3/uL — ABNORMAL HIGH (ref 4.0–10.5)
nRBC: 0 % (ref 0.0–0.2)

## 2022-04-08 LAB — GLUCOSE, CAPILLARY: Glucose-Capillary: 122 mg/dL — ABNORMAL HIGH (ref 70–99)

## 2022-04-08 LAB — PHOSPHORUS: Phosphorus: 3.5 mg/dL (ref 2.5–4.6)

## 2022-04-08 LAB — MAGNESIUM: Magnesium: 2.1 mg/dL (ref 1.7–2.4)

## 2022-04-08 MED ORDER — POTASSIUM CHLORIDE 20 MEQ PO PACK
40.0000 meq | PACK | Freq: Once | ORAL | Status: AC
  Administered 2022-04-08: 40 meq via ORAL
  Filled 2022-04-08: qty 2

## 2022-04-08 MED ORDER — LORAZEPAM 0.5 MG PO TABS
0.5000 mg | ORAL_TABLET | Freq: Four times a day (QID) | ORAL | Status: DC | PRN
  Administered 2022-04-08 – 2022-04-19 (×16): 0.5 mg via ORAL
  Filled 2022-04-08 (×16): qty 1

## 2022-04-08 NOTE — Progress Notes (Signed)
Physical Therapy Treatment Patient Details Name: Andre [name] MRN: [id number] DOB: 1935/04/30 Today's Date: 04/08/2022   History of Present Illness presented to ER from home secondary to worsening confusion, agitation; admitted for management of acute L frontal SDH (stable across imaging) with small (L to R) midline shift    PT Comments    Marked improvement in alertness, orientation and overall participation this date.  Follows 75% simple commands; optimized with task demonstration from therapist.  Completes transition towards edge of bed with mod assist; maintains unsupported sitting balance with close sup.  Progresses towards sit/stand, standing balance and lateral stepping along edge of bed, mod assist +2 with RW.  Generalized weakness throughout R hemi-body with R lateral lean in all sitting < standing postures; mod cuing for balance and safety throughout.    Recommendations for follow up therapy are one component of a multi-disciplinary discharge planning process, led by the attending physician.  Recommendations may be updated based on patient status, additional functional criteria and insurance authorization.  Follow Up Recommendations  Skilled nursing-short term rehab (<3 hours/day) Can patient physically be transported by private vehicle: No   Assistance Recommended at Discharge Frequent or constant Supervision/Assistance  Patient can return home with the following Two people to help with walking and/or transfers;Two people to help with bathing/dressing/bathroom   Equipment Recommendations       Recommendations for Other Services       Precautions / Restrictions Precautions Precautions: Fall Restrictions Weight Bearing Restrictions: No     Mobility  Bed Mobility Overal bed mobility: Needs Assistance Bed Mobility: Supine to Sit, Sit to Supine     Supine to sit: Mod assist Sit to supine: Mod assist   General bed mobility comments: assist to initiate movement  transition    Transfers Overall transfer level: Needs assistance Equipment used: Rolling walker (2 wheels) Transfers: Sit to/from Stand Sit to Stand: Min assist, Mod assist, +2 physical assistance           General transfer comment: hand-over-hand for UE placement on RW; assist for lift off and standing balance, reaches full postural extension this date    Ambulation/Gait Ambulation/Gait assistance: Mod assist, +2 physical assistance Gait Distance (Feet): 5 Feet Assistive device: Rolling walker (2 wheels)         General Gait Details: lateral stepping along edge of bed with RW, mod assist +2; R lateral lean that worsens with fatigue (absent awareness/attempts at correction); difficulty unweighting/advancing R LE (mod manual cuing for weight shift)   Stairs             Wheelchair Mobility    Modified Rankin (Stroke Patients Only)       Balance Overall balance assessment: Needs assistance Sitting-balance support: No upper extremity supported, Feet supported Sitting balance-Leahy Scale: Fair     Standing balance support: Bilateral upper extremity supported Standing balance-Leahy Scale: Poor                              Cognition Arousal/Alertness: Awake/alert Behavior During Therapy: WFL for tasks assessed/performed                                   General Comments: States and responds to name; follows 75% simple commands (optimized by task demonstration by therapist)        Exercises Other Exercises Other Exercises: Unsupported sitting edge of bed,  close sup with mild R lateral lean but corrects with cuing.  Dynamic reaching with alternate UEs for weight shift/core control, close sup; tolerates weight shift 2-3" from immediate BOS without LOB and with fair recovery.    General Comments        Pertinent Vitals/Pain Pain Assessment Pain Assessment: Faces Faces Pain Scale: No hurt    Home Living                           Prior Function            PT Goals (current goals can now be found in the care plan section) Acute Rehab PT Goals PT Goal Formulation: Patient unable to participate in goal setting Time For Goal Achievement: 04/19/22 Potential to Achieve Goals: Fair Progress towards PT goals: Progressing toward goals    Frequency    Min 2X/week      PT Plan Current plan remains appropriate    Co-evaluation              AM-PAC PT "6 Clicks" Mobility   Outcome Measure  Help needed turning from your back to your side while in a flat bed without using bedrails?: A Lot Help needed moving from lying on your back to sitting on the side of a flat bed without using bedrails?: A Lot Help needed moving to and from a bed to a chair (including a wheelchair)?: A Lot Help needed standing up from a chair using your arms (e.g., wheelchair or bedside chair)?: A Lot Help needed to walk in hospital room?: A Lot Help needed climbing 3-5 steps with a railing? : A Lot 6 Click Score: 12    End of Session Equipment Utilized During Treatment: Gait belt Activity Tolerance: Patient tolerated treatment well Patient left: in bed;with call bell/phone within reach;with bed alarm set Nurse Communication: Mobility status PT Visit Diagnosis: Muscle weakness (generalized) (M62.81);Difficulty in walking, not elsewhere classified (R26.2);Other symptoms and signs involving the nervous system (R29.898)     Time: 0981-1914 PT Time Calculation (min) (ACUTE ONLY): 16 min  Charges:  $Gait Training: 8-22 mins                     Trexton Escamilla H. Owens Shark, PT, DPT, NCS 04/08/22, 4:51 PM [phone]
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
## 2022-04-08 NOTE — TOC Initial Note (Addendum)
Transition of Care [facility]) - Initial/Assessment Note    Patient Details  Name: Andre [name] MRN: [id number] Date of Birth: 1936-01-22  Transition of Care [facility]) CM/SW Contact:    Laurena Slimmer, RN Phone Number: 04/08/2022, 4:24 PM  Clinical Narrative:                 Spoke with patient's wife at bedside to discuss therapy recommendation. She is agreeable to SNF. She did not have a preference of facilites but would like facilities near [facility].   FL2 completed.  Bed search started  Expected Discharge Plan: [place] Barriers to Discharge: Continued Medical Work up   Patient Goals and CMS Choice   CMS Medicare.gov Compare Post Acute Care list provided to:: Patient Represenative (must comment) Choice offered to / list presented to : Spouse      Expected Discharge Plan and Services   Discharge Planning Services: CM Consult Post Acute Care Choice: [place] Living arrangements for the past 2 months: Single Family Home                                      Prior Living Arrangements/Services Living arrangements for the past 2 months: Single Family Home Lives with:: Spouse Patient language and need for interpreter reviewed:: Yes Do you feel safe going back to the place where you live?: No   wife cannot care for patient at home  Need for Family Participation in Patient Care: Yes (Comment) Care giver support system in place?: Yes (comment)   Criminal Activity/Legal Involvement Pertinent to Current Situation/Hospitalization: No - Comment as needed  Activities of Daily Living Home Assistive Devices/Equipment: Eyeglasses, Dentures (specify type) ADL Screening (condition at time of admission) Patient's cognitive ability adequate to safely complete daily activities?: No Is the patient deaf or have difficulty hearing?: No Does the patient have difficulty seeing, even when wearing glasses/contacts?: No Does the patient have difficulty  concentrating, remembering, or making decisions?: Yes Patient able to express need for assistance with ADLs?: No Does the patient have difficulty dressing or bathing?: Yes Independently performs ADLs?: No Communication: Independent Dressing (OT): Needs assistance Is this a change from baseline?: Pre-admission baseline Grooming: Needs assistance Is this a change from baseline?: Pre-admission baseline Feeding: Needs assistance Is this a change from baseline?: Pre-admission baseline Bathing: Needs assistance Is this a change from baseline?: Pre-admission baseline Toileting: Needs assistance Is this a change from baseline?: Pre-admission baseline In/Out Bed: Needs assistance Is this a change from baseline?: Pre-admission baseline Walks in Home: Needs assistance Is this a change from baseline?: Pre-admission baseline Does the patient have difficulty walking or climbing stairs?: Yes Weakness of Legs: Both Weakness of Arms/Hands: None  Permission Sought/Granted      Share Information with NAME: Zackarey Holleman     Permission granted to share info w Relationship: spouse  Permission granted to share info w Contact Information: [phone]  Emotional Assessment Appearance:: Appears stated age Attitude/Demeanor/Rapport: Unable to Assess Affect (typically observed): Agitated   Alcohol / Substance Use: Not Applicable Psych Involvement: No (comment)  Admission diagnosis:  Subdural hematoma ([place]) [S06.5XAA] SDH (subdural hematoma) ([place]) [S06.5XAA] Patient Active Problem List   Diagnosis Date Noted   Frequent falls 04/05/2022   Chronic anticoagulation 04/05/2022   Hypertensive urgency 04/05/2022   Chronic diastolic CHF (congestive heart failure) ([place]) 04/05/2022   SDH (subdural hematoma) ([place]) 04/05/2022   Subdural hematoma ([place])  03/23/2022   Dizziness 10/12/2021   Bruising 10/12/2021   Dementia associated with Parkinson's disease ([place]) 07/07/2021   BPH (benign prostatic  hyperplasia) 05/11/2021   Delirium due to multiple etiologies, acute, hyperactive 05/11/2021   Acute decompensated heart failure ([place]) 04/12/2021   Acute exacerbation of CHF (congestive heart failure) ([place]) 04/11/2021   Declining functional status 04/30/2020   Slurred speech 04/20/2020   Hypersomnia 04/20/2020   Gait abnormality 07/31/2019   Pulmonary fibrosis ([place]) 07/05/2019   COPD (chronic obstructive pulmonary disease) ([place]) 07/09/2018   Neck pain 03/09/2018   Myalgia 03/09/2018   Decreased hearing of both ears 03/09/2018   Cervical facet syndrome 01/22/2018   Renal cyst 10/02/2017   Pleural effusion 09/18/2017   Diarrhea 08/21/2017   Anemia 08/21/2017   Renal lesion 07/08/2017   Fall 05/29/2017   Abrasion 02/08/2017   Anxiety and depression 01/05/2017   Prediabetes 10/27/2016   Hypothyroidism 10/27/2016   (HFpEF) heart failure with preserved ejection fraction ([place]) 09/27/2016   Hypothyroidism due to medication 09/07/2016   Parkinson's disease 06/30/2016   Thrombocytopenia ([place]) 01/29/2016   PAF (paroxysmal atrial fibrillation) (HCC)    BMI 40.0-44.9, adult ([place]) 05/06/2015   Erythrocytosis 01/28/2015   Atherosclerosis of abdominal aorta ([place]) 12/08/2014   Barrett's esophagus 12/31/2013   DDD (degenerative disc disease), cervical 10/01/2013   DDD (degenerative disc disease), lumbar 10/01/2013   Coronary artery disease involving native coronary artery of native heart with angina pectoris ([place]) 10/30/2012   GERD (gastroesophageal reflux disease) 10/30/2012   Hyperlipidemia with target LDL less than 70 10/30/2012   Essential hypertension 10/30/2012   Dyspnea 09/10/2012   Allergic rhinitis 09/10/2012   OSA on CPAP 09/10/2012   PCP:  [name], MD Pharmacy:   [place], [place] - [address] [place] [address] Phone: [phone] Fax: [phone]  [place] [employer] [address] [place] [address] Phone: [phone] Fax: [phone]  [id number] [place] [id number]: [phone]: [phone]    Social Determinants of Health (SDOH) Social History: SDOH Screenings   Food Insecurity: No Food Insecurity (04/06/2022)  Housing: Low Risk  (04/06/2022)  Transportation Needs: No Transportation Needs (04/06/2022)  Utilities: Not At Risk (04/06/2022)  Depression (PHQ2-9): Low Risk  (01/18/2022)  Financial Resource Strain: Low Risk  (09/14/2021)  Physical Activity: Unknown (12/29/2017)  Social Connections: Unknown (09/14/2021)  Stress: No Stress Concern Present (09/14/2021)  Tobacco Use: Medium Risk (04/06/2022)   SDOH Interventions:     Readmission Risk Interventions     No data to display

## 2022-04-08 NOTE — Progress Notes (Signed)
PROGRESS NOTE    Andre [name]  URK:[id number] DOB: 08-18-1935 DOA: 04/14/2022 PCP: [name], MD    Brief Narrative:  This 86 yrs old community dwelling white male with PMH significant for CAD s/p CABG, paroxysmal A-fib on Eliquis,, parkinsonism with Parkinson's dementia, OSA on CPAP, HFpEF and frequent falls who was brought to the ED with concerns for increased confusion and agitation that has been ongoing but worse over the past week.  Wife reports patient has been falling and last fell 2 days ago,  hitting his head.  CT head showed an acute subdural hemorrhage with minimal left-to-right midline shift.  Patient was evaluated by neurosurgery Dr. Cari Caraway who is stated patient is not a surgical candidate given his dementia and frailty.  Patient was given Kcentra for Eliquis reversal.  Patient is started on Cardene drip for hypertension.  Repeat CT head shows stable subdural hematoma..  Assessment & Plan:   Principal Problem:   Subdural hematoma (HCC) Active Problems:   OSA on CPAP   PAF (paroxysmal atrial fibrillation) (HCC)   Thrombocytopenia (HCC)   Parkinson's disease   Coronary artery disease involving native coronary artery of native heart with angina pectoris (HCC)   Essential hypertension   Hypothyroidism   COPD (chronic obstructive pulmonary disease) (HCC)   Slurred speech   Delirium due to multiple etiologies, acute, hyperactive   Dementia associated with Parkinson's disease ([place])   Frequent falls   Chronic anticoagulation   Hypertensive urgency   Chronic diastolic CHF (congestive heart failure) (HCC)   SDH (subdural hematoma) (HCC)   Subdural hematoma in the setting of chronic anticoagulation and recurrent falls. Eliquis was reversed with Kcentra.  Discontinue Eliquis Neurosurgery evaluated the patient.  Not a candidate for surgical intervention. Advised Blood pressure control to maintain systolic under 831. Repeat CT head shows stable subdural  hematoma. Continue neurologic checks Continue Fall precautions Not a surgical candidate for resection of subdural hematoma given his underlying dementia.   Recommended conservative management with medical management of his agitation.   Continue PT and OT eval.  No further scans. Repeat CT head in 3 to 4 weeks. Patient is alert, awake, and following commands, back to his baseline.   Chronic Diastolic CHF: Appears euvolemic. Continue furosemide, losartan and metoprolol.   Hypertensive urgency: Patient presented with systolic BP above 517 on arrival. Started on Cardene drip for maintaining systolic BP below 616. Continue metoprolol 25 mg every 12 hours, amlodipine 2.5 mg daily BP improved.   Delirium due to multiple etiologies: Parkinson's dementia: Continue Haldol as needed. Delirium precautions  Slurred Speech: Appears subacute to chronic per discussion with wife. No acute findings on CT head to suggest CVA. Frequent neurochecks.   COPD: No acute exacerbation. Continue Stiolto Respimat with as needed albuterol.   CAD: Troponin 11 and EKG nonacute. Continue metoprolol.  Continue rosuvastatin,  Eliquis discontinued.   Consider aspirin to replace Eliquis at discharge   Parkinson's disease: Continue Sinemet and Comtan.  Obstructive sleep apnea on CPAP: Continue CPAP nightly   DVT prophylaxis: SCDs Code Status: DNR Family Communication: No family at bed side. Disposition Plan:   Status is: Inpatient Remains inpatient appropriate because: Admitted for altered mentation secondary to subdural hematoma.  Repeat CT head shows stable hematoma.  Neurosurgery no surgical intervention.  PT and OT recommended skilled Nursing facility awaiting insurance authorization.   Consultants:  Neurosurgery  Procedures: Repeat CT head Antimicrobials: None.  Subjective: Patient was seen and examined at bedside.  Overnight events noted.  Patient appears to be at his baseline mental  status. He is following commands,  No focal deficit noted. Patient is awaiting insurance authorization for SNF placement.  Objective: Vitals:   04/08/22 0004 04/08/22 0751 04/08/22 0753 04/08/22 1141  BP: (!) 125/56  133/65 (!) 116/58  Pulse: (!) 58  (!) 58 (!) 59  Resp: 18  18   Temp: 98.7 F (37.1 C)  98.8 F (37.1 C) 97.8 F (36.6 C)  TempSrc:      SpO2: 98% 96% 99% 96%  Weight:      Height:        Intake/Output Summary (Last 24 hours) at 04/08/2022 1221 Last data filed at 04/08/2022 0442 Gross per 24 hour  Intake --  Output 700 ml  Net -700 ml   Filed Weights   03/29/2022 1932  Weight: 94 kg    Examination:  General exam: Appears comfortable, deconditioned, not in acute distress. Respiratory system: CTA bilaterally. Respiratory effort normal. RR 13 Cardiovascular system: S1 & S2 heard, regular rate and rhythm, no murmur. Gastrointestinal system: Abdomen is soft, non distended, non tender, BS+ Central nervous system: Alert and oriented x 3.  Confused with slurred speech. Extremities: No edema, no cyanosis, no clubbing. Skin: No rashes, lesions or ulcers Psychiatry: Mood and affect  > Appropriate.    Data Reviewed: I have personally reviewed following labs and imaging studies  CBC: Recent Labs  Lab 04/15/2022 1936 04/05/22 0439 04/07/22 0340 04/08/22 0605  WBC 15.5* 14.4* 14.6* 12.5*  HGB 15.8 14.7 16.1 15.0  HCT 48.9 45.0 49.4 45.5  MCV 92.3 91.3 91.7 90.1  PLT 141* 126* 155 884   Basic Metabolic Panel: Recent Labs  Lab 04/16/2022 1936 04/08/22 0605  NA 138 139  K 4.2 3.1*  CL 101 104  CO2 27 24  GLUCOSE 115* 116*  BUN 19 37*  CREATININE 0.84 0.83  CALCIUM 9.3 8.8*  MG  --  2.1  PHOS  --  3.5   GFR: Estimated Creatinine Clearance: 72.4 mL/min (by C-G formula based on SCr of 0.83 mg/dL). Liver Function Tests: No results for input(s): "AST", "ALT", "ALKPHOS", "BILITOT", "PROT", "ALBUMIN" in the last 168 hours. No results for input(s):  "LIPASE", "AMYLASE" in the last 168 hours. No results for input(s): "AMMONIA" in the last 168 hours. Coagulation Profile: Recent Labs  Lab 04/03/2022 1936  INR 1.3*   Cardiac Enzymes: No results for input(s): "CKTOTAL", "CKMB", "CKMBINDEX", "TROPONINI" in the last 168 hours. BNP (last 3 results) No results for input(s): "PROBNP" in the last 8760 hours. HbA1C: No results for input(s): "HGBA1C" in the last 72 hours. CBG: Recent Labs  Lab 04/05/22 0451 04/06/22 0739 04/07/22 0457 04/07/22 1957 04/08/22 0754  GLUCAP 119* 127* 117* 137* 122*   Lipid Profile: No results for input(s): "CHOL", "HDL", "LDLCALC", "TRIG", "CHOLHDL", "LDLDIRECT" in the last 72 hours. Thyroid Function Tests: No results for input(s): "TSH", "T4TOTAL", "FREET4", "T3FREE", "THYROIDAB" in the last 72 hours. Anemia Panel: No results for input(s): "VITAMINB12", "FOLATE", "FERRITIN", "TIBC", "IRON", "RETICCTPCT" in the last 72 hours. Sepsis Labs: No results for input(s): "PROCALCITON", "LATICACIDVEN" in the last 168 hours.  Recent Results (from the past 240 hour(s))  Resp panel by RT-PCR (RSV, Flu A&B, Covid) Anterior Nasal Swab     Status: None   Collection Time: 04/05/22  2:07 PM   Specimen: Anterior Nasal Swab  Result Value Ref Range Status   SARS Coronavirus 2 by RT PCR NEGATIVE NEGATIVE Final    Comment: (NOTE) SARS-CoV-2  target nucleic acids are NOT DETECTED.  The SARS-CoV-2 RNA is generally detectable in upper respiratory specimens during the acute phase of infection. The lowest concentration of SARS-CoV-2 viral copies this assay can detect is 138 copies/mL. A negative result does not preclude SARS-Cov-2 infection and should not be used as the sole basis for treatment or other patient management decisions. A negative result may occur with  improper specimen collection/handling, submission of specimen other than nasopharyngeal swab, presence of viral mutation(s) within the areas targeted by this  assay, and inadequate number of viral copies(<138 copies/mL). A negative result must be combined with clinical observations, patient history, and epidemiological information. The expected result is Negative.  Fact Sheet for Patients:  EntrepreneurPulse.com.au  Fact Sheet for Healthcare Providers:  IncredibleEmployment.be  This test is no t yet approved or cleared by the [place] FDA and  has been authorized for detection and/or diagnosis of SARS-CoV-2 by FDA under an Emergency Use Authorization (EUA). This EUA will remain  in effect (meaning this test can be used) for the duration of the COVID-19 declaration under Section 564(b)(1) of the Act, 21 U.S.C.section 360bbb-3(b)(1), unless the authorization is terminated  or revoked sooner.       Influenza A by PCR NEGATIVE NEGATIVE Final   Influenza B by PCR NEGATIVE NEGATIVE Final    Comment: (NOTE) The Xpert Xpress SARS-CoV-2/FLU/RSV plus assay is intended as an aid in the diagnosis of influenza from Nasopharyngeal swab specimens and should not be used as a sole basis for treatment. Nasal washings and aspirates are unacceptable for Xpert Xpress SARS-CoV-2/FLU/RSV testing.  Fact Sheet for Patients: EntrepreneurPulse.com.au  Fact Sheet for Healthcare Providers: IncredibleEmployment.be  This test is not yet approved or cleared by the [place] FDA and has been authorized for detection and/or diagnosis of SARS-CoV-2 by FDA under an Emergency Use Authorization (EUA). This EUA will remain in effect (meaning this test can be used) for the duration of the COVID-19 declaration under Section 564(b)(1) of the Act, 21 U.S.C. section 360bbb-3(b)(1), unless the authorization is terminated or revoked.     Resp Syncytial Virus by PCR NEGATIVE NEGATIVE Final    Comment: (NOTE) Fact Sheet for Patients: EntrepreneurPulse.com.au  Fact Sheet for  Healthcare Providers: IncredibleEmployment.be  This test is not yet approved or cleared by the [place] FDA and has been authorized for detection and/or diagnosis of SARS-CoV-2 by FDA under an Emergency Use Authorization (EUA). This EUA will remain in effect (meaning this test can be used) for the duration of the COVID-19 declaration under Section 564(b)(1) of the Act, 21 U.S.C. section 360bbb-3(b)(1), unless the authorization is terminated or revoked.  Performed at [facility], [place], [place] [address]   Respiratory (~20 pathogens) panel by PCR     Status: None   Collection Time: 04/05/22  2:07 PM   Specimen: Nasopharyngeal Swab; Respiratory  Result Value Ref Range Status   Adenovirus NOT DETECTED NOT DETECTED Final   Coronavirus 229E NOT DETECTED NOT DETECTED Final    Comment: (NOTE) The Coronavirus on the Respiratory Panel, DOES NOT test for the novel  Coronavirus (2019 nCoV)    Coronavirus HKU1 NOT DETECTED NOT DETECTED Final   Coronavirus NL63 NOT DETECTED NOT DETECTED Final   Coronavirus OC43 NOT DETECTED NOT DETECTED Final   Metapneumovirus NOT DETECTED NOT DETECTED Final   Rhinovirus / Enterovirus NOT DETECTED NOT DETECTED Final   Influenza A NOT DETECTED NOT DETECTED Final   Influenza B NOT DETECTED NOT DETECTED Final  Parainfluenza Virus 1 NOT DETECTED NOT DETECTED Final   Parainfluenza Virus 2 NOT DETECTED NOT DETECTED Final   Parainfluenza Virus 3 NOT DETECTED NOT DETECTED Final   Parainfluenza Virus 4 NOT DETECTED NOT DETECTED Final   Respiratory Syncytial Virus NOT DETECTED NOT DETECTED Final   Bordetella pertussis NOT DETECTED NOT DETECTED Final   Bordetella Parapertussis NOT DETECTED NOT DETECTED Final   Chlamydophila pneumoniae NOT DETECTED NOT DETECTED Final   Mycoplasma pneumoniae NOT DETECTED NOT DETECTED Final    Comment: Performed at [facility], [place] [address], [place], [place] [address]     Radiology Studies: No results found.  Scheduled Meds:  amLODipine  2.5 mg Oral Daily   arformoterol  15 mcg Nebulization BID   And   umeclidinium bromide  1 puff Inhalation Daily   buPROPion  150 mg Oral BID   carbidopa-levodopa  0.5 tablet Oral QID   entacapone  200 mg Oral TID   famotidine  20 mg Oral BID   feeding supplement (NEPRO CARB STEADY)  237 mL Oral TID BM   furosemide  40 mg Oral Daily   levothyroxine  50 mcg Oral Q0600   metoprolol tartrate  25 mg Oral BID   rosuvastatin  10 mg Oral Daily   sertraline  50 mg Oral Daily   sodium chloride flush  3 mL Intravenous Q12H   Continuous Infusions:   LOS: 4 days    Time spent: 35 mins    Nimsi Males, MD [facility]   If 7PM-7AM, please contact night-coverage Creatinine is stable.

## 2022-04-08 NOTE — NC FL2 (Signed)
[place] LEVEL OF CARE FORM     IDENTIFICATION  Patient Name: Andre [name] Birthdate: 1935-10-31 Sex: male Admission Date (Current Location): 03/31/2022  [name] and [place] Number:      Facility and Address:  [facility], [address] [place], [place] [address]      Provider Number: [id number]  Attending Physician Name and Address:  Shawna Clamp, MD  Relative Name and Phone Number:  [name] ESPQZ,[phone]    Current Level of Care: Hospital Recommended Level of Care: [place] Prior Approval Number:    Date Approved/Denied:   PASRR Number: [id number]  Discharge Plan: SNF    Current Diagnoses: Patient Active Problem List   Diagnosis Date Noted   Frequent falls 04/05/2022   Chronic anticoagulation 04/05/2022   Hypertensive urgency 04/05/2022   Chronic diastolic CHF (congestive heart failure) ([place]) 04/05/2022   SDH (subdural hematoma) ([place]) 04/05/2022   Subdural hematoma ([place]) 04/05/2022   Dizziness 10/12/2021   Bruising 10/12/2021   Dementia associated with Parkinson's disease ([place]) 07/07/2021   BPH (benign prostatic hyperplasia) 05/11/2021   Delirium due to multiple etiologies, acute, hyperactive 05/11/2021   Acute decompensated heart failure ([place]) 04/12/2021   Acute exacerbation of CHF (congestive heart failure) ([place]) 04/11/2021   Declining functional status 04/30/2020   Slurred speech 04/20/2020   Hypersomnia 04/20/2020   Gait abnormality 07/31/2019   Pulmonary fibrosis ([place]) 07/05/2019   COPD (chronic obstructive pulmonary disease) ([place]) 07/09/2018   Neck pain 03/09/2018   Myalgia 03/09/2018   Decreased hearing of both ears 03/09/2018   Cervical facet syndrome 01/22/2018   Renal cyst 10/02/2017   Pleural effusion 09/18/2017   Diarrhea 08/21/2017   Anemia 08/21/2017   Renal lesion 07/08/2017   Fall 05/29/2017   Abrasion 02/08/2017   Anxiety and depression 01/05/2017   Prediabetes  10/27/2016   Hypothyroidism 10/27/2016   (HFpEF) heart failure with preserved ejection fraction ([place]) 09/27/2016   Hypothyroidism due to medication 09/07/2016   Parkinson's disease 06/30/2016   Thrombocytopenia ([place]) 01/29/2016   PAF (paroxysmal atrial fibrillation) (HCC)    BMI 40.0-44.9, adult ([place]) 05/06/2015   Erythrocytosis 01/28/2015   Atherosclerosis of abdominal aorta ([place]) 12/08/2014   Barrett's esophagus 12/31/2013   DDD (degenerative disc disease), cervical 10/01/2013   DDD (degenerative disc disease), lumbar 10/01/2013   Coronary artery disease involving native coronary artery of native heart with angina pectoris ([place]) 10/30/2012   GERD (gastroesophageal reflux disease) 10/30/2012   Hyperlipidemia with target LDL less than 70 10/30/2012   Essential hypertension 10/30/2012   Dyspnea 09/10/2012   Allergic rhinitis 09/10/2012   OSA on CPAP 09/10/2012    Orientation RESPIRATION BLADDER Height & Weight     Self, Time  Normal External catheter Weight: 94 kg Height:  '5\' 7"'$  (170.2 cm)  BEHAVIORAL SYMPTOMS/MOOD NEUROLOGICAL BOWEL NUTRITION STATUS  Other (Comment) (n/a)  (n/a) Continent Diet (DYS 1)  AMBULATORY STATUS COMMUNICATION OF NEEDS Skin   Limited Assist Verbally Normal                       Personal Care Assistance Level of Assistance  Bathing, Dressing, Feeding Bathing Assistance: Limited assistance Feeding assistance: Limited assistance Dressing Assistance: Limited assistance     Functional Limitations Info  Sight, Hearing Sight Info: Impaired Hearing Info: Impaired      SPECIAL CARE FACTORS FREQUENCY  PT (By licensed PT), OT (By licensed OT)     PT Frequency: Min 2x weekly OT Frequency: Min 2x  weekly            Contractures Contractures Info: Not present    Additional Factors Info  Code Status, Allergies Code Status Info: DNR Allergies Info: Pravastatin, Prednisone           Current Medications (04/08/2022):  This is the current  hospital active medication list Current Facility-Administered Medications  Medication Dose Route Frequency Provider Last Rate Last Admin   acetaminophen (TYLENOL) tablet 650 mg  650 mg Oral Q6H PRN Athena Masse, MD   650 mg at 04/07/22 2150   Or   acetaminophen (TYLENOL) suppository 650 mg  650 mg Rectal Q6H PRN Athena Masse, MD       albuterol (PROVENTIL) (2.5 MG/3ML) 0.083% nebulizer solution 2.5 mg  2.5 mg Inhalation Q6H PRN Athena Masse, MD       amLODipine (NORVASC) tablet 2.5 mg  2.5 mg Oral Daily Sharen Hones, MD   2.5 mg at 04/08/22 0837   arformoterol (BROVANA) nebulizer solution 15 mcg  15 mcg Nebulization BID Athena Masse, MD   15 mcg at 04/08/22 0750   And   umeclidinium bromide (INCRUSE ELLIPTA) 62.5 MCG/ACT 1 puff  1 puff Inhalation Daily Athena Masse, MD   1 puff at 04/08/22 0838   buPROPion [facility]) tablet 150 mg  150 mg Oral BID Sharen Hones, MD   150 mg at 04/08/22 9937   carbidopa-levodopa (SINEMET IR) 25-250 MG per tablet immediate release 0.5 tablet  0.5 tablet Oral QID Judd Gaudier V, MD   0.5 tablet at 04/08/22 1325   entacapone (COMTAN) tablet 200 mg  200 mg Oral TID Athena Masse, MD   200 mg at 04/08/22 0837   famotidine (PEPCID) tablet 20 mg  20 mg Oral BID Sharen Hones, MD   20 mg at 04/08/22 0837   feeding supplement (NEPRO CARB STEADY) liquid 237 mL  237 mL Oral TID BM Shawna Clamp, MD       furosemide (LASIX) tablet 40 mg  40 mg Oral Daily Judd Gaudier V, MD   40 mg at 04/08/22 0837   levothyroxine (SYNTHROID) tablet 50 mcg  50 mcg Oral Q0600 Athena Masse, MD   50 mcg at 04/08/22 0646   metoprolol tartrate (LOPRESSOR) tablet 25 mg  25 mg Oral BID Athena Masse, MD   25 mg at 04/08/22 0837   ondansetron (ZOFRAN) tablet 4 mg  4 mg Oral Q6H PRN Athena Masse, MD       Or   ondansetron [facility]) injection 4 mg  4 mg Intravenous Q6H PRN Athena Masse, MD       rosuvastatin (CRESTOR) tablet 10 mg  10 mg Oral Daily Judd Gaudier V, MD    10 mg at 04/08/22 0837   sertraline (ZOLOFT) tablet 50 mg  50 mg Oral Daily Judd Gaudier V, MD   50 mg at 04/08/22 0837   sodium chloride flush (NS) 0.9 % injection 3 mL  3 mL Intravenous Q12H Athena Masse, MD   3 mL at 04/08/22 1696     Discharge Medications: Please see discharge summary for a list of discharge medications.  Relevant Imaging Results:  Relevant Lab Results:   Additional Information VEL:[id number]  Laurena Slimmer, RN

## 2022-04-09 DIAGNOSIS — S065XAA Traumatic subdural hemorrhage with loss of consciousness status unknown, initial encounter: Secondary | ICD-10-CM | POA: Diagnosis not present

## 2022-04-09 LAB — POTASSIUM: Potassium: 4.2 mmol/L (ref 3.5–5.1)

## 2022-04-09 MED ORDER — AMLODIPINE BESYLATE 5 MG PO TABS
5.0000 mg | ORAL_TABLET | Freq: Every day | ORAL | Status: DC
  Administered 2022-04-10 – 2022-04-20 (×10): 5 mg via ORAL
  Filled 2022-04-09 (×11): qty 1

## 2022-04-09 NOTE — Progress Notes (Signed)
PROGRESS NOTE    Andre [name]  NWG:[id number] DOB: 08-Nov-1935 DOA: 03/24/2022 PCP: [name], MD    Brief Narrative:  This 86 yrs old community dwelling white male with PMH significant for CAD s/p CABG, paroxysmal A-fib on Eliquis,, parkinsonism with Parkinson's dementia, OSA on CPAP, HFpEF and frequent falls who was brought to the ED with concerns for increased confusion and agitation that has been ongoing but worse over the past week.  Wife reports patient has been falling and last fell 2 days ago,  hitting his head.  CT head showed an acute subdural hemorrhage with minimal left-to-right midline shift.  Patient was evaluated by neurosurgery Dr. Cari Caraway who is stated patient is not a surgical candidate given his dementia and frailty.  Patient was given Kcentra for Eliquis reversal.  Patient is started on Cardene drip for hypertension.  Repeat CT head shows stable subdural hematoma..  Assessment & Plan:   Principal Problem:   Subdural hematoma (HCC) Active Problems:   OSA on CPAP   PAF (paroxysmal atrial fibrillation) (HCC)   Thrombocytopenia (HCC)   Parkinson's disease   Coronary artery disease involving native coronary artery of native heart with angina pectoris (HCC)   Essential hypertension   Hypothyroidism   COPD (chronic obstructive pulmonary disease) (HCC)   Slurred speech   Delirium due to multiple etiologies, acute, hyperactive   Dementia associated with Parkinson's disease ([place])   Frequent falls   Chronic anticoagulation   Hypertensive urgency   Chronic diastolic CHF (congestive heart failure) (HCC)   SDH (subdural hematoma) (HCC)   Subdural hematoma in the setting of chronic anticoagulation and recurrent falls. Eliquis was reversed with Kcentra.  Discontinue Eliquis Neurosurgery evaluated the patient.  Not a candidate for surgical intervention. Advised Blood pressure control to maintain systolic under 578. Repeat CT head shows stable subdural  hematoma. Continue neurologic checks. Continue Fall precautions. Not a surgical candidate for resection of subdural hematoma given his underlying dementia.   Recommended conservative management with medical management of his agitation.   Continue PT and OT eval.  No further scans. Repeat CT head in 3 to 4 weeks. Patient is alert, awake, and following commands, back to his baseline.   Chronic Diastolic CHF: Appears euvolemic. Continue furosemide, losartan and metoprolol.   Hypertensive urgency: Patient presented with systolic BP above 469 on arrival. Started on Cardene drip for maintaining systolic BP below 629. Continue metoprolol 25 mg every 12 hours, amlodipine 2.5 mg daily BP improved.   Delirium due to multiple etiologies: Parkinson's dementia: Continue Haldol as needed. Delirium precautions  Slurred Speech: Appears subacute to chronic per discussion with wife. No acute findings on CT head to suggest CVA. Frequent neurochecks.   COPD: No acute exacerbation. Continue Stiolto Respimat with as needed albuterol.   CAD: Troponin 11 and EKG nonacute. Continue metoprolol.  Continue rosuvastatin,  Eliquis discontinued.   Consider aspirin to replace Eliquis at discharge   Parkinson's disease: Continue Sinemet and Comtan.  Obstructive sleep apnea on CPAP: Continue CPAP nightly   DVT prophylaxis: SCDs Code Status: DNR Family Communication: No family at bed side. Disposition Plan:   Status is: Inpatient Remains inpatient appropriate because: Admitted for altered mentation secondary to subdural hematoma.  Repeat CT head shows stable hematoma.  Neurosurgery no surgical intervention.  PT and OT recommended skilled Nursing facility , awaiting insurance authorization.   Consultants:  Neurosurgery  Procedures: Repeat CT head Antimicrobials: None.  Subjective: Patient was seen and examined at bedside.  Overnight events  noted. Patient appears to be at his baseline mental  status. He is following commands, no focal deficits noted. Patient is awaiting insurance authorization for SNF placement.  Objective: Vitals:   04/09/22 0049 04/09/22 0537 04/09/22 0750 04/09/22 0809  BP: (!) 149/85 (!) 157/52 (!) 144/49   Pulse: (!) 58 (!) 57 (!) 57   Resp: '18 20 18   '$ Temp: 98 F (36.7 C) 97.6 F (36.4 C) 98.1 F (36.7 C)   TempSrc: Oral Oral    SpO2: 97% 98% 97% 98%  Weight:      Height:        Intake/Output Summary (Last 24 hours) at 04/09/2022 1027 Last data filed at 04/09/2022 0542 Gross per 24 hour  Intake 440 ml  Output 350 ml  Net 90 ml   Filed Weights   03/20/2022 1932  Weight: 94 kg    Examination:  General exam: Appears comfortable, deconditioned, not in any distress. Respiratory system: CTA bilaterally. Respiratory effort normal. RR 13 Cardiovascular system: S1 & S2 heard, regular rate and rhythm, no murmur. Gastrointestinal system: Abdomen is soft, non distended, non tender, BS+ Central nervous system: Alert and oriented x 2, slurring of his speech at baseline. Extremities: No edema, no cyanosis, no clubbing. Skin: No rashes, lesions or ulcers Psychiatry: Mood and affect  > Appropriate.    Data Reviewed: I have personally reviewed following labs and imaging studies  CBC: Recent Labs  Lab 04/06/2022 1936 04/05/22 0439 04/07/22 0340 04/08/22 0605  WBC 15.5* 14.4* 14.6* 12.5*  HGB 15.8 14.7 16.1 15.0  HCT 48.9 45.0 49.4 45.5  MCV 92.3 91.3 91.7 90.1  PLT 141* 126* 155 606   Basic Metabolic Panel: Recent Labs  Lab 04/14/2022 1936 04/08/22 0605 04/09/22 0432  NA 138 139  --   K 4.2 3.1* 4.2  CL 101 104  --   CO2 27 24  --   GLUCOSE 115* 116*  --   BUN 19 37*  --   CREATININE 0.84 0.83  --   CALCIUM 9.3 8.8*  --   MG  --  2.1  --   PHOS  --  3.5  --    GFR: Estimated Creatinine Clearance: 72.4 mL/min (by C-G formula based on SCr of 0.83 mg/dL). Liver Function Tests: No results for input(s): "AST", "ALT", "ALKPHOS",  "BILITOT", "PROT", "ALBUMIN" in the last 168 hours. No results for input(s): "LIPASE", "AMYLASE" in the last 168 hours. No results for input(s): "AMMONIA" in the last 168 hours. Coagulation Profile: Recent Labs  Lab 04/16/2022 1936  INR 1.3*   Cardiac Enzymes: No results for input(s): "CKTOTAL", "CKMB", "CKMBINDEX", "TROPONINI" in the last 168 hours. BNP (last 3 results) No results for input(s): "PROBNP" in the last 8760 hours. HbA1C: No results for input(s): "HGBA1C" in the last 72 hours. CBG: Recent Labs  Lab 04/05/22 0451 04/06/22 0739 04/07/22 0457 04/07/22 1957 04/08/22 0754  GLUCAP 119* 127* 117* 137* 122*   Lipid Profile: No results for input(s): "CHOL", "HDL", "LDLCALC", "TRIG", "CHOLHDL", "LDLDIRECT" in the last 72 hours. Thyroid Function Tests: No results for input(s): "TSH", "T4TOTAL", "FREET4", "T3FREE", "THYROIDAB" in the last 72 hours. Anemia Panel: No results for input(s): "VITAMINB12", "FOLATE", "FERRITIN", "TIBC", "IRON", "RETICCTPCT" in the last 72 hours. Sepsis Labs: No results for input(s): "PROCALCITON", "LATICACIDVEN" in the last 168 hours.  Recent Results (from the past 240 hour(s))  Resp panel by RT-PCR (RSV, Flu A&B, Covid) Anterior Nasal Swab     Status: None   Collection Time:  04/05/22  2:07 PM   Specimen: Anterior Nasal Swab  Result Value Ref Range Status   SARS Coronavirus 2 by RT PCR NEGATIVE NEGATIVE Final    Comment: (NOTE) SARS-CoV-2 target nucleic acids are NOT DETECTED.  The SARS-CoV-2 RNA is generally detectable in upper respiratory specimens during the acute phase of infection. The lowest concentration of SARS-CoV-2 viral copies this assay can detect is 138 copies/mL. A negative result does not preclude SARS-Cov-2 infection and should not be used as the sole basis for treatment or other patient management decisions. A negative result may occur with  improper specimen collection/handling, submission of specimen other than  nasopharyngeal swab, presence of viral mutation(s) within the areas targeted by this assay, and inadequate number of viral copies(<138 copies/mL). A negative result must be combined with clinical observations, patient history, and epidemiological information. The expected result is Negative.  Fact Sheet for Patients:  EntrepreneurPulse.com.au  Fact Sheet for Healthcare Providers:  IncredibleEmployment.be  This test is no t yet approved or cleared by the [place] FDA and  has been authorized for detection and/or diagnosis of SARS-CoV-2 by FDA under an Emergency Use Authorization (EUA). This EUA will remain  in effect (meaning this test can be used) for the duration of the COVID-19 declaration under Section 564(b)(1) of the Act, 21 U.S.C.section 360bbb-3(b)(1), unless the authorization is terminated  or revoked sooner.       Influenza A by PCR NEGATIVE NEGATIVE Final   Influenza B by PCR NEGATIVE NEGATIVE Final    Comment: (NOTE) The Xpert Xpress SARS-CoV-2/FLU/RSV plus assay is intended as an aid in the diagnosis of influenza from Nasopharyngeal swab specimens and should not be used as a sole basis for treatment. Nasal washings and aspirates are unacceptable for Xpert Xpress SARS-CoV-2/FLU/RSV testing.  Fact Sheet for Patients: EntrepreneurPulse.com.au  Fact Sheet for Healthcare Providers: IncredibleEmployment.be  This test is not yet approved or cleared by the [place] FDA and has been authorized for detection and/or diagnosis of SARS-CoV-2 by FDA under an Emergency Use Authorization (EUA). This EUA will remain in effect (meaning this test can be used) for the duration of the COVID-19 declaration under Section 564(b)(1) of the Act, 21 U.S.C. section 360bbb-3(b)(1), unless the authorization is terminated or revoked.     Resp Syncytial Virus by PCR NEGATIVE NEGATIVE Final    Comment:  (NOTE) Fact Sheet for Patients: EntrepreneurPulse.com.au  Fact Sheet for Healthcare Providers: IncredibleEmployment.be  This test is not yet approved or cleared by the [place] FDA and has been authorized for detection and/or diagnosis of SARS-CoV-2 by FDA under an Emergency Use Authorization (EUA). This EUA will remain in effect (meaning this test can be used) for the duration of the COVID-19 declaration under Section 564(b)(1) of the Act, 21 U.S.C. section 360bbb-3(b)(1), unless the authorization is terminated or revoked.  Performed at [facility], [place], [place] [address]   Respiratory (~20 pathogens) panel by PCR     Status: None   Collection Time: 04/05/22  2:07 PM   Specimen: Nasopharyngeal Swab; Respiratory  Result Value Ref Range Status   Adenovirus NOT DETECTED NOT DETECTED Final   Coronavirus 229E NOT DETECTED NOT DETECTED Final    Comment: (NOTE) The Coronavirus on the Respiratory Panel, DOES NOT test for the novel  Coronavirus (2019 nCoV)    Coronavirus HKU1 NOT DETECTED NOT DETECTED Final   Coronavirus NL63 NOT DETECTED NOT DETECTED Final   Coronavirus OC43 NOT DETECTED NOT DETECTED Final   Metapneumovirus  NOT DETECTED NOT DETECTED Final   Rhinovirus / Enterovirus NOT DETECTED NOT DETECTED Final   Influenza A NOT DETECTED NOT DETECTED Final   Influenza B NOT DETECTED NOT DETECTED Final   Parainfluenza Virus 1 NOT DETECTED NOT DETECTED Final   Parainfluenza Virus 2 NOT DETECTED NOT DETECTED Final   Parainfluenza Virus 3 NOT DETECTED NOT DETECTED Final   Parainfluenza Virus 4 NOT DETECTED NOT DETECTED Final   Respiratory Syncytial Virus NOT DETECTED NOT DETECTED Final   Bordetella pertussis NOT DETECTED NOT DETECTED Final   Bordetella Parapertussis NOT DETECTED NOT DETECTED Final   Chlamydophila pneumoniae NOT DETECTED NOT DETECTED Final   Mycoplasma pneumoniae NOT DETECTED NOT DETECTED Final     Comment: Performed at [facility], [place] [address] [place], [place] [address]    Radiology Studies: No results found.  Scheduled Meds:  amLODipine  2.5 mg Oral Daily   arformoterol  15 mcg Nebulization BID   And   umeclidinium bromide  1 puff Inhalation Daily   buPROPion  150 mg Oral BID   carbidopa-levodopa  0.5 tablet Oral QID   entacapone  200 mg Oral TID   famotidine  20 mg Oral BID   feeding supplement (NEPRO CARB STEADY)  237 mL Oral TID BM   furosemide  40 mg Oral Daily   levothyroxine  50 mcg Oral Q0600   metoprolol tartrate  25 mg Oral BID   rosuvastatin  10 mg Oral Daily   sertraline  50 mg Oral Daily   sodium chloride flush  3 mL Intravenous Q12H   Continuous Infusions:   LOS: 5 days    Time spent: 35 mins    Wanza Szumski, MD [facility]   If 7PM-7AM, please contact night-coverage Creatinine is stable.

## 2022-04-09 NOTE — Progress Notes (Signed)
SLP F/u Note  Patient Details Name: Andre [name] MRN: [id number] DOB: 17-Apr-1936   Cancelled treatment:       Reason Eval/Treat Not Completed:  (chart reviewed; consulted NSG re: pt's status today) Per discussion w/ NSG, pt is tolerating thin liquids("loves water") adequately w/ no overt s/s of aspiration; no pulmonary decline observed per pt/chart(he is on RA, afebrile).  D/t his fluctuating mental status, recommend continue w/ the pureed consistency foods w/ the thin liquids(diet) w/ aspiration precautions and feeding Supervision at meals. Recommend f/u at next venue of care -- see tx note from yesterday.  NSG agreed.     Orinda Kenner, MS, CCC-SLP Speech Language Pathologist Rehab Services; [place] [phone] (ascom) [name] 04/09/2022, 1:01 PM

## 2022-04-10 DIAGNOSIS — S065XAA Traumatic subdural hemorrhage with loss of consciousness status unknown, initial encounter: Secondary | ICD-10-CM | POA: Diagnosis not present

## 2022-04-10 NOTE — Progress Notes (Signed)
PROGRESS NOTE    Andre [name]  ONG:[id number] DOB: 1936-03-27 DOA: 03/31/2022 PCP: [name], MD    Brief Narrative:  This 86 yrs old community dwelling white male with PMH significant for CAD s/p CABG, paroxysmal A-fib on Eliquis,, parkinsonism with Parkinson's dementia, OSA on CPAP, HFpEF and frequent falls who was brought to the ED with concerns for increased confusion and agitation that has been ongoing but worse over the past week.  Wife reports patient has been falling and last fell 2 days ago,  hitting his head.  CT head showed an acute subdural hemorrhage with minimal left-to-right midline shift.  Patient was evaluated by neurosurgery Dr. Cari Caraway who is stated patient is not a surgical candidate given his dementia and frailty.  Patient was given Kcentra for Eliquis reversal.  Patient is started on Cardene drip for hypertension.  Repeat CT head shows stable subdural hematoma..  Assessment & Plan:   Principal Problem:   Subdural hematoma (HCC) Active Problems:   OSA on CPAP   PAF (paroxysmal atrial fibrillation) (HCC)   Thrombocytopenia (HCC)   Parkinson's disease   Coronary artery disease involving native coronary artery of native heart with angina pectoris (HCC)   Essential hypertension   Hypothyroidism   COPD (chronic obstructive pulmonary disease) (HCC)   Slurred speech   Delirium due to multiple etiologies, acute, hyperactive   Dementia associated with Parkinson's disease ([place])   Frequent falls   Chronic anticoagulation   Hypertensive urgency   Chronic diastolic CHF (congestive heart failure) (HCC)   SDH (subdural hematoma) (HCC)   Subdural hematoma in the setting of chronic anticoagulation and recurrent falls. Eliquis was reversed with Kcentra.  Discontinue Eliquis Neurosurgery evaluated the patient.  Not a candidate for surgical intervention. Advised Blood pressure control to maintain systolic under 440. Repeat CT head shows stable subdural  hematoma. Continue neurologic checks. Continue Fall precautions. Not a surgical candidate for resection of subdural hematoma given his underlying dementia.   Recommended conservative management with medical management of his agitation.   Continue PT and OT eval.  No further scans. Repeat CT head in 3 to 4 weeks. Patient is alert, awake, and following commands, back to his baseline.   Chronic Diastolic CHF: Appears euvolemic. Continue furosemide, losartan and metoprolol.   Hypertensive urgency: Patient presented with systolic BP above 102 on arrival. Started on Cardene drip for maintaining systolic BP below 725. Continue metoprolol 25 mg every 12 hours, amlodipine 2.5 mg daily BP improved.   Delirium due to multiple etiologies: Parkinson's dementia: Continue Haldol as needed. Delirium precautions  Slurred Speech: Appears subacute to chronic per discussion with wife. No acute findings on CT head to suggest CVA. Frequent neurochecks.   COPD: No acute exacerbation. Continue Stiolto Respimat with as needed albuterol.   CAD: Troponin 11 and EKG nonacute. Continue metoprolol.  Continue rosuvastatin,  Eliquis discontinued.   Consider aspirin to replace Eliquis at discharge   Parkinson's disease: Continue Sinemet and Comtan.  Obstructive sleep apnea on CPAP: Continue CPAP nightly   DVT prophylaxis: SCDs Code Status: DNR Family Communication: No family at bed side. Disposition Plan:   Status is: Inpatient Remains inpatient appropriate because: Admitted for altered mentation secondary to subdural hematoma.  Repeat CT head shows stable hematoma.  Neurosurgery no surgical intervention.  PT and OT recommended skilled Nursing facility , awaiting insurance authorization.   Consultants:  Neurosurgery  Procedures: Repeat CT head Antimicrobials: None.  Subjective: Patient was seen and examined at bedside.  Overnight events  noted. Patient appears to be at his baseline mental  status.  No focal deficits noted.  He is following commands. Patient is awaiting insurance authorization for SNF placement.  Objective: Vitals:   04/09/22 2322 04/10/22 0401 04/10/22 0500 04/10/22 0852  BP: (!) 142/62 136/76  (!) 144/62  Pulse: (!) 55 (!) 57  63  Resp: '18 18  20  '$ Temp: 97.7 F (36.5 C) (!) 97.3 F (36.3 C)  (!) 97.5 F (36.4 C)  TempSrc:    Axillary  SpO2: 94% 97%  97%  Weight:   94.3 kg   Height:        Intake/Output Summary (Last 24 hours) at 04/10/2022 1058 Last data filed at 04/10/2022 0600 Gross per 24 hour  Intake 190 ml  Output 950 ml  Net -760 ml   Filed Weights   04/08/2022 1932 04/10/22 0500  Weight: 94 kg 94.3 kg    Examination:  General exam: Appears comfortable, deconditioned, not in any distress. Respiratory system: CTA bilaterally. Respiratory effort normal. RR 13 Cardiovascular system: S1 & S2 heard, regular rate and rhythm, no murmur. Gastrointestinal system: Abdomen is soft, non distended, non tender, BS+ Central nervous system: Alert and oriented x 2, slurring of his speech at baseline. Extremities: No edema, no cyanosis, no clubbing. Skin: No rashes, lesions or ulcers Psychiatry: Mood and affect  > Appropriate.    Data Reviewed: I have personally reviewed following labs and imaging studies  CBC: Recent Labs  Lab 04/05/2022 1936 04/05/22 0439 04/07/22 0340 04/08/22 0605  WBC 15.5* 14.4* 14.6* 12.5*  HGB 15.8 14.7 16.1 15.0  HCT 48.9 45.0 49.4 45.5  MCV 92.3 91.3 91.7 90.1  PLT 141* 126* 155 264   Basic Metabolic Panel: Recent Labs  Lab 04/05/2022 1936 04/08/22 0605 04/09/22 0432  NA 138 139  --   K 4.2 3.1* 4.2  CL 101 104  --   CO2 27 24  --   GLUCOSE 115* 116*  --   BUN 19 37*  --   CREATININE 0.84 0.83  --   CALCIUM 9.3 8.8*  --   MG  --  2.1  --   PHOS  --  3.5  --    GFR: Estimated Creatinine Clearance: 72.5 mL/min (by C-G formula based on SCr of 0.83 mg/dL). Liver Function Tests: No results for  input(s): "AST", "ALT", "ALKPHOS", "BILITOT", "PROT", "ALBUMIN" in the last 168 hours. No results for input(s): "LIPASE", "AMYLASE" in the last 168 hours. No results for input(s): "AMMONIA" in the last 168 hours. Coagulation Profile: Recent Labs  Lab 04/08/2022 1936  INR 1.3*   Cardiac Enzymes: No results for input(s): "CKTOTAL", "CKMB", "CKMBINDEX", "TROPONINI" in the last 168 hours. BNP (last 3 results) No results for input(s): "PROBNP" in the last 8760 hours. HbA1C: No results for input(s): "HGBA1C" in the last 72 hours. CBG: Recent Labs  Lab 04/05/22 0451 04/06/22 0739 04/07/22 0457 04/07/22 1957 04/08/22 0754  GLUCAP 119* 127* 117* 137* 122*   Lipid Profile: No results for input(s): "CHOL", "HDL", "LDLCALC", "TRIG", "CHOLHDL", "LDLDIRECT" in the last 72 hours. Thyroid Function Tests: No results for input(s): "TSH", "T4TOTAL", "FREET4", "T3FREE", "THYROIDAB" in the last 72 hours. Anemia Panel: No results for input(s): "VITAMINB12", "FOLATE", "FERRITIN", "TIBC", "IRON", "RETICCTPCT" in the last 72 hours. Sepsis Labs: No results for input(s): "PROCALCITON", "LATICACIDVEN" in the last 168 hours.  Recent Results (from the past 240 hour(s))  Resp panel by RT-PCR (RSV, Flu A&B, Covid) Anterior Nasal Swab  Status: None   Collection Time: 04/05/22  2:07 PM   Specimen: Anterior Nasal Swab  Result Value Ref Range Status   SARS Coronavirus 2 by RT PCR NEGATIVE NEGATIVE Final    Comment: (NOTE) SARS-CoV-2 target nucleic acids are NOT DETECTED.  The SARS-CoV-2 RNA is generally detectable in upper respiratory specimens during the acute phase of infection. The lowest concentration of SARS-CoV-2 viral copies this assay can detect is 138 copies/mL. A negative result does not preclude SARS-Cov-2 infection and should not be used as the sole basis for treatment or other patient management decisions. A negative result may occur with  improper specimen collection/handling, submission  of specimen other than nasopharyngeal swab, presence of viral mutation(s) within the areas targeted by this assay, and inadequate number of viral copies(<138 copies/mL). A negative result must be combined with clinical observations, patient history, and epidemiological information. The expected result is Negative.  Fact Sheet for Patients:  EntrepreneurPulse.com.au  Fact Sheet for Healthcare Providers:  IncredibleEmployment.be  This test is no t yet approved or cleared by the [place] FDA and  has been authorized for detection and/or diagnosis of SARS-CoV-2 by FDA under an Emergency Use Authorization (EUA). This EUA will remain  in effect (meaning this test can be used) for the duration of the COVID-19 declaration under Section 564(b)(1) of the Act, 21 U.S.C.section 360bbb-3(b)(1), unless the authorization is terminated  or revoked sooner.       Influenza A by PCR NEGATIVE NEGATIVE Final   Influenza B by PCR NEGATIVE NEGATIVE Final    Comment: (NOTE) The Xpert Xpress SARS-CoV-2/FLU/RSV plus assay is intended as an aid in the diagnosis of influenza from Nasopharyngeal swab specimens and should not be used as a sole basis for treatment. Nasal washings and aspirates are unacceptable for Xpert Xpress SARS-CoV-2/FLU/RSV testing.  Fact Sheet for Patients: EntrepreneurPulse.com.au  Fact Sheet for Healthcare Providers: IncredibleEmployment.be  This test is not yet approved or cleared by the [place] FDA and has been authorized for detection and/or diagnosis of SARS-CoV-2 by FDA under an Emergency Use Authorization (EUA). This EUA will remain in effect (meaning this test can be used) for the duration of the COVID-19 declaration under Section 564(b)(1) of the Act, 21 U.S.C. section 360bbb-3(b)(1), unless the authorization is terminated or revoked.     Resp Syncytial Virus by PCR NEGATIVE NEGATIVE  Final    Comment: (NOTE) Fact Sheet for Patients: EntrepreneurPulse.com.au  Fact Sheet for Healthcare Providers: IncredibleEmployment.be  This test is not yet approved or cleared by the [place] FDA and has been authorized for detection and/or diagnosis of SARS-CoV-2 by FDA under an Emergency Use Authorization (EUA). This EUA will remain in effect (meaning this test can be used) for the duration of the COVID-19 declaration under Section 564(b)(1) of the Act, 21 U.S.C. section 360bbb-3(b)(1), unless the authorization is terminated or revoked.  Performed at [facility], [place], [place] [address]   Respiratory (~20 pathogens) panel by PCR     Status: None   Collection Time: 04/05/22  2:07 PM   Specimen: Nasopharyngeal Swab; Respiratory  Result Value Ref Range Status   Adenovirus NOT DETECTED NOT DETECTED Final   Coronavirus 229E NOT DETECTED NOT DETECTED Final    Comment: (NOTE) The Coronavirus on the Respiratory Panel, DOES NOT test for the novel  Coronavirus (2019 nCoV)    Coronavirus HKU1 NOT DETECTED NOT DETECTED Final   Coronavirus NL63 NOT DETECTED NOT DETECTED Final   Coronavirus OC43 NOT DETECTED  NOT DETECTED Final   Metapneumovirus NOT DETECTED NOT DETECTED Final   Rhinovirus / Enterovirus NOT DETECTED NOT DETECTED Final   Influenza A NOT DETECTED NOT DETECTED Final   Influenza B NOT DETECTED NOT DETECTED Final   Parainfluenza Virus 1 NOT DETECTED NOT DETECTED Final   Parainfluenza Virus 2 NOT DETECTED NOT DETECTED Final   Parainfluenza Virus 3 NOT DETECTED NOT DETECTED Final   Parainfluenza Virus 4 NOT DETECTED NOT DETECTED Final   Respiratory Syncytial Virus NOT DETECTED NOT DETECTED Final   Bordetella pertussis NOT DETECTED NOT DETECTED Final   Bordetella Parapertussis NOT DETECTED NOT DETECTED Final   Chlamydophila pneumoniae NOT DETECTED NOT DETECTED Final   Mycoplasma pneumoniae NOT DETECTED  NOT DETECTED Final    Comment: Performed at [facility], [place] [address] [place], [place] [address]    Radiology Studies: No results found.  Scheduled Meds:  amLODipine  5 mg Oral Daily   arformoterol  15 mcg Nebulization BID   And   umeclidinium bromide  1 puff Inhalation Daily   buPROPion  150 mg Oral BID   carbidopa-levodopa  0.5 tablet Oral QID   entacapone  200 mg Oral TID   famotidine  20 mg Oral BID   feeding supplement (NEPRO CARB STEADY)  237 mL Oral TID BM   furosemide  40 mg Oral Daily   levothyroxine  50 mcg Oral Q0600   metoprolol tartrate  25 mg Oral BID   rosuvastatin  10 mg Oral Daily   sertraline  50 mg Oral Daily   sodium chloride flush  3 mL Intravenous Q12H   Continuous Infusions:   LOS: 6 days    Time spent: 35 mins    Lannis Lichtenwalner, MD [facility]   If 7PM-7AM, please contact night-coverage Creatinine is stable.

## 2022-04-11 DIAGNOSIS — S065XAA Traumatic subdural hemorrhage with loss of consciousness status unknown, initial encounter: Secondary | ICD-10-CM | POA: Diagnosis not present

## 2022-04-11 NOTE — Progress Notes (Signed)
PROGRESS NOTE    Andre [name]  YSA:[id number] DOB: 01-12-1936 DOA: 04/15/2022 PCP: [name], MD    Brief Narrative:  This 86 yrs old community dwelling white male with PMH significant for CAD s/p CABG, paroxysmal A-fib on Eliquis,, parkinsonism with Parkinson's dementia, OSA on CPAP, HFpEF and frequent falls who was brought to the ED with concerns for increased confusion and agitation that has been ongoing but worse over the past week.  Wife reports Andre [name] has been falling and last fell 2 days ago,  hitting his head.  CT head showed an acute subdural hemorrhage with minimal left-to-right midline shift.  Andre [name] was evaluated by neurosurgery Dr. Cari Caraway who is stated Andre [name] is not a surgical candidate given his dementia and frailty.  Andre [name] was given Kcentra for Eliquis reversal.  Andre [name] is started on Cardene drip for hypertension.  Repeat CT head shows stable subdural hematoma.  Assessment & Plan:   Principal Problem:   Subdural hematoma (HCC) Active Problems:   OSA on CPAP   PAF (paroxysmal atrial fibrillation) (HCC)   Thrombocytopenia (HCC)   Parkinson's disease   Coronary artery disease involving native coronary artery of native heart with angina pectoris (HCC)   Essential hypertension   Hypothyroidism   COPD (chronic obstructive pulmonary disease) (HCC)   Slurred speech   Delirium due to multiple etiologies, acute, hyperactive   Dementia associated with Parkinson's disease ([place])   Frequent falls   Chronic anticoagulation   Hypertensive urgency   Chronic diastolic CHF (congestive heart failure) (HCC)   SDH (subdural hematoma) (HCC)   Subdural hematoma in the setting of chronic anticoagulation and recurrent falls. Eliquis was reversed with Kcentra.  Discontinue Eliquis Neurosurgery evaluated the Andre [name].  Not a candidate for surgical intervention. Advised Blood pressure control to maintain systolic under 323. Repeat CT head shows stable subdural  hematoma. Continue neurologic checks. Continue Fall precautions. Not a surgical candidate for resection of subdural hematoma given his underlying dementia.   Recommended conservative management with medical management of his agitation.   Continue PT and OT eval.  No further scans. Repeat CT head in 3 to 4 weeks. Andre [name] is alert, awake, and following commands, back to his baseline.   Chronic Diastolic CHF: Appears euvolemic. Continue furosemide, losartan and metoprolol.   Hypertensive urgency: Andre [name] presented with systolic BP above 557 on arrival. Started on Cardene drip for maintaining systolic BP below 322. Continue metoprolol 25 mg every 12 hours, amlodipine 2.5 mg daily BP improved.   Delirium due to multiple etiologies: Parkinson's dementia: Continue Haldol as needed. Delirium precautions  Slurred Speech: Appears subacute to chronic per discussion with wife. No acute findings on CT head to suggest CVA. Frequent neurochecks.   COPD: No acute exacerbation. Continue Stiolto Respimat with as needed albuterol.   CAD: Troponin 11 and EKG nonacute. Continue metoprolol.  Continue rosuvastatin,  Eliquis discontinued.   Consider aspirin to replace Eliquis at discharge   Parkinson's disease: Continue Sinemet and Comtan.  Obstructive sleep apnea on CPAP: Continue CPAP nightly   DVT prophylaxis: SCDs Code Status: DNR Family Communication: No family at bed side. Disposition Plan:   Status is: Inpatient Remains inpatient appropriate because: Admitted for altered mentation secondary to subdural hematoma.  Repeat CT head shows stable hematoma.  Neurosurgery no surgical intervention.  PT and OT recommended skilled Nursing facility , awaiting insurance authorization.   Consultants:  Neurosurgery  Procedures: Repeat CT head Antimicrobials: None.  Subjective: Andre [name] was seen and examined at bedside.  Overnight events  noted. Andre [name] appears to be at his baseline mental  status.  No focal deficits noted.  He is following commands. Andre [name] is awaiting insurance authorization for SNF placement.  Objective: Vitals:   04/11/22 0019 04/11/22 0413 04/11/22 0754 04/11/22 0758  BP: (!) 124/58 132/87  138/72  Pulse: (!) 59 (!) 58  62  Resp: '18 18  18  '$ Temp: 98.7 F (37.1 C) 97.7 F (36.5 C)  98.5 F (36.9 C)  TempSrc:    Oral  SpO2: 100% 100% 95% 95%  Weight:      Height:        Intake/Output Summary (Last 24 hours) at 04/11/2022 1134 Last data filed at 04/11/2022 1018 Gross per 24 hour  Intake 654 ml  Output 500 ml  Net 154 ml   Filed Weights   03/25/2022 1932 04/10/22 0500  Weight: 94 kg 94.3 kg    Examination:  General exam: Appears comfortable, deconditioned, not in any distress. Respiratory system: CTA bilaterally. Respiratory effort normal. RR 13 Cardiovascular system: S1 & S2 heard, regular rate and rhythm, no murmur. Gastrointestinal system: Abdomen is soft, non distended, non tender, BS+ Central nervous system: Alert and oriented x 2, slurring of his speech at baseline. Extremities: No edema, no cyanosis, no clubbing. Skin: No rashes, lesions or ulcers Psychiatry: Mood and affect  > Appropriate.    Data Reviewed: I have personally reviewed following labs and imaging studies  CBC: Recent Labs  Lab 03/28/2022 1936 04/05/22 0439 04/07/22 0340 04/08/22 0605  WBC 15.5* 14.4* 14.6* 12.5*  HGB 15.8 14.7 16.1 15.0  HCT 48.9 45.0 49.4 45.5  MCV 92.3 91.3 91.7 90.1  PLT 141* 126* 155 841   Basic Metabolic Panel: Recent Labs  Lab 03/18/2022 1936 04/08/22 0605 04/09/22 0432  NA 138 139  --   K 4.2 3.1* 4.2  CL 101 104  --   CO2 27 24  --   GLUCOSE 115* 116*  --   BUN 19 37*  --   CREATININE 0.84 0.83  --   CALCIUM 9.3 8.8*  --   MG  --  2.1  --   PHOS  --  3.5  --    GFR: Estimated Creatinine Clearance: 72.5 mL/min (by C-G formula based on SCr of 0.83 mg/dL). Liver Function Tests: No results for input(s): "AST", "ALT",  "ALKPHOS", "BILITOT", "PROT", "ALBUMIN" in the last 168 hours. No results for input(s): "LIPASE", "AMYLASE" in the last 168 hours. No results for input(s): "AMMONIA" in the last 168 hours. Coagulation Profile: Recent Labs  Lab 03/30/2022 1936  INR 1.3*   Cardiac Enzymes: No results for input(s): "CKTOTAL", "CKMB", "CKMBINDEX", "TROPONINI" in the last 168 hours. BNP (last 3 results) No results for input(s): "PROBNP" in the last 8760 hours. HbA1C: No results for input(s): "HGBA1C" in the last 72 hours. CBG: Recent Labs  Lab 04/05/22 0451 04/06/22 0739 04/07/22 0457 04/07/22 1957 04/08/22 0754  GLUCAP 119* 127* 117* 137* 122*   Lipid Profile: No results for input(s): "CHOL", "HDL", "LDLCALC", "TRIG", "CHOLHDL", "LDLDIRECT" in the last 72 hours. Thyroid Function Tests: No results for input(s): "TSH", "T4TOTAL", "FREET4", "T3FREE", "THYROIDAB" in the last 72 hours. Anemia Panel: No results for input(s): "VITAMINB12", "FOLATE", "FERRITIN", "TIBC", "IRON", "RETICCTPCT" in the last 72 hours. Sepsis Labs: No results for input(s): "PROCALCITON", "LATICACIDVEN" in the last 168 hours.  Recent Results (from the past 240 hour(s))  Resp panel by RT-PCR (RSV, Flu A&B, Covid) Anterior Nasal Swab     Status: None  Collection Time: 04/05/22  2:07 PM   Specimen: Anterior Nasal Swab  Result Value Ref Range Status   SARS Coronavirus 2 by RT PCR NEGATIVE NEGATIVE Final    Comment: (NOTE) SARS-CoV-2 target nucleic acids are NOT DETECTED.  The SARS-CoV-2 RNA is generally detectable in upper respiratory specimens during the acute phase of infection. The lowest concentration of SARS-CoV-2 viral copies this assay can detect is 138 copies/mL. A negative result does not preclude SARS-Cov-2 infection and should not be used as the sole basis for treatment or other Andre [name] management decisions. A negative result may occur with  improper specimen collection/handling, submission of specimen other than  nasopharyngeal swab, presence of viral mutation(s) within the areas targeted by this assay, and inadequate number of viral copies(<138 copies/mL). A negative result must be combined with clinical observations, Andre [name] history, and epidemiological information. The expected result is Negative.  Fact Sheet for Patients:  EntrepreneurPulse.com.au  Fact Sheet for Healthcare Providers:  IncredibleEmployment.be  This test is no t yet approved or cleared by the [place] FDA and  has been authorized for detection and/or diagnosis of SARS-CoV-2 by FDA under an Emergency Use Authorization (EUA). This EUA will remain  in effect (meaning this test can be used) for the duration of the COVID-19 declaration under Section 564(b)(1) of the Act, 21 U.S.C.section 360bbb-3(b)(1), unless the authorization is terminated  or revoked sooner.       Influenza A by PCR NEGATIVE NEGATIVE Final   Influenza B by PCR NEGATIVE NEGATIVE Final    Comment: (NOTE) The Xpert Xpress SARS-CoV-2/FLU/RSV plus assay is intended as an aid in the diagnosis of influenza from Nasopharyngeal swab specimens and should not be used as a sole basis for treatment. Nasal washings and aspirates are unacceptable for Xpert Xpress SARS-CoV-2/FLU/RSV testing.  Fact Sheet for Patients: EntrepreneurPulse.com.au  Fact Sheet for Healthcare Providers: IncredibleEmployment.be  This test is not yet approved or cleared by the [place] FDA and has been authorized for detection and/or diagnosis of SARS-CoV-2 by FDA under an Emergency Use Authorization (EUA). This EUA will remain in effect (meaning this test can be used) for the duration of the COVID-19 declaration under Section 564(b)(1) of the Act, 21 U.S.C. section 360bbb-3(b)(1), unless the authorization is terminated or revoked.     Resp Syncytial Virus by PCR NEGATIVE NEGATIVE Final    Comment:  (NOTE) Fact Sheet for Patients: EntrepreneurPulse.com.au  Fact Sheet for Healthcare Providers: IncredibleEmployment.be  This test is not yet approved or cleared by the [place] FDA and has been authorized for detection and/or diagnosis of SARS-CoV-2 by FDA under an Emergency Use Authorization (EUA). This EUA will remain in effect (meaning this test can be used) for the duration of the COVID-19 declaration under Section 564(b)(1) of the Act, 21 U.S.C. section 360bbb-3(b)(1), unless the authorization is terminated or revoked.  Performed at [facility], [place],  [address]   Respiratory (~20 pathogens) panel by PCR     Status: None   Collection Time: 04/05/22  2:07 PM   Specimen: Nasopharyngeal Swab; Respiratory  Result Value Ref Range Status   Adenovirus NOT DETECTED NOT DETECTED Final   Coronavirus 229E NOT DETECTED NOT DETECTED Final    Comment: (NOTE) The Coronavirus on the Respiratory Panel, DOES NOT test for the novel  Coronavirus (2019 nCoV)    Coronavirus HKU1 NOT DETECTED NOT DETECTED Final   Coronavirus NL63 NOT DETECTED NOT DETECTED Final   Coronavirus OC43 NOT DETECTED NOT DETECTED Final  Metapneumovirus NOT DETECTED NOT DETECTED Final   Rhinovirus / Enterovirus NOT DETECTED NOT DETECTED Final   Influenza A NOT DETECTED NOT DETECTED Final   Influenza B NOT DETECTED NOT DETECTED Final   Parainfluenza Virus 1 NOT DETECTED NOT DETECTED Final   Parainfluenza Virus 2 NOT DETECTED NOT DETECTED Final   Parainfluenza Virus 3 NOT DETECTED NOT DETECTED Final   Parainfluenza Virus 4 NOT DETECTED NOT DETECTED Final   Respiratory Syncytial Virus NOT DETECTED NOT DETECTED Final   Bordetella pertussis NOT DETECTED NOT DETECTED Final   Bordetella Parapertussis NOT DETECTED NOT DETECTED Final   Chlamydophila pneumoniae NOT DETECTED NOT DETECTED Final   Mycoplasma pneumoniae NOT DETECTED NOT DETECTED Final     Comment: Performed at [facility], [place] [address], [place], [place] [address]    Radiology Studies: No results found.  Scheduled Meds:  amLODipine  5 mg Oral Daily   arformoterol  15 mcg Nebulization BID   And   umeclidinium bromide  1 puff Inhalation Daily   buPROPion  150 mg Oral BID   carbidopa-levodopa  0.5 tablet Oral QID   entacapone  200 mg Oral TID   famotidine  20 mg Oral BID   feeding supplement (NEPRO CARB STEADY)  237 mL Oral TID BM   furosemide  40 mg Oral Daily   levothyroxine  50 mcg Oral Q0600   metoprolol tartrate  25 mg Oral BID   rosuvastatin  10 mg Oral Daily   sertraline  50 mg Oral Daily   sodium chloride flush  3 mL Intravenous Q12H   Continuous Infusions:   LOS: 7 days    Time spent: 35 mins    Alhassan Everingham, MD [facility]   If 7PM-7AM, please contact night-coverage Creatinine is stable.

## 2022-04-12 ENCOUNTER — Inpatient Hospital Stay: Payer: HMO

## 2022-04-12 DIAGNOSIS — S065XAA Traumatic subdural hemorrhage with loss of consciousness status unknown, initial encounter: Secondary | ICD-10-CM | POA: Diagnosis not present

## 2022-04-12 LAB — URINALYSIS, ROUTINE W REFLEX MICROSCOPIC
Bilirubin Urine: NEGATIVE
Glucose, UA: NEGATIVE mg/dL
Ketones, ur: 5 mg/dL — AB
Nitrite: POSITIVE — AB
Protein, ur: 100 mg/dL — AB
Specific Gravity, Urine: 1.034 — ABNORMAL HIGH (ref 1.005–1.030)
WBC, UA: >50 WBC/hpf — ABNORMAL HIGH (ref 0–5)
pH: 5 (ref 5.0–8.0)

## 2022-04-12 LAB — BASIC METABOLIC PANEL
Anion gap: 9 (ref 5–15)
BUN: 41 mg/dL — ABNORMAL HIGH (ref 8–23)
CO2: 26 mmol/L (ref 22–32)
Calcium: 9.2 mg/dL (ref 8.9–10.3)
Chloride: 102 mmol/L (ref 98–111)
Creatinine, Ser: 1.19 mg/dL (ref 0.61–1.24)
GFR, Estimated: >60 mL/min (ref >60)
Glucose, Bld: 178 mg/dL — ABNORMAL HIGH (ref 70–99)
Potassium: 4.3 mmol/L (ref 3.5–5.1)
Sodium: 137 mmol/L (ref 135–145)

## 2022-04-12 LAB — CBC
HCT: 50.8 % (ref 39.0–52.0)
Hemoglobin: 16.3 g/dL (ref 13.0–17.0)
MCH: 29.6 pg (ref 26.0–34.0)
MCHC: 32.1 g/dL (ref 30.0–36.0)
MCV: 92.4 fL (ref 80.0–100.0)
Platelets: 202 10*3/uL (ref 150–400)
RBC: 5.5 MIL/uL (ref 4.22–5.81)
RDW: 13.9 % (ref 11.5–15.5)
WBC: 30.7 10*3/uL — ABNORMAL HIGH (ref 4.0–10.5)
nRBC: 0 % (ref 0.0–0.2)

## 2022-04-12 LAB — PHOSPHORUS: Phosphorus: 3.7 mg/dL (ref 2.5–4.6)

## 2022-04-12 LAB — MAGNESIUM: Magnesium: 2.4 mg/dL (ref 1.7–2.4)

## 2022-04-12 LAB — GLUCOSE, CAPILLARY: Glucose-Capillary: 200 mg/dL — ABNORMAL HIGH (ref 70–99)

## 2022-04-12 MED ORDER — SODIUM CHLORIDE 0.9 % IV SOLN
1.0000 g | INTRAVENOUS | Status: DC
  Administered 2022-04-12 – 2022-04-16 (×5): 1 g via INTRAVENOUS
  Filled 2022-04-12 (×3): qty 10
  Filled 2022-04-12: qty 1
  Filled 2022-04-12: qty 10

## 2022-04-12 NOTE — TOC Progression Note (Signed)
Transition of Care [facility]) - Progression Note    Patient Details  Name: Andre [name] MRN: [id number] Date of Birth: 04/16/1936  Transition of Care [facility]) CM/SW [place], [place] Phone Number: 04/12/2022, 9:34 AM  Clinical Narrative:     Patient has no SNF bed offers yet, CSW expanded bed search. Pending bed offers at this time.   Expected Discharge Plan: [place] Barriers to Discharge: Continued Medical Work up  Expected Discharge Plan and Services   Discharge Planning Services: CM Consult Post Acute Care Choice: [place] Living arrangements for the past 2 months: Single Family Home                                       Social Determinants of Health (SDOH) Interventions SDOH Screenings   Food Insecurity: No Food Insecurity (04/06/2022)  Housing: Low Risk  (04/06/2022)  Transportation Needs: No Transportation Needs (04/06/2022)  Utilities: Not At Risk (04/06/2022)  Depression (PHQ2-9): Low Risk  (01/18/2022)  Financial Resource Strain: Low Risk  (09/14/2021)  Physical Activity: Unknown (12/29/2017)  Social Connections: Unknown (09/14/2021)  Stress: No Stress Concern Present (09/14/2021)  Tobacco Use: Medium Risk (03/25/2022)    Readmission Risk Interventions     No data to display

## 2022-04-12 NOTE — Progress Notes (Addendum)
Physical Therapy Treatment Patient Details Name: Andre [name] MRN: [id number] DOB: December 01, 1935 Today's Date: 04/12/2022   History of Present Illness presented to ER from home secondary to worsening confusion, agitation; admitted for management of acute L frontal SDH (stable across imaging) with small (L to R) midline shift    PT Comments    Patient restless and has difficulty following commands for participation with PT intervention. Patient resistive at times with bed mobility efforts and required Max A just for rolling in bed. Multi-modal cues provided with functional activity and patient following less than 25% of single step commands this session. Assistance required with cues for participation with LE therapeutic exercises for strengthening. Slow progress overall. PT will continue to follow to maximize independence and decrease caregiver burden. SNF is recommended at discharge.    Recommendations for follow up therapy are one component of a multi-disciplinary discharge planning process, led by the attending physician.  Recommendations may be updated based on patient status, additional functional criteria and insurance authorization.  Follow Up Recommendations  Skilled nursing-short term rehab (<3 hours/day) Can patient physically be transported by private vehicle: No   Assistance Recommended at Discharge Frequent or constant Supervision/Assistance  Patient can return home with the following Two people to help with walking and/or transfers;Two people to help with bathing/dressing/bathroom   Equipment Recommendations   (to be determined at next level of care)    Recommendations for Other Services       Precautions / Restrictions Precautions Precautions: Fall Restrictions Weight Bearing Restrictions: No     Mobility  Bed Mobility Overal bed mobility: Needs Assistance Bed Mobility: Rolling Rolling: Max assist         General bed mobility comments: attempted to facilitate  sitting up on edge of bed, however patient resistive at times and does not participate despite multi-modal cues. Max A required for rolling to the left in bed and for repositioning up in the bed    Transfers                        Ambulation/Gait                   Stairs             Wheelchair Mobility    Modified Rankin (Stroke Patients Only)       Balance                                            Cognition Arousal/Alertness: Awake/alert Behavior During Therapy: Restless Overall Cognitive Status: No family/caregiver present to determine baseline cognitive functioning                                 General Comments: limited ability to follow commands during this session (less than 25%). patient does verbalize when his name is called, however speech is mostly unintelligable        Exercises General Exercises - Lower Extremity Ankle Circles/Pumps: AAROM, Strengthening, 5 reps, Supine Heel Slides: AAROM, Strengthening, Both, 10 reps, Supine Hip ABduction/ADduction: AAROM, Strengthening, Both, 5 reps, Supine Other Exercises Other Exercises: verbal cues for active participation for LE exercises for strengthening    General Comments        Pertinent Vitals/Pain Pain Assessment Pain Assessment: Faces Faces Pain Scale:  Hurts a little bit Pain Location: generalized Pain Descriptors / Indicators: Restless Pain Intervention(s): Monitored during session, Limited activity within patient's tolerance    Home Living                          Prior Function            PT Goals (current goals can now be found in the care plan section) Acute Rehab PT Goals Patient Stated Goal: patient unable to participate in goal setting PT Goal Formulation: Patient unable to participate in goal setting Time For Goal Achievement: 04/19/22 Potential to Achieve Goals: Fair Progress towards PT goals: Progressing toward  goals    Frequency    Min 2X/week      PT Plan Current plan remains appropriate    Co-evaluation              AM-PAC PT "6 Clicks" Mobility   Outcome Measure  Help needed turning from your back to your side while in a flat bed without using bedrails?: A Lot Help needed moving from lying on your back to sitting on the side of a flat bed without using bedrails?: A Lot Help needed moving to and from a bed to a chair (including a wheelchair)?: A Lot Help needed standing up from a chair using your arms (e.g., wheelchair or bedside chair)?: A Lot Help needed to walk in hospital room?: A Lot Help needed climbing 3-5 steps with a railing? : A Lot 6 Click Score: 12    End of Session   Activity Tolerance:  (limited cognition and ability to follow commands) Patient left: in bed;with call bell/phone within reach;with bed alarm set Nurse Communication: Mobility status (minimal participation) PT Visit Diagnosis: Muscle weakness (generalized) (M62.81);Difficulty in walking, not elsewhere classified (R26.2);Other symptoms and signs involving the nervous system (R29.898)     Time: 5809-9833 PT Time Calculation (min) (ACUTE ONLY): 12 min  Charges:  $Therapeutic Activity: 8-22 mins                     Minna Merritts, PT, MPT    Andre [name] 04/12/2022, 3:12 PM

## 2022-04-12 NOTE — Care Management Important Message (Signed)
Important Message  Patient Details  Name: Andre [name] MRN: [id number] Date of Birth: 02-Feb-1936   Medicare Important Message Given:  Yes     Dannette Barbara 04/12/2022, 11:45 AM

## 2022-04-12 NOTE — Progress Notes (Signed)
PROGRESS NOTE    Andre [name]  QPY:[id number] DOB: [birth year] DOA: 04/05/2022 PCP: [name], MD    Brief Narrative:  This 86 yrs old community dwelling white male with PMH significant for CAD s/p CABG, paroxysmal A-fib on Eliquis,, parkinsonism with Parkinson's dementia, OSA on CPAP, HFpEF and frequent falls who was brought to the ED with concerns for increased confusion and agitation that has been ongoing but worse over the past week.  Wife reports patient has been falling and last fell 2 days ago,  hitting his head.  CT head showed an acute subdural hemorrhage with minimal left-to-right midline shift.  Patient was evaluated by neurosurgery Dr. Cari Caraway who is stated patient is not a surgical candidate given his dementia and frailty.  Patient was given Kcentra for Eliquis reversal.  Patient is started on Cardene drip for hypertension.  Repeat CT head shows stable subdural hematoma.  Assessment & Plan:   Principal Problem:   Subdural hematoma (HCC) Active Problems:   OSA on CPAP   PAF (paroxysmal atrial fibrillation) (HCC)   Thrombocytopenia (HCC)   Parkinson's disease   Coronary artery disease involving native coronary artery of native heart with angina pectoris (HCC)   Essential hypertension   Hypothyroidism   COPD (chronic obstructive pulmonary disease) (HCC)   Slurred speech   Delirium due to multiple etiologies, acute, hyperactive   Dementia associated with Parkinson's disease ([place])   Frequent falls   Chronic anticoagulation   Hypertensive urgency   Chronic diastolic CHF (congestive heart failure) (HCC)   SDH (subdural hematoma) (HCC)   Subdural hematoma in the setting of chronic anticoagulation and recurrent falls. Eliquis was reversed with Kcentra.  Discontinue Eliquis Neurosurgery evaluated the patient.  Not a candidate for surgical intervention. Repeat CT head shows stable subdural hematoma. Continue neurologic checks. Continue Fall precautions. Not a  surgical candidate for resection of subdural hematoma given his underlying dementia.   Recommended conservative management with medical management of his agitation.   Continue PT and OT eval.  No further scans. Repeat CT head in 3 to 4 weeks. 12/26: Patient found confused, WBC spiked to 30 K, UA consistent with UTI. Start ceftriaxone, follow-up cultures.   Chronic Diastolic CHF: Appears euvolemic. Continue furosemide, losartan and metoprolol.   Hypertensive urgency: Patient presented with systolic BP above 124 on arrival. Started on Cardene drip for maintaining systolic BP below 580. Continue metoprolol 25 mg every 12 hours, amlodipine 2.5 mg daily BP improved.   Delirium due to multiple etiologies: Parkinson's dementia: Continue Haldol as needed. Delirium precautions  Slurred Speech: Appears subacute to chronic per discussion with wife. No acute findings on CT head to suggest CVA. Frequent neurochecks.   COPD: No acute exacerbation. Continue Stiolto Respimat with as needed albuterol.   CAD: Troponin 11 and EKG nonacute. Continue metoprolol.  Continue rosuvastatin,  Eliquis discontinued.   Consider aspirin to replace Eliquis at discharge   Parkinson's disease: Continue Sinemet and Comtan.  Obstructive sleep apnea on CPAP: Continue CPAP nightly.  Acute metabolic encephalopathy: Patient was found confused in the morning, WBC spiked to 30 K. Chest x-ray showed mild CHF UA positive for UTI.  Initiated on ceftriaxone Follow-up urine culture and de-escalate antibiotics.   DVT prophylaxis: SCDs Code Status: DNR Family Communication: No family at bed side. Disposition Plan:   Status is: Inpatient Remains inpatient appropriate because: Admitted for altered mentation secondary to subdural hematoma.  Repeat CT head shows stable hematoma.  Neurosurgery no surgical intervention.  PT and OT  recommended skilled Nursing facility , awaiting insurance authorization.    Consultants:  Neurosurgery  Procedures: Repeat CT head Antimicrobials: None.  Subjective: Patient was seen and examined at bedside.  Overnight events noted. He was found slightly confused in the morning, but following commands. Also found to have mild lower abdominal discomfort. Patient is awaiting insurance authorization for SNF placement.  Objective: Vitals:   04/11/22 2149 04/12/22 0548 04/12/22 0800 04/12/22 0804  BP: (!) 113/42 (!) 119/58 127/79 127/79  Pulse: 75 69 65 70  Resp:    20  Temp: 98.9 F (37.2 C) 98.9 F (37.2 C)  100.1 F (37.8 C)  TempSrc: Oral Oral  Oral  SpO2: 100% 100%  96%  Weight:      Height:        Intake/Output Summary (Last 24 hours) at 04/12/2022 1132 Last data filed at 04/12/2022 0800 Gross per 24 hour  Intake 237 ml  Output 800 ml  Net -563 ml   Filed Weights   04/14/2022 1932 04/10/22 0500  Weight: 94 kg 94.3 kg    Examination:  General exam: Appears comfortable, deconditioned, not in any distress. Respiratory system: CTA bilaterally. Respiratory effort normal. RR 13 Cardiovascular system: S1-S2 heard, regular rate and rhythm, no murmur. Gastrointestinal system: Abdomen is soft, non distended, non tender, BS+ Central nervous system: Alert and oriented x 2, slurring of his speech at baseline. Extremities: No edema, no cyanosis, no clubbing. Skin: No rashes, lesions or ulcers Psychiatry: Mood and affect  > Appropriate.    Data Reviewed: I have personally reviewed following labs and imaging studies  CBC: Recent Labs  Lab 04/07/22 0340 04/08/22 0605 04/12/22 0454  WBC 14.6* 12.5* 30.7*  HGB 16.1 15.0 16.3  HCT 49.4 45.5 50.8  MCV 91.7 90.1 92.4  PLT 155 155 938   Basic Metabolic Panel: Recent Labs  Lab 04/08/22 0605 04/09/22 0432 04/12/22 0454  NA 139  --  137  K 3.1* 4.2 4.3  CL 104  --  102  CO2 24  --  26  GLUCOSE 116*  --  178*  BUN 37*  --  41*  CREATININE 0.83  --  1.19  CALCIUM 8.8*  --  9.2  MG 2.1   --  2.4  PHOS 3.5  --  3.7   GFR: Estimated Creatinine Clearance: 50.6 mL/min (by C-G formula based on SCr of 1.19 mg/dL). Liver Function Tests: No results for input(s): "AST", "ALT", "ALKPHOS", "BILITOT", "PROT", "ALBUMIN" in the last 168 hours. No results for input(s): "LIPASE", "AMYLASE" in the last 168 hours. No results for input(s): "AMMONIA" in the last 168 hours. Coagulation Profile: No results for input(s): "INR", "PROTIME" in the last 168 hours.  Cardiac Enzymes: No results for input(s): "CKTOTAL", "CKMB", "CKMBINDEX", "TROPONINI" in the last 168 hours. BNP (last 3 results) No results for input(s): "PROBNP" in the last 8760 hours. HbA1C: No results for input(s): "HGBA1C" in the last 72 hours. CBG: Recent Labs  Lab 04/06/22 0739 04/07/22 0457 04/07/22 1957 04/08/22 0754 04/12/22 0358  GLUCAP 127* 117* 137* 122* 200*   Lipid Profile: No results for input(s): "CHOL", "HDL", "LDLCALC", "TRIG", "CHOLHDL", "LDLDIRECT" in the last 72 hours. Thyroid Function Tests: No results for input(s): "TSH", "T4TOTAL", "FREET4", "T3FREE", "THYROIDAB" in the last 72 hours. Anemia Panel: No results for input(s): "VITAMINB12", "FOLATE", "FERRITIN", "TIBC", "IRON", "RETICCTPCT" in the last 72 hours. Sepsis Labs: No results for input(s): "PROCALCITON", "LATICACIDVEN" in the last 168 hours.  Recent Results (from the past 240 hour(s))  Resp panel by RT-PCR (RSV, Flu A&B, Covid) Anterior Nasal Swab     Status: None   Collection Time: 04/05/22  2:07 PM   Specimen: Anterior Nasal Swab  Result Value Ref Range Status   SARS Coronavirus 2 by RT PCR NEGATIVE NEGATIVE Final    Comment: (NOTE) SARS-CoV-2 target nucleic acids are NOT DETECTED.  The SARS-CoV-2 RNA is generally detectable in upper respiratory specimens during the acute phase of infection. The lowest concentration of SARS-CoV-2 viral copies this assay can detect is 138 copies/mL. A negative result does not preclude  SARS-Cov-2 infection and should not be used as the sole basis for treatment or other patient management decisions. A negative result may occur with  improper specimen collection/handling, submission of specimen other than nasopharyngeal swab, presence of viral mutation(s) within the areas targeted by this assay, and inadequate number of viral copies(<138 copies/mL). A negative result must be combined with clinical observations, patient history, and epidemiological information. The expected result is Negative.  Fact Sheet for Patients:  EntrepreneurPulse.com.au  Fact Sheet for Healthcare Providers:  IncredibleEmployment.be  This test is no t yet approved or cleared by the [place] FDA and  has been authorized for detection and/or diagnosis of SARS-CoV-2 by FDA under an Emergency Use Authorization (EUA). This EUA will remain  in effect (meaning this test can be used) for the duration of the COVID-19 declaration under Section 564(b)(1) of the Act, 21 U.S.C.section 360bbb-3(b)(1), unless the authorization is terminated  or revoked sooner.       Influenza A by PCR NEGATIVE NEGATIVE Final   Influenza B by PCR NEGATIVE NEGATIVE Final    Comment: (NOTE) The Xpert Xpress SARS-CoV-2/FLU/RSV plus assay is intended as an aid in the diagnosis of influenza from Nasopharyngeal swab specimens and should not be used as a sole basis for treatment. Nasal washings and aspirates are unacceptable for Xpert Xpress SARS-CoV-2/FLU/RSV testing.  Fact Sheet for Patients: EntrepreneurPulse.com.au  Fact Sheet for Healthcare Providers: IncredibleEmployment.be  This test is not yet approved or cleared by the [place] FDA and has been authorized for detection and/or diagnosis of SARS-CoV-2 by FDA under an Emergency Use Authorization (EUA). This EUA will remain in effect (meaning this test can be used) for the duration of  the COVID-19 declaration under Section 564(b)(1) of the Act, 21 U.S.C. section 360bbb-3(b)(1), unless the authorization is terminated or revoked.     Resp Syncytial Virus by PCR NEGATIVE NEGATIVE Final    Comment: (NOTE) Fact Sheet for Patients: EntrepreneurPulse.com.au  Fact Sheet for Healthcare Providers: IncredibleEmployment.be  This test is not yet approved or cleared by the [place] FDA and has been authorized for detection and/or diagnosis of SARS-CoV-2 by FDA under an Emergency Use Authorization (EUA). This EUA will remain in effect (meaning this test can be used) for the duration of the COVID-19 declaration under Section 564(b)(1) of the Act, 21 U.S.C. section 360bbb-3(b)(1), unless the authorization is terminated or revoked.  Performed at [facility], [place], [place] [address]   Respiratory (~20 pathogens) panel by PCR     Status: None   Collection Time: 04/05/22  2:07 PM   Specimen: Nasopharyngeal Swab; Respiratory  Result Value Ref Range Status   Adenovirus NOT DETECTED NOT DETECTED Final   Coronavirus 229E NOT DETECTED NOT DETECTED Final    Comment: (NOTE) The Coronavirus on the Respiratory Panel, DOES NOT test for the novel  Coronavirus (2019 nCoV)    Coronavirus HKU1 NOT DETECTED NOT DETECTED Final  Coronavirus NL63 NOT DETECTED NOT DETECTED Final   Coronavirus OC43 NOT DETECTED NOT DETECTED Final   Metapneumovirus NOT DETECTED NOT DETECTED Final   Rhinovirus / Enterovirus NOT DETECTED NOT DETECTED Final   Influenza A NOT DETECTED NOT DETECTED Final   Influenza B NOT DETECTED NOT DETECTED Final   Parainfluenza Virus 1 NOT DETECTED NOT DETECTED Final   Parainfluenza Virus 2 NOT DETECTED NOT DETECTED Final   Parainfluenza Virus 3 NOT DETECTED NOT DETECTED Final   Parainfluenza Virus 4 NOT DETECTED NOT DETECTED Final   Respiratory Syncytial Virus NOT DETECTED NOT DETECTED Final   Bordetella  pertussis NOT DETECTED NOT DETECTED Final   Bordetella Parapertussis NOT DETECTED NOT DETECTED Final   Chlamydophila pneumoniae NOT DETECTED NOT DETECTED Final   Mycoplasma pneumoniae NOT DETECTED NOT DETECTED Final    Comment: Performed at [facility], [place] [address] [place], [place] [address]    Radiology Studies: DG Chest Port 1 View  Result Date: 04/12/2022 CLINICAL DATA:  Shortness of breath EXAM: PORTABLE CHEST 1 VIEW COMPARISON:  04/05/2022 FINDINGS: Bilateral diffuse interstitial thickening. No focal consolidation. No pleural effusion or pneumothorax. Stable cardiomegaly. Prior CABG. No acute osseous abnormality. IMPRESSION: 1. Mild CHF. Electronically Signed   By: Kathreen Devoid M.D.   On: 04/12/2022 08:52    Scheduled Meds:  amLODipine  5 mg Oral Daily   arformoterol  15 mcg Nebulization BID   And   umeclidinium bromide  1 puff Inhalation Daily   buPROPion  150 mg Oral BID   carbidopa-levodopa  0.5 tablet Oral QID   entacapone  200 mg Oral TID   famotidine  20 mg Oral BID   feeding supplement (NEPRO CARB STEADY)  237 mL Oral TID BM   furosemide  40 mg Oral Daily   levothyroxine  50 mcg Oral Q0600   metoprolol tartrate  25 mg Oral BID   rosuvastatin  10 mg Oral Daily   sertraline  50 mg Oral Daily   sodium chloride flush  3 mL Intravenous Q12H   Continuous Infusions:  cefTRIAXone (ROCEPHIN)  IV       LOS: 8 days    Time spent: 35 mins    Kimbella Heisler, MD [facility]   If 7PM-7AM, please contact night-coverage Creatinine is stable.

## 2022-04-13 DIAGNOSIS — J449 Chronic obstructive pulmonary disease, unspecified: Secondary | ICD-10-CM

## 2022-04-13 DIAGNOSIS — G4733 Obstructive sleep apnea (adult) (pediatric): Secondary | ICD-10-CM

## 2022-04-13 DIAGNOSIS — I25119 Atherosclerotic heart disease of native coronary artery with unspecified angina pectoris: Secondary | ICD-10-CM

## 2022-04-13 DIAGNOSIS — D696 Thrombocytopenia, unspecified: Secondary | ICD-10-CM

## 2022-04-13 DIAGNOSIS — I5032 Chronic diastolic (congestive) heart failure: Secondary | ICD-10-CM

## 2022-04-13 DIAGNOSIS — F0280 Dementia in other diseases classified elsewhere without behavioral disturbance: Secondary | ICD-10-CM

## 2022-04-13 DIAGNOSIS — S065XAA Traumatic subdural hemorrhage with loss of consciousness status unknown, initial encounter: Secondary | ICD-10-CM | POA: Diagnosis not present

## 2022-04-13 DIAGNOSIS — G9341 Metabolic encephalopathy: Secondary | ICD-10-CM

## 2022-04-13 DIAGNOSIS — R4781 Slurred speech: Secondary | ICD-10-CM | POA: Diagnosis not present

## 2022-04-13 DIAGNOSIS — R296 Repeated falls: Secondary | ICD-10-CM

## 2022-04-13 DIAGNOSIS — N3000 Acute cystitis without hematuria: Secondary | ICD-10-CM

## 2022-04-13 DIAGNOSIS — F05 Delirium due to known physiological condition: Secondary | ICD-10-CM

## 2022-04-13 DIAGNOSIS — E039 Hypothyroidism, unspecified: Secondary | ICD-10-CM

## 2022-04-13 DIAGNOSIS — G20A1 Parkinson's disease without dyskinesia, without mention of fluctuations: Secondary | ICD-10-CM | POA: Diagnosis not present

## 2022-04-13 DIAGNOSIS — I10 Essential (primary) hypertension: Secondary | ICD-10-CM

## 2022-04-13 DIAGNOSIS — I160 Hypertensive urgency: Secondary | ICD-10-CM

## 2022-04-13 LAB — BASIC METABOLIC PANEL
Anion gap: 10 (ref 5–15)
BUN: 51 mg/dL — ABNORMAL HIGH (ref 8–23)
CO2: 24 mmol/L (ref 22–32)
Calcium: 9.3 mg/dL (ref 8.9–10.3)
Chloride: 107 mmol/L (ref 98–111)
Creatinine, Ser: 1.25 mg/dL — ABNORMAL HIGH (ref 0.61–1.24)
GFR, Estimated: 57 mL/min — ABNORMAL LOW (ref >60)
Glucose, Bld: 185 mg/dL — ABNORMAL HIGH (ref 70–99)
Potassium: 3.9 mmol/L (ref 3.5–5.1)
Sodium: 141 mmol/L (ref 135–145)

## 2022-04-13 LAB — CBC
HCT: 49.8 % (ref 39.0–52.0)
Hemoglobin: 16.1 g/dL (ref 13.0–17.0)
MCH: 29.6 pg (ref 26.0–34.0)
MCHC: 32.3 g/dL (ref 30.0–36.0)
MCV: 91.5 fL (ref 80.0–100.0)
Platelets: 217 10*3/uL (ref 150–400)
RBC: 5.44 MIL/uL (ref 4.22–5.81)
RDW: 14.2 % (ref 11.5–15.5)
WBC: 25.3 10*3/uL — ABNORMAL HIGH (ref 4.0–10.5)
nRBC: 0 % (ref 0.0–0.2)

## 2022-04-13 LAB — MAGNESIUM: Magnesium: 2.7 mg/dL — ABNORMAL HIGH (ref 1.7–2.4)

## 2022-04-13 LAB — GLUCOSE, CAPILLARY: Glucose-Capillary: 188 mg/dL — ABNORMAL HIGH (ref 70–99)

## 2022-04-13 LAB — AMMONIA: Ammonia: 26 umol/L (ref 9–35)

## 2022-04-13 LAB — TSH: TSH: 2.976 u[IU]/mL (ref 0.350–4.500)

## 2022-04-13 LAB — PHOSPHORUS: Phosphorus: 3.4 mg/dL (ref 2.5–4.6)

## 2022-04-13 MED ORDER — POLYETHYLENE GLYCOL 3350 17 G PO PACK: 17.0000 g | PACK | Freq: Two times a day (BID) | ORAL | Status: DC | PRN

## 2022-04-13 MED ORDER — LACTATED RINGERS IV BOLUS
500.0000 mL | Freq: Three times a day (TID) | INTRAVENOUS | Status: AC
  Administered 2022-04-13 (×2): 500 mL via INTRAVENOUS

## 2022-04-13 MED ORDER — SENNOSIDES-DOCUSATE SODIUM 8.6-50 MG PO TABS
1.0000 | ORAL_TABLET | Freq: Two times a day (BID) | ORAL | Status: DC
  Administered 2022-04-13 – 2022-04-19 (×13): 1 via ORAL
  Filled 2022-04-13 (×14): qty 1

## 2022-04-13 NOTE — TOC Progression Note (Addendum)
Transition of Care [facility]) - Progression Note    Patient Details  Name: Andre [name] MRN: [id number] Date of Birth: Oct 11, 1935  [facility] CM/SW Contact  Gerilyn Pilgrim, LCSW Phone Number: 04/13/2022, 11:01 AM  Clinical Narrative:     SW informed wife [facility] would be coming to assess pt around 2 or 3 pm. She stated she would also like referrals sent in [place] as pt has not had any acceptances as of yet, CSW will continue to follow.   3:40pm No one from [facility] has come to assess patient. SW left message for [employer] [phone].   Expected Discharge Plan: [employer] Barriers to Discharge: Continued Medical Work up  Expected Discharge Plan and Services   Discharge Planning Services: CM Consult Post Acute Care Choice: [place] Living arrangements for the past 2 months: Single Family Home                                       Social Determinants of Health (SDOH) Interventions SDOH Screenings   Food Insecurity: No Food Insecurity (04/06/2022)  Housing: Low Risk  (04/06/2022)  Transportation Needs: No Transportation Needs (04/06/2022)  Utilities: Not At Risk (04/06/2022)  Depression (PHQ2-9): Low Risk  (01/18/2022)  Financial Resource Strain: Low Risk  (09/14/2021)  Physical Activity: Unknown (12/29/2017)  Social Connections: Unknown (09/14/2021)  Stress: No Stress Concern Present (09/14/2021)  Tobacco Use: Medium Risk (04/02/2022)    Readmission Risk Interventions     No data to display

## 2022-04-13 NOTE — Progress Notes (Signed)
PROGRESS NOTE    Andre [name]  WOE:[id number] DOB: 1935/12/09 DOA: 04/08/2022 PCP: [name], MD    Brief Narrative:  86 year old M with PMH of dementia, CAD s/p CABG, paroxysmal A-fib on Eliquis, Parkinson's dementia, OSA on CPAP, HFpEF and frequent falls who was brought to the ED with concerns for increased confusion and agitation that has been ongoing but worse over the past week.  Wife reports patient has been falling and last fell 2 days ago,  hitting his head.  CT head showed an acute subdural hemorrhage with minimal left-to-right midline shift.  Patient was evaluated by neurosurgery Dr. Cari Caraway who is stated patient is not a surgical candidate given his dementia and frailty.  Patient was given Kcentra for Eliquis reversal.  Patient is started on Cardene drip for hypertension.  Repeat CT head shows stable subdural hematoma.  Patient remains confused and delirious.  Also concerned about UTI.  Did on antibiotics.  Urine culture pending.  Assessment & Plan:   Principal Problem:   Subdural hematoma (HCC) Active Problems:   OSA on CPAP   PAF (paroxysmal atrial fibrillation) (HCC)   Thrombocytopenia (HCC)   Parkinson's disease   Coronary artery disease involving native coronary artery of native heart with angina pectoris (HCC)   Essential hypertension   Hypothyroidism   COPD (chronic obstructive pulmonary disease) (HCC)   Slurred speech   Delirium due to multiple etiologies, acute, hyperactive   Dementia associated with Parkinson's disease ([place])   Frequent falls   Chronic anticoagulation   Hypertensive urgency   Chronic diastolic CHF (congestive heart failure) (HCC)   SDH (subdural hematoma) (HCC)  Delirium/acute metabolic encephalopathy/slurred speech in patient with Parkinson's dementia: patient is totally disoriented.no focal neurosymptoms but Limited neuroexam due to  patient's mental status.  CT head without acute finding.  He has significant leukocytosis and fever.   UA concerning for UTI.  Full RVP, COVID-19, influenza and RSV PCR nonreactive.  CXR without acute finding.  No meningeal signs. -Minimize benzodiazepines.  Not able to use antipsychotics due to history of Parkinson's. -Delirium, fall and aspiration precautions -Continue Sinemet and Comtan. -Basic encephalopathy labs including TSH, B12, ammonia and VBG -Continue IV ceftriaxone for possible UTI pending urine culture  Subdural hematoma in the setting of chronic anticoagulation and recurrent falls.  Received Kcentra for Eliquis reversal.  Eliquis discontinued.  Repeat CT head with stable SDH.  Neurosurgery consulted and did not feel patient is a surgical candidate for resection of subdural hematoma given his underlying dementia, I recommended conservative care. -Continue PT/OT  Dysphagia -Continue dysphagia 1 diet per SLP. -Aspiration precaution   Chronic Diastolic CHF: Appears euvolemic.  At risk for dehydration due to dementia and confusion. -Hold home Lasix and losartan. -Monitor respiratory and fluid status while on IV fluids -Continue metoprolol.   Hypertensive urgency: BP within acceptable range. -Cardiac meds as above   Chronic COPD: Stable. -Continue Stiolto Respimat with as needed albuterol.   History of CAD: Troponin negative.  EKG without acute ischemic finding. -Continue metoprolol.  Continue rosuvastatin,  Eliquis discontinued.    Obstructive sleep apnea on CPAP: -Continue CPAP nightly if able to tolerate.   DVT prophylaxis: SCDs Code Status: DNR Family Communication: Updated patient's wife over the phone Disposition Plan:   Status is: Inpatient Remains inpatient appropriate because: Delirium/acute metabolic encephalopathy and possible UTI  Final disposition: SNF.   Consultants:  Neurosurgery-signed off   Subjective: Seen and examined earlier this morning.  Patient remains confused and disoriented.  Not  able to provide history.  Does not follow command.  Does  not appear to be in distress.  No facial asymmetry or focal neurodeficit.  Objective: Vitals:   04/13/22 0301 04/13/22 0700 04/13/22 0837 04/13/22 1144  BP: 99/86 (!) 145/71 (!) 141/66 (!) 136/58  Pulse: 67 71 79 71  Resp: (!) '22 20  18  '$ Temp: 97.7 F (36.5 C) (!) 101.1 F (38.4 C)  98.4 F (36.9 C)  TempSrc: Oral Axillary  Axillary  SpO2: 96% 96%  99%  Weight:      Height:        Intake/Output Summary (Last 24 hours) at 04/13/2022 1338 Last data filed at 04/13/2022 1211 Gross per 24 hour  Intake 829 ml  Output 1100 ml  Net -271 ml   Filed Weights   04/03/2022 1932 04/10/22 0500  Weight: 94 kg 94.3 kg    Examination: GENERAL: No apparent distress.  Nontoxic. HEENT: MMM.  Vision and hearing grossly intact.  NECK: Supple.  No apparent JVD.  RESP:  No IWOB.  Fair aeration bilaterally. CVS:  RRR. Heart sounds normal.  ABD/GI/GU: BS+. Abd soft, NTND.  MSK/EXT:  Moves extremities. No apparent deformity. No edema.  SKIN: no apparent skin lesion or wound NEURO: Awake but not alert.  Oriented x 0.  Does not follow command.  No apparent focal neuro deficit but limited exam due to mental status. PSYCH: Calm.  No distress or agitation.    Data Reviewed: I have personally reviewed following labs and imaging studies  CBC: Recent Labs  Lab 04/07/22 0340 04/08/22 0605 04/12/22 0454 04/13/22 0629  WBC 14.6* 12.5* 30.7* 25.3*  HGB 16.1 15.0 16.3 16.1  HCT 49.4 45.5 50.8 49.8  MCV 91.7 90.1 92.4 91.5  PLT 155 155 202 993   Basic Metabolic Panel: Recent Labs  Lab 04/08/22 0605 04/09/22 0432 04/12/22 0454 04/13/22 0629  NA 139  --  137 141  K 3.1* 4.2 4.3 3.9  CL 104  --  102 107  CO2 24  --  26 24  GLUCOSE 116*  --  178* 185*  BUN 37*  --  41* 51*  CREATININE 0.83  --  1.19 1.25*  CALCIUM 8.8*  --  9.2 9.3  MG 2.1  --  2.4 2.7*  PHOS 3.5  --  3.7 3.4   GFR: Estimated Creatinine Clearance: 48.2 mL/min (A) (by C-G formula based on SCr of 1.25 mg/dL (H)). Liver  Function Tests: No results for input(s): "AST", "ALT", "ALKPHOS", "BILITOT", "PROT", "ALBUMIN" in the last 168 hours. No results for input(s): "LIPASE", "AMYLASE" in the last 168 hours. No results for input(s): "AMMONIA" in the last 168 hours. Coagulation Profile: No results for input(s): "INR", "PROTIME" in the last 168 hours.  Cardiac Enzymes: No results for input(s): "CKTOTAL", "CKMB", "CKMBINDEX", "TROPONINI" in the last 168 hours. BNP (last 3 results) No results for input(s): "PROBNP" in the last 8760 hours. HbA1C: No results for input(s): "HGBA1C" in the last 72 hours. CBG: Recent Labs  Lab 04/07/22 0457 04/07/22 1957 04/08/22 0754 04/12/22 0358 04/13/22 0757  GLUCAP 117* 137* 122* 200* 188*   Lipid Profile: No results for input(s): "CHOL", "HDL", "LDLCALC", "TRIG", "CHOLHDL", "LDLDIRECT" in the last 72 hours. Thyroid Function Tests: No results for input(s): "TSH", "T4TOTAL", "FREET4", "T3FREE", "THYROIDAB" in the last 72 hours. Anemia Panel: No results for input(s): "VITAMINB12", "FOLATE", "FERRITIN", "TIBC", "IRON", "RETICCTPCT" in the last 72 hours. Sepsis Labs: No results for input(s): "PROCALCITON", "LATICACIDVEN" in the last 168  hours.  Recent Results (from the past 240 hour(s))  Resp panel by RT-PCR (RSV, Flu A&B, Covid) Anterior Nasal Swab     Status: None   Collection Time: 04/05/22  2:07 PM   Specimen: Anterior Nasal Swab  Result Value Ref Range Status   SARS Coronavirus 2 by RT PCR NEGATIVE NEGATIVE Final    Comment: (NOTE) SARS-CoV-2 target nucleic acids are NOT DETECTED.  The SARS-CoV-2 RNA is generally detectable in upper respiratory specimens during the acute phase of infection. The lowest concentration of SARS-CoV-2 viral copies this assay can detect is 138 copies/mL. A negative result does not preclude SARS-Cov-2 infection and should not be used as the sole basis for treatment or other patient management decisions. A negative result may occur with   improper specimen collection/handling, submission of specimen other than nasopharyngeal swab, presence of viral mutation(s) within the areas targeted by this assay, and inadequate number of viral copies(<138 copies/mL). A negative result must be combined with clinical observations, patient history, and epidemiological information. The expected result is Negative.  Fact Sheet for Patients:  EntrepreneurPulse.com.au  Fact Sheet for Healthcare Providers:  IncredibleEmployment.be  This test is no t yet approved or cleared by the [place] FDA and  has been authorized for detection and/or diagnosis of SARS-CoV-2 by FDA under an Emergency Use Authorization (EUA). This EUA will remain  in effect (meaning this test can be used) for the duration of the COVID-19 declaration under Section 564(b)(1) of the Act, 21 U.S.C.section 360bbb-3(b)(1), unless the authorization is terminated  or revoked sooner.       Influenza A by PCR NEGATIVE NEGATIVE Final   Influenza B by PCR NEGATIVE NEGATIVE Final    Comment: (NOTE) The Xpert Xpress SARS-CoV-2/FLU/RSV plus assay is intended as an aid in the diagnosis of influenza from Nasopharyngeal swab specimens and should not be used as a sole basis for treatment. Nasal washings and aspirates are unacceptable for Xpert Xpress SARS-CoV-2/FLU/RSV testing.  Fact Sheet for Patients: EntrepreneurPulse.com.au  Fact Sheet for Healthcare Providers: IncredibleEmployment.be  This test is not yet approved or cleared by the [place] FDA and has been authorized for detection and/or diagnosis of SARS-CoV-2 by FDA under an Emergency Use Authorization (EUA). This EUA will remain in effect (meaning this test can be used) for the duration of the COVID-19 declaration under Section 564(b)(1) of the Act, 21 U.S.C. section 360bbb-3(b)(1), unless the authorization is terminated or revoked.      Resp Syncytial Virus by PCR NEGATIVE NEGATIVE Final    Comment: (NOTE) Fact Sheet for Patients: EntrepreneurPulse.com.au  Fact Sheet for Healthcare Providers: IncredibleEmployment.be  This test is not yet approved or cleared by the [place] FDA and has been authorized for detection and/or diagnosis of SARS-CoV-2 by FDA under an Emergency Use Authorization (EUA). This EUA will remain in effect (meaning this test can be used) for the duration of the COVID-19 declaration under Section 564(b)(1) of the Act, 21 U.S.C. section 360bbb-3(b)(1), unless the authorization is terminated or revoked.  Performed at [facility], [place], [place] [address]   Respiratory (~20 pathogens) panel by PCR     Status: None   Collection Time: 04/05/22  2:07 PM   Specimen: Nasopharyngeal Swab; Respiratory  Result Value Ref Range Status   Adenovirus NOT DETECTED NOT DETECTED Final   Coronavirus 229E NOT DETECTED NOT DETECTED Final    Comment: (NOTE) The Coronavirus on the Respiratory Panel, DOES NOT test for the novel  Coronavirus (2019 nCoV)  Coronavirus HKU1 NOT DETECTED NOT DETECTED Final   Coronavirus NL63 NOT DETECTED NOT DETECTED Final   Coronavirus OC43 NOT DETECTED NOT DETECTED Final   Metapneumovirus NOT DETECTED NOT DETECTED Final   Rhinovirus / Enterovirus NOT DETECTED NOT DETECTED Final   Influenza A NOT DETECTED NOT DETECTED Final   Influenza B NOT DETECTED NOT DETECTED Final   Parainfluenza Virus 1 NOT DETECTED NOT DETECTED Final   Parainfluenza Virus 2 NOT DETECTED NOT DETECTED Final   Parainfluenza Virus 3 NOT DETECTED NOT DETECTED Final   Parainfluenza Virus 4 NOT DETECTED NOT DETECTED Final   Respiratory Syncytial Virus NOT DETECTED NOT DETECTED Final   Bordetella pertussis NOT DETECTED NOT DETECTED Final   Bordetella Parapertussis NOT DETECTED NOT DETECTED Final   Chlamydophila pneumoniae NOT DETECTED NOT  DETECTED Final   Mycoplasma pneumoniae NOT DETECTED NOT DETECTED Final    Comment: Performed at [facility], [place] [address], [place], [place] [address]    Radiology Studies: DG Chest Port 1 View  Result Date: 04/12/2022 CLINICAL DATA:  Shortness of breath EXAM: PORTABLE CHEST 1 VIEW COMPARISON:  04/05/2022 FINDINGS: Bilateral diffuse interstitial thickening. No focal consolidation. No pleural effusion or pneumothorax. Stable cardiomegaly. Prior CABG. No acute osseous abnormality. IMPRESSION: 1. Mild CHF. Electronically Signed   By: Kathreen Devoid M.D.   On: 04/12/2022 08:52    Scheduled Meds:  amLODipine  5 mg Oral Daily   buPROPion  150 mg Oral BID   carbidopa-levodopa  0.5 tablet Oral QID   entacapone  200 mg Oral TID   famotidine  20 mg Oral BID   feeding supplement (NEPRO CARB STEADY)  237 mL Oral TID BM   furosemide  40 mg Oral Daily   levothyroxine  50 mcg Oral Q0600   metoprolol tartrate  25 mg Oral BID   rosuvastatin  10 mg Oral Daily   senna-docusate  1 tablet Oral BID   sertraline  50 mg Oral Daily   sodium chloride flush  3 mL Intravenous Q12H   umeclidinium bromide  1 puff Inhalation Daily   Continuous Infusions:  cefTRIAXone (ROCEPHIN)  IV 1 g (04/13/22 1229)   lactated ringers 500 mL (04/13/22 0913)     LOS: 9 days     Mercy Riding, MD [facility]   If 7PM-7AM, please contact night-coverage Creatinine is stable.

## 2022-04-13 NOTE — TOC Progression Note (Signed)
Transition of Care [facility]) - Progression Note    Patient Details  Name: Andre [name] MRN: [id number] Date of Birth: 24-May-1935  Transition of Care [facility]) CM/SW Contact  Gerilyn Pilgrim, LCSW Phone Number: 04/13/2022, 8:17 AM  Clinical Narrative:   SW reached out to [facility] [facility] spoke with [place], [phone]. Whitney states pt would need an in person assessment. Whitney states that their boss,Shawn will likely come by today around 2 or 3 to do the admission assessment for patient.     Expected Discharge Plan: [place] Barriers to Discharge: Continued Medical Work up  Expected Discharge Plan and Services   Discharge Planning Services: CM Consult Post Acute Care Choice: [facility] arrangements for the past 2 months: Single Family Home                                       Social Determinants of Health (SDOH) Interventions SDOH Screenings   Food Insecurity: No Food Insecurity (04/06/2022)  Housing: Low Risk  (04/06/2022)  Transportation Needs: No Transportation Needs (04/06/2022)  Utilities: Not At Risk (04/06/2022)  Depression (PHQ2-9): Low Risk  (01/18/2022)  Financial Resource Strain: Low Risk  (09/14/2021)  Physical Activity: Unknown (12/29/2017)  Social Connections: Unknown (09/14/2021)  Stress: No Stress Concern Present (09/14/2021)  Tobacco Use: Medium Risk (03/22/2022)    Readmission Risk Interventions     No data to display

## 2022-04-14 DIAGNOSIS — D696 Thrombocytopenia, unspecified: Secondary | ICD-10-CM | POA: Diagnosis not present

## 2022-04-14 DIAGNOSIS — S065XAA Traumatic subdural hemorrhage with loss of consciousness status unknown, initial encounter: Secondary | ICD-10-CM | POA: Diagnosis not present

## 2022-04-14 DIAGNOSIS — R4781 Slurred speech: Secondary | ICD-10-CM | POA: Diagnosis not present

## 2022-04-14 DIAGNOSIS — G20A1 Parkinson's disease without dyskinesia, without mention of fluctuations: Secondary | ICD-10-CM | POA: Diagnosis not present

## 2022-04-14 LAB — COMPREHENSIVE METABOLIC PANEL
ALT: 33 U/L (ref 0–44)
AST: 74 U/L — ABNORMAL HIGH (ref 15–41)
Albumin: 2.6 g/dL — ABNORMAL LOW (ref 3.5–5.0)
Alkaline Phosphatase: 148 U/L — ABNORMAL HIGH (ref 38–126)
Anion gap: 9 (ref 5–15)
BUN: 48 mg/dL — ABNORMAL HIGH (ref 8–23)
CO2: 24 mmol/L (ref 22–32)
Calcium: 9.2 mg/dL (ref 8.9–10.3)
Chloride: 110 mmol/L (ref 98–111)
Creatinine, Ser: 1.18 mg/dL (ref 0.61–1.24)
GFR, Estimated: >60 mL/min (ref >60)
Glucose, Bld: 234 mg/dL — ABNORMAL HIGH (ref 70–99)
Potassium: 3.4 mmol/L — ABNORMAL LOW (ref 3.5–5.1)
Sodium: 143 mmol/L (ref 135–145)
Total Bilirubin: 0.8 mg/dL (ref 0.3–1.2)
Total Protein: 6.6 g/dL (ref 6.5–8.1)

## 2022-04-14 LAB — BLOOD GAS, VENOUS
Acid-Base Excess: 5.9 mmol/L — ABNORMAL HIGH (ref 0.0–2.0)
Bicarbonate: 28 mmol/L (ref 20.0–28.0)
O2 Saturation: 95.6 %
Patient temperature: 37
pCO2, Ven: 32 mmHg — ABNORMAL LOW (ref 44–60)
pH, Ven: 7.55 — ABNORMAL HIGH (ref 7.25–7.43)
pO2, Ven: 73 mmHg — ABNORMAL HIGH (ref 32–45)

## 2022-04-14 LAB — MAGNESIUM: Magnesium: 2.5 mg/dL — ABNORMAL HIGH (ref 1.7–2.4)

## 2022-04-14 LAB — CBC
HCT: 46.9 % (ref 39.0–52.0)
Hemoglobin: 14.8 g/dL (ref 13.0–17.0)
MCH: 29.4 pg (ref 26.0–34.0)
MCHC: 31.6 g/dL (ref 30.0–36.0)
MCV: 93.2 fL (ref 80.0–100.0)
Platelets: 235 10*3/uL (ref 150–400)
RBC: 5.03 MIL/uL (ref 4.22–5.81)
RDW: 14.2 % (ref 11.5–15.5)
WBC: 21 10*3/uL — ABNORMAL HIGH (ref 4.0–10.5)
nRBC: 0 % (ref 0.0–0.2)

## 2022-04-14 LAB — GLUCOSE, CAPILLARY
Glucose-Capillary: 215 mg/dL — ABNORMAL HIGH (ref 70–99)
Glucose-Capillary: 184 mg/dL — ABNORMAL HIGH (ref 70–99)
Glucose-Capillary: 137 mg/dL — ABNORMAL HIGH (ref 70–99)

## 2022-04-14 LAB — PHOSPHORUS: Phosphorus: 2.9 mg/dL (ref 2.5–4.6)

## 2022-04-14 LAB — RPR: RPR Ser Ql: NONREACTIVE

## 2022-04-14 LAB — CK: Total CK: 31 U/L — ABNORMAL LOW (ref 49–397)

## 2022-04-14 LAB — VITAMIN B12: Vitamin B-12: 206 pg/mL (ref 180–914)

## 2022-04-14 MED ORDER — POLYETHYLENE GLYCOL 3350 17 G PO PACK
17.0000 g | PACK | Freq: Every day | ORAL | Status: DC
  Administered 2022-04-14 – 2022-04-18 (×4): 17 g via ORAL
  Filled 2022-04-14 (×4): qty 1

## 2022-04-14 MED ORDER — POTASSIUM CHLORIDE 20 MEQ/15ML (10%) PO SOLN
40.0000 meq | Freq: Once | ORAL | Status: AC
  Administered 2022-04-14: 40 meq via ORAL
  Filled 2022-04-14: qty 30

## 2022-04-14 MED ORDER — LACTATED RINGERS IV BOLUS
500.0000 mL | Freq: Three times a day (TID) | INTRAVENOUS | Status: AC
  Administered 2022-04-14 (×3): 500 mL via INTRAVENOUS

## 2022-04-14 MED ORDER — INSULIN ASPART 100 UNIT/ML IJ SOLN
0.0000 [IU] | Freq: Three times a day (TID) | INTRAMUSCULAR | Status: DC
  Administered 2022-04-14 – 2022-04-15 (×3): 1 [IU] via SUBCUTANEOUS
  Administered 2022-04-16: 2 [IU] via SUBCUTANEOUS
  Administered 2022-04-16 – 2022-04-17 (×3): 1 [IU] via SUBCUTANEOUS
  Administered 2022-04-17: 2 [IU] via SUBCUTANEOUS
  Administered 2022-04-17: 1 [IU] via SUBCUTANEOUS
  Administered 2022-04-18 (×2): 2 [IU] via SUBCUTANEOUS
  Administered 2022-04-19: 3 [IU] via SUBCUTANEOUS
  Administered 2022-04-19 – 2022-04-20 (×2): 1 [IU] via SUBCUTANEOUS
  Administered 2022-04-20 (×2): 2 [IU] via SUBCUTANEOUS
  Filled 2022-04-14 (×12): qty 1

## 2022-04-14 MED ORDER — INSULIN ASPART 100 UNIT/ML IJ SOLN: 0.0000 [IU] | Freq: Every day | INTRAMUSCULAR | Status: DC

## 2022-04-14 NOTE — Progress Notes (Signed)
PROGRESS NOTE    Andre [name]  RSW:[id number] DOB: 1936/03/20 DOA: 04/17/2022 PCP: [name], MD    Brief Narrative:  86 year old M with PMH of dementia, CAD s/p CABG, paroxysmal A-fib on Eliquis, Parkinson's dementia, OSA on CPAP, HFpEF and frequent falls who was brought to the ED with concerns for increased confusion and agitation that has been ongoing but worse over the past week.  Wife reports patient has been falling and last fell 2 days ago,  hitting his head.  CT head showed an acute subdural hemorrhage with minimal left-to-right midline shift.  Patient was evaluated by neurosurgery Dr. Cari Caraway who is stated patient is not a surgical candidate given his dementia and frailty.  Patient was given Kcentra for Eliquis reversal.  Patient is started on Cardene drip for hypertension.  Repeat CT head shows stable subdural hematoma.  Patient remains confused and delirious.  Also concerned about UTI.  Did on antibiotics.  Urine culture ordered on 12/26 but not collected.  Repeat urine culture ordered on 12/28.  Patient remains encephalopathic and delirious.  Palliative medicine consulted.  Assessment & Plan:   Principal Problem:   Subdural hematoma (HCC) Active Problems:   OSA on CPAP   PAF (paroxysmal atrial fibrillation) (HCC)   Thrombocytopenia (HCC)   Parkinson's disease   Coronary artery disease involving native coronary artery of native heart with angina pectoris (HCC)   Essential hypertension   Hypothyroidism   COPD (chronic obstructive pulmonary disease) (HCC)   Slurred speech   Delirium due to multiple etiologies, acute, hyperactive   Dementia associated with Parkinson's disease ([place])   Frequent falls   Chronic anticoagulation   Hypertensive urgency   Chronic diastolic CHF (congestive heart failure) (HCC)   SDH (subdural hematoma) (HCC)  Delirium/acute metabolic encephalopathy/slurred speech in patient with Parkinson's dementia: patient is totally disoriented.no  focal neurosymptoms but Limited neuroexam due to  patient's mental status.  He has significant leukocytosis and fever.  UA concerning for UTI.  Full RVP, COVID-19, influenza and RSV PCR nonreactive.  CT head, TSH, B12, ammonia and VBG unrevealing.  CXR without acute finding.  No meningeal signs. -Minimize benzodiazepines.  Not able to use antipsychotics due to history of Parkinson's. -Delirium, fall and aspiration precautions -Continue Sinemet and Comtan. -Continue IV ceftriaxone for possible UTI pending urine culture.  Ordered another urine culture today. -LR bolus 500 cc 3 times daily x 3 doses. -Monitor urine output  -Scheduled bowel regimen possible constipation -Palliative medicine consulted  Subdural hematoma in the setting of chronic anticoagulation and recurrent falls.  Received Kcentra for Eliquis reversal.  Eliquis discontinued.  Repeat CT head with stable SDH.  Neurosurgery consulted and did not feel patient is a surgical candidate for resection of subdural hematoma given his underlying dementia, I recommended conservative care. -Continue PT/OT  Dysphagia -Continue dysphagia 1 diet per SLP. -Aspiration precaution   Chronic Diastolic CHF: Appears euvolemic.  At risk for dehydration due to dementia and confusion. -Closely monitor respiratory and fluid status while on IV fluid boluses -Continue holding home Lasix and losartan. -Monitor respiratory and fluid status while on IV fluids -Continue metoprolol.   Hypertensive urgency: BP within acceptable range. -Cardiac meds as above   Chronic COPD: Stable. -Continue Stiolto Respimat with as needed albuterol.   History of CAD: Troponin negative.  EKG without acute ischemic finding. -Continue metoprolol.  Continue rosuvastatin,  Eliquis discontinued.    Obstructive sleep apnea on CPAP: -Continue CPAP nightly if able to tolerate.   DVT prophylaxis:  SCDs Code Status: DNR Family Communication: Updated patient's wife over the phone  on 12/27. Disposition Plan:   Status is: Inpatient Remains inpatient appropriate because: Delirium/acute metabolic encephalopathy and possible UTI  Final disposition: SNF.   Consultants:  Neurosurgery-signed off Palliative medicine   Subjective: Seen and examined earlier this morning.  Patient remains confused and disoriented.  Not able to provide history.  Does not follow command.  Does not appear to be in distress.  No facial asymmetry or focal neurodeficit.  Objective: Vitals:   04/13/22 2305 04/14/22 0347 04/14/22 0744 04/14/22 1153  BP: (!) 131/56 129/78 120/76 109/71  Pulse: 73 79 71 76  Resp: '20  18 19  '$ Temp: 98.6 F (37 C) 99.5 F (37.5 C) 98.5 F (36.9 C) 99 F (37.2 C)  TempSrc: Oral Oral Oral Axillary  SpO2: 93% 94% 92% 93%  Weight:   94 kg   Height:        Intake/Output Summary (Last 24 hours) at 04/14/2022 1501 Last data filed at 04/14/2022 1423 Gross per 24 hour  Intake 598 ml  Output 1200 ml  Net -602 ml   Filed Weights   04/15/2022 1932 04/10/22 0500 04/14/22 0744  Weight: 94 kg 94.3 kg 94 kg    Examination: GENERAL: No apparent distress.  Nontoxic. HEENT: MMM.  Vision and hearing grossly intact.  NECK: Supple.  No apparent JVD.  RESP:  No IWOB.  Fair aeration bilaterally. CVS:  RRR. Heart sounds normal.  ABD/GI/GU: BS+. Abd soft, NTND.  MSK/EXT:  Moves extremities. No apparent deformity. No edema.  SKIN: no apparent skin lesion or wound NEURO: Awake but not alert.  Oriented x 0.  Does not follow command.  No apparent focal neuro deficit but limited exam due to mental status. PSYCH: Calm.  No distress or agitation.    Data Reviewed: I have personally reviewed following labs and imaging studies  CBC: Recent Labs  Lab 04/08/22 0605 04/12/22 0454 04/13/22 0629 04/14/22 0400  WBC 12.5* 30.7* 25.3* 21.0*  HGB 15.0 16.3 16.1 14.8  HCT 45.5 50.8 49.8 46.9  MCV 90.1 92.4 91.5 93.2  PLT 155 202 217 357   Basic Metabolic Panel: Recent  Labs  Lab 04/08/22 0605 04/09/22 0432 04/12/22 0454 04/13/22 0629 04/14/22 0400  NA 139  --  137 141 143  K 3.1* 4.2 4.3 3.9 3.4*  CL 104  --  102 107 110  CO2 24  --  '26 24 24  '$ GLUCOSE 116*  --  178* 185* 234*  BUN 37*  --  41* 51* 48*  CREATININE 0.83  --  1.19 1.25* 1.18  CALCIUM 8.8*  --  9.2 9.3 9.2  MG 2.1  --  2.4 2.7* 2.5*  PHOS 3.5  --  3.7 3.4 2.9   GFR: Estimated Creatinine Clearance: 51 mL/min (by C-G formula based on SCr of 1.18 mg/dL). Liver Function Tests: Recent Labs  Lab 04/14/22 0400  AST 74*  ALT 33  ALKPHOS 148*  BILITOT 0.8  PROT 6.6  ALBUMIN 2.6*   No results for input(s): "LIPASE", "AMYLASE" in the last 168 hours. Recent Labs  Lab 04/13/22 1416  AMMONIA 26   Coagulation Profile: No results for input(s): "INR", "PROTIME" in the last 168 hours.  Cardiac Enzymes: Recent Labs  Lab 04/14/22 0400  CKTOTAL 31*   BNP (last 3 results) No results for input(s): "PROBNP" in the last 8760 hours. HbA1C: No results for input(s): "HGBA1C" in the last 72 hours. CBG: Recent Labs  Lab 04/08/22 0754 04/12/22 0358 04/13/22 0757 04/14/22 0514 04/14/22 0759  GLUCAP 122* 200* 188* 215* 184*   Lipid Profile: No results for input(s): "CHOL", "HDL", "LDLCALC", "TRIG", "CHOLHDL", "LDLDIRECT" in the last 72 hours. Thyroid Function Tests: Recent Labs    04/13/22 1407  TSH 2.976   Anemia Panel: Recent Labs    04/13/22 1407  VITAMINB12 206   Sepsis Labs: No results for input(s): "PROCALCITON", "LATICACIDVEN" in the last 168 hours.  Recent Results (from the past 240 hour(s))  Resp panel by RT-PCR (RSV, Flu A&B, Covid) Anterior Nasal Swab     Status: None   Collection Time: 04/05/22  2:07 PM   Specimen: Anterior Nasal Swab  Result Value Ref Range Status   SARS Coronavirus 2 by RT PCR NEGATIVE NEGATIVE Final    Comment: (NOTE) SARS-CoV-2 target nucleic acids are NOT DETECTED.  The SARS-CoV-2 RNA is generally detectable in upper  respiratory specimens during the acute phase of infection. The lowest concentration of SARS-CoV-2 viral copies this assay can detect is 138 copies/mL. A negative result does not preclude SARS-Cov-2 infection and should not be used as the sole basis for treatment or other patient management decisions. A negative result may occur with  improper specimen collection/handling, submission of specimen other than nasopharyngeal swab, presence of viral mutation(s) within the areas targeted by this assay, and inadequate number of viral copies(<138 copies/mL). A negative result must be combined with clinical observations, patient history, and epidemiological information. The expected result is Negative.  Fact Sheet for Patients:  EntrepreneurPulse.com.au  Fact Sheet for Healthcare Providers:  IncredibleEmployment.be  This test is no t yet approved or cleared by the [place] FDA and  has been authorized for detection and/or diagnosis of SARS-CoV-2 by FDA under an Emergency Use Authorization (EUA). This EUA will remain  in effect (meaning this test can be used) for the duration of the COVID-19 declaration under Section 564(b)(1) of the Act, 21 U.S.C.section 360bbb-3(b)(1), unless the authorization is terminated  or revoked sooner.       Influenza A by PCR NEGATIVE NEGATIVE Final   Influenza B by PCR NEGATIVE NEGATIVE Final    Comment: (NOTE) The Xpert Xpress SARS-CoV-2/FLU/RSV plus assay is intended as an aid in the diagnosis of influenza from Nasopharyngeal swab specimens and should not be used as a sole basis for treatment. Nasal washings and aspirates are unacceptable for Xpert Xpress SARS-CoV-2/FLU/RSV testing.  Fact Sheet for Patients: EntrepreneurPulse.com.au  Fact Sheet for Healthcare Providers: IncredibleEmployment.be  This test is not yet approved or cleared by the [place] FDA and has been  authorized for detection and/or diagnosis of SARS-CoV-2 by FDA under an Emergency Use Authorization (EUA). This EUA will remain in effect (meaning this test can be used) for the duration of the COVID-19 declaration under Section 564(b)(1) of the Act, 21 U.S.C. section 360bbb-3(b)(1), unless the authorization is terminated or revoked.     Resp Syncytial Virus by PCR NEGATIVE NEGATIVE Final    Comment: (NOTE) Fact Sheet for Patients: EntrepreneurPulse.com.au  Fact Sheet for Healthcare Providers: IncredibleEmployment.be  This test is not yet approved or cleared by the [place] FDA and has been authorized for detection and/or diagnosis of SARS-CoV-2 by FDA under an Emergency Use Authorization (EUA). This EUA will remain in effect (meaning this test can be used) for the duration of the COVID-19 declaration under Section 564(b)(1) of the Act, 21 U.S.C. section 360bbb-3(b)(1), unless the authorization is terminated or revoked.  Performed at [facility], [phone]  [place], [place] [address]   Respiratory (~20 pathogens) panel by PCR     Status: None   Collection Time: 04/05/22  2:07 PM   Specimen: Nasopharyngeal Swab; Respiratory  Result Value Ref Range Status   Adenovirus NOT DETECTED NOT DETECTED Final   Coronavirus 229E NOT DETECTED NOT DETECTED Final    Comment: (NOTE) The Coronavirus on the Respiratory Panel, DOES NOT test for the novel  Coronavirus (2019 nCoV)    Coronavirus HKU1 NOT DETECTED NOT DETECTED Final   Coronavirus NL63 NOT DETECTED NOT DETECTED Final   Coronavirus OC43 NOT DETECTED NOT DETECTED Final   Metapneumovirus NOT DETECTED NOT DETECTED Final   Rhinovirus / Enterovirus NOT DETECTED NOT DETECTED Final   Influenza A NOT DETECTED NOT DETECTED Final   Influenza B NOT DETECTED NOT DETECTED Final   Parainfluenza Virus 1 NOT DETECTED NOT DETECTED Final   Parainfluenza Virus 2 NOT DETECTED NOT DETECTED Final    Parainfluenza Virus 3 NOT DETECTED NOT DETECTED Final   Parainfluenza Virus 4 NOT DETECTED NOT DETECTED Final   Respiratory Syncytial Virus NOT DETECTED NOT DETECTED Final   Bordetella pertussis NOT DETECTED NOT DETECTED Final   Bordetella Parapertussis NOT DETECTED NOT DETECTED Final   Chlamydophila pneumoniae NOT DETECTED NOT DETECTED Final   Mycoplasma pneumoniae NOT DETECTED NOT DETECTED Final    Comment: Performed at [facility], [place]. [address], [place], [place] [address]    Radiology Studies: No results found.  Scheduled Meds:  amLODipine  5 mg Oral Daily   buPROPion  150 mg Oral BID   carbidopa-levodopa  0.5 tablet Oral QID   entacapone  200 mg Oral TID   famotidine  20 mg Oral BID   feeding supplement (NEPRO CARB STEADY)  237 mL Oral TID BM   insulin aspart  0-5 Units Subcutaneous QHS   insulin aspart  0-9 Units Subcutaneous TID WC   levothyroxine  50 mcg Oral Q0600   metoprolol tartrate  25 mg Oral BID   rosuvastatin  10 mg Oral Daily   senna-docusate  1 tablet Oral BID   sertraline  50 mg Oral Daily   sodium chloride flush  3 mL Intravenous Q12H   umeclidinium bromide  1 puff Inhalation Daily   Continuous Infusions:  cefTRIAXone (ROCEPHIN)  IV 1 g (04/14/22 1217)   lactated ringers 500 mL (04/14/22 1115)     LOS: 10 days     Mercy Riding, MD [facility]   If 7PM-7AM, please contact night-coverage Creatinine is stable.

## 2022-04-14 NOTE — TOC Progression Note (Addendum)
Transition of Care [facility]) - Progression Note    Patient Details  Name: Andre [name] MRN: [id number] Date of Birth: Jan 06, 1936  [facility]) CM/SW [place], [place] Phone Number: 04/14/2022, 2:10 PM  Clinical Narrative:     Update: [facility] reports they saw patient today and they are declining due to behaviors. Patient's spouse updated.   [facility] also called to report patient is not snf appropriate will need more custodial vs geripsych.   Expected Discharge Plan: [place] Barriers to Discharge: Continued Medical Work up  Expected Discharge Plan and Services   Discharge Planning Services: CM Consult Post Acute Care Choice: [place] Living arrangements for the past 2 months: Single Family Home                                       Social Determinants of Health (SDOH) Interventions SDOH Screenings   Food Insecurity: No Food Insecurity (04/06/2022)  Housing: Low Risk  (04/06/2022)  Transportation Needs: No Transportation Needs (04/06/2022)  Utilities: Not At Risk (04/06/2022)  Depression (PHQ2-9): Low Risk  (01/18/2022)  Financial Resource Strain: Low Risk  (09/14/2021)  Physical Activity: Unknown (12/29/2017)  Social Connections: Unknown (09/14/2021)  Stress: No Stress Concern Present (09/14/2021)  Tobacco Use: Medium Risk (04/01/2022)    Readmission Risk Interventions     No data to display

## 2022-04-14 NOTE — TOC Progression Note (Addendum)
Transition of Care [facility]) - Progression Note    Patient Details  Name: Andre [name] MRN: [id number] Date of Birth: [birth year]  Transition of Care [facility]) CM/SW [place], [place] Phone Number: 04/14/2022, 10:16 AM  Clinical Narrative:     Patient/family accepted bed at [facility], insurance Josem Kaufmann has been started with [facility]   CSW has informed Tanya with [facility] of above. She reports she will be by to see patient after 11 am today.   Expected Discharge Plan: [place] Barriers to Discharge: Continued Medical Work up  Expected Discharge Plan and Services   Discharge Planning Services: CM Consult Post Acute Care Choice: [place] Living arrangements for the past 2 months: Single Family Home                                       Social Determinants of Health (SDOH) Interventions SDOH Screenings   Food Insecurity: No Food Insecurity (04/06/2022)  Housing: Low Risk  (04/06/2022)  Transportation Needs: No Transportation Needs (04/06/2022)  Utilities: Not At Risk (04/06/2022)  Depression (PHQ2-9): Low Risk  (01/18/2022)  Financial Resource Strain: Low Risk  (09/14/2021)  Physical Activity: Unknown (12/29/2017)  Social Connections: Unknown (09/14/2021)  Stress: No Stress Concern Present (09/14/2021)  Tobacco Use: Medium Risk (04/03/2022)    Readmission Risk Interventions     No data to display

## 2022-04-14 NOTE — Inpatient Diabetes Management (Signed)
Inpatient Diabetes Program Recommendations  AACE/ADA: New Consensus Statement on Inpatient Glycemic Control   Target Ranges:  Prepandial:   less than 140 mg/dL      Peak postprandial:   less than 180 mg/dL (1-2 hours)      Critically ill patients:  140 - 180 mg/dL    Latest Reference Range & Units 04/08/22 07:54 04/12/22 03:58 04/13/22 07:57 04/14/22 05:14 04/14/22 07:59  Glucose-Capillary 70 - 99 mg/dL 122 (H) 200 (H) 188 (H) 215 (H) 184 (H)    Latest Reference Range & Units 04/14/2022 19:36 04/08/22 06:05 04/12/22 04:54 04/13/22 06:29 04/14/22 04:00  Glucose 70 - 99 mg/dL 115 (H) 116 (H) 178 (H) 185 (H) 234 (H)   Review of Glycemic Control  Diabetes history: No Outpatient Diabetes medications: NA Current orders for Inpatient glycemic control: None  Inpatient Diabetes Program Recommendations:    Insulin: May want to consider ordering Novolog 0-6 units TID with meals and at bedtime.  HbgA1C: Last A1C in Epic chart is 5.7% on 04/21/20. Please consider ordering an A1C to evaluate glycemic control over the past 2-3 months.  Thanks, Barnie Alderman, RN, MSN, [place] Diabetes Coordinator Inpatient Diabetes Program [phone] (Team Pager from 8am to [place])

## 2022-04-15 ENCOUNTER — Inpatient Hospital Stay: Payer: HMO

## 2022-04-15 DIAGNOSIS — I480 Paroxysmal atrial fibrillation: Secondary | ICD-10-CM

## 2022-04-15 DIAGNOSIS — D696 Thrombocytopenia, unspecified: Secondary | ICD-10-CM | POA: Diagnosis not present

## 2022-04-15 DIAGNOSIS — G20A1 Parkinson's disease without dyskinesia, without mention of fluctuations: Secondary | ICD-10-CM | POA: Diagnosis not present

## 2022-04-15 DIAGNOSIS — R4781 Slurred speech: Secondary | ICD-10-CM | POA: Diagnosis not present

## 2022-04-15 DIAGNOSIS — Z7189 Other specified counseling: Secondary | ICD-10-CM

## 2022-04-15 DIAGNOSIS — Z66 Do not resuscitate: Secondary | ICD-10-CM

## 2022-04-15 DIAGNOSIS — S065XAA Traumatic subdural hemorrhage with loss of consciousness status unknown, initial encounter: Secondary | ICD-10-CM | POA: Diagnosis not present

## 2022-04-15 LAB — CK: Total CK: 44 U/L — ABNORMAL LOW (ref 49–397)

## 2022-04-15 LAB — RENAL FUNCTION PANEL
Albumin: 2.5 g/dL — ABNORMAL LOW (ref 3.5–5.0)
Anion gap: 4 — ABNORMAL LOW (ref 5–15)
BUN: 44 mg/dL — ABNORMAL HIGH (ref 8–23)
CO2: 25 mmol/L (ref 22–32)
Calcium: 9 mg/dL (ref 8.9–10.3)
Chloride: 120 mmol/L — ABNORMAL HIGH (ref 98–111)
Creatinine, Ser: 1.28 mg/dL — ABNORMAL HIGH (ref 0.61–1.24)
GFR, Estimated: 55 mL/min — ABNORMAL LOW (ref >60)
Glucose, Bld: 128 mg/dL — ABNORMAL HIGH (ref 70–99)
Phosphorus: 3.4 mg/dL (ref 2.5–4.6)
Potassium: 4 mmol/L (ref 3.5–5.1)
Sodium: 149 mmol/L — ABNORMAL HIGH (ref 135–145)

## 2022-04-15 LAB — CBC
HCT: 46.6 % (ref 39.0–52.0)
Hemoglobin: 14.8 g/dL (ref 13.0–17.0)
MCH: 29.6 pg (ref 26.0–34.0)
MCHC: 31.8 g/dL (ref 30.0–36.0)
MCV: 93.2 fL (ref 80.0–100.0)
Platelets: 250 10*3/uL (ref 150–400)
RBC: 5 MIL/uL (ref 4.22–5.81)
RDW: 14.4 % (ref 11.5–15.5)
WBC: 22.1 10*3/uL — ABNORMAL HIGH (ref 4.0–10.5)
nRBC: 0.1 % (ref 0.0–0.2)

## 2022-04-15 LAB — GLUCOSE, CAPILLARY
Glucose-Capillary: 120 mg/dL — ABNORMAL HIGH (ref 70–99)
Glucose-Capillary: 140 mg/dL — ABNORMAL HIGH (ref 70–99)
Glucose-Capillary: 143 mg/dL — ABNORMAL HIGH (ref 70–99)
Glucose-Capillary: 150 mg/dL — ABNORMAL HIGH (ref 70–99)

## 2022-04-15 LAB — HEMOGLOBIN A1C
Hgb A1c MFr Bld: 5.8 % — ABNORMAL HIGH (ref 4.8–5.6)
Mean Plasma Glucose: 120 mg/dL

## 2022-04-15 LAB — MAGNESIUM: Magnesium: 2.7 mg/dL — ABNORMAL HIGH (ref 1.7–2.4)

## 2022-04-15 MED ORDER — LACTATED RINGERS IV SOLN: INTRAVENOUS | Status: DC

## 2022-04-15 MED ORDER — BISACODYL 10 MG RE SUPP: 10.0000 mg | Freq: Every day | RECTAL | Status: DC | PRN

## 2022-04-15 MED ORDER — LACTATED RINGERS IV BOLUS
1000.0000 mL | Freq: Once | INTRAVENOUS | Status: AC
  Administered 2022-04-15: 1000 mL via INTRAVENOUS

## 2022-04-15 NOTE — Progress Notes (Signed)
Occupational Therapy Treatment Patient Details Name: Andre [name] MRN: [id number] DOB: 13-Nov-1935 Today's Date: 04/15/2022   History of present illness presented to ER from home secondary to worsening confusion, agitation; admitted for management of acute L frontal SDH (stable across imaging) with small (L to R) midline shift   OT comments  Upon entering the room, pt supine in bed with eyes closed and mumbling. Pt appears to have applesauce all around beard and in corners of mouth. Total A for hygiene. OT using warm cloth over face to attempt to alert pt further but without success. OT does attempt to move pt in bed but he appears to increase in agitation with movement and therefore remains in bed. Pt unable to follow any commands this session. Pt does open eyes open briefly and nods head when asked if he is thirsty. Use of spoon to control amount of liquid going into mouth and pt appears to hold in mouth without swallowing until it full leaks from mouth. HOB remains at 40 degrees for safety with B hand mitts and fall risk mats placed on floor. Call bell and all needed items within reach. Bed alarm activated.    Recommendations for follow up therapy are one component of a multi-disciplinary discharge planning process, led by the attending physician.  Recommendations may be updated based on patient status, additional functional criteria and insurance authorization.    Follow Up Recommendations  Long-term institutional care without follow-up therapy     Assistance Recommended at Discharge Frequent or constant Supervision/Assistance  Patient can return home with the following  A lot of help with walking and/or transfers;A lot of help with bathing/dressing/bathroom;Assistance with cooking/housework;Assistance with feeding;Help with stairs or ramp for entrance;Assist for transportation;Direct supervision/assist for financial management;Direct supervision/assist for medications management   Equipment  Recommendations  None recommended by OT       Precautions / Restrictions Precautions Precautions: Fall Restrictions Weight Bearing Restrictions: No              ADL either performed or assessed with clinical judgement   ADL Overall ADL's : Needs assistance/impaired                                       General ADL Comments: total A to wash face and to attempt sips from spoon    Extremity/Trunk Assessment Upper Extremity Assessment Upper Extremity Assessment: Generalized weakness   Lower Extremity Assessment Lower Extremity Assessment: Generalized weakness        Vision Patient Visual Report: No change from baseline            Cognition Arousal/Alertness: Lethargic Behavior During Therapy: Flat affect, Restless Overall Cognitive Status: No family/caregiver present to determine baseline cognitive functioning                                 General Comments: Pt unable to follow any commands and minimal time with eyes open during session. Pt mumbles throughout and speech is mostly unintelligable.                   Pertinent Vitals/ Pain       Pain Assessment Pain Assessment: Faces Faces Pain Scale: No hurt         Frequency  Min 2X/week        Progress Toward Goals  OT Goals(current goals  can now be found in the care plan section)  Progress towards OT goals: Not progressing toward goals - comment;OT to reassess next treatment  Acute Rehab OT Goals Patient Stated Goal: none stated OT Goal Formulation: Patient unable to participate in goal setting Time For Goal Achievement: 04/19/22 Potential to Achieve Goals: [place] Frequency remains appropriate;Discharge plan needs to be updated       AM-PAC OT "6 Clicks" Daily Activity     Outcome Measure   Help from another person eating meals?: Total Help from another person taking care of personal grooming?: Total Help from another person toileting, which includes  using toliet, bedpan, or urinal?: Total Help from another person bathing (including washing, rinsing, drying)?: Total Help from another person to put on and taking off regular upper body clothing?: Total Help from another person to put on and taking off regular lower body clothing?: Total 6 Click Score: 6    End of Session    OT Visit Diagnosis: Unsteadiness on feet (R26.81);Repeated falls (R29.6);Muscle weakness (generalized) (M62.81)   Activity Tolerance Patient limited by fatigue;Other (comment) (limited by cognition)   Patient Left in bed;with bed alarm set;with call bell/phone within reach   Nurse Communication          Time: 2023-3435 OT Time Calculation (min): 10 min  Charges: OT General Charges $OT Visit: 1 Visit OT Treatments $Self Care/Home Management : 8-22 mins  Darleen Crocker, MS, OTR/L , CBIS ascom [phone]  04/15/22, 1:09 PM

## 2022-04-15 NOTE — Progress Notes (Signed)
PROGRESS NOTE    Andre [name]  NWG:[id number] DOB: May 25, 1935 DOA: 03/24/2022 PCP: [name], MD    Brief Narrative:  86 year old M with PMH of dementia, CAD s/p CABG, paroxysmal A-fib on Eliquis, Parkinson's dementia, OSA on CPAP, HFpEF and frequent falls who was brought to the ED with concerns for increased confusion and agitation that has been ongoing but worse over the past week.  Wife reports patient has been falling and last fell 2 days ago,  hitting his head.  CT head showed an acute subdural hemorrhage with minimal left-to-right midline shift.  Patient was evaluated by neurosurgery Dr. Cari Caraway who is stated patient is not a surgical candidate given his dementia and frailty.  Patient was given Kcentra for Eliquis reversal.  Patient is started on Cardene drip for hypertension.  Repeat CT head shows stable subdural hematoma.  Patient remains confused and delirious.  Also concerned about UTI.  Did on antibiotics.  Urine culture ordered on 12/26 but not collected.  Repeat urine culture ordered on 12/28.  Patient remains encephalopathic and delirious.  Palliative medicine consulted.  Assessment & Plan:   Principal Problem:   Subdural hematoma (HCC) Active Problems:   OSA on CPAP   PAF (paroxysmal atrial fibrillation) (HCC)   Thrombocytopenia (HCC)   Parkinson's disease   Coronary artery disease involving native coronary artery of native heart with angina pectoris (HCC)   Essential hypertension   Hypothyroidism   COPD (chronic obstructive pulmonary disease) (HCC)   Slurred speech   Delirium due to multiple etiologies, acute, hyperactive   Dementia associated with Parkinson's disease ([place])   Frequent falls   Chronic anticoagulation   Hypertensive urgency   Chronic diastolic CHF (congestive heart failure) (HCC)   SDH (subdural hematoma) (HCC)  Delirium/acute metabolic encephalopathy/slurred speech in patient with Parkinson's dementia: patient is totally disoriented.no  focal neurosymptoms but Limited neuroexam due to  patient's mental status.  He has significant leukocytosis and fever.  UA concerning for UTI.  Full RVP, COVID-19, influenza and RSV PCR nonreactive.  CT head, TSH, B12, ammonia and VBG unrevealing.  CXR without acute finding.  No meningeal signs. -Minimize benzodiazepines.  Not able to use antipsychotics due to history of Parkinson's. -Delirium, fall and aspiration precautions -Continue Sinemet and Comtan. -Continue IV ceftriaxone for possible UTI.  Unfortunately, urine culture were not sent -LR bolus 1000 cc x 1 followed by IV LR at 125 cc an hour -Monitor urine output  -Scheduled bowel regimen possible constipation -Repeat CT head without contrast -Extensive GOC with patient's wife and daughter at bedside.  Palliative medicine consulted  Subdural hematoma in the setting of chronic anticoagulation and recurrent falls.  Received Kcentra for Eliquis reversal.  Eliquis discontinued.  Repeat CT head with stable SDH.  Neurosurgery consulted and did not feel patient is a surgical candidate for resection of subdural hematoma given his underlying dementia, I recommended conservative care. -Repeat CT head without contrast. -Continue PT/OT  Dysphagia -Continue dysphagia 1 diet per SLP. -Aspiration precaution   Chronic Diastolic CHF: Appears euvolemic.  At risk for dehydration due to dementia and confusion. -Closely monitor respiratory and fluid status while on IV fluid boluses -Continue holding home Lasix and losartan. -Monitor respiratory and fluid status while on IV fluids -Continue metoprolol.   Hypertensive urgency: BP within acceptable range. -Cardiac meds as above   Chronic COPD: Stable. -Continue Stiolto Respimat with as needed albuterol.   History of CAD: Troponin negative.  EKG without acute ischemic finding. -Continue metoprolol.  Continue  rosuvastatin,  Eliquis discontinued.    Hyponatremia: Na 149.  Likely from dehydration. -LR  bolus followed by continuous infusion  Obstructive sleep apnea on CPAP: No hypercapnia on VBG. -Continue CPAP nightly if able to tolerate.  Goal of care discussion: Patient is already DNR/DNI.  He has Parkinson's dementia.  Now with subdural hematoma and persistent delirium without improvement.  Had extensive goals of care discussion with patient's wife and daughter, Marijo File at bedside.  I have discussed pros and cons of continuing current level of care or transitioning to comfort care.  He has been confused restless and delirious without significant improvement for the last 3 days have seen him.  He has Parkinson's dementia that precludes use of antipsychotics to manage his delirium.  He has been on IV Ativan which could potentially make his delirium worse.  I do not believe physical restraint will help him either.  Family seems to be leaning toward comfort care but has not decided yet.  They would like to discuss further and let me know.  Meanwhile, they requested repeat CT head which I have ordered. Palliative medicine consulted as well.    DVT prophylaxis: SCDs due to intracranial bleed Code Status: DNR Family Communication: Updated patient's wife and daughter, Carmell Austria at bedside. Disposition Plan:   Status is: Inpatient Remains inpatient appropriate because: Delirium/acute metabolic encephalopathy and possible UTI  Final disposition: TBD   Consultants:  Neurosurgery-signed off Palliative medicine   Subjective: Seen and examined earlier this morning and later this afternoon.  Patient remains confused and disoriented.  Has been restless and fidgeting.  Does not follow command.  Objective: Vitals:   04/14/22 1929 04/15/22 0001 04/15/22 0757 04/15/22 1216  BP: (!) 144/90 (!) 141/63 (!) 140/64 (!) 112/57  Pulse: 73 76 90 72  Resp: 18 (!) '21 20 20  '$ Temp: (!) 100.6 F (38.1 C) 98.4 F (36.9 C) 98.5 F (36.9 C) 99.6 F (37.6 C)  TempSrc:  Oral Oral Oral  SpO2: 100% 94% 93%  92%  Weight:      Height:        Intake/Output Summary (Last 24 hours) at 04/15/2022 1611 Last data filed at 04/15/2022 1500 Gross per 24 hour  Intake 1140.83 ml  Output 1450 ml  Net -309.17 ml   Filed Weights   04/14/2022 1932 04/10/22 0500 04/14/22 0744  Weight: 94 kg 94.3 kg 94 kg    Examination: GENERAL: Restless and fidgeting. HEENT: MMM.  Vision and hearing grossly intact.  NECK: Supple.  No apparent JVD.  RESP:  No IWOB.  Fair aeration bilaterally. CVS:  RRR. Heart sounds normal.  ABD/GI/GU: BS+. Abd soft, NTND.  MSK/EXT:  Moves extremities. No apparent deformity. No edema.  SKIN: no apparent skin lesion or wound NEURO: Awake but not oriented.  Does not follow command.  Moves all extremities.  No facial asymmetry. PSYCH: Restless and fidgety.    Data Reviewed: I have personally reviewed following labs and imaging studies  CBC: Recent Labs  Lab 04/12/22 0454 04/13/22 0629 04/14/22 0400 04/15/22 0735  WBC 30.7* 25.3* 21.0* 22.1*  HGB 16.3 16.1 14.8 14.8  HCT 50.8 49.8 46.9 46.6  MCV 92.4 91.5 93.2 93.2  PLT 202 217 235 244   Basic Metabolic Panel: Recent Labs  Lab 04/09/22 0432 04/12/22 0454 04/13/22 0629 04/14/22 0400 04/15/22 0735  NA  --  137 141 143 149*  K 4.2 4.3 3.9 3.4* 4.0  CL  --  102 107 110 120*  CO2  --  $'26 24 24 25  'V$ GLUCOSE  --  178* 185* 234* 128*  BUN  --  41* 51* 48* 44*  CREATININE  --  1.19 1.25* 1.18 1.28*  CALCIUM  --  9.2 9.3 9.2 9.0  MG  --  2.4 2.7* 2.5* 2.7*  PHOS  --  3.7 3.4 2.9 3.4   GFR: Estimated Creatinine Clearance: 47 mL/min (A) (by C-G formula based on SCr of 1.28 mg/dL (H)). Liver Function Tests: Recent Labs  Lab 04/14/22 0400 04/15/22 0735  AST 74*  --   ALT 33  --   ALKPHOS 148*  --   BILITOT 0.8  --   PROT 6.6  --   ALBUMIN 2.6* 2.5*   No results for input(s): "LIPASE", "AMYLASE" in the last 168 hours. Recent Labs  Lab 04/13/22 1416  AMMONIA 26   Coagulation Profile: No results for input(s):  "INR", "PROTIME" in the last 168 hours.  Cardiac Enzymes: Recent Labs  Lab 04/14/22 0400 04/15/22 0735  CKTOTAL 31* 44*   BNP (last 3 results) No results for input(s): "PROBNP" in the last 8760 hours. HbA1C: Recent Labs    04/14/22 0400  HGBA1C 5.8*   CBG: Recent Labs  Lab 04/14/22 0759 04/14/22 1729 04/14/22 2354 04/15/22 0756 04/15/22 1214  GLUCAP 184* 137* 150* 143* 140*   Lipid Profile: No results for input(s): "CHOL", "HDL", "LDLCALC", "TRIG", "CHOLHDL", "LDLDIRECT" in the last 72 hours. Thyroid Function Tests: Recent Labs    04/13/22 1407  TSH 2.976   Anemia Panel: Recent Labs    04/13/22 1407  VITAMINB12 206   Sepsis Labs: No results for input(s): "PROCALCITON", "LATICACIDVEN" in the last 168 hours.  No results found for this or any previous visit (from the past 240 hour(s)).   Radiology Studies: No results found.  Scheduled Meds:  amLODipine  5 mg Oral Daily   buPROPion  150 mg Oral BID   carbidopa-levodopa  0.5 tablet Oral QID   entacapone  200 mg Oral TID   famotidine  20 mg Oral BID   feeding supplement (NEPRO CARB STEADY)  237 mL Oral TID BM   insulin aspart  0-5 Units Subcutaneous QHS   insulin aspart  0-9 Units Subcutaneous TID WC   levothyroxine  50 mcg Oral Q0600   metoprolol tartrate  25 mg Oral BID   polyethylene glycol  17 g Oral Daily   rosuvastatin  10 mg Oral Daily   senna-docusate  1 tablet Oral BID   sertraline  50 mg Oral Daily   sodium chloride flush  3 mL Intravenous Q12H   umeclidinium bromide  1 puff Inhalation Daily   Continuous Infusions:  cefTRIAXone (ROCEPHIN)  IV 1 g (04/15/22 1212)   lactated ringers 125 mL/hr at 04/15/22 1202     LOS: 11 days     Mercy Riding, MD [facility]   If 7PM-7AM, please contact night-coverage Creatinine is stable.

## 2022-04-15 NOTE — Progress Notes (Signed)
Physical Therapy Treatment Patient Details Name: Andre [name] MRN: [id number] DOB: 1935/04/28 Today's Date: 04/15/2022   History of Present Illness presented to ER from home secondary to worsening confusion, agitation; admitted for management of acute L frontal SDH (stable across imaging) with small (L to R) midline shift    PT Comments    Patient with eyes closed throughout session, intermittently grimacing/moaning and mumbling/vocalizing incomprehensible sounds throughout session.  Unable to follow commands; unable to attend to therapist or actively participate with session this date.  Dep/total assist +1-2 for all bed mobility and repositioning; trunk/shoulders lists to L despite multiple attempts at repositioning.  To date, patient has demonstrated inconsistent and generally limited ability to actively participate and progress with skilled interventions.  Will re-assess status and progress towards goals/appropriateness for skilled PT services next session.     Recommendations for follow up therapy are one component of a multi-disciplinary discharge planning process, led by the attending physician.  Recommendations may be updated based on patient status, additional functional criteria and insurance authorization.  Follow Up Recommendations  Skilled nursing-short term rehab (<3 hours/day) Can patient physically be transported by private vehicle: No   Assistance Recommended at Discharge Frequent or constant Supervision/Assistance  Patient can return home with the following Two people to help with walking and/or transfers;Two people to help with bathing/dressing/bathroom   Equipment Recommendations       Recommendations for Other Services       Precautions / Restrictions Precautions Precautions: Fall Restrictions Weight Bearing Restrictions: No     Mobility  Bed Mobility Overal bed mobility: Needs Assistance Bed Mobility: Rolling Rolling: Total assist, +2 for physical  assistance         General bed mobility comments: dep +1-2 for all repositioning in bed; tends to list to L despite multiple efforts to reposition and maintain in midline    Transfers                   General transfer comment: unsafe/unable    Ambulation/Gait               General Gait Details: unsafe/unable   Stairs             Wheelchair Mobility    Modified Rankin (Stroke Patients Only)       Balance                                            Cognition Arousal/Alertness: Lethargic Behavior During Therapy: Restless Overall Cognitive Status: No family/caregiver present to determine baseline cognitive functioning                                          Exercises Other Exercises Other Exercises: Act assist/passive ROM to bilat LEs, 1x10, for strength/flexibility.  Very limited ability to follow commands, actively participate with/attend to task.    General Comments        Pertinent Vitals/Pain Pain Assessment Pain Assessment: Faces Breathing: occasional labored breathing, short period of hyperventilation Negative Vocalization: occasional moan/groan, low speech, negative/disapproving quality Facial Expression: sad, frightened, frown Body Language: tense, distressed pacing, fidgeting Consolability: distracted or reassured by voice/touch PAINAD Score: 5 Pain Descriptors / Indicators: Moaning, Grimacing Pain Intervention(s): Repositioned, Limited activity within patient's tolerance    Home Living  Prior Function            PT Goals (current goals can now be found in the care plan section) Acute Rehab PT Goals Patient Stated Goal: patient unable to participate in goal setting PT Goal Formulation: Patient unable to participate in goal setting Time For Goal Achievement: 04/19/22 Progress towards PT goals: Not progressing toward goals - comment    Frequency            PT Plan Current plan remains appropriate    Co-evaluation              AM-PAC PT "6 Clicks" Mobility   Outcome Measure  Help needed turning from your back to your side while in a flat bed without using bedrails?: Total Help needed moving from lying on your back to sitting on the side of a flat bed without using bedrails?: Total Help needed moving to and from a bed to a chair (including a wheelchair)?: Total Help needed standing up from a chair using your arms (e.g., wheelchair or bedside chair)?: Total Help needed to walk in hospital room?: Total Help needed climbing 3-5 steps with a railing? : Total 6 Click Score: 6    End of Session   Activity Tolerance: Patient limited by fatigue Patient left: in bed;with call bell/phone within reach;with bed alarm set;with nursing/sitter in room Nurse Communication: Mobility status PT Visit Diagnosis: Muscle weakness (generalized) (M62.81);Difficulty in walking, not elsewhere classified (R26.2);Other symptoms and signs involving the nervous system (R29.898) Hemiplegia - caused by: Nontraumatic intracerebral hemorrhage     Time: 3825-0539 PT Time Calculation (min) (ACUTE ONLY): 15 min  Charges:  $Therapeutic Exercise: 8-22 mins                    Ngoc Daughtridge H. Owens Shark, PT, DPT, NCS 04/15/22, 8:42 PM [phone]

## 2022-04-15 NOTE — Plan of Care (Signed)
  Problem: Education: Goal: Knowledge of condition and prescribed therapy will improve Outcome: Progressing   Problem: Cardiac: Goal: Will achieve and/or maintain adequate cardiac output Outcome: Progressing   Problem: Physical Regulation: Goal: Complications related to the disease process, condition or treatment will be avoided or minimized Outcome: Progressing   

## 2022-04-16 ENCOUNTER — Inpatient Hospital Stay: Payer: HMO

## 2022-04-16 DIAGNOSIS — G20A1 Parkinson's disease without dyskinesia, without mention of fluctuations: Secondary | ICD-10-CM | POA: Diagnosis not present

## 2022-04-16 DIAGNOSIS — R4781 Slurred speech: Secondary | ICD-10-CM | POA: Diagnosis not present

## 2022-04-16 DIAGNOSIS — S065XAA Traumatic subdural hemorrhage with loss of consciousness status unknown, initial encounter: Secondary | ICD-10-CM | POA: Diagnosis not present

## 2022-04-16 DIAGNOSIS — D696 Thrombocytopenia, unspecified: Secondary | ICD-10-CM | POA: Diagnosis not present

## 2022-04-16 LAB — RESP PANEL BY RT-PCR (RSV, FLU A&B, COVID)  RVPGX2
Influenza A by PCR: NEGATIVE
Influenza B by PCR: NEGATIVE
Resp Syncytial Virus by PCR: NEGATIVE
SARS Coronavirus 2 by RT PCR: NEGATIVE

## 2022-04-16 LAB — RESPIRATORY PANEL BY PCR

## 2022-04-16 LAB — CBC WITH DIFFERENTIAL/PLATELET
Abs Immature Granulocytes: 0.64 10*3/uL — ABNORMAL HIGH (ref 0.00–0.07)
Basophils Absolute: 0.1 10*3/uL (ref 0.0–0.1)
Basophils Relative: 0 %
Eosinophils Absolute: 0.1 10*3/uL (ref 0.0–0.5)
Eosinophils Relative: 0 %
HCT: 46 % (ref 39.0–52.0)
Hemoglobin: 14.4 g/dL (ref 13.0–17.0)
Immature Granulocytes: 3 %
Lymphocytes Relative: 8 %
Lymphs Abs: 1.7 10*3/uL (ref 0.7–4.0)
MCH: 29.4 pg (ref 26.0–34.0)
MCHC: 31.3 g/dL (ref 30.0–36.0)
MCV: 93.9 fL (ref 80.0–100.0)
Monocytes Absolute: 1.9 10*3/uL — ABNORMAL HIGH (ref 0.1–1.0)
Monocytes Relative: 8 %
Neutro Abs: 18.3 10*3/uL — ABNORMAL HIGH (ref 1.7–7.7)
Neutrophils Relative %: 81 %
Platelets: 242 10*3/uL (ref 150–400)
RBC: 4.9 MIL/uL (ref 4.22–5.81)
RDW: 14.6 % (ref 11.5–15.5)
WBC: 22.7 10*3/uL — ABNORMAL HIGH (ref 4.0–10.5)
nRBC: 0.1 % (ref 0.0–0.2)

## 2022-04-16 LAB — RENAL FUNCTION PANEL
Albumin: 2.3 g/dL — ABNORMAL LOW (ref 3.5–5.0)
Albumin: 2.3 g/dL — ABNORMAL LOW (ref 3.5–5.0)
Anion gap: 7 (ref 5–15)
Anion gap: 6 (ref 5–15)
BUN: 41 mg/dL — ABNORMAL HIGH (ref 8–23)
BUN: 38 mg/dL — ABNORMAL HIGH (ref 8–23)
CO2: 24 mmol/L (ref 22–32)
CO2: 23 mmol/L (ref 22–32)
Calcium: 8.7 mg/dL — ABNORMAL LOW (ref 8.9–10.3)
Calcium: 8.5 mg/dL — ABNORMAL LOW (ref 8.9–10.3)
Chloride: 122 mmol/L — ABNORMAL HIGH (ref 98–111)
Chloride: 123 mmol/L — ABNORMAL HIGH (ref 98–111)
Creatinine, Ser: 1.3 mg/dL — ABNORMAL HIGH (ref 0.61–1.24)
Creatinine, Ser: 1.27 mg/dL — ABNORMAL HIGH (ref 0.61–1.24)
GFR, Estimated: 54 mL/min — ABNORMAL LOW (ref >60)
GFR, Estimated: 56 mL/min — ABNORMAL LOW (ref >60)
Glucose, Bld: 128 mg/dL — ABNORMAL HIGH (ref 70–99)
Glucose, Bld: 150 mg/dL — ABNORMAL HIGH (ref 70–99)
Phosphorus: 4.1 mg/dL (ref 2.5–4.6)
Phosphorus: 3.2 mg/dL (ref 2.5–4.6)
Potassium: 3.9 mmol/L (ref 3.5–5.1)
Potassium: 3.8 mmol/L (ref 3.5–5.1)
Sodium: 153 mmol/L — ABNORMAL HIGH (ref 135–145)
Sodium: 152 mmol/L — ABNORMAL HIGH (ref 135–145)

## 2022-04-16 LAB — BRAIN NATRIURETIC PEPTIDE: B Natriuretic Peptide: 618.3 pg/mL — ABNORMAL HIGH (ref 0.0–100.0)

## 2022-04-16 LAB — MRSA NEXT GEN BY PCR, NASAL: MRSA by PCR Next Gen: NOT DETECTED

## 2022-04-16 LAB — MAGNESIUM: Magnesium: 2.7 mg/dL — ABNORMAL HIGH (ref 1.7–2.4)

## 2022-04-16 LAB — GLUCOSE, CAPILLARY
Glucose-Capillary: 132 mg/dL — ABNORMAL HIGH (ref 70–99)
Glucose-Capillary: 196 mg/dL — ABNORMAL HIGH (ref 70–99)
Glucose-Capillary: 144 mg/dL — ABNORMAL HIGH (ref 70–99)

## 2022-04-16 MED ORDER — ENTACAPONE 200 MG PO TABS
200.0000 mg | ORAL_TABLET | Freq: Four times a day (QID) | ORAL | Status: DC
  Administered 2022-04-16 – 2022-04-20 (×16): 200 mg via ORAL
  Filled 2022-04-16 (×30): qty 1

## 2022-04-16 MED ORDER — THIAMINE HCL 100 MG/ML IJ SOLN
250.0000 mg | Freq: Three times a day (TID) | INTRAVENOUS | Status: AC
  Administered 2022-04-17 (×2): 250 mg via INTRAVENOUS
  Filled 2022-04-16 (×3): qty 2.5

## 2022-04-16 MED ORDER — THIAMINE HCL 100 MG/ML IJ SOLN
500.0000 mg | Freq: Three times a day (TID) | INTRAVENOUS | Status: AC
  Administered 2022-04-16 – 2022-04-17 (×3): 500 mg via INTRAVENOUS
  Filled 2022-04-16 (×3): qty 5

## 2022-04-16 MED ORDER — THIAMINE HCL 100 MG/ML IJ SOLN
100.0000 mg | Freq: Every day | INTRAMUSCULAR | Status: DC
  Administered 2022-04-20: 100 mg via INTRAVENOUS
  Filled 2022-04-16: qty 2

## 2022-04-16 MED ORDER — VANCOMYCIN HCL 2000 MG/400ML IV SOLN
2000.0000 mg | Freq: Once | INTRAVENOUS | Status: DC
  Filled 2022-04-16: qty 400

## 2022-04-16 MED ORDER — SODIUM CHLORIDE 0.9 % IV SOLN: INTRAVENOUS | Status: DC | PRN

## 2022-04-16 MED ORDER — VANCOMYCIN HCL 1250 MG/250ML IV SOLN
1250.0000 mg | INTRAVENOUS | Status: DC
  Administered 2022-04-17: 1250 mg via INTRAVENOUS
  Filled 2022-04-16 (×2): qty 250

## 2022-04-16 MED ORDER — THIAMINE HCL 100 MG/ML IJ SOLN
100.0000 mg | Freq: Three times a day (TID) | INTRAMUSCULAR | Status: AC
  Administered 2022-04-18 (×2): 100 mg via INTRAVENOUS
  Filled 2022-04-16 (×3): qty 2

## 2022-04-16 MED ORDER — SPIRONOLACTONE 25 MG PO TABS
25.0000 mg | ORAL_TABLET | Freq: Every day | ORAL | Status: DC
  Administered 2022-04-16 – 2022-04-20 (×4): 25 mg via ORAL
  Filled 2022-04-16 (×5): qty 1

## 2022-04-16 MED ORDER — IOHEXOL 300 MG/ML  SOLN
100.0000 mL | Freq: Once | INTRAMUSCULAR | Status: AC | PRN
  Administered 2022-04-16: 100 mL via INTRAVENOUS

## 2022-04-16 MED ORDER — FUROSEMIDE 10 MG/ML IJ SOLN
40.0000 mg | Freq: Once | INTRAMUSCULAR | Status: AC
  Administered 2022-04-16: 40 mg via INTRAVENOUS
  Filled 2022-04-16: qty 4

## 2022-04-16 MED ORDER — VANCOMYCIN HCL IN DEXTROSE 1-5 GM/200ML-% IV SOLN
1000.0000 mg | Freq: Once | INTRAVENOUS | Status: AC
  Administered 2022-04-16: 1000 mg via INTRAVENOUS
  Filled 2022-04-16: qty 200

## 2022-04-16 MED ORDER — IOHEXOL 9 MG/ML PO SOLN
500.0000 mL | ORAL | Status: DC
  Administered 2022-04-16 (×2): 500 mL via ORAL

## 2022-04-16 MED ORDER — PIPERACILLIN-TAZOBACTAM 3.375 G IVPB
3.3750 g | Freq: Three times a day (TID) | INTRAVENOUS | Status: DC
  Administered 2022-04-16 – 2022-04-19 (×9): 3.375 g via INTRAVENOUS
  Filled 2022-04-16 (×10): qty 50

## 2022-04-16 MED ORDER — VANCOMYCIN HCL 1250 MG/250ML IV SOLN: 1250.0000 mg | INTRAVENOUS | Status: DC

## 2022-04-16 MED ORDER — DEXTROSE 5 % IV SOLN: INTRAVENOUS | Status: DC

## 2022-04-16 NOTE — Consult Note (Signed)
Pharmacy Antibiotic Note  Andre [name] is a 86 y.o. male with PMH including CAD s/p CABG, Afib on Eliquis, Parkinson's dementia, OSA on CPAP, HFpEF admitted on 03/29/2022 with  delirium / AMS / subdural hematoma in setting of anticoagulation and recurrent falls .  Pharmacy has been consulted for Zosyn and vancomycin dosing.  Patient has been on ceftriaxone and received 5 days of therapy. With persistent leukocytosis and still intermittently febrile.   Plan:  Zosyn 3.375 g IV q8h (4-hr infusion)  Vancomycin 2 g IV followed by maintenance regimen of 1.25 g IV q24h --Calculated AUC: 486, Cmin 13.1 --Vd 0.72 (BMI 32), Scr 1.27 --Daily Scr per protocol --Levels at steady state or as clinically indicated  Height: '5\' 7"'$  (170.2 cm) Weight: 94 kg (207 lb 4.8 oz) IBW/kg (Calculated) : 66.1  Temp (24hrs), Avg:98.8 F (37.1 C), Min:97.9 F (36.6 C), Max:100.8 F (38.2 C)  Recent Labs  Lab 04/12/22 0454 04/13/22 0629 04/14/22 0400 04/15/22 0735 04/16/22 0617 04/16/22 1420  WBC 30.7* 25.3* 21.0* 22.1* 22.7*  --   CREATININE 1.19 1.25* 1.18 1.28* 1.30* 1.27*    Estimated Creatinine Clearance: 47.3 mL/min (A) (by C-G formula based on SCr of 1.27 mg/dL (H)).    Allergies  Allergen Reactions   Pravastatin Other (See Comments)   Prednisone Other (See Comments)    Pt states that med makes him hyper Pt states that med makes him hyper    Antimicrobials this admission: Ceftriaxone 12/26 >> 12/30 Zosyn 12/30 >>  Vancomycin 12/30 >>   Dose adjustments this admission: N/A  Microbiology results: 12/30 BCx: pending 12/30 UCx: pending 12/30 MRSA PCR: pending  Thank you for allowing pharmacy to be a part of this patient's care.  Benita Gutter 04/16/2022 3:46 PM

## 2022-04-16 NOTE — Progress Notes (Addendum)
PROGRESS NOTE    Andre [name]  WIO:[id number] DOB: July 05, 1935 DOA: 04/06/2022 PCP: [name], MD    Brief Narrative:  86 year old M with PMH of dementia, CAD s/p CABG, paroxysmal A-fib on Eliquis, Parkinson's dementia, OSA on CPAP, HFpEF and frequent falls who was brought to the ED with concerns for increased confusion and agitation that has been ongoing but worse over the past week.  Wife reports patient has been falling and last fell 2 days ago,  hitting his head.  CT head showed an acute subdural hemorrhage with minimal left-to-right midline shift.  Patient was evaluated by neurosurgery Dr. Cari Caraway who is stated patient is not a surgical candidate given his dementia and frailty.  Patient was given Kcentra for Eliquis reversal.  Patient is started on Cardene drip for hypertension.  Repeat CT head on 12/19 showed stable subdural hematoma but slight progression on CT head on 12/29.    Patient remains confused and delirious.  Some concern about UTI and started on IV ceftriaxone but urine culture was not collected.  Mental status did not improve with antibiotics.  He has persistent leukocytosis.  Extensive goals of care discussion with patient's daughter and wife at bedside on 12/29.  Family wanted to exhaust every avenue before changing goal of care.  Order CT chest/abdomen/pelvis, blood culture, urine culture, and broadened antibiotics.  Family also likes to try tube feeding.  Started high-dose thiamine as well.  Palliative medicine consulted and yet to see patient.   Assessment & Plan:   Principal Problem:   Subdural hematoma (HCC) Active Problems:   OSA on CPAP   PAF (paroxysmal atrial fibrillation) (HCC)   Thrombocytopenia (HCC)   Parkinson's disease   Coronary artery disease involving native coronary artery of native heart with angina pectoris (HCC)   Goals of care, counseling/discussion   Essential hypertension   Hypothyroidism   COPD (chronic obstructive pulmonary  disease) (HCC)   Slurred speech   Delirium due to multiple etiologies, acute, hyperactive   Dementia associated with Parkinson's disease ([place])   Frequent falls   Chronic anticoagulation   Hypertensive urgency   Chronic diastolic CHF (congestive heart failure) (HCC)   SDH (subdural hematoma) ([place])   DNR (do not resuscitate)  Delirium/acute metabolic encephalopathy/slurred speech in patient with Parkinson's dementia: patient is totally disoriented without focal neurodeficit.  He has significant leukocytosis and mild fever.  UA concerning for UTI.  Full RVP, COVID-19, influenza and RSV PCR nonreactive. TSH, B12, ammonia and VBG unrevealing.  CT head with relatively stable subdural hematoma.  No meningeal signs.  No improvement in mental status with IV ceftriaxone.  He has persistent leukocytosis.  He is getting IV Ativan intermittently which could contribute.  Not a candidate for antipsychotics due to Parkinson's. -Goal of care discussion on 04/15/2022 -High-dose thiamine, urine culture, blood culture, CT chest/abdomen/pelvis -Escalate antibiotics to vancomycin and Zosyn -Insert NG tube and initiate tube feed. -Delirium, fall and aspiration precautions -Continue Sinemet and Comtan. -Monitor urine output closely. -Scheduled bowel regimen possible constipation -Palliative care consult requested.  Subdural hematoma in the setting of chronic anticoagulation and recurrent falls.  Received Kcentra for Eliquis reversal.  Eliquis discontinued.  Repeat CT on 12/19 without significant change of SDH.  Neurosurgery consulted and did not feel patient is a surgical candidate for resection of subdural hematoma given his underlying dementia, and recommended conservative care.  Repeat CT head on 12/29 with slight progression -Continue PT/OT  Dysphagia -Continue dysphagia 1 diet per SLP. -Initiate tube feed -  Aspiration precaution   Acute on chronic Diastolic CHF: Patient was on IV fluid due to dehydration  and poor p.o. intake.  Developed some wheezing and shortness of breath.  CXR concerning for cardiomegaly and pulmonary edema. BNP slightly  -IV Lasix 40 mg x 1 -Given hypernatremia, add Aldactone 25 mg daily -Discontinue IV fluid -Closely monitor respiratory and fluid status while on IV fluid boluses -Continue metoprolol.   Hypertensive urgency: BP within acceptable range. -Cardiac meds as above   Chronic COPD: Stable. -Continue Stiolto Respimat with as needed albuterol.   History of CAD: Troponin negative.  EKG without acute ischemic finding. -Continue metoprolol.  Continue rosuvastatin,  Eliquis discontinued.    Hypernatremia: Na trended up to 152. -Continue D5 infusion with concurrent use of diuretics-Lasix and Aldactone.  Obstructive sleep apnea on CPAP: No hypercapnia on VBG. -Continue CPAP nightly if able to tolerate.  Leukocytosis/bandemia: Remains elevated despite IV ceftriaxone.  Due to subdural hematoma? -Infectious workup as above. -Broad-spectrum antibiotics as above  Goal of care discussion: Remains DNR/DNI.  See goal of care discussion on 04/15/2022. Talked to patient's daughter and wife over the phone again on 12/30.  They like to exhaust every avenue before changing goal of care. -Palliative care consulted and yet to see patient.    DVT prophylaxis: SCDs due to intracranial bleed Code Status: DNR Family Communication: Updated patient's wife and daughter, Carmell Austria over the phone. Disposition Plan:   Status is: Inpatient Remains inpatient appropriate because: Delirium/acute metabolic encephalopathy and possible UTI  Final disposition: TBD   Consultants:  Neurosurgery-signed off Palliative medicine   Subjective: Seen and examined earlier this morning.  No major events overnight of this morning.  He was to be started by voice but does not open his eyes or follow commands.  Somewhat restless.   Objective: Vitals:   04/16/22 0053 04/16/22 0428 04/16/22 0751  04/16/22 1151  BP: (!) 109/90 118/72 123/62 122/74  Pulse: 60 72 74 71  Resp: '18 20 19 19  '$ Temp: 99.2 F (37.3 C) 97.9 F (36.6 C) 98 F (36.7 C) 98.1 F (36.7 C)  TempSrc:      SpO2: 96% 98% 94% 93%  Weight:      Height:        Intake/Output Summary (Last 24 hours) at 04/16/2022 1455 Last data filed at 04/16/2022 1334 Gross per 24 hour  Intake 2892.55 ml  Output 1050 ml  Net 1842.55 ml   Filed Weights   03/20/2022 1932 04/10/22 0500 04/14/22 0744  Weight: 94 kg 94.3 kg 94 kg    Examination: GENERAL: Restless and fidgeting. HEENT: MMM.  Vision and hearing grossly intact.  NECK: Supple.  No apparent JVD.  RESP:  No IWOB.  Fair aeration bilaterally. CVS:  RRR. Heart sounds normal.  ABD/GI/GU: BS+. Abd soft, NTND.  MSK/EXT:  Moves extremities. No apparent deformity. No edema.  SKIN: no apparent skin lesion or wound NEURO: Awake but not oriented.  Does not follow command.  Moves all extremities.  No facial asymmetry. PSYCH: Restless and fidgety.    Data Reviewed: I have personally reviewed following labs and imaging studies  CBC: Recent Labs  Lab 04/12/22 0454 04/13/22 0629 04/14/22 0400 04/15/22 0735 04/16/22 0617  WBC 30.7* 25.3* 21.0* 22.1* 22.7*  NEUTROABS  --   --   --   --  18.3*  HGB 16.3 16.1 14.8 14.8 14.4  HCT 50.8 49.8 46.9 46.6 46.0  MCV 92.4 91.5 93.2 93.2 93.9  PLT 202 217 235 250  448   Basic Metabolic Panel: Recent Labs  Lab 04/12/22 0454 04/13/22 0629 04/14/22 0400 04/15/22 0735 04/16/22 0617  NA 137 141 143 149* 153*  K 4.3 3.9 3.4* 4.0 3.9  CL 102 107 110 120* 122*  CO2 '26 24 24 25 24  '$ GLUCOSE 178* 185* 234* 128* 128*  BUN 41* 51* 48* 44* 41*  CREATININE 1.19 1.25* 1.18 1.28* 1.30*  CALCIUM 9.2 9.3 9.2 9.0 8.7*  MG 2.4 2.7* 2.5* 2.7* 2.7*  PHOS 3.7 3.4 2.9 3.4 4.1   GFR: Estimated Creatinine Clearance: 46.2 mL/min (A) (by C-G formula based on SCr of 1.3 mg/dL (H)). Liver Function Tests: Recent Labs  Lab 04/14/22 0400  04/15/22 0735 04/16/22 0617  AST 74*  --   --   ALT 33  --   --   ALKPHOS 148*  --   --   BILITOT 0.8  --   --   PROT 6.6  --   --   ALBUMIN 2.6* 2.5* 2.3*   No results for input(s): "LIPASE", "AMYLASE" in the last 168 hours. Recent Labs  Lab 04/13/22 1416  AMMONIA 26   Coagulation Profile: No results for input(s): "INR", "PROTIME" in the last 168 hours.  Cardiac Enzymes: Recent Labs  Lab 04/14/22 0400 04/15/22 0735  CKTOTAL 31* 44*   BNP (last 3 results) No results for input(s): "PROBNP" in the last 8760 hours. HbA1C: Recent Labs    04/14/22 0400  HGBA1C 5.8*   CBG: Recent Labs  Lab 04/15/22 0756 04/15/22 1214 04/15/22 1736 04/16/22 0752 04/16/22 1149  GLUCAP 143* 140* 120* 132* 196*   Lipid Profile: No results for input(s): "CHOL", "HDL", "LDLCALC", "TRIG", "CHOLHDL", "LDLDIRECT" in the last 72 hours. Thyroid Function Tests: No results for input(s): "TSH", "T4TOTAL", "FREET4", "T3FREE", "THYROIDAB" in the last 72 hours.  Anemia Panel: No results for input(s): "VITAMINB12", "FOLATE", "FERRITIN", "TIBC", "IRON", "RETICCTPCT" in the last 72 hours.  Sepsis Labs: No results for input(s): "PROCALCITON", "LATICACIDVEN" in the last 168 hours.  No results found for this or any previous visit (from the past 240 hour(s)).   Radiology Studies: DG Abd Portable 1V  Result Date: 04/15/2022 CLINICAL DATA:  Abdominal tenderness. EXAM: PORTABLE ABDOMEN - 1 VIEW COMPARISON:  None Available. FINDINGS: The bowel gas pattern is normal. Radiopaque surgical clips are seen overlying the right upper quadrant. No radio-opaque calculi or other significant radiographic abnormality are seen. IMPRESSION: Negative. Electronically Signed   By: Virgina Norfolk M.D.   On: 04/15/2022 19:21   CT HEAD WO CONTRAST (5MM)  Result Date: 04/15/2022 CLINICAL DATA:  Frequent falls EXAM: CT HEAD WITHOUT CONTRAST TECHNIQUE: Contiguous axial images were obtained from the base of the skull  through the vertex without intravenous contrast. RADIATION DOSE REDUCTION: This exam was performed according to the departmental dose-optimization program which includes automated exposure control, adjustment of the mA and/or kV according to patient size and/or use of iterative reconstruction technique. COMPARISON:  04/05/22 CT head FINDINGS: Brain: Evolving left cerebral convexity subdural hematoma which has increased in size compared to prior exam now measuring up to 1.4 cm (previously up to 1.2 cm), with interval decrease in the volume of hyperdense blood products. The volume of subdural blood products along the falx has also decreased from prior exam, but the degree rightward midline shift has increased, now measuring up to 5 mm, previously 3 mm. No hydrocephalus. No CT evidence of an acute infarct. Vascular: No hyperdense vessel or unexpected calcification. Skull: Normal. Negative for fracture or  focal lesion. Sinuses/Orbits: Bilateral lens replacement. Paranasal sinuses and mastoid air cells are clear. Other: None. IMPRESSION: 1. Evolving left cerebral convexity subdural hematoma, which has increased in size compared to prior exam, now measuring up to 1.4 cm (previously 1.2 cm), with interval decrease in the volume of hyperdense blood products. The volume of subdural blood products along the falx has also decreased from prior exam. 2. Increased rightward midline shift, now measuring up to 5 mm, previously 3 mm. Electronically Signed   By: Marin Roberts M.D.   On: 04/15/2022 17:06    Scheduled Meds:  amLODipine  5 mg Oral Daily   buPROPion  150 mg Oral BID   carbidopa-levodopa  0.5 tablet Oral QID   entacapone  200 mg Oral QID   famotidine  20 mg Oral BID   feeding supplement (NEPRO CARB STEADY)  237 mL Oral TID BM   insulin aspart  0-5 Units Subcutaneous QHS   insulin aspart  0-9 Units Subcutaneous TID WC   levothyroxine  50 mcg Oral Q0600   metoprolol tartrate  25 mg Oral BID   polyethylene  glycol  17 g Oral Daily   rosuvastatin  10 mg Oral Daily   senna-docusate  1 tablet Oral BID   sertraline  50 mg Oral Daily   sodium chloride flush  3 mL Intravenous Q12H   umeclidinium bromide  1 puff Inhalation Daily   Continuous Infusions:  cefTRIAXone (ROCEPHIN)  IV 1 g (04/16/22 1231)   dextrose 100 mL/hr at 04/16/22 1334     LOS: 12 days     Mercy Riding, MD [facility]   If 7PM-7AM, please contact night-coverage Creatinine is stable.

## 2022-04-16 NOTE — Plan of Care (Signed)
  Problem: Elimination: Goal: Will not experience complications related to urinary retention Outcome: Progressing   Problem: Safety: Goal: Ability to remain free from injury will improve Outcome: Progressing   

## 2022-04-17 ENCOUNTER — Inpatient Hospital Stay: Payer: HMO

## 2022-04-17 DIAGNOSIS — S065XAA Traumatic subdural hemorrhage with loss of consciousness status unknown, initial encounter: Secondary | ICD-10-CM | POA: Diagnosis not present

## 2022-04-17 LAB — COMPREHENSIVE METABOLIC PANEL
ALT: 30 U/L (ref 0–44)
AST: 99 U/L — ABNORMAL HIGH (ref 15–41)
Albumin: 2.2 g/dL — ABNORMAL LOW (ref 3.5–5.0)
Alkaline Phosphatase: 142 U/L — ABNORMAL HIGH (ref 38–126)
Anion gap: 7 (ref 5–15)
BUN: 40 mg/dL — ABNORMAL HIGH (ref 8–23)
CO2: 24 mmol/L (ref 22–32)
Calcium: 8.3 mg/dL — ABNORMAL LOW (ref 8.9–10.3)
Chloride: 122 mmol/L — ABNORMAL HIGH (ref 98–111)
Creatinine, Ser: 1.4 mg/dL — ABNORMAL HIGH (ref 0.61–1.24)
GFR, Estimated: 50 mL/min — ABNORMAL LOW (ref >60)
Glucose, Bld: 151 mg/dL — ABNORMAL HIGH (ref 70–99)
Potassium: 4.1 mmol/L (ref 3.5–5.1)
Sodium: 153 mmol/L — ABNORMAL HIGH (ref 135–145)
Total Bilirubin: 1.4 mg/dL — ABNORMAL HIGH (ref 0.3–1.2)
Total Protein: 6.2 g/dL — ABNORMAL LOW (ref 6.5–8.1)

## 2022-04-17 LAB — URINE CULTURE: Culture: NO GROWTH

## 2022-04-17 LAB — GLUCOSE, CAPILLARY
Glucose-Capillary: 126 mg/dL — ABNORMAL HIGH (ref 70–99)
Glucose-Capillary: 157 mg/dL — ABNORMAL HIGH (ref 70–99)
Glucose-Capillary: 130 mg/dL — ABNORMAL HIGH (ref 70–99)
Glucose-Capillary: 132 mg/dL — ABNORMAL HIGH (ref 70–99)
Glucose-Capillary: 137 mg/dL — ABNORMAL HIGH (ref 70–99)

## 2022-04-17 LAB — BRAIN NATRIURETIC PEPTIDE: B Natriuretic Peptide: 490.1 pg/mL — ABNORMAL HIGH (ref 0.0–100.0)

## 2022-04-17 LAB — PHOSPHORUS
Phosphorus: 4.3 mg/dL (ref 2.5–4.6)
Phosphorus: 3.6 mg/dL (ref 2.5–4.6)

## 2022-04-17 LAB — MAGNESIUM
Magnesium: 2.6 mg/dL — ABNORMAL HIGH (ref 1.7–2.4)
Magnesium: 2.6 mg/dL — ABNORMAL HIGH (ref 1.7–2.4)

## 2022-04-17 LAB — CK: Total CK: 94 U/L (ref 49–397)

## 2022-04-17 LAB — VANCOMYCIN, RANDOM: Vancomycin Rm: 10 ug/mL

## 2022-04-17 MED ORDER — VITAL HIGH PROTEIN PO LIQD: 1000.0000 mL | ORAL | Status: DC

## 2022-04-17 MED ORDER — PROSOURCE TF20 ENFIT COMPATIBL EN LIQD
60.0000 mL | Freq: Every day | ENTERAL | Status: DC
  Administered 2022-04-17 – 2022-04-20 (×4): 60 mL

## 2022-04-17 MED ORDER — OSMOLITE 1.5 CAL PO LIQD
1000.0000 mL | ORAL | Status: DC
  Administered 2022-04-17: 1000 mL

## 2022-04-17 NOTE — Progress Notes (Signed)
PROGRESS NOTE    Andre [name]  ZOX:[id number] DOB: [birth year] DOA: 04/03/2022 PCP: [name], MD    Brief Narrative:  86 year old M with PMH of dementia, CAD s/p CABG, paroxysmal A-fib on Eliquis, Parkinson's dementia, OSA on CPAP, HFpEF and frequent falls who was brought to the ED with concerns for increased confusion and agitation that has been ongoing but worse over the past week.  Wife reports patient has been falling and last fell 2 days ago,  hitting his head.  CT head showed an acute subdural hemorrhage with minimal left-to-right midline shift.  Patient was evaluated by neurosurgery Dr. Cari Caraway who is stated patient is not a surgical candidate given his dementia and frailty.  Patient was given Kcentra for Eliquis reversal.  Patient is started on Cardene drip for hypertension.  Repeat CT head on 12/19 showed stable subdural hematoma but slight progression on CT head on 12/29.    Patient remains confused and delirious.  Some concern about UTI and started on IV ceftriaxone but urine culture was not collected.  Mental status did not improve with antibiotics.  He has persistent leukocytosis.  Extensive goals of care discussion with patient's daughter and wife at bedside on 12/29.  Family wanted to exhaust every avenue before changing goal of care.  Order CT chest/abdomen/pelvis, blood culture, urine culture, and broadened antibiotics.  Family also likes to try tube feeding.  Started high-dose thiamine as well.  Palliative medicine consulted for ongoing goals of care discussions.   Assessment & Plan:   Principal Problem:   Subdural hematoma (HCC) Active Problems:   OSA on CPAP   PAF (paroxysmal atrial fibrillation) (HCC)   Thrombocytopenia (HCC)   Parkinson's disease   Coronary artery disease involving native coronary artery of native heart with angina pectoris (HCC)   Goals of care, counseling/discussion   Essential hypertension   Hypothyroidism   COPD (chronic obstructive  pulmonary disease) (HCC)   Slurred speech   Delirium due to multiple etiologies, acute, hyperactive   Dementia associated with Parkinson's disease ([place])   Frequent falls   Chronic anticoagulation   Hypertensive urgency   Chronic diastolic CHF (congestive heart failure) (HCC)   SDH (subdural hematoma) ([place])   DNR (do not resuscitate)  Delirium/acute metabolic encephalopathy/slurred speech in patient with Parkinson's dementia: patient is totally disoriented without focal neurodeficit.  He has significant leukocytosis and mild fever.  UA concerning for UTI.  Full RVP, COVID-19, influenza and RSV PCR nonreactive. TSH, B12, ammonia and VBG unrevealing.  CT head with relatively stable subdural hematoma.  No meningeal signs.  No improvement in mental status with IV ceftriaxone.  He has persistent leukocytosis.  He is getting IV Ativan intermittently which could contribute.  Not a candidate for antipsychotics due to Parkinson's. -Goal of care discussion on 04/15/2022 -High-dose thiamine, urine culture, blood culture, CT chest/abdomen/pelvis -Escalate antibiotics to vancomycin and Zosyn -NG tube placed  -Initiate tube feeds today (12/31) -Delirium, fall and aspiration precautions -Continue Sinemet and Comtan. -Monitor urine output closely. -Scheduled bowel regimen possible constipation -Palliative care consult requested.  Subdural hematoma in the setting of chronic anticoagulation and recurrent falls.  Received Kcentra for Eliquis reversal.  Eliquis discontinued.  Repeat CT on 12/19 without significant change of SDH.  Neurosurgery consulted and did not feel patient is a surgical candidate for resection of subdural hematoma given his underlying dementia, and recommended conservative care.  Repeat CT head on 12/29 with slight progression -Continue PT/OT as able  Dysphagia -Continue dysphagia 1 diet  per SLP. -Initiate tube feeds -Aspiration precaution   Acute on chronic Diastolic CHF: Patient was  on IV fluid due to dehydration and poor p.o. intake.  Developed some wheezing and shortness of breath.  CXR concerning for cardiomegaly and pulmonary edema. BNP slightly  -given IV Lasix 40 mg x 1 -Given hypernatremia, add Aldactone 25 mg daily -Closely monitor respiratory and fluid status while on D5w -Continue metoprolol.   Hypertensive urgency: BP within acceptable range. -Cardiac meds as above   Chronic COPD: Stable. -Continue Stiolto Respimat with as needed albuterol.   History of CAD: Troponin negative.  EKG without acute ischemic finding. -Continue metoprolol.  Continue rosuvastatin,  Eliquis discontinued.    Hypernatremia: Na trended up to 152. -Continue D5 infusion with concurrent use of diuretics-Lasix and Aldactone.  Obstructive sleep apnea on CPAP: No hypercapnia on VBG. -Continue CPAP nightly if able to tolerate.  Leukocytosis/bandemia: Remains elevated despite IV ceftriaxone.  Due to subdural hematoma? -Infectious workup as above. -Broad-spectrum antibiotics as above  Goal of care discussion: Remains DNR/DNI.  See goal of care discussion on 04/15/2022. Talked to patient's daughter and wife over the phone again on 12/30.  They like to exhaust every avenue before changing goal of care. -Palliative care consult is pending     DVT prophylaxis: SCDs due to intracranial bleed Code Status: DNR Family Communication: Updated patient's wife and daughter, Carmell [place] over the phone. Disposition Plan:   Status is: Inpatient Remains inpatient appropriate because: Delirium/acute metabolic encephalopathy and possible UTI  Final disposition: TBD   Consultants:  Neurosurgery-signed off Palliative medicine   Subjective: Seen and examined earlier this morning.  No acute events reported overnight.  No family present at time of my encounter.  Pt sleeping but restless in his sleep, intermittently grunting.    Objective: Vitals:   04/17/22 0017 04/17/22 0415 04/17/22 0752  04/17/22 1137  BP: (!) 116/50 (!) 114/52 114/68 121/68  Pulse: 71 71 74 80  Resp: 20  (!) 24 20  Temp: 99.9 F (37.7 C) 98.4 F (36.9 C) 99.1 F (37.3 C) 99.5 F (37.5 C)  TempSrc:  Axillary Oral   SpO2: 100% 97% 96% 93%  Weight:      Height:        Intake/Output Summary (Last 24 hours) at 04/17/2022 1324 Last data filed at 04/17/2022 0836 Gross per 24 hour  Intake 761.63 ml  Output 400 ml  Net 361.63 ml   Filed Weights   04/05/2022 1932 04/10/22 0500 04/14/22 0744  Weight: 94 kg 94.3 kg 94 kg    Examination:  General exam: appears sleeping, does not open eyes to voice or tactile stimulation, no acute distress HEENT: keeps eyes closed, NG tube in place, moist mucus membranes Respiratory system: CTAB, no wheezes, rales or rhonchi, normal respiratory effort. Cardiovascular system: normal S1/S2, RRR, no pedal edema.   Gastrointestinal system: soft, NT, ND, no HSM felt, +bowel sounds. Central nervous system: unable to assess due to somnolence, unresponsive & not following commands Extremities: moves all, no edema, normal tone Skin: dry, intact, normal temperature Psychiatry: unable to assess due to somnolence     Data Reviewed:  Notable labs ---  CMP with Na 153, Cl 122, glucose 151, BUN 40, Cr 1.40 from 1.27, Ca 8.3 (albumin 2.2), Mg 2.6, alk phos 142, ast 99, total protein 6.2, Tbili 1.4. BNP 490.1 CK normal 94  U/S bilateral lower extremities -- negative for DVT bilaterally.    LOS: 13 days     Floyce Stakes  Arbutus Ped, DO [facility]   If 7PM-7AM, please contact night-coverage Creatinine is stable.

## 2022-04-17 NOTE — Progress Notes (Signed)
Morning medications held due to patient not being alert to swallow medications. Not following commands.

## 2022-04-17 NOTE — Progress Notes (Signed)
Initial Nutrition Assessment RD working remotely.  DOCUMENTATION CODES:   Obesity unspecified  INTERVENTION:  - recommend repeat abdominal x-ray prior to TF initiation.  - ordered Osmolite 1.5 @ 20 ml/hr to advance by 10 ml every 12 hours to reach goal rate of 50 ml/hr with 60 ml Prosource TF20 once/day and 100 ml water every 4 hours.  - at goal rate, this regimen will provide 1880 kcal, 95 grams protein, and 1514 ml water.  - complete NFPE when feasible.  Monitor magnesium, potassium, and phosphorus BID for at least 3 days, MD to replete as needed, as pt is at risk for refeeding syndrome given poor oral intake throughout admission.   NUTRITION DIAGNOSIS:   Inadequate oral intake related to lethargy/confusion as evidenced by meal completion < 25%.  GOAL:   Patient will meet greater than or equal to 90% of their needs  MONITOR:   PO intake, TF tolerance, Labs, Weight trends  REASON FOR ASSESSMENT:   Consult Enteral/tube feeding initiation and management  ASSESSMENT:   86 year old male with medical history of dementia, CAD s/p CABG, HTN, sleep apnea, paroxysmal A-fib on Eliquis, Parkinson's dementia, OSA on CPAP, HFpEF, depression, GERD, HLD, lumbar and cervical DDD, basal and squamous cell carcinoma, and frequent falls. He presented to the ED due to increased confusion and agitation that has been ongoing but worse over the past week.  Wife reports patient has been falling and last fell 2 days ago, hitting his head. CT head showed an acute subdural hemorrhage with minimal left-to-right midline shift.  Patient was evaluated by Neurosurgeon who stated patient is not a surgical candidate given his dementia and frailty. Repeat CT head on 12/19 showed stable subdural hematoma but slight progression on CT head on 12/29. Patient was started on high-dose thiamine. Family interested in exploring all options before changing [place]; including interest in tube feeding. Palliative Care has been  consulted.  Patient noted to be disoriented x4. NGT placed in L nare on 12/30 (tip below the diaphragm per abdominal x-ray on 12/30). Nepro shake ordered TID starting 12/20 and patient was noted to have accepted all bottles from 12/23 morning-12/31 evening. This morning's bottle not offered d/t patient not being alert enough to swallow.  Patient has not been assessed by a [place] RD since 05/25/21.  Weight on 12/18, 12/24, and 12/28 was 207 lb. Weight on 02/04/22 was 217 lb. This indicates 10 lb (4.6%) weight loss in 2 months; not significant for time frame.   Labs reviewed; CBGs: 126, 157, 130 mg/dl, Na: 153 mmol/l, Cl: 122 mmol/l, BUN: 40 mg/dl, creatinine: 1.4 mg/dl, Ca: 8.3 mg/dl, Mg: 2.6 mg/dl, Alk Phos elevated, GFR: 50 ml/min.  Medications reviewed; 20 mg oral pepcid BID, 40 mg IV lasix x1 dose 12/30, sliding scale novolog, 50 mcg oral synthroid/day, 17 g miralax/day, 1 tablet senokot BID, 25 mg aldactone/day, 500 mg IV thiamine every 8 hours for 3 doses starting 12/30 evening followed by 250 mg IV thiamine TID for 3 doses followed by 100 mg IV or oral thiamine/day  IVF; D5 @ 100 ml/hr (408 kcal/24 hrs).    NUTRITION - FOCUSED PHYSICAL EXAM:  RD working remotely.  Diet Order:   Diet Order             DIET - DYS 1 Room service appropriate? No; Fluid consistency: Thin  Diet effective now                   EDUCATION NEEDS:   No  education needs have been identified at this time  Skin:  Skin Assessment: Reviewed RN Assessment  Last BM:  12/25 (type 4, medium amount)  Height:   Ht Readings from Last 1 Encounters:  04/17/2022 _0  (1.702 m)    Weight:   Wt Readings from Last 1 Encounters:  04/14/22 94 kg    BMI:  Body mass index is 32.47 kg/m.  Estimated Nutritional Needs:  Kcal:  1800-2000 kcal Protein:  90-100 grams Fluid:  >/= 1.8 L/day     Andre Matin, MS, RD, LDN, CNSC Clinical Dietitian PRN/Relief staff On-call/weekend pager # available in  [facility]

## 2022-04-18 ENCOUNTER — Inpatient Hospital Stay: Payer: HMO

## 2022-04-18 DIAGNOSIS — Z7189 Other specified counseling: Secondary | ICD-10-CM | POA: Diagnosis not present

## 2022-04-18 DIAGNOSIS — S065XAA Traumatic subdural hemorrhage with loss of consciousness status unknown, initial encounter: Secondary | ICD-10-CM | POA: Diagnosis not present

## 2022-04-18 DIAGNOSIS — G20A1 Parkinson's disease without dyskinesia, without mention of fluctuations: Secondary | ICD-10-CM | POA: Diagnosis not present

## 2022-04-18 DIAGNOSIS — 419620001 Death: Secondary | SNOMED CT | POA: Diagnosis not present

## 2022-04-18 DIAGNOSIS — F0280 Dementia in other diseases classified elsewhere without behavioral disturbance: Secondary | ICD-10-CM | POA: Diagnosis not present

## 2022-04-18 DIAGNOSIS — I5032 Chronic diastolic (congestive) heart failure: Secondary | ICD-10-CM | POA: Diagnosis not present

## 2022-04-18 LAB — COMPREHENSIVE METABOLIC PANEL
ALT: 44 U/L (ref 0–44)
AST: 62 U/L — ABNORMAL HIGH (ref 15–41)
Albumin: 2.1 g/dL — ABNORMAL LOW (ref 3.5–5.0)
Alkaline Phosphatase: 130 U/L — ABNORMAL HIGH (ref 38–126)
Anion gap: 12 (ref 5–15)
BUN: 55 mg/dL — ABNORMAL HIGH (ref 8–23)
CO2: 21 mmol/L — ABNORMAL LOW (ref 22–32)
Calcium: 8 mg/dL — ABNORMAL LOW (ref 8.9–10.3)
Chloride: 122 mmol/L — ABNORMAL HIGH (ref 98–111)
Creatinine, Ser: 1.85 mg/dL — ABNORMAL HIGH (ref 0.61–1.24)
GFR, Estimated: 35 mL/min — ABNORMAL LOW (ref >60)
Glucose, Bld: 161 mg/dL — ABNORMAL HIGH (ref 70–99)
Potassium: 3.6 mmol/L (ref 3.5–5.1)
Sodium: 155 mmol/L — ABNORMAL HIGH (ref 135–145)
Total Bilirubin: 1.7 mg/dL — ABNORMAL HIGH (ref 0.3–1.2)
Total Protein: 6.4 g/dL — ABNORMAL LOW (ref 6.5–8.1)

## 2022-04-18 LAB — BLOOD GAS, ARTERIAL
Acid-Base Excess: 1.1 mmol/L (ref 0.0–2.0)
Acid-Base Excess: 1.6 mmol/L (ref 0.0–2.0)
Bicarbonate: 22.6 mmol/L (ref 20.0–28.0)
Bicarbonate: 22.7 mmol/L (ref 20.0–28.0)
Delivery systems: POSITIVE
Delivery systems: POSITIVE
O2 Content: 2 L/min
O2 Content: 4 L/min
O2 Saturation: 94 %
O2 Saturation: 95.5 %
Patient temperature: 37
Patient temperature: 37
pCO2 arterial: 27 mmHg — ABNORMAL LOW (ref 32–48)
pCO2 arterial: 26 mmHg — ABNORMAL LOW (ref 32–48)
pH, Arterial: 7.53 — ABNORMAL HIGH (ref 7.35–7.45)
pH, Arterial: 7.55 — ABNORMAL HIGH (ref 7.35–7.45)
pO2, Arterial: 67 mmHg — ABNORMAL LOW (ref 83–108)
pO2, Arterial: 75 mmHg — ABNORMAL LOW (ref 83–108)

## 2022-04-18 LAB — MAGNESIUM
Magnesium: 2.8 mg/dL — ABNORMAL HIGH (ref 1.7–2.4)
Magnesium: 2.8 mg/dL — ABNORMAL HIGH (ref 1.7–2.4)

## 2022-04-18 LAB — GLUCOSE, CAPILLARY
Glucose-Capillary: 142 mg/dL — ABNORMAL HIGH (ref 70–99)
Glucose-Capillary: 155 mg/dL — ABNORMAL HIGH (ref 70–99)
Glucose-Capillary: 157 mg/dL — ABNORMAL HIGH (ref 70–99)
Glucose-Capillary: 159 mg/dL — ABNORMAL HIGH (ref 70–99)
Glucose-Capillary: 169 mg/dL — ABNORMAL HIGH (ref 70–99)
Glucose-Capillary: 191 mg/dL — ABNORMAL HIGH (ref 70–99)

## 2022-04-18 LAB — CBC
HCT: 46.6 % (ref 39.0–52.0)
Hemoglobin: 14.5 g/dL (ref 13.0–17.0)
MCH: 29.2 pg (ref 26.0–34.0)
MCHC: 31.1 g/dL (ref 30.0–36.0)
MCV: 94 fL (ref 80.0–100.0)
Platelets: 228 10*3/uL (ref 150–400)
RBC: 4.96 MIL/uL (ref 4.22–5.81)
RDW: 14.6 % (ref 11.5–15.5)
WBC: 26.7 10*3/uL — ABNORMAL HIGH (ref 4.0–10.5)
nRBC: 0.1 % (ref 0.0–0.2)

## 2022-04-18 LAB — PHOSPHORUS
Phosphorus: 4.6 mg/dL (ref 2.5–4.6)
Phosphorus: 3.8 mg/dL (ref 2.5–4.6)

## 2022-04-18 MED ORDER — FUROSEMIDE 10 MG/ML IJ SOLN
20.0000 mg | Freq: Once | INTRAMUSCULAR | Status: AC
  Administered 2022-04-18: 20 mg via INTRAVENOUS
  Filled 2022-04-18: qty 2

## 2022-04-18 MED ORDER — FAMOTIDINE 20 MG PO TABS
20.0000 mg | ORAL_TABLET | Freq: Every day | ORAL | Status: DC
  Administered 2022-04-19 – 2022-04-20 (×2): 20 mg via ORAL
  Filled 2022-04-18 (×2): qty 1

## 2022-04-18 MED ORDER — FUROSEMIDE 10 MG/ML IJ SOLN
40.0000 mg | Freq: Once | INTRAMUSCULAR | Status: AC
  Administered 2022-04-18: 40 mg via INTRAVENOUS
  Filled 2022-04-18: qty 4

## 2022-04-18 MED ORDER — VANCOMYCIN VARIABLE DOSE PER UNSTABLE RENAL FUNCTION (PHARMACIST DOSING): Status: DC

## 2022-04-18 NOTE — Consult Note (Addendum)
Consultation Note Date: 04/18/2022   Patient Name: Andre [name]  DOB: May 05, 1935  MRN: [id number]  Age / Sex: 87 y.o., male  PCP: [name], MD Referring Physician: Ezekiel Slocumb, DO  Reason for Consultation: Establishing goals of care  HPI/Patient Profile: 87 year old M with PMH of dementia, CAD s/p CABG, paroxysmal A-fib on Eliquis, Parkinson's dementia, OSA on CPAP, HFpEF and frequent falls who was brought to the ED with concerns for increased confusion and agitation that has been ongoing but worse over the past week.  Wife reports patient has been falling and last fell 2 days ago,  hitting his head.  CT head showed an acute subdural hemorrhage with minimal left-to-right midline shift.  Patient was evaluated by neurosurgery Dr. Cari Caraway who is stated patient is not a surgical candidate given his dementia and frailty.  Patient was given Kcentra for Eliquis reversal.  Patient is started on Cardene drip for hypertension.  Repeat CT head on 12/19 showed stable subdural hematoma but slight progression on CT head on 12/29.     Clinical Assessment and Goals of Care: Notes and labs reviewed.  In to see patient.  He is currently resting in bed with mittens in place, in addition to BiPAP.  He appears restless and is reaching up to the ceiling with both hands.  He does not speak.  Sitter is in place at bedside.  No family at bedside.  Called to speak to patient's wife.  She states that she is at home with a nerve issue in her back and is unsure if she would be able to drive. She states her daughter will not be back in town to bring her to the hospital until tomorrow.   She states she is aware of her husband's status, and has been updated.  She states that he has been doing pretty well physically in regards to his Parkinson's and ability to ambulate.  She discusses his dementia.  We discussed his diagnoses,  poor prognosis, GOC, EOL wishes disposition and options.  Created space and opportunity for patient  to explore thoughts and feelings regarding current medical information.   A detailed discussion was had today regarding advanced directives.  Concepts specific to code status, artifical feeding and hydration, IV antibiotics and rehospitalization were discussed.  The difference between an aggressive medical intervention path and a comfort care path was discussed.  Values and goals of care important to patient and family were attempted to be elicited.  Discussed limitations of medical interventions to prolong quality of life in some situations and discussed the concept of human mortality.  Discussed considering what patient would find as acceptable versus unacceptable pain and suffering.     She states hospice has been discussed with her that she is unsure at this time, and wants to speak with her family first. [name] states that she will likely not come until tomorrow when daughter is able to bring her.  Discussed that I would reach back out to her tomorrow morning.  SUMMARY OF RECOMMENDATIONS   PMT to follow-up tomorrow.      Primary Diagnoses: Present on Admission:  Subdural hematoma ([place])  Delirium due to multiple etiologies, acute, hyperactive  Slurred speech  Parkinson's disease  Coronary artery disease involving native coronary artery of native heart with angina pectoris ([place])  Essential hypertension  COPD (chronic obstructive pulmonary disease) (HCC)  Dementia associated with Parkinson's disease ([place])  Thrombocytopenia ([place])  PAF (paroxysmal atrial fibrillation) ([place])  Hypothyroidism   I have reviewed the medical record, interviewed the patient and family, and examined the patient. The following aspects are pertinent.  Past Medical History:  Diagnosis Date   Atherosclerosis of abdominal aorta ([place])    Basal cell carcinoma 03/04/2008   Right nose supratip.     CAD (coronary artery disease)    CABG 1998   Cervical spondylosis 10/01/2013   Chronic diastolic CHF (congestive heart failure) ([place])    a. 07/2016 Echo: >55%; b. 10/2016 Echo: EF 55-60%, Gr1 DD, Ao sclerosis w/o stenosis, sev dil LA; c. 08/2017 Echo: EF 60-65%, no rwma, Gr2 DD, mild AS, sev dil LA/RA.   Coronary artery disease    a. 1998 s/p mini-cabg @ [facility] - LIMA->LAD;  b. 07/2016 St Echo: Inadequate HR w/ HTN response;  c.  08/2016 MV: EF 67%, no ischemia; d. 10/2016 NSTEMI/Cath: RCA 95p (4.0x26 Onyx DES), LIMA->LAD nl; e. 09/2017 Cath: LM 40/30, LAD 100ost, RI 80, LCX nl, OM2/3 nl, RCA patent stent, 18m LIMA->LAD nl-->Med Rx.   DDD (degenerative disc disease), cervical    DDD (degenerative disc disease), lumbar    Dementia with parkinsonism ([place] 06/03/2020   Depression    Gait abnormality 07/31/2019   GERD (gastroesophageal reflux disease)    History of SCC (squamous cell carcinoma) of skin 07/27/2020   right forearm / EDC   Hyperlipidemia    Hypertension    Hypothyroidism    PAF (paroxysmal atrial fibrillation) ([place]    a. s/p DCCV-->maintaining sinus on amiodarone;  b. CHA2DS2VASc = 5-->eliquis.   Parkinson's disease    tremors   Pleural effusion, right    a. 09/2017 s/p thoracentesis.   PNA (pneumonia) 08/26/2017   Pulmonary embolism ([place] 2011   Pulmonary fibrosis ([place]    Secondary erythrocytosis 01/28/2015   Sleep apnea    wears CPAP   Squamous cell carcinoma of skin 03/19/2015   Right lateral crown. KA-like pattern   Thrombocytopenia ([place]    Social History   Socioeconomic History   Marital status: Married    Spouse name: PMardene Celeste  Number of children: 1   Years of education: 12   Highest education level: Not on file  Occupational History   Occupation: Retired    Comment: eDesigner, television/film set Tobacco Use   Smoking status: Former    Packs/day: 1.00    Years: 10.00    Total pack years: 10.00    Types: Cigarettes, Pipe, Cigars    Quit date: 04/18/1972    Years  since quitting: 50.0   Smokeless tobacco: Former    Types: Chew    Quit date: 04/18/1972  Vaping Use   Vaping Use: Never used  Substance and Sexual Activity   Alcohol use: Yes    Alcohol/week: 4.0 standard drinks of alcohol    Types: 2 Cans of beer, 2 Shots of liquor per week    Comment: per 2 weeks    Drug use: No   Sexual activity: Yes  Other Topics Concern   Not on file  Social  History Narrative   Lives w/ wife   Caffeine use: none   Right-handed   Social Determinants of Health   Financial Resource Strain: Low Risk  (09/14/2021)   Overall Financial Resource Strain (CARDIA)    Difficulty of Paying Living Expenses: Not hard at all  Food Insecurity: No Food Insecurity (04/06/2022)   Hunger Vital Sign    Worried About Running Out of Food in the Last Year: Never true    Ran Out of Food in the Last Year: Never true  Transportation Needs: No Transportation Needs (04/06/2022)   PRAPARE - Hydrologist (Medical): No    Lack of Transportation (Non-Medical): No  Physical Activity: Unknown (12/29/2017)   Exercise Vital Sign    Days of Exercise per Week: 0 days    Minutes of Exercise per Session: Not on file  Stress: No Stress Concern Present (09/14/2021)   [place]    Feeling of Stress : Not at all  Social Connections: Unknown (09/14/2021)   Social Connection and Isolation Panel [NHANES]    Frequency of Communication with Friends and Family: Not on file    Frequency of Social Gatherings with Friends and Family: Not on file    Attends Religious Services: Not on file    Active Member of Clubs or Organizations: Not on file    Attends Archivist Meetings: Not on file    Marital Status: Married   Family History  Problem Relation Age of Onset   Alcohol abuse Father    Parkinson's disease Neg Hx    Scheduled Meds:  amLODipine  5 mg Oral Daily   buPROPion  150 mg Oral BID    carbidopa-levodopa  0.5 tablet Oral QID   entacapone  200 mg Oral QID   [START ON 04/19/2022] famotidine  20 mg Oral QHS   feeding supplement (NEPRO CARB STEADY)  237 mL Oral TID BM   feeding supplement (PROSource TF20)  60 mL Per Tube Daily   insulin aspart  0-5 Units Subcutaneous QHS   insulin aspart  0-9 Units Subcutaneous TID WC   levothyroxine  50 mcg Oral Q0600   metoprolol tartrate  25 mg Oral BID   polyethylene glycol  17 g Oral Daily   rosuvastatin  10 mg Oral Daily   senna-docusate  1 tablet Oral BID   sertraline  50 mg Oral Daily   sodium chloride flush  3 mL Intravenous Q12H   spironolactone  25 mg Oral Daily   thiamine (VITAMIN B1) injection  100 mg Intravenous TID   Followed by   Derrill Memo ON 04/20/2022] thiamine (VITAMIN B1) injection  100 mg Intravenous Daily   umeclidinium bromide  1 puff Inhalation Daily   vancomycin variable dose per unstable renal function (pharmacist dosing)   Does not apply See admin instructions   Continuous Infusions:  sodium chloride Stopped (04/16/22 1848)   dextrose 75 mL/hr at 04/18/22 0543   feeding supplement (OSMOLITE 1.5 CAL) Stopped (04/18/22 0339)   piperacillin-tazobactam (ZOSYN)  IV 3.375 g (04/18/22 0900)   thiamine (VITAMIN B1) injection Stopped (04/18/22 1237)   PRN Meds:.sodium chloride, acetaminophen **OR** acetaminophen, albuterol, bisacodyl, LORazepam, ondansetron **OR** ondansetron (ZOFRAN) IV Medications Prior to Admission:  Prior to Admission medications   Medication Sig Start Date End Date Taking? Authorizing Provider  buPROPion (WELLBUTRIN XL) 300 MG 24 hr tablet TAKE 1 TABLET BY MOUTH DAILY 09/28/21  Yes [name], MD  carbidopa-levodopa (  SINEMET IR) 25-250 MG tablet Take 0.5 tablets by mouth 4 (four) times daily. 01/24/22  Yes Penumalli, Earlean Polka, MD  ELIQUIS 5 MG TABS tablet TAKE ONE TABLET BY MOUTH TWICE DAILY 11/09/21  Yes Wellington Hampshire, MD  entacapone (COMTAN) 200 MG tablet Take 1 tablet (200 mg total) by  mouth 3 (three) times daily. 01/24/22  Yes Penumalli, Earlean Polka, MD  esomeprazole (NEXIUM) 40 MG capsule Take 1 capsule (40 mg total) by mouth daily. 02/03/22  Yes [name], MD  fluticasone (FLONASE) 50 MCG/ACT nasal spray USE 2 PUFFS IN EACH NOSTRIL DAILY Patient taking differently: Place 2 sprays into both nostrils daily. 03/09/21  Yes [name], MD  furosemide (LASIX) 40 MG tablet TAKE 1 TABLET BY MOUTH DAILY 11/15/21  Yes [name], MD  levothyroxine (SYNTHROID) 50 MCG tablet Take 1 tablet (50 mcg total) by mouth daily before breakfast. TAKE ONE TABLET ON AN EMPTY STOMACH WITH A GLASS OF WATER AT LEAST 30 TO 60 MINUTES BEFORE BREAKFAST. 02/03/22  Yes [name], MD  metoprolol tartrate (LOPRESSOR) 25 MG tablet TAKE ONE TABLET BY MOUTH TWICE DAILY 02/28/22  Yes [name], MD  mirabegron ER (MYRBETRIQ) 25 MG TB24 tablet Take 1 tablet (25 mg total) by mouth daily. 10/11/21  Yes Stoioff, Ronda Fairly, MD  rosuvastatin (CRESTOR) 10 MG tablet Take 1 tablet (10 mg total) by mouth daily. 01/26/22  Yes [name], MD  sertraline (ZOLOFT) 50 MG tablet TAKE 1 TABLET BY MOUTH DAILY 11/26/21  Yes [name], MD  STIOLTO RESPIMAT 2.5-2.5 MCG/ACT AERS  06/18/21  Yes [provider]  albuterol (VENTOLIN HFA) 108 (90 Base) MCG/ACT inhaler Inhale 2 puffs into the lungs every 6 (six) hours as needed for wheezing or shortness of breath. 04/08/21   Mannam, Hart Robinsons, MD  feeding supplement (ENSURE ENLIVE / ENSURE PLUS) LIQD Take 237 mLs by mouth 3 (three) times daily between meals. 05/07/20   Edwin Dada, MD  hydrocortisone 2.5 % lotion For seborrheic dermatitis of the face apply to aa's QAM on Tuesday, Thursday, and Saturday. Patient not taking: Reported on 04/06/2022 11/05/20   Ralene Bathe, MD  ketoconazole (NIZORAL) 2 % cream Apply a thin coat to face QAM on Monday, Wednesday, and Friday. Patient not taking: Reported on 03/24/2022 11/05/20    Ralene Bathe, MD  ketoconazole (NIZORAL) 2 % shampoo Shampoo into the face and scalp let sit 5 minutes then wash off. Use 3d/wk. Patient not taking: Reported on 04/17/2022 11/05/20   Ralene Bathe, MD  lactulose (Christmas) 10 GM/15ML solution TAKE 30 MLS BY MOUTH TWICE DAILY AS NEEDED FOR MODERATE CONSTIPATION Patient not taking: Reported on 03/19/2022 06/09/21   [name], MD  losartan (COZAAR) 100 MG tablet TAKE 1 TABLET BY MOUTH DAILY Patient not taking: Reported on 03/27/2022 02/24/22   [name], MD   Allergies  Allergen Reactions   Pravastatin Other (See Comments)   Prednisone Other (See Comments)    Pt states that med makes him hyper Pt states that med makes him hyper   Review of Systems  Unable to perform ROS   Physical Exam Constitutional:      Comments: Eyes closed  Pulmonary:     Comments: On BiPAP Psychiatric:     Comments: Mittens in place     Vital Signs: BP (!) 104/55 (BP Location: Left Arm)   Pulse 72   Temp 99.1 F (37.3 C) (Axillary)  Resp (!) 36   Ht '5\' 7"'$  (1.702 m)   Wt 90.1 kg   SpO2 95%   BMI 31.11 kg/m  Pain Scale: 0-10   Pain Score: 0-No pain   SpO2: SpO2: 95 % O2 Device:SpO2: 95 % O2 Flow Rate: .   IO: Intake/output summary:  Intake/Output Summary (Last 24 hours) at 04/18/2022 1442 Last data filed at 04/18/2022 1438 Gross per 24 hour  Intake 797.6 ml  Output 1175 ml  Net -377.4 ml    LBM: Last BM Date : 04/17/22 Baseline Weight: Weight: 94 kg Most recent weight: Weight: 90.1 kg       Signed by: Asencion Gowda, NP   Please contact Palliative Medicine Team phone at [phone] for questions and concerns.  For individual provider: See Shea Evans

## 2022-04-18 NOTE — Progress Notes (Signed)
       CROSS COVER NOTE  NAME: BERK PILOT MRN: [id number] DOB : 1935/06/19 ATTENDING PHYSICIAN: Ezekiel Slocumb, DO    Date of Service   04/18/2022   HPI/Events of Note   Message received from nursing staff reporting Increased work of breathing and RR 32.   On bedside evaluation Mr Granlund is responsive to voice though he is not interactive. He has rhonchorous lung sounds and increased work of breathing.  Interventions   Assessment/Plan:  CXR- pulmonary vascular congestion and interstitial edema ABG- pH 7.53 pCO2 27 pO2 67 Bicarb 22.6 O2 Sat 94 Bipap with Sitter ordered, patient altered unable to remove Bipap himself, confirmed with staff patient has a sitter assigned  Hold Tube Feeds 40 mg IV Lasix D5W decreased to 75 mL/hr (continued despite pulmonary edema due to stopping tube feeds while on Bipap)       To reach the provider On-Call:   7AM- 7PM see care teams to locate the attending and reach out to them via [email or web address]. 7PM-7AM contact night-coverage If you still have difficulty reaching the appropriate provider, please page the [facility] (Director on Call) for [facility] on amion for assistance  This document was prepared using Set designer software and may include unintentional dictation errors.  Neomia Glass DNP, MBA, FNP-BC Nurse Practitioner [facility] Pager [phone]

## 2022-04-18 NOTE — Consult Note (Signed)
Pharmacy Antibiotic Note  Andre [name] is a 87 y.o. male with PMH including CAD s/p CABG, Afib on Eliquis, Parkinson's dementia, OSA on CPAP, HFpEF admitted on 03/31/2022 with  delirium / AMS / subdural hematoma in setting of anticoagulation and recurrent falls .  Pharmacy has been consulted for Zosyn and vancomycin dosing.  Patient previously on ceftriaxone and received 5 days of therapy. Despite antimicrobial therapy, remained intermittently febrile and leukocytosis persisted.  Plan:  Zosyn 3.375 g IV q8h (4-hr infusion)  Discontinue scheduled vancomycin given AKI --Will check random vancomycin level with AM labs tomorrow --Daily Scr per protocol  Continue to monitor renal function closely given worsening Scr and vancomycin & Zosyn combination  Height: '5\' 7"'$  (170.2 cm) Weight: 90.1 kg (198 lb 10.2 oz) IBW/kg (Calculated) : 66.1  Temp (24hrs), Avg:99.2 F (37.3 C), Min:98.2 F (36.8 C), Max:100 F (37.8 C)  Recent Labs  Lab 04/13/22 0629 04/14/22 0400 04/15/22 0735 04/16/22 0617 04/16/22 1420 04/17/22 0431 04/17/22 1715 04/18/22 0552  WBC 25.3* 21.0* 22.1* 22.7*  --   --   --  26.7*  CREATININE 1.25* 1.18 1.28* 1.30* 1.27* 1.40*  --  1.85*  VANCORANDOM  --   --   --   --   --   --  10  --      Estimated Creatinine Clearance: 31.8 mL/min (A) (by C-G formula based on SCr of 1.85 mg/dL (H)).    Allergies  Allergen Reactions   Pravastatin Other (See Comments)   Prednisone Other (See Comments)    Pt states that med makes him hyper Pt states that med makes him hyper    Antimicrobials this admission: Ceftriaxone 12/26 >> 12/30 Zosyn 12/30 >>  Vancomycin 12/30 >>   Dose adjustments this admission: N/A  Microbiology results: 12/30 BCx: NGTD 12/30 UCx: NG 12/30 MRSA PCR: (-) 12/30 RVP: (-)  Thank you for allowing pharmacy to be a part of this patient's care.  Benita Gutter 04/18/2022 2:11 PM

## 2022-04-18 NOTE — Progress Notes (Addendum)
PROGRESS NOTE    Andre [name]  KDT:[id number] DOB: December 04, 1935 DOA: 03/31/2022 PCP: [name], MD    Brief Narrative:  87 year old M with PMH of dementia, CAD s/p CABG, paroxysmal A-fib on Eliquis, Parkinson's dementia, OSA on CPAP, HFpEF and frequent falls who was brought to the ED with concerns for increased confusion and agitation that has been ongoing but worse over the past week.  Wife reports patient has been falling and last fell 2 days ago,  hitting his head.  CT head showed an acute subdural hemorrhage with minimal left-to-right midline shift.  Patient was evaluated by neurosurgery Dr. Cari Caraway who is stated patient is not a surgical candidate given his dementia and frailty.  Patient was given Kcentra for Eliquis reversal.  Patient is started on Cardene drip for hypertension.  Repeat CT head on 12/19 showed stable subdural hematoma but slight progression on CT head on 12/29.    Patient remains confused and delirious.  Some concern about UTI and started on IV ceftriaxone but urine culture was not collected.  Mental status did not improve with antibiotics.  He has persistent leukocytosis.  Extensive goals of care discussion with patient's daughter and wife at bedside on 12/29.  Family wanted to exhaust every avenue before changing goal of care.  Order CT chest/abdomen/pelvis, blood culture, urine culture, and broadened antibiotics.  Family also likes to try tube feeding.  Started high-dose thiamine as well.  Palliative medicine consulted for ongoing goals of care discussions.   Assessment & Plan:   Principal Problem:   Subdural hematoma (HCC) Active Problems:   OSA on CPAP   PAF (paroxysmal atrial fibrillation) (HCC)   Thrombocytopenia (HCC)   Parkinson's disease   Coronary artery disease involving native coronary artery of native heart with angina pectoris (HCC)   Goals of care, counseling/discussion   Essential hypertension   Hypothyroidism   COPD (chronic obstructive  pulmonary disease) (HCC)   Slurred speech   Delirium due to multiple etiologies, acute, hyperactive   Dementia associated with Parkinson's disease ([place])   Frequent falls   Chronic anticoagulation   Hypertensive urgency   Chronic diastolic CHF (congestive heart failure) (HCC)   SDH (subdural hematoma) ([place])   DNR (do not resuscitate)  Delirium/acute metabolic encephalopathy/slurred speech in patient with Parkinson's dementia: patient is totally disoriented without focal neurodeficit.  He has significant leukocytosis and mild fever.  UA concerning for UTI.  Full RVP, COVID-19, influenza and RSV PCR nonreactive. TSH, B12, ammonia and VBG unrevealing.  CT head with relatively stable subdural hematoma.  No meningeal signs.  No improvement in mental status with IV ceftriaxone.  He has persistent leukocytosis.  He is getting IV Ativan intermittently which could contribute.  Not a candidate for antipsychotics due to Parkinson's. -Goal of care discussion on 04/15/2022 -High-dose thiamine, urine culture, blood culture, CT chest/abdomen/pelvis -Escalated antibiotics to vancomycin and Zosyn -Stop Vancomycin with negative MRSA and worsening renal fx -NG tube placed  -Initiated tube feeds 12/31, but had to be stopped for bipap overnight -Delirium, fall and aspiration precautions -Continue Sinemet and Comtan. -Monitor urine output closely. -Scheduled bowel regimen possible constipation -Palliative care consult pending.  Subdural hematoma in the setting of chronic anticoagulation and recurrent falls.  Received Kcentra for Eliquis reversal.  Eliquis discontinued.  Repeat CT on 12/19 without significant change of SDH.  Neurosurgery consulted and did not feel patient is a surgical candidate for resection of subdural hematoma given his underlying dementia, and recommended conservative care.  Repeat CT  head on 12/29 with slight progression -Continue PT/OT as able  Acute on chronic Diastolic CHF: Patient was on  IV fluid due to dehydration and poor p.o. intake.  Developed some wheezing and shortness of breath.  CXR concerning for cardiomegaly and pulmonary edema. BNP slightly  -Lasix given overnight for increased work of breathing and repeat CXR with pulmonary vascular congestion. Remain on D5w fluids for worsening hypernatremia, may need further diuresis but renal function is worsening.   -Bipap PRN for work of breathing -Previously given IV Lasix x 1 -Given hypernatremia, add Aldactone 25 mg daily -Closely monitor respiratory and fluid status while on D5w -Continue metoprolol.  Dysphagia -Continue dysphagia 1 diet per SLP. -Initiate tube feeds -Aspiration precaution    Hypertensive urgency: BP within acceptable range. -Cardiac meds as above   Chronic COPD: Stable. -Continue Stiolto Respimat with as needed albuterol.   History of CAD: Troponin negative.  EKG without acute ischemic finding. -Continue metoprolol.  Continue rosuvastatin,  Eliquis discontinued.    Hypernatremia: Na trended up to 152. -Continue D5 infusion with concurrent use of diuretics-Lasix and Aldactone.  Obstructive sleep apnea on CPAP: No hypercapnia on VBG. -Currently on Bipap -Continue CPAP nightly if able to tolerate.  Leukocytosis/bandemia: Remains elevated despite IV ceftriaxone.  Due to subdural hematoma? -Infectious workup as above. -Broad-spectrum antibiotics as above  Goal of care discussion: Remains DNR/DNI.  See goal of care discussion on 04/15/2022. Talked to patient's daughter and wife over the phone again on 12/30.  They like to exhaust every avenue before changing goal of care. -Palliative care consult is pending     DVT prophylaxis: SCDs due to intracranial bleed  Code Status: DNR  Family Communication: Dr. Cyndia Skeeters last updated patient's wife and daughter, Carmell [place] over the phone.    Disposition Plan:   Status is: Inpatient Remains inpatient appropriate because: Multi-system organ failure,  remains persistently encephalopathic, needing IV therapies.    Patient is high risk for morbidity and mortality.  Prognosis is poor.  Final disposition: TBD   Consultants:  Neurosurgery-signed off Palliative medicine   Subjective: Pt started on bipap overnight and given IV lasix for worsening respiratory status.  Pt remains somnolent, non-responsive.  He does get restless with tactile stimulation but not able to interact otherwise. Sitter at bedside.  Objective: Vitals:   04/18/22 0741 04/18/22 0850 04/18/22 1110 04/18/22 1301  BP: 107/63 107/63 (!) 124/51 (!) 104/55  Pulse: 87 87 71 72  Resp: (!) 38 (!) 38 (!) 38 (!) 36  Temp: 100 F (37.8 C) 100 F (37.8 C) 99.1 F (37.3 C) 99.1 F (37.3 C)  TempSrc: Oral Axillary Oral Axillary  SpO2: 90% 100% 94% 95%  Weight:      Height:        Intake/Output Summary (Last 24 hours) at 04/18/2022 1438 Last data filed at 04/18/2022 0339 Gross per 24 hour  Intake 797.6 ml  Output 800 ml  Net -2.4 ml   Filed Weights   04/10/22 0500 04/14/22 0744 04/18/22 0500  Weight: 94.3 kg 94 kg 90.1 kg    Examination:  General exam: somnolent, becomes briefly restless with tactile stimulation, no acute distress HEENT: wearing bipap mask, eyes remain closed, NG tube in place, moist mucus membranes Respiratory system: decreased breath sounds at bases, increased respiratory effort with accessory muscle use, on Bipap. Cardiovascular system: normal S1/S2, RRR, no pedal edema.   Gastrointestinal system: soft, NT, ND Central nervous system: unable to assess due to somnolence, unresponsive & not following commands  Extremities: mittens on both hands, no edema, normal tone Skin: dry, intact, normal temperature Psychiatry: unable to assess due to somnolence     Data Reviewed:  Notable labs ---  CMP with Na 153>>155, Cl 122, glucose 161, BUN 40>>55, Cr 1.40 >> 1.85, Ca 8.0 (albumin 2.1), Mg 2.8, alk phos 130, ast 62, total protein 6.4, Tbili 1.7.  CBC  with WBC 26.7 up from 22.7   ABG overnight pH 7.55, pCO2 26, pO2 75, bicarb 22.7  Chest xray overnight --- IMPRESSION: Mild cardiomegaly with pulmonary vascular congestion and interstitial edema.  U/S bilateral lower extremities -- negative for DVT bilaterally.    LOS: 14 days     Ezekiel Slocumb, DO [facility]   If 7PM-7AM, please contact night-coverage Creatinine is stable.

## 2022-04-18 DEATH — deceased

## 2022-04-19 ENCOUNTER — Telehealth: Payer: Self-pay | Admitting: Family Medicine

## 2022-04-19 DIAGNOSIS — E440 Moderate protein-calorie malnutrition: Secondary | ICD-10-CM | POA: Insufficient documentation

## 2022-04-19 DIAGNOSIS — G20A1 Parkinson's disease without dyskinesia, without mention of fluctuations: Secondary | ICD-10-CM | POA: Diagnosis not present

## 2022-04-19 DIAGNOSIS — S065XAA Traumatic subdural hemorrhage with loss of consciousness status unknown, initial encounter: Secondary | ICD-10-CM | POA: Diagnosis not present

## 2022-04-19 DIAGNOSIS — F0280 Dementia in other diseases classified elsewhere without behavioral disturbance: Secondary | ICD-10-CM | POA: Diagnosis not present

## 2022-04-19 DIAGNOSIS — Z7189 Other specified counseling: Secondary | ICD-10-CM | POA: Diagnosis not present

## 2022-04-19 DIAGNOSIS — I5032 Chronic diastolic (congestive) heart failure: Secondary | ICD-10-CM | POA: Diagnosis not present

## 2022-04-19 LAB — MAGNESIUM
Magnesium: 2.7 mg/dL — ABNORMAL HIGH (ref 1.7–2.4)
Magnesium: 2.6 mg/dL — ABNORMAL HIGH (ref 1.7–2.4)

## 2022-04-19 LAB — BASIC METABOLIC PANEL
Anion gap: 12 (ref 5–15)
BUN: 50 mg/dL — ABNORMAL HIGH (ref 8–23)
CO2: 23 mmol/L (ref 22–32)
Calcium: 7.9 mg/dL — ABNORMAL LOW (ref 8.9–10.3)
Chloride: 121 mmol/L — ABNORMAL HIGH (ref 98–111)
Creatinine, Ser: 1.81 mg/dL — ABNORMAL HIGH (ref 0.61–1.24)
GFR, Estimated: 36 mL/min — ABNORMAL LOW (ref >60)
Glucose, Bld: 144 mg/dL — ABNORMAL HIGH (ref 70–99)
Potassium: 3.6 mmol/L (ref 3.5–5.1)
Sodium: 156 mmol/L — ABNORMAL HIGH (ref 135–145)

## 2022-04-19 LAB — CBC
HCT: 45.4 % (ref 39.0–52.0)
Hemoglobin: 14.1 g/dL (ref 13.0–17.0)
MCH: 29.4 pg (ref 26.0–34.0)
MCHC: 31.1 g/dL (ref 30.0–36.0)
MCV: 94.8 fL (ref 80.0–100.0)
Platelets: 211 10*3/uL (ref 150–400)
RBC: 4.79 MIL/uL (ref 4.22–5.81)
RDW: 14.7 % (ref 11.5–15.5)
WBC: 23.9 10*3/uL — ABNORMAL HIGH (ref 4.0–10.5)
nRBC: 0 % (ref 0.0–0.2)

## 2022-04-19 LAB — PHOSPHORUS
Phosphorus: 4 mg/dL (ref 2.5–4.6)
Phosphorus: 4.1 mg/dL (ref 2.5–4.6)

## 2022-04-19 LAB — GLUCOSE, CAPILLARY
Glucose-Capillary: 167 mg/dL — ABNORMAL HIGH (ref 70–99)
Glucose-Capillary: 175 mg/dL — ABNORMAL HIGH (ref 70–99)
Glucose-Capillary: 134 mg/dL — ABNORMAL HIGH (ref 70–99)
Glucose-Capillary: 159 mg/dL — ABNORMAL HIGH (ref 70–99)

## 2022-04-19 MED ORDER — DIAZEPAM 5 MG/ML IJ SOLN
2.5000 mg | INTRAMUSCULAR | Status: DC | PRN
  Administered 2022-04-19: 5 mg via INTRAVENOUS
  Filled 2022-04-19: qty 2

## 2022-04-19 MED ORDER — MORPHINE SULFATE (PF) 2 MG/ML IV SOLN
2.0000 mg | INTRAVENOUS | Status: DC | PRN
  Administered 2022-04-19 – 2022-04-20 (×2): 2 mg via INTRAVENOUS
  Filled 2022-04-19 (×2): qty 1

## 2022-04-19 NOTE — TOC Progression Note (Signed)
Transition of Care [facility]) - Progression Note    Patient Details  Name: Andre [name] MRN: [id number] Date of Birth: 06/28/1935  Transition [facility]) CM/SW Contact  Laurena Slimmer, RN Phone Number: 04/19/2022, 11:24 AM  Clinical Narrative:    Per TOC note 12/28 patient is not SNF appropriate. Per payor geripsych or custodial care would be appropriate. Disposition contingent on family decision for hospice care. Family will visit today and decision will be revaluated 04/20/2022 per palliative note.   Expected Discharge Plan: [place] Barriers to Discharge: Continued Medical Work up  Expected Discharge Plan and Services   Discharge Planning Services: CM Consult Post Acute Care Choice: [place] Living arrangements for the past 2 months: Single Family Home                                       Social Determinants of Health (SDOH) Interventions SDOH Screenings   Food Insecurity: No Food Insecurity (04/06/2022)  Housing: Low Risk  (04/06/2022)  Transportation Needs: No Transportation Needs (04/06/2022)  Utilities: Not At Risk (04/06/2022)  Depression (PHQ2-9): Low Risk  (01/18/2022)  Financial Resource Strain: Low Risk  (09/14/2021)  Physical Activity: Unknown (12/29/2017)  Social Connections: Unknown (09/14/2021)  Stress: No Stress Concern Present (09/14/2021)  Tobacco Use: Medium Risk (04/10/2022)    Readmission Risk Interventions     No data to display

## 2022-04-19 NOTE — Progress Notes (Addendum)
PROGRESS NOTE    Andre [name]  BPZ:[id number] DOB: [birth year] DOA: 03/26/2022 PCP: [name], MD    Brief Narrative:  87 year old M with PMH of dementia, CAD s/p CABG, paroxysmal A-fib on Eliquis, Parkinson's dementia, OSA on CPAP, HFpEF and frequent falls who was brought to the ED with concerns for increased confusion and agitation that has been ongoing but worse over the past week.  Wife reports patient has been falling and last fell 2 days ago,  hitting his head.  CT head showed an acute subdural hemorrhage with minimal left-to-right midline shift.  Patient was evaluated by neurosurgery Dr. Cari Caraway who is stated patient is not a surgical candidate given his dementia and frailty.  Patient was given Kcentra for Eliquis reversal.  Patient is started on Cardene drip for hypertension.  Repeat CT head on 12/19 showed stable subdural hematoma but slight progression on CT head on 12/29.    Patient remains confused and delirious.  Some concern about UTI and started on IV ceftriaxone but urine culture was not collected.  Mental status did not improve with antibiotics.  He has persistent leukocytosis.  Extensive goals of care discussion with patient's daughter and wife at bedside on 12/29.  Family wanted to exhaust every avenue before changing goal of care.  Order CT chest/abdomen/pelvis, blood culture, urine culture, and broadened antibiotics.  Family also likes to try tube feeding.  Started high-dose thiamine as well.  Palliative medicine consulted for ongoing goals of care discussions.   Assessment & Plan:   Principal Problem:   Subdural hematoma (HCC) Active Problems:   OSA on CPAP   PAF (paroxysmal atrial fibrillation) (HCC)   Thrombocytopenia (HCC)   Parkinson's disease   Coronary artery disease involving native coronary artery of native heart with angina pectoris (HCC)   Goals of care, counseling/discussion   Essential hypertension   Hypothyroidism   COPD (chronic obstructive  pulmonary disease) (HCC)   Slurred speech   Delirium due to multiple etiologies, acute, hyperactive   Dementia associated with Parkinson's disease ([place])   Frequent falls   Chronic anticoagulation   Hypertensive urgency   Chronic diastolic CHF (congestive heart failure) (HCC)   SDH (subdural hematoma) ([place])   DNR (do not resuscitate)   Malnutrition of moderate degree  Delirium/acute metabolic encephalopathy/slurred speech in patient with Parkinson's dementia: patient is totally disoriented without focal neurodeficit.  He has significant leukocytosis and mild fever.  UA concerning for UTI.  Full RVP, COVID-19, influenza and RSV PCR nonreactive. TSH, B12, ammonia and VBG unrevealing.  CT head with relatively stable subdural hematoma.  No meningeal signs.  No improvement in mental status with IV ceftriaxone.  He has persistent leukocytosis.  He is getting IV Ativan intermittently which could contribute.  Not a candidate for antipsychotics due to Parkinson's. -Goal of care discussion on 04/15/2022 -High-dose thiamine, urine culture, blood culture, CT chest/abdomen/pelvis -Escalated antibiotics to vancomycin and Zosyn -Stop Vancomycin with negative MRSA and worsening renal fx -Will stop Zosyn also given no improvement and no evidence for bacterial infection.  -NG tube placed  -Initiated tube feeds 12/31, but had to be stopped for bipap overnight -Delirium, fall and aspiration precautions -Continue Sinemet and Comtan. -Monitor urine output closely. -Scheduled bowel regimen possible constipation -Palliative care consult pending.  Subdural hematoma in the setting of chronic anticoagulation and recurrent falls.  Received Kcentra for Eliquis reversal.  Eliquis discontinued.  Repeat CT on 12/19 without significant change of SDH.  Neurosurgery consulted and did not feel patient  is a surgical candidate for resection of subdural hematoma given his underlying dementia, and recommended conservative care.    Repeat CT head on 12/29 with slight progression -Goals of care discussions underway with family -Appropriate for comfort care measures  Acute on chronic Diastolic CHF: Patient was on IV fluid due to dehydration and poor p.o. intake.  Developed some wheezing and shortness of breath.  CXR concerning for cardiomegaly and pulmonary edema. BNP slightly  -Lasix given overnight 12/31-1/1 for increased work of breathing and repeat CXR with pulmonary vascular congestion. Remains on D5w fluids for worsening hypernatremia, may need further diuresis but renal function is worsening. -Bipap PRN for work of breathing -Given hypernatremia, added Aldactone 25 mg daily -Closely monitor respiratory and fluid status while on D5w -Continue metoprolol.  Leukocytosis/bandemia:  Due to subdural hematoma? He was started on broad spectrum antibiotics, but has not demonstrated any clinical improvement. -Infectious workup as above. -Stop antibiotics, no evidence of bacterial infection  Hypernatremia: Na trending up to 152>>155>>156. -Continue D5 infusion - increase rate slightly -Continue Aldactone -Intermittent IV Lasix as needed  Dysphagia -Continue dysphagia 1 diet per SLP. -Initiate tube feeds -Aspiration precaution    Hypertensive urgency: BP within acceptable range. -Cardiac meds as above   Chronic COPD: Stable. -Continue Stiolto Respimat with as needed albuterol.   History of CAD: Troponin negative.  EKG without acute ischemic finding. -Continue metoprolol.  Continue rosuvastatin,  Eliquis discontinued.    Obstructive sleep apnea on CPAP: No hypercapnia on VBG. -Currently on Bipap -Continue CPAP nightly if able to tolerate.  Goal of care discussion: Remains DNR/DNI.  See goal of care discussion on 04/15/2022. Talked to patient's daughter and wife over the phone again on 12/30.  They like to exhaust every avenue before changing goal of care. -Palliative care consulted -Family considering  comfort, expect decision soon, they will be here this afternoon to see patient    DVT prophylaxis: SCDs due to intracranial bleed  Code Status: DNR  Family Communication: Dr. Cyndia Skeeters last updated patient's wife and daughter, Carmell [place] over the phone.    Disposition Plan:   Status is: Inpatient Remains inpatient appropriate because: Multi-system organ failure, remains persistently encephalopathic, needing IV therapies.    Patient is high risk for morbidity and mortality.  Prognosis is poor. Appropriate for comfort care measures.  Family discussing this.  Final disposition: TBD   Consultants:  Neurosurgery-signed off Palliative medicine   Subjective: Pt remains on bipap and unresponsive.  He gets restless with tactile stimulation but has had no meaningful interaction for me or with bedside sitter or nursing staff.  Will try off bipap to monitor his work of breathing and get oral care done.    Objective: Vitals:   04/19/22 0336 04/19/22 0442 04/19/22 0734 04/19/22 1153  BP: (!) 107/54 105/62 (!) 116/55 (!) 124/59  Pulse: 73 69 71 74  Resp: (!) 22 20 (!) 24 (!) 22  Temp: 98.4 F (36.9 C) 98.8 F (37.1 C) 99.5 F (37.5 C) 99.4 F (37.4 C)  TempSrc: Axillary Oral Axillary Axillary  SpO2: 94% 97% 94% 95%  Weight:   89 kg   Height:        Intake/Output Summary (Last 24 hours) at 04/19/2022 1439 Last data filed at 04/19/2022 0658 Gross per 24 hour  Intake 834.85 ml  Output 800 ml  Net 34.85 ml   Filed Weights   04/14/22 0744 04/18/22 0500 04/19/22 0734  Weight: 94 kg 90.1 kg 89 kg    Examination:  General exam: somnolent, becomes briefly restless with tactile stimulation, no acute distress, intermittently reaching hands up in the air HEENT: wearing bipap mask, eyes remain closed, NG tube in place, moist mucus membranes Respiratory system: decreased breath sounds at bases, increased respiratory effort with accessory muscle use, on Bipap. Cardiovascular system: normal  S1/S2, RRR, no pedal edema.   Gastrointestinal system: soft, NT, ND Central nervous system: unable to assess due to somnolence, unresponsive & not following commands Extremities: mittens on both hands, no edema, normal tone Skin: dry, intact, normal temperature Psychiatry: unable to assess due to somnolence, no meaningful interaction     Data Reviewed:  Notable labs ---  CMP with Na 153>>155>>156, Cl 121, glucose 144, BUN 50, Cr 1.40 >> 1.85>>1.81, Mg 2.7  CBC with WBC 26.7  >> 23.9, otherwise normal  Chest xray 04/18/22 0327 --- IMPRESSION: Mild cardiomegaly with pulmonary vascular congestion and interstitial edema.  U/S bilateral lower extremities -- negative for DVT bilaterally.    LOS: 15 days     Ezekiel Slocumb, DO [facility]   If 7PM-7AM, please contact night-coverage Creatinine is stable.

## 2022-04-19 NOTE — Telephone Encounter (Signed)
Noted  

## 2022-04-19 NOTE — Progress Notes (Signed)
OT Cancellation Note  Patient Details Name: TYRIQUE SPORN MRN: [id number] DOB: May 17, 1935   Cancelled Treatment:    Reason Eval/Treat Not Completed: Medical issues which prohibited therapy. Per chart review, pt's CT from 12/29 showed evolving L cerebral hematoma and midline shift. Pt has been on Bipap and plans for family to meet for plan of care decisions. OT arrived to patients room, his eyes remained closed and he has increased agitation and restlessness when physically touched. Pt is unable to actively participate in therapeutic interventions.OT to complete orders at this time.   Darleen Crocker, MS, OTR/L , CBIS ascom [phone]  04/19/22, 11:44 AM

## 2022-04-19 NOTE — Telephone Encounter (Signed)
Pt wife called to let Dr.Sonnnenberg knows that he's admitted in the hospital with dementia, breathing problems, etc. Also before been admitted, pt fell, and while in the hospital they did a ct scan and its worry about the results. They also stopped his Eloquix meds.Pt had an appt 01/05 and she canceled the appt.

## 2022-04-19 NOTE — Progress Notes (Signed)
Nutrition Follow-up  DOCUMENTATION CODES:   Non-severe (moderate) malnutrition in context of chronic illness  INTERVENTION:   -D/c Nepro -Magic cup TID with meals, each supplement provides 290 kcal and 9 grams of protein  -Continue TF via NGT:   Osmolite 1.5 @ 60 ml/hr  60 ml Prosource TF daily  100 ml free water flush every 4 hours  Tube feeding regimen provides 1880 kcal (100% of needs), 95 grams of protein, and 914 ml of H2O.  Total free water: 1514 ml daily  NUTRITION DIAGNOSIS:   Moderate Malnutrition related to chronic illness (dementia) as evidenced by mild muscle depletion, moderate muscle depletion, mild fat depletion, moderate fat depletion.  Ongoing  GOAL:   Patient will meet greater than or equal to 90% of their needs  Progressing   MONITOR:   PO intake, Supplement acceptance, Diet advancement, TF tolerance  REASON FOR ASSESSMENT:   Consult Enteral/tube feeding initiation and management  ASSESSMENT:   87 year old male with medical history of dementia, CAD s/p CABG, HTN, sleep apnea, paroxysmal A-fib on Eliquis, Parkinson's dementia, OSA on CPAP, HFpEF, depression, GERD, HLD, lumbar and cervical DDD, basal and squamous cell carcinoma, and frequent falls. He presented to the ED due to increased confusion and agitation that has been ongoing but worse over the past week.  Wife reports patient has been falling and last fell 2 days ago, hitting his head. CT head showed an acute subdural hemorrhage with minimal left-to-right midline shift.  Patient was evaluated by Neurosurgeon who stated patient is not a surgical candidate given his dementia and frailty. Repeat CT head on 12/19 showed stable subdural hematoma but slight progression on CT head on 12/29. Patient was started on high-dose thiamine. Family interested in exploring all options before changing [place]; including interest in tube feeding. Palliative Care has been consulted.  1/1- TF held secondary to  Bi-pap  Reviewed I/O's: -342 ml x 24 hours and -161 ml since admission  UOP: 1.2 L x 24 hours  Pt lying in bed at time of visit. He did not respond to voice or touch. No family present at time of visit.   Safety sitter at bedside, who reports pt has had periods of agitation. Pt was just taked off Bi-pap. He has not been eating.  Noted NGT in nare.   Palliative care following for goals of care discussions; pt may transition to hospice care.   Medications reviewed and include dextrose 5% @ 100 ml/hr.   Labs reviewed: Na: 156, Mg: 2.7, CBGS: 157-175 (inpatient orders for glycemic control are none).    NUTRITION - FOCUSED PHYSICAL EXAM:  Flowsheet Row Most Recent Value  Orbital Region Moderate depletion  Upper Arm Region Mild depletion  Thoracic and Lumbar Region Mild depletion  Buccal Region Mild depletion  Temple Region Moderate depletion  Clavicle Bone Region Mild depletion  Clavicle and Acromion Bone Region Mild depletion  Scapular Bone Region Mild depletion  Dorsal Hand Mild depletion  Patellar Region Mild depletion  Anterior Thigh Region Mild depletion  Posterior Calf Region Mild depletion  Edema (RD Assessment) None  Hair Reviewed  Eyes Reviewed  Mouth Reviewed  Skin Reviewed  Nails Reviewed       Diet Order:   Diet Order             DIET - DYS 1 Room service appropriate? No; Fluid consistency: Thin  Diet effective now  EDUCATION NEEDS:   Not appropriate for education at this time  Skin:  Skin Assessment: Reviewed RN Assessment  Last BM:  04/17/22 (type 4)  Height:   Ht Readings from Last 1 Encounters:  04/07/2022 '5\' 7"'$  (1.702 m)    Weight:   Wt Readings from Last 1 Encounters:  04/19/22 89 kg    Ideal Body Weight:  67.3 kg  BMI:  Body mass index is 30.73 kg/m.  Estimated Nutritional Needs:   Kcal:  1800-2000 kcal  Protein:  90-100 grams  Fluid:  >/= 1.8 L/day    Loistine Chance, RD, LDN, [place] Registered Dietitian  II Certified Diabetes Care and Education Specialist Please refer to [facility] for RD and/or RD on-call/weekend/after hours pager

## 2022-04-19 NOTE — Progress Notes (Addendum)
Daily Progress Note   Patient Name: Andre [name]       Date: 04/19/2022 DOB: [birth year]  Age: 87 y.o. MRN#: [id number] Attending Physician: Ezekiel Slocumb, DO Primary Care Physician: [name], MD Admit Date: 04/05/2022  Reason for Consultation/Follow-up: Establishing goals of care  Subjective: Notes and labs reviewed.  Patient is resting in bed with sitter at bedside.  BiPAP is in place with even and unlabored respirations, and does not respond to people in the room.    Called to speak to patient's wife.  Discussed his poor status and poor prognosis.  She states she saw him yesterday and inquired about removing the mittens which made him calmer.  She discusses that her daughter is back in town but is actually working.  Discussed the need for family discussion and communication and she states she understands.  Inquired about my reaching out to speak to her daughter which is the patient's stepdaughter.  Patient and spouse have been together for 37 years.  Wife is amenable to my reaching out to her.  Spoke with patient's stepdaughter.  Stepdaughter is able to accurately articulate his status and understands his poor prognosis.  She states her husband is a paramedic.  She states that she has been trying to explain the situation to her mother, but she is having difficulty with processing his status.  She discusses that patient 3 months ago was driving, his speech was clear, and he was having no falls.  She states for the months of July August and September he was doing very well with no deficits.  She states her mother is having difficulty with processing his current status against that.  She states she has tried to explain to her mother that it is the SDH and heart failure that are his main  issues and not the dementia or Parkinson's that is causing his current status.  She states her mother has reached out and is waiting to hear back from his primary care physician, to discuss this with them.  She states her mother is looking for hope.  She states she plans to come to the hospital with her son and husband and her mother later today.  She inquires about hospice care.  Explained continued aggressive path versus comfort focused care and that TOC would get hospice  agency involved if/when they are ready.  Discussed that I would follow-up with them tomorrow.   Length of Stay: 15  Current Medications: Scheduled Meds:   amLODipine  5 mg Oral Daily   buPROPion  150 mg Oral BID   carbidopa-levodopa  0.5 tablet Oral QID   entacapone  200 mg Oral QID   famotidine  20 mg Oral QHS   feeding supplement (NEPRO CARB STEADY)  237 mL Oral TID BM   feeding supplement (PROSource TF20)  60 mL Per Tube Daily   insulin aspart  0-5 Units Subcutaneous QHS   insulin aspart  0-9 Units Subcutaneous TID WC   levothyroxine  50 mcg Oral Q0600   metoprolol tartrate  25 mg Oral BID   polyethylene glycol  17 g Oral Daily   rosuvastatin  10 mg Oral Daily   senna-docusate  1 tablet Oral BID   sertraline  50 mg Oral Daily   sodium chloride flush  3 mL Intravenous Q12H   spironolactone  25 mg Oral Daily   thiamine (VITAMIN B1) injection  100 mg Intravenous TID   Followed by   Derrill Memo ON 04/20/2022] thiamine (VITAMIN B1) injection  100 mg Intravenous Daily   umeclidinium bromide  1 puff Inhalation Daily    Continuous Infusions:  sodium chloride Stopped (04/17/22 0855)   dextrose 75 mL/hr at 04/19/22 0328   feeding supplement (OSMOLITE 1.5 CAL) Stopped (04/18/22 0339)   piperacillin-tazobactam (ZOSYN)  IV 12.5 mL/hr at 04/19/22 0328    PRN Meds: sodium chloride, acetaminophen **OR** acetaminophen, albuterol, bisacodyl, LORazepam, ondansetron **OR** ondansetron (ZOFRAN) IV  Physical Exam Constitutional:       Comments: Eyes closed  Pulmonary:     Comments: On BiPAP            Vital Signs: BP (!) 116/55 (BP Location: Left Arm)   Pulse 71   Temp 99.5 F (37.5 C) (Axillary)   Resp (!) 24   Ht '5\' 7"'$  (1.702 m)   Wt 89 kg   SpO2 94%   BMI 30.73 kg/m  SpO2: SpO2: 94 % O2 Device: O2 Device: Bi-PAP O2 Flow Rate:    Intake/output summary:  Intake/Output Summary (Last 24 hours) at 04/19/2022 1113 Last data filed at 04/19/2022 4098 Gross per 24 hour  Intake 834.85 ml  Output 1175 ml  Net -340.15 ml   LBM: Last BM Date : 04/17/22 Baseline Weight: Weight: 94 kg Most recent weight: Weight: 89 kg     Patient Active Problem List   Diagnosis Date Noted   DNR (do not resuscitate) 04/15/2022   Frequent falls 04/05/2022   Chronic anticoagulation 04/05/2022   Hypertensive urgency 04/05/2022   Chronic diastolic CHF (congestive heart failure) ([place]) 04/05/2022   SDH (subdural hematoma) ([place]) 04/05/2022   Subdural hematoma ([place]) 03/30/2022   Dizziness 10/12/2021   Bruising 10/12/2021   Dementia associated with Parkinson's disease ([place]) 07/07/2021   BPH (benign prostatic hyperplasia) 05/11/2021   Delirium due to multiple etiologies, acute, hyperactive 05/11/2021   Acute decompensated heart failure ([place]) 04/12/2021   Acute exacerbation of CHF (congestive heart failure) ([place]) 04/11/2021   Declining functional status 04/30/2020   Slurred speech 04/20/2020   Hypersomnia 04/20/2020   Gait abnormality 07/31/2019   Pulmonary fibrosis ([place]) 07/05/2019   COPD (chronic obstructive pulmonary disease) ([place]) 07/09/2018   Neck pain 03/09/2018   Myalgia 03/09/2018   Decreased hearing of both ears 03/09/2018   Cervical facet syndrome 01/22/2018   Renal cyst 10/02/2017   Pleural effusion  09/18/2017   Diarrhea 08/21/2017   Anemia 08/21/2017   Renal lesion 07/08/2017   Fall 05/29/2017   Abrasion 02/08/2017   Anxiety and depression 01/05/2017   Prediabetes 10/27/2016   Hypothyroidism 10/27/2016    (HFpEF) heart failure with preserved ejection fraction ([place]) 09/27/2016   Hypothyroidism due to medication 09/07/2016   Parkinson's disease 06/30/2016   Thrombocytopenia ([place]) 01/29/2016   PAF (paroxysmal atrial fibrillation) (H[facility])    BMI 40.0-44.9, adult ([place]) 05/06/2015   Goals of care, counseling/discussion 05/06/2015   Erythrocytosis 01/28/2015   Atherosclerosis of abdominal aorta ([place]) 12/08/2014   Barrett's esophagus 12/31/2013   DDD (degenerative disc disease), cervical 10/01/2013   DDD (degenerative disc disease), lumbar 10/01/2013   Coronary artery disease involving native coronary artery of native heart with angina pectoris ([place]) 10/30/2012   GERD (gastroesophageal reflux disease) 10/30/2012   Hyperlipidemia with target LDL less than 70 10/30/2012   Essential hypertension 10/30/2012   Dyspnea 09/10/2012   Allergic rhinitis 09/10/2012   OSA on CPAP 09/10/2012    Palliative Care Assessment & Plan   Recommendations/Plan:  Patient's stepdaughter, her husband who is a paramedic, her son, and her mother will come to bedside later today to discuss patient's status and care moving forward amongst themselves. Discussed that I would follow-up with them tomorrow.   Code Status:    Code Status Orders  (From admission, onward)           Start     Ordered   04/05/22 0025  Do not attempt resuscitation (DNR)  Continuous       Question Answer Comment  If patient has no pulse and is not breathing Do Not Attempt Resuscitation   If patient has a pulse and/or is breathing: Medical Treatment Goals LIMITED ADDITIONAL INTERVENTIONS: Use medication/IV fluids and cardiac monitoring as indicated; Do not use intubation or mechanical ventilation (DNI), also provide comfort medications.  Transfer to Progressive/Stepdown as indicated, avoid Intensive Care.   Consent: Discussion documented in EHR or advanced directives reviewed      04/05/22 0027           Code Status History     Date  Active Date Inactive Code Status Order ID Comments User Context   05/12/2021 1608 05/25/2021 1843 DNR [id number]  Pershing Proud, NP Inpatient   05/11/2021 1440 05/12/2021 1608 Full Code [id number]  Max Sane, MD Inpatient   04/11/2021 1358 04/13/2021 1915 Full Code [id number]  Leslee Home, DO ED   04/30/2020 2338 05/07/2020 2122 Full Code [id number]  Ivor Costa, MD ED   09/18/2017 1623 09/21/2017 1722 Full Code [id number]  Saundra Shelling, MD Inpatient   09/18/2017 1053 09/18/2017 1225 Full Code [id number]  Wellington Hampshire, MD Inpatient   08/26/2017 1123 08/28/2017 2114 Full Code [id number]  Bettey Costa, MD Inpatient   08/11/2017 0409 08/13/2017 0204 Full Code [id number]  Amelia Jo, MD Inpatient   10/28/2016 1219 11/02/2016 1323 Full Code [id number]  Demetrios Loll, MD Inpatient       Prognosis: Very poor    Care plan was discussed with attending  Thank you for allowing the Palliative Medicine Team to assist in the care of this patient.   Asencion Gowda, NP  Please contact Palliative Medicine Team phone at [phone] for questions and concerns.

## 2022-04-20 DIAGNOSIS — S065XAA Traumatic subdural hemorrhage with loss of consciousness status unknown, initial encounter: Secondary | ICD-10-CM | POA: Diagnosis not present

## 2022-04-20 DIAGNOSIS — G20A1 Parkinson's disease without dyskinesia, without mention of fluctuations: Secondary | ICD-10-CM | POA: Diagnosis not present

## 2022-04-20 DIAGNOSIS — Z7189 Other specified counseling: Secondary | ICD-10-CM | POA: Diagnosis not present

## 2022-04-20 LAB — MAGNESIUM: Magnesium: 2.7 mg/dL — ABNORMAL HIGH (ref 1.7–2.4)

## 2022-04-20 LAB — CBC
HCT: 43.4 % (ref 39.0–52.0)
Hemoglobin: 13.2 g/dL (ref 13.0–17.0)
MCH: 29.5 pg (ref 26.0–34.0)
MCHC: 30.4 g/dL (ref 30.0–36.0)
MCV: 97.1 fL (ref 80.0–100.0)
Platelets: 183 10*3/uL (ref 150–400)
RBC: 4.47 MIL/uL (ref 4.22–5.81)
RDW: 14.5 % (ref 11.5–15.5)
WBC: 22.8 10*3/uL — ABNORMAL HIGH (ref 4.0–10.5)
nRBC: 0 % (ref 0.0–0.2)

## 2022-04-20 LAB — BASIC METABOLIC PANEL
Anion gap: 10 (ref 5–15)
BUN: 51 mg/dL — ABNORMAL HIGH (ref 8–23)
CO2: 22 mmol/L (ref 22–32)
Calcium: 7.4 mg/dL — ABNORMAL LOW (ref 8.9–10.3)
Chloride: 116 mmol/L — ABNORMAL HIGH (ref 98–111)
Creatinine, Ser: 1.77 mg/dL — ABNORMAL HIGH (ref 0.61–1.24)
GFR, Estimated: 37 mL/min — ABNORMAL LOW (ref >60)
Glucose, Bld: 172 mg/dL — ABNORMAL HIGH (ref 70–99)
Potassium: 3.3 mmol/L — ABNORMAL LOW (ref 3.5–5.1)
Sodium: 148 mmol/L — ABNORMAL HIGH (ref 135–145)

## 2022-04-20 LAB — GLUCOSE, CAPILLARY
Glucose-Capillary: 148 mg/dL — ABNORMAL HIGH (ref 70–99)
Glucose-Capillary: 152 mg/dL — ABNORMAL HIGH (ref 70–99)
Glucose-Capillary: 162 mg/dL — ABNORMAL HIGH (ref 70–99)
Glucose-Capillary: 163 mg/dL — ABNORMAL HIGH (ref 70–99)
Glucose-Capillary: 175 mg/dL — ABNORMAL HIGH (ref 70–99)

## 2022-04-20 LAB — PHOSPHORUS: Phosphorus: 3.9 mg/dL (ref 2.5–4.6)

## 2022-04-20 MED ORDER — ACETAMINOPHEN 650 MG RE SUPP: 650.0000 mg | Freq: Four times a day (QID) | RECTAL | Status: DC

## 2022-04-20 MED ORDER — ACETAMINOPHEN 325 MG PO TABS: 650.0000 mg | ORAL_TABLET | Freq: Four times a day (QID) | ORAL | Status: DC | PRN

## 2022-04-20 MED ORDER — POLYVINYL ALCOHOL 1.4 % OP SOLN: 1.0000 [drp] | Freq: Four times a day (QID) | OPHTHALMIC | Status: DC | PRN

## 2022-04-20 MED ORDER — LORAZEPAM 2 MG/ML IJ SOLN: 2.0000 mg | INTRAMUSCULAR | Status: DC | PRN

## 2022-04-20 MED ORDER — GLYCOPYRROLATE 0.2 MG/ML IJ SOLN: 0.2000 mg | INTRAMUSCULAR | Status: DC | PRN

## 2022-04-20 MED ORDER — SODIUM CHLORIDE 0.9 % IV SOLN: INTRAVENOUS | Status: DC

## 2022-04-20 MED ORDER — MORPHINE SULFATE (PF) 2 MG/ML IV SOLN: 2.0000 mg | INTRAVENOUS | Status: DC | PRN

## 2022-04-20 MED ORDER — MORPHINE BOLUS VIA INFUSION
2.0000 mg | INTRAVENOUS | Status: DC
  Administered 2022-04-20 – 2022-04-21 (×3): 2 mg via INTRAVENOUS
  Filled 2022-04-20 (×6): qty 2

## 2022-04-20 MED ORDER — ACETAMINOPHEN 325 MG PO TABS
650.0000 mg | ORAL_TABLET | Freq: Four times a day (QID) | ORAL | Status: DC
  Administered 2022-04-20: 650 mg via ORAL
  Filled 2022-04-20: qty 2

## 2022-04-20 MED ORDER — ACETAMINOPHEN 650 MG RE SUPP: 650.0000 mg | Freq: Four times a day (QID) | RECTAL | Status: DC | PRN

## 2022-04-20 MED ORDER — GLYCOPYRROLATE 1 MG PO TABS: 1.0000 mg | ORAL_TABLET | ORAL | Status: DC | PRN

## 2022-04-20 MED ORDER — MORPHINE 100MG IN NS 100ML (1MG/ML) PREMIX INFUSION
0.0000 mg/h | INTRAVENOUS | Status: DC
  Administered 2022-04-20 – 2022-04-23 (×4): 5 mg/h via INTRAVENOUS
  Filled 2022-04-20 (×4): qty 100

## 2022-04-20 MED ORDER — MORPHINE BOLUS VIA INFUSION: 5.0000 mg | INTRAVENOUS | Status: DC | PRN

## 2022-04-20 NOTE — Progress Notes (Addendum)
Daily Progress Note   Patient Name: Andre [name]       Date: 04/20/2022 DOB: 1935-10-12  Age: 87 y.o. MRN#: [id number] Attending Physician: Richarda Osmond, MD Primary Care Physician: [name], MD Admit Date: 04/14/2022  Reason for Consultation/Follow-up: Establishing goals of care  Subjective: Patient is in bed with sitter at bedside. He is agitated and reaching to ceiling. Mittens in place.   Spoke with patient's step daughter. She states she and her mother came yesterday and had a good conversation with the attending. She states her mother is starting to understand the situation and prognosis. Various questions regarding comfort care, and hospice placement answered. I will follow up tomorrow.   ADDENDUM: Walked by room. Patient is restless and wife is present at bedside. Wife requests to either speak with [facility] here for a second opinion on surgery, or have patient moved if possible to a hospital that would be willing to do surgery for his SDH. Inquired what he would consider acceptable QOL and discussed his diagnoses and prognosis. Discussed pain and suffering.  She states she has to "try", and believes he would want to as well. Attending made aware.   Length of Stay: 16  Current Medications: Scheduled Meds:   amLODipine  5 mg Oral Daily   buPROPion  150 mg Oral BID   carbidopa-levodopa  0.5 tablet Oral QID   entacapone  200 mg Oral QID   famotidine  20 mg Oral QHS   feeding supplement (PROSource TF20)  60 mL Per Tube Daily   insulin aspart  0-5 Units Subcutaneous QHS   insulin aspart  0-9 Units Subcutaneous TID WC   levothyroxine  50 mcg Oral Q0600   metoprolol tartrate  25 mg Oral BID   polyethylene glycol  17 g Oral Daily   rosuvastatin  10 mg Oral Daily    senna-docusate  1 tablet Oral BID   sertraline  50 mg Oral Daily   sodium chloride flush  3 mL Intravenous Q12H   spironolactone  25 mg Oral Daily   thiamine (VITAMIN B1) injection  100 mg Intravenous Daily   umeclidinium bromide  1 puff Inhalation Daily    Continuous Infusions:  sodium chloride Stopped (04/17/22 0855)   dextrose 100 mL/hr at 04/20/22 0830   feeding supplement (OSMOLITE 1.5 CAL)  Stopped (04/18/22 0339)    PRN Meds: sodium chloride, acetaminophen **OR** acetaminophen, albuterol, bisacodyl, diazepam, LORazepam, morphine injection, ondansetron **OR** ondansetron (ZOFRAN) IV  Physical Exam Pulmonary:     Effort: Pulmonary effort is normal.  Neurological:     Mental Status: He is alert.             Vital Signs: BP 95/63   Pulse 65   Temp 99 F (37.2 C) (Axillary)   Resp 20   Ht '5\' 7"'$  (1.702 m)   Wt 89 kg   SpO2 99%   BMI 30.73 kg/m  SpO2: SpO2: 99 % O2 Device: O2 Device: Nasal Cannula O2 Flow Rate: O2 Flow Rate (L/min): 5 L/min  Intake/output summary:  Intake/Output Summary (Last 24 hours) at 04/20/2022 1222 Last data filed at 04/20/2022 0900 Gross per 24 hour  Intake 1819.89 ml  Output 700 ml  Net 1119.89 ml   LBM: Last BM Date : 04/19/22 Baseline Weight: Weight: 94 kg Most recent weight: Weight: 89 kg    Patient Active Problem List   Diagnosis Date Noted   Malnutrition of moderate degree 04/19/2022   DNR (do not resuscitate) 04/15/2022   Frequent falls 04/05/2022   Chronic anticoagulation 04/05/2022   Hypertensive urgency 04/05/2022   Chronic diastolic CHF (congestive heart failure) ([place]) 04/05/2022   SDH (subdural hematoma) ([place]) 04/05/2022   Subdural hematoma ([place]) 03/26/2022   Dizziness 10/12/2021   Bruising 10/12/2021   Dementia associated with Parkinson's disease ([place]) 07/07/2021   BPH (benign prostatic hyperplasia) 05/11/2021   Delirium due to multiple etiologies, acute, hyperactive 05/11/2021   Acute decompensated heart failure  ([place]) 04/12/2021   Acute exacerbation of CHF (congestive heart failure) ([place]) 04/11/2021   Declining functional status 04/30/2020   Slurred speech 04/20/2020   Hypersomnia 04/20/2020   Gait abnormality 07/31/2019   Pulmonary fibrosis ([place]) 07/05/2019   COPD (chronic obstructive pulmonary disease) ([place]) 07/09/2018   Neck pain 03/09/2018   Myalgia 03/09/2018   Decreased hearing of both ears 03/09/2018   Cervical facet syndrome 01/22/2018   Renal cyst 10/02/2017   Pleural effusion 09/18/2017   Diarrhea 08/21/2017   Anemia 08/21/2017   Renal lesion 07/08/2017   Fall 05/29/2017   Abrasion 02/08/2017   Anxiety and depression 01/05/2017   Prediabetes 10/27/2016   Hypothyroidism 10/27/2016   (HFpEF) heart failure with preserved ejection fraction ([place]) 09/27/2016   Hypothyroidism due to medication 09/07/2016   Parkinson's disease 06/30/2016   Thrombocytopenia ([place]) 01/29/2016   PAF (paroxysmal atrial fibrillation) ([facility])    BMI 40.0-44.9, adult ([place]) 05/06/2015   Goals of care, counseling/discussion 05/06/2015   Erythrocytosis 01/28/2015   Atherosclerosis of abdominal aorta ([place]) 12/08/2014   Barrett's esophagus 12/31/2013   DDD (degenerative disc disease), cervical 10/01/2013   DDD (degenerative disc disease), lumbar 10/01/2013   Coronary artery disease involving native coronary artery of native heart with angina pectoris ([place]) 10/30/2012   GERD (gastroesophageal reflux disease) 10/30/2012   Hyperlipidemia with target LDL less than 70 10/30/2012   Essential hypertension 10/30/2012   Dyspnea 09/10/2012   Allergic rhinitis 09/10/2012   OSA on CPAP 09/10/2012    Palliative Care Assessment & Plan   Recommendations/Plan: Step daughter states her mother continues to consider care moving forward, and they are working with her to help. Questions answered about comfort care and hospice.  If wife chooses comfort care and hospice, TOC will need to reach out to hospice liaison for  placement.   ADDENDUM: Wife requesting second opinion from [facility] on surgery.  Attending made aware.   Code Status:    Code Status Orders  (From admission, onward)           Start     Ordered   04/05/22 0025  Do not attempt resuscitation (DNR)  Continuous       Question Answer Comment  If patient has no pulse and is not breathing Do Not Attempt Resuscitation   If patient has a pulse and/or is breathing: Medical Treatment Goals LIMITED ADDITIONAL INTERVENTIONS: Use medication/IV fluids and cardiac monitoring as indicated; Do not use intubation or mechanical ventilation (DNI), also provide comfort medications.  Transfer to Progressive/Stepdown as indicated, avoid Intensive Care.   Consent: Discussion documented in EHR or advanced directives reviewed      04/05/22 0027           Code Status History     Date Active Date Inactive Code Status Order ID Comments User Context   05/12/2021 1608 05/25/2021 1843 DNR [id number]  Pershing Proud, NP Inpatient   05/11/2021 1440 05/12/2021 1608 Full Code [id number]  Max Sane, MD Inpatient   04/11/2021 1358 04/13/2021 1915 Full Code [id number]  Leslee Home, DO ED   04/30/2020 2338 05/07/2020 2122 Full Code [id number]  Ivor Costa, MD ED   09/18/2017 1623 09/21/2017 1722 Full Code [id number]  Saundra Shelling, MD Inpatient   09/18/2017 1053 09/18/2017 1225 Full Code [id number]  Wellington Hampshire, MD Inpatient   08/26/2017 1123 08/28/2017 2114 Full Code [id number]  Bettey Costa, MD Inpatient   08/11/2017 0409 08/13/2017 0204 Full Code [id number]  Amelia Jo, MD Inpatient   10/28/2016 1219 11/02/2016 1323 Full Code [id number]  Demetrios Loll, MD Inpatient       Prognosis: Poor    Thank you for allowing the Palliative Medicine Team to assist in the care of this patient.    Asencion Gowda, NP  Please contact Palliative Medicine Team phone at [phone] for questions and concerns.

## 2022-04-20 NOTE — Progress Notes (Addendum)
PROGRESS NOTE  Andre [name]    DOB: [birth year], 87 y.o.  GQQ:[id number]    Code Status: DNR   DOA: 04/11/2022   LOS: 8   Brief hospital course  Andre [name] is a 87 y.o. male with a PMH significant for dementia, CAD s/p CABG, paroxysmal A-fib on Eliquis, Parkinson's dementia, OSA on CPAP, HFpEF and frequent falls.  They presented from home to the ED on 04/03/2022 with increased agitation and confusion x 7 days. Has been getting gradually worse over longer period of time but the last week was a significant worsening and had a fall with head injury 2 days prior to presentation.   In the ED, it was found that they had BP 159/69 with pulse 54 and otherwise normal vitals.  Significant findings included WBC of 15,500. CBC, BMP, troponin and urinalysis otherwise unremarkable. EKG- sinus at 61 with nonspecific ST-T wave changes.   Head CT showed an acute subdural hemorrhage with minimal left-to-right midline shift as outlined below: IMPRESSION: 1. Left frontal acute subdural hemorrhage measuring 1.1 x 5.6 x 3.2 cm. There is also subdural hemorrhage along the falx cerebri and right tentorium. There is minimal left-to-right midline shift, evaluation is however somewhat limited due to motion. Short-term follow-up examination in 6 hours is recommended. 2. Moderate generalized cerebral atrophy and chronic microvascular ischemic changes of the white matter, unchanged.    The ED provider spoke with neurosurgeon, Dr. Cari Caraway who stated that patient is not a surgical candidate given his dementia and frailty.  He recommended a repeat CT head in 6 hours.  Patient was reversed with Kcentra, started on a Cardene drip for elevated BP of 163/66 and hospitalist consulted for admission.   Repeat CT on 12/19 without significant change of SDH. Neurosurgery consulted and did not feel patient is a surgical candidate for resection of subdural hematoma given his underlying dementia, and recommended conservative  care.   04/20/22 -stable, palliative discussions with family for GOC  UPDATE: 66: Spoke with wife who wishes to proceed with comfort care at this time. She would like his life-prolonging treatments discontinued and to focus on providing him comfort. She will be back to see him tomorrow and hopes to be with him when he passes.   Assessment & Plan  Principal Problem:   Subdural hematoma (HCC) Active Problems:   OSA on CPAP   PAF (paroxysmal atrial fibrillation) (HCC)   Thrombocytopenia (HCC)   Parkinson's disease   Coronary artery disease involving native coronary artery of native heart with angina pectoris (HCC)   Goals of care, counseling/discussion   Essential hypertension   Hypothyroidism   COPD (chronic obstructive pulmonary disease) (HCC)   Slurred speech   Delirium due to multiple etiologies, acute, hyperactive   Dementia associated with Parkinson's disease ([place])   Frequent falls   Chronic anticoagulation   Hypertensive urgency   Chronic diastolic CHF (congestive heart failure) (HCC)   SDH (subdural hematoma) ([place])   DNR (do not resuscitate)   Malnutrition of moderate degree  Delirium/acute metabolic encephalopathy/slurred speech in patient with Parkinson's dementia: has not shown improvement in status, per chart review and discussion with his caregivers prior to me taking over his care. patient is totally disoriented without focal neurodeficit. Shows no obvious signs that he is aware of my presence or response to conversation. Appears to be in pain with intermittent groaning and grimacing.  - Not a candidate for antipsychotics due to Parkinson's. -High-dose thiamine -NG tube placed -Initiated tube feeds 12/31 -Delirium,  fall and aspiration precautions -Continue Sinemet and Comtan. -Monitor urine output closely. -Scheduled bowel regimen possible constipation -Palliative care consult following, appreciate your care - scheduled tylenol since he is unable to request pain  medication. May increase to morphine if not helping   Subdural hematoma in the setting of chronic anticoagulation and recurrent falls.  Received Kcentra for Eliquis reversal.  Eliquis discontinued.  Repeat CT on 12/19 without significant change of SDH.  Neurosurgery consulted and did not feel patient is a surgical candidate for resection of subdural hematoma given his underlying dementia, and recommended conservative care.   -Goals of care discussions underway with family -Appropriate for comfort care measures   Acute on chronic Diastolic CHF: Patient was on IV fluid due to dehydration and poor p.o. intake.  Developed some wheezing and shortness of breath.  CXR concerning for cardiomegaly and pulmonary edema. BNP slightly  -Lasix given overnight 12/31-1/1 for increased work of breathing and repeat CXR with pulmonary vascular congestion. Remains on D5w fluids for worsening hypernatremia, may need further diuresis but renal function is worsening. -Bipap PRN for work of breathing -Closely monitor respiratory and fluid status while on D5w -Continue metoprolol.    Hypernatremia: Na trending up to 152>>155>>156>>>148. -Continue D5 infusion -Continue Aldactone -Intermittent IV Lasix as needed   Dysphagia -Continue dysphagia 1 diet per SLP. -Initiate tube feeds -Aspiration precaution    Hypertensive urgency: BP within acceptable range. -Cardiac meds as above   Chronic COPD: Stable. -Continue Stiolto Respimat with as needed albuterol.   History of CAD: Troponin negative.  EKG without acute ischemic finding. -Continue metoprolol.  Continue rosuvastatin,  Eliquis discontinued.     Obstructive sleep apnea on CPAP: No hypercapnia on VBG. -Currently on Bipap -Continue CPAP nightly if able to tolerate.   Goal of care discussion: Remains DNR/DNI.  See goal of care discussion on 04/15/2022. -Palliative care consulted -Family considering comfort, expect decision soon  Body mass index is 30.73  kg/m.  VTE ppx: SCDs Start: 04/05/22 0025  Diet:     Diet   DIET - DYS 1 Room service appropriate? No; Fluid consistency: Thin   Consultants: Palliative Neurosurgery   Subjective 04/20/22    Pt reports nothing as he is unable to respond. Appears to be in pain with intermittent groaning and grimacing.    Objective   Vitals:   04/19/22 2018 04/20/22 0000 04/20/22 0357 04/20/22 0400  BP: 92/72 (!) 94/51  (!) 102/45  Pulse: 75 65  65  Resp: 19 (!) 21    Temp:  98.9 F (37.2 C) 98.7 F (37.1 C)   TempSrc:  Oral Oral   SpO2: 98% 100%  98%  Weight:      Height:        Intake/Output Summary (Last 24 hours) at 04/20/2022 0741 Last data filed at 04/20/2022 0700 Gross per 24 hour  Intake 1500 ml  Output 700 ml  Net 800 ml   Filed Weights   04/14/22 0744 04/18/22 0500 04/19/22 0734  Weight: 94 kg 90.1 kg 89 kg    Physical Exam:  General: asleep, in acute distress, restless Respiratory: normal respiratory effort. Cardiovascular: quick capillary refill, normal S1/S2, RRR, no JVD, murmurs Gastrointestinal: soft, NT, ND Nervous: non-responsive to voice. Withdraws from pain, moving all limbs Extremities: moves all equally, no edema, normal tone Skin: dry, intact, normal temperature, normal color. No rashes, lesions or ulcers on exposed skin  Labs   I have personally reviewed the following labs and imaging studies CBC  Component Value Date/Time   WBC 22.8 (H) 04/20/2022 0417   RBC 4.47 04/20/2022 0417   HGB 13.2 04/20/2022 0417   HGB 17.0 05/03/2021 1440   HCT 43.4 04/20/2022 0417   HCT 50.9 05/03/2021 1440   PLT 183 04/20/2022 0417   PLT 149 (L) 05/03/2021 1440   MCV 97.1 04/20/2022 0417   MCV 90 05/03/2021 1440   MCV 86 08/12/2014 0816   MCH 29.5 04/20/2022 0417   MCHC 30.4 04/20/2022 0417   RDW 14.5 04/20/2022 0417   RDW 13.7 05/03/2021 1440   RDW 14.8 (H) 08/12/2014 0816   LYMPHSABS 1.7 04/16/2022 0617   LYMPHSABS 2.4 05/03/2021 1440   LYMPHSABS 1.6  08/12/2014 0816   MONOABS 1.9 (H) 04/16/2022 0617   MONOABS 0.8 08/12/2014 0816   EOSABS 0.1 04/16/2022 0617   EOSABS 0.1 05/03/2021 1440   EOSABS 0.1 08/12/2014 0816   BASOSABS 0.1 04/16/2022 0617   BASOSABS 0.0 05/03/2021 1440   BASOSABS 0.1 08/12/2014 0816      Latest Ref Rng & Units 04/20/2022    4:17 AM 04/19/2022    4:58 AM 04/18/2022    5:52 AM  BMP  Glucose 70 - 99 mg/dL 172  144  161   BUN 8 - 23 mg/dL 51  50  55   Creatinine 0.61 - 1.24 mg/dL 1.77  1.81  1.85   Sodium 135 - 145 mmol/L 148  156  155   Potassium 3.5 - 5.1 mmol/L 3.3  3.6  3.6   Chloride 98 - 111 mmol/L 116  121  122   CO2 22 - 32 mmol/L '22  23  21   '$ Calcium 8.9 - 10.3 mg/dL 7.4  7.9  8.0     Disposition Plan & Communication  Patient status: Inpatient  Admitted From: Home Planned disposition location: Hospice care Anticipated discharge date: 1/4 pending family GOC decisions   Family Communication: none at bedside    Author: Richarda Osmond, DO [facility] 04/20/2022, 7:41 AM   Available by Epic secure chat 7AM-7PM. If 7PM-7AM, please contact night-coverage.  [facility] contact information found on [email or web address].

## 2022-04-20 NOTE — Progress Notes (Signed)
PT Cancellation Note  Patient Details Name: Andre [name] MRN: [id number] DOB: [birth year]   Cancelled Treatment:    Reason Eval/Treat Not Completed: Medical issues which prohibited therapy (Per chart review, repeat imaging shows progression/evolution of SDH; patient with increasing agitation, restlessness (intermittently requiring BiPAP).  Unable to participate with skilled PT services at this time.  Palliative care following with ongoing discussions related to comfort care.  Will complete PT orders at this time given change in status and inability to meaningfully participate/progress. Please reconsult should status change.)  [name] H. Owens Shark, PT, DPT, NCS 04/20/22, 2:46 PM [phone]

## 2022-04-20 NOTE — Progress Notes (Signed)
   04/20/22 0000  Assess: MEWS Score  Temp 98.9 F (37.2 C)  BP (!) 94/51  MAP (mmHg) (!) 64  Pulse Rate 65  Resp (!) 21  SpO2 100 %  Assess: MEWS Score  MEWS Temp 0  MEWS Systolic 1  MEWS Pulse 0  MEWS RR 1  MEWS LOC 2  MEWS Score 4  MEWS Score Color Red  Assess: if the MEWS score is Yellow or Red  Were vital signs taken at a resting state? Yes  Focused Assessment No change from prior assessment  Does the patient meet 2 or more of the SIRS criteria? No  MEWS guidelines implemented *See Row Information* No, previously red, continue vital signs every 4 hours  Provider Notification  Provider Name/Title foust NP  Date Provider Notified 04/20/22  Time Provider Notified 0020  Method of Notification  (secure chat)  Notification Reason Change in status  Provider response No new orders  Date of Provider Response 04/20/22  Time of Provider Response 0025  Assess: SIRS CRITERIA  SIRS Temperature  0  SIRS Pulse 0  SIRS Respirations  1  SIRS WBC 0  SIRS Score Sum  1   Patient has been in a red MEWS for over 24 hours.

## 2022-04-21 ENCOUNTER — Telehealth: Payer: Self-pay | Admitting: Family Medicine

## 2022-04-21 DIAGNOSIS — Z515 Encounter for palliative care: Secondary | ICD-10-CM | POA: Diagnosis not present

## 2022-04-21 DIAGNOSIS — S065XAA Traumatic subdural hemorrhage with loss of consciousness status unknown, initial encounter: Secondary | ICD-10-CM | POA: Diagnosis not present

## 2022-04-21 DIAGNOSIS — Z7189 Other specified counseling: Secondary | ICD-10-CM | POA: Diagnosis not present

## 2022-04-21 LAB — CULTURE, BLOOD (ROUTINE X 2)
Culture: NO GROWTH
Culture: NO GROWTH
Special Requests: ADEQUATE
Special Requests: ADEQUATE

## 2022-04-21 NOTE — Progress Notes (Signed)
   04/21/22 1200  Spiritual Encounters  Type of Visit Initial  Care provided to: Patient  Conversation partners present during encounter Other (comment) (CHaplain)  Referral source Nurse (RN/NT/LPN)  Reason for visit End-of-life  OnCall Visit No   Chaplain responded to nurse consult. Patient is unresponsive and no family was present. Chaplain said a silent prayer and spoke with sitter. Chaplain is available if there are further needs when family arrives.

## 2022-04-21 NOTE — Telephone Encounter (Signed)
Appointment is cancelled.  ng

## 2022-04-21 NOTE — Progress Notes (Signed)
PROGRESS NOTE  Andre [name]    DOB: [birth year], 87 y.o.  [id number]    Code Status: DNR   DOA: 03/23/2022   LOS: 60   Brief hospital course  Andre [name] is a 87 y.o. male with a PMH significant for dementia, CAD s/p CABG, paroxysmal A-fib on Eliquis, Parkinson's dementia, OSA on CPAP, HFpEF and frequent falls.  They presented from home to the ED on 04/03/2022 with increased agitation and confusion x 7 days. Has been getting gradually worse over longer period of time but the last week was a significant worsening and had a fall with head injury 2 days prior to presentation.   In the ED, it was found that they had BP 159/69 with pulse 54 and otherwise normal vitals.  Significant findings included WBC of 15,500. CBC, BMP, troponin and urinalysis otherwise unremarkable. EKG- sinus at 61 with nonspecific ST-T wave changes.   Head CT showed an acute subdural hemorrhage with minimal left-to-right midline shift as outlined below: IMPRESSION: 1. Left frontal acute subdural hemorrhage measuring 1.1 x 5.6 x 3.2 cm. There is also subdural hemorrhage along the falx cerebri and right tentorium. There is minimal left-to-right midline shift, evaluation is however somewhat limited due to motion. Short-term follow-up examination in 6 hours is recommended. 2. Moderate generalized cerebral atrophy and chronic microvascular ischemic changes of the white matter, unchanged.    The ED provider spoke with neurosurgeon, Dr. Cari Caraway who stated that patient is not a surgical candidate given his dementia and frailty.  He recommended a repeat CT head in 6 hours.  Patient was reversed with Kcentra, started on a Cardene drip for elevated BP of 163/66 and hospitalist consulted for admission.   Repeat CT on 12/19 without significant change of SDH. Neurosurgery consulted and did not feel patient is a surgical candidate for resection of subdural hematoma given his underlying dementia, and recommended conservative  care.   1/3- patient's wife made decision to transition to comfort care.  04/21/22 -stable, does appear to be comfortable.   Assessment & Plan  Principal Problem:   Subdural hematoma (HCC) Active Problems:   OSA on CPAP   PAF (paroxysmal atrial fibrillation) (HCC)   Thrombocytopenia (HCC)   Parkinson's disease   Coronary artery disease involving native coronary artery of native heart with angina pectoris (HCC)   Goals of care, counseling/discussion   Essential hypertension   Hypothyroidism   COPD (chronic obstructive pulmonary disease) (HCC)   Slurred speech   Delirium due to multiple etiologies, acute, hyperactive   Dementia associated with Parkinson's disease ([place])   Frequent falls   Chronic anticoagulation   Hypertensive urgency   Chronic diastolic CHF (congestive heart failure) (HCC)   SDH (subdural hematoma) ([place])   DNR (do not resuscitate)   Malnutrition of moderate degree  Goal of care discussion: Remains DNR/DNI. Comfort care as of 1/3. Wife stated she would like to be with him when he passes. I would expect passing within hours to days - comfort measures per orders.  - chaplain services consulted - appreciate palliatives care.   Delirium/acute metabolic encephalopathy/slurred speech in patient with Parkinson's dementia: has not shown improvement in status   Subdural hematoma in the setting of chronic anticoagulation and recurrent falls. Not a surgical candidate.    Acute on chronic Diastolic CHF- euvolemic on exam    Hypernatremia: no longer trending   Dysphagia- comfort feeds as requested. NG tube removed.    Hypertensive urgency- no longer monitoring   Chronic COPD:  Stable. Morphine gtt and boluses for air hunger PRN   History of CAD Obstructive sleep apnea on CPAP   Body mass index is 30.73 kg/m.  VTE ppx:   Diet:     Diet   DIET - DYS 1 Room service appropriate? No; Fluid consistency: Thin   Consultants: Palliative Neurosurgery    Subjective 04/21/22    Pt appears to be comfortably resting. No longer trying to get out of bed or pull out lines/tubes which have been removed.    Objective   Vitals:   04/20/22 1501 04/20/22 1656 04/20/22 2214 04/21/22 0717  BP:  (!) 109/50 (!) 105/34 (!) 100/44  Pulse: 65 65 70 65  Resp:  (!) 22  (!) 21  Temp:  98.6 F (37 C) 99.1 F (37.3 C) 98.6 F (37 C)  TempSrc:  Axillary Oral Axillary  SpO2: 92% 96% 99% 98%  Weight:      Height:        Intake/Output Summary (Last 24 hours) at 04/21/2022 0740 Last data filed at 04/20/2022 1630 Gross per 24 hour  Intake 1049.73 ml  Output 250 ml  Net 799.73 ml    Filed Weights   04/14/22 0744 04/18/22 0500 04/19/22 0734  Weight: 94 kg 90.1 kg 89 kg    Physical Exam:  General: asleep, no  acute distress Respiratory: normal respiratory effort. Asynchronous breaths Cardiovascular: warm extremities Nervous: non-responsive to voice. Extremities: no edema, normal tone Skin: dry, intact, normal temperature, normal color. No rashes, lesions or ulcers on exposed skin  Labs   I have personally reviewed the following labs and imaging studies CBC    Component Value Date/Time   WBC 22.8 (H) 04/20/2022 0417   RBC 4.47 04/20/2022 0417   HGB 13.2 04/20/2022 0417   HGB 17.0 05/03/2021 1440   HCT 43.4 04/20/2022 0417   HCT 50.9 05/03/2021 1440   PLT 183 04/20/2022 0417   PLT 149 (L) 05/03/2021 1440   MCV 97.1 04/20/2022 0417   MCV 90 05/03/2021 1440   MCV 86 08/12/2014 0816   MCH 29.5 04/20/2022 0417   MCHC 30.4 04/20/2022 0417   RDW 14.5 04/20/2022 0417   RDW 13.7 05/03/2021 1440   RDW 14.8 (H) 08/12/2014 0816   LYMPHSABS 1.7 04/16/2022 0617   LYMPHSABS 2.4 05/03/2021 1440   LYMPHSABS 1.6 08/12/2014 0816   MONOABS 1.9 (H) 04/16/2022 0617   MONOABS 0.8 08/12/2014 0816   EOSABS 0.1 04/16/2022 0617   EOSABS 0.1 05/03/2021 1440   EOSABS 0.1 08/12/2014 0816   BASOSABS 0.1 04/16/2022 0617   BASOSABS 0.0 05/03/2021 1440    BASOSABS 0.1 08/12/2014 0816      Latest Ref Rng & Units 04/20/2022    4:17 AM 04/19/2022    4:58 AM 04/18/2022    5:52 AM  BMP  Glucose 70 - 99 mg/dL 172  144  161   BUN 8 - 23 mg/dL 51  50  55   Creatinine 0.61 - 1.24 mg/dL 1.77  1.81  1.85   Sodium 135 - 145 mmol/L 148  156  155   Potassium 3.5 - 5.1 mmol/L 3.3  3.6  3.6   Chloride 98 - 111 mmol/L 116  121  122   CO2 22 - 32 mmol/L '22  23  21   '$ Calcium 8.9 - 10.3 mg/dL 7.4  7.9  8.0     Disposition Plan & Communication  Patient status: Inpatient  Admitted From: Home Planned disposition location: expect passing in hospital  Family Communication: none at bedside    Author: Richarda Osmond, DO [facility] 04/21/2022, 7:40 AM   Available by Epic secure chat 7AM-7PM. If 7PM-7AM, please contact night-coverage.  [facility] contact information found on [email or web address].

## 2022-04-21 NOTE — Progress Notes (Signed)
Patient on comfort care. Resting in bed, not arousable at this time. Morphine infusing at 5 mg/hr.  Breathing is regular and unlabored. Safety sitter at bedside.

## 2022-04-21 NOTE — Progress Notes (Signed)
Patient in bed, not arousable at this time. Morphine continue to infuse at 5 mg/hr.  Breathing is regular and unlabored. Wife and step-daughter at bedside.

## 2022-04-21 NOTE — Telephone Encounter (Signed)
Patient has a lab appt 04/26/2022, there are no orders in.

## 2022-04-21 NOTE — Telephone Encounter (Signed)
Patient is currently on comfort measures in the hospital. This lab appointment can be cancelled.

## 2022-04-21 NOTE — Progress Notes (Signed)
Daily Progress Note   Patient Name: SELIG [name]       Date: 04/21/2022 DOB: Jun 18, 1935  Age: 87 y.o. MRN#: [id number] Attending Physician: Andre Osmond, MD Primary Care Physician: Andre Haven, MD Admit Date: 04/10/2022  Reason for Consultation/Follow-up: Establishing goals of care  Subjective: Patient is currently resting in bed at this time on comfort care as per primary team. Morphine drip is in place.  Breathing is regular and unlabored with some intermittent pauses.  He is not arousable during my time at bedside.  I anticipate hospital death.  Primary MD in the bedside during my time at bedside and is aware.  Length of Stay: 17  Current Medications: Scheduled Meds:   buPROPion  150 mg Oral BID   carbidopa-levodopa  0.5 tablet Oral QID   entacapone  200 mg Oral QID   famotidine  20 mg Oral QHS   polyethylene glycol  17 g Oral Daily   sertraline  50 mg Oral Daily    Continuous Infusions:  sodium chloride Stopped (04/17/22 0855)   morphine 5 mg/hr (04/20/22 1911)    PRN Meds: sodium chloride, acetaminophen **OR** acetaminophen, diazepam, glycopyrrolate **OR** glycopyrrolate **OR** glycopyrrolate, LORazepam, LORazepam, morphine injection, morphine, ondansetron **OR** ondansetron (ZOFRAN) IV, polyvinyl alcohol  Physical Exam Constitutional:      Comments: Eyes closed.  No distress noted  Pulmonary:     Effort: Pulmonary effort is normal.             Vital Signs: BP (!) 100/44 (BP Location: Left Arm)   Pulse 65   Temp 98.6 F (37 C) (Axillary)   Resp (!) 21   Ht '5\' 7"'$  (1.702 m)   Wt 91.4 kg   SpO2 98%   BMI 31.56 kg/m  SpO2: SpO2: 98 % O2 Device: O2 Device: Nasal Cannula O2 Flow Rate: O2 Flow Rate (L/min): 2 L/min  Intake/output summary:   Intake/Output Summary (Last 24 hours) at 04/21/2022 1115 Last data filed at 04/21/2022 1101 Gross per 24 hour  Intake 729.84 ml  Output 350 ml  Net 379.84 ml   LBM: Last BM Date : 04/19/22 Baseline Weight: Weight: 94 kg Most recent weight: Weight: 91.4 kg    Patient Active Problem List   Diagnosis Date Noted   Malnutrition of moderate degree 04/19/2022   DNR (  do not resuscitate) 04/15/2022   Frequent falls 04/05/2022   Chronic anticoagulation 04/05/2022   Hypertensive urgency 04/05/2022   Chronic diastolic CHF (congestive heart failure) ([place]) 04/05/2022   SDH (subdural hematoma) ([place]) 04/05/2022   Subdural hematoma ([place]) 04/09/2022   Dizziness 10/12/2021   Bruising 10/12/2021   Dementia associated with Parkinson's disease ([place]) 07/07/2021   BPH (benign prostatic hyperplasia) 05/11/2021   Delirium due to multiple etiologies, acute, hyperactive 05/11/2021   Acute decompensated heart failure ([place]) 04/12/2021   Acute exacerbation of CHF (congestive heart failure) ([place]) 04/11/2021   Declining functional status 04/30/2020   Slurred speech 04/20/2020   Hypersomnia 04/20/2020   Gait abnormality 07/31/2019   Pulmonary fibrosis ([place]) 07/05/2019   COPD (chronic obstructive pulmonary disease) ([place]) 07/09/2018   Neck pain 03/09/2018   Myalgia 03/09/2018   Decreased hearing of both ears 03/09/2018   Cervical facet syndrome 01/22/2018   Renal cyst 10/02/2017   Pleural effusion 09/18/2017   Diarrhea 08/21/2017   Anemia 08/21/2017   Renal lesion 07/08/2017   Fall 05/29/2017   Abrasion 02/08/2017   Anxiety and depression 01/05/2017   Prediabetes 10/27/2016   Hypothyroidism 10/27/2016   (HFpEF) heart failure with preserved ejection fraction ([place]) 09/27/2016   Hypothyroidism due to medication 09/07/2016   Parkinson's disease 06/30/2016   Thrombocytopenia ([place]) 01/29/2016   PAF (paroxysmal atrial fibrillation) (HCC)    BMI 40.0-44.9, adult ([place]) 05/06/2015   Goals of care,  counseling/discussion 05/06/2015   Erythrocytosis 01/28/2015   Atherosclerosis of abdominal aorta ([place]) 12/08/2014   Barrett's esophagus 12/31/2013   DDD (degenerative disc disease), cervical 10/01/2013   DDD (degenerative disc disease), lumbar 10/01/2013   Coronary artery disease involving native coronary artery of native heart with angina pectoris ([place]) 10/30/2012   GERD (gastroesophageal reflux disease) 10/30/2012   Hyperlipidemia with target LDL less than 70 10/30/2012   Essential hypertension 10/30/2012   Dyspnea 09/10/2012   Allergic rhinitis 09/10/2012   OSA on CPAP 09/10/2012    Palliative Care Assessment & Plan    Recommendations/Plan: Patient has been shifted to comfort care by primary team.  Anticipate hospital death.  Code Status:    Code Status Orders  (From admission, onward)           Start     Ordered   04/20/22 1821  Do not attempt resuscitation (DNR)  Continuous       Question Answer Comment  If patient has no pulse and is not breathing Do Not Attempt Resuscitation   If patient has a pulse and/or is breathing: Medical Treatment Goals COMFORT MEASURES: Keep clean/warm/dry, use medication by any route; positioning, wound care and other measures to relieve pain/suffering; use oxygen, suction/manual treatment of airway obstruction for comfort; do not transfer unless for comfort needs.   Consent: Discussion documented in EHR or advanced directives reviewed      04/20/22 1822           Code Status History     Date Active Date Inactive Code Status Order ID Comments User Context   04/05/2022 0027 04/20/2022 1822 DNR [id number]  Athena Masse, MD ED   05/12/2021 1608 05/25/2021 1843 DNR [id number]  Pershing Proud, NP Inpatient   05/11/2021 1440 05/12/2021 1608 Full Code [id number]  Max Sane, MD Inpatient   04/11/2021 1358 04/13/2021 1915 Full Code [id number]  Leslee Home, DO ED   04/30/2020 2338 05/07/2020 2122 Full Code [id number]  Ivor Costa, MD ED    09/18/2017 1623 09/21/2017 1722 Full Code [id number]  Saundra Shelling, MD Inpatient   09/18/2017 1053 09/18/2017 1225 Full Code [id number]  Wellington Hampshire, MD Inpatient   08/26/2017 1123 08/28/2017 2114 Full Code [id number]  Bettey Costa, MD Inpatient   08/11/2017 0409 08/13/2017 0204 Full Code [id number]  Amelia Jo, MD Inpatient   10/28/2016 1219 11/02/2016 1323 Full Code [id number]  Demetrios Loll, MD Inpatient       Prognosis:  Hours - Days    Care plan was discussed with primary MD  Thank you for allowing the Palliative Medicine Team to assist in the care of this patient.   Asencion Gowda, NP  Please contact Palliative Medicine Team phone at [phone] for questions and concerns.

## 2022-04-21 NOTE — Progress Notes (Signed)
Patient in bed, still unresponsive. Morphine continue to infuse at 5 mg/hr.  Breathing is regular and unlabored.

## 2022-04-21 NOTE — TOC Progression Note (Signed)
Transition of Care [facility]) - Progression Note    Patient Details  Name: DEANDREW HOECKER MRN: [id number] Date of Birth: 1935/12/03  Transition of [facility]) CM/SW Contact  Laurena Slimmer, RN Phone Number: 04/21/2022, 10:48 AM  Clinical Narrative:    Case reviewed for changes in disposition. Family considering comfort vs exploring second opinion for surgery related to SDH.    Expected Discharge Plan: [place] Barriers to Discharge: Continued Medical Work up  Expected Discharge Plan and Services   Discharge Planning Services: CM Consult Post Acute Care Choice: [place] Living arrangements for the past 2 months: Single Family Home                                       Social Determinants of Health (SDOH) Interventions SDOH Screenings   Food Insecurity: No Food Insecurity (04/06/2022)  Housing: Low Risk  (04/06/2022)  Transportation Needs: No Transportation Needs (04/06/2022)  Utilities: Not At Risk (04/06/2022)  Depression (PHQ2-9): Low Risk  (01/18/2022)  Financial Resource Strain: Low Risk  (09/14/2021)  Physical Activity: Unknown (12/29/2017)  Social Connections: Unknown (09/14/2021)  Stress: No Stress Concern Present (09/14/2021)  Tobacco Use: Medium Risk (04/17/2022)    Readmission Risk Interventions     No data to display

## 2022-04-22 ENCOUNTER — Ambulatory Visit: Payer: HMO | Admitting: Family Medicine

## 2022-04-22 DIAGNOSIS — S065XAA Traumatic subdural hemorrhage with loss of consciousness status unknown, initial encounter: Secondary | ICD-10-CM | POA: Diagnosis not present

## 2022-04-22 DIAGNOSIS — Z515 Encounter for palliative care: Secondary | ICD-10-CM | POA: Diagnosis not present

## 2022-04-22 MED ORDER — ACETAMINOPHEN 10 MG/ML IV SOLN
1000.0000 mg | Freq: Once | INTRAVENOUS | Status: AC
  Administered 2022-04-22: 1000 mg via INTRAVENOUS
  Filled 2022-04-22: qty 100

## 2022-04-22 NOTE — Care Management Important Message (Signed)
Important Message  Patient Details  Name: Andre [name] MRN: [id number] Date of Birth: August 09, 1935   Medicare Important Message Given:  Other (see comment)  Patient is comfort care. Out of respect for the patient and family no Important Message from [facility] given.   [name] Son 04/22/2022, 3:26 PM

## 2022-04-22 NOTE — Progress Notes (Addendum)
PROGRESS NOTE  Andre [name]    DOB: [birth year], 87 y.o.  CNO:[id number]    Code Status: DNR   DOA: 03/24/2022   LOS: 8   Brief hospital course  Andre [name] is a 87 y.o. male with a PMH significant for dementia, CAD s/p CABG, paroxysmal A-fib on Eliquis, Parkinson's dementia, OSA on CPAP, HFpEF and frequent falls.  They presented from home to the ED on 04/17/2022 with increased agitation and confusion x 7 days. Has been getting gradually worse over longer period of time but the last week was a significant worsening and had a fall with head injury 2 days prior to presentation.   In the ED, it was found that they had BP 159/69 with pulse 54 and otherwise normal vitals.  Significant findings included WBC of 15,500. CBC, BMP, troponin and urinalysis otherwise unremarkable. EKG- sinus at 61 with nonspecific ST-T wave changes.   Head CT showed an acute subdural hemorrhage with minimal left-to-right midline shift as outlined below: IMPRESSION: 1. Left frontal acute subdural hemorrhage measuring 1.1 x 5.6 x 3.2 cm. There is also subdural hemorrhage along the falx cerebri and right tentorium. There is minimal left-to-right midline shift, evaluation is however somewhat limited due to motion. Short-term follow-up examination in 6 hours is recommended. 2. Moderate generalized cerebral atrophy and chronic microvascular ischemic changes of the white matter, unchanged.    The ED provider spoke with neurosurgeon, Dr. Cari Caraway who stated that patient is not a surgical candidate given his dementia and frailty.  He recommended a repeat CT head in 6 hours.  Patient was reversed with Kcentra, started on a Cardene drip for elevated BP of 163/66 and hospitalist consulted for admission.   Repeat CT on 12/19 without significant change of SDH. Neurosurgery consulted and did not feel patient is a surgical candidate for resection of subdural hematoma given his underlying dementia, and recommended conservative  care.   1/3- patient's wife made decision to transition to comfort care.  04/22/22 -stable, does appear to be comfortable.   Assessment & Plan  Principal Problem:   Subdural hematoma (HCC) Active Problems:   OSA on CPAP   PAF (paroxysmal atrial fibrillation) (HCC)   Thrombocytopenia (HCC)   Parkinson's disease   Coronary artery disease involving native coronary artery of native heart with angina pectoris (HCC)   Goals of care, counseling/discussion   Essential hypertension   Hypothyroidism   COPD (chronic obstructive pulmonary disease) (HCC)   Slurred speech   Delirium due to multiple etiologies, acute, hyperactive   Dementia associated with Parkinson's disease ([place])   Frequent falls   Chronic anticoagulation   Hypertensive urgency   Chronic diastolic CHF (congestive heart failure) (HCC)   SDH (subdural hematoma) ([place])   DNR (do not resuscitate)   Malnutrition of moderate degree   Hospice care  Goal of care discussion: Remains DNR/DNI. Comfort care as of 1/3. Wife stated she would like to be with him when he passes. I would expect passing within hours to days - comfort measures per orders.  - chaplain services consulted - appreciate palliatives care.   Delirium/acute metabolic encephalopathy/slurred speech in patient with Parkinson's dementia: has not shown improvement in status   Subdural hematoma in the setting of chronic anticoagulation and recurrent falls. Not a surgical candidate.    Acute on chronic Diastolic CHF- euvolemic on exam    Hypernatremia: no longer trending   Dysphagia- comfort feeds as requested. NG tube removed.    Hypertensive urgency- no longer monitoring  Chronic COPD: Stable. Morphine gtt and boluses for air hunger PRN   History of CAD Obstructive sleep apnea on CPAP   Body mass index is 31.56 kg/m.  VTE ppx:   Diet:     Diet   DIET - DYS 1 Room service appropriate? No; Fluid consistency: Thin    Consultants: Palliative Neurosurgery   Subjective 04/22/22    Pt appears to be comfortably resting. No longer trying to get out of bed or pull out lines/tubes which have been removed.    Objective   Vitals:   04/20/22 2214 04/21/22 0717 04/21/22 1538 04/21/22 2008  BP: (!) 105/34 (!) 100/44  (!) 73/32  Pulse: 70 65 84 80  Resp:  (!) '21 16 20  '$ Temp: 99.1 F (37.3 C) 98.6 F (37 C)  (!) 101.3 F (38.5 C)  TempSrc: Oral Axillary    SpO2: 99% 98% 90% (!) 81%  Weight:  91.4 kg    Height:        Intake/Output Summary (Last 24 hours) at 04/22/2022 0720 Last data filed at 04/21/2022 1755 Gross per 24 hour  Intake 70 ml  Output 200 ml  Net -130 ml    Filed Weights   04/18/22 0500 04/19/22 0734 04/21/22 0717  Weight: 90.1 kg 89 kg 91.4 kg    Physical Exam:  General: asleep, no  acute distress Respiratory: normal respiratory effort. synchronous breaths Cardiovascular: cool extremities Nervous: non-responsive to voice. Extremities: no edema, normal tone Skin: dry, intact, normal temperature, normal color. No rashes, lesions or ulcers on exposed skin  Labs   I have personally reviewed the following labs and imaging studies CBC    Component Value Date/Time   WBC 22.8 (H) 04/20/2022 0417   RBC 4.47 04/20/2022 0417   HGB 13.2 04/20/2022 0417   HGB 17.0 05/03/2021 1440   HCT 43.4 04/20/2022 0417   HCT 50.9 05/03/2021 1440   PLT 183 04/20/2022 0417   PLT 149 (L) 05/03/2021 1440   MCV 97.1 04/20/2022 0417   MCV 90 05/03/2021 1440   MCV 86 08/12/2014 0816   MCH 29.5 04/20/2022 0417   MCHC 30.4 04/20/2022 0417   RDW 14.5 04/20/2022 0417   RDW 13.7 05/03/2021 1440   RDW 14.8 (H) 08/12/2014 0816   LYMPHSABS 1.7 04/16/2022 0617   LYMPHSABS 2.4 05/03/2021 1440   LYMPHSABS 1.6 08/12/2014 0816   MONOABS 1.9 (H) 04/16/2022 0617   MONOABS 0.8 08/12/2014 0816   EOSABS 0.1 04/16/2022 0617   EOSABS 0.1 05/03/2021 1440   EOSABS 0.1 08/12/2014 0816   BASOSABS 0.1 04/16/2022  0617   BASOSABS 0.0 05/03/2021 1440   BASOSABS 0.1 08/12/2014 0816      Latest Ref Rng & Units 04/20/2022    4:17 AM 04/19/2022    4:58 AM 04/18/2022    5:52 AM  BMP  Glucose 70 - 99 mg/dL 172  144  161   BUN 8 - 23 mg/dL 51  50  55   Creatinine 0.61 - 1.24 mg/dL 1.77  1.81  1.85   Sodium 135 - 145 mmol/L 148  156  155   Potassium 3.5 - 5.1 mmol/L 3.3  3.6  3.6   Chloride 98 - 111 mmol/L 116  121  122   CO2 22 - 32 mmol/L '22  23  21   '$ Calcium 8.9 - 10.3 mg/dL 7.4  7.9  8.0     Disposition Plan & Communication  Patient status: Inpatient  Admitted From: Home Planned disposition location:  expect passing in hospital  Family Communication: wife on phone   Author: Richarda Osmond, DO [facility] 04/22/2022, 7:20 AM   Available by Epic secure chat 7AM-7PM. If 7PM-7AM, please contact night-coverage.  [facility] contact information found on [email or web address].

## 2022-04-22 NOTE — Progress Notes (Signed)
Nutrition Brief Note  Chart reviewed. Pt now transitioning to comfort care.  No further nutrition interventions planned at this time.  Please re-consult as needed.   Desera Graffeo W, RD, LDN, CDCES Registered Dietitian II Certified Diabetes Care and Education Specialist Please refer to AMION for RD and/or RD on-call/weekend/after hours pager   

## 2022-04-22 NOTE — TOC Progression Note (Signed)
Transition of Care [facility]) - Progression Note    Patient Details  Name: Andre [name] MRN: [id number] Date of Birth: 06-22-1935  Transition of Care [facility]) CM/SW Contact  Laurena Slimmer, RN Phone Number: 04/22/2022, 1:21 PM  Clinical Narrative:   Patient is comfort care.    Expected Discharge Plan: [place] Barriers to Discharge: Continued Medical Work up  Expected Discharge Plan and Services   Discharge Planning Services: CM Consult Post Acute Care Choice: [place] Living arrangements for the past 2 months: Single Family Home                                       Social Determinants of Health (SDOH) Interventions SDOH Screenings   Food Insecurity: No Food Insecurity (04/06/2022)  Housing: Low Risk  (04/06/2022)  Transportation Needs: No Transportation Needs (04/06/2022)  Utilities: Not At Risk (04/06/2022)  Depression (PHQ2-9): Low Risk  (01/18/2022)  Financial Resource Strain: Low Risk  (09/14/2021)  Physical Activity: Unknown (12/29/2017)  Social Connections: Unknown (09/14/2021)  Stress: No Stress Concern Present (09/14/2021)  Tobacco Use: Medium Risk (04/09/2022)    Readmission Risk Interventions     No data to display

## 2022-04-26 ENCOUNTER — Other Ambulatory Visit: Payer: HMO

## 2022-05-01 DIAGNOSIS — G4733 Obstructive sleep apnea (adult) (pediatric): Secondary | ICD-10-CM | POA: Diagnosis not present

## 2022-05-01 DIAGNOSIS — R0681 Apnea, not elsewhere classified: Secondary | ICD-10-CM | POA: Diagnosis not present

## 2022-05-01 DIAGNOSIS — G4710 Hypersomnia, unspecified: Secondary | ICD-10-CM | POA: Diagnosis not present

## 2022-05-19 NOTE — Progress Notes (Addendum)
Upon entering room patient was found to be with no signs of breathing. Auscultated heart, no heart beat noted. Second nurse, Janett Billow Christmas at bedside as well to pronounced patients passing at [phone]. Dr. Dwyane Dee was paged to make aware of time patient was pronounced. Wife patricia was also called and made aware, family will come to bedside.  [facility] called referral number is [phone], patient is not a candidate for organ donation.

## 2022-05-19 NOTE — Discharge Summary (Signed)
Physician Discharge Summary   Patient: Andre [name] MRN: [id number] DOB: 09-Nov-1935  Admit date:     04/03/2022  Discharge date: 2022/04/27  Discharge Physician: Oran Rein   PCP: [name], MD   Recommendations at discharge:   Pt deceased  Discharge Diagnoses: Principal Problem:   Subdural hematoma ([place]) Active Problems:   OSA on CPAP   PAF (paroxysmal atrial fibrillation) (H[facility])   Thrombocytopenia ([place])   Parkinson's disease   Coronary artery disease involving native coronary artery of native heart with angina pectoris ([place])   Goals of care, counseling/discussion   Essential hypertension   Hypothyroidism   COPD (chronic obstructive pulmonary disease) ([place])   Slurred speech   Delirium due to multiple etiologies, acute, hyperactive   Dementia associated with Parkinson's disease ([place])   Frequent falls   Chronic anticoagulation   Hypertensive urgency   Chronic diastolic CHF (congestive heart failure) ([place])   SDH (subdural hematoma) ([place])   DNR (do not resuscitate)   Malnutrition of moderate degree   Hospice care  Resolved Problems:   * No resolved hospital problems. *  Hospital Course: Andre [name] is a 87 y.o. male with medical history significant for community dwelling white male, lives with his wife with history of CAD s/p CABG, paroxysmal A-fib on Eliquis,, parkinsonism with Parkinson's dementia, OSA on CPAP, HFpEF and frequent falls who was brought to the ED with concerns for increased confusion and agitation that has been ongoing but worse over the past week.  CT scan showed a left frontal acute subdural hematoma measuring 1.1 x 5.6 x 3.2 cm.  There is also subdural hemorrhage along the falx cerebri and right tentorium. There is minimal left-to-right midline shift.  Patient is evaluated by neurosurgery, no intervention is recommended.  Assessment and Plan:  Andre [name] is a 87 y.o. male with a PMH significant for dementia, CAD s/p CABG, paroxysmal A-fib  on Eliquis, Parkinson's dementia, OSA on CPAP, HFpEF and frequent falls.   They presented from home to the ED on 04/12/2022 with increased agitation and confusion x 7 days. Has been getting gradually worse over longer period of time but the last week was a significant worsening and had a fall with head injury 2 days prior to presentation.    In the ED, it was found that they had BP 159/69 with pulse 54 and otherwise normal vitals.  Significant findings included WBC of 15,500. CBC, BMP, troponin and urinalysis otherwise unremarkable. EKG- sinus at 61 with nonspecific ST-T wave changes.    Head CT showed an acute subdural hemorrhage with minimal left-to-right midline shift as outlined below: IMPRESSION: 1. Left frontal acute subdural hemorrhage measuring 1.1 x 5.6 x 3.2 cm. There is also subdural hemorrhage along the falx cerebri and right tentorium. There is minimal left-to-right midline shift, evaluation is however somewhat limited due to motion. Short-term follow-up examination in 6 hours is recommended. 2. Moderate generalized cerebral atrophy and chronic microvascular ischemic changes of the white matter, unchanged.    The ED provider spoke with neurosurgeon, Dr. Cari Caraway who stated that patient is not a surgical candidate given his dementia and frailty.  He recommended a repeat CT head in 6 hours.  Patient was reversed with Kcentra, started on a Cardene drip for elevated BP of 163/66 and hospitalist consulted for admission.    Repeat CT on 12/19 without significant change of SDH. Neurosurgery consulted and did not feel patient is a surgical candidate for resection of subdural hematoma given his  underlying dementia, and recommended conservative care.    1/3- patient's wife made decision to transition to comfort care.   05-21-2022 -stable, does appear to be comfortable.   2022/05/21 : Pt death was pronounced by nurse at 8:32 AM.    Goal of care discussion: Remains DNR/DNI. Comfort care  as of 1/3. Wife stated she would like to be with him when he passes. I would expect passing within hours to days - comfort measures per orders.  - chaplain services consulted - appreciate palliatives care.    Delirium/acute metabolic encephalopathy/slurred speech in patient with Parkinson's dementia: has not shown improvement in status   Subdural hematoma in the setting of chronic anticoagulation and recurrent falls. Not a surgical candidate.    Acute on chronic Diastolic CHF- euvolemic on exam    Hypernatremia: no longer trending   Dysphagia- comfort feeds as requested. NG tube removed.    Hypertensive urgency- no longer monitoring   Chronic COPD: Stable. Morphine gtt and boluses for air hunger PRN   History of CAD Obstructive sleep apnea on CPAP      Pain control - [place] Controlled Substance Reporting System database was reviewed. and patient was instructed, not to drive, operate heavy machinery, perform activities at heights, swimming or participation in water activities or provide baby-sitting services while on Pain, Sleep and Anxiety Medications; until their outpatient Physician has advised to do so again. Also recommended to not to take more than prescribed Pain, Sleep and Anxiety Medications.  Disposition:  Pt expired  Diet recommendation:  Pt expired   DISCHARGE MEDICATION:   Discharge Exam: Filed Weights   04/19/22 0734 04/21/22 0717 2022/05/22 0833  Weight: 89 kg 91.4 kg 97.4 kg    Condition at discharge:  Pt expired   The results of significant diagnostics from this hospitalization (including imaging, microbiology, ancillary and laboratory) are listed below for reference.   Imaging Studies: DG Chest 1 View  Result Date: 04/18/2022 CLINICAL DATA:  Dyspnea EXAM: CHEST  1 VIEW COMPARISON:  04/16/2022 FINDINGS: Mild cardiomegaly with pulmonary vascular congestion and interstitial opacity. No focal airspace consolidation. No sizable pleural effusion.  Esophageal catheter tip projects at the gastroduodenal junction. IMPRESSION: Mild cardiomegaly with pulmonary vascular congestion and interstitial edema. Electronically Signed   By: Ulyses Jarred M.D.   On: 04/18/2022 03:38   US Venous Img Lower Bilateral (DVT)  Result Date: 04/17/2022 CLINICAL DATA:  Altered mental status, fever EXAM: BILATERAL LOWER EXTREMITY VENOUS DOPPLER ULTRASOUND TECHNIQUE: Gray-scale sonography with graded compression, as well as color Doppler and duplex ultrasound were performed to evaluate the lower extremity deep venous systems from the level of the common femoral vein and including the common femoral, femoral, profunda femoral, popliteal and calf veins including the posterior tibial, peroneal and gastrocnemius veins when visible. The superficial great saphenous vein was also interrogated. Spectral Doppler was utilized to evaluate flow at rest and with distal augmentation maneuvers in the common femoral, femoral and popliteal veins. COMPARISON:  None Available. FINDINGS: RIGHT LOWER EXTREMITY Common Femoral Vein: No evidence of thrombus. Normal compressibility, respiratory phasicity and response to augmentation. Saphenofemoral Junction: No evidence of thrombus. Normal compressibility and flow on color Doppler imaging. Profunda Femoral Vein: No evidence of thrombus. Normal compressibility and flow on color Doppler imaging. Femoral Vein: No evidence of thrombus. Normal compressibility, respiratory phasicity and response to augmentation. Popliteal Vein: No evidence of thrombus. Normal compressibility, respiratory phasicity and response to augmentation. Calf Veins: No evidence of thrombus. Normal compressibility and flow on color  Doppler imaging. Superficial Great Saphenous Vein: No evidence of thrombus. Normal compressibility. Venous Reflux:  None. Other Findings:  None. LEFT LOWER EXTREMITY Common Femoral Vein: No evidence of thrombus. Normal compressibility, respiratory phasicity and  response to augmentation. Saphenofemoral Junction: No evidence of thrombus. Normal compressibility and flow on color Doppler imaging. Profunda Femoral Vein: No evidence of thrombus. Normal compressibility and flow on color Doppler imaging. Femoral Vein: No evidence of thrombus. Normal compressibility, respiratory phasicity and response to augmentation. Popliteal Vein: No evidence of thrombus. Normal compressibility, respiratory phasicity and response to augmentation. Calf Veins: No evidence of thrombus. Normal compressibility and flow on color Doppler imaging. Superficial Great Saphenous Vein: No evidence of thrombus. Normal compressibility. Venous Reflux:  None. Other Findings:  None. IMPRESSION: No evidence of deep venous thrombosis in either lower extremity. Electronically Signed   By: Jacqulynn Cadet M.D.   On: 04/17/2022 10:05   CT CHEST ABDOMEN PELVIS W CONTRAST  Result Date: 04/16/2022 CLINICAL DATA:  Sepsis EXAM: CT CHEST, ABDOMEN, AND PELVIS WITH CONTRAST TECHNIQUE: Multidetector CT imaging of the chest, abdomen and pelvis was performed following the standard protocol during bolus administration of intravenous contrast. RADIATION DOSE REDUCTION: This exam was performed according to the departmental dose-optimization program which includes automated exposure control, adjustment of the mA and/or kV according to patient size and/or use of iterative reconstruction technique. CONTRAST:  163m OMNIPAQUE IOHEXOL 300 MG/ML  SOLN COMPARISON:  Chest x-ray 04/16/2022, CT 05/12/2021, 01/11/2018 FINDINGS: CT CHEST FINDINGS Cardiovascular: Moderate aortic atherosclerosis. No aneurysm. Postsurgical changes of the mediastinum. Coronary vascular calcification. Cardiomegaly. No pericardial effusion Mediastinum/Nodes: Midline trachea. No thyroid mass. No suspicious lymph nodes. Small hiatal hernia. Esophageal tube tip in the distal stomach. Lungs/Pleura: Emphysema. Mild bilateral pulmonary fibrosis and subpleural  ground-glass density corresponding to history of chronic lung disease. Calcified posterior pleural plaques with chronic small right pleural effusion. Musculoskeletal: Sternum is intact.  No acute osseous abnormality. CT ABDOMEN PELVIS FINDINGS Hepatobiliary: No focal liver abnormality is seen. Status post cholecystectomy. No biliary dilatation. Pancreas: Unremarkable. No pancreatic ductal dilatation or surrounding inflammatory changes. Fatty atrophy Spleen: Calcified granuloma Adrenals/Urinary Tract: Adrenal glands are within normal limits. Kidneys show no hydronephrosis. Bilateral renal cysts. Dominant left renal cyst measuring 16.7 x 17.8 cm. Some areas with slight increased internal density value. Small bladder diverticula. Stomach/Bowel: The stomach is nonenlarged. No dilated small bowel. No acute bowel wall thickening. Negative appendix. Mild diverticular disease of the left colon. Moderate retained feces at the rectum. Vascular/Lymphatic: Advanced aortic atherosclerosis. No aneurysm. No suspicious lymph nodes. Reproductive: Prostate is unremarkable. Other: Negative for pelvic effusion or free air. Musculoskeletal: No acute osseous abnormality IMPRESSION: 1. No CT evidence for acute intrathoracic, intra-abdominal, or intrapelvic abnormality. 2. Emphysema and chronic lung disease. 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 Small chronic right pleural effusion. 3. Large left renal cyst measuring up to 17.8 cm. Some areas with slight increased internal density value, possible mild proteinaceous or hemorrhagic component. No internal gas or thick rim enhancement to suggest infected cyst. 4. Mild diverticular disease of the left colon without acute inflammatory process. 5. Aortic atherosclerosis. Aortic Atherosclerosis (ICD10-I70.0) and Emphysema (ICD10-J43.9). Electronically Signed   By: KDonavan FoilM.D.   On: 04/16/2022 22:24   DG Abd 1 View  Result Date: 04/16/2022 CLINICAL  DATA:  NGT placement EXAM: ABDOMEN - 1 VIEW COMPARISON:  04/15/2022 FINDINGS: Examination including the upper abdomen demonstrates a NG tube with the tip below the diaphragm. Unremarkable bowel gas pattern. There are  cholecystectomy clips. IMPRESSION: NG tube tip below the diaphragm. Electronically Signed   By: Sammie Bench M.D.   On: 04/16/2022 18:34   DG Chest Port 1 View  Result Date: 04/16/2022 CLINICAL DATA:  Shortness of breath EXAM: PORTABLE CHEST 1 VIEW COMPARISON:  04/12/2022 FINDINGS: Cardiomegaly, pulmonary vascular congestion and interstitial opacities are noted. CABG changes again noted. There is no evidence of pneumothorax. There may be trace bilateral pleural effusions present. No acute bony abnormalities are noted. IMPRESSION: Cardiomegaly with pulmonary vascular congestion and interstitial opacities - likely interstitial edema. Question trace bilateral pleural effusions. Electronically Signed   By: Margarette Canada M.D.   On: 04/16/2022 14:57   DG Abd Portable 1V  Result Date: 04/15/2022 CLINICAL DATA:  Abdominal tenderness. EXAM: PORTABLE ABDOMEN - 1 VIEW COMPARISON:  None Available. FINDINGS: The bowel gas pattern is normal. Radiopaque surgical clips are seen overlying the right upper quadrant. No radio-opaque calculi or other significant radiographic abnormality are seen. IMPRESSION: Negative. Electronically Signed   By: Virgina Norfolk M.D.   On: 04/15/2022 19:21   CT HEAD WO CONTRAST (5MM)  Result Date: 04/15/2022 CLINICAL DATA:  Frequent falls EXAM: CT HEAD WITHOUT CONTRAST TECHNIQUE: Contiguous axial images were obtained from the base of the skull through the vertex without intravenous contrast. RADIATION DOSE REDUCTION: This exam was performed according to the departmental dose-optimization program which includes automated exposure control, adjustment of the mA and/or kV according to patient size and/or use of iterative reconstruction technique. COMPARISON:  04/05/22 CT head  FINDINGS: Brain: Evolving left cerebral convexity subdural hematoma which has increased in size compared to prior exam now measuring up to 1.4 cm (previously up to 1.2 cm), with interval decrease in the volume of hyperdense blood products. The volume of subdural blood products along the falx has also decreased from prior exam, but the degree rightward midline shift has increased, now measuring up to 5 mm, previously 3 mm. No hydrocephalus. No CT evidence of an acute infarct. Vascular: No hyperdense vessel or unexpected calcification. Skull: Normal. Negative for fracture or focal lesion. Sinuses/Orbits: Bilateral lens replacement. Paranasal sinuses and mastoid air cells are clear. Other: None. IMPRESSION: 1. Evolving left cerebral convexity subdural hematoma, which has increased in size compared to prior exam, now measuring up to 1.4 cm (previously 1.2 cm), with interval decrease in the volume of hyperdense blood products. The volume of subdural blood products along the falx has also decreased from prior exam. 2. Increased rightward midline shift, now measuring up to 5 mm, previously 3 mm. Electronically Signed   By: Marin Roberts M.D.   On: 04/15/2022 17:06   DG Chest Port 1 View  Result Date: 04/12/2022 CLINICAL DATA:  Shortness of breath EXAM: PORTABLE CHEST 1 VIEW COMPARISON:  04/05/2022 FINDINGS: Bilateral diffuse interstitial thickening. No focal consolidation. No pleural effusion or pneumothorax. Stable cardiomegaly. Prior CABG. No acute osseous abnormality. IMPRESSION: 1. Mild CHF. Electronically Signed   By: Kathreen Devoid M.D.   On: 04/12/2022 08:52   CT Head Wo Contrast  Result Date: 04/05/2022 CLINICAL DATA:  rpt ich.  Follow-up bleed EXAM: CT HEAD WITHOUT CONTRAST TECHNIQUE: Contiguous axial images were obtained from the base of the skull through the vertex without intravenous contrast. RADIATION DOSE REDUCTION: This exam was performed according to the departmental dose-optimization program which  includes automated exposure control, adjustment of the mA and/or kV according to patient size and/or use of iterative reconstruction technique. COMPARISON:  CT head 04/10/2022 FINDINGS: Brain: Cerebral ventricle sizes are concordant  with the degree of cerebral volume loss. Patchy and confluent areas of decreased attenuation are noted throughout the deep and periventricular white matter of the cerebral hemispheres bilaterally, compatible with chronic microvascular ischemic disease. T no evidence of large-territorial acute infarction. No parenchymal hemorrhage. No mass lesion. Acute left calvarial convexity subdural hematoma measuring 14 mm with extension along the falx cerebral and tentorium. Stable acute 3 mm right subdural hematoma. No new acute extra-axial collection. No mass effect. Stable 3 mm left-to-right midline shift. No hydrocephalus. Basilar cisterns are patent. Vascular: No hyperdense vessel. Skull: No acute fracture or focal lesion. Sinuses/Orbits: Paranasal sinuses and mastoid air cells are clear. Bilateral lens replacement. Otherwise the orbits are unremarkable. Other: None. IMPRESSION: 1. Stable acute left 14 mm subdural hematoma extending along the falx ceribri and tentorium. Stable 3 mm left-to-right midline shift. 2.   Stable acute 3 mm right subdural hematoma. 3. No new acute intracranial abnormality. Electronically Signed   By: Iven Finn M.D.   On: 04/05/2022 03:12   Portable Chest 1 View  Result Date: 04/05/2022 CLINICAL DATA:  Fall EXAM: PORTABLE CHEST 1 VIEW COMPARISON:  03/25/2022 FINDINGS: Mild patchy right lung opacities with volume loss, favoring atelectasis. Cardiomegaly with mild perihilar edema. No definite pleural effusions. No pneumothorax. Postsurgical changes related to prior CABG. IMPRESSION: Mild patchy right lung opacities with volume loss, favoring atelectasis. Cardiomegaly with mild perihilar edema. Electronically Signed   By: Julian Hy M.D.   On: 04/05/2022  01:22   CT Head Wo Contrast  Result Date: 03/26/2022 CLINICAL DATA:  Mental status change. EXAM: CT HEAD WITHOUT CONTRAST TECHNIQUE: Contiguous axial images were obtained from the base of the skull through the vertex without intravenous contrast. RADIATION DOSE REDUCTION: This exam was performed according to the departmental dose-optimization program which includes automated exposure control, adjustment of the mA and/or kV according to patient size and/or use of iterative reconstruction technique. COMPARISON:  CT examination dated May 11, 2021 FINDINGS: Brain: Left frontal subdural hemorrhage measuring a proximally 1.1 x 5.6 by 3.2 cm. There is also subdural hemorrhage along the falx cerebri and right tentorium. There is minimal left-to-right midline shift, evaluation is however somewhat limited due to motion. Moderate generalized cerebral atrophy and chronic microvascular ischemic changes of the white matter, unchanged. Vascular: No hyperdense vessel or unexpected calcification. Skull: Normal. Negative for fracture or focal lesion. Sinuses/Orbits: No acute finding. Other: None. IMPRESSION: 1. Left frontal acute subdural hemorrhage measuring 1.1 x 5.6 x 3.2 cm. There is also subdural hemorrhage along the falx cerebri and right tentorium. There is minimal left-to-right midline shift, evaluation is however somewhat limited due to motion. Short-term follow-up examination in 6 hours is recommended. 2. Moderate generalized cerebral atrophy and chronic microvascular ischemic changes of the white matter, unchanged. Above findings were reported to Dr. Beather Arbour at approximately 9:37 p.m. on 04/06/2022. Electronically Signed   By: Keane Police D.O.   On: 03/25/2022 21:37   DG Chest Portable 1 View  Result Date: 03/25/2022 CLINICAL DATA:  Chest pain EXAM: PORTABLE CHEST 1 VIEW COMPARISON:  05/11/2021 FINDINGS: The lungs are clear without focal pneumonia, edema, pneumothorax or pleural effusion. The cardio pericardial  silhouette is enlarged. Diffuse interstitial and patchy mid and lower lung airspace disease is similar to prior. Calcified pleural plaques evident. Small pleural effusions noted bilaterally. The visualized bony structures of the thorax are unremarkable. IMPRESSION: Similar appearance of diffuse interstitial and patchy mid and lower lung airspace disease with small bilateral pleural effusions. Electronically Signed  By: Misty Stanley M.D.   On: 03/25/2022 07:27    Microbiology: Results for orders placed or performed during the hospital encounter of 04/03/2022  Resp panel by RT-PCR (RSV, Flu A&B, Covid) Anterior Nasal Swab     Status: None   Collection Time: 04/05/22  2:07 PM   Specimen: Anterior Nasal Swab  Result Value Ref Range Status   SARS Coronavirus 2 by RT PCR NEGATIVE NEGATIVE Final    Comment: (NOTE) SARS-CoV-2 target nucleic acids are NOT DETECTED.  The SARS-CoV-2 RNA is generally detectable in upper respiratory specimens during the acute phase of infection. The lowest concentration of SARS-CoV-2 viral copies this assay can detect is 138 copies/mL. A negative result does not preclude SARS-Cov-2 infection and should not be used as the sole basis for treatment or other patient management decisions. A negative result may occur with  improper specimen collection/handling, submission of specimen other than nasopharyngeal swab, presence of viral mutation(s) within the areas targeted by this assay, and inadequate number of viral copies(<138 copies/mL). A negative result must be combined with clinical observations, patient history, and epidemiological information. The expected result is Negative.  Fact Sheet for Patients:  EntrepreneurPulse.com.au  Fact Sheet for Healthcare Providers:  IncredibleEmployment.be  This test is no t yet approved or cleared by the [place] FDA and  has been authorized for detection and/or diagnosis of SARS-CoV-2  by FDA under an Emergency Use Authorization (EUA). This EUA will remain  in effect (meaning this test can be used) for the duration of the COVID-19 declaration under Section 564(b)(1) of the Act, 21 U.S.C.section 360bbb-3(b)(1), unless the authorization is terminated  or revoked sooner.       Influenza A by PCR NEGATIVE NEGATIVE Final   Influenza B by PCR NEGATIVE NEGATIVE Final    Comment: (NOTE) The Xpert Xpress SARS-CoV-2/FLU/RSV plus assay is intended as an aid in the diagnosis of influenza from Nasopharyngeal swab specimens and should not be used as a sole basis for treatment. Nasal washings and aspirates are unacceptable for Xpert Xpress SARS-CoV-2/FLU/RSV testing.  Fact Sheet for Patients: EntrepreneurPulse.com.au  Fact Sheet for Healthcare Providers: IncredibleEmployment.be  This test is not yet approved or cleared by the [place] FDA and has been authorized for detection and/or diagnosis of SARS-CoV-2 by FDA under an Emergency Use Authorization (EUA). This EUA will remain in effect (meaning this test can be used) for the duration of the COVID-19 declaration under Section 564(b)(1) of the Act, 21 U.S.C. section 360bbb-3(b)(1), unless the authorization is terminated or revoked.     Resp Syncytial Virus by PCR NEGATIVE NEGATIVE Final    Comment: (NOTE) Fact Sheet for Patients: EntrepreneurPulse.com.au  Fact Sheet for Healthcare Providers: IncredibleEmployment.be  This test is not yet approved or cleared by the [place] FDA and has been authorized for detection and/or diagnosis of SARS-CoV-2 by FDA under an Emergency Use Authorization (EUA). This EUA will remain in effect (meaning this test can be used) for the duration of the COVID-19 declaration under Section 564(b)(1) of the Act, 21 U.S.C. section 360bbb-3(b)(1), unless the authorization is terminated or revoked.  Performed at  [facility], [place], [place] [address]   Respiratory (~20 pathogens) panel by PCR     Status: None   Collection Time: 04/05/22  2:07 PM   Specimen: Nasopharyngeal Swab; Respiratory  Result Value Ref Range Status   Adenovirus NOT DETECTED NOT DETECTED Final   Coronavirus 229E NOT DETECTED NOT DETECTED Final    Comment: (  NOTE) The Coronavirus on the Respiratory Panel, DOES NOT test for the novel  Coronavirus (2019 nCoV)    Coronavirus HKU1 NOT DETECTED NOT DETECTED Final   Coronavirus NL63 NOT DETECTED NOT DETECTED Final   Coronavirus OC43 NOT DETECTED NOT DETECTED Final   Metapneumovirus NOT DETECTED NOT DETECTED Final   Rhinovirus / Enterovirus NOT DETECTED NOT DETECTED Final   Influenza A NOT DETECTED NOT DETECTED Final   Influenza B NOT DETECTED NOT DETECTED Final   Parainfluenza Virus 1 NOT DETECTED NOT DETECTED Final   Parainfluenza Virus 2 NOT DETECTED NOT DETECTED Final   Parainfluenza Virus 3 NOT DETECTED NOT DETECTED Final   Parainfluenza Virus 4 NOT DETECTED NOT DETECTED Final   Respiratory Syncytial Virus NOT DETECTED NOT DETECTED Final   Bordetella pertussis NOT DETECTED NOT DETECTED Final   Bordetella Parapertussis NOT DETECTED NOT DETECTED Final   Chlamydophila pneumoniae NOT DETECTED NOT DETECTED Final   Mycoplasma pneumoniae NOT DETECTED NOT DETECTED Final    Comment: Performed at [facility], [place] [address], [place], [place] [address]  Culture, blood (Routine X 2) w Reflex to ID Panel     Status: None   Collection Time: 04/16/22  4:14 PM   Specimen: Left Antecubital; Blood  Result Value Ref Range Status   Specimen Description LEFT ANTECUBITAL  Final   Special Requests   Final    BOTTLES DRAWN AEROBIC AND ANAEROBIC Blood Culture adequate volume   Culture   Final    NO GROWTH 5 DAYS Performed at [facility], [address], [place], [place] [address]    Report Status 04/21/2022 FINAL  Final  Culture, blood (Routine  X 2) w Reflex to ID Panel     Status: None   Collection Time: 04/16/22  4:15 PM   Specimen: Right Antecubital; Blood  Result Value Ref Range Status   Specimen Description RIGHT ANTECUBITAL  Final   Special Requests   Final    BOTTLES DRAWN AEROBIC AND ANAEROBIC Blood Culture adequate volume   Culture   Final    NO GROWTH 5 DAYS Performed at [facility], [address], [place], [place] [address]    Report Status 04/21/2022 FINAL  Final  MRSA Next Gen by PCR, Nasal     Status: None   Collection Time: 04/16/22  5:02 PM   Specimen: Nasal Mucosa; Nasal Swab  Result Value Ref Range Status   MRSA by PCR Next Gen NOT DETECTED NOT DETECTED Final    Comment: (NOTE) The GeneXpert MRSA Assay (FDA approved for NASAL specimens only), is one component of a comprehensive MRSA colonization surveillance program. It is not intended to diagnose MRSA infection nor to guide or monitor treatment for MRSA infections. Test performance is not FDA approved in patients less than 79 years old. Performed at [facility], [place], [place] [address]   Respiratory (~20 pathogens) panel by PCR     Status: None   Collection Time: 04/16/22  5:02 PM   Specimen: Nasopharyngeal Swab; Respiratory  Result Value Ref Range Status   Adenovirus NOT DETECTED NOT DETECTED Final   Coronavirus 229E NOT DETECTED NOT DETECTED Final    Comment: (NOTE) The Coronavirus on the Respiratory Panel, DOES NOT test for the novel  Coronavirus (2019 nCoV)    Coronavirus HKU1 NOT DETECTED NOT DETECTED Final   Coronavirus NL63 NOT DETECTED NOT DETECTED Final   Coronavirus OC43 NOT DETECTED NOT DETECTED Final   Metapneumovirus NOT DETECTED NOT  DETECTED Final   Rhinovirus / Enterovirus NOT DETECTED NOT DETECTED Final   Influenza A NOT DETECTED NOT DETECTED Final   Influenza B NOT DETECTED NOT DETECTED Final   Parainfluenza Virus 1 NOT DETECTED NOT DETECTED Final   Parainfluenza Virus 2 NOT DETECTED  NOT DETECTED Final   Parainfluenza Virus 3 NOT DETECTED NOT DETECTED Final   Parainfluenza Virus 4 NOT DETECTED NOT DETECTED Final   Respiratory Syncytial Virus NOT DETECTED NOT DETECTED Final   Bordetella pertussis NOT DETECTED NOT DETECTED Final   Bordetella Parapertussis NOT DETECTED NOT DETECTED Final   Chlamydophila pneumoniae NOT DETECTED NOT DETECTED Final   Mycoplasma pneumoniae NOT DETECTED NOT DETECTED Final    Comment: Performed at [facility], [place] [address] [place], [place] [address]  Resp panel by RT-PCR (RSV, Flu A&B, Covid) Nasal Mucosa     Status: None   Collection Time: 04/16/22  5:02 PM   Specimen: Nasal Mucosa; Nasal Swab  Result Value Ref Range Status   SARS Coronavirus 2 by RT PCR NEGATIVE NEGATIVE Final    Comment: (NOTE) SARS-CoV-2 target nucleic acids are NOT DETECTED.  The SARS-CoV-2 RNA is generally detectable in upper respiratory specimens during the acute phase of infection. The lowest concentration of SARS-CoV-2 viral copies this assay can detect is 138 copies/mL. A negative result does not preclude SARS-Cov-2 infection and should not be used as the sole basis for treatment or other patient management decisions. A negative result may occur with  improper specimen collection/handling, submission of specimen other than nasopharyngeal swab, presence of viral mutation(s) within the areas targeted by this assay, and inadequate number of viral copies(<138 copies/mL). A negative result must be combined with clinical observations, patient history, and epidemiological information. The expected result is Negative.  Fact Sheet for Patients:  EntrepreneurPulse.com.au  Fact Sheet for Healthcare Providers:  IncredibleEmployment.be  This test is no t yet approved or cleared by the [place] FDA and  has been authorized for detection and/or diagnosis of SARS-CoV-2 by FDA under an Emergency Use Authorization (EUA). This  EUA will remain  in effect (meaning this test can be used) for the duration of the COVID-19 declaration under Section 564(b)(1) of the Act, 21 U.S.C.section 360bbb-3(b)(1), unless the authorization is terminated  or revoked sooner.       Influenza A by PCR NEGATIVE NEGATIVE Final   Influenza B by PCR NEGATIVE NEGATIVE Final    Comment: (NOTE) The Xpert Xpress SARS-CoV-2/FLU/RSV plus assay is intended as an aid in the diagnosis of influenza from Nasopharyngeal swab specimens and should not be used as a sole basis for treatment. Nasal washings and aspirates are unacceptable for Xpert Xpress SARS-CoV-2/FLU/RSV testing.  Fact Sheet for Patients: EntrepreneurPulse.com.au  Fact Sheet for Healthcare Providers: IncredibleEmployment.be  This test is not yet approved or cleared by the [place] FDA and has been authorized for detection and/or diagnosis of SARS-CoV-2 by FDA under an Emergency Use Authorization (EUA). This EUA will remain in effect (meaning this test can be used) for the duration of the COVID-19 declaration under Section 564(b)(1) of the Act, 21 U.S.C. section 360bbb-3(b)(1), unless the authorization is terminated or revoked.     Resp Syncytial Virus by PCR NEGATIVE NEGATIVE Final    Comment: (NOTE) Fact Sheet for Patients: EntrepreneurPulse.com.au  Fact Sheet for Healthcare Providers: IncredibleEmployment.be  This test is not yet approved or cleared by the [place] FDA and has been authorized for detection and/or diagnosis of SARS-CoV-2 by FDA under an Emergency  Use Authorization (EUA). This EUA will remain in effect (meaning this test can be used) for the duration of the COVID-19 declaration under Section 564(b)(1) of the Act, 21 U.S.C. section 360bbb-3(b)(1), unless the authorization is terminated or revoked.  Performed at [facility], [address] [place], [place]  [address]   Urine Culture     Status: None   Collection Time: 04/16/22  5:24 PM   Specimen: Urine, Random  Result Value Ref Range Status   Specimen Description   Final    URINE, RANDOM Performed at [facility], [address], [place], [place] [address]    Special Requests   Final    NONE Performed at [facility], [address], [place], [place] [address]    Culture   Final    NO GROWTH Performed at [facility], [place] [address], [place], [place] [address]    Report Status 04/17/2022 FINAL  Final   *Note: Due to a large number of results and/or encounters for the requested time period, some results have not been displayed. A complete set of results can be found in Results Review.    Labs: CBC: Recent Labs  Lab 04/18/22 0552 04/19/22 0458 04/20/22 0417  WBC 26.7* 23.9* 22.8*  HGB 14.5 14.1 13.2  HCT 46.6 45.4 43.4  MCV 94.0 94.8 97.1  PLT 228 211 497   Basic Metabolic Panel: Recent Labs  Lab 04/16/22 1420 04/17/22 0431 04/17/22 1708 04/18/22 0552 04/18/22 1833 04/19/22 0458 04/19/22 1753 04/20/22 0417  NA 152* 153*  --  155*  --  156*  --  148*  K 3.8 4.1  --  3.6  --  3.6  --  3.3*  CL 123* 122*  --  122*  --  121*  --  116*  CO2 23 24  --  21*  --  23  --  22  GLUCOSE 150* 151*  --  161*  --  144*  --  172*  BUN 38* 40*  --  55*  --  50*  --  51*  CREATININE 1.27* 1.40*  --  1.85*  --  1.81*  --  1.77*  CALCIUM 8.5* 8.3*  --  8.0*  --  7.9*  --  7.4*  MG  --  2.6*   < > 2.8* 2.8* 2.7* 2.6* 2.7*  PHOS 3.2 4.3   < > 4.6 3.8 4.0 4.1 3.9   < > = values in this interval not displayed.   Liver Function Tests: Recent Labs  Lab 04/16/22 1420 04/17/22 0431 04/18/22 0552  AST  --  99* 62*  ALT  --  30 44  ALKPHOS  --  142* 130*  BILITOT  --  1.4* 1.7*  PROT  --  6.2* 6.4*  ALBUMIN 2.3* 2.2* 2.1*   CBG: Recent Labs  Lab 04/20/22 0004 04/20/22 0354 04/20/22 0744 04/20/22 1311 04/20/22 1655  GLUCAP 162* 175* 163* 152* 148*     Discharge time spent: greater than 30 minutes.  Signed: Oran Rein, MD [facility] 05/21/22

## 2022-05-19 NOTE — Progress Notes (Signed)
Patient taken off the floor via transport to the morgue. Paperwork and upper dentures sent with patient. AC was notified as well.

## 2022-05-19 DEATH — deceased

## 2022-07-11 ENCOUNTER — Ambulatory Visit: Payer: HMO | Admitting: Dermatology

## 2023-01-25 ENCOUNTER — Ambulatory Visit: Payer: HMO | Admitting: Adult Health

## 2023-09-28 ENCOUNTER — Other Ambulatory Visit (HOSPITAL_COMMUNITY): Payer: Self-pay

## 2023-11-21 NOTE — Telephone Encounter (Signed)
 SABRA
